# Patient Record
Sex: Female | Born: 1945 | Race: White | Hispanic: No | Smoking: Never smoker
Health system: Southern US, Community
[De-identification: ages and names within clinical notes are randomized; demographics above are authoritative.]

## PROBLEM LIST (undated history)

## (undated) ENCOUNTER — Emergency Department (HOSPITAL_COMMUNITY): Disposition: A | Discharge status: Home / Self Care | Payer: Medicare Other

## (undated) DIAGNOSIS — I739 Peripheral vascular disease, unspecified: Secondary | ICD-10-CM

## (undated) DIAGNOSIS — R569 Unspecified convulsions: Secondary | ICD-10-CM

## (undated) DIAGNOSIS — M1990 Unspecified osteoarthritis, unspecified site: Secondary | ICD-10-CM

## (undated) DIAGNOSIS — R0600 Dyspnea, unspecified: Secondary | ICD-10-CM

## (undated) DIAGNOSIS — D509 Iron deficiency anemia, unspecified: Secondary | ICD-10-CM

## (undated) DIAGNOSIS — E039 Hypothyroidism, unspecified: Secondary | ICD-10-CM

## (undated) DIAGNOSIS — L0390 Cellulitis, unspecified: Secondary | ICD-10-CM

## (undated) DIAGNOSIS — K219 Gastro-esophageal reflux disease without esophagitis: Secondary | ICD-10-CM

## (undated) DIAGNOSIS — D649 Anemia, unspecified: Secondary | ICD-10-CM

## (undated) DIAGNOSIS — R0609 Other forms of dyspnea: Secondary | ICD-10-CM

## (undated) DIAGNOSIS — D519 Vitamin B12 deficiency anemia, unspecified: Secondary | ICD-10-CM

## (undated) DIAGNOSIS — E785 Hyperlipidemia, unspecified: Secondary | ICD-10-CM

## (undated) DIAGNOSIS — G5603 Carpal tunnel syndrome, bilateral upper limbs: Secondary | ICD-10-CM

## (undated) DIAGNOSIS — I350 Nonrheumatic aortic (valve) stenosis: Secondary | ICD-10-CM

## (undated) DIAGNOSIS — I209 Angina pectoris, unspecified: Secondary | ICD-10-CM

## (undated) DIAGNOSIS — R011 Cardiac murmur, unspecified: Secondary | ICD-10-CM

## (undated) DIAGNOSIS — M19072 Primary osteoarthritis, left ankle and foot: Secondary | ICD-10-CM

## (undated) DIAGNOSIS — W5501XA Bitten by cat, initial encounter: Secondary | ICD-10-CM

## (undated) DIAGNOSIS — Z87440 Personal history of urinary (tract) infections: Secondary | ICD-10-CM

## (undated) DIAGNOSIS — I219 Acute myocardial infarction, unspecified: Secondary | ICD-10-CM

## (undated) DIAGNOSIS — M869 Osteomyelitis, unspecified: Secondary | ICD-10-CM

## (undated) DIAGNOSIS — Z8709 Personal history of other diseases of the respiratory system: Secondary | ICD-10-CM

## (undated) DIAGNOSIS — M349 Systemic sclerosis, unspecified: Secondary | ICD-10-CM

## (undated) DIAGNOSIS — I509 Heart failure, unspecified: Secondary | ICD-10-CM

## (undated) DIAGNOSIS — I2510 Atherosclerotic heart disease of native coronary artery without angina pectoris: Secondary | ICD-10-CM

## (undated) DIAGNOSIS — M19071 Primary osteoarthritis, right ankle and foot: Secondary | ICD-10-CM

## (undated) DIAGNOSIS — Z5189 Encounter for other specified aftercare: Secondary | ICD-10-CM

## (undated) DIAGNOSIS — E109 Type 1 diabetes mellitus without complications: Secondary | ICD-10-CM

## (undated) DIAGNOSIS — K259 Gastric ulcer, unspecified as acute or chronic, without hemorrhage or perforation: Secondary | ICD-10-CM

## (undated) DIAGNOSIS — C50919 Malignant neoplasm of unspecified site of unspecified female breast: Secondary | ICD-10-CM

## (undated) DIAGNOSIS — IMO0001 Reserved for inherently not codable concepts without codable children: Secondary | ICD-10-CM

## (undated) DIAGNOSIS — I10 Essential (primary) hypertension: Secondary | ICD-10-CM

## (undated) HISTORY — DX: Systemic sclerosis, unspecified: M34.9

## (undated) HISTORY — PX: CARPAL TUNNEL RELEASE: SHX101

## (undated) HISTORY — PX: CARDIAC CATHETERIZATION: SHX172

## (undated) HISTORY — DX: Heart failure, unspecified: I50.9

## (undated) HISTORY — DX: Unspecified osteoarthritis, unspecified site: M19.90

## (undated) HISTORY — DX: Carpal tunnel syndrome, bilateral upper limbs: G56.03

## (undated) HISTORY — DX: Personal history of urinary (tract) infections: Z87.440

## (undated) HISTORY — DX: Bitten by cat, initial encounter: W55.01XA

## (undated) HISTORY — DX: Hyperlipidemia, unspecified: E78.5

## (undated) HISTORY — DX: Unspecified convulsions: R56.9

## (undated) HISTORY — PX: TRANSMETATARSAL AMPUTATION: SHX6197

## (undated) HISTORY — DX: Cardiac murmur, unspecified: R01.1

## (undated) HISTORY — PX: INCISION AND DRAINAGE OF WOUND: SHX1803

## (undated) HISTORY — DX: Peripheral vascular disease, unspecified: I73.9

## (undated) HISTORY — DX: Essential (primary) hypertension: I10

## (undated) HISTORY — PX: VITRECTOMY: SHX106

## (undated) HISTORY — DX: Anemia, unspecified: D64.9

## (undated) HISTORY — DX: Nonrheumatic aortic (valve) stenosis: I35.0

## (undated) HISTORY — PX: FEMORAL BYPASS: SHX50

## (undated) HISTORY — DX: Atherosclerotic heart disease of native coronary artery without angina pectoris: I25.10

## (undated) HISTORY — DX: Acute myocardial infarction, unspecified: I21.9

## (undated) HISTORY — DX: Vitamin B12 deficiency anemia, unspecified: D51.9

## (undated) HISTORY — DX: Hypothyroidism, unspecified: E03.9

## (undated) HISTORY — PX: BREAST BIOPSY: SHX20

---
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
## 1979-06-16 HISTORY — PX: TRIGGER FINGER RELEASE: SHX641

## 1982-10-15 HISTORY — PX: BREAST BIOPSY WITH SENTINEL LYMPH NODE BIOPSY AND NEEDLE LOCALIZATION: SHX5761

## 1982-10-15 HISTORY — PX: TUBAL LIGATION: SHX77

## 1982-11-15 HISTORY — PX: MASTECTOMY: SHX3

## 1984-10-15 DIAGNOSIS — I219 Acute myocardial infarction, unspecified: Secondary | ICD-10-CM

## 1984-10-15 HISTORY — DX: Acute myocardial infarction, unspecified: I21.9

## 1989-06-15 HISTORY — PX: CATARACT EXTRACTION W/ INTRAOCULAR LENS  IMPLANT, BILATERAL: SHX1307

## 1989-10-15 HISTORY — PX: APPENDECTOMY: SHX54

## 1990-10-15 HISTORY — PX: CORONARY ARTERY BYPASS GRAFT: SHX141

## 2000-01-05 ENCOUNTER — Encounter: Admission: RE | Admit: 2000-01-05 | Discharge: 2000-04-04 | Payer: Self-pay | Admitting: Internal Medicine

## 2002-06-30 ENCOUNTER — Encounter: Admission: RE | Admit: 2002-06-30 | Discharge: 2002-06-30 | Payer: Self-pay | Admitting: *Deleted

## 2002-06-30 ENCOUNTER — Encounter: Payer: Self-pay | Admitting: *Deleted

## 2002-07-16 ENCOUNTER — Other Ambulatory Visit: Admission: RE | Admit: 2002-07-16 | Discharge: 2002-07-16 | Payer: Self-pay | Admitting: *Deleted

## 2003-06-06 ENCOUNTER — Emergency Department (HOSPITAL_COMMUNITY): Admission: EM | Admit: 2003-06-06 | Discharge: 2003-06-06 | Payer: Self-pay | Admitting: Emergency Medicine

## 2003-06-30 ENCOUNTER — Other Ambulatory Visit: Admission: RE | Admit: 2003-06-30 | Discharge: 2003-06-30 | Payer: Self-pay | Admitting: *Deleted

## 2003-12-09 ENCOUNTER — Ambulatory Visit (HOSPITAL_COMMUNITY): Admission: RE | Admit: 2003-12-09 | Discharge: 2003-12-09 | Payer: Self-pay | Admitting: Endocrinology

## 2004-03-06 ENCOUNTER — Ambulatory Visit (HOSPITAL_COMMUNITY): Admission: RE | Admit: 2004-03-06 | Discharge: 2004-03-06 | Payer: Self-pay | Admitting: Endocrinology

## 2004-10-10 ENCOUNTER — Encounter: Admission: RE | Admit: 2004-10-10 | Discharge: 2004-10-10 | Payer: Self-pay | Admitting: Orthopedic Surgery

## 2006-03-03 ENCOUNTER — Emergency Department (HOSPITAL_COMMUNITY): Admission: EM | Admit: 2006-03-03 | Discharge: 2006-03-03 | Payer: Self-pay | Admitting: Emergency Medicine

## 2006-03-05 ENCOUNTER — Ambulatory Visit: Payer: Self-pay | Admitting: Hospitalist

## 2006-03-06 ENCOUNTER — Inpatient Hospital Stay (HOSPITAL_COMMUNITY): Admission: EM | Admit: 2006-03-06 | Discharge: 2006-03-11 | Payer: Self-pay | Admitting: Emergency Medicine

## 2006-03-07 ENCOUNTER — Ambulatory Visit: Payer: Self-pay | Admitting: Internal Medicine

## 2006-03-14 ENCOUNTER — Ambulatory Visit: Payer: Self-pay | Admitting: Endocrinology

## 2006-03-25 ENCOUNTER — Encounter (HOSPITAL_BASED_OUTPATIENT_CLINIC_OR_DEPARTMENT_OTHER): Admission: RE | Admit: 2006-03-25 | Discharge: 2006-05-22 | Payer: Self-pay | Admitting: Surgery

## 2006-04-01 ENCOUNTER — Ambulatory Visit: Payer: Self-pay | Admitting: Endocrinology

## 2006-04-01 ENCOUNTER — Ambulatory Visit: Payer: Self-pay | Admitting: Cardiology

## 2006-04-04 ENCOUNTER — Ambulatory Visit: Payer: Self-pay | Admitting: Endocrinology

## 2006-04-05 ENCOUNTER — Ambulatory Visit: Payer: Self-pay

## 2006-04-05 ENCOUNTER — Encounter: Payer: Self-pay | Admitting: Cardiology

## 2006-04-10 ENCOUNTER — Ambulatory Visit: Payer: Self-pay | Admitting: *Deleted

## 2006-05-02 ENCOUNTER — Ambulatory Visit: Payer: Self-pay | Admitting: *Deleted

## 2006-05-15 ENCOUNTER — Ambulatory Visit: Payer: Self-pay | Admitting: Endocrinology

## 2006-05-31 ENCOUNTER — Ambulatory Visit: Payer: Self-pay | Admitting: Endocrinology

## 2006-06-12 ENCOUNTER — Ambulatory Visit: Payer: Self-pay

## 2006-06-12 ENCOUNTER — Ambulatory Visit: Payer: Self-pay | Admitting: *Deleted

## 2006-06-12 ENCOUNTER — Encounter: Payer: Self-pay | Admitting: Cardiology

## 2006-06-20 ENCOUNTER — Ambulatory Visit: Payer: Self-pay | Admitting: Endocrinology

## 2006-08-12 ENCOUNTER — Ambulatory Visit: Payer: Self-pay | Admitting: Endocrinology

## 2006-08-12 LAB — CONVERTED CEMR LAB
Hgb A1c MFr Bld: 11 % — ABNORMAL HIGH (ref 4.6–6.0)
Iron: 63 ug/dL (ref 42–145)
MCHC: 34.3 g/dL (ref 30.0–36.0)
MCV: 91.9 fL (ref 78.0–100.0)
Platelets: 268 10*3/uL (ref 150–400)
RBC: 3.96 M/uL (ref 3.87–5.11)
RDW: 13 % (ref 11.5–14.6)
Saturation Ratios: 19.7 % — ABNORMAL LOW (ref 20.0–50.0)
WBC: 6.1 10*3/uL (ref 4.5–10.5)

## 2007-03-26 ENCOUNTER — Ambulatory Visit: Payer: Self-pay | Admitting: *Deleted

## 2007-04-22 ENCOUNTER — Ambulatory Visit: Payer: Self-pay | Admitting: *Deleted

## 2007-04-22 LAB — CONVERTED CEMR LAB
ALT: 12 units/L (ref 0–35)
AST: 14 units/L (ref 0–37)
Alkaline Phosphatase: 75 units/L (ref 39–117)
BUN: 10 mg/dL (ref 6–23)
Bilirubin, Direct: 0.1 mg/dL (ref 0.0–0.3)
CO2: 31 meq/L (ref 19–32)
Calcium: 9.1 mg/dL (ref 8.4–10.5)
Chloride: 109 meq/L (ref 96–112)
Cholesterol: 222 mg/dL (ref 0–200)
Creatinine, Ser: 1 mg/dL (ref 0.4–1.2)
GFR calc Af Amer: 72 mL/min
GFR calc non Af Amer: 60 mL/min
Glucose, Bld: 147 mg/dL — ABNORMAL HIGH (ref 70–99)
HDL: 33 mg/dL — ABNORMAL LOW (ref >39.0)
Potassium: 3.6 meq/L (ref 3.5–5.1)
Sodium: 143 meq/L (ref 135–145)
Total Bilirubin: 0.8 mg/dL (ref 0.3–1.2)
Total CHOL/HDL Ratio: 6.7
Total Protein: 6.3 g/dL (ref 6.0–8.3)
Triglycerides: 300 mg/dL (ref 0–149)

## 2007-07-02 ENCOUNTER — Ambulatory Visit: Payer: Self-pay

## 2007-07-02 LAB — CONVERTED CEMR LAB
ALT: 20 units/L (ref 0–35)
AST: 24 units/L (ref 0–37)
Albumin: 3.4 g/dL — ABNORMAL LOW (ref 3.5–5.2)
Alkaline Phosphatase: 78 units/L (ref 39–117)
BUN: 14 mg/dL (ref 6–23)
Bilirubin, Direct: 0.2 mg/dL (ref 0.0–0.3)
CO2: 31 meq/L (ref 19–32)
Calcium: 9.3 mg/dL (ref 8.4–10.5)
Chloride: 106 meq/L (ref 96–112)
Cholesterol: 131 mg/dL (ref 0–200)
Creatinine, Ser: 1.1 mg/dL (ref 0.4–1.2)
GFR calc Af Amer: 65 mL/min
GFR calc non Af Amer: 53 mL/min
Glucose, Bld: 235 mg/dL — ABNORMAL HIGH (ref 70–99)
HDL: 36.5 mg/dL — ABNORMAL LOW (ref >39.0)
LDL Cholesterol: 71 mg/dL (ref 0–99)
Potassium: 4.3 meq/L (ref 3.5–5.1)
Sodium: 142 meq/L (ref 135–145)
Total Bilirubin: 1 mg/dL (ref 0.3–1.2)
Total CHOL/HDL Ratio: 3.6
Total Protein: 6.6 g/dL (ref 6.0–8.3)
Triglycerides: 117 mg/dL (ref 0–149)
VLDL: 23 mg/dL (ref 0–40)

## 2007-07-07 ENCOUNTER — Ambulatory Visit: Payer: Self-pay | Admitting: Internal Medicine

## 2007-10-16 DIAGNOSIS — W5501XA Bitten by cat, initial encounter: Secondary | ICD-10-CM

## 2007-10-16 HISTORY — DX: Bitten by cat, initial encounter: W55.01XA

## 2007-10-16 HISTORY — PX: INCISION AND DRAINAGE OF WOUND: SHX1803

## 2007-12-26 ENCOUNTER — Ambulatory Visit: Payer: Self-pay | Admitting: Internal Medicine

## 2007-12-26 LAB — CONVERTED CEMR LAB
ALT: 17 units/L (ref 0–35)
AST: 21 units/L (ref 0–37)
Basophils Relative: 0.6 % (ref 0.0–1.0)
CO2: 28 meq/L (ref 19–32)
Calcium: 9.4 mg/dL (ref 8.4–10.5)
Chloride: 106 meq/L (ref 96–112)
Creatinine, Ser: 1 mg/dL (ref 0.4–1.2)
Eosinophils Absolute: 0.3 10*3/uL (ref 0.0–0.6)
Eosinophils Relative: 4.2 % (ref 0.0–5.0)
GFR calc non Af Amer: 60 mL/min
Glucose, Bld: 83 mg/dL (ref 70–99)
HCT: 36.6 % (ref 36.0–46.0)
Hemoglobin: 12.5 g/dL (ref 12.0–15.0)
MCV: 89.5 fL (ref 78.0–100.0)
Monocytes Absolute: 0.7 10*3/uL (ref 0.2–0.7)
Monocytes Relative: 10.2 % (ref 3.0–11.0)
Neutro Abs: 4.6 10*3/uL (ref 1.4–7.7)
Neutrophils Relative %: 66.7 % (ref 43.0–77.0)
Platelets: 266 10*3/uL (ref 150–400)
Potassium: 3.6 meq/L (ref 3.5–5.1)
RBC: 4.09 M/uL (ref 3.87–5.11)
RDW: 12.6 % (ref 11.5–14.6)
WBC: 6.9 10*3/uL (ref 4.5–10.5)

## 2008-04-26 ENCOUNTER — Ambulatory Visit: Payer: Self-pay | Admitting: Cardiovascular Disease

## 2008-05-06 ENCOUNTER — Ambulatory Visit: Payer: Self-pay

## 2008-05-07 ENCOUNTER — Ambulatory Visit: Payer: Self-pay

## 2008-05-07 ENCOUNTER — Ambulatory Visit: Payer: Self-pay | Admitting: Cardiovascular Disease

## 2008-05-07 LAB — CONVERTED CEMR LAB
BUN: 22 mg/dL (ref 6–23)
CO2: 31 meq/L (ref 19–32)
Calcium: 9.1 mg/dL (ref 8.4–10.5)
Chloride: 98 meq/L (ref 96–112)
Creatinine, Ser: 1.2 mg/dL (ref 0.4–1.2)
GFR calc Af Amer: 59 mL/min
GFR calc non Af Amer: 48 mL/min
Glucose, Bld: 219 mg/dL — ABNORMAL HIGH (ref 70–99)
Potassium: 4.3 meq/L (ref 3.5–5.1)
Sodium: 136 meq/L (ref 135–145)

## 2008-05-12 ENCOUNTER — Ambulatory Visit: Payer: Self-pay | Admitting: Cardiovascular Disease

## 2008-05-30 ENCOUNTER — Emergency Department (HOSPITAL_COMMUNITY): Admission: EM | Admit: 2008-05-30 | Discharge: 2008-05-30 | Payer: Self-pay | Admitting: Family Medicine

## 2008-06-12 ENCOUNTER — Inpatient Hospital Stay (HOSPITAL_COMMUNITY): Admission: RE | Admit: 2008-06-12 | Discharge: 2008-06-14 | Payer: Self-pay | Admitting: Orthopedic Surgery

## 2008-06-23 ENCOUNTER — Inpatient Hospital Stay (HOSPITAL_COMMUNITY): Admission: RE | Admit: 2008-06-23 | Discharge: 2008-06-29 | Payer: Self-pay | Admitting: Orthopedic Surgery

## 2008-10-15 HISTORY — PX: CARDIAC CATHETERIZATION: SHX172

## 2008-12-20 ENCOUNTER — Ambulatory Visit: Payer: Self-pay | Admitting: Internal Medicine

## 2008-12-21 ENCOUNTER — Ambulatory Visit: Payer: Self-pay

## 2008-12-21 ENCOUNTER — Encounter: Payer: Self-pay | Admitting: Internal Medicine

## 2008-12-30 ENCOUNTER — Ambulatory Visit: Payer: Self-pay

## 2009-01-07 ENCOUNTER — Ambulatory Visit: Payer: Self-pay | Admitting: Cardiology

## 2009-01-07 LAB — CONVERTED CEMR LAB
ALT: 27 units/L (ref 0–35)
AST: 23 units/L (ref 0–37)
Albumin: 3.2 g/dL — ABNORMAL LOW (ref 3.5–5.2)
Alkaline Phosphatase: 84 units/L (ref 39–117)
BUN: 25 mg/dL — ABNORMAL HIGH (ref 6–23)
Basophils Absolute: 0 10*3/uL (ref 0.0–0.1)
Basophils Relative: 0 % (ref 0.0–3.0)
Bilirubin, Direct: 0.1 mg/dL (ref 0.0–0.3)
CO2: 32 meq/L (ref 19–32)
Calcium: 9.1 mg/dL (ref 8.4–10.5)
Chloride: 105 meq/L (ref 96–112)
Creatinine, Ser: 1.3 mg/dL — ABNORMAL HIGH (ref 0.4–1.2)
Eosinophils Absolute: 2.3 10*3/uL — ABNORMAL HIGH (ref 0.0–0.7)
Eosinophils Relative: 21.5 % — ABNORMAL HIGH (ref 0.0–5.0)
GFR calc non Af Amer: 43.9 mL/min (ref >60)
Glucose, Bld: 205 mg/dL — ABNORMAL HIGH (ref 70–99)
HCT: 36.9 % (ref 36.0–46.0)
Hemoglobin: 12.7 g/dL (ref 12.0–15.0)
INR: 1 (ref 0.8–1.0)
Lymphocytes Relative: 14.2 % (ref 12.0–46.0)
Lymphs Abs: 1.5 10*3/uL (ref 0.7–4.0)
MCHC: 34.3 g/dL (ref 30.0–36.0)
MCV: 90.1 fL (ref 78.0–100.0)
Monocytes Absolute: 1 10*3/uL (ref 0.1–1.0)
Monocytes Relative: 9.4 % (ref 3.0–12.0)
Neutro Abs: 5.8 10*3/uL (ref 1.4–7.7)
Neutrophils Relative %: 54.9 % (ref 43.0–77.0)
Platelets: 199 10*3/uL (ref 150.0–400.0)
Potassium: 4.2 meq/L (ref 3.5–5.1)
Prothrombin Time: 10.7 s — ABNORMAL LOW (ref 10.9–13.3)
RBC: 4.1 M/uL (ref 3.87–5.11)
RDW: 13.6 % (ref 11.5–14.6)
Sodium: 141 meq/L (ref 135–145)
TSH: 0.56 microintl units/mL (ref 0.35–5.50)
Total Bilirubin: 1.2 mg/dL (ref 0.3–1.2)
Total Protein: 6.5 g/dL (ref 6.0–8.3)
WBC: 10.6 10*3/uL — ABNORMAL HIGH (ref 4.5–10.5)
aPTT: 30.3 s — ABNORMAL HIGH (ref 21.7–28.8)

## 2009-01-13 ENCOUNTER — Inpatient Hospital Stay (HOSPITAL_BASED_OUTPATIENT_CLINIC_OR_DEPARTMENT_OTHER): Admission: RE | Admit: 2009-01-13 | Discharge: 2009-01-13 | Payer: Self-pay | Admitting: Cardiology

## 2009-01-13 ENCOUNTER — Ambulatory Visit: Payer: Self-pay | Admitting: Cardiology

## 2009-02-01 DIAGNOSIS — E785 Hyperlipidemia, unspecified: Secondary | ICD-10-CM

## 2009-02-01 DIAGNOSIS — I10 Essential (primary) hypertension: Secondary | ICD-10-CM

## 2009-02-01 DIAGNOSIS — E039 Hypothyroidism, unspecified: Secondary | ICD-10-CM | POA: Insufficient documentation

## 2009-02-03 ENCOUNTER — Ambulatory Visit: Payer: Self-pay | Admitting: Internal Medicine

## 2009-02-03 ENCOUNTER — Encounter: Payer: Self-pay | Admitting: Internal Medicine

## 2009-02-03 DIAGNOSIS — I2510 Atherosclerotic heart disease of native coronary artery without angina pectoris: Secondary | ICD-10-CM

## 2009-02-03 DIAGNOSIS — I6529 Occlusion and stenosis of unspecified carotid artery: Secondary | ICD-10-CM

## 2009-02-03 DIAGNOSIS — E1165 Type 2 diabetes mellitus with hyperglycemia: Secondary | ICD-10-CM

## 2009-02-03 DIAGNOSIS — E1151 Type 2 diabetes mellitus with diabetic peripheral angiopathy without gangrene: Secondary | ICD-10-CM

## 2009-02-16 ENCOUNTER — Ambulatory Visit: Payer: Self-pay

## 2009-02-16 ENCOUNTER — Encounter: Payer: Self-pay | Admitting: Internal Medicine

## 2009-04-04 ENCOUNTER — Encounter (HOSPITAL_BASED_OUTPATIENT_CLINIC_OR_DEPARTMENT_OTHER): Admission: RE | Admit: 2009-04-04 | Discharge: 2009-04-26 | Payer: Self-pay | Admitting: General Surgery

## 2009-04-05 ENCOUNTER — Telehealth: Payer: Self-pay | Admitting: Internal Medicine

## 2009-04-06 ENCOUNTER — Ambulatory Visit: Payer: Self-pay | Admitting: Vascular Surgery

## 2009-04-11 ENCOUNTER — Ambulatory Visit: Payer: Self-pay | Admitting: Vascular Surgery

## 2009-04-11 ENCOUNTER — Ambulatory Visit (HOSPITAL_COMMUNITY): Admission: RE | Admit: 2009-04-11 | Discharge: 2009-04-11 | Payer: Self-pay | Admitting: Vascular Surgery

## 2009-04-11 ENCOUNTER — Telehealth: Payer: Self-pay | Admitting: Internal Medicine

## 2009-04-13 ENCOUNTER — Ambulatory Visit: Payer: Self-pay | Admitting: Vascular Surgery

## 2009-04-19 ENCOUNTER — Inpatient Hospital Stay (HOSPITAL_COMMUNITY): Admission: RE | Admit: 2009-04-19 | Discharge: 2009-04-26 | Payer: Self-pay | Admitting: Vascular Surgery

## 2009-04-19 ENCOUNTER — Ambulatory Visit: Payer: Self-pay | Admitting: Cardiology

## 2009-04-19 ENCOUNTER — Encounter: Payer: Self-pay | Admitting: Vascular Surgery

## 2009-04-20 ENCOUNTER — Encounter: Payer: Self-pay | Admitting: Cardiology

## 2009-04-21 ENCOUNTER — Ambulatory Visit: Payer: Self-pay | Admitting: Vascular Surgery

## 2009-04-21 ENCOUNTER — Encounter: Payer: Self-pay | Admitting: Vascular Surgery

## 2009-05-06 ENCOUNTER — Telehealth: Payer: Self-pay | Admitting: Cardiovascular Disease

## 2009-05-11 ENCOUNTER — Emergency Department (HOSPITAL_COMMUNITY): Admission: EM | Admit: 2009-05-11 | Discharge: 2009-05-11 | Payer: Self-pay | Admitting: Emergency Medicine

## 2009-05-11 ENCOUNTER — Ambulatory Visit: Payer: Self-pay | Admitting: Vascular Surgery

## 2009-05-18 ENCOUNTER — Ambulatory Visit: Payer: Self-pay | Admitting: Vascular Surgery

## 2009-05-27 ENCOUNTER — Ambulatory Visit: Payer: Self-pay | Admitting: Vascular Surgery

## 2009-06-03 ENCOUNTER — Ambulatory Visit: Payer: Self-pay | Admitting: Vascular Surgery

## 2009-06-06 ENCOUNTER — Ambulatory Visit: Payer: Self-pay | Admitting: Internal Medicine

## 2009-06-06 DIAGNOSIS — D599 Acquired hemolytic anemia, unspecified: Secondary | ICD-10-CM

## 2009-06-07 LAB — CONVERTED CEMR LAB
BUN: 30 mg/dL — ABNORMAL HIGH (ref 6–23)
Basophils Absolute: 0 10*3/uL (ref 0.0–0.1)
Basophils Relative: 0.1 % (ref 0.0–3.0)
CO2: 28 meq/L (ref 19–32)
Calcium: 9 mg/dL (ref 8.4–10.5)
Chloride: 100 meq/L (ref 96–112)
Creatinine, Ser: 1.3 mg/dL — ABNORMAL HIGH (ref 0.4–1.2)
Eosinophils Absolute: 0.4 10*3/uL (ref 0.0–0.7)
Eosinophils Relative: 4.2 % (ref 0.0–5.0)
Ferritin: 7.4 ng/mL — ABNORMAL LOW (ref 10.0–291.0)
GFR calc non Af Amer: 43.84 mL/min (ref >60)
Glucose, Bld: 325 mg/dL — ABNORMAL HIGH (ref 70–99)
HCT: 24.3 % — ABNORMAL LOW (ref 36.0–46.0)
Hemoglobin: 8 g/dL — ABNORMAL LOW (ref 12.0–15.0)
INR: 1 (ref 0.8–1.0)
Iron: 145 ug/dL (ref 42–145)
Lymphocytes Relative: 13.1 % (ref 12.0–46.0)
Lymphs Abs: 1.1 10*3/uL (ref 0.7–4.0)
MCHC: 32.9 g/dL (ref 30.0–36.0)
MCV: 82.4 fL (ref 78.0–100.0)
Monocytes Absolute: 0.9 10*3/uL (ref 0.1–1.0)
Monocytes Relative: 10.8 % (ref 3.0–12.0)
Neutro Abs: 6.3 10*3/uL (ref 1.4–7.7)
Neutrophils Relative %: 71.8 % (ref 43.0–77.0)
Platelets: 299 10*3/uL (ref 150.0–400.0)
Potassium: 5 meq/L (ref 3.5–5.1)
Pro B Natriuretic peptide (BNP): 406 pg/mL — ABNORMAL HIGH (ref 0.0–100.0)
Prothrombin Time: 10.7 s (ref 9.1–11.7)
RBC: 2.93 M/uL — ABNORMAL LOW (ref 3.87–5.11)
RDW: 15.4 % — ABNORMAL HIGH (ref 11.5–14.6)
Saturation Ratios: 39 % (ref 20.0–50.0)
Sodium: 134 meq/L — ABNORMAL LOW (ref 135–145)
Transferrin: 265.8 mg/dL (ref 212.0–360.0)
WBC: 8.7 10*3/uL (ref 4.5–10.5)
aPTT: 28.9 s — ABNORMAL HIGH (ref 21.7–28.8)

## 2009-06-08 ENCOUNTER — Telehealth (INDEPENDENT_AMBULATORY_CARE_PROVIDER_SITE_OTHER): Payer: Self-pay | Admitting: *Deleted

## 2009-06-09 ENCOUNTER — Ambulatory Visit: Payer: Self-pay | Admitting: Vascular Surgery

## 2009-06-09 ENCOUNTER — Ambulatory Visit (HOSPITAL_COMMUNITY): Admission: RE | Admit: 2009-06-09 | Discharge: 2009-06-09 | Payer: Self-pay | Admitting: Vascular Surgery

## 2009-06-17 ENCOUNTER — Ambulatory Visit: Payer: Self-pay | Admitting: Vascular Surgery

## 2009-06-22 ENCOUNTER — Ambulatory Visit: Payer: Self-pay | Admitting: Internal Medicine

## 2009-06-24 ENCOUNTER — Ambulatory Visit: Payer: Self-pay | Admitting: Vascular Surgery

## 2009-06-27 ENCOUNTER — Telehealth: Payer: Self-pay | Admitting: Internal Medicine

## 2009-06-27 ENCOUNTER — Ambulatory Visit: Payer: Self-pay | Admitting: Surgery

## 2009-06-27 ENCOUNTER — Inpatient Hospital Stay (HOSPITAL_COMMUNITY): Admission: AD | Admit: 2009-06-27 | Discharge: 2009-07-08 | Payer: Self-pay | Admitting: Surgery

## 2009-06-27 LAB — CONVERTED CEMR LAB
Basophils Absolute: 0.1 10*3/uL (ref 0.0–0.1)
Basophils Relative: 0.6 % (ref 0.0–3.0)
Eosinophils Absolute: 0.3 10*3/uL (ref 0.0–0.7)
Eosinophils Relative: 2.9 % (ref 0.0–5.0)
HCT: 26.7 % — ABNORMAL LOW (ref 36.0–46.0)
Hemoglobin: 8.6 g/dL — ABNORMAL LOW (ref 12.0–15.0)
Lymphocytes Relative: 10.8 % — ABNORMAL LOW (ref 12.0–46.0)
Lymphs Abs: 1.1 10*3/uL (ref 0.7–4.0)
MCHC: 32.3 g/dL (ref 30.0–36.0)
MCV: 84.5 fL (ref 78.0–100.0)
Monocytes Absolute: 0.2 10*3/uL (ref 0.1–1.0)
Monocytes Relative: 2.4 % — ABNORMAL LOW (ref 3.0–12.0)
Neutro Abs: 8.7 10*3/uL — ABNORMAL HIGH (ref 1.4–7.7)
Neutrophils Relative %: 83.3 % — ABNORMAL HIGH (ref 43.0–77.0)
Platelets: 376 10*3/uL (ref 150.0–400.0)
RBC: 3.16 M/uL — ABNORMAL LOW (ref 3.87–5.11)
RDW: 18.2 % — ABNORMAL HIGH (ref 11.5–14.6)
TSH: 0.14 microintl units/mL — ABNORMAL LOW (ref 0.35–5.50)
WBC: 10.4 10*3/uL (ref 4.5–10.5)

## 2009-06-28 ENCOUNTER — Telehealth: Payer: Self-pay | Admitting: Internal Medicine

## 2009-06-30 ENCOUNTER — Encounter: Payer: Self-pay | Admitting: Surgery

## 2009-07-06 ENCOUNTER — Ambulatory Visit: Payer: Self-pay | Admitting: Infectious Diseases

## 2009-07-22 ENCOUNTER — Encounter: Payer: Self-pay | Admitting: Cardiovascular Disease

## 2009-07-22 ENCOUNTER — Ambulatory Visit: Payer: Self-pay | Admitting: Vascular Surgery

## 2009-08-12 ENCOUNTER — Ambulatory Visit: Payer: Self-pay | Admitting: Vascular Surgery

## 2009-09-05 ENCOUNTER — Ambulatory Visit: Payer: Self-pay | Admitting: Vascular Surgery

## 2009-09-16 ENCOUNTER — Ambulatory Visit: Payer: Self-pay | Admitting: Vascular Surgery

## 2009-09-23 ENCOUNTER — Ambulatory Visit: Payer: Self-pay | Admitting: Vascular Surgery

## 2009-09-26 ENCOUNTER — Ambulatory Visit: Payer: Self-pay | Admitting: Internal Medicine

## 2009-10-04 ENCOUNTER — Ambulatory Visit: Payer: Self-pay | Admitting: Vascular Surgery

## 2009-10-10 ENCOUNTER — Ambulatory Visit: Payer: Self-pay | Admitting: Vascular Surgery

## 2009-10-21 ENCOUNTER — Ambulatory Visit: Payer: Self-pay | Admitting: Vascular Surgery

## 2009-10-25 ENCOUNTER — Encounter: Payer: Self-pay | Admitting: Vascular Surgery

## 2009-10-25 ENCOUNTER — Inpatient Hospital Stay (HOSPITAL_COMMUNITY): Admission: RE | Admit: 2009-10-25 | Discharge: 2009-10-26 | Payer: Self-pay | Admitting: Vascular Surgery

## 2009-10-25 ENCOUNTER — Ambulatory Visit: Payer: Self-pay | Admitting: Vascular Surgery

## 2009-11-01 ENCOUNTER — Ambulatory Visit (HOSPITAL_COMMUNITY): Admission: RE | Admit: 2009-11-01 | Discharge: 2009-11-01 | Payer: Self-pay | Admitting: Vascular Surgery

## 2009-11-04 ENCOUNTER — Ambulatory Visit: Payer: Self-pay | Admitting: Vascular Surgery

## 2009-11-04 ENCOUNTER — Encounter: Payer: Self-pay | Admitting: Internal Medicine

## 2009-11-10 ENCOUNTER — Encounter (INDEPENDENT_AMBULATORY_CARE_PROVIDER_SITE_OTHER): Payer: Self-pay | Admitting: *Deleted

## 2009-11-17 ENCOUNTER — Ambulatory Visit: Payer: Self-pay | Admitting: Internal Medicine

## 2009-11-18 ENCOUNTER — Ambulatory Visit: Payer: Self-pay | Admitting: Vascular Surgery

## 2009-11-22 LAB — CONVERTED CEMR LAB
AST: 20 units/L (ref 0–37)
BUN: 11 mg/dL (ref 6–23)
Basophils Absolute: 0 10*3/uL (ref 0.0–0.1)
Basophils Relative: 0 % (ref 0.0–3.0)
CO2: 31 meq/L (ref 19–32)
Calcium: 9 mg/dL (ref 8.4–10.5)
Chloride: 102 meq/L (ref 96–112)
Cholesterol: 114 mg/dL (ref 0–200)
Creatinine, Ser: 0.9 mg/dL (ref 0.4–1.2)
Eosinophils Absolute: 1.3 10*3/uL — ABNORMAL HIGH (ref 0.0–0.7)
Eosinophils Relative: 18.5 % — ABNORMAL HIGH (ref 0.0–5.0)
GFR calc non Af Amer: 66.92 mL/min (ref >60)
Glucose, Bld: 128 mg/dL — ABNORMAL HIGH (ref 70–99)
HCT: 32.6 % — ABNORMAL LOW (ref 36.0–46.0)
HDL: 46.1 mg/dL (ref >39.00)
Hemoglobin: 10.7 g/dL — ABNORMAL LOW (ref 12.0–15.0)
Iron: 42 ug/dL (ref 42–145)
LDL Cholesterol: 52 mg/dL (ref 0–99)
Lymphocytes Relative: 15.3 % (ref 12.0–46.0)
Lymphs Abs: 1.1 10*3/uL (ref 0.7–4.0)
MCHC: 32.8 g/dL (ref 30.0–36.0)
MCV: 91.3 fL (ref 78.0–100.0)
Monocytes Absolute: 0.7 10*3/uL (ref 0.1–1.0)
Monocytes Relative: 10.2 % (ref 3.0–12.0)
Neutro Abs: 3.9 10*3/uL (ref 1.4–7.7)
Neutrophils Relative %: 56 % (ref 43.0–77.0)
Platelets: 219 10*3/uL (ref 150.0–400.0)
Potassium: 3.5 meq/L (ref 3.5–5.1)
RBC: 3.57 M/uL — ABNORMAL LOW (ref 3.87–5.11)
RDW: 14.8 % — ABNORMAL HIGH (ref 11.5–14.6)
Sodium: 140 meq/L (ref 135–145)
Total CHOL/HDL Ratio: 2
Triglycerides: 81 mg/dL (ref 0.0–149.0)
VLDL: 16.2 mg/dL (ref 0.0–40.0)
WBC: 7 10*3/uL (ref 4.5–10.5)

## 2009-12-02 ENCOUNTER — Encounter: Payer: Self-pay | Admitting: Internal Medicine

## 2009-12-02 ENCOUNTER — Ambulatory Visit: Payer: Self-pay | Admitting: Vascular Surgery

## 2009-12-30 ENCOUNTER — Ambulatory Visit: Payer: Self-pay | Admitting: Vascular Surgery

## 2010-01-20 ENCOUNTER — Ambulatory Visit: Payer: Self-pay | Admitting: Vascular Surgery

## 2010-02-02 ENCOUNTER — Encounter: Payer: Self-pay | Admitting: Internal Medicine

## 2010-02-17 ENCOUNTER — Telehealth: Payer: Self-pay | Admitting: Internal Medicine

## 2010-02-24 ENCOUNTER — Ambulatory Visit: Payer: Self-pay | Admitting: Vascular Surgery

## 2010-04-24 ENCOUNTER — Ambulatory Visit: Payer: Self-pay | Admitting: Internal Medicine

## 2010-04-25 LAB — CONVERTED CEMR LAB
BUN: 17 mg/dL (ref 6–23)
Basophils Absolute: 0.1 10*3/uL (ref 0.0–0.1)
Basophils Relative: 0.9 % (ref 0.0–3.0)
CO2: 28 meq/L (ref 19–32)
Calcium: 8.7 mg/dL (ref 8.4–10.5)
Chloride: 108 meq/L (ref 96–112)
Creatinine, Ser: 1.1 mg/dL (ref 0.4–1.2)
Eosinophils Absolute: 0.3 10*3/uL (ref 0.0–0.7)
Eosinophils Relative: 5 % (ref 0.0–5.0)
GFR calc non Af Amer: 55.94 mL/min (ref >60)
Glucose, Bld: 50 mg/dL — ABNORMAL LOW (ref 70–99)
HCT: 33.8 % — ABNORMAL LOW (ref 36.0–46.0)
Hemoglobin: 11.4 g/dL — ABNORMAL LOW (ref 12.0–15.0)
Lymphocytes Relative: 19.6 % (ref 12.0–46.0)
Lymphs Abs: 1.2 10*3/uL (ref 0.7–4.0)
MCHC: 33.6 g/dL (ref 30.0–36.0)
MCV: 86.6 fL (ref 78.0–100.0)
Monocytes Absolute: 0.7 10*3/uL (ref 0.1–1.0)
Monocytes Relative: 11.7 % (ref 3.0–12.0)
Neutro Abs: 3.7 10*3/uL (ref 1.4–7.7)
Neutrophils Relative %: 62.8 % (ref 43.0–77.0)
Platelets: 235 10*3/uL (ref 150.0–400.0)
Potassium: 3.9 meq/L (ref 3.5–5.1)
Pro B Natriuretic peptide (BNP): 421.6 pg/mL — ABNORMAL HIGH (ref 0.0–100.0)
RBC: 3.91 M/uL (ref 3.87–5.11)
RDW: 16.7 % — ABNORMAL HIGH (ref 11.5–14.6)
Sodium: 142 meq/L (ref 135–145)
TSH: 32.21 microintl units/mL — ABNORMAL HIGH (ref 0.35–5.50)
WBC: 5.9 10*3/uL (ref 4.5–10.5)

## 2010-04-26 ENCOUNTER — Telehealth: Payer: Self-pay | Admitting: Internal Medicine

## 2010-04-28 ENCOUNTER — Encounter: Payer: Self-pay | Admitting: Internal Medicine

## 2010-05-05 ENCOUNTER — Encounter: Payer: Self-pay | Admitting: Internal Medicine

## 2010-05-05 ENCOUNTER — Ambulatory Visit: Payer: Self-pay | Admitting: Vascular Surgery

## 2010-11-04 ENCOUNTER — Encounter: Payer: Self-pay | Admitting: Endocrinology

## 2010-11-14 NOTE — Letter (Signed)
Summary: [facility]   [facility]   Imported By: Roderic Ovens 12/22/2009 14:33:17  _____________________________________________________________________  External Attachment:    Type:   Image     Comment:   External Document

## 2010-11-14 NOTE — Progress Notes (Signed)
Summary: medication not working  Phone Note Call from Patient Call back at [phone]   Caller: Patient Reason for Call: Talk to Nurse, Talk to Doctor Summary of Call: pt has a medication change and she wants to discuss the lasix because it is not working Initial call taken by: Omer Jack,  April 26, 2010 11:50 AM  Follow-up for Phone Call        Spoke with pt who reports when she saw [name] on 7/11 Lasix was increased from 40 mg daily to 60 mg daily.  She took increased dose yesterday AM and was "miserable all day". States she had difficulty getting to bathroom due to urgency and it was very difficult for her to work.  Pt reports no change in SOB, wt or swelling since seeing [name].  Asking if she can change to Lasix 40 mg by mouth two times a day as she has done in past when needed.  I instructed pt to take Lasix 40 mg in AM and half 40 mg tab in afternoon to see if this would help with urgency. I asked her to let us know if this did not work. Pt agreeable with plan.  She is aware of scheduled follow up BMP. Follow-up by: Dossie Arbour, RN, BSN,  April 26, 2010 12:18 PM

## 2010-11-14 NOTE — Letter (Signed)
Summary: [facility]   [facility]   Imported By: Roderic Ovens 12/07/2009 09:45:38  _____________________________________________________________________  External Attachment:    Type:   Image     Comment:   External Document

## 2010-11-14 NOTE — Progress Notes (Signed)
  Phone Note Call from Patient Call back at [facility] Phone [phone]   Caller: Patient Summary of Call: pt needs scripts sent to [facility] [phone]. please [facility]aramacy [facility] [place] IN THE COMPUTER in EMR. pt very upset. Initial call taken by: Edman Circle,  Feb 17, 2010 12:19 PM  Follow-up for Phone Call        Sent meds to correct pharmacy. Follow-up by: J REISS RN    Prescriptions: ZETIA 10 MG TABS (EZETIMIBE) Take 1 tablet by mouth once a day  #30 x 6   Entered by:   Layne Benton, RN, BSN   Authorized by:   Sherrill Raring, MD, [facility]   Signed by:   Layne Benton, RN, BSN on 02/17/2010   Method used:   Electronically to        [facility] [phone]* (retail)       [address] [place], [place]  [address]       Ph: [phone] or [phone]       Fax: [phone]   RxID:   [id number] LIPITOR 40 MG TABS (ATORVASTATIN CALCIUM) Take 1 tablet by mouth at bedtime  #30 x 6   Entered by:   Layne Benton, RN, BSN   Authorized by:   Sherrill Raring, MD, [facility]   Signed by:   Layne Benton, RN, BSN on 02/17/2010   Method used:   Electronically to        [facility] [phone]* (retail)       [address] [place], [place]  [address]       Ph: [phone] or [phone]       Fax: [phone]   RxID:   [id number]

## 2010-11-14 NOTE — Letter (Signed)
Summary: [facility] Progress Note  [facility] Progress Note   Imported By: Roderic Ovens 03/09/2010 15:20:27  _____________________________________________________________________  External Attachment:    Type:   Image     Comment:   External Document

## 2010-11-14 NOTE — Assessment & Plan Note (Signed)
Summary: eph/per dr wall/jss   Primary Provider:  dr. Talmage Nap    History of Present Illness: Patient is a 65 year old with a history of CAD.  (last catheterizaiont:  LIMA to LAD patient; SVG to OM 100%, SVG to LCx patent patient but Cx with 70 to 80% narroing).  I last saw her in clinic 09/23/2009 Since seen, she has undergone surgery on her foot.  She was started on IV ABx through a PICC line a few wks ago (Primaxin q 6 hrs).  It was after starting this that she noted occasional chest pressure, not associated with activity.  SHe is able to walk stairs without a problem.  She does say thart if she really hurries with walking she will have it.  No episodes wake her from sleep.  She says the spells are different from her previous angina.  Current Medications (verified): 1)  Accupril 10 Mg Tabs (Quinapril Hcl) .... Take 1 Tablet By Mouth Once A Day 2)  Lantus 100 Unit/ml Soln (Insulin Glargine) .... Use As Directed 3)  Lipitor 40 Mg Tabs (Atorvastatin Calcium) .... Take 1 Tablet By Mouth At Bedtime 4)  Metoprolol Succinate 25 Mg Xr24h-Tab (Metoprolol Succinate) .... Take 1 Tablet By Mouth Once A Day 5)  Zetia 10 Mg Tabs (Ezetimibe) .... Take 1 Tablet By Mouth Once A Day 6)  Aspirin Ec 325 Mg Tbec (Aspirin) .... Take One Tablet By Mouth Daily 7)  Humalog Pen 100 Unit/ml Soln (Insulin Lispro (Human)) .... Sliding Scale 8)  Furosemide 40 Mg Tabs (Furosemide) .[name].. 1 Tab Once Daily 9)  Levothroid 88 Mcg Tabs (Levothyroxine Sodium) .[name].. 1 Tab Once Daily 10)  Neurontin 100 Mg Caps (Gabapentin) .[name].. 1 Cap As Needed 11)  Colace .... Two Times A Day 12)  Primaxin Iv 250-250 Mg Solr (Imipenem-Cilastatin) .... Qid  Allergies (verified): 1)  ! Keflex 2)  ! Cipro 3)  ! Adhesive Tape  Past History:  Past Medical History: Last updated: 09/26/2009 Current Problems:  HYPERTENSION, UNSPECIFIED (ICD-401.9) DYSLIPIDEMIA (ICD-272.4) UNSPECIFIED HYPOTHYROIDISM (ICD-244.9)   1.  Diabetes type 2.  2.   Recurrent UTIs.  3.  Hypothyroidism.  4.  Coronary artery disease status post CABG x3 in 1993.  5.  Scleroderma.  6.  Appendectomy in 1991.  7.  Bilateral carpal tunnel syndrome in the 1970s.  8.  Dyslipidemia.  9.  PVOD  Social History: Last updated: 11/10/2009 She does not smoke or use alcohol.  She works as an   Environmental health practitioner.   Vital Signs:  Patient profile:   65 year old female Height:      63 inches Weight:      171 pounds BMI:     30.40 Pulse rate:   79 / minute Resp:     16 per minute BP sitting:   171 / 78  (left arm)  Vitals Entered By: Marrion Coy, CNA (November 17, 2009 9:16 AM)  Physical Exam  General:  Well developed, well nourished, in no acute distress. Head:  normocephalic and atraumatic Neck:  JVP is normal. Lungs:  CTA.  No rales or wheezes. Heart:  RRR.  S1, S2.  No S3.  No murmurs. Abdomen:  Supple.  No hepatomegaly. Extremities:  L foot in boot.  No edema on R.   EKG  Procedure date:  11/17/2009  Findings:      NSR.  79 bpm.  Impression & Recommendations:  Problem # 1:  CAD (ICD-414.00) I am not convinced the patient's  symptoms are cardiac.  Began with ABx.  Question side effect.  She was quite active last week at [place] skating [place] and denied any shortness of breath, no reproducible discomfort with activity. I have told ther to keep on the same medicines.  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. May be finishing Abx soon.  Problem # 3:  HYPERTENSION, UNSPECIFIED (ICD-401.9) BP is up here.  She said it is usally lower.  She has been hustling .  Will follow.  Problem # 4:  DYSLIPIDEMIA (ICD-272.4) Continue lipitor and Zetia.  Check lipid and AST.  Other Orders: TLB-AST (SGOT) (84450-SGOT) TLB-BMP (Basic Metabolic Panel-BMET) (80048-METABOL) TLB-CBC Platelet - w/Differential (85025-CBCD) TLB-Lipid Panel (80061-LIPID) TLB-Iron, (Fe) Total (83540-FE)  Patient  Instructions: 1)  Your physician recommends that you return for lab work in:lab work today...we will call you with results 2)  Your physician wants you to follow-up in: 5 months  You will receive a reminder letter in the mail two months in advance. If you don't receive a letter, please call our office to schedule the follow-up appointment.

## 2010-11-14 NOTE — Letter (Signed)
Summary: [facility] Office Note   [facility] Office Note   Imported By: Roderic Ovens 06/02/2010 15:04:00  _____________________________________________________________________  External Attachment:    Type:   Image     Comment:   External Document

## 2010-11-14 NOTE — Miscellaneous (Signed)
Summary: added stress test from 08.29.2007  Clinical Lists Changes  Observations: Added new observation of NUCLEAR NOS: Exercise Capacity:Adenosine study with no exercise  Blood Pressure Response:Normal blood pressure response.  Clinical symptoms: chest pressure  ECG Impression:No significicant ST segement change suggestive of ischemia  Overall Impression:No change from 6/05. There is old small scar in the inferior apex.There is old moderate ischemia in the inferior wall.  (04/22/2007 13:53)      Nuclear Study  Procedure date:  04/22/2007  Findings:      Exercise Capacity:Adenosine study with no exercise  Blood Pressure Response:Normal blood pressure response.  Clinical symptoms: chest pressure  ECG Impression:No significicant ST segement change suggestive of ischemia  Overall Impression:No change from 6/05. There is old small scar in the inferior apex.There is old moderate ischemia in the inferior wall.

## 2010-11-14 NOTE — Miscellaneous (Signed)
  Clinical Lists Changes  Medications: Changed medication from FUROSEMIDE 40 MG TABS (FUROSEMIDE) 1 tab once daily to FUROSEMIDE 40 MG TABS (FUROSEMIDE) 1 tab in the AM and one half a tab in the PM

## 2010-11-14 NOTE — Assessment & Plan Note (Signed)
Summary: per check out/sf   Visit Type:  Follow-up Primary Provider:  dr. Talmage Nap   CC:  sob.  History of Present Illness: Patient is a 65 year old with a history of CAD.  (last catheterizaiont:  LIMA to LAD patient; SVG to OM 100%, SVG to LCx patent patient but Cx with 70 to 80% narroing).  I last saw her in clinic  back in February Since then she does not some SOB with exertion.  She attributes it to her weight being up some, though she says it is only up 4 pounds.  She denies PND.  No chest pain.  Current Medications (verified): 1)  Accupril 10 Mg Tabs (Quinapril Hcl) .... Take 1 Tablet By Mouth Once A Day 2)  Lantus 100 Unit/ml Soln (Insulin Glargine) .... Use As Directed 3)  Lipitor 40 Mg Tabs (Atorvastatin Calcium) .... Take 1 Tablet By Mouth At Bedtime 4)  Metoprolol Succinate 25 Mg Xr24h-Tab (Metoprolol Succinate) .... Take 1 Tablet By Mouth Once A Day 5)  Zetia 10 Mg Tabs (Ezetimibe) .... Take 1 Tablet By Mouth Once A Day 6)  Aspirin Ec 325 Mg Tbec (Aspirin) .... Take One Tablet By Mouth Daily 7)  Humalog Pen 100 Unit/ml Soln (Insulin Lispro (Human)) .... Sliding Scale 8)  Furosemide 40 Mg Tabs (Furosemide) .[name].. 1 Tab Once Daily 9)  Levothyroxine Sodium 100 Mcg Tabs (Levothyroxine Sodium) .[name].. 1 Tab Once Daily 10)  Neurontin 100 Mg Caps (Gabapentin) .[name].. 1 Cap As Needed 11)  Colace .... Two Times A Day  Allergies (verified): 1)  ! Keflex 2)  ! Cipro 3)  ! Adhesive Tape  Past History:  Past medical, surgical, family and social histories (including risk factors) reviewed, and no changes noted (except as noted below).  Past Medical History: Reviewed history from 09/26/2009 and no changes required. Current Problems:  HYPERTENSION, UNSPECIFIED (ICD-401.9) DYSLIPIDEMIA (ICD-272.4) UNSPECIFIED HYPOTHYROIDISM (ICD-244.9)   1.  Diabetes type 2.  2.  Recurrent UTIs.  3.  Hypothyroidism.  4.  Coronary artery disease status post CABG x3 in 1993.  5.  Scleroderma.  6.   Appendectomy in 1991.  7.  Bilateral carpal tunnel syndrome in the 1970s.  8.  Dyslipidemia.  9.  PVOD  Family History: Reviewed history from 11/10/2009 and no changes required.  Mother had diabetes mellitus.      Social History: Reviewed history from 11/10/2009 and no changes required. She does not smoke or use alcohol.  She works as an   Environmental health practitioner.   Vital Signs:  Patient profile:   65 year old female Height:      63 inches Weight:      179 pounds BMI:     31.82 Pulse rate:   79 / minute BP sitting:   130 / 60  (left arm) Cuff size:   regular  Vitals Entered By: Burnett Kanaris, CNA (April 24, 2010 11:37 AM)  Physical Exam  Additional Exam:  patient is in NAD HEENT:  Normocephalic, atraumatic. EOMI, PERRLA.  Neck: JVP is increasedl.  Lungs: clear to auscultation. No rales no wheezes.  Heart: Regular rate and rhythm. Normal S1, S2. No S3.   No significant murmurs. PMI not displaced.  Abdomen:  Supple, nontender. Normal bowel sounds. No masses. No hepatomegaly.  Extremities:   Good distal pulses throughout. Triv RLE edema.  L foot in boot.  Musculoskeletal :moving all extremities.  Neuro:   alert and oriented x3.    Impression & Recommendations:  Problem # 1:  CAD (ICD-414.00) patient does complain of dyspnea with exertion.  Volume is not significantly up on exam, though JVP is incrased. She denies chest pain.  I am not convinced that this represents an anginal equivallent. I would recommend checking labs today.  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  Arbie Cookey.  Has appt soon.  Problem # 3:  HYPERTENSION, UNSPECIFIED (ICD-401.9) Changes as noted.  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  Problem # 5:  DYSLIPIDEMIA (ICD-272.4) COntinue  meds.  Other Orders: TLB-CBC Platelet - w/Differential (85025-CBCD) TLB-BMP (Basic Metabolic Panel-BMET) (80048-METABOL) TLB-BNP (B-Natriuretic Peptide) (83880-BNPR) TLB-TSH (Thyroid Stimulating Hormone) (25366-YQI)  Patient Instructions: 1)  Your physician recommends that you return for lab work in: lab work today...we will call you with results 2)  Your physician wants you to follow-up in: 4 months  You will receive a reminder letter in the mail two months in advance. If you don't receive a letter, please call our office to schedule the follow-up appointment.

## 2010-12-31 LAB — GLUCOSE, CAPILLARY
Glucose-Capillary: 129 mg/dL — ABNORMAL HIGH (ref 70–99)
Glucose-Capillary: 193 mg/dL — ABNORMAL HIGH (ref 70–99)
Glucose-Capillary: 317 mg/dL — ABNORMAL HIGH (ref 70–99)
Glucose-Capillary: 326 mg/dL — ABNORMAL HIGH (ref 70–99)

## 2010-12-31 LAB — ANAEROBIC CULTURE

## 2010-12-31 LAB — BASIC METABOLIC PANEL
BUN: 26 mg/dL — ABNORMAL HIGH (ref 6–23)
Calcium: 8.5 mg/dL (ref 8.4–10.5)
Creatinine, Ser: 1.45 mg/dL — ABNORMAL HIGH (ref 0.4–1.2)
GFR calc non Af Amer: 36 mL/min — ABNORMAL LOW (ref >60)

## 2010-12-31 LAB — WOUND CULTURE

## 2010-12-31 LAB — CBC
Platelets: 246 10*3/uL (ref 150–400)
WBC: 6.4 10*3/uL (ref 4.0–10.5)

## 2010-12-31 LAB — POCT I-STAT 4, (NA,K, GLUC, HGB,HCT)
Hemoglobin: 11.6 g/dL — ABNORMAL LOW (ref 12.0–15.0)
Potassium: 4 mEq/L (ref 3.5–5.1)

## 2011-01-19 LAB — CBC
Hemoglobin: 6.2 g/dL — CL (ref 12.0–15.0)
Hemoglobin: 7.3 g/dL — CL (ref 12.0–15.0)
Hemoglobin: 9.2 g/dL — ABNORMAL LOW (ref 12.0–15.0)
MCHC: 32.4 g/dL (ref 30.0–36.0)
MCHC: 32.4 g/dL (ref 30.0–36.0)
MCV: 83.2 fL (ref 78.0–100.0)
MCV: 83.7 fL (ref 78.0–100.0)
Platelets: 320 10*3/uL (ref 150–400)
RBC: 2.29 MIL/uL — ABNORMAL LOW (ref 3.87–5.11)
RBC: 2.9 MIL/uL — ABNORMAL LOW (ref 3.87–5.11)
RBC: 3.26 MIL/uL — ABNORMAL LOW (ref 3.87–5.11)
RDW: 16.7 % — ABNORMAL HIGH (ref 11.5–15.5)
RDW: 18.8 % — ABNORMAL HIGH (ref 11.5–15.5)
WBC: 8.9 10*3/uL (ref 4.0–10.5)
WBC: 10.4 10*3/uL (ref 4.0–10.5)

## 2011-01-19 LAB — GLUCOSE, CAPILLARY
Glucose-Capillary: 100 mg/dL — ABNORMAL HIGH (ref 70–99)
Glucose-Capillary: 108 mg/dL — ABNORMAL HIGH (ref 70–99)
Glucose-Capillary: 117 mg/dL — ABNORMAL HIGH (ref 70–99)
Glucose-Capillary: 134 mg/dL — ABNORMAL HIGH (ref 70–99)
Glucose-Capillary: 139 mg/dL — ABNORMAL HIGH (ref 70–99)
Glucose-Capillary: 142 mg/dL — ABNORMAL HIGH (ref 70–99)
Glucose-Capillary: 149 mg/dL — ABNORMAL HIGH (ref 70–99)
Glucose-Capillary: 155 mg/dL — ABNORMAL HIGH (ref 70–99)
Glucose-Capillary: 159 mg/dL — ABNORMAL HIGH (ref 70–99)
Glucose-Capillary: 168 mg/dL — ABNORMAL HIGH (ref 70–99)
Glucose-Capillary: 184 mg/dL — ABNORMAL HIGH (ref 70–99)
Glucose-Capillary: 185 mg/dL — ABNORMAL HIGH (ref 70–99)
Glucose-Capillary: 191 mg/dL — ABNORMAL HIGH (ref 70–99)
Glucose-Capillary: 196 mg/dL — ABNORMAL HIGH (ref 70–99)
Glucose-Capillary: 199 mg/dL — ABNORMAL HIGH (ref 70–99)
Glucose-Capillary: 205 mg/dL — ABNORMAL HIGH (ref 70–99)
Glucose-Capillary: 222 mg/dL — ABNORMAL HIGH (ref 70–99)
Glucose-Capillary: 222 mg/dL — ABNORMAL HIGH (ref 70–99)
Glucose-Capillary: 224 mg/dL — ABNORMAL HIGH (ref 70–99)
Glucose-Capillary: 251 mg/dL — ABNORMAL HIGH (ref 70–99)
Glucose-Capillary: 59 mg/dL — ABNORMAL LOW (ref 70–99)
Glucose-Capillary: 62 mg/dL — ABNORMAL LOW (ref 70–99)
Glucose-Capillary: 87 mg/dL (ref 70–99)
Glucose-Capillary: 99 mg/dL (ref 70–99)
Glucose-Capillary: 200 mg/dL — ABNORMAL HIGH (ref 70–99)
Glucose-Capillary: 214 mg/dL — ABNORMAL HIGH (ref 70–99)
Glucose-Capillary: 239 mg/dL — ABNORMAL HIGH (ref 70–99)
Glucose-Capillary: 251 mg/dL — ABNORMAL HIGH (ref 70–99)
Glucose-Capillary: 261 mg/dL — ABNORMAL HIGH (ref 70–99)
Glucose-Capillary: 283 mg/dL — ABNORMAL HIGH (ref 70–99)
Glucose-Capillary: 96 mg/dL (ref 70–99)

## 2011-01-19 LAB — ANAEROBIC CULTURE

## 2011-01-19 LAB — BASIC METABOLIC PANEL
BUN: 36 mg/dL — ABNORMAL HIGH (ref 6–23)
CO2: 22 mEq/L (ref 19–32)
CO2: 26 mEq/L (ref 19–32)
CO2: 26 mEq/L (ref 19–32)
Calcium: 8.5 mg/dL (ref 8.4–10.5)
Calcium: 8.7 mg/dL (ref 8.4–10.5)
Calcium: 8.8 mg/dL (ref 8.4–10.5)
Chloride: 98 mEq/L (ref 96–112)
Creatinine, Ser: 1.58 mg/dL — ABNORMAL HIGH (ref 0.4–1.2)
Creatinine, Ser: 1.76 mg/dL — ABNORMAL HIGH (ref 0.4–1.2)
Creatinine, Ser: 1.76 mg/dL — ABNORMAL HIGH (ref 0.4–1.2)
GFR calc Af Amer: 34 mL/min — ABNORMAL LOW (ref >60)
GFR calc Af Amer: 35 mL/min — ABNORMAL LOW (ref >60)
GFR calc non Af Amer: 28 mL/min — ABNORMAL LOW (ref >60)
GFR calc non Af Amer: 29 mL/min — ABNORMAL LOW (ref >60)
Glucose, Bld: 193 mg/dL — ABNORMAL HIGH (ref 70–99)
Sodium: 133 mEq/L — ABNORMAL LOW (ref 135–145)
Sodium: 135 mEq/L (ref 135–145)
Sodium: 137 mEq/L (ref 135–145)

## 2011-01-19 LAB — CROSSMATCH: ABO/RH(D): O NEG

## 2011-01-19 LAB — COMPREHENSIVE METABOLIC PANEL
AST: 17 U/L (ref 0–37)
Albumin: 2.8 g/dL — ABNORMAL LOW (ref 3.5–5.2)
Chloride: 101 mEq/L (ref 96–112)
Creatinine, Ser: 1.54 mg/dL — ABNORMAL HIGH (ref 0.4–1.2)
GFR calc Af Amer: 41 mL/min — ABNORMAL LOW (ref >60)
Potassium: 4.6 mEq/L (ref 3.5–5.1)
Total Bilirubin: 0.1 mg/dL — ABNORMAL LOW (ref 0.3–1.2)

## 2011-01-19 LAB — WOUND CULTURE

## 2011-01-19 LAB — VANCOMYCIN, TROUGH: Vancomycin Tr: 25.8 ug/mL (ref 10.0–20.0)

## 2011-01-20 LAB — GLUCOSE, CAPILLARY
Glucose-Capillary: 143 mg/dL — ABNORMAL HIGH (ref 70–99)
Glucose-Capillary: 164 mg/dL — ABNORMAL HIGH (ref 70–99)

## 2011-01-22 LAB — COMPREHENSIVE METABOLIC PANEL
Albumin: 2.8 g/dL — ABNORMAL LOW (ref 3.5–5.2)
BUN: 25 mg/dL — ABNORMAL HIGH (ref 6–23)
Creatinine, Ser: 1.55 mg/dL — ABNORMAL HIGH (ref 0.4–1.2)
Glucose, Bld: 88 mg/dL (ref 70–99)
Total Bilirubin: 0.4 mg/dL (ref 0.3–1.2)
Total Protein: 7.7 g/dL (ref 6.0–8.3)

## 2011-01-22 LAB — CBC
HCT: 27.4 % — ABNORMAL LOW (ref 36.0–46.0)
HCT: 28.9 % — ABNORMAL LOW (ref 36.0–46.0)
Hemoglobin: 10 g/dL — ABNORMAL LOW (ref 12.0–15.0)
Hemoglobin: 7.7 g/dL — CL (ref 12.0–15.0)
Hemoglobin: 9.4 g/dL — ABNORMAL LOW (ref 12.0–15.0)
MCHC: 34.2 g/dL (ref 30.0–36.0)
MCV: 90.4 fL (ref 78.0–100.0)
Platelets: 293 10*3/uL (ref 150–400)
Platelets: 333 10*3/uL (ref 150–400)
RBC: 2.5 MIL/uL — ABNORMAL LOW (ref 3.87–5.11)
RDW: 13.1 % (ref 11.5–15.5)
RDW: 13.4 % (ref 11.5–15.5)

## 2011-01-22 LAB — URINE MICROSCOPIC-ADD ON

## 2011-01-22 LAB — URINALYSIS, ROUTINE W REFLEX MICROSCOPIC
Glucose, UA: NEGATIVE mg/dL
Ketones, ur: NEGATIVE mg/dL
Leukocytes, UA: NEGATIVE
Nitrite: NEGATIVE
Specific Gravity, Urine: 1.018 (ref 1.005–1.030)
pH: 6 (ref 5.0–8.0)

## 2011-01-22 LAB — PROTIME-INR
INR: 1.1 (ref 0.00–1.49)
Prothrombin Time: 14 seconds (ref 11.6–15.2)

## 2011-01-22 LAB — GLUCOSE, CAPILLARY
Glucose-Capillary: 102 mg/dL — ABNORMAL HIGH (ref 70–99)
Glucose-Capillary: 104 mg/dL — ABNORMAL HIGH (ref 70–99)
Glucose-Capillary: 114 mg/dL — ABNORMAL HIGH (ref 70–99)
Glucose-Capillary: 119 mg/dL — ABNORMAL HIGH (ref 70–99)
Glucose-Capillary: 123 mg/dL — ABNORMAL HIGH (ref 70–99)
Glucose-Capillary: 132 mg/dL — ABNORMAL HIGH (ref 70–99)
Glucose-Capillary: 169 mg/dL — ABNORMAL HIGH (ref 70–99)
Glucose-Capillary: 172 mg/dL — ABNORMAL HIGH (ref 70–99)
Glucose-Capillary: 231 mg/dL — ABNORMAL HIGH (ref 70–99)
Glucose-Capillary: 236 mg/dL — ABNORMAL HIGH (ref 70–99)
Glucose-Capillary: 281 mg/dL — ABNORMAL HIGH (ref 70–99)
Glucose-Capillary: 56 mg/dL — ABNORMAL LOW (ref 70–99)
Glucose-Capillary: 91 mg/dL (ref 70–99)
Glucose-Capillary: 92 mg/dL (ref 70–99)

## 2011-01-22 LAB — BASIC METABOLIC PANEL
BUN: 24 mg/dL — ABNORMAL HIGH (ref 6–23)
Calcium: 8.3 mg/dL — ABNORMAL LOW (ref 8.4–10.5)
Calcium: 8.8 mg/dL (ref 8.4–10.5)
GFR calc Af Amer: 35 mL/min — ABNORMAL LOW (ref >60)
GFR calc non Af Amer: 29 mL/min — ABNORMAL LOW (ref >60)
GFR calc non Af Amer: 38 mL/min — ABNORMAL LOW (ref >60)
Glucose, Bld: 162 mg/dL — ABNORMAL HIGH (ref 70–99)
Potassium: 5.6 mEq/L — ABNORMAL HIGH (ref 3.5–5.1)
Sodium: 128 mEq/L — ABNORMAL LOW (ref 135–145)
Sodium: 135 mEq/L (ref 135–145)

## 2011-01-22 LAB — CARDIAC PANEL(CRET KIN+CKTOT+MB+TROPI)
CK, MB: 5.6 ng/mL — ABNORMAL HIGH (ref 0.3–4.0)
Relative Index: 4 — ABNORMAL HIGH (ref 0.0–2.5)
Troponin I: 0.04 ng/mL (ref 0.00–0.06)

## 2011-01-22 LAB — ANAEROBIC CULTURE

## 2011-01-22 LAB — POCT I-STAT, CHEM 8
BUN: 21 mg/dL (ref 6–23)
Calcium, Ion: 1.11 mmol/L — ABNORMAL LOW (ref 1.12–1.32)
Glucose, Bld: 107 mg/dL — ABNORMAL HIGH (ref 70–99)
TCO2: 25 mmol/L (ref 0–100)

## 2011-01-22 LAB — VANCOMYCIN, TROUGH: Vancomycin Tr: 18.1 ug/mL (ref 10.0–20.0)

## 2011-01-22 LAB — CULTURE, ROUTINE-ABSCESS

## 2011-01-22 LAB — MRSA PCR SCREENING: MRSA by PCR: NEGATIVE

## 2011-01-24 LAB — POCT I-STAT GLUCOSE: Glucose, Bld: 106 mg/dL — ABNORMAL HIGH (ref 70–99)

## 2011-02-13 ENCOUNTER — Encounter: Payer: Self-pay | Admitting: Internal Medicine

## 2011-02-16 ENCOUNTER — Ambulatory Visit (INDEPENDENT_AMBULATORY_CARE_PROVIDER_SITE_OTHER): Payer: Medicare Other | Admitting: Internal Medicine

## 2011-02-16 ENCOUNTER — Encounter: Payer: Self-pay | Admitting: Internal Medicine

## 2011-02-16 DIAGNOSIS — Z8679 Personal history of other diseases of the circulatory system: Secondary | ICD-10-CM

## 2011-02-16 DIAGNOSIS — R0989 Other specified symptoms and signs involving the circulatory and respiratory systems: Secondary | ICD-10-CM

## 2011-02-16 DIAGNOSIS — I2510 Atherosclerotic heart disease of native coronary artery without angina pectoris: Secondary | ICD-10-CM

## 2011-02-16 DIAGNOSIS — E785 Hyperlipidemia, unspecified: Secondary | ICD-10-CM

## 2011-02-16 DIAGNOSIS — R0609 Other forms of dyspnea: Secondary | ICD-10-CM

## 2011-02-16 DIAGNOSIS — E039 Hypothyroidism, unspecified: Secondary | ICD-10-CM

## 2011-02-16 LAB — BASIC METABOLIC PANEL
BUN: 22 mg/dL (ref 6–23)
Chloride: 107 mEq/L (ref 96–112)
Creatinine, Ser: 1.2 mg/dL (ref 0.4–1.2)
Glucose, Bld: 103 mg/dL — ABNORMAL HIGH (ref 70–99)
Potassium: 4.4 mEq/L (ref 3.5–5.1)

## 2011-02-16 LAB — BRAIN NATRIURETIC PEPTIDE: Pro B Natriuretic peptide (BNP): 330 pg/mL — ABNORMAL HIGH (ref 0.0–100.0)

## 2011-02-16 NOTE — Assessment & Plan Note (Signed)
Ms. Forgey has always been active.  She now complains of increased DOE.  She has cut back on activity because of this.  Question if it is simple volume increase because she is not taking Lasix regularly.  Question if it is due to change in LV function I would check labs today.  I have asked her to take 1 1/2 lasix and see respons.  I would also schedule for and echo to reevaluate LV function.

## 2011-02-16 NOTE — Assessment & Plan Note (Signed)
Keep on lipitor and Zetia for now.

## 2011-02-16 NOTE — Progress Notes (Signed)
HPI Patient is a 65 year old with a history of CAD.  (last catheterizaiont:  LIMA to LAD patient; SVG to OM 100%, SVG to LCx patent patient but Cx with 70 to 80% narroing).  I last saw her in clinic  back in July of last year. Since I saw her she sys over the past three months she has noticed increased SOB with exertion.  Continues with a low level of edema in legs; today is worse. No CP.  No PND.  DOes not take lasix regularly, interferes with activity.  With 40 mg she does not urinate like she used tol. TSH yesterday was normal.  Hgb A1c improved.  Allergies  Allergen Reactions  . Cephalexin   . Ciprofloxacin     Current Outpatient Prescriptions  Medication Sig Dispense Refill  . aspirin EC 325 MG EC tablet Take 325 mg by mouth daily.        [name] atorvastatin (LIPITOR) 40 MG tablet Take 40 mg by mouth at bedtime.        Tery Sanfilippo Sodium (COLACE PO) Take by mouth 2 (two) times daily as needed.       . ezetimibe (ZETIA) 10 MG tablet Take 10 mg by mouth daily.        [name] gabapentin (NEURONTIN) 100 MG capsule 1 cap as needed       . insulin lispro (HUMALOG PEN) 100 UNIT/ML injection Use as directed       . latanoprost (XALATAN) 0.005 % ophthalmic solution Place 1 drop into both eyes at bedtime.        [name] levothyroxine (SYNTHROID, LEVOTHROID) 125 MCG tablet Take 125 mcg by mouth daily.        . metoprolol succinate (TOPROL-XL) 25 MG 24 hr tablet Take 25 mg by mouth daily.        . quinapril (ACCUPRIL) 10 MG tablet Take 10 mg by mouth at bedtime.        . furosemide (LASIX) 40 MG tablet 1 tab in the AM and 1/2 tab in the PM       . DISCONTD: levothyroxine (SYNTHROID, LEVOTHROID) 100 MCG tablet Take 100 mcg by mouth daily.          Past Medical History  Diagnosis Date  . HTN (hypertension)   . Dyslipidemia   . Hypothyroidism     unspecified  . Diabetes mellitus, type 2   . History of recurrent UTIs   . CAD (coronary artery disease)     s/p CABG x3 in 1993  . Scleroderma   . S/P  appendectomy 1991  . Bilateral carpal tunnel syndrome 1970s  . PVOD (pulmonary veno-occlusive disease)     No past surgical history on file.  Family History  Problem Relation Age of Onset  . Diabetes Mother     History   Social History  . Marital Status: Single    Spouse Name: N/A    Number of Children: N/A  . Years of Education: N/A   Occupational History  . Environmental health practitioner    Social History Main Topics  . Smoking status: Never Smoker   . Smokeless tobacco: Not on file  . Alcohol Use: No  . Drug Use: Not on file  . Sexually Active: Not on file   Other Topics Concern  . Not on file   Social History Narrative  . No narrative on file    Review of Systems:  All systems reviewed.  They are negative to the above problem  except as previously stated.  Vital Signs: BP 136/57  Pulse 76  Ht 5\' 3"  (1.6 m)  Wt 185 lb (83.915 kg)  BMI 32.77 kg/m2  Physical Exam  HEENT:  Normocephalic, atraumatic. EOMI, PERRLA.  Neck: JVP is increasedl. No thyromegaly. No bruits.  Lungs: clear to auscultation. No rales no wheezes.  Heart: Regular rate and rhythm. Normal S1, S2. No S3.   Gr II systolic mumur at base, Gr i/VI at apex.[name] PMI not displaced.  Abdomen:  Supple, nontender. Normal bowel sounds. No masses. No hepatomegaly.  Extremities:   t. 1 +  lower extremity edema.  Musculoskeletal :moving all extremities.  Neuro:   alert and oriented x3.  CN II-XII grossly intact.  NSR>  66.  ANterior MI.  IWMI.  Twave inversion I. AVL   Assessment and Plan:

## 2011-02-16 NOTE — Patient Instructions (Signed)
Lab work today. We will call you with results  Your physician has requested that you have an echocardiogram. Echocardiography is a painless test that uses sound waves to create images of your heart. It provides your doctor with information about the size and shape of your heart and how well your heart's chambers and valves are working. This procedure takes approximately one hour. There are no restrictions for this procedure.   Your physician wants you to follow-up in:  November 2012 with Dr.Ross You will receive a reminder letter in the mail two months in advance. If you don't receive a letter, please call our office to schedule the follow-up appointment.

## 2011-02-16 NOTE — Assessment & Plan Note (Signed)
Followed by Dr. Early 

## 2011-02-16 NOTE — Assessment & Plan Note (Signed)
TSH yesterday was normal.

## 2011-02-19 ENCOUNTER — Telehealth: Payer: Self-pay | Admitting: Internal Medicine

## 2011-02-19 NOTE — Telephone Encounter (Signed)
Pt was told by [name] to call to get lab results

## 2011-02-19 NOTE — Telephone Encounter (Signed)
See note attached to lab from today.

## 2011-02-27 ENCOUNTER — Ambulatory Visit (HOSPITAL_COMMUNITY): Payer: Medicare Other | Attending: Internal Medicine | Admitting: Radiology

## 2011-02-27 DIAGNOSIS — I079 Rheumatic tricuspid valve disease, unspecified: Secondary | ICD-10-CM | POA: Insufficient documentation

## 2011-02-27 DIAGNOSIS — R0989 Other specified symptoms and signs involving the circulatory and respiratory systems: Secondary | ICD-10-CM | POA: Insufficient documentation

## 2011-02-27 DIAGNOSIS — R0609 Other forms of dyspnea: Secondary | ICD-10-CM | POA: Insufficient documentation

## 2011-02-27 DIAGNOSIS — I080 Rheumatic disorders of both mitral and aortic valves: Secondary | ICD-10-CM | POA: Insufficient documentation

## 2011-02-27 DIAGNOSIS — E785 Hyperlipidemia, unspecified: Secondary | ICD-10-CM | POA: Insufficient documentation

## 2011-02-27 DIAGNOSIS — I359 Nonrheumatic aortic valve disorder, unspecified: Secondary | ICD-10-CM

## 2011-02-27 DIAGNOSIS — I10 Essential (primary) hypertension: Secondary | ICD-10-CM | POA: Insufficient documentation

## 2011-02-27 DIAGNOSIS — I379 Nonrheumatic pulmonary valve disorder, unspecified: Secondary | ICD-10-CM | POA: Insufficient documentation

## 2011-02-27 DIAGNOSIS — E669 Obesity, unspecified: Secondary | ICD-10-CM | POA: Insufficient documentation

## 2011-02-27 DIAGNOSIS — E119 Type 2 diabetes mellitus without complications: Secondary | ICD-10-CM | POA: Insufficient documentation

## 2011-02-27 NOTE — Discharge Summary (Signed)
[name], [name]           ACCOUNT NO.:  [id number]   MEDICAL RECORD NO.:  [id number]          PATIENT TYPE:  INP   LOCATION:  5036                         FACILITY:  [facility]   PHYSICIAN:  Madelynn Done, MD  DATE OF BIRTH:  May 12, 1946   DATE OF ADMISSION:  06/12/2008  DATE OF DISCHARGE:  06/14/2008                               DISCHARGE SUMMARY   ADMISSION DIAGNOSIS:  Right hand abscess.   DISCHARGE DIAGNOSES:  1. Right hand abscess.  2. Hypertension.  3. Diabetes.  4. Hypothyroidism.  5. Hypercholesterolemia.   PROCEDURES AND DATES:  Incision and drainage of right hand abscess on  June 12, 2008.   DISCHARGE MEDICATIONS:  1. Resume home medications and doses.  2. Darvocet-N 100 one to two tablets every 4-6 hours as needed for      pain.  3. Doxycycline 100 mg p.o. b.i.d.   REASON FOR ADMISSION:  [name] is a 65 year old female who  sustained a cat bite approximately 2 weeks ago and had a worsening  infection to her right hand.  She was seen and evaluated in the office  and admitted to undergo the above procedure and was continued as an  inpatient for the IV management and wound care.   HOSPITAL COURSE:  The patient was admitted to the orthopedic floor  following her above procedure.  She was continued on IV antibiotics  following her procedures.  Her intraoperative cultures did show Staph  aureus.  The sensitivities are still pending.  The patient did well  postoperatively.  She was seen and examined on postoperative day #1 and  #2 was afebrile.  Vital signs were stable and normal,  tolerating a  regular diet.  She was seen on postoperative day #2 and her drain was  removed.  Her hand looked better.  There was no purulence coming from  the wound.  The patient was felt ready to be discharged to home.   CONDITION AT DISCHARGE:  Good.   RECOMMENDATIONS AT DISPOSITION:  The patient will be seen in the office  today for local wound care.  She will begin an  outpatient wound care  plan.  She will continue on oral medications for the infection.  We will  follow her cultures and await final sensitivities.  Prior to discharge,  the patient's discharge instructions were explained to her, questions  were answered and the patient voiced understanding of the discharge  instructions.      Madelynn Done, MD  Electronically Signed     FWO/MEDQ  D:  06/14/2008  T:  06/14/2008  Job:  [id number]

## 2011-02-27 NOTE — Assessment & Plan Note (Signed)
OFFICE VISIT   [name], [name] A  DOB:  05-01-1946                                       09/23/2009  [id number]#:[id number]   Patient presents today for concern regarding her toe amputation.  She  had been switched from saline to Hydrogel last week and had this  sensation after 3 days that there was some increased erythema.  She was  quite concerned with this, and we are seeing her for further follow-up.  She has switched back to saline wet-to-dry.   Her wound looks extremely good today with excellent granulation base and  no evidence of surrounding erythema and continued contraction.  I  explained that I doubt that this would have been related to the  Hydrogel, but nonetheless since she is having ongoing success with  saline, will continue with saline wet-to-dry dressing.   Plan to see her again in 1 month for continued followup.   Larina Earthly, M.D.  Electronically Signed   TFE/MEDQ  D:  09/23/2009  T:  09/27/2009  Job:  [id number]

## 2011-02-27 NOTE — Assessment & Plan Note (Signed)
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  She looks quite good  today.  She has [name] palpable femoral to anterior tibial graft pulse and is  pleased with her 1-year anniversary of her surgery.   She has [name] very small, less than 1-cm punctate area of continued open  incision in her transmetatarsal amputation, otherwise completely healed.  She did have [name] callous area that was debrided off of the skin incision  today.   Her ankle-arm index is stable at 0.95 on the left and also is stable at  0.83 on her nonoperative leg on the right.   She will continue her usual activity.  I plan to see her again at her  next protocol visit.     Larina Earthly, M.D.  Electronically Signed   TFE/MEDQ  D:  05/05/2010  T:  05/08/2010  Job:  [id number]   cc:   Pricilla Riffle, MD, [facility]

## 2011-02-27 NOTE — Op Note (Signed)
NAMEGAYLYNN, [name]           ACCOUNT NO.:  [id number]   MEDICAL RECORD NO.:  [id number]          PATIENT TYPE:  OIB   LOCATION:  5009                         FACILITY:  [facility]   PHYSICIAN:  Madelynn Done, MD  DATE OF BIRTH:  [birth year]   DATE OF PROCEDURE:  06/23/2008  DATE OF DISCHARGE:                               OPERATIVE REPORT   PREOPERATIVE DIAGNOSIS:  Right hand infection, cat bite, methicillin-  resistant Staphylococcus aureus infection.   POSTOPERATIVE DIAGNOSIS:  Right hand infection, cat bite, methicillin-  resistant Staphylococcus aureus infection.   ATTENDING SURGEON:  Madelynn Done, MD, who was scrubbed and present  for the entire procedure.   ASSISTANT SURGEON:  None.   SURGICAL PROCEDURES:  1. Right hand drainage of multiple palmar bursa, incision and drainage      of abscess, right hand.  2. Right hand ulnar nerve decompression, Guyon canal.   SURGICAL ANESTHESIA:  General via laryngeal mask airway.   TOURNIQUET TIME:  Less than 10 minutes with 250 mmHg.   SURGICAL INDICATIONS:  [name] is a 65 year old female who was taken  to the OR on June 12, 2008, and underwent incision and drainage of an  abscess.  This was consistent with an MRSA abscess.  She is on oral  doxycycline; however, she continued to not show improvements with regard  to her healing.  It was felt given the persistent infection the patient  be taken back to the OR to undergo repeat incision and drainage.  Risks,  benefits, and alternatives were discussed in detail with the patient and  signed informed consent was obtained.   DESCRIPTION OF PROCEDURE:  The patient was brought and identified in the  preoperative holding area and a marker made on the right hand to  indicate the correct operative site.  The patient then brought back to  the operating room, placed supine on the anesthesia room table.  General  anesthesia was administered via LMA.  The patient received IV  vancomycin  prior to any skin incisions.  A well-padded tourniquet was then placed  on the right brachium and sealed with a 1000 drape.  The right upper  extremity was then prepped with Betadine and sterilely draped.  Time-out  was called, the correct side was identified, and the procedure was then  begun.  The limb was then elevated and the tourniquet insufflated 250  mmHg.  Using the previous incision over the hypothenar area, it was then  carried out proximally and distally and obliquely across the wrist  crease to expose the entire hypothenar region of the hand.  After  adequate dissection of the skin and subcutaneous tissues, gross  purulence was encountered more so over the hypothenar and over Guyon  canal.  The palmar bursa was then resected and the exudative as well as  fibrinous tissue was then removed.  Careful dissection was then carried  out in the ulnar nerve and artery through Guyon canal and the nerve was  then released both proximally and distally, ulnar nerve decompression at  the level of the wrist.  Following this, the wound  was then thoroughly  irrigated with lactated Ringer solution.  Over 3 L were placed into the  hand.  After thorough irrigation and debridement, the hand was then  closed over small drains, 2 blue vessel loops were then applied.  The  skin was then loosely reapproximated with 4-0 nylon suture.  Adaptic  dressing and a sterile compressive dressing was then applied.  The  patient was then placed in well-padded volar splint.  She was then  extubated and taken to recovery room in good condition.   POSTOPERATIVE PLAN:  The patient will be admitted for IV antibiotics and  wound care.  She will be rescheduled to return back to the OR in  approximately 48 hours for repeat I&D given the severity of her  infection.      Madelynn Done, MD  Electronically Signed     FWO/MEDQ  D:  06/23/2008  T:  06/24/2008  Job:  [id number]

## 2011-02-27 NOTE — Procedures (Signed)
BYPASS GRAFT EVALUATION   INDICATION:  Follow up left lower extremity bypass graft.   HISTORY:  Diabetes:  Yes.  Cardiac:  No.  Hypertension:  Yes.  Smoking:  No.  Previous Surgery:  Left femoral to anterior tibial artery bypass graft  on 04/19/2009.   SINGLE LEVEL ARTERIAL EXAM                               RIGHT              LEFT  Brachial:                    174                173  Anterior tibial:             144                161  Posterior tibial:            119                129  Peroneal:  Ankle/brachial index:        0.83               0.93   PREVIOUS ABI:  Date: 12/30/2009  RIGHT:  0.83  LEFT:  0.75   LOWER EXTREMITY BYPASS GRAFT DUPLEX EXAM:   DUPLEX:  Biphasic Doppler waveforms noted throughout the right lower  extremity bypass graft.  There is an increased velocity of 252 cm/s  noted at the distal anastomosis level, which is most likely due to a  change in vessel diameter since no focal narrowing is noted.   IMPRESSION:  1. Patent left femoral to anterior tibial artery bypass graft with an      increased velocity noted, as described above.  2. The left ankle brachial index appears increased when compared to      the previous examination with the right ankle brachial index      remaining stable.   ___________________________________________  Larina Earthly, M.D.   CH/MEDQ  D:  05/05/2010  T:  05/05/2010  Job:  [id number]

## 2011-02-27 NOTE — Assessment & Plan Note (Signed)
[facility]                            CARDIOLOGY OFFICE NOTE   NAME:[name], [name]                  MRN:          [id number]  DATE:05/12/2008                            DOB:          10/18/1945    [name] returns for followup at the [facility], May 12, 2008.  She was seen on April 26, 2008, for  lower extremity edema and pain.  At the time of her evaluation, she had  marked pitting edema up to the mid thighs bilaterally.  She also had  decreased pedal pulses.  Her symptoms seemed related more to edema than  claudication.  She really did not have typical symptoms with exercise.  However, she complained mainly of heaviness and tightness throughout  both legs.  I elected to treat her with diuretics for presumed diastolic  heart failure with a component of venous insufficiency.  She also  underwent duplex scanning of both the arterial and venous system of the  legs.  Her venous study was essentially normal.  There was no evidence  of deep or superficial venous thrombus or incompetence.  Her arterial  study was abnormal with an ABI of 0.79 on the right and 0.44 on the  left.  There was diffuse calcification of her lower extremity arterial  system with stenosis in the right distal SFA and left popliteal artery.   From a symptomatic standpoint, [name] is feeling much better.  Her  leg edema has nearly resolved.  Her symptoms have greatly improved and  she is able to walk without limitation at present.  On further  questioning, she does have calf tightness with exercise, but this is  only an intermittent symptom.  It is fairly mild and has been present  for many years.  Her other symptoms have improved significantly since I  have seen her last.  Labs were drawn today and showed a stable  creatinine of 1.2 and a potassium of 4.3.   CURRENT MEDICATIONS:  Include,  1. Lipitor 40 mg daily.  2. Zetia 10 mg daily.  3. Aspirin 325 mg daily.  4. Accupril 10 mg daily.  5. Levothyroxine 88 mcg daily.  6. Metoprolol 25 mg one-half twice a day.  7. Insulin as directed.  8. Lantus as directed.  9. Lasix 40 mg twice daily.  10.Potassium 20 mEq twice daily.   ALLERGIES:  Includes KEFLEX and CIPRO.   On exam, the patient is alert and oriented.  She is in no acute  distress.  Weight is 173 pounds, this has decreased from 187 pounds on  April 26, 2008.  Blood pressure is 120/60, heart rate is 94.  Lower  extremities have only trace pretibial edema on the right and no edema on  the left.   ASSESSMENT:  1. Bilateral lower extremity edema.  Edema greatly improved after 2      weeks of Lasix.  I have asked her to reduce her dose to 40 mg once      daily and she can discontinue potassium supplementation and follow      high potassium diet.  The etiology of her leg edema may be      multifactorial and related to both diastolic heart failure combined      with venous insufficiency.  2. Lower extremity peripheral arterial disease.  [name] has an      ankle-brachial index in the severe range on the left, but her      symptoms really are quite stable over several years.  I think      continued medical therapy is appropriate.  If she develops      progressive symptoms, we will need to consider angiography, but      this can be followed clinically.  I would like to see her back in 6      months for routine followup.  She should continue medical therapy      under the care of [name].  I will see her back regarding her      lower extremity peripheral arterial disease in 6 months.     [name]. Excell Seltzer, MD  Electronically Signed    MDC/MedQ  DD: 05/12/2008  DT: 05/13/2008  Job #: [id number]   cc:   Pricilla Riffle, MD, [facility]  Dorisann Frames, M.D.

## 2011-02-27 NOTE — Assessment & Plan Note (Signed)
[facility]                            CARDIOLOGY OFFICE NOTE   [name], [name]                  MRN:          [id number]  DATE:04/26/2008                            DOB:          August 27, 1946    PRIMARY CARDIOLOGIST:  Pricilla Riffle, MD, [facility]   REASON FOR CONSULTATION:  Leg pain and edema.   HISTORY OF PRESENT ILLNESS:  [name] is a 65 year old woman with  longstanding diabetes and coronary artery disease.  She underwent  coronary bypass surgery back in 1992.  I was asked to evaluate Ms.  Blacksher for increasing pain in the legs as well as lower extremity edema  over the past few weeks.  She called Dr. Corinda Gubler who arranged this  evaluation.   Ms. Raftery describes approximately 48-month of increasing pain in the  thighs along the medial aspects.  She also describes increasing leg  edema over 3-4 months.  Her legs feel tight.  She does not have any  new exertional leg pain.  She has chronic mild calf pain with  ambulation.  She denies any history of ulceration or nonhealing wounds.  She has no history of stroke or TIA.  She is a longstanding diabetic.  She has also noted some streaking redness along the medial aspect of her  thighs recently.  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   PAST MEDICAL HISTORY:  1. Coronary artery disease status post CABG.  Her most recent ischemic      evaluation was last year where she was found to have stable      findings from the previous study in 2005.  Her LVEF was calculated      at 49% and there was moderate ischemia in the inferior wall, which      is unchanged.  2. Longstanding diabetes with very poor control.  Her recent      hemoglobin A1c was 8.5 and this is about as good as her controls      ever been.  She generally has hemoglobin A1c over 10.  3. Hypothyroidism.  4. Hypertension.  5. Dyslipidemia  6. Diabetic  retinopathy.  7. History of breast cancer.  8. Scleroderma.  9. Osteoarthritis.  10.Aortic stenosis.   CURRENT MEDICATIONS:  1. Lipitor 40 mg daily.  2. Zetia 10 mg daily.  3. Aspirin 325 mg daily.  4. Accupril 10 mg daily.  5. Levothyroxine 88 mcg daily.  6. Metoprolol 25 mg one-half twice daily.  7. Insulin as directed.  8. Lantus as directed.   ALLERGIES:  KEFLEX and CIPRO.   SOCIAL HISTORY:  The patient lives locally in [place].  She is busy  preparing for [employer].  She does not smoke cigarettes or  drink alcohol.   FAMILY HISTORY:  Positive for type 2 diabetes in her mother and coronary  artery disease in the family.   REVIEW OF SYSTEMS:  A 12-point review of systems was performed.  Pertinent positives included recurrent urinary tract infections and  pyelonephritis.  Other systems were reviewed and are negative except as  detailed.   PHYSICAL EXAMINATION:  GENERAL:  The patient is alert and oriented, very  pleasant woman, in no acute distress.  VITAL SIGNS:  Weight 187 pounds, blood pressure 140/60, heart rate 68,  respiratory rate 16.  HEENT:  Normal.  NECK:  Jugular venous pressure is elevated.  Carotid upstrokes are  normal with a soft bilateral bruits.  No thyromegaly or thyroid nodules.  LUNGS:  Clear bilaterally.  HEART:  Regular rate and rhythm with a 2/6 systolic murmur along the  left sternal border and right upper sternal border.  No diastolic  murmurs or gallops.  ABDOMEN:  Soft, nontender, no organomegaly.  Positive bowel sounds.  No  bruits.  BACK:  No CVA tenderness.  EXTREMITIES:  There is pitting edema up to the mid thighs bilaterally.  The edema is 2+.  There is mild erythema of the lower legs and mild  streaking erythema of the medial thighs with tenderness.  Femoral pulses  are 2+ without bruits.  Popliteal pulses are not palpable.  Dorsalis  pedis pulses are 1+.  Posterior tibial pulses are not palpable.  SKIN:  There is no  area of skin breakdown or ulceration of the feet.  The skin on the feet is very dry.   Blood work reviewed from March showed a creatinine of 1.0, potassium  3.6.  Platelet count 266, hemoglobin 12.5, white blood cell count 6.9.   ABIs performed several years ago were 0.7 bilaterally.   Carotid ultrasound from 2007 was in the 0-39% range   ASSESSMENT:  1. Bilateral leg edema.  Differential diagnosis includes heart failure      versus venous insufficiency versus deep venous thrombosis.  Jugular      venous pressure is elevated on exam and left ventricular function      is known to be preserved.  We will treat with Lasix 40 mg twice      daily and supplement potassium 20 mEq twice daily.  Check arterial      and venous duplex of the legs to rule out venous thromboses.  I      would like to see her back in close followup to make sure that her      leg symptoms are improving.  We will check arterial duplex in the      setting of known peripheral arterial disease.  I do not think her      current symptoms are related to occlusive arterial disease.  She      also has clinical signs of superficial thrombophlebitis, which      should be evident on duplex as well.  2. Bilateral carotid bruits.  Carotid disease has been mild and she is      due for serial followup this year.  3. Coronary artery disease status post coronary artery bypass graft.      Continue to follow up per [name].   For followup, we will check a metabolic panel in 1-week and see her back  in clinic in 2 weeks.  We will assess clinical response to furosemide at  that point.  Also, we will review arterial and venous lower extremity  vascular studies.     [name]. Excell Seltzer, MD  Electronically Signed    MDC/MedQ  DD: 04/26/2008  DT: 04/27/2008  Job #: [id number]   cc:   Pricilla Riffle, MD, [facility]  Dorisann Frames, M.D.

## 2011-02-27 NOTE — Assessment & Plan Note (Signed)
OFFICE VISIT   SHARITA, BIENAIME A  DOB:  1946-06-29                                       09/05/2009  ZOXWR#:[id number]   The patient returns today for examination of the sole of her left foot,  concerned about a black discoloration that the home health nurse noted  in changing her dressing.  She most recently had amputation of her left  third toe and portion of the metatarsal heads of her second and fifth  toes by Dr. Myra Gianotti on June 30, 2009, having originally had surgery  by [name] including femoral tibial bypass grafting and amputation of  the left fourth and fifth toes in July of this year.  She states that  the wound is slowly improving but is concerned about this discoloration  on the sole of the foot.   PHYSICAL EXAMINATION:  Blood pressure 125/55, heart rate 73.  The left  foot is nice and pink and warm and well-perfused.  There was an open  lateral foot wound, as described, which is slowly granulating.  It does  not appear infected.  There is some black discoloration on the skin  which is not skin necrosis but appears to be something that has rubbed  off on the skin and is not infected.  I cleaned some of this with  alcohol, but it was uncomfortable to do this so I just left the rest in  place.  I reassured her that this is not causing any problem.  She has a  vascular lab study today with ABI of 0.61 in the left dorsalis pedis  pulse.  She will return to see [name] on September 16, 2009 as  scheduled.   Quita Skye Hart [name], M.D.  Electronically Signed   JDL/MEDQ  D:  09/05/2009  T:  09/06/2009  Job:  [id number]

## 2011-02-27 NOTE — Assessment & Plan Note (Signed)
OFFICE VISIT   KYLEN, ISMAEL A  DOB:  1946-08-06                                       07/22/2009  ZOXWR#:[id number]   The patient presents today for followup of her left foot.  She had  undergone an amputation of her 3rd toe metatarsal head and 2nd  metatarsal head by Dr. Myra Gianotti on 9/16, and she had had progressive  infection.  She has continued on Zosyn through a PICC line.  Her foot  actually looks quite good today with an excellent granulating base in  the wound.  She continues to have a palpable fem-anterior graft pulse.  She will continue her local wound care and will see me again in 1 month.   Larina Earthly, M.D.  Electronically Signed   TFE/MEDQ  D:  07/22/2009  T:  07/26/2009  Job:  [id number]   cc:   [name]. Excell Seltzer, MD  Pricilla Riffle, MD, [facility]  Leonides Grills, M.D.

## 2011-02-27 NOTE — Discharge Summary (Signed)
NAMECARALEE, MOREA           ACCOUNT NO.:  [id number]   MEDICAL RECORD NO.:  [id number]          PATIENT TYPE:  INP   LOCATION:  2039                         FACILITY:  [facility]   PHYSICIAN:  Larina Earthly, M.D.    DATE OF BIRTH:  1946-05-22   DATE OF ADMISSION:  04/19/2009  DATE OF DISCHARGE:  04/26/2009                               DISCHARGE SUMMARY   ADMISSION DIAGNOSIS:  Peripheral vascular disease with severe ischemic,  left foot with gangrene involving the left fifth toe.   FINAL DISCHARGE DIAGNOSIS:  1. Peripheral vascular disease with severe ischemia, left foot with      gangrene of left fifth and fourth toes status post left femoral to      anterior tibial artery bypass with open ray amputation of the left      fourth and fifth toes (cultures showed abundant group B strep and      the patient started on Augmentin).  2. Insulin-dependent diabetes mellitus since childhood.  3. Coronary artery disease.  4. History of coronary artery bypass grafting by Dr. Sheliah Plane      in 1992 with vein harvesting from her right leg.  She had a recent      cardiac catheterization revealing occlusion of two of her four      bypasses with good collateral formation.  5. History of breast cancer with treatment in 1984.  6. Hypertension.  7. Hypercholesterolemia.  8. History of prior myocardial infarction.  9. Postoperative chest pain, resolved with normal troponin.  10.Postoperative anemia.  11.Renal renal insufficiency with admission creatinine of 1.55 and      discharge creatinine of 1.77.   ALLERGIES:  1. KEFLEX.  2. CIPRO.  3. ADHESIVE TAPE.   CONSULTATIONS:  1. Cardiology.  2. Cardiac Rehab.  3. Physical Therapy.   PROCEDURES:  April 19, 2009 left femoral to anterior tibial artery bypass  using translocated nonreverse saphenous vein graft and open ray  amputation left fourth and fifth toes by Dr. Tawanna Cooler Early.   BRIEF HISTORY:  Ms. Spoon is a 65 year old Caucasian  female with  juvenile onset insulin-dependent diabetes mellitus.  She presented to  the [facility] office with progressive dry gangrene changes of her left fifth  toe.  She underwent arteriography which revealed severe tibial disease  with also subtotal occlusion of her superficial femoral artery.  It was  recommended that she undergo a left femoral to tibial artery bypass as  well as toe amputation for limb salvage.   HOSPITAL COURSE:  Ms. Spidle was electively admitted to [facility].  She underwent the previously mentioned procedure.  She was  extubated neurologically intact and after short-stay in Recovery Unit,  she was transferred to the Surgical Intensive Care Unit as no Step-Down  beds were available.  That night she did complain of some chest  pressure.  She was maintaining sinus rhythm.  Pain was relieved with  Dilaudid.  She was feeling anxious, however, we did order some cardiac  enzymes and ultimately consulted [facility].  She was seen by  Dr. Olga Millers who felt that if enzymes  were negative, just to  continue medical therapy.  She was kept on IV vancomycin until  intraoperative cultures were known.  Ultimately, this grew no anaerobes  but abundant group B strep.  She was ultimately changed to Augmentin.  We did perform b.i.d. normal saline wet-to-dry dressings to her toe  amputation site and it was felt that overall she was having good  granulation tissue formation.  She did have postoperative ABIs on April 21, 2009, however, with a preliminary report showing on the right ABI  0.83, a TBI 0.52 and on the left a TBI of 0.24.  During this  hospitalization, she was seen by Cardiac Rehab as well as Physical  Therapy.  They did feel that the patient should be seen by Home Health  physical therapy.  This was arranged as well as Home Health nursing to  assist with dressing changes.  The next several days were spent  primarily working on her mobility.  Pain was also  somewhat of an issue  and we did start some Neurontin which seemed to help.  She did require  some insulin adjustment due to both hypo and hyperglycemia this  admission but was ultimately discharged on her home regimen.  Ultimately, Ms. Bayon was felt appropriate for discharge home on April 26, 2009 in stable and improving condition.   RECENT LABORATORY DATA:  Cultures as mentioned above.  Her white count  was 7.9, hemoglobin of 7.7, and hematocrit of 22.6 on April 25, 2009.  [name] opted to monitor this as the patient was asymptomatic.  Platelet count 317.  Sodium of 135, potassium of 5.1, glucose of 60, BUN  of 27, creatinine of 1.77, and calcium of 8.8.   DISPOSITION:  Ms. Ignatowski was discharged home on April 26, 2009 in stable  and improving condition and showing good signs of healing from her toe  amputation site.  She also had a palpable graft pulse.  Toe incisions  were healing well without signs of infection.   DISCHARGE MEDICATIONS:  1. Lantus insulin 50 units subcutaneously q.a.m. and 17 units      subcutaneously q.p.m.  2. Humalog insulin sliding scale with meals per home regimen.  3. Levothyroxine 100 mcg p.o. q.a.m.  4. Metoprolol 25 mg p.o. q.a.m.  5. Aspirin 325 mg p.o. q.a.m.  6. Lasix 40 mg p.o. q.a.m.  7. Lipitor 40 mg p.o. at bedtime.  8. Zetia 10 mg p.o. at bedtime.  9. Accupril 10 mg p.o. at bedtime.  10.Darvocet-N 100 one tablet p.o. p.r.n. for pain per home regimen.  11.Oxycodone 5 mg 1-2 tablets p.o. q.4 h. p.r.n. for pain.  She was      instructed not to take this within 4 hours of her Darvocet.  12.Neurontin 100 mg p.o. b.i.d.  13.Augmentin 500 mg p.o. b.i.d. x10 more days.   DISCHARGE INSTRUCTIONS:  Continue a diabetic appropriate diet.  May  shower and clean incisions gently with soap and water.  Home Health  nurses to assist with left foot dressing changes, normal saline wet-to-  dry b.i.d.  Home Health nurses also to remove her leg staples on  May 03, 2009.  She will see [name] in 2-3 weeks with postoperative ABIs.  She will call sooner if she has fever greater than 101, redness or  drainage from her incision  sites or increased pain.  She is to ambulate with a Darco shoe to her  left foot with pressure on her heel only.  She will increase her  activities as tolerated.  Avoid driving until cleared by [name].  She  is to see Home Health RN and physical therapist..      Jerold Coombe, P.A.      Larina Earthly, M.D.  Electronically Signed    AWZ/MEDQ  D:  04/27/2009  T:  04/28/2009  Job:  [id number]   cc:   Madolyn Frieze. Jens Som, MD, [facility]  Sheliah Plane, MD  [name]. Excell Seltzer, MD  Pricilla Riffle, MD, [facility]  Dorisann Frames, M.D.

## 2011-02-27 NOTE — Assessment & Plan Note (Signed)
OFFICE VISIT   MOLINA, HOLLENBACK A  DOB:  May 13, 1946                                       12/30/2009  EAVWU#:[id number]   Patient presents today for continued followup of her left fem-anterior-  tibial bypass and left partial foot amputation.   She looks quite good today.  She is having continued contraction of her  partially open lateral foot.  The medial portion has healed quite  nicely.  She does have some serous drainage from this and does have some  moderate swelling if she is up on it a long period of time.  She does  have a 2+ palpable graft pulse.   She will continue her local wound care with daily saline dressings, and  I plan to see her again in 6 weeks for continued followup.     Larina Earthly, M.D.  Electronically Signed   TFE/MEDQ  D:  12/30/2009  T:  01/02/2010  Job:  [id number]

## 2011-02-27 NOTE — Assessment & Plan Note (Signed)
OFFICE VISIT   [name], [name] A  DOB:  03-26-1946                                       06/27/2009  ZOXWR#:[id number]   The patient comes back in today with concerns over her left foot wound.  I spoke with her over the weekend.  She stated she that she was having  low-grade fevers, the highest was 100.8.  She also did not feel very  well and she was concerned about her foot.  We tried calling in a  prescription for Augmentin; however, 24 hours later she called me again  and said she was still concerned.  I told her come see me in the office  the next morning.  She comes in today.  Based on Dr. Bosie Helper note, I do  not feel that there has been significant change; however, there is still  some erythema around her forefoot as well as some drainage from her open  wound.  I think it will be best to manage her with IV antibiotics at  least to try to get ahead of this.  She is a brittle diabetic and her  sugars have been way out of control.  I am also going to get an MRI to  make sure there is not residual osteomyelitis.  She is going to be  admitted to the hospital today.   Jorge Ny, MD  Electronically Signed   VWB/MEDQ  D:  06/27/2009  T:  06/28/2009  Job:  [id number]

## 2011-02-27 NOTE — Consult Note (Signed)
NEW PATIENT CONSULTATION   [name], [name] A  DOB:  August 25, 1946                                       04/06/2009  ZOXWR#:[id number]   The patient presents today for evaluation of lower extremity arterial  insufficiency.  She is a very pleasant 65 year old white female with a  long history of insulin dependent diabetes.  She also has a long history  of lower extremity arterial insufficiency with claudication and has  undergone prior noninvasive studies at the [facility] office showing  moderate severe left and moderate right lower extremity arterial  insufficiency.  She had been appropriately counseled to continue her  walking program since she was not having any claudication.  She presents  now with progressive changes of tissue loss on her left fifth toe.  She  reports this initially was a callus and when sloughed she has had  progressive changes of dry gangrene over her left fifth toe.  She does  have chronic fungus infection of both feet with extensive cracking and  dryness and this has not changed.  She does have moderate pain  associated with this as well.  She does not have a prior history of  tissue loss.   PAST MEDICAL HISTORY:  Her past history is significant for coronary  artery disease undergoing a coronary artery bypass grafting by Dr.  Sheliah Plane in 1992.  She had vein harvested from her right leg.  She reports that she has undergone recent cardiac catheterization  revealing occlusion of two of her four bypasses with good collateral  formation.  She also has history of breast cancer treatment in 1984.  She has had previous heart attack.  Does have childhood onset of  diabetes, hypertension and elevated cholesterol and previous heart  attack.   SOCIAL HISTORY:  She is single.  She works as an Recruitment consultant.  She does not smoke or drink alcohol.   REVIEW OF SYSTEMS:  Her weight is reported at 175 pounds.  She is 5 feet  3 inches  tall.  She does have shortness of breath with exertion, heart  palpitations, esophageal reflux, history of seizure in February,  arthritis, skin rash.   ALLERGIES:  Keflex, Cipro, adhesive tape and eye antibiotic.   MEDICATIONS:  Lantus insulin daily, Humalog insulin sliding scale,  Lipitor, Zetia, Accupril, levothyroxine, metoprolol, Lasix, daily  aspirin therapy.   PHYSICAL EXAMINATION:  General:  A well-developed, well-nourished white  female appearing stated age of 65.  Vital signs:  Her blood pressure is  141/58, pulse 70, respirations 18.  Her radial pulses are 2+.  She does  have 2+ femoral pulses.  She has absent popliteal and distal pulses.  Heart:  Regular rate and rhythm.  Abdomen:  Her abdominal exam reveals  moderate obesity and no tenderness.  Extremities:  She has dry gangrene  of her left fifth toe extending up to her toe crease.  She does not have  any involvement of her metatarsal head currently.   I do have a copy of noninvasive studies from the [place] office showing  lower extremity arterial insufficiency bilaterally.  I had an extremely  long discussion with the patient.  I explained that this is clearly a  limb threatening problem with inadequate flow to her left foot for  healing.  I explained that toe amputation simply would not  heal due to  the inadequate flow and she has developed this due to inadequate flow  and would certainly not have enough flow to heal an amputation.  I have  recommended arteriography for further evaluation and explained that she  will probably require left femoral to popliteal or left femoral to  tibial bypass depending on her outflow.  I explained that it is possible  that she would have a lesion amenable to stenting but this would be  quite unlikely with her critical ischemia.  I have recommended that we  proceed as soon as possible with arteriography and revascularization.  She reports that it takes her some time to process medical  information  and she is not willing to proceed before Monday for her arteriogram.  I  offered immediate reconstruction to follow this on Tuesday.  She is not  willing to proceed with this.  I explained that in all likelihood she  will remain relatively stable until she has progressive tissue loss but  that it is impossible to determine if this would happen early or late.  She has consented to arteriography and we will arrange this with Dr.  Edilia Bo on Monday 06/28 and I will see her in the office on 06/30 for  further discussion and planning.   Larina Earthly, M.D.  Electronically Signed   TFE/MEDQ  D:  04/06/2009  T:  04/07/2009  Job:  [id number]   cc:   Joanne Gavel, M.D.  [name]. Excell Seltzer, MD  Pricilla Riffle, MD, [place]  Dorisann Frames, M.D.

## 2011-02-27 NOTE — Assessment & Plan Note (Signed)
OFFICE VISIT   ANJELI, CASAD A  DOB:  14-Oct-1946                                       05/27/2009  ZOXWR#:[id number]   The patient presents today for continued followup of her left femoral to  anterior tibial bypass and open ray amputation of fourth and fifth toe.  Surgery was on 04/19/2009 and she is having excellent graft pulse in a  well-perfused foot.  Her open toe amputation over the dorsal 2/3 has an  excellent granulating base.  She continues to have undermining on the  pad of her foot and also on the most extreme lateral portion she does  have some tenderness and some undermining there as well.  I debrided  this sharply in the office.  She will continue b.i.d. saline dressing  changes.  I will see her again in 1 week for continued followup.  I did  discuss the potential need for a return to the operating room for more  aggressive debridement.  We also discussed a VAC and I explained that  she certainly is no where near this due to the undermining on the  underside of the foot.  We will see her again in 1 week for continued  followup.   Larina Earthly, M.D.  Electronically Signed   TFE/MEDQ  D:  05/27/2009  T:  05/28/2009  Job:  [id number]

## 2011-02-27 NOTE — Assessment & Plan Note (Signed)
OFFICE VISIT   [name], [name]  DOB:  May 09, 1946                                       10/21/2009  EAVWU#:[id number]   The patient presents today for followup of her partial foot amputation.  Since my last visit with her on 12/27, she has had progressive tissue  loss over the medial aspect of her great toe metatarsal head.  There is  surrounding erythema, and this extended down to the web space in the  plantar aspect.  She does have some surrounding erythema and mild to  moderate tenderness.  She does have palpable dorsalis pedis pulse.  I  had [name] long discussion with the patient.  I have recommended that she  proceed with [name] formal transmetatarsal amputation of her great toe and  second toe, essentially giving her [name] formal transmet on her foot.  I  explained the option would be continued observation but since he has had  marked progression, I feel this would be unsafe.  I did explain the  option of [name] below-knee amputation and felt this would be not necessary  at this point. She understands that she may have nonhealing and progress  to this but for now I would recommend amputation of her right great and  second toe. This will be done at [facility] on 10/25/2009.     Larina Earthly, M.D.  Electronically Signed   TFE/MEDQ  D:  10/21/2009  T:  10/24/2009  Job:  [id number]

## 2011-02-27 NOTE — Procedures (Signed)
BYPASS GRAFT EVALUATION   INDICATION:  Follow up left SFA to anterior tibial artery bypass.   HISTORY:  Diabetes:  Yes.  Cardiac:  No.  Hypertension:  Yes.  Smoking:  No.  Previous Surgery:  Left SFA to ATA bypass and toe amputations.   SINGLE LEVEL ARTERIAL EXAM                               RIGHT              LEFT  Brachial:                    171                175  Anterior tibial:             123                121  Posterior tibial:            146                131  Peroneal:  Ankle/brachial index:        0.83               0.75   PREVIOUS ABI:  Date: 09/05/09  RIGHT:  0.64  LEFT:  0.61   LOWER EXTREMITY BYPASS GRAFT DUPLEX EXAM:   DUPLEX:  Patent left SFA to anterior tibial bypass graft with increased  velocity noted at a distal anastomosis.   IMPRESSION:  1. Patent superficial femoral artery to anterior tibial artery bypass      graft with increased velocity noted at distal anastomosis.  2. Mildly abnormal ankle brachial index with biphasic Doppler      waveforms noted in bilateral legs.  3. Status post left superficial femoral artery to anterior tibial      artery bypass graft.   ___________________________________________  Larina Earthly, M.D.   MG/MEDQ  D:  12/30/2009  T:  12/30/2009  Job:  [id number]

## 2011-02-27 NOTE — Assessment & Plan Note (Signed)
OFFICE VISIT   KADEISHA, BETSCH A  DOB:  Oct 17, 1945                                       01/20/2010  ZOXWR#:[id number]   HISTORY OF PRESENT ILLNESS:  The patient is a 65 year old female patient  of [name] who underwent a left femoral to anterior tibial bypass with  saphenous vein and open ray of her fourth and fifth toe for severe  ischemia who in January of this year had transmetatarsal amputation.  She has had an open wound which is slowly granulating.  She comes in  today with a loose callus over the wound and wishes to have the callus  removed.   PHYSICAL EXAM:  This is a well-developed, well-nourished woman in no  acute distress.  Her respiratory rate was 12.  Her sats were 96% and her  heart rate was 80.  Left lower extremity transmet amputation was healing  well.  She had an approximately 1.5 x 1 cm open wound on the lateral  aspect which had a dry callus which was hanging by very thin thread from  the wound.  This was removed without difficulty.  The wound beneath was  clean and dry and healing well.  New dressing was applied.  She has an  appointment to follow up with [name] in a couple of weeks.   Della Goo, PA-C   Larina Earthly, M.D.  Electronically Signed   RR/MEDQ  D:  01/20/2010  T:  01/21/2010  Job:  [id number]

## 2011-02-27 NOTE — Op Note (Signed)
NAMESUMAN, TRIVEDI           ACCOUNT NO.:  [id number]   MEDICAL RECORD NO.:  [id number]          PATIENT TYPE:  INP   LOCATION:  2307                         FACILITY:  [facility]   PHYSICIAN:  Larina Earthly, M.D.    DATE OF BIRTH:  05/16/1946   DATE OF PROCEDURE:  04/19/2009  DATE OF DISCHARGE:                               OPERATIVE REPORT   PREOPERATIVE DIAGNOSIS:  Severe ischemia, left foot with gangrene of  left fifth toe.   POSTOPERATIVE DIAGNOSIS:  Severe ischemia, left foot with gangrene of  left fifth toe with infection and abscess at the base of the left fourth  toe as well.   PROCEDURE:  1. Left femoral-to-anterior tibial bypass with translocated      nonreversed saphenous vein.  2. Open ray amputation of left fourth and fifth toe.   SURGEON:  Larina Earthly, MD   ASSISTANT:  Jerold Coombe, [facility]   ANESTHESIA:  General endotracheal.   COMPLICATIONS:  None.   DISPOSITION:  To recovery room, stable.   INDICATIONS FOR PROCEDURE:  The patient is a 65 year old white female  with a juvenile-onset insulin-dependent diabetes.  She presented to the  office with progressive dry gangrenous changes of her left fifth toe.  She underwent arteriography and this revealed severe tibial disease with  also subtotal occlusion or superior femoral artery.  It was recommended  that she undergo femoral to anterior tibial bypass and toe amputation  for limb salvage.  The procedure was explained to the patient including  the risk of graft occlusion and also the risk of limb loss even with  graft patency.  She was taken to the operating at this time.   PROCEDURE IN DETAIL:  The patient was taken to the operating room and  placed in supine position.  The area of the left groin, left leg, and  left foot were prepped and draped in the usual sterile fashion.  Incision was made over the left femoral pulse, carried down to isolate  the common superficial femoral and profunda femoris  arteries.  The  patient has relatively small vessels.  There was extensive posterior  plaque, but vessels were patent.  The saphenous vein was exposed at the  saphenofemoral junction.  Tributary branches were ligated with 3-0 and 4-  0 silk ties and divided.  The vein was harvested from the level of the  groin to the distal calf using several skin bridges.  The vein was of  adequate caliber.  The saphenofemoral junction was occluded with the  Cooley clamp, and the saphenous vein was excised from the saphenofemoral  junction was then oversewn with a 5-0 Prolene suture.  The vein gently  dilated and found to be acceptable caliber.  Next, the anterior tibial  artery was exposed with a separate incision over the lateral mid calf.  The artery extensively calcified did have a soft anterior surface.  A  tunnel was created from the level of the anterior tibial to the femoral  artery.  The patient was given 8000 units of intravenous heparin.  After  adequate circulation time, the common, superficial femoral,  and profunda  femoris arteries were occluded.  The common femoral arteries were opened  with an 11 blade and extended longitudinally with Pott scissors.  The  saphenous vein was brought on to the field and was spatulated and sewn  end-to-side to the junction of the common superficial femoral artery  with a running 6-0 Prolene suture.  The anastomosis was tested and found  to be adequate.  The vein was then brought through the prior created  tunnel.  Taking care not to twist the vein.  Next, the Webril and  pneumatic tourniquet was placed in the above-knee position.  The  patient's leg was elevated and exsanguinated with an Esmarch tourniquet  and then pneumatic tourniquet was inflated to 250 mmHg.  Anterior tibial  artery was opened on the anterior surface and extended with Pott  scissors.  Two dilators were passed through the artery.  The vein was  brought to proximation and was cut to  appropriate length, was spatulated  and sewn end-to-side to the artery with a running 6-0 Prolene suture.  Again 2 dilator passed proximally and distally prior to completion of  the anastomosis.  The tourniquet was deflated, the usual flushing  maneuvers were undertaken.  The anastomosis was completed.  Graft-  dependent Doppler flow was noted at the anterior tibial at the ankle.  The patient was given 50 mg of protamine to reverse the heparin.  It  should be mentioned that all vein valves were lysed with Damita Lack.  The wounds were irrigated with saline.  Hemostasis with  electrocautery.  Wounds were closed with 3-0 Vicryl in the subcutaneous  tissue.  Skin was closed with skin clips.  The groin was closed with  several layers of 2-0 Vicryl and the subcutaneous tissue, and the groin  was closed with the skin clips.  The patient had extensive edema fluid  present.  Next, the attention was turned to the left foot.  The fifth  toe was amputated in a ray fashion taking the metatarsal head.  On  further debridement of necrotic tissue at the base of the fifth  metatarsal head, I entered the space at the fourth metatarsal bone with  the frank pus present.  For this reason, I did a ray amputation of the  fourth toe as well resecting the metatarsal head and the portion of the  metatarsal bone, and no further pus was encountered.  The wound was  irrigated with saline and was packed open with 4 x 4, soaked in saline.  Sterile dressing was applied.  The patient was taken to the recovery  room in stable condition.      Larina Earthly, M.D.  Electronically Signed     TFE/MEDQ  D:  04/19/2009  T:  04/20/2009  Job:  [id number]

## 2011-02-27 NOTE — Op Note (Signed)
[name], [name]           ACCOUNT NO.:  [id number]   MEDICAL RECORD NO.:  [id number]          PATIENT TYPE:  OIB   LOCATION:  5009                         FACILITY:  [facility]   PHYSICIAN:  Madelynn Done, MD  DATE OF BIRTH:  01/13/1946   DATE OF PROCEDURE:  06/25/2008  DATE OF DISCHARGE:                               OPERATIVE REPORT   PREOPERATIVE DIAGNOSIS:  Right hand infection.   POSTOPERATIVE DIAGNOSIS:  Right hand infection.   ATTENDING SURGEON:  Madelynn Done, MD, who was scrubbed and present  for the entire procedure.   ASSISTANT SURGEON:  None.   SURGICAL PROCEDURE:  Irrigation and debridement of skin and subcutaneous  tissue and drainage of right hand infection.   ANESTHESIA:  General via laryngeal mask airway.   DRAINS:  Two blue vessel loops as drains.   TOURNIQUET TIME:  0 minutes.   INTRAOPERATIVE FINDINGS:  The patient's wound did appear better.  She  still had a lot of erythema over the palm of her hand, but she did not  have any purulence and no fluid collections noted.   SURGICAL INDICATIONS:  [name] is a 65 year old diabetic female who  has had 2 previous operations for infection of her right hand.  Base on  the severity and MRSA infection, it was recommended that she undergo  repeat incision and drainage of her right hand for the treatment of her  ongoing infection.  Risks, benefits, and alternatives were discussed in  detail with the patient and signed informed consent was obtained on the  day of surgery.   DESCRIPTION OF PROCEDURE:  The patient was properly identified in the  preoperative holding area, and a mark was made on the right hand to  indicate the correct operative site.  The patient was then brought back  to the operating room, placed supine on the anesthesia room table.  General anesthesia was administered via LMA.  A well-padded tourniquet  was then placed on the right brachium and sealed with a 1000 drape.  The  right  upper extremity was then prepped and draped in normal sterile  fashion.  A time out was called.  The correct site was identified and  the procedure was then begun.  Attention was turned to the right hand.  Previous sutures were then removed.  The wound was then opened up and  careful attempt was made to the ulnar neurovascular bundle.  A 6 L of  saline solution was then irrigated thoroughly throughout the hand.  Again, there was not any purulence.  Debridement of the skin and  subcutaneous tissue were then carried out at the fibrinous exudate and  necrotic tissue.  After thorough debridement and irrigation, two vessel  loop drains were than placed in the hands.  The wound was then closed  over the drains with 4-0 nylon suture.  Adaptic dressing and sterile  compressive dressing were then applied.  The patient was then placed in  a well-padded volar splint.  She was then extubated and taken to  recovery room in good condition.  The patient tolerated the procedure  well without any intraoperative complications.   POSTOPERATIVE PLAN:  The patient is being admitted for continued IV  antibiotics and evaluation.  She will be reexamined in 48 hours.  If she  improves clinically, we will discharge her to home.  If she has a  worsening pain or symptoms, she may require further I&D.      Madelynn Done, MD  Electronically Signed     FWO/MEDQ  D:  06/25/2008  T:  06/26/2008  Job:  [id number]

## 2011-02-27 NOTE — Op Note (Signed)
[name], [name]           ACCOUNT NO.:  [id number]   MEDICAL RECORD NO.:  [id number]          PATIENT TYPE:  AMB   LOCATION:  SDS                          FACILITY:  [facility]   PHYSICIAN:  Larina Earthly, M.D.    DATE OF BIRTH:  [birth year]   DATE OF PROCEDURE:  DATE OF DISCHARGE:  06/09/2009                               OPERATIVE REPORT   PREOPERATIVE DIAGNOSIS:  Open wound left foot with continued necrotic  debris.   POSTOPERATIVE DIAGNOSIS:  Open wound left foot with continued necrotic  debris.   PROCEDURE:  Extensive debridement of left foot and VAC placement.   SURGEON:  Larina Earthly, M.D.   ASSISTANT:  Nurse.   ANESTHESIA:  LMA.   COMPLICATIONS:  None.   DISPOSITION:  To recovery room stable.   PROCEDURE IN DETAIL:  The patient was taken to the operating      room,  placed supine position where the area of the foot was prepped in the  usual sterile fashion. The patient had an open fourth and fifth toe  amputation and there was necrotic debris at the extreme lateral portion  of the ray amputation.  This area was unroofed by resecting the skin  over this and also resecting the skin on the plantar aspect of the open  area.  The fifth metatarsal bone was resected further proximally.  All  nonviable tissue was debrided with several tendons at the level of the  third, fourth, and fifth toe debrided back further proximally.  There  was good bleeding with no evidence of any contained pocket.  After the  full debridement, the wound was irrigated with saline and hemostasis  obtained with electrocautery.  A decision was made to place the [facility] over  the wound and this was accomplished in the operating room.  The patient  tolerated the procedure without any complication and was transferred to  recovery in stable condition.      Larina Earthly, M.D.  Electronically Signed     TFE/MEDQ  D:  06/09/2009  T:  06/10/2009  Job:  [id number]

## 2011-02-27 NOTE — Assessment & Plan Note (Signed)
OFFICE VISIT   [name], [name]  DOB:  [birth year]                                       11/18/2009  AOZHY#:[id number]   This patient presents today for postop follow-up from her left  transmetatarsal amputation on 10/25/2009.  She looks extremely good  today.  She continues to have [name] palpable fem-anterior tibial graft pulse  in the lateral aspect of the foot.  The open area of her prior fourth  and fifth toe open transmetatarsal amputation continues to healing and  contract with [name] very small area that continues to granulate quite  nicely.  The area where she had [name] formal amputation of her great second  toe is healing quite nicely and she had [name] portion of her sutures removed  today.  There are several large mattress sutures were left intact for an  additional 2 weeks.  Plan to see again in 2 weeks for continued follow-  up.  She will continue her Primaxin as is currently dosed via her PICC  line for an additional 2 weeks and then we will discontinue it at that  time.     Larina Earthly, M.D.  Electronically Signed   TFE/MEDQ  D:  11/18/2009  T:  11/21/2009  Job:  [id number]

## 2011-02-27 NOTE — Consult Note (Signed)
[name], [name]           ACCOUNT NO.:  [id number]   MEDICAL RECORD NO.:  [id number]          PATIENT TYPE:  INP   LOCATION:  2039                         FACILITY:  [facility]   PHYSICIAN:  Madolyn Frieze. Jens Som, MD, FACCDATE OF BIRTH:  May 16, 1946   DATE OF CONSULTATION:  04/20/2009  DATE OF DISCHARGE:                                 CONSULTATION   HISTORY OF PRESENT ILLNESS:  [name] is a pleasant 65 year old  female with a past medical history of coronary artery disease status  post coronary bypassing graft, diabetes, hypertension, hyperlipidemia,  hypothyroidism, history of breast cancer status post left mastectomy,  peripheral vascular disease, scleroderma who I am asked to evaluate for  chest pain.  Note, the patient's last cardiac catheterization was  performed in April 2010.  At that time, she was found to have patency of  the LAD of the LIMA to the LAD.  There was also a patency of saphenous  vein graft to a large marginal.  The saphenous vein graft to the distal  circumflex was occluded.  There was a moderately high-grade stenosis of  the AV circumflex more with distal stenoses involving a severely  diseased marginal branch, not amenable to PCI.  It was therefore felt  that medical therapy was indicated.  Her last echocardiogram was  performed in March 2010.  She had normal LV function and mild aortic  stenosis.  She also has significant peripheral vascular disease.  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  Last evening, she  complained of mild heaviness in her chest.  It did not radiate.  It was  not associated with nausea, vomiting, shortness of breath, or  diaphoresis.  It was not pleuritic or positional nor was it related to  food.  It was unlike her previous cardiac pain.  She is at present pain  free.  Because of the above, we were asked to further evaluate.   PAST MEDICAL HISTORY:   Significant for diabetes, hypertension, and  hyperlipidemia.  She also has hypothyroidism.  She has a history of  coronary disease and has had prior coronary bypassing graft as described  in the HPI.  She has had previous breast cancer and has had prior left  mastectomy.  She also has a history of mild renal insufficiency as well  as scleroderma.  She has had carpal tunnel surgery as well as  appendectomy and tonsillectomy.  She has adverse reaction to  ciprofloxacin and Keflex.   SOCIAL HISTORY:  She does not smoke nor does she consume alcohol.  She  is single.   FAMILY HISTORY:  Significant for strokes but no coronary artery disease.   REVIEW OF SYSTEMS:  She denies any headaches, but she has had some  fevers at home.  There are no chills.  There is no productive cough or  hemoptysis.  There is no dysphagia, odynophagia, melena, or  hematochezia.  There is no dysuria or hematuria.  No rash or seizure  activity.  There is no orthopnea or  PND and there is no pedal edema.  Remaining systems are negative.   PHYSICAL EXAMINATION:  VITAL SIGNS:  Today shows a blood pressure of  130/56 and her pulse is 92.  GENERAL:  She is well-developed, well-nourished, and in no acute  distress.  SKIN:  Warm and dry.  She does not appear to be depressed.  There is no  peripheral clubbing.  BACK:  Normal.  HEENT:  Normal with normal eyelids.  NECK:  Supple with a normal upstroke bilaterally.  There are bilateral  carotid bruits.  There is no jugular distention and I cannot appreciate  thyromegaly.  CHEST:  Clear to auscultation.  Normal expansion.  CARDIOVASCULAR:  Regular rate and rhythm with normal S1-S2.  There is a  2-3/6 systolic murmur at the left sternal border.  S2 is preserved.  She  is status post left mastectomy.  ABDOMEN:  Nontender and nondistended.  Positive bowel sounds.  No  hepatosplenomegaly.  No mass appreciated.  There is no abdominal bruit.  She has 1+ femoral pulses  bilaterally.  She is status post recent  procedure on the left.  EXTREMITIES:  Trace edema bilaterally.  She has had a recent surgery.  Her distal pulses are not palpable.  NEUROLOGIC:  Grossly intact.   LABORATORY DATA:  CK of 139 with an MB of 5.6 and a troponin I of 0.04.  Previous cardiac markers showed a troponin I of 0.02.  Her sodium is 128  with potassium 5.6.  BUN and creatinine are 24 and 1.4.  Her white blood  cell count is 15.8 with a hemoglobin of 9.4 and hematocrit of 27.4.  Platelet count is 293.  Chest x-ray shows no acute disease.  Electrocardiogram shows a sinus rhythm with anterolateral T-wave  inversion.   DIAGNOSES:  1. Chest pain - the patient's symptoms are atypical and unlike her      previous cardiac pain.  I would continue to cycle enzymes.  If they      are negative, then I would not pursue further cardiac workup as she      had a recent cardiac catheterization in April as described above.      Medical therapy was recommended.  We would recommend continuing      aspirin, Lopressor, ACE inhibitor, and statin.  2. Diabetes mellitus.  3. Hypertension - blood pressure is controlled on her present      medications.  4. History of hypothyroidism.  Plan:  Continue present medications.  5. History of coronary artery disease status post coronary bypassing      graft.  6. Hyperlipidemia - continue statin.  7. Peripheral vascular disease - management per CVTS.  8. History of breast cancer.      Madolyn Frieze Jens Som, MD, [facility]  Electronically Signed     BSC/MEDQ  D:  04/20/2009  T:  04/21/2009  Job:  [id number]

## 2011-02-27 NOTE — Assessment & Plan Note (Signed)
OFFICE VISIT   [name], [name] A  DOB:  10-18-1945                                       06/24/2009  JXBJY#:[id number]   The patient presents today for continued follow-up of her open left  foot.  She has a palpable fem anterior tibial pulse.  She had been using  the VAC.  She does have a fair amount of necrotic debris in the base of  this, this was debrided.  I discussed this with the patient and I feel  that she needs to go back to wet-to-dry dressing since I do not feel  that the [facility] is accomplishing our goal of getting this to close down  with the amount of debris present.  She understands and we will get her  back to b.i.d. normal saline wet-to-dry dressings and plan to see her  again in 2 weeks for continued follow-up.   Larina Earthly, M.D.  Electronically Signed   TFE/MEDQ  D:  06/24/2009  T:  06/24/2009  Job:  [id number]

## 2011-02-27 NOTE — Op Note (Signed)
[name], [name]           ACCOUNT NO.:  [id number]   MEDICAL RECORD NO.:  [id number]          PATIENT TYPE:  AMB   LOCATION:  SDS                          FACILITY:  [facility]   PHYSICIAN:  Di Kindle. Edilia Bo, M.D.DATE OF BIRTH:  [birth year]   DATE OF PROCEDURE:  04/11/2009  DATE OF DISCHARGE:  04/11/2009                               OPERATIVE REPORT   PREOPERATIVE DIAGNOSIS:  Nonhealing wound of the left foot.   POSTOPERATIVE DIAGNOSIS:  Nonhealing wound of the left foot.   PROCEDURES:  1. Ultrasound-guided access to the right common femoral artery.  2. Aortogram with bilateral iliac arteriogram.  3. Selective catheterization of the left external iliac artery with      left lower extremity runoff.  4. Right lower extremity runoff.   SURGEON:  Di Kindle. Edilia Bo, MD   ANESTHESIA:  Local with sedation.   INDICATIONS:  This is a 65 year old woman, who had been evaluated by Dr.  Arbie Cookey in the office with a nonhealing wound of the left foot.  She had  evidence of significant infrainguinal arterial occlusive disease on exam  and was brought in for diagnostic arteriography to see if she was a  candidate for revascularization.  The procedure and potential  complications including, but not limited to bleeding, arterial injury,  and renal insufficiency were discussed with the patient.  All her  questions were answered and she was agreeable to proceed.   TECHNIQUE:  The patient was taken to the operating room where she was  sedated with 1 mg of Versed and 50 mcg of fentanyl.  Both groins were  prepped and draped in usual sterile fashion.  After the skin was  anesthetized with 1% lidocaine and under ultrasound guidance, the right  common femoral artery was cannulated and the guidewire introduced into  the infrarenal aorta under fluoroscopic control.  Of note, there was  significant scar tissue in the right groin from previous  catheterizations.  A 5-French sheath was  introduced over the wire.  A  pigtail catheter was positioned at the L1 vertebral body and flush  aortogram obtained.  The catheter was then repositioned above the aortic  bifurcation and oblique iliac projections were obtained.  The pigtail  catheter was exchanged for a crossover catheter, which was positioned  into the left common iliac artery.  An angled Glidewire was advanced  down into the common femoral artery on the left and then the crossover  catheter exchanged for an end-hole catheter.  Selective left external  iliac arteriogram was obtained with left lower extremity runoff.  Additional spot films were obtained in the left leg.  The end-hole  catheter was then removed, and the right lower extremity films obtained  through the right femoral sheath.   FINDINGS:  There is a single renal artery on the right and accessory  renal artery on the left.  The infrarenal aorta is patent as is the  common iliac and external iliac arteries.  The hypogastric arteries are  patent bilaterally.  There is no significant aortoiliac occlusive  disease, although the infrarenal aorta is somewhat small.   On the left  side the common femoral and deep femoral artery are patent.  The proximal superficial femoral artery is patent.  There is a moderate  50% stenosis in the mid segment of the superficial femoral artery and  then some mild diffuse disease below this in an area of more significant  stenosis at the adductor canal producing up to a 90% stenosis.  The  above-knee popliteal artery is patent and then the popliteal artery is  occluded at the level of the knee.  Of note, there is a high aberrant  takeoff of the anterior tibial artery on the left with some proximal  disease, but the artery is patent beyond this, although it has moderate  diffuse disease throughout and especially more diffuse disease distally.  The dorsalis pedis is patent, although it is small and diffusely  diseased.  Tibioperoneal  trunk, peroneal and posterior tibial arteries  are occluded on the left.  There is reconstitution of a small peroneal  artery distally.   On the right side, the common femoral, superficial femoral, and deep  femoral arteries are patent.  There is moderate diffuse disease  throughout the distal superficial femoral artery on the right.  The  popliteal artery is patent and there is two-vessel runoff on the right  via the anterior tibial and peroneal arteries.  There is an occlusion of  the posterior tibial artery on the right and there is diffuse disease of  the tibioperoneal trunk on the right.   CONCLUSIONS:  1. Bilateral superficial femoral artery occlusive disease as described      above.  2. 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      Di Kindle. Edilia Bo, M.D.  Electronically Signed     CSD/MEDQ  D:  04/11/2009  T:  04/11/2009  Job:  [id number]

## 2011-02-27 NOTE — Assessment & Plan Note (Signed)
[facility]                            CARDIOLOGY OFFICE NOTE   NAME:[name], [name]                  MRN:          [id number]  DATE:07/07/2007                            DOB:          Jan 12, 1946    IDENTIFICATION:  [name] is a 65 year old woman, followed by E.  Graceann Congress, MD, [facility].   She has a history of:  1. Diabetes.  2. CAD.  3. Hypertension.  4. Dyslipidemia.  5. Status post left mastectomy.   She was last seen in June.  After [name] saw her, he scheduled an adenosine  Myoview and a lab evaluation.  The patient had the Myoview scan done on  July 8th.  This showed old scar at the inferior apex.  There is also  some moderate ischemia in the inferior wall that again was old (no  change from scan of 2005).   The patient also had a recent cholesterol panel done that showed a total  cholesterol of 131, HDL of 37, LDL of 71, triglycerides of 117.  Note,  the patient is back on her Zetia and Lipitor.  She denies chest pain,  has some shortness of breath that she thinks is due to weight, currently  planning to re-enter exercise/weight loss program.   CURRENT MEDICATIONS:  1. Lipitor 40.  2. Zetia 10.  3. Accupril 20.  4. Synthroid 75.  5. Macrobid 100.  6. Aspirin 325.  7. Toprol XL 25.   PHYSICAL EXAMINATION:  GENERAL:  The patient is in no distress.  VITAL SIGNS:  Blood pressure is 122/49, pulse is 68, weight is 179.  LUNGS:  Clear.  NECK:  JVP is normal.  CARDIAC:  Regular rate and rhythm.  S1 S2.  No murmurs.  ABDOMEN:  Benign.  EXTREMITIES:  No edema.   IMPRESSION:  1. Coronary artery disease.  I have reviewed the scan from this summer      and it is a low risk scan.  Again, her catheterization was back it      looks like in 1992.  This showed right coronary artery was      nondominant with an 80-90% lesion.  Circumflex had an 80%.  Obtuse      marginal-1 had an 80% mid.  Second marginal 90%.  Left anterior  descending artery was diffusely diseased with 70% proximal, 70-80%      proximal, 90% distal.  The patient had a coronary artery bypass      graft x4 at that time.  We will plan followup with periodic Myoview      scans as the patient is greater than 16 years out.  2. Aortic stenosis.  Echocardiogram done in August 2007, shows very      mild aortic stenosis, trivial aortic insufficiency.  Will need      periodic followup.  3. Dyslipidemia.  Better control now that she is taking the medicines.     [name] Riffle, MD, [facility]  Electronically Signed    PVR/MedQ  DD: 07/07/2007  DT: 07/07/2007  Job #: [id number]

## 2011-02-27 NOTE — Assessment & Plan Note (Signed)
[facility]                            CARDIOLOGY OFFICE NOTE   NAME:Hitchcock, MEKAELA AZIZI                  MRN:          [id number]  DATE:03/26/2007                            DOB:          04/17/1946    Ms. Freelove is a very pleasant 65 year old white female with a long  history of diabetes, coronary artery disease, hypertension,  hyperlipidemia, and left mastectomy.  She is 15 years post coronary  artery bypass grafting surgery by Dr. Sheliah Plane, at which time the  left internal mammary artery was anastomosed to the LAD, a saphenous  vein graft to the second obtuse marginal, a saphenous vein graft to the  posterior descending.  She has a history of questionable mass in the  pancreatic head.  However, CT scan revealed no mass or lesion.  That was  in 2007.  She has mild aortic stenosis with a 7 mm gradient by 2D echo  done in June of 2007.  The patient has severe poorly controlled  diabetes, and has been followed by Dr. Everardo All.  However, she does not  plan to return and is waiting for Dr. Cheryll Cockayne to give her referral to  another diabetologist.   Most recent stress test in 2005 was non-diagnostic for ischemia.  EF was  49%.  There was a persistent apical defect.   MEDICATIONS:  1. Humalog.  2. Toprol XL, though she states she does not think she has been on it      for a while.  3. Lantus insulin.  4. Accupril 10.  5. Lipitor 40.  6. Zetia 10.  7. Macrobid.  8. Aspirin 325.  9. Levothyroxine 75.  10.Aspirin 81.   PHYSICAL EXAM:  Blood pressure 121/60, pulse 79, normal sinus rhythm.  GENERAL APPEARANCE:  Normal.  JVPs not elevated.  Carotid pulses palpable and equal without bruits.  LUNGS:  Clear.  CARDIAC:  Reveals a 1/6 short systolic murmur in the aortic area.  No  diastolic murmur.  ABDOMEN:  Unremarkable.  EXTREMITIES:  Normal.   DIAGNOSIS:  1. As above.  The patient has coronary artery disease and has done      quite well 15  years post coronary artery bypass grafting.  2. Severe diabetes, which has been poorly controlled.  3. Left mastectomy.   I have suggested she start on Toprol XL 25.  We plan to get an adenosine  Myoview and lipids, and BMP, and I will have her see [name] in 2 to 3  months.   ADDENDUM:  The EKG reveals normal sinus rhythm, old anterior MI,  nonspecific ST/T changes, slightly more prominent since the previous  study of a year ago.     Cecil Cranker, MD, [facility]  Electronically Signed    EJL/MedQ  DD: 03/26/2007  DT: 03/26/2007  Job #: [id number]

## 2011-02-27 NOTE — Assessment & Plan Note (Signed)
OFFICE VISIT   [name], [name]  DOB:  [birth year]                                       08/12/2009  ZOXWR#:[id number]   The patient presents for continued follow up of her left below knee  anterior tib bypass and partial foot amputation.  She had a palpable  graft pulse in the lateral aspect of her calf and a well-perfused foot.  Her open third, fourth and fifth amputation site continues to contract.  I debrided this slightly in the office today.  She will continue b.i.d.  normal saline wet-to-dry dressing changes and has completed 6 weeks of  Zosyn.  I will see her again in one month for continued follow up.   Larina Earthly, M.D.  Electronically Signed   TFE/MEDQ  D:  08/12/2009  T:  08/16/2009  Job:  [id number]

## 2011-02-27 NOTE — Assessment & Plan Note (Signed)
OFFICE VISIT   [name], [name]  DOB:  1946/09/23                                       06/17/2009  ZOXWR#:[id number]   The patient presents today for followup of her left fem anterior tibial  bypass and recent debridement of her open fourth / fifth toe amputation.  She continues to be treated with the [facility] and has less pain than before  the recent debridement.  She still has [name] moderate amount of debris in  the base of her wound and we have been debrided this.  She will continue  with her VAC treatment and I will see her again in 1 week for continued  followup.   Larina Earthly, M.D.  Electronically Signed   TFE/MEDQ  D:  06/17/2009  T:  06/18/2009  Job:  [id number]

## 2011-02-27 NOTE — Assessment & Plan Note (Signed)
[facility]                            CARDIOLOGY OFFICE NOTE   NAME:[name], [name] SCIARA                  MRN:          [id number]  DATE:12/20/2008                            DOB:          December 26, 1945    IDENTIFICATION:  [name] is a 65 year old woman.  She had been  followed by Dr. Glennon Hamilton in the past.  I saw her back in March of  last year.  She has a history of coronary artery disease with remote  bypass in 1992.  This is last time, she had a cardiac catheterization.  Last Myoview scan was in July 2008, that showed small scar in the  inferior apex, moderate inferior ischemia unchanged from previous scans.   The patient was doing okay from a cardiac standpoint.  She did have MRSA  infection of her hand after cat bite.   A few weeks ago, she was driving, she needed to go to a funeral in  [place].  She drove to [facility] to take a flight out [place].  She was  very rushed.  Describes having to park, had trouble getting her bags,  and running late, agitated about this.  She had a hard time finding the  elevator.  She got to the gate and the plane had just arrived, so she  was on time.  The attendant said she could sit down and relax   When she sat down to rest, she started to feel funny with in 10 seconds,  almost like the lights were turning out.  Next thing, she woke up.  She  denied any palpitations with this.  No chest pain.  She just knew  something was not right.   A person sitting near by said her left arm went up into the air, she  starts shaking, her head went back.  When she woke up, she was alert  with in 10 seconds.  They did not report a period of confusion.  There  is no bowel or bladder incontinence.  She was able to talk them and they  letting her go on the plane, she arrived in [place].  She checked her  blood pressure on arrival there, was on the high side.  Note, she had  run out of her beta-blocker couple days before.   Since then, she denies any other spells like this.  She denies chest  pressure.  She did have 1 day where she felt a little weak, lightheaded,  but this was different from the airport.  She has been very anxious.   CURRENT MEDICINES:  1. Lipitor 40.  2. Zetia 10.  3. Aspirin 325.  4. Synthroid 100 mcg.  5. Metoprolol 25 daily.  6. Potassium 20 mEq b.i.d.  7. Lasix 40 b.i.d.  8. Lantus insulin and Humalog as directed.   PHYSICAL EXAMINATION:  GENERAL:  On exam, the patient is in no distress  at rest.  VITAL SIGNS:  Blood pressure is 140/54, pulse is 62 and regular, and  weight 180.4.  NECK:  No audible bruits.  JVP is normal.  LUNGS:  Moving air well.  No rales or wheezes.  CARDIAC:  Regular rate and rhythm, grade 2/6 systolic murmur heard best  at the base.  CHEST:  Status post mastectomy.  ABDOMEN:  Supple and obese.  No hepatomegaly.  EXTREMITIES:  Trace edema.   IMPRESSION:  1. Question seizure/spell.  It does not sound like a seizure on its      own because there was no significant postictal confusion.      Interesting is the timing related to all the rushing around and the      sudden stopping of activity.  I have discussed this with the      patient, I would recommend for now a dobutamine Myoview to see if      there are any changes compared to previous.  Indeed, she may need a      repeat cardiac catheterization, and also, scheduled carotid      Doppler.  She has known mild disease in the past.  In addition, I      will get an echocardiogram to evaluate her aortic valve, though      this stenosis did not sound severe.  I told her to continue on her      medicines as is.  If she has anything concerning, she should call      beforehand whether it is different or not.  2. Hypertension.  We will follow blood pressures have been a little      bit high, but with all of going on, I would not make any switches.      She brings in values of 120-150.  3. Diabetes, poor control  brittle.  4. Hypothyroidism, on replacement.  5. Dyslipidemia.  Continue on Zetia and Lipitor.  We will get a lipid      panel when she comes in for her testing.  6. Peripheral vascular disease.  Again, carotid Dopplers.   She has been seen by Dr. Calton Dach.  Ankle-brachial indexes in the  severe range on the left.  She is being followed for fungal infections  on her feet, but denies any other source of claudication, and is not  debilitating.   I will set tentatively to see the patient back in a few months, but will  be in touch with her regarding the test results.      Pricilla Riffle, MD, [facility]  Electronically Signed    PVR/MedQ  DD: 12/20/2008  DT: 12/20/2008  Job #: [id number]

## 2011-02-27 NOTE — Assessment & Plan Note (Signed)
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  She  continues to have good healing of this chronic wound on her lateral  aspect of her transmetatarsal amputation.  She has an eschar that is  sloughing and she has continued healing of this area with contraction.  Her fem-anterior pulse is patent with [name] good graft pulse.   PHYSICAL EXAMINATION:  Her blood pressure today 109/60, pulse 80 and  respirations 16.  HEENT:  Unremarkable.  Neurologic:  She has no focal  weakness or paresthesias.  Skin:  Without ulcers or rashes other than  the open ulcer on her foot.  I am quite pleased with her result.   I did suggest that she [employer] to discuss [name] possible shoe placement.  She  has not worn her Darco shoe and I feel that it is okay for her to do  this intermittently since she has near-total healing.  I plan to see her  again in 2 months for continued followup and she will also follow up in  our noninvasive vascular lab.     Larina Earthly, M.D.  Electronically Signed   TFE/MEDQ  D:  02/24/2010  T:  02/24/2010  Job:  [id number]

## 2011-02-27 NOTE — Assessment & Plan Note (Signed)
OFFICE VISIT   TAILA, BASINSKI A  DOB:  04/09/1946                                       05/18/2009  WUJWJ#:[id number]   The patient presents today with concern regarding her left foot.  She  has a great deal of sloughing thickened skin. Her main concern is the  region of her arch where there is some skin that is quite thickened.  This was debrided in the office.  I do not see any evidence of a deep  foot infection.  She does not have any tenderness in her arch.  I  debrided the open  fourth and fifth toe amputations.  She has a good  granulation portion in the dorsal half of the wound but continued  necrotic debris in the plantar aspect of this.  This was debrided.  We  continue to instruct her on local care of this.  She is wearing her  Darco shoe.  She has had a couple episodes of oozing from this and  whether this is stopped with simply dressing changes.  We will see her  again in 1 week   Larina Earthly, M.D.  Electronically Signed   TFE/MEDQ  D:  05/18/2009  T:  05/18/2009  Job:  [id number]

## 2011-02-27 NOTE — Assessment & Plan Note (Signed)
OFFICE VISIT   [name], [name]  DOB:  1946-07-10                                       05/11/2009  HYQMV#:[id number]   The patient presents today for followup of her left fem anterior tibial  bypass and open fourth and fifth toe amputation.  She presented to the  emergency room last night with bleeding from the open toe amputation  site.  This was treated with packing in the emergency department.   PHYSICAL EXAM:  Looks quite good.  Her surgical incisions are all  healing quite nicely.  She does have a palpable fem anterior tibial  graft pulse and a palpable anterior tibial pulse at the ankle.  I  removed her dressing.  There is no bleeding.  She does have good  granulating base to her amputation site.  She does have a shrinking area  of necrotic debris at the very base of this and this was debrided in the  office as well.   She was instructed on continued local wound care.  I feel this is  perfectly safe to be taken care of by her family.  She will continue her  walking with Darco shoe and I did instruct her if she had oozing from  this which would not be unlikely for pressure with an Ace wrap.  I will  see her in 3 weeks for continued followup.   Larina Earthly, M.D.  Electronically Signed   TFE/MEDQ  D:  05/11/2009  T:  05/12/2009  Job:  [id number]

## 2011-02-27 NOTE — Assessment & Plan Note (Signed)
OFFICE VISIT   [name], [name]  DOB:  29-Jul-1946                                       04/13/2009  [id number]#:[id number]   The patient presents today for continued discussion regarding her severe  lower extremity arterial sufficiency in her left foot and gangrenous  changes of her left fifth toe.  I had seen her 1 week ago regarding this  and she had undergone outpatient arteriography 2 days ago and is here  for further discussion.  She had undergone aortogram with runoff by Dr.  Cari Caraway.  She has chronic urinary tract infections and has had  fevers and feels that this is [name] recurrent urinary tract infection and  has been started on antibiotics by her urologist.  She does have no real  change in her foot.  She does have some mild erythema but no evidence of  fluctuance.  I reviewed her arteriogram with her and explained that this  is what I have expected and discussed on her initial evaluation  regarding her physical exam.  She does have no evidence of aortoiliac  occlusive disease.  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  I explained that her only option for  improved flow would be bypass and had recommended [name] left femoral to  anterior tibial bypass with vein and also fifth toe amputation and  possible fourth toe amputation depending involvement.  I had recommended  that we proceed as soon as possible and actually had offered surgery  tomorrow on 07/01.  She reports she is not willing to proceed this  quickly and needs time to get her affairs in order.  I explained her  next option would that the 5th is an OR holiday and that her next time  available for surgery would be July 6th and she is willing to proceed  with surgery at that time.  She will be admitted for same day surgery  for [name] left femoral vein to anterior tibial bypass and toe amputations.  I also explained that if she has inadequate vein  another option would be  bypass into her popliteal, which would improve flow to her foot but  would not be in-line flow, would not be as desirable.  We will proceed  with surgery on April 19, 2009.  She knows to notify us should she develop  any increasing pain, drainage, or erythema in her foot, at which time  she may require urgent bypass.   Larina Earthly, M.D.  Electronically Signed   TFE/MEDQ  D:  04/13/2009  T:  04/14/2009  Job:  [id number]

## 2011-02-27 NOTE — Cardiovascular Report (Signed)
[name], [name]           ACCOUNT NO.:  [id number]   MEDICAL RECORD NO.:  [id number]          PATIENT TYPE:  OIB   LOCATION:  1962                         FACILITY:  [facility]   PHYSICIAN:  Arturo Morton. Riley Kill, MD, FACCDATE OF BIRTH:  [birth year]   DATE OF PROCEDURE:  01/13/2009  DATE OF DISCHARGE:  01/13/2009                            CARDIAC CATHETERIZATION   INDICATIONS:  [name] is a 65 year old, who underwent  revascularization surgery in 1992.  She has insulin-dependent diabetes  mellitus.  She recently had an episode of what was described as a  seizure-like event.  The etiology of this was unclear.  She has mild  aortic stenosis by on Dr. Charlott Rakes evaluation.  She has a radionuclide  imaging study with dobutamine on the date of December 21, 2008.  There is  moderate peri-infarct ischemia in the inferior wall.  It was not thought  to be changed from 2008, but there was some suggestion of progression.  She was therefore set up for cardiac catheterization.  Last chest x-ray  showed no acute changes.  The current study was done to assess coronary  anatomy.  The patient has mild chronic kidney disease.  She was brought  into the laboratory early and given hydration for several hours.  Importantly, she stopped her diuretics 2 or 3 days ago, and also was  drinking fluids.  Current study was done after good hydration.  Creatinine was 1.3.   PROCEDURES:  1. Left heart catheterization.  2. Selective coronary arteriography.  3. Saphenous vein graft angiography.  4. Selective left internal mammary angiography.   DESCRIPTION OF PROCEDURE:  The patient was seen on arrival in the  catheterization laboratory earlier.  Fluids were continued.  We  discussed the risks, benefits, and alternatives with her carefully and  she was subsequently brought to the laboratory for further evaluation.  Through an anterior puncture using a SmartNeedle, the femoral artery was  entered.  It was somewhat  stiff.  We were able to use a 4-French sheath.  Following this, views of the left and right coronary arteries were  obtained.  An 0.035 was used to cannulate the left coronary.  The right  coronary was engaged with a 3DRC.  Vein graft on the left were engaged  with left bypass.  We used a right bypass for the anterior wall graft.  The 3DRC was used to engage the internal mammary, which did a good job.  The aortic valve was crossed using a J-wire, and the right bypass  catheter without difficulty.  Transvalvular pressures were measured, but  no right heart catheterization was done as per order at the time of  arrival.  She tolerated the procedure well.   HEMODYNAMIC DATA:  1. The central aortic pressure was 148/52, mean 85.  2. LV pressure 168/23.  3. Peak-to-peak gradient was 20 mm.   ANGIOGRAPHIC DATA:  1. On plain fluoroscopy, there is moderate calcification of the      coronaries.  2. The left anterior descending artery is a severely diseased vessel,      it has typical diabetic appearance with multiple pruning, and  it is      very small in caliber.  It basically was not filled distally.  3. The internal mammary to the distal LAD is patent.  However, the      distal LAD is occluded after the graft insertion and retrograde is      occluded as well.  There are 3-4 septal perforators, which come off      this vessel and resolved in a patent vessel.  4. The circumflex is a dominant vessel.  There is a tiny first      marginal that bifurcates, it prunes distally.  There is then about      70-80% focal calcified stenosis in the dominant circumflex.  Just      after this, there is likely an occluded vessel that is known to be      bypassed distally, there is a small tiny marginal branch and then a      severely diseased marginal branch that is tiny in caliber much      smaller than the 4-French catheter.  Distally, there are 2 branches      that come off and then there is an occlusion of  the distal inferior      circumflex, which fills distally by collaterals and itself is      pruned as well.  5. The saphenous vein graft to the large marginal branch is intact      with about 40%  insertion narrowing.  The native vessel has a      typical diabetic appearance, but is not critically diseased.   The saphenous vein graft presumably to the distal circumflex is  occluded.  The operative report cannot be found in medical records and  is unavailable.  There are, however, 2 proximal rings, and an internal  mammary with evidence of staples.   CONCLUSIONS:  1. Continued patency of the left anterior descending to the mid left      anterior descending with 4 septal perforators arising.  2. Continued patency of a saphenous vein graft to a large marginal      system.  3. Occluded saphenous vein graft presumably to the distal dominant      circumflex system.  4. Moderately high-grade stenosis of the atrioventricular circumflex,      but with distal stenoses involving a severely diseased marginal      branch that is not amenable to percutaneous intervention and an      inferior branch, which also is not amendable.   DISPOSITION:  I will review the findings with [name], more than likely  she will need medical therapy.  The proximal circumflex could be  stented, but the distal marginal, and the probable PDA arising from the  circumflex will not necessarily be substantially improved with this.      Arturo Morton. Riley Kill, MD, [facility]  Electronically Signed     TDS/MEDQ  D:  01/13/2009  T:  01/14/2009  Job:  [id number]   cc:   Pricilla Riffle, MD, [facility]  CV Laboratory  Sheliah Plane, MD

## 2011-02-27 NOTE — H&P (Signed)
[name], [name]           ACCOUNT NO.:  [id number]   MEDICAL RECORD NO.:  [id number]          PATIENT TYPE:  REC   LOCATION:  FOOT                         FACILITY:  [facility]   PHYSICIAN:  Joanne Gavel, M.D.        DATE OF BIRTH:  [birth year]   DATE OF ADMISSION:  04/05/2009  DATE OF DISCHARGE:                              HISTORY & PHYSICAL   NEW VISIT:   CHIEF COMPLAINT:  Wound, left small toe.   HISTORY OF PRESENT ILLNESS:  This is a juvenile diabetic patient who  after taking a shower and clearing a callus from the left small toe  noticed some skin loss and discoloration.  Over the past 2 weeks, this  has proceeded now to a completely black toe.  There has been some odor.  She denies any systemic symptoms, no fevers, chills, sweating spells.  No malaise, nausea or difficulty with the usual activities.  She has  been dressing this by herself.   PAST MEDICAL HISTORY:  1. Peripheral vascular disease.  2. She describes possible intermittent claudication and was put on a      walking program.  3. History of coronary artery disease and is status post CABG.  4. She is a juvenile diabetic for 59 years and has a history of      neuropathy.  5. She has had breast cancer treated by mastectomy in 1984.   OTHER SIGNIFICANT MEDICAL CONDITIONS:  1. There has been a diagnosis of hypothyroidism.  2. Scleroderma.  3. Cat bite leading to hand surgery.  4. Carpal tunnel syndrome.  5. Question of seizure in February.   MEDICATIONS:  1. Lantus.  2. Humalog.  3. Levothyroxine.  4. Lipitor.  5. Zetia.  6. Accupril.  7. Lasix.  8. Metoprolol.  9. Aspirin.   ADVERSE REACTIONS:  1. CIPRO.  2. KEFLEX.  3. ADHESIVE TAPE.   PHYSICAL EXAMINATION:  VITAL SIGNS:  Temperature 98.5, pulse 66,  respirations 18, blood pressure 135/70.  GENERAL:  The patient is awake and alert.  Is ambulatory.  HEENT:  Normal to cursory examination.  CHEST:  Clear without thrills, murmurs or gallops.  HEART:  Regular rhythm.  There is a CABG wound.  ABDOMEN:  Somewhat obese without palpable mass or organomegaly.  PELVIRECTAL:  The patient was not undressed for pelvirectal.  EXTREMITIES:  I feel no foot or popliteal pulses, and ABI on the left  side was 89.  The general skin nutrition is good.  The left fifth toes  is black.  It is tender.  There is a slight odor and some signs of wet  gangrene proximal to the area of blackening.   IMPRESSION:  Dry gangrene with impending wet gangrene.   PLAN:  I spoke to Dr. Cari Caraway of the vascular department.  I  believe that she will certainly require toe amputation and possibly ray  amputation, and may require vascular studies.  I have not ordered  vascular studies or laboratory work, as she will be seen within the next  day or two by the vascular surgeons.      Channing Mutters  Wiliam Ke, M.D.  Electronically Signed     RA/MEDQ  D:  04/05/2009  T:  04/05/2009  Job:  [id number]

## 2011-02-27 NOTE — Assessment & Plan Note (Signed)
OFFICE VISIT   [name], [name]  DOB:  07/01/1946                                       12/02/2009  YNWGN#:[id number]   The patient presents today for continued followup of her partial foot  amputation and left fem anterior tibial bypass.  She looks quite good  today and the remaining portions of her mattress sutures were removed  and her foot is healing quite nicely.  She does have [name] slight continued  open area on the lateral aspect of her foot where she had an open  transmetatarsal amputation.  This is closing in very quickly.  She will  discontinue her PICC line and Primaxin.  She did have Pseudomonas in the  wound at the time of her surgery.  I am quite pleased as is the patient  and plan to see her again in 1 month for continued followup and for  noninvasive vascular laboratory protocol followup.     Larina Earthly, M.D.  Electronically Signed   TFE/MEDQ  D:  12/02/2009  T:  12/02/2009  Job:  [id number]   cc:   Pricilla Riffle, MD, [facility]

## 2011-02-27 NOTE — Op Note (Signed)
[name], [name]           ACCOUNT NO.:  [id number]   MEDICAL RECORD NO.:  [id number]          PATIENT TYPE:  INP   LOCATION:  5036                         FACILITY:  [facility]   PHYSICIAN:  Madelynn Done, MD  DATE OF BIRTH:  26-Feb-1946   DATE OF PROCEDURE:  06/12/2008  DATE OF DISCHARGE:                               OPERATIVE REPORT   PREOPERATIVE DIAGNOSIS:  Right hand infection, deep abscess hypothenar  region right hand.   POSTOPERATIVE DIAGNOSIS:  Right hand infection, deep abscess hypothenar  region right hand.[name]   PROCEDURE:  Right hand incision and drainage of deep hand abscess  hypothenar region.   ATTENDING SURGEON:  Dr. Bradly Bienenstock who scrubbed and present for the  entire procedure.   ASSISTANT SURGEON:  None.   ANESTHESIA:  General.   INTRAOPERATIVE FINDINGS:  The patient did have gross purulence in the  hypothenar region of the right hand.  Intraoperative cultures were  taken.   SURGICAL INDICATIONS:  [name] is a 65 year old right-hand-dominant  female who sustained a cat bite approximately 2 weeks ago.  The patient  did not respond to oral antibiotics and continued to have a painful  swollen right hand.  The patient presented to the office yesterday and  was noted to have a fluctuant area is abscess collection over the right  hand.  He was recommended that she undergo the above procedure.  Risks,  benefits, and alternatives were discussed in detail with the patient and  signed informed consent was obtained.   DESCRIPTION OF PROCEDURE:  The patient was properly identified in the  preoperative holding area, mark with permanent marker was made on the  right hand to indicate correct operative site. The patient was then  brought back to the operating room and placed supine on the anesthesia  room table where general anesthetic was administered.  The patient  tolerated this well.  A well-padded tourniquet was then placed on the  right brachium and  sealed with a 1000 drape.  The right upper extremity  was then prepped and draped in normal sterile fashion.  The time out was  called.  The correct site was identified and the procedure was then  begun.  The limb was then elevated and tourniquet insufflated to 150  mmHg.  A longitudinal incision was then made directly over the  hypothenar musculature.  Gross purulence was encountered.  Blunt  dissection was then carried out throughout the remainder of the case to  expose the abscess cavity.  Extended into the hypothenar musculature  where the fascia was incised and the deep abscess was then drained.  Following incision and drainage, the wound was then thoroughly irrigated  with 3 liters of lactated Ringer's solution.  Following the irrigation,  debridement of the skin and subcutaneous tissues were then carried  around the abscess region.  A small Penrose drain was then placed into  the area.  The tourniquet was deflated less than 6 minutes.  Three 4-0  nylon sutures were then placed and tacked back to skin and closed it  over the Penrose drain.  The patient's intraoperative  cultures were then  taken both in aerobic, anaerobic cultures.  Adaptic dressing was then  applied, a sterile compressive dressing was then applied, and the  patient was placed in a well-padded volar resting splint.  She was  extubated and taken to recovery room in good condition.   POSTOPERATIVE PLAN:  The patient will be admitted for IV antibiotics and  wound care.  We will continue to follow her wound cultures and then once  we can get her p.o. regimen, we will likely discharge.  If her symptoms  worsen, the patient may require further incision and drainage.      Madelynn Done, MD  Electronically Signed     FWO/MEDQ  D:  06/12/2008  T:  06/13/2008  Job:  [id number]

## 2011-02-27 NOTE — Assessment & Plan Note (Signed)
OFFICE VISIT   [name], [name]  DOB:  04/16/1946                                       09/16/2009  MWUXL#:[id number]   Patient presents today for continued followup of her open foot wound.  She looks quite good today and is in excellent spirits.   She does have [name] palpable left  anterior tibial graft pulse.  She has  excellent contraction of her wound from her open third, fourth, and  fifth toes with an excellent granulating base.   I am quite pleased with her continued progress, as is the patient.  She  will drop back to daily hydrogel dressing changes, [name] d I will see her  again in 1 month.   Larina Earthly, M.D.  Electronically Signed   TFE/MEDQ  D:  09/16/2009  T:  09/17/2009  Job:  [id number]

## 2011-02-27 NOTE — Assessment & Plan Note (Signed)
[facility]                            CARDIOLOGY OFFICE NOTE   NAME:[name], [name] ROLFSON                  MRN:          [id number]  DATE:12/26/2007                            DOB:          06-06-1946    IDENTIFICATION:  The patient is a 65 year old woman.  She was previously  followed by [name].  I saw her back in September of last year.  She  has a history of diabetes (brittle to her report, coronary artery  disease).  The last cardiac catheterization in 1992 (LAD was 70% prox,  80% prox, 90% distal; circumflex was 80%, OM1 was 80%, OM2 90%,  RCA  (non-dominant with 80% to 90% lesion).  (The patient is status post CABG  x4 at that time).  Last Myoview scan was in July of this past year that  showed small scar at the inferior apex and moderate inferior ischemia  that was unchanged from previous scans.   Since seeing the patient, says she just has not been feeling right.  She  is being seen by urology later this afternoon.  She thinks she may have  a urinary infection.  She denies any significant chest pain, just  overall does not feel well, has a lot going on, she says.  Is concerned  about her diabetes, does not have anyone following, and thus she was  referred by Dr. Talmage [name] (by Dr. Severe).  She has low grade fevers, 99.  Breathing is okay.  Has problems with rash on the soles of her feet,  left greater than right.   CURRENT MEDICATIONS:  Include Lipitor 40, Zetia 10, Synthroid 75,  Macrobid 100, aspirin 325, Accupril 10.   ALLERGIES:  INCLUDE KEFLEX AND CIPROFLOXACIN.   PAST MEDICAL HISTORY:  1. CAD.  2. Diabetes.  3. History of breast cancer status post left mastectomy.  4. Dyslipidemia.  5. Retinopathy.  6. Peripheral neuropathy.  7. Hypothyroidism.  8. Aortic stenosis (mild by echo).   PHYSICAL EXAM:  GENERAL:  On exam, the patient is in no distress at  present at rest.  VITAL SIGNS:  Blood pressure is 163/74, on my check 150/80,  pulse is 68,  weight 179.  LUNGS:  Her lungs are clear.  NECK:  JVP is normal without audible bruits.  CARDIAC:  Exam regular rate and rhythm, S1-S2.  Grade 2/6 systolic  murmur heard best at the base.  Grade 1-2/6 systolic murmur heard best  at the base.  ABDOMEN:  Benign.  EXTREMITIES:  No edema, erythema with some cracking of the lower soles  of her feet, question fungal.   IMPRESSION:  1. Coronary artery disease.  I am not convinced there is any signs of      unstable symptoms.  Her Myoview really has had no change.  She does      have some inferior ischemia, but again been maintained for years on      medical therapy.  I do not think there is anything new.  I would      continue for now.  Note, she is off of Toprol, and I will  re-add      this, as she is just been out at a couple months.  Her blood      pressure will hopefully improve.  I will need to follow this up.  2. Dyslipidemia.  Fasting lipids in September were okay, could be      better.  Her LDL was 71, HDL of 37, triglycerides 117.  Note a      glucose at the time was 235.  I would keep on same regimen for now.  3. Health care maintenance.  The patient is scheduled for a urology      visit today.  I would check CBC, platelets, BMET, TSH and AST.      Given her symptoms of not feeling well, make sure there is no      elevation.   I have spoken with Dr. Talmage [name].  I think it is important that she gets  followup for her diabetes.  She has not been seen for over a year.   I will set follow-up for the summer, sooner if problems develop.     [name] Riffle, MD, [facility]  Electronically Signed    PVR/MedQ  DD: 12/26/2007  DT: 12/28/2007  Job #: [id number]   cc:   [name], M.D.

## 2011-02-27 NOTE — Assessment & Plan Note (Signed)
[facility]                            CARDIOLOGY OFFICE NOTE   NAME:[name], [name] KLINGER                  MRN:          [id number]  DATE:03/26/2007                            DOB:          [birth year]    ADDENDUM:  The EKG reveals normal sinus rhythm, old anterior MI,  nonspecific ST/T changes, slightly more prominent since the previous  study of a year ago.     Cecil Cranker, MD, [facility]  Electronically Signed    EJL/MedQ  DD: 03/26/2007  DT: 03/26/2007  Job #: [id number]

## 2011-02-27 NOTE — Assessment & Plan Note (Signed)
OFFICE VISIT   KAROLINE, FLEER A  DOB:  1946/02/11                                       10/10/2009  YNWGN#:[id number]   Patient presents for continued follow-up of the tip of her left foot.  She continues to have palpable lateral fem-anterior tibial graft pulse.  She has excellent healing of the open transmetatarsal amputation of her  third, fourth, and fifth toe.  Unfortunately now, she has developed a  blister on the medial aspect of her foot at the medial aspect of her  great toe metatarsal head.  She had seen Dr. Hart [name] last week and had  been started on the Augmentin.  She apparently had a great deal of  erythema at that time.  The erythema has mostly resolved.  The blister  may be a contact blister, although I cannot rule out a deeper infection.   I plan to continue to see her as an outpatient.  I had a long discussion  with the patient and explained this could require further amputation if  she indeed has an infection in her great toe metatarsal head.  I will  see her again as scheduled on 10/21/09.     Larina Earthly, M.D.  Electronically Signed   TFE/MEDQ  D:  10/10/2009  T:  10/11/2009  Job:  [id number]

## 2011-02-27 NOTE — Assessment & Plan Note (Signed)
OFFICE VISIT   [name], [name]  DOB:  03-17-1946                                       06/03/2009  ZOXWR#:[id number]   Patient presents today for continued followup of her left foot and  bypass.  She has an excellent anterior tibial graft pulse.  She has  continued healing of her fourth and fifth open toe amputation site.  She  does have an area over the extreme lateral aspect that does have some  necrotic debris in the base, and she is seen to be tender over this  area.  She also has [name] rather large flap of skin in the plantar aspect of  her wound.  The upper 2/3 to 3/4 of the wound on the dorsal portion of  the open wound is healing quite nicely.  She does have some exudate at  the third metatarsal head.  I tried to debride this in the office, but  it was quite tender to the patient.  I have recommended that she undergo  an outpatient debridement in the operating room.  I explained that if  she has obvious involvement of the third metatarsal head, that she would  require [name] third toe amputation as well.  She understands and will  proceed with this on 08/26.   Larina Earthly, M.D.  Electronically Signed   TFE/MEDQ  D:  06/03/2009  T:  06/03/2009  Job:  [id number]

## 2011-02-27 NOTE — Assessment & Plan Note (Signed)
OFFICE VISIT   [name], [name]  DOB:  02-21-1946                                       10/04/2009  EAVWU#:[id number]   The patient returns today for further examination of her left foot wound  where the fourth and fifth toes were amputated laterally.  This last  procedure was done on 09/16 by Dr. Myra Gianotti.  She was seen [name] few weeks  ago by [name] and the wounds seemed to be slowly improving.  She has  had some fever over the last 3 or 4 days although none yesterday.  She  has also had some pulmonary congestion and has not been on antibiotics.  She thinks that the left foot is slightly more red than it is but states  that it is chronically red and that she states that she is having some  swelling due to being on her feet [name] for the holidays.  She has had  no chest pain, dyspnea on exertion, PND, orthopnea or pulmonary issues.   PHYSICAL EXAM:  Blood pressure 106/58, heart rate 75, temperature 98.5.  Generally she is [name] well-developed, well-nourished female who is in no  apparent distress.  Lower extremity exam reveals 3+ femoral and lateral  graft pulse in the left leg with some erythema in the distal half of the  left foot but no purulent drainage.  The lateral wound was examined and  there is granulation tissue with some exudate distally but was unable to  express any purulence.  The right foot is unremarkable.   I do not know if the foot is the etiology of the low-grade fever she has  had the past few days but we will start her on amoxicillin 500 mg t.i.d.  for 2 weeks.  She will return next Monday to see [name] for further  examination.  If she starts having high fever or increasing redness in  the foot she will be in touch with [place] for further evaluation.     Quita Skye Hart [name], M.D.  Electronically Signed   JDL/MEDQ  D:  10/04/2009  T:  10/05/2009  Job:  [id number]

## 2011-02-27 NOTE — Discharge Summary (Signed)
[name], [name]           ACCOUNT NO.:  [id number]   MEDICAL RECORD NO.:  [id number]          PATIENT TYPE:  INP   LOCATION:  5009                         FACILITY:  [facility]   PHYSICIAN:  Madelynn Done, MD  DATE OF BIRTH:  Dec 31, 1945   DATE OF ADMISSION:  06/23/2008  DATE OF DISCHARGE:  06/29/2008                               DISCHARGE SUMMARY   ADMITTING AND DISCHARGING PHYSICIAN:  Madelynn Done, MD   CONDITION AT DISCHARGE:  Good.   ADMISSION DIAGNOSES:  1. Right hand methicillin-resistant staphylococcus aureus infection.  2. Cat bite, right hand.  3. Diabetes.   DISCHARGE DIAGNOSES:  1. Right hand methicillin-resistant staphylococcus aureus infection.  2. Cat bite, right hand.  3. Diabetes.   DISCHARGE MEDICATIONS:  1. Zyvox 600 mg p.o. twice a day for 2 weeks.  2. Resume home medications and doses.   PROCEDURES AND DATES:  1. Incision and drainage of right hand on June 23, 2008.  2. Incision and drainage of right hand on June 25, 2008.   REASON FOR ADMISSION:  [name] is a 65 year old female who  sustained a cat bite and underwent incision and drainage of her right  hand on June 12, 2008.  She did not improve clinically and it was  recommended that she undergo a repeat I&D for her hand infection.  She  was admitted to undergo the above procedure.   HOSPITAL COURSE:  The patient tolerated both procedures under general  anesthesia.  She was continued on IV vancomycin.  She was followed  clinically and continued to show signs and symptoms of improvement.  She  was seen and examined on postoperative day #6 and 4 and her hand looked  continue to make improvements.  She was felt ready to be discharged to  home on oral antibiotics.  On the day of discharge, the patient was  afebrile.  Her vital signs were stable and normal.  She was tolerating  her diet.  Her blood sugars were controlled and felt ready to be  discharged to home.   RECOMMENDATIONS AND DISPOSITION:  The patient will be discharged to  home.  Continue on the above discharge medications.  She is going to be  seen in my office today for local wound care and whirlpool and follow on  a daily basis for the next week to see if she shows continued signs of  improvement.  Prior to discharge, the patient's discharge instructions  were explained to her.  Questions were answered.  The patient voiced  understanding of plan.      Madelynn Done, MD  Electronically Signed     FWO/MEDQ  D:  06/29/2008  T:  06/30/2008  Job:  [id number]

## 2011-02-27 NOTE — Assessment & Plan Note (Signed)
OFFICE VISIT   [name], [name]  DOB:  12-23-1945                                       11/04/2009  ZOXWR#:[id number]   The patient is [name] patient of Dr. Tawanna Cooler Early.  She is being seen today for  followup following her amputation of her left first and second toes on  10/25/2009.  She had cultures sent intraoperatively which showed no  anaerobes but few Pseudomonas aeruginosa.  She had grown this from her  previous culture in September of 2010 as well as Bacteroides caccae  which was betalactamase positive.  These were taken during [name]  hospitalization for infected left third toe which was amputated.  She  also had resection of the second and fifth metatarsal head at that time.  She has been seen in the past by infectious disease, most recently by  Dr. Ninetta Lights and in her September admission was actually discharged with  IV Zosyn.  Due to the recurrent Pseudomonas [name] did speak with  infectious disease specialist last week and changed her Augmentin to IV  antibiotics.  She reports that the initial antibiotic called in was in  the same family as one of her antibiotic allergies so therefore she has  been changed to Primaxin through [name] right arm PICC line for the next 2  weeks.  From [name] surgical standpoint she feels she has been doing  relatively well.  She has not been having to take any pain medication.  She does have to take Neurontin on an as-needed basis for intermittent  sharp tingling pains.  She continues to clean her foot with saline and  wrap with [name] dry gauze.  She has [name] home Geneticist, molecular.  There has been  some mild erythema below the suture line but she says this has been  present since her discharge.  She also had fever the night she was  discharged on January 12 but has had none since.   Today she reported that she began experiencing intermittent chest  pressure since Wednesday of this week, which was 2 days ago.  She said  she had her PICC line  placed on Tuesday.  She denies chest pain but says  she gets [name] pressure just above her sternal notch.  It does not radiate.  She has no tingling or jaw pain.  She says to the best of her knowledge  she feels that it is associated with exertion and she has been feeling  more short of breath since her discharge but says this in itself is not  too abnormal for her.  She says she has been getting these pains five to  six times per day with the last episode approximately 3 hours ago at 12  noon.  This began when she was hurrying to get ready for her appointment  and said it stopped almost immediately upon resting.  She has not had to  take anything for this and no longer has nitroglycerin available.  She  has had antigen in the past and says this does not feel like the angina  she had before which was involving her entire chest and was [name] squeezing  nature.  She does have [name] history of coronary artery disease and coronary  artery bypass grafting and was to see her cardiologist, Dr. Dietrich Pates,  the week of her surgery  but was unable to make the appointment due to  her surgery.  She says the pain is not worse with lying down or after  meals.  She has not had any calf pain or out of the usual leg or arm  swelling.   PHYSICAL EXAMINATION:  Vitals today show [name] blood pressure of 158/76,  heart rate of 87, respirations 20.  Her lung sounds are clear.  Her  heart has [name] regular rate and rhythm.  She does have [name] 2/6 systolic  ejection murmur auscultated on the right sternal border.  Her arm shows  evidence of [name] right PICC line.  There is no swelling.  Her hands are  well-perfused.  Her abdomen is soft.  Her extremities show mild lower  extremity edema which is symmetrical.  There is no calf pain.  Actually  both feet have posterior tibial, anterior tibial and peroneal Doppler  signals.  Her left foot suture line is intact over the first metatarsal  region.  There is dry callus like formation.  There is  mild brownish  yellow drainage over the mid incision.  There is no odor.  The wound on  her lateral foot has good granulation tissue.  The plantar surface of  her foot is still quite tender, however, this is not new.  There is mild  erythema just below the suture line.   From [name] surgical standpoint I feel her transmetatarsal amputation site is  stable.  She is concerned about the culture of Pseudomonas.  I assured  her we will continue the Primaxin as ordered and we will have her seen  by [name] before her PICC line is removed.  She can continue her  local wound care.  She has an appointment see [name] in 2 weeks.  In  regards to her chest pressure I felt this should be further evaluated  due to her coronary history.  Although the location is not typical for  cardiac chest pain I explained to her that sometimes symptoms are  atypical particularly in women and in diabetics.  She is not having  chest pain currently and she is not having any symptoms at rest.  I  discussed these findings with on call vascular surgeon, Dr. Waverly Ferrari, and I spoke with Dr. Valera Castle at [facility].  Dr.  Daleen Squibb felt that since she was not having these symptoms at rest that it  would be sufficient to send her home with [name] prescription for  nitroglycerin.  She may take one every 5 minutes up to 3 tablets for  chest pain or pressure.  However, if she develops symptoms at rest she  should also called 911.  She should also call 911 if these symptoms were  not relieved with three nitroglycerin.  Dr. Daleen Squibb said they would contact  her regarding an outpatient followup visit with [name] next week.  The  patient was given [name] prescription for nitroglycerin with the directions  as discussed above with #25 tablets.   Jerold Coombe, PA   Larina Earthly, M.D.  Electronically Signed   AWZ/MEDQ  D:  11/04/2009  T:  11/05/2009  Job:  [id number]   cc:   Pricilla Riffle, MD, [facility]

## 2011-03-02 ENCOUNTER — Telehealth: Payer: Self-pay | Admitting: Internal Medicine

## 2011-03-02 DIAGNOSIS — I350 Nonrheumatic aortic (valve) stenosis: Secondary | ICD-10-CM

## 2011-03-02 MED ORDER — FUROSEMIDE 40 MG PO TABS: ORAL_TABLET | ORAL | Status: DC

## 2011-03-02 NOTE — Assessment & Plan Note (Signed)
[facility]   NAME:  [name], [name]           ACCOUNT NO.:  [id number]   MEDICAL RECORD NO.:  [id number]      DATE OF BIRTH:  [birth year]   PHYSICIAN:  Jonelle Sports. Sevier, M.D.  VISIT DATE:  04/02/2006                                     OFFICE VISIT   This 64 year old white female is followed for a traumatic wound of the  anterior left calf at the tibial plateau level.  This was sustained when she  fell against a painted metal stool of some soft.  It failed to heal and has  converted into a chronic wound and when last seen had considerable eschar  formation.  It was crosshatched at that time and she was placed on a single  application of Panafil and the extremity placed in a compressive wrap.   She reports no pain, no breakthrough drainage, no odor, no fever or systemic  symptoms.  She has had no change in medications.   EXAMINATION:  Blood pressure 134/50, heart rate 80, respirations 18,  temperature 97.9.   The wound itself is now covered by soft eschar, which is somewhat harder in  the more proximal area of the wound.  The surrounding erythema is  considerably less than on her previous visit.  The swelling in the extremity  itself is minimal.   IMPRESSION:  1.  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  2.  Diabetes mellitus type 1, chronic poor control.  3.  Coronary artery disease and peripheral artery disease.   DISPOSITION:  This wound is full-thickness debrided under protection of 5%  topical lidocaine.  Several tiny small bodies, perhaps paint fragments, are  found and removed.  At the 1 to 2 o'clock position of the wound is a  penetrating area that appears to measure approximately 5-10 cm in depth.  There is no odor.  There remains despite debridement some fibrinous tissue  that needs to come out.   Accordingly, the wound is dressed again with Panafil gel, covered with an  absorptive pad and held in place  with a soft Kerlix wrap and stockinette  overlay.   The patient is instructed to change the dressing at home every 2 days,  washing the wound well with warm soapy water, rinsing it well, patting it  dry, and then reapplying Panafil and a dry dressing.   Follow-up visit will be here in 8 days.           ______________________________  Jonelle Sports Cheryll Cockayne, M.D.     RES/MEDQ  D:  04/02/2006  T:  04/02/2006  Job:  [id number]

## 2011-03-02 NOTE — Telephone Encounter (Signed)
Called patient with echo results. Will send in script for Lasix 40mg .

## 2011-03-02 NOTE — Assessment & Plan Note (Signed)
[facility]   NAME:  [name], [name]           ACCOUNT NO.:  [id number]   MEDICAL RECORD NO.:  [id number]      DATE OF BIRTH:  Dec 13, 1945   PHYSICIAN:  Jonelle Sports. Sevier, M.D.  VISIT DATE:  04/23/2006                                     OFFICE VISIT   VITAL SIGNS:   PURPOSE OF TODAY'S VISIT:  This 65 year old white female with longstanding  type 1 diabetes and multiple complications thereof has been followed for a  traumatic wound in the high right pretibial area.  This originally had a  deep component, but that has filled in nicely, and most recently the patient  has been treated simply with an application of Panafil and dressing change  every 2 days.   She reports no odor, no pain, no increased drainage and no fever or other  systemic symptoms.  She has had no change in her medications.   WOUND EXAM:  Blood pressure 160/110, about which the patient is cautioned,  pulse 60, respirations 16, temperature 97.7, blood glucose this morning was  said to be 569 and again she was cautioned about this and will take steps,  she says, to correct it.   The wound itself now measures 1.8 x 0.9 x 0.4 cm and shows about 25% primary  epithelialization advancing from the distal margin of the wound in a U-  shaped fashion.  Just proximal to this there are some areas of heaped  granulation but now there is minimal slough remaining in the wound.  There  is some crusting around the wound.   IMPRESSION:  Satisfactory progress deep traumatic wound right lower  extremity.   DISPOSITION:  1.  The wound is debrided of the crust from the margin, some of the small      amount of slough from the base and also the area of hypergranulation      sharply debrided and then treated with an application of silver nitrate.  2.  The wound is treated with an application of Neosporin and covered with      dry gauze dressing.   The patient is instructed to change the dressing daily, cleansing  the wound  with warm soapy water, rinsing it well, reapplying neosporin and a dry  bandage.   She is asked to return here in 3 weeks for what should be a final visit.   WOUND SINCE LAST VISIT:   CHANGE IN INTERVAL MEDICAL HISTORY:   DIAGNOSIS:   TREATMENT:   ANESTHETIC USED:   TISSUE DEBRIDED:   LEVEL:   CHANGE IN MEDS:   COMPRESSION BANDAGE:   OTHER:   MANAGEMENT PLAN & GOAL:           ______________________________  Jonelle Sports. Cheryll Cockayne, M.D.     RES/MEDQ  D:  04/23/2006  T:  04/23/2006  Job:  [id number]

## 2011-03-02 NOTE — Assessment & Plan Note (Signed)
[facility]   NAME:  [name], [name]           ACCOUNT NO.:  [id number]   MEDICAL RECORD NO.:  [id number]      DATE OF BIRTH:  Mar 28, 1946   PHYSICIAN:  Theresia Majors. Tanda Rockers, M.D. VISIT DATE:  05/15/2006                                     OFFICE VISIT   WOUND CARE CLINIC NOTE   DATE OF VISIT:  May 15, 2006   SUBJECTIVE:  [name] returns for followup of a right lower extremity  anterior open wound associated with stasis.  During the interim she has  denied drainage.  She has denied fever or pain.   OBJECTIVE:  VITAL SIGNS:  Her blood pressure is 128/57, respirations 18,  pulse rate 78 and she is afebrile.  EXTREMITIES:  Inspection of the lower extremities shows there is bilateral  2+ edema.  There are brawny changes in both lower extremities but to a  greater extent on the right.  The ulceration is completely healed.  Her  pedal pulses remain indeterminate.   ASSESSMENT:  Persistent stasis but healed ulceration.   PLAN:  We discharged the patient.  We have encouraged her to continue  wearing the previously-prescribed over-the-counter extra support hose to  help control her peripheral edema.  She is to keep her appointments with her  primary care physician and we will see her on a p.r.n. basis.           ______________________________  Theresia Majors Tanda Rockers, M.D.     [name]  D:  05/15/2006  T:  05/15/2006  Job:  [id number]

## 2011-03-02 NOTE — Telephone Encounter (Signed)
Pt needs rx of furosimide at [facility] [address], was to be called in during office visit but not there

## 2011-03-02 NOTE — Assessment & Plan Note (Signed)
[facility]                              CARDIOLOGY OFFICE NOTE   NAME:STRANEYNakayla, Rorabaugh                    MRN:          [id number]  DATE:06/12/2006                            DOB:          02/02/1946    HISTORY OF PRESENT ILLNESS:  Ms. Soldo is a very pleasant 65 year old  white female with a long history of diabetes, coronary artery disease,  hypertension, hyperlipidemia.  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 Tyrone Sage.  She has mild aortic stenosis.  Recent admission with urinary tract infection  in June.  She had transesophageal __________ transthoracic echo revealed LV  systolic function in the lower limits of normal and aortic gradient of 7.4.  Most recent echo June 12, 2006 reveals normal LV function, aortic valve  gradient of 10, some decreased excursion of the aortic valve.  Carotid  Dopplers revealed 0-39% stenosis bilaterally.   MEDICATIONS:  1. Insulin.  2. Toprol XL 25 daily.  3. Levothyroxine 25.  4. Accupril 10.  5. Lipitor 40.  6. Zetia 10.  7. Macrobid.  8. Aspirin 81.  Recent LDL was 53.   PHYSICAL EXAMINATION:  VITAL SIGNS:  Blood pressure 146/60, pulse 68, normal  sinus rhythm.  GENERAL:  Normal.  NECK:  JVP is not elevated.  Carotid pulses without bruits.  LUNGS:  Clear.  CARDIAC:  1-2/6 short systolic ejection murmur at the aortic area.  EXTREMITIES:  Unremarkable.   DIAGNOSES:  As above.  I have encouraged the patient that keeping her lipids  well-controlled is very important.  Also I have encouraged her to have a  follow-up colonoscopy.  The previous one was done by Dr. Jarold Motto in 1990.  She is to contact Dr. Jarold Motto.  We will see her in 4-5 months or p.r.n.                              E. Graceann Congress, MD, [facility]    EJL/MedQ  DD:  06/12/2006  DT:  06/12/2006  Job #:  [id number]

## 2011-03-02 NOTE — Consult Note (Signed)
[name], [name]           ACCOUNT NO.:  [id number]   MEDICAL RECORD NO.:  [id number]           PATIENT TYPE:   LOCATION:                                 FACILITY:   PHYSICIAN:  Jonelle Sports. Sevier, M.D.      DATE OF BIRTH:   DATE OF CONSULTATION:  03/26/2006  DATE OF DISCHARGE:                                   CONSULTATION   HISTORY:  This 65 year old white female is seen for evaluation and  management of a chronic traumatic wound in the right proximal pretibial  area, this in the face of poorly controlled diabetes and known vascular  disease.   Patient has been known to me for a number of years having previously been my  patient prior to my semi-retirement and is now patient of Dr. Romero Belling.  She has never cooperated enough to make it possible to manage her diabetes  with much satisfaction and indeed a recent hemoglobin A1c was 10.5.   In late May of this year she required hospitalization with a febrile illness  which proved to be acute pyelonephritis and on the day of that  hospitalization she fell at home and struck a stool sustaining a traumatic  laceration to the proximal pretibial area on the right.  She treated this  herself with an application of Neosporin and gauze dressing and says that  during the hospitalization she requested evaluation and treatment of the  wound, but none was given.   Once she left the hospitalization she continued her topical treatments but  when she saw Dr. Everardo All again he was concerned about some periwound  inflammation, gave her a brief course of Augmentin and referred her here.   She is here now for our evaluation and advice.   Past medical history is centered primarily around her diabetes which has  been complicated by retinal problems, renal infections, but not apparent  renal insufficiency, and also coronary heart disease.  She has  hypothyroidism which is compensated.  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   ALLERGIES:  KEFLEX, CIPRO, and ADHESIVE TAPE.   MEDICATIONS:  1.  Levothyroxine 25 mcg daily.  2.  Toprol XL 50 mg one-half tablet daily.  3.  Flomax 0.4 mg daily.  4.  Furosemide 20 mg daily.  5.  Lantus insulin 8 units nightly.  6.  Humalog insulin before meals by sliding scale.  7.  The recent course of Augmentin 875 b.i.d.   EXAMINATION:  Examination today is limited to the distal lower extremities.  The left lower extremity is free of edema, has no open lesions, has  diminished pulses, but will not be further described.   The right lower extremity is 2+ edematous to the knee.  Pulses are faint and  on Doppler determination are found to be  biphasic at the dorsalis pedis area  and triphasic at the posterior tibial area, but with continuous signal  consistent with probable arterial calcification and possibly with some  upstream obstruction.   Monofilament testing shows that she lacks protective sensation throughout  that foot and lower extremity.   The scar of her previous venous harvesting is noted over the medial calf and  is well healed.   The high pretibial area several centimeters distal to the tibial plateau is  a traumatic lesion covered now by dense black eschar with some surrounding  inflammation and a bit of puckering suggesting some wound contraction.  There is no active drainage at the time and nothing suspicious for deep  infection.   IMPRESSION:  1.  Traumatic wound proximal pretibial area right lower extremity.  2.  Diabetes mellitus type 1 poorly controlled.  3.  Hypertensive arteriosclerotic cardiovascular disease.   DISPOSITION:  1.  The wound is  sharply debrided of much of its eschar leaving a fairly      ratty base with still considerable exudate.  This is done without      anesthesia in that the patient is largely insensate and largely able to      tolerate what we did.  2.  The wound is then treated with an application of Accuzyme ointment      covered with an absorptive dressing then an absorptive pad and that      extremity is placed in Profore wrap from the toes to the high tibial      area in order to address the issue of her edema which is probably a      factor in delayed wound healing.   The patient is advised to notify us immediately if the dressing slips, if  she has any fever, chills, awareness of increased drainage, odor, or other  worrisome symptoms.  She is to notify us and we will get her back  immediately.   Otherwise, she is to leave the dressing unchanged, keep it clean and dry,  and to return here for follow-up visit in one week.           ______________________________  Jonelle Sports. Cheryll Cockayne, M.D.     RES/MEDQ  D:  03/26/2006  T:  03/26/2006  Job:  [id number]   cc:   Gregary Signs A. Everardo All, M.D. [facility]  [address]  [place]  [place] [address]

## 2011-03-02 NOTE — Discharge Summary (Signed)
[name], [name]           ACCOUNT NO.:  [id number]   MEDICAL RECORD NO.:  [id number]          PATIENT TYPE:  INP   LOCATION:  6733                         FACILITY:  [facility]   PHYSICIAN:  Sean A. Everardo All, M.D. [facility] OF BIRTH:  09-02-1946   DATE OF ADMISSION:  03/05/2006  DATE OF DISCHARGE:                                 DISCHARGE SUMMARY   DISCHARGE DIAGNOSES:  1.  Acute pyelonephritis.  2.  Diabetes type 2, uncontrolled.  3.  Incidental finding of pancreatic head mass.  4.  Acute renal insufficiency.  5.  Dyslipidemia.  6.  Iron deficiency anemia.  7.  Hypertension with tachycardia, questionable autonomic neuropathy.  8.  Probable pneumonia.   HISTORY OF PRESENT ILLNESS:  [name] is a 65 year old white female  initially admitted to teaching service with recurrent UTI who presented with  4-day history of fever, chills and previously-diagnosed UTI.  The patient  was admitted for pyelonephritis.   PAST MEDICAL HISTORY:  1.  Diabetes type 2.  2.  Recurrent UTIs.  3.  Hypothyroidism.  4.  Coronary artery disease status post CABG x3 in 1993.  5.  Scleroderma.  6.  Appendectomy in 1991.  7.  Bilateral carpal tunnel syndrome in the 1970s.  8.  Dyslipidemia.   COURSE OF HOSPITALIZATION:  #1 - ACUTE PYELONEPHRITIS.  The patient was  admitted and was initially placed on IV Zosyn.  Initial urine culture grew  60,000 colonies of E. coli.  Blood cultures drawn on admission were negative  x2.  Followup blood cultures drawn on May 24 remain no growth to date at the  time of this dictation.  The patient was changed to p.o. Augmentin and  remains afebrile.  Will continue for an additional 7 days as an outpatient.   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  This was an  incidental finding.  CT of the abdomen was deferred secondary to acute renal  insufficiency.  Plan to pursue outpatient followup per Dr. Everardo All for a CT  of the abdomen and possible  endoscopic ultrasound if necessary for biopsy.   #3 - PROBABLE PNEUMONIA.  The patient underwent a chest x-ray during this  admission and there was a questionable diffuse infiltrate which was thought  most likely be edema.  However, the patient was being covered for acute  pyelonephritis.   #4 - DIABETES TYPE 2, UNCONTROLLED.  The patient was noted during this  hospitalization to have an elevated hemoglobin A1c with a value of 10.5,  indicating poor control at home.  The patient was placed on Lantus during  this admission and was noted to have very labile blood sugars ranging from  low 100s up to 300s.  Per Dr. Everardo All at time of discharge, plan to  discharge the patient home on Humalog 8 units subcu prior to meals.  This  will need continued outpatient followup which was stressed with the patient  prior to discharge.   #5 - IRON DEFICIENCY ANEMIA.  The patient was noted to be anemic with a  hemoglobin of 10.4.  She was noted to have an iron  level of less than 10 and  will be started on p.o. iron supplementation at time of discharge.  Consider  outpatient workup if indicated.  This patient's last colonoscopy was greater  than 10 years ago per admission H&P.   #6 - DYSLIPIDEMIA.  Per patient, she was reportedly off of Lipitor for  greater than 8 months.  Followup lipid panel is stable during this  admission.  Will defer to the patient's primary care on resuming statin.   #7 - MILD HYPERTENSION WITH TACHYCARDIA.  The patient was noted to have mild  tachycardia during this admission which may be due to questionable  underlying autonomic neuropathy.  She will be started on Toprol-XL 25 mg  p.o. daily for rate control and blood pressure control.   #8 - ACUTE RENAL INSUFFICIENCY.  The patient's creatinine on admission was  1.9.  At time of discharge her creatinine is 1.3.  She will need followup  BMET at her appointment with Dr. Everardo All.   #9 - HYPOTHYROID.  The patient had reportedly  been off of Synthroid for 8  months as well and was noted to have an elevated TSH with a value of 8.952.  As a result, she was started on Synthroid 25 mcg p.o. daily which will be  continued at time of discharge.  She will need followup thyroid function  testing in approximately 1 month as an outpatient.   PERTINENT LABORATORY AT DISCHARGE:  BUN 19, creatinine 1.3.  Hemoglobin  10.4, hematocrit 30.6.   MEDICATIONS AT DISCHARGE:  1.  Aspirin 81 mg p.o. daily.  2.  Humalog 8 units subcu prior to meals.  3.  Multivitamin once p.o. daily.  4.  Iron 325 mg p.o. b.i.d.  5.  Toprol-XL 50 mg tablets one-half tablet p.o. daily.  6.  Levothyroxine 25 mcg p.o. daily.  7.  Flomax 0.4 mg p.o. daily.  8.  Augmentin 875 mg p.o. b.i.d. for 7 additional days.   DISPOSITION:  Plan to transfer the patient to home.   FOLLOWUP:  The patient is instructed to follow up with Dr. Everardo All early  this week and call for an appointment in the a.m.   SPECIAL INSTRUCTIONS AT TIME OF DISCHARGE:  The patient is instructed to  call Dr. Everardo All should she develop fever over 101 or should her blood  sugars start running greater than 300 or less than 80, or should she develop  increasing cough.      Melissa S. Peggyann Juba, NP    ______________________________  Cleophas Dunker Everardo All, M.D. [facility]    MSO/MEDQ  D:  03/11/2006  T:  03/11/2006  Job:  [id number]

## 2011-03-02 NOTE — Assessment & Plan Note (Signed)
[facility]   NAME:  [name], [name]           ACCOUNT NO.:  [id number]   MEDICAL RECORD NO.:  [id number]           DATE OF BIRTH:   PHYSICIAN:  Jonelle Sports. Sevier, M.D.  VISIT DATE:  04/10/2006                                     OFFICE VISIT   HISTORY:  This 65 year old white female with longstanding type 1 diabetes is  being followed for a traumatic ulcer on the high right pretibial area.   At her visit last week, a foreign body was identified in the proximal  portion of the wound, and when removed, left a fairly deep area there.   Since that time, the patient has been treating the wound with application of  Accuzyme every other day at home, covered simply by soft absorptive  dressing.   She reports no increased pain; no unusual bleeding or drainage; no spreading  erythema; no fever or systemic symptoms and no odor.   EXAMINATION:  VITAL SIGNS:  Blood pressure 100/50, pulse 64, respirations  18, temperature 98.3.  EXTREMITIES:  The lesion now is much cleaner, having previously had  considerable exudate in its base and now measures 1.9 x 0.8 x 0.2 cm, again  showing contraction in all dimensions.  The deep area previously noted after  removal of a small foreign body has seemed to fill-in.  There does remain a  bit of slough in a wound, particularly in its proximal portion and there is  a tiny eschar right against the proximal margin of the wound.   IMPRESSION:  Satisfactory progress, traumatic wound, in diabetic extremity.   DISPOSITION:  1.  The wound is sharply debrided of the eschar and as much as possible of      the slough.  This is done without anesthesia and is reasonably well-      tolerated.   The wound is then treated with reapplication of Accuzyme and covered by an  absorptive pad.  The area is then covered with a local dressing, held in  place by web stockinette.   The patient has completed a course of cephalexin and need not take more.  1.  Followup visit will be here in 2 weeks.           ______________________________  Jonelle Sports Cheryll Cockayne, M.D.     RES/MEDQ  D:  04/10/2006  T:  04/10/2006  Job:  [id number]

## 2011-03-07 ENCOUNTER — Other Ambulatory Visit: Payer: Medicare Other

## 2011-05-16 DIAGNOSIS — I509 Heart failure, unspecified: Secondary | ICD-10-CM

## 2011-05-16 HISTORY — DX: Heart failure, unspecified: I50.9

## 2011-05-24 ENCOUNTER — Other Ambulatory Visit: Payer: Self-pay | Admitting: *Deleted

## 2011-05-24 MED ORDER — METOPROLOL SUCCINATE ER 25 MG PO TB24: 25.0000 mg | ORAL_TABLET | Freq: Every day | ORAL | Status: DC

## 2011-05-24 MED ORDER — EZETIMIBE 10 MG PO TABS: 10.0000 mg | ORAL_TABLET | Freq: Every day | ORAL | Status: DC

## 2011-05-24 MED ORDER — QUINAPRIL HCL 10 MG PO TABS: 10.0000 mg | ORAL_TABLET | Freq: Every day | ORAL | Status: DC

## 2011-06-11 ENCOUNTER — Encounter: Payer: Self-pay | Admitting: Vascular Surgery

## 2011-06-12 ENCOUNTER — Other Ambulatory Visit (INDEPENDENT_AMBULATORY_CARE_PROVIDER_SITE_OTHER): Payer: Medicare Other

## 2011-06-12 ENCOUNTER — Encounter: Payer: Self-pay | Admitting: *Deleted

## 2011-06-12 ENCOUNTER — Encounter: Payer: Self-pay | Admitting: Vascular Surgery

## 2011-06-12 ENCOUNTER — Ambulatory Visit (INDEPENDENT_AMBULATORY_CARE_PROVIDER_SITE_OTHER): Payer: Medicare Other

## 2011-06-12 ENCOUNTER — Ambulatory Visit (INDEPENDENT_AMBULATORY_CARE_PROVIDER_SITE_OTHER): Payer: Medicare Other | Admitting: Vascular Surgery

## 2011-06-12 VITALS — BP 151/82 | HR 81 | Temp 98.2°F | Wt 175.0 lb

## 2011-06-12 DIAGNOSIS — Z48812 Encounter for surgical aftercare following surgery on the circulatory system: Secondary | ICD-10-CM

## 2011-06-12 DIAGNOSIS — L98499 Non-pressure chronic ulcer of skin of other sites with unspecified severity: Secondary | ICD-10-CM

## 2011-06-12 DIAGNOSIS — I70269 Atherosclerosis of native arteries of extremities with gangrene, unspecified extremity: Secondary | ICD-10-CM

## 2011-06-12 DIAGNOSIS — I739 Peripheral vascular disease, unspecified: Secondary | ICD-10-CM

## 2011-06-12 NOTE — Progress Notes (Signed)
Subjective:     Patient ID: [name], female   DOB: February 10, 1946, 65 y.o.   MRN: [id number]  HPI The patient presents today for followup of her left femoral anterior tibial bypass for critical limb ischemia. This was in July of 2010 she subsequently underwent coaptation debridement and then a transmetatarsal amputation. She has healed her transmetatarsal amputation completely.  Review of Systems No change    Objective:   Physical Exam    well-developed well-nourished white female in no acute distress. HEENT normal. Well-healed left transmetatarsal amputation. Palpable left femoral anterior tibial graft pulse.  Lower extremity bypass evaluation: Left ABI at 0.75, right 0.80 Assessment:     Stable feft fem anterior tibial bypass    Plan:     Continue usual activity. vascular lab followup in one year

## 2011-06-19 NOTE — Procedures (Unsigned)
BYPASS GRAFT EVALUATION  INDICATION:  Left lower extremity bypass graft.  HISTORY: Diabetes:  Yes. Cardiac:  No. Hypertension:  Yes. Smoking:  No. Previous Surgery:  Left femoral to anterior tibial artery bypass graft on 04/19/2009.  SINGLE LEVEL ARTERIAL EXAM                              RIGHT              LEFT Brachial: Anterior tibial: Posterior tibial: Peroneal: Ankle/brachial index:  PREVIOUS ABI:  Date:  RIGHT:  LEFT:  LOWER EXTREMITY BYPASS GRAFT DUPLEX EXAM:  DUPLEX:  Biphasic Doppler waveforms noted throughout the left lower extremity bypass graft with a velocity of 230 cm/s noted at the distal anastomosis.  This increased velocity appears to be due to change in vessel size and the angle of vessel take-off.  IMPRESSION:  Patent left femoral to anterior tibial artery bypass graft with velocities as described above.  The bilateral ankle brachial indices are noted on separate report.  ___________________________________________ Larina Earthly, M.D.  CH/MEDQ  D:  06/13/2011  T:  06/13/2011  Job:  [id number]

## 2011-07-16 LAB — GLUCOSE, CAPILLARY: Glucose-Capillary: 111 — ABNORMAL HIGH

## 2011-07-18 LAB — GLUCOSE, CAPILLARY
Glucose-Capillary: 107 — ABNORMAL HIGH
Glucose-Capillary: 116 — ABNORMAL HIGH
Glucose-Capillary: 125 — ABNORMAL HIGH
Glucose-Capillary: 144 — ABNORMAL HIGH
Glucose-Capillary: 164 — ABNORMAL HIGH
Glucose-Capillary: 167 — ABNORMAL HIGH
Glucose-Capillary: 186 — ABNORMAL HIGH
Glucose-Capillary: 194 — ABNORMAL HIGH
Glucose-Capillary: 227 — ABNORMAL HIGH
Glucose-Capillary: 230 — ABNORMAL HIGH
Glucose-Capillary: 239 — ABNORMAL HIGH
Glucose-Capillary: 58 — ABNORMAL LOW
Glucose-Capillary: 65 — ABNORMAL LOW
Glucose-Capillary: 78
Glucose-Capillary: 93
Glucose-Capillary: 98

## 2011-07-18 LAB — BASIC METABOLIC PANEL
Chloride: 105
GFR calc non Af Amer: >60
Glucose, Bld: 298 — ABNORMAL HIGH
Potassium: 3.5
Sodium: 139

## 2011-07-18 LAB — GRAM STAIN

## 2011-07-18 LAB — CBC
HCT: 33.4 — ABNORMAL LOW
Hemoglobin: 11.1 — ABNORMAL LOW
MCV: 91.4
RDW: 14.3

## 2011-07-18 LAB — ANAEROBIC CULTURE

## 2011-07-18 LAB — AFB CULTURE WITH SMEAR (NOT AT ARMC): Acid Fast Smear: NONE SEEN

## 2011-07-18 LAB — WOUND CULTURE

## 2011-08-17 ENCOUNTER — Telehealth: Payer: Self-pay | Admitting: Internal Medicine

## 2011-08-17 NOTE — Telephone Encounter (Signed)
New message Pt wants to talk you about her next appt Please call

## 2011-08-20 NOTE — Telephone Encounter (Signed)
Patient called complaining of SOB with exertion times one month. Offered appointment with PA for 11/6 but she declined and will come in on 11/7 to see [name]. NP.

## 2011-08-22 ENCOUNTER — Ambulatory Visit
Admission: RE | Admit: 2011-08-22 | Discharge: 2011-08-22 | Disposition: A | Discharge status: Home / Self Care | Payer: Medicare Other | Source: Ambulatory Visit | Attending: Nurse Practitioner | Admitting: Nurse Practitioner

## 2011-08-22 ENCOUNTER — Ambulatory Visit (INDEPENDENT_AMBULATORY_CARE_PROVIDER_SITE_OTHER): Payer: Medicare Other | Admitting: Nurse Practitioner

## 2011-08-22 ENCOUNTER — Encounter: Payer: Self-pay | Admitting: Nurse Practitioner

## 2011-08-22 ENCOUNTER — Telehealth: Payer: Self-pay | Admitting: *Deleted

## 2011-08-22 VITALS — BP 158/70 | HR 67 | Ht 63.5 in | Wt 182.1 lb

## 2011-08-22 DIAGNOSIS — R0600 Dyspnea, unspecified: Secondary | ICD-10-CM

## 2011-08-22 DIAGNOSIS — R0609 Other forms of dyspnea: Secondary | ICD-10-CM

## 2011-08-22 DIAGNOSIS — I10 Essential (primary) hypertension: Secondary | ICD-10-CM

## 2011-08-22 DIAGNOSIS — I2510 Atherosclerotic heart disease of native coronary artery without angina pectoris: Secondary | ICD-10-CM

## 2011-08-22 DIAGNOSIS — I350 Nonrheumatic aortic (valve) stenosis: Secondary | ICD-10-CM

## 2011-08-22 DIAGNOSIS — I359 Nonrheumatic aortic valve disorder, unspecified: Secondary | ICD-10-CM

## 2011-08-22 LAB — D-DIMER, QUANTITATIVE: D-Dimer, Quant: 0.73 ug/mL-FEU — ABNORMAL HIGH (ref 0.00–0.48)

## 2011-08-22 MED ORDER — ISOSORBIDE MONONITRATE ER 30 MG PO TB24: 30.0000 mg | ORAL_TABLET | Freq: Every day | ORAL | Status: DC

## 2011-08-22 NOTE — Telephone Encounter (Signed)
Called pt and advised that lab was slightly elevated and needed to do CT angio tomorrow. Also advised her to hold lasix tomorrow (Thurs) and to increase fluid intake. Routed to [name]. [name] knows to expect call in AM to schedule CT

## 2011-08-22 NOTE — Progress Notes (Signed)
[name] Date of Birth: 07/18/1946 Medical Record #[id number]  History of Present Illness: Ms. Pottle is seen today for a work in visit. She is seen for [name]. She has multiple medical problems which include IDDM, CKD, moderate AS, known CAD with remote CABG, PVD with prior amputation of toes with bypass grafting.  She comes in today for worsening shortness of breath. She was here back in May with the same complaint. She was to increase her Lasix. She says she got "busy with life" and really didn't follow thru. Her breathing has gotten much worse here lately. Because of her amputation of toes, her gait is not good and she has cramps. She says she can't do anything because her legs won't support her. She can therefore not exercise. She is short of breath with just minimal activities. She can wake up "gaspy". She denies chest pain but does report a heavy feeling in her upper chest when she is short of breath. She continues to be "puffy all over". She does not cough. No orthopnea. She is ok at rest. She has had some recent travel. No hemoptysis. She did get a recall letter and was to see [name] this month.   Current Outpatient Prescriptions on File Prior to Visit  Medication Sig Dispense Refill  . aspirin EC 325 MG EC tablet Take 325 mg by mouth daily.        [name] atorvastatin (LIPITOR) 40 MG tablet Take 40 mg by mouth at bedtime.        Tery Sanfilippo Sodium (COLACE PO) Take by mouth 2 (two) times daily as needed.       . ezetimibe (ZETIA) 10 MG tablet Take 1 tablet (10 mg total) by mouth daily.  30 tablet  6  . furosemide (LASIX) 40 MG tablet Take as directed  30 tablet  6  . insulin glargine (LANTUS) 100 UNIT/ML injection Inject 20 Units into the skin 2 (two) times daily. 20 in am, 10 units in pm      . insulin lispro (HUMALOG PEN) 100 UNIT/ML injection Use as directed       . latanoprost (XALATAN) 0.005 % ophthalmic solution Place 1 drop into both eyes at bedtime.        [name]  levothyroxine (SYNTHROID, LEVOTHROID) 125 MCG tablet Take 125 mcg by mouth daily.        . metoprolol succinate (TOPROL-XL) 25 MG 24 hr tablet Take 1 tablet (25 mg total) by mouth daily.  30 tablet  6  . quinapril (ACCUPRIL) 10 MG tablet Take 1 tablet (10 mg total) by mouth at bedtime.  30 tablet  6  . isosorbide mononitrate (IMDUR) 30 MG 24 hr tablet Take 1 tablet (30 mg total) by mouth daily.  30 tablet  11    Allergies  Allergen Reactions  . Adhesive (Tape)   . Cephalexin   . Ciprofloxacin     Past Medical History  Diagnosis Date  . HTN (hypertension)   . Hypothyroidism   . Diabetes mellitus, type 2     insulin dependent  . History of recurrent UTIs   . CAD (coronary artery disease)     s/p CABG x3 in 1993; last cath in 2010  . Scleroderma   . Bilateral carpal tunnel syndrome 1970s  . PVD (peripheral vascular disease)     Toes amputated from left foot and has had prior bypass on left leg  . Arthritis   . Anemia   .  Leg pain     with walking  . Aortic stenosis   . Hyperlipidemia   . Myocardial infarction 1986  . Seizure     ? seizure like event in 2010    Past Surgical History  Procedure Date  . Carpal tunnel release   . 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 Vitrectomy   . Tubal ligation   . Tonsillectomy   . Coronary artery bypass graft 08/2001  . Appendectomy   . Amputation     left first and second toes    History  Smoking status  . Never Smoker   Smokeless tobacco  . Not on file    History  Alcohol Use No    Family History  Problem Relation Age of Onset  . Diabetes Mother     Review of Systems: The review of systems is per the HPI. She is unsteady on her feet. No recent falls. Blood pressure is up and down. Blood sugars are up and down. She is a brittle diabetic. She has regular follow up at [facility] with [name].  All other systems were reviewed and are negative.  Physical Exam: BP 158/70  Pulse 67  Ht 5' 3.5" (1.613 m)  Wt 182 lb 1.9 oz (82.609 kg)  BMI  31.76 kg/m2  SpO2 97% Patient is very pleasant and in no acute distress. Skin is warm and dry. Color is normal.  HEENT is unremarkable. Normocephalic/atraumatic. PERRL. Sclera are nonicteric. Neck is supple. No masses. No JVD. Lungs are clear. Cardiac exam shows a regular rate and rhythm. She has a harsh murmur of AS.  Abdomen is soft. Extremities are with trace edema. Op scars on both legs. Gait and ROM are intact. No gross neurologic deficits noted.   LABORATORY DATA: PENDING   Assessment / Plan:

## 2011-08-22 NOTE — Assessment & Plan Note (Signed)
She has had remote CABG back in the early 1990's. Her last cath was in 2010 (she does not remember this procedure). She was cathed after having an abnormal stress myoview. She had a patent LIMA to the LAD. However the distal LAD is occluded after the graft insertion and retrograde is occluded as well. The SVG to a large marginal system was patent. The SVG to the presumed distal dominant circumflex was occluded. There was a moderately high grade stenosis of the LS but a distal stenoses involving the marginal branch that was not felt to be amenable to PCI. Medical therapy was continued. I have added Imdur 30 mg daily. She may need repeat cath. Will review with [name].

## 2011-08-22 NOTE — Patient Instructions (Addendum)
We are going to check some labs today.  I am going to send you for a CXR today. Go to [facility] - first floor is [employer]. You may walk right in.   I am going to start you on some long acting Nitroglycerin. It may cause a headache. You can try taking this medicine each night. Imdur 30 mg daily.  Stay on your other current medicines.   I will have you see [name] here in the next 1 to 2 weeks.

## 2011-08-22 NOTE — Assessment & Plan Note (Signed)
She reports blood pressure is up and down at home.

## 2011-08-22 NOTE — Assessment & Plan Note (Signed)
She has several possible etiologies for her dyspnea. She has aortic stenosis. Her last echo was just 6 months ago. She was felt to have moderate AS. She has known CAD with remote CABG. Last cath in 2010 and medical management was felt to be the best option. I do not think she has PE but has had recent travel. We will be checking BNP, BMET, CBC, D dimer and sending her for a CXR. I have left her on her current medicines for now but did add Imdur 30 mg daily.  I will have [name] review.

## 2011-08-22 NOTE — Assessment & Plan Note (Signed)
She has moderate AS. Last echo was just 6 months ago. She has some chest discomfort. No syncope. May in fact need both left and right heart cath. I will have [name] review. I have asked Ms. Erven to follow up with [name] in the next 1 to 2 weeks. Patient is agreeable to this plan and will call if any problems develop in the interim.

## 2011-08-23 ENCOUNTER — Ambulatory Visit (INDEPENDENT_AMBULATORY_CARE_PROVIDER_SITE_OTHER)
Admission: RE | Admit: 2011-08-23 | Discharge: 2011-08-23 | Disposition: A | Discharge status: Home / Self Care | Payer: Medicare Other | Source: Ambulatory Visit | Attending: Nurse Practitioner | Admitting: Nurse Practitioner

## 2011-08-23 DIAGNOSIS — R079 Chest pain, unspecified: Secondary | ICD-10-CM

## 2011-08-23 LAB — BASIC METABOLIC PANEL
BUN: 27 mg/dL — ABNORMAL HIGH (ref 6–23)
CO2: 28 mEq/L (ref 19–32)
Calcium: 8.8 mg/dL (ref 8.4–10.5)
Chloride: 103 mEq/L (ref 96–112)
Creatinine, Ser: 1.4 mg/dL — ABNORMAL HIGH (ref 0.4–1.2)
GFR: 40.98 mL/min — ABNORMAL LOW (ref >60.00)
Glucose, Bld: 278 mg/dL — ABNORMAL HIGH (ref 70–99)
Potassium: 5 mEq/L (ref 3.5–5.1)
Sodium: 137 mEq/L (ref 135–145)

## 2011-08-23 LAB — CBC WITH DIFFERENTIAL/PLATELET
Basophils Absolute: 0 10*3/uL (ref 0.0–0.1)
Basophils Relative: 0.1 % (ref 0.0–3.0)
Eosinophils Absolute: 0.4 10*3/uL (ref 0.0–0.7)
Eosinophils Relative: 4.9 % (ref 0.0–5.0)
HCT: 34.8 % — ABNORMAL LOW (ref 36.0–46.0)
Hemoglobin: 11.6 g/dL — ABNORMAL LOW (ref 12.0–15.0)
Lymphocytes Relative: 13.6 % (ref 12.0–46.0)
Lymphs Abs: 1 10*3/uL (ref 0.7–4.0)
MCHC: 33.3 g/dL (ref 30.0–36.0)
MCV: 92.8 fl (ref 78.0–100.0)
Monocytes Absolute: 0.6 10*3/uL (ref 0.1–1.0)
Monocytes Relative: 8.3 % (ref 3.0–12.0)
Neutro Abs: 5.5 10*3/uL (ref 1.4–7.7)
Neutrophils Relative %: 73.1 % (ref 43.0–77.0)
Platelets: 195 10*3/uL (ref 150.0–400.0)
RBC: 3.75 Mil/uL — ABNORMAL LOW (ref 3.87–5.11)
RDW: 14.5 % (ref 11.5–14.6)
WBC: 7.6 10*3/uL (ref 4.5–10.5)

## 2011-08-23 LAB — BRAIN NATRIURETIC PEPTIDE: Pro B Natriuretic peptide (BNP): 404 pg/mL — ABNORMAL HIGH (ref 0.0–100.0)

## 2011-08-23 MED ORDER — IOHEXOL 300 MG/ML  SOLN
80.0000 mL | Freq: Once | INTRAMUSCULAR | Status: AC | PRN
  Administered 2011-08-23: 80 mL via INTRAVENOUS

## 2011-08-27 ENCOUNTER — Other Ambulatory Visit: Payer: Self-pay | Admitting: *Deleted

## 2011-08-27 DIAGNOSIS — I509 Heart failure, unspecified: Secondary | ICD-10-CM

## 2011-08-27 MED ORDER — FUROSEMIDE 20 MG PO TABS: ORAL_TABLET | ORAL | Status: DC

## 2011-08-28 ENCOUNTER — Telehealth: Payer: Self-pay | Admitting: Internal Medicine

## 2011-08-28 NOTE — Telephone Encounter (Signed)
Spoke to patient  Just started lasix last night   I asked her to call Fri

## 2011-08-28 NOTE — Telephone Encounter (Signed)
Message copied by Pricilla Riffle on Tue Aug 28, 2011  5:01 PM ------      Message from: Rosalio Macadamia      Created: Wed Aug 22, 2011  4:52 PM       [name],       Please review my note regarding the above patient. I sent her for a CXR and got some labs. Started her on low dose Imdur. May need right and left heart cath. I am out of the office Thursday and Friday. She is to come back in the next 1 to 2 weeks. You did not have any spots on your schedule so she has been put on mine on a day that you are here in the office.             Thanks      Lawson Fiscal

## 2011-08-31 ENCOUNTER — Telehealth: Payer: Self-pay | Admitting: Internal Medicine

## 2011-08-31 NOTE — Telephone Encounter (Signed)
New Problem:  She has been having serious shortness of breath and she is really concerned. She says that her increase in Lasix and nitro pills are helping alittle but not much.

## 2011-08-31 NOTE — Telephone Encounter (Signed)
LMTCB ./CY 

## 2011-09-03 NOTE — Telephone Encounter (Signed)
Patient was called states she is sob with the least amount of exertion,legs are weak.Has increased lasix to 60mg s daily which has helped some,but wants to know what to do about sob,has appointment with [facility] 11/26.

## 2011-09-03 NOTE — Telephone Encounter (Signed)
I talked with [name] about this patient. Will probably need to proceed on with left and right heart catheterization to further evaluate her symptoms. Has aortic stenosis and has had remote CABG.

## 2011-09-07 ENCOUNTER — Ambulatory Visit (INDEPENDENT_AMBULATORY_CARE_PROVIDER_SITE_OTHER): Payer: Medicare Other | Admitting: Nurse Practitioner

## 2011-09-07 ENCOUNTER — Encounter (HOSPITAL_COMMUNITY): Payer: Self-pay | Admitting: Pharmacy Technician

## 2011-09-07 ENCOUNTER — Encounter: Payer: Self-pay | Admitting: Nurse Practitioner

## 2011-09-07 ENCOUNTER — Encounter: Payer: Medicare Other | Admitting: Nurse Practitioner

## 2011-09-07 VITALS — BP 138/60 | HR 62 | Ht 63.5 in | Wt 177.8 lb

## 2011-09-07 DIAGNOSIS — Z01818 Encounter for other preprocedural examination: Secondary | ICD-10-CM

## 2011-09-07 DIAGNOSIS — I359 Nonrheumatic aortic valve disorder, unspecified: Secondary | ICD-10-CM

## 2011-09-07 DIAGNOSIS — R0989 Other specified symptoms and signs involving the circulatory and respiratory systems: Secondary | ICD-10-CM

## 2011-09-07 DIAGNOSIS — I350 Nonrheumatic aortic (valve) stenosis: Secondary | ICD-10-CM

## 2011-09-07 DIAGNOSIS — Z8679 Personal history of other diseases of the circulatory system: Secondary | ICD-10-CM

## 2011-09-07 DIAGNOSIS — R0600 Dyspnea, unspecified: Secondary | ICD-10-CM

## 2011-09-07 DIAGNOSIS — I2510 Atherosclerotic heart disease of native coronary artery without angina pectoris: Secondary | ICD-10-CM

## 2011-09-07 DIAGNOSIS — R079 Chest pain, unspecified: Secondary | ICD-10-CM

## 2011-09-07 DIAGNOSIS — E785 Hyperlipidemia, unspecified: Secondary | ICD-10-CM

## 2011-09-07 DIAGNOSIS — R0609 Other forms of dyspnea: Secondary | ICD-10-CM

## 2011-09-07 LAB — CBC WITH DIFFERENTIAL/PLATELET
Basophils Absolute: 0 10*3/uL (ref 0.0–0.1)
Basophils Relative: 0.4 % (ref 0.0–3.0)
Eosinophils Absolute: 0.4 10*3/uL (ref 0.0–0.7)
Eosinophils Relative: 5.1 % — ABNORMAL HIGH (ref 0.0–5.0)
HCT: 33.3 % — ABNORMAL LOW (ref 36.0–46.0)
Hemoglobin: 11.3 g/dL — ABNORMAL LOW (ref 12.0–15.0)
Lymphocytes Relative: 16.2 % (ref 12.0–46.0)
Lymphs Abs: 1.2 10*3/uL (ref 0.7–4.0)
MCHC: 33.8 g/dL (ref 30.0–36.0)
MCV: 92.2 fl (ref 78.0–100.0)
Monocytes Absolute: 0.9 10*3/uL (ref 0.1–1.0)
Monocytes Relative: 11.4 % (ref 3.0–12.0)
Neutro Abs: 5 10*3/uL (ref 1.4–7.7)
Neutrophils Relative %: 66.9 % (ref 43.0–77.0)
Platelets: 214 10*3/uL (ref 150.0–400.0)
RBC: 3.62 Mil/uL — ABNORMAL LOW (ref 3.87–5.11)
RDW: 14.2 % (ref 11.5–14.6)
WBC: 7.5 10*3/uL (ref 4.5–10.5)

## 2011-09-07 LAB — BASIC METABOLIC PANEL
BUN: 33 mg/dL — ABNORMAL HIGH (ref 6–23)
CO2: 29 mEq/L (ref 19–32)
Calcium: 8.9 mg/dL (ref 8.4–10.5)
Chloride: 100 mEq/L (ref 96–112)
Creatinine, Ser: 1.5 mg/dL — ABNORMAL HIGH (ref 0.4–1.2)
GFR: 37.48 mL/min — ABNORMAL LOW (ref >60.00)
Glucose, Bld: 135 mg/dL — ABNORMAL HIGH (ref 70–99)
Potassium: 3.9 mEq/L (ref 3.5–5.1)
Sodium: 138 mEq/L (ref 135–145)

## 2011-09-07 LAB — PROTIME-INR
INR: 1 ratio (ref 0.8–1.0)
Prothrombin Time: 11.1 s (ref 10.2–12.4)

## 2011-09-07 LAB — APTT: aPTT: 29.5 s — ABNORMAL HIGH (ref 21.7–28.8)

## 2011-09-07 MED ORDER — ATORVASTATIN CALCIUM 40 MG PO TABS: 40.0000 mg | ORAL_TABLET | Freq: Every day | ORAL | Status: DC

## 2011-09-07 NOTE — Progress Notes (Signed)
 [name] Date of Birth: 06/07/1946 Medical Record #[id number]  History of Present Illness: [name] is seen back today for a follow up visit. She is subsequently seen with Dr. Ross. She has multiple medical issues which included known CAD with remote CABG in 1992/93, PVD with remote partial foot amputation & prior left femoral to anterior tibial bypass grafting in 2010, brittle diabetes and aortic stenosis. She has had progressive shortness of breath with some chest heaviness in the neck and substernal area. We started her on low dose nitrate therapy and increased her diuretics. D dimer was slightly elevated and she underwent CT scan which was negative for PE. CXR showed fluid. While her edema has improved, her shortness of breath has not. She continues to have DOE with just minimal activities. Continues to have the chest heaviness as well. She reports that she is "just miserable". She is now referred on for repeat left and right heart catheterization.   Current Outpatient Prescriptions on File Prior to Visit  Medication Sig Dispense Refill  . aspirin EC 325 MG EC tablet Take 325 mg by mouth daily.        . Docusate Sodium (COLACE PO) Take by mouth 2 (two) times daily as needed.       . ezetimibe (ZETIA) 10 MG tablet Take 1 tablet (10 mg total) by mouth daily.  30 tablet  6  . furosemide (LASIX) 20 MG tablet Take 2 tabs in the am and take 1 tab in the pm  90 tablet  6  . insulin glargine (LANTUS) 100 UNIT/ML injection Inject 20 Units into the skin 2 (two) times daily. 20 in am, 10 units in pm      . insulin lispro (HUMALOG PEN) 100 UNIT/ML injection Use as directed       . isosorbide mononitrate (IMDUR) 30 MG 24 hr tablet Take 1 tablet (30 mg total) by mouth daily.  30 tablet  11  . latanoprost (XALATAN) 0.005 % ophthalmic solution Place 1 drop into both eyes at bedtime.        . levothyroxine (SYNTHROID, LEVOTHROID) 125 MCG tablet Take 125 mcg by mouth daily.        . metoprolol  succinate (TOPROL-XL) 25 MG 24 hr tablet Take 1 tablet (25 mg total) by mouth daily.  30 tablet  6  . quinapril (ACCUPRIL) 10 MG tablet Take 1 tablet (10 mg total) by mouth at bedtime.  30 tablet  6  . DISCONTD: atorvastatin (LIPITOR) 40 MG tablet Take 40 mg by mouth at bedtime.          Allergies  Allergen Reactions  . Adhesive (Tape)   . Cephalexin   . Ciprofloxacin     Past Medical History  Diagnosis Date  . HTN (hypertension)   . Hypothyroidism   . Diabetes mellitus, type 2     insulin dependent  . History of recurrent UTIs   . CAD (coronary artery disease)     s/p CABG x3 in 1993; last cath in 2010  . Scleroderma   . Bilateral carpal tunnel syndrome 1970s  . 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 Followed by Dr. Early  . Arthritis   . Anemia   . Claudication   . Aortic stenosis     Moderate per echo in May 2012  . Hyperlipidemia     on Lipitor  . Myocardial infarction 1986  .   Seizure     ? seizure like event in 2010; workup included stress testing which led to repeat cath.   . Cat bite 2009    with MRSA; required I & D    Past Surgical History  Procedure Date  . Carpal tunnel release   . 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 Vitrectomy   . Tubal ligation   . Tonsillectomy   . Coronary artery bypass graft 1993    LIMA to LAD, SVG to distal LCX & SVG to Marginal  . Appendectomy   . Amputation     left first and second toes  . Cardiac catheterization 2010    LIMA to LAD patent yet with occluded distal LAD after graft insertion & retrograde is occluded. 3-4 septal perforators arise &are patent. SVG to a large marginal patent; SVG presumably to the distal dominant LCX is occluded, moderately high grade stenosis of the AV circumflex, but with distal stenoses involving a severely diseased marginal branch not amenable  to PCI  nor is inferior branch     History  Smoking status  . Never Smoker   Smokeless tobacco    . Not on file    History  Alcohol Use No    Family History  Problem Relation Age of Onset  . Diabetes Mother     Review of Systems: The review of systems is positive for DOE and chest heaviness. Her gait remains unsteady from prior amputation. No falls. No fever or chills. No syncope reported. Not really lightheaded or dizzy.  All other systems were reviewed and are negative.  Physical Exam: BP 138/60  Pulse 62  Ht 5' 3.5" (1.613 m)  Wt 177 lb 12.8 oz (80.65 kg)  BMI 31.00 kg/m2 Patient is pleasant and in no acute distress. Does not appear as "puffy" today. Skin is warm and dry. Color is normal.  HEENT is unremarkable. Normocephalic/atraumatic. PERRL. Sclera are nonicteric. Neck is supple. No masses. No JVD. Lungs are clear. Cardiac exam shows a regular rate and rhythm. She has a high pitched outflow murmur. Abdomen is soft. Extremities are without edema. Pulses are diminished. Legs are very dry and scaly. Gait and ROM are intact. No gross neurologic deficits noted.  LABORATORY DATA: PENDING      Assessment / Plan:  

## 2011-09-07 NOTE — Assessment & Plan Note (Signed)
Has known PVD with prior bypass and prior partial foot amputation. Last ABI's in August of 2012 showed 0.8 on the right and 0.75 on the left. She is followed annually by [name].

## 2011-09-07 NOTE — Assessment & Plan Note (Signed)
Her last cath was back in 2010. She has been managed medically since that time. Now presenting with progressive DOE and chest heaviness. Will proceed on with repeat study.

## 2011-09-07 NOTE — Patient Instructions (Signed)
We are going to arrange for a heart catheterization for next Friday.  You are scheduled for a cardiac catheterization on Friday, November 30th with Dr. Riley Kill or associate.  Go to [facility] 2nd Floor Short Stay on Friday at 7am. No food or drink after midnight on Thursday. You may take your medications with a sip of water on the day of your procedure.    Hold your insulin Friday morning.  Take half dose of your Lantus on Thursday night.  We will be checking all of your labs today.

## 2011-09-07 NOTE — Assessment & Plan Note (Signed)
Her AS was felt to be moderate per last echo in May. She is short of breath and having chest heaviness. No syncope reported. She will undergo right heart cath as well.

## 2011-09-07 NOTE — Assessment & Plan Note (Signed)
Her shortness of breath continues. Patient is subsequently seen with [name]. It is felt that proceeding on with left and right heart catheterization is warranted. This pocedure has been reviewed with her and she is willing to proceed with Dr. Riley Kill next Friday, November 30th. Patient is agreeable to this plan and will call if any problems develop in the interim.

## 2011-09-10 ENCOUNTER — Ambulatory Visit: Payer: Medicare Other | Admitting: Nurse Practitioner

## 2011-09-10 ENCOUNTER — Other Ambulatory Visit: Payer: Self-pay | Admitting: *Deleted

## 2011-09-10 DIAGNOSIS — N289 Disorder of kidney and ureter, unspecified: Secondary | ICD-10-CM

## 2011-09-10 NOTE — Progress Notes (Signed)
This encounter was created in error - please disregard.

## 2011-09-11 ENCOUNTER — Other Ambulatory Visit: Payer: Medicare Other | Admitting: *Deleted

## 2011-09-13 ENCOUNTER — Ambulatory Visit (INDEPENDENT_AMBULATORY_CARE_PROVIDER_SITE_OTHER): Payer: Medicare Other | Admitting: *Deleted

## 2011-09-13 ENCOUNTER — Telehealth: Payer: Self-pay | Admitting: Internal Medicine

## 2011-09-13 ENCOUNTER — Other Ambulatory Visit: Payer: Self-pay | Admitting: Internal Medicine

## 2011-09-13 DIAGNOSIS — N289 Disorder of kidney and ureter, unspecified: Secondary | ICD-10-CM

## 2011-09-13 LAB — BASIC METABOLIC PANEL
BUN: 35 mg/dL — ABNORMAL HIGH (ref 8–27)
CO2: 25 mmol/L (ref 20–32)
Calcium: 9.1 mg/dL (ref 8.6–10.2)
Creatinine, Ser: 1.54 mg/dL — ABNORMAL HIGH (ref 0.57–1.00)
Glucose: 321 mg/dL — ABNORMAL HIGH (ref 65–99)
Sodium: 138 mmol/L (ref 134–144)

## 2011-09-13 NOTE — Telephone Encounter (Addendum)
Patient unable to be admitted into hospital this evening. Can go in at 6 am tomorrow for IV hydration to the Short Stay Center at [facility]. Cath moved to 12 noon 11/30 by Felicia in the cath lab. Dr.Stuckey aware of above. Dr.Ross will complete hydration orders and order stat creatine for tomorrow mid day.

## 2011-09-13 NOTE — Telephone Encounter (Signed)
Pt stopped lasix for labs drawn today and she is having a cath in the am and needs to know if she needs to stay off or can she start taking it again

## 2011-09-13 NOTE — Telephone Encounter (Signed)
F/u: pt was left a message from [name] regarding her cath tomorrow and lab results.  Please call her back and discuss.  [name] was to talk to Dr. Riley Kill.  Unable to reach by phone from 5:30-9:30pm.

## 2011-09-13 NOTE — Telephone Encounter (Signed)
Called and LMOM to continue to hold lasix until we get back her Bmet from today.

## 2011-09-14 ENCOUNTER — Observation Stay (HOSPITAL_COMMUNITY)
Admission: AD | Admit: 2011-09-14 | Discharge: 2011-09-15 | Disposition: A | Discharge status: Home / Self Care | Payer: Medicare Other | Source: Ambulatory Visit | Attending: Cardiology | Admitting: Cardiology

## 2011-09-14 ENCOUNTER — Other Ambulatory Visit: Payer: Self-pay

## 2011-09-14 ENCOUNTER — Encounter (HOSPITAL_COMMUNITY): Payer: Self-pay

## 2011-09-14 ENCOUNTER — Encounter (HOSPITAL_COMMUNITY): Payer: Self-pay | Admitting: General Practice

## 2011-09-14 ENCOUNTER — Ambulatory Visit (HOSPITAL_COMMUNITY): Admit: 2011-09-14 | Payer: Self-pay | Admitting: Cardiology

## 2011-09-14 ENCOUNTER — Encounter (HOSPITAL_COMMUNITY)
Admission: AD | Disposition: A | Discharge status: Home / Self Care | Payer: Self-pay | Source: Ambulatory Visit | Attending: Cardiology

## 2011-09-14 DIAGNOSIS — I359 Nonrheumatic aortic valve disorder, unspecified: Secondary | ICD-10-CM | POA: Insufficient documentation

## 2011-09-14 DIAGNOSIS — E1151 Type 2 diabetes mellitus with diabetic peripheral angiopathy without gangrene: Secondary | ICD-10-CM | POA: Insufficient documentation

## 2011-09-14 DIAGNOSIS — I2510 Atherosclerotic heart disease of native coronary artery without angina pectoris: Secondary | ICD-10-CM | POA: Insufficient documentation

## 2011-09-14 DIAGNOSIS — I10 Essential (primary) hypertension: Secondary | ICD-10-CM | POA: Insufficient documentation

## 2011-09-14 DIAGNOSIS — I200 Unstable angina: Principal | ICD-10-CM | POA: Insufficient documentation

## 2011-09-14 DIAGNOSIS — E785 Hyperlipidemia, unspecified: Secondary | ICD-10-CM | POA: Insufficient documentation

## 2011-09-14 DIAGNOSIS — IMO0002 Reserved for concepts with insufficient information to code with codable children: Secondary | ICD-10-CM | POA: Insufficient documentation

## 2011-09-14 DIAGNOSIS — E039 Hypothyroidism, unspecified: Secondary | ICD-10-CM | POA: Insufficient documentation

## 2011-09-14 DIAGNOSIS — Z5309 Procedure and treatment not carried out because of other contraindication: Secondary | ICD-10-CM | POA: Insufficient documentation

## 2011-09-14 DIAGNOSIS — R0602 Shortness of breath: Secondary | ICD-10-CM | POA: Insufficient documentation

## 2011-09-14 DIAGNOSIS — R944 Abnormal results of kidney function studies: Secondary | ICD-10-CM | POA: Insufficient documentation

## 2011-09-14 DIAGNOSIS — I350 Nonrheumatic aortic (valve) stenosis: Secondary | ICD-10-CM

## 2011-09-14 HISTORY — DX: Gastro-esophageal reflux disease without esophagitis: K21.9

## 2011-09-14 HISTORY — DX: Reserved for inherently not codable concepts without codable children: IMO0001

## 2011-09-14 HISTORY — DX: Encounter for other specified aftercare: Z51.89

## 2011-09-14 LAB — GLUCOSE, CAPILLARY
Glucose-Capillary: 171 mg/dL — ABNORMAL HIGH (ref 70–99)
Glucose-Capillary: 144 mg/dL — ABNORMAL HIGH (ref 70–99)
Glucose-Capillary: 200 mg/dL — ABNORMAL HIGH (ref 70–99)

## 2011-09-14 LAB — BASIC METABOLIC PANEL
CO2: 27 mEq/L (ref 19–32)
Chloride: 105 mEq/L (ref 96–112)
Chloride: 106 mEq/L (ref 96–112)
GFR calc Af Amer: 45 mL/min — ABNORMAL LOW (ref >90)
GFR calc Af Amer: 46 mL/min — ABNORMAL LOW (ref >90)
GFR calc non Af Amer: 39 mL/min — ABNORMAL LOW (ref >90)
Glucose, Bld: 292 mg/dL — ABNORMAL HIGH (ref 70–99)
Potassium: 4.4 mEq/L (ref 3.5–5.1)
Potassium: 4.4 mEq/L (ref 3.5–5.1)
Sodium: 138 mEq/L (ref 135–145)

## 2011-09-14 LAB — MRSA PCR SCREENING: MRSA by PCR: POSITIVE — AB

## 2011-09-14 SURGERY — LEFT AND RIGHT HEART CATHETERIZATION WITH CORONARY ANGIOGRAM
Anesthesia: Moderate Sedation | Laterality: Right

## 2011-09-14 SURGERY — LEFT AND RIGHT HEART CATHETERIZATION WITH CORONARY/GRAFT ANGIOGRAM
Anesthesia: LOCAL | Laterality: Right

## 2011-09-14 SURGERY — LEFT AND RIGHT HEART CATHETERIZATION WITH CORONARY/GRAFT ANGIOGRAM
Anesthesia: Moderate Sedation

## 2011-09-14 MED ORDER — SODIUM CHLORIDE 0.9 % IJ SOLN: 3.0000 mL | Freq: Two times a day (BID) | INTRAMUSCULAR | Status: DC

## 2011-09-14 MED ORDER — METOPROLOL SUCCINATE ER 25 MG PO TB24
25.0000 mg | ORAL_TABLET | Freq: Every day | ORAL | Status: DC
  Administered 2011-09-15: 25 mg via ORAL
  Filled 2011-09-14 (×2): qty 1

## 2011-09-14 MED ORDER — ROSUVASTATIN CALCIUM 20 MG PO TABS
20.0000 mg | ORAL_TABLET | Freq: Every day | ORAL | Status: DC
  Administered 2011-09-14: 20 mg via ORAL
  Filled 2011-09-14 (×2): qty 1

## 2011-09-14 MED ORDER — LISINOPRIL 10 MG PO TABS
10.0000 mg | ORAL_TABLET | Freq: Every day | ORAL | Status: DC
  Administered 2011-09-14: 10 mg via ORAL
  Filled 2011-09-14 (×2): qty 1

## 2011-09-14 MED ORDER — INSULIN ASPART 100 UNIT/ML ~~LOC~~ SOLN
0.0000 [IU] | Freq: Three times a day (TID) | SUBCUTANEOUS | Status: DC
  Administered 2011-09-14: 2 [IU] via SUBCUTANEOUS
  Administered 2011-09-15: 8 [IU] via SUBCUTANEOUS
  Administered 2011-09-15: 5 [IU] via SUBCUTANEOUS
  Filled 2011-09-14: qty 3

## 2011-09-14 MED ORDER — SODIUM CHLORIDE 0.9 % IV SOLN: 250.0000 mL | INTRAVENOUS | Status: DC | PRN

## 2011-09-14 MED ORDER — SODIUM CHLORIDE 0.9 % IJ SOLN
3.0000 mL | Freq: Two times a day (BID) | INTRAMUSCULAR | Status: DC
  Administered 2011-09-14: 3 mL via INTRAVENOUS

## 2011-09-14 MED ORDER — SODIUM CHLORIDE 0.9 % IV SOLN
INTRAVENOUS | Status: DC
Start: 2011-09-15 — End: 2011-09-14

## 2011-09-14 MED ORDER — ASPIRIN 81 MG PO CHEW: 324.0000 mg | CHEWABLE_TABLET | Freq: Once | ORAL | Status: DC

## 2011-09-14 MED ORDER — ONDANSETRON HCL 4 MG/2ML IJ SOLN: 4.0000 mg | Freq: Four times a day (QID) | INTRAMUSCULAR | Status: DC | PRN

## 2011-09-14 MED ORDER — LATANOPROST 0.005 % OP SOLN
1.0000 [drp] | Freq: Every day | OPHTHALMIC | Status: DC
  Administered 2011-09-14: 1 [drp] via OPHTHALMIC
  Filled 2011-09-14: qty 2.5

## 2011-09-14 MED ORDER — SODIUM CHLORIDE 0.9 % IV SOLN
INTRAVENOUS | Status: AC
  Administered 2011-09-14: 09:00:00 via INTRAVENOUS

## 2011-09-14 MED ORDER — ASPIRIN EC 325 MG PO TBEC
325.0000 mg | DELAYED_RELEASE_TABLET | Freq: Every day | ORAL | Status: DC
  Administered 2011-09-15: 325 mg via ORAL
  Filled 2011-09-14: qty 1

## 2011-09-14 MED ORDER — SODIUM CHLORIDE 0.9 % IV BOLUS (SEPSIS)
250.0000 mL | Freq: Once | INTRAVENOUS | Status: AC
  Administered 2011-09-14: 250 mL via INTRAVENOUS

## 2011-09-14 MED ORDER — DOCUSATE SODIUM 100 MG PO CAPS
100.0000 mg | ORAL_CAPSULE | Freq: Every day | ORAL | Status: DC
  Filled 2011-09-14 (×2): qty 1

## 2011-09-14 MED ORDER — BRIMONIDINE TARTRATE 0.2 % OP SOLN
1.0000 [drp] | Freq: Two times a day (BID) | OPHTHALMIC | Status: DC
  Administered 2011-09-14 – 2011-09-15 (×2): 1 [drp] via OPHTHALMIC
  Filled 2011-09-14: qty 5

## 2011-09-14 MED ORDER — ASPIRIN 81 MG PO CHEW: 324.0000 mg | CHEWABLE_TABLET | ORAL | Status: DC

## 2011-09-14 MED ORDER — NITROGLYCERIN 0.4 MG SL SUBL: 0.4000 mg | SUBLINGUAL_TABLET | SUBLINGUAL | Status: DC | PRN

## 2011-09-14 MED ORDER — SODIUM CHLORIDE 0.9 % IJ SOLN: 3.0000 mL | INTRAMUSCULAR | Status: DC | PRN

## 2011-09-14 MED ORDER — SODIUM CHLORIDE 0.9 % IV SOLN: 250.0000 mL | INTRAVENOUS | Status: DC

## 2011-09-14 MED ORDER — EZETIMIBE 10 MG PO TABS
10.0000 mg | ORAL_TABLET | Freq: Every day | ORAL | Status: DC
  Administered 2011-09-14: 10 mg via ORAL
  Filled 2011-09-14 (×2): qty 1

## 2011-09-14 MED ORDER — LEVOTHYROXINE SODIUM 125 MCG PO TABS
125.0000 ug | ORAL_TABLET | Freq: Every day | ORAL | Status: DC
  Administered 2011-09-15: 125 ug via ORAL
  Filled 2011-09-14 (×2): qty 1

## 2011-09-14 MED ORDER — DIAZEPAM 5 MG PO TABS: 10.0000 mg | ORAL_TABLET | ORAL | Status: AC

## 2011-09-14 MED ORDER — INSULIN GLARGINE 100 UNIT/ML ~~LOC~~ SOLN
22.0000 [IU] | Freq: Two times a day (BID) | SUBCUTANEOUS | Status: DC
  Administered 2011-09-14: 10 [IU] via SUBCUTANEOUS
  Administered 2011-09-15: 22 [IU] via SUBCUTANEOUS
  Filled 2011-09-14: qty 3

## 2011-09-14 MED ORDER — ACETAMINOPHEN 325 MG PO TABS: 650.0000 mg | ORAL_TABLET | ORAL | Status: DC | PRN

## 2011-09-14 MED ORDER — ISOSORBIDE MONONITRATE ER 30 MG PO TB24
30.0000 mg | ORAL_TABLET | Freq: Every day | ORAL | Status: DC
  Administered 2011-09-14: 30 mg via ORAL
  Filled 2011-09-14 (×2): qty 1

## 2011-09-14 NOTE — Progress Notes (Signed)
Subjective:  She is doing well.  We hydrated her today, but her BUN and Cr remain elevated today.  We reviewed her numbers and her BUN/Cr normal one year ago.  Objective:  Vital Signs in the last 24 hours: Temp:  [97.8 F (36.6 C)-98 F (36.7 C)] 98 F (36.7 C) (11/30 1400) Pulse Rate:  [52-64] 52  (11/30 1400) Resp:  [18] 18  (11/30 1400) BP: (110-145)/(57-73) 110/57 mmHg (11/30 1400) SpO2:  [99 %] 99 % (11/30 1400)  Intake/Output from previous day:     Physical Exam: General: Well developed, well nourished, in no acute distress. Head:  Normocephalic and atraumatic. Lungs: Clear to auscultation and percussion. Heart: Normal S1 and prom SEM.  No DM. Pulses: Pulses normal in all 4 extremities. Extremities: No clubbing or cyanosis. No edema. Neurologic: Alert and oriented x 3.    Lab Results: No results found for this basename: WBC:2,HGB:2,PLT:2 in the last 72 hours  Basename 09/14/11 1348 09/14/11 0930  NA 138 138  K 4.4 4.4  CL 106 105  CO2 27 24  GLUCOSE 178* 292*  BUN 35* 35*  CREATININE 1.36* 1.38*   No results found for this basename: TROPONINI:2,CK,MB:2 in the last 72 hours Hepatic Function Panel No results found for this basename: PROT,ALBUMIN,AST,ALT,ALKPHOS,BILITOT,BILIDIR,IBILI in the last 72 hours No results found for this basename: CHOL in the last 72 hours No results found for this basename: PROTIME in the last 72 hours  Imaging: No results found.    Assessment/Plan:    Aortic stenosis (08/22/2011)   Assessment: prom by exam   Plan: Defer cath awaiting renal function CKD   BUN and Cr normal one year ago.   Mild elevation remains.  She is high risk with IDDM.  Plan continued gentle hydration.  If her BUN/Cr improved by am, she could be dcd as LOA to return Mon.  Her diuretics would need to be held.   [name] 3:36 PM 09/14/2011       [name], MD, [facility], FSCAI 09/14/2011, 3:29 PM

## 2011-09-14 NOTE — Progress Notes (Signed)
I came to see the patient.  She is not listed on the census.  Her Cr remains up at 1.38.  Will recheck at 2pm and reassess risk for cath.  Will give liquids until that time.   [name] 11:19 AM 09/14/2011

## 2011-09-15 DIAGNOSIS — I2510 Atherosclerotic heart disease of native coronary artery without angina pectoris: Secondary | ICD-10-CM

## 2011-09-15 LAB — BASIC METABOLIC PANEL
Chloride: 106 mEq/L (ref 96–112)
Creatinine, Ser: 1.37 mg/dL — ABNORMAL HIGH (ref 0.50–1.10)
GFR calc Af Amer: 45 mL/min — ABNORMAL LOW (ref >90)
Potassium: 4.6 mEq/L (ref 3.5–5.1)
Sodium: 138 mEq/L (ref 135–145)

## 2011-09-15 LAB — CBC
Platelets: 150 10*3/uL (ref 150–400)
RDW: 13.8 % (ref 11.5–15.5)
WBC: 6 10*3/uL (ref 4.0–10.5)

## 2011-09-15 LAB — GLUCOSE, CAPILLARY
Glucose-Capillary: 282 mg/dL — ABNORMAL HIGH (ref 70–99)
Glucose-Capillary: 356 mg/dL — ABNORMAL HIGH (ref 70–99)

## 2011-09-15 MED ORDER — FUROSEMIDE 20 MG PO TABS: ORAL_TABLET | ORAL | Status: DC

## 2011-09-15 MED ORDER — NITROGLYCERIN 0.4 MG SL SUBL: 0.4000 mg | SUBLINGUAL_TABLET | SUBLINGUAL | Status: DC | PRN

## 2011-09-15 NOTE — Discharge Planning (Cosign Needed)
Discharge Summary   Patient ID: [name],  MRN: [id number], DOB/AGE: 1946-08-17 65 y.o.  Admit date: 09/14/2011 Discharge date: 09/15/2011  Discharge Diagnoses Principal Problem:  *CAD Active Problems:  Unspecified hypothyroidism  DIAB W/UNS COMP TYPE I [JUV] NOT STATED UNCNTRL  DYSLIPIDEMIA  HYPERTENSION, UNSPECIFIED  PERIPHERAL CIRCULATORY DISORDER  Aortic stenosis   Allergies Allergies  Allergen Reactions  . Adhesive (Tape) Other (See Comments)    "takes my skin off"  . Cephalexin Swelling and Rash  . Ciprofloxacin Swelling and Rash    Procedures None   History of Present Illness  65 yo female with PMHx significant for multiple issues including CAD, moderate AS (per ECHO 05/12), HL, hx of MI (1986) c/o progressive SOB, chest heaviness in neck and substernal area and DOE who was scheduled for elective repeat left and right cardiac catheterization on 11/30.   Hospital Course   Pt was unable to be admitted to hospital on 10/29. She was advised to come to Short Stay Center at [facility] for IV hydration and her catheterization was subsequently moved back. While at Short Stay, her Cr was found to be elevated at 1.38 and catheterization was deferred. She continued to hydrate well, however, BUN and Cr remained elevated. It was found that her BUN/Cr were WNL approx. 1 year ago and deemed high risk for IDDM. Pt will be discharged today, advised to follow PO hydration and heart healthy diet. Lasix will be held. Will plan for catheterization on 12/03.   Discharge Vitals:  Blood pressure 116/36, pulse 63, temperature 98.6 F (37 C), temperature source Oral, resp. rate 16, height 5' 3.5" (1.613 m), weight 80.65 kg (177 lb 12.8 oz), SpO2 96.00%.   Weight change:  I/O: +1139.7  Labs:   Lab Results  Component Value Date   WBC 6.0 09/15/2011   HGB 10.2* 09/15/2011   HCT 30.8* 09/15/2011   MCV 91.7 09/15/2011   PLT 150 09/15/2011    Lab 09/15/11 0500  NA 138  K 4.6  CL 106   CO2 24  BUN 33*  CREATININE 1.37*  CALCIUM 8.4  PROT --  BILITOT --  ALKPHOS --  ALT --  AST --  GLUCOSE 264*   Results for [name] (MRN [id number]) as of 09/15/2011 10:56  Ref. Range 09/07/2011 12:36 09/13/2011 11:08 09/14/2011 09:30 09/14/2011 13:48 09/15/2011 05:00  BUN Latest Range: 8-27 mg/dL 33 (H) 35 (H) 35 (H) 35 (H) 33 (H)  Creat Latest Range: 0.50-1.10 mg/dL 1.5 (H) 4.13 (H) 2.44 (H) 1.36 (H) 1.37 (H)  GFR calc non Af Amer Latest Range: >90 mL/min  35 (L) 39 (L) 40 (L) 39 (L)  GFR calc Af Amer Latest Range: >90 mL/min  40 (L) 45 (L) 46 (L) 45 (L)  Glucose Latest Range: 70-99 mg/dL 010 (H) 272 (H) 536 (H) 178 (H) 264 (H)  BUN/Creatinine Ratio Latest Range: 11-26   23     GFR Latest Range: >60.00 mL/min 37.48 (L)         Disposition:  Discharge Orders    Future Appointments: Provider: Department: Dept Phone: Center:   06/17/2012 2:00 PM [facility] [phone] [facility]   06/17/2012 2:30 PM [facility] [phone] [facility]   06/17/2012 3:15 PM Larina Earthly, MD [facility] [phone] [facility]     Follow-up Information    Follow up with [facility] on 09/17/2011. (At 6:30 AM for heart catheterization. )    Contact information:   [address]  [address] [place] [address]          Discharge Medications:   Current Discharge Medication List    START taking these medications   Details  nitroGLYCERIN (NITROSTAT) 0.4 MG SL tablet Place 1 tablet (0.4 mg total) under the tongue every 5 (five) minutes as needed for chest pain. Qty: 25 tablet, Refills: 3      CONTINUE these medications which have CHANGED   Details  furosemide (LASIX) 20 MG tablet Take 2 tabs in the am and take 1 tab in the pm Qty: 90 tablet, Refills: 6   Comments: Hold Lasix until after procedure Monday.      CONTINUE these medications which have NOT CHANGED   Details  aspirin EC 325 MG EC tablet Take 325 mg by mouth daily.        atorvastatin (LIPITOR) 40 MG tablet Take 1 tablet (40 mg total) by mouth at bedtime. Qty: 30 tablet, Refills: 11   Associated Diagnoses: Hyperlipidemia    brimonidine (ALPHAGAN) 0.2 % ophthalmic solution Place 1 drop into both eyes 2 (two) times daily.      Docusate Sodium (COLACE PO) Take 1 capsule by mouth daily as needed. For constipation    ezetimibe (ZETIA) 10 MG tablet Take 1 tablet (10 mg total) by mouth daily. Qty: 30 tablet, Refills: 6    insulin glargine (LANTUS) 100 UNIT/ML injection Inject 22 Units into the skin 2 (two) times daily. 22 in am, 10 units in pm    insulin lispro (HUMALOG PEN) 100 UNIT/ML injection Inject 0-9 Units into the skin 4 (four) times daily. Per sliding scale per patient    isosorbide mononitrate (IMDUR) 30 MG 24 hr tablet Take 1 tablet (30 mg total) by mouth daily. Qty: 30 tablet, Refills: 11    latanoprost (XALATAN) 0.005 % ophthalmic solution Place 1 drop into both eyes at bedtime.      levothyroxine (SYNTHROID, LEVOTHROID) 125 MCG tablet Take 125 mcg by mouth daily.      metoprolol succinate (TOPROL-XL) 25 MG 24 hr tablet Take 1 tablet (25 mg total) by mouth daily. Qty: 30 tablet, Refills: 6    quinapril (ACCUPRIL) 10 MG tablet Take 1 tablet (10 mg total) by mouth at bedtime. Qty: 30 tablet, Refills: 6        Outstanding Labs/Studies: None  Duration of Discharge Encounter: 30 minutes including physician time.  Signed, Hurman Horn, PA-C 09/15/2011, 10:14 AM

## 2011-09-15 NOTE — Progress Notes (Signed)
Patient ID: [name], female   DOB: Sep 15, 1946, 65 y.o.   MRN: [id number] Pt. Discharged 09/15/2011  12:37 PM Discharge instructions reviewed with patient/family. Patient/family verbalized understanding. All Rx's given. Questions answered as needed. Pt. Discharged to home with family/self. Taken off unit via W/C.Geri Seminole, RN

## 2011-09-15 NOTE — Progress Notes (Signed)
Subjective: Patient is not SOB at rest.  Feels puffy Objective: Filed Vitals:   09/14/11 1500 09/14/11 2300 09/15/11 0600 09/15/11 0700  BP: 147/80 140/65 116/36   Pulse: 61 60 63   Temp:  98 F (36.7 C) 98.6 F (37 C)   TempSrc:  Oral Oral   Resp:  18 16   Height:    5' 3.5" (1.613 m)  Weight:    177 lb 12.8 oz (80.65 kg)  SpO2:  98% 96%    Weight change:   Intake/Output Summary (Last 24 hours) at 09/15/11 0930 Last data filed at 09/15/11 0700  Gross per 24 hour  Intake 1136.67 ml  Output      0 ml  Net 1136.67 ml    General: Alert, awake, oriented x3, in no acute distress.   Heart: Regular rate and rhythm, Gr III/VI sys murmru at base Lungs: CTA Ext:  ++ edema   Lab Results: Results for orders placed during the hospital encounter of 09/14/11 (from the past 24 hour(s))  GLUCOSE, CAPILLARY     Status: Abnormal   Collection Time   09/14/11 11:15 AM      Component Value Range   Glucose-Capillary 210 (*) 70 - 99 (mg/dL)  BASIC METABOLIC PANEL     Status: Abnormal   Collection Time   09/14/11  1:48 PM      Component Value Range   Sodium 138  135 - 145 (mEq/L)   Potassium 4.4  3.5 - 5.1 (mEq/L)   Chloride 106  96 - 112 (mEq/L)   CO2 27  19 - 32 (mEq/L)   Glucose, Bld 178 (*) 70 - 99 (mg/dL)   BUN 35 (*) 6 - 23 (mg/dL)   Creatinine, Ser 1.61 (*) 0.50 - 1.10 (mg/dL)   Calcium 8.7  8.4 - 09.6 (mg/dL)   GFR calc non Af Amer 40 (*) >90 (mL/min)   GFR calc Af Amer 46 (*) >90 (mL/min)  GLUCOSE, CAPILLARY     Status: Abnormal   Collection Time   09/14/11  2:07 PM      Component Value Range   Glucose-Capillary 171 (*) 70 - 99 (mg/dL)  GLUCOSE, CAPILLARY     Status: Abnormal   Collection Time   09/14/11  4:42 PM      Component Value Range   Glucose-Capillary 144 (*) 70 - 99 (mg/dL)   Comment 1 Notify RN    GLUCOSE, CAPILLARY     Status: Abnormal   Collection Time   09/14/11  9:37 PM      Component Value Range   Glucose-Capillary 200 (*) 70 - 99 (mg/dL)  CBC      Status: Abnormal   Collection Time   09/15/11  5:00 AM      Component Value Range   WBC 6.0  4.0 - 10.5 (K/uL)   RBC 3.36 (*) 3.87 - 5.11 (MIL/uL)   Hemoglobin 10.2 (*) 12.0 - 15.0 (g/dL)   HCT 04.5 (*) 40.9 - 46.0 (%)   MCV 91.7  78.0 - 100.0 (fL)   MCH 30.4  26.0 - 34.0 (pg)   MCHC 33.1  30.0 - 36.0 (g/dL)   RDW 81.1  91.4 - 78.2 (%)   Platelets 150  150 - 400 (K/uL)  BASIC METABOLIC PANEL     Status: Abnormal   Collection Time   09/15/11  5:00 AM      Component Value Range   Sodium 138  135 - 145 (mEq/L)   Potassium 4.6  3.5 - 5.1 (mEq/L)   Chloride 106  96 - 112 (mEq/L)   CO2 24  19 - 32 (mEq/L)   Glucose, Bld 264 (*) 70 - 99 (mg/dL)   BUN 33 (*) 6 - 23 (mg/dL)   Creatinine, Ser 4.09 (*) 0.50 - 1.10 (mg/dL)   Calcium 8.4  8.4 - 81.1 (mg/dL)   GFR calc non Af Amer 39 (*) >90 (mL/min)   GFR calc Af Amer 45 (*) >90 (mL/min)  GLUCOSE, CAPILLARY     Status: Abnormal   Collection Time   09/15/11  7:40 AM      Component Value Range   Glucose-Capillary 282 (*) 70 - 99 (mg/dL)   Comment 1 Notify RN      Studies/Results: No results found.  Medications: I have reviewed the patient's current medications.   Patient Active Hospital Problem List: CAD (02/03/2009)   Assessment: Plan for cath on Monday.   Continue to hold lasix. DIAB W/UNS COMP TYPE I [JUV] NOT STATED UNCNTRL (02/03/2009)   Assessment: Continue current meds.   Plan: DYSLIPIDEMIA (02/01/2009)   Assessment: Continue meds.   Plan: HYPERTENSION, UNSPECIFIED (02/01/2009)   Assessment: Fair control  Hold lasix for now.   Plan: Aortic stenpsos/ (08/22/2011)   Assessment: Moderate on last echo.   Plan: Renal insufficiency:  Cr and GFR rel stable.  Hold lasix  Encourage PO fluids.   LOS: 1 day   Dietrich Pates 09/15/2011, 9:30 AM

## 2011-09-17 ENCOUNTER — Ambulatory Visit (HOSPITAL_COMMUNITY)
Admission: RE | Admit: 2011-09-17 | Discharge: 2011-09-18 | Disposition: A | Discharge status: Home / Self Care | Payer: Medicare Other | Source: Ambulatory Visit | Attending: Cardiology | Admitting: Cardiology

## 2011-09-17 ENCOUNTER — Encounter (HOSPITAL_COMMUNITY): Payer: Self-pay | Admitting: Cardiology

## 2011-09-17 ENCOUNTER — Encounter (HOSPITAL_COMMUNITY): Payer: Self-pay

## 2011-09-17 ENCOUNTER — Ambulatory Visit (HOSPITAL_COMMUNITY): Admit: 2011-09-17 | Payer: Self-pay | Admitting: Cardiology

## 2011-09-17 ENCOUNTER — Encounter (HOSPITAL_COMMUNITY)
Admission: RE | Disposition: A | Discharge status: Home / Self Care | Payer: Self-pay | Source: Ambulatory Visit | Attending: Cardiology

## 2011-09-17 DIAGNOSIS — I25810 Atherosclerosis of coronary artery bypass graft(s) without angina pectoris: Secondary | ICD-10-CM | POA: Insufficient documentation

## 2011-09-17 DIAGNOSIS — I359 Nonrheumatic aortic valve disorder, unspecified: Secondary | ICD-10-CM | POA: Insufficient documentation

## 2011-09-17 DIAGNOSIS — I2510 Atherosclerotic heart disease of native coronary artery without angina pectoris: Secondary | ICD-10-CM

## 2011-09-17 DIAGNOSIS — R0602 Shortness of breath: Secondary | ICD-10-CM | POA: Insufficient documentation

## 2011-09-17 DIAGNOSIS — E039 Hypothyroidism, unspecified: Secondary | ICD-10-CM | POA: Insufficient documentation

## 2011-09-17 DIAGNOSIS — I10 Essential (primary) hypertension: Secondary | ICD-10-CM | POA: Insufficient documentation

## 2011-09-17 DIAGNOSIS — E109 Type 1 diabetes mellitus without complications: Secondary | ICD-10-CM | POA: Insufficient documentation

## 2011-09-17 DIAGNOSIS — E785 Hyperlipidemia, unspecified: Secondary | ICD-10-CM | POA: Insufficient documentation

## 2011-09-17 DIAGNOSIS — I350 Nonrheumatic aortic (valve) stenosis: Secondary | ICD-10-CM | POA: Insufficient documentation

## 2011-09-17 DIAGNOSIS — I519 Heart disease, unspecified: Secondary | ICD-10-CM | POA: Insufficient documentation

## 2011-09-17 HISTORY — PX: LEFT AND RIGHT HEART CATHETERIZATION WITH CORONARY ANGIOGRAM: SHX5449

## 2011-09-17 LAB — POCT I-STAT 3, ART BLOOD GAS (G3+)
Acid-base deficit: 1 mmol/L (ref 0.0–2.0)
O2 Saturation: 99 %
pO2, Arterial: 134 mmHg — ABNORMAL HIGH (ref 80.0–100.0)

## 2011-09-17 LAB — POCT I-STAT 3, VENOUS BLOOD GAS (G3P V)
Acid-base deficit: 1 mmol/L (ref 0.0–2.0)
Bicarbonate: 24.1 mEq/L — ABNORMAL HIGH (ref 20.0–24.0)
Bicarbonate: 25.1 mEq/L — ABNORMAL HIGH (ref 20.0–24.0)
Bicarbonate: 25.1 mEq/L — ABNORMAL HIGH (ref 20.0–24.0)
O2 Saturation: 56 %
O2 Saturation: 64 %
TCO2: 27 mmol/L (ref 0–100)
TCO2: 27 mmol/L (ref 0–100)
pCO2, Ven: 47 mmHg (ref 45.0–50.0)
pH, Ven: 7.308 — ABNORMAL HIGH (ref 7.250–7.300)
pO2, Ven: 31 mmHg (ref 30.0–45.0)
pO2, Ven: 35 mmHg (ref 30.0–45.0)

## 2011-09-17 LAB — GLUCOSE, CAPILLARY
Glucose-Capillary: 134 mg/dL — ABNORMAL HIGH (ref 70–99)
Glucose-Capillary: 104 mg/dL — ABNORMAL HIGH (ref 70–99)
Glucose-Capillary: 147 mg/dL — ABNORMAL HIGH (ref 70–99)
Glucose-Capillary: 150 mg/dL — ABNORMAL HIGH (ref 70–99)

## 2011-09-17 LAB — BASIC METABOLIC PANEL
BUN: 28 mg/dL — ABNORMAL HIGH (ref 6–23)
Chloride: 107 mEq/L (ref 96–112)
Creatinine, Ser: 1.2 mg/dL — ABNORMAL HIGH (ref 0.50–1.10)
GFR calc Af Amer: 53 mL/min — ABNORMAL LOW (ref >90)

## 2011-09-17 SURGERY — LEFT AND RIGHT HEART CATHETERIZATION WITH CORONARY/GRAFT ANGIOGRAM
Anesthesia: LOCAL

## 2011-09-17 SURGERY — LEFT AND RIGHT HEART CATHETERIZATION WITH CORONARY ANGIOGRAM
Anesthesia: LOCAL

## 2011-09-17 MED ORDER — ROSUVASTATIN CALCIUM 20 MG PO TABS
20.0000 mg | ORAL_TABLET | Freq: Every day | ORAL | Status: DC
  Administered 2011-09-17: 20 mg via ORAL
  Filled 2011-09-17 (×2): qty 1

## 2011-09-17 MED ORDER — INSULIN GLARGINE 100 UNIT/ML ~~LOC~~ SOLN
10.0000 [IU] | Freq: Every day | SUBCUTANEOUS | Status: DC
  Administered 2011-09-17: 10 [IU] via SUBCUTANEOUS

## 2011-09-17 MED ORDER — ASPIRIN 81 MG PO CHEW
81.0000 mg | CHEWABLE_TABLET | Freq: Every day | ORAL | Status: DC
  Administered 2011-09-18: 81 mg via ORAL
  Filled 2011-09-17: qty 1

## 2011-09-17 MED ORDER — ASPIRIN 81 MG PO CHEW
324.0000 mg | CHEWABLE_TABLET | ORAL | Status: AC
  Administered 2011-09-17: 324 mg via ORAL
  Filled 2011-09-17: qty 4

## 2011-09-17 MED ORDER — HEPARIN (PORCINE) IN NACL 2-0.9 UNIT/ML-% IJ SOLN
INTRAMUSCULAR | Status: AC
  Filled 2011-09-17: qty 2000

## 2011-09-17 MED ORDER — INSULIN GLARGINE 100 UNIT/ML ~~LOC~~ SOLN
0.0000 [IU] | Freq: Two times a day (BID) | SUBCUTANEOUS | Status: DC
  Filled 2011-09-17: qty 3

## 2011-09-17 MED ORDER — LISINOPRIL 10 MG PO TABS
10.0000 mg | ORAL_TABLET | Freq: Every day | ORAL | Status: DC
  Administered 2011-09-17: 10 mg via ORAL
  Filled 2011-09-17 (×2): qty 1

## 2011-09-17 MED ORDER — MORPHINE SULFATE 2 MG/ML IJ SOLN: 2.0000 mg | INTRAMUSCULAR | Status: DC | PRN

## 2011-09-17 MED ORDER — CHLORHEXIDINE GLUCONATE CLOTH 2 % EX PADS: 6.0000 | MEDICATED_PAD | Freq: Every day | CUTANEOUS | Status: DC

## 2011-09-17 MED ORDER — FUROSEMIDE 40 MG PO TABS
40.0000 mg | ORAL_TABLET | Freq: Every day | ORAL | Status: DC
  Administered 2011-09-18: 40 mg via ORAL
  Filled 2011-09-17: qty 1

## 2011-09-17 MED ORDER — NITROGLYCERIN 0.4 MG SL SUBL: 0.4000 mg | SUBLINGUAL_TABLET | SUBLINGUAL | Status: DC | PRN

## 2011-09-17 MED ORDER — BRIMONIDINE TARTRATE 0.2 % OP SOLN
1.0000 [drp] | Freq: Two times a day (BID) | OPHTHALMIC | Status: DC
  Administered 2011-09-17 – 2011-09-18 (×2): 1 [drp] via OPHTHALMIC
  Filled 2011-09-17: qty 5

## 2011-09-17 MED ORDER — NITROGLYCERIN 0.2 MG/ML ON CALL CATH LAB
INTRAVENOUS | Status: AC
  Filled 2011-09-17: qty 1

## 2011-09-17 MED ORDER — SODIUM CHLORIDE 0.9 % IV SOLN
INTRAVENOUS | Status: DC
  Administered 2011-09-17: 07:00:00 via INTRAVENOUS

## 2011-09-17 MED ORDER — ISOSORBIDE MONONITRATE ER 30 MG PO TB24
30.0000 mg | ORAL_TABLET | Freq: Every day | ORAL | Status: DC
  Administered 2011-09-17: 30 mg via ORAL
  Filled 2011-09-17 (×2): qty 1

## 2011-09-17 MED ORDER — LIDOCAINE HCL (PF) 1 % IJ SOLN
INTRAMUSCULAR | Status: AC
  Filled 2011-09-17: qty 30

## 2011-09-17 MED ORDER — SODIUM CHLORIDE 0.9 % IV SOLN
INTRAVENOUS | Status: AC
  Administered 2011-09-17: 14:00:00 via INTRAVENOUS

## 2011-09-17 MED ORDER — LATANOPROST 0.005 % OP SOLN
1.0000 [drp] | Freq: Every day | OPHTHALMIC | Status: DC
  Administered 2011-09-17: 1 [drp] via OPHTHALMIC
  Filled 2011-09-17: qty 2.5

## 2011-09-17 MED ORDER — SODIUM CHLORIDE 0.9 % IJ SOLN: 3.0000 mL | INTRAMUSCULAR | Status: DC | PRN

## 2011-09-17 MED ORDER — LEVOTHYROXINE SODIUM 125 MCG PO TABS
125.0000 ug | ORAL_TABLET | Freq: Every day | ORAL | Status: DC
  Administered 2011-09-18: 125 ug via ORAL
  Filled 2011-09-17 (×2): qty 1

## 2011-09-17 MED ORDER — EZETIMIBE 10 MG PO TABS
10.0000 mg | ORAL_TABLET | Freq: Every day | ORAL | Status: DC
  Administered 2011-09-17: 10 mg via ORAL
  Filled 2011-09-17 (×2): qty 1

## 2011-09-17 MED ORDER — SODIUM CHLORIDE 0.9 % IJ SOLN: 3.0000 mL | Freq: Two times a day (BID) | INTRAMUSCULAR | Status: DC

## 2011-09-17 MED ORDER — METOPROLOL SUCCINATE ER 25 MG PO TB24
25.0000 mg | ORAL_TABLET | Freq: Every day | ORAL | Status: DC
  Administered 2011-09-18: 25 mg via ORAL
  Filled 2011-09-17 (×2): qty 1

## 2011-09-17 MED ORDER — INSULIN GLARGINE 100 UNIT/ML ~~LOC~~ SOLN
22.0000 [IU] | Freq: Every day | SUBCUTANEOUS | Status: DC
  Administered 2011-09-18: 22 [IU] via SUBCUTANEOUS

## 2011-09-17 MED ORDER — MIDAZOLAM HCL 2 MG/2ML IJ SOLN
INTRAMUSCULAR | Status: AC
  Filled 2011-09-17: qty 2

## 2011-09-17 MED ORDER — ONDANSETRON HCL 4 MG/2ML IJ SOLN
4.0000 mg | Freq: Four times a day (QID) | INTRAMUSCULAR | Status: DC | PRN
  Administered 2011-09-17 (×2): 4 mg via INTRAVENOUS
  Filled 2011-09-17 (×2): qty 2

## 2011-09-17 MED ORDER — MORPHINE SULFATE 10 MG/ML IJ SOLN
INTRAMUSCULAR | Status: AC
  Filled 2011-09-17: qty 1

## 2011-09-17 MED ORDER — MORPHINE SULFATE 4 MG/ML IJ SOLN
INTRAMUSCULAR | Status: AC
  Filled 2011-09-17: qty 1

## 2011-09-17 MED ORDER — MUPIROCIN 2 % EX OINT
1.0000 "application " | TOPICAL_OINTMENT | Freq: Two times a day (BID) | CUTANEOUS | Status: DC
  Administered 2011-09-17: 1 via NASAL
  Filled 2011-09-17: qty 22

## 2011-09-17 NOTE — Progress Notes (Signed)
Patient ID: [name], female   DOB: 06-11-1946, 65 y.o.   MRN: [id number] Case discussed with patient in detail.  She has AS, diastolic dysfunction, and decrease activity.  She also has some progression in the prox CFX.  It is unclear if a PCI would change her symptoms, but it would be of increased risk due to AS, renal insufficiency, and co-morbidities.  I will plan to send home in am, renew diuresis, and check BMET.  I will review with [name] the findings.    Groin stable.  Glucose about 100.  Insulin discussed with patient.    [name]. 09/17/2011. 6:13 PM

## 2011-09-17 NOTE — H&P (View-Only) (Signed)
[name] Date of Birth: 1946/06/22 Medical Record #[id number]  History of Present Illness: [name] is seen back today for a follow up visit. She is subsequently seen with [name]. She has multiple medical issues which included known CAD with remote CABG in 1992/93, PVD with remote partial foot amputation & prior left femoral to anterior tibial bypass grafting in 2010, brittle diabetes and aortic stenosis. She has had progressive shortness of breath with some chest heaviness in the neck and substernal area. We started her on low dose nitrate therapy and increased her diuretics. D dimer was slightly elevated and she underwent CT scan which was negative for PE. CXR showed fluid. While her edema has improved, her shortness of breath has not. She continues to have DOE with just minimal activities. Continues to have the chest heaviness as well. She reports that she is "just miserable". She is now referred on for repeat left and right heart catheterization.   Current Outpatient Prescriptions on File Prior to Visit  Medication Sig Dispense Refill  . aspirin EC 325 MG EC tablet Take 325 mg by mouth daily.        Tery Sanfilippo Sodium (COLACE PO) Take by mouth 2 (two) times daily as needed.       . ezetimibe (ZETIA) 10 MG tablet Take 1 tablet (10 mg total) by mouth daily.  30 tablet  6  . furosemide (LASIX) 20 MG tablet Take 2 tabs in the am and take 1 tab in the pm  90 tablet  6  . insulin glargine (LANTUS) 100 UNIT/ML injection Inject 20 Units into the skin 2 (two) times daily. 20 in am, 10 units in pm      . insulin lispro (HUMALOG PEN) 100 UNIT/ML injection Use as directed       . isosorbide mononitrate (IMDUR) 30 MG 24 hr tablet Take 1 tablet (30 mg total) by mouth daily.  30 tablet  11  . latanoprost (XALATAN) 0.005 % ophthalmic solution Place 1 drop into both eyes at bedtime.        [name] levothyroxine (SYNTHROID, LEVOTHROID) 125 MCG tablet Take 125 mcg by mouth daily.        . metoprolol  succinate (TOPROL-XL) 25 MG 24 hr tablet Take 1 tablet (25 mg total) by mouth daily.  30 tablet  6  . quinapril (ACCUPRIL) 10 MG tablet Take 1 tablet (10 mg total) by mouth at bedtime.  30 tablet  6  . DISCONTD: atorvastatin (LIPITOR) 40 MG tablet Take 40 mg by mouth at bedtime.          Allergies  Allergen Reactions  . Adhesive (Tape)   . Cephalexin   . Ciprofloxacin     Past Medical History  Diagnosis Date  . HTN (hypertension)   . Hypothyroidism   . Diabetes mellitus, type 2     insulin dependent  . History of recurrent UTIs   . CAD (coronary artery disease)     s/p CABG x3 in 1993; last cath in 2010  . Scleroderma   . Bilateral carpal tunnel syndrome 1970s  . 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 Followed by [name]  . Arthritis   . Anemia   . Claudication   . Aortic stenosis     Moderate per echo in May 2012  . Hyperlipidemia     on Lipitor  . Myocardial infarction 1986  .  Seizure     ? seizure like event in 2010; workup included stress testing which led to repeat cath.   . Cat bite 2009    with MRSA; required I & D    Past Surgical History  Procedure Date  . Carpal tunnel release   . 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 Vitrectomy   . Tubal ligation   . Tonsillectomy   . Coronary artery bypass graft 1993    LIMA to LAD, SVG to distal LCX & SVG to Marginal  . Appendectomy   . Amputation     left first and second toes  . Cardiac catheterization 2010    LIMA to LAD patent yet with occluded distal LAD after graft insertion & retrograde is occluded. 3-4 septal perforators arise &are patent. SVG to a large marginal patent; SVG presumably to the distal dominant LCX is occluded, moderately high grade stenosis of the AV circumflex, but with distal stenoses involving a severely diseased marginal branch not amenable  to PCI  nor is inferior branch     History  Smoking status  . Never Smoker   Smokeless tobacco    . Not on file    History  Alcohol Use No    Family History  Problem Relation Age of Onset  . Diabetes Mother     Review of Systems: The review of systems is positive for DOE and chest heaviness. Her gait remains unsteady from prior amputation. No falls. No fever or chills. No syncope reported. Not really lightheaded or dizzy.  All other systems were reviewed and are negative.  Physical Exam: BP 138/60  Pulse 62  Ht 5' 3.5" (1.613 m)  Wt 177 lb 12.8 oz (80.65 kg)  BMI 31.00 kg/m2 Patient is pleasant and in no acute distress. Does not appear as "puffy" today. Skin is warm and dry. Color is normal.  HEENT is unremarkable. Normocephalic/atraumatic. PERRL. Sclera are nonicteric. Neck is supple. No masses. No JVD. Lungs are clear. Cardiac exam shows a regular rate and rhythm. She has a high pitched outflow murmur. Abdomen is soft. Extremities are without edema. Pulses are diminished. Legs are very dry and scaly. Gait and ROM are intact. No gross neurologic deficits noted.  LABORATORY DATA: PENDING      Assessment / Plan:

## 2011-09-17 NOTE — Op Note (Signed)
Cardiac Catheterization Procedure Note  Name: [name] MRN: [id number] DOB: October 14, 1946  Procedure: Right Heart Cath, Left Heart Cath, Selective Coronary Angiography, LV angiography  Indication:    Procedural Details: The right groin was prepped, draped, and anesthetized with 1% lidocaine. Using the modified Seldinger technique a 5 French sheath was placed in the right femoral artery and a 7 French sheath was placed in the right femoral vein. A Swan-Ganz catheter was used for the right heart catheterization. Standard protocol was followed for recording of right heart pressures and sampling of oxygen saturations. Fick cardiac output was calculated. Standard Judkins catheters were used for selective coronary angiography and left ventriculography. There were no immediate procedural complications. The patient was transferred to the post catheterization recovery area for further monitoring.  We used a smart needle to gain access to the femorals.  There were no complications.  She was taken to the holding area in no distress.  Procedural Findings: Hemodynamics RA 19/17 (15) RV 61/16 PA 59/26 (40) PCWP 31 LV 190/29 AO 170/65 (102) Peak to peak varied from 6-31mmHg  Oxygen saturations: SVC 53% PA 56% AO 99%  Cardiac Output (Fick) 3.88 L/min  Cardiac Index (Fick) 2.1 L/min/m2 Cardiac Output  (Thermo)  2.96 L/Min Cardiac Index 1.59 L/min/M2   Coronary angiography: Coronary dominance: left  Left mainstem: The left main coronary artery demonstrates no significant obstruction.  Left anterior descending (LAD): The left anterior descending artery is subtotally occluded beyond the origin of a small diagonal branch more proximally there is about 50% segmental plaquing proximally. The distal vessel was filled by an internal mammary graft.  The internal mammary graft is widely patent it inserts into the midportion of the left anterior descending artery. The left anterior descending artery  fills retrograde which supplies at least 3 septal perforators and a large diagonal system the distal left anterior descending artery after the origin of another small diagonal branch is basically totally occluded and the and the apical vessel appears severely diseased.  Left circumflex (LCx):  Circumflex vessel was a dominant vessel. There is a proximal lesion of approximately 75-80% which is progress from the previous catheterization of 2010 the midportion of the dominant circumflex is widely patent. There is an obvious missing circumflex branch compatible with a known graft that is patent. There is a marginal branch distally that is a sickly subtotally occluded this is followed by a moderate size posterolateral branch that has no significant obstruction this is followed by a posterior descending branch is subtotally occluded the distal remnants of the previously inserted vein graft into the distal circumflex is evident.  The saphenous vein graft to the distal circumflex is essentially occluded.  There is a widely patent saphenous vein graft that inserts into an obtuse marginal branch and fills appears to be some other branches by retrograde collaterals. It is a relatively large territory of the vessel.  Right coronary artery (RCA): The right coronary artery is a nondominant vessel it supplies 2 right ventricular branches. There is a 90-95% stenosis noted in the midportion of the of the artery is relatively small caliber vessel.  Left ventriculography: Ventriculography was not performed because of the need to conserve contrast.  Final Conclusions:   1. Elevated right heart pressures consistent with diastolic dysfunction. 2. Continued patency of the internal mammary artery to the distal left anterior descending artery with occlusion of the distal LAD as noted on previous studies. 3. High grade stenosis of a nondominant right coronary artery. 4. Progression of  disease in the proximal dominant  circumflex compared to the previous study of 2010. 5. Occlusion of the saphenous vein graft to the posterior descending branch which is derived from the circumflex vessel with high-grade stenosis of the circumflex portion of the PDA-this is chronic. 6. Continued patency of the saphenous vein graft to a obtuse marginal branch, similar to 2010.   Recommendations:  1. We'll admit the patient overnight for gentle hydration and reexamination of her renal function. 2. I reviewed the films with [name] before making a final determination. She is not a good candidate for redo surgery, particularly revascularization as little of the accomplished at this point. 3. Aortic stenosis is moderate, at least by cath does not appear to be critical.   [name], MD, [facility], [facility] 09/17/2011, 11:48 AM

## 2011-09-17 NOTE — Procedures (Signed)
2mg  morphine iv for shoulder pain per order Dr Ermalene Postin at 1200

## 2011-09-17 NOTE — Interval H&P Note (Signed)
History and Physical Interval Note:  09/17/2011 9:57 AM  [name]  has presented today for surgery, with the diagnosis of chest pain  The various methods of treatment have been discussed with the patient and family. After consideration of risks, benefits and other options for treatment, the patient has consented to  Procedure(s): LEFT AND RIGHT HEART CATHETERIZATION WITH CORONARY ANGIOGRAM as a surgical intervention .  The patients' history has been reviewed, patient examined, no change in status, stable for surgery.  I have reviewed the patients' chart and labs.  Questions were answered to the patient's satisfaction.  The patient was admitted by [name], and underwent IV fluid administration.  Her Cr remained elevated.  She was given IV fluid until Saturday, sent home by [name], and brought back today for cath procedure.  R and L heart cath have been recommended.  I have reviewed the old films in detail.  Her Cr is now down to 1.2.  Her old report is reviewed.  Her echo is also reviewed.  Limited views will be obtained.   Risks and benefits have been explained, and she consents to proceed.      [name] 09/17/2011 9:59 AM

## 2011-09-18 DIAGNOSIS — I2510 Atherosclerotic heart disease of native coronary artery without angina pectoris: Secondary | ICD-10-CM

## 2011-09-18 LAB — BASIC METABOLIC PANEL
CO2: 25 mEq/L (ref 19–32)
Calcium: 8.6 mg/dL (ref 8.4–10.5)
GFR calc non Af Amer: 41 mL/min — ABNORMAL LOW (ref >90)
Potassium: 5.2 mEq/L — ABNORMAL HIGH (ref 3.5–5.1)
Sodium: 137 mEq/L (ref 135–145)

## 2011-09-18 LAB — CBC
Hemoglobin: 10.5 g/dL — ABNORMAL LOW (ref 12.0–15.0)
Platelets: 175 10*3/uL (ref 150–400)
RBC: 3.46 MIL/uL — ABNORMAL LOW (ref 3.87–5.11)
WBC: 6.1 10*3/uL (ref 4.0–10.5)

## 2011-09-18 MED ORDER — FUROSEMIDE 20 MG PO TABS: 40.0000 mg | ORAL_TABLET | Freq: Every day | ORAL | Status: DC

## 2011-09-18 MED ORDER — INSULIN ASPART 100 UNIT/ML ~~LOC~~ SOLN
0.0000 [IU] | Freq: Three times a day (TID) | SUBCUTANEOUS | Status: DC
  Filled 2011-09-18: qty 3

## 2011-09-18 NOTE — Progress Notes (Signed)
Called by RN to speak to pt who has concerns about her DM care in the hospital. Spoke with pt about her concerns.  Answered all her questions.  Allowed pt to express all of her concerns and complaints.  Pt told me the RN gave her the Lantus and Novolog pens that we used with her during her hospitalization to take home.  Pt does not currently have a Rx for either of these insulin pens.  Spoke with pt's RN, Shirlee Limerick.  Asked RN to please call the MD to get Rxs for these two pens so that pt can take them home.  Instructed pt on how to safely use these insulin pens.  Showed pt how to identify the difference between the Lantus and Novolog pens.  Pt will need to call her Endo, Dr. Talmage Nap, and get a Rx for insulin pen needles so she can use her insulin pens at home.

## 2011-09-18 NOTE — Progress Notes (Signed)
Subjective:  Stable at present.  No chest pain.  Cr stable.  Home this am, and will assess her situation with my partners.  Wants to go home.  Objective:  Vital Signs in the last 24 hours: Temp:  [96 F (35.6 C)-97.6 F (36.4 C)] 97.6 F (36.4 C) (12/04 0441) Pulse Rate:  [62-68] 68  (12/04 0441) Resp:  [13-18] 18  (12/04 0018) BP: (118-149)/(43-62) 129/50 mmHg (12/04 0441) SpO2:  [98 %] 98 % (12/03 1330) Weight:  [81.6 kg (179 lb 14.3 oz)] 179 lb 14.3 oz (81.6 kg) (12/04 0441)  Intake/Output from previous day: 12/03 0701 - 12/04 0700 In: 450 [I.V.:450] Out: 1250 [Urine:700; Emesis/NG output:550]   Physical Exam: General: Well developed, well nourished, in no acute distress. Head:  Normocephalic and atraumatic. Lungs: Clear to auscultation and percussion. Heart: SEM.  No DM.   Pulses: Pulses normal in all 4 extremities. Extremities: No clubbing or cyanosis. No edema. Neurologic: Alert and oriented x 3.    Lab Results:  [facility] 09/18/11 0415  WBC 6.1  HGB 10.5*  PLT 175    Basename 09/18/11 0415 09/17/11 0705  NA 137 141  K 5.2* 4.4  CL 106 107  CO2 25 24  GLUCOSE 328* 180*  BUN 27* 28*  CREATININE 1.32* 1.20*   No results found for this basename: TROPONINI:2,CK,MB:2 in the last 72 hours Hepatic Function Panel No results found for this basename: PROT,ALBUMIN,AST,ALT,ALKPHOS,BILITOT,BILIDIR,IBILI in the last 72 hours No results found for this basename: CHOL in the last 72 hours No results found for this basename: PROTIME in the last 72 hours  Imaging: No results found.    Assessment/Plan:  Reviewed in detail.  Tolerated cath.  Glucoses are elevated and she needs coverage.  Lantus restart last night, but was hypoglycemic last pm.   Will dc with early follow up with [name].   BMET later this week in the office. Resume furosemide at only 40mg  daily for now, and assess response.  Consider PCI of CFX, but doubt that is clearly responsible for her symptoms.   With AS, diastolic dysfunction, and lack of activity, there are clearly other causes.         [name], MD, [facility], [facility] 09/18/2011, 8:52 AM

## 2011-09-18 NOTE — Progress Notes (Signed)
Pt refuses bactroban and chg wipes for positive mrsa swab. Adria Dill RN

## 2011-09-18 NOTE — Discharge Summary (Signed)
Patient ID: [name],  MRN: [id number], DOB/AGE: 1946-04-24 65 y.o.  Admit date: 09/17/2011 Discharge date: 09/18/2011   Primary Cardiologist:   Dietrich Pates  Discharge Diagnoses Principal Problem:  *CAD Active Problems:  Unspecified hypothyroidism  DIAB W/UNS COMP TYPE I [JUV] NOT STATED UNCNTRL  DYSLIPIDEMIA  HYPERTENSION, UNSPECIFIED  Aortic stenosis   Allergies Allergies  Allergen Reactions  . Adhesive (Tape) Other (See Comments)    "takes my skin off"  . Cephalexin Swelling and Rash  . Ciprofloxacin Swelling and Rash    Procedures  12.3.2012 - Cardiac Cath Procedural Findings:  Hemodynamics  RA 19/17 (15)  RV 61/16  PA 59/26 (40)  PCWP 31  LV 190/29  AO 170/65 (102)  Peak to peak varied from 6-32mmHg  Oxygen saturations:  SVC 53%  PA 56%  AO 99%  Cardiac Output (Fick) 3.88 L/min  Cardiac Index (Fick) 2.1 L/min/m2  Cardiac Output (Thermo) 2.96 L/Min  Cardiac Index 1.59 L/min/M2  Coronary angiography:  Coronary dominance: left  Left mainstem: The left main coronary artery demonstrates no significant obstruction.  Left anterior descending (LAD): The left anterior descending artery is subtotally occluded beyond the origin of a small diagonal branch more proximally there is about 50% segmental plaquing proximally. The distal vessel was filled by an internal mammary graft.  The internal mammary graft is widely patent it inserts into the midportion of the left anterior descending artery. The left anterior descending artery fills retrograde which supplies at least 3 septal perforators and a large diagonal system the distal left anterior descending artery after the origin of another small diagonal branch is basically totally occluded and the and the apical vessel appears severely diseased.  Left circumflex (LCx): Circumflex vessel was a dominant vessel. There is a proximal lesion of approximately 75-80% which is progress from the previous catheterization of 2010  the midportion of the dominant circumflex is widely patent. There is an obvious missing circumflex branch compatible with a known graft that is patent. There is a marginal branch distally that is a sickly subtotally occluded this is followed by a moderate size posterolateral branch that has no significant obstruction this is followed by a posterior descending branch is subtotally occluded the distal remnants of the previously inserted vein graft into the distal circumflex is evident.  The saphenous vein graft to the distal circumflex is essentially occluded.  There is a widely patent saphenous vein graft that inserts into an obtuse marginal branch and fills appears to be some other branches by retrograde collaterals. It is a relatively large territory of the vessel.  Right coronary artery (RCA): The right coronary artery is a nondominant vessel it supplies 2 right ventricular branches. There is a 90-95% stenosis noted in the midportion of the of the artery is relatively small caliber vessel.  Left ventriculography: Ventriculography was not performed because of the need to conserve contrast. **Medical Therapy Recommended  History of Present Illness  65 y/o female with the above problem list.  She was recently seen in the office with complaints of chest discomfort and decision was made to pursue right and left heart cardiac catheterization.  Pt initially presented for this procedure on 11/30, however, her creatinine was elevated and her acei and diuretic were held, she was hydrated, and subsequently d/c on 12/1 with plan for her to return on 12/3 for cath.  Hospital Course  Pt presented to the [facility] cath lab.  Her creatinine was stable.  Diagnostic catheterization was performed revealing significant multivessel dzs  as outlined above with elevated right heart pressures.  It was felt that the left circumflex could be a potential target for PCI however it is not clear that this is the culprit for her symptoms.   Medical therapy has been recommended.  Post-cath, pt has had no c/p or doe.  She will be d/c today in good condition.  Discharge Vitals:  Blood pressure 129/50, pulse 68, temperature 97.6 F (36.4 C), temperature source Oral, resp. rate 18, height 5' 3.5" (1.613 m), weight 81.6 kg (179 lb 14.3 oz), SpO2 98.00%.    Labs: CBC:  Basename 09/18/11 0415  WBC 6.1  NEUTROABS --  HGB 10.5*  HCT 31.7*  MCV 91.6  PLT 175   Basic Metabolic Panel:  Basename 09/18/11 0415 09/17/11 0705  NA 137 141  K 5.2* 4.4  CL 106 107  CO2 25 24  GLUCOSE 328* 180*  BUN 27* 28*  CREATININE 1.32* 1.20*  CALCIUM 8.6 9.3  MG -- --  PHOS -- --   Disposition:  Follow-up Information    Follow up with Tereso Newcomer, PA on 10/04/2011. (9:30)    Contact information:   [address] [place] [address] [phone]       Follow up with [facility] on 09/25/2011. (blood chemistry - anytime after 8:30)    Contact information:   [address] [place] [address] [phone]         Discharge Medications: Current Discharge Medication List    CONTINUE these medications which have CHANGED   Details  furosemide (LASIX) 20 MG tablet Take 2 tablets (40 mg total) by mouth daily.      CONTINUE these medications which have NOT CHANGED   Details  aspirin EC 325 MG EC tablet Take 325 mg by mouth daily.      atorvastatin (LIPITOR) 40 MG tablet Take 1 tablet (40 mg total) by mouth at bedtime. Qty: 30 tablet, Refills: 11   Associated Diagnoses: Hyperlipidemia    brimonidine (ALPHAGAN) 0.2 % ophthalmic solution Place 1 drop into both eyes 2 (two) times daily.      Docusate Sodium (COLACE PO) Take 1 capsule by mouth daily as needed. For constipation    ezetimibe (ZETIA) 10 MG tablet Take 1 tablet (10 mg total) by mouth daily. Qty: 30 tablet, Refills: 6    insulin glargine (LANTUS) 100 UNIT/ML injection Inject 0-22 Units into the skin 2  (two) times daily. 22 in am, 10 units in pm    insulin lispro (HUMALOG PEN) 100 UNIT/ML injection Inject 0-9 Units into the skin 4 (four) times daily. DO NOT GIVE WITHOUT CONSULTING PATIENT ON HER SLIDING SCALE    isosorbide mononitrate (IMDUR) 30 MG 24 hr tablet Take 1 tablet (30 mg total) by mouth daily. Qty: 30 tablet, Refills: 11    latanoprost (XALATAN) 0.005 % ophthalmic solution Place 1 drop into both eyes at bedtime.      levothyroxine (SYNTHROID, LEVOTHROID) 125 MCG tablet Take 125 mcg by mouth daily.      metoprolol succinate (TOPROL-XL) 25 MG 24 hr tablet Take 1 tablet (25 mg total) by mouth daily. Qty: 30 tablet, Refills: 6    quinapril (ACCUPRIL) 10 MG tablet Take 1 tablet (10 mg total) by mouth at bedtime. Qty: 30 tablet, Refills: 6    nitroGLYCERIN (NITROSTAT) 0.4 MG SL tablet Place 1 tablet (0.4 mg total) under the tongue every 5 (five) minutes as needed for chest pain. Qty: 25 tablet, Refills:  3        Outstanding Labs/Studies  BMET next week.  Duration of Discharge Encounter: Greater than 30 minutes including physician time.  Signed, Nicolasa Ducking NP 09/18/2011, 11:01 AM

## 2011-09-21 ENCOUNTER — Encounter: Payer: Self-pay | Admitting: Internal Medicine

## 2011-09-21 ENCOUNTER — Ambulatory Visit (INDEPENDENT_AMBULATORY_CARE_PROVIDER_SITE_OTHER): Payer: Medicare Other | Admitting: Internal Medicine

## 2011-09-21 ENCOUNTER — Other Ambulatory Visit: Payer: Self-pay | Admitting: *Deleted

## 2011-09-21 DIAGNOSIS — R0602 Shortness of breath: Secondary | ICD-10-CM

## 2011-09-21 DIAGNOSIS — I509 Heart failure, unspecified: Secondary | ICD-10-CM

## 2011-09-21 DIAGNOSIS — N289 Disorder of kidney and ureter, unspecified: Secondary | ICD-10-CM

## 2011-09-21 DIAGNOSIS — E785 Hyperlipidemia, unspecified: Secondary | ICD-10-CM

## 2011-09-21 DIAGNOSIS — I2510 Atherosclerotic heart disease of native coronary artery without angina pectoris: Secondary | ICD-10-CM

## 2011-09-21 DIAGNOSIS — R0609 Other forms of dyspnea: Secondary | ICD-10-CM

## 2011-09-21 DIAGNOSIS — I350 Nonrheumatic aortic (valve) stenosis: Secondary | ICD-10-CM

## 2011-09-21 DIAGNOSIS — R0600 Dyspnea, unspecified: Secondary | ICD-10-CM

## 2011-09-21 DIAGNOSIS — I359 Nonrheumatic aortic valve disorder, unspecified: Secondary | ICD-10-CM

## 2011-09-21 LAB — BASIC METABOLIC PANEL
CO2: 25 mEq/L (ref 19–32)
Calcium: 8.9 mg/dL (ref 8.4–10.5)
Chloride: 105 mEq/L (ref 96–112)
Sodium: 140 mEq/L (ref 135–145)

## 2011-09-21 NOTE — Progress Notes (Signed)
HPI Patient is a 65 year old with a hsitory of CAD (s/p CABG), DM, dyslipidemia, PVOD, renal insufficiency.  [name] has been complaining of more fatigue, more SOB.  She was seen by L Gerhardt and set up for a R and L heart cath. She was recently discharged from [facility] after a cardiac catheterization.   This was done by [name].  This showed:1. Elevated right heart pressures consistent with diastolic dysfunction.  2. Continued patency of the internal mammary artery to the distal left anterior descending artery with occlusion of the distal LAD as noted on previous studies.  3. High grade stenosis of a nondominant right coronary artery.  4. Progression of disease in the proximal dominant circumflex compared to the previous study of 2010.  5. Occlusion of the saphenous vein graft to the posterior descending branch which is derived from the circumflex vessel with high-grade stenosis of the circumflex portion of the PDA-this is chronic.  6. Continued patency of the saphenous vein graft to a obtuse marginal branch, similar to 2010.   On discussion with T. Stuckey p;an was to PTCA/stent L Cx. Procedure was to be staged to allow recovery of renal funtion. Patient was told this may or may not help her symptoms.  Since seen the patient remains on Lasix 40 per day.  She says she is urinating a lot.  Still has edema.  Still SOB>     Allergies  Allergen Reactions  . Adhesive (Tape) Other (See Comments)    "takes my skin off"  . Cephalexin Swelling and Rash  . Ciprofloxacin Swelling and Rash    Current Outpatient Prescriptions  Medication Sig Dispense Refill  . aspirin EC 325 MG EC tablet Take 325 mg by mouth daily.        [name] atorvastatin (LIPITOR) 40 MG tablet Take 1 tablet (40 mg total) by mouth at bedtime.  30 tablet  11  . brimonidine (ALPHAGAN) 0.2 % ophthalmic solution Place 1 drop into both eyes 2 (two) times daily.        Tery Sanfilippo Sodium (COLACE PO) Take 1 capsule by mouth  daily as needed. For constipation      . ezetimibe (ZETIA) 10 MG tablet Take 1 tablet (10 mg total) by mouth daily.  30 tablet  6  . furosemide (LASIX) 20 MG tablet Take 2 tablets (40 mg total) by mouth daily.      . insulin glargine (LANTUS) 100 UNIT/ML injection Inject 0-22 Units into the skin 2 (two) times daily. 22 in am, 10 units in pm      . insulin lispro (HUMALOG PEN) 100 UNIT/ML injection Inject 0-9 Units into the skin 4 (four) times daily. DO NOT GIVE WITHOUT CONSULTING PATIENT ON HER SLIDING SCALE      . isosorbide mononitrate (IMDUR) 30 MG 24 hr tablet Take 1 tablet (30 mg total) by mouth daily.  30 tablet  11  . latanoprost (XALATAN) 0.005 % ophthalmic solution Place 1 drop into both eyes at bedtime.        [name] levothyroxine (SYNTHROID, LEVOTHROID) 125 MCG tablet Take 125 mcg by mouth daily.        . metoprolol succinate (TOPROL-XL) 25 MG 24 hr tablet Take 1 tablet (25 mg total) by mouth daily.  30 tablet  6  . nitroGLYCERIN (NITROSTAT) 0.4 MG SL tablet Place 1 tablet (0.4 mg total) under the tongue every 5 (five) minutes as needed for chest pain.  25 tablet  3  .  quinapril (ACCUPRIL) 10 MG tablet Take 1 tablet (10 mg total) by mouth at bedtime.  30 tablet  6    Past Medical History  Diagnosis Date  . HTN (hypertension)   . Hypothyroidism   . Diabetes mellitus, type 2     insulin dependent  . History of recurrent UTIs   . CAD (coronary artery disease)     s/p CABG x3 in 1993; last cath in 2010  . Scleroderma   . Bilateral carpal tunnel syndrome 1970s  . 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 Followed by [name]  . Arthritis   . Anemia   . Claudication   . Aortic stenosis     Moderate per echo in May 2012  . Hyperlipidemia     on Lipitor  . Myocardial infarction 1986  . Seizure     ? seizure like event in 2010; workup included stress testing which led to repeat cath.   . Cat bite 2009    with MRSA;  required I & D  . Heart murmur   . Shortness of breath   . Cancer     breast cancer  . Blood transfusion     no reaction from transfusion  . GERD (gastroesophageal reflux disease)     Past Surgical History  Procedure Date  . Carpal tunnel release   . 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 Vitrectomy   . Tubal ligation   . Tonsillectomy   . Coronary artery bypass graft 1993    LIMA to LAD, SVG to distal LCX & SVG to Marginal  . Appendectomy   . Amputation     left first and second toes  . Cardiac catheterization 2010    LIMA to LAD patent yet with occluded distal LAD after graft insertion & retrograde is occluded. 3-4 septal perforators arise &are patent. SVG to a large marginal patent; SVG presumably to the distal dominant LCX is occluded, moderately high grade stenosis of the AV circumflex, but with distal stenoses involving a severely diseased marginal branch not amenable  to PCI  nor is inferior branch     Family History  Problem Relation Age of Onset  . Diabetes Mother     History   Social History  . Marital Status: Single    Spouse Name: N/A    Number of Children: N/A  . Years of Education: N/A   Occupational History  . Environmental health practitioner    Social History Main Topics  . Smoking status: Never Smoker   . Smokeless tobacco: Never Used  . Alcohol Use: No  . Drug Use: No  . Sexually Active: Not Currently    Birth Control/ Protection: Post-menopausal   Other Topics Concern  . Not on file   Social History Narrative  . No narrative on file    Review of Systems:  All systems reviewed.  They are negative to the above problem except as previously stated.  Vital Signs: BP 138/56  Pulse 64  Ht 5' 3.5" (1.613 m)  Wt 180 lb (81.647 kg)  BMI 31.39 kg/m2  Physical Exam  Patient is in NAD HEENT:  Normocephalic, atraumatic. EOMI, PERRLA.  Neck: JVP is mildly elevated. No thyromegaly. No bruits.  Lungs: clear to auscultation. No rales no wheezes.  Heart: Regular rate and  rhythm. Normal S1, S2. No S3.   No significant murmurs. PMI not displaced.  Abdomen:  Supple, nontender. Normal bowel  sounds. No masses. No hepatomegaly.  Extremities: . 1+ lower extremity edema.  Musculoskeletal :moving all extremities.  Neuro:   alert and oriented x3.  CN II-XII grossly intact.   Assessment and Plan:

## 2011-09-21 NOTE — Patient Instructions (Signed)
Lab work today BMP BNP. Will call you with results.

## 2011-09-23 DIAGNOSIS — N289 Disorder of kidney and ureter, unspecified: Secondary | ICD-10-CM | POA: Insufficient documentation

## 2011-09-23 NOTE — Assessment & Plan Note (Signed)
Plan is for staged PCI as noted above.  Continue meds for now.  Will check labs.

## 2011-09-23 NOTE — Assessment & Plan Note (Signed)
Will set up fro BMET and BNP.

## 2011-09-23 NOTE — Assessment & Plan Note (Signed)
Keep on statin. 

## 2011-09-23 NOTE — Assessment & Plan Note (Signed)
Volume is up on exam  Though not bad.  Will check labs.  No changes for now.

## 2011-09-23 NOTE — Assessment & Plan Note (Signed)
Not critical on cath.

## 2011-09-24 ENCOUNTER — Other Ambulatory Visit (INDEPENDENT_AMBULATORY_CARE_PROVIDER_SITE_OTHER): Payer: Medicare Other | Admitting: *Deleted

## 2011-09-24 DIAGNOSIS — I509 Heart failure, unspecified: Secondary | ICD-10-CM

## 2011-09-24 DIAGNOSIS — R0602 Shortness of breath: Secondary | ICD-10-CM

## 2011-09-25 LAB — BASIC METABOLIC PANEL
CO2: 28 mEq/L (ref 19–32)
Calcium: 8.7 mg/dL (ref 8.4–10.5)
GFR: 33.52 mL/min — ABNORMAL LOW (ref >60.00)
Sodium: 136 mEq/L (ref 135–145)

## 2011-09-25 LAB — BRAIN NATRIURETIC PEPTIDE: Pro B Natriuretic peptide (BNP): 607 pg/mL — ABNORMAL HIGH (ref 0.0–100.0)

## 2011-09-28 ENCOUNTER — Telehealth: Payer: Self-pay | Admitting: *Deleted

## 2011-09-28 ENCOUNTER — Encounter (HOSPITAL_COMMUNITY): Payer: Self-pay | Admitting: Pharmacy Technician

## 2011-09-28 ENCOUNTER — Ambulatory Visit (INDEPENDENT_AMBULATORY_CARE_PROVIDER_SITE_OTHER): Payer: Medicare Other | Admitting: *Deleted

## 2011-09-28 ENCOUNTER — Other Ambulatory Visit: Payer: Self-pay | Admitting: *Deleted

## 2011-09-28 DIAGNOSIS — I2510 Atherosclerotic heart disease of native coronary artery without angina pectoris: Secondary | ICD-10-CM

## 2011-09-28 NOTE — Telephone Encounter (Signed)
LMOM to see if she would like to set up heart cath with [facility] for 12/17 with Dr.Stuckey.

## 2011-09-28 NOTE — Telephone Encounter (Signed)
Called patient and advised that Dr.Ross would call her after her labs were done today and let her know if she should hold her Lasix.  Patient verbalized understanding.

## 2011-09-28 NOTE — Telephone Encounter (Signed)
Called patient per Dr.Ross and advised her that creat level was 1.64 and she is to hold Lasix on Saturday,Sunday, and Monday. Patient verbalized understanding.

## 2011-09-28 NOTE — Telephone Encounter (Signed)
Pt calling re to leave off the lasix before procedure?

## 2011-10-01 ENCOUNTER — Ambulatory Visit (HOSPITAL_COMMUNITY)
Admission: RE | Admit: 2011-10-01 | Discharge: 2011-10-01 | Disposition: A | Discharge status: Home / Self Care | Payer: Medicare Other | Source: Ambulatory Visit | Attending: Cardiology | Admitting: Cardiology

## 2011-10-01 ENCOUNTER — Telehealth: Payer: Self-pay | Admitting: Cardiology

## 2011-10-01 ENCOUNTER — Encounter (HOSPITAL_COMMUNITY)
Admission: RE | Disposition: A | Discharge status: Home / Self Care | Payer: Self-pay | Source: Ambulatory Visit | Attending: Cardiology

## 2011-10-01 DIAGNOSIS — Z5320 Procedure and treatment not carried out because of patient's decision for unspecified reasons: Secondary | ICD-10-CM | POA: Insufficient documentation

## 2011-10-01 DIAGNOSIS — I2510 Atherosclerotic heart disease of native coronary artery without angina pectoris: Secondary | ICD-10-CM | POA: Insufficient documentation

## 2011-10-01 DIAGNOSIS — R0602 Shortness of breath: Secondary | ICD-10-CM | POA: Insufficient documentation

## 2011-10-01 DIAGNOSIS — H359 Unspecified retinal disorder: Secondary | ICD-10-CM | POA: Insufficient documentation

## 2011-10-01 LAB — APTT: aPTT: 33 seconds (ref 24–37)

## 2011-10-01 LAB — BASIC METABOLIC PANEL
Calcium: 9.5 mg/dL (ref 8.4–10.5)
GFR calc Af Amer: 49 mL/min — ABNORMAL LOW (ref >90)
GFR calc non Af Amer: 42 mL/min — ABNORMAL LOW (ref >90)
Potassium: 4.1 mEq/L (ref 3.5–5.1)
Sodium: 135 mEq/L (ref 135–145)

## 2011-10-01 LAB — CBC
MCHC: 32.8 g/dL (ref 30.0–36.0)
Platelets: 217 10*3/uL (ref 150–400)
RDW: 13.5 % (ref 11.5–15.5)

## 2011-10-01 LAB — GLUCOSE, CAPILLARY: Glucose-Capillary: 121 mg/dL — ABNORMAL HIGH (ref 70–99)

## 2011-10-01 LAB — PROTIME-INR: INR: 1.09 (ref 0.00–1.49)

## 2011-10-01 SURGERY — PERCUTANEOUS CORONARY STENT INTERVENTION (PCI-S)
Anesthesia: LOCAL

## 2011-10-01 MED ORDER — ASPIRIN 81 MG PO CHEW: 324.0000 mg | CHEWABLE_TABLET | ORAL | Status: DC

## 2011-10-01 MED ORDER — CLOPIDOGREL BISULFATE 300 MG PO TABS
ORAL_TABLET | ORAL | Status: AC
  Filled 2011-10-01: qty 2

## 2011-10-01 MED ORDER — SODIUM CHLORIDE 0.9 % IJ SOLN: 3.0000 mL | INTRAMUSCULAR | Status: DC | PRN

## 2011-10-01 MED ORDER — SODIUM CHLORIDE 0.9 % IJ SOLN: 3.0000 mL | Freq: Two times a day (BID) | INTRAMUSCULAR | Status: DC

## 2011-10-01 MED ORDER — CLOPIDOGREL BISULFATE 300 MG PO TABS: 600.0000 mg | ORAL_TABLET | Freq: Once | ORAL | Status: DC

## 2011-10-01 MED ORDER — ONDANSETRON HCL 4 MG/2ML IJ SOLN: 4.0000 mg | Freq: Four times a day (QID) | INTRAMUSCULAR | Status: DC | PRN

## 2011-10-01 MED ORDER — SODIUM CHLORIDE 0.9 % IV SOLN: 250.0000 mL | INTRAVENOUS | Status: DC | PRN

## 2011-10-01 MED ORDER — ASPIRIN 81 MG PO CHEW
CHEWABLE_TABLET | ORAL | Status: AC
  Filled 2011-10-01: qty 4

## 2011-10-01 MED ORDER — SODIUM CHLORIDE 0.9 % IV SOLN: INTRAVENOUS | Status: DC

## 2011-10-01 MED ORDER — INSULIN ASPART 100 UNIT/ML ~~LOC~~ SOLN: 0.0000 [IU] | SUBCUTANEOUS | Status: DC

## 2011-10-01 MED ORDER — ACETAMINOPHEN 325 MG PO TABS: 650.0000 mg | ORAL_TABLET | ORAL | Status: DC | PRN

## 2011-10-01 NOTE — Discharge Summary (Signed)
Ms. Folson came in for PCI of the CFX.  I went through with her in great detail the discussions I had reviewing her data with [name].  We discussed the treatment options, the potential causes of her shortness of breath, the anticipated benefits of a procedure.  She and I have discussed extensively that her dyspnea has been likely multifactorial, that there is some progression of her proximal circumflex, and this might or might not lead to improvement.  She recognizes there is weight gain, declining activity, her valve issue, and likely a component of diastolic heart failure.  She is concerned about the antiplatelet agents, and potential that it could enhance bleeding with her retinal disease.  She would like to defer until after the holidays, until we have had dialogue with Dr. Everlena Cooper in the Opthalmology department at [facility], and I will try to make that contact within the next couple of days.  She is conflicted about the procedure with all of these factors, and would like to defer.  Her lesion does appear to be stable, and given all of her concerns, agree that deferral is best.    [name] 10:39 AM 10/01/2011

## 2011-10-01 NOTE — Telephone Encounter (Signed)
Pt gave wrong phone number for  dr Everlena Cooper [phone] that dr Riley Kill was to call Tuesday 12-18, pt aware that she missed two appts due to [place]

## 2011-10-04 ENCOUNTER — Encounter: Payer: Medicare Other | Admitting: Physician Assistant

## 2011-10-04 LAB — BASIC METABOLIC PANEL
BUN/Creatinine Ratio: 16 (ref 11–26)
BUN: 26 mg/dL (ref 8–27)
CO2: 29 mmol/L (ref 20–32)
Calcium: 8.9 mg/dL (ref 8.6–10.2)
Creatinine, Ser: 1.64 mg/dL — ABNORMAL HIGH (ref 0.57–1.00)
Glucose: 208 mg/dL — ABNORMAL HIGH (ref 65–99)

## 2011-10-04 LAB — PROTIME-INR
INR: 1 (ref 0.8–1.2)
Prothrombin Time: 11.2 s (ref 9.1–12.0)

## 2011-10-04 LAB — CBC WITH DIFFERENTIAL/PLATELET
Basos: 1 % (ref 0–3)
Eosinophils Absolute: 0.4 10*3/uL (ref 0.0–0.4)
HCT: 33.9 % — ABNORMAL LOW (ref 34.0–46.6)
Hemoglobin: 11.3 g/dL (ref 11.1–15.9)
Lymphs: 20 % (ref 14–46)
MCH: 30.5 pg (ref 26.6–33.0)
MCV: 91 fL (ref 79–97)
Neutrophils Absolute: 4.3 10*3/uL (ref 1.8–7.8)
RBC: 3.71 x10E6/uL — ABNORMAL LOW (ref 3.77–5.28)

## 2011-10-16 HISTORY — PX: CARDIAC CATHETERIZATION: SHX172

## 2011-10-23 ENCOUNTER — Telehealth: Payer: Self-pay | Admitting: Cardiology

## 2011-10-23 NOTE — Telephone Encounter (Signed)
Pt calling re having a stent placement, dr Riley Kill was to discuss with eye dr first, dr Sharee Pimple [phone], pt calling to  fu if this has been done, not heard anything

## 2011-10-24 NOTE — Telephone Encounter (Signed)
I spoke with the pt and made her aware that Dr Riley Kill had placed a call to Dr Everlena Cooper and is awaiting a return phone call.

## 2011-10-30 ENCOUNTER — Telehealth: Payer: Self-pay | Admitting: *Deleted

## 2011-10-30 NOTE — Telephone Encounter (Signed)
Appointment scheduled for 2/1 at 845 am with Dr.Ross. This is the first day that PR and TS are both in the office at the same time. Patient states that they both need to be in on the discussion concerning medical therapy VS stent.

## 2011-10-30 NOTE — Telephone Encounter (Signed)
I left a message for Burna Mortimer regarding this in Dr. Moises Blood office and have followed back up, this time with Irving Burton, regarding the issue.  Also given the issues have also discussed with Dr. Ross--re:uncertainty of benefit of procedure, which MS. Pinch is also aware of, and focus on potentially approaching other aspects of her overall issues for shortness of breath.  She is to follow with [name], and will continue to discuss as I get information back from [facility].  Ms. Boss was concerned about DAPT needs, and retinopathy, and similarly, use on non DES with her DM could result in clearly a worse one year outcome.   TS

## 2011-10-30 NOTE — Telephone Encounter (Signed)
Dr.Stuckey called to ask that we schedule a follow up for this patient to discuss medical therapy. He is still trying to contact her eye doc at [facility]. LMOM for her to call me back to schedule a follow up appointment.

## 2011-11-08 NOTE — Discharge Summary (Signed)
 Discharge Summary   Patient ID: [name],  MRN: [id number], DOB/AGE: 11/16/1945 66 y.o.  Admit date: 09/14/2011 Discharge date: 09/15/2011  Discharge Diagnoses Principal Problem:  *CAD Active Problems:  Unspecified hypothyroidism  DIAB W/UNS COMP TYPE I [JUV] NOT STATED UNCNTRL  DYSLIPIDEMIA  HYPERTENSION, UNSPECIFIED  PERIPHERAL CIRCULATORY DISORDER  Aortic stenosis   Allergies Allergies  Allergen Reactions  . Adhesive (Tape) Other (See Comments)    "takes my skin off"  . Cephalexin Swelling and Rash  . Ciprofloxacin Swelling and Rash    Procedures None   History of Present Illness  66 yo female with PMHx significant for multiple issues including CAD, moderate AS (per ECHO 05/12), HL, hx of MI (1986) c/o progressive SOB, chest heaviness in neck and substernal area and DOE who was scheduled for elective repeat left and right cardiac catheterization on 11/30.   Hospital Course   Pt was unable to be admitted to hospital on 10/29. She was advised to come to Short Stay Center at [facility] for IV hydration and her catheterization was subsequently moved back. While at Short Stay, her Cr was found to be elevated at 1.38 and catheterization was deferred. She continued to hydrate well, however, BUN and Cr remained elevated. It was found that her BUN/Cr were WNL approx. 1 year ago and deemed high risk for IDDM. Pt will be discharged today, advised to follow PO hydration and heart healthy diet. Lasix will be held. Will plan for catheterization on 12/03.   Discharge Vitals:  Blood pressure 116/36, pulse 63, temperature 98.6 F (37 C), temperature source Oral, resp. rate 16, height 5' 3.5" (1.613 m), weight 80.65 kg (177 lb 12.8 oz), SpO2 96.00%.   Weight change:  I/O: +1139.7  Labs:   Lab Results  Component Value Date   WBC 6.0 09/15/2011   HGB 10.2* 09/15/2011   HCT 30.8* 09/15/2011   MCV 91.7 09/15/2011   PLT 150 09/15/2011    Lab 09/15/11 0500  NA 138  K 4.6  CL 106   CO2 24  BUN 33*  CREATININE 1.37*  CALCIUM 8.4  PROT --  BILITOT --  ALKPHOS --  ALT --  AST --  GLUCOSE 264*   Results for Ketchum, Baileigh A (MRN [id number]) as of 09/15/2011 10:56  Ref. Range 09/07/2011 12:36 09/13/2011 11:08 09/14/2011 09:30 09/14/2011 13:48 09/15/2011 05:00  BUN Latest Range: 8-27 mg/dL 33 (H) 35 (H) 35 (H) 35 (H) 33 (H)  Creat Latest Range: 0.50-1.10 mg/dL 1.5 (H) 1.54 (H) 1.38 (H) 1.36 (H) 1.37 (H)  GFR calc non Af Amer Latest Range: >90 mL/min  35 (L) 39 (L) 40 (L) 39 (L)  GFR calc Af Amer Latest Range: >90 mL/min  40 (L) 45 (L) 46 (L) 45 (L)  Glucose Latest Range: 70-99 mg/dL 135 (H) 321 (H) 292 (H) 178 (H) 264 (H)  BUN/Creatinine Ratio Latest Range: 11-26   23     GFR Latest Range: >60.00 mL/min 37.48 (L)         Disposition:  Discharge Orders    Future Appointments: Provider: Department: Dept Phone: Center:   06/17/2012 2:00 PM [facility] [phone] [facility]   06/17/2012 2:30 PM [facility] [phone] [facility]   06/17/2012 3:15 PM Todd F Early, MD [facility] [phone] [facility]     Follow-up Information    Follow up with [facility] on 09/17/2011. (At 6:30 AM for heart catheterization. )    Contact information:   [address] [place] [address]          Discharge Medications:   Current Discharge Medication List    START taking these medications   Details  nitroGLYCERIN (NITROSTAT) 0.4 MG SL tablet Place 1 tablet (0.4 mg total) under the tongue every 5 (five) minutes as needed for chest pain. Qty: 25 tablet, Refills: 3      CONTINUE these medications which have CHANGED   Details  furosemide (LASIX) 20 MG tablet Take 2 tabs in the am and take 1 tab in the pm Qty: 90 tablet, Refills: 6   Comments: Hold Lasix until after procedure Monday.      CONTINUE these medications which have NOT CHANGED   Details  aspirin EC 325 MG EC tablet Take 325 mg by mouth daily.        atorvastatin (LIPITOR) 40 MG tablet Take 1 tablet (40 mg total) by mouth at bedtime. Qty: 30 tablet, Refills: 11   Associated Diagnoses: Hyperlipidemia    brimonidine (ALPHAGAN) 0.2 % ophthalmic solution Place 1 drop into both eyes 2 (two) times daily.      Docusate Sodium (COLACE PO) Take 1 capsule by mouth daily as needed. For constipation    ezetimibe (ZETIA) 10 MG tablet Take 1 tablet (10 mg total) by mouth daily. Qty: 30 tablet, Refills: 6    insulin glargine (LANTUS) 100 UNIT/ML injection Inject 22 Units into the skin 2 (two) times daily. 22 in am, 10 units in pm    insulin lispro (HUMALOG PEN) 100 UNIT/ML injection Inject 0-9 Units into the skin 4 (four) times daily. Per sliding scale per patient    isosorbide mononitrate (IMDUR) 30 MG 24 hr tablet Take 1 tablet (30 mg total) by mouth daily. Qty: 30 tablet, Refills: 11    latanoprost (XALATAN) 0.005 % ophthalmic solution Place 1 drop into both eyes at bedtime.      levothyroxine (SYNTHROID, LEVOTHROID) 125 MCG tablet Take 125 mcg by mouth daily.      metoprolol succinate (TOPROL-XL) 25 MG 24 hr tablet Take 1 tablet (25 mg total) by mouth daily. Qty: 30 tablet, Refills: 6    quinapril (ACCUPRIL) 10 MG tablet Take 1 tablet (10 mg total) by mouth at bedtime. Qty: 30 tablet, Refills: 6        Outstanding Labs/Studies: None  Duration of Discharge Encounter: 30 minutes including physician time.  Signed, Tony Renda Pohlman, PA-C 09/15/2011, 10:14 AM     

## 2011-11-12 ENCOUNTER — Inpatient Hospital Stay (HOSPITAL_COMMUNITY)
Admission: EM | Admit: 2011-11-12 | Discharge: 2011-11-17 | DRG: 292 | Disposition: A | Discharge status: Home / Self Care | Payer: Medicare Other | Source: Ambulatory Visit | Attending: Cardiovascular Disease | Admitting: Cardiovascular Disease

## 2011-11-12 ENCOUNTER — Other Ambulatory Visit: Payer: Self-pay

## 2011-11-12 ENCOUNTER — Encounter (HOSPITAL_COMMUNITY): Payer: Self-pay | Admitting: *Deleted

## 2011-11-12 DIAGNOSIS — I509 Heart failure, unspecified: Secondary | ICD-10-CM | POA: Diagnosis present

## 2011-11-12 DIAGNOSIS — E119 Type 2 diabetes mellitus without complications: Secondary | ICD-10-CM | POA: Diagnosis present

## 2011-11-12 DIAGNOSIS — I739 Peripheral vascular disease, unspecified: Secondary | ICD-10-CM | POA: Diagnosis present

## 2011-11-12 DIAGNOSIS — Z794 Long term (current) use of insulin: Secondary | ICD-10-CM

## 2011-11-12 DIAGNOSIS — I359 Nonrheumatic aortic valve disorder, unspecified: Secondary | ICD-10-CM | POA: Diagnosis present

## 2011-11-12 DIAGNOSIS — R0989 Other specified symptoms and signs involving the circulatory and respiratory systems: Secondary | ICD-10-CM | POA: Diagnosis not present

## 2011-11-12 DIAGNOSIS — Z7982 Long term (current) use of aspirin: Secondary | ICD-10-CM

## 2011-11-12 DIAGNOSIS — M349 Systemic sclerosis, unspecified: Secondary | ICD-10-CM | POA: Diagnosis present

## 2011-11-12 DIAGNOSIS — Z79899 Other long term (current) drug therapy: Secondary | ICD-10-CM

## 2011-11-12 DIAGNOSIS — Z8614 Personal history of Methicillin resistant Staphylococcus aureus infection: Secondary | ICD-10-CM

## 2011-11-12 DIAGNOSIS — Z87440 Personal history of urinary (tract) infections: Secondary | ICD-10-CM | POA: Diagnosis not present

## 2011-11-12 DIAGNOSIS — M129 Arthropathy, unspecified: Secondary | ICD-10-CM | POA: Diagnosis present

## 2011-11-12 DIAGNOSIS — E871 Hypo-osmolality and hyponatremia: Secondary | ICD-10-CM | POA: Diagnosis present

## 2011-11-12 DIAGNOSIS — I252 Old myocardial infarction: Secondary | ICD-10-CM

## 2011-11-12 DIAGNOSIS — I2510 Atherosclerotic heart disease of native coronary artery without angina pectoris: Secondary | ICD-10-CM | POA: Diagnosis not present

## 2011-11-12 DIAGNOSIS — IMO0002 Reserved for concepts with insufficient information to code with codable children: Secondary | ICD-10-CM

## 2011-11-12 DIAGNOSIS — R0602 Shortness of breath: Secondary | ICD-10-CM | POA: Diagnosis not present

## 2011-11-12 DIAGNOSIS — Z853 Personal history of malignant neoplasm of breast: Secondary | ICD-10-CM

## 2011-11-12 DIAGNOSIS — Z951 Presence of aortocoronary bypass graft: Secondary | ICD-10-CM

## 2011-11-12 DIAGNOSIS — D649 Anemia, unspecified: Secondary | ICD-10-CM | POA: Diagnosis present

## 2011-11-12 DIAGNOSIS — R0609 Other forms of dyspnea: Secondary | ICD-10-CM | POA: Diagnosis not present

## 2011-11-12 DIAGNOSIS — I200 Unstable angina: Secondary | ICD-10-CM | POA: Diagnosis not present

## 2011-11-12 DIAGNOSIS — I498 Other specified cardiac arrhythmias: Secondary | ICD-10-CM | POA: Diagnosis not present

## 2011-11-12 DIAGNOSIS — I129 Hypertensive chronic kidney disease with stage 1 through stage 4 chronic kidney disease, or unspecified chronic kidney disease: Secondary | ICD-10-CM | POA: Diagnosis present

## 2011-11-12 DIAGNOSIS — E785 Hyperlipidemia, unspecified: Secondary | ICD-10-CM | POA: Insufficient documentation

## 2011-11-12 DIAGNOSIS — I350 Nonrheumatic aortic (valve) stenosis: Secondary | ICD-10-CM

## 2011-11-12 DIAGNOSIS — R0600 Dyspnea, unspecified: Secondary | ICD-10-CM

## 2011-11-12 DIAGNOSIS — E1029 Type 1 diabetes mellitus with other diabetic kidney complication: Secondary | ICD-10-CM

## 2011-11-12 DIAGNOSIS — I10 Essential (primary) hypertension: Secondary | ICD-10-CM | POA: Insufficient documentation

## 2011-11-12 DIAGNOSIS — K219 Gastro-esophageal reflux disease without esophagitis: Secondary | ICD-10-CM | POA: Diagnosis present

## 2011-11-12 DIAGNOSIS — I2789 Other specified pulmonary heart diseases: Secondary | ICD-10-CM | POA: Diagnosis present

## 2011-11-12 DIAGNOSIS — E875 Hyperkalemia: Secondary | ICD-10-CM | POA: Diagnosis present

## 2011-11-12 DIAGNOSIS — J9819 Other pulmonary collapse: Secondary | ICD-10-CM | POA: Diagnosis not present

## 2011-11-12 DIAGNOSIS — N182 Chronic kidney disease, stage 2 (mild): Secondary | ICD-10-CM | POA: Diagnosis present

## 2011-11-12 DIAGNOSIS — E039 Hypothyroidism, unspecified: Secondary | ICD-10-CM | POA: Insufficient documentation

## 2011-11-12 DIAGNOSIS — Z881 Allergy status to other antibiotic agents status: Secondary | ICD-10-CM

## 2011-11-12 DIAGNOSIS — S98139A Complete traumatic amputation of one unspecified lesser toe, initial encounter: Secondary | ICD-10-CM

## 2011-11-12 DIAGNOSIS — I219 Acute myocardial infarction, unspecified: Secondary | ICD-10-CM | POA: Diagnosis not present

## 2011-11-12 DIAGNOSIS — N289 Disorder of kidney and ureter, unspecified: Secondary | ICD-10-CM | POA: Insufficient documentation

## 2011-11-12 DIAGNOSIS — I5033 Acute on chronic diastolic (congestive) heart failure: Principal | ICD-10-CM | POA: Diagnosis present

## 2011-11-12 LAB — BASIC METABOLIC PANEL
BUN: 36 mg/dL — ABNORMAL HIGH (ref 6–23)
Calcium: 9.6 mg/dL (ref 8.4–10.5)
Creatinine, Ser: 1.41 mg/dL — ABNORMAL HIGH (ref 0.50–1.10)
GFR calc Af Amer: 44 mL/min — ABNORMAL LOW (ref >90)
GFR calc non Af Amer: 38 mL/min — ABNORMAL LOW (ref >90)

## 2011-11-12 LAB — GLUCOSE, CAPILLARY: Glucose-Capillary: 502 mg/dL — ABNORMAL HIGH (ref 70–99)

## 2011-11-12 LAB — CBC
HCT: 32.6 % — ABNORMAL LOW (ref 36.0–46.0)
MCV: 88.3 fL (ref 78.0–100.0)
RDW: 13.6 % (ref 11.5–15.5)
WBC: 7.2 10*3/uL (ref 4.0–10.5)

## 2011-11-12 LAB — COMPREHENSIVE METABOLIC PANEL
BUN: 34 mg/dL — ABNORMAL HIGH (ref 6–23)
CO2: 23 mEq/L (ref 19–32)
Calcium: 9.4 mg/dL (ref 8.4–10.5)
Creatinine, Ser: 1.36 mg/dL — ABNORMAL HIGH (ref 0.50–1.10)
GFR calc Af Amer: 46 mL/min — ABNORMAL LOW (ref >90)
GFR calc non Af Amer: 40 mL/min — ABNORMAL LOW (ref >90)
Glucose, Bld: 563 mg/dL (ref 70–99)

## 2011-11-12 LAB — CK TOTAL AND CKMB (NOT AT ARMC)
CK, MB: 3.5 ng/mL (ref 0.3–4.0)
Relative Index: INVALID (ref 0.0–2.5)

## 2011-11-12 LAB — DIFFERENTIAL
Basophils Absolute: 0 10*3/uL (ref 0.0–0.1)
Eosinophils Relative: 3 % (ref 0–5)
Lymphocytes Relative: 15 % (ref 12–46)
Lymphs Abs: 1 10*3/uL (ref 0.7–4.0)
Monocytes Absolute: 0.5 10*3/uL (ref 0.1–1.0)

## 2011-11-12 MED ORDER — INSULIN GLARGINE 100 UNIT/ML ~~LOC~~ SOLN: 10.0000 [IU] | Freq: Two times a day (BID) | SUBCUTANEOUS | Status: DC

## 2011-11-12 MED ORDER — ASPIRIN EC 325 MG PO TBEC
325.0000 mg | DELAYED_RELEASE_TABLET | Freq: Every day | ORAL | Status: DC
  Administered 2011-11-13 – 2011-11-14 (×2): 325 mg via ORAL
  Filled 2011-11-12 (×3): qty 1

## 2011-11-12 MED ORDER — SODIUM CHLORIDE 0.9 % IV SOLN: 250.0000 mL | INTRAVENOUS | Status: DC | PRN

## 2011-11-12 MED ORDER — NITROGLYCERIN 0.4 MG SL SUBL: 0.4000 mg | SUBLINGUAL_TABLET | SUBLINGUAL | Status: DC | PRN

## 2011-11-12 MED ORDER — METOPROLOL SUCCINATE ER 25 MG PO TB24
25.0000 mg | ORAL_TABLET | Freq: Every day | ORAL | Status: DC
  Administered 2011-11-13 – 2011-11-17 (×4): 25 mg via ORAL
  Filled 2011-11-12 (×5): qty 1

## 2011-11-12 MED ORDER — ENOXAPARIN SODIUM 40 MG/0.4ML ~~LOC~~ SOLN
40.0000 mg | SUBCUTANEOUS | Status: DC
  Filled 2011-11-12 (×2): qty 0.4

## 2011-11-12 MED ORDER — ONDANSETRON HCL 4 MG/2ML IJ SOLN: 4.0000 mg | Freq: Four times a day (QID) | INTRAMUSCULAR | Status: DC | PRN

## 2011-11-12 MED ORDER — EZETIMIBE 10 MG PO TABS
10.0000 mg | ORAL_TABLET | Freq: Every day | ORAL | Status: DC
  Administered 2011-11-13 (×2): 10 mg via ORAL
  Filled 2011-11-12 (×3): qty 1

## 2011-11-12 MED ORDER — ROSUVASTATIN CALCIUM 20 MG PO TABS
20.0000 mg | ORAL_TABLET | Freq: Every day | ORAL | Status: DC
  Administered 2011-11-13 (×2): 20 mg via ORAL
  Filled 2011-11-12 (×3): qty 1

## 2011-11-12 MED ORDER — ISOSORBIDE MONONITRATE ER 30 MG PO TB24
30.0000 mg | ORAL_TABLET | Freq: Every day | ORAL | Status: DC
  Administered 2011-11-13 (×2): 30 mg via ORAL
  Filled 2011-11-12 (×3): qty 1

## 2011-11-12 MED ORDER — BRIMONIDINE TARTRATE 0.2 % OP SOLN
1.0000 [drp] | Freq: Two times a day (BID) | OPHTHALMIC | Status: DC
  Administered 2011-11-13 – 2011-11-17 (×9): 1 [drp] via OPHTHALMIC
  Filled 2011-11-12: qty 5

## 2011-11-12 MED ORDER — SODIUM CHLORIDE 0.9 % IV SOLN
INTRAVENOUS | Status: AC
  Administered 2011-11-13: 01:00:00 via INTRAVENOUS

## 2011-11-12 MED ORDER — ZOLPIDEM TARTRATE 5 MG PO TABS: 5.0000 mg | ORAL_TABLET | Freq: Every evening | ORAL | Status: DC | PRN

## 2011-11-12 MED ORDER — FUROSEMIDE 40 MG PO TABS: 40.0000 mg | ORAL_TABLET | Freq: Every day | ORAL | Status: DC

## 2011-11-12 MED ORDER — LISINOPRIL 10 MG PO TABS
10.0000 mg | ORAL_TABLET | Freq: Every day | ORAL | Status: DC
  Filled 2011-11-12: qty 1

## 2011-11-12 MED ORDER — SODIUM CHLORIDE 0.9 % IV SOLN: Freq: Once | INTRAVENOUS | Status: DC

## 2011-11-12 MED ORDER — ASPIRIN EC 81 MG PO TBEC: 81.0000 mg | DELAYED_RELEASE_TABLET | Freq: Every day | ORAL | Status: DC

## 2011-11-12 MED ORDER — ALPRAZOLAM 0.25 MG PO TABS: 0.2500 mg | ORAL_TABLET | Freq: Two times a day (BID) | ORAL | Status: DC | PRN

## 2011-11-12 MED ORDER — SODIUM CHLORIDE 0.9 % IJ SOLN: 3.0000 mL | INTRAMUSCULAR | Status: DC | PRN

## 2011-11-12 MED ORDER — LEVOTHYROXINE SODIUM 125 MCG PO TABS
125.0000 ug | ORAL_TABLET | Freq: Every day | ORAL | Status: DC
  Administered 2011-11-13 – 2011-11-17 (×4): 125 ug via ORAL
  Filled 2011-11-12 (×5): qty 1

## 2011-11-12 MED ORDER — LATANOPROST 0.005 % OP SOLN
1.0000 [drp] | Freq: Every day | OPHTHALMIC | Status: DC
  Administered 2011-11-13 – 2011-11-16 (×5): 1 [drp] via OPHTHALMIC
  Filled 2011-11-12: qty 2.5

## 2011-11-12 MED ORDER — SODIUM CHLORIDE 0.9 % IJ SOLN
3.0000 mL | Freq: Two times a day (BID) | INTRAMUSCULAR | Status: DC
  Administered 2011-11-13 (×2): 3 mL via INTRAVENOUS
  Administered 2011-11-15: 22:00:00 via INTRAVENOUS
  Administered 2011-11-16 – 2011-11-17 (×3): 3 mL via INTRAVENOUS

## 2011-11-12 MED ORDER — ACETAMINOPHEN 325 MG PO TABS: 650.0000 mg | ORAL_TABLET | ORAL | Status: DC | PRN

## 2011-11-12 NOTE — ED Notes (Signed)
2014-01 Ready 

## 2011-11-12 NOTE — Telephone Encounter (Signed)
FU Call: pt calling wanting to speak to nurse/md about pt current health condition. Pt stated she is not in good shape. Pt c/o sob (so bad that she can hardly walk), pt has had to take nitro 4xs in the last 5 days, pt also c/o dizziness. Pt said she doesn't know if she can wait until Friday to see MD. Pt doesn't have chest pain. Pt wants to know if it is ok for her to get a stent-if so pt wants to go to the hospital and schedule the stent.   Pt is c/o some sob now after walking.   Please return pt call to discuss further.  Pt wanted to leave msg for Dr. Carmelia Roller however pt said she is under the care of both [name] and Dr. Riley Kill. Pt has appt with [name] this Friday 11/16/11 and was told that Dr. Riley Kill would step in and see pt during appt with [name].

## 2011-11-12 NOTE — ED Notes (Signed)
Dr. Riley Kill MD at bedside.

## 2011-11-12 NOTE — ED Notes (Signed)
Dr. Sallyanne Kuster paged to address current sugar. Verbal order for 10 units novolog IV. Pt refused this order for 10 units IV to address current blood glucose. PT reports 10 units is too much and she would like to give herself 8 units. RN called Dr. Sallyanne Kuster. Dr. Sallyanne Kuster oked this. PT to give 8 units novolog 8 units SQ in RL abdomen at this time.

## 2011-11-12 NOTE — ED Notes (Signed)
Dr. Estell Harpin notified of elevated Troponin by B. Bing Plume, EMT

## 2011-11-12 NOTE — Telephone Encounter (Signed)
Dr.Ross spoke with patient and advised her to report to the [facility] ER via 911. She was advised not to drive herself. Patient reports that she had something to do right now but would go to the ER in about 1 hour. Dr.Ross notified the Biomedical scientist and ER staff.

## 2011-11-12 NOTE — ED Notes (Signed)
cbg [id number]

## 2011-11-12 NOTE — H&P (Signed)
History and Physical  Patient ID: [name] Patient ID: [name] MRN: [id number], DOB/AGE: Mar 03, 1946 66 y.o. Date of Encounter: 11/12/2011  Primary Physician: No primary provider on file. Primary Cardiologist: PR/TS   Chief Complaint: SOB  HPI: Mr. [name] is a 66 year old female with a history of coronary artery disease. She was cathed in December of 2012. Results of the catheter listed below.  Initially, percutaneous intervention to the circumflex was planned. However, the note below details the discussion that [name]. Riley [name] had with [name] regarding this procedure.  Per [name] note: [name] came in for PCI of the CFX. I went through with her in great detail the discussions I had reviewing her data with [name]. Tenny [name]. We discussed the treatment options, the potential causes of her shortness of breath, the anticipated benefits of a procedure. She and I have discussed extensively that her dyspnea has been likely multifactorial, that there is some progression of her proximal circumflex, and this might or might not lead to improvement. She recognizes there is weight gain, declining activity, her valve issue, and likely a component of diastolic heart failure. She is concerned about the antiplatelet agents, and potential that it could enhance bleeding with her retinal disease. She would like to defer until after the holidays, until we have had dialogue with [name]. Everlena [name] in the Opthalmology department at [facility], and I will try to make that contact within the next couple of days. She is conflicted about the procedure with all of these factors, and would like to defer. Her lesion does appear to be stable, and given all of her concerns, agree that deferral is best.   Since then, medical management has been tried. The patient has continued to complain of significant dyspnea on exertion. She has not had shortness of breath at rest. She gets abdominal pain in the setting of the  shortness of breath and also feels a pressure or tightness in her throat when she is very short of breath. She has taken nitroglycerin a couple of times. She will also sit down and rest. The symptoms will resolve with the nitroglycerin or with a rest. She has had angina in the past that was chest pain and she has not had those symptoms recently but the shortness of breath with exertion has been ongoing for months and there is concern that part of her dyspnea is related to her coronary artery disease.  Her shortness of breath became significantly worse over the last 48 hours. She denies PND orthopnea but gets significantly short of breath walking room to room. Because of this she called the office and came to the emergency room as instructed. Currently she is not short of breath but she is sitting still. She is not having chest pain.    Past Medical History  Diagnosis Date  . HTN (hypertension)   . Hypothyroidism   . Diabetes mellitus, type 2     insulin dependent  . History of recurrent UTIs   . CAD (coronary artery disease)     s/p CABG x3 in 1993; last cath in 2010  . Scleroderma   . Bilateral carpal tunnel syndrome 1970s  . 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 Followed by [name]. Arbie [name]  . Arthritis   . Anemia   . Claudication   . Aortic stenosis     Moderate per echo in May 2012  . Hyperlipidemia  on Lipitor  . Myocardial infarction 1986  . Seizure     ? seizure like event in 2010; workup included stress testing which led to repeat cath.   . Cat bite 2009    with MRSA; required I & D  . Heart murmur   . Shortness of breath   . Cancer     breast cancer  . Blood transfusion     no reaction from transfusion  . GERD (gastroesophageal reflux disease)      Surgical History:  Past Surgical History  Procedure Date  . Carpal tunnel release   . 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 Vitrectomy   . Tubal ligation   .  Tonsillectomy   . Coronary artery bypass graft 1993    LIMA to LAD, SVG to distal LCX & SVG to Marginal  . Appendectomy   . Amputation     left first and second toes  . Cardiac catheterization  December 2012     Final Conclusions:  1. Elevated right heart pressures consistent with diastolic dysfunction.  2. Continued patency of the internal mammary artery to the distal left anterior descending artery with occlusion of the distal LAD as noted on previous studies. 3-4 septal perforators arise &are patent.  3. High grade stenosis of a nondominant right coronary artery.  4. Progression of disease in the proximal dominant circumflex compared to the previous study of 2010.  5. Occlusion of the saphenous vein graft to the posterior descending branch which is derived from the circumflex vessel with high-grade stenosis of the circumflex portion of the PDA-this is chronic.  6. Continued patency of the saphenous vein graft to a obtuse marginal branch, similar to 2010.  Moderately high grade stenosis of the AV circumflex, but with distal stenoses involving a severely diseased marginal branch not amenable  to PCI  nor is inferior branch      I have reviewed the patient's current medications.  Medication Sig  . aspirin EC 325 MG EC tablet Take 325 mg by mouth daily.    [name] atorvastatin (LIPITOR) 40 MG tablet Take 1 tablet (40 mg total) by mouth at bedtime.  . brimonidine (ALPHAGAN) 0.2 % ophthalmic solution Place 1 drop into both eyes 2 (two) times daily.    Tery Sanfilippo Sodium (COLACE PO) Take 1 capsule by mouth daily as needed. For constipation  . ezetimibe (ZETIA) 10 MG tablet Take 1 tablet (10 mg total) by mouth daily.  . furosemide (LASIX) 20 MG tablet Take 40 mg by mouth daily.    . insulin glargine (LANTUS) 100 UNIT/ML injection Inject 10-22 Units  2 (two) times daily. 22 am, 10 units pm  . insulin lispro (HUMALOG PEN) 100 UNIT/ML injection Inject 0-9 Units into the skin 4 (four) times daily.  DO NOT  GIVE WITHOUT CONSULTING PATIENT ON HER SLIDING SCALE  . isosorbide mononitrate (IMDUR) 30 MG 24 hr tablet Take 1 tablet (30 mg total) by mouth daily.  [name] latanoprost (XALATAN) 0.005 % ophthalmic solution Place 1 drop into both eyes at bedtime.    [name] levothyroxine (SYNTHROID, LEVOTHROID)  Take 125 mcg by mouth daily.    . metoprolol succinate (TOPROL-XL) 25 MG 24 hr tablet Take 1 tablet (25 mg total) by mouth daily.  . nitroGLYCERIN (NITROSTAT) 0.4 MG SL tablet Place 0.4 mg under the tongue every 5 (five) minutes as needed.    . quinapril (ACCUPRIL) 10 MG tablet Take 1 tablet (10 mg total) by mouth at bedtime.    Allergies:  Allergies  Allergen Reactions  . Adhesive (Tape) Other (See Comments)    "takes my skin off"  . Cephalexin Swelling and Rash  . Ciprofloxacin Swelling and Rash    History   Social History  . Marital Status: Single    Spouse Name: N/A    Number of Children: N/A  . Years of Education: N/A   Occupational History  . Environmental health practitioner    Social History Main Topics  . Smoking status: Never Smoker   . Smokeless tobacco: Never Used  . Alcohol Use: No  . Drug Use: No  . Sexually Active: Not Currently    Birth Control/ Protection: Post-menopausal   Social History Narrative  . No narrative on file     Family History  Problem Relation Age of Onset  . Diabetes Mother      Physical Exam: Blood pressure 163/56, pulse 74, temperature 98.3 F (36.8 C), temperature source Oral, resp. rate 18, SpO2 100.00%. General: Well developed, well nourished, white female in no acute distress. Head: Normocephalic, atraumatic, sclera non-icteric, no xanthomas, nares are without discharge.  Neck: No carotid bruits. JVD slightly elevated. No thyromegally Lungs: Clear bilaterally to auscultation in the upper lobes without wheezes or rhonchi. She has some dry rales in the bases but no crackles are noted. Heart: RRR with S1 S2. 2/6 murmur at the upper sternal border,  no rubs, or gallops appreciated. Abdomen: Soft, mild diffuse tenderness, non-distended with normoactive bowel sounds. No hepatomegaly. No rebound/guarding. No obvious abdominal masses. Msk:  Strength and tone appear normal for age. No new joint deformities or effusions, no spine or costo-vertebral angle tenderness. She is status post amputation of part of her left foot but this is well healed Extremities: No clubbing or cyanosis. No edema.  Distal pedal pulses are weak but palpable. Radial pulses are 2+ bilaterally Neuro: Alert and oriented X 3. Moves all extremities spontaneously. No focal deficits noted. Psych:  Responds to questions appropriately with a normal affect. Skin: No rashes or lesions noted  Review of Systems: She has sacral back pain and tenderness. She has other her chronic musculoskeletal aches and pains. She has had 4 episodes of diarrhea within the last 24 hours but this is new for her. She denies melena, hemoptysis or hematemesis. She has not had a productive cough. She denies PND or orthopnea. She has not had lower extremity edema. All other systems reviewed and are otherwise negative except as noted above.  Labs:   Lab Results  Component Value Date   WBC 7.7 10/01/2011   HGB 11.3* 10/01/2011   HCT 34.5* 10/01/2011   MCV 89.8 10/01/2011   PLT 217 10/01/2011   No results found for this basename: INR in the last 72 hours No results found for this basename: NA,K,CL,CO2,BUN,CREATININE,CALCIUM,LABALBU,PROT,BILITOT,ALKPHOS,ALT,AST,GLUCOSE in the last 168 hours No results found for this basename: CKTOTAL:4,CKMB:4,TROPONINI:4 in the last 72 hours Lab Results  Component Value Date   CHOL 114 11/17/2009   HDL 46.10 11/17/2009   LDLCALC 52 11/17/2009   TRIG 81.0 11/17/2009   Pro B Natriuretic peptide (BNP)  Date/Time Value Range Status  09/24/2011  4:47 PM 607.0* 0.0-100.0 (pg/mL) Final  09/21/2011 10:34 AM 514.0* 0.0-100.0 (pg/mL) Final   Lab Results  Component Value Date    DDIMER 0.73* 08/22/2011    Radiology/Studies:  Pending   Echo: May 2012 Study Conclusions - Left ventricle: The cavity size was normal. Wall thickness was normal. Systolic function was normal. The estimated ejection fraction was in the range  of 55% to 60%. There is hypokinesis of the basal inferoseptal myocardium. Features are consistent with a pseudonormal left ventricular filling pattern, with concomitant abnormal relaxation and increased filling pressure (grade 2 diastolic dysfunction). Doppler parameters are consistent with high ventricular filling pressure. - Aortic valve: Valve mobility was restricted. There was moderate stenosis. Mild regurgitation. Mean gradient: 22mm Hg (S). Peak gradient: 36mm Hg (S). - Mitral valve: Mild regurgitation. - Left atrium: The atrium was mildly dilated.  EKG: Pending  ASSESSMENT AND PLAN:  1. Shortness of breath: The shortness of breath is not new but the patient feels that it has worsened recently. Currently, her oxygen saturation is within normal limits on room air. We will check her oxygen saturation with ambulation. We will check a two-view chest x-ray. A right heart catheterization might be beneficial to assess her right-sided pressures. We will continue to cycle cardiac enzymes. [name]. Riley [name] is to see Ms.Enslin in a.m. and advise further.  Signed, Bjorn Loser Barrett PA-C 11/12/2011, 8:00 PM  Attending Note:   The patient was seen and examined.  Agree with assessment and plan as noted above.  Ms. [name] is a very pleasant female with multiple problems including many diabetic complications.  She has known CAD and is now admitted for cath.  She has had progressive dyspnea - thought to be due to at least partially by her severe LCx disease.   She was sent from the office for progressive dyspnea  Lungs : clear Cor : RR Ext: s/p left partial foot amputation.  Imp:    [name]. Gillian Shields to see in the AM to decided on timing of PCI  Alvia Grove., MD, [facility] 11/12/2011, 8:34 PM

## 2011-11-12 NOTE — ED Notes (Signed)
The pt is c/o sob for months getiing worse the past few days.  She needs a coronary artery stent but there have been complications and that has been delayed

## 2011-11-12 NOTE — ED Provider Notes (Signed)
History     CSN: [id number]  Arrival date & time 11/12/11  1818   First MD Initiated Contact with Patient 11/12/11 1927      Chief Complaint  Patient presents with  . Shortness of Breath    (Consider location/radiation/quality/duration/timing/severity/associated sxs/prior treatment) Patient is a 66 y.o. female presenting with shortness of breath. The history is provided by the patient (Patient states that she's becoming increasingly short of breath with exertion. She states she was supposed to get a stent placed in December but ventilated. She spoke with her cardiologist today he stated she can come to the hospital and admitted to have h). No language interpreter was used.  Shortness of Breath  The current episode started more than 2 weeks ago. The onset was gradual. The problem occurs frequently. The problem has been unchanged. The problem is severe. The symptoms are relieved by nothing. The symptoms are aggravated by activity. Associated symptoms include shortness of breath. Pertinent negatives include no chest pain and no cough. There was no intake of a foreign body. The Heimlich maneuver was not attempted. She has not inhaled smoke recently. She has had no prior hospitalizations. She has had no prior ICU admissions. She has had no prior intubations. Her past medical history does not include eczema. Urine output has been normal.    Past Medical History  Diagnosis Date  . HTN (hypertension)   . Hypothyroidism   . Diabetes mellitus, type 2     insulin dependent  . History of recurrent UTIs   . CAD (coronary artery disease)     s/p CABG x3 in 1993; last cath in 2010  . Scleroderma   . Bilateral carpal tunnel syndrome 1970s  . 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 Followed by [name]  . Arthritis   . Anemia   . Claudication   . Aortic stenosis     Moderate per echo in May 2012  . Hyperlipidemia     on Lipitor    . Myocardial infarction 1986  . Seizure     ? seizure like event in 2010; workup included stress testing which led to repeat cath.   . Cat bite 2009    with MRSA; required I & D  . Heart murmur   . Shortness of breath   . Cancer     breast cancer  . Blood transfusion     no reaction from transfusion  . GERD (gastroesophageal reflux disease)     Past Surgical History  Procedure Date  . Carpal tunnel release   . 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 Vitrectomy   . Tubal ligation   . Tonsillectomy   . Coronary artery bypass graft 1993    LIMA to LAD, SVG to distal LCX & SVG to Marginal  . Appendectomy   . Amputation     left first and second toes  . Cardiac catheterization 2010    LIMA to LAD patent yet with occluded distal LAD after graft insertion & retrograde is occluded. 3-4 septal perforators arise &are patent. SVG to a large marginal patent; SVG presumably to the distal dominant LCX is occluded, moderately high grade stenosis of the AV circumflex, but with distal stenoses involving a severely diseased marginal branch not amenable  to PCI  nor is inferior branch     Family History  Problem Relation Age of Onset  . Diabetes Mother     History  Substance Use Topics  . Smoking status: Never Smoker   . Smokeless tobacco: Never Used  . Alcohol Use: No    OB History    Grav Para Term Preterm Abortions TAB SAB Ect Mult Living                  Review of Systems  Constitutional: Negative for fatigue.  HENT: Negative for congestion, sinus pressure and ear discharge.   Eyes: Negative for discharge.  Respiratory: Positive for shortness of breath. Negative for cough.   Cardiovascular: Negative for chest pain.  Gastrointestinal: Negative for abdominal pain and diarrhea.  Genitourinary: Negative for frequency and hematuria.  Musculoskeletal: Negative for back pain.  Skin: Negative for rash.  Neurological: Negative for seizures and headaches.  Hematological: Negative.    Psychiatric/Behavioral: Negative for hallucinations.    Allergies  Adhesive; Cephalexin; and Ciprofloxacin  Home Medications   Current Outpatient Rx  Name Route Sig Dispense Refill  . ASPIRIN EC 325 MG PO TBEC Oral Take 325 mg by mouth daily.      . ATORVASTATIN CALCIUM 40 MG PO TABS Oral Take 1 tablet (40 mg total) by mouth at bedtime. 30 tablet 11    Dispense as written.  [name] BRIMONIDINE TARTRATE 0.2 % OP SOLN Both Eyes Place 1 drop into both eyes 2 (two) times daily.      [name] COLACE PO Oral Take 1 capsule by mouth daily as needed. For constipation    . EZETIMIBE 10 MG PO TABS Oral Take 1 tablet (10 mg total) by mouth daily. 30 tablet 6  . FUROSEMIDE 20 MG PO TABS Oral Take 40 mg by mouth daily.      . INSULIN GLARGINE 100 UNIT/ML [place] SOLN Subcutaneous Inject 10-22 Units into the skin 2 (two) times daily. 22 in am, 10 units in pm    . INSULIN LISPRO (HUMAN) 100 UNIT/ML [place] SOLN Subcutaneous Inject 0-9 Units into the skin 4 (four) times daily. Per sliding scale DO NOT GIVE WITHOUT CONSULTING PATIENT ON HER SLIDING SCALE    . ISOSORBIDE MONONITRATE ER 30 MG PO TB24 Oral Take 1 tablet (30 mg total) by mouth daily. 30 tablet 11  . LATANOPROST 0.005 % OP SOLN Both Eyes Place 1 drop into both eyes at bedtime.      [name] LEVOTHYROXINE SODIUM 125 MCG PO TABS Oral Take 125 mcg by mouth daily.      [name] METOPROLOL SUCCINATE ER 25 MG PO TB24 Oral Take 1 tablet (25 mg total) by mouth daily. 30 tablet 6  . NITROGLYCERIN 0.4 MG SL SUBL Sublingual Place 0.4 mg under the tongue every 5 (five) minutes as needed.      . QUINAPRIL HCL 10 MG PO TABS Oral Take 1 tablet (10 mg total) by mouth at bedtime. 30 tablet 6    BP 163/56  Pulse 74  Temp(Src) 98.3 F (36.8 C) (Oral)  Resp 18  SpO2 100%  Physical Exam  Constitutional: She is oriented to person, place, and time. She appears well-developed.  HENT:  Head: Normocephalic and atraumatic.  Eyes: Conjunctivae and EOM are normal. No scleral icterus.  Neck: Neck  supple. No thyromegaly present.  Cardiovascular: Normal rate and regular rhythm.  Exam reveals no gallop and no friction rub.   No murmur heard. Pulmonary/Chest: No stridor. She has no wheezes. She has no rales. She exhibits no tenderness.  Abdominal: She exhibits no distension. There is no tenderness. There is no rebound.  Musculoskeletal: Normal range of motion.  She exhibits no edema.  Lymphadenopathy:    She has no cervical adenopathy.  Neurological: She is oriented to person, place, and time. Coordination normal.  Skin: No rash noted. No erythema.  Psychiatric: She has a normal mood and affect. Her behavior is normal.    ED Course  Procedures (including critical care time)  Labs Reviewed  CBC - Abnormal; Notable for the following:    RBC 3.69 (*)    Hemoglobin 10.5 (*)    HCT 32.6 (*)    All other components within normal limits  POCT I-STAT TROPONIN I - Abnormal; Notable for the following:    Troponin i, poc 0.19 (*)    All other components within normal limits  DIFFERENTIAL  COMPREHENSIVE METABOLIC PANEL  CK TOTAL AND CKMB   No results found.   1. Dyspnea      Date: 11/12/2011  Rate: 69  Rhythm: normal sinus rhythm  QRS Axis: normal  Intervals: normal  ST/T Wave abnormalities: nonspecific ST changes  Conduction Disutrbances:none  Narrative Interpretation:   Old EKG Reviewed: none available    MDM          Benny Lennert, MD 11/12/11 2016

## 2011-11-12 NOTE — ED Notes (Signed)
Pt reports she took nitro at home over past few days and felt better when she also sat down and rested. PT reported she did not need to take another pill.

## 2011-11-12 NOTE — Progress Notes (Signed)
Labs reviewed.  Patient adamant about own insulin dosing.  NaCl started and will give overnight, and hold LIsinopril and furosemide given high sugar.  Sugar started up yesterday with her abdominal cramping and diahreaa.  I had called and left a message on her machine regarding my conversation with Dr. Everlena Cooper.  He had not seen her in a while, but thought that it would likely be safe for her to get clopidogrel.  He said he would be happy to see her and assess again.    Repeat K is 5.6, and Na is better.  Sugar down just a bit.  Will reassess in am.  Suspect gentle hydration combined with insulin will resolve issue.  Plan PCI for likely Thursday when renal function is optimized.  I will be gone on Wednesday and she wants me to perform.  She is aware of issues regarding risk/benefit with stenting.    [name] 11:24 PM 11/12/2011

## 2011-11-12 NOTE — ED Notes (Signed)
[place] PA at bedside. Pt in no signs of distress.

## 2011-11-12 NOTE — ED Notes (Signed)
Verbal order obtained for repeat BMP and fluids. Repeat BMP drawn currently. PT transported to floor at this time; pt in no distress; A&Ox3; floor RN updated with POC to recheck CBG and call results to [place].

## 2011-11-12 NOTE — ED Notes (Signed)
MD at bedside. 

## 2011-11-12 NOTE — ED Notes (Signed)
PT reports heaviness epigastric area and in throat like she cannot get air.

## 2011-11-13 ENCOUNTER — Inpatient Hospital Stay (HOSPITAL_COMMUNITY): Payer: Medicare Other

## 2011-11-13 ENCOUNTER — Other Ambulatory Visit: Payer: Self-pay

## 2011-11-13 LAB — CBC
HCT: 31.3 % — ABNORMAL LOW (ref 36.0–46.0)
HCT: 31.4 % — ABNORMAL LOW (ref 36.0–46.0)
Hemoglobin: 11.3 g/dL — ABNORMAL LOW (ref 12.0–15.0)
MCH: 28.9 pg (ref 26.0–34.0)
MCHC: 32.7 g/dL (ref 30.0–36.0)
MCHC: 32.9 g/dL (ref 30.0–36.0)
MCV: 87.7 fL (ref 78.0–100.0)
MCV: 86.7 fL (ref 78.0–100.0)
RBC: 3.62 MIL/uL — ABNORMAL LOW (ref 3.87–5.11)
RDW: 13.5 % (ref 11.5–15.5)
RDW: 13.6 % (ref 11.5–15.5)
RDW: 13.6 % (ref 11.5–15.5)
WBC: 6.6 10*3/uL (ref 4.0–10.5)

## 2011-11-13 LAB — GLUCOSE, CAPILLARY
Glucose-Capillary: 373 mg/dL — ABNORMAL HIGH (ref 70–99)
Glucose-Capillary: 412 mg/dL — ABNORMAL HIGH (ref 70–99)

## 2011-11-13 LAB — CARDIAC PANEL(CRET KIN+CKTOT+MB+TROPI)
CK, MB: 3.5 ng/mL (ref 0.3–4.0)
Relative Index: INVALID (ref 0.0–2.5)
Relative Index: INVALID (ref 0.0–2.5)
Relative Index: INVALID (ref 0.0–2.5)
Total CK: 58 U/L (ref 7–177)
Total CK: 60 U/L (ref 7–177)
Total CK: 66 U/L (ref 7–177)
Total CK: 70 U/L (ref 7–177)
Troponin I: 0.71 ng/mL (ref <0.30)
Troponin I: 0.87 ng/mL (ref <0.30)

## 2011-11-13 LAB — CREATININE, SERUM
Creatinine, Ser: 1.39 mg/dL — ABNORMAL HIGH (ref 0.50–1.10)
GFR calc non Af Amer: 39 mL/min — ABNORMAL LOW (ref >90)

## 2011-11-13 LAB — BASIC METABOLIC PANEL
BUN: 36 mg/dL — ABNORMAL HIGH (ref 6–23)
CO2: 24 mEq/L (ref 19–32)
Chloride: 101 mEq/L (ref 96–112)
GFR calc Af Amer: 44 mL/min — ABNORMAL LOW (ref >90)
Potassium: 4.8 mEq/L (ref 3.5–5.1)

## 2011-11-13 LAB — LIPID PANEL
HDL: 33 mg/dL — ABNORMAL LOW (ref >39)
LDL Cholesterol: 42 mg/dL (ref 0–99)
Triglycerides: 49 mg/dL (ref <150)
VLDL: 10 mg/dL (ref 0–40)

## 2011-11-13 LAB — PROTIME-INR: Prothrombin Time: 14.2 seconds (ref 11.6–15.2)

## 2011-11-13 LAB — TSH: TSH: 0.914 u[IU]/mL (ref 0.350–4.500)

## 2011-11-13 MED ORDER — INSULIN GLARGINE 100 UNIT/ML ~~LOC~~ SOLN
10.0000 [IU] | Freq: Every day | SUBCUTANEOUS | Status: DC
  Administered 2011-11-13: 10 [IU] via SUBCUTANEOUS
  Administered 2011-11-14: 5 [IU] via SUBCUTANEOUS
  Administered 2011-11-15 – 2011-11-16 (×2): 10 [IU] via SUBCUTANEOUS

## 2011-11-13 MED ORDER — LISINOPRIL 10 MG PO TABS
10.0000 mg | ORAL_TABLET | Freq: Every day | ORAL | Status: DC
  Administered 2011-11-13 – 2011-11-16 (×5): 10 mg via ORAL
  Filled 2011-11-13 (×7): qty 1

## 2011-11-13 MED ORDER — HEPARIN SOD (PORCINE) IN D5W 100 UNIT/ML IV SOLN
1400.0000 [IU]/h | INTRAVENOUS | Status: DC
  Administered 2011-11-13: 1150 [IU]/h via INTRAVENOUS
  Administered 2011-11-13: 1000 [IU]/h via INTRAVENOUS
  Administered 2011-11-14: 1400 [IU]/h via INTRAVENOUS
  Filled 2011-11-13 (×5): qty 250

## 2011-11-13 MED ORDER — INSULIN ASPART 100 UNIT/ML ~~LOC~~ SOLN
3.0000 [IU] | Freq: Once | SUBCUTANEOUS | Status: AC
  Administered 2011-11-13: 3 [IU] via SUBCUTANEOUS

## 2011-11-13 MED ORDER — INSULIN ASPART 100 UNIT/ML ~~LOC~~ SOLN
0.0000 [IU] | Freq: Every day | SUBCUTANEOUS | Status: DC
  Administered 2011-11-13: 5 [IU] via SUBCUTANEOUS
  Filled 2011-11-13: qty 3

## 2011-11-13 MED ORDER — INSULIN GLARGINE 100 UNIT/ML ~~LOC~~ SOLN: 10.0000 [IU] | Freq: Two times a day (BID) | SUBCUTANEOUS | Status: DC

## 2011-11-13 MED ORDER — ASPIRIN 325 MG PO TABS
325.0000 mg | ORAL_TABLET | Freq: Once | ORAL | Status: AC
  Administered 2011-11-13: 325 mg via ORAL
  Filled 2011-11-13: qty 1

## 2011-11-13 MED ORDER — INSULIN GLARGINE 100 UNIT/ML ~~LOC~~ SOLN
22.0000 [IU] | Freq: Every day | SUBCUTANEOUS | Status: DC
  Administered 2011-11-13 – 2011-11-17 (×4): 22 [IU] via SUBCUTANEOUS
  Filled 2011-11-13: qty 3

## 2011-11-13 MED ORDER — INSULIN ASPART 100 UNIT/ML ~~LOC~~ SOLN
0.0000 [IU] | Freq: Three times a day (TID) | SUBCUTANEOUS | Status: DC
  Administered 2011-11-13: 5 [IU] via SUBCUTANEOUS
  Administered 2011-11-13: 7 [IU] via SUBCUTANEOUS
  Administered 2011-11-13: 3 [IU] via SUBCUTANEOUS
  Administered 2011-11-14: 1 [IU] via SUBCUTANEOUS
  Administered 2011-11-14: 4 [IU] via SUBCUTANEOUS
  Administered 2011-11-14: 2 [IU] via SUBCUTANEOUS
  Administered 2011-11-16 (×2): 5 [IU] via SUBCUTANEOUS
  Administered 2011-11-16: 4 [IU] via SUBCUTANEOUS
  Administered 2011-11-17: 2 [IU] via SUBCUTANEOUS
  Administered 2011-11-17: 5 [IU] via SUBCUTANEOUS
  Filled 2011-11-13: qty 3

## 2011-11-13 MED ORDER — HEPARIN BOLUS VIA INFUSION
4000.0000 [IU] | Freq: Once | INTRAVENOUS | Status: AC
  Administered 2011-11-13: 4000 [IU] via INTRAVENOUS
  Filled 2011-11-13: qty 4000

## 2011-11-13 NOTE — Progress Notes (Signed)
Subjective:  Ms. Friley is stable.  She is feeling ok.  No chest pain.  Overall she feels good.  I reviewed with her in detail for over 45 minutes all of her prior studies, and brought angiographic pictures to her to explain the current situation.  I have also spoken with her in detail regarding the fact that she has extensive distal disease that is likely as much a part of her symptoms as the proximal CFX lesion.  In addition, I spoke two weeks ago with Dr. Everlena Cooper when he returned regarding her retinal issues.  He has not seen her in a while, but thought that likely she would be safe to treat.  I put all of this into a comprehensive picture for her.  She has embraced the concept that improving her exercise routine would reduce the demand side of ischemia, and that might be more productive given the nature of her severe distal LAD and severe distal CFX disease.  However, she has pushed to move ahead with the CFX intervention with all of that [place] understanding.  She is also aware of the metabolic challenges noted, with her cr and Hgb.    Objective:  Vital Signs in the last 24 hours: Temp:  [98.3 F (36.8 C)] 98.3 F (36.8 C) (01/29 0043) Pulse Rate:  [58-74] 60  (01/29 0043) Resp:  [15-22] 20  (01/29 0043) BP: (143-164)/(44-74) 164/74 mmHg (01/29 0043) SpO2:  [100 %] 100 % (01/29 0043) Weight:  [80.604 kg (177 lb 11.2 oz)] 80.604 kg (177 lb 11.2 oz) (01/29 0043)  Intake/Output from previous day: 01/28 0701 - 01/29 0700 In: 850 [P.O.:200; I.V.:650] Out: -    Physical Exam: General: Well developed, well nourished, in no acute distress. Head:  Normocephalic and atraumatic. Lungs: Clear to auscultation and percussion. Heart: Normal S1 and S2.  SEM consistent with AS Pulses: Pulses normal in all 4 extremities. Extremities: No clubbing or cyanosis. No edema. Neurologic: Alert and oriented x 3.    Lab Results:  Basename 11/13/11 0824 11/12/11 2353  WBC 7.5 7.7  HGB 10.3* 11.3*  PLT 279 302      Basename 11/12/11 2353 11/12/11 2238 11/12/11 1936  NA -- 132* 129*  K -- 5.6* 6.0*  CL -- 100 98  CO2 -- 24 23  GLUCOSE -- 466* 563*  BUN -- 36* 34*  CREATININE 1.39* 1.41* --    Basename 11/13/11 1048 11/13/11 0530  TROPONINI 0.81* 0.79*   Hepatic Function Panel  Basename 11/12/11 1936  PROT 6.9  ALBUMIN 3.0*  AST 15  ALT 12  ALKPHOS 108  BILITOT 0.5  BILIDIR --  IBILI --    Basename 11/13/11 0530  CHOL 85   No results found for this basename: PROTIME in the last 72 hours  Imaging: X-ray Chest Pa And Lateral  11/13/2011  *RADIOLOGY REPORT*  Clinical Data: Shortness of breath, cough, history hypertension, diabetes, coronary disease post CABG and MI  CHEST - 2 VIEW  Comparison: 08/22/2011  Findings: Upper normal heart size post CABG. Mediastinal contours and pulmonary vascularity normal. Bibasilar effusions and minimal atelectasis. Chronic peribronchial thickening. No acute pulmonary edema or segmental consolidation. No pneumothorax. Bones diffusely demineralized.  IMPRESSION: Post CABG. Chronic bronchitic changes with bibasilar small pleural effusions atelectasis.  Original Report Authenticated By: Lollie Marrow, M.D.    EKG:  Anterolateral ST and T wave changes suggesting ischemia  Cardiac Studies:    Assessment/Plan:  Patient Active Hospital Problem List: Dyspnea (08/22/2011)   Assessment:  some improved. Less anxious   Plan: Plan to repeat echo at this point.   Unspecified hypothyroidism (02/01/2009)   recheck   HYPERTENSION, UNSPECIFIED (02/01/2009)   Assessment: stable    CAD (02/03/2009)   Assessment: See above discussion   Plan: PCI on Thurs Renal insufficiency (09/23/2011)   Assessment: recheck K and BUN/Cr  Type 2 diabetes mellitus (11/13/2011)   Assessment: sugars are down   Plan: recheck.       [name], MD, [facility], FSCAI 11/13/2011, 1:45 PM

## 2011-11-13 NOTE — Progress Notes (Signed)
Pt on sliding scale for blood sugar coverage. After obtaining lunch and dinner blood sugar results I explained to the patient how much insulin was needed based on sliding scale ordered by MD. Patient did not want to take the amount of insulin ordered. Documented on MAR how much insulin was given and that the patient refused the ordered amount of insulin based on sliding scale. Dion Saucier

## 2011-11-13 NOTE — Progress Notes (Signed)
ANTICOAGULATION CONSULT NOTE - Follow Up Consult  Pharmacy Consult for Heparin Indication: chest pain/ACS  Allergies  Allergen Reactions  . Adhesive (Tape) Other (See Comments)    "takes my skin off"  . Cephalexin Swelling and Rash  . Ciprofloxacin Swelling and Rash    Patient Measurements: Height: 5' 3.5" (161.3 cm) Weight: 177 lb 11.2 oz (80.604 kg) IBW/kg (Calculated) : 53.55  Heparin Dosing Weight: 70Kg  Vital Signs:    Labs:  Basename 11/13/11 1800 11/13/11 1526 11/13/11 1525 11/13/11 1048 11/13/11 0824 11/13/11 0530 11/12/11 2353 11/12/11 2238  HGB -- 10.3* -- -- 10.3* -- -- --  HCT -- 31.4* -- -- 31.3* -- 34.6* --  PLT -- 243 -- -- 279 -- 302 --  APTT -- -- -- -- -- -- 34 --  LABPROT -- -- -- -- -- -- 14.2 --  INR -- -- -- -- -- -- 1.08 --  HEPARINUNFRC 0.35 -- -- -- 0.32 -- -- --  CREATININE -- 1.40* -- -- -- -- 1.39* 1.41*  CKTOTAL -- -- 58 66 -- 60 -- --  CKMB -- -- 3.7 3.9 -- 3.3 -- --  TROPONINI -- -- 0.71* 0.81* -- 0.79* -- --   Estimated Creatinine Clearance: 40.2 ml/min (by C-G formula based on Cr of 1.4).   Assessment: Heparin continues for ACS. Heparin level is therapeutic for goal. Noted plans for PCI Thursday.  Goal of Therapy:  Heparin level 0.3-0.7 units/ml   Plan:  1. Continue Heparin drip at 1150 units/hr, will f/up AM heparin level and CBC.  Haylynn Pha K. Allena Katz, PharmD, BCPS.  Clinical Pharmacist Pager [phone]. 11/13/2011 6:51 PM

## 2011-11-13 NOTE — Clinical Documentation Improvement (Signed)
GENERIC DOCUMENTATION CLARIFICATION QUERY  THIS DOCUMENT IS NOT A PERMANENT PART OF THE MEDICAL RECORD  TO RESPOND TO THE THIS QUERY, FOLLOW THE INSTRUCTIONS BELOW:  1. If needed, update documentation for the patient's encounter via the notes activity.  2. Access this query again and click edit on the In [employer].  3. After updating, or not, click F2 to complete all highlighted (required) fields concerning your review. Select "additional documentation in the medical record" OR "no additional documentation provided".  4. Click Sign note button.  5. The deficiency will fall out of your In Basket *Please let us know if you are not able to complete this workflow by phone or e-mail (listed below).  Please update your documentation within the medical record to reflect your response to this query.                                                                                        11/13/11   Dear Dr.Stuckey / Associates,  In a better effort to capture your patient's severity of illness, reflect appropriate length of stay and utilization of resources, a review of the patient medical record has revealed the following indicators.    Based on your clinical judgment, please clarify and document in a progress note and/or discharge summary the clinical condition associated with the following supporting information:   Possible Clinical Conditions?  _______Hyponatremia  _______Other Condition  _______Cannot Clinically Determine   Supporting Information:  Diagnostics: Sodium on 1/28:   129  Treatment: 1/28:  O.9% NaCl @ 52ml/hr x 8hours  You may use possible, probable, or suspect with inpatient documentation. possible, probable, suspected diagnoses MUST be documented at the time of discharge  Reviewed: No additional documentation noted.  Thank You,  Marciano Sequin,  Clinical Documentation Specialist:  Pager: [phone]  Health Information Management [facility]

## 2011-11-13 NOTE — Progress Notes (Signed)
ANTICOAGULATION CONSULT NOTE - Initial Consult  Pharmacy Consult for Heparin Indication: chest pain/ACS  Allergies  Allergen Reactions  . Adhesive (Tape) Other (See Comments)    "takes my skin off"  . Cephalexin Swelling and Rash  . Ciprofloxacin Swelling and Rash    Patient Measurements: Height: 5' 3.5" (161.3 cm) Weight: 177 lb 11.2 oz (80.604 kg) IBW/kg (Calculated) : 53.55  Heparin Dosing Weight: 70  Vital Signs: Temp: 98.3 F (36.8 C) (01/29 0043) Temp src: Oral (01/29 0043) BP: 164/74 mmHg (01/29 0043) Pulse Rate: 60  (01/29 0043)  Labs:  Alvira Philips 11/13/11 0824 11/13/11 0530 11/12/11 2353 11/12/11 2352 11/12/11 2238 11/12/11 1936  HGB 10.3* -- 11.3* -- -- --  HCT 31.3* -- 34.6* -- -- 32.6*  PLT 279 -- 302 -- -- 266  APTT -- -- 34 -- -- --  LABPROT -- -- 14.2 -- -- --  INR -- -- 1.08 -- -- --  HEPARINUNFRC 0.32 -- -- -- -- --  CREATININE -- -- 1.39* -- 1.41* 1.36*  CKTOTAL -- 60 -- 70 -- 73  CKMB -- 3.3 -- 3.5 -- 3.5  TROPONINI -- 0.79* -- 0.87* -- --   Estimated Creatinine Clearance: 40.5 ml/min (by C-G formula based on Cr of 1.39).  Medical History: Past Medical History  Diagnosis Date  . HTN (hypertension)   . Hypothyroidism   . Diabetes mellitus, type 2     insulin dependent  . History of recurrent UTIs   . CAD (coronary artery disease)     s/p CABG x3 in 1993; last cath in 2010  . Scleroderma   . Bilateral carpal tunnel syndrome 1970s  . 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 Followed by [name]  . Arthritis   . Anemia   . Claudication   . Aortic stenosis     Moderate per echo in May 2012  . Hyperlipidemia     on Lipitor  . Myocardial infarction 1986  . Seizure     ? seizure like event in 2010; workup included stress testing which led to repeat cath.   . Cat bite 2009    with MRSA; required I & D  . Heart murmur   . Shortness of breath   . Cancer     breast cancer  .  Blood transfusion     no reaction from transfusion  . GERD (gastroesophageal reflux disease)     Medications:  Prescriptions prior to admission  Medication Sig Dispense Refill  . aspirin EC 325 MG EC tablet Take 325 mg by mouth daily.        [name] atorvastatin (LIPITOR) 40 MG tablet Take 1 tablet (40 mg total) by mouth at bedtime.  30 tablet  11  . brimonidine (ALPHAGAN) 0.2 % ophthalmic solution Place 1 drop into both eyes 2 (two) times daily.        Tery Sanfilippo Sodium (COLACE PO) Take 1 capsule by mouth daily as needed. For constipation      . ezetimibe (ZETIA) 10 MG tablet Take 1 tablet (10 mg total) by mouth daily.  30 tablet  6  . furosemide (LASIX) 20 MG tablet Take 40 mg by mouth daily.        . insulin glargine (LANTUS) 100 UNIT/ML injection Inject 10-22 Units into the skin 2 (two) times daily. 22 in am, 10 units in pm      . insulin lispro (  HUMALOG PEN) 100 UNIT/ML injection Inject 0-9 Units into the skin 4 (four) times daily. Per sliding scale DO NOT GIVE WITHOUT CONSULTING PATIENT ON HER SLIDING SCALE      . isosorbide mononitrate (IMDUR) 30 MG 24 hr tablet Take 1 tablet (30 mg total) by mouth daily.  30 tablet  11  . latanoprost (XALATAN) 0.005 % ophthalmic solution Place 1 drop into both eyes at bedtime.        [name] levothyroxine (SYNTHROID, LEVOTHROID) 125 MCG tablet Take 125 mcg by mouth daily.        . metoprolol succinate (TOPROL-XL) 25 MG 24 hr tablet Take 1 tablet (25 mg total) by mouth daily.  30 tablet  6  . nitroGLYCERIN (NITROSTAT) 0.4 MG SL tablet Place 0.4 mg under the tongue every 5 (five) minutes as needed.        . quinapril (ACCUPRIL) 10 MG tablet Take 1 tablet (10 mg total) by mouth at bedtime.  30 tablet  6    Assessment: 66 yo female with SOB, elevated troponin and started on IV Heparin at 1000 units/hr.  She is therapeutic with Xa level at 0.32 Iu/ml but most likely reflects bolus as well.  There is no noted bleeding complications with therapy.  She has elevated  troponins and plans for PCI as noted.  Goal of Therapy:  Heparin level 0.3-0.7 units/ml   Plan:  1.)  Will increase IV Heparin to 1150 units/hr 2).  F/U heparin level in 8 hours to confirm therapeutic response.  Nadara Mustard, PharmD., MS Clinical Pharmacist Pager:  [phone] 11/13/2011,9:40 AM

## 2011-11-13 NOTE — Progress Notes (Signed)
Inpatient Diabetes Program Recommendations  AACE/ADA: New Consensus Statement on Inpatient Glycemic Control (2009)  Target Ranges:  Prepandial:   less than 140 mg/dL      Peak postprandial:   less than 180 mg/dL (1-2 hours)      Critically ill patients:  140 - 180 mg/dL   Reason for Visit:Results for INES, REBEL (MRN [id number]) as of 11/13/2011 15:29  Ref. Range 11/12/2011 20:41 11/12/2011 23:16 11/13/2011 02:16 11/13/2011 07:49 11/13/2011 11:54  Glucose-Capillary Latest Range: 70-99 mg/dL 562 (H) 130 (H) 865 (H) 186 (H) 292 (H)    Inpatient Diabetes Program Recommendations Insulin - Meal Coverage: Please add Novolog 6 units with meals to cover carbohydrate intake. HgbA1C: Note A1C was 10.6% indicating poor glycemic control prior to admit.  Note: Will follow.

## 2011-11-13 NOTE — Progress Notes (Signed)
ANTICOAGULATION CONSULT NOTE - Initial Consult  Pharmacy Consult for Heparin Indication: chest pain/ACS  Allergies  Allergen Reactions  . Adhesive (Tape) Other (See Comments)    "takes my skin off"  . Cephalexin Swelling and Rash  . Ciprofloxacin Swelling and Rash    Patient Measurements: Weight: 177 lb 11.2 oz (80.604 kg) Heparin Dosing Weight: 70  Vital Signs: Temp: 98.3 F (36.8 C) (01/29 0043) Temp src: Oral (01/29 0043) BP: 164/74 mmHg (01/29 0043) Pulse Rate: 60  (01/29 0043)  Labs:  Alvira Philips 11/12/11 2353 11/12/11 2352 11/12/11 2238 11/12/11 1936  HGB 11.3* -- -- 10.5*  HCT 34.6* -- -- 32.6*  PLT 302 -- -- 266  APTT 34 -- -- --  LABPROT 14.2 -- -- --  INR 1.08 -- -- --  HEPARINUNFRC -- -- -- --  CREATININE 1.39* -- 1.41* 1.36*  CKTOTAL -- 70 -- 73  CKMB -- 3.5 -- 3.5  TROPONINI -- 0.87* -- --   The CrCl is unknown because both a height and weight (above a minimum accepted value) are required for this calculation.  Medical History: Past Medical History  Diagnosis Date  . HTN (hypertension)   . Hypothyroidism   . Diabetes mellitus, type 2     insulin dependent  . History of recurrent UTIs   . CAD (coronary artery disease)     s/p CABG x3 in 1993; last cath in 2010  . Scleroderma   . Bilateral carpal tunnel syndrome 1970s  . 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 Followed by [name]  . Arthritis   . Anemia   . Claudication   . Aortic stenosis     Moderate per echo in May 2012  . Hyperlipidemia     on Lipitor  . Myocardial infarction 1986  . Seizure     ? seizure like event in 2010; workup included stress testing which led to repeat cath.   . Cat bite 2009    with MRSA; required I & D  . Heart murmur   . Shortness of breath   . Cancer     breast cancer  . Blood transfusion     no reaction from transfusion  . GERD (gastroesophageal reflux disease)     Medications:    Prescriptions prior to admission  Medication Sig Dispense Refill  . aspirin EC 325 MG EC tablet Take 325 mg by mouth daily.        [name] atorvastatin (LIPITOR) 40 MG tablet Take 1 tablet (40 mg total) by mouth at bedtime.  30 tablet  11  . brimonidine (ALPHAGAN) 0.2 % ophthalmic solution Place 1 drop into both eyes 2 (two) times daily.        Tery Sanfilippo Sodium (COLACE PO) Take 1 capsule by mouth daily as needed. For constipation      . ezetimibe (ZETIA) 10 MG tablet Take 1 tablet (10 mg total) by mouth daily.  30 tablet  6  . furosemide (LASIX) 20 MG tablet Take 40 mg by mouth daily.        . insulin glargine (LANTUS) 100 UNIT/ML injection Inject 10-22 Units into the skin 2 (two) times daily. 22 in am, 10 units in pm      . insulin lispro (HUMALOG PEN) 100 UNIT/ML injection Inject 0-9 Units into the skin 4 (four) times daily. Per sliding scale DO NOT GIVE WITHOUT CONSULTING PATIENT ON HER SLIDING SCALE      .  isosorbide mononitrate (IMDUR) 30 MG 24 hr tablet Take 1 tablet (30 mg total) by mouth daily.  30 tablet  11  . latanoprost (XALATAN) 0.005 % ophthalmic solution Place 1 drop into both eyes at bedtime.        [name] levothyroxine (SYNTHROID, LEVOTHROID) 125 MCG tablet Take 125 mcg by mouth daily.        . metoprolol succinate (TOPROL-XL) 25 MG 24 hr tablet Take 1 tablet (25 mg total) by mouth daily.  30 tablet  6  . nitroGLYCERIN (NITROSTAT) 0.4 MG SL tablet Place 0.4 mg under the tongue every 5 (five) minutes as needed.        . quinapril (ACCUPRIL) 10 MG tablet Take 1 tablet (10 mg total) by mouth at bedtime.  30 tablet  6    Assessment: 66 yo female with SOB, elevated troponin for Heparin  Goal of Therapy:  Heparin level 0.3-0.7 units/ml   Plan:  Heparin 4000 units IV bolus, then 1000 units/hr Check heparin level in 6 hours.  Tayler Lassen, Gary Fleet 11/13/2011,1:26 AM

## 2011-11-14 DIAGNOSIS — I359 Nonrheumatic aortic valve disorder, unspecified: Secondary | ICD-10-CM

## 2011-11-14 LAB — GLUCOSE, CAPILLARY
Glucose-Capillary: 222 mg/dL — ABNORMAL HIGH (ref 70–99)
Glucose-Capillary: 156 mg/dL — ABNORMAL HIGH (ref 70–99)
Glucose-Capillary: 153 mg/dL — ABNORMAL HIGH (ref 70–99)
Glucose-Capillary: 171 mg/dL — ABNORMAL HIGH (ref 70–99)

## 2011-11-14 LAB — CBC
HCT: 31.4 % — ABNORMAL LOW (ref 36.0–46.0)
Hemoglobin: 10.3 g/dL — ABNORMAL LOW (ref 12.0–15.0)
MCH: 28.5 pg (ref 26.0–34.0)
MCHC: 32.8 g/dL (ref 30.0–36.0)
Platelets: 241 10*3/uL (ref 150–400)
RDW: 13.7 % (ref 11.5–15.5)

## 2011-11-14 LAB — BASIC METABOLIC PANEL
CO2: 22 mEq/L (ref 19–32)
Chloride: 103 mEq/L (ref 96–112)
GFR calc Af Amer: 46 mL/min — ABNORMAL LOW (ref >90)
Potassium: 4.7 mEq/L (ref 3.5–5.1)
Sodium: 134 mEq/L — ABNORMAL LOW (ref 135–145)

## 2011-11-14 MED ORDER — ASPIRIN EC 325 MG PO TBEC
325.0000 mg | DELAYED_RELEASE_TABLET | Freq: Every day | ORAL | Status: DC
  Administered 2011-11-16 – 2011-11-17 (×2): 325 mg via ORAL
  Filled 2011-11-14 (×3): qty 1

## 2011-11-14 MED ORDER — SODIUM CHLORIDE 0.9 % IJ SOLN: 3.0000 mL | Freq: Two times a day (BID) | INTRAMUSCULAR | Status: DC

## 2011-11-14 MED ORDER — SODIUM CHLORIDE 0.9 % IV SOLN
INTRAVENOUS | Status: DC
  Administered 2011-11-15: 04:00:00 via INTRAVENOUS

## 2011-11-14 MED ORDER — ISOSORBIDE MONONITRATE ER 30 MG PO TB24
30.0000 mg | ORAL_TABLET | Freq: Every day | ORAL | Status: DC
  Administered 2011-11-14 – 2011-11-16 (×3): 30 mg via ORAL
  Filled 2011-11-14 (×5): qty 1

## 2011-11-14 MED ORDER — CHLORHEXIDINE GLUCONATE CLOTH 2 % EX PADS
6.0000 | MEDICATED_PAD | Freq: Every day | CUTANEOUS | Status: DC
  Administered 2011-11-14: 6 via TOPICAL

## 2011-11-14 MED ORDER — MUPIROCIN 2 % EX OINT
1.0000 "application " | TOPICAL_OINTMENT | Freq: Two times a day (BID) | CUTANEOUS | Status: DC
  Administered 2011-11-14 – 2011-11-16 (×5): 1 via NASAL
  Filled 2011-11-14: qty 22

## 2011-11-14 MED ORDER — ASPIRIN 81 MG PO CHEW: 324.0000 mg | CHEWABLE_TABLET | ORAL | Status: DC

## 2011-11-14 MED ORDER — EZETIMIBE 10 MG PO TABS
10.0000 mg | ORAL_TABLET | Freq: Every day | ORAL | Status: DC
  Administered 2011-11-14 – 2011-11-16 (×3): 10 mg via ORAL
  Filled 2011-11-14 (×5): qty 1

## 2011-11-14 MED ORDER — ROSUVASTATIN CALCIUM 20 MG PO TABS: 20.0000 mg | ORAL_TABLET | Freq: Every day | ORAL | Status: DC

## 2011-11-14 MED ORDER — SODIUM CHLORIDE 0.9 % IJ SOLN: 3.0000 mL | INTRAMUSCULAR | Status: DC | PRN

## 2011-11-14 MED ORDER — EZETIMIBE 10 MG PO TABS: 10.0000 mg | ORAL_TABLET | Freq: Every day | ORAL | Status: DC

## 2011-11-14 MED ORDER — ROSUVASTATIN CALCIUM 20 MG PO TABS
20.0000 mg | ORAL_TABLET | Freq: Every day | ORAL | Status: DC
  Administered 2011-11-14 – 2011-11-16 (×3): 20 mg via ORAL
  Filled 2011-11-14 (×5): qty 1

## 2011-11-14 MED ORDER — SODIUM CHLORIDE 0.9 % IV SOLN: 250.0000 mL | INTRAVENOUS | Status: DC | PRN

## 2011-11-14 MED ORDER — ISOSORBIDE MONONITRATE ER 30 MG PO TB24: 30.0000 mg | ORAL_TABLET | Freq: Every day | ORAL | Status: DC

## 2011-11-14 MED FILL — Perflutren Lipid Microsphere IV Susp 6.52 MG/ML: INTRAVENOUS | Qty: 2 | Status: AC

## 2011-11-14 NOTE — Progress Notes (Signed)
Patient refusing to take insulin as ordered. If sliding scale suggest 5 units, patient will only agree to take a certain amount that is always less than suggested amount.

## 2011-11-14 NOTE — Progress Notes (Signed)
*  PRELIMINARY RESULTS* Echocardiogram 2D Echocardiogram has been performed.  [name] [facility] 11/14/2011, 11:24 AM

## 2011-11-14 NOTE — Progress Notes (Signed)
ANTICOAGULATION CONSULT NOTE - Follow Up Consult  Pharmacy Consult for Heparin Indication: chest pain/ACS  Allergies  Allergen Reactions  . Adhesive (Tape) Other (See Comments)    "takes my skin off"  . Cephalexin Swelling and Rash  . Ciprofloxacin Swelling and Rash    Patient Measurements: Height: 5' 3.5" (161.3 cm) Weight: 177 lb 11.2 oz (80.604 kg) IBW/kg (Calculated) : 53.55  Heparin Dosing Weight: 70Kg  Vital Signs: Temp: 96.5 F (35.8 C) (01/30 0614) Temp src: Oral (01/30 0614) BP: 100/63 mmHg (01/30 0614) Pulse Rate: 60  (01/30 0614)  Labs:  Basename 11/14/11 0530 11/13/11 1800 11/13/11 1526 11/13/11 1525 11/13/11 1048 11/13/11 0824 11/13/11 0530 11/12/11 2353 11/12/11 2238  HGB 10.3* -- 10.3* -- -- -- -- -- --  HCT 31.4* -- 31.4* -- -- 31.3* -- -- --  PLT 241 -- 243 -- -- 279 -- -- --  APTT -- -- -- -- -- -- -- 34 --  LABPROT -- -- -- -- -- -- -- 14.2 --  INR -- -- -- -- -- -- -- 1.08 --  HEPARINUNFRC 0.23* 0.35 -- -- -- 0.32 -- -- --  CREATININE -- -- 1.40* -- -- -- -- 1.39* 1.41*  CKTOTAL -- -- -- 58 66 -- 60 -- --  CKMB -- -- -- 3.7 3.9 -- 3.3 -- --  TROPONINI -- -- -- 0.71* 0.81* -- 0.79* -- --   Estimated Creatinine Clearance: 40.2 ml/min (by C-G formula based on Cr of 1.4).   Assessment: Heparin continues for ACS. Heparin level is subtherapeutic. No issues with infusion per RN. Noted plans for PCI Thursday.  Goal of Therapy:  Heparin level 0.3-0.7 units/ml   Plan:  1. Increase heparin to 1300 units/hr.  2. F/u 6 hr heparin level  Christoper Fabian, PharmD, BCPS.  Clinical Pharmacist Pager [phone] 11/14/2011 6:57 AM

## 2011-11-14 NOTE — Progress Notes (Signed)
Inpatient Diabetes Program Recommendations  AACE/ADA: New Consensus Statement on Inpatient Glycemic Control (2009)  Target Ranges:  Prepandial:   less than 140 mg/dL      Peak postprandial:   less than 180 mg/dL (1-2 hours)      Critically ill patients:  140 - 180 mg/dL   Reason for Visit: Elevated A1C and CBG's elevated.   Inpatient Diabetes Program Recommendations Insulin - Meal Coverage: Please add Novolog 6 units with meals to cover carbohydrate intake. HgbA1C: Note A1C was 10.6% indicating poor glycemic control prior to admit.Average CBG=264 mg/dL  Note: Attempted to talk to patient regarding diabetes and elevated A1C.  She stated she was busy helping her friend with the computer at this time and not available to talk at this time. Will need follow up with PCP re. Diabetes and elevated CBG's.  RN notes that patient asked for CBG to be checked after lunch due to feeling low.  CBG was 156 mg/dL, but patient still wanted regular coke to prevent low CBG.  Patient would benefit from outpatient diabetes education.  Will attempt to speak with patient on 11/15/11.

## 2011-11-14 NOTE — Progress Notes (Signed)
Spoke with [name] in pharmacy, Crestor, Zetia, Imdur to be moved to night admininistration per patient request. [name] 11/14/2011

## 2011-11-14 NOTE — Progress Notes (Signed)
Patient refused Zetia, Crestor, and Imdur this am. States she takes these meds at night only. Patient did have these meds last night, but dose time was rescheduled to have theses meds in the am hours. Will follow up with pharmacy. Landis Cassaro, Chrystine Oiler

## 2011-11-14 NOTE — Progress Notes (Signed)
ANTICOAGULATION CONSULT NOTE - Follow Up Consult  Pharmacy Consult for  Heparin Indication: chest pain/ACS  Allergies  Allergen Reactions  . Adhesive (Tape) Other (See Comments)    "takes my skin off"  . Cephalexin Swelling and Rash  . Ciprofloxacin Swelling and Rash    Patient Measurements: Height: 5' 3.5" (161.3 cm) Weight: 177 lb 11.2 oz (80.604 kg) IBW/kg (Calculated) : 53.55  Heparin Dosing Weight: 70 kg  Vital Signs: Temp: 98 F (36.7 C) (01/30 1330) Temp src: Oral (01/30 1330) BP: 110/61 mmHg (01/30 1330) Pulse Rate: 60  (01/30 1330)  Labs:  Basename 11/14/11 1248 11/14/11 0530 11/13/11 1800 11/13/11 1526 11/13/11 1525 11/13/11 1048 11/13/11 0824 11/13/11 0530 11/12/11 2353  HGB -- 10.3* -- 10.3* -- -- -- -- --  HCT -- 31.4* -- 31.4* -- -- 31.3* -- --  PLT -- 241 -- 243 -- -- 279 -- --  APTT -- -- -- -- -- -- -- -- 34  LABPROT -- -- -- -- -- -- -- -- 14.2  INR -- -- -- -- -- -- -- -- 1.08  HEPARINUNFRC 0.31 0.23* 0.35 -- -- -- -- -- --  CREATININE -- 1.36* -- 1.40* -- -- -- -- 1.39*  CKTOTAL -- -- -- -- 58 66 -- 60 --  CKMB -- -- -- -- 3.7 3.9 -- 3.3 --  TROPONINI -- -- -- -- 0.71* 0.81* -- 0.79* --   Estimated Creatinine Clearance: 41.4 ml/min (by C-G formula based on Cr of 1.36).  Assessment:   Heparin level is low therapeutic on 1300 units/hr.   Planning PCI tomorrow.  Goal of Therapy:  Heparin level 0.3-0.7 units/ml   Plan:    Will increase Heparin to 1400 units/hr, to try to keep level at goal.  Next level and CBC in AM.  Dennie Fetters Pager: [phone] 11/14/2011,2:44 PM

## 2011-11-14 NOTE — Progress Notes (Signed)
Subjective:   [name] is a 66 yo with DM and severe CAD ( distal disease and a tight LCx).  Admitted with progressive dyspnea on 11/12/11.  She has done well.  Still short of breath.   Plan is for PCI on Thursday.      [name] aspirin EC  325 mg Oral Daily  . brimonidine  1 drop Both Eyes BID  . ezetimibe  10 mg Oral Daily  . insulin aspart  0-15 Units Subcutaneous TID WC  . insulin aspart  0-5 Units Subcutaneous QHS  . insulin glargine  10 Units Subcutaneous QHS  . insulin glargine  22 Units Subcutaneous Daily  . isosorbide mononitrate  30 mg Oral Daily  . latanoprost  1 drop Both Eyes QHS  . levothyroxine  125 mcg Oral Daily  . lisinopril  10 mg Oral QHS  . metoprolol succinate  25 mg Oral Daily  . rosuvastatin  20 mg Oral Daily  . sodium chloride  3 mL Intravenous Q12H  . DISCONTD: enoxaparin  40 mg Subcutaneous Q24H  . DISCONTD: insulin glargine  10-22 Units Subcutaneous BID      . heparin 1,150 Units/hr (11/13/11 1658)    Objective:  Vital Signs in the last 24 hours: Blood pressure 100/63, pulse 60, temperature 96.5 F (35.8 C), temperature source Oral, resp. rate 16, height 5' 3.5" (1.613 m), weight 177 lb 11.2 oz (80.604 kg), SpO2 96.00%. Temp:  [96.5 F (35.8 C)-97.4 F (36.3 C)] 96.5 F (35.8 C) (01/30 1610) Pulse Rate:  [60-61] 60  (01/30 0614) Resp:  [16-18] 16  (01/30 0614) BP: (100-115)/(59-63) 100/63 mmHg (01/30 0614) SpO2:  [96 %-97 %] 96 % (01/30 0614)  Intake/Output from previous day: 01/29 0701 - 01/30 0700 In: 1109.7 [P.O.:850; I.V.:259.7] Out: -  Intake/Output from this shift:    Physical Exam: The patient is alert and oriented x 3.  The mood and affect are normal.   Skin: warm and dry.  Color is normal.    HEENT:   the sclera are nonicteric.  The mucous membranes are moist.  The carotids are 2+ without bruits.  There is no thyromegaly.  There is no JVD.    Lungs: clear.  The chest wall is non tender.    Heart: regular rate with a normal  S1 and S2.  There are no murmurs, gallops, or rubs. The PMI is not displaced.     Abdomen: good bowel sounds.  There is no guarding or rebound.  There is no hepatosplenomegaly or tenderness.  There are no masses.   Extremities:  no clubbing, cyanosis, or edema.  The legs are without rashes.  The distal pulses are intact.   Neuro:  Cranial nerves II - XII are intact.  Motor and sensory functions are intact.     Lab Results:  Basename 11/14/11 0530 11/13/11 1526  WBC 6.3 6.6  HGB 10.3* 10.3*  PLT 241 243    Basename 11/14/11 0530 11/13/11 1526  NA 134* 133*  K 4.7 4.8  CL 103 101  CO2 22 24  GLUCOSE 284* 362*  BUN 38* 36*  CREATININE 1.36* 1.40*    Basename 11/13/11 1525 11/13/11 1048  TROPONINI 0.71* 0.81*   No results found for this basename: BNP in the last 72 hours Hepatic Function Panel  Basename 11/12/11 1936  PROT 6.9  ALBUMIN 3.0*  AST 15  ALT 12  ALKPHOS 108  BILITOT 0.5  BILIDIR --  IBILI --   Lab Results  Component Value Date   CHOL 85 11/13/2011   HDL 33* 11/13/2011   LDLCALC 42 11/13/2011   LDLDIRECT 132.7 04/22/2007   TRIG 49 11/13/2011   CHOLHDL 2.6 11/13/2011    Basename 11/12/11 2353  INR 1.08   Tele:  NSR.   Assessment/Plan:   1. CAD: for PCI tomorrow.  Orders written . At her request, we will give Lantus 8 units tonight instead of her usual 10 units.     Disposition: cath tomorrow  Vesta Mixer, Montez Hageman., MD, [facility] 11/14/2011, 7:35 AM LOS: Day 2

## 2011-11-15 ENCOUNTER — Encounter (HOSPITAL_COMMUNITY)
Admission: EM | Disposition: A | Discharge status: Home / Self Care | Payer: Self-pay | Source: Ambulatory Visit | Attending: Cardiovascular Disease

## 2011-11-15 ENCOUNTER — Other Ambulatory Visit: Payer: Self-pay

## 2011-11-15 ENCOUNTER — Encounter (HOSPITAL_COMMUNITY): Payer: Self-pay | Admitting: Cardiology

## 2011-11-15 DIAGNOSIS — I498 Other specified cardiac arrhythmias: Secondary | ICD-10-CM

## 2011-11-15 DIAGNOSIS — I2510 Atherosclerotic heart disease of native coronary artery without angina pectoris: Secondary | ICD-10-CM

## 2011-11-15 HISTORY — PX: PERCUTANEOUS CORONARY STENT INTERVENTION (PCI-S): SHX5485

## 2011-11-15 LAB — BASIC METABOLIC PANEL
Chloride: 104 mEq/L (ref 96–112)
GFR calc Af Amer: 53 mL/min — ABNORMAL LOW (ref >90)
Potassium: 4.8 mEq/L (ref 3.5–5.1)

## 2011-11-15 LAB — CBC
HCT: 31.5 % — ABNORMAL LOW (ref 36.0–46.0)
HCT: 27.4 % — ABNORMAL LOW (ref 36.0–46.0)
Hemoglobin: 9.1 g/dL — ABNORMAL LOW (ref 12.0–15.0)
MCH: 29 pg (ref 26.0–34.0)
Platelets: 241 10*3/uL (ref 150–400)
Platelets: 231 10*3/uL (ref 150–400)
RBC: 3.14 MIL/uL — ABNORMAL LOW (ref 3.87–5.11)
RDW: 13.6 % (ref 11.5–15.5)
RDW: 13.7 % (ref 11.5–15.5)
WBC: 6.9 10*3/uL (ref 4.0–10.5)
WBC: 6.2 10*3/uL (ref 4.0–10.5)

## 2011-11-15 LAB — CREATININE, SERUM
Creatinine, Ser: 1.27 mg/dL — ABNORMAL HIGH (ref 0.50–1.10)
GFR calc non Af Amer: 43 mL/min — ABNORMAL LOW (ref >90)

## 2011-11-15 LAB — POCT ACTIVATED CLOTTING TIME
Activated Clotting Time: 237 seconds
Activated Clotting Time: 276 seconds
Activated Clotting Time: 188 seconds
Activated Clotting Time: 177 seconds

## 2011-11-15 LAB — GLUCOSE, CAPILLARY: Glucose-Capillary: 176 mg/dL — ABNORMAL HIGH (ref 70–99)

## 2011-11-15 LAB — PROTIME-INR
INR: 1.02 (ref 0.00–1.49)
Prothrombin Time: 13.6 seconds (ref 11.6–15.2)

## 2011-11-15 LAB — HEPARIN LEVEL (UNFRACTIONATED): Heparin Unfractionated: 0.63 IU/mL (ref 0.30–0.70)

## 2011-11-15 LAB — TYPE AND SCREEN
ABO/RH(D): O NEG
Antibody Screen: NEGATIVE

## 2011-11-15 SURGERY — PERCUTANEOUS CORONARY STENT INTERVENTION (PCI-S)
Anesthesia: LOCAL

## 2011-11-15 MED ORDER — DEXTROSE 5 % IV SOLN
250.0000 mg | INTRAVENOUS | Status: DC
  Filled 2011-11-15 (×2): qty 10

## 2011-11-15 MED ORDER — EPTIFIBATIDE 75 MG/100ML IV SOLN
INTRAVENOUS | Status: AC
  Filled 2011-11-15: qty 100

## 2011-11-15 MED ORDER — ENOXAPARIN SODIUM 40 MG/0.4ML ~~LOC~~ SOLN
40.0000 mg | SUBCUTANEOUS | Status: DC
  Filled 2011-11-15 (×3): qty 0.4

## 2011-11-15 MED ORDER — MIDAZOLAM HCL 2 MG/2ML IJ SOLN
INTRAMUSCULAR | Status: AC
  Filled 2011-11-15: qty 2

## 2011-11-15 MED ORDER — FENTANYL CITRATE 0.05 MG/ML IJ SOLN
INTRAMUSCULAR | Status: AC
  Filled 2011-11-15: qty 2

## 2011-11-15 MED ORDER — EPTIFIBATIDE 75 MG/100ML IV SOLN
INTRAVENOUS | Status: AC
  Administered 2011-11-15: 14:00:00
  Filled 2011-11-15: qty 100

## 2011-11-15 MED ORDER — LIDOCAINE HCL (PF) 1 % IJ SOLN
INTRAMUSCULAR | Status: AC
  Filled 2011-11-15: qty 30

## 2011-11-15 MED ORDER — DOPAMINE-DEXTROSE 3.2-5 MG/ML-% IV SOLN
INTRAVENOUS | Status: AC
  Filled 2011-11-15: qty 250

## 2011-11-15 MED ORDER — NITROGLYCERIN 0.2 MG/ML ON CALL CATH LAB
INTRAVENOUS | Status: AC
  Filled 2011-11-15: qty 1

## 2011-11-15 MED ORDER — SODIUM CHLORIDE 0.9 % IV SOLN
INTRAVENOUS | Status: DC
  Administered 2011-11-15: 14:00:00 via INTRAVENOUS

## 2011-11-15 MED ORDER — HEPARIN (PORCINE) IN NACL 2-0.9 UNIT/ML-% IJ SOLN
INTRAMUSCULAR | Status: AC
  Filled 2011-11-15: qty 2000

## 2011-11-15 NOTE — H&P (View-Only) (Signed)
Subjective:   No chest pain.  Feels ok, perhaps a bit swollen.  I reviewed her echo and cath data with Dr. Bensihmon. I have discussed this with the patient in great detail.  She is pushing to move forward with the hope that this will help.  She did have some extreme shortness of breath on admission, relieved by NTG.  Please see below for analysis.   Objective:  Vital Signs in the last 24 hours: Temp:  [97.7 F (36.5 C)-98 F (36.7 C)] 97.9 F (36.6 C) (01/31 0553) Pulse Rate:  [57-61] 59  (01/31 0553) Resp:  [17-18] 18  (01/30 2158) BP: (101-118)/(60-62) 111/61 mmHg (01/31 0553) SpO2:  [95 %-99 %] 99 % (01/31 0553)  Intake/Output from previous day: 01/30 0701 - 01/31 0700 In: 1103.8 [P.O.:840; I.V.:263.8] Out: -    Physical Exam: General: Well developed, well nourished, in no acute distress. Head:  Normocephalic and atraumatic. Lungs: Clear to auscultation and percussion. Heart: Normal S1 and S2.  No murmur, rubs or gallops.  Pulses: Pulses normal in all 4 extremities. Extremities: No clubbing or cyanosis. No edema. Neurologic: Alert and oriented x 3.    Lab Results:  Basename 11/15/11 0548 11/14/11 0530  WBC 6.9 6.3  HGB 10.3* 10.3*  PLT 241 241    Basename 11/15/11 0548 11/14/11 0530  NA 134* 134*  K 4.8 4.7  CL 104 103  CO2 19 22  GLUCOSE 210* 284*  BUN 37* 38*  CREATININE 1.21* 1.36*    Basename 11/13/11 1525 11/13/11 1048  TROPONINI 0.71* 0.81*   Hepatic Function Panel  Basename 11/12/11 1936  PROT 6.9  ALBUMIN 3.0*  AST 15  ALT 12  ALKPHOS 108  BILITOT 0.5  BILIDIR --  IBILI --    Basename 11/13/11 0530  CHOL 85   No results found for this basename: PROTIME in the last 72 hours  Imaging: No results found.    Cardiac Studies:  Echo reviewed in detail with Dr. Bensihmon.  The aortic valve does open widely with one leaflet, while one is frozen.  EF is down some from the last study, particularly in the IVS distally.  EF is about 30%.  See  below  Assessment/Plan:  Patient Active Hospital Problem List: Dyspnea (08/22/2011)/CAD   Assessment: I think the dyspnea is likely related to her diastolic dysfunction in combination with moderate AS, and reduced septal flow, combined with pulm HTN.  Weather there is a component of circ ischemia is unclear, but the lesion is high grade by angio.  Importantly, the IMA is widely patent, but the LAD is severely disease with multiple apical twigs and collaterals, largely unchanged from the 2010 films.  This cannot be rebypassed, and it seems unlikely that replacing the valve would fix her issue.  Weather or not PCI of the CFX will change things is unclear, as its component.  Nonetheless, she can barely afford to lose this, and it is hard to tell with her symptom complex what is involved.  Our intv strategy will be eptifibatide prior to plavix given the increased risk, and plavix with a successful procedure.  Because of her retinal hemmorhage, would not given a new generation thienopyridine.  She understands the risks are increased, but she is adamant about proceeding.  We have had a lengthy discussion of the risks involved including her renal issues, anemia, need for DAPT, retinal disease, reduced EF, AS.  All play a role.      Plan: PCI today     Renal insufficiency (09/23/2011)   Assessment: Cr is better, but with hydration   Plan: PCI today with careful monitoring of contrast.  Type 2 diabetes mellitus (11/13/2011)   Assessment: modest control which she manages.   Plan: see orders.       Areli Jowett, MD, FACC, FSCAI 11/15/2011, 8:09 AM     

## 2011-11-15 NOTE — Op Note (Signed)
CARDIAC CATH NOTE  Name: [name] MRN: [id number] DOB: [birth year]  Procedure:  1.  Attempted PCI of native av circumflex artery with inability to cross with IVUS, rotational atherectomy, or balloon 2.  Attempted IVUS of CFX pre intervention for interventional planning--no images obtained 3.  Placement of temporary transvenous pacer.  Indication: unstable angina.  Please see my extensive notes.   Procedural Details: The right groin was prepped, draped, and anesthetized with 1% lidocaine. Using the modified Seldinger technique, a 6 Fr sheath was introduced into the right femoral artery.  Weight-based heparin with adjunctive eptifibatide  was given for anticoagulation.  We elected this as opposed to pre procedure clopidogrel in order to avoid clopidogrel if stenting attempts were not successfyul.  Once a therapeutic ACT was achieved, a 6 [place] JL 3.5  guide catheter was inserted.  Images for planning were obtained.  A prowater  coronary guidewire was used to cross the lesion.   Following this, a BSX IVUS catheter was passed to the lesion, but could not cross because of excessive calcium.  We then used a microcross catheter to cross, and a rotofloppy wire was placed for planned possible rotational atherectomy.  A 1.2mm burr was used because of lesion length, and given her AS, to minmize distal embolization.  Aminopyhilline was given as a load to help prevent bradycardia.  Despite this, with the first pass she developed moderate bradycardia.  Because of this, a smart needle was used, and a femoral vein sheath was placed.  A 79F pacing wire was then placed.  After this, we attempted multiple times with rotational atherectomy to cross the lesion, and despite multiple passes, we were unable to get the lesion to yield or even budge.  She had mild hypotension at this point that was dependent on a pacing catheter, and we gave her some time to stabilize.  Given the inability of the lesion to yield,  her LV dysfunction, AS, renal insufficiency, and anemia, Dr. Excell Seltzer and I felt that further attempts at rotational atherectomy were potentially more dangerous than helpful.  A micro catheter also would not go across the rotowire either, and we placed a second wire.  A small over the wire balloon would also not pass.  All catheters were removed.  I elected to stop at this point, and asked Dr. Gildardo Griffes to see her in consult since he did her prior surgery.  She was alert and taken to the holding area in stable condition.    Angios:  Prior to intervention, the CFX was heavily calcified and 80% segmentally diseased.  See old cath report for further data.  The terminal branches are small in caliber and diffusely diseased.  There was not a change in appearance at the completion of the procedure.    Lesion Data: Vessel: CFX Percent stenosis (pre): 80% with heavy calcification TIMI-flow (pre):  3 Stent:  None.  Unable to cross with IVUS, balloon, or rotational atherectomy Percent stenosis (post): 80% TIMI-flow (post): 3  Conclusions:  1.  Unsuccessful attempt at PCI due to inability to cross  Recommendations:  1.  Surgical consult.  This is complex.  She has prior CABG, severe diabetic arteriopathy, and despite a patent LIMA, severe distal disease.  She has LV dysfunction, mild anemia, and CKD II.  SHe also has diastolic heart failure, and moderately severe AS.  The visual images of the echo do not suggest critical AS, hence given the bad distal disease, the attempt at PCI.  We  will await Dr. Timoteo Expose thoughts as there is not an easy solution here.  I have made the patient aware of the results.    [name], MD, [facility], FSCAI 11/15/2011, 12:52 PM

## 2011-11-15 NOTE — Progress Notes (Signed)
Subjective:   No chest pain.  Feels ok, perhaps a bit swollen.  I reviewed her echo and cath data with Dr. Jones Broom. I have discussed this with the patient in great detail.  She is pushing to move forward with the hope that this will help.  She did have some extreme shortness of breath on admission, relieved by NTG.  Please see below for analysis.   Objective:  Vital Signs in the last 24 hours: Temp:  [97.7 F (36.5 C)-98 F (36.7 C)] 97.9 F (36.6 C) (01/31 0553) Pulse Rate:  [57-61] 59  (01/31 0553) Resp:  [17-18] 18  (01/30 2158) BP: (101-118)/(60-62) 111/61 mmHg (01/31 0553) SpO2:  [95 %-99 %] 99 % (01/31 0553)  Intake/Output from previous day: 01/30 0701 - 01/31 0700 In: 1103.8 [P.O.:840; I.V.:263.8] Out: -    Physical Exam: General: Well developed, well nourished, in no acute distress. Head:  Normocephalic and atraumatic. Lungs: Clear to auscultation and percussion. Heart: Normal S1 and S2.  No murmur, rubs or gallops.  Pulses: Pulses normal in all 4 extremities. Extremities: No clubbing or cyanosis. No edema. Neurologic: Alert and oriented x 3.    Lab Results:  Basename 11/15/11 0548 11/14/11 0530  WBC 6.9 6.3  HGB 10.3* 10.3*  PLT 241 241    Basename 11/15/11 0548 11/14/11 0530  NA 134* 134*  K 4.8 4.7  CL 104 103  CO2 19 22  GLUCOSE 210* 284*  BUN 37* 38*  CREATININE 1.21* 1.36*    Basename 11/13/11 1525 11/13/11 1048  TROPONINI 0.71* 0.81*   Hepatic Function Panel  Basename 11/12/11 1936  PROT 6.9  ALBUMIN 3.0*  AST 15  ALT 12  ALKPHOS 108  BILITOT 0.5  BILIDIR --  IBILI --    Basename 11/13/11 0530  CHOL 85   No results found for this basename: PROTIME in the last 72 hours  Imaging: No results found.    Cardiac Studies:  Echo reviewed in detail with Dr. Jones Broom.  The aortic valve does open widely with one leaflet, while one is frozen.  EF is down some from the last study, particularly in the IVS distally.  EF is about 30%.  See  below  Assessment/Plan:  Patient Active Hospital Problem List: Dyspnea (08/22/2011)/CAD   Assessment: I think the dyspnea is likely related to her diastolic dysfunction in combination with moderate AS, and reduced septal flow, combined with pulm HTN.  Weather there is a component of circ ischemia is unclear, but the lesion is high grade by angio.  Importantly, the IMA is widely patent, but the LAD is severely disease with multiple apical twigs and collaterals, largely unchanged from the 2010 films.  This cannot be rebypassed, and it seems unlikely that replacing the valve would fix her issue.  Weather or not PCI of the CFX will change things is unclear, as its component.  Nonetheless, she can barely afford to lose this, and it is hard to tell with her symptom complex what is involved.  Our intv strategy will be eptifibatide prior to plavix given the increased risk, and plavix with a successful procedure.  Because of her retinal hemmorhage, would not given a new generation thienopyridine.  She understands the risks are increased, but she is adamant about proceeding.  We have had a lengthy discussion of the risks involved including her renal issues, anemia, need for DAPT, retinal disease, reduced EF, AS.  All play a role.      Plan: PCI today  Renal insufficiency (09/23/2011)   Assessment: Cr is better, but with hydration   Plan: PCI today with careful monitoring of contrast.  Type 2 diabetes mellitus (11/13/2011)   Assessment: modest control which she manages.   Plan: see orders.       [name], MD, [facility], FSCAI 11/15/2011, 8:09 AM

## 2011-11-15 NOTE — Progress Notes (Signed)
Type II WB resolved.  Now in NSR.  BP back at 140 systolic.  Will check CBC.  Arterial sheath removed by me for about 15 minutes with good hemostasis.  Pacer removed.  Mr. Dareen Piano now holding sheath, and will remove venous sheath.  TCTS consult obtained to consider other options, especially in light of AV disease.  The ability to persist with PTRA is limited by her AS, anemia, CKD, etc.    [name] 5:19 PM 11/15/2011

## 2011-11-15 NOTE — Interval H&P Note (Signed)
History and Physical Interval Note:  11/15/2011 9:20 AM  [name]  has presented today for surgery, with the diagnosis of Chest pain  The various methods of treatment have been discussed with the patient and family. After consideration of risks, benefits and other options for treatment, the patient has consented to  Procedure(s): PERCUTANEOUS CORONARY STENT INTERVENTION (PCI-S) as a surgical intervention .  The patients' history has been reviewed, patient examined, no change in status, stable for surgery.  I have reviewed the patients' chart and labs.  Questions were answered to the patient's satisfaction.    See my notes.  This has been extensively reviewed in detail and discussed with the patient.  She understands that risk is increased, that at present there are not ideal surgical alternatives, and the relationship of her symptoms to our findings.  She wishes to proceed.     [name]

## 2011-11-16 ENCOUNTER — Other Ambulatory Visit: Payer: Self-pay

## 2011-11-16 ENCOUNTER — Ambulatory Visit: Payer: Medicare Other | Admitting: Internal Medicine

## 2011-11-16 LAB — BASIC METABOLIC PANEL
CO2: 20 mEq/L (ref 19–32)
Calcium: 8.6 mg/dL (ref 8.4–10.5)
Creatinine, Ser: 1.34 mg/dL — ABNORMAL HIGH (ref 0.50–1.10)
GFR calc Af Amer: 47 mL/min — ABNORMAL LOW (ref >90)
GFR calc non Af Amer: 40 mL/min — ABNORMAL LOW (ref >90)
Glucose, Bld: 258 mg/dL — ABNORMAL HIGH (ref 70–99)
Potassium: 5.1 mEq/L (ref 3.5–5.1)
Sodium: 135 mEq/L (ref 135–145)

## 2011-11-16 LAB — CBC
MCH: 27.8 pg (ref 26.0–34.0)
MCV: 86.7 fL (ref 78.0–100.0)
Platelets: 244 10*3/uL (ref 150–400)
RDW: 13.8 % (ref 11.5–15.5)
WBC: 6 10*3/uL (ref 4.0–10.5)

## 2011-11-16 LAB — GLUCOSE, CAPILLARY
Glucose-Capillary: 264 mg/dL — ABNORMAL HIGH (ref 70–99)
Glucose-Capillary: 202 mg/dL — ABNORMAL HIGH (ref 70–99)

## 2011-11-16 MED ORDER — LIVING WELL WITH DIABETES BOOK
Freq: Once | Status: AC
  Administered 2011-11-16: 16:00:00
  Filled 2011-11-16: qty 1

## 2011-11-16 MED ORDER — DOCUSATE SODIUM 100 MG PO CAPS
100.0000 mg | ORAL_CAPSULE | Freq: Every day | ORAL | Status: DC
  Filled 2011-11-16: qty 1

## 2011-11-16 MED ORDER — DOCUSATE SODIUM 50 MG/5ML PO LIQD: 50.0000 mg | Freq: Every day | ORAL | Status: DC

## 2011-11-16 MED ORDER — DOCUSATE SODIUM 50 MG/5ML PO LIQD
50.0000 mg | Freq: Every day | ORAL | Status: DC
  Administered 2011-11-16: 50 mg via ORAL
  Filled 2011-11-16 (×2): qty 10

## 2011-11-16 NOTE — Progress Notes (Signed)
Patient ID: [name], female   DOB: 1946-07-10, 66 y.o.   MRN: [id number]                   [address]            [name] [address]          [phone]     Patient has been discussed with Dr. Riley Kill. I've her view the films and patient's history. Medical records is currently looking for the op report from 84. On review of the film,s with the degree of severe distal disease involving all the patient's targets she would be a very poor operative candidate for redo coronary artery bypass graft. In addition she has limited conduit with vein harvested completely from the right leg for the previous bypass and from the left leg for a femoral distal bypass. When the old op report is located only of a formal consult tomorrow.  [name] MD  Beeper [phone] Office [phone]  11/15/11

## 2011-11-16 NOTE — Progress Notes (Signed)
Subjective:  Up and about.  Long discussion regarding procedure, outcomes, possiblitlies.  Surgery is seeing at present.   Objective:  Vital Signs in the last 24 hours: Temp:  [97.3 F (36.3 C)-99 F (37.2 C)] 99 F (37.2 C) (02/01 0700) Pulse Rate:  [40-74] 66  (02/01 0245) Resp:  [12-22] 18  (02/01 0700) BP: (108-147)/(35-74) 116/44 mmHg (02/01 0700) SpO2:  [93 %-100 %] 100 % (02/01 0245) FiO2 (%):  [2 %] 2 % (01/31 1900) Weight:  [78.8 kg (173 lb 11.6 oz)] 78.8 kg (173 lb 11.6 oz) (02/01 0443)  Intake/Output from previous day: 01/31 0701 - 02/01 0700 In: 1099.5 [P.O.:240; I.V.:859.5] Out: -    Physical Exam: General: Well developed, well nourished, in no acute distress. Head:  Normocephalic and atraumatic. Lungs: Clear to auscultation and percussion. Heart: Normal S1 and S2.  SEM Extremities: No clubbing or cyanosis. No edema.  SLight erythema R knee.  No effusion.  Groin stable.   Neurologic: Alert and oriented x 3.    Lab Results:  Basename 11/16/11 0500 11/15/11 1700  WBC 6.0 6.2  HGB 8.6* 9.1*  PLT 244 231    Basename 11/16/11 0500 11/15/11 1400 11/15/11 0548  NA 131* -- 134*  K 5.7* -- 4.8  CL 105 -- 104  CO2 20 -- 19  GLUCOSE 310* -- 210*  BUN 37* -- 37*  CREATININE 1.34* 1.27* --    Basename 11/13/11 1525 11/13/11 1048  TROPONINI 0.71* 0.81*   Hepatic Function Panel No results found for this basename: PROT,ALBUMIN,AST,ALT,ALKPHOS,BILITOT,BILIDIR,IBILI in the last 72 hours No results found for this basename: CHOL in the last 72 hours No results found for this basename: PROTIME in the last 72 hours  Imaging: No results found.  EKG:  Awaiting ECG  Cardiac Studies:    K elevated   ?hemolysis  Assessment/Plan:  Patient Active Hospital Problem List: Dyspnea (08/22/2011)   Assessment: issues discussed   Plan: await surgical opinion  CAD (02/03/2009)   Assessment: discussed with patient    Plan: attempt at rotational atherectomy abandoned see  report Renal insufficiency (09/23/2011)   Assessment: stable   Plan: reckeck K stat. Type 2 diabetes mellitus (11/13/2011)   Assessment: moderate to poor control   Plan: patient has strict protocol.  Appreciate surgical opinion.      [name], MD, [facility], FSCAI 11/16/2011, 8:20 AM    s

## 2011-11-17 DIAGNOSIS — I5033 Acute on chronic diastolic (congestive) heart failure: Principal | ICD-10-CM

## 2011-11-17 LAB — BASIC METABOLIC PANEL
BUN: 40 mg/dL — ABNORMAL HIGH (ref 6–23)
CO2: 22 mEq/L (ref 19–32)
Chloride: 104 mEq/L (ref 96–112)
Creatinine, Ser: 1.45 mg/dL — ABNORMAL HIGH (ref 0.50–1.10)
Potassium: 5.3 mEq/L — ABNORMAL HIGH (ref 3.5–5.1)

## 2011-11-17 LAB — GLUCOSE, CAPILLARY
Glucose-Capillary: 174 mg/dL — ABNORMAL HIGH (ref 70–99)
Glucose-Capillary: 271 mg/dL — ABNORMAL HIGH (ref 70–99)

## 2011-11-17 LAB — CBC
HCT: 26.5 % — ABNORMAL LOW (ref 36.0–46.0)
MCV: 86 fL (ref 78.0–100.0)
Platelets: 213 10*3/uL (ref 150–400)
RBC: 3.08 MIL/uL — ABNORMAL LOW (ref 3.87–5.11)
WBC: 6.4 10*3/uL (ref 4.0–10.5)

## 2011-11-17 MED ORDER — FUROSEMIDE 40 MG PO TABS: 40.0000 mg | ORAL_TABLET | Freq: Every day | ORAL | Status: DC

## 2011-11-17 MED ORDER — FUROSEMIDE 10 MG/ML IJ SOLN
80.0000 mg | Freq: Once | INTRAMUSCULAR | Status: AC
  Administered 2011-11-17: 80 mg via INTRAVENOUS
  Filled 2011-11-17: qty 8

## 2011-11-17 NOTE — Discharge Summary (Signed)
CARDIOLOGY DISCHARGE SUMMARY   Patient ID: [name] MRN: [id number] DOB/AGE: 67-Dec-1947 67 y.o.  Admit date: 11/12/2011 Discharge date: 11/17/2011  Primary Discharge Diagnosis:   . Acute on chronic diastolic heart failure   Secondary Discharge Diagnosis:  Patient Active Problem List  Diagnoses  . Unspecified hypothyroidism  . DYSLIPIDEMIA  . ACQUIRED HEMOLYTIC ANEMIA UNSPECIFIED  . HYPERTENSION, UNSPECIFIED  . CAD  . CAROTID ARTERY STENOSIS, WITHOUT INFARCTION  . PERIPHERAL CIRCULATORY DISORDER  . Dyspnea  . Aortic stenosis  . Renal insufficiency    anemia   . Type 2 diabetes mellitus    hyperkalemia     hyponatremia       Procedures: 2-D echocardiogram, two-view chest x-ray Cath Lab Procedure:  1. Attempted PCI of native av circumflex artery with inability to cross with IVUS, rotational atherectomy, or balloon  2. Attempted IVUS of CFX pre intervention for interventional planning--no images obtained  3. Placement of temporary transvenous pacer.   Hospital Course: [name] is a 66 year old female with a history of coronary artery disease and shortness of breath. She had worsening shortness of breath and came to the hospital where she was admitted for further evaluation and treatment.  She had some elevation in her troponins but her CK-MBs were within normal limits. She was anemic but this is not a new finding. She has known disease of the circumflex and dominant 11/16/2011, Dr. Riley Kill took her back to the lab for attempted percutaneous intervention. He was unable to cross the lesion. Please see the op note for further details. Recommendations are listed below.  Dr Rosalyn Charters Recommendations:  1. Surgical consult. This is complex. She has prior CABG, severe diabetic arteriopathy, and despite a patent LIMA, severe distal disease. She has LV dysfunction, mild anemia, and CKD II. SHe also has diastolic heart failure, and moderately severe AS. The visual images of  the echo do not suggest critical AS, hence given the bad distal disease, the attempt at PCI. We will await Dr. Timoteo Expose thoughts as there is not an easy solution here. I have made the patient aware of the results.   She was seen by Dr. Tyrone Sage: On review of the films, with the degree of severe distal disease involving all the patient's targets she would be a very poor operative candidate for redo coronary artery bypass graft. In addition she has limited conduit with vein harvested completely from the right leg for the previous bypass and from the left leg for a femoral distal bypass.  Because of her history of renal insufficiency and hyperkalemia she was held overnight. She was also felt to have some volume overload and was diuresed with IV Lasix.  On 11/17/2011, she was seen by Dr. Gala Romney. If she responds well to the IV Lasix, she'll be considered stable for discharge later today, to followup as an outpatient.   Labs:   Lab Results  Component Value Date   WBC 6.4 11/17/2011   HGB 8.7* 11/17/2011   HCT 26.5* 11/17/2011   MCV 86.0 11/17/2011   PLT 213 11/17/2011    Lab 11/17/11 0600 11/12/11 1936  NA 134* --  K 5.3* --  CL 104 --  CO2 22 --  BUN 40* --  CREATININE 1.45* --  CALCIUM 9.0 --  PROT -- 6.9  BILITOT -- 0.5  ALKPHOS -- 108  ALT -- 12  AST -- 15  GLUCOSE 203* --   Lab Results  Component Value Date   CKTOTAL 58 11/13/2011  CKMB 3.7 11/13/2011   TROPONINI 0.71* 11/13/2011   Lipid Panel     Component Value Date/Time   CHOL 85 11/13/2011 0530   TRIG 49 11/13/2011 0530   HDL 33* 11/13/2011 0530   CHOLHDL 2.6 11/13/2011 0530   VLDL 10 11/13/2011 0530   LDLCALC 42 11/13/2011 0530   Lab Results  Component Value Date   HGBA1C 10.6* 11/12/2011    Basename 11/15/11 0548  INR 1.02    Radiology: 11/13/2011  IMPRESSION: Post CABG. Chronic bronchitic changes with bibasilar small pleural effusions atelectasis.  EKG: 11/13/2011 Normal sinus rhythm, rate 79 Rightward  axis Anteroseptal infarct , age undetermined T wave inversion in lateral leads, consider ischemia Abnormal ECG  Echo: 11/14/2011 Study Conclusions - Left ventricle: The cavity size was normal. Wall thickness was increased in a pattern of mild LVH. Systolic function was moderately to severely reduced. The estimated ejection fraction was 30%. The apex appears akinetic. The septal wall appears moderate to severely hypokinetic. Features are consistent with a pseudonormal left ventricular filling pattern, with concomitant abnormal relaxation and increased filling pressure (grade 2 diastolic dysfunction). E/medial e' > 15 suggests LV end diastolic pressure at least 20 mmHg.  - Aortic valve: Mean gradient suggests mild AS but calculated AVA is in the severe range. Possible to moderate to severe low gradient AS. Visually valve appears moderately stenosed. Moderately calcified leaflets. Possibly bicuspid valve. Mean gradient: 16mm Hg (S). Peak gradient: 26mm Hg (S). Valve area: 0.8cm^2(VTI). - Mitral valve: Trivial regurgitation.  - Left atrium: The atrium was mildly dilated. - Right ventricle: Poorly visualized. The cavity size was normal. Systolic function was mildly reduced. - Right atrium: The atrium was mildly dilated. - Pulmonary arteries: No complete TR doppler jet so unable to estimate PA systolic pressure. - Systemic veins: The IVC was not visualized. Impressions: - Technically difficult study with poor acoustic windows. Normal LV size with mild LV hypertrophy. EF 30% with apical akinesis and septal moderate to severe hypokinesis. Moderate diastolic dysfunction with evidence for elevated LV filling pressure. The aortic valve appears functionally bicuspid with fused raphi of the left and right cusps. Mild AS by mean gradient, severe by calculated valve area. I suspect AS is probably in the moderate range visually. RV is poorly visualized, appears normal in size with mild systolic  dysfunction.  FOLLOW UP PLANS AND APPOINTMENTS Discharge Orders    Future Appointments: Provider: Department: Dept Phone: Center:   06/17/2012 2:00 PM [facility] [phone] [facility]   06/17/2012 2:30 PM [facility] [phone] [facility]   06/17/2012 3:15 PM Gretta Began, MD [facility] [phone] [facility]  Followup with [name] and Dr. Riley Kill, the office will call    Medication List  As of 11/17/2011  9:14 AM   TAKE these medications         aspirin EC 325 MG tablet   Take 325 mg by mouth daily.      atorvastatin 40 MG tablet   Commonly known as: LIPITOR   Take 1 tablet (40 mg total) by mouth at bedtime.      brimonidine 0.2 % ophthalmic solution   Commonly known as: ALPHAGAN   Place 1 drop into both eyes 2 (two) times daily.      COLACE PO   Take 1 capsule by mouth daily as needed. For constipation      ezetimibe 10 MG tablet   Commonly known as: ZETIA   Take 1 tablet (10 mg total) by mouth daily.  CHANGED furosemide 40 MG tablet   Commonly known as: LASIX   Take 1 tablet (40 mg total) by mouth daily.      HUMALOG PEN 100 UNIT/ML injection   Generic drug: insulin lispro   Inject 0-9 Units into the skin 4 (four) times daily. Per sliding scale DO NOT GIVE WITHOUT CONSULTING PATIENT ON HER SLIDING SCALE      insulin glargine 100 UNIT/ML injection   Commonly known as: LANTUS   Inject 10-22 Units into the skin 2 (two) times daily. 22 in am, 10 units in pm      isosorbide mononitrate 30 MG 24 hr tablet   Commonly known as: IMDUR   Take 1 tablet (30 mg total) by mouth daily.      latanoprost 0.005 % ophthalmic solution   Commonly known as: XALATAN   Place 1 drop into both eyes at bedtime.      levothyroxine 125 MCG tablet   Commonly known as: SYNTHROID, LEVOTHROID   Take 125 mcg by mouth daily.      metoprolol succinate 25 MG 24 hr tablet   Commonly known as: TOPROL-XL   Take 1 tablet (25 mg total) by mouth daily.      nitroGLYCERIN 0.4 MG SL  tablet   Commonly known as: NITROSTAT   Place 0.4 mg under the tongue every 5 (five) minutes as needed.      quinapril 10 MG tablet   Commonly known as: ACCUPRIL   Take 1 tablet (10 mg total) by mouth at bedtime.            BRING ALL MEDICATIONS WITH YOU TO FOLLOW UP APPOINTMENTS  Time spent with patient to include physician time: 48 min Signed: Theodore Demark 11/17/2011, 9:14 AM Co-Sign MD

## 2011-11-17 NOTE — Discharge Summary (Signed)
Patient seen and examined with Rhonda Barrett PA-C. We discussed all aspects of the encounter. I agree with the assessment and plan as stated above. See my note from today for full details.  

## 2011-11-17 NOTE — Progress Notes (Signed)
Heart failure teaching and pamphlet given.  Instructed in tv channels for pt ed.  Pt states she has been taking lasix for some time and doesn't feel she needs further teaching.

## 2011-11-17 NOTE — Progress Notes (Addendum)
CARDIAC REHAB PHASE I   PRE:  Rate/Rhythm: 78 nsr  BP:  Supine:  Sitting: 103/43  Standing:   SaO2: 1005  MODE:  Ambulation: declined 1050-1120 Pt declined ambulation c/o fatigue, weakness and fear to get far from restroom after taking diuretics.  Offered assistance to restroom. Pt ambulated to restroom independently and accepted offer to ambulate in hallway however then began to feel lightheaded and weak.  VSS.   Pt encouraged to purse lip breath.  Pt then offered to ambulate in hallway, however declined when advised to need to wash arms and hands, change gowns per MRSA protocol.   Pt educated on importance of activity and risks of immobility.  Pt given exercise recommendations, encouraged to ambulate in her home with increasing frequency as tolerated.  Pt questioned her ability to participate in outpt cardiac rehab program.  Brochure given. Pt advised of need to increase activity at home prior to beginning program.  Pt instructed in deep breathing and purse lip breathing exercises.  Pt requests referral to [facility].  Understanding verbalized of pt teaching-jr,rn  Dan Europe

## 2011-11-17 NOTE — Progress Notes (Signed)
Subjective:   Underwent attempt at PCI LCX yesterday by Dr. Riley Kill but this was unsuccessful due to severe Ca2+ and inability to cross the lesion. Seen by Dr. Tyrone Sage who felt patient likely not a candidate for AVR and re-do CABG (AS mild). Awaiting previous op note for full consult  She denies any increased in SOB in the past 24 hours.  She states that if surgery does not come to evaluate, she would like to go home because she is having company from out of town visiting. No problem with groin site.  Objective: Vital signs in last 24 hours: Filed Vitals:   11/16/11 2239 11/17/11 0000 11/17/11 0421 11/17/11 0455  BP: 111/42  115/53   Pulse:      Temp:  98.3 F (36.8 C) 98.6 F (37 C)   TempSrc:  Oral Oral   Resp:      Height:      Weight:    174 lb 2.6 oz (79 kg)  SpO2:       Weight change: 7.1 oz (0.2 kg)  Intake/Output Summary (Last 24 hours) at 11/17/11 0708 Last data filed at 11/16/11 2100  Gross per 24 hour  Intake   1560 ml  Output      0 ml  Net   1560 ml   Physical Exam: General: Well developed, well nourished, in no acute distress.  Head: Normocephalic and atraumatic.  Lungs: Clear to auscultation and percussion.  Heart: Normal S1 and S2. SEM  Extremities: No clubbing or cyanosis. +2 pitting edema up to knee bilaterally. Groin site ok Neurologic: Alert and oriented x 3.  Lab Results: Basic Metabolic Panel:  Lab 11/16/11 7846 11/16/11 0500  NA 135 131*  K 5.1 5.7*  CL 106 105  CO2 20 20  GLUCOSE 258* 310*  BUN 37* 37*  CREATININE 1.35* 1.34*  CALCIUM 9.0 8.6  MG -- --  PHOS -- --   Liver Function Tests:  Lab 11/12/11 1936  AST 15  ALT 12  ALKPHOS 108  BILITOT 0.5  PROT 6.9  ALBUMIN 3.0*  CBC:  Lab 11/17/11 0600 11/16/11 0500 11/12/11 1936  WBC 6.4 6.0 --  NEUTROABS -- -- 5.4  HGB 8.7* 8.6* --  HCT 26.5* 26.8* --  MCV 86.0 86.7 --  PLT 213 244 --   Cardiac Enzymes:  Lab 11/13/11 1525 11/13/11 1048 11/13/11 0530  CKTOTAL 58 66 60    CKMB 3.7 3.9 3.3  CKMBINDEX -- -- --  TROPONINI 0.71* 0.81* 0.79*   CBG:  Lab 11/16/11 2235 11/16/11 1741 11/16/11 1236 11/16/11 0843 11/15/11 2015 11/15/11 1631  GLUCAP 197* 202* 264* 261* 176* 165*   Hemoglobin A1C:  Lab 11/12/11 2353  HGBA1C 10.6*   Fasting Lipid Panel:  Lab 11/13/11 0530  CHOL 85  HDL 33*  LDLCALC 42  TRIG 49  CHOLHDL 2.6  LDLDIRECT --   Thyroid Function Tests:  Lab 11/12/11 2353  TSH 0.914  T4TOTAL --  FREET4 --  T3FREE --  THYROIDAB --   Coagulation:  Lab 11/15/11 0548 11/12/11 2353  LABPROT 13.6 14.2  INR 1.02 1.08     Micro Results: Recent Results (from the past 240 hour(s))  MRSA PCR SCREENING     Status: Abnormal   Collection Time   11/13/11 12:57 AM      Component Value Range Status Comment   MRSA by PCR POSITIVE (*) NEGATIVE  Final    Medications: stable Scheduled Meds:   . aspirin EC  325  mg Oral Daily  . brimonidine  1 drop Both Eyes BID  . Chlorhexidine Gluconate Cloth  6 each Topical Q0600  . docusate  50 mg Oral Daily  . enoxaparin  40 mg Subcutaneous Q24H  . ezetimibe  10 mg Oral QHS  . insulin aspart  0-15 Units Subcutaneous TID WC  . insulin aspart  0-5 Units Subcutaneous QHS  . insulin glargine  10 Units Subcutaneous QHS  . insulin glargine  22 Units Subcutaneous Daily  . isosorbide mononitrate  30 mg Oral QHS  . latanoprost  1 drop Both Eyes QHS  . levothyroxine  125 mcg Oral Daily  . lisinopril  10 mg Oral QHS  . living well with diabetes book   Does not apply Once  . metoprolol succinate  25 mg Oral Daily  . mupirocin ointment  1 application Nasal BID  . rosuvastatin  20 mg Oral QHS  . sodium chloride  3 mL Intravenous Q12H  . DISCONTD: docusate  50 mg Oral Daily  . DISCONTD: docusate sodium  100 mg Oral Daily   Continuous Infusions:  PRN Meds:.acetaminophen, ALPRAZolam, nitroGLYCERIN, ondansetron (ZOFRAN) IV, sodium chloride, zolpidem Assessment/Plan:  1. Dyspnea-likely 2/2 aortic stenosis,  diastolic CHF. Start Lasix  2. CAD- await for Dr. Ronney Asters consult because PCI  On 1/31 not successfully crossed.   3.Renal insufficiency: stable  4. Hyperkalemia: resolved  5. Type 2 diabetes mellitus: CBGs 200's, not well-controlled. HbA1c 10.6. Consider increase Lantus or start  Meal coverage or increase sliding scale to resistant  6. Anemia- stable Hb       LOS: 5 days   HO,MICHELE 11/17/2011, 7:08 AM   Patient seen and examined with Dr. Anselm Jungling. We discussed all aspects of the encounter. I agree with the assessment and plan as stated above. Ms. Tegeler is anxious to go home today to see a good friend who is having major surgery. Will give one dose of IV lasix to facilitate diuresis. She can increase her hom dose of lasix to 40 bid for a day or two as needed. F/u with [name] and Riley Kill.  Daniel Bensimhon,MD 8:04 AM

## 2011-11-17 NOTE — Progress Notes (Addendum)
Pt indicates she has voided once a moderate amount since receiving iv lasix @ 1012 am.  Theodore Demark, PA  notified.  Pt indicates she is ready to be discharged.  Noralyn Pick, PA states she agrees and discharge papers being done.  Pt sitting in chair working on computer at present.  Pt wishing to take a shower but PA declined permission.

## 2011-11-21 NOTE — Telephone Encounter (Signed)
I spoke with Dr. Everlena Cooper, and spoke with patient at time of her hospitalization.  He noted he would be happy to see her if need, but had not seen her in several years.  He thought overall fairly low risk for a problem.  Apparently vitrectomy in one eye, and R lens?.  L Lens and no vitreous retinopathy.  We discussed our conversation with her prior to her procedure.  TS

## 2011-11-22 ENCOUNTER — Telehealth: Payer: Self-pay | Admitting: Internal Medicine

## 2011-11-22 DIAGNOSIS — I2510 Atherosclerotic heart disease of native coronary artery without angina pectoris: Secondary | ICD-10-CM

## 2011-11-22 DIAGNOSIS — R0602 Shortness of breath: Secondary | ICD-10-CM

## 2011-11-22 MED FILL — Atropine Sulfate Inj 1 MG/ML: INTRAMUSCULAR | Qty: 1 | Status: AC

## 2011-11-22 MED FILL — Verapamil HCl IV Soln 2.5 MG/ML: INTRAVENOUS | Qty: 4 | Status: AC

## 2011-11-22 MED FILL — Heparin Sodium (Porcine) Inj 1000 Unit/ML: INTRAMUSCULAR | Qty: 20 | Status: AC

## 2011-11-22 NOTE — Telephone Encounter (Signed)
New problem Pt wanted to talk to you about when she should be seen by [name] following her procedure Please call

## 2011-11-23 NOTE — Telephone Encounter (Signed)
Called patient. She states that she is frustrated because no one has called her about follow up.  She states that Dr.Stuckey was unable to open up her coronary artery. She had a surgical consult with Dr.Gerhardt concerning redo bypass vs medication yet she does not have a plan. Advised will discuss with Dr.Ross and Dr.Stuckey and call her back next week.

## 2011-11-26 NOTE — Telephone Encounter (Signed)
LMOM to come to [address] for lab work( non fasting) on 2/13 after 830am and to see Dr.Ross on 2/15 at 945 am.  Advised to call back if appointment time does not work for her.

## 2011-11-26 NOTE — Telephone Encounter (Signed)
Spoke with patient  Rec labs on Wed (CBC, BMET, BNP). F/U in clinic Friday PM. Needs call back for time

## 2011-11-27 ENCOUNTER — Telehealth: Payer: Self-pay | Admitting: Internal Medicine

## 2011-11-27 NOTE — Telephone Encounter (Signed)
FU call: pt returning call to [facility]. Please return pt call.

## 2011-11-27 NOTE — Telephone Encounter (Signed)
Patient agreed to come to lab appointment tomorrow and to see Dr.Ross on Friday.

## 2011-11-28 ENCOUNTER — Telehealth: Payer: Self-pay | Admitting: Internal Medicine

## 2011-11-28 ENCOUNTER — Other Ambulatory Visit: Payer: Medicare Other | Admitting: *Deleted

## 2011-11-28 NOTE — Telephone Encounter (Signed)
New msg Pt said she thinks she had sinus infection and wants something for nausea because of nasal drip. She doesn't have pcp to call. [facility] [place] [phone]

## 2011-11-28 NOTE — Telephone Encounter (Signed)
SPOKE WITH  PT  C/O SINUS INFECTION FOR APPROX WEEK  INSTRUCTED  PT TO GO TO URGENT CARE  TO BE TREATED FOR ABOVE  OR TRY OVER THE COUNTER  MEDS TO  DRY UP  DRAINAGE  AND THAT WOULD ELIMINATE  NAUSEA POSSIBLY  PER PT STATES FEELS TO BAD TO GO ANYWHERE WILL  JUST TRY AND TOUGH  IT OUT.PT AWARE  WILL FORWARD TO DR ROSS FOR REVIEW./CY

## 2011-11-29 ENCOUNTER — Ambulatory Visit (INDEPENDENT_AMBULATORY_CARE_PROVIDER_SITE_OTHER): Payer: Medicare Other | Admitting: *Deleted

## 2011-11-29 DIAGNOSIS — I2510 Atherosclerotic heart disease of native coronary artery without angina pectoris: Secondary | ICD-10-CM | POA: Diagnosis not present

## 2011-11-29 DIAGNOSIS — R0602 Shortness of breath: Secondary | ICD-10-CM

## 2011-11-29 MED ORDER — AZITHROMYCIN 250 MG PO TABS: ORAL_TABLET | ORAL | Status: AC

## 2011-11-29 NOTE — Telephone Encounter (Signed)
[facility] in [name]:  [phone]  Call in Z pac.  5 day. No refills.

## 2011-11-29 NOTE — Telephone Encounter (Signed)
[facility] to [facility] in [place], [place] per pt request.  Pt was notified.

## 2011-11-30 ENCOUNTER — Ambulatory Visit (INDEPENDENT_AMBULATORY_CARE_PROVIDER_SITE_OTHER)
Admission: RE | Admit: 2011-11-30 | Discharge: 2011-11-30 | Disposition: A | Discharge status: Home / Self Care | Payer: Medicare Other | Source: Ambulatory Visit | Attending: Internal Medicine | Admitting: Internal Medicine

## 2011-11-30 ENCOUNTER — Ambulatory Visit (INDEPENDENT_AMBULATORY_CARE_PROVIDER_SITE_OTHER): Payer: Medicare Other | Admitting: Internal Medicine

## 2011-11-30 DIAGNOSIS — I2510 Atherosclerotic heart disease of native coronary artery without angina pectoris: Secondary | ICD-10-CM | POA: Diagnosis not present

## 2011-11-30 DIAGNOSIS — R05 Cough: Secondary | ICD-10-CM

## 2011-11-30 DIAGNOSIS — R0609 Other forms of dyspnea: Secondary | ICD-10-CM | POA: Diagnosis not present

## 2011-11-30 DIAGNOSIS — R0600 Dyspnea, unspecified: Secondary | ICD-10-CM

## 2011-11-30 DIAGNOSIS — R0989 Other specified symptoms and signs involving the circulatory and respiratory systems: Secondary | ICD-10-CM | POA: Diagnosis not present

## 2011-11-30 DIAGNOSIS — I350 Nonrheumatic aortic (valve) stenosis: Secondary | ICD-10-CM

## 2011-11-30 DIAGNOSIS — R0602 Shortness of breath: Secondary | ICD-10-CM | POA: Diagnosis not present

## 2011-11-30 DIAGNOSIS — N289 Disorder of kidney and ureter, unspecified: Secondary | ICD-10-CM | POA: Diagnosis not present

## 2011-11-30 DIAGNOSIS — I359 Nonrheumatic aortic valve disorder, unspecified: Secondary | ICD-10-CM

## 2011-11-30 DIAGNOSIS — R059 Cough, unspecified: Secondary | ICD-10-CM

## 2011-11-30 LAB — CBC WITH DIFFERENTIAL/PLATELET
Basophils Absolute: 0 10*3/uL (ref 0.0–0.2)
Eosinophils Absolute: 0.3 10*3/uL (ref 0.0–0.4)
Hemoglobin: 10.2 g/dL — ABNORMAL LOW (ref 11.1–15.9)
Immature Grans (Abs): 0 10*3/uL (ref 0.0–0.1)
Immature Granulocytes: 0 % (ref 0–2)
Lymphs: 27 % (ref 14–46)
MCHC: 32.1 g/dL (ref 31.5–35.7)
Monocytes: 14 % — ABNORMAL HIGH (ref 4–13)
Neutrophils Absolute: 2.8 10*3/uL (ref 1.8–7.8)
Neutrophils Relative %: 53 % (ref 40–74)

## 2011-11-30 LAB — BASIC METABOLIC PANEL
BUN: 39 mg/dL — ABNORMAL HIGH (ref 8–27)
CO2: 21 mmol/L (ref 20–32)
Calcium: 8.7 mg/dL (ref 8.6–10.2)
Chloride: 96 mmol/L — ABNORMAL LOW (ref 97–108)
GFR calc Af Amer: 36 mL/min/{1.73_m2} — ABNORMAL LOW (ref >59)
Glucose: 313 mg/dL — ABNORMAL HIGH (ref 65–99)
Potassium: 4.3 mmol/L (ref 3.5–5.2)

## 2011-11-30 LAB — PRO B NATRIURETIC PEPTIDE: NT-Pro BNP: 6191 pg/mL — ABNORMAL HIGH (ref 0–124)

## 2011-11-30 NOTE — Progress Notes (Addendum)
HPI Patient is a 66 year old with a history of CAD (sp CABG- cath in December showed patent LIMA to LAD with occlusion of distal LAD; High grade stenosis of nondominant RCA; Signif disease of prox LCx; SVG to L PDA occludedl SVG to OM patent), DM, dyslipidemia, POVD, CRI. The patient underwent attempt of PTCA prox LCx by [name].  This was unsussessful and the procedure was aborted. The patient was evaluated by [name].  He thought she would be a very poor candidate for surgical revascularization.   Since d/c the patient's biggest complaint is that she developed a URI.  Wheezing.  PND.  Anorexia. She called in yesterday and ABX prescribed.  She is feeling some better today.    Allergies  Allergen Reactions  . Adhesive (Tape) Other (See Comments)    "takes my skin off"  . Cephalexin Swelling and Rash  . Ciprofloxacin Swelling and Rash    Current Outpatient Prescriptions  Medication Sig Dispense Refill  . aspirin EC 325 MG EC tablet Take 325 mg by mouth daily.        [name] atorvastatin (LIPITOR) 40 MG tablet Take 1 tablet (40 mg total) by mouth at bedtime.  30 tablet  11  . azithromycin (ZITHROMAX Z-PAK) 250 MG tablet As directed  6 each  0  . brimonidine (ALPHAGAN) 0.2 % ophthalmic solution Place 1 drop into both eyes 2 (two) times daily.        Tery Sanfilippo Sodium (COLACE PO) Take 1 capsule by mouth daily as needed. For constipation      . ezetimibe (ZETIA) 10 MG tablet Take 1 tablet (10 mg total) by mouth daily.  30 tablet  6  . furosemide (LASIX) 40 MG tablet Take 1 tablet (40 mg total) by mouth daily.  44 tablet  11  . insulin glargine (LANTUS) 100 UNIT/ML injection Inject 10-22 Units into the skin 2 (two) times daily. 22 in am, 10 units in pm      . isosorbide mononitrate (IMDUR) 30 MG 24 hr tablet Take 1 tablet (30 mg total) by mouth daily.  30 tablet  11  . latanoprost (XALATAN) 0.005 % ophthalmic solution Place 1 drop into both eyes at bedtime.        [name] levothyroxine (SYNTHROID,  LEVOTHROID) 125 MCG tablet Take 125 mcg by mouth daily.        . metoprolol succinate (TOPROL-XL) 25 MG 24 hr tablet Take 1 tablet (25 mg total) by mouth daily.  30 tablet  6  . nitroGLYCERIN (NITROSTAT) 0.4 MG SL tablet Place 0.4 mg under the tongue every 5 (five) minutes as needed.        . quinapril (ACCUPRIL) 10 MG tablet Take 1 tablet (10 mg total) by mouth at bedtime.  30 tablet  6  . insulin lispro (HUMALOG PEN) 100 UNIT/ML injection Inject 0-9 Units into the skin 4 (four) times daily. Per sliding scale DO NOT GIVE WITHOUT CONSULTING PATIENT ON HER SLIDING SCALE        Past Medical History  Diagnosis Date  . HTN (hypertension)   . Hypothyroidism   . Diabetes mellitus, type 2     insulin dependent  . History of recurrent UTIs   . CAD (coronary artery disease)     s/p CABG x3 in 1993; last cath in 2010  . Scleroderma   . Bilateral carpal tunnel syndrome 1970s  . PVD (peripheral vascular disease)     Toes amputated from left foot and has  had prior bypass on left leg. Followed by [name]  . Arthritis   . Anemia   . Claudication   . Aortic stenosis     Moderate per echo in May 2012  . Hyperlipidemia     on Lipitor  . Myocardial infarction 1986  . Seizure     ? seizure like event in 2010; workup included stress testing which led to repeat cath.   . Cat bite 2009    with MRSA; required I & D  . Heart murmur   . Shortness of breath   . Cancer     breast cancer  . Blood transfusion     no reaction from transfusion  . GERD (gastroesophageal reflux disease)     Past Surgical History  Procedure Date  . Carpal tunnel release   . 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 Vitrectomy   . Tubal ligation   . Tonsillectomy   . Coronary artery bypass graft 1993    LIMA to LAD, SVG to distal LCX & SVG to Marginal  . Appendectomy   . Amputation     left first and second toes  . Cardiac catheterization 2010    LIMA to LAD patent yet with occluded distal LAD after graft insertion & retrograde is  occluded. 3-4 septal perforators arise &are patent. SVG to a large marginal patent; SVG presumably to the distal dominant LCX is occluded, moderately high grade stenosis of the AV circumflex, but with distal stenoses involving a severely diseased marginal branch not amenable  to PCI  nor is inferior branch     Family History  Problem Relation Age of Onset  . Diabetes Mother     History   Social History  . Marital Status: Single    Spouse Name: N/A    Number of Children: N/A  . Years of Education: N/A   Occupational History  . Environmental health practitioner    Social History Main Topics  . Smoking status: Never Smoker   . Smokeless tobacco: Never Used  . Alcohol Use: No  . Drug Use: No  . Sexually Active: Not Currently    Birth Control/ Protection: Post-menopausal   Other Topics Concern  . Not on file   Social History Narrative  . No narrative on file    Review of Systems:  All systems reviewed.  They are negative to the above problem except as previously stated.  Vital Signs: BP 113/54  Pulse 77  Ht 5\' 3"  (1.6 m)  Wt 170 lb (77.111 kg)  BMI 30.11 kg/m2  Physical Exam Patient is in NAD HEENT:  Normocephalic, atraumatic. EOMI, PERRLA.  Neck: JVP is normal. No thyromegaly. No bruits.  Lungs: Wheezes, pops throughout.  Moving air.  No rales. Heart: Regular rate and rhythm. Normal S1, S2. No S3.   Gr II/VI systolic murmur. At base. PMI not displaced.  Abdomen:  Supple, nontender. Normal bowel sounds. No masses. No hepatomegaly.  Extremities:   Good distal pulses throughout. No lower extremity edema.  Musculoskeletal :moving all extremities.  Neuro:   alert and oriented x3.  CN II-XII grossly intact.  EKG:  SR.  77.     Assessment and Plan:

## 2011-11-30 NOTE — Patient Instructions (Signed)
Chest xray at [address] BASEMENT

## 2011-12-01 NOTE — Assessment & Plan Note (Signed)
Current dyspnea appears mostly related to URI. Will order CXR.  Continue ABX.

## 2011-12-01 NOTE — Assessment & Plan Note (Signed)
Patient with recent attempt at intervention on LCx was not successful. Plan for medical Rx.  Patient evaluated by surgery and not felt to be a good candidate because of poor distal vessels. I spent a long time reviewing these findings with the patient.  She was upset that more time had not beeen spent disussing/clarifying long term plans.  She would like to begin cardiac rehab.  She is cutting back on some activity because it makes her too tired. I would keep her on the same regimen for now  I would like to check labs.

## 2011-12-01 NOTE — Assessment & Plan Note (Signed)
I am not convinced it is severe.  Echo suggest mild by gradient.  WIll need to be followed.

## 2011-12-01 NOTE — Assessment & Plan Note (Signed)
-   BMET today 

## 2011-12-06 ENCOUNTER — Telehealth: Payer: Self-pay | Admitting: Internal Medicine

## 2011-12-06 NOTE — Telephone Encounter (Signed)
Cardiac rehab order was faxed to [facility] 1or 2 weeks ago.

## 2011-12-06 NOTE — Telephone Encounter (Signed)
Spoke to T. Stuckey.  He agrees that [name] could benefit from cardiac rehab.  Need to about setting her up for this, if she is willing to do

## 2011-12-28 ENCOUNTER — Other Ambulatory Visit: Payer: Self-pay | Admitting: Surgery

## 2011-12-28 DIAGNOSIS — L259 Unspecified contact dermatitis, unspecified cause: Secondary | ICD-10-CM | POA: Diagnosis not present

## 2011-12-28 DIAGNOSIS — D485 Neoplasm of uncertain behavior of skin: Secondary | ICD-10-CM | POA: Diagnosis not present

## 2012-01-17 ENCOUNTER — Telehealth: Payer: Self-pay | Admitting: Internal Medicine

## 2012-01-17 NOTE — Telephone Encounter (Signed)
Spoke with pt, she is wanting to get another rolling walker. She has severe breathing problems and is going to pay for this walker out of pocket and a script will help save her the sales tax. She has not been back in touch about cardiac rehab because she has been fighting bronchitis for the last 2 months but plans on getting into rehab soon. The order can be faxed to [phone]. Will forward for dr Tenny Craw okay. Please let pt know when done.

## 2012-01-17 NOTE — Telephone Encounter (Signed)
New Problem:     Patient called in wondering if [name] would write her a prescription for a walker.  Please call back.

## 2012-01-20 NOTE — Telephone Encounter (Signed)
Please send in order for roller walker.

## 2012-01-20 NOTE — Telephone Encounter (Signed)
See note

## 2012-01-21 NOTE — Telephone Encounter (Signed)
Left message for pt, order faxed to the number provided.

## 2012-02-18 DIAGNOSIS — E109 Type 1 diabetes mellitus without complications: Secondary | ICD-10-CM | POA: Diagnosis not present

## 2012-02-18 DIAGNOSIS — I10 Essential (primary) hypertension: Secondary | ICD-10-CM | POA: Diagnosis not present

## 2012-02-18 DIAGNOSIS — E039 Hypothyroidism, unspecified: Secondary | ICD-10-CM | POA: Diagnosis not present

## 2012-02-18 DIAGNOSIS — R809 Proteinuria, unspecified: Secondary | ICD-10-CM | POA: Diagnosis not present

## 2012-02-21 ENCOUNTER — Telehealth: Payer: Self-pay | Admitting: Internal Medicine

## 2012-02-21 NOTE — Telephone Encounter (Signed)
New Problem:     Patient would like to be seen by [name] soon and is uncomfortable with scheduling for her earliest appointment availability and does not wish to see any of our PAs of NPs.  Please call back.

## 2012-02-21 NOTE — Telephone Encounter (Signed)
Schedule request, wants to speak with  Dr Tenny Craw nurse.

## 2012-02-22 NOTE — Telephone Encounter (Signed)
I could overbook on Monday at 11:45.

## 2012-02-22 NOTE — Telephone Encounter (Signed)
Spoke with pt, aware to come in Monday at 11:45am

## 2012-02-25 ENCOUNTER — Telehealth: Payer: Self-pay | Admitting: Internal Medicine

## 2012-02-25 ENCOUNTER — Ambulatory Visit: Payer: Medicare Other | Admitting: Internal Medicine

## 2012-02-25 NOTE — Telephone Encounter (Signed)
Called patient and rescheduled her to see Dr.Ross on 5/24 at 945 am.

## 2012-02-25 NOTE — Telephone Encounter (Signed)
New Problem:     Patient's blood sugar was so low that while trying to wait until it came back up she fell asleep and missed her appointment and would like to reschedule.  Please call back.

## 2012-03-07 ENCOUNTER — Ambulatory Visit: Payer: Medicare Other | Admitting: Internal Medicine

## 2012-03-07 ENCOUNTER — Ambulatory Visit (INDEPENDENT_AMBULATORY_CARE_PROVIDER_SITE_OTHER): Payer: Medicare Other | Admitting: Internal Medicine

## 2012-03-07 ENCOUNTER — Encounter: Payer: Self-pay | Admitting: Internal Medicine

## 2012-03-07 VITALS — BP 159/70 | HR 73 | Ht 63.0 in | Wt 172.0 lb

## 2012-03-07 DIAGNOSIS — I2510 Atherosclerotic heart disease of native coronary artery without angina pectoris: Secondary | ICD-10-CM

## 2012-03-07 DIAGNOSIS — R0602 Shortness of breath: Secondary | ICD-10-CM

## 2012-03-07 LAB — BASIC METABOLIC PANEL
GFR: 44.65 mL/min — ABNORMAL LOW (ref >60.00)
Potassium: 4.2 mEq/L (ref 3.5–5.1)
Sodium: 135 mEq/L (ref 135–145)

## 2012-03-07 LAB — CBC WITH DIFFERENTIAL/PLATELET
Basophils Absolute: 0.1 10*3/uL (ref 0.0–0.1)
Basophils Relative: 1 % (ref 0.0–3.0)
Eosinophils Relative: 6.4 % — ABNORMAL HIGH (ref 0.0–5.0)
Hemoglobin: 10 g/dL — ABNORMAL LOW (ref 12.0–15.0)
Lymphocytes Relative: 14.5 % (ref 12.0–46.0)
Monocytes Relative: 10.5 % (ref 3.0–12.0)
Neutro Abs: 4.7 10*3/uL (ref 1.4–7.7)
RBC: 4.47 Mil/uL (ref 3.87–5.11)
WBC: 6.9 10*3/uL (ref 4.5–10.5)

## 2012-03-07 LAB — BRAIN NATRIURETIC PEPTIDE: Pro B Natriuretic peptide (BNP): 814 pg/mL — ABNORMAL HIGH (ref 0.0–100.0)

## 2012-03-07 MED ORDER — OMEPRAZOLE 40 MG PO CPDR: 40.0000 mg | DELAYED_RELEASE_CAPSULE | Freq: Every day | ORAL | Status: DC

## 2012-03-07 MED ORDER — BENZONATATE 100 MG PO CAPS: 100.0000 mg | ORAL_CAPSULE | Freq: Three times a day (TID) | ORAL | Status: AC | PRN

## 2012-03-07 MED ORDER — FUROSEMIDE 20 MG PO TABS: 20.0000 mg | ORAL_TABLET | Freq: Every day | ORAL | Status: DC

## 2012-03-07 NOTE — Progress Notes (Addendum)
HPI HPI Patient is a 66 year old with a history of CAD (sp CABG- cath in December showed patent LIMA to LAD with occlusion of distal LAD; High grade stenosis of nondominant RCA; Signif disease of prox LCx; SVG to L PDA occludedl SVG to OM patent), DM, dyslipidemia, POVD, CRI.  The patient underwent attempt of PTCA prox LCx by [name]. This was unsussessful and the procedure was aborted.  The patient was evaluated by [name]. He thought she would be a very poor candidate for surgical revascularization.   I saw her in clinic in March SInce seen she has been busy with move.  Now working on family tree for a reunion  Once that is done she wants to start cardiac rehab after. Her biggest complaint is chronic dry cough  Associated with SOB.  Denies reflux.  No f/ or purulent sputum No CP. Tr edema  Admits to drinking more. Allergies  Allergen Reactions  . Adhesive (Tape) Other (See Comments)    "takes my skin off"  . Cephalexin Swelling and Rash  . Ciprofloxacin Swelling and Rash    Current Outpatient Prescriptions  Medication Sig Dispense Refill  . aspirin EC 325 MG EC tablet Take 325 mg by mouth daily.        [name] atorvastatin (LIPITOR) 40 MG tablet Take 1 tablet (40 mg total) by mouth at bedtime.  30 tablet  11  . brimonidine (ALPHAGAN) 0.2 % ophthalmic solution Place 1 drop into both eyes 2 (two) times daily.        Tery Sanfilippo Sodium (COLACE PO) Take 1 capsule by mouth daily as needed. For constipation      . ezetimibe (ZETIA) 10 MG tablet Take 1 tablet (10 mg total) by mouth daily.  30 tablet  6  . furosemide (LASIX) 40 MG tablet Take 1 tablet (40 mg total) by mouth daily.  44 tablet  11  . hydrOXYzine (ATARAX/VISTARIL) 10 MG tablet Take 10 mg by mouth 3 (three) times daily as needed.      . insulin glargine (LANTUS) 100 UNIT/ML injection Inject 10-22 Units into the skin 2 (two) times daily. 22 in am, 8  units in pm      . insulin lispro (HUMALOG PEN) 100 UNIT/ML injection Inject 0-9  Units into the skin 4 (four) times daily. Per sliding scale DO NOT GIVE WITHOUT CONSULTING PATIENT ON HER SLIDING SCALE      . isosorbide mononitrate (IMDUR) 30 MG 24 hr tablet Take 1 tablet (30 mg total) by mouth daily.  30 tablet  11  . latanoprost (XALATAN) 0.005 % ophthalmic solution Place 1 drop into both eyes at bedtime.        [name] levothyroxine (SYNTHROID, LEVOTHROID) 125 MCG tablet Take 125 mcg by mouth daily.        . metoprolol succinate (TOPROL-XL) 25 MG 24 hr tablet Take 1 tablet (25 mg total) by mouth daily.  30 tablet  6  . nitroGLYCERIN (NITROSTAT) 0.4 MG SL tablet Place 0.4 mg under the tongue every 5 (five) minutes as needed.        . quinapril (ACCUPRIL) 10 MG tablet Take 1 tablet (10 mg total) by mouth at bedtime.  30 tablet  6  . DISCONTD: nitroGLYCERIN (NITROSTAT) 0.4 MG SL tablet Place 1 tablet (0.4 mg total) under the tongue every 5 (five) minutes as needed for chest pain.  25 tablet  3    Past Medical History  Diagnosis Date  . HTN (hypertension)   .  Hypothyroidism   . Diabetes mellitus, type 2     insulin dependent  . History of recurrent UTIs   . CAD (coronary artery disease)     s/p CABG x3 in 1993; last cath in 2010  . Scleroderma   . Bilateral carpal tunnel syndrome 1970s  . 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 Followed by [name]  . Arthritis   . Anemia   . Claudication   . Aortic stenosis     Moderate per echo in May 2012  . Hyperlipidemia     on Lipitor  . Myocardial infarction 1986  . Seizure     ? seizure like event in 2010; workup included stress testing which led to repeat cath.   . Cat bite 2009    with MRSA; required I & D  . Heart murmur   . Shortness of breath   . Cancer     breast cancer  . Blood transfusion     no reaction from transfusion  . GERD (gastroesophageal reflux disease)     Past Surgical History  Procedure Date  . Carpal tunnel release   . Mastectomy  11/1982  . Vitrectomy   . Tubal ligation   . Tonsillectomy   . Coronary artery bypass graft 1993    LIMA to LAD, SVG to distal LCX & SVG to Marginal  . Appendectomy   . Amputation     left first and second toes  . Cardiac catheterization 2010    LIMA to LAD patent yet with occluded distal LAD after graft insertion & retrograde is occluded. 3-4 septal perforators arise &are patent. SVG to a large marginal patent; SVG presumably to the distal dominant LCX is occluded, moderately high grade stenosis of the AV circumflex, but with distal stenoses involving a severely diseased marginal branch not amenable  to PCI  nor is inferior branch     Family History  Problem Relation Age of Onset  . Diabetes Mother     History   Social History  . Marital Status: Single    Spouse Name: N/A    Number of Children: N/A  . Years of Education: N/A   Occupational History  . Environmental health practitioner    Social History Main Topics  . Smoking status: Never Smoker   . Smokeless tobacco: Never Used  . Alcohol Use: No  . Drug Use: No  . Sexually Active: Not Currently    Birth Control/ Protection: Post-menopausal   Other Topics Concern  . Not on file   Social History Narrative  . No narrative on file    Review of Systems:  All systems reviewed.  They are negative to the above problem except as previously stated.  Vital Signs: BP 159/70  Pulse 73  Ht 5\' 3"  (1.6 m)  Wt 172 lb (78.019 kg)  BMI 30.47 kg/m2  Physical Exam Patient is in NAD HEENT:  Normocephalic, atraumatic. EOMI, PERRLA.  Neck: JVP is below jaw.  No thyromegaly. No bruits.  Lungs: clear to auscultation. No rales no wheezes.  Heart: Regular rate and rhythm. Normal S1, S2. No S3.   No significant murmurs. PMI not displaced.  Abdomen:  Supple, nontender. Normal bowel sounds. No masses. No hepatomegaly.  Extremities:   Good distal pulses throughout. Tr  lower extremity edema.  Musculoskeletal :moving all extremities. S/p  metatarsal amputation Neuro:   alert and oriented x3.  CN II-XII  grossly intact.   Assessment and Plan:  1.  CAD.  Continue on medical therapy.  Unfortunately has diffuse disease that is not amenable to intervnetion.  Have reviewed extensively with [name].  Keep on same regimen.  Will get labs to reassess fluids Will check on rehab.   2.  Dyslipidemia.  Keep on lipitor 3.  CRI  Check labs. 3.  PVOD  FOllowed by [facility] 4.  COugh.  Patient with chronic dry cough.  I have asked her to hold quinipril (though she has been on for years).  I have also given Rx for tessalon peral.    Will Rx with PPI.  Check labs though I am not convinced this is fluid.

## 2012-03-07 NOTE — Patient Instructions (Signed)
Call on 5/30 Thursday to let us know how you are feeling.  Start cardiac rehab  STOP Accupril  Lab work today. We will call you with results.  Return to see Dr.Ross in 3 months

## 2012-03-11 ENCOUNTER — Other Ambulatory Visit: Payer: Self-pay | Admitting: *Deleted

## 2012-03-11 DIAGNOSIS — I509 Heart failure, unspecified: Secondary | ICD-10-CM

## 2012-03-17 ENCOUNTER — Other Ambulatory Visit: Payer: Medicare Other

## 2012-03-18 ENCOUNTER — Other Ambulatory Visit (INDEPENDENT_AMBULATORY_CARE_PROVIDER_SITE_OTHER): Payer: Medicare Other

## 2012-03-18 ENCOUNTER — Other Ambulatory Visit: Payer: Medicare Other

## 2012-03-18 DIAGNOSIS — I509 Heart failure, unspecified: Secondary | ICD-10-CM | POA: Diagnosis not present

## 2012-03-18 LAB — BASIC METABOLIC PANEL
BUN: 25 mg/dL — ABNORMAL HIGH (ref 6–23)
CO2: 26 mEq/L (ref 19–32)
Chloride: 103 mEq/L (ref 96–112)
Creatinine, Ser: 1.4 mg/dL — ABNORMAL HIGH (ref 0.4–1.2)

## 2012-03-28 ENCOUNTER — Other Ambulatory Visit (HOSPITAL_COMMUNITY): Payer: Self-pay

## 2012-03-28 DIAGNOSIS — E785 Hyperlipidemia, unspecified: Secondary | ICD-10-CM

## 2012-03-28 MED ORDER — ATORVASTATIN CALCIUM 40 MG PO TABS: 40.0000 mg | ORAL_TABLET | Freq: Every day | ORAL | Status: DC

## 2012-03-28 NOTE — Telephone Encounter (Signed)
..   Requested Prescriptions   Signed Prescriptions Disp Refills  . atorvastatin (LIPITOR) 40 MG tablet 30 tablet 11    Sig: Take 1 tablet (40 mg total) by mouth at bedtime.    Authorizing Provider: Pricilla Riffle    Ordering User: Christella Hartigan, Keanu Lesniak Judie Petit

## 2012-04-01 ENCOUNTER — Telehealth: Payer: Self-pay | Admitting: Internal Medicine

## 2012-04-01 NOTE — Telephone Encounter (Signed)
New msg Pt said she has a place on her ankle. She feels like it is circulation related. She doesn't think it is an insect bite. She wants to discuss.

## 2012-04-03 ENCOUNTER — Encounter: Payer: Self-pay | Admitting: Physician Assistant

## 2012-04-03 ENCOUNTER — Ambulatory Visit (INDEPENDENT_AMBULATORY_CARE_PROVIDER_SITE_OTHER): Payer: Medicare Other | Admitting: Physician Assistant

## 2012-04-03 ENCOUNTER — Inpatient Hospital Stay (HOSPITAL_COMMUNITY)
Admission: EM | Admit: 2012-04-03 | Discharge: 2012-04-10 | DRG: 603 | Disposition: A | Discharge status: Home / Self Care | Payer: Medicare Other | Source: Ambulatory Visit | Attending: Internal Medicine | Admitting: Internal Medicine

## 2012-04-03 ENCOUNTER — Encounter (HOSPITAL_COMMUNITY): Payer: Self-pay | Admitting: *Deleted

## 2012-04-03 VITALS — BP 97/54 | HR 76 | Ht 63.5 in | Wt 165.0 lb

## 2012-04-03 DIAGNOSIS — D649 Anemia, unspecified: Secondary | ICD-10-CM | POA: Diagnosis present

## 2012-04-03 DIAGNOSIS — N289 Disorder of kidney and ureter, unspecified: Secondary | ICD-10-CM | POA: Diagnosis present

## 2012-04-03 DIAGNOSIS — I2510 Atherosclerotic heart disease of native coronary artery without angina pectoris: Secondary | ICD-10-CM | POA: Diagnosis present

## 2012-04-03 DIAGNOSIS — I509 Heart failure, unspecified: Secondary | ICD-10-CM | POA: Diagnosis present

## 2012-04-03 DIAGNOSIS — I5022 Chronic systolic (congestive) heart failure: Secondary | ICD-10-CM

## 2012-04-03 DIAGNOSIS — E785 Hyperlipidemia, unspecified: Secondary | ICD-10-CM

## 2012-04-03 DIAGNOSIS — L0291 Cutaneous abscess, unspecified: Secondary | ICD-10-CM

## 2012-04-03 DIAGNOSIS — E1029 Type 1 diabetes mellitus with other diabetic kidney complication: Secondary | ICD-10-CM | POA: Diagnosis present

## 2012-04-03 DIAGNOSIS — E039 Hypothyroidism, unspecified: Secondary | ICD-10-CM | POA: Diagnosis present

## 2012-04-03 DIAGNOSIS — L03115 Cellulitis of right lower limb: Secondary | ICD-10-CM

## 2012-04-03 DIAGNOSIS — I359 Nonrheumatic aortic valve disorder, unspecified: Secondary | ICD-10-CM | POA: Diagnosis not present

## 2012-04-03 DIAGNOSIS — M7989 Other specified soft tissue disorders: Secondary | ICD-10-CM | POA: Diagnosis not present

## 2012-04-03 DIAGNOSIS — E1159 Type 2 diabetes mellitus with other circulatory complications: Secondary | ICD-10-CM | POA: Diagnosis not present

## 2012-04-03 DIAGNOSIS — I5042 Chronic combined systolic (congestive) and diastolic (congestive) heart failure: Secondary | ICD-10-CM | POA: Diagnosis present

## 2012-04-03 DIAGNOSIS — I739 Peripheral vascular disease, unspecified: Secondary | ICD-10-CM | POA: Diagnosis present

## 2012-04-03 DIAGNOSIS — E109 Type 1 diabetes mellitus without complications: Secondary | ICD-10-CM | POA: Diagnosis not present

## 2012-04-03 DIAGNOSIS — I5033 Acute on chronic diastolic (congestive) heart failure: Secondary | ICD-10-CM | POA: Diagnosis not present

## 2012-04-03 DIAGNOSIS — R509 Fever, unspecified: Secondary | ICD-10-CM | POA: Diagnosis not present

## 2012-04-03 DIAGNOSIS — N184 Chronic kidney disease, stage 4 (severe): Secondary | ICD-10-CM | POA: Diagnosis present

## 2012-04-03 DIAGNOSIS — Z794 Long term (current) use of insulin: Secondary | ICD-10-CM | POA: Diagnosis not present

## 2012-04-03 DIAGNOSIS — I350 Nonrheumatic aortic (valve) stenosis: Secondary | ICD-10-CM

## 2012-04-03 DIAGNOSIS — L02419 Cutaneous abscess of limb, unspecified: Secondary | ICD-10-CM | POA: Diagnosis not present

## 2012-04-03 DIAGNOSIS — R0989 Other specified symptoms and signs involving the circulatory and respiratory systems: Secondary | ICD-10-CM | POA: Diagnosis not present

## 2012-04-03 DIAGNOSIS — IMO0001 Reserved for inherently not codable concepts without codable children: Secondary | ICD-10-CM | POA: Diagnosis present

## 2012-04-03 DIAGNOSIS — L0390 Cellulitis, unspecified: Secondary | ICD-10-CM | POA: Diagnosis not present

## 2012-04-03 DIAGNOSIS — I10 Essential (primary) hypertension: Secondary | ICD-10-CM | POA: Diagnosis present

## 2012-04-03 DIAGNOSIS — M79609 Pain in unspecified limb: Secondary | ICD-10-CM | POA: Diagnosis not present

## 2012-04-03 DIAGNOSIS — Z8679 Personal history of other diseases of the circulatory system: Secondary | ICD-10-CM

## 2012-04-03 LAB — COMPREHENSIVE METABOLIC PANEL
ALT: 14 U/L (ref 0–35)
CO2: 26 mEq/L (ref 19–32)
Calcium: 8.9 mg/dL (ref 8.4–10.5)
Chloride: 99 mEq/L (ref 96–112)
GFR calc Af Amer: 42 mL/min — ABNORMAL LOW (ref >90)
GFR calc non Af Amer: 36 mL/min — ABNORMAL LOW (ref >90)
Glucose, Bld: 216 mg/dL — ABNORMAL HIGH (ref 70–99)
Sodium: 136 mEq/L (ref 135–145)
Total Bilirubin: 0.6 mg/dL (ref 0.3–1.2)

## 2012-04-03 LAB — CBC
HCT: 32 % — ABNORMAL LOW (ref 36.0–46.0)
MCV: 71.4 fL — ABNORMAL LOW (ref 78.0–100.0)
Platelets: 304 10*3/uL (ref 150–400)
RBC: 4.48 MIL/uL (ref 3.87–5.11)
WBC: 7.6 10*3/uL (ref 4.0–10.5)

## 2012-04-03 LAB — DIFFERENTIAL
Eosinophils Relative: 4 % (ref 0–5)
Lymphocytes Relative: 14 % (ref 12–46)
Lymphs Abs: 1.1 10*3/uL (ref 0.7–4.0)
Monocytes Absolute: 1 10*3/uL (ref 0.1–1.0)
Neutro Abs: 5.2 10*3/uL (ref 1.7–7.7)

## 2012-04-03 LAB — GLUCOSE, CAPILLARY

## 2012-04-03 MED ORDER — VANCOMYCIN HCL IN DEXTROSE 1-5 GM/200ML-% IV SOLN
1000.0000 mg | Freq: Once | INTRAVENOUS | Status: AC
  Administered 2012-04-03: 1000 mg via INTRAVENOUS
  Filled 2012-04-03: qty 200

## 2012-04-03 NOTE — H&P (Signed)
Hospital Admission Note Date: 04/03/2012  Patient name: [name] Medical record number: [id number] Date of birth: 29-Jun-1946 Age: 66 y.o. Gender: female PCP: No primary provider on file.  Medical Service: Internal Medicine Teaching Service - Maurice March  Attending physician:  Dr. Meredith Pel    1st Contact: Dr. Clyde Lundborg    Pager: [phone] 2nd Contact: Dr. Loistine Chance   Pager: [phone] After 5 pm or weekends: 1st Contact:      Pager: [phone] 2nd Contact:      Pager: [phone]  Chief Complaint: RLE redness/pain  History of Present Illness: Patient is a 66 yo woman with h/o uncontrolled type 1 DM, PVD & osteomyelitis who presents with acute onset, progressively worsening RLE erythema since 1 week PTA.  She reports that the right lower leg erythema initiated over her right lateral ankle and has progressed upwards.  Described pain as burning, but has remained of similar intensity for the past week. Pain is accompanied by swelling, which is improved with leg elevation.  She tried some type of cream which she had from an episode of dermatitis a few months ago, which provides relief for 1 hour.  Pain is exacerbated by walking, but she is able to bear weight. Pain rated as 2-3/10 with walking, but 10/10 to touch.  She reports a temperature of 99.4 at home.  She reports pain is similar to when she experienced left foot cellulitis/osteomyelitis with wound culture growing pseudomonas s/p transmetatarsal amputation.  At that time, her foot was gangrenous.    Meds: Current Outpatient Rx  Name Route Sig Dispense Refill  . ASPIRIN EC 325 MG PO TBEC Oral Take 325 mg by mouth daily.      . ATORVASTATIN CALCIUM 40 MG PO TABS Oral Take 1 tablet (40 mg total) by mouth at bedtime. 30 tablet 11    Dispense as written.  [name] BENZONATATE 100 MG PO CAPS Oral Take 100 mg by mouth 3 (three) times daily as needed. For cough    . BRIMONIDINE TARTRATE 0.2 % OP SOLN Both Eyes Place 1 drop into both eyes 2 (two) times daily.      [name]  COLACE PO Oral Take 1 capsule by mouth daily as needed. For constipation    . EZETIMIBE 10 MG PO TABS Oral Take 1 tablet (10 mg total) by mouth daily. 30 tablet 6  . FUROSEMIDE 20 MG PO TABS Oral Take 40-60 mg by mouth daily. Takes 40 mg every other day    . HYDROXYZINE HCL 10 MG PO TABS Oral Take 10 mg by mouth 3 (three) times daily as needed. For itching    . INSULIN GLARGINE 100 UNIT/ML [place] SOLN Subcutaneous Inject 8-22 Units into the skin 2 (two) times daily. 22 in am, 8  units in pm    . INSULIN LISPRO (HUMAN) 100 UNIT/ML [place] SOLN Subcutaneous Inject 0-9 Units into the skin 4 (four) times daily. Per sliding scale DO NOT GIVE WITHOUT CONSULTING PATIENT ON HER SLIDING SCALE    . ISOSORBIDE MONONITRATE ER 30 MG PO TB24 Oral Take 1 tablet (30 mg total) by mouth daily. 30 tablet 11  . LATANOPROST 0.005 % OP SOLN Both Eyes Place 1 drop into both eyes at bedtime.      [name] LEVOTHYROXINE SODIUM 125 MCG PO TABS Oral Take 125 mcg by mouth daily.      [name] METOPROLOL SUCCINATE ER 25 MG PO TB24 Oral Take 1 tablet (25 mg total) by mouth daily. 30 tablet 6  . NITROGLYCERIN 0.4  MG SL SUBL Sublingual Place 0.4 mg under the tongue every 5 (five) minutes as needed. For chest pain    . OMEPRAZOLE 40 MG PO CPDR Oral Take 1 capsule (40 mg total) by mouth daily. 30 capsule 6    Allergies: Allergies as of 04/03/2012 - Review Complete 04/03/2012  Allergen Reaction Noted  . Adhesive (tape) Other (See Comments) 06/11/2011  . Cephalexin Swelling and Rash 02/03/2009  . Ciprofloxacin Swelling and Rash 02/03/2009   Past Medical History  Diagnosis Date  . HTN (hypertension)   . Hypothyroidism   . Diabetes mellitus, type 2     insulin dependent  . History of recurrent UTIs   . CAD (coronary artery disease)     s/p CABG x3 in 1993; last cath in 2010  . Scleroderma   . Bilateral carpal tunnel syndrome 1970s  . 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  Followed by [name]  . Arthritis   . Anemia   . Claudication   . Aortic stenosis     0.8cm2 on echo in 10/2011  . Hyperlipidemia     on Lipitor  . Myocardial infarction 1986  . Seizure     ? seizure like event in 2010; workup included stress testing which led to repeat cath.   . Cat bite 2009    with MRSA; required I & D  . Heart murmur   . Shortness of breath   . Cancer     breast cancer  . Blood transfusion     no reaction from transfusion  . GERD (gastroesophageal reflux disease)    Past Surgical History  Procedure Date  . Carpal tunnel release   . 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 Vitrectomy   . Tubal ligation   . Tonsillectomy   . Coronary artery bypass graft 1993    LIMA to LAD, SVG to distal LCX & SVG to Marginal  . Appendectomy   . Amputation     left first and second toes  . Cardiac catheterization 2010    LIMA to LAD patent yet with occluded distal LAD after graft insertion & retrograde is occluded. 3-4 septal perforators arise &are patent. SVG to a large marginal patent; SVG presumably to the distal dominant LCX is occluded, moderately high grade stenosis of the AV circumflex, but with distal stenoses involving a severely diseased marginal branch not amenable  to PCI  nor is inferior branch    Family History  Problem Relation Age of Onset  . Diabetes Mother    History   Social History  . Marital Status: Single    Spouse Name: N/A    Number of Children: N/A  . Years of Education: N/A   Occupational History  . Environmental health practitioner    Social History Main Topics  . Smoking status: Never Smoker   . Smokeless tobacco: Never Used  . Alcohol Use: No  . Drug Use: No  . Sexually Active: Not Currently    Birth Control/ Protection: Post-menopausal   Other Topics Concern  . Not on file   Social History Narrative  . No narrative on file   Review of Systems: General: no fevers, chills, changes in weight, changes in appetite Skin: no rash HEENT: no blurry  vision, hearing changes, sore throat Pulm: no dyspnea, coughing, wheezing CV: no chest pain, palpitations, +chronic shortness of breath without change Abd: no abdominal pain, nausea/vomiting, diarrhea/constipation GU: no dysuria,  hematuria, polyuria Ext: no arthralgias, myalgias Neuro: no weakness, numbness, or tingling   Physical Exam: Blood pressure 139/68, pulse 66, temperature 98.8 F (37.1 C), temperature source Oral, resp. rate 20, height 5' 3.5" (1.613 m), weight 165 lb (74.844 kg), SpO2 100.00%. 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, erythema of right lateral heel extending up through ankle and lateral lower leg (demarcated with bodypen), full ROM of right ankle, but painful on mvmt, no apparent bulla or exudates or open wound, nor is there any crepitus on exam, skin of b/l feet is diffusely calloused without ulcer Neuro: alert and oriented X3, cranial nerves II-XII grossly intact  Lab results: Basic Metabolic Panel:  Basename 04/03/12 1616  NA 136  K 4.6  CL 99  CO2 26  GLUCOSE 216*  BUN 28*  CREATININE 1.46*  CALCIUM 8.9  MG --  PHOS --   Liver Function Tests:  Basename 04/03/12 1616  AST 21  ALT 14  ALKPHOS 125*  BILITOT 0.6  PROT 7.1  ALBUMIN 3.1*   CBC:  Basename 04/03/12 1616  WBC 7.6  NEUTROABS 5.2  HGB 9.8*  HCT 32.0*  MCV 71.4*  PLT 304   CBG:  Basename 04/03/12 2034  GLUCAP 125*    Assessment & Plan by Problem: 66 yo woman with history of HTN, PVD, DM, AS, CAD, hypothyroidism p/w  #Right LE erythema: Most likely  d/t cellulitis given h/o PVD & uncontrolled DM.  Skin is calloused with high risk of skin breaks. 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 At this time, erythematous region does not appear to be necrotic, nor is there crepitus on exam, thus I suspect that pseudomonas or clostridium is less likely.  No risk factors of pseudomonas at present, which include burn, trauma, recent operation, neutropenia, chronic ulcer, pyoderma or skin graft.    Infection appears superficial, but osteomyelitis cannot be ruled out.  I do not suspect joint involvement based on physical exam. DVT less likely as erythema is anterolateral with no posterior tenderness. -IV Vancomycin, if no improvement or if patient clinically deteriorates, consider imaging with MRI, and may consider adding pseudomonas/anaerobe coverage with zosyn -Will monitor vigilantly given h/o uncontrolled DM and h/o left metatarsal amputation   #Chronic Renal Failure: Patient at baseline Cr, GFR = 36.  Likely d/t h/o uncontrolled DM.   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  Last A1c=10.6 in 10/2011.   -Repeat A1c -Continue home dose insulin and monitor CBGs  #Chronic Microcytic Anemia: Patient's Hb is at baseline, but it does not appear to have been worked up in the past.  May be ACD related to DM, heart disease and renal failure. -Anemia panel with AML -FOBT x3, or until positive -Patient will need to be established with a PCP and have outpatient colonoscopy  #h/o CAD: Stable without chest pain.  Being managed  medically, as intervention on LCx was not successful.  She is not felt to be a good candidate for surgery d/t poor distal vessels.  Most recent LDL = 42 on  11/13/11. -Continue ASA, atorvastatin, metoprolol  #h/o Hypothyrodism: Last TSH on 11/12/11 was wnl (0.914).   -Continue home dose levothyroxine  #Chronic Systolic & Diastolic CHF: Not in acute exacerbation.  Patient's last echo was on 11/14/11 revealing EF of 30-35% with Grade 2 diastolic dysfunction as well as AS.  Patient is followed by [facility] cardiology (Dr. Riley Kill, [name]) -Strict I/Os, Daily weights -Continue lasix at home dose (40mg  every other day, 60mg  every other day)  #VTE: Lovenox   Signed: KAPADIA, Akire Rennert 04/03/2012, 11:03 PM

## 2012-04-03 NOTE — ED Provider Notes (Signed)
History     CSN: [id number]  Arrival date & time 04/03/12  1526   First MD Initiated Contact with Patient 04/03/12 1814      Chief Complaint  Patient presents with  . Leg Swelling  . Leg Pain  . Fever    (Consider location/radiation/quality/duration/timing/severity/associated sxs/prior treatment) Patient is a 66 y.o. female presenting with leg pain and fever. The history is provided by the patient. No language interpreter was used.  Leg Pain  The incident occurred more than 1 week ago. The pain is present in the right leg. The quality of the pain is described as burning. The pain is at a severity of 6/10. The pain is moderate. The pain has been constant since onset. Associated symptoms include inability to bear weight. Pertinent negatives include no loss of motion, no loss of sensation and no tingling. The symptoms are aggravated by palpation and activity.  Fever Primary symptoms of the febrile illness include fever. Primary symptoms do not include nausea or vomiting.   66 year old female coming in with right lower extremity tenderness erythema and low-grade temperatures. States that for the past week she has had increased swelling and tenderness to the right anterior shin. Patient is coming from her cardiologist's office who has been watching the area for the past week. She has no PCP and they would like her to be admitted for IV antibiotics. We talked about the CDU unit and she did not feel like this was adequate and thought that she needed to be admitted for 2 days for IV antibiotics.   Past Medical History  Diagnosis Date  . HTN (hypertension)   . Hypothyroidism   . Diabetes mellitus, type 2     insulin dependent  . History of recurrent UTIs   . CAD (coronary artery disease)     s/p CABG x3 in 1993; last cath in 2010  . Scleroderma   . Bilateral carpal tunnel syndrome 1970s  . PVD (peripheral vascular disease)     Toes amputated from left foot and has had prior bypass on left  leg. Followed by [name]  . Arthritis   . Anemia   . Claudication   . Aortic stenosis     Moderate per echo in May 2012  . Hyperlipidemia     on Lipitor  . Myocardial infarction 1986  . Seizure     ? seizure like event in 2010; workup included stress testing which led to repeat cath.   . Cat bite 2009    with MRSA; required I & D  . Heart murmur   . Shortness of breath   . Cancer     breast cancer  . Blood transfusion     no reaction from transfusion  . GERD (gastroesophageal reflux disease)     Past Surgical History  Procedure Date  . Carpal tunnel release   . 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 Vitrectomy   . Tubal ligation   . Tonsillectomy   . Coronary artery bypass graft 1993    LIMA to LAD, SVG to distal LCX & SVG to Marginal  . Appendectomy   . Amputation     left first and second toes  . Cardiac catheterization 2010    LIMA to LAD patent yet with occluded distal LAD after graft insertion & retrograde is occluded. 3-4 septal perforators arise &are patent. SVG to a large marginal patent; SVG presumably to the distal dominant LCX is occluded, moderately high grade stenosis of the AV circumflex, but  with distal stenoses involving a severely diseased marginal branch not amenable  to PCI  nor is inferior branch     Family History  Problem Relation Age of Onset  . Diabetes Mother     History  Substance Use Topics  . Smoking status: Never Smoker   . Smokeless tobacco: Never Used  . Alcohol Use: No    OB History    Grav Para Term Preterm Abortions TAB SAB Ect Mult Living                  Review of Systems  Constitutional: Positive for fever.  HENT: Negative.   Eyes: Negative.   Respiratory: Negative.   Cardiovascular: Negative.   Gastrointestinal: Negative.  Negative for nausea and vomiting.  Musculoskeletal: Positive for gait problem.  Skin:       RLE tenderness  Neurological: Negative.  Negative for dizziness and tingling.  Psychiatric/Behavioral: Negative.     All other systems reviewed and are negative.    Allergies  Adhesive; Cephalexin; and Ciprofloxacin  Home Medications   Current Outpatient Rx  Name Route Sig Dispense Refill  . ASPIRIN EC 325 MG PO TBEC Oral Take 325 mg by mouth daily.      . ATORVASTATIN CALCIUM 40 MG PO TABS Oral Take 1 tablet (40 mg total) by mouth at bedtime. 30 tablet 11    Dispense as written.  [name] BRIMONIDINE TARTRATE 0.2 % OP SOLN Both Eyes Place 1 drop into both eyes 2 (two) times daily.      [name] COLACE PO Oral Take 1 capsule by mouth daily as needed. For constipation    . EZETIMIBE 10 MG PO TABS Oral Take 1 tablet (10 mg total) by mouth daily. 30 tablet 6  . FUROSEMIDE 20 MG PO TABS Oral Take 1 tablet (20 mg total) by mouth daily. 30 tablet 6  . FUROSEMIDE 40 MG PO TABS Oral Take 1 tablet (40 mg total) by mouth daily. 44 tablet 11    OK to take BID for 1-2 days PRN for edema (wt gain ...  . HYDROXYZINE HCL 10 MG PO TABS Oral Take 10 mg by mouth 3 (three) times daily as needed.    . INSULIN GLARGINE 100 UNIT/ML [place] SOLN Subcutaneous Inject 10-22 Units into the skin 2 (two) times daily. 22 in am, 8  units in pm    . INSULIN LISPRO (HUMAN) 100 UNIT/ML [place] SOLN Subcutaneous Inject 0-9 Units into the skin 4 (four) times daily. Per sliding scale DO NOT GIVE WITHOUT CONSULTING PATIENT ON HER SLIDING SCALE    . ISOSORBIDE MONONITRATE ER 30 MG PO TB24 Oral Take 1 tablet (30 mg total) by mouth daily. 30 tablet 11  . LATANOPROST 0.005 % OP SOLN Both Eyes Place 1 drop into both eyes at bedtime.      [name] LEVOTHYROXINE SODIUM 125 MCG PO TABS Oral Take 125 mcg by mouth daily.      [name] METOPROLOL SUCCINATE ER 25 MG PO TB24 Oral Take 1 tablet (25 mg total) by mouth daily. 30 tablet 6  . NITROGLYCERIN 0.4 MG SL SUBL Sublingual Place 0.4 mg under the tongue every 5 (five) minutes as needed.      [name] OMEPRAZOLE 40 MG PO CPDR Oral Take 1 capsule (40 mg total) by mouth daily. 30 capsule 6    BP 130/46  Pulse 74  Temp 98.8 F (37.1 C)  (Oral)  Resp 20  Ht 5' 3.5" (1.613 m)  Wt 165 lb (74.844  kg)  BMI 28.77 kg/m2  SpO2 99%  Physical Exam  Nursing note and vitals reviewed. Constitutional: She is oriented to person, place, and time. She appears well-developed and well-nourished.  HENT:  Head: Normocephalic and atraumatic.  Eyes: Conjunctivae and EOM are normal. Pupils are equal, round, and reactive to light.  Neck: Normal range of motion. Neck supple.  Cardiovascular: Normal rate, regular rhythm and intact distal pulses.   Pulmonary/Chest: Effort normal and breath sounds normal.  Abdominal: Soft. Bowel sounds are normal.  Musculoskeletal: Normal range of motion. She exhibits tenderness. She exhibits no edema.       Cellulitis to RLE with erythema and tenderness  Neurological: She is alert and oriented to person, place, and time. She has normal reflexes.  Skin: Skin is warm and dry.  Psychiatric: She has a normal mood and affect.    ED Course  Procedures (including critical care time)  Labs Reviewed  CBC - Abnormal; Notable for the following:    Hemoglobin 9.8 (*)     HCT 32.0 (*)     MCV 71.4 (*)     MCH 21.9 (*)     RDW 19.7 (*)     All other components within normal limits  DIFFERENTIAL - Abnormal; Notable for the following:    Monocytes Relative 13 (*)     All other components within normal limits  COMPREHENSIVE METABOLIC PANEL - Abnormal; Notable for the following:    Glucose, Bld 216 (*)     BUN 28 (*)     Creatinine, Ser 1.46 (*)     Albumin 3.1 (*)     Alkaline Phosphatase 125 (*)     GFR calc non Af Amer 36 (*)     GFR calc Af Amer 42 (*)     All other components within normal limits   No results found.   No diagnosis found.    MDM  Patient will be admitted for cellulitis to the rightlower extremity per teaching service.  Vancomycin 1gm given in the ER.  WBC 7.6.   Labs Reviewed  CBC - Abnormal; Notable for the following:    Hemoglobin 9.8 (*)     HCT 32.0 (*)     MCV 71.4 (*)      MCH 21.9 (*)     RDW 19.7 (*)     All other components within normal limits  DIFFERENTIAL - Abnormal; Notable for the following:    Monocytes Relative 13 (*)     All other components within normal limits  COMPREHENSIVE METABOLIC PANEL - Abnormal; Notable for the following:    Glucose, Bld 216 (*)     BUN 28 (*)     Creatinine, Ser 1.46 (*)     Albumin 3.1 (*)     Alkaline Phosphatase 125 (*)     GFR calc non Af Amer 36 (*)     GFR calc Af Amer 42 (*)     All other components within normal limits  GLUCOSE, CAPILLARY - Abnormal; Notable for the following:    Glucose-Capillary 125 (*)     All other components within normal limits          Remi Haggard, NP 04/03/12 2125

## 2012-04-03 NOTE — ED Notes (Signed)
Patient reports she noted redness to her right lower leg on Thursday night.  Patient states the area has increased.  She has bright red coloring noted to her lower leg.  She cannot stand the area to be touched due to pain.  She also has hx of diabetes.  Patient has had low grade fever at night

## 2012-04-03 NOTE — Progress Notes (Signed)
[address] [place], [place]  [address] Phone: [phone] Fax:  [phone]  Date:  04/03/2012   Name:  [name]   DOB:  [birth year]   MRN:  [id number]  PCP:  No primary provider on file.  Primary Cardiologist:  Dr. Dietrich Pates  Primary Electrophysiologist:  None    History of Present Illness: [name] is a 66 y.o. female who returns for evaluation of right leg pain.  She has a history of CAD (s/p CABG- cath in 09/2011 showed patent LIMA to LAD with occlusion of distal LAD; High grade stenosis of nondominant RCA; Signif disease of prox LCx; SVG to L PDA occluded, SVG to OM patent), Aortic stenosis, DM, dyslipidemia, POVD, s/p revascularization - followed by [name], CKD.  The patient underwent attempt of PTCA prox LCx by [name].  This was unsussessful and the procedure was aborted.  The patient was evaluated by [name].  He thought she would be a very poor candidate for surgical revascularization.  Med Rx continued.  Echo 10/2011: Mild LVH, EF 30%, apex akinetic, septal HK, grade 2 diastolic dysfunction, or functionally bicuspid aortic valve, mild aortic stenosis by gradient, severe by calculated AVA-suspect A. Aortic stenosis probably moderate, trivial MR, mild LAE, mild RAE.  Last seen by [name] 5/29.  ACE inhibitor was held secondary to cough and she was placed on PPI.  She presents today with several days of right lower extremity and ankle pain and redness.  This is a burning and stinging type pain.  It is quite painful just to light touch.  No history of gout.  She has had mildly elevated temperatures.  She has had chills.    She has chronic class III dyspnea.  There have been no changes.  She has exertional chest tightness associated with this.  There have been no changes.  She sleeps on one to 2 pillows which is stable.  She denies PND.  She has chronic lower extremity edema which is also stable.  She denies syncope.  She continues  to complain of a nonproductive cough.  Recent measures undertaken at her last office visit have not been helpful.  She has a poor appetite.  Wt Readings from Last 3 Encounters:  04/03/12 165 lb (74.844 kg)  03/07/12 172 lb (78.019 kg)  11/30/11 170 lb (77.111 kg)     Potassium  Date/Time Value Range Status  03/18/2012  3:55 PM 4.5  3.5 - 5.1 mEq/L Final     Creatinine, Ser  Date/Time Value Range Status  03/18/2012  3:55 PM 1.4* 0.4 - 1.2 mg/dL Final     ALT  Date/Time Value Range Status  11/12/2011  7:36 PM 12  0 - 35 U/L Final     TSH  Date/Time Value Range Status  11/12/2011 11:53 PM 0.914  0.350 - 4.500 uIU/mL Final     Hemoglobin  Date/Time Value Range Status  03/07/2012 11:19 AM 10.0* 12.0 - 15.0 g/dL Final    Past Medical History  Diagnosis Date  . HTN (hypertension)   . Hypothyroidism   . Diabetes mellitus, type 2     insulin dependent  . History of recurrent UTIs   . CAD (coronary artery disease)     s/p CABG x3 in 1993; last cath in 2010  . Scleroderma   . Bilateral carpal tunnel syndrome 1970s  . PVD (peripheral vascular disease)     Toes amputated from left foot and has had  prior bypass on left leg. Followed by [name]  . Arthritis   . Anemia   . Claudication   . Aortic stenosis     Moderate per echo in May 2012  . Hyperlipidemia     on Lipitor  . Myocardial infarction 1986  . Seizure     ? seizure like event in 2010; workup included stress testing which led to repeat cath.   . Cat bite 2009    with MRSA; required I & D  . Heart murmur   . Shortness of breath   . Cancer     breast cancer  . Blood transfusion     no reaction from transfusion  . GERD (gastroesophageal reflux disease)     Current Outpatient Prescriptions  Medication Sig Dispense Refill  . aspirin EC 325 MG EC tablet Take 325 mg by mouth daily.        [name] atorvastatin (LIPITOR) 40 MG tablet Take 1 tablet (40 mg total) by mouth at bedtime.  30 tablet  11  . brimonidine (ALPHAGAN)  0.2 % ophthalmic solution Place 1 drop into both eyes 2 (two) times daily.        Tery Sanfilippo Sodium (COLACE PO) Take 1 capsule by mouth daily as needed. For constipation      . ezetimibe (ZETIA) 10 MG tablet Take 1 tablet (10 mg total) by mouth daily.  30 tablet  6  . furosemide (LASIX) 20 MG tablet Take 1 tablet (20 mg total) by mouth daily.  30 tablet  6  . furosemide (LASIX) 40 MG tablet Take 1 tablet (40 mg total) by mouth daily.  44 tablet  11  . hydrOXYzine (ATARAX/VISTARIL) 10 MG tablet Take 10 mg by mouth 3 (three) times daily as needed.      . insulin glargine (LANTUS) 100 UNIT/ML injection Inject 10-22 Units into the skin 2 (two) times daily. 22 in am, 8  units in pm      . insulin lispro (HUMALOG PEN) 100 UNIT/ML injection Inject 0-9 Units into the skin 4 (four) times daily. Per sliding scale DO NOT GIVE WITHOUT CONSULTING PATIENT ON HER SLIDING SCALE      . isosorbide mononitrate (IMDUR) 30 MG 24 hr tablet Take 1 tablet (30 mg total) by mouth daily.  30 tablet  11  . latanoprost (XALATAN) 0.005 % ophthalmic solution Place 1 drop into both eyes at bedtime.        . metoprolol succinate (TOPROL-XL) 25 MG 24 hr tablet Take 1 tablet (25 mg total) by mouth daily.  30 tablet  6  . nitroGLYCERIN (NITROSTAT) 0.4 MG SL tablet Place 0.4 mg under the tongue every 5 (five) minutes as needed.        [name] omeprazole (PRILOSEC) 40 MG capsule Take 1 capsule (40 mg total) by mouth daily.  30 capsule  6  . levothyroxine (SYNTHROID, LEVOTHROID) 125 MCG tablet Take 125 mcg by mouth daily.        [name] DISCONTD: nitroGLYCERIN (NITROSTAT) 0.4 MG SL tablet Place 1 tablet (0.4 mg total) under the tongue every 5 (five) minutes as needed for chest pain.  25 tablet  3    Allergies: Allergies  Allergen Reactions  . Adhesive (Tape) Other (See Comments)    "takes my skin off"  . Cephalexin Swelling and Rash  . Ciprofloxacin Swelling and Rash    History  Substance Use Topics  . Smoking status: Never Smoker   .  Smokeless tobacco: Never Used  .  Alcohol Use: No    Family History  Problem Relation Age of Onset  . Diabetes Mother      ROS:  Please see the history of present illness.     All other systems reviewed and negative.   PHYSICAL EXAM: VS:  BP 97/54  Pulse 76  Ht 5' 3.5" (1.613 m)  Wt 165 lb (74.844 kg)  BMI 28.77 kg/m2 Well nourished, well developed, in no acute distress HEENT: normal Neck: no JVD Cardiac:  normal S1, S2; RRR; 2/6 systolic murmur RUSB Lungs:  clear to auscultation bilaterally, no wheezing, rhonchi or rales Abd: soft, nontender, no hepatomegaly Ext: Tight trace to 1+ bilateral LE edema Skin: Large area of erythema noted over the right lower extremity beginning at the level of the ankle that is tender to just light touch with associated warmth; no open wounds Neuro:  CNs 2-12 intact, no focal abnormalities noted  EKG:  Sinus rhythm, heart rate 71, normal axis, T wave inversions 1, aVL, no significant change when compared to prior tracings   ASSESSMENT AND PLAN:  1.  Cellulitis I am concerned that she has significant cellulitis.  She's had fevers as well as a lower than normal blood pressure.  She is a brittle diabetic.  She has peripheral arterial disease with prior lower extremity revascularization surgery.  I believe that she requires Admission to the hospital for IV antibiotics.  She should be admitted on the internal medicine service.  I spoke to the emergency room triage nurse who suggested that she be sent to the emergency room to be seen by the EDP with plans for admission to the internal medicine service for proper treatment.  I d/w Dr. Olga Millers (DOD) who agreed.  2.  Coronary Artery Disease Stable.  Continue current regimen.  3.  Chronic Systolic Congestive Heart Failure Volume stable.  Continue current regimen.  4.  Aortic stenosis Continue current management.  5.  Peripheral Arterial Disease Followed by [name].  Last ABIs 05/2011 with  moderately decreased perfusion in the bilateral lower extremities and no significant change compared to prior exams (right ABI 0.0; left 0.75).  6.  Cough As noted above, she needs admission to the hospital for treatment of her cellulitis and will be referred to the emergency room for evaluation by the ER physician.  After recovery from her cellulitis, she would benefit from referral to pulmonology for further evaluation and treatment of her cough.   Signed, Tereso Newcomer, PA-C  9:19 AM 04/03/2012

## 2012-04-04 ENCOUNTER — Inpatient Hospital Stay (HOSPITAL_COMMUNITY): Payer: Medicare Other

## 2012-04-04 ENCOUNTER — Encounter (HOSPITAL_COMMUNITY): Payer: Self-pay

## 2012-04-04 DIAGNOSIS — M79609 Pain in unspecified limb: Secondary | ICD-10-CM | POA: Diagnosis not present

## 2012-04-04 DIAGNOSIS — L03119 Cellulitis of unspecified part of limb: Secondary | ICD-10-CM | POA: Diagnosis not present

## 2012-04-04 DIAGNOSIS — E109 Type 1 diabetes mellitus without complications: Secondary | ICD-10-CM

## 2012-04-04 DIAGNOSIS — M7989 Other specified soft tissue disorders: Secondary | ICD-10-CM | POA: Diagnosis not present

## 2012-04-04 DIAGNOSIS — L0390 Cellulitis, unspecified: Secondary | ICD-10-CM | POA: Diagnosis not present

## 2012-04-04 DIAGNOSIS — N289 Disorder of kidney and ureter, unspecified: Secondary | ICD-10-CM

## 2012-04-04 DIAGNOSIS — L03115 Cellulitis of right lower limb: Secondary | ICD-10-CM | POA: Diagnosis present

## 2012-04-04 LAB — BASIC METABOLIC PANEL
BUN: 31 mg/dL — ABNORMAL HIGH (ref 6–23)
CO2: 25 mEq/L (ref 19–32)
Calcium: 9 mg/dL (ref 8.4–10.5)
Creatinine, Ser: 1.44 mg/dL — ABNORMAL HIGH (ref 0.50–1.10)
GFR calc non Af Amer: 37 mL/min — ABNORMAL LOW (ref >90)
Glucose, Bld: 172 mg/dL — ABNORMAL HIGH (ref 70–99)

## 2012-04-04 LAB — RETICULOCYTES
RBC.: 4.37 MIL/uL (ref 3.87–5.11)
Retic Ct Pct: 1.4 % (ref 0.4–3.1)

## 2012-04-04 LAB — GLUCOSE, CAPILLARY
Glucose-Capillary: 130 mg/dL — ABNORMAL HIGH (ref 70–99)
Glucose-Capillary: 139 mg/dL — ABNORMAL HIGH (ref 70–99)
Glucose-Capillary: 162 mg/dL — ABNORMAL HIGH (ref 70–99)
Glucose-Capillary: 342 mg/dL — ABNORMAL HIGH (ref 70–99)
Glucose-Capillary: 96 mg/dL (ref 70–99)

## 2012-04-04 LAB — IRON AND TIBC
Saturation Ratios: 4 % — ABNORMAL LOW (ref 20–55)
TIBC: 342 ug/dL (ref 250–470)
UIBC: 327 ug/dL (ref 125–400)

## 2012-04-04 LAB — CBC
MCH: 21.7 pg — ABNORMAL LOW (ref 26.0–34.0)
MCHC: 30.2 g/dL (ref 30.0–36.0)
MCV: 72.1 fL — ABNORMAL LOW (ref 78.0–100.0)
Platelets: 295 10*3/uL (ref 150–400)

## 2012-04-04 LAB — VITAMIN B12: Vitamin B-12: 180 pg/mL — ABNORMAL LOW (ref 211–911)

## 2012-04-04 MED ORDER — METOPROLOL SUCCINATE ER 25 MG PO TB24
25.0000 mg | ORAL_TABLET | Freq: Every day | ORAL | Status: DC
  Administered 2012-04-04 – 2012-04-10 (×7): 25 mg via ORAL
  Filled 2012-04-04 (×7): qty 1

## 2012-04-04 MED ORDER — PIPERACILLIN-TAZOBACTAM 3.375 G IVPB
3.3750 g | Freq: Three times a day (TID) | INTRAVENOUS | Status: DC
  Administered 2012-04-04 – 2012-04-09 (×14): 3.375 g via INTRAVENOUS
  Filled 2012-04-04 (×17): qty 50

## 2012-04-04 MED ORDER — INSULIN GLARGINE 100 UNIT/ML ~~LOC~~ SOLN
8.0000 [IU] | Freq: Every day | SUBCUTANEOUS | Status: DC
  Administered 2012-04-04 – 2012-04-08 (×6): 8 [IU] via SUBCUTANEOUS

## 2012-04-04 MED ORDER — HYDROCODONE-ACETAMINOPHEN 5-325 MG PO TABS
1.0000 | ORAL_TABLET | ORAL | Status: DC | PRN
  Filled 2012-04-04: qty 2

## 2012-04-04 MED ORDER — MUPIROCIN 2 % EX OINT
1.0000 "application " | TOPICAL_OINTMENT | Freq: Two times a day (BID) | CUTANEOUS | Status: AC
  Administered 2012-04-04 – 2012-04-08 (×10): 1 via NASAL
  Filled 2012-04-04 (×3): qty 22

## 2012-04-04 MED ORDER — ACETAMINOPHEN 325 MG PO TABS
650.0000 mg | ORAL_TABLET | Freq: Four times a day (QID) | ORAL | Status: DC | PRN
  Administered 2012-04-05 – 2012-04-09 (×7): 650 mg via ORAL
  Filled 2012-04-04 (×7): qty 2

## 2012-04-04 MED ORDER — BRIMONIDINE TARTRATE 0.2 % OP SOLN
1.0000 [drp] | Freq: Two times a day (BID) | OPHTHALMIC | Status: DC
  Administered 2012-04-04 – 2012-04-10 (×13): 1 [drp] via OPHTHALMIC
  Filled 2012-04-04: qty 5

## 2012-04-04 MED ORDER — NITROGLYCERIN 0.4 MG SL SUBL: 0.4000 mg | SUBLINGUAL_TABLET | SUBLINGUAL | Status: DC | PRN

## 2012-04-04 MED ORDER — INSULIN ASPART 100 UNIT/ML ~~LOC~~ SOLN: 0.0000 [IU] | Freq: Every day | SUBCUTANEOUS | Status: DC

## 2012-04-04 MED ORDER — EZETIMIBE 10 MG PO TABS
10.0000 mg | ORAL_TABLET | Freq: Every day | ORAL | Status: DC
  Administered 2012-04-04 – 2012-04-10 (×7): 10 mg via ORAL
  Filled 2012-04-04 (×7): qty 1

## 2012-04-04 MED ORDER — VANCOMYCIN HCL 1000 MG IV SOLR
750.0000 mg | INTRAVENOUS | Status: DC
  Administered 2012-04-04 – 2012-04-08 (×5): 750 mg via INTRAVENOUS
  Filled 2012-04-04 (×6): qty 750

## 2012-04-04 MED ORDER — ATORVASTATIN CALCIUM 40 MG PO TABS
40.0000 mg | ORAL_TABLET | Freq: Every day | ORAL | Status: DC
  Administered 2012-04-04 – 2012-04-09 (×6): 40 mg via ORAL
  Filled 2012-04-04 (×7): qty 1

## 2012-04-04 MED ORDER — LATANOPROST 0.005 % OP SOLN
1.0000 [drp] | Freq: Every day | OPHTHALMIC | Status: DC
  Administered 2012-04-04 – 2012-04-09 (×6): 1 [drp] via OPHTHALMIC
  Filled 2012-04-04: qty 2.5

## 2012-04-04 MED ORDER — PANTOPRAZOLE SODIUM 40 MG PO TBEC
80.0000 mg | DELAYED_RELEASE_TABLET | Freq: Every day | ORAL | Status: DC
  Administered 2012-04-04 – 2012-04-10 (×7): 80 mg via ORAL
  Filled 2012-04-04: qty 1
  Filled 2012-04-04: qty 2
  Filled 2012-04-04 (×2): qty 1
  Filled 2012-04-04 (×3): qty 2
  Filled 2012-04-04 (×3): qty 1

## 2012-04-04 MED ORDER — INSULIN ASPART 100 UNIT/ML ~~LOC~~ SOLN
0.0000 [IU] | Freq: Three times a day (TID) | SUBCUTANEOUS | Status: DC
  Administered 2012-04-04: 1 [IU] via SUBCUTANEOUS
  Administered 2012-04-04: 2 [IU] via SUBCUTANEOUS
  Administered 2012-04-04: 1 [IU] via SUBCUTANEOUS
  Administered 2012-04-05 – 2012-04-06 (×2): 2 [IU] via SUBCUTANEOUS
  Administered 2012-04-06: 5 [IU] via SUBCUTANEOUS
  Administered 2012-04-07 – 2012-04-08 (×2): 3 [IU] via SUBCUTANEOUS
  Administered 2012-04-08: 2 [IU] via SUBCUTANEOUS
  Administered 2012-04-09: 3 [IU] via SUBCUTANEOUS
  Administered 2012-04-10: 1 [IU] via SUBCUTANEOUS

## 2012-04-04 MED ORDER — ACETAMINOPHEN 650 MG RE SUPP: 650.0000 mg | Freq: Four times a day (QID) | RECTAL | Status: DC | PRN

## 2012-04-04 MED ORDER — INSULIN GLARGINE 100 UNIT/ML ~~LOC~~ SOLN: 8.0000 [IU] | Freq: Two times a day (BID) | SUBCUTANEOUS | Status: DC

## 2012-04-04 MED ORDER — FUROSEMIDE 40 MG PO TABS
60.0000 mg | ORAL_TABLET | ORAL | Status: DC
  Administered 2012-04-04: 60 mg via ORAL
  Filled 2012-04-04: qty 1

## 2012-04-04 MED ORDER — LEVOTHYROXINE SODIUM 125 MCG PO TABS
125.0000 ug | ORAL_TABLET | Freq: Every day | ORAL | Status: DC
  Administered 2012-04-04 – 2012-04-10 (×7): 125 ug via ORAL
  Filled 2012-04-04 (×8): qty 1

## 2012-04-04 MED ORDER — INSULIN ASPART 100 UNIT/ML ~~LOC~~ SOLN
0.0000 [IU] | Freq: Every day | SUBCUTANEOUS | Status: DC
  Administered 2012-04-04: 3 [IU] via SUBCUTANEOUS
  Administered 2012-04-07: 2 [IU] via SUBCUTANEOUS

## 2012-04-04 MED ORDER — FUROSEMIDE 40 MG PO TABS
40.0000 mg | ORAL_TABLET | ORAL | Status: DC
  Administered 2012-04-05: 40 mg via ORAL
  Filled 2012-04-04: qty 1

## 2012-04-04 MED ORDER — ISOSORBIDE MONONITRATE ER 30 MG PO TB24
30.0000 mg | ORAL_TABLET | Freq: Every day | ORAL | Status: DC
  Administered 2012-04-04 – 2012-04-10 (×7): 30 mg via ORAL
  Filled 2012-04-04 (×7): qty 1

## 2012-04-04 MED ORDER — ASPIRIN EC 325 MG PO TBEC
325.0000 mg | DELAYED_RELEASE_TABLET | Freq: Every day | ORAL | Status: DC
  Administered 2012-04-04 – 2012-04-10 (×7): 325 mg via ORAL
  Filled 2012-04-04 (×8): qty 1

## 2012-04-04 MED ORDER — DOCUSATE SODIUM 100 MG PO CAPS
100.0000 mg | ORAL_CAPSULE | Freq: Every day | ORAL | Status: DC | PRN
  Administered 2012-04-04: 100 mg via ORAL
  Filled 2012-04-04: qty 1

## 2012-04-04 MED ORDER — INSULIN GLARGINE 100 UNIT/ML ~~LOC~~ SOLN
22.0000 [IU] | Freq: Every day | SUBCUTANEOUS | Status: DC
  Administered 2012-04-04 – 2012-04-05 (×2): 22 [IU] via SUBCUTANEOUS

## 2012-04-04 MED ORDER — ENOXAPARIN SODIUM 40 MG/0.4ML ~~LOC~~ SOLN
40.0000 mg | SUBCUTANEOUS | Status: DC
  Filled 2012-04-04 (×4): qty 0.4

## 2012-04-04 MED ORDER — FERROUS SULFATE 325 (65 FE) MG PO TABS
325.0000 mg | ORAL_TABLET | Freq: Three times a day (TID) | ORAL | Status: DC
  Filled 2012-04-04 (×5): qty 1

## 2012-04-04 MED ORDER — BENZONATATE 100 MG PO CAPS
100.0000 mg | ORAL_CAPSULE | Freq: Three times a day (TID) | ORAL | Status: DC | PRN
  Filled 2012-04-04: qty 1

## 2012-04-04 MED ORDER — HYDROXYZINE HCL 10 MG PO TABS: 10.0000 mg | ORAL_TABLET | Freq: Three times a day (TID) | ORAL | Status: DC | PRN

## 2012-04-04 NOTE — Progress Notes (Signed)
Subjective:  Patient still has similar level of pain in he right leg. Per patient, the redness and swelling has not improved. She is afebrile. Has mild cough which is a chronic and has not changed.  Objective: Vital signs in last 24 hours: Filed Vitals:   04/04/12 0018 04/04/12 0511 04/04/12 1014 04/04/12 1317  BP: 145/51 117/57 138/69 107/59  Pulse: 64 60 68 58  Temp: 97.7 F (36.5 C) 98.7 F (37.1 C) 97.8 F (36.6 C) 97.8 F (36.6 C)  TempSrc: Oral Oral Oral Oral  Resp: 20 20 18 21   Height:      Weight: 164 lb 15.9 oz (74.84 kg)     SpO2: 100% 100% 100% 99%   Weight change:   Intake/Output Summary (Last 24 hours) at 04/04/12 1350 Last data filed at 04/04/12 1300  Gross per 24 hour  Intake    360 ml  Output      0 ml  Net    360 ml   General: resting in bed, not in acute distress HEENT: PERRL, EOMI, no scleral icterus Cardiac: S1/S2, 2/6 systolic murmur left sternal border; no S3, no S4, RRR, No murmurs, gallops or rubs Pulm: Good air movement bilaterally, Clear to auscultation bilaterally, No rales, wheezing, rhonchi or rubs. Abd: Soft,  nondistended, nontender, no rebound pain, no organomegaly, BS present Ext: There is erythema and tenderness of the right leg and heel, with marked tenderness over the lower half of the right lateral leg.  There are no obvious skin breaks, although there is thickening and scaling of the skin on the patient's sole.  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 Skin: no rashes.  Neuro: alert and oriented X3, cranial nerves II-XII grossly intact, muscle strength 5/5 in all extremeties,  sensation to light touch intact.  Psych. patient is not psychotic, no suicidal or hemocidal ideation.  Lab Results: Basic Metabolic Panel:  Lab 04/04/12 1610 04/03/12 1616  NA 136 136  K 4.0 4.6  CL 99 99  CO2 25 26  GLUCOSE 172* 216*  BUN 31* 28*  CREATININE  1.44* 1.46*  CALCIUM 9.0 8.9  MG -- --  PHOS -- --   Liver Function Tests:  Lab 04/03/12 1616  AST 21  ALT 14  ALKPHOS 125*  BILITOT 0.6  PROT 7.1  ALBUMIN 3.1*   No results found for this basename: LIPASE:2,AMYLASE:2 in the last 168 hours No results found for this basename: AMMONIA:2 in the last 168 hours CBC:  Lab 04/04/12 0610 04/03/12 1616  WBC 6.2 7.6  NEUTROABS -- 5.2  HGB 9.5* 9.8*  HCT 31.5* 32.0*  MCV 72.1* 71.4*  PLT 295 304   Cardiac Enzymes: No results found for this basename: CKTOTAL:3,CKMB:3,CKMBINDEX:3,TROPONINI:3 in the last 168 hours BNP: No results found for this basename: PROBNP:3 in the last 168 hours D-Dimer: No results found for this basename: DDIMER:2 in the last 168 hours CBG:  Lab 04/04/12 0017 04/03/12 2034  GLUCAP 342* 125*   Hemoglobin A1C: No results found for this basename: HGBA1C in the last 168 hours Fasting Lipid Panel: No results found for this basename: CHOL,HDL,LDLCALC,TRIG,CHOLHDL,LDLDIRECT in the last 960 hours Thyroid Function Tests: No results found for this basename: TSH,T4TOTAL,FREET4,T3FREE,THYROIDAB in the last 168 hours Coagulation: No results found for this basename: LABPROT:4,INR:4 in the last 168 hours Anemia Panel:  Lab 04/04/12 0610  VITAMINB12 180*  FOLATE >20.0  FERRITIN  16  TIBC 342  IRON 15*  RETICCTPCT 1.4   Urine Drug Screen: Drugs of Abuse  No results found for this basename: labopia, cocainscrnur, labbenz, amphetmu, thcu, labbarb    Alcohol Level: No results found for this basename: ETH:2 in the last 168 hours Urinalysis: No results found for this basename: COLORURINE:2,APPERANCEUR:2,LABSPEC:2,PHURINE:2,GLUCOSEU:2,HGBUR:2,BILIRUBINUR:2,KETONESUR:2,PROTEINUR:2,UROBILINOGEN:2,NITRITE:2,LEUKOCYTESUR:2 in the last 168 hours   Micro Results: Recent Results (from the past 240 hour(s))  MRSA PCR SCREENING     Status: Abnormal   Collection Time   04/04/12  1:03 AM      Component Value Range Status  Comment   MRSA by PCR POSITIVE (*) NEGATIVE Final    Studies/Results: No results found. Medications:  Scheduled Meds:   . aspirin EC  325 mg Oral Daily  . atorvastatin  40 mg Oral QHS  . brimonidine  1 drop Both Eyes BID  . enoxaparin  40 mg Subcutaneous Q24H  . ezetimibe  10 mg Oral Daily  . furosemide  40 mg Oral QODAY  . furosemide  60 mg Oral QODAY  . insulin aspart  0-5 Units Subcutaneous QHS  . insulin aspart  0-9 Units Subcutaneous TID WC  . insulin glargine  22 Units Subcutaneous Daily  . insulin glargine  8 Units Subcutaneous QHS  . isosorbide mononitrate  30 mg Oral Daily  . latanoprost  1 drop Both Eyes QHS  . levothyroxine  125 mcg Oral Q0600  . metoprolol succinate  25 mg Oral Daily  . mupirocin ointment  1 application Nasal BID  . pantoprazole  80 mg Oral Q1200  . vancomycin  750 mg Intravenous Q24H  . vancomycin  1,000 mg Intravenous Once  . DISCONTD: insulin aspart  0-5 Units Subcutaneous QHS  . DISCONTD: insulin glargine  8-22 Units Subcutaneous BID   Continuous Infusions:  PRN Meds:.acetaminophen, acetaminophen, benzonatate, docusate sodium, HYDROcodone-acetaminophen, hydrOXYzine, nitroGLYCERIN Assessment/Plan:  # Right LE cellulitis:  Patient's leg swelling and pain are most likely d/t cellulitis given h/o PVD & uncontrolled DM.  Skin is calloused with high risk of skin breaks. 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 DVT less likely as erythema is anterolateral with no posterior tenderness. Patient still has significant pain which does not allow even light touch. Swelling has also not improved per patient. It is concerning that patient may have deep infection. Currently patient is afebrile and no leukocytosis. Her PVD may also contribute to this severe pain.  - Will consult her vascular Surgeon, [name] and follow recommendations. -  Will continue IV Vancomycin.  - Will add IV Zosyn to cover pseudomonas/anaerobe. Patient is allergic to cephalexin and ciprofloxacin. But she had been treated with Zosyn before without problem.  - will get MRI right leg and foot - will consider arterial doppler for evaluation of her PVD  #Chronic Renal Failure: Patient at baseline Cr, GFR = 36. Her cre is 1.44 today. Likely d/t h/o uncontrolled DM.    -Monitor BMP  #Type 1 Uncontrolled DM on Insulin: Patient reports compliance with home insulin but does not have good control, nor is she willing to make big lifestyle changes at present.  Last A1c=10.6 in 10/2011.    -Repeat A1c -Continue home dose insulin and monitor CBGs  #Chronic Microcytic Anemia: Patient's Hb is at baseline, but it does not appear to have been worked up in the past. her anemia panel: iron 15,  TIBC 327, Ferritin 16, folate>20.  These results indicate iron deficiency. At her age, iron deficiency is concerning for colon cancer. Vitamin B12 is low at 180.   -will check methylmalonic acid -will start iron supplement: ferrous sulfate tablet 325 mg, tid po -will consider colonoscope as outpatient. -pending FOBT -FOBT x3, or until positive -Patient will need to be established with a PCP and have outpatient colonoscopy  #h/o CAD: Stable without chest pain.  Being managed medically, as intervention on LCx was not successful.  She is not felt to be a good candidate for surgery d/t poor distal vessels.  Most recent LDL = 42 on 11/13/11. -Continue ASA, atorvastatin, metoprolol  #h/o Hypothyrodism: Last TSH on 11/12/11 was wnl (0.914).   -Continue home dose levothyroxine  #Chronic Systolic & Diastolic CHF: Not in acute exacerbation.  Patient's last echo was on 11/14/11 revealing EF of 30-35% with Grade 2 diastolic dysfunction as well as AS.  Patient is followed by [facility]ology (Dr. Riley Kill, [name]) -Strict I/Os, Daily weights  -Continue lasix at home dose (40mg  every other day,  60mg  every other day) - Check BMP  #VTE: Lovenox   LOS: 1 day   [name] 04/04/2012, 1:50 PM

## 2012-04-04 NOTE — ED Provider Notes (Signed)
Medical screening examination/treatment/procedure(s) were conducted as a shared visit with non-physician practitioner(s) and myself.  I personally evaluated the patient during the encounter 66 yo diabetic lady who has suffered prior to amputations on the left foot has cellulitis of her right lower leg.  Advised admission for IV antibiotics.  Carleene Cooper III, MD 04/04/12 1340

## 2012-04-04 NOTE — Progress Notes (Signed)
Inpatient Diabetes Program Recommendations  AACE/ADA: New Consensus Statement on Inpatient Glycemic Control (2009)  Target Ranges:  Prepandial:   less than 140 mg/dL      Peak postprandial:   less than 180 mg/dL (1-2 hours)      Critically ill patients:  140 - 180 mg/dL     Inpatient Diabetes Program Recommendations HgbA1C: Please check current A1c- Last A1c on record was 10.6% (11/12/11)  Note: Will follow. Ambrose Finland RN, MSN, CDE Diabetes Coordinator Inpatient Diabetes Program [phone]

## 2012-04-04 NOTE — Progress Notes (Signed)
ANTIBIOTIC CONSULT NOTE - FOLLOW UP  Pharmacy Consult for add zosyn to vancomycin Indication: R LE cellulitis  Allergies  Allergen Reactions  . Adhesive (Tape) Other (See Comments)    "takes my skin off"  . Cephalexin Swelling and Rash  . Ciprofloxacin Swelling and Rash    Patient Measurements: Height: 5' 3.5" (161.3 cm) Weight: 164 lb 15.9 oz (74.84 kg) IBW/kg (Calculated) : 53.55    Vital Signs: Temp: 97.8 F (36.6 C) (06/21 1317) Temp src: Oral (06/21 1317) BP: 107/59 mmHg (06/21 1317) Pulse Rate: 58  (06/21 1317) Intake/Output from previous day:   Intake/Output from this shift: Total I/O In: 360 [P.O.:360] Out: -   Labs:  Basename 04/04/12 0610 04/03/12 1616  WBC 6.2 7.6  HGB 9.5* 9.8*  PLT 295 304  LABCREA -- --  CREATININE 1.44* 1.46*   Estimated Creatinine Clearance: 37.7 ml/min (by C-G formula based on Cr of 1.44). No results found for this basename: VANCOTROUGH:2,VANCOPEAK:2,VANCORANDOM:2,GENTTROUGH:2,GENTPEAK:2,GENTRANDOM:2,TOBRATROUGH:2,TOBRAPEAK:2,TOBRARND:2,AMIKACINPEAK:2,AMIKACINTROU:2,AMIKACIN:2, in the last 72 hours   Microbiology: Recent Results (from the past 720 hour(s))  MRSA PCR SCREENING     Status: Abnormal   Collection Time   04/04/12  1:03 AM      Component Value Range Status Comment   MRSA by PCR POSITIVE (*) NEGATIVE Final      Assessment: 66 yo F with R LE cellulitis on vanc x 16 hours to add zosyn to cover pseudomonas/anaerobes.  She is allergic to keflex but she has had zosyn before.  No clinical s/sx of osteomyelitis but to get MRI to evaluate.  Chronic renal failure. Creatinine clearance ~ 38 ml/min.  Plan:  1. Zosyn 3.375 gm IV q8h, infuse each dose over 4 hours 2. Continue vancomcyin 750 mg IV q24 3. F/u renal function and culture data Herby Abraham, Pharm.D. [phone] 04/04/2012 2:26 PM

## 2012-04-04 NOTE — Consult Note (Signed)
[facility] CONSULT NOTE 04/04/2012 DOB: [id number] MRN : [id number]  NG:EXBMW lower extremity pain and erythema since Thursday 02-25-2012. Referring Physician: Dr. Clyde Lundborg  History of Present Illness: This is a 66 year old patient with history of type 1 diabetes mellitus, peripheral vascular disease coronary artery disease, hypertension, renal insufficiency, CHF.  She has a one week history of right anterior shin and lateral ankle pain with erythema.  She has had a history of cellulitis in the left LE in the past and was followed by [name].  The previous episode healed with close observation.  She does have a left transmetatarsal amputation 2010 secondary to open wound injury and infection secondary to long standing DM1 since age 89.  The amputation was done in stages as the foot declared itself by both [name] 2010 and Dr. Myra Gianotti 2011.  She has received one dose of Vancomycin since her admission last night.  The admitting physician has ordered an MRI of the right lower extremity to check for deep infection.   Past Medical History  Diagnosis Date  . HTN (hypertension)   . Hypothyroidism   . Diabetes mellitus, type 2     insulin dependent  . History of recurrent UTIs   . CAD (coronary artery disease)     s/p CABG x3 in 1993; last cath in 2010  . Scleroderma   . Bilateral carpal tunnel syndrome 1970s  . 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 Followed by [name]  . Arthritis   . Anemia   . Claudication   . Aortic stenosis     0.8cm2 on echo in 10/2011  . Hyperlipidemia     on Lipitor  . Myocardial infarction 1986  . Seizure     ? seizure like event in 2010; workup included stress testing which led to repeat cath.   . Cat bite 2009    with MRSA; required I & D  . Heart murmur   . Shortness of breath   . Cancer     breast cancer  . Blood transfusion     no reaction from transfusion  .  GERD (gastroesophageal reflux disease)     Past Surgical History  Procedure Date  . Carpal tunnel release   . 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 Vitrectomy   . Tubal ligation   . Tonsillectomy   . Coronary artery bypass graft 1993    LIMA to LAD, SVG to distal LCX & SVG to Marginal  . Appendectomy   . Amputation     left first and second toes  . Cardiac catheterization 2010    LIMA to LAD patent yet with occluded distal LAD after graft insertion & retrograde is occluded. 3-4 septal perforators arise &are patent. SVG to a large marginal patent; SVG presumably to the distal dominant LCX is occluded, moderately high grade stenosis of the AV circumflex, but with distal stenoses involving a severely diseased marginal branch not amenable  to PCI  nor is inferior branch      ROS: [x]  Positive  [ ]  Denies    General: [ ]  Weight loss, [ ]  Fever, [ ]  chills Neurologic: [ ]  Dizziness, [ ]  Blackouts, [ ]  Seizure [ ]  Stroke, [ ]  "Mini stroke", [ ]  Slurred speech, [ ]  Temporary blindness; [ ]  weakness in arms or legs, [ ]  Hoarseness Cardiac: [ ]  Chest pain/pressure, [x ] Shortness of  breath at rest [x ] Shortness of breath with exertion, [ ]  Atrial fibrillation or irregular heartbeat Vascular: [ ]  Pain in legs with walking, [ ]  Pain in legs at rest, [ ]  Pain in legs at night,  [ ]  Non-healing ulcer, [ ]  Blood clot in vein/DVT,   Pulmonary: [ ]  Home oxygen, [x ] non-Productive cough, [ ]  Coughing up blood, [ ]  Asthma,  [ ]  Wheezing Musculoskeletal:  [ ]  Arthritis, [ ]  Low back pain, [ ]  Joint pain Hematologic: [ ]  Easy Bruising, [ ]  Anemia; [ ]  Hepatitis Gastrointestinal: [ ]  Blood in stool, [ ]  Gastroesophageal Reflux/heartburn, [ ]  Trouble swallowing Urinary: [x ] chronic Kidney disease, [ ]  on HD - [ ]  MWF or [ ]  TTHS, [ ]  Burning with urination, [ ]  Difficulty urinating Skin: [ ]  Rashes, [ ]  Wounds [x]  right LE eyrthema Psychological: [ ]  Anxiety, [ ]  Depression  Social History History  Substance Use  Topics  . Smoking status: Never Smoker   . Smokeless tobacco: Never Used  . Alcohol Use: No    Family History Family History  Problem Relation Age of Onset  . Diabetes Mother     Allergies  Allergen Reactions  . Adhesive (Tape) Other (See Comments)    "takes my skin off"  . Cephalexin Swelling and Rash  . Ciprofloxacin Swelling and Rash    Current Facility-Administered Medications  Medication Dose Route Frequency Provider Last Rate Last Dose  . acetaminophen (TYLENOL) tablet 650 mg  650 mg Oral Q6H PRN Sunday Spillers, MD       Or  . acetaminophen (TYLENOL) suppository 650 mg  650 mg Rectal Q6H PRN Sunday Spillers, MD      . aspirin EC tablet 325 mg  325 mg Oral Daily Sunday Spillers, MD   325 mg at 04/04/12 0829  . atorvastatin (LIPITOR) tablet 40 mg  40 mg Oral QHS Sunday Spillers, MD      . benzonatate (TESSALON) capsule 100 mg  100 mg Oral TID PRN Sunday Spillers, MD      . brimonidine (ALPHAGAN) 0.2 % ophthalmic solution 1 drop  1 drop Both Eyes BID Sunday Spillers, MD   1 drop at 04/04/12 1053  . docusate sodium (COLACE) capsule 100 mg  100 mg Oral Daily PRN Sunday Spillers, MD   100 mg at 04/04/12 1300  . enoxaparin (LOVENOX) injection 40 mg  40 mg Subcutaneous Q24H Sunday Spillers, MD      . ezetimibe (ZETIA) tablet 10 mg  10 mg Oral Daily Sunday Spillers, MD   10 mg at 04/04/12 1053  . ferrous sulfate tablet 325 mg  325 mg Oral TID WC [name], MD      . furosemide (LASIX) tablet 40 mg  40 mg Oral QODAY Sunday Spillers, MD      . furosemide (LASIX) tablet 60 mg  60 mg Oral QODAY Sunday Spillers, MD   60 mg at 04/04/12 1045  . HYDROcodone-acetaminophen (NORCO) 5-325 MG per tablet 1-2 tablet  1-2 tablet Oral Q4H PRN Sunday Spillers, MD      . hydrOXYzine (ATARAX/VISTARIL) tablet 10 mg  10 mg Oral TID PRN Sunday Spillers, MD      . insulin aspart (novoLOG) injection 0-5 Units  0-5 Units Subcutaneous QHS Farley Ly, MD   3 Units at 04/04/12 0051  . insulin aspart (novoLOG) injection 0-9 Units  0-9 Units  Subcutaneous  TID WC Sunday Spillers, MD   1 Units at 04/04/12 1259  . insulin glargine (LANTUS) injection 22 Units  22 Units Subcutaneous Daily Farley Ly, MD   22 Units at 04/04/12 1044  . insulin glargine (LANTUS) injection 8 Units  8 Units Subcutaneous QHS Farley Ly, MD   8 Units at 04/04/12 0050  . isosorbide mononitrate (IMDUR) 24 hr tablet 30 mg  30 mg Oral Daily Sunday Spillers, MD   30 mg at 04/04/12 1045  . latanoprost (XALATAN) 0.005 % ophthalmic solution 1 drop  1 drop Both Eyes QHS Sunday Spillers, MD      . levothyroxine (SYNTHROID, LEVOTHROID) tablet 125 mcg  125 mcg Oral Q0600 Sunday Spillers, MD   125 mcg at 04/04/12 0830  . metoprolol succinate (TOPROL-XL) 24 hr tablet 25 mg  25 mg Oral Daily Sunday Spillers, MD   25 mg at 04/04/12 1045  . mupirocin ointment (BACTROBAN) 2 % 1 application  1 application Nasal BID Farley Ly, MD   1 application at 04/04/12 1045  . nitroGLYCERIN (NITROSTAT) SL tablet 0.4 mg  0.4 mg Sublingual Q5 min PRN Sunday Spillers, MD      . pantoprazole (PROTONIX) EC tablet 80 mg  80 mg Oral Q1200 Sunday Spillers, MD   80 mg at 04/04/12 1300  . piperacillin-tazobactam (ZOSYN) IVPB 3.375 g  3.375 g Intravenous Q8H Herby Abraham, PHARMD      . vancomycin (VANCOCIN) 750 mg in sodium chloride 0.9 % 150 mL IVPB  750 mg Intravenous Q24H Colleen Can, PHARMD      . vancomycin (VANCOCIN) IVPB 1000 mg/200 mL premix  1,000 mg Intravenous Once Remi Haggard, NP   1,000 mg at 04/03/12 2131  . DISCONTD: insulin aspart (novoLOG) injection 0-5 Units  0-5 Units Subcutaneous QHS Sunday Spillers, MD      . DISCONTD: insulin glargine (LANTUS) injection 8-22 Units  8-22 Units Subcutaneous BID Sunday Spillers, MD         Imaging: No results found.  Significant Diagnostic Studies: CBC Lab Results  Component Value Date   WBC 6.2 04/04/2012   HGB 9.5* 04/04/2012   HCT 31.5* 04/04/2012   MCV 72.1* 04/04/2012   PLT 295 04/04/2012    BMET    Component Value Date/Time   NA  136 04/04/2012 0610   NA 133* 11/29/2011 1629   K 4.0 04/04/2012 0610   CL 99 04/04/2012 0610   CO2 25 04/04/2012 0610   GLUCOSE 172* 04/04/2012 0610   GLUCOSE 313* 11/29/2011 1629   BUN 31* 04/04/2012 0610   BUN 39* 11/29/2011 1629   CREATININE 1.44* 04/04/2012 0610   CALCIUM 9.0 04/04/2012 0610   GFRNONAA 37* 04/04/2012 0610   GFRAA 43* 04/04/2012 0610    COAG Lab Results  Component Value Date   INR 1.02 11/15/2011   INR 1.08 11/12/2011   INR 1.09 10/01/2011   No results found for this basename: PTT     Physical Examination BP Readings from Last 3 Encounters:  04/04/12 107/59  04/03/12 97/54  03/07/12 159/70   Temp Readings from Last 3 Encounters:  04/04/12 97.8 F (36.6 C) Oral  11/17/11 98.6 F (37 C) Oral  11/17/11 98.6 F (37 C) Oral   SpO2 Readings from Last 3 Encounters:  04/04/12 99%  11/16/11 93%  11/16/11 93%   Pulse Readings from Last 3 Encounters:  04/04/12 58  04/03/12 76  03/07/12 73  General:  WDWN in NAD HENT: WNL Eyes: Pupils equal Cardiac: RRR, systolic murmur No carotid bruits Abdomen: soft, NT, no masses Skin: no rashes, ulcers noted Vascular Exam/Pulses: doppler DP biphasic, PT not palpable nor doppler.  Extremities without ischemic changes, no Gangrene , positive cellulitis on the anterior shin and lateral malleolus dorsum of the foot. Tenderness to palpation maximally at the lateral malleolus; no open wounds;  Musculoskeletal: no muscle wasting or atrophy  Neurologic: A&O X 3; Appropriate Affect ;  SENSATION: normal; MOTOR FUNCTION: Pt has fair  and equal strength in all extremities - 4/5,  +, decreased ROM , adequate strength Speech is fluent/normal  Non-Invasive Vascular Imaging: Patient will not tolerate ABI studies secondary to pain.  ASSESSMENT/PLAN:Right lower extremity cellulitis.  We will discuss treatment plan with Dr. Myra Gianotti.

## 2012-04-04 NOTE — H&P (Signed)
Internal Medicine Attending Admission Note Date: 04/04/2012  Patient name: [name] Medical record number: [id number] Date of birth: 06/18/1946 Age: 66 y.o. Gender: female  I saw and evaluated the patient. I reviewed the resident's note and I agree with the resident's findings and plan as documented in the resident's note, with the following additional comments.  Chief Complaint(s): Erythema and pain of the right lower leg  History - key components related to admission: Patient is a 66 year old woman with a history of type 1 diabetes mellitus, peripheral vascular disease coronary artery disease, hypertension, renal insufficiency, CHF, and other problems as outlined in the medical history, admitted with a one-week history of progressive erythema and pain of the right leg.  She also reports low-grade fever and chills.  She has chronic baseline dyspnea, and also reports a six-month history of recurrent substernal chest heaviness that occurs on a daily basis and is stable; she reports that his symptom is followed by her cardiologist [name].   Physical Exam - key components related to admission:  Filed Vitals:   04/03/12 2332 04/04/12 0018 04/04/12 0511 04/04/12 1014  BP: 124/47 145/51 117/57 138/69  Pulse: 63 64 60 68  Temp: 98.1 F (36.7 C) 97.7 F (36.5 C) 98.7 F (37.1 C) 97.8 F (36.6 C)  TempSrc: Oral Oral Oral Oral  Resp: 20 20 20 18   Height:      Weight:  164 lb 15.9 oz (74.84 kg)    SpO2: 100% 100% 100% 100%   General: Alert, no distress Lungs: Clear Heart: Regular; 2/6 systolic murmur left sternal border; no S3, no S4 Abdomen: Bowel sounds present, soft, nontender Extremities: There is erythema and tenderness of the right leg and heel, with marked tenderness over the lower half of the right lateral leg.  There are no obvious skin breaks, although there is thickening and scaling of the skin on the patient's sole.  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  Lab results:   Basic Metabolic Panel:  Basename 04/04/12 0610 04/03/12 1616  NA 136 136  K 4.0 4.6  CL 99 99  CO2 25 26  GLUCOSE 172* 216*  BUN 31* 28*  CREATININE 1.44* 1.46*  CALCIUM 9.0 8.9  MG -- --  PHOS -- --    Liver Function Tests:  [facility] 04/03/12 1616  AST 21  ALT 14  ALKPHOS 125*  BILITOT 0.6  PROT 7.1  ALBUMIN 3.1*    CBC:  Basename 04/04/12 0610 04/03/12 1616  WBC 6.2 7.6  NEUTROABS -- 5.2  HGB 9.5* 9.8*  HCT 31.5* 32.0*  MCV 72.1* 71.4*  PLT 295 304    CBG:  Basename 04/04/12 0017 04/03/12 2034  GLUCAP 342* 125*    Hemoglobin A1C  Date Value Range Status  11/12/2011 10.6* <5.7 % Final     Anemia Panel:  Basename 04/04/12 0610  VITAMINB12 --  FOLATE --  FERRITIN --  TIBC 342  IRON 15*  RETICCTPCT 1.4    Other results: EKG: Normal sinus rhythm; septal infarct, age undetermined  Assessment & Plan by Problem:  1.  Right lower extremity cellulitis.  Plans include empiric IV antibiotics; would broaden coverage to include vancomycin and Zosyn (patient reports that she has is received Zosyn the past without problems, and the E-chart record confirms this).  I am concerned about the marked tenderness over the right lateral aspect of  her leg, and would discuss with radiology the best means, given her renal insufficiency, of imaging this to rule out a deeper infection or abscess.  2.  Peripheral vascular disease.  Patient's right ABI was 0.80 in August of last year.  She is followed by [name].  Would obtain lower extremity arterial Dopplers with ABI measurements, and would also asked [name] to see her.  3.  Type 1 diabetes mellitus.  This is not well controlled based upon her last hemoglobin A1c.  Would continue her home insulin regimen, monitor CBGs, and adjust as indicated.  Would check a hemoglobin A1c.  4.  Chronic renal insufficiency.   Creatinine is near baseline; will follow.  5.  Microcytic anemia.  Plan is to check anemia panel, check stool for occult blood, follow hemoglobin.  6.  Coronary artery disease.  Patient appears to have chronic stable episodes of chest heaviness that may be anginal.  She is followed by [name]; would notify [name] of her admission.  She has no signs or symptoms of decompensated heart failure.

## 2012-04-04 NOTE — Progress Notes (Signed)
ANTIBIOTIC CONSULT NOTE - INITIAL  Pharmacy Consult for heparin Indication: cellulitis  Allergies  Allergen Reactions  . Adhesive (Tape) Other (See Comments)    "takes my skin off"  . Cephalexin Swelling and Rash  . Ciprofloxacin Swelling and Rash    Patient Measurements: Height: 5' 3.5" (161.3 cm) Weight: 164 lb 15.9 oz (74.84 kg) IBW/kg (Calculated) : 53.55   Vital Signs: Temp: 97.7 F (36.5 C) (06/21 0018) Temp src: Oral (06/21 0018) BP: 145/51 mmHg (06/21 0018) Pulse Rate: 64  (06/21 0018)  Labs:  Basename 04/03/12 1616  WBC 7.6  HGB 9.8*  PLT 304  LABCREA --  CREATININE 1.46*   Estimated Creatinine Clearance: 37.2 ml/min (by C-G formula based on Cr of 1.46).  Microbiology: No results found for this or any previous visit (from the past 720 hour(s)).  Medical History: Past Medical History  Diagnosis Date  . HTN (hypertension)   . Hypothyroidism   . Diabetes mellitus, type 2     insulin dependent  . History of recurrent UTIs   . CAD (coronary artery disease)     s/p CABG x3 in 1993; last cath in 2010  . Scleroderma   . Bilateral carpal tunnel syndrome 1970s  . 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 Followed by [name]  . Arthritis   . Anemia   . Claudication   . Aortic stenosis     Moderate per echo in May 2012  . Hyperlipidemia     on Lipitor  . Myocardial infarction 1986  . Seizure     ? seizure like event in 2010; workup included stress testing which led to repeat cath.   . Cat bite 2009    with MRSA; required I & D  . Heart murmur   . Shortness of breath   . Cancer     breast cancer  . Blood transfusion     no reaction from transfusion  . GERD (gastroesophageal reflux disease)     Medications:  Prescriptions prior to admission  Medication Sig Dispense Refill  . aspirin EC 325 MG EC tablet Take 325 mg by mouth daily.        [name] atorvastatin (LIPITOR) 40 MG tablet Take  1 tablet (40 mg total) by mouth at bedtime.  30 tablet  11  . benzonatate (TESSALON) 100 MG capsule Take 100 mg by mouth 3 (three) times daily as needed. For cough      . brimonidine (ALPHAGAN) 0.2 % ophthalmic solution Place 1 drop into both eyes 2 (two) times daily.        Tery Sanfilippo Sodium (COLACE PO) Take 1 capsule by mouth daily as needed. For constipation      . ezetimibe (ZETIA) 10 MG tablet Take 1 tablet (10 mg total) by mouth daily.  30 tablet  6  . furosemide (LASIX) 20 MG tablet Take 40-60 mg by mouth daily. Takes 40 mg every other day      . hydrOXYzine (ATARAX/VISTARIL) 10 MG tablet Take 10 mg by mouth 3 (three) times daily as needed. For itching      . insulin glargine (LANTUS) 100 UNIT/ML injection Inject 8-22 Units into the skin 2 (two) times daily. 22 in am, 8  units in pm      . insulin lispro (HUMALOG PEN) 100 UNIT/ML injection Inject 0-9 Units into the skin 4 (four) times daily. Per sliding scale DO NOT  GIVE WITHOUT CONSULTING PATIENT ON HER SLIDING SCALE      . isosorbide mononitrate (IMDUR) 30 MG 24 hr tablet Take 1 tablet (30 mg total) by mouth daily.  30 tablet  11  . latanoprost (XALATAN) 0.005 % ophthalmic solution Place 1 drop into both eyes at bedtime.        [name] levothyroxine (SYNTHROID, LEVOTHROID) 125 MCG tablet Take 125 mcg by mouth daily.        . metoprolol succinate (TOPROL-XL) 25 MG 24 hr tablet Take 1 tablet (25 mg total) by mouth daily.  30 tablet  6  . nitroGLYCERIN (NITROSTAT) 0.4 MG SL tablet Place 0.4 mg under the tongue every 5 (five) minutes as needed. For chest pain      . omeprazole (PRILOSEC) 40 MG capsule Take 1 capsule (40 mg total) by mouth daily.  30 capsule  6   Scheduled:    . aspirin EC  325 mg Oral Daily  . atorvastatin  40 mg Oral QHS  . brimonidine  1 drop Both Eyes BID  . enoxaparin  40 mg Subcutaneous Q24H  . ezetimibe  10 mg Oral Daily  . furosemide  40 mg Oral QODAY  . furosemide  60 mg Oral QODAY  . insulin aspart  0-5 Units  Subcutaneous QHS  . insulin aspart  0-9 Units Subcutaneous TID WC  . insulin glargine  22 Units Subcutaneous Daily  . insulin glargine  8 Units Subcutaneous QHS  . isosorbide mononitrate  30 mg Oral Daily  . latanoprost  1 drop Both Eyes QHS  . levothyroxine  125 mcg Oral Q0600  . metoprolol succinate  25 mg Oral Daily  . pantoprazole  80 mg Oral Q1200  . vancomycin  750 mg Intravenous Q24H  . vancomycin  1,000 mg Intravenous Once  . DISCONTD: insulin glargine  8-22 Units Subcutaneous BID    Assessment: 66yo female c/o RLE "burning" pain severe enough to prevent bearing weight, found to be febrile with swelling and tenderness to right anterior shin, to begin IV ABX for cellulitis.  Goal of Therapy:  Vancomycin trough level 10-15 mcg/ml  Plan:  Rec'd vancomycin 1000mg  IV in ED; will continue with vancomycin 750mg  IV Q24H and monitor CBC, Cx, levels prn.  Colleen Can PharmD BCPS 04/04/2012,12:26 AM

## 2012-04-04 NOTE — Progress Notes (Signed)
   CARE MANAGEMENT NOTE 04/04/2012  Patient:  [name], [name]   Account Number:  [id number]  Date Initiated:  04/04/2012  Documentation initiated by:  Darlyne Russian  Subjective/Objective Assessment:   Patient admitted with cellulitis of right lower extremity.     Action/Plan:   Progression of care and discharge planning   Anticipated DC Date:  04/07/2012   Anticipated DC Plan:  HOME W HOME HEALTH SERVICES      DC Planning Services  CM consult      Choice offered to / List presented to:             Status of service:   Medicare Important Message given?   (If response is "NO", the following Medicare IM given date fields will be blank) Date Medicare IM given:   Date Additional Medicare IM given:    Discharge Disposition:    Per UR Regulation:  Reviewed for med. necessity/level of care/duration of stay  If discussed at Long Length of Stay Meetings, dates discussed:    Comments:  04/04/2012  1500 Darlyne Russian RN, [place] [phone] Met with patient to discuss discharge planning. She lives in a one story home with steps to enter, sometimes has shortness of breath going up the stairs. Recently difficult due to her ankle/leg pain. She had [facility] in the past for home health services a few years ago. She uses a RW at home. CM to continue to follow for discharge needs.

## 2012-04-05 DIAGNOSIS — R0989 Other specified symptoms and signs involving the circulatory and respiratory systems: Secondary | ICD-10-CM

## 2012-04-05 DIAGNOSIS — M79609 Pain in unspecified limb: Secondary | ICD-10-CM | POA: Diagnosis not present

## 2012-04-05 DIAGNOSIS — E1159 Type 2 diabetes mellitus with other circulatory complications: Secondary | ICD-10-CM | POA: Diagnosis not present

## 2012-04-05 DIAGNOSIS — I739 Peripheral vascular disease, unspecified: Secondary | ICD-10-CM

## 2012-04-05 LAB — BASIC METABOLIC PANEL
GFR calc Af Amer: 40 mL/min — ABNORMAL LOW (ref >90)
GFR calc non Af Amer: 34 mL/min — ABNORMAL LOW (ref >90)
Potassium: 4 mEq/L (ref 3.5–5.1)
Sodium: 137 mEq/L (ref 135–145)

## 2012-04-05 LAB — HEMOGLOBIN A1C: Mean Plasma Glucose: 246 mg/dL — ABNORMAL HIGH (ref <117)

## 2012-04-05 LAB — GLUCOSE, CAPILLARY: Glucose-Capillary: 162 mg/dL — ABNORMAL HIGH (ref 70–99)

## 2012-04-05 LAB — TSH: TSH: 1.052 u[IU]/mL (ref 0.350–4.500)

## 2012-04-05 MED ORDER — INSULIN GLARGINE 100 UNIT/ML ~~LOC~~ SOLN
22.0000 [IU] | Freq: Every day | SUBCUTANEOUS | Status: DC
  Administered 2012-04-06: 22 [IU] via SUBCUTANEOUS

## 2012-04-05 MED ORDER — FUROSEMIDE 40 MG PO TABS
40.0000 mg | ORAL_TABLET | Freq: Every day | ORAL | Status: DC
  Administered 2012-04-06: 40 mg via ORAL
  Filled 2012-04-05: qty 1

## 2012-04-05 MED ORDER — DOCUSATE SODIUM 100 MG PO CAPS
100.0000 mg | ORAL_CAPSULE | Freq: Two times a day (BID) | ORAL | Status: DC
  Administered 2012-04-05 – 2012-04-10 (×11): 100 mg via ORAL
  Filled 2012-04-05 (×11): qty 1

## 2012-04-05 NOTE — Progress Notes (Signed)
VASCULAR LAB PRELIMINARY  ARTERIAL  ABI completed:    RIGHT    LEFT    PRESSURE WAVEFORM  PRESSURE WAVEFORM  BRACHIAL 132 Triphasic BRACHIAL 140 Triphasic                              GREAT TOE 52 NA GREAT TOE      RIGHT LEFT  ABI N/A N/A   Pre physician orders patient unable to tolerate pressure to the leg for ABI ordered only right toe pressure with duplex scan.  Toe pressure indicates inadequate perfusion flor vascular healing. Duplex scan revealed multiple areas of moderate and severe plaque formation. There were two areas identified in th superficial femoral artery with a greater than 50% Stenosis. One being in the proximal region and the second in the distal region approximately 3 inches above the knee. The popliteal and posterior tibial arteries had limited image quality due to size and pain.   [name], 04/05/2012, 8:26 PM

## 2012-04-05 NOTE — Progress Notes (Addendum)
Subjective: Patient continues to have pain in her legs. She further noted that she normally does not take any iron tablets because it upset her stomach and states she does have other tablets which she uses on a regular basis.  Patient noted to me that she has been weak and has lost 7 pounds in the last one month unintentionally. Objective: Vital signs in last 24 hours: Filed Vitals:   04/04/12 1014 04/04/12 1317 04/04/12 2006 04/05/12 0435  BP: 138/69 107/59 108/56 139/64  Pulse: 68 58 65 63  Temp: 97.8 F (36.6 C) 97.8 F (36.6 C) 98.5 F (36.9 C) 98.4 F (36.9 C)  TempSrc: Oral Oral Oral Oral  Resp: 18 21 18 18   Height:   5\' 3"  (1.6 m)   Weight:   164 lb 15.9 oz (74.84 kg)   SpO2: 100% 99% 99% 97%   Weight change: -0.1 oz (-0.003 kg)  Intake/Output Summary (Last 24 hours) at 04/05/12 0838 Last data filed at 04/04/12 1300  Gross per 24 hour  Intake    360 ml  Output      0 ml  Net    360 ml   General: resting in bed, not in acute distress  HEENT: PERRL, EOMI, no scleral icterus  Cardiac: S1/S2, 2/6 systolic murmur left sternal border; no S3, no S4, RRR, No murmurs, gallops or rubs  Pulm: Good air movement bilaterally, Clear to auscultation bilaterally, No rales, wheezing, rhonchi or rubs.  Abd: Soft, nondistended, nontender, no rebound pain, no organomegaly, BS present  Ext: There is erythema and tenderness of the right leg and heel, with marked tenderness over the lower half of the right lateral leg. There are no obvious skin breaks, although there is thickening and scaling of the skin on the patient's sole. 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  Neuro: alert and oriented X3,     Lab Results: Basic Metabolic Panel:  Lab 04/05/12 1191 04/04/12 0610  NA 137 136  K 4.0 4.0  CL 100 99  CO2 26 25  GLUCOSE 99 172*  BUN 32* 31*  CREATININE 1.53* 1.44*  CALCIUM 9.0 9.0  MG  -- --  PHOS -- --   Liver Function Tests:  Lab 04/03/12 1616  AST 21  ALT 14  ALKPHOS 125*  BILITOT 0.6  PROT 7.1  ALBUMIN 3.1*    CBC:  Lab 04/04/12 0610 04/03/12 1616  WBC 6.2 7.6  NEUTROABS -- 5.2  HGB 9.5* 9.8*  HCT 31.5* 32.0*  MCV 72.1* 71.4*  PLT 295 304    CBG:  Lab 04/05/12 0750 04/04/12 2158 04/04/12 1627 04/04/12 1513 04/04/12 1138 04/04/12 0737  GLUCAP 86 197* 139* 96 130* 162*   Anemia Panel:  Lab 04/04/12 0610  VITAMINB12 180*  FOLATE >20.0  FERRITIN 16  TIBC 342  IRON 15*  RETICCTPCT 1.4    Micro Results: Recent Results (from the past 240 hour(s))  MRSA PCR SCREENING     Status: Abnormal   Collection Time   04/04/12  1:03 AM      Component Value Range Status Comment   MRSA by PCR POSITIVE (*) NEGATIVE Final    Studies/Results: Mr Tibia Fibula Right Wo Contrast  04/04/2012  *RADIOLOGY REPORT*  Clinical Data: Right leg pain and redness.  MRI OF LOWER RIGHT EXTREMITY WITHOUT CONTRAST  Technique:  Multiplanar, multisequence MR imaging of the lower right  extremity was performed. No intravenous contrast was administered.  Comparison: None.  Findings: There is diffuse subcutaneous soft tissue swelling/edema and fluid involving the entire right lower extremity.  Similar but less significant findings in the left leg.  No discrete soft tissue abscess.  No findings for osteomyelitis or septic arthritis.  No findings for myositis or myofasciitis.  IMPRESSION: Diffuse cellulitis but no findings for focal soft tissue abscess, osteomyelitis, septic arthritis or myofasciitis.  Original Report Authenticated By: P. Loralie Champagne, M.D.   Medications: I have reviewed the patient's current medications. Scheduled Meds:   . aspirin EC  325 mg Oral Daily  . atorvastatin  40 mg Oral QHS  . brimonidine  1 drop Both Eyes BID  . enoxaparin  40 mg Subcutaneous Q24H  . ezetimibe  10 mg Oral Daily  . ferrous sulfate  325 mg Oral TID WC  . furosemide  40 mg Oral QODAY  .  furosemide  60 mg Oral QODAY  . insulin aspart  0-5 Units Subcutaneous QHS  . insulin aspart  0-9 Units Subcutaneous TID WC  . insulin glargine  22 Units Subcutaneous Daily  . insulin glargine  8 Units Subcutaneous QHS  . isosorbide mononitrate  30 mg Oral Daily  . latanoprost  1 drop Both Eyes QHS  . levothyroxine  125 mcg Oral Q0600  . metoprolol succinate  25 mg Oral Daily  . mupirocin ointment  1 application Nasal BID  . pantoprazole  80 mg Oral Q1200  . piperacillin-tazobactam (ZOSYN)  IV  3.375 g Intravenous Q8H  . vancomycin  750 mg Intravenous Q24H   Continuous Infusions:  PRN Meds:.acetaminophen, acetaminophen, benzonatate, docusate sodium, HYDROcodone-acetaminophen, hydrOXYzine, nitroGLYCERIN Assessment/Plan:  # Right LE cellulitis: High risk given h/o PVD & uncontrolled DM. Skin is calloused with high risk of skin breaks. 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 DVT less likely as erythema is anterolateral with no posterior tenderness. Patient still has significant pain which does not allow even light touch. Swelling has also not improved per patient. It is concerning that patient may have deep infection.[name] Her PVD may also contribute to this severe pain.  MRI 6/21 showed Diffuse cellulitis but no findings for focal soft tissue abscess, osteomyelitis, septic arthritis or myofasciitis. Vascular surgeon was consulted . ABI could not be obtained secondary to pain  - Appreciate vascular assistance with management - Will continue IV Vancomycin and Zosyn to cover Pseudomonas/anaerobes   #Chronic Renal Failure stage 4  likely in the setting of uncontrolled DM : Patient at baseline Cr 1.4-1.5 , GFR = 36. -Monitor BMP   #Type 1 Uncontrolled DM on Insulin: Patient reports compliance with home insulin but does not have good control, nor is she willing to make big  lifestyle changes at present. Last A1c=10.6 in 10/2011.  -Repeat A1c  -Continue home dose insulin and monitor CBGs   #Chronic Microcytic Anemia: Patient's Hb is at baseline, but it does not appear to have been worked up in the past. her anemia panel: iron 15, TIBC 327, Ferritin 16, folate>20. These results indicate iron deficiency. At her age, iron deficiency is concerning for colon cancer. Vitamin B12 is low at 180.  - Methylmalonic acid pending -Holding iron supplement due to patient's intolerance. Patient does not recall the name of her home medication. Will call pharmacy to obtain further information. -Consider colonoscope as outpatient.  -pending  FOBT  -Patient will need to be established with a PCP and have outpatient colonoscopy   #h/o CAD: Stable without chest pain. Being managed medically, as intervention on LCx was not successful. She is not felt to be a good candidate for surgery d/t poor distal vessels. Most recent LDL = 42 on 11/13/11.  -Continue ASA, atorvastatin, metoprolol   #h/o Hypothyrodism: Last TSH on 11/12/11 was wnl (0.914). Patient noted fatigue and weight loss since one month. - Will obtain TSH -Continue home dose levothyroxine   #Chronic Systolic & Diastolic CHF: Not in acute exacerbation. Patient's last echo was on 11/14/11 revealing EF of 30-35% with Grade 2 diastolic dysfunction as well as AS. Patient is followed by [facility] cardiology (Dr. Riley Kill, [name])  -Strict I/Os, Daily weights  -Continue lasix at home dose 40 mg daily, Imdur,  Toprol  #VTE: Lovenox  # Dispo : Patient noted she would like to establish primary care at [facility].    LOS: 2 days   Roswell Ndiaye 04/05/2012, 8:38 AM

## 2012-04-05 NOTE — Consult Note (Signed)
I agree with the above assessment and plan. The patient was seen and examined today. She has a history of left leg tibial bypass and amputation which has healed. She has had new cellulitis in her right leg which is chronically edematous. Her edema is multifactorial in origin. Her saphenous vein was harvested from the right leg for CABG. She likely has a component of congestive failure as well as lower extremity venous insufficiency, as well as lymphedema which all continue to her cellulitis. On examination she does have biphasic Doppler signal in her posterior tibial and anterior tibial arteries. She is unwilling to have ankle-brachial indices performed because the blood pressure cuff is very painful for her. I have recommended that we get a arterial duplex to evaluate the waveforms in her right leg tibial vessels as well as toe pressures to assess her overall circulation. Further recommendations will be made based on these results. The patient also had an MRI of her leg which did not show any evidence of osteomyelitis or deep space infection. I would recommend leg elevation and compression to the point that she can tolerate as well as IV antibiotics to help resolve versus colitis. I will continue to follow her while she is in the hospital.  [name]

## 2012-04-06 DIAGNOSIS — M79609 Pain in unspecified limb: Secondary | ICD-10-CM | POA: Diagnosis not present

## 2012-04-06 DIAGNOSIS — E1159 Type 2 diabetes mellitus with other circulatory complications: Secondary | ICD-10-CM | POA: Diagnosis not present

## 2012-04-06 DIAGNOSIS — I739 Peripheral vascular disease, unspecified: Secondary | ICD-10-CM | POA: Diagnosis not present

## 2012-04-06 LAB — BASIC METABOLIC PANEL
BUN: 34 mg/dL — ABNORMAL HIGH (ref 6–23)
Chloride: 99 mEq/L (ref 96–112)
GFR calc Af Amer: 34 mL/min — ABNORMAL LOW (ref >90)
Glucose, Bld: 238 mg/dL — ABNORMAL HIGH (ref 70–99)
Potassium: 4.1 mEq/L (ref 3.5–5.1)

## 2012-04-06 LAB — CBC
HCT: 30.3 % — ABNORMAL LOW (ref 36.0–46.0)
Hemoglobin: 9.2 g/dL — ABNORMAL LOW (ref 12.0–15.0)
MCHC: 30.4 g/dL (ref 30.0–36.0)
RBC: 4.16 MIL/uL (ref 3.87–5.11)

## 2012-04-06 LAB — GLUCOSE, CAPILLARY
Glucose-Capillary: 193 mg/dL — ABNORMAL HIGH (ref 70–99)
Glucose-Capillary: 122 mg/dL — ABNORMAL HIGH (ref 70–99)
Glucose-Capillary: 134 mg/dL — ABNORMAL HIGH (ref 70–99)

## 2012-04-06 MED ORDER — CHLORHEXIDINE GLUCONATE CLOTH 2 % EX PADS
6.0000 | MEDICATED_PAD | Freq: Every day | CUTANEOUS | Status: DC
  Administered 2012-04-07 – 2012-04-09 (×3): 6 via TOPICAL

## 2012-04-06 MED ORDER — INSULIN GLARGINE 100 UNIT/ML ~~LOC~~ SOLN
25.0000 [IU] | Freq: Every day | SUBCUTANEOUS | Status: DC
  Administered 2012-04-07: 22 [IU] via SUBCUTANEOUS

## 2012-04-06 NOTE — Progress Notes (Signed)
Subjective: Patient reports improved pain in her leg and overall feels a lot better. Denies any chest pain, abdominal pain, SOB   Objective: Vital signs in last 24 hours: Filed Vitals:   04/05/12 1700 04/05/12 2113 04/06/12 0500 04/06/12 0900  BP: 118/37 108/54 112/62 99/62  Pulse: 74 69 72 66  Temp: 99.7 F (37.6 C) 98.7 F (37.1 C) 98.3 F (36.8 C) 97.8 F (36.6 C)  TempSrc: Oral Oral Oral Oral  Resp: 18 18 18 18   Height:      Weight:  163 lb 6.4 oz (74.118 kg)    SpO2: 100% 96% 98% 96%   Weight change: -1 lb 9.5 oz (-0.722 kg)  Intake/Output Summary (Last 24 hours) at 04/06/12 1205 Last data filed at 04/06/12 0900  Gross per 24 hour  Intake    880 ml  Output      0 ml  Net    880 ml   Physical exam  General: resting in bed, not in acute distress  HEENT: PERRL, EOMI, no scleral icterus  Cardiac: S1/S2, 2/6 systolic murmur left sternal border; no S3, no S4, RRR, No murmurs, gallops or rubs  Pulm: Good air movement bilaterally, Clear to auscultation bilaterally, No rales, wheezing, rhonchi or rubs.  Abd: Soft, nondistended, nontender, no rebound pain, no organomegaly, BS present  Ext: There is erythema and tenderness of the right leg and heel, with improved tenderness over the lower half of the right lateral leg. There are no obvious skin breaks, although there is thickening and scaling of the skin on the patient's shin . 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  Neuro: alert and oriented X3,     Lab Results: Basic Metabolic Panel:  Lab 04/06/12 4782 04/05/12 0555  NA 135 137  K 4.1 4.0  CL 99 100  CO2 26 26  GLUCOSE 238* 99  BUN 34* 32*  CREATININE 1.74* 1.53*  CALCIUM 8.9 9.0  MG -- --  PHOS -- --   Liver Function Tests:  Lab 04/03/12 1616  AST 21  ALT 14  ALKPHOS 125*  BILITOT 0.6  PROT 7.1  ALBUMIN 3.1*    CBC:  Lab 04/06/12 0835 04/04/12 0610  04/03/12 1616  WBC 4.3 6.2 --  NEUTROABS -- -- 5.2  HGB 9.2* 9.5* --  HCT 30.3* 31.5* --  MCV 72.8* 72.1* --  PLT 252 295 --   CBG:  Lab 04/06/12 0730 04/05/12 2128 04/05/12 1651 04/05/12 1201 04/05/12 0750 04/04/12 2158  GLUCAP 283* 162* 179* 101* 86 197*   Hemoglobin A1C:  Lab 04/05/12 0555  HGBA1C 10.2*    Thyroid Function Tests:  Lab 04/05/12 1212  TSH 1.052  T4TOTAL --  FREET4 --  T3FREE --  THYROIDAB --    Anemia Panel:  Lab 04/04/12 0610  VITAMINB12 180*  FOLATE >20.0  FERRITIN 16  TIBC 342  IRON 15*  RETICCTPCT 1.4    Micro Results: Recent Results (from the past 240 hour(s))  MRSA PCR SCREENING     Status: Abnormal   Collection Time   04/04/12  1:03 AM      Component Value Range Status Comment   MRSA by PCR POSITIVE (*) NEGATIVE Final    Studies/Results: Mr Tibia Fibula Right Wo Contrast  04/04/2012  *RADIOLOGY REPORT*  Clinical Data: Right leg pain and redness.  MRI OF LOWER RIGHT EXTREMITY WITHOUT CONTRAST  Technique:  Multiplanar, multisequence MR imaging of the lower right extremity was performed. No intravenous contrast was administered.  Comparison: None.  Findings: There is diffuse subcutaneous soft tissue swelling/edema and fluid involving the entire right lower extremity.  Similar but less significant findings in the left leg.  No discrete soft tissue abscess.  No findings for osteomyelitis or septic arthritis.  No findings for myositis or myofasciitis.  IMPRESSION: Diffuse cellulitis but no findings for focal soft tissue abscess, osteomyelitis, septic arthritis or myofasciitis.  Original Report Authenticated By: P. Loralie Champagne, M.D.   Mr Foot Right Wo Contrast  04/05/2012  *RADIOLOGY REPORT*  Clinical Data: Pain, swelling and redness.  MRI OF THE RIGHT FOREFOOT WITHOUT CONTRAST  Technique:  Multiplanar, multisequence MR imaging was performed. No intravenous contrast was administered.  Comparison: Right lower extremity MRI 04/04/2012.  Findings:  There is diffuse subcutaneous soft tissue swelling/edema and fluid suggesting cellulitis.  No focal fluid collection to suggest an abscess.  No findings for osteomyelitis or septic arthritis.  Edema like signal abnormality in the foot musculature suggesting myositis.  The medial and lateral ankle ligaments and tendons are intact.  The Achilles tendon and plantar fascia are intact.  Mild midfoot degenerative changes.  IMPRESSION: Findings suggest cellulitis and myositis but no focal soft tissue abscess, septic arthritis or osteomyelitis.  Original Report Authenticated By: P. Loralie Champagne, M.D.   Medications: I have reviewed the patient's current medications. Scheduled Meds:   . aspirin EC  325 mg Oral Daily  . atorvastatin  40 mg Oral QHS  . brimonidine  1 drop Both Eyes BID  . Chlorhexidine Gluconate Cloth  6 each Topical Q0600  . docusate sodium  100 mg Oral BID  . enoxaparin  40 mg Subcutaneous Q24H  . ezetimibe  10 mg Oral Daily  . furosemide  40 mg Oral Daily  . insulin aspart  0-5 Units Subcutaneous QHS  . insulin aspart  0-9 Units Subcutaneous TID WC  . insulin glargine  22 Units Subcutaneous Q breakfast  . insulin glargine  8 Units Subcutaneous QHS  . isosorbide mononitrate  30 mg Oral Daily  . latanoprost  1 drop Both Eyes QHS  . levothyroxine  125 mcg Oral Q0600  . metoprolol succinate  25 mg Oral Daily  . mupirocin ointment  1 application Nasal BID  . pantoprazole  80 mg Oral Q1200  . piperacillin-tazobactam (ZOSYN)  IV  3.375 g Intravenous Q8H  . vancomycin  750 mg Intravenous Q24H   Continuous Infusions:  PRN Meds:.acetaminophen, acetaminophen, benzonatate, HYDROcodone-acetaminophen, hydrOXYzine, nitroGLYCERIN Assessment/Plan:  # Right LE cellulitis - mildly improved (erythema present but decreased pain) : High risk given h/o PVD & uncontrolled DM. Skin is calloused with high risk of skin breaks. Patient does not appear to have a diabetic foot at this time, and when she  had an episode of osteomyelitis resulting in amputation, foot was gangrenous at presentation. DVT less likely as erythema is anterolateral with no posterior tenderness.MRI 6/21 showed Diffuse cellulitis but no findings for focal soft tissue abscess, osteomyelitis, septic arthritis or myofasciitis.  Duplex performed but not red yet. Per Vascular - no intervention as of now. Continue antibiotic therapy and if tolerating compression stocking. Patient does not want picc line or any surgical intervention if not needed   - Appreciate vascular assistance with management  - Will continue IV Vancomycin and Zosyn to cover Pseudomonas/anaerobes   #Chronic Renal Failure stage 4 likely in the setting of uncontrolled DM : Patient at baseline Cr  1.4-1.5 , GFR = 36. Creatinine up to 1.7 today - hold lasix for today since patient normally only takes it every other day  -Monitor BMP   #Type 1 Uncontrolled DM on Insulin: Patient reports compliance with home insulin but does not have good control, nor is she willing to make big lifestyle changes at present. Hgb A1c 10.2 - Will increase the morning Lantus from 22 units to 25 units and Continue with Lantus 8 units in the pm  -Continue SSI  #Chronic Microcytic Anemia: Patient's Hb is at baseline, but it does not appear to have been worked up in the past. her anemia panel: iron 15, TIBC 327, Ferritin 16, folate>20. These results indicate iron deficiency. At her age, iron deficiency is concerning for colon cancer. Vitamin B12 is low at 180.  - Methylmalonic acid pending  -Holding iron supplement due to patient's intolerance. Patient does not recall the name of her home medication. Will call pharmacy to obtain further information.  -Consider colonoscope as outpatient.  -pending FOBT  -Patient will need to be established with a PCP and have outpatient colonoscopy   #h/o CAD: Stable without chest pain. Being managed medically, as intervention on LCx was not successful. She  is not felt to be a good candidate for surgery d/t poor distal vessels. Most recent LDL = 42 on 11/13/11.  -Continue ASA, atorvastatin, metoprolol   #h/o Hypothyrodism:  TSH  Os 1.05 -Continue home dose levothyroxine   #Chronic Systolic & Diastolic CHF: Not in acute exacerbation. Patient's last echo was on 11/14/11 revealing EF of 30-35% with Grade 2 diastolic dysfunction as well as AS. Patient is followed by [facility] cardiology (Dr. Riley Kill, [name])  -Strict I/Os, Daily weights  -Continue  Imdur, Toprol  And holding lasix for today only.  - Consider to restart tomorrow with home regimen with every other day  #VTE: Lovenox   # Dispo : Patient noted she would like to establish primary care at [facility].    LOS: 3 days   [name] 04/06/2012, 12:05 PM

## 2012-04-06 NOTE — Progress Notes (Addendum)
[facility] [place]  Subjective  - New cellulitis in her right leg which is chronically edematous.  She wants to avoid a PICC line if at all possible.     Objective 112/62 72 98.3 F (36.8 C) (Oral) 18 98%  Intake/Output Summary (Last 24 hours) at 04/06/12 0956 Last data filed at 04/06/12 0640  Gross per 24 hour  Intake    760 ml  Output      0 ml  Net    760 ml   Right LE Doppler pulses PT/DP weak.   Decreased pain to palpation and less erythema. No open wounds.  Assessment/Planning: Pending results of ABI'S  Continue IV antibiotics. Right lower extremity cellulitis.  [name] [facility] 04/06/2012 9:56 AM --  Laboratory Lab Results:  Basename 04/06/12 0835 04/04/12 0610  WBC 4.3 6.2  HGB 9.2* 9.5*  HCT 30.3* 31.5*  PLT 252 295   BMET  Basename 04/06/12 0835 04/05/12 0555  NA 135 137  K 4.1 4.0  CL 99 100  CO2 26 26  GLUCOSE 238* 99  BUN 34* 32*  CREATININE 1.74* 1.53*  CALCIUM 8.9 9.0    COAG Lab Results  Component Value Date   INR 1.02 11/15/2011   INR 1.08 11/12/2011   INR 1.09 10/01/2011   No results found for this basename: PTT    Antibiotics Anti-infectives     Start     Dose/Rate Route Frequency Ordered Stop   04/04/12 2200   vancomycin (VANCOCIN) 750 mg in sodium chloride 0.9 % 150 mL IVPB        750 mg 150 mL/hr over 60 Minutes Intravenous Every 24 hours 04/04/12 0025     04/04/12 1500  piperacillin-tazobactam (ZOSYN) IVPB 3.375 g       3.375 g 12.5 mL/hr over 240 Minutes Intravenous 3 times per day 04/04/12 1418     04/03/12 2115   vancomycin (VANCOCIN) IVPB 1000 mg/200 mL premix        1,000 mg 200 mL/hr over 60 Minutes Intravenous  Once 04/03/12 2105 04/03/12 2231         I agree with the above. The patient was examined today on rounds. The pain in her right leg has decreased with IV antibiotics. The erythema on her right leg has not expanded. Her duplex was performed yesterday however I cannot  locate the results at this time. The patient does not wish to proceed with an arteriogram at this time if at all possible. Since she has improved with antibiotics and leg elevation I think it is reasonable to continue with this plan and if she fails , we can further evaluate her blood flow.  [name]

## 2012-04-07 DIAGNOSIS — L02419 Cutaneous abscess of limb, unspecified: Secondary | ICD-10-CM | POA: Diagnosis not present

## 2012-04-07 LAB — BASIC METABOLIC PANEL
GFR calc Af Amer: 31 mL/min — ABNORMAL LOW (ref >90)
GFR calc non Af Amer: 27 mL/min — ABNORMAL LOW (ref >90)
Glucose, Bld: 108 mg/dL — ABNORMAL HIGH (ref 70–99)
Potassium: 3.9 mEq/L (ref 3.5–5.1)
Sodium: 134 mEq/L — ABNORMAL LOW (ref 135–145)

## 2012-04-07 LAB — GLUCOSE, CAPILLARY: Glucose-Capillary: 204 mg/dL — ABNORMAL HIGH (ref 70–99)

## 2012-04-07 MED ORDER — ENOXAPARIN SODIUM 30 MG/0.3ML ~~LOC~~ SOLN
30.0000 mg | SUBCUTANEOUS | Status: DC
  Filled 2012-04-07 (×4): qty 0.3

## 2012-04-07 MED ORDER — INSULIN GLARGINE 100 UNIT/ML ~~LOC~~ SOLN
22.0000 [IU] | Freq: Every day | SUBCUTANEOUS | Status: DC
  Administered 2012-04-08 – 2012-04-09 (×2): 22 [IU] via SUBCUTANEOUS
  Administered 2012-04-10: 20 [IU] via SUBCUTANEOUS

## 2012-04-07 NOTE — Progress Notes (Signed)
Subjective:   Patient reports improved pain in her leg and overall feels better. Denies any chest pain, abdominal pain, SOB. She is afebrile. She still has mild dry cough.   Objective: Vital signs in last 24 hours: Filed Vitals:   04/06/12 0900 04/06/12 1405 04/06/12 2049 04/07/12 0508  BP: 99/62 108/62 121/49 121/41  Pulse: 66 69 74 74  Temp: 97.8 F (36.6 C) 98.4 F (36.9 C) 99 F (37.2 C) 98.6 F (37 C)  TempSrc: Oral Oral Oral Oral  Resp: 18 19 18 18   Height:      Weight:   164 lb 14.5 oz (74.8 kg)   SpO2: 96% 98% 100% 99%   Weight change: 1 lb 8.1 oz (0.682 kg)  Intake/Output Summary (Last 24 hours) at 04/07/12 0824 Last data filed at 04/06/12 1300  Gross per 24 hour  Intake    360 ml  Output      0 ml  Net    360 ml   Physical exam  General: resting in bed, not in acute distress  HEENT: PERRL, EOMI, no scleral icterus  Cardiac: S1/S2, 2/6 systolic murmur left sternal border; no S3, no S4, RRR, No murmurs, gallops or rubs  Pulm: Good air movement bilaterally, Clear to auscultation bilaterally, No rales, wheezing, rhonchi or rubs.  Abd: Soft, nondistended, nontender, no rebound pain, no organomegaly, BS present  Ext: There is erythema and tenderness of the right leg and heel, with improved tenderness over the lower half of the right lateral leg. There are no obvious skin breaks, although there is thickening and scaling of the skin on the patient's shin . 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  Neuro: alert and oriented X3,   Lab Results: Basic Metabolic Panel:  Lab 04/07/12 1610 04/06/12 0835  NA 134* 135  K 3.9 4.1  CL 98 99  CO2 26 26  GLUCOSE 108* 238*  BUN 37* 34*  CREATININE 1.88* 1.74*  CALCIUM 8.9 8.9  MG -- --  PHOS -- --   Liver Function Tests:  Lab 04/03/12 1616  AST 21  ALT 14  ALKPHOS 125*  BILITOT 0.6  PROT 7.1  ALBUMIN 3.1*     CBC:  Lab 04/06/12 0835 04/04/12 0610 04/03/12 1616  WBC 4.3 6.2 --  NEUTROABS -- -- 5.2  HGB 9.2* 9.5* --  HCT 30.3* 31.5* --  MCV 72.8* 72.1* --  PLT 252 295 --   CBG:  Lab 04/07/12 0743 04/06/12 2044 04/06/12 1633 04/06/12 1240 04/06/12 0730 04/05/12 2128  GLUCAP 97 134* 122* 193* 283* 162*   Hemoglobin A1C:  Lab 04/05/12 0555  HGBA1C 10.2*    Thyroid Function Tests:  Lab 04/05/12 1212  TSH 1.052  T4TOTAL --  FREET4 --  T3FREE --  THYROIDAB --    Anemia Panel:  Lab 04/04/12 0610  VITAMINB12 180*  FOLATE >20.0  FERRITIN 16  TIBC 342  IRON 15*  RETICCTPCT 1.4    Micro Results: Recent Results (from the past 240 hour(s))  MRSA PCR SCREENING     Status: Abnormal   Collection Time   04/04/12  1:03 AM      Component Value Range Status Comment   MRSA by PCR POSITIVE (*) NEGATIVE Final    Studies/Results: No results found. Medications: I have reviewed the patient's current medications. Scheduled Meds:    . aspirin EC  325 mg  Oral Daily  . atorvastatin  40 mg Oral QHS  . brimonidine  1 drop Both Eyes BID  . Chlorhexidine Gluconate Cloth  6 each Topical Q0600  . docusate sodium  100 mg Oral BID  . enoxaparin  40 mg Subcutaneous Q24H  . ezetimibe  10 mg Oral Daily  . insulin aspart  0-5 Units Subcutaneous QHS  . insulin aspart  0-9 Units Subcutaneous TID WC  . insulin glargine  25 Units Subcutaneous Q breakfast  . insulin glargine  8 Units Subcutaneous QHS  . isosorbide mononitrate  30 mg Oral Daily  . latanoprost  1 drop Both Eyes QHS  . levothyroxine  125 mcg Oral Q0600  . metoprolol succinate  25 mg Oral Daily  . mupirocin ointment  1 application Nasal BID  . pantoprazole  80 mg Oral Q1200  . piperacillin-tazobactam (ZOSYN)  IV  3.375 g Intravenous Q8H  . vancomycin  750 mg Intravenous Q24H  . DISCONTD: furosemide  40 mg Oral Daily  . DISCONTD: insulin glargine  22 Units Subcutaneous Q breakfast   Continuous Infusions:  PRN  Meds:.acetaminophen, acetaminophen, benzonatate, HYDROcodone-acetaminophen, hydrOXYzine, nitroGLYCERIN Assessment/Plan:  # Right LE cellulitis - Mildly improved (erythema present but decreased pain) : High risk given h/o PVD & uncontrolled DM. Skin is calloused with high risk of skin breaks. 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 DVT less likely as erythema is anterolateral with no posterior tenderness. MRI 6/21 showed Diffuse cellulitis but no findings for focal soft tissue abscess, osteomyelitis, septic arthritis or myofasciitis.  Duplex performed but not red yet. Per Vascular - no intervention as of now. Continue antibiotic therapy and if tolerating compression stocking. Patient does not want picc line or any surgical intervention if possible.    - Appreciate vascular assistance with management  - Will continue IV Vancomycin and Zosyn to cover Pseudomonas/anaerobes   #Chronic Renal Failure stage 4 likely in the setting of uncontrolled DM : Patient at baseline Cr 1.2 to 1.7 in last year , GFR = 36. Creatinine up to 1.88 today  - hold lasix for today since patient normally only takes it every other day  -Monitor BMP   #Type 1 Uncontrolled DM on Insulin: Patient reports compliance with home insulin but does not have good control, nor is she willing to make big lifestyle changes at present. Hgb A1c 10.2 - Increased the morning Lantus from 22 units to 25 units on 6/23 and Continue with Lantus 8 units in the pm  -Continue SSI  #Chronic Microcytic Anemia: Patient's Hb is at baseline, but it does not appear to have been worked up in the past. her anemia panel: iron 15, TIBC 327, Ferritin 16, folate>20. These results indicate iron deficiency. At her age, iron deficiency is concerning for colon cancer. Vitamin B12 is low at 180.  -Methylmalonic acid pending  -Holding iron  supplement due to patient's intolerance. Patient does not recall the name of her home medication. Will call pharmacy to obtain further information.  -Consider colonoscope as outpatient.  -Pending FOBT  -Patient will need to be established with a PCP and have outpatient colonoscopy   #h/o CAD: Stable without chest pain. Being managed medically, as intervention on LCx was not successful. She is not felt to be a good candidate for surgery d/t poor distal vessels. Most recent LDL = 42 on 11/13/11.  -Continue ASA, atorvastatin,  metoprolol   #h/o Hypothyrodism:  TSH 1.05.  -Continue home dose levothyroxine   #Chronic Systolic & Diastolic CHF: Not in acute exacerbation. Patient's last echo was on 11/14/11 revealing EF of 30-35% with Grade 2 diastolic dysfunction as well as AS. Patient is followed by [facility] cardiology (Dr. Riley Kill, [name]).  - Strict I/Os, Daily weights  - Continue  Imdur, Toprol  And holding lasix for today only.  - Consider to restart tomorrow with home regimen with every other day  #VTE: Lovenox   # Dispo : Patient noted she would like to establish primary care at [facility].    LOS: 4 days   [name] 04/07/2012, 8:24 AM

## 2012-04-07 NOTE — Consult Note (Signed)
ANTIBIOTIC CONSULT NOTE - FOLLOW UP  Pharmacy Consult for Vancomycin, Zosyn Indication: Cellulitis  Allergies  Allergen Reactions  . Adhesive (Tape) Other (See Comments)    "takes my skin off"  . Cephalexin Swelling and Rash  . Ciprofloxacin Swelling and Rash    Patient Measurements: Height: 5\' 3"  (160 cm) Weight: 164 lb 14.5 oz (74.8 kg) IBW/kg (Calculated) : 52.4    Vital Signs: Temp Readings from Last 3 Encounters:  04/07/12 98.3 F (36.8 C) Oral  11/17/11 98.6 F (37 C) Oral  11/17/11 98.6 F (37 C) Oral    Intake/Output from previous day: 06/23 0701 - 06/24 0700 In: 360 [P.O.:360] Out: -   Labs:  Basename 04/07/12 0549 04/06/12 0835 04/05/12 0555  WBC -- 4.3 --  HGB -- 9.2* --  PLT -- 252 --  LABCREA -- -- --  CREATININE 1.88* 1.74* 1.53*   Estimated Creatinine Clearance: 28.5 ml/min (by C-G formula based on Cr of 1.88).   Vancomycin trough: 14.6 mcg/ml 04/07/12 @ 21:30pm   Microbiology: Recent Results (from the past 720 hour(s))  MRSA PCR SCREENING     Status: Abnormal   Collection Time   04/04/12  1:03 AM      Component Value Range Status Comment   MRSA by PCR POSITIVE (*) NEGATIVE Final     Anti-infectives Anti-infectives     Start     Dose/Rate Route Frequency Ordered Stop   04/04/12 2200   vancomycin (VANCOCIN) 750 mg in sodium chloride 0.9 % 150 mL IVPB        750 mg 150 mL/hr over 60 Minutes Intravenous Every 24 hours 04/04/12 0025     04/04/12 1500  piperacillin-tazobactam (ZOSYN) IVPB 3.375 g       3.375 g 12.5 mL/hr over 240 Minutes Intravenous 3 times per day 04/04/12 1418     04/03/12 2115   vancomycin (VANCOCIN) IVPB 1000 mg/200 mL premix        1,000 mg 200 mL/hr over 60 Minutes Intravenous  Once 04/03/12 2105 04/03/12 2231          Assessment:  Day # 4 Vancomycin and Zosyn for R-leg cellulitis.  Vancomycin trough level 14.6 mcg/ml which is in therapeutic range.  WBC 4.3, Afebrile.  Goal of Therapy:   Vancomycin  trough level 10-15 mcg/ml  Plan:   Continue Vancomycin 750 mg IV q 24 hours.  Continue Zosyn 3.375 gm IV q 8 hours.  Follow up renal function.  Will adjust antibiotics as required.   Kingslee Mairena, Elisha Headland, Pharm.D.  04/07/2012 10:24 PM

## 2012-04-07 NOTE — Progress Notes (Signed)
[facility] [place]  Subjective  New cellulitis in her right leg which is chronically edematous. She wants to avoid a PICC line if at all possible.  We will do watchful waiting.       Objective 115/59 77 98.7 F (37.1 C) (Oral) 20 98%  Intake/Output Summary (Last 24 hours) at 04/07/12 1431 Last data filed at 04/07/12 0900  Gross per 24 hour  Intake    240 ml  Output      0 ml  Net    240 ml    Doppler pulses PT/DP weak.  Right LE erythema unchanged, but not as painful to palpation.  Assessment/Planning: Continue antibiotics. Start use of compression hoses secondary to holding lasix because creatinine has increased since hospitalization.    [name] [facility] 04/07/2012 2:31 PM --  Laboratory Lab Results:  [facility] 04/06/12 0835  WBC 4.3  HGB 9.2*  HCT 30.3*  PLT 252   BMET  Basename 04/07/12 0549 04/06/12 0835  NA 134* 135  K 3.9 4.1  CL 98 99  CO2 26 26  GLUCOSE 108* 238*  BUN 37* 34*  CREATININE 1.88* 1.74*  CALCIUM 8.9 8.9    COAG Lab Results  Component Value Date   INR 1.02 11/15/2011   INR 1.08 11/12/2011   INR 1.09 10/01/2011   No results found for this basename: PTT    Antibiotics Anti-infectives     Start     Dose/Rate Route Frequency Ordered Stop   04/04/12 2200   vancomycin (VANCOCIN) 750 mg in sodium chloride 0.9 % 150 mL IVPB        750 mg 150 mL/hr over 60 Minutes Intravenous Every 24 hours 04/04/12 0025     04/04/12 1500  piperacillin-tazobactam (ZOSYN) IVPB 3.375 g       3.375 g 12.5 mL/hr over 240 Minutes Intravenous 3 times per day 04/04/12 1418     04/03/12 2115   vancomycin (VANCOCIN) IVPB 1000 mg/200 mL premix        1,000 mg 200 mL/hr over 60 Minutes Intravenous  Once 04/03/12 2105 04/03/12 2231

## 2012-04-07 NOTE — Progress Notes (Signed)
Internal Medicine Attending  Date: 04/07/2012  Patient name: [name] Medical record number: [id number] Date of birth: 12-30-1945 Age: 67 y.o. Gender: female  I saw and evaluated the patient. I reviewed the resident's note by Dr. Clyde Lundborg and I agree with the resident's findings and plans as documented in his note, with the following additional comments.  Patient's right lateral leg and right ankle are less tender, but she still has pronounced tenderness in those areas; the erythema is somewhat better, but still present.  Agree with plans to continue current broad-spectrum IV antibiotic therapy; would get input from ID regarding duration of treatment and transition to oral regimen.  Patient does not want her home Lantus dose changed, and stated that she refused to take the increased dose of Lantus insulin.  It may be helpful to talk with her endocrinologist about the long-term management of her diabetes.

## 2012-04-08 DIAGNOSIS — L02419 Cutaneous abscess of limb, unspecified: Secondary | ICD-10-CM | POA: Diagnosis not present

## 2012-04-08 DIAGNOSIS — L03119 Cellulitis of unspecified part of limb: Secondary | ICD-10-CM

## 2012-04-08 DIAGNOSIS — E109 Type 1 diabetes mellitus without complications: Secondary | ICD-10-CM | POA: Diagnosis not present

## 2012-04-08 LAB — BASIC METABOLIC PANEL
BUN: 37 mg/dL — ABNORMAL HIGH (ref 6–23)
CO2: 26 mEq/L (ref 19–32)
GFR calc non Af Amer: 28 mL/min — ABNORMAL LOW (ref >90)
Glucose, Bld: 67 mg/dL — ABNORMAL LOW (ref 70–99)
Potassium: 3.8 mEq/L (ref 3.5–5.1)
Sodium: 136 mEq/L (ref 135–145)

## 2012-04-08 LAB — GLUCOSE, CAPILLARY
Glucose-Capillary: 65 mg/dL — ABNORMAL LOW (ref 70–99)
Glucose-Capillary: 93 mg/dL (ref 70–99)
Glucose-Capillary: 209 mg/dL — ABNORMAL HIGH (ref 70–99)
Glucose-Capillary: 191 mg/dL — ABNORMAL HIGH (ref 70–99)
Glucose-Capillary: 168 mg/dL — ABNORMAL HIGH (ref 70–99)

## 2012-04-08 MED ORDER — SODIUM CHLORIDE 0.9 % IV SOLN
INTRAVENOUS | Status: AC
  Administered 2012-04-08: 12:00:00 via INTRAVENOUS

## 2012-04-08 NOTE — Progress Notes (Signed)
Internal Medicine Attending  Date: 04/08/2012  Patient name: [name] Medical record number: [id number] Date of birth: [birth year] Age: 66 y.o. Gender: female  I saw and evaluated the patient. I reviewed the resident's note by Dr. Clyde Lundborg and I agree with the resident's findings and plans as documented in his note.  Patient reports some improvement in her right lower extremity tenderness; exam still shows erythema of the leg, and moderate tenderness on the lateral aspect of her lower leg and ankle.  Agree with plan to consult ID regarding best choice of antibiotics.

## 2012-04-08 NOTE — Progress Notes (Signed)
CBG: 65  Treatment: 15 GM carbohydrate snack  Symptoms: Shaky and None  Follow-up CBG: Time:0640 CBG Result:93  Possible Reasons for Event: Medication regimen: Pt was hesitant to recieve HS coverage, but agreed with taking,   Comments/MD notified:per protocol    [name], Sharmon Leyden

## 2012-04-08 NOTE — Progress Notes (Signed)
Due to history and safety protocol we discussed with pt. New IV needed and placement on side opposite of mastectomy. Pt stated that she preferred IV in left arm and was that her cardiologist cleared her to have Bp, and iv done in that arm.

## 2012-04-08 NOTE — Progress Notes (Signed)
Subjective:   Patient reports improvement in her leg pain and overall feels OK. Denies any chest pain, abdominal pain, SOB. She is afebrile. She still has mild dry cough.   Objective: Vital signs in last 24 hours: Filed Vitals:   04/07/12 1800 04/07/12 2106 04/08/12 0549 04/08/12 1000  BP: 109/49 148/76 122/66 126/50  Pulse: 70 91 73 74  Temp: 98.6 F (37 C) 98.3 F (36.8 C) 98.1 F (36.7 C) 99 F (37.2 C)  TempSrc: Oral Oral Oral Oral  Resp: 20 20 20 20   Height:  5\' 3"  (1.6 m)    Weight:  164 lb 14.5 oz (74.8 kg)    SpO2: 100% 100% 100% 100%   Weight change: 0 lb (0 kg)  Intake/Output Summary (Last 24 hours) at 04/08/12 1223 Last data filed at 04/08/12 1100  Gross per 24 hour  Intake    120 ml  Output    400 ml  Net   -280 ml   Physical exam  General: resting in bed, not in acute distress  HEENT: PERRL, EOMI, no scleral icterus  Cardiac: S1/S2, 2/6 systolic murmur left sternal border; no S3, no S4, RRR, No murmurs, gallops or rubs  Pulm: Good air movement bilaterally, Clear to auscultation bilaterally, No rales, wheezing, rhonchi or rubs.  Abd: Soft, nondistended, nontender, no rebound pain, no organomegaly, BS present  Ext: There is erythema and tenderness of the right leg and heel, with improved tenderness over the lower half of the right lateral leg. There are no obvious skin breaks, although there is thickening and scaling of the skin on the patient's shin . 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  Neuro: alert and oriented X3,   Lab Results: Basic Metabolic Panel:  Lab 04/08/12 1610 04/07/12 0549  NA 136 134*  K 3.8 3.9  CL 99 98  CO2 26 26  GLUCOSE 67* 108*  BUN 37* 37*  CREATININE 1.83* 1.88*  CALCIUM 8.9 8.9  MG -- --  PHOS -- --   Liver Function Tests:  Lab 04/03/12 1616  AST 21  ALT 14  ALKPHOS 125*  BILITOT 0.6  PROT 7.1  ALBUMIN 3.1*     CBC:  Lab 04/06/12 0835 04/04/12 0610 04/03/12 1616  WBC 4.3 6.2 --  NEUTROABS -- -- 5.2  HGB 9.2* 9.5* --  HCT 30.3* 31.5* --  MCV 72.8* 72.1* --  PLT 252 295 --   CBG:  Lab 04/08/12 1126 04/08/12 0748 04/08/12 0638 04/08/12 0616 04/07/12 2138 04/07/12 1653  GLUCAP 209* 118* 93 65* 233* 204*   Hemoglobin A1C:  Lab 04/05/12 0555  HGBA1C 10.2*    Thyroid Function Tests:  Lab 04/05/12 1212  TSH 1.052  T4TOTAL --  FREET4 --  T3FREE --  THYROIDAB --    Anemia Panel:  Lab 04/04/12 0610  VITAMINB12 180*  FOLATE >20.0  FERRITIN 16  TIBC 342  IRON 15*  RETICCTPCT 1.4    Micro Results: Recent Results (from the past 240 hour(s))  MRSA PCR SCREENING     Status: Abnormal   Collection Time   04/04/12  1:03 AM      Component Value Range Status Comment   MRSA by PCR POSITIVE (*) NEGATIVE Final    Studies/Results: No results found. Medications: I have reviewed the patient's current medications. Scheduled Meds:    . aspirin EC  325 mg Oral Daily  .  atorvastatin  40 mg Oral QHS  . brimonidine  1 drop Both Eyes BID  . Chlorhexidine Gluconate Cloth  6 each Topical Q0600  . docusate sodium  100 mg Oral BID  . enoxaparin  30 mg Subcutaneous Q24H  . ezetimibe  10 mg Oral Daily  . insulin aspart  0-5 Units Subcutaneous QHS  . insulin aspart  0-9 Units Subcutaneous TID WC  . insulin glargine  22 Units Subcutaneous Q breakfast  . insulin glargine  8 Units Subcutaneous QHS  . isosorbide mononitrate  30 mg Oral Daily  . latanoprost  1 drop Both Eyes QHS  . levothyroxine  125 mcg Oral Q0600  . metoprolol succinate  25 mg Oral Daily  . mupirocin ointment  1 application Nasal BID  . pantoprazole  80 mg Oral Q1200  . piperacillin-tazobactam (ZOSYN)  IV  3.375 g Intravenous Q8H  . vancomycin  750 mg Intravenous Q24H   Continuous Infusions:    . sodium chloride 50 mL/hr at 04/08/12 1149   PRN Meds:.acetaminophen, acetaminophen, benzonatate,  HYDROcodone-acetaminophen, hydrOXYzine, nitroGLYCERIN Assessment/Plan:  # Right LE cellulitis - Mildly improved (erythema present but decreased pain) : High risk given h/o PVD & uncontrolled DM. Skin is calloused with high risk of skin breaks. 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 DVT less likely as erythema is anterolateral with no posterior tenderness. MRI 6/21 showed Diffuse cellulitis but no findings for focal soft tissue abscess, osteomyelitis, septic arthritis or myofasciitis.  Duplex performed but not red yet. Per Vascular - no intervention as of now. Continue antibiotic therapy and if tolerating compression stocking. Patient does not want picc line or any surgical intervention if possible.    - Appreciate vascular assistance with management  - Will continue IV Vancomycin and Zosyn to cover Pseudomonas/anaerobes  - will consult ID and follow up recommendations  #Chronic Renal Failure stage 4 likely in the setting of uncontrolled DM : Patient at baseline Cr 1.2 to 1.7 in last year , GFR = 36. Creatinine up to 1.83  - hold lasix for today since patient normally only takes it every other day - will give gentle IVF NS 50 cc/h  -Monitor BMP   #Type 1 Uncontrolled DM on Insulin: Patient reports compliance with home insulin but does not have good control, nor is she willing to make big lifestyle changes at present. Hgb A1c 10.2 -Continue Lantus from 22 units AM and Continue with Lantus 8 units in the pm  -Continue SSI  #Chronic Microcytic Anemia: Patient's Hb is at baseline, but it does not appear to have been worked up in the past. her anemia panel: iron 15, TIBC 327, Ferritin 16, folate>20. These results indicate iron deficiency. At her age, iron deficiency is concerning for colon cancer. Vitamin B12 is low at 180.  -Methylmalonic acid pending   -Holding iron  supplement due to patient's intolerance. Patient does not recall the name of her home medication. Will call pharmacy to obtain further information.  -Consider colonoscope as outpatient.  -Pending FOBT  -Patient will need to be established with a PCP and have outpatient colonoscopy   #h/o CAD: Stable without chest pain. Being managed medically, as intervention on LCx was not successful. She is not felt to be a good candidate for surgery d/t poor distal vessels. Most recent LDL = 42 on 11/13/11.  -Continue ASA, atorvastatin, metoprolol   #  h/o Hypothyrodism:  TSH 1.05.  -Continue home dose levothyroxine   #Chronic Systolic & Diastolic CHF: Not in acute exacerbation. Patient's last echo was on 11/14/11 revealing EF of 30-35% with Grade 2 diastolic dysfunction as well as AS. Patient is followed by [facility] cardiology (Dr. Riley Kill, [name]).  - Strict I/Os, Daily weights  - Continue  Imdur, Toprol  And holding lasix for today due to Cre 1.83   #VTE: Lovenox   # Dispo : Patient noted she would like to establish primary care at [facility].    LOS: 5 days   [name] 04/08/2012, 12:23 PM

## 2012-04-08 NOTE — Consult Note (Signed)
Infectious Diseases Initial Consultation  Reason for Consultation:  Cellulitis in a diabetic with past history of MRSA infection   HPI: [name] is a 66 y.o. 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 She reports having an episode of chills/fever on evening of presentation to ED. She first noticed that she had a red tender area of her right shin that progressively became tender, warm to touch, painful to touch. The erythema continued to spread over the course of the week. She deferred initially going to the doctor due to going out of town for a long awaited family reunion that she had planned. Since the symptoms progressed while on her vacation, she sought medical care once she returned home. She denies rigors, nightsweats, n/v/diarrhea. No other areas of rash. She said that she is unable to tell if she has worseining swelling of lower extremities since she had LE edema at baseline from her prior SVG harvest for CABG. She denies any recent trauma, animal scratch, scratches from plants as inciting event. She has not had recent antibiotics. 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 She has had osteo/gangrenous toes to her left foot with Group B strep, and pseudomonas in 2010-2011 s/p amputaiton.   Since being admitted and on IV antibiotics ,she states that the erythema has greatly improved as well as the not being as tender. Swelling unchanged. No fever/chills/nightsweats.  Past Medical History  Diagnosis Date  . HTN (hypertension)   . Hypothyroidism   . Diabetes mellitus, type 2     insulin dependent  . History of recurrent UTIs   . CAD (coronary artery disease)     s/p CABG x3 in 1993; last cath in 2010  .  Scleroderma   . Bilateral carpal tunnel syndrome 1970s  . 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 Followed by [name]  . Arthritis   . Anemia   . Claudication   . Aortic stenosis     0.8cm2 on echo in 10/2011  . Hyperlipidemia     on Lipitor  . Myocardial infarction 1986  . Seizure     ? seizure like event in 2010; workup included stress testing which led to repeat cath.   . Cat bite 2009    with MRSA; required I & D  . Heart murmur   . Shortness of breath   . Cancer     breast cancer  . Blood transfusion     no reaction from transfusion  . GERD (gastroesophageal reflux disease)     Allergies:  Allergies  Allergen Reactions  . Adhesive (Tape) Other (See Comments)    "takes my skin off"  . Cephalexin Swelling and Rash  . Ciprofloxacin Swelling and Rash    Current antibiotics: vanco #5 piptazo #5  MEDICATIONS:    . aspirin EC  325 mg Oral Daily  . atorvastatin  40 mg Oral QHS  . brimonidine  1 drop Both Eyes BID  . Chlorhexidine Gluconate Cloth  6 each Topical Q0600  . docusate sodium  100 mg Oral BID  . enoxaparin  30 mg Subcutaneous Q24H  . ezetimibe  10 mg Oral Daily  .  insulin aspart  0-5 Units Subcutaneous QHS  . insulin aspart  0-9 Units Subcutaneous TID WC  . insulin glargine  22 Units Subcutaneous Q breakfast  . insulin glargine  8 Units Subcutaneous QHS  . isosorbide mononitrate  30 mg Oral Daily  . latanoprost  1 drop Both Eyes QHS  . levothyroxine  125 mcg Oral Q0600  . metoprolol succinate  25 mg Oral Daily  . mupirocin ointment  1 application Nasal BID  . pantoprazole  80 mg Oral Q1200  . piperacillin-tazobactam (ZOSYN)  IV  3.375 g Intravenous Q8H  . vancomycin  750 mg Intravenous Q24H    History  Substance Use Topics  . Smoking status: Never Smoker   . Smokeless tobacco: Never Used  . Alcohol Use: No    Family History  Problem Relation Age of Onset  . Diabetes Mother      Review of Systems  Constitutional: Negative for fever, chills, diaphoresis, activity change, appetite change, fatigue and unexpected weight change.  HENT: Negative for congestion, sore throat, rhinorrhea, sneezing, trouble swallowing and sinus pressure.  Eyes: Negative for photophobia and visual disturbance.  Respiratory: Negative for cough, chest tightness, shortness of breath, wheezing and stridor.  Cardiovascular: Negative for chest pain, palpitations and leg swelling.  Gastrointestinal: Negative for nausea, vomiting, abdominal pain, diarrhea, constipation, blood in stool, abdominal distention and anal bleeding.  Genitourinary: Negative for dysuria, hematuria, flank pain and difficulty urinating.  Musculoskeletal: Negative for myalgias, back pain, joint swelling, arthralgias and gait problem.  Skin: per hpi, dry legs at baseline Neurological: Negative for dizziness, tremors, weakness and light-headedness.  Hematological: Negative for adenopathy. Does not bruise/bleed easily.  Psychiatric/Behavioral: Negative for behavioral problems, confusion, sleep disturbance, dysphoric mood, decreased concentration and agitation.    OBJECTIVE: Temp:  [98.1 F (36.7 C)-99 F (37.2 C)] 98.2 F (36.8 C) (06/25 1800) Pulse Rate:  [65-91] 65  (06/25 1800) Resp:  [18-20] 18  (06/25 1800) BP: (118-148)/(50-76) 118/55 mmHg (06/25 1800) SpO2:  [100 %] 100 % (06/25 1800) Weight:  [164 lb 14.5 oz (74.8 kg)] 164 lb 14.5 oz (74.8 kg) (06/24 2106)  General: standing at bedside, no acute distress, cooperative to exam  HEENT: PERRL, EOMI, no scleral icterus, no conjunctival pallor  Cardiac: RRR, 2/6 systolic murmur heard best at right 2nd intercostal space  Pulm: clear to auscultation bilaterally, moving normal volumes of air  Abd: soft, nontender, nondistended, BS normoactive  Ext: warm and well perfused, b/l LE+2 edema, 4cm lipoma on left lateral forearm, left foot s/p transmetatarsal amputation with  clean stump, decreased erythema of right lateral shin extending up through ankle and lateral lower leg (demarcated with bodypen), full ROM of right ankle, but painful on mvmt, no apparent bulla or exudates or open wound, nor is there any crepitus on exam, skin of b/l feet is diffusely calloused without ulcer  Neuro: alert and oriented X3, cranial nerves II-XII grossly intact  LABS: Results for orders placed during the hospital encounter of 04/03/12 (from the past 48 hour(s))  GLUCOSE, CAPILLARY     Status: Abnormal   Collection Time   04/06/12  8:44 PM      Component Value Range Comment   Glucose-Capillary 134 (*) 70 - 99 mg/dL   BASIC METABOLIC PANEL     Status: Abnormal   Collection Time   04/07/12  5:49 AM      Component Value Range Comment   Sodium 134 (*) 135 - 145 mEq/L    Potassium 3.9  3.5 -  5.1 mEq/L    Chloride 98  96 - 112 mEq/L    CO2 26  19 - 32 mEq/L    Glucose, Bld 108 (*) 70 - 99 mg/dL    BUN 37 (*) 6 - 23 mg/dL    Creatinine, Ser 1.61 (*) 0.50 - 1.10 mg/dL    Calcium 8.9  8.4 - 09.6 mg/dL    GFR calc non Af Amer 27 (*) >90 mL/min    GFR calc Af Amer 31 (*) >90 mL/min   GLUCOSE, CAPILLARY     Status: Normal   Collection Time   04/07/12  7:43 AM      Component Value Range Comment   Glucose-Capillary 97  70 - 99 mg/dL    Comment 1 Documented in Chart      Comment 2 Notify RN     GLUCOSE, CAPILLARY     Status: Normal   Collection Time   04/07/12 12:01 PM      Component Value Range Comment   Glucose-Capillary 94  70 - 99 mg/dL    Comment 1 Documented in Chart      Comment 2 Notify RN     GLUCOSE, CAPILLARY     Status: Abnormal   Collection Time   04/07/12  4:53 PM      Component Value Range Comment   Glucose-Capillary 204 (*) 70 - 99 mg/dL    Comment 1 Notify RN      Comment 2 Documented in Chart     VANCOMYCIN, TROUGH     Status: Normal   Collection Time   04/07/12  9:17 PM      Component Value Range Comment   Vancomycin Tr 14.6  10.0 - 20.0 ug/mL   GLUCOSE,  CAPILLARY     Status: Abnormal   Collection Time   04/07/12  9:38 PM      Component Value Range Comment   Glucose-Capillary 233 (*) 70 - 99 mg/dL   BASIC METABOLIC PANEL     Status: Abnormal   Collection Time   04/08/12  6:00 AM      Component Value Range Comment   Sodium 136  135 - 145 mEq/L    Potassium 3.8  3.5 - 5.1 mEq/L    Chloride 99  96 - 112 mEq/L    CO2 26  19 - 32 mEq/L    Glucose, Bld 67 (*) 70 - 99 mg/dL    BUN 37 (*) 6 - 23 mg/dL    Creatinine, Ser 0.45 (*) 0.50 - 1.10 mg/dL    Calcium 8.9  8.4 - 40.9 mg/dL    GFR calc non Af Amer 28 (*) >90 mL/min    GFR calc Af Amer 32 (*) >90 mL/min   GLUCOSE, CAPILLARY     Status: Abnormal   Collection Time   04/08/12  6:16 AM      Component Value Range Comment   Glucose-Capillary 65 (*) 70 - 99 mg/dL   GLUCOSE, CAPILLARY     Status: Normal   Collection Time   04/08/12  6:38 AM      Component Value Range Comment   Glucose-Capillary 93  70 - 99 mg/dL   GLUCOSE, CAPILLARY     Status: Abnormal   Collection Time   04/08/12  7:48 AM      Component Value Range Comment   Glucose-Capillary 118 (*) 70 - 99 mg/dL   GLUCOSE, CAPILLARY     Status: Abnormal   Collection Time   04/08/12  11:26 AM      Component Value Range Comment   Glucose-Capillary 209 (*) 70 - 99 mg/dL   GLUCOSE, CAPILLARY     Status: Abnormal   Collection Time   04/08/12  4:21 PM      Component Value Range Comment   Glucose-Capillary 191 (*) 70 - 99 mg/dL     MICRO: mrsa colonized IMAGING: No results found.   Assessment/Plan:  66yo F with DM, PVD, dry cracked skin of lower extremities presents with 7 day history of worsening cellulitis, currently on vanco and piptazo, having good response. Likely strep or staph as cause of her cellulitis. It appears that patient has cephalosporin allergy thus cephalexin should be avoided. Bactrim alone would provide good MRSA coverage but insufficient for strep.  - recommend to finish out 10 day course of therapy with oral  antibiotics. - would discharge either on clindamycin 300mg  PO TID or linezolid 600mg  PO BID x 5 more days. The later has worked for the patient in the past but also more expensive than clindamycin, but less risk for cdiff.  Duke Salvia Drue Second MD MPH [facility] [phone]

## 2012-04-08 NOTE — Progress Notes (Signed)
Utilization review completed.  

## 2012-04-09 LAB — BASIC METABOLIC PANEL
Calcium: 9 mg/dL (ref 8.4–10.5)
Creatinine, Ser: 1.82 mg/dL — ABNORMAL HIGH (ref 0.50–1.10)
GFR calc Af Amer: 32 mL/min — ABNORMAL LOW (ref >90)
GFR calc non Af Amer: 28 mL/min — ABNORMAL LOW (ref >90)
Sodium: 136 mEq/L (ref 135–145)

## 2012-04-09 LAB — GLUCOSE, CAPILLARY
Glucose-Capillary: 63 mg/dL — ABNORMAL LOW (ref 70–99)
Glucose-Capillary: 219 mg/dL — ABNORMAL HIGH (ref 70–99)

## 2012-04-09 LAB — CBC
Platelets: 238 10*3/uL (ref 150–400)
RBC: 4.21 MIL/uL (ref 3.87–5.11)
RDW: 20.2 % — ABNORMAL HIGH (ref 11.5–15.5)
WBC: 4 10*3/uL (ref 4.0–10.5)

## 2012-04-09 MED ORDER — LINEZOLID 600 MG PO TABS
600.0000 mg | ORAL_TABLET | Freq: Two times a day (BID) | ORAL | Status: DC
  Administered 2012-04-09 – 2012-04-10 (×3): 600 mg via ORAL
  Filled 2012-04-09 (×4): qty 1

## 2012-04-09 MED ORDER — INSULIN GLARGINE 100 UNIT/ML ~~LOC~~ SOLN: 6.0000 [IU] | Freq: Every day | SUBCUTANEOUS | Status: DC

## 2012-04-09 MED ORDER — ALBUTEROL SULFATE (5 MG/ML) 0.5% IN NEBU
2.5000 mg | INHALATION_SOLUTION | Freq: Once | RESPIRATORY_TRACT | Status: AC
  Administered 2012-04-09: 2.5 mg via RESPIRATORY_TRACT
  Filled 2012-04-09: qty 0.5

## 2012-04-09 MED ORDER — FUROSEMIDE 20 MG PO TABS
20.0000 mg | ORAL_TABLET | Freq: Once | ORAL | Status: AC
  Administered 2012-04-09: 20 mg via ORAL
  Filled 2012-04-09: qty 1

## 2012-04-09 MED ORDER — DIPHENHYDRAMINE HCL 25 MG PO CAPS
25.0000 mg | ORAL_CAPSULE | Freq: Four times a day (QID) | ORAL | Status: DC | PRN
  Administered 2012-04-09 – 2012-04-10 (×3): 25 mg via ORAL
  Filled 2012-04-09 (×3): qty 1

## 2012-04-09 NOTE — Progress Notes (Signed)
Internal Medicine Attending  Date: 04/09/2012  Patient name: [name] Medical record number: [id number] Date of birth: 1945/11/07 Age: 66 y.o. Gender: female  I saw and evaluated the patient on a.m. rounds with house staff.  I agree with plans to switch to oral antibiotic regimen as per ID, and decrease Lantus insulin given relatively low blood sugar this morning.

## 2012-04-09 NOTE — Progress Notes (Signed)
Results for LINSI, HUMANN (MRN [id number]) as of 04/09/2012 09:40  Ref. Range 04/08/2012 06:16  Glucose-Capillary Latest Range: 70-99 mg/dL 65 (L)    Results for SANDER, SPECKMAN (MRN [id number]) as of 04/09/2012 09:40  Ref. Range 04/09/2012 06:01  Glucose-Capillary Latest Range: 70-99 mg/dL 63 (L)   Hypoglycemic in the morning the last two days.  Currently getting Lantus 22 units in the AM & Lantus 8 units in the PM.  May want to reduce the total dose by 10% to avoid further morning lows.  Recommend the following: Reduce AM Lantus dose to 20 units Reduce PM Lantus dose to 6 units  Will follow. Ambrose Finland RN, MSN, CDE Diabetes Coordinator [facility]patient Diabetes [facility] [phone]

## 2012-04-09 NOTE — Progress Notes (Signed)
Pt. Verbalized that she is not feeling good,she said she is having pressure on her chest and difficulty breathing.MD. Has been notified,he is on the way to see the pt.keep monitoring pt. Closely.

## 2012-04-10 LAB — GLUCOSE, CAPILLARY
Glucose-Capillary: 137 mg/dL — ABNORMAL HIGH (ref 70–99)
Glucose-Capillary: 96 mg/dL (ref 70–99)
Glucose-Capillary: 148 mg/dL — ABNORMAL HIGH (ref 70–99)

## 2012-04-10 MED ORDER — FUROSEMIDE 20 MG PO TABS: 20.0000 mg | ORAL_TABLET | Freq: Every day | ORAL | Status: DC

## 2012-04-10 MED ORDER — INSULIN GLARGINE 100 UNIT/ML ~~LOC~~ SOLN: 6.0000 [IU] | Freq: Every day | SUBCUTANEOUS | Status: DC

## 2012-04-10 MED ORDER — INSULIN GLARGINE 100 UNIT/ML ~~LOC~~ SOLN: 22.0000 [IU] | Freq: Every day | SUBCUTANEOUS | Status: DC

## 2012-04-10 MED ORDER — INSULIN GLARGINE 100 UNIT/ML ~~LOC~~ SOLN: 20.0000 [IU] | Freq: Every day | SUBCUTANEOUS | Status: DC

## 2012-04-10 MED ORDER — HYDROCODONE-ACETAMINOPHEN 5-325 MG PO TABS: 1.0000 | ORAL_TABLET | ORAL | Status: AC | PRN

## 2012-04-10 MED ORDER — FUROSEMIDE 20 MG PO TABS
20.0000 mg | ORAL_TABLET | Freq: Every day | ORAL | Status: DC
  Administered 2012-04-10: 20 mg via ORAL
  Filled 2012-04-10: qty 1

## 2012-04-10 MED ORDER — LINEZOLID 600 MG PO TABS: 600.0000 mg | ORAL_TABLET | Freq: Two times a day (BID) | ORAL | Status: DC

## 2012-04-10 MED ORDER — DIPHENHYDRAMINE HCL 25 MG PO CAPS: 25.0000 mg | ORAL_CAPSULE | Freq: Four times a day (QID) | ORAL | Status: DC | PRN

## 2012-04-10 NOTE — Progress Notes (Signed)
Subjective:   Patient reports improvement in her leg pain and overall feels OK. Denies any chest pain, abdominal pain, SOB. She is afebrile. She still has mild dry cough.   Objective: Vital signs in last 24 hours: Filed Vitals:   04/09/12 1700 04/09/12 1950 04/09/12 2210 04/10/12 0600  BP: 139/74  145/54 140/56  Pulse: 74  61 64  Temp: 98.2 F (36.8 C)  97.4 F (36.3 C) 98.3 F (36.8 C)  TempSrc: Oral  Oral Oral  Resp: 18  18 18   Height:      Weight:   176 lb (79.833 kg)   SpO2: 95% 97% 100% 100%   Weight change: 11 lb 1.5 oz (5.033 kg)  Intake/Output Summary (Last 24 hours) at 04/10/12 0906 Last data filed at 04/09/12 1300  Gross per 24 hour  Intake    600 ml  Output   1750 ml  Net  -1150 ml   Physical exam  General: resting in bed, not in acute distress  HEENT: PERRL, EOMI, no scleral icterus  Cardiac: S1/S2, 2/6 systolic murmur left sternal border; no S3, no S4, RRR, No murmurs, gallops or rubs  Pulm: Good air movement bilaterally, Clear to auscultation bilaterally, No rales, wheezing, rhonchi or rubs.  Abd: Soft, nondistended, nontender, no rebound pain, no organomegaly, BS present  Ext: There is erythema and tenderness of the right leg and heel, with improved tenderness over the lower half of the right lateral leg. There are no obvious skin breaks, although there is thickening and scaling of the skin on the patient's shin . 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  Neuro: alert and oriented X3,   Lab Results: Basic Metabolic Panel:  Lab 04/09/12 4098 04/08/12 0600  NA 136 136  K 3.9 3.8  CL 99 99  CO2 27 26  GLUCOSE 77 67*  BUN 39* 37*  CREATININE 1.82* 1.83*  CALCIUM 9.0 8.9  MG -- --  PHOS -- --   Liver Function Tests:  Lab 04/03/12 1616  AST 21  ALT 14  ALKPHOS 125*  BILITOT 0.6  PROT 7.1  ALBUMIN 3.1*    CBC:  Lab 04/09/12 0540 04/06/12 0835  04/03/12 1616  WBC 4.0 4.3 --  NEUTROABS -- -- 5.2  HGB 9.3* 9.2* --  HCT 30.6* 30.3* --  MCV 72.7* 72.8* --  PLT 238 252 --   CBG:  Lab 04/10/12 0754 04/10/12 0209 04/09/12 2327 04/09/12 2205 04/09/12 1711 04/09/12 1144  GLUCAP 96 137* 118* 75 153* 219*   Hemoglobin A1C:  Lab 04/05/12 0555  HGBA1C 10.2*    Thyroid Function Tests:  Lab 04/05/12 1212  TSH 1.052  T4TOTAL --  FREET4 --  T3FREE --  THYROIDAB --    Anemia Panel:  Lab 04/04/12 0610  VITAMINB12 180*  FOLATE >20.0  FERRITIN 16  TIBC 342  IRON 15*  RETICCTPCT 1.4    Micro Results: Recent Results (from the past 240 hour(s))  MRSA PCR SCREENING     Status: Abnormal   Collection Time   04/04/12  1:03 AM      Component Value Range Status Comment   MRSA by PCR POSITIVE (*) NEGATIVE Final    Studies/Results: No results found. Medications: I have reviewed the patient's current medications. Scheduled Meds:    . albuterol  2.5 mg Nebulization Once  . aspirin EC  325 mg Oral Daily  .  atorvastatin  40 mg Oral QHS  . brimonidine  1 drop Both Eyes BID  . Chlorhexidine Gluconate Cloth  6 each Topical Q0600  . docusate sodium  100 mg Oral BID  . enoxaparin  30 mg Subcutaneous Q24H  . ezetimibe  10 mg Oral Daily  . furosemide  20 mg Oral Once  . insulin aspart  0-5 Units Subcutaneous QHS  . insulin aspart  0-9 Units Subcutaneous TID WC  . insulin glargine  22 Units Subcutaneous Q breakfast  . insulin glargine  6 Units Subcutaneous QHS  . isosorbide mononitrate  30 mg Oral Daily  . latanoprost  1 drop Both Eyes QHS  . levothyroxine  125 mcg Oral Q0600  . linezolid  600 mg Oral Q12H  . metoprolol succinate  25 mg Oral Daily  . pantoprazole  80 mg Oral Q1200  . DISCONTD: insulin glargine  8 Units Subcutaneous QHS  . DISCONTD: piperacillin-tazobactam (ZOSYN)  IV  3.375 g Intravenous Q8H  . DISCONTD: vancomycin  750 mg Intravenous Q24H   Continuous Infusions:    . sodium chloride 50 mL/hr at 04/08/12  1149   PRN Meds:.acetaminophen, acetaminophen, benzonatate, diphenhydrAMINE, HYDROcodone-acetaminophen, hydrOXYzine, nitroGLYCERIN Assessment/Plan:  # Right LE cellulitis - Mildly improved (erythema present but decreased pain) : High risk given h/o PVD & uncontrolled DM. Skin is calloused with high risk of skin breaks. 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 DVT less likely as erythema is anterolateral with no posterior tenderness. MRI 6/21 showed Diffuse cellulitis but no findings for focal soft tissue abscess, osteomyelitis, septic arthritis or myofasciitis. Per Vascular - no intervention as of now. Continue antibiotic therapy and compression stocking. ID was consulted. Dr. Drue Second saw patient and suggested 10 day course of therapy with oral antitiotics.  - Appreciate vascular assistance with management  - Apprecite ID consultation in management of our patient. - will switch to oral Linezolid 600 mg PO bid    #Chronic Renal Failure stage 4 likely in the setting of uncontrolled DM : Patient at baseline Cr 1.2 to 1.7 in last year , GFR = 36. Creatinine up to 1.8  - hold lasix for today -Monitor BMP   #Type 1 Uncontrolled DM on Insulin: Patient reports compliance with home insulin but does not have good control, nor is she willing to make big lifestyle changes at present. Hgb A1c 10.2 -Continue Lantus from 22 units AM  - will decrease Lantus 6 units in the pm  -Continue SSI  #Chronic Microcytic Anemia: Patient's Hb is at baseline, but it does not appear to have been worked up in the past. her anemia panel: iron 15, TIBC 327, Ferritin 16, folate>20. These results indicate iron deficiency. At her age, iron deficiency is concerning for colon cancer. Vitamin B12 is low at 180.  -Methylmalonic acid pending   -Holding iron supplement due to patient's intolerance. Patient  does not recall the name of her home iron  medication. Patient wants to restart her own iron supplement pills after discharge -Consider colonoscope as outpatient.  -Pending FOBT   #h/o CAD: Stable without chest pain. Being managed medically, as intervention on LCx was not successful. She is not felt to be a good candidate for surgery d/t poor distal vessels. Most recent LDL = 42 on 11/13/11.  -Continue ASA, atorvastatin, metoprolol   #h/o Hypothyrodism:  TSH 1.05.  -Continue home dose levothyroxine   #  Chronic Systolic & Diastolic CHF: Not in acute exacerbation. Patient's last echo was on 11/14/11 revealing EF of 30-35% with Grade 2 diastolic dysfunction as well as AS. Patient is followed by [facility] cardiology (Dr. Riley Kill, [name]).  - Strict I/Os, Daily weights  - Continue  Imdur, Toprol  And holding lasix for today due to Cre 1.8   #VTE: Lovenox   # Dispo : Patient noted she would like to establish primary care at [facility].    LOS: 7 days   [name] 04/10/2012, 9:06 AM

## 2012-04-10 NOTE — Progress Notes (Signed)
Subjective:  Patient reports improvement in her leg pain and overall feels OK. She had SOB yesterday.   Denies any chest pain, palpitation or abdominal pain.  She is afebrile.   Objective: Vital signs in last 24 hours: Filed Vitals:   04/09/12 1700 04/09/12 1950 04/09/12 2210 04/10/12 0600  BP: 139/74  145/54 140/56  Pulse: 74  61 64  Temp: 98.2 F (36.8 C)  97.4 F (36.3 C) 98.3 F (36.8 C)  TempSrc: Oral  Oral Oral  Resp: 18  18 18   Height:      Weight:   176 lb (79.833 kg)   SpO2: 95% 97% 100% 100%   Weight change: 11 lb 1.5 oz (5.033 kg)  Intake/Output Summary (Last 24 hours) at 04/10/12 0915 Last data filed at 04/09/12 1300  Gross per 24 hour  Intake    240 ml  Output    900 ml  Net   -660 ml   Physical exam  General: resting in bed, not in acute distress  HEENT: PERRL, EOMI, no scleral icterus  Cardiac: S1/S2, 2/6 systolic murmur left sternal border; no S3, no S4, RRR, No murmurs, gallops or rubs  Pulm: Good air movement bilaterally, Clear to auscultation bilaterally, No rales, wheezing, rhonchi or rubs.  Abd: Soft, nondistended, nontender, no rebound pain, no organomegaly, BS present  Ext: There is erythema and tenderness of the right leg and heel, with improved tenderness over the lower half of the right lateral leg. Right leg swelling improved significantly. There are no obvious skin breaks, although there is thickening and scaling of the skin on the patient's shin . 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  Neuro: alert and oriented X3,   Lab Results: Basic Metabolic Panel:  Lab 04/09/12 1610 04/08/12 0600  NA 136 136  K 3.9 3.8  CL 99 99  CO2 27 26  GLUCOSE 77 67*  BUN 39* 37*  CREATININE 1.82* 1.83*  CALCIUM 9.0 8.9  MG -- --  PHOS -- --   Liver Function Tests:  Lab 04/03/12 1616  AST 21  ALT 14  ALKPHOS 125*  BILITOT 0.6  PROT 7.1  ALBUMIN 3.1*     CBC:  Lab 04/09/12 0540 04/06/12 0835 04/03/12 1616  WBC 4.0 4.3 --  NEUTROABS -- -- 5.2  HGB 9.3* 9.2* --  HCT 30.6* 30.3* --  MCV 72.7* 72.8* --  PLT 238 252 --   CBG:  Lab 04/10/12 0754 04/10/12 0209 04/09/12 2327 04/09/12 2205 04/09/12 1711 04/09/12 1144  GLUCAP 96 137* 118* 75 153* 219*   Hemoglobin A1C:  Lab 04/05/12 0555  HGBA1C 10.2*    Thyroid Function Tests:  Lab 04/05/12 1212  TSH 1.052  T4TOTAL --  FREET4 --  T3FREE --  THYROIDAB --    Anemia Panel:  Lab 04/04/12 0610  VITAMINB12 180*  FOLATE >20.0  FERRITIN 16  TIBC 342  IRON 15*  RETICCTPCT 1.4    Micro Results: Recent Results (from the past 240 hour(s))  MRSA PCR SCREENING     Status: Abnormal   Collection Time   04/04/12  1:03 AM      Component Value Range Status Comment   MRSA by PCR POSITIVE (*) NEGATIVE Final    Studies/Results: No results found. Medications: I have reviewed the patient's current medications. Scheduled Meds:    . albuterol  2.5 mg Nebulization Once  .  aspirin EC  325 mg Oral Daily  . atorvastatin  40 mg Oral QHS  . brimonidine  1 drop Both Eyes BID  . Chlorhexidine Gluconate Cloth  6 each Topical Q0600  . docusate sodium  100 mg Oral BID  . enoxaparin  30 mg Subcutaneous Q24H  . ezetimibe  10 mg Oral Daily  . furosemide  20 mg Oral Once  . insulin aspart  0-5 Units Subcutaneous QHS  . insulin aspart  0-9 Units Subcutaneous TID WC  . insulin glargine  22 Units Subcutaneous Q breakfast  . insulin glargine  6 Units Subcutaneous QHS  . isosorbide mononitrate  30 mg Oral Daily  . latanoprost  1 drop Both Eyes QHS  . levothyroxine  125 mcg Oral Q0600  . linezolid  600 mg Oral Q12H  . metoprolol succinate  25 mg Oral Daily  . pantoprazole  80 mg Oral Q1200  . DISCONTD: insulin glargine  8 Units Subcutaneous QHS  . DISCONTD: piperacillin-tazobactam (ZOSYN)  IV  3.375 g Intravenous Q8H  . DISCONTD: vancomycin  750 mg Intravenous Q24H   Continuous  Infusions:   PRN Meds:.acetaminophen, acetaminophen, benzonatate, diphenhydrAMINE, HYDROcodone-acetaminophen, hydrOXYzine, nitroGLYCERIN Assessment/Plan:  # Right LE cellulitis - Mildly improved (erythema present but decreased pain) : High risk given h/o PVD & uncontrolled DM. Skin is calloused with high risk of skin breaks. 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 DVT less likely as erythema is anterolateral with no posterior tenderness. MRI 6/21 showed Diffuse cellulitis but no findings for focal soft tissue abscess, osteomyelitis, septic arthritis or myofasciitis. Per Vascular - no intervention as of now. Continue antibiotic therapy and compression stocking. ID was consulted. Dr. Drue Second saw patient and suggested 10 day course of therapy with oral antitiotics.  - Appreciate vascular assistance with management  - Apprecite ID consultation in management of our patient. - Will continue oral Linezolid 600 mg PO bid    #Chronic Renal Failure stage 4 likely in the setting of uncontrolled DM : Patient at baseline Cr 1.2 to 1.7 in last year , GFR = 36. Creatinine up to 1.8  -will restart low dose of lasix due to SOB which is most likely secondary to fluid retention in the setting of CHF. -Monitor BMP   #Type 1 Uncontrolled DM on Insulin: Patient reports compliance with home insulin but does not have good control, nor is she willing to make big lifestyle changes at present. Hgb A1c 10.2 -Continue Lantus from 22 units AM  - Continue Lantus 6 units in the pm  -Continue SSI  #Chronic Microcytic Anemia: Patient's Hb is at baseline, but it does not appear to have been worked up in the past. her anemia panel: iron 15, TIBC 327, Ferritin 16, folate>20. These results indicate iron deficiency. At her age, iron deficiency is concerning for colon cancer. Vitamin B12 is low at 180.   -Methylmalonic acid pending   -Holding iron supplement due to patient's intolerance. Patient does not recall the name of her home iron  medication. Patient wants to restart her own iron supplement pills after discharge -Consider colonoscope as outpatient.  -Pending FOBT   #h/o CAD: Stable without chest pain. Being managed medically, as intervention on LCx was not successful. She is not felt to be a good candidate for surgery d/t poor distal vessels. Most recent LDL = 42 on 11/13/11.  -Continue ASA, atorvastatin,  metoprolol   #h/o Hypothyrodism:  TSH 1.05.  -Continue home dose levothyroxine   #Chronic Systolic & Diastolic CHF: Not in acute exacerbation. Patient's last echo was on 11/14/11 revealing EF of 30-35% with Grade 2 diastolic dysfunction as well as AS. Patient is followed by [facility] cardiology (Dr. Riley Kill, [name]).  - Strict I/Os, Daily weights  - Continue  Imdur, Toprol  - will continue lasix 20 mg daily.   #VTE: Lovenox   # Dispo : d/c home and follow up in clinic.   LOS: 7 days   [name] 04/10/2012, 9:15 AM

## 2012-04-10 NOTE — Plan of Care (Signed)
Problem: Food- and Nutrition-Related Knowledge Deficit (NB-1.1) Goal: Nutrition education Formal process to instruct or train a patient/client in a skill or to impart knowledge to help patients/clients voluntarily manage or modify food choices and eating behavior to maintain or improve health.  Outcome: Completed/Met Date Met:  04/10/12 Nutrition Education Note  RD drawn to chart secondary to current prescription of zyvox, a medication with potential interactions with high tyramine-containing foods.  RD provided "Tyramine-Restricted Nutrition Therapy" handout from the [employer] and discussed importance of this restriction while taking current medication. Reviewed list of foods to avoid.  Expect good compliance.  Body mass index is 31.18 kg/(m^2). Pt meets criteria for Obesity Class I based on current BMI.  Current diet order is CHO Modified Medium, patient is consuming approximately 100% of meals at this time. Additional labs and medications reviewed.   RD will change current diet to Low Tyramine Diet to prevent potential food-medication interaction.   No further nutrition interventions warranted at this time. If additional nutrition issues arise, please re-consult RD.  [name] Motto MS, RD, LDN Pager#: [phone]

## 2012-04-10 NOTE — Discharge Instructions (Signed)
1. You have hospital follow up appointments as follows:   [name] On 04/15/2012 2:15 PM on 04/15/12 [address] [place] [address] [phone]   2. Please take all medications as prescribed.  3. If you have worsening of your symptoms or new symptoms arise, please call the clinic ([phone]), or go to the ER immediately if symptoms are severe.   You have done great job in taking all your medications. I appreciate it very much. Please continue doing that.

## 2012-04-10 NOTE — Discharge Summary (Signed)
Patient Name:  [name]  MRN: [id number]  PCP: No primary provider on file.  DOB:  [birth year]       Date of Admission:  04/03/2012  Date of Discharge:  04/10/2012      Attending Physician: Dr. Bonnetta Barry att. providers found         DISCHARGE DIAGNOSES:  1. Cellulitis of right leg 2. Type 1 diabetes mellitus on insulin therapy 3. Hypertension 4. Renal insufficiency 5.  Microcytic anemia 6. CHF 7. Hypothyroidism 8. CAD   DISPOSITION AND FOLLOW-UP: [name] is to follow-up with the listed providers as detailed below, at patient's visiting, please address following issues:  1. Please check her BMP to assure her creatinine is trending down. At discharge, her creatinine was 1.82. Her baseline creatinine is 1.2 to 1.7.   2. Please check her CBG and make adjustment of her Lantus dosage. She was discharged on 20 units of Lantus in morning and 6 units of Lantus in evening which were lower than her home dose.  2. Please confirm that the patient is taking her iron supplement pills.  3. Please give GI referral for colonoscopy. She has iron deficiency anemia and did not have a colonoscopy for more than 10 years.  4. Please followup the result of methylmalonic acid test. Patient had low vitamin B12 in hospital. Methylmalonic acid test was ordered, but result was pending at the discharge.   Follow-up Information    Follow up with [name] Harp, MD on 04/15/2012. (2:15 PM on 04/15/12)    Contact information:   [address] [place] [address] [phone]         Discharge Orders    Future Appointments: Provider: Department: Dept Phone: Center:   04/15/2012 2:15 PM [name] Harp, MD [facility] [phone] [facility]   06/12/2012 10:30 AM [name] Riffle, MD [facility] [phone] [facility]   06/17/2012 2:00 PM [facility] [phone] [facility]   06/17/2012 2:30 PM [facility] [place] [phone] [facility]   06/17/2012 3:15 PM [name] Earthly, MD [facility] [phone] [facility]     Future Orders Please Complete By Expires   Diet - low sodium heart healthy      Diet Carb Modified      Increase activity slowly      Call MD for:  temperature >100.4      Call MD for:  difficulty breathing, headache or visual disturbances      Call MD for:  persistant dizziness or light-headedness      Call MD for:  extreme fatigue      Call MD for:  severe uncontrolled pain      Call MD for:  redness, tenderness, or signs of infection (pain, swelling, redness, odor or green/yellow discharge around incision site)          DISCHARGE MEDICATIONS: Medication List  As of 04/10/2012  3:34 PM   STOP taking these medications         hydrOXYzine 10 MG tablet         TAKE these medications         aspirin EC 325 MG tablet   Take 325 mg by mouth daily.      atorvastatin 40 MG tablet   Commonly known as: LIPITOR   Take 1 tablet (40 mg total) by mouth at bedtime.      benzonatate 100 MG capsule   Commonly known as: TESSALON  Take 100 mg by mouth 3 (three) times daily as needed. For cough      brimonidine 0.2 % ophthalmic solution   Commonly known as: ALPHAGAN   Place 1 drop into both eyes 2 (two) times daily.      COLACE PO   Take 1 capsule by mouth daily as needed. For constipation      diphenhydrAMINE 25 mg capsule   Commonly known as: BENADRYL   Take 1 capsule (25 mg total) by mouth every 6 (six) hours as needed for itching.      ezetimibe 10 MG tablet   Commonly known as: ZETIA   Take 1 tablet (10 mg total) by mouth daily.      furosemide 20 MG tablet   Commonly known as: LASIX   Take 1 tablet (20 mg total) by mouth daily.      HUMALOG PEN 100 UNIT/ML injection   Generic drug: insulin lispro   Inject 0-9 Units into the skin 4 (four) times daily. Per sliding scale DO NOT GIVE WITHOUT CONSULTING PATIENT ON HER SLIDING SCALE      HYDROcodone-acetaminophen 5-325 MG per tablet   Commonly known as: NORCO   Take 1-2 tablets  by mouth every 4 (four) hours as needed.      insulin glargine 100 UNIT/ML injection   Commonly known as: LANTUS   Inject 6 Units into the skin at bedtime.      insulin glargine 100 UNIT/ML injection   Commonly known as: LANTUS   Inject 20 Units into the skin daily with breakfast.      isosorbide mononitrate 30 MG 24 hr tablet   Commonly known as: IMDUR   Take 1 tablet (30 mg total) by mouth daily.      latanoprost 0.005 % ophthalmic solution   Commonly known as: XALATAN   Place 1 drop into both eyes at bedtime.      levothyroxine 125 MCG tablet   Commonly known as: SYNTHROID, LEVOTHROID   Take 125 mcg by mouth daily.      linezolid 600 MG tablet   Commonly known as: ZYVOX   Take 1 tablet (600 mg total) by mouth every 12 (twelve) hours.      metoprolol succinate 25 MG 24 hr tablet   Commonly known as: TOPROL-XL   Take 1 tablet (25 mg total) by mouth daily.      nitroGLYCERIN 0.4 MG SL tablet   Commonly known as: NITROSTAT   Place 0.4 mg under the tongue every 5 (five) minutes as needed. For chest pain      omeprazole 40 MG capsule   Commonly known as: PRILOSEC   Take 1 capsule (40 mg total) by mouth daily.             CONSULTS:  Treatment Team:  [name] Libman, MD    PROCEDURES PERFORMED:  Mr Tibia Fibula Right Wo Contrast  04/04/2012  *RADIOLOGY REPORT*  Clinical Data: Right leg pain and redness.  MRI OF LOWER RIGHT EXTREMITY WITHOUT CONTRAST  Technique:  Multiplanar, multisequence MR imaging of the lower right extremity was performed. No intravenous contrast was administered.  Comparison: None.  Findings: There is diffuse subcutaneous soft tissue swelling/edema and fluid involving the entire right lower extremity.  Similar but less significant findings in the left leg.  No discrete soft tissue abscess.  No findings for osteomyelitis or septic arthritis.  No findings for myositis or myofasciitis.  IMPRESSION: Diffuse cellulitis but no findings for focal soft  tissue abscess, osteomyelitis, septic arthritis or myofasciitis.  Original Report Authenticated By: P. Loralie Champagne, M.D.   Mr Foot Right Wo Contrast  04/05/2012  *RADIOLOGY REPORT*  Clinical Data: Pain, swelling and redness.  MRI OF THE RIGHT FOREFOOT WITHOUT CONTRAST  Technique:  Multiplanar, multisequence MR imaging was performed. No intravenous contrast was administered.  Comparison: Right lower extremity MRI 04/04/2012.  Findings: There is diffuse subcutaneous soft tissue swelling/edema and fluid suggesting cellulitis.  No focal fluid collection to suggest an abscess.  No findings for osteomyelitis or septic arthritis.  Edema like signal abnormality in the foot musculature suggesting myositis.  The medial and lateral ankle ligaments and tendons are intact.  The Achilles tendon and plantar fascia are intact.  Mild midfoot degenerative changes.  IMPRESSION: Findings suggest cellulitis and myositis but no focal soft tissue abscess, septic arthritis or osteomyelitis.  Original Report Authenticated By: P. Loralie Champagne, M.D.       ADMISSION DATA:  History of Present Illness: Patient is a 66 yo woman with h/o uncontrolled type 1 DM, PVD & osteomyelitis who presents with acute onset, progressively worsening RLE erythema since 1 week PTA.  She reports that the right lower leg erythema initiated over her right lateral ankle and has progressed upwards.  Described pain as burning, but has remained of similar intensity for the past week. Pain is accompanied by swelling, which is improved with leg elevation.  She tried some type of cream which she had from an episode of dermatitis a few months ago, which provides relief for 1 hour.  Pain is exacerbated by walking, but she is able to bear weight. Pain rated as 2-3/10 with walking, but 10/10 to touch.  She reports a temperature of 99.4 at home.  She reports pain is similar to when she experienced left foot cellulitis/osteomyelitis with wound culture growing  pseudomonas s/p transmetatarsal amputation.  At that time, her foot was gangrenous.    Physical Exam: Blood pressure 139/68, pulse 66, temperature 98.8 F (37.1 C), temperature source Oral, resp. rate 20, height 5' 3.5" (1.613 m), weight 165 lb (74.844 kg), SpO2 100.00%. 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, erythema of right lateral heel extending up through ankle and lateral lower leg (demarcated with bodypen), full ROM of right ankle, but painful on mvmt, no apparent bulla or exudates or open wound, nor is there any crepitus on exam, skin of b/l feet is diffusely calloused without ulcer Neuro: alert and oriented X3, cranial nerves II-XII grossly intact  Lab results: Basic Metabolic Panel:  Basename  04/03/12 1616   NA  136   K  4.6   CL  99   CO2  26   GLUCOSE  216*   BUN  28*   CREATININE  1.46*   CALCIUM  8.9   MG  --   PHOS  --    Liver Function Tests:  Basename  04/03/12 1616   AST  21   ALT  14   ALKPHOS  125*   BILITOT  0.6   PROT  7.1   ALBUMIN  3.1*    CBC:  Basename  04/03/12  1616   WBC  7.6   NEUTROABS  5.2   HGB  9.8*   HCT  32.0*   MCV  71.4*   PLT  304    CBG:  Basename  04/03/12 2034   GLUCAP  125*     HOSPITAL COURSE:  # Right LE cellulitis - Patient was admitted due to acute onset, progressively worsening RLE erythema . MRI 6/21 showed diffuse cellulitis but no findings for focal soft tissue abscess, osteomyelitis, septic arthritis or myofasciitis. Patient's  history of PVD and uncontrolled diabetes put her at high risk  for cellulitis. Vascular surgeon was consulted and suggested no intervention. Patient was initially treated with IV vancomycin and Zosyn. She responded to the initial treatment with improvement of leg pain and swelling. ID was consulted for transition to oral antibiotics. Dr. Drue Second saw patient and suggested 10 day course of therapy with antibiotics. Patient was switched to oral Linezolid 600 mg by mouth twice a day on 6/26. Her condition continued to improve. At the discharge, patient's leg pain and swelling improved significantly. Patient was discharged home on Linezolid for total of 5 days. She is to followup in clinic at 7/2.  #Chronic Renal Failure stage 4: It is likely due to uncontrolled DM.  Patient's baseline creatitine was 1.2 to 1.7 in last year and GFR was 36. Creatinine was up to 1.8 in this admission. It is likely due to overdiuresis. Her lasix was held initially and patient was also treated with gentle IVF (50 cc/h of normal saline). At discharge, her creatinine was 1.82. In her followup visit, she needs to have another BMP to assure her creatinine is trending down.  #Type 1 Uncontrolled DM on Insulin: Patient reports compliance with home insulin but did not have good control. Hgb A1c was 10.2. We initially continued her home dose of Lantus (22 units in the morning and 8 units in evening) and put her on sliding scale insulin. Patient's CBG was running low in the morning which was likely 2 to decreased oral intake. Then her Lantus was decreased to 6 units in evening. Her CBG was still running low.  At discharge, her Lantus dosage was decreased to 20 units in the morning and 6 units in evening. In her clinic followup visit, please check her CBG and then make adjustment of her Lantus dosage accordingly.  # Chronic Microcytic Anemia: Patient's Hb was at baseline, but it did not appear to have been worked up in the past. Her anemia panel: iron 15, TIBC 327, Ferritin 16, folate>20. These results  indicated iron deficiency. At her age, iron deficiency is concerning for colon cancer. Patient may need to have a colonoscopy as outpatient. Patient's vitamin B12 was also low at 180.  Methylmalonic acid test was pending before discharge. Patient was initially put on ferrous sulfate, but she did not tolerate this medication. It was discontinued on the second day. Patient said she had her own iron supplement pills. She will take her own iron supplement pills after going home.   # h/o CAD: it was stable without chest pain. It has been managed medically, as intervention on LCx was not successful. She was not felt to be a good candidate for surgery d/t poor distal vessels. Most recent LDL = 42 on 11/13/11.  We cntinued her home ASA, atorvastatin and metoprolol.   #h/o Hypothyrodism:  TSH 1.05.  Continued home dose levothyroxine   #Chronic Systolic & Diastolic CHF: She was not in acute CHF exacerbation. Patient's last  echo was on 11/14/11 revealing EF of 30-35% with Grade 2 diastolic dysfunction as well as AS. Patient is followed by [facility] cardiology (Dr. Riley Kill, [name]). Due to her increased creatinine, we initially held her Lasix for several days. She developed mild shortness of breath on 6/26. Her body weight increased by nearly 10 pounds after admission. Then we started low dose of Lasix, 20 mg daily. She responded to the low dose of Lasix with good urine output. Her shortness of breath also improved before discharge. She was discharged on Lasix 20 mg daily. She is to followup in clinic at 7/2. She is also to call her cardiologist for a followup appointment. Patient understood that her Lasix dose may need to be increased if her shortness of breath reoccurs, she agreed to call if she has any problems.  DISCHARGE DATA: Vital Signs: BP 129/75  Pulse 74  Temp 98.3 F (36.8 C) (Oral)  Resp 18  Ht 5\' 3"  (1.6 m)  Wt 176 lb (79.833 kg)  BMI 31.18 kg/m2  SpO2 100%  Labs: Results for orders placed during the  hospital encounter of 04/03/12 (from the past 24 hour(s))  GLUCOSE, CAPILLARY     Status: Abnormal   Collection Time   04/09/12  5:11 PM      Component Value Range   Glucose-Capillary 153 (*) 70 - 99 mg/dL  GLUCOSE, CAPILLARY     Status: Normal   Collection Time   04/09/12 10:05 PM      Component Value Range   Glucose-Capillary 75  70 - 99 mg/dL  GLUCOSE, CAPILLARY     Status: Abnormal   Collection Time   04/09/12 11:27 PM      Component Value Range   Glucose-Capillary 118 (*) 70 - 99 mg/dL  GLUCOSE, CAPILLARY     Status: Abnormal   Collection Time   04/10/12  2:09 AM      Component Value Range   Glucose-Capillary 137 (*) 70 - 99 mg/dL  GLUCOSE, CAPILLARY     Status: Normal   Collection Time   04/10/12  7:54 AM      Component Value Range   Glucose-Capillary 96  70 - 99 mg/dL   Comment 1 Notify RN     Comment 2 Documented in Chart    GLUCOSE, CAPILLARY     Status: Abnormal   Collection Time   04/10/12 12:16 PM      Component Value Range   Glucose-Capillary 148 (*) 70 - 99 mg/dL   Comment 1 Notify RN     Comment 2 Documented in Chart       Time Spent on Discharge: 35 min   Signed: [name], MD PGY I, Internal Medicine Resident 04/10/2012, 3:34 PM

## 2012-04-10 NOTE — Progress Notes (Signed)
Internal Medicine Attending  Date: 04/10/2012  Patient name: [name] Medical record number: [id number] Date of birth: 29-Jul-1946 Age: 66 y.o. Gender: female  I saw and evaluated the patient on a.m. rounds and discussed her care with resident Dr. Clyde Lundborg. I reviewed the resident's note by Dr. Clyde Lundborg and I agree with the resident's findings and plans as documented in his note.  Patient has no complaints this a.m.[name]  She reports that her right leg tenderness is further improved and she would like to go home.  She had an episode of shortness of breath yesterday evening, and was given an oral dose of Lasix with a 1.7 L response in urine output.  Today she has no shortness of breath and her lung exam is clear.  Her right leg erythema and tenderness is improving, although still present.  We discussed with her the option of staying in hospital one more day or going home today; she would very much like to go home today.  Given her brisk response to Lasix 20 mg, agree with plan to discharge her on a Lasix 20 mg daily dose but she understands that this may need to be titrated upward toward her previous home dose if she has any CHF symptoms.  She will weigh daily and will let us know if her weight is increasing or if she has symptoms of shortness of breath or edema.  Her blood sugars have run a little lower over the past 24 hours which may be partially the effect of linezolid, which can lower blood sugar; we discussed this with her, and she is in agreement with reducing her a.m. Lantus to 20 units with a p.m. Lantus dose of 6 units as recommended by diabetes coordinator.  She is experienced in adjusting her regimen at home and feels comfortable that she can respond appropriately based upon her blood sugar measurements.  We discussed the cost of linezolid, and she indicated that she could afford it . I agree with plan to discharge home today for completion of her course of oral linezolid with close outpatient followup  next week in clinic.

## 2012-04-11 LAB — METHYLMALONIC ACID, SERUM: Methylmalonic Acid, Quantitative: 7.7 umol/L — ABNORMAL HIGH (ref <0.40)

## 2012-04-15 ENCOUNTER — Ambulatory Visit (INDEPENDENT_AMBULATORY_CARE_PROVIDER_SITE_OTHER): Payer: Medicare Other | Admitting: Internal Medicine

## 2012-04-15 ENCOUNTER — Encounter: Payer: Self-pay | Admitting: Internal Medicine

## 2012-04-15 VITALS — BP 107/58 | HR 79 | Temp 97.2°F | Ht 63.0 in | Wt 173.0 lb

## 2012-04-15 DIAGNOSIS — L0291 Cutaneous abscess, unspecified: Secondary | ICD-10-CM | POA: Diagnosis not present

## 2012-04-15 DIAGNOSIS — D518 Other vitamin B12 deficiency anemias: Secondary | ICD-10-CM | POA: Insufficient documentation

## 2012-04-15 DIAGNOSIS — D509 Iron deficiency anemia, unspecified: Secondary | ICD-10-CM | POA: Diagnosis not present

## 2012-04-15 DIAGNOSIS — I509 Heart failure, unspecified: Secondary | ICD-10-CM | POA: Diagnosis not present

## 2012-04-15 DIAGNOSIS — L0390 Cellulitis, unspecified: Secondary | ICD-10-CM

## 2012-04-15 LAB — BASIC METABOLIC PANEL WITH GFR
BUN: 26 mg/dL — ABNORMAL HIGH (ref 6–23)
Calcium: 8.7 mg/dL (ref 8.4–10.5)
GFR, Est African American: 38 mL/min — ABNORMAL LOW
GFR, Est Non African American: 33 mL/min — ABNORMAL LOW
Glucose, Bld: 175 mg/dL — ABNORMAL HIGH (ref 70–99)

## 2012-04-15 MED ORDER — LINEZOLID 600 MG PO TABS: 600.0000 mg | ORAL_TABLET | Freq: Two times a day (BID) | ORAL | Status: AC

## 2012-04-15 NOTE — Progress Notes (Signed)
Patient ID: [name], female   DOB: 1946/06/21, 66 y.o.   MRN: [id number]  Subjective:   Patient ID: [name] female   DOB: 01-18-1946 66 y.o.   MRN: [id number]  HPI: [name] is a 66 y.o. as outlined below, who presents with a hospital followup visit today.  Patient was recently hospitalized from 6/22 to 6/27 because of right leg cellulitis. Patient had a MRI which showed diffuse cellulitis, but no findings for focal soft tissue abscess, osteomyelitis, septic arthritis or myofasciitis. Patient was initially treated with IV vancomycin and Zosyn in  hospital which were switched to oral linezolid for 5 days at discharge (total of 10 days of antibiotics). Today, patient's leg pain and swelling improved significantly. But she still has redness over the right leg. She feels like her condition has not completely been back to her baseline. She does not have a fever or chills.  Regarding her diabetes, she reports that she still has poor appetite after going home. She is currently taking 20 units of Lantus in the morning. She measures her CBG 4 times a day. Depending on the CBG results, she adjusts her evening Lantus dose accordingly. Sometimes she skip her evening Lantus dose if her CBG is running low.   Regarding her congestive heart failure, she is taking 20 mg of Lasix daily. She has mild shortness of breath which is at her baseline. She does not have significant fluid retention according to her experience.  Regarding her anemia, she was found to have iron deficiency anemia. At the discharge patient was instructed to start her home iron supplement pills. But she forgot to start taking it. She promised to start taking iron pills from tomorrow. Her vitamin B12 level was also low in hospital. Methylmalonic acid level was high, indicating patient may have vitamin B12 deficiency.   Past Medical History  Diagnosis Date  . HTN (hypertension)   . Hypothyroidism   . Diabetes  mellitus, type 2     insulin dependent  . History of recurrent UTIs   . CAD (coronary artery disease)     s/p CABG x3 in 1993; last cath in 2010  . Scleroderma   . Bilateral carpal tunnel syndrome 1970s  . 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 Followed by [name]  . Arthritis   . Anemia   . Claudication   . Aortic stenosis     0.8cm2 on echo in 10/2011  . Hyperlipidemia     on Lipitor  . Myocardial infarction 1986  . Seizure     ? seizure like event in 2010; workup included stress testing which led to repeat cath.   . Cat bite 2009    with MRSA; required I & D  . Heart murmur   . Shortness of breath   . Cancer     breast cancer  . Blood transfusion     no reaction from transfusion  . GERD (gastroesophageal reflux disease)    Current Outpatient Prescriptions  Medication Sig Dispense Refill  . aspirin EC 325 MG EC tablet Take 325 mg by mouth daily.        [name] atorvastatin (LIPITOR) 40 MG tablet Take 1 tablet (40 mg total) by mouth at bedtime.  30 tablet  11  . benzonatate (TESSALON) 100 MG capsule Take 100 mg by mouth 3 (three) times daily as needed. For cough      .  brimonidine (ALPHAGAN) 0.2 % ophthalmic solution Place 1 drop into both eyes 2 (two) times daily.        . diphenhydrAMINE (BENADRYL) 25 mg capsule Take 1 capsule (25 mg total) by mouth every 6 (six) hours as needed for itching.  60 capsule  2  . Docusate Sodium (COLACE PO) Take 1 capsule by mouth daily as needed. For constipation      . ezetimibe (ZETIA) 10 MG tablet Take 1 tablet (10 mg total) by mouth daily.  30 tablet  6  . furosemide (LASIX) 20 MG tablet Take 1 tablet (20 mg total) by mouth daily.  30 tablet  3  . HYDROcodone-acetaminophen (NORCO) 5-325 MG per tablet Take 1-2 tablets by mouth every 4 (four) hours as needed.  20 tablet  0  . insulin glargine (LANTUS) 100 UNIT/ML injection Inject 6 Units into the skin at bedtime.  10 mL  3  .  insulin glargine (LANTUS) 100 UNIT/ML injection Inject 20 Units into the skin daily with breakfast.  10 mL  5  . insulin lispro (HUMALOG PEN) 100 UNIT/ML injection Inject 0-9 Units into the skin 4 (four) times daily. Per sliding scale DO NOT GIVE WITHOUT CONSULTING PATIENT ON HER SLIDING SCALE      . isosorbide mononitrate (IMDUR) 30 MG 24 hr tablet Take 1 tablet (30 mg total) by mouth daily.  30 tablet  11  . latanoprost (XALATAN) 0.005 % ophthalmic solution Place 1 drop into both eyes at bedtime.        [name] levothyroxine (SYNTHROID, LEVOTHROID) 125 MCG tablet Take 125 mcg by mouth daily.        [name] linezolid (ZYVOX) 600 MG tablet Take 1 tablet (600 mg total) by mouth every 12 (twelve) hours.  10 tablet  0  . metoprolol succinate (TOPROL-XL) 25 MG 24 hr tablet Take 1 tablet (25 mg total) by mouth daily.  30 tablet  6  . nitroGLYCERIN (NITROSTAT) 0.4 MG SL tablet Place 0.4 mg under the tongue every 5 (five) minutes as needed. For chest pain      . omeprazole (PRILOSEC) 40 MG capsule Take 1 capsule (40 mg total) by mouth daily.  30 capsule  6  . DISCONTD: nitroGLYCERIN (NITROSTAT) 0.4 MG SL tablet Place 1 tablet (0.4 mg total) under the tongue every 5 (five) minutes as needed for chest pain.  25 tablet  3   Family History  Problem Relation Age of Onset  . Diabetes Mother    History   Social History  . Marital Status: Single    Spouse Name: N/A    Number of Children: N/A  . Years of Education: N/A   Occupational History  . Environmental health practitioner    Social History Main Topics  . Smoking status: Never Smoker   . Smokeless tobacco: Never Used  . Alcohol Use: No  . Drug Use: No  . Sexually Active: Not Currently    Birth Control/ Protection: Post-menopausal   Other Topics Concern  . Not on file   Social History Narrative  . No narrative on file   Review of Systems: Constitutional: Denies fever, chills, diaphoresis, appetite change and fatigue.  HEENT: Denies photophobia, eye pain,  redness, hearing loss, ear pain, congestion, sore throat, rhinorrhea, sneezing, mouth sores, trouble swallowing, neck pain, neck stiffness and tinnitus.   Respiratory: Denies SOB, DOE, cough, chest tightness,  and wheezing.   Cardiovascular: Denies chest pain, palpitations and leg swelling.  Gastrointestinal: Denies nausea, vomiting, abdominal pain, diarrhea, constipation,  blood in stool and abdominal distention.  Genitourinary: Denies dysuria, urgency, frequency, hematuria, flank pain and difficulty urinating.  Musculoskeletal: Denies myalgias, back pain, joint swelling, arthralgias and gait problem.  Skin: Denies pallor, rash and wound.  Neurological: Denies dizziness, seizures, syncope, weakness, light-headedness, numbness and headaches.  Hematological: Denies adenopathy. Easy bruising, personal or family bleeding history  Psychiatric/Behavioral: Denies suicidal ideation, mood changes, confusion, nervousness, sleep disturbance and agitation  Objective:  Physical Exam: Filed Vitals:   04/15/12 1528  BP: 107/58  Pulse: 79  Temp: 97.2 F (36.2 C)  TempSrc: Oral  Height: 5\' 3"  (1.6 m)  Weight: 173 lb (78.472 kg)  SpO2: 100%   Physical exam   General: resting in bed, not in acute distress  HEENT: PERRL, EOMI, no scleral icterus  Cardiac: S1/S2, 2/6 systolic murmur left sternal border; no S3, no S4, RRR, No murmurs, gallops or rubs  Pulm: Good air movement bilaterally, Clear to auscultation bilaterally, No rales, wheezing, rhonchi or rubs.  Abd: Soft, nondistended, nontender, no rebound pain, no organomegaly, BS present  Ext: There is mild swelling and tenderness over right leg, which improved significantly. She still has redness over her right leg which improved mildly. There is thickening and scaling of the skin on the patient's right shin . 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  Neuro:  alert and oriented X3,    Assessment & Plan:   #: Cellulitis: Patient's condition improved significantly compared with that in hospital. Her tenderness and swelling in right leg improved significantly, but she still has redness over the right lower leg, which has not been back to her baseline per patient's experience. Patient has completed 10 days of antibiotic course except for one last Linezolid pill for tonight. Given that the patient still has some rednessand some tenderness and swelling in her right leg , will continue the Linezolid for another 5 days.  #: CHF: Patient seems not to have acute exacerbation of CHF. Her lung auscultation is clear. Her shortness of breath is near her baseline. She is currently taking Lasix 20 mg daily. Will check  BMP to assure potassium and kidney function are okay.  #. Anemia: Patient has combination of iron deficiency anemia and vitamin B12 deficiency anemia. Patient agreed to start taking iron supplement. She would like to explore more and wants to get a second opinion from her other physicians before starting vitamin B12 injection.  #: Health maintenance: Because of her iron deficiency anemia, colonoscopy is important test to rule out any possibility of colon polyps or colon cancers. I discussed with her about this important issue. She would like to talk to her gastroenterologist for colonoscopy.  [name], MD PGY1, Internal Medicine Teaching Service Pager: [phone]

## 2012-04-15 NOTE — Patient Instructions (Signed)
1. You have done great job in taking all your medications. I appreciate it very much. Please continue doing that. 2. Please take all medications as prescribed.  3. If you have worsening of your symptoms or new symptoms arise, please call the clinic ([phone]), or go to the ER immediately if symptoms are severe.     

## 2012-04-24 ENCOUNTER — Encounter: Payer: Self-pay | Admitting: Internal Medicine

## 2012-04-24 ENCOUNTER — Ambulatory Visit (INDEPENDENT_AMBULATORY_CARE_PROVIDER_SITE_OTHER): Payer: Medicare Other | Admitting: Internal Medicine

## 2012-04-24 VITALS — BP 128/56 | HR 98 | Temp 97.8°F | Ht 63.0 in | Wt 167.1 lb

## 2012-04-24 DIAGNOSIS — D518 Other vitamin B12 deficiency anemias: Secondary | ICD-10-CM

## 2012-04-24 DIAGNOSIS — I5033 Acute on chronic diastolic (congestive) heart failure: Secondary | ICD-10-CM | POA: Diagnosis not present

## 2012-04-24 DIAGNOSIS — I509 Heart failure, unspecified: Secondary | ICD-10-CM

## 2012-04-24 DIAGNOSIS — I5042 Chronic combined systolic (congestive) and diastolic (congestive) heart failure: Secondary | ICD-10-CM | POA: Insufficient documentation

## 2012-04-24 DIAGNOSIS — D509 Iron deficiency anemia, unspecified: Secondary | ICD-10-CM | POA: Diagnosis not present

## 2012-04-24 DIAGNOSIS — L03115 Cellulitis of right lower limb: Secondary | ICD-10-CM

## 2012-04-24 DIAGNOSIS — L02419 Cutaneous abscess of limb, unspecified: Secondary | ICD-10-CM

## 2012-04-24 DIAGNOSIS — Z0000 Encounter for general adult medical examination without abnormal findings: Secondary | ICD-10-CM | POA: Insufficient documentation

## 2012-04-24 NOTE — Assessment & Plan Note (Signed)
Patient does have acute exacerbation of CHF. Her lung auscultation is clear. Her shortness of breath is near her baseline. No leg edema.  She is currently taking Lasix 20 mg daily. Will continue the current regimen.  

## 2012-04-24 NOTE — Assessment & Plan Note (Signed)
Patient does have acute exacerbation of CHF. Her lung auscultation is clear. Her shortness of breath is near her baseline. No leg edema.  She is currently taking Lasix 20 mg daily. Will continue the current regimen.

## 2012-04-24 NOTE — Assessment & Plan Note (Signed)
Anemia: Patient has combination of iron deficiency anemia and vitamin B12 deficiency anemia. Patient agreed to start taking iron supplement. She would like to explore more and wants to get a second opinion from her other physicians before starting vitamin B12 injection.

## 2012-04-24 NOTE — Progress Notes (Signed)
Patient ID: [name], female   DOB: [birth year], 66 y.o.   MRN: [id number]  Subjective:   Patient ID: [name] female   DOB: 1945-12-18 66 y.o.   MRN: [id number]  HPI:  [name] is a 66 y.o. as outlined below, who presents with a hospital followup visit today.  Patient was recently hospitalized from 6/22 to 6/27 because of right leg cellulitis. Patient had a MRI which showed diffuse cellulitis, but no findings for focal soft tissue abscess, osteomyelitis, septic arthritis or myofasciitis. Patient was initially treated with IV vancomycin and Zosyn in  hospital which were switched to oral linezolid for 5 days at discharge (total of 10 days of antibiotics). In her previous visit on 04/15/12, she still had redness and pain over the right leg and she did not feel like her condition had completely been back to her baseline. So we extended her Linezolid treatment by another five days. Today, she reports that she completed her antibiotic course and feels that she recoverd completely. She has minimal pain over her right leg. There is no swelling any more.  Regarding her diabetes, she is currently taking 20 units of Lantus in the morning. She measures her CBG 4 times a day. Depending on the CBG results, she adjusts her evening Lantus dose accordingly.   Regarding her congestive heart failure, she is taking 20 mg of Lasix daily. She has mild shortness of breath which is at her baseline. She does not have leg edema today.   Past Medical History  Diagnosis Date  . HTN (hypertension)   . Hypothyroidism   . Diabetes mellitus, type 2     insulin dependent  . History of recurrent UTIs   . CAD (coronary artery disease)     s/p CABG x3 in 1993; last cath in 2010  . Scleroderma   . Bilateral carpal tunnel syndrome 1970s  . 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 Followed by [name]  . Arthritis   . Anemia   .  Claudication   . Aortic stenosis     0.8cm2 on echo in 10/2011  . Hyperlipidemia     on Lipitor  . Myocardial infarction 1986  . Seizure     ? seizure like event in 2010; workup included stress testing which led to repeat cath.   . Cat bite 2009    with MRSA; required I & D  . Heart murmur   . Shortness of breath   . Cancer     breast cancer  . Blood transfusion     no reaction from transfusion  . GERD (gastroesophageal reflux disease)    Current Outpatient Prescriptions  Medication Sig Dispense Refill  . aspirin EC 325 MG EC tablet Take 325 mg by mouth daily.        [name] atorvastatin (LIPITOR) 40 MG tablet Take 1 tablet (40 mg total) by mouth at bedtime.  30 tablet  11  . benzonatate (TESSALON) 100 MG capsule Take 100 mg by mouth 3 (three) times daily as needed. For cough      . brimonidine (ALPHAGAN) 0.2 % ophthalmic solution Place 1 drop into both eyes 2 (two) times daily.        . diphenhydrAMINE (BENADRYL) 25 mg capsule Take 1 capsule (25 mg total) by mouth every 6 (six) hours as needed for itching.  60 capsule  2  . Docusate Sodium (  COLACE PO) Take 1 capsule by mouth daily as needed. For constipation      . ezetimibe (ZETIA) 10 MG tablet Take 1 tablet (10 mg total) by mouth daily.  30 tablet  6  . furosemide (LASIX) 20 MG tablet Take 1 tablet (20 mg total) by mouth daily.  30 tablet  3  . insulin glargine (LANTUS) 100 UNIT/ML injection Inject 6 Units into the skin at bedtime.  10 mL  3  . insulin glargine (LANTUS) 100 UNIT/ML injection Inject 20 Units into the skin daily with breakfast.  10 mL  5  . insulin lispro (HUMALOG PEN) 100 UNIT/ML injection Inject 0-9 Units into the skin 4 (four) times daily. Per sliding scale DO NOT GIVE WITHOUT CONSULTING PATIENT ON HER SLIDING SCALE      . isosorbide mononitrate (IMDUR) 30 MG 24 hr tablet Take 1 tablet (30 mg total) by mouth daily.  30 tablet  11  . latanoprost (XALATAN) 0.005 % ophthalmic solution Place 1 drop into both eyes at bedtime.         [name] levothyroxine (SYNTHROID, LEVOTHROID) 125 MCG tablet Take 125 mcg by mouth daily.        [name] linezolid (ZYVOX) 600 MG tablet Take 1 tablet (600 mg total) by mouth every 12 (twelve) hours.  10 tablet  0  . metoprolol succinate (TOPROL-XL) 25 MG 24 hr tablet Take 1 tablet (25 mg total) by mouth daily.  30 tablet  6  . nitroGLYCERIN (NITROSTAT) 0.4 MG SL tablet Place 0.4 mg under the tongue every 5 (five) minutes as needed. For chest pain      . omeprazole (PRILOSEC) 40 MG capsule Take 1 capsule (40 mg total) by mouth daily.  30 capsule  6  . DISCONTD: nitroGLYCERIN (NITROSTAT) 0.4 MG SL tablet Place 1 tablet (0.4 mg total) under the tongue every 5 (five) minutes as needed for chest pain.  25 tablet  3   Family History  Problem Relation Age of Onset  . Diabetes Mother    History   Social History  . Marital Status: Single    Spouse Name: N/A    Number of Children: N/A  . Years of Education: N/A   Occupational History  . Environmental health practitioner    Social History Main Topics  . Smoking status: Never Smoker   . Smokeless tobacco: Never Used  . Alcohol Use: No  . Drug Use: No  . Sexually Active: Not Currently    Birth Control/ Protection: Post-menopausal   Other Topics Concern  . None   Social History Narrative  . None   Review of Systems:  General: no fevers, chills, no changes in body weight, no changes in appetite Skin: no rash HEENT: no blurry vision, hearing changes or sore throat Pulm: no dyspnea, coughing, wheezing CV: no chest pain, palpitations, shortness of breath Abd: no nausea/vomiting, abdominal pain, diarrhea/constipation GU: no dysuria, hematuria, polyuria Ext: mild pain in right leg Neuro: no weakness, numbness, or tingling    Objective:  Physical Exam: Filed Vitals:   04/24/12 1043  BP: 128/56  Pulse: 98  Temp: 97.8 F (36.6 C)  TempSrc: Oral  Height: 5\' 3"  (1.6 m)  Weight: 167 lb 1.6 oz (75.796 kg)  SpO2: 100%   General: resting in  bed, not in acute distress   HEENT: PERRL, EOMI, no scleral icterus   Cardiac: S1/S2, 2/6 systolic murmur left sternal border; no S3, no S4, RRR, No murmurs, gallops or rubs   Pulm: Good  air movement bilaterally, Clear to auscultation bilaterally, No rales, wheezing, rhonchi or rubs.   Abd: Soft, nondistended, nontender, no rebound pain, no organomegaly, BS present   Ext: There is mild tenderness, but no swelling over right leg. Left leg has no swelling. Neuro: alert and oriented X3,    Assessment & Plan:

## 2012-04-24 NOTE — Assessment & Plan Note (Addendum)
  Because of her iron deficiency anemia, colonoscopy is important test to rule out any possibility of colon polyps or colon cancers. I discussed with her about this important issue. She would like to talk to her gastroenterologist for colonoscopy.

## 2012-04-24 NOTE — Patient Instructions (Signed)
1. You have done great job in taking all your medications. I appreciate it very much. Please continue doing that. 2. Please take all medications as prescribed.  3. If you have worsening of your symptoms or new symptoms arise, please call the clinic ([phone]), or go to the ER immediately if symptoms are severe.     

## 2012-04-24 NOTE — Assessment & Plan Note (Signed)
Cellulitis: It has resolved. There is minimal tenderness over right leg at lateral site. No signs of active infection. No further treatment needed.

## 2012-04-24 NOTE — Assessment & Plan Note (Signed)
Patient has combination of iron deficiency anemia and vitamin B12 deficiency anemia. Patient agreed to start taking iron supplement. She would like to explore more and wants to get a second opinion from her other physicians before starting vitamin B12 injection.

## 2012-05-09 ENCOUNTER — Telehealth: Payer: Self-pay | Admitting: Internal Medicine

## 2012-05-09 NOTE — Telephone Encounter (Signed)
Please return call to patient at [phone]  Patient said cardiac rehab instructed her to speak with nurse to convey the urgency of this appnt.  Please return call to patient

## 2012-05-09 NOTE — Telephone Encounter (Signed)
Pt was in hospital for leg cellulitis and is still feeling over all not normal, when in hospital she was given to much fluid and had CHF symptoms, ek 30-35 % c/o abd bloat, no appetite, wt fluctuating due to loss of appetite, chronic leg edema no increase. Pt cant return to cardiac rehab because she feels to bad, c/o slight sob not heard during conversation. Spoke with DOD DR [place], increase lasix from 20 mg to 40 mg daily till seen by Norma Fredrickson NP 05/13/12, last labs k+ 4.7, bun 26, creat 1.6  on 04/16/12.

## 2012-05-13 ENCOUNTER — Telehealth: Payer: Self-pay | Admitting: *Deleted

## 2012-05-13 ENCOUNTER — Encounter: Payer: Self-pay | Admitting: Nurse Practitioner

## 2012-05-13 ENCOUNTER — Ambulatory Visit (INDEPENDENT_AMBULATORY_CARE_PROVIDER_SITE_OTHER): Payer: Medicare Other | Admitting: Nurse Practitioner

## 2012-05-13 VITALS — BP 140/56 | HR 70 | Ht 63.5 in | Wt 171.0 lb

## 2012-05-13 DIAGNOSIS — L02419 Cutaneous abscess of limb, unspecified: Secondary | ICD-10-CM | POA: Diagnosis not present

## 2012-05-13 DIAGNOSIS — L03119 Cellulitis of unspecified part of limb: Secondary | ICD-10-CM

## 2012-05-13 DIAGNOSIS — D519 Vitamin B12 deficiency anemia, unspecified: Secondary | ICD-10-CM

## 2012-05-13 DIAGNOSIS — L03115 Cellulitis of right lower limb: Secondary | ICD-10-CM

## 2012-05-13 DIAGNOSIS — D509 Iron deficiency anemia, unspecified: Secondary | ICD-10-CM

## 2012-05-13 DIAGNOSIS — I2510 Atherosclerotic heart disease of native coronary artery without angina pectoris: Secondary | ICD-10-CM

## 2012-05-13 DIAGNOSIS — I5020 Unspecified systolic (congestive) heart failure: Secondary | ICD-10-CM

## 2012-05-13 DIAGNOSIS — N289 Disorder of kidney and ureter, unspecified: Secondary | ICD-10-CM

## 2012-05-13 DIAGNOSIS — D518 Other vitamin B12 deficiency anemias: Secondary | ICD-10-CM | POA: Diagnosis not present

## 2012-05-13 LAB — CBC
HCT: 30.7 % — ABNORMAL LOW (ref 36.0–46.0)
Hemoglobin: 9.4 g/dL — ABNORMAL LOW (ref 12.0–15.0)
MCHC: 30.8 g/dL (ref 30.0–36.0)
MCV: 73.3 fl — ABNORMAL LOW (ref 78.0–100.0)
Platelets: 403 10*3/uL — ABNORMAL HIGH (ref 150.0–400.0)
RBC: 4.19 Mil/uL (ref 3.87–5.11)
RDW: 24 % — ABNORMAL HIGH (ref 11.5–14.6)
WBC: 5.6 10*3/uL (ref 4.5–10.5)

## 2012-05-13 LAB — BASIC METABOLIC PANEL
BUN: 27 mg/dL — ABNORMAL HIGH (ref 6–23)
CO2: 26 mEq/L (ref 19–32)
Calcium: 8.8 mg/dL (ref 8.4–10.5)
Chloride: 105 mEq/L (ref 96–112)
Creatinine, Ser: 1.5 mg/dL — ABNORMAL HIGH (ref 0.4–1.2)
GFR: 37.4 mL/min — ABNORMAL LOW (ref >60.00)
Glucose, Bld: 260 mg/dL — ABNORMAL HIGH (ref 70–99)
Potassium: 4.1 mEq/L (ref 3.5–5.1)
Sodium: 139 mEq/L (ref 135–145)

## 2012-05-13 NOTE — Assessment & Plan Note (Signed)
This seems to be resolved. Off of antibiotics.

## 2012-05-13 NOTE — Assessment & Plan Note (Signed)
Her situation is quite tenuous. She is not a candidate for redo CABG. She has decreased LV function as well. Her overall prognosis looks poor to me. I do not think cardiac rehab is a feasible option at this time. I explained that we need to focus on the parts of her that we can have a positive impact (i.e. Anemia). We are rechecking labs today. I approached the subject of CHF referral. She is very hesitant to meet new doctors. May try to restart ARB. Her labs will be the limiting factor. We will see her back in one month. Patient is agreeable to this plan and will call if any problems develop in the interim.

## 2012-05-13 NOTE — Telephone Encounter (Signed)
Called patient regarding labs.  LMOM to call back.  Vista Mink, CMA

## 2012-05-13 NOTE — Assessment & Plan Note (Signed)
She is going to restart her oral iron therapy that she has been on in the past. She is agreeable to getting an appointment with hematology. She could be a candidate for Aranesp and B12 replacement. I think we could get her feeling a little better with some correction of the anemia.

## 2012-05-13 NOTE — Patient Instructions (Addendum)
Restart your slow iron  Stay on your current medicines including the higher dose of Lasix for now  See [name] next month  We are checking labs today  We will refer you to Hematology for your anemia  Continue to watch your salt and weigh daily  Call the [facility] office at [phone] if you have any questions, problems or concerns.

## 2012-05-13 NOTE — Assessment & Plan Note (Signed)
Says she is not ready at this time to start replacement. I think this would help her situation. We are referring to hematology.

## 2012-05-13 NOTE — Telephone Encounter (Signed)
Message copied by Awilda Bill on Tue May 13, 2012  2:56 PM ------      Message from: Rosalio Macadamia      Created: Tue May 13, 2012 12:59 PM       Ok to report. She is restarting her oral iron therapy. Will proceed on with the referral to hematology. Renal function continues to improve. Would consider ARB therapy on return. She is to see [name] next month.

## 2012-05-13 NOTE — Assessment & Plan Note (Signed)
No longer on ACE. Rechecking labs today.

## 2012-05-13 NOTE — Progress Notes (Signed)
[name] Date of Birth: 01/09/1946 Medical Record #[id number]  History of Present Illness: [name] is seen today for a work in visit. She is seen for [name]. She has multiple medical issues which include a history of CAD (s/p CABG- cath in 09/2011 showed patent LIMA to LAD with occlusion of distal LAD; High grade stenosis of nondominant RCA; Signif disease of prox LCx; SVG to L PDA occluded, SVG to OM patent), Aortic stenosis, DM, dyslipidemia, POVD, s/p revascularization - followed by [name], CKD. The patient underwent attempt of PTCA prox LCx by [name]. This was unsussessful and the procedure was aborted. The patient was evaluated by [name]. He thought she would be a very poor candidate for surgical revascularization. Med Rx continued. Echo 10/2011: Mild LVH, EF 30%, apex akinetic, septal HK, grade 2 diastolic dysfunction, or functionally bicuspid aortic valve, mild aortic stenosis by gradient, severe by calculated AVA-suspect A. Aortic stenosis probably moderate, trivial MR, mild LAE, mild RAE. Last seen by [name] 5/29. ACE inhibitor was held secondary to cough and she was placed on PPI. Was admitted last month with cellulitis.  She comes in today. She is here alone. It is quite a struggle for her to get her unaccompanied. She called at the end of last week with abdominal bloating, no appetite, weight fluctuaing and chronic leg edema. Could not attempt going to cardiac rehab. Lasix was increased over the weekend until seen today. She remains short of breath.   Overall, she is not doing well. She is very upset over the status of her health. She has been lightheaded and dizzy. No syncope. Feels weak especially with exertion. Some tightness in her abdomen and throat that she does not feel like is her angina. No response with NTG sl. Some coughing. Swelling has improved with the increase in the lasix. Remains anemic with low iron levels and a low B 12. Never seen hematology. She  is agreeable to restarting her iron. She has had trouble tolerating iron in the past. No PCP. She has been released from the clinic at [facility] from the cellulitis standpoint. Her weight now seems to be back within her baseline but is up still by our scales. Still with DOE. She has claudication which limits her activities as well. She has known PVD and sees [name] next month. In summary, she is just not dealing with the fact that her body is failing her at her age.   Current Outpatient Prescriptions on File Prior to Visit  Medication Sig Dispense Refill  . aspirin EC 325 MG EC tablet Take 325 mg by mouth daily.        [name] atorvastatin (LIPITOR) 40 MG tablet Take 1 tablet (40 mg total) by mouth at bedtime.  30 tablet  11  . brimonidine (ALPHAGAN) 0.2 % ophthalmic solution Place 1 drop into both eyes 2 (two) times daily.        Tery Sanfilippo Sodium (COLACE PO) Take 1 capsule by mouth daily as needed. For constipation      . ezetimibe (ZETIA) 10 MG tablet Take 1 tablet (10 mg total) by mouth daily.  30 tablet  6  . insulin glargine (LANTUS) 100 UNIT/ML injection Inject 20 Units into the skin daily with breakfast.  10 mL  5  . insulin lispro (HUMALOG PEN) 100 UNIT/ML injection Inject 0-9 Units into the skin 4 (four) times daily. Per sliding scale DO NOT GIVE WITHOUT CONSULTING PATIENT ON HER SLIDING SCALE      .  isosorbide mononitrate (IMDUR) 30 MG 24 hr tablet Take 1 tablet (30 mg total) by mouth daily.  30 tablet  11  . latanoprost (XALATAN) 0.005 % ophthalmic solution Place 1 drop into both eyes at bedtime.        [name] levothyroxine (SYNTHROID, LEVOTHROID) 125 MCG tablet Take 125 mcg by mouth daily.        . metoprolol succinate (TOPROL-XL) 25 MG 24 hr tablet Take 1 tablet (25 mg total) by mouth daily.  30 tablet  6  . nitroGLYCERIN (NITROSTAT) 0.4 MG SL tablet Place 0.4 mg under the tongue every 5 (five) minutes as needed. For chest pain      . DISCONTD: furosemide (LASIX) 20 MG tablet Take 1 tablet (20 mg  total) by mouth daily.  30 tablet  3  . DISCONTD: insulin glargine (LANTUS) 100 UNIT/ML injection Inject 6 Units into the skin at bedtime.  10 mL  3  . benzonatate (TESSALON) 100 MG capsule Take 100 mg by mouth 3 (three) times daily as needed. For cough      . diphenhydrAMINE (BENADRYL) 25 mg capsule Take 1 capsule (25 mg total) by mouth every 6 (six) hours as needed for itching.  60 capsule  2  . omeprazole (PRILOSEC) 40 MG capsule Take 1 capsule (40 mg total) by mouth daily.  30 capsule  6  . DISCONTD: nitroGLYCERIN (NITROSTAT) 0.4 MG SL tablet Place 1 tablet (0.4 mg total) under the tongue every 5 (five) minutes as needed for chest pain.  25 tablet  3    Allergies  Allergen Reactions  . Adhesive (Tape) Other (See Comments)    "takes my skin off"  . Cephalexin Swelling and Rash  . Ciprofloxacin Swelling and Rash    Past Medical History  Diagnosis Date  . HTN (hypertension)   . Hypothyroidism   . Diabetes mellitus, type 2     insulin dependent  . History of recurrent UTIs   . CAD (coronary artery disease)     s/p CABG x3 in 1993; last cath in 2010  . Scleroderma   . Bilateral carpal tunnel syndrome 1970s  . 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 Followed by [name]  . Arthritis   . Anemia   . Claudication   . Aortic stenosis     0.8cm2 on echo in 10/2011  . Hyperlipidemia     on Lipitor  . Myocardial infarction 1986  . Seizure     ? seizure like event in 2010; workup included stress testing which led to repeat cath.   . Cat bite 2009    with MRSA; required I & D  . Heart murmur   . Shortness of breath   . Cancer     breast cancer  . Blood transfusion     no reaction from transfusion  . GERD (gastroesophageal reflux disease)     Past Surgical History  Procedure Date  . Carpal tunnel release   . 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 Vitrectomy   . Tubal ligation   . Tonsillectomy   . Coronary artery bypass  graft 1993    LIMA to LAD, SVG to distal LCX & SVG to Marginal  . Appendectomy   . Amputation     left first and second toes  . Cardiac catheterization 2010    LIMA to LAD patent yet with occluded distal LAD after graft insertion & retrograde is  occluded. 3-4 septal perforators arise &are patent. SVG to a large marginal patent; SVG presumably to the distal dominant LCX is occluded, moderately high grade stenosis of the AV circumflex, but with distal stenoses involving a severely diseased marginal branch not amenable  to PCI  nor is inferior branch     History  Smoking status  . Never Smoker   Smokeless tobacco  . Never Used    History  Alcohol Use No    Family History  Problem Relation Age of Onset  . Diabetes Mother     Review of Systems: The review of systems is per the HPI.  All other systems were reviewed and are negative.  Physical Exam: BP 140/56  Pulse 70  Ht 5' 3.5" (1.613 m)  Wt 171 lb (77.565 kg)  BMI 29.82 kg/m2 Patient is very pleasant and in no acute distress. She does look chronically ill. Her color is quite sallow. Skin is warm and dry. Color is normal.  HEENT is unremarkable. Normocephalic/atraumatic. PERRL. Sclera are nonicteric. Neck is supple. No masses. No JVD. Lungs are clear. Cardiac exam shows a regular rate and rhythm. She has a 2/6 systolic murmur. Abdomen is soft. Extremities are with trace edema. Gait and ROM are intact. She is using a walker. No gross neurologic deficits noted.   LABORATORY DATA: PENDING FOR TODAY   Lab Results  Component Value Date   WBC 4.0 04/09/2012   HGB 9.3* 04/09/2012   HCT 30.6* 04/09/2012   PLT 238 04/09/2012   GLUCOSE 175* 04/15/2012   CHOL 85 11/13/2011   TRIG 49 11/13/2011   HDL 33* 11/13/2011   LDLDIRECT 132.7 04/22/2007   LDLCALC 42 11/13/2011   ALT 14 04/03/2012   AST 21 04/03/2012   NA 135 04/15/2012   K 4.7 04/15/2012   CL 102 04/15/2012   CREATININE 1.60* 04/15/2012   BUN 26* 04/15/2012   CO2 26 04/15/2012   TSH 1.052  04/05/2012   INR 1.02 11/15/2011   HGBA1C 10.2* 04/05/2012     Assessment / Plan:

## 2012-05-14 ENCOUNTER — Telehealth: Payer: Self-pay | Admitting: Internal Medicine

## 2012-05-14 NOTE — Telephone Encounter (Signed)
Pt rtn your call

## 2012-05-15 NOTE — Telephone Encounter (Signed)
LMOM for call back for lab results.

## 2012-05-15 NOTE — Telephone Encounter (Signed)
Pt rtn call to get lab results °

## 2012-05-15 NOTE — Telephone Encounter (Signed)
Called patient with lab results.  

## 2012-05-16 ENCOUNTER — Telehealth: Payer: Self-pay | Admitting: *Deleted

## 2012-05-16 NOTE — Telephone Encounter (Signed)
patient confirmed over the phone the new date and time patient requested late august to see the md

## 2012-05-26 ENCOUNTER — Telehealth: Payer: Self-pay | Admitting: Oncology

## 2012-05-26 NOTE — Telephone Encounter (Signed)
Delivered chart °

## 2012-06-02 ENCOUNTER — Other Ambulatory Visit: Payer: Self-pay | Admitting: *Deleted

## 2012-06-02 DIAGNOSIS — D599 Acquired hemolytic anemia, unspecified: Secondary | ICD-10-CM

## 2012-06-02 DIAGNOSIS — D649 Anemia, unspecified: Secondary | ICD-10-CM

## 2012-06-03 ENCOUNTER — Ambulatory Visit (HOSPITAL_BASED_OUTPATIENT_CLINIC_OR_DEPARTMENT_OTHER): Payer: Medicare Other | Admitting: Oncology

## 2012-06-03 ENCOUNTER — Telehealth: Payer: Self-pay | Admitting: *Deleted

## 2012-06-03 ENCOUNTER — Ambulatory Visit: Payer: Medicare Other

## 2012-06-03 ENCOUNTER — Other Ambulatory Visit (HOSPITAL_BASED_OUTPATIENT_CLINIC_OR_DEPARTMENT_OTHER): Payer: Medicare Other | Admitting: Lab

## 2012-06-03 ENCOUNTER — Encounter: Payer: Self-pay | Admitting: Oncology

## 2012-06-03 VITALS — BP 112/69 | HR 85 | Temp 98.9°F | Resp 20 | Ht 63.5 in | Wt 169.3 lb

## 2012-06-03 DIAGNOSIS — D509 Iron deficiency anemia, unspecified: Secondary | ICD-10-CM | POA: Diagnosis not present

## 2012-06-03 DIAGNOSIS — D599 Acquired hemolytic anemia, unspecified: Secondary | ICD-10-CM

## 2012-06-03 DIAGNOSIS — E538 Deficiency of other specified B group vitamins: Secondary | ICD-10-CM

## 2012-06-03 DIAGNOSIS — D649 Anemia, unspecified: Secondary | ICD-10-CM | POA: Diagnosis not present

## 2012-06-03 DIAGNOSIS — D518 Other vitamin B12 deficiency anemias: Secondary | ICD-10-CM

## 2012-06-03 DIAGNOSIS — N189 Chronic kidney disease, unspecified: Secondary | ICD-10-CM | POA: Diagnosis not present

## 2012-06-03 DIAGNOSIS — D638 Anemia in other chronic diseases classified elsewhere: Secondary | ICD-10-CM | POA: Diagnosis not present

## 2012-06-03 LAB — CBC WITH DIFFERENTIAL/PLATELET
BASO%: 0.7 % (ref 0.0–2.0)
Basophils Absolute: 0 10*3/uL (ref 0.0–0.1)
EOS%: 5.8 % (ref 0.0–7.0)
HCT: 33.9 % — ABNORMAL LOW (ref 34.8–46.6)
HGB: 10.4 g/dL — ABNORMAL LOW (ref 11.6–15.9)
LYMPH%: 11.3 % — ABNORMAL LOW (ref 14.0–49.7)
MCH: 23.7 pg — ABNORMAL LOW (ref 25.1–34.0)
MCHC: 30.7 g/dL — ABNORMAL LOW (ref 31.5–36.0)
MONO#: 0.7 10*3/uL (ref 0.1–0.9)
NEUT%: 70.5 % (ref 38.4–76.8)
Platelets: 299 10*3/uL (ref 145–400)

## 2012-06-03 LAB — IRON AND TIBC
%SAT: 21 % (ref 20–55)
TIBC: 346 ug/dL (ref 250–470)
UIBC: 273 ug/dL (ref 125–400)

## 2012-06-03 LAB — COMPREHENSIVE METABOLIC PANEL
ALT: 16 U/L (ref 0–35)
AST: 27 U/L (ref 0–37)
BUN: 24 mg/dL — ABNORMAL HIGH (ref 6–23)
Calcium: 9.3 mg/dL (ref 8.4–10.5)
Chloride: 100 mEq/L (ref 96–112)
Creatinine, Ser: 1.14 mg/dL — ABNORMAL HIGH (ref 0.50–1.10)
Total Bilirubin: 0.6 mg/dL (ref 0.3–1.2)

## 2012-06-03 LAB — FERRITIN: Ferritin: 22 ng/mL (ref 10–291)

## 2012-06-03 MED ORDER — CYANOCOBALAMIN 1000 MCG/ML IJ SOLN
1000.0000 ug | Freq: Once | INTRAMUSCULAR | Status: AC
  Administered 2012-06-03: 1000 ug via INTRAMUSCULAR

## 2012-06-03 NOTE — Telephone Encounter (Signed)
feraheme infusion in 1 week sent [name] email to set up iv iron  see kk on 08/07/12   B12 injections every 4 weeks x 10 injections

## 2012-06-03 NOTE — Patient Instructions (Addendum)
B12 injections every 4 weeks  IV iron on 06/10/12  I will see you back on 08/07/12

## 2012-06-03 NOTE — Progress Notes (Signed)
[facility]  Telephone:(336) [phone] Fax:([phone]    INITIAL HEMATOLOGY CONSULTATION   Referral MD:   Dr. Dietrich Pates  Reason for Referral: 66 year old female with iron deficiency and B12 deficiency anemia/most likely multifactorial anemia. Patient is seen for discussion of treatment options and evaluation.  HPI: Very pleasant 66 year old female with multiple medical problems including coronary artery disease patient is status post coronary artery bypass graft. She also has aortic stenosis diabetes dyslipidemia early chronic kidney disease. Patient was recently seen by her cardiologist practice by Norma Fredrickson. She complained of significant weakness fatigue not doing well she had lightheadedness dizziness she also felt weak especially with exertion she was short of breath. Because of this patient had a CBC performed she was noted to have a hemoglobin of 9.4 hematocrit 30.7 MCV was 73.3. Patient did have a BUN that was elevated at 27 with a creatinine of 1.5. Patient also had B12 studies performed as well as iron studies and they were low including B12. It was recommended that patient undergo replacement for B12. However patient was not ready for this. She however was begun on oral iron which she has been taking on a daily basis. She was also referred to hematology for further evaluation and management of her multifactorial anemia.    Past Medical History  Diagnosis Date  . HTN (hypertension)   . Hypothyroidism   . Diabetes mellitus, type 2     insulin dependent  . History of recurrent UTIs   . CAD (coronary artery disease)     s/p CABG x3 in 1993; last cath in 2010  . Scleroderma   . Bilateral carpal tunnel syndrome 1970s  . 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 Followed by [name]  . Arthritis   . Anemia   . Claudication   . Aortic stenosis     0.8cm2 on echo in 10/2011  . Hyperlipidemia      on Lipitor  . Myocardial infarction 1986  . Seizure     ? seizure like event in 2010; workup included stress testing which led to repeat cath.   . Cat bite 2009    with MRSA; required I & D  . Heart murmur   . Shortness of breath   . Cancer     breast cancer  . Blood transfusion     no reaction from transfusion  . GERD (gastroesophageal reflux disease)   :    Past Surgical History  Procedure Date  . Carpal tunnel release   . 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 Vitrectomy   . Tubal ligation   . Tonsillectomy   . Coronary artery bypass graft 1993    LIMA to LAD, SVG to distal LCX & SVG to Marginal  . Appendectomy   . Amputation     left first and second toes  . Cardiac catheterization 2010    LIMA to LAD patent yet with occluded distal LAD after graft insertion & retrograde is occluded. 3-4 septal perforators arise &are patent. SVG to a large marginal patent; SVG presumably to the distal dominant LCX is occluded, moderately high grade stenosis of the AV circumflex, but with distal stenoses involving a severely diseased marginal branch not amenable  to PCI  nor is inferior branch   :   CURRENT MEDS: Current Outpatient Prescriptions  Medication Sig Dispense Refill  . aspirin EC 325 MG EC tablet Take 325  mg by mouth daily.        [name] atorvastatin (LIPITOR) 40 MG tablet Take 1 tablet (40 mg total) by mouth at bedtime.  30 tablet  11  . brimonidine (ALPHAGAN) 0.2 % ophthalmic solution Place 1 drop into both eyes 2 (two) times daily.        [name] ezetimibe (ZETIA) 10 MG tablet Take 1 tablet (10 mg total) by mouth daily.  30 tablet  6  . furosemide (LASIX) 20 MG tablet Take 40 mg by mouth daily.      . insulin glargine (LANTUS) 100 UNIT/ML injection Inject 20 Units into the skin daily with breakfast.  10 mL  5  . insulin glargine (LANTUS) 100 UNIT/ML injection Inject 8 Units into the skin at bedtime.      . insulin lispro (HUMALOG PEN) 100 UNIT/ML injection Inject 0-9 Units into the skin 4 (four)  times daily. Per sliding scale DO NOT GIVE WITHOUT CONSULTING PATIENT ON HER SLIDING SCALE      . isosorbide mononitrate (IMDUR) 30 MG 24 hr tablet Take 1 tablet (30 mg total) by mouth daily.  30 tablet  11  . latanoprost (XALATAN) 0.005 % ophthalmic solution Place 1 drop into both eyes at bedtime.        [name] levothyroxine (SYNTHROID, LEVOTHROID) 125 MCG tablet Take 125 mcg by mouth daily.        . metoprolol succinate (TOPROL-XL) 25 MG 24 hr tablet Take 1 tablet (25 mg total) by mouth daily.  30 tablet  6  . nitroGLYCERIN (NITROSTAT) 0.4 MG SL tablet Place 0.4 mg under the tongue every 5 (five) minutes as needed. For chest pain      . benzonatate (TESSALON) 100 MG capsule Take 100 mg by mouth 3 (three) times daily as needed. For cough      . diphenhydrAMINE (BENADRYL) 25 mg capsule Take 1 capsule (25 mg total) by mouth every 6 (six) hours as needed for itching.  60 capsule  2  . Docusate Sodium (COLACE PO) Take 1 capsule by mouth daily as needed. For constipation      . hydrOXYzine (ATARAX/VISTARIL) 25 MG tablet Take 25 mg by mouth 3 (three) times daily as needed.      [name] omeprazole (PRILOSEC) 40 MG capsule Take 1 capsule (40 mg total) by mouth daily.  30 capsule  6  . DISCONTD: nitroGLYCERIN (NITROSTAT) 0.4 MG SL tablet Place 1 tablet (0.4 mg total) under the tongue every 5 (five) minutes as needed for chest pain.  25 tablet  3      Allergies  Allergen Reactions  . Adhesive (Tape) Other (See Comments)    "takes my skin off"  . Cephalexin Swelling and Rash  . Ciprofloxacin Swelling and Rash  :  Family History  Problem Relation Age of Onset  . Diabetes Mother   :  History   Social History  . Marital Status: Single    Spouse Name: N/A    Number of Children: N/A  . Years of Education: N/A   Occupational History  . Environmental health practitioner    Social History Main Topics  . Smoking status: Never Smoker   . Smokeless tobacco: Never Used  . Alcohol Use: No  . Drug Use: No  .  Sexually Active: Not Currently    Birth Control/ Protection: Post-menopausal   Other Topics Concern  . Not on file   Social History Narrative  . No narrative on file  :  REVIEW OF SYSTEM:  Patient is weak tired fatigued she does have difficulty in ambulation but she denies any bleeding problems she has no nausea vomiting no fevers chills or night sweats. Her blood pressure is well controlled. She no longer has dizziness. She denies having any myalgias or arthralgias. She denies any chest pains or palpitations. She denies having any hematuria hematochezia melena hemoptysis hematemesis no easy bruising. Remainder of the 10 point review of systems is negative. Exam: Filed Vitals:   06/03/12 1544  BP: 112/69  Pulse: 85  Temp: 98.9 F (37.2 C)  TempSrc: Oral  Resp: 20  Height: 5' 3.5" (1.613 m)  Weight: 169 lb 4.8 oz (76.794 kg)   HEENT: EOMI PERRLA sclerae anicteric conjunctival pallor is noted neck is supple no palpable cervical supraclavicular or axillary adenopathy lungs scattered rales cardiovascular regular rate rhythm abdomen soft nontender nondistended bowel sounds are present no hepatosplenomegaly extremities edema is noted neuro patient's alert oriented otherwise nonfocal.Skin no rashes   LABS:  Lab Results  Component Value Date   WBC 5.7 06/03/2012   HGB 10.4* 06/03/2012   HCT 33.9* 06/03/2012   PLT 299 06/03/2012   GLUCOSE 260* 05/13/2012   CHOL 85 11/13/2011   TRIG 49 11/13/2011   HDL 33* 11/13/2011   LDLDIRECT 132.7 04/22/2007   LDLCALC 42 11/13/2011   ALT 14 04/03/2012   AST 21 04/03/2012   NA 139 05/13/2012   K 4.1 05/13/2012   CL 105 05/13/2012   CREATININE 1.5* 05/13/2012   BUN 27* 05/13/2012   CO2 26 05/13/2012   INR 1.02 11/15/2011   HGBA1C 10.2* 04/05/2012    No results found.  ASSESSMENT AND PLAN: 66 year old female with  #1 multifactorial anemia due to iron deficiency as well as B12 deficiency renal insufficiency and chronic disease. Since patient started taking  oral iron her hemoglobin has improved it is now 10.4. Patient had lapsed for B12 and a did show that she was deficient in B12. She and I discussed this today. I do think she is an excellent candidate for replacement therapy. We discussed extensively the rationale for B12 replacement. We discussed the risks and benefits. After prolonged discussion she has agreed to proceed with B12 injections on a monthly basis. We will do at least 4-6 months of treatment and then repeat her B12 studies. We also discussed iron replacement I do think she is doing well with oral iron however she may gain significant benefit by giving her parenteral iron he discussed the risks and benefits of this as well. She was in agreement with that and therefore I have set her up to have this done in about one week's time. We extensively discussed the causes for anemia and therapies that are available.  #2 I will plan on seeing the patient back in a few months time in followup. At that time we will check her iron studies as well as B12 studies.  #3 in the meantime she will continue to be seen by her cardiologist as well as her primary care physician. I do think that her renal function needs to be monitored closely. We discussed this today as well.  The length of time of the face-to-face encounter was 60    minutes. More than 50% of time was spent counseling and coordination of care.  Drue Second, MD Medical/Oncology [facility] [phone] (beeper) [phone] (Office)  06/11/2012, 8:22 AM

## 2012-06-04 ENCOUNTER — Telehealth: Payer: Self-pay

## 2012-06-04 NOTE — Telephone Encounter (Signed)
Per staff message and POF I have scheduled appt. TMB 

## 2012-06-06 ENCOUNTER — Telehealth: Payer: Self-pay | Admitting: *Deleted

## 2012-06-06 NOTE — Telephone Encounter (Signed)
Message copied by Cooper Render on Fri Jun 06, 2012  3:55 PM ------      Message from: Victorino December      Created: Thu Jun 05, 2012  9:16 AM       Call patient: iron studies slowly improving, still needs iv iron and B12 injections

## 2012-06-06 NOTE — Telephone Encounter (Signed)
Per MD, notified pt Iron studies slowly improving, still needs iv Iron and B12. Pt received B12 at last office visist 8/20 due 9/17. \ Pt scheduled for Infusion 8/27 9am. Requested pt arrive approx. 0845 to register. Pt confirmed understanding.

## 2012-06-10 ENCOUNTER — Telehealth: Payer: Self-pay | Admitting: *Deleted

## 2012-06-10 ENCOUNTER — Other Ambulatory Visit: Payer: Self-pay | Admitting: *Deleted

## 2012-06-10 ENCOUNTER — Ambulatory Visit: Payer: Medicare Other

## 2012-06-10 DIAGNOSIS — Z48812 Encounter for surgical aftercare following surgery on the circulatory system: Secondary | ICD-10-CM

## 2012-06-10 DIAGNOSIS — I739 Peripheral vascular disease, unspecified: Secondary | ICD-10-CM

## 2012-06-10 NOTE — Telephone Encounter (Signed)
Per staff message I have scheduled appt. JMW 

## 2012-06-12 ENCOUNTER — Other Ambulatory Visit: Payer: Self-pay | Admitting: Oncology

## 2012-06-12 ENCOUNTER — Ambulatory Visit (INDEPENDENT_AMBULATORY_CARE_PROVIDER_SITE_OTHER): Payer: Medicare Other | Admitting: Internal Medicine

## 2012-06-12 ENCOUNTER — Encounter: Payer: Self-pay | Admitting: Internal Medicine

## 2012-06-12 VITALS — BP 140/72 | HR 80 | Ht 63.0 in | Wt 166.0 lb

## 2012-06-12 DIAGNOSIS — R0602 Shortness of breath: Secondary | ICD-10-CM

## 2012-06-12 DIAGNOSIS — I509 Heart failure, unspecified: Secondary | ICD-10-CM

## 2012-06-12 LAB — BASIC METABOLIC PANEL WITH GFR
BUN: 23 mg/dL (ref 6–23)
CO2: 28 meq/L (ref 19–32)
Calcium: 9 mg/dL (ref 8.4–10.5)
Chloride: 101 meq/L (ref 96–112)
Creatinine, Ser: 1.2 mg/dL (ref 0.4–1.2)
GFR: 46.29 mL/min — ABNORMAL LOW
Glucose, Bld: 200 mg/dL — ABNORMAL HIGH (ref 70–99)
Potassium: 4.1 meq/L (ref 3.5–5.1)
Sodium: 137 meq/L (ref 135–145)

## 2012-06-12 LAB — BRAIN NATRIURETIC PEPTIDE: Pro B Natriuretic peptide (BNP): 884 pg/mL — ABNORMAL HIGH (ref 0.0–100.0)

## 2012-06-12 NOTE — Progress Notes (Signed)
HPI [name] is seen today for a work in visit. She is seen for [name]. She has multiple medical issues which include a history of CAD (s/p CABG- cath in 09/2011 showed patent LIMA to LAD with occlusion of distal LAD; High grade stenosis of nondominant RCA; Signif disease of prox LCx; SVG to L PDA occluded, SVG to OM patent), Aortic stenosis, DM, dyslipidemia, POVD, s/p revascularization - followed by [name], CKD. The patient underwent attempt of PTCA prox LCx by [name]. This was unsussessful and the procedure was aborted. The patient was evaluated by [name]. He thought she would be a very poor candidate for surgical revascularization. Med Rx continued. Echo 10/2011: Mild LVH, EF 30%, apex akinetic, septal HK, grade 2 diastolic dysfunction, or functionally bicuspid aortic valve, mild aortic stenosis by gradient, severe by calculated AVA-suspect A. Aortic stenosis probably moderate, trivial MR, mild LAE, mild RAE.   Patient was last seen by [name] in July.  Since then she has also been seen by [name] in [place].  She is now on B12 and Fe supplements.  She says that since these have started she is feeling much better.  She admits that some of effect may be psychologic.  But she does have more energy.  Finds she is able to do more, wants to do more. She does note painful swelling in thighs. Allergies  Allergen Reactions  . Adhesive (Tape) Other (See Comments)    "takes my skin off"  . Cephalexin Swelling and Rash  . Ciprofloxacin Swelling and Rash    Current Outpatient Prescriptions  Medication Sig Dispense Refill  . aspirin EC 325 MG EC tablet Take 325 mg by mouth daily.        [name] atorvastatin (LIPITOR) 40 MG tablet Take 1 tablet (40 mg total) by mouth at bedtime.  30 tablet  11  . brimonidine (ALPHAGAN) 0.2 % ophthalmic solution Place 1 drop into both eyes 2 (two) times daily.        . Cyanocobalamin (VITAMIN B-12 IJ) Inject as directed. monthly      . Docusate Sodium (COLACE PO)  Take 1 capsule by mouth daily as needed. For constipation      . ezetimibe (ZETIA) 10 MG tablet Take 1 tablet (10 mg total) by mouth daily.  30 tablet  6  . Ferrous Sulfate (SLOW RELEASE IRON) 50 MG TBCR Take by mouth. 1 tab daily      . furosemide (LASIX) 40 MG tablet Take 40 mg by mouth daily.      . hydrOXYzine (ATARAX/VISTARIL) 25 MG tablet Take 25 mg by mouth 3 (three) times daily as needed.      . insulin glargine (LANTUS) 100 UNIT/ML injection Inject 20 Units into the skin daily with breakfast.  10 mL  5  . insulin glargine (LANTUS) 100 UNIT/ML injection Inject 10 Units into the skin at bedtime.       . insulin lispro (HUMALOG PEN) 100 UNIT/ML injection Inject 0-9 Units into the skin 4 (four) times daily. Per sliding scale DO NOT GIVE WITHOUT CONSULTING PATIENT ON HER SLIDING SCALE      . isosorbide mononitrate (IMDUR) 30 MG 24 hr tablet Take 1 tablet (30 mg total) by mouth daily.  30 tablet  11  . latanoprost (XALATAN) 0.005 % ophthalmic solution Place 1 drop into both eyes at bedtime.        [name] levothyroxine (SYNTHROID, LEVOTHROID) 125 MCG tablet Take 125 mcg by mouth daily.        [name]  metoprolol succinate (TOPROL-XL) 25 MG 24 hr tablet Take 1 tablet (25 mg total) by mouth daily.  30 tablet  6  . nitroGLYCERIN (NITROSTAT) 0.4 MG SL tablet Place 0.4 mg under the tongue every 5 (five) minutes as needed. For chest pain      . diphenhydrAMINE (BENADRYL) 25 mg capsule Take 1 capsule (25 mg total) by mouth every 6 (six) hours as needed for itching.  60 capsule  2  . DISCONTD: nitroGLYCERIN (NITROSTAT) 0.4 MG SL tablet Place 1 tablet (0.4 mg total) under the tongue every 5 (five) minutes as needed for chest pain.  25 tablet  3    Past Medical History  Diagnosis Date  . HTN (hypertension)   . Hypothyroidism   . Diabetes mellitus, type 2     insulin dependent  . History of recurrent UTIs   . CAD (coronary artery disease)     s/p CABG x3 in 1993; last cath in 2010  . Scleroderma   . Bilateral  carpal tunnel syndrome 1970s  . 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 Followed by [name]  . Arthritis   . Anemia   . Claudication   . Aortic stenosis     0.8cm2 on echo in 10/2011  . Hyperlipidemia     on Lipitor  . Myocardial infarction 1986  . Seizure     ? seizure like event in 2010; workup included stress testing which led to repeat cath.   . Cat bite 2009    with MRSA; required I & D  . Heart murmur   . Shortness of breath   . Cancer     breast cancer  . Blood transfusion     no reaction from transfusion  . GERD (gastroesophageal reflux disease)     Past Surgical History  Procedure Date  . Carpal tunnel release   . 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 Vitrectomy   . Tubal ligation   . Tonsillectomy   . Coronary artery bypass graft 1993    LIMA to LAD, SVG to distal LCX & SVG to Marginal  . Appendectomy   . Amputation     left first and second toes  . Cardiac catheterization 2010    LIMA to LAD patent yet with occluded distal LAD after graft insertion & retrograde is occluded. 3-4 septal perforators arise &are patent. SVG to a large marginal patent; SVG presumably to the distal dominant LCX is occluded, moderately high grade stenosis of the AV circumflex, but with distal stenoses involving a severely diseased marginal branch not amenable  to PCI  nor is inferior branch     Family History  Problem Relation Age of Onset  . Diabetes Mother     History   Social History  . Marital Status: Single    Spouse Name: N/A    Number of Children: N/A  . Years of Education: N/A   Occupational History  . Environmental health practitioner    Social History Main Topics  . Smoking status: Never Smoker   . Smokeless tobacco: Never Used  . Alcohol Use: No  . Drug Use: No  . Sexually Active: Not Currently    Birth Control/ Protection: Post-menopausal   Other Topics Concern  . Not on file   Social History Narrative  . No  narrative on file    Review of Systems:  All systems reviewed.  They are negative to the above  problem except as previously stated.  Vital Signs: BP 140/72  Pulse 80  Ht 5\' 3"  (1.6 m)  Wt 166 lb (75.297 kg)  BMI 29.41 kg/m2  Physical Exam Patient is in NAD HEENT:  Normocephalic, atraumatic. EOMI, PERRLA.  Neck: JVP is increased.  No bruits.  Lungs: clear to auscultation. No rales no wheezes.  Heart: Regular rate and rhythm. Normal S1, S2. No S3.   No significant murmurs. PMI not displaced.  Abdomen:  Supple, nontender. Normal bowel sounds. No masses. No hepatomegaly.  Extremities:   . 1+ edema to thigh Musculoskeletal :moving all extremities.  Neuro:   alert and oriented x3.  CN II-XII grossly intact.   Assessment and Plan:  1.  CAD.  Patient with severe CAD that is not amenable to intervention.  She is doing better than when I saw her earlier this year.I would keep on same regimen.  COntinue activiites as tolerated Patient does have 1+ edema to thigh.  Will check labs including TSH  WIll call with results and what to do with her lasix.  2.  PVOD  Has f/u with Dr Early this fall.  3.  Anemia.  F/U with [name].  4.  Thyroid.  Check TSH  5.HL  On Zetia and lipitor  Good control last winter

## 2012-06-12 NOTE — Patient Instructions (Signed)
Lab work today We will call you with results and let you know when to follow up with Dr.Ross.

## 2012-06-13 ENCOUNTER — Encounter: Payer: Self-pay | Admitting: Neurosurgery

## 2012-06-13 ENCOUNTER — Ambulatory Visit (HOSPITAL_BASED_OUTPATIENT_CLINIC_OR_DEPARTMENT_OTHER): Payer: Medicare Other

## 2012-06-13 ENCOUNTER — Other Ambulatory Visit: Payer: Self-pay | Admitting: *Deleted

## 2012-06-13 VITALS — BP 147/69 | HR 72 | Temp 98.5°F

## 2012-06-13 DIAGNOSIS — D509 Iron deficiency anemia, unspecified: Secondary | ICD-10-CM

## 2012-06-13 HISTORY — DX: Iron deficiency anemia, unspecified: D50.9

## 2012-06-13 MED ORDER — SODIUM CHLORIDE 0.9 % IV SOLN
1020.0000 mg | Freq: Once | INTRAVENOUS | Status: AC
  Administered 2012-06-13: 1020 mg via INTRAVENOUS
  Filled 2012-06-13: qty 34

## 2012-06-13 MED ORDER — METOPROLOL SUCCINATE ER 25 MG PO TB24: 25.0000 mg | ORAL_TABLET | Freq: Every day | ORAL | Status: DC

## 2012-06-13 NOTE — Patient Instructions (Signed)

## 2012-06-13 NOTE — Progress Notes (Signed)
1st time feraheme. Patient vitals are stable and denies complaints 30 minutes post infusion.

## 2012-06-17 ENCOUNTER — Encounter: Payer: Self-pay | Admitting: Neurosurgery

## 2012-06-17 ENCOUNTER — Ambulatory Visit (INDEPENDENT_AMBULATORY_CARE_PROVIDER_SITE_OTHER): Payer: Medicare Other | Admitting: Neurosurgery

## 2012-06-17 ENCOUNTER — Encounter (INDEPENDENT_AMBULATORY_CARE_PROVIDER_SITE_OTHER): Payer: Medicare Other | Admitting: *Deleted

## 2012-06-17 ENCOUNTER — Ambulatory Visit (INDEPENDENT_AMBULATORY_CARE_PROVIDER_SITE_OTHER): Payer: Medicare Other | Admitting: *Deleted

## 2012-06-17 VITALS — BP 148/75 | HR 94 | Resp 16 | Ht 63.5 in | Wt 159.3 lb

## 2012-06-17 DIAGNOSIS — Z48812 Encounter for surgical aftercare following surgery on the circulatory system: Secondary | ICD-10-CM

## 2012-06-17 DIAGNOSIS — I739 Peripheral vascular disease, unspecified: Secondary | ICD-10-CM | POA: Diagnosis not present

## 2012-06-17 NOTE — Progress Notes (Signed)
[facility]/P[facility] Office Note  CC: PVD surveillance Referring Physician: Early  History of Present Illness: 66 year old female patient of [name] who status post left femoral anterior tibial bypass graft in July 2012. The patient states she is having problems with congestive heart failure and was recently hospitalized for cellulitis lower extremities. 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  Past Medical History  Diagnosis Date  . HTN (hypertension)   . Hypothyroidism   . Diabetes mellitus, type 2     insulin dependent  . History of recurrent UTIs   . CAD (coronary artery disease)     s/p CABG x3 in 1993; last cath in 2010  . Scleroderma   . Bilateral carpal tunnel syndrome 1970s  . 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 Followed by [name]  . Arthritis   . Anemia   . Claudication   . Aortic stenosis     0.8cm2 on echo in 10/2011  . Hyperlipidemia     on Lipitor  . Myocardial infarction 1986  . Seizure     ? seizure like event in 2010; workup included stress testing which led to repeat cath.   . Cat bite 2009    with MRSA; required I & D  . Heart murmur   . Shortness of breath   . Cancer     breast cancer  . Blood transfusion     no reaction from transfusion  . GERD (gastroesophageal reflux disease)     ROS: [x]  Positive   [ ]  Denies    General: [ ]  Weight loss, [ ]  Fever, [ ]  chills Neurologic: [ ]  Dizziness, [ ]  Blackouts, [ ]  Seizure [ ]  Stroke, [ ]  "Mini stroke", [ ]  Slurred speech, [ ]  Temporary blindness; [ ]  weakness in arms or legs, [ ]  Hoarseness Cardiac: [ ]  Chest pain/pressure, [ ]  Shortness of breath at rest [ ]  Shortness of breath with exertion, [ ]  Atrial fibrillation or irregular  heartbeat Vascular: [ ]  Pain in legs with walking, [ ]  Pain in legs at rest, [ ]  Pain in legs at night,  [ ]  Non-healing ulcer, [ ]  Blood clot in vein/DVT,   Pulmonary: [ ]  Home oxygen, [ ]  Productive cough, [ ]  Coughing up blood, [ ]  Asthma,  [ ]  Wheezing Musculoskeletal:  [ ]  Arthritis, [ ]  Low back pain, [ ]  Joint pain Hematologic: [ ]  Easy Bruising, [ ]  Anemia; [ ]  Hepatitis Gastrointestinal: [ ]  Blood in stool, [ ]  Gastroesophageal Reflux/heartburn, [ ]  Trouble swallowing Urinary: [ ]  chronic Kidney disease, [ ]  on HD - [ ]  MWF or [ ]  TTHS, [ ]  Burning with urination, [ ]  Difficulty urinating Skin: [ ]  Rashes, [ ]  Wounds Psychological: [ ]  Anxiety, [ ]  Depression   Social History History  Substance Use Topics  . Smoking status: Never Smoker   . Smokeless tobacco: Never Used  . Alcohol Use: No    Family History Family History  Problem Relation Age of Onset  . Diabetes Mother     Allergies  Allergen Reactions  . Adhesive (Tape) Other (See Comments)    "takes my skin off"  . Cephalexin Swelling and Rash  . Ciprofloxacin Swelling and Rash    Current  Outpatient Prescriptions  Medication Sig Dispense Refill  . aspirin EC 325 MG EC tablet Take 325 mg by mouth daily.        [name] atorvastatin (LIPITOR) 40 MG tablet Take 1 tablet (40 mg total) by mouth at bedtime.  30 tablet  11  . brimonidine (ALPHAGAN) 0.2 % ophthalmic solution Place 1 drop into both eyes 2 (two) times daily.        Tery Sanfilippo Sodium (COLACE PO) Take 1 capsule by mouth daily as needed. For constipation      . ezetimibe (ZETIA) 10 MG tablet Take 1 tablet (10 mg total) by mouth daily.  30 tablet  6  . Ferrous Sulfate (SLOW RELEASE IRON) 50 MG TBCR Take by mouth. 1 tab daily      . furosemide (LASIX) 20 MG tablet Take 20 mg by mouth 2 (two) times a week.      . furosemide (LASIX) 40 MG tablet 40mg  every day except 60mg  2 times per week.  30 tablet    . hydrOXYzine (ATARAX/VISTARIL) 25 MG tablet Take 25 mg by mouth  3 (three) times daily as needed.      . insulin glargine (LANTUS) 100 UNIT/ML injection Inject 20 Units into the skin daily with breakfast.  10 mL  5  . insulin glargine (LANTUS) 100 UNIT/ML injection Inject 10 Units into the skin at bedtime.       . insulin lispro (HUMALOG PEN) 100 UNIT/ML injection Inject 0-9 Units into the skin 4 (four) times daily. Per sliding scale DO NOT GIVE WITHOUT CONSULTING PATIENT ON HER SLIDING SCALE      . isosorbide mononitrate (IMDUR) 30 MG 24 hr tablet Take 1 tablet (30 mg total) by mouth daily.  30 tablet  11  . latanoprost (XALATAN) 0.005 % ophthalmic solution Place 1 drop into both eyes at bedtime.        [name] levothyroxine (SYNTHROID, LEVOTHROID) 125 MCG tablet Take 125 mcg by mouth daily.        . metoprolol succinate (TOPROL-XL) 25 MG 24 hr tablet Take 1 tablet (25 mg total) by mouth daily.  30 tablet  11  . nitroGLYCERIN (NITROSTAT) 0.4 MG SL tablet Place 0.4 mg under the tongue every 5 (five) minutes as needed. For chest pain      . Cyanocobalamin (VITAMIN B-12 IJ) Inject as directed. monthly      . diphenhydrAMINE (BENADRYL) 25 mg capsule Take 1 capsule (25 mg total) by mouth every 6 (six) hours as needed for itching.  60 capsule  2  . DISCONTD: nitroGLYCERIN (NITROSTAT) 0.4 MG SL tablet Place 1 tablet (0.4 mg total) under the tongue every 5 (five) minutes as needed for chest pain.  25 tablet  3    Physical Examination  Filed Vitals:   06/17/12 1603  BP: 148/75  Pulse: 94  Resp: 16    Body mass index is 27.78 kg/(m^2).  General:  WDWN in NAD Gait: Normal HEENT: WNL Eyes: Pupils equal Pulmonary: normal non-labored breathing , without Rales, rhonchi,  wheezing Cardiac: RRR, without  Murmurs, rubs or gallops; No carotid bruits Abdomen: soft, NT, no masses Skin: no rashes, ulcers noted Vascular Exam/Pulses: Lower extremity  pulses are not palpated but the cellulitis has healed, and she has good perfusion with no open ulcerations.  Extremities  without ischemic changes, no Gangrene , no cellulitis; no open wounds;  Musculoskeletal: no muscle wasting or atrophy  Neurologic: A&O X 3; Appropriate Affect ; SENSATION: normal; MOTOR FUNCTION:  moving all extremities equally. Speech is fluent/normal  Non-Invasive Vascular Imaging: ABIs today are 0.24 on the right, 0.64 on the left with a patent femoral to anterior tibial bypass graft. There is stenosis greater than 50% noted. This was reviewed with [name]  ASSESSMENT/PLAN: [name] spoke with patient at length regarding her cellulitis and possible aids to help with swelling such as Ace wraps versus compression hose. The patient will try both and see which one she can tolerate the best. [name] also explain to the patient that her bypass is functioning but the swelling can be an issue with the cracking of the skin and the possibility of infection the patient states understanding. The patient will followup in 6 months for repeat bypass graft evaluation ABIs. The patient's questions were encouraged and answered, she is in agreement with this plan.  Lauree Chandler ANP  Clinic M.D.: Early

## 2012-06-18 NOTE — Addendum Note (Signed)
Addended by: Sharee Pimple on: 06/18/2012 10:01 AM   Modules accepted: Orders

## 2012-06-20 DIAGNOSIS — I10 Essential (primary) hypertension: Secondary | ICD-10-CM | POA: Diagnosis not present

## 2012-06-20 DIAGNOSIS — E109 Type 1 diabetes mellitus without complications: Secondary | ICD-10-CM | POA: Diagnosis not present

## 2012-06-20 DIAGNOSIS — R809 Proteinuria, unspecified: Secondary | ICD-10-CM | POA: Diagnosis not present

## 2012-06-20 DIAGNOSIS — E039 Hypothyroidism, unspecified: Secondary | ICD-10-CM | POA: Diagnosis not present

## 2012-06-25 ENCOUNTER — Telehealth: Payer: Self-pay | Admitting: Internal Medicine

## 2012-06-25 NOTE — Telephone Encounter (Signed)
New Problem:    Called wanting you to give her a call back.  Please call back, patient is aware that you were not in the office when this message was taken.

## 2012-06-26 NOTE — Telephone Encounter (Signed)
Called patient back. She wanted to let us know that she is feeling much better. Extra lasix each week is helping. No complaints of SOB. Weight stable at 159 pounds. Dr.Early advised that pressures in her legs are the same. She saw Dr.Balan recently and was told that her HGBA1C was 8.0 which is the best it has ever been for her.  Will let Dr.Ross know that she called.

## 2012-07-01 ENCOUNTER — Ambulatory Visit (HOSPITAL_BASED_OUTPATIENT_CLINIC_OR_DEPARTMENT_OTHER): Payer: Medicare Other

## 2012-07-01 VITALS — BP 118/52 | HR 68 | Temp 98.3°F

## 2012-07-01 DIAGNOSIS — E538 Deficiency of other specified B group vitamins: Secondary | ICD-10-CM

## 2012-07-01 DIAGNOSIS — D518 Other vitamin B12 deficiency anemias: Secondary | ICD-10-CM

## 2012-07-01 MED ORDER — CYANOCOBALAMIN 1000 MCG/ML IJ SOLN
1000.0000 ug | Freq: Once | INTRAMUSCULAR | Status: AC
  Administered 2012-07-01: 1000 ug via INTRAMUSCULAR

## 2012-07-25 ENCOUNTER — Other Ambulatory Visit: Payer: Self-pay

## 2012-07-25 MED ORDER — EZETIMIBE 10 MG PO TABS: 10.0000 mg | ORAL_TABLET | Freq: Every day | ORAL | Status: DC

## 2012-07-29 ENCOUNTER — Telehealth: Payer: Self-pay | Admitting: *Deleted

## 2012-07-29 NOTE — Telephone Encounter (Signed)
patient called in and rescheduled injection to 07-31-2012 at 8:00am

## 2012-07-30 ENCOUNTER — Ambulatory Visit: Payer: Medicare Other

## 2012-07-31 ENCOUNTER — Ambulatory Visit (HOSPITAL_BASED_OUTPATIENT_CLINIC_OR_DEPARTMENT_OTHER): Payer: Medicare Other

## 2012-07-31 VITALS — BP 144/50 | HR 72 | Temp 97.5°F

## 2012-07-31 DIAGNOSIS — D518 Other vitamin B12 deficiency anemias: Secondary | ICD-10-CM

## 2012-07-31 DIAGNOSIS — E538 Deficiency of other specified B group vitamins: Secondary | ICD-10-CM

## 2012-07-31 MED ORDER — CYANOCOBALAMIN 1000 MCG/ML IJ SOLN
1000.0000 ug | Freq: Once | INTRAMUSCULAR | Status: AC
  Administered 2012-07-31: 1000 ug via INTRAMUSCULAR

## 2012-08-07 ENCOUNTER — Ambulatory Visit: Payer: Medicare Other | Admitting: Lab

## 2012-08-07 ENCOUNTER — Ambulatory Visit (HOSPITAL_BASED_OUTPATIENT_CLINIC_OR_DEPARTMENT_OTHER): Payer: Medicare Other | Admitting: Oncology

## 2012-08-07 ENCOUNTER — Other Ambulatory Visit (HOSPITAL_BASED_OUTPATIENT_CLINIC_OR_DEPARTMENT_OTHER): Payer: Medicare Other | Admitting: Lab

## 2012-08-07 ENCOUNTER — Telehealth: Payer: Self-pay | Admitting: *Deleted

## 2012-08-07 ENCOUNTER — Encounter: Payer: Self-pay | Admitting: Oncology

## 2012-08-07 VITALS — BP 136/69 | HR 72 | Temp 98.4°F | Resp 20 | Ht 63.5 in | Wt 155.5 lb

## 2012-08-07 DIAGNOSIS — D518 Other vitamin B12 deficiency anemias: Secondary | ICD-10-CM | POA: Diagnosis not present

## 2012-08-07 DIAGNOSIS — E538 Deficiency of other specified B group vitamins: Secondary | ICD-10-CM

## 2012-08-07 DIAGNOSIS — D649 Anemia, unspecified: Secondary | ICD-10-CM

## 2012-08-07 DIAGNOSIS — L03115 Cellulitis of right lower limb: Secondary | ICD-10-CM

## 2012-08-07 DIAGNOSIS — D509 Iron deficiency anemia, unspecified: Secondary | ICD-10-CM | POA: Diagnosis not present

## 2012-08-07 LAB — CBC WITH DIFFERENTIAL/PLATELET
Basophils Absolute: 0 10*3/uL (ref 0.0–0.1)
EOS%: 7.1 % — ABNORMAL HIGH (ref 0.0–7.0)
Eosinophils Absolute: 0.4 10*3/uL (ref 0.0–0.5)
HCT: 39.8 % (ref 34.8–46.6)
HGB: 12.9 g/dL (ref 11.6–15.9)
MCH: 29 pg (ref 25.1–34.0)
MCV: 89 fL (ref 79.5–101.0)
MONO%: 15.3 % — ABNORMAL HIGH (ref 0.0–14.0)
NEUT#: 3.5 10*3/uL (ref 1.5–6.5)
NEUT%: 59.3 % (ref 38.4–76.8)
RDW: 19.3 % — ABNORMAL HIGH (ref 11.2–14.5)

## 2012-08-07 LAB — BASIC METABOLIC PANEL (CC13)
Calcium: 9.8 mg/dL (ref 8.4–10.4)
Sodium: 137 mEq/L (ref 136–145)

## 2012-08-07 LAB — VITAMIN B12: Vitamin B-12: 676 pg/mL (ref 211–911)

## 2012-08-07 LAB — IRON AND TIBC
TIBC: 230 ug/dL — ABNORMAL LOW (ref 250–470)
UIBC: 164 ug/dL (ref 125–400)

## 2012-08-07 NOTE — Patient Instructions (Addendum)
Continue with B12 injections and oral iron  I will see you back in office in 3 months

## 2012-08-07 NOTE — Progress Notes (Signed)
OFFICE PROGRESS NOTE  CC  [name], MD [address] [place] [address]  DIAGNOSIS: 66 year old female with iron deficiency and B12 deficiency anemia  PRIOR THERAPY:  #1 patient is status post parenteral iron and currently receiving B12 injections every month.  CURRENT THERAPY:B12 injections q. monthly  INTERVAL HISTORY: [name] 66 y.o. female returns forFollowup visit. [name] visit with me was back in August. At that time she was found to have severe B12 deficiency.. She was also noted to be iron deficient as well. We recommended placement for B12 as well as parenteral iron. She agreed. Today she returns in followup. She tells me that it is amazing how well she feels. This is certainly indicated by [name] blood count today which is now normal.  And it is the best it has been in quite some long time. She denies any fevers chills night sweats headaches shortness of breath chest pains palpitations. She does have can to need lower extremity swelling. She denies any bleeding problems no chest pains no recent hospitalizations. Remainder of the review of systems is negative.  MEDICAL HISTORY: Past Medical History  Diagnosis Date  . HTN (hypertension)   . Hypothyroidism   . Diabetes mellitus, type 2     insulin dependent  . History of recurrent UTIs   . CAD (coronary artery disease)     s/p CABG x3 in 1993; last cath in 2010  . Scleroderma   . Bilateral carpal tunnel syndrome 1970s  . 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 Followed by [name]  . Arthritis   . Anemia   . Claudication   . Aortic stenosis     0.8cm2 on echo in 10/2011  . Hyperlipidemia     on Lipitor  . Myocardial infarction 1986  . Seizure     ? seizure like event in 2010; workup included stress testing which led to repeat cath.   . Cat bite 2009    with MRSA; required I & D  . Heart murmur   . Shortness of breath     . Cancer     breast cancer  . Blood transfusion     no reaction from transfusion  . GERD (gastroesophageal reflux disease)   . CHF (congestive heart failure) Aug. 2012  . B12 deficiency anemia Aug. 2013    B 12 injection    ALLERGIES:  is allergic to adhesive; cephalexin; and ciprofloxacin.  MEDICATIONS:  Current Outpatient Prescriptions  Medication Sig Dispense Refill  . aspirin EC 325 MG EC tablet Take 325 mg by mouth daily.        [name] atorvastatin (LIPITOR) 40 MG tablet Take 1 tablet (40 mg total) by mouth at bedtime.  30 tablet  11  . brimonidine (ALPHAGAN) 0.2 % ophthalmic solution Place 1 drop into both eyes 2 (two) times daily.        . Cyanocobalamin (VITAMIN B-12 IJ) Inject as directed. monthly      . Docusate Sodium (COLACE PO) Take 1 capsule by mouth daily as needed. For constipation      . ezetimibe (ZETIA) 10 MG tablet Take 1 tablet (10 mg total) by mouth daily.  30 tablet  6  . Ferrous Sulfate (SLOW RELEASE IRON) 50 MG TBCR Take by mouth. 1 tab daily      . furosemide (LASIX) 20 MG tablet Take 20 mg by mouth  2 (two) times a week.      . furosemide (LASIX) 40 MG tablet 40mg  every day except 60mg  2 times per week.  30 tablet    . hydrOXYzine (ATARAX/VISTARIL) 25 MG tablet Take 25 mg by mouth 3 (three) times daily as needed.      . insulin glargine (LANTUS) 100 UNIT/ML injection Inject 20 Units into the skin daily with breakfast.  10 mL  5  . insulin glargine (LANTUS) 100 UNIT/ML injection Inject 10 Units into the skin at bedtime.       . insulin lispro (HUMALOG PEN) 100 UNIT/ML injection Inject 0-9 Units into the skin 4 (four) times daily. Per sliding scale DO NOT GIVE WITHOUT CONSULTING PATIENT ON [name] SLIDING SCALE      . isosorbide mononitrate (IMDUR) 30 MG 24 hr tablet Take 1 tablet (30 mg total) by mouth daily.  30 tablet  11  . latanoprost (XALATAN) 0.005 % ophthalmic solution Place 1 drop into both eyes at bedtime.        [name] levothyroxine (SYNTHROID, LEVOTHROID) 125 MCG  tablet Take 125 mcg by mouth daily.        . metoprolol succinate (TOPROL-XL) 25 MG 24 hr tablet Take 1 tablet (25 mg total) by mouth daily.  30 tablet  11  . nitroGLYCERIN (NITROSTAT) 0.4 MG SL tablet Place 0.4 mg under the tongue every 5 (five) minutes as needed. For chest pain      . diphenhydrAMINE (BENADRYL) 25 mg capsule Take 1 capsule (25 mg total) by mouth every 6 (six) hours as needed for itching.  60 capsule  2  . DISCONTD: nitroGLYCERIN (NITROSTAT) 0.4 MG SL tablet Place 1 tablet (0.4 mg total) under the tongue every 5 (five) minutes as needed for chest pain.  25 tablet  3    SURGICAL HISTORY:  Past Surgical History  Procedure Date  . Carpal tunnel release   . Mastectomy 11/1982    Left Breast  . Vitrectomy   . Tubal ligation   . Tonsillectomy   . Coronary artery bypass graft 1993    LIMA to LAD, SVG to distal LCX & SVG to Marginal  . Appendectomy   . Amputation     left first and second toes  . Cardiac catheterization 2010    LIMA to LAD patent yet with occluded distal LAD after graft insertion & retrograde is occluded. 3-4 septal perforators arise &are patent. SVG to a large marginal patent; SVG presumably to the distal dominant LCX is occluded, moderately high grade stenosis of the AV circumflex, but with distal stenoses involving a severely diseased marginal branch not amenable  to PCI  nor is inferior branch   . Iron infusion Aug. 30, 2013    REVIEW OF SYSTEMS:  Pertinent items are noted in HPI.   HEALTH MAINTENANCE:  Mammogram Colonoscopy Bone  Scan Pap Smear Eye Exam Vitamin D Lipid Panel  PHYSICAL EXAMINATION: Blood pressure 136/69, pulse 72, temperature 98.4 F (36.9 C), temperature source Oral, resp. rate 20, height 5' 3.5" (1.613 m), weight 155 lb 8 oz (70.534 kg). Body mass index is 27.11 kg/(m^2). ECOG PERFORMANCE STATUS: 1 - Symptomatic but completely ambulatory   General appearance: alert, cooperative and appears stated age Lymph nodes: Cervical,  supraclavicular, and axillary nodes normal. Resp: clear to auscultation bilaterally Back: symmetric, no curvature. ROM normal. No CVA tenderness. Cardio: regular rate and rhythm and systolic murmur: systolic ejection 2/6, buzzing at apex GI: soft, non-tender; bowel sounds normal; no masses,  no organomegaly Extremities: extremities normal, atraumatic, no cyanosis or edema Neurologic: Grossly normal   LABORATORY DATA: Lab Results  Component Value Date   WBC 5.9 08/07/2012   HGB 12.9 08/07/2012   HCT 39.8 08/07/2012   MCV 89.0 08/07/2012   PLT 207 08/07/2012      Chemistry      Component Value Date/Time   NA 137 06/12/2012 1201   NA 133* 11/29/2011 1629   K 4.1 06/12/2012 1201   CL 101 06/12/2012 1201   CO2 28 06/12/2012 1201   BUN 23 06/12/2012 1201   BUN 39* 11/29/2011 1629   CREATININE 1.2 06/12/2012 1201   CREATININE 1.60* 04/15/2012 1655      Component Value Date/Time   CALCIUM 9.0 06/12/2012 1201   ALKPHOS 115 06/03/2012 1529   AST 27 06/03/2012 1529   ALT 16 06/03/2012 1529   BILITOT 0.6 06/03/2012 1529       RADIOGRAPHIC STUDIES:  No results found.  ASSESSMENT: 66 year old female with B12 and iron deficiency anemia now significantly improved after having parenteral iron and B12 injections. Clinically she looks terrific. She is very pleased with [name] response to the therapy. We did check B12 level today.   PLAN: Patient will continue to have B12 injections every month. We will call [name] with the results of the iron studies. She will be seen back in 6 months time or sooner if need arises.   All questions were answered. The patient knows to call the clinic with any problems, questions or concerns. We can certainly see the patient much sooner if necessary.  I spent 20 minutes counseling the patient face to face. The total time spent in the appointment was 30 minutes.    Drue Second, MD Medical/Oncology [facility] [phone] (beeper) [phone]  (Office)  08/07/2012, 12:18 PM

## 2012-08-07 NOTE — Telephone Encounter (Signed)
Gave patient appointment for 11-26-2012 lab and md and injection  Sent patient back to the lab also

## 2012-08-24 ENCOUNTER — Encounter (HOSPITAL_COMMUNITY): Payer: Self-pay

## 2012-08-24 ENCOUNTER — Observation Stay (HOSPITAL_COMMUNITY)
Admission: EM | Admit: 2012-08-24 | Discharge: 2012-08-25 | Disposition: A | Discharge status: Home / Self Care | Payer: Medicare Other | Source: Home / Self Care | Attending: Emergency Medicine | Admitting: Emergency Medicine

## 2012-08-24 DIAGNOSIS — E119 Type 2 diabetes mellitus without complications: Secondary | ICD-10-CM | POA: Insufficient documentation

## 2012-08-24 DIAGNOSIS — L0390 Cellulitis, unspecified: Secondary | ICD-10-CM

## 2012-08-24 DIAGNOSIS — Z794 Long term (current) use of insulin: Secondary | ICD-10-CM | POA: Insufficient documentation

## 2012-08-24 DIAGNOSIS — E039 Hypothyroidism, unspecified: Secondary | ICD-10-CM | POA: Insufficient documentation

## 2012-08-24 DIAGNOSIS — M79609 Pain in unspecified limb: Secondary | ICD-10-CM | POA: Insufficient documentation

## 2012-08-24 DIAGNOSIS — I10 Essential (primary) hypertension: Secondary | ICD-10-CM | POA: Insufficient documentation

## 2012-08-24 DIAGNOSIS — I252 Old myocardial infarction: Secondary | ICD-10-CM | POA: Insufficient documentation

## 2012-08-24 DIAGNOSIS — M7989 Other specified soft tissue disorders: Secondary | ICD-10-CM | POA: Diagnosis not present

## 2012-08-24 DIAGNOSIS — A4902 Methicillin resistant Staphylococcus aureus infection, unspecified site: Secondary | ICD-10-CM | POA: Insufficient documentation

## 2012-08-24 DIAGNOSIS — I70219 Atherosclerosis of native arteries of extremities with intermittent claudication, unspecified extremity: Secondary | ICD-10-CM | POA: Insufficient documentation

## 2012-08-24 DIAGNOSIS — I2510 Atherosclerotic heart disease of native coronary artery without angina pectoris: Secondary | ICD-10-CM | POA: Insufficient documentation

## 2012-08-24 DIAGNOSIS — L03119 Cellulitis of unspecified part of limb: Secondary | ICD-10-CM | POA: Diagnosis not present

## 2012-08-24 DIAGNOSIS — L02419 Cutaneous abscess of limb, unspecified: Secondary | ICD-10-CM | POA: Insufficient documentation

## 2012-08-24 HISTORY — DX: Cellulitis, unspecified: L03.90

## 2012-08-24 LAB — CBC WITH DIFFERENTIAL/PLATELET
Basophils Absolute: 0 10*3/uL (ref 0.0–0.1)
Basophils Relative: 0 % (ref 0–1)
Eosinophils Absolute: 0.3 10*3/uL (ref 0.0–0.7)
Eosinophils Relative: 3 % (ref 0–5)
Lymphocytes Relative: 14 % (ref 12–46)
MCHC: 33.6 g/dL (ref 30.0–36.0)
MCV: 88.1 fL (ref 78.0–100.0)
Monocytes Absolute: 0.8 10*3/uL (ref 0.1–1.0)
Platelets: 187 10*3/uL (ref 150–400)
RDW: 16.7 % — ABNORMAL HIGH (ref 11.5–15.5)
WBC: 8.4 10*3/uL (ref 4.0–10.5)

## 2012-08-24 LAB — GLUCOSE, CAPILLARY: Glucose-Capillary: 128 mg/dL — ABNORMAL HIGH (ref 70–99)

## 2012-08-24 LAB — BASIC METABOLIC PANEL
CO2: 25 mEq/L (ref 19–32)
Calcium: 9.2 mg/dL (ref 8.4–10.5)
Creatinine, Ser: 1.33 mg/dL — ABNORMAL HIGH (ref 0.50–1.10)
GFR calc Af Amer: 47 mL/min — ABNORMAL LOW (ref >90)
GFR calc non Af Amer: 40 mL/min — ABNORMAL LOW (ref >90)
Sodium: 132 mEq/L — ABNORMAL LOW (ref 135–145)

## 2012-08-24 MED ORDER — ISOSORBIDE MONONITRATE ER 30 MG PO TB24
30.0000 mg | ORAL_TABLET | Freq: Every day | ORAL | Status: DC
  Filled 2012-08-24 (×2): qty 1

## 2012-08-24 MED ORDER — VANCOMYCIN HCL IN DEXTROSE 1-5 GM/200ML-% IV SOLN: 1000.0000 mg | Freq: Once | INTRAVENOUS | Status: DC

## 2012-08-24 MED ORDER — LEVOTHYROXINE SODIUM 125 MCG PO TABS
125.0000 ug | ORAL_TABLET | Freq: Every day | ORAL | Status: DC
  Administered 2012-08-25: 125 ug via ORAL
  Filled 2012-08-24 (×3): qty 1

## 2012-08-24 MED ORDER — SODIUM CHLORIDE 0.9 % IV SOLN
1000.0000 mL | INTRAVENOUS | Status: DC
  Administered 2012-08-25: 1000 mL via INTRAVENOUS

## 2012-08-24 MED ORDER — INSULIN GLARGINE 100 UNIT/ML ~~LOC~~ SOLN
10.0000 [IU] | Freq: Two times a day (BID) | SUBCUTANEOUS | Status: DC
  Administered 2012-08-24: 10 [IU] via SUBCUTANEOUS

## 2012-08-24 MED ORDER — SODIUM CHLORIDE 0.9 % IV SOLN
1000.0000 mL | Freq: Once | INTRAVENOUS | Status: AC
  Administered 2012-08-24: 1000 mL via INTRAVENOUS

## 2012-08-24 MED ORDER — METOPROLOL SUCCINATE ER 25 MG PO TB24
25.0000 mg | ORAL_TABLET | Freq: Every day | ORAL | Status: DC
  Filled 2012-08-24 (×2): qty 1

## 2012-08-24 MED ORDER — VANCOMYCIN HCL IN DEXTROSE 1-5 GM/200ML-% IV SOLN
1000.0000 mg | Freq: Two times a day (BID) | INTRAVENOUS | Status: DC
  Administered 2012-08-24 – 2012-08-25 (×2): 1000 mg via INTRAVENOUS
  Filled 2012-08-24 (×2): qty 200

## 2012-08-24 MED ORDER — FUROSEMIDE 20 MG PO TABS: 40.0000 mg | ORAL_TABLET | Freq: Every day | ORAL | Status: DC

## 2012-08-24 NOTE — ED Notes (Signed)
This past Thursday, pt had a virus with fever n/v/d lasted approximately 24 hours.

## 2012-08-24 NOTE — ED Notes (Signed)
Pt. CBG was 128

## 2012-08-24 NOTE — ED Provider Notes (Signed)
History     CSN: [id number]  Arrival date & time 08/24/12  1640   First MD Initiated Contact with Patient 08/24/12 1739      Chief Complaint  Patient presents with  . Leg Pain    (Consider location/radiation/quality/duration/timing/severity/associated sxs/prior treatment) HPI Comments: 66 year old female with a history of type 2 diabetes, hypertension, CAD, PVD and MRSA presents emergency department with chief complaint of left lower leg erythema, swelling, and pain. Severity of pain is rated at 3/10 and described more as a stinging sensation and she denies radiation. Onset of symptoms began yesterday and patient states that this is a similar presentation to the last time she had cellulitis.  Patient denies having HIV or steroid use, trauma to the extremity, claudication, fever, night sweats, or chills.   Patient is a 66 y.o. female presenting with leg pain. The history is provided by the patient and medical records.  Leg Pain  Pertinent negatives include no numbness.    Past Medical History  Diagnosis Date  . HTN (hypertension)   . Hypothyroidism   . Diabetes mellitus, type 2     insulin dependent  . History of recurrent UTIs   . CAD (coronary artery disease)     s/p CABG x3 in 1993; last cath in 2010  . Scleroderma   . Bilateral carpal tunnel syndrome 1970s  . 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 Followed by [name]  . Arthritis   . Anemia   . Claudication   . Aortic stenosis     0.8cm2 on echo in 10/2011  . Hyperlipidemia     on Lipitor  . Myocardial infarction 1986  . Seizure     ? seizure like event in 2010; workup included stress testing which led to repeat cath.   . Cat bite 2009    with MRSA; required I & D  . Heart murmur   . Shortness of breath   . Cancer     breast cancer  . Blood transfusion     no reaction from transfusion  . GERD (gastroesophageal reflux disease)   . CHF  (congestive heart failure) Aug. 2012  . B12 deficiency anemia Aug. 2013    B 12 injection  . Cellulitis     Past Surgical History  Procedure Date  . Carpal tunnel release   . Mastectomy 11/1982    Left Breast  . Vitrectomy   . Tubal ligation   . Tonsillectomy   . Coronary artery bypass graft 1993    LIMA to LAD, SVG to distal LCX & SVG to Marginal  . Appendectomy   . Amputation     left first and second toes  . Cardiac catheterization 2010    LIMA to LAD patent yet with occluded distal LAD after graft insertion & retrograde is occluded. 3-4 septal perforators arise &are patent. SVG to a large marginal patent; SVG presumably to the distal dominant LCX is occluded, moderately high grade stenosis of the AV circumflex, but with distal stenoses involving a severely diseased marginal branch not amenable  to PCI  nor is inferior branch   . Iron infusion Aug. 30, 2013    Family History  Problem Relation Age of Onset  . Diabetes Mother     History  Substance Use Topics  . Smoking status: Never Smoker   . Smokeless tobacco: Never Used  . Alcohol Use: No  OB History    Grav Para Term Preterm Abortions TAB SAB Ect Mult Living                  Review of Systems  Constitutional: Negative for fever, chills and appetite change.  HENT: Negative for congestion.   Eyes: Negative for visual disturbance.  Respiratory: Negative for shortness of breath.   Cardiovascular: Negative for chest pain and leg swelling.  Gastrointestinal: Negative for abdominal pain.  Genitourinary: Negative for dysuria, urgency and frequency.  Skin: Positive for color change. Negative for wound.  Neurological: Negative for dizziness, syncope, weakness, light-headedness, numbness and headaches.  Psychiatric/Behavioral: Negative for confusion.  All other systems reviewed and are negative.    Allergies  Adhesive; Cephalexin; and Ciprofloxacin  Home Medications   Current Outpatient Rx  Name  Route  Sig   Dispense  Refill  . ASPIRIN EC 325 MG PO TBEC   Oral   Take 325 mg by mouth daily.           . ATORVASTATIN CALCIUM 40 MG PO TABS   Oral   Take 1 tablet (40 mg total) by mouth at bedtime.   30 tablet   11     Dispense as written.   [name] BRIMONIDINE TARTRATE 0.2 % OP SOLN   Both Eyes   Place 1 drop into both eyes 2 (two) times daily.           [name] DIPHENHYDRAMINE HCL 25 MG PO CAPS   Oral   Take 25 mg by mouth every 6 (six) hours as needed. For cold symptoms         . DOCUSATE SODIUM 50 MG PO CAPS   Oral   Take by mouth 2 (two) times daily.         [name] EZETIMIBE 10 MG PO TABS   Oral   Take 1 tablet (10 mg total) by mouth daily.   30 tablet   6   . FERROUS SULFATE ER 50 MG PO TBCR   Oral   Take 50 mg by mouth daily.          . FUROSEMIDE 20 MG PO TABS   Oral   Take 40-60 mg by mouth daily. 60 mg on Tues and Sat, 40 mg on all other days         . INSULIN GLARGINE 100 UNIT/ML [place] SOLN   Subcutaneous   Inject 10-20 Units into the skin 2 (two) times daily. 20 units in the morning and 10 units in the evening         . INSULIN LISPRO (HUMAN) 100 UNIT/ML [place] SOLN   Subcutaneous   Inject 0-9 Units into the skin 4 (four) times daily. Per sliding scale DO NOT GIVE WITHOUT CONSULTING PATIENT ON HER SLIDING SCALE         . ISOSORBIDE MONONITRATE ER 30 MG PO TB24   Oral   Take 30 mg by mouth daily.         [name] LATANOPROST 0.005 % OP SOLN   Both Eyes   Place 1 drop into both eyes at bedtime.           [name] LEVOTHYROXINE SODIUM 125 MCG PO TABS   Oral   Take 125 mcg by mouth daily.          [name] METOPROLOL SUCCINATE ER 25 MG PO TB24   Oral   Take 1 tablet (25 mg total) by mouth daily.   30 tablet  11   . NITROGLYCERIN 0.4 MG SL SUBL   Sublingual   Place 0.4 mg under the tongue every 5 (five) minutes as needed. For chest pain         . VITAMIN B-12 IJ   Injection   Inject as directed. monthly           BP 127/38  Pulse 59  Temp 98.8 F (37.1 C)  (Oral)  Resp 16  SpO2 98%  Physical Exam  Nursing note and vitals reviewed. Constitutional: She is oriented to person, place, and time. She appears well-developed and well-nourished. No distress.  HENT:  Head: Normocephalic and atraumatic.  Mouth/Throat: Oropharynx is clear and moist. No oropharyngeal exudate.  Eyes: Conjunctivae normal and EOM are normal. Pupils are equal, round, and reactive to light. No scleral icterus.  Neck: Normal range of motion. Neck supple. No tracheal deviation present. No thyromegaly present.  Cardiovascular: Normal rate, regular rhythm and normal heart sounds.        Intact distal pulses  Pulmonary/Chest: Effort normal and breath sounds normal. No stridor. No respiratory distress. She has no wheezes.  Abdominal: Soft.  Musculoskeletal: Normal range of motion.       LLE ttp, below ankle amputation   Neurological: She is alert and oriented to person, place, and time. Coordination normal.  Skin: Skin is warm and dry. No rash noted. She is not diaphoretic. There is erythema. No pallor.       LLE w warmth, erythema and edema.   Psychiatric: She has a normal mood and affect. Her behavior is normal.    ED Course  Procedures (including critical care time)   Labs Reviewed  CBC WITH DIFFERENTIAL  BASIC METABOLIC PANEL   No results found.   No diagnosis found.  BP 175/81  Pulse 65  Temp 98.9 F (37.2 C) (Oral)  Resp 20  SpO2 99%   MDM  And 66 year old female with a history of MRSA, diabetes, PVD and CHF presents emergency department with left lower extremity cellulitis. Duplex study was negative for DVT.  Patient has been placed on a cellulitic protocol and has been given one dose of vancomycin at 6 o'clock.  Recommend a second dose at 6 a.m.  Order placed in computer.  Reevaluation in morning.  Anticipate discharged with antibiotics.  Patient is hemodynamically stable and in no acute distress throughout her time in the CDU.Patient care to be resumed by  Dr. Bernette Mayers. Patient history has been discussed with physician resuming care. Patient is in the CDU for observation and has 1 am dose of abx pending.         Jaci Carrel, PA-C 08/24/12 2244  Jaci Carrel, PA-C 08/24/12 2248

## 2012-08-24 NOTE — ED Provider Notes (Signed)
History     CSN: [id number]  Arrival date & time 08/24/12  1640   First MD Initiated Contact with Patient 08/24/12 1739      Chief Complaint  Patient presents with  . Leg Pain    (Consider location/radiation/quality/duration/timing/severity/associated sxs/prior treatment) Patient is a 66 y.o. female presenting with leg pain.  Leg Pain     Past Medical History  Diagnosis Date  . HTN (hypertension)   . Hypothyroidism   . Diabetes mellitus, type 2     insulin dependent  . History of recurrent UTIs   . CAD (coronary artery disease)     s/p CABG x3 in 1993; last cath in 2010  . Scleroderma   . Bilateral carpal tunnel syndrome 1970s  . 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 Followed by [name]  . Arthritis   . Anemia   . Claudication   . Aortic stenosis     0.8cm2 on echo in 10/2011  . Hyperlipidemia     on Lipitor  . Myocardial infarction 1986  . Seizure     ? seizure like event in 2010; workup included stress testing which led to repeat cath.   . Cat bite 2009    with MRSA; required I & D  . Heart murmur   . Shortness of breath   . Cancer     breast cancer  . Blood transfusion     no reaction from transfusion  . GERD (gastroesophageal reflux disease)   . CHF (congestive heart failure) Aug. 2012  . B12 deficiency anemia Aug. 2013    B 12 injection  . Cellulitis     Past Surgical History  Procedure Date  . Carpal tunnel release   . Mastectomy 11/1982    Left Breast  . Vitrectomy   . Tubal ligation   . Tonsillectomy   . Coronary artery bypass graft 1993    LIMA to LAD, SVG to distal LCX & SVG to Marginal  . Appendectomy   . Amputation     left first and second toes  . Cardiac catheterization 2010    LIMA to LAD patent yet with occluded distal LAD after graft insertion & retrograde is occluded. 3-4 septal perforators arise &are patent. SVG to a large marginal patent; SVG presumably to  the distal dominant LCX is occluded, moderately high grade stenosis of the AV circumflex, but with distal stenoses involving a severely diseased marginal branch not amenable  to PCI  nor is inferior branch   . Iron infusion Aug. 30, 2013    Family History  Problem Relation Age of Onset  . Diabetes Mother     History  Substance Use Topics  . Smoking status: Never Smoker   . Smokeless tobacco: Never Used  . Alcohol Use: No    OB History    Grav Para Term Preterm Abortions TAB SAB Ect Mult Living                  Review of Systems  Allergies  Adhesive; Cephalexin; and Ciprofloxacin  Home Medications   Current Outpatient Rx  Name  Route  Sig  Dispense  Refill  . ASPIRIN EC 325 MG PO TBEC   Oral   Take 325 mg by mouth daily.           . ATORVASTATIN CALCIUM 40 MG PO TABS   Oral   Take 1 tablet (  40 mg total) by mouth at bedtime.   30 tablet   11     Dispense as written.   [name] BRIMONIDINE TARTRATE 0.2 % OP SOLN   Both Eyes   Place 1 drop into both eyes 2 (two) times daily.           [name] DIPHENHYDRAMINE HCL 25 MG PO CAPS   Oral   Take 25 mg by mouth every 6 (six) hours as needed. For cold symptoms         . DOCUSATE SODIUM 50 MG PO CAPS   Oral   Take by mouth 2 (two) times daily.         [name] EZETIMIBE 10 MG PO TABS   Oral   Take 1 tablet (10 mg total) by mouth daily.   30 tablet   6   . FERROUS SULFATE ER 50 MG PO TBCR   Oral   Take 50 mg by mouth daily.          . FUROSEMIDE 20 MG PO TABS   Oral   Take 40-60 mg by mouth daily. 60 mg on Tues and Sat, 40 mg on all other days         . INSULIN GLARGINE 100 UNIT/ML [place] SOLN   Subcutaneous   Inject 10-20 Units into the skin 2 (two) times daily. 20 units in the morning and 10 units in the evening         . INSULIN LISPRO (HUMAN) 100 UNIT/ML [place] SOLN   Subcutaneous   Inject 0-9 Units into the skin 4 (four) times daily. Per sliding scale DO NOT GIVE WITHOUT CONSULTING PATIENT ON HER SLIDING SCALE           . ISOSORBIDE MONONITRATE ER 30 MG PO TB24   Oral   Take 30 mg by mouth daily.         [name] LATANOPROST 0.005 % OP SOLN   Both Eyes   Place 1 drop into both eyes at bedtime.           [name] LEVOTHYROXINE SODIUM 125 MCG PO TABS   Oral   Take 125 mcg by mouth daily.          [name] METOPROLOL SUCCINATE ER 25 MG PO TB24   Oral   Take 1 tablet (25 mg total) by mouth daily.   30 tablet   11   . NITROGLYCERIN 0.4 MG SL SUBL   Sublingual   Place 0.4 mg under the tongue every 5 (five) minutes as needed. For chest pain         . VITAMIN B-12 IJ   Injection   Inject as directed. monthly           BP 127/38  Pulse 59  Temp 98.8 F (37.1 C) (Oral)  Resp 16  SpO2 98%  Physical Exam  Constitutional: She appears well-developed and well-nourished. No distress.  HENT:  Head: Normocephalic and atraumatic.  Skin: Skin is warm and dry. There is erythema.       ED Course  Procedures (including critical care time)  Labs Reviewed  CBC WITH DIFFERENTIAL - Abnormal; Notable for the following:    RBC 3.85 (*)     Hemoglobin 11.4 (*)     HCT 33.9 (*)     RDW 16.7 (*)     All other components within normal limits  BASIC METABOLIC PANEL - Abnormal; Notable for the following:    Sodium 132 (*)  Glucose, Bld 207 (*)     BUN 40 (*)     Creatinine, Ser 1.33 (*)     GFR calc non Af Amer 40 (*)     GFR calc Af Amer 47 (*)     All other components within normal limits   No results found.   No diagnosis found.    MDM   Patient is here due to redness and swelling of the left lower leg that started yesterday. She denies any fever and has normal vital signs.  Duplex study was negative for DVT. Patient will be placed on the cellulitis protocol and will receive vancomycin.        Gwyneth Sprout, MD 08/24/12 2009

## 2012-08-24 NOTE — ED Notes (Signed)
Lt. Lower leg cellulits, redness, warm and pain,

## 2012-08-24 NOTE — ED Notes (Signed)
 CC: Follow up diabetes  HPI:  Mr.[name] is a 66 y.o. female living with a history stated below and presents today for follow up of his diabetes. Please see problem based assessment and plan for additional details.  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 5/9 - 02/28/2022 H/H 6.9/20.9.  Imaging revealed large retroperitoneal hematoma.  S/P 2 units packed cells H/H stabilized with final values of 11.3/36.4.   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 (Patient not taking: Reported on 05/21/2022) 90 tablet 2   aspirin EC 81 MG tablet Take 1 tablet (81 mg total) by mouth daily. Swallow whole. (Patient not taking: Reported on 04/12/2022) 30 tablet 0   blood glucose meter kit and supplies KIT Dispense based on patient and insurance preference. Use up to four times daily as directed. (FOR ICD-9 250.00, 250.01). 1 each 0   Blood Glucose Monitoring Suppl (BLOOD GLUCOSE MONITOR SYSTEM) w/Device KIT Use to check blood sugar 3 (three) times daily. 1 kit 0   carvedilol (COREG) 6.25 MG tablet Take 1 tablet (6.25 mg total) by mouth 2 (two)  times daily with meals for hypertension 60 tablet 0   Continuous Blood Gluc Sensor (FREESTYLE LIBRE 2 SENSOR) MISC Place 1 sensor on the skin every 14 days. Use to check glucose continuously 2 each 3   Continuous Blood Gluc Sensor (FREESTYLE LIBRE 2 SENSOR) MISC Use as directed every 14 days 2 each 0   Darbepoetin Alfa (ARANESP) 100 MCG/0.5ML SOSY injection Inject 0.5 mLs (100 mcg total) into the vein every Wednesday with hemodialysis. 4.2 mL    DULoxetine (CYMBALTA) 30 MG capsule Take 2 capsules (60 mg total) by mouth daily. 180 capsule 1   ezetimibe (ZETIA) 10 MG tablet Take 1 tablet (10 mg total) by mouth daily. 30 tablet 0   fluticasone (FLONASE) 50 MCG/ACT nasal spray Place 1 spray into both nostrils daily. (Patient not taking: Reported on 05/21/2022) 16 g 0   glucose blood (ACCU-CHEK AVIVA PLUS) test strip Check blood sugar 3 times a day 100 each 12   Insulin Pen Needle 32G X 4 MM MISC Use to inject insulin 4 (four) times daily. 360 each 3   isosorbide mononitrate (IMDUR) 30 MG 24 hr tablet Take 0.5 tablets (15 mg total) by mouth daily for ischemic cardiomyopathy 15 tablet 0   melatonin (KP MELATONIN) 3 MG TABS tablet Take 1 tablet (3 mg total) by mouth at bedtime for insomnia. 30 tablet 0   oxyCODONE (OXY IR/ROXICODONE) 5 MG   immediate release tablet Take 1 tablet (5 mg total) by mouth every 6 (six) hours as needed for pain 120 tablet 0   pantoprazole (PROTONIX) 40 MG tablet Take 1 tablet by mouth once a day 30 tablet 11   polyethylene glycol powder (GLYCOLAX/MIRALAX) 17 GM/SCOOP powder Take 17 g by mouth daily. 510 g 0   rosuvastatin (CRESTOR) 10 MG tablet Take 1 tablet (10 mg total) by mouth daily. 90 tablet 2   senna-docusate (SENEXON-S) 8.6-50 MG tablet Take 1 tablet by mouth at bedtime for constipation 30 tablet 0   No current facility-administered medications on file prior to visit.    Family History  Problem Relation Age of Onset   Diabetes Mother    Hypertension Brother     Hypertension Sister    Anesthesia problems Neg Hx    Colon cancer Neg Hx    Rectal cancer Neg Hx    Stomach cancer Neg Hx     Social History   Socioeconomic History   Marital status: Single    Spouse name: Not on file   Number of children: 3   Years of education: 11th   Highest education level: Not on file  Occupational History    Employer: [employer]  Tobacco Use   Smoking status: Every Day    Years: 24.00    Types: Cigarettes    Passive exposure: Never   Smokeless tobacco: Never   Tobacco comments:    STARTED BACK SMOKING 2017. 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, makes chair parts)   Graduated from [employer]; No further school because he had a baby girl   He has 4 children  (17, 22 , 27, 30 as of 2013)   Social Determinants of Health   Financial Resource Strain: Not on file  Food Insecurity: No Food Insecurity (07/02/2022)   Hunger Vital Sign    Worried About Running Out of Food in the Last Year: Never true    Ran Out of Food in the Last Year: Never true  Transportation Needs: No Transportation Needs (07/02/2022)   PRAPARE - Transportation    Lack of Transportation (Medical): No    Lack of Transportation (Non-Medical): No  Physical Activity: Not on file  Stress: Not on file  Social Connections: Not on file  Intimate Partner Violence: Not on file    Review of Systems: ROS negative except for what is noted on the assessment and plan.  Vitals:   12/12/22 1458  BP: (!) 164/89  Pulse: 88  Temp: 98.1 F (36.7 C)  TempSrc: Oral  Weight: 157 lb 9.6 oz (71.5 kg)  Height: 5' 8" (1.727 m)     Physical Exam: Constitutional: no acute distress HENT: normocephalic atraumatic Eyes: conjunctiva non-erythematous Neck: supple Cardiovascular: regular rate and rhythm, no m/r/g Pulmonary/Chest: normal work of breathing on room air, lungs clear to auscultation bilaterally MSK: normal bulk and tone. Right AKA and left BKA no wounds or sores on residual limbs Neurological: alert & oriented x 3 Skin: warm and dry. Right IJ central line intact.   No erythema or soreness. Area is not covered with dressing Psych: flat affect  Assessment & Plan:   Hypertension, essential Assessment: Current regimen of amlodipine 10 mg and carvedilol 6.125 mg BID. He denies taking his blood pressure medications today. Nephrology team to continue managing his BP. Encouraged him to follow up with them.   Plan: - continue amlodipine 10 mg and carvedilol 6.125 mg BID - follow up further management per nephrology  Type 2 diabetes mellitus with diabetic peripheral angiopathy and gangrene, with long-term current use of insulin (HCC) Assessment: Current regimen of lantus 15 U QHS and novolog 5 U TID with meals. He infrequently checks his glucose levels and has missed a few of his insulin doses. A1c today 8.7% from 8.2%. We discussed importance of adhering to insulin regimen and checking glucose levels regularly. He endorses difficulties remember to take his insulin. If continues to have increase in A1c and poor adherence to monitoring his insulin could decrease insulin and add weekly GLP-1i.   Plan: - continue lantus 15 U QHS and novolog 5 U TID - encourage adherence to regimen and checking glucose levels regularly - needs eye exam, ophthalmology referral placed  PVD (peripheral vascular disease) Assessment: S/p left AKA and right BKA. He continues to have numbness/tingling in his fingers secondary to PAD. Best thing he can do is stop smoking which we discussed extensively today. He gets his left prosthetic  Friday which I believe will help with his ADL/iADL  Plan: - continue aspirin and rosuvastatin daily  ESRD on dialysis (HCC) Assessment: Hx of diabetic nephropathy. Misses some dialysis sessions due to difficulty with transportation. S/p failed fistula. His tunneled right IJ dialysis catheter was completely exposed today and not sutured in. He cannot remember the last time it was covered. Discussed bringing this up with renal navigator and asking if they are able to clean and cover the area at dialysis tomorrow.   Plan: - continue to work on dialysis adherence - follow up to make certain dialysis catheter is always covered and cleaned regularly. Hopeful he qualifies for medicaid and PCS  HLD (hyperlipidemia) Assessment: LDL at goal for secondary prevention  Plan: - continue rosuvastatin 10 mg daily  Tobacco use disorder Assessment: Extensive tobacco use disorder history. Discussed stopping today, he is interested in chantix as well as nicotine patches and gum. He has not had lung cancer CT screening. Last CT chest was in 2019 and showed pulmonary nodules. Does not appear these were ever followed up on.   Plan: - chantix (renally dosed .5 mg daily), nicotine patch and gum PRN - low con chest CT to follow up nodules  Pulmonary nodules Assessment: 2019 CT scan found to have mediastinal adenopathy. Reviewed by tumor board and PET scan performed. He was felt to be low risk for lung cancer. This does not see to have been followed since then. Will order low dose chest CT for lung cancer screening  Plan: - low dose CT chest  Patient discussed with Dr. Guilloud  Vasili Katsadouros, D.O. [facility], PGY-3 Phone: [phone] Date 12/13/2022 Time 10:29 AM  

## 2012-08-24 NOTE — ED Notes (Signed)
Paged IV team 

## 2012-08-24 NOTE — Progress Notes (Signed)
VASCULAR LAB PRELIMINARY  PRELIMINARY  PRELIMINARY  PRELIMINARY  Left lower extremity venous Doppler completed.    Preliminary report:  There is no DVT or SVT noted in the left lower extremity.  There are enlarged lymph nodes noted in the bilateral groins.  [name], 08/24/2012, 6:54 PM

## 2012-08-25 MED ORDER — INSULIN ASPART 100 UNIT/ML ~~LOC~~ SOLN: 2.0000 [IU] | Freq: Once | SUBCUTANEOUS | Status: DC

## 2012-08-25 MED ORDER — OXYCODONE-ACETAMINOPHEN 5-325 MG PO TABS: ORAL_TABLET | ORAL | Status: DC

## 2012-08-25 MED ORDER — INSULIN ASPART 100 UNIT/ML ~~LOC~~ SOLN: 2.0000 [IU] | Freq: Every day | SUBCUTANEOUS | Status: DC

## 2012-08-25 MED ORDER — ACETAMINOPHEN 325 MG PO TABS
650.0000 mg | ORAL_TABLET | Freq: Once | ORAL | Status: AC
  Administered 2012-08-25: 650 mg via ORAL
  Filled 2012-08-25: qty 2

## 2012-08-25 MED ORDER — CLINDAMYCIN HCL 150 MG PO CAPS: 450.0000 mg | ORAL_CAPSULE | Freq: Three times a day (TID) | ORAL | Status: DC

## 2012-08-25 MED ORDER — VANCOMYCIN HCL IN DEXTROSE 1-5 GM/200ML-% IV SOLN: 1000.0000 mg | Freq: Once | INTRAVENOUS | Status: DC

## 2012-08-25 NOTE — ED Provider Notes (Signed)
[name] is a 66 y.o. female transferred to CDU from POD A. Signout from Dr. Norlene Campbell as follows: Patient is diabetic with history of MI and claudication, MRSA with induration to right lower extremity. Patient has received 80 venous Doppler study showing no abnormalities. Patient received her first dose of vancomycin at 1845 last night. She is pending her second dose of vancomycin at 9 AM.  Plan is to discharge the patient on oral antibiotics with close followup after she received her second dose of vancomycin.  8:01 AM Pt seen and examined at the bedside states her fever broke several hours ago. Patient says pain is minimal at this time and she refuses analgesia. Patient's heart is regular rate and rhythm with no murmurs rubs or gallops, lung sounds are clear to auscultation bilaterally, abdominal exam is benign with no tenderness to palpation or rebound. There is significant erythema induration and swelling, warmth to left lower extremity extending up to the knee area.  Spoke to [employer] who states that patient has received second dose of vancomycin: it was being administered at shift change this a.m. She states that the dose has completed and although there was an issue with documentation of initiation of the second dose of vancomycin it has completed as ordered.  Outline of cellulitis will be made with surgical pen the patient will be discharged on clindamycin. Patient will have a recheck in the next 24-48 hours.  Results for orders placed during the hospital encounter of 08/24/12  CBC WITH DIFFERENTIAL      Component Value Range   WBC 8.4  4.0 - 10.5 K/uL   RBC 3.85 (*) 3.87 - 5.11 MIL/uL   Hemoglobin 11.4 (*) 12.0 - 15.0 g/dL   HCT 81.1 (*) 91.4 - 78.2 %   MCV 88.1  78.0 - 100.0 fL   MCH 29.6  26.0 - 34.0 pg   MCHC 33.6  30.0 - 36.0 g/dL   RDW 95.6 (*) 21.3 - 08.6 %   Platelets 187  150 - 400 K/uL   Neutrophils Relative 74  43 - 77 %   Neutro Abs 6.2  1.7 - 7.7 K/uL   Lymphocytes Relative 14  12 - 46 %   Lymphs Abs 1.2  0.7 - 4.0 K/uL   Monocytes Relative 9  3 - 12 %   Monocytes Absolute 0.8  0.1 - 1.0 K/uL   Eosinophils Relative 3  0 - 5 %   Eosinophils Absolute 0.3  0.0 - 0.7 K/uL   Basophils Relative 0  0 - 1 %   Basophils Absolute 0.0  0.0 - 0.1 K/uL  BASIC METABOLIC PANEL      Component Value Range   Sodium 132 (*) 135 - 145 mEq/L   Potassium 4.7  3.5 - 5.1 mEq/L   Chloride 98  96 - 112 mEq/L   CO2 25  19 - 32 mEq/L   Glucose, Bld 207 (*) 70 - 99 mg/dL   BUN 40 (*) 6 - 23 mg/dL   Creatinine, Ser 5.78 (*) 0.50 - 1.10 mg/dL   Calcium 9.2  8.4 - 46.9 mg/dL   GFR calc non Af Amer 40 (*) >90 mL/min   GFR calc Af Amer 47 (*) >90 mL/min  GLUCOSE, CAPILLARY      Component Value Range   Glucose-Capillary 128 (*) 70 - 99 mg/dL   Comment 1 Documented in Chart     Comment 2 Notify RN    GLUCOSE, CAPILLARY  Component Value Range   Glucose-Capillary 195 (*) 70 - 99 mg/dL   Comment 1 Documented in Chart     Comment 2 Notify RN     No results found.    Pt verbalized understanding and agrees with care plan. Outpatient follow-up and return precautions given.    New Prescriptions   CLINDAMYCIN (CLEOCIN) 150 MG CAPSULE    Take 3 capsules (450 mg total) by mouth 3 (three) times daily.   OXYCODONE-ACETAMINOPHEN (PERCOCET/ROXICET) 5-325 MG PER TABLET    1 to 2 tabs PO q6hrs  PRN for pain    Wynetta Emery, PA-C 08/25/12 [phone]

## 2012-08-26 NOTE — ED Provider Notes (Signed)
Medical screening examination/treatment/procedure(s) were performed by non-physician practitioner and as supervising physician I was immediately available for consultation/collaboration.   Shelda Jakes, MD 08/26/12 [phone]

## 2012-08-26 NOTE — ED Provider Notes (Signed)
Medical screening examination/treatment/procedure(s) were conducted as a shared visit with non-physician practitioner(s) and myself.  I personally evaluated the patient during the encounter   Gwyneth Sprout, MD 08/26/12 1910

## 2012-08-27 ENCOUNTER — Telehealth: Payer: Self-pay | Admitting: *Deleted

## 2012-08-27 ENCOUNTER — Ambulatory Visit: Payer: Medicare Other

## 2012-08-27 NOTE — Telephone Encounter (Signed)
Patient called to cancel appointment.  States that she had been in the hospital with cellulitis and was needed to go back.  She said that she would call back next week, when she knews her schedule, to reschedule.

## 2012-08-27 NOTE — Telephone Encounter (Signed)
ok 

## 2012-08-28 ENCOUNTER — Encounter (HOSPITAL_COMMUNITY): Payer: Self-pay

## 2012-08-28 ENCOUNTER — Inpatient Hospital Stay (HOSPITAL_COMMUNITY)
Admission: EM | Admit: 2012-08-28 | Discharge: 2012-08-30 | DRG: 603 | Disposition: A | Discharge status: Home / Self Care | Payer: Medicare Other | Attending: Internal Medicine | Admitting: Internal Medicine

## 2012-08-28 DIAGNOSIS — I252 Old myocardial infarction: Secondary | ICD-10-CM

## 2012-08-28 DIAGNOSIS — I5042 Chronic combined systolic (congestive) and diastolic (congestive) heart failure: Secondary | ICD-10-CM | POA: Diagnosis not present

## 2012-08-28 DIAGNOSIS — K219 Gastro-esophageal reflux disease without esophagitis: Secondary | ICD-10-CM | POA: Diagnosis not present

## 2012-08-28 DIAGNOSIS — L02419 Cutaneous abscess of limb, unspecified: Principal | ICD-10-CM | POA: Diagnosis present

## 2012-08-28 DIAGNOSIS — I350 Nonrheumatic aortic (valve) stenosis: Secondary | ICD-10-CM

## 2012-08-28 DIAGNOSIS — S98119A Complete traumatic amputation of unspecified great toe, initial encounter: Secondary | ICD-10-CM

## 2012-08-28 DIAGNOSIS — E785 Hyperlipidemia, unspecified: Secondary | ICD-10-CM

## 2012-08-28 DIAGNOSIS — Z79899 Other long term (current) drug therapy: Secondary | ICD-10-CM

## 2012-08-28 DIAGNOSIS — I2510 Atherosclerotic heart disease of native coronary artery without angina pectoris: Secondary | ICD-10-CM | POA: Diagnosis present

## 2012-08-28 DIAGNOSIS — L0390 Cellulitis, unspecified: Secondary | ICD-10-CM | POA: Diagnosis not present

## 2012-08-28 DIAGNOSIS — E119 Type 2 diabetes mellitus without complications: Secondary | ICD-10-CM

## 2012-08-28 DIAGNOSIS — Z853 Personal history of malignant neoplasm of breast: Secondary | ICD-10-CM | POA: Diagnosis not present

## 2012-08-28 DIAGNOSIS — Z951 Presence of aortocoronary bypass graft: Secondary | ICD-10-CM | POA: Diagnosis not present

## 2012-08-28 DIAGNOSIS — I509 Heart failure, unspecified: Secondary | ICD-10-CM | POA: Diagnosis not present

## 2012-08-28 DIAGNOSIS — S98919A Complete traumatic amputation of unspecified foot, level unspecified, initial encounter: Secondary | ICD-10-CM | POA: Diagnosis not present

## 2012-08-28 DIAGNOSIS — R739 Hyperglycemia, unspecified: Secondary | ICD-10-CM

## 2012-08-28 DIAGNOSIS — E109 Type 1 diabetes mellitus without complications: Secondary | ICD-10-CM | POA: Diagnosis present

## 2012-08-28 DIAGNOSIS — I739 Peripheral vascular disease, unspecified: Secondary | ICD-10-CM | POA: Diagnosis present

## 2012-08-28 DIAGNOSIS — Z794 Long term (current) use of insulin: Secondary | ICD-10-CM

## 2012-08-28 DIAGNOSIS — Z87440 Personal history of urinary (tract) infections: Secondary | ICD-10-CM | POA: Diagnosis not present

## 2012-08-28 DIAGNOSIS — Z9089 Acquired absence of other organs: Secondary | ICD-10-CM

## 2012-08-28 DIAGNOSIS — I10 Essential (primary) hypertension: Secondary | ICD-10-CM | POA: Diagnosis not present

## 2012-08-28 DIAGNOSIS — Z9010 Acquired absence of unspecified breast and nipple: Secondary | ICD-10-CM

## 2012-08-28 DIAGNOSIS — E039 Hypothyroidism, unspecified: Secondary | ICD-10-CM | POA: Diagnosis present

## 2012-08-28 DIAGNOSIS — Z792 Long term (current) use of antibiotics: Secondary | ICD-10-CM | POA: Diagnosis not present

## 2012-08-28 DIAGNOSIS — S98139A Complete traumatic amputation of one unspecified lesser toe, initial encounter: Secondary | ICD-10-CM | POA: Diagnosis not present

## 2012-08-28 DIAGNOSIS — L0291 Cutaneous abscess, unspecified: Secondary | ICD-10-CM

## 2012-08-28 DIAGNOSIS — Z23 Encounter for immunization: Secondary | ICD-10-CM | POA: Diagnosis not present

## 2012-08-28 DIAGNOSIS — I359 Nonrheumatic aortic valve disorder, unspecified: Secondary | ICD-10-CM | POA: Diagnosis present

## 2012-08-28 DIAGNOSIS — L03119 Cellulitis of unspecified part of limb: Secondary | ICD-10-CM | POA: Diagnosis present

## 2012-08-28 DIAGNOSIS — L03116 Cellulitis of left lower limb: Secondary | ICD-10-CM

## 2012-08-28 DIAGNOSIS — M349 Systemic sclerosis, unspecified: Secondary | ICD-10-CM | POA: Diagnosis present

## 2012-08-28 DIAGNOSIS — M129 Arthropathy, unspecified: Secondary | ICD-10-CM | POA: Diagnosis present

## 2012-08-28 DIAGNOSIS — Z8614 Personal history of Methicillin resistant Staphylococcus aureus infection: Secondary | ICD-10-CM

## 2012-08-28 DIAGNOSIS — M79609 Pain in unspecified limb: Secondary | ICD-10-CM | POA: Diagnosis not present

## 2012-08-28 HISTORY — DX: Dyspnea, unspecified: R06.00

## 2012-08-28 HISTORY — DX: Primary osteoarthritis, left ankle and foot: M19.072

## 2012-08-28 HISTORY — DX: Personal history of other diseases of the respiratory system: Z87.09

## 2012-08-28 HISTORY — DX: Primary osteoarthritis, right ankle and foot: M19.071

## 2012-08-28 HISTORY — DX: Angina pectoris, unspecified: I20.9

## 2012-08-28 HISTORY — DX: Malignant neoplasm of unspecified site of unspecified female breast: C50.919

## 2012-08-28 HISTORY — DX: Other forms of dyspnea: R06.09

## 2012-08-28 HISTORY — DX: Iron deficiency anemia, unspecified: D50.9

## 2012-08-28 HISTORY — DX: Gastric ulcer, unspecified as acute or chronic, without hemorrhage or perforation: K25.9

## 2012-08-28 HISTORY — DX: Type 1 diabetes mellitus without complications: E10.9

## 2012-08-28 LAB — CBC
Hemoglobin: 11.1 g/dL — ABNORMAL LOW (ref 12.0–15.0)
MCH: 29.4 pg (ref 26.0–34.0)
MCV: 87.5 fL (ref 78.0–100.0)
Platelets: 220 10*3/uL (ref 150–400)
RBC: 3.77 MIL/uL — ABNORMAL LOW (ref 3.87–5.11)
WBC: 8.1 10*3/uL (ref 4.0–10.5)

## 2012-08-28 LAB — BASIC METABOLIC PANEL
CO2: 25 mEq/L (ref 19–32)
Calcium: 9.4 mg/dL (ref 8.4–10.5)
Chloride: 101 mEq/L (ref 96–112)
Creatinine, Ser: 1.09 mg/dL (ref 0.50–1.10)
Glucose, Bld: 254 mg/dL — ABNORMAL HIGH (ref 70–99)

## 2012-08-28 LAB — MRSA PCR SCREENING: MRSA by PCR: POSITIVE — AB

## 2012-08-28 MED ORDER — VANCOMYCIN HCL 1000 MG IV SOLR
1250.0000 mg | INTRAVENOUS | Status: DC
  Administered 2012-08-29: 1250 mg via INTRAVENOUS
  Filled 2012-08-28 (×2): qty 1250

## 2012-08-28 MED ORDER — OXYCODONE-ACETAMINOPHEN 5-325 MG PO TABS: 1.0000 | ORAL_TABLET | ORAL | Status: DC | PRN

## 2012-08-28 MED ORDER — CLINDAMYCIN HCL 300 MG PO CAPS
450.0000 mg | ORAL_CAPSULE | Freq: Three times a day (TID) | ORAL | Status: DC
  Filled 2012-08-28: qty 1

## 2012-08-28 MED ORDER — METOPROLOL SUCCINATE ER 25 MG PO TB24
25.0000 mg | ORAL_TABLET | Freq: Every day | ORAL | Status: DC
  Administered 2012-08-29 – 2012-08-30 (×2): 25 mg via ORAL
  Filled 2012-08-28 (×2): qty 1

## 2012-08-28 MED ORDER — LEVOTHYROXINE SODIUM 125 MCG PO TABS
125.0000 ug | ORAL_TABLET | Freq: Every day | ORAL | Status: DC
  Administered 2012-08-29 – 2012-08-30 (×2): 125 ug via ORAL
  Filled 2012-08-28 (×4): qty 1

## 2012-08-28 MED ORDER — SODIUM CHLORIDE 0.9 % IV SOLN
INTRAVENOUS | Status: DC
  Administered 2012-08-28: 18:00:00 via INTRAVENOUS

## 2012-08-28 MED ORDER — FUROSEMIDE 40 MG PO TABS
40.0000 mg | ORAL_TABLET | Freq: Every day | ORAL | Status: DC
  Administered 2012-08-29: 40 mg via ORAL
  Administered 2012-08-30: 60 mg via ORAL
  Filled 2012-08-28 (×3): qty 1.5

## 2012-08-28 MED ORDER — MUPIROCIN 2 % EX OINT
1.0000 "application " | TOPICAL_OINTMENT | Freq: Two times a day (BID) | CUTANEOUS | Status: DC
  Administered 2012-08-29 – 2012-08-30 (×4): 1 via NASAL
  Filled 2012-08-28 (×2): qty 22

## 2012-08-28 MED ORDER — INSULIN ASPART 100 UNIT/ML ~~LOC~~ SOLN: 0.0000 [IU] | Freq: Every day | SUBCUTANEOUS | Status: DC

## 2012-08-28 MED ORDER — FERROUS SULFATE ER 50 MG PO TBCR: 50.0000 mg | EXTENDED_RELEASE_TABLET | Freq: Every day | ORAL | Status: DC

## 2012-08-28 MED ORDER — CYANOCOBALAMIN 1000 MCG/ML IJ SOLN
1000.0000 ug | INTRAMUSCULAR | Status: DC
  Administered 2012-08-29: 1000 ug via INTRAMUSCULAR
  Filled 2012-08-28 (×2): qty 1

## 2012-08-28 MED ORDER — INSULIN GLARGINE 100 UNIT/ML ~~LOC~~ SOLN: 20.0000 [IU] | Freq: Every day | SUBCUTANEOUS | Status: DC

## 2012-08-28 MED ORDER — INSULIN GLARGINE 100 UNIT/ML ~~LOC~~ SOLN
20.0000 [IU] | Freq: Every morning | SUBCUTANEOUS | Status: DC
  Administered 2012-08-29 – 2012-08-30 (×2): 20 [IU] via SUBCUTANEOUS

## 2012-08-28 MED ORDER — INSULIN ASPART 100 UNIT/ML ~~LOC~~ SOLN
0.0000 [IU] | Freq: Three times a day (TID) | SUBCUTANEOUS | Status: DC
  Administered 2012-08-28: 3 [IU] via SUBCUTANEOUS
  Administered 2012-08-29: 2 [IU] via SUBCUTANEOUS
  Administered 2012-08-29: 5 [IU] via SUBCUTANEOUS

## 2012-08-28 MED ORDER — INSULIN GLARGINE 100 UNIT/ML ~~LOC~~ SOLN: 10.0000 [IU] | Freq: Two times a day (BID) | SUBCUTANEOUS | Status: DC

## 2012-08-28 MED ORDER — BRIMONIDINE TARTRATE 0.2 % OP SOLN
1.0000 [drp] | Freq: Two times a day (BID) | OPHTHALMIC | Status: DC
  Administered 2012-08-28 – 2012-08-30 (×4): 1 [drp] via OPHTHALMIC
  Filled 2012-08-28 (×2): qty 5

## 2012-08-28 MED ORDER — INFLUENZA VIRUS VACC SPLIT PF IM SUSP
0.5000 mL | Freq: Once | INTRAMUSCULAR | Status: DC
  Filled 2012-08-28 (×2): qty 0.5

## 2012-08-28 MED ORDER — INSULIN GLARGINE 100 UNIT/ML ~~LOC~~ SOLN
10.0000 [IU] | Freq: Every day | SUBCUTANEOUS | Status: DC
  Administered 2012-08-28 – 2012-08-29 (×2): 10 [IU] via SUBCUTANEOUS

## 2012-08-28 MED ORDER — ATORVASTATIN CALCIUM 40 MG PO TABS
40.0000 mg | ORAL_TABLET | Freq: Every day | ORAL | Status: DC
  Administered 2012-08-28 – 2012-08-29 (×2): 40 mg via ORAL
  Filled 2012-08-28 (×4): qty 1

## 2012-08-28 MED ORDER — FERROUS SULFATE 325 (65 FE) MG PO TABS
325.0000 mg | ORAL_TABLET | Freq: Every day | ORAL | Status: DC
  Filled 2012-08-28 (×4): qty 1

## 2012-08-28 MED ORDER — SODIUM CHLORIDE 0.9 % IV SOLN
INTRAVENOUS | Status: DC
  Administered 2012-08-28 (×2): via INTRAVENOUS

## 2012-08-28 MED ORDER — VANCOMYCIN HCL IN DEXTROSE 1-5 GM/200ML-% IV SOLN
1000.0000 mg | Freq: Once | INTRAVENOUS | Status: AC
  Administered 2012-08-28: 1000 mg via INTRAVENOUS
  Filled 2012-08-28: qty 200

## 2012-08-28 MED ORDER — EZETIMIBE 10 MG PO TABS
10.0000 mg | ORAL_TABLET | Freq: Every day | ORAL | Status: DC
  Administered 2012-08-28 – 2012-08-29 (×2): 10 mg via ORAL
  Filled 2012-08-28 (×4): qty 1

## 2012-08-28 MED ORDER — INSULIN ASPART 100 UNIT/ML ~~LOC~~ SOLN
5.0000 [IU] | Freq: Once | SUBCUTANEOUS | Status: AC
  Administered 2012-08-30: 5 [IU] via SUBCUTANEOUS

## 2012-08-28 MED ORDER — CHLORHEXIDINE GLUCONATE CLOTH 2 % EX PADS
6.0000 | MEDICATED_PAD | Freq: Every day | CUTANEOUS | Status: DC
  Administered 2012-08-29 – 2012-08-30 (×2): 6 via TOPICAL

## 2012-08-28 MED ORDER — ASPIRIN EC 325 MG PO TBEC
325.0000 mg | DELAYED_RELEASE_TABLET | Freq: Every day | ORAL | Status: DC
  Administered 2012-08-29 – 2012-08-30 (×2): 325 mg via ORAL
  Filled 2012-08-28 (×4): qty 1

## 2012-08-28 MED ORDER — LATANOPROST 0.005 % OP SOLN
1.0000 [drp] | Freq: Every day | OPHTHALMIC | Status: DC
  Administered 2012-08-28 – 2012-08-29 (×2): 1 [drp] via OPHTHALMIC
  Filled 2012-08-28 (×2): qty 2.5

## 2012-08-28 MED ORDER — ISOSORBIDE MONONITRATE ER 30 MG PO TB24
30.0000 mg | ORAL_TABLET | Freq: Every day | ORAL | Status: DC
  Administered 2012-08-28 – 2012-08-29 (×2): 30 mg via ORAL
  Filled 2012-08-28 (×3): qty 1

## 2012-08-28 MED ORDER — ISOSORBIDE MONONITRATE ER 30 MG PO TB24
30.0000 mg | ORAL_TABLET | Freq: Every day | ORAL | Status: DC
  Filled 2012-08-28: qty 1

## 2012-08-28 MED ORDER — HEPARIN SODIUM (PORCINE) 5000 UNIT/ML IJ SOLN
5000.0000 [IU] | Freq: Three times a day (TID) | INTRAMUSCULAR | Status: DC
  Filled 2012-08-28 (×9): qty 1

## 2012-08-28 MED ORDER — ACETAMINOPHEN 650 MG RE SUPP: 650.0000 mg | Freq: Four times a day (QID) | RECTAL | Status: DC | PRN

## 2012-08-28 MED ORDER — EZETIMIBE 10 MG PO TABS
10.0000 mg | ORAL_TABLET | Freq: Every day | ORAL | Status: DC
  Filled 2012-08-28: qty 1

## 2012-08-28 MED ORDER — ACETAMINOPHEN 325 MG PO TABS: 650.0000 mg | ORAL_TABLET | Freq: Four times a day (QID) | ORAL | Status: DC | PRN

## 2012-08-28 NOTE — Progress Notes (Signed)
ANTIBIOTIC CONSULT NOTE - INITIAL  Pharmacy Consult for vanc Indication: cellulitis  Allergies  Allergen Reactions  . Adhesive (Tape) Other (See Comments)    "takes my skin off"  . Cephalexin Swelling and Rash  . Ciprofloxacin Swelling and Rash    Vital Signs: Temp: 98.6 F (37 C) (11/14 1419) Temp src: Oral (11/14 1419) BP: 130/53 mmHg (11/14 1419) Pulse Rate: 62  (11/14 1419) Intake/Output from previous day:   Intake/Output from this shift:    Labs:  Basename 08/28/12 1430  WBC 8.1  HGB 11.1*  PLT 220  LABCREA --  CREATININE 1.09   The CrCl is unknown because both a height and weight (above a minimum accepted value) are required for this calculation. No results found for this basename: VANCOTROUGH:2,VANCOPEAK:2,VANCORANDOM:2,GENTTROUGH:2,GENTPEAK:2,GENTRANDOM:2,TOBRATROUGH:2,TOBRAPEAK:2,TOBRARND:2,AMIKACINPEAK:2,AMIKACINTROU:2,AMIKACIN:2, in the last 72 hours   Microbiology: No results found for this or any previous visit (from the past 720 hour(s)).  Medical History: Past Medical History  Diagnosis Date  . HTN (hypertension)   . Hypothyroidism   . Diabetes mellitus, type 2     insulin dependent  . History of recurrent UTIs   . CAD (coronary artery disease)     s/p CABG x3 in 1993; last cath in 2010  . Scleroderma   . Bilateral carpal tunnel syndrome 1970s  . 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 Followed by [name]  . Arthritis   . Anemia   . Claudication   . Aortic stenosis     0.8cm2 on echo in 10/2011  . Hyperlipidemia     on Lipitor  . Myocardial infarction 1986  . Seizure     ? seizure like event in 2010; workup included stress testing which led to repeat cath.   . Cat bite 2009    with MRSA; required I & D  . Heart murmur   . Shortness of breath   . Cancer     breast cancer  . Blood transfusion     no reaction from transfusion  . GERD (gastroesophageal reflux disease)     . CHF (congestive heart failure) Aug. 2012  . B12 deficiency anemia Aug. 2013    B 12 injection  . Cellulitis     Medications:  Prescriptions prior to admission  Medication Sig Dispense Refill  . acetaminophen (TYLENOL) 500 MG tablet Take 1,000 mg by mouth every 6 (six) hours as needed. For pain/fever      . aspirin EC 325 MG EC tablet Take 325 mg by mouth daily.        [name] atorvastatin (LIPITOR) 40 MG tablet Take 1 tablet (40 mg total) by mouth at bedtime.  30 tablet  11  . brimonidine (ALPHAGAN) 0.2 % ophthalmic solution Place 1 drop into both eyes 2 (two) times daily.        . clindamycin (CLEOCIN) 150 MG capsule Take 450 mg by mouth 3 (three) times daily.      . cyanocobalamin (,VITAMIN B-12,) 1000 MCG/ML injection Inject 1,000 mcg into the muscle every 30 (thirty) days.      . diphenhydrAMINE (BENADRYL) 25 mg capsule Take 25 mg by mouth every 6 (six) hours as needed. For cold symptoms      . docusate sodium (COLACE) 50 MG capsule Take by mouth 2 (two) times daily.      [name] ezetimibe (ZETIA) 10 MG tablet Take 1 tablet (10 mg total) by mouth daily.  30 tablet  6  . Ferrous Sulfate (SLOW RELEASE IRON) 50 MG TBCR Take 50 mg by mouth daily.       . furosemide (LASIX) 20 MG tablet Take 40-60 mg by mouth daily. 60 mg on Tues and Sat, 40 mg on all other days      . ibuprofen (ADVIL,MOTRIN) 200 MG tablet Take 400 mg by mouth every 6 (six) hours as needed. For pain      . insulin glargine (LANTUS) 100 UNIT/ML injection Inject 10-20 Units into the skin 2 (two) times daily. 20 units in the morning and 10 units in the evening      . insulin lispro (HUMALOG PEN) 100 UNIT/ML injection Inject 0-9 Units into the skin 4 (four) times daily. Per sliding scale DO NOT GIVE WITHOUT CONSULTING PATIENT ON HER SLIDING SCALE      . isosorbide mononitrate (IMDUR) 30 MG 24 hr tablet Take 30 mg by mouth daily.      [name] latanoprost (XALATAN) 0.005 % ophthalmic solution Place 1 drop into both eyes at bedtime.        [name]  levothyroxine (SYNTHROID, LEVOTHROID) 125 MCG tablet Take 125 mcg by mouth daily.       . metoprolol succinate (TOPROL-XL) 25 MG 24 hr tablet Take 1 tablet (25 mg total) by mouth daily.  30 tablet  11  . nitroGLYCERIN (NITROSTAT) 0.4 MG SL tablet Place 0.4 mg under the tongue every 5 (five) minutes as needed. For chest pain       Assessment: 66 yo who is admitted for leg cellulitis. She was here a few days ago for the same issue and received 2 doses of vanc before dced home on PO clinda. She is now back because the cellulitis is not better.   Goal of Therapy:  Vancomycin trough level 10-15 mcg/ml  Plan:  Vanc 1.25g IV q24 start 11/15  Ulyses Southward Bondurant 08/28/2012,6:04 PM

## 2012-08-28 NOTE — H&P (Signed)
Hospital Admission Note Date: 08/28/2012  Patient name: [name] Medical record number: [id number] Date of birth: Sep 04, 1946 Age: 66 y.o. Gender: female PCP: [name], MD  Medical Service: [facility]  Attending physician: Dr. Meredith Pel  1st Contact: Dr. Heloise Beecham   Pager:[phone] 2nd Contact: Dr. Bosie Clos   [phone] After 5 pm or weekends: 1st Contact:  Intern on Call  Pager: [phone] 2nd Contact:  Resident on call  Pager: [phone]  Chief Complaint: L lower extremity swelling/pain/redness  History of Present Illness: [name] is a 66 year old female with multiple medical problems including CHF, brittle type 1 diabetes, peripheral vascular disease s/p LE bypass surgery, coronary artery disease status post CABG x4, and history of right lower extremity cellulitis presenting with persistent complaints of left lower extremity redness, swelling, erythema. Patient presented to the ED a few days ago with complaints of left lower extremity redness, swelling, and pain. She was treated with IV vancomycin for cellulitis and discharged a course of clindamycin. She reports that she took the clindamycin as prescribed at home, to return to the ED because the area was still very painful. She does acknowledge that erythema has receded from its previous level. She says that erythema pain and swelling has never involved her foot stump on the left side. She does report previous similar episode in June of 2013 for she was hospitalized and treated for lower extremity cellulitis of the right leg. Prior to this episode, she has not had cellulitis of the left leg. She denies fever, chills, nausea, vomiting, diarrhea, dizziness, lightheadedness, headache, chest pain, shortness of breath, palpitations, swelling of right lower extremity, numbness, tingling, joint swelling, dysuria, change in bowel movements, hematochezia, melena.  Meds:   Medication List     As of 08/28/2012  6:56 PM    ASK your  doctor about these medications         acetaminophen 500 MG tablet   Commonly known as: TYLENOL   Take 1,000 mg by mouth every 6 (six) hours as needed. For pain/fever      aspirin EC 325 MG tablet   Take 325 mg by mouth daily.      atorvastatin 40 MG tablet   Commonly known as: LIPITOR   Take 1 tablet (40 mg total) by mouth at bedtime.      brimonidine 0.2 % ophthalmic solution   Commonly known as: ALPHAGAN   Place 1 drop into both eyes 2 (two) times daily.      clindamycin 150 MG capsule   Commonly known as: CLEOCIN   Take 450 mg by mouth 3 (three) times daily.      cyanocobalamin 1000 MCG/ML injection   Commonly known as: (VITAMIN B-12)   Inject 1,000 mcg into the muscle every 30 (thirty) days.      diphenhydrAMINE 25 mg capsule   Commonly known as: BENADRYL   Take 25 mg by mouth every 6 (six) hours as needed. For cold symptoms      docusate sodium 50 MG capsule   Commonly known as: COLACE   Take by mouth 2 (two) times daily.      ezetimibe 10 MG tablet   Commonly known as: ZETIA   Take 1 tablet (10 mg total) by mouth daily.      furosemide 20 MG tablet   Commonly known as: LASIX   Take 40-60 mg by mouth daily. 60 mg on Tues and Sat, 40 mg on all other days      HUMALOG PEN 100 UNIT/ML  injection   Generic drug: insulin lispro   Inject 0-9 Units into the skin 4 (four) times daily. Per sliding scale DO NOT GIVE WITHOUT CONSULTING PATIENT ON HER SLIDING SCALE      ibuprofen 200 MG tablet   Commonly known as: ADVIL,MOTRIN   Take 400 mg by mouth every 6 (six) hours as needed. For pain      insulin glargine 100 UNIT/ML injection   Commonly known as: LANTUS   Inject 10-20 Units into the skin 2 (two) times daily. 20 units in the morning and 10 units in the evening      isosorbide mononitrate 30 MG 24 hr tablet   Commonly known as: IMDUR   Take 30 mg by mouth daily.      latanoprost 0.005 % ophthalmic solution   Commonly known as: XALATAN   Place 1 drop into both  eyes at bedtime.      levothyroxine 125 MCG tablet   Commonly known as: SYNTHROID, LEVOTHROID   Take 125 mcg by mouth daily.      metoprolol succinate 25 MG 24 hr tablet   Commonly known as: TOPROL-XL   Take 1 tablet (25 mg total) by mouth daily.      nitroGLYCERIN 0.4 MG SL tablet   Commonly known as: NITROSTAT   Place 0.4 mg under the tongue every 5 (five) minutes as needed. For chest pain      SLOW RELEASE IRON 50 MG Tbcr   Generic drug: Ferrous Sulfate   Take 50 mg by mouth daily.        Allergies: Allergies as of 08/28/2012 - Review Complete 08/28/2012  Allergen Reaction Noted  . Adhesive (tape) Other (See Comments) 06/11/2011  . Cephalexin Swelling and Rash 02/03/2009  . Ciprofloxacin Swelling and Rash 02/03/2009   Past Medical History  Diagnosis Date  . HTN (hypertension)   . Hypothyroidism   . Diabetes mellitus, type 2     insulin dependent  . History of recurrent UTIs   . CAD (coronary artery disease)     s/p CABG x3 in 1993; last cath in 2010  . Scleroderma   . Bilateral carpal tunnel syndrome 1970s  . 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 Followed by [name]  . Arthritis   . Anemia   . Claudication   . Aortic stenosis     0.8cm2 on echo in 10/2011  . Hyperlipidemia     on Lipitor  . Myocardial infarction 1986  . Seizure     ? seizure like event in 2010; workup included stress testing which led to repeat cath.   . Cat bite 2009    with MRSA; required I & D  . Heart murmur   . Shortness of breath   . Cancer     breast cancer  . Blood transfusion     no reaction from transfusion  . GERD (gastroesophageal reflux disease)   . CHF (congestive heart failure) Aug. 2012    Echo 10/2011: Mild LVH, EF 30%, apex akinetic, septal HK, grade 2 diastolic dysfunction, or functionally bicuspid aortic valve, mild aortic stenosis by gradient, severe by calculated AVA-suspect A. Aortic stenosis  probably moderate, trivial MR, mild LAE, mild RAE.   [name] B12 deficiency anemia Aug. 2013    B 12 injection  . Cellulitis   . Type 1 diabetes    Past Surgical History  Procedure Date  .  Carpal tunnel release   . Mastectomy 11/1982    Left Breast  . Vitrectomy   . Tubal ligation   . Tonsillectomy   . Coronary artery bypass graft 1993    LIMA to LAD, SVG to distal LCX & SVG to Marginal  . Appendectomy   . Amputation     left first and second toes  . Cardiac catheterization 2010    LIMA to LAD patent yet with occluded distal LAD after graft insertion & retrograde is occluded. 3-4 septal perforators arise &are patent. SVG to a large marginal patent; SVG presumably to the distal dominant LCX is occluded, moderately high grade stenosis of the AV circumflex, but with distal stenoses involving a severely diseased marginal branch not amenable  to PCI  nor is inferior branch   . Iron infusion Aug. 30, 2013   Family History  Problem Relation Age of Onset  . Diabetes Mother    History   Social History  . Marital Status: Single    Spouse Name: N/A    Number of Children: N/A  . Years of Education: N/A   Occupational History  . Environmental health practitioner    Social History Main Topics  . Smoking status: Never Smoker   . Smokeless tobacco: Never Used  . Alcohol Use: No  . Drug Use: No  . Sexually Active: Not Currently    Birth Control/ Protection: Post-menopausal   Other Topics Concern  . Not on file   Social History Narrative  . No narrative on file     Review of Systems: 10 pt ROS performed, pertinent positives and negatives noted in HPI   Physical Exam: Blood pressure 131/74, pulse 73, temperature 97.6 F (36.4 C), temperature source Oral, resp. rate 18, height 5\' 4"  (1.626 m), weight 155 lb (70.308 kg), SpO2 97.00%. Vitals reviewed. General: resting in bed, NAD HEENT: PERRL, EOMI, no scleral icterus Cardiac: Harsh, 3/6 systolic murmur heard best in RUSB. RRR, no rubs or  gallops Pulm: clear to auscultation bilaterally, no wheezes, rales, or rhonchi Abd: soft, nontender, nondistended, BS present Ext: Foot stump of L LE is without erythema and is well perfused. 2+ edema and circumferential edema of L lower extremity from ankle to mid shin. Exquisitely TTP. No abscess formation or necrosis. Receded from previous demarcation line on 08/25/12.  RLE w chronic stasis changes but no edema or erythema. Multiple surgical scars bilaterally from L peripheral bypass and R sided vein harvesting. Neuro: alert and oriented X3, cranial nerves II-XII grossly intact, strength and sensation to light touch equal in bilateral upper and lower extremities    Lab results: Basic Metabolic Panel:  Basename 08/28/12 1430  NA 135  K 4.8  CL 101  CO2 25  GLUCOSE 254*  BUN 28*  CREATININE 1.09  CALCIUM 9.4  MG --  PHOS --   CBC:  Basename 08/28/12 1430  WBC 8.1  NEUTROABS --  HGB 11.1*  HCT 33.0*  MCV 87.5  PLT 220               Assessment & Plan by Problem: 1. Cellulitis Patient has circumferential erythema and exquisite pain of left lower extremity from ankle to two thirds way up the shin. No evidence of abscess or necrosis. Good perfusion of left foot stump. No evidence of systemic infection, patient is without fever or leukocytosis and is normotensive. The erythema appears to be receding from prior demarcation line drawn on 11/10. She appears to be improving clinically, with the exception  of continued pain. Dopplers done at prior visit 11/10 did not demonstrate DVT. -Will continue IV vancomycin and watch her overnight - Follow a.m. CBC  2. Type 1 diabetes Patient is brittle diabetic. Will continue home lantus dose and provide sliding scale coverage.  - CBGs w meals and qhs  3. CHF Recent EF 30% in January. Stable, no evidence of volume overload on examination. Pt w/o chest pain or SOB. Followed by [facility] ([name]) - Continue home lasix,  metoprolol  4. HTN Normotensive.  - Continue home antihypertensives  Dispo: Disposition is deferred at this time, awaiting improvement of current medical problems. Anticipated discharge in approximately 2 day(s).   The patient does have current PCP ([facility], Brien Few, MD), therefore will require [facility] follow-up after discharge.   The patient does have transportation limitations that hinder transportation to clinic appointments.  Signed: Bronson Curb 08/28/2012, 6:56 PM

## 2012-08-28 NOTE — H&P (Signed)
Internal Medicine Attending Admission Note Date: 08/28/2012  Patient name: [name] Medical record number: [id number] Date of birth: Mar 24, 1946 Age: 66 y.o. Gender: female  I saw and evaluated the patient. I reviewed the resident's note and I agree with the resident's findings and plan as documented in the resident's note, with the following additional comments.  Chief Complaint(s): Left leg swelling/pain/erythema  History - key components related to admission: Patient is a 66 year old woman with history of type 1 diabetes, peripheral vascular disease S/P revascularization, right lower extremity cellulitis, coronary artery disease status post CABG, and other problems as outlined in the medical history admitted with complaint of left leg swelling, pain, and erythema which did not improve on oral clindamycin prescribed and a recent ED visit.   Physical Exam - key components related to admission:  Filed Vitals:   08/28/12 1322 08/28/12 1419 08/28/12 1743  BP: 139/55 130/53 131/74  Pulse: 83 62 73  Temp: 98.3 F (36.8 C) 98.6 F (37 C) 97.6 F (36.4 C)  TempSrc: Oral Oral   Resp: 18 16 18   Height:  5\' 4"  (1.626 m)   Weight:  155 lb (70.308 kg)   SpO2: 100% 96% 97%   General: Alert, no acute distress Lungs: Clear Heart: Regular; no extra sounds or murmurs Abdomen: Bowel sounds present, soft, nontender Extremities: The left leg is warm, swollen, with erythema extending up the lower half of the leg; it is tender to palpation, especially in the lower posterior leg above the ankle.  There is no apparent fluctuance and no drainage.  Her left foot is warm and appears well perfused.  Lab results:   Basic Metabolic Panel:  Basename 08/28/12 1430  NA 135  K 4.8  CL 101  CO2 25  GLUCOSE 254*  BUN 28*  CREATININE 1.09  CALCIUM 9.4  MG --  PHOS --     CBC:  Basename 08/28/12 1430  WBC 8.1  NEUTROABS --  HGB 11.1*  HCT 33.0*  MCV 87.5  PLT 220     CBG:  Basename 08/28/12 1836  GLUCAP 204*    Imaging results:  No results found.  Other results: Lower extremity venous duplex study 08/24/2012 was negative for DVT  Assessment & Plan by Problem:  1.  Cellulitis of left leg.  Patient did not respond to oral clindamycin as outpatient.  The plan is empiric treatment with IV vancomycin; if her leg has not improved by tomorrow would obtain MRI of the leg and foot.  2.  Type 1 diabetes mellitus.  Patient reports a history of brittle type 1 diabetes mellitus.  Plan is continue her home insulin regimen, with correction insulin appropriate to her home dosing.  3.  Other problems and plans as per resident physician note.

## 2012-08-28 NOTE — ED Notes (Signed)
Pt presents with worsening redness and pain to LLE.  Pt reports she was in CDU and received abx, reports redness is spreading.

## 2012-08-28 NOTE — ED Notes (Signed)
Pt refused insulin until dinner

## 2012-08-28 NOTE — ED Provider Notes (Signed)
History     CSN: [id number]  Arrival date & time 08/28/12  1318   First MD Initiated Contact with Patient 08/28/12 1418      Chief Complaint  Patient presents with  . Leg Pain    (Consider location/radiation/quality/duration/timing/severity/associated sxs/prior treatment) Patient is a 66 y.o. female presenting with leg pain. The history is provided by the patient. No language interpreter was used.  Leg Pain  The incident occurred more than 1 week ago. The incident occurred at home. There was no injury mechanism. The quality of the pain is described as aching. The pain is at a severity of 21/11.   66 year old female with type 2 diabetes, hypertension, CAD, PVD, and MRSA returning to the ER after IV antibiotics in CDU on 11/10 and 11/11 for her left lower extremity cellulitis. The erythema started 5 days ago and she spent the night in CDU to received 2 doses of IV vancomycin. She went home on by mouth clindamycin. Since she went home  the pain has become worse and the swelling has become worse even though the erythema has become somewhat less as it was outlined with a surgical pen. Past medical history of cellulitis to the same extremity. 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  Positive CMS to extremity . Negative for DVT on  08/24/12.   Past Medical History  Diagnosis Date  . HTN (hypertension)   . Hypothyroidism   . Diabetes mellitus, type 2     insulin dependent  . History of recurrent UTIs   . CAD (coronary artery disease)     s/p CABG x3 in 1993; last cath in 2010  . Scleroderma   . Bilateral carpal tunnel syndrome 1970s  . 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 Followed by [name]  . Arthritis   . Anemia   . Claudication   . Aortic stenosis     0.8cm2 on echo in 10/2011  . Hyperlipidemia     on Lipitor  . Myocardial infarction 1986  . Seizure     ? seizure like event in  2010; workup included stress testing which led to repeat cath.   . Cat bite 2009    with MRSA; required I & D  . Heart murmur   . Shortness of breath   . Cancer     breast cancer  . Blood transfusion     no reaction from transfusion  . GERD (gastroesophageal reflux disease)   . CHF (congestive heart failure) Aug. 2012  . B12 deficiency anemia Aug. 2013    B 12 injection  . Cellulitis     Past Surgical History  Procedure Date  . Carpal tunnel release   . Mastectomy 11/1982    Left Breast  . Vitrectomy   . Tubal ligation   . Tonsillectomy   . Coronary artery bypass graft 1993    LIMA to LAD, SVG to distal LCX & SVG to Marginal  . Appendectomy   . Amputation     left first and second toes  . Cardiac catheterization 2010    LIMA to LAD patent yet with occluded distal LAD after graft insertion & retrograde is occluded. 3-4 septal perforators arise &are patent. SVG to a large marginal patent; SVG presumably to the distal dominant LCX is occluded, moderately high grade stenosis of the AV circumflex, but with distal stenoses involving  a severely diseased marginal branch not amenable  to PCI  nor is inferior branch   . Iron infusion Aug. 30, 2013    Family History  Problem Relation Age of Onset  . Diabetes Mother     History  Substance Use Topics  . Smoking status: Never Smoker   . Smokeless tobacco: Never Used  . Alcohol Use: No    OB History    Grav Para Term Preterm Abortions TAB SAB Ect Mult Living                  Review of Systems  Constitutional: Negative.   HENT: Negative.   Eyes: Negative.   Respiratory: Negative.   Cardiovascular: Negative.   Gastrointestinal: Negative.   Musculoskeletal:       LLE cellulitis painful and swollen 1/2 foot amputation  Neurological: Negative.   Psychiatric/Behavioral: Negative.   All other systems reviewed and are negative.    Allergies  Adhesive; Cephalexin; and Ciprofloxacin  Home Medications   Current Outpatient  Rx  Name  Route  Sig  Dispense  Refill  . ACETAMINOPHEN 500 MG PO TABS   Oral   Take 1,000 mg by mouth every 6 (six) hours as needed. For pain/fever         . ASPIRIN EC 325 MG PO TBEC   Oral   Take 325 mg by mouth daily.           . ATORVASTATIN CALCIUM 40 MG PO TABS   Oral   Take 1 tablet (40 mg total) by mouth at bedtime.   30 tablet   11     Dispense as written.   [name] BRIMONIDINE TARTRATE 0.2 % OP SOLN   Both Eyes   Place 1 drop into both eyes 2 (two) times daily.           [name] CLINDAMYCIN HCL 150 MG PO CAPS   Oral   Take 450 mg by mouth 3 (three) times daily.         . CYANOCOBALAMIN 1000 MCG/ML IJ SOLN   Intramuscular   Inject 1,000 mcg into the muscle every 30 (thirty) days.         [name] DIPHENHYDRAMINE HCL 25 MG PO CAPS   Oral   Take 25 mg by mouth every 6 (six) hours as needed. For cold symptoms         . DOCUSATE SODIUM 50 MG PO CAPS   Oral   Take by mouth 2 (two) times daily.         [name] EZETIMIBE 10 MG PO TABS   Oral   Take 1 tablet (10 mg total) by mouth daily.   30 tablet   6   . FERROUS SULFATE ER 50 MG PO TBCR   Oral   Take 50 mg by mouth daily.          . FUROSEMIDE 20 MG PO TABS   Oral   Take 40-60 mg by mouth daily. 60 mg on Tues and Sat, 40 mg on all other days         . IBUPROFEN 200 MG PO TABS   Oral   Take 400 mg by mouth every 6 (six) hours as needed. For pain         . INSULIN GLARGINE 100 UNIT/ML [place] SOLN   Subcutaneous   Inject 10-20 Units into the skin 2 (two) times daily. 20 units in the morning and 10 units in the evening         .  INSULIN LISPRO (HUMAN) 100 UNIT/ML [place] SOLN   Subcutaneous   Inject 0-9 Units into the skin 4 (four) times daily. Per sliding scale DO NOT GIVE WITHOUT CONSULTING PATIENT ON HER SLIDING SCALE         . ISOSORBIDE MONONITRATE ER 30 MG PO TB24   Oral   Take 30 mg by mouth daily.         [name] LATANOPROST 0.005 % OP SOLN   Both Eyes   Place 1 drop into both eyes at bedtime.            [name] LEVOTHYROXINE SODIUM 125 MCG PO TABS   Oral   Take 125 mcg by mouth daily.          [name] METOPROLOL SUCCINATE ER 25 MG PO TB24   Oral   Take 1 tablet (25 mg total) by mouth daily.   30 tablet   11   . NITROGLYCERIN 0.4 MG SL SUBL   Sublingual   Place 0.4 mg under the tongue every 5 (five) minutes as needed. For chest pain           BP 130/53  Pulse 62  Temp 98.6 F (37 C) (Oral)  Resp 16  SpO2 96%  Physical Exam  Nursing note and vitals reviewed. Constitutional: She is oriented to person, place, and time. She appears well-developed and well-nourished.  HENT:  Head: Normocephalic and atraumatic.  Eyes: Conjunctivae normal and EOM are normal. Pupils are equal, round, and reactive to light.  Neck: Normal range of motion. Neck supple.  Cardiovascular: Normal rate.   Pulmonary/Chest: Effort normal.  Abdominal: Soft.  Musculoskeletal: Normal range of motion. She exhibits edema and tenderness.       LLe erythema and tenderness. +CMS to extremity  Neurological: She is alert and oriented to person, place, and time. She has normal reflexes. No cranial nerve deficit. Coordination normal.  Skin: Skin is warm and dry.  Psychiatric: She has a normal mood and affect.    ED Course  Procedures (including critical care time)   Spoke with Dr. Bosie Clos with Internal medicine and she will be down to see patient and admit.  Holding orders pending.   Labs Reviewed  CBC - Abnormal; Notable for the following:    RBC 3.77 (*)     Hemoglobin 11.1 (*)     HCT 33.0 (*)     RDW 16.3 (*)     All other components within normal limits  BASIC METABOLIC PANEL - Abnormal; Notable for the following:    Glucose, Bld 254 (*)     BUN 28 (*)     GFR calc non Af Amer 51 (*)     GFR calc Af Amer 59 (*)     All other components within normal limits   No results found.   No diagnosis found.    MDM  Patient will be admitted for cellulitis to LLE with outpatient antibiotic failure.  She  was in CDU for 2 doses of vancomycin as well three days ago.    Wbc  8.1. VSS.  Brittle diabetic. Internal med will admit.  Holding orders pending.   Labs Reviewed  CBC - Abnormal; Notable for the following:    RBC 3.77 (*)     Hemoglobin 11.1 (*)     HCT 33.0 (*)     RDW 16.3 (*)     All other components within normal limits  BASIC METABOLIC PANEL - Abnormal; Notable for the following:  Glucose, Bld 254 (*)     BUN 28 (*)     GFR calc non Af Amer 51 (*)     GFR calc Af Amer 59 (*)     All other components within normal limits          Remi Haggard, NP 08/28/12 1558

## 2012-08-28 NOTE — ED Notes (Addendum)
Pt refused to allow RN to start IV. Pt informed that ED policy is ED RN attempt prior to paging IV team.   Pt states she always gets the IV team.  Pt demanding to see MD from internal medicine that discharged her prior to her being ready to be discharged.  Pt states the LLE is not improving and has even gotten progressively worse since she was discharged early.  Pt states she was seen on Sunday and discharged Monday after IV antibiotics.  Pt also demanding she use her humolog meds from home and be given her food and medications at the correct time.  As she states she was not allowed to eat on a normal schedule last visit to this ED.   Novolog is not effective per patient.  Charge nurse notified of pt greivences.

## 2012-08-28 NOTE — ED Notes (Signed)
Admitting physician at bedside

## 2012-08-29 DIAGNOSIS — E109 Type 1 diabetes mellitus without complications: Secondary | ICD-10-CM

## 2012-08-29 LAB — BASIC METABOLIC PANEL
BUN: 27 mg/dL — ABNORMAL HIGH (ref 6–23)
Chloride: 101 mEq/L (ref 96–112)
Glucose, Bld: 126 mg/dL — ABNORMAL HIGH (ref 70–99)
Potassium: 4.4 mEq/L (ref 3.5–5.1)
Sodium: 134 mEq/L — ABNORMAL LOW (ref 135–145)

## 2012-08-29 LAB — CBC
HCT: 32.4 % — ABNORMAL LOW (ref 36.0–46.0)
Hemoglobin: 10.9 g/dL — ABNORMAL LOW (ref 12.0–15.0)
MCH: 29.8 pg (ref 26.0–34.0)
MCHC: 33.6 g/dL (ref 30.0–36.0)
RBC: 3.66 MIL/uL — ABNORMAL LOW (ref 3.87–5.11)

## 2012-08-29 LAB — GLUCOSE, CAPILLARY
Glucose-Capillary: 166 mg/dL — ABNORMAL HIGH (ref 70–99)
Glucose-Capillary: 223 mg/dL — ABNORMAL HIGH (ref 70–99)
Glucose-Capillary: 146 mg/dL — ABNORMAL HIGH (ref 70–99)

## 2012-08-29 MED ORDER — DOCUSATE SODIUM 50 MG PO CAPS: 50.0000 mg | ORAL_CAPSULE | Freq: Two times a day (BID) | ORAL | Status: DC

## 2012-08-29 MED ORDER — DOCUSATE SODIUM 100 MG PO CAPS
100.0000 mg | ORAL_CAPSULE | Freq: Every day | ORAL | Status: DC
  Administered 2012-08-29 – 2012-08-30 (×2): 100 mg via ORAL
  Filled 2012-08-29: qty 1

## 2012-08-29 MED ORDER — DOCUSATE SODIUM 100 MG PO CAPS
100.0000 mg | ORAL_CAPSULE | Freq: Two times a day (BID) | ORAL | Status: DC
Start: 2012-08-29 — End: 2012-08-29
  Filled 2012-08-29: qty 1

## 2012-08-29 NOTE — Discharge Summary (Signed)
Internal Medicine Teaching [facility] Discharge Note  Name: [name] MRN: [id number] DOB: June 23, 1946 66 y.o.  Date of Admission: 08/28/2012  2:02 PM Date of Discharge: 08/30/2012 Attending Physician: Aletta Edouard, MD  Discharge Diagnosis: Principal Problem:  *Cellulitis of left leg without foot Type 1 DM CHF without exacerbation HTN  Discharge Medications:   Medication List     As of 08/30/2012 11:40 AM    STOP taking these medications         clindamycin 150 MG capsule   Commonly known as: CLEOCIN      TAKE these medications         acetaminophen 500 MG tablet   Commonly known as: TYLENOL   Take 1,000 mg by mouth every 6 (six) hours as needed. For pain/fever      aspirin EC 325 MG tablet   Take 325 mg by mouth daily.      atorvastatin 40 MG tablet   Commonly known as: LIPITOR   Take 1 tablet (40 mg total) by mouth at bedtime.      brimonidine 0.2 % ophthalmic solution   Commonly known as: ALPHAGAN   Place 1 drop into both eyes 2 (two) times daily.      cyanocobalamin 1000 MCG/ML injection   Commonly known as: (VITAMIN B-12)   Inject 1,000 mcg into the muscle every 30 (thirty) days.      diphenhydrAMINE 25 mg capsule   Commonly known as: BENADRYL   Take 25 mg by mouth every 6 (six) hours as needed. For cold symptoms      docusate sodium 50 MG capsule   Commonly known as: COLACE   Take by mouth 2 (two) times daily.      ezetimibe 10 MG tablet   Commonly known as: ZETIA   Take 1 tablet (10 mg total) by mouth daily.      furosemide 20 MG tablet   Commonly known as: LASIX   Take 40-60 mg by mouth daily. 60 mg on Tues and Sat, 40 mg on all other days      HUMALOG PEN 100 UNIT/ML injection   Generic drug: insulin lispro   Inject 0-9 Units into the skin 4 (four) times daily. Per sliding scale DO NOT GIVE WITHOUT CONSULTING PATIENT ON HER SLIDING SCALE      ibuprofen 200 MG tablet   Commonly known as: ADVIL,MOTRIN   Take 400 mg by  mouth every 6 (six) hours as needed. For pain      insulin glargine 100 UNIT/ML injection   Commonly known as: LANTUS   Inject 10-20 Units into the skin 2 (two) times daily. 20 units in the morning and 10 units in the evening      isosorbide mononitrate 30 MG 24 hr tablet   Commonly known as: IMDUR   Take 30 mg by mouth daily.      latanoprost 0.005 % ophthalmic solution   Commonly known as: XALATAN   Place 1 drop into both eyes at bedtime.      levothyroxine 125 MCG tablet   Commonly known as: SYNTHROID, LEVOTHROID   Take 125 mcg by mouth daily.      metoprolol succinate 25 MG 24 hr tablet   Commonly known as: TOPROL-XL   Take 1 tablet (25 mg total) by mouth daily.      nitroGLYCERIN 0.4 MG SL tablet   Commonly known as: NITROSTAT   Place 0.4 mg under the tongue every 5 (five) minutes as needed.  For chest pain      SLOW RELEASE IRON 50 MG Tbcr   Generic drug: Ferrous Sulfate   Take 50 mg by mouth daily.      sulfamethoxazole-trimethoprim 800-160 MG per tablet   Commonly known as: BACTRIM DS   Take 2 tablets by mouth every 12 (twelve) hours.         Disposition and follow-up:   [name] was discharged from [facility] in good condition.  At the hospital follow up visit please address  1) Resolution of L LE cellulitis Patient discharged on 14 day course of DS bactrim for L lower extremity cellulitis. If no resolution/worsening, consider linezolid which has worked in the past, although expensive for the patient. Patient has close Vascular follow up for her peripheral vascular disease.   Follow-up Appointments:  Discharge Orders    Future Appointments: Provider: Department: Dept Phone: Center:   09/02/2012 1:15 PM [name], MD [facility] [phone] [facility]   09/24/2012 4:00 PM [facility] [place] [facility] [phone] None   10/28/2012 4:00 PM [facility] Nurse [place]  [facility] [phone] None   11/26/2012 11:30 AM [facility] MEDICAL ONCOL[facility] [phone] None   11/26/2012 12:00 PM Victorino December, MD [facility] MEDICAL ONCOLOGY [phone] None   12/16/2012 1:30 PM [facility] [facility] [phone] [facility]   12/16/2012 2:00 PM [facility] [phone] [facility]   12/16/2012 2:40 PM Evern Bio, NP [facility] [phone] [facility]   12/23/2012 4:00 PM [facility] Nurse [place] [facility] [phone] None   01/20/2013 4:00 PM [facility] Nurse [place] [facility] [phone] None   02/17/2013 4:00 PM [facility] In[facility] Nurse [place] [facility] [phone] None   03/17/2013 4:00 PM [facility] In[facility] Nurse [place] [facility] [phone] None     Future Orders Please Complete By Expires   Increase activity slowly      Discharge instructions      Comments:   Take the Bactrim DS as prescribed and follow-up in the Internal Medicine Clinic as scheduled.      Admission HPI:  [name] is a 66 year old female with multiple medical problems including CHF, brittle type 1 diabetes, peripheral vascular disease s/p LE bypass surgery and transmetatarsal amputation of L foot, coronary artery disease status post CABG x4, and history of right lower extremity cellulitis presenting with persistent complaints of left lower extremity redness, swelling, erythema.  Patient presented to the ED a few days ago with complaints of left lower extremity redness, swelling, and pain. She was treated with IV vancomycin for cellulitis and discharged a course of clindamycin. She reports that she took the clindamycin as prescribed at home, to return to the ED because the area was still very painful. She does acknowledge that erythema has  receded from its previous level. She says that erythema pain and swelling has never involved her foot stump on the left side. She does report previous similar episode in June of 2013 for she was hospitalized and treated for lower extremity cellulitis of the right leg. Prior to this episode, she has not had cellulitis of the left leg.  She denies fever, chills, nausea, vomiting, diarrhea, dizziness, lightheadedness, headache, chest pain, shortness of breath, palpitations, swelling of right lower extremity, numbness,  tingling, joint swelling, dysuria, change in bowel movements, hematochezia, melena.   Hospital Course by problem list:  1. Cellulitis of L leg without foot She had prior transmetatarsal amputation of L foot 2/2 peripheral vascular disease and infected diabetic ulcer disease. She was admitted to the hospital with circumferential erythema and exquisite pain of left lower extremity from ankle to two thirds way up the shin. Her erythema had receded from the marker line drawn during her ED visit on 08/24/12.  Dopplers done at prior visit 11/10 did not demonstrate DVT. She had no evidence of abscess or necrosis and had good perfusion of left foot stump. No evidence of systemic infection, patient was without fever or leukocytosis and was normotensive throughout admission. She received vancomycin in the ED which was continued upon admission to the floor. On hospital day 2, erythema had receded further and edema was much improved. She received a total of 4 days IV vancomycin therapy and was discharged on 14 day course of Bactrim DS 2 tablets BID.  2. Type 1 diabetes  Patient is brittle diabetic. We continued her home lantus dose and provided sliding scale coverage. No hypoglycemic events.  3. CHF  Recent EF 30% in January. Stable, no evidence of volume overload on admission. Pt w/o complaints of chest pain or SOB. Followed by [facility] ([name]) We continued home lasix, metoprolol.   4. HTN    Pt was normotensive throughout admission. We continued her home antihypertensives   Discharge Vitals:  BP 159/52  Pulse 79  Temp 97.9 F (36.6 C) (Oral)  Resp 18  Ht 5\' 4"  (1.626 m)  Wt 155 lb (70.308 kg)  BMI 26.61 kg/m2  SpO2 100%  Discharge Day PE General: sitting at bedside window, NAD  Pulm: normal work of breathing  Ext:1-2+ edema of L lower extremity, Erythema of L LE has receded from demarcation line drawn 08/29/12. No abscess or necrosis. RLE w chronic stasis changes but no edema or erythema. Multiple surgical scars bilaterally from L peripheral bypass and R sided vein harvesting.  Neuro: alert and oriented X3, cranial nerves II-XII grossly intact, strength and sensation to light touch equal in bilateral upper and lower extremities  Discharge Labs:  Results for orders placed during the hospital encounter of 08/28/12 (from the past 24 hour(s))  GLUCOSE, CAPILLARY     Status: Abnormal   Collection Time   08/29/12 12:03 PM      Component Value Range   Glucose-Capillary 166 (*) 70 - 99 mg/dL   Comment 1 Notify RN    GLUCOSE, CAPILLARY     Status: Abnormal   Collection Time   08/29/12  5:00 PM      Component Value Range   Glucose-Capillary 223 (*) 70 - 99 mg/dL   Comment 1 Notify RN    GLUCOSE, CAPILLARY     Status: Abnormal   Collection Time   08/29/12 10:36 PM      Component Value Range   Glucose-Capillary 146 (*) 70 - 99 mg/dL  CBC     Status: Abnormal   Collection Time   08/30/12  6:50 AM      Component Value Range   WBC 6.6  4.0 - 10.5 K/uL   RBC 3.62 (*) 3.87 - 5.11 MIL/uL   Hemoglobin 10.9 (*) 12.0 - 15.0 g/dL   HCT 16.1 (*) 09.6 - 04.5 %   MCV 89.0  78.0 - 100.0 fL   MCH 30.1  26.0 - 34.0 pg   MCHC  33.9  30.0 - 36.0 g/dL   RDW 16.1 (*) 09.6 - 04.5 %   Platelets 208  150 - 400 K/uL  BASIC METABOLIC PANEL     Status: Abnormal   Collection Time   08/30/12  6:50 AM      Component Value Range   Sodium 135  135 - 145 mEq/L   Potassium 4.7  3.5 - 5.1  mEq/L   Chloride 102  96 - 112 mEq/L   CO2 24  19 - 32 mEq/L   Glucose, Bld 247 (*) 70 - 99 mg/dL   BUN 33 (*) 6 - 23 mg/dL   Creatinine, Ser 4.09 (*) 0.50 - 1.10 mg/dL   Calcium 9.2  8.4 - 81.1 mg/dL   GFR calc non Af Amer 48 (*) >90 mL/min   GFR calc Af Amer 56 (*) >90 mL/min    Signed: Bronson Curb 09/01/12 0823  Time Spent on Discharge: 35 min Services Ordered on Discharge: none Equipment Ordered on Discharge none    ------------------------------------------------ 08/30/12 10:15am (time of exam).  I saw this patient myself this morning. I discussed the plan of care with Dr. Bosie Clos. I agree with her plan. I think the patient is ok to be discharged at this point.   Thanks [employer]

## 2012-08-29 NOTE — ED Provider Notes (Signed)
Medical screening examination/treatment/procedure(s) were conducted as a shared visit with non-physician practitioner(Anne Crawford) and myself.  I personally evaluated the patient during the encounter.  The patient presents here with redness and pain in the left lower extremity.  She was recently diagnosed with cellulitis and was here under the cdu cellulitis protocol.  She was discharged and returns with increased pain and redness despite taking her oral antibiotics.  She denies fevers or chills.    On exam, the vitals are stable and the patient is afebrile.  The heart and lung exam is unremarkable.  The left lower extremity is noted to have redness, warmth, and swelling in a sock-like distribution.  The distal pulses are intact.    She appears to be having a recurring cellulitis and is failing outpatient po antibiotics.  Thurston Hole will speak with internal medicine to make arrangements for admission.    Geoffery Lyons, MD 08/29/12 1006

## 2012-08-29 NOTE — Progress Notes (Signed)
Subjective: Pt reports improvement in L lower extremity edema and erythema from yesterday. She is still very tender, but stays that she feels better. She has no other complaints this morning, wants to make sure she does not get IV fluids due to her CHF. Wants to go to her college reunion tomorrow.  Denies CP, SOB, N/V, dizziness, abdominal pain, diarrhea.  Objective: Vital signs in last 24 hours: Filed Vitals:   08/28/12 1419 08/28/12 1743 08/28/12 2148 08/29/12 0635  BP: 130/53 131/74 153/54 147/50  Pulse: 62 73 68 67  Temp: 98.6 F (37 C) 97.6 F (36.4 C) 98.2 F (36.8 C) 98.3 F (36.8 C)  TempSrc: Oral     Resp: 16 18 20 17   Height: 5\' 4"  (1.626 m)     Weight: 155 lb (70.308 kg)     SpO2: 96% 97% 99% 100%   Weight change:   Intake/Output Summary (Last 24 hours) at 08/29/12 0852 Last data filed at 08/29/12 0547  Gross per 24 hour  Intake  469.5 ml  Output      0 ml  Net  469.5 ml  General: sitting up eating breakfast, NAD  HEENT: PERRL, EOMI, no scleral icterus  Cardiac: Harsh, 3/6 systolic murmur heard best in RUSB. RRR, no rubs or gallops  Pulm: clear to auscultation bilaterally, no wheezes, rales, or rhonchi  Abd: soft, nontender, nondistended, BS present  Ext: Foot stump of L LE is without erythema and is well perfused. 2+ edema and circumferential edema of L lower extremity from ankle to mid shin, much improved from yesterday. Erythema of L LE has receded from demarcation line drawn 08/29/12. Still TTP, but patient reports more comfort today than yesterday. No abscess or necrosis. RLE w chronic stasis changes but no edema or erythema. Multiple surgical scars bilaterally from L peripheral bypass and R sided vein harvesting.  Neuro: alert and oriented X3, cranial nerves II-XII grossly intact, strength and sensation to light touch equal in bilateral upper and lower extremities  Lab Results: Basic Metabolic Panel:  Lab 08/29/12 7829 08/28/12 1430  NA 134* 135  K 4.4 4.8    CL 101 101  CO2 27 25  GLUCOSE 126* 254*  BUN 27* 28*  CREATININE 1.23* 1.09  CALCIUM 9.4 9.4  MG -- --  PHOS -- --   CBC:  Lab 08/29/12 0524 08/28/12 1430 08/24/12 1903  WBC 7.2 8.1 --  NEUTROABS -- -- 6.2  HGB 10.9* 11.1* --  HCT 32.4* 33.0* --  MCV 88.5 87.5 --  PLT 218 220 --   CBG:  Lab 08/29/12 0743 08/28/12 2230 08/28/12 1836 08/25/12 0822 08/25/12 0352 08/24/12 2232  GLUCAP 119* 112* 204* 103* 195* 128*    Micro Results: Recent Results (from the past 240 hour(s))  MRSA PCR SCREENING     Status: Abnormal   Collection Time   08/28/12  8:31 PM      Component Value Range Status Comment   MRSA by PCR POSITIVE (*) NEGATIVE Final    Studies/Results: No results found. Medications: I have reviewed the patient's current medications. Scheduled Meds:   . aspirin EC  325 mg Oral Daily  . atorvastatin  40 mg Oral QHS  . brimonidine  1 drop Both Eyes BID  . Chlorhexidine Gluconate Cloth  6 each Topical Q0600  . cyanocobalamin  1,000 mcg Intramuscular Q30 days  . ezetimibe  10 mg Oral QHS  . ferrous sulfate  325 mg Oral Q breakfast  . furosemide  40-60  mg Oral Daily  . heparin  5,000 Units Subcutaneous Q8H  . influenza  inactive virus vaccine  0.5 mL Intramuscular Once  . insulin aspart  0-5 Units Subcutaneous QHS  . insulin aspart  0-9 Units Subcutaneous TID WC  . insulin aspart  5 Units Subcutaneous Once  . insulin glargine  10 Units Subcutaneous QHS  . insulin glargine  20 Units Subcutaneous q morning - 10a  . isosorbide mononitrate  30 mg Oral QHS  . latanoprost  1 drop Both Eyes QHS  . levothyroxine  125 mcg Oral QAC breakfast  . metoprolol succinate  25 mg Oral Daily  . mupirocin ointment  1 application Nasal BID  . vancomycin  1,250 mg Intravenous Q24H  . [COMPLETED] vancomycin  1,000 mg Intravenous Once  . [DISCONTINUED] clindamycin  450 mg Oral TID  . [DISCONTINUED] ezetimibe  10 mg Oral Daily  . [DISCONTINUED] Ferrous Sulfate  50 mg Oral Daily  .  [DISCONTINUED] insulin glargine  10-20 Units Subcutaneous BID  . [DISCONTINUED] insulin glargine  20 Units Subcutaneous Daily  . [DISCONTINUED] isosorbide mononitrate  30 mg Oral Daily   Continuous Infusions:   . sodium chloride 20 mL/hr at 08/28/12 1825  . [DISCONTINUED] sodium chloride 125 mL/hr at 08/28/12 1825   PRN Meds:.acetaminophen, acetaminophen, oxyCODONE-acetaminophen Assessment/Plan: [name] is a 66 year old female with multiple medical problems including CHF, brittle type 1 diabetes, peripheral vascular disease s/p LE bypass surgery, coronary artery disease status post CABG x4, and history of right lower extremity cellulitis presenting with persistent complaints of left lower extremity redness, swelling, erythema.  1. Cellulitis  Patient has circumferential erythema and exquisite pain of left lower extremity from ankle to two thirds way up the shin. On admission, the erythema appeared to be receding from prior demarcation line drawn on 11/10, but she had increased edema of leg. Dopplers done at prior visit 11/10 did not demonstrate DVT.  This morning, edema is much improved and patient reports that she feels better. She continues to be without fever, leukocytosis or signs of systemic infection. Erythema appears to be receding from demarcation line drawn last night. No evidence of abscess, necrosis, or compartment syndrome.  -Will continue IV vancomycin as pt appears to be responding well.  2. Type 1 diabetes  Patient is brittle diabetic. Will continue home lantus dose and provide sliding scale coverage.  - CBGs w meals and qhs   3. CHF  Recent EF 30% in January. Stable, no evidence of volume overload on examination. Pt w/o chest pain or SOB. Followed by [facility] ([name])  - Continue home lasix, metoprolol  - IVF KVO only  4. HTN  Normotensive.  - Continue home antihypertensives  Dispo: Disposition is deferred at this time, awaiting improvement of current medical  problems.  Anticipated discharge in approximately 1 day(s).   The patient does have current PCP ([facility], Brien Few, MD), therefore will be require OPC follow-up after discharge.   The patient does have transportation limitations that hinder transportation to clinic appointments.  .Services Needed at time of discharge: Y = Yes, Blank = No PT:   OT:   RN:   Equipment:   Other:     LOS: 1 day   Bronson Curb 08/29/2012, 8:52 AM

## 2012-08-29 NOTE — Progress Notes (Signed)
Brief Nutrition Note:   RD pulled to pt for unintentional weight loss of 25 lbs in a few months. Pt indicates that some weight loss was r/t fluids from when she was previously in the hospital. Pt also indicates dietary changes, limiting snacking and decreasing portion sizes.   Weight loss is appropriate for this pt.  Body mass index is 26.61 kg/(m^2). BMI meets criteria for overweight.  Currently not eating 100% of meals, but states that we are " giving her too much food" Pt follows a carb mod low diet at home, here she is on carb mod medium.    No nutrition needs at this time. Please consult as needed.   Clarene Duke RD, LDN Pager [phone] After Hours pager [phone]

## 2012-08-29 NOTE — Progress Notes (Signed)
Internal Medicine Attending  Date: 08/29/2012  Patient name: [name] Medical record number: [id number] Date of birth: 1946/06/15 Age: 66 y.o. Gender: female  I saw and evaluated the patient, and discussed her care with resident Dr. Heloise Beecham. I reviewed the resident's note by Dr. Heloise Beecham and I agree with the findings and plans as documented in her note.  Patient's left leg swelling and pain are improving, although still present; she still is quite tender on the posterior aspect of her lower leg.  Agree with plans to continue IV vancomycin and follow for further improvement.  Dr. Dalphine Handing will take over as attending physician tomorrow 08/30/2012.

## 2012-08-30 DIAGNOSIS — L02419 Cutaneous abscess of limb, unspecified: Principal | ICD-10-CM

## 2012-08-30 LAB — CBC
Hemoglobin: 10.9 g/dL — ABNORMAL LOW (ref 12.0–15.0)
MCH: 30.1 pg (ref 26.0–34.0)
Platelets: 208 10*3/uL (ref 150–400)
RBC: 3.62 MIL/uL — ABNORMAL LOW (ref 3.87–5.11)
WBC: 6.6 10*3/uL (ref 4.0–10.5)

## 2012-08-30 LAB — BASIC METABOLIC PANEL
CO2: 24 mEq/L (ref 19–32)
Calcium: 9.2 mg/dL (ref 8.4–10.5)
Chloride: 102 mEq/L (ref 96–112)
Glucose, Bld: 247 mg/dL — ABNORMAL HIGH (ref 70–99)
Potassium: 4.7 mEq/L (ref 3.5–5.1)
Sodium: 135 mEq/L (ref 135–145)

## 2012-08-30 MED ORDER — SULFAMETHOXAZOLE-TMP DS 800-160 MG PO TABS: 2.0000 | ORAL_TABLET | Freq: Two times a day (BID) | ORAL | Status: DC

## 2012-08-30 MED ORDER — SULFAMETHOXAZOLE-TMP DS 800-160 MG PO TABS
1.0000 | ORAL_TABLET | Freq: Two times a day (BID) | ORAL | Status: DC
  Filled 2012-08-30 (×2): qty 1

## 2012-08-30 NOTE — Progress Notes (Signed)
Subjective: No acute events overnight, states that cellulitis is much improved, ready to go home for college reunion with friends  Objective: Vital signs in last 24 hours: Filed Vitals:   08/28/12 2148 08/29/12 0635 08/29/12 1929 08/30/12 0649  BP: 153/54 147/50 140/52 159/52  Pulse: 68 67 66 79  Temp:  98.3 F (36.8 C) 98.5 F (36.9 C) 97.9 F (36.6 C)  TempSrc:   Oral   Resp: 20 17 15 18   Height:      Weight:      SpO2: 99% 100% 99% 100%   Weight change:   Intake/Output Summary (Last 24 hours) at 08/30/12 1006 Last data filed at 08/30/12 0650  Gross per 24 hour  Intake 1459.33 ml  Output   1650 ml  Net -190.67 ml   Physical Exam: General: sitting at bedside window, NAD  Pulm: normal work of breathing Ext:1-2+ edema of L lower extremity, Erythema of L LE has receded from demarcation line drawn 08/29/12. No abscess or necrosis. RLE w chronic stasis changes but no edema or erythema. Multiple surgical scars bilaterally from L peripheral bypass and R sided vein harvesting.  Neuro: alert and oriented X3, cranial nerves II-XII grossly intact, strength and sensation to light touch equal in bilateral upper and lower extremities  Lab Results: Basic Metabolic Panel:  Lab 08/30/12 4098 08/29/12 0524  NA 135 134*  K 4.7 4.4  CL 102 101  CO2 24 27  GLUCOSE 247* 126*  BUN 33* 27*  CREATININE 1.15* 1.23*  CALCIUM 9.2 9.4  MG -- --  PHOS -- --   CBC:  Lab 08/30/12 0650 08/29/12 0524 08/24/12 1903  WBC 6.6 7.2 --  NEUTROABS -- -- 6.2  HGB 10.9* 10.9* --  HCT 32.2* 32.4* --  MCV 89.0 88.5 --  PLT 208 218 --   CBG:  Lab 08/29/12 2236 08/29/12 1700 08/29/12 1203 08/29/12 0743 08/28/12 2230 08/28/12 1836  GLUCAP 146* 223* 166* 119* 112* 204*    Micro Results: Recent Results (from the past 240 hour(s))  MRSA PCR SCREENING     Status: Abnormal   Collection Time   08/28/12  8:31 PM      Component Value Range Status Comment   MRSA by PCR POSITIVE (*) NEGATIVE Final     Studies/Results: No results found. Medications: I have reviewed the patient's current medications. Scheduled Meds:    . aspirin EC  325 mg Oral Daily  . atorvastatin  40 mg Oral QHS  . brimonidine  1 drop Both Eyes BID  . Chlorhexidine Gluconate Cloth  6 each Topical Q0600  . cyanocobalamin  1,000 mcg Intramuscular Q30 days  . docusate sodium  100 mg Oral Daily  . ezetimibe  10 mg Oral QHS  . ferrous sulfate  325 mg Oral Q breakfast  . furosemide  40-60 mg Oral Daily  . heparin  5,000 Units Subcutaneous Q8H  . influenza  inactive virus vaccine  0.5 mL Intramuscular Once  . insulin aspart  0-5 Units Subcutaneous QHS  . insulin aspart  0-9 Units Subcutaneous TID WC  . [COMPLETED] insulin aspart  5 Units Subcutaneous Once  . insulin glargine  10 Units Subcutaneous QHS  . insulin glargine  20 Units Subcutaneous q morning - 10a  . isosorbide mononitrate  30 mg Oral QHS  . latanoprost  1 drop Both Eyes QHS  . levothyroxine  125 mcg Oral QAC breakfast  . metoprolol succinate  25 mg Oral Daily  . mupirocin ointment  1 application Nasal BID  . vancomycin  1,250 mg Intravenous Q24H  . [DISCONTINUED] docusate sodium  100 mg Oral BID  . [DISCONTINUED] docusate sodium  50 mg Oral BID   Continuous Infusions:    . sodium chloride 20 mL/hr at 08/28/12 1825   PRN Meds:.acetaminophen, acetaminophen, oxyCODONE-acetaminophen Assessment/Plan: Ms. Bardales is a 66 year old female with multiple medical problems including CHF, brittle type 1 diabetes, peripheral vascular disease s/p LE bypass surgery, coronary artery disease status post CABG x4, and history of right lower extremity cellulitis presenting with persistent complaints of left lower extremity redness, swelling, erythema.  1. Cellulitis: continues to improve with resolution of erythema, no leukocytosis, s/p Vancomycin x 2 days on this admission with total 4 day course IV Vancomycin including that received in ED prior to  admission. -discharge home on 14 day course Bactrim DS   Lab 08/30/12 0650 08/29/12 0524 08/28/12 1430 08/24/12 1903  WBC 6.6 7.2 8.1 8.4    2. Type 1 Diabetes Mellitus: managed with home regimen of lantus dose and sliding scale coverage.  - CBGs w meals and qhs   CBG (last 3)   Basename 08/29/12 2236 08/29/12 1700 08/29/12 1203  GLUCAP 146* 223* 166*     3. Congestive Heart Failure, combined systolic and diastolic: Echo 10/2011: Mild LVH, EF 30%, apex akinetic, septal HK, grade 2 diastolic dysfunction, or functionally bicuspid aortic valve, mild aortic stenosis by gradient, severe by calculated AVA-suspect A. Aortic stenosis probably moderate, trivial MR, mild LAE, mild RAE. Stable on home lasix regimen and metoprolol, no evidence of volume overload during admission; followed by [name] of [facility] - Continue home lasix, metoprolol  - cont gentle IV Fluids  4. Hypertension: stable on home regimen  Dispo: stable for discharge today with follow-up with PCP 09/02/2012.  The patient does have current PCP ([facility], Brien Few, MD), therefore will be require OPC follow-up after discharge.   The patient does have transportation limitations that hinder transportation to clinic appointments.  .Services Needed at time of discharge: Y = Yes, Blank = No PT:   OT:   RN:   Equipment:   Other:     LOS: 2 days   Jancy Sprankle 08/30/2012, 10:06 AM

## 2012-09-01 ENCOUNTER — Encounter: Payer: Self-pay | Admitting: Internal Medicine

## 2012-09-01 ENCOUNTER — Telehealth: Payer: Self-pay | Admitting: Internal Medicine

## 2012-09-01 LAB — GLUCOSE, CAPILLARY
Glucose-Capillary: 219 mg/dL — ABNORMAL HIGH (ref 70–99)
Glucose-Capillary: 157 mg/dL — ABNORMAL HIGH (ref 70–99)

## 2012-09-01 NOTE — Discharge Summary (Signed)
As noted above, agree with plan of care. Thanks

## 2012-09-01 NOTE — Telephone Encounter (Signed)
  INTERNAL MEDICINE RESIDENCY PROGRAM After-Hours Telephone Call    Reason for call:   I received a call from [name] at approximately 5:30-6:00PM indicating that since starting her Bactrim DS for her cellulitis (2 days ago), she has experienced worsening shortness of breath both at rest and with exertion. She HAS NOT experienced associated throat closing, swelling, orthopnea, PND, chest pain, palpitations. She checks her weights daily and has noted a 1 lb weight loss since discharge on 08/30/2012.     Pertinent Data:   11/14-11/16/2013 - admitted to [facility] for cellulitis of left leg with 4 day course of Vancomycin and with subsequent Bactrim DS at time of discharge.    Assessment / Plan / Recommendations:   Unclear cause of her shortness of breath, possibly related to her CHF, although no clear indication of exacerbation given weight loss (this is on different scales, therefore may not be accurate)  Does not seem to be adverse reaction to bactrim as this is a very rare reaction, and no other indication of adverse reaction.   However, patient is informed that she should present to the ER for further evaluation given presenting symptoms and allow for evaluation (given I am unable to examine or obtain labs).  She states she will try to wait until her clinic appointment tomorrow, and will present to ED if sx worsen.    Priscella Mann, DO   09/01/2012, 8:40 PM

## 2012-09-02 ENCOUNTER — Encounter: Payer: Self-pay | Admitting: Internal Medicine

## 2012-09-02 ENCOUNTER — Ambulatory Visit (HOSPITAL_COMMUNITY)
Admission: RE | Admit: 2012-09-02 | Discharge: 2012-09-02 | Disposition: A | Discharge status: Home / Self Care | Payer: Medicare Other | Source: Ambulatory Visit | Attending: Internal Medicine | Admitting: Internal Medicine

## 2012-09-02 ENCOUNTER — Ambulatory Visit (INDEPENDENT_AMBULATORY_CARE_PROVIDER_SITE_OTHER): Payer: Medicare Other | Admitting: Internal Medicine

## 2012-09-02 VITALS — BP 100/53 | HR 67 | Temp 97.7°F | Ht 64.0 in | Wt 163.2 lb

## 2012-09-02 DIAGNOSIS — L02419 Cutaneous abscess of limb, unspecified: Secondary | ICD-10-CM

## 2012-09-02 DIAGNOSIS — I509 Heart failure, unspecified: Secondary | ICD-10-CM | POA: Insufficient documentation

## 2012-09-02 DIAGNOSIS — E109 Type 1 diabetes mellitus without complications: Secondary | ICD-10-CM | POA: Diagnosis not present

## 2012-09-02 DIAGNOSIS — Z1331 Encounter for screening for depression: Secondary | ICD-10-CM | POA: Diagnosis not present

## 2012-09-02 DIAGNOSIS — R0602 Shortness of breath: Secondary | ICD-10-CM | POA: Diagnosis not present

## 2012-09-02 DIAGNOSIS — L03119 Cellulitis of unspecified part of limb: Secondary | ICD-10-CM

## 2012-09-02 DIAGNOSIS — R9431 Abnormal electrocardiogram [ECG] [EKG]: Secondary | ICD-10-CM | POA: Diagnosis not present

## 2012-09-02 DIAGNOSIS — L03116 Cellulitis of left lower limb: Secondary | ICD-10-CM

## 2012-09-02 DIAGNOSIS — Z0000 Encounter for general adult medical examination without abnormal findings: Secondary | ICD-10-CM | POA: Diagnosis not present

## 2012-09-02 LAB — BASIC METABOLIC PANEL WITH GFR
BUN: 40 mg/dL — ABNORMAL HIGH (ref 6–23)
Calcium: 9.2 mg/dL (ref 8.4–10.5)
Chloride: 100 mEq/L (ref 96–112)
Creat: 1.83 mg/dL — ABNORMAL HIGH (ref 0.50–1.10)
GFR, Est African American: 32 mL/min — ABNORMAL LOW
GFR, Est Non African American: 28 mL/min — ABNORMAL LOW

## 2012-09-02 LAB — PRO B NATRIURETIC PEPTIDE: Pro B Natriuretic peptide (BNP): 6188 pg/mL — ABNORMAL HIGH (ref <126)

## 2012-09-02 MED ORDER — FUROSEMIDE 20 MG PO TABS: 60.0000 mg | ORAL_TABLET | Freq: Every day | ORAL | Status: DC

## 2012-09-02 NOTE — Progress Notes (Addendum)
Patient ID: [name], female   DOB: Mar 01, 1946, 66 y.o.   MRN: [id number]  Subjective:   Patient ID: [name] female   DOB: Dec 28, 1945 66 y.o.   MRN: [id number]  HPI: [name] is a 66 y.o. with a past medical history as outlined below, who presents for a hospital followup visit.  Patient was recently hospitalized from a 11/14-11/62 2 left lower leg cellulitis. Patient was diagnosed with left lower extremity cellulitis with a negative Doppler for DVT. Patient has history of severe peripheral vascular disease,  s/p LE bypass surgery and transmetatarsal amputation of L foot, and history of right lower extremity cellulitis. Patient was treated with IV vancomycin in hospital and was discharged on oral for 14 days. Patient condition was significantly improved at the discharge. She is currently taking Bactrim. She reports that her leg pain and leg swelling improved significantly. Currently she only has mild tenderness and mild swelling in left lower extremity. She doesn't have fever or chills.  Patient also reports that she started having shortness of breath since 11//16. She does not have cough, chest pain or palpitation. She reports that her body weight increased by nearly 8 pounds recently. She has mild PND, but no orthopnea. She is currently taking Lasix 40 mg daily for 5 days and 60 mg daily for 2 days per week. Her recent echo (10/28/11) showed EF 30% with grade 2 diastolic dysfunction.   Regarding her diabetes, patient's is taking Lantus 20 units in the morning and 10 units in the evening plus sliding scale insulin. She got her A1c checked at Dr. Willeen Cass office at 06/20/12, which was 8.1 (last A1c was 10.2 at 11/06/11. Patient does not have symptoms for hypoglycemia or hyperglycemic.    Past Medical History  Diagnosis Date  . HTN (hypertension)   . Hypothyroidism   . History of recurrent UTIs   . CAD (coronary artery disease)     s/p CABG x3 in 1993; last cath in  2010  . Scleroderma   . Bilateral carpal tunnel syndrome 1970s  . 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 Followed by [name]  . Claudication   . Aortic stenosis     0.8cm2 on echo in 10/2011  . Hyperlipidemia     on Lipitor  . Cat bite 2009    with MRSA; required I & D  . Heart murmur   . Blood transfusion     no reaction from transfusion; "when I had heart surgery" (08/28/2012)  . GERD (gastroesophageal reflux disease)   . CHF (congestive heart failure) Aug. 2012    Echo 10/2011: Mild LVH, EF 30%, apex akinetic, septal HK, grade 2 diastolic dysfunction, or functionally bicuspid aortic valve, mild aortic stenosis by gradient, severe by calculated AVA-suspect A. Aortic stenosis probably moderate, trivial MR, mild LAE, mild RAE.   [name] Cellulitis   . Anemia   . B12 deficiency anemia     B 12 injection "monthly since 05/2012" (08/28/2012)  . Iron deficiency anemia 06/13/2012    "iron infusion" (08/28/2012  . Anginal pain     "history; not currently" (08/28/2012)  . Myocardial infarction 1986    "silent"  . History of bronchitis     "I've had it twice in my life" (08/28/2012)  . Exertional dyspnea     "because of the heart failure" (08/28/2012)  . Type 1 diabetes 10/1949  . Stomach ulcer  1981?    "on Tagament for ~ 1 yr" (08/28/2012)  . Seizure     ? seizure like event in 2010; workup included stress testing which led to repeat cath.   . Arthritis     "hands, hips, all over" (08/28/2012  . Osteoarthritis of both feet   . Breast cancer     left   Current Outpatient Prescriptions  Medication Sig Dispense Refill  . acetaminophen (TYLENOL) 500 MG tablet Take 1,000 mg by mouth every 6 (six) hours as needed. For pain/fever      . aspirin EC 325 MG EC tablet Take 325 mg by mouth daily.        [name] atorvastatin (LIPITOR) 40 MG tablet Take 1 tablet (40 mg total) by mouth at bedtime.  30 tablet  11  . brimonidine (ALPHAGAN)  0.2 % ophthalmic solution Place 1 drop into both eyes 2 (two) times daily.        . cyanocobalamin (,VITAMIN B-12,) 1000 MCG/ML injection Inject 1,000 mcg into the muscle every 30 (thirty) days.      . diphenhydrAMINE (BENADRYL) 25 mg capsule Take 25 mg by mouth every 6 (six) hours as needed. For cold symptoms      . docusate sodium (COLACE) 50 MG capsule Take by mouth 2 (two) times daily.      [name] ezetimibe (ZETIA) 10 MG tablet Take 1 tablet (10 mg total) by mouth daily.  30 tablet  6  . Ferrous Sulfate (SLOW RELEASE IRON) 50 MG TBCR Take 50 mg by mouth daily.       . furosemide (LASIX) 20 MG tablet Take 3 tablets (60 mg total) by mouth daily.  90 tablet  0  . ibuprofen (ADVIL,MOTRIN) 200 MG tablet Take 400 mg by mouth every 6 (six) hours as needed. For pain      . insulin glargine (LANTUS) 100 UNIT/ML injection Take 20 units subcutaneously each morning and 10 units each evening.      . insulin lispro (HUMALOG PEN) 100 UNIT/ML injection Inject 0-9 Units into the skin 4 (four) times daily. Per sliding scale DO NOT GIVE WITHOUT CONSULTING PATIENT ON HER SLIDING SCALE      . isosorbide mononitrate (IMDUR) 30 MG 24 hr tablet Take 30 mg by mouth daily.      [name] latanoprost (XALATAN) 0.005 % ophthalmic solution Place 1 drop into both eyes at bedtime.        [name] levothyroxine (SYNTHROID, LEVOTHROID) 125 MCG tablet Take 125 mcg by mouth daily.       . metoprolol succinate (TOPROL-XL) 25 MG 24 hr tablet Take 1 tablet (25 mg total) by mouth daily.  30 tablet  11  . nitroGLYCERIN (NITROSTAT) 0.4 MG SL tablet Place 0.4 mg under the tongue every 5 (five) minutes as needed. For chest pain      . sulfamethoxazole-trimethoprim (BACTRIM DS) 800-160 MG per tablet Take 2 tablets by mouth every 12 (twelve) hours.  56 tablet  0  . [DISCONTINUED] nitroGLYCERIN (NITROSTAT) 0.4 MG SL tablet Place 1 tablet (0.4 mg total) under the tongue every 5 (five) minutes as needed for chest pain.  25 tablet  3   Family History  Problem  Relation Age of Onset  . Diabetes Mother    History   Social History  . Marital Status: Single    Spouse Name: N/A    Number of Children: N/A  . Years of Education: N/A   Occupational History  . Environmental health practitioner  Social History Main Topics  . Smoking status: Never Smoker   . Smokeless tobacco: Never Used  . Alcohol Use: No  . Drug Use: No  . Sexually Active: Not Currently    Birth Control/ Protection: Post-menopausal   Other Topics Concern  . None   Social History Narrative  . None   Review of Systems:  General: no fevers, chills, no changes in body weight, no changes in appetite Skin: no rash HEENT: no blurry vision, hearing changes or sore throat Pulm: has mild SOB. No coughing or wheezing CV: no chest pain, palpitations, shortness of breath Abd: no nausea/vomiting, abdominal pain, diarrhea/constipation GU: no dysuria, hematuria, polyuria Ext: mild pain in left lower leg Neuro: no weakness, numbness, or tingling   Objective:  Physical Exam: Filed Vitals:   09/02/12 1356  BP: 100/53  Pulse: 67  Temp: 97.7 F (36.5 C)  TempSrc: Oral  Height: 5\' 4"  (1.626 m)  Weight: 163 lb 3.2 oz (74.027 kg)  SpO2: 97%   General: resting in bed, not in acute distress   HEENT: PERRL, EOMI, no scleral icterus   Cardiac: S1/S2, 2/6 systolic murmur left sternal border; no S3, no S4, RRR, No murmurs, gallops or rubs   Pulm: Good air movement bilaterally, Clear to auscultation bilaterally, No rales, wheezing, rhonchi or rubs.   Abd: Soft, nondistended, nontender, no rebound pain, no organomegaly, BS present   Ext: There is mild tenderness, but no swelling over left lower leg. Right lower leg has 1+ pitting edema. Neuro: alert and oriented X3,    Assessment & Plan:   Addendum: patient's elevated Cre is most likely due to the inhibition of creatinine secretion by TMP, but since Lasix was added to her regimen, I will check her BMP again today (09/04/12) to make sure  her renal function is OK. If her Cre is worse, may consider to switch bactrim to doxycycline. I will ask front desk to give patient an appointment today.   Addendum:   Patient's Cre is elevated slightly above the baseline. Her Pro-BNP is elevated at 6188 which is consistent with CHF exacerbation. We already increased the patient's Lasix dose to 60 mg daily. Patient will come back at 09/05/12 for reevaluation. We need to check her BMP again.

## 2012-09-02 NOTE — Assessment & Plan Note (Addendum)
Patient's diabetes is better controlled. Her A1c decreased from 10.2 on 04/05/12 to 8.1 on 06/20/12 (checked at Dr. Willeen Cass office on 06/20/12 per pt). Currently patient is taking Lantus 20 units in each morning and 10 units in each evening) and a sliding scale insulin. We'll continue current regimen and follow up.

## 2012-09-02 NOTE — Assessment & Plan Note (Addendum)
Patient reports having shortness of breath which started on 11/16. She also reports having increased body weight by about 8 pounds recently. She denies cough, chest pain, palpitation. She has PND, but no orthopnea. It is most likely caused by mild congestive heart failure. Other differential diagnoses include, but less likeley,  ACS ( unlikely, we did stat EKG at clinic. Patient has T wave inversion in lateral leads which has not changed compared to previous EKG at 11/16/11); PE (unlikely, patient does not have chest pain, no tachycardia, oxygen saturation at 96% on room air). Currently patient is taking Lasix (40 mg daily for 5 days and 60 mg daily for 2 days per week).  -will increase lasix dose to 60 mg daily.  -will ask patient come back on 09/05/12 for evaluation -Will check proBNP and BMP today.

## 2012-09-02 NOTE — Assessment & Plan Note (Signed)
Patient's left leg cellulitis is recovering well. Currently she has minimal left leg swelling and mild tenderness. There is no warmth on physical examination in left lower extremity. Patient does not have fever or chills. Will let patient continue to complete 2 week course of Bactrim.

## 2012-09-02 NOTE — Assessment & Plan Note (Signed)
-   Did foot examination. No ulcers. Has week DP/PT pulse bilaterally.  -Patient refused pneumococcal vaccination, ophthalmology examination, urine microalbumin, tetanus shot, mammogram, colonoscopy, zoster back, Flu shot, will postpone. She would like to wait until her shortness of breath completely resolved.  -Patient had A1c checked at Dr. Willeen Cass office on 06/20/12, will not repeat it today.

## 2012-09-02 NOTE — Patient Instructions (Signed)
1. Your shortness of breath is most likely caused by mild exacerbation of congestive heart failure. Please start taking Lasix 60 mg daily, and come back on Friday to be reevaluated. Please come back to the hospital if your symptoms get worse. 2. Please take all medications as prescribed.  3. If you have worsening of your symptoms or new symptoms arise, please call the clinic ([phone]), or go to the ER immediately if symptoms are severe.  You have done great job in taking all your medications. I appreciate it very much. Please continue doing that.

## 2012-09-04 ENCOUNTER — Ambulatory Visit (HOSPITAL_COMMUNITY)
Admission: RE | Admit: 2012-09-04 | Discharge: 2012-09-04 | Disposition: A | Discharge status: Home / Self Care | Payer: Medicare Other | Source: Ambulatory Visit | Attending: Internal Medicine | Admitting: Internal Medicine

## 2012-09-04 ENCOUNTER — Ambulatory Visit: Payer: Medicare Other | Admitting: Internal Medicine

## 2012-09-04 ENCOUNTER — Ambulatory Visit (INDEPENDENT_AMBULATORY_CARE_PROVIDER_SITE_OTHER): Payer: Medicare Other | Admitting: Internal Medicine

## 2012-09-04 ENCOUNTER — Other Ambulatory Visit: Payer: Self-pay | Admitting: Internal Medicine

## 2012-09-04 ENCOUNTER — Encounter: Payer: Self-pay | Admitting: Internal Medicine

## 2012-09-04 ENCOUNTER — Telehealth: Payer: Self-pay | Admitting: Internal Medicine

## 2012-09-04 VITALS — BP 147/58 | HR 61 | Temp 97.0°F | Wt 160.8 lb

## 2012-09-04 DIAGNOSIS — N179 Acute kidney failure, unspecified: Secondary | ICD-10-CM | POA: Diagnosis not present

## 2012-09-04 DIAGNOSIS — E875 Hyperkalemia: Secondary | ICD-10-CM | POA: Insufficient documentation

## 2012-09-04 DIAGNOSIS — R0602 Shortness of breath: Secondary | ICD-10-CM | POA: Insufficient documentation

## 2012-09-04 DIAGNOSIS — I498 Other specified cardiac arrhythmias: Secondary | ICD-10-CM | POA: Insufficient documentation

## 2012-09-04 DIAGNOSIS — L03119 Cellulitis of unspecified part of limb: Secondary | ICD-10-CM | POA: Diagnosis not present

## 2012-09-04 DIAGNOSIS — L02419 Cutaneous abscess of limb, unspecified: Secondary | ICD-10-CM | POA: Diagnosis not present

## 2012-09-04 DIAGNOSIS — J90 Pleural effusion, not elsewhere classified: Secondary | ICD-10-CM | POA: Diagnosis not present

## 2012-09-04 DIAGNOSIS — L03116 Cellulitis of left lower limb: Secondary | ICD-10-CM

## 2012-09-04 DIAGNOSIS — I509 Heart failure, unspecified: Secondary | ICD-10-CM

## 2012-09-04 LAB — BASIC METABOLIC PANEL WITH GFR
BUN: 47 mg/dL — ABNORMAL HIGH (ref 6–23)
Calcium: 9.5 mg/dL (ref 8.4–10.5)
Creat: 2.16 mg/dL — ABNORMAL HIGH (ref 0.50–1.10)
GFR, Est African American: 27 mL/min — ABNORMAL LOW
GFR, Est Non African American: 23 mL/min — ABNORMAL LOW

## 2012-09-04 LAB — PRO B NATRIURETIC PEPTIDE: Pro B Natriuretic peptide (BNP): 8366 pg/mL — ABNORMAL HIGH (ref <126)

## 2012-09-04 MED ORDER — SODIUM POLYSTYRENE SULFONATE 15 GM/60ML PO SUSP: 30.0000 g | Freq: Once | ORAL | Status: DC

## 2012-09-04 NOTE — Assessment & Plan Note (Signed)
In the setting of CKD, likely stage 3  - Held lasix - Stop bactrim, re-start clindamycin per recent outpatient prescription - Labs tomorrow - Advised patient to eat and drink as normal, focusing on water for PO intake.

## 2012-09-04 NOTE — Progress Notes (Signed)
Subjective:    Patient ID: [name], female    DOB: 1946/03/06, 66 y.o.   MRN: [id number]  CC: Asked to come in to recheck Creatinine  HPI [name] is a 66yo woman who presents for the above, however, she also notes that she has been having increasing shortness of breath since being discharged from the hospital.  She notes that her symptoms started on Saturday and that since being discharged from the hospital that she has been having progressive DOE, difficulty climbing the stairs and mild PND.  This has been limiting to her lifestyle in that she has had to cancel events.  She also reports imbalance and feeling like her equilibrium is messed up. She denies cough, fever, chills, recent illness, sick contacts, night sweats, rhinorrhea, sinus congestion, chest pressure/tightness, chest pain, wheezing.  She is a non smoker and denies any history of lung disease.   She notes that she believes her symptoms are related to starting Bactrim.  She wishes to change this medication, and she notes that she had clindamycin at home from a previous Rx.  Patient normally takes Lasix 40 mg daily, but this has been increased recently to 60mg  daily by her cardiologist.    Her main issue for coming in today was increased Cr after starting bactrim.  She is willing to get a BMP today.  She further has a LLE cellulitis which has been treated with the Bactrim.  Her leg swelling and erythema are greatly improved.  She has intermittent LE swelling due to surgeries to the leg and heart failure, however, it was greatly increased due to the cellulitis.  This is improved.   Further symptoms include abdominal pain, cramps, pruritis and the what she describes as a yeast infection due to the antibiotics.   Social history: Non smoker, lives alone, using walker.   PMH: Chronic systolic heart failure, EF of 40%, HLD, Aortic stenosis, moderate, CKD stage 3, HTN, PVD  Review of Systems  Constitutional: Positive for  activity change. Negative for fever, chills and fatigue.  HENT: Negative for hearing loss, ear pain, congestion, sore throat, rhinorrhea, sneezing, trouble swallowing, sinus pressure and ear discharge.   Eyes: Negative for pain and visual disturbance.  Respiratory: Positive for cough and shortness of breath. Negative for choking, chest tightness and wheezing.   Cardiovascular: Positive for leg swelling. Negative for chest pain and palpitations.  Gastrointestinal: Negative for nausea, vomiting, diarrhea, constipation and abdominal distention.  Genitourinary: Negative for dysuria and difficulty urinating.  Musculoskeletal: Negative for back pain, arthralgias and gait problem.  Skin: Positive for color change. Negative for rash.  Neurological: Positive for dizziness. Negative for seizures, syncope and light-headedness.  Psychiatric/Behavioral: Negative for behavioral problems and decreased concentration.       Objective:   Physical Exam  Constitutional: She is oriented to person, place, and time. She appears well-developed and well-nourished. No distress.  HENT:  Head: Normocephalic and atraumatic.  Eyes: EOM are normal. Right eye exhibits no discharge. Left eye exhibits no discharge. No scleral icterus.  Cardiovascular: Normal rate and regular rhythm.  Exam reveals no friction rub.   Murmur heard. Pulmonary/Chest: Effort normal and breath sounds normal. No respiratory distress. She has no wheezes. She has no rales. She exhibits no tenderness.  Abdominal: Soft. Bowel sounds are normal. She exhibits no distension.  Musculoskeletal: She exhibits edema and tenderness.  Neurological: She is alert and oriented to person, place, and time.  Skin: Skin is warm and dry. No rash noted.  She is not diaphoretic. There is erythema.  Psychiatric: She has a normal mood and affect. Her behavior is normal.   Labs:   BUN/Cr  47/2.16  K 6.0  BNP 8366     Assessment & Plan:  Please see problem based  charting.

## 2012-09-04 NOTE — Assessment & Plan Note (Addendum)
Patient with history of systolic heart failure, recently with increased lasix dose and worsening creatinine. Patient attributes SOB to bactrim use, started recently for cellulitis.  Concerns for her SOB would be worsening heart failure, intrinsic lung disease, ? Medication effect, DVT/PE, worsening aortic stenosis or deconditioning from hospital stay.   For DVT/PE - Her wells score is 0, which is low probability.  Her BNP is elevated and CXR as noted below.  She is not interested in making changes to her Cardiac meds, as she is seen by her Cardiologist for this.  Per the most recent note, her AS is likely moderate and is being followed by that physician.   She sees her cardiologist on 09/15/2012.   Plan CXR today - revealed mild pleural effusions PFTs - future Consider PT if other etiologies ruled out for deconditioning Consider repeat TTE to evaluate AS worsening, patient wished to defer decision until discussion with Cardiology Stop Bactrim Currently asked her to hold increased lasix for today, despite pleural effusions, because of renal function  She will be seen back tomorrow for repeat labs and evaluatione

## 2012-09-04 NOTE — Assessment & Plan Note (Addendum)
Due to acute renal failure in the setting of CKD stage 3  - This is likely due to medication effect (increased lasix and Bactrim) - Bactrim will be discontinued, lasix held - Kayexalate 30g X 1 tonight, re-evaluate tomorrow at 3:30 lab appointment - EKG ordered, no peaked twaves noted  Patient declined overnight observation and treatment in the hospital, very close follow up for tomorrow in clinic.

## 2012-09-04 NOTE — Telephone Encounter (Signed)
New Problem:    Patient called in wanting to speak with you about some information she received from her other physicians.  Please call back before 1:00pm.

## 2012-09-04 NOTE — Progress Notes (Addendum)
Subjective:     Patient ID: [name], female   DOB: 11-Sep-1946, 66 y.o.   MRN: [id number]  HPI 66 yo female with an extensive PMH including but not limited to CAD, Chronic Kidney Disease, CHF who was recently discharged from the hospital with left LE cellulitis on Bactrim presents to the clinic for follow up for renal function. The left leg cellulitis has improved, pain is still present on palpation, and erythema is present, but is improving. Patient is complaining of increased shortness of breath since discharge, on even minimal exertion. Before recent hospital admission, patient was able to walk a short flight of stairs and walk around the house without any problems. Patient now uses a walker for even short distance activity because of the SOB. She also complains of imbalance and "disequilibrium". Patient denies fever, is urinating at her baseline, but says she is drinking more water than normal because she needs the extra water to swallow her Bactrim pills. Patient is well aware of symptoms of dehydration because she has experienced it before, but says she does not feel dehydrated at this time. Patient also has complaints of abdominal pain/cramps, diffuse pruritis, and "the beginning of a yeast infection" likely 2/2 the bactrim according to the patient.   Patient normally takes Lasix 40mg  daily and twice a week takes 60mg , as per recommendation from her cardiologist. After seeing Dr. Clyde Lundborg on Tuesday (09/02/12), it was recommended to increase the twice a week dose from 60mg  to 80mg . Patient took 80mg  of Lasix on 09/03/12, but has not taken any Lasix today (09/04/12). A BMP is pending to assess renal function.   Past Medical History  Diagnosis Date  . HTN (hypertension)   . Hypothyroidism   . History of recurrent UTIs   . CAD (coronary artery disease)     s/p CABG x3 in 1993; last cath in 2010  . Scleroderma   . Bilateral carpal tunnel syndrome 1970s  . PVD (peripheral vascular disease)       Toes amputated from left foot and has had prior bypass on left leg. Followed by [name]  . Claudication   . Aortic stenosis     0.8cm2 on echo in 10/2011  . Hyperlipidemia     on Lipitor  . Cat bite 2009    with MRSA; required I & D  . Heart murmur   . Blood transfusion     no reaction from transfusion; "when I had heart surgery" (08/28/2012)  . GERD (gastroesophageal reflux disease)   . CHF (congestive heart failure) Aug. 2012    Echo 10/2011: Mild LVH, EF 30%, apex akinetic, septal HK, grade 2 diastolic dysfunction, or functionally bicuspid aortic valve, mild aortic stenosis by gradient, severe by calculated AVA-suspect A. Aortic stenosis probably moderate, trivial MR, mild LAE, mild RAE.   [name] Cellulitis   . Anemia   . B12 deficiency anemia     B 12 injection "monthly since 05/2012" (08/28/2012)  . Iron deficiency anemia 06/13/2012    "iron infusion" (08/28/2012  . Anginal pain     "history; not currently" (08/28/2012)  . Myocardial infarction 1986    "silent"  . History of bronchitis     "I've had it twice in my life" (08/28/2012)  . Exertional dyspnea     "because of the heart failure" (08/28/2012)  . Type 1 diabetes 10/1949  . Stomach ulcer 1981?    "on Tagament for ~ 1 yr" (08/28/2012)  . Seizure     ?  seizure like event in 2010; workup included stress testing which led to repeat cath.   . Arthritis     "hands, hips, all over" (08/28/2012  . Osteoarthritis of both feet   . Breast cancer     left   Past Surgical History  Procedure Date  . Carpal tunnel release ~ 1970's    bilaterally  . Mastectomy 11/1982    Left Breast  . Vitrectomy 1990's-2004    "both eyes; I've had total of 4"  . Tubal ligation 1984  . Tonsillectomy 1952    "?adenoids"   . Appendectomy 1991  . Femoral bypass     left leg "related to transmetatarsal amputation" (08/28/2012)  . Breast biopsy with sentinel lymph node biopsy and needle localization 1984  . Coronary artery bypass graft 1992     LIMA to LAD, SVG to distal LCX & SVG to Marginal  . Cardiac catheterization 2010    LIMA to LAD patent yet with occluded distal LAD after graft insertion & retrograde is occluded. 3-4 septal perforators arise &are patent. SVG to a large marginal patent; SVG presumably to the distal dominant LCX is occluded, moderately high grade stenosis of the AV circumflex, but with distal stenoses involving a severely diseased marginal branch not amenable  to PCI  nor is inferior branch   . Cardiac catheterization 10/2011    "unsuccessful attempt of stenting" (08/28/2012)  . Cardiac catheterization 1992; 09/2011  . Breast biopsy   . Trigger finger release 1980's    right thumb  . Incision and drainage of wound ~ 1967    "infection in left foot" (08/28/2012)  . Incision and drainage of wound 2009    "3 ORs on my right hand after cat bite" (08/28/2012)  . 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 Cataract extraction w/ intraocular lens  implant, bilateral 1990's   Family History  Problem Relation Age of Onset  . Diabetes Mother    History   Social History  . Marital Status: Single    Spouse Name: N/A    Number of Children: N/A  . Years of Education: N/A   Occupational History  . Environmental health practitioner    Social History Main Topics  . Smoking status: Never Smoker   . Smokeless tobacco: Never Used  . Alcohol Use: No  . Drug Use: No  . Sexually Active: Not Currently    Birth Control/ Protection: Post-menopausal   Other Topics Concern  . Not on file   Social History Narrative  . No narrative on file    Review of Systems  Constitutional: Positive for activity change (2/2 SOB) and appetite change (slight decrease). Negative for fever, chills, fatigue and unexpected weight change.  HENT: Positive for postnasal drip. Negative for sore throat, facial swelling, rhinorrhea, sneezing, neck stiffness and sinus pressure.   Eyes: Negative for redness and itching.   Respiratory: Positive for cough (productive cough, clear phlegm) and shortness of breath. Negative for chest tightness.   Cardiovascular: Positive for leg swelling (chronic 2/2 CHF). Negative for chest pain.  Gastrointestinal: Positive for abdominal pain (cramping (started Tuesday 09/02/2012)). Negative for nausea, vomiting, diarrhea, constipation and blood in stool.  Genitourinary: Negative for frequency and difficulty urinating.  Musculoskeletal: Negative for back pain.  Skin: Positive for color change (some areas of healing wounds 2/2 itching).  Neurological: Positive for dizziness. Negative for headaches.  Psychiatric/Behavioral: Negative.        Objective:   Physical Exam  Constitutional: She is oriented to person, place, and time. She appears well-developed and well-nourished. No distress.  HENT:  Head: Normocephalic and atraumatic.  Eyes: Conjunctivae normal and EOM are normal. Pupils are equal, round, and reactive to light.  Neck: No thyromegaly present.  Cardiovascular: Normal rate and regular rhythm.  Exam reveals no gallop and no friction rub.   Murmur (2/6 systolic) heard. Pulmonary/Chest: Effort normal and breath sounds normal. No respiratory distress. She has no wheezes. She has no rales. She exhibits no tenderness.  Abdominal: Soft. Bowel sounds are normal. There is no tenderness. There is no rebound and no guarding.  Musculoskeletal: Normal range of motion. She exhibits edema (b/l LE) and tenderness (cellulitis of left LE).  Neurological: She is alert and oriented to person, place, and time.  Skin: Rash (cellulitis of LE) noted. She is not diaphoretic. There is erythema (b/l LE).   Filed Vitals:   09/04/12 1358  BP: 147/58  Pulse: 61  Temp:        Assessment/Plan:     66 year old female presents to the clinic after an increase of Cr from baseline for a follow up on renal function. A BMP was ordered and found a continued increase of her BUN and Cr, and a K+ of  6.0. She also complained of severe SOB on minimal exertion since discharge from her hospital admission for cellulitis. 1. Shortness of breath. Likely complicated by her CHF and chronic kidney disease. - CXR ordered. Evidence of pulmonary edema present. - ProBNP ordered and showed an increase since 09/02/12. This can be evidence for worsening CHF vs. Worsening aortic stenosis. Recommended appointment with her cardiologist.  - Will not adjust Lasix regimen as patient is reluctant to do so without recommendation from her cardiologist.  2. Acute on Chronic Kidney Disease BMP ordered. Baseline Cr approximately 1.4, currently at 2.16, with K+ of 6.0 - Kayexalate - EKG ordered. No peaked T waves appreciated.  - Patient declined admission for elevated potassium. Will return tomorrow for a lab only visit.  3. Cellulitis Bactrim side effects for treatment of cellulitis. Complained of abdominal cramps and pruritis - Will change to Clindamycin as patient has a history of tolerating the medication.  Clindamycin 450mg  PO TID x 14 days

## 2012-09-04 NOTE — Assessment & Plan Note (Signed)
Cellulitis improving well.  Have discontinued bactrim due to renal injury as noted, have asked patient to restart clindamycin for which she had previously received a prescription.  If she should have a flare up or worsening of her cellulitis, will change to doxycycline.  Appointment tomorrow as noted in other problems.

## 2012-09-04 NOTE — Telephone Encounter (Signed)
LMOM to call back

## 2012-09-04 NOTE — Patient Instructions (Addendum)
Ms. Torrisi -   You presented for an appointment to check your kidney function.  Your kidney function is  Worse compared to when we saw you two days ago and now your potassium is a little too high.  Since you would prefer not to be admitted to the hospital for observation, you will be sent home with a medication called Kayexalate 30g for one dose tonight.  You will need to come back tomorrow for an appointment at 3:30pm to get your renal function and potassium checked again.  Please also hold your lasix tonight as this can make your kidney function worse.  Please drink fluids including water as you normally do.   You also have shortness of breath which has been getting worse since leaving the hospital.  You have a CXR which shows mild pleural effusions, however, you should not take your lasix right now.  Please speak with your cardiologist about repeated an echo of your heart to evaluate your aortic stenosis (heart murmur) as worsening of this problem could be explaining your shortness of breath.    For your cellulitis, please stop your bactrim and re-start the clindamycin you had been previously prescribed.  Bactrim can also be making your kidney function worse.   Thank you.

## 2012-09-05 ENCOUNTER — Ambulatory Visit: Payer: Medicare Other | Admitting: Internal Medicine

## 2012-09-05 ENCOUNTER — Ambulatory Visit (INDEPENDENT_AMBULATORY_CARE_PROVIDER_SITE_OTHER): Payer: Medicare Other | Admitting: Internal Medicine

## 2012-09-05 ENCOUNTER — Other Ambulatory Visit: Payer: Self-pay | Admitting: Internal Medicine

## 2012-09-05 VITALS — BP 136/57 | HR 65 | Temp 96.8°F | Ht 64.0 in | Wt 160.0 lb

## 2012-09-05 DIAGNOSIS — N179 Acute kidney failure, unspecified: Secondary | ICD-10-CM | POA: Diagnosis not present

## 2012-09-05 DIAGNOSIS — L0390 Cellulitis, unspecified: Secondary | ICD-10-CM

## 2012-09-05 DIAGNOSIS — L0291 Cutaneous abscess, unspecified: Secondary | ICD-10-CM

## 2012-09-05 LAB — BASIC METABOLIC PANEL WITH GFR
BUN: 43 mg/dL — ABNORMAL HIGH (ref 6–23)
Calcium: 9.2 mg/dL (ref 8.4–10.5)
Creat: 1.87 mg/dL — ABNORMAL HIGH (ref 0.50–1.10)
GFR, Est Non African American: 27 mL/min — ABNORMAL LOW

## 2012-09-05 LAB — GLUCOSE, CAPILLARY: Glucose-Capillary: 82 mg/dL (ref 70–99)

## 2012-09-05 MED ORDER — CLINDAMYCIN HCL 150 MG PO CAPS: ORAL_CAPSULE | ORAL | Status: DC

## 2012-09-05 NOTE — Progress Notes (Signed)
Patient ID: [name], female   DOB: 12/21/1945, 66 y.o.   MRN: [id number] Follow up visit for hyperkalemia and increased creatinine secondary to bactrim. Stopping the drug and dose of kayxalate has brought the potassium back to normal. She will increase her creatinine to 80 mg twice a week and 40 mg the rest of the days of the week to decrease her SOB. She is very well aware of her medical co morbidities.

## 2012-09-05 NOTE — Patient Instructions (Addendum)
Please take Clindamycin for a full 14 day course. As you have pills for 7 days, the new prescription will be written for an additional 7 days for a total of a 14 day course.  Please call us if your shortness of breath or cellulitis worsens.   If you feel your heart beating very quickly and you feel dizziness, please call 911.  Take Lasix 40mg  5 days a week and 80mg  twice a week (on Tuesday and Saturday) for the next 2 weeks.  Begin taking your Lasix as prescribed by your cardiologist after 2 weeks.   Thank you for seeing [place] today at the outpatient clinic.  For your information: Lab from today (09/05/12) K+: 4.4 Cr: 1.87 BUN: 43  Please see your PCP for a routine follow up in 3 months.

## 2012-09-05 NOTE — Progress Notes (Signed)
Subjective:     Patient ID: [name], female   DOB: April 24, 1946, 66 y.o.   MRN: [id number]  HPI  66 yo female with an extensive PMH including but not limited to CAD, Chronic Kidney Disease, CHF who was seen at the clinic yesterday (09/04/12) and was told to come back today (09/05/12) because a K+ of 6 was found on her CMP. She was given Kayexalate and advised to get admitted to the hospital, but she refused.  She was recently discharge from the hospital for cellulitis and given Bactrim. She experienced stomach cramps and pruritis 2/2 the Bactrim and has started a course of Clindamycin. She is tolerating the clindamycin thus far.   Patient normally takes Lasix 40mg  daily and twice a week takes 60mg , as per recommendation from her cardiologist. After seeing Dr. Clyde Lundborg on Tuesday (09/02/12), it was recommended to increase the twice a week dose from 60mg  to 80mg . Patient took 80mg  of Lasix on 09/03/12, but has not taken any Lasix for the past 2 days (11/21 and 11/22). The patient's SOB has improved since yesterday and she is now able to walk around her house without the use of her walker. A BMP is pending to recheck her K+.    Past Medical History  Diagnosis Date  . HTN (hypertension)   . Hypothyroidism   . History of recurrent UTIs   . CAD (coronary artery disease)     s/p CABG x3 in 1993; last cath in 2010  . Scleroderma   . Bilateral carpal tunnel syndrome 1970s  . 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 Followed by [name]  . Claudication   . Aortic stenosis     0.8cm2 on echo in 10/2011  . Hyperlipidemia     on Lipitor  . Cat bite 2009    with MRSA; required I & D  . Heart murmur   . Blood transfusion     no reaction from transfusion; "when I had heart surgery" (08/28/2012)  . GERD (gastroesophageal reflux disease)   . CHF (congestive heart failure) Aug. 2012    Echo 10/2011: Mild LVH, EF 30%, apex akinetic,  septal HK, grade 2 diastolic dysfunction, or functionally bicuspid aortic valve, mild aortic stenosis by gradient, severe by calculated AVA-suspect A. Aortic stenosis probably moderate, trivial MR, mild LAE, mild RAE.   [name] Cellulitis   . Anemia   . B12 deficiency anemia     B 12 injection "monthly since 05/2012" (08/28/2012)  . Iron deficiency anemia 06/13/2012    "iron infusion" (08/28/2012  . Anginal pain     "history; not currently" (08/28/2012)  . Myocardial infarction 1986    "silent"  . History of bronchitis     "I've had it twice in my life" (08/28/2012)  . Exertional dyspnea     "because of the heart failure" (08/28/2012)  . Type 1 diabetes 10/1949  . Stomach ulcer 1981?    "on Tagament for ~ 1 yr" (08/28/2012)  . Seizure     ? seizure like event in 2010; workup included stress testing which led to repeat cath.   . Arthritis     "hands, hips, all over" (08/28/2012  . Osteoarthritis of both feet   . Breast cancer     left   Past Surgical History  Procedure Date  . Carpal tunnel release ~ 1970's    bilaterally  . Mastectomy 11/1982  Left Breast  . Vitrectomy [id number]    "both eyes; I've had total of 4"  . Tubal ligation 1984  . Tonsillectomy 1952    "?adenoids"   . Appendectomy 1991  . Femoral bypass     left leg "related to transmetatarsal amputation" (08/28/2012)  . Breast biopsy with sentinel lymph node biopsy and needle localization 1984  . Coronary artery bypass graft 1992    LIMA to LAD, SVG to distal LCX & SVG to Marginal  . Cardiac catheterization 2010    LIMA to LAD patent yet with occluded distal LAD after graft insertion & retrograde is occluded. 3-4 septal perforators arise &are patent. SVG to a large marginal patent; SVG presumably to the distal dominant LCX is occluded, moderately high grade stenosis of the AV circumflex, but with distal stenoses involving a severely diseased marginal branch not amenable  to PCI  nor is inferior branch   . Cardiac  catheterization 10/2011    "unsuccessful attempt of stenting" (08/28/2012)  . Cardiac catheterization 1992; 09/2011  . Breast biopsy   . Trigger finger release 1980's    right thumb  . Incision and drainage of wound ~ 1967    "infection in left foot" (08/28/2012)  . Incision and drainage of wound 2009    "3 ORs on my right hand after cat bite" (08/28/2012)  . 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 Cataract extraction w/ intraocular lens  implant, bilateral 1990's   Family History  Problem Relation Age of Onset  . Diabetes Mother    History   Social History  . Marital Status: Single    Spouse Name: N/A    Number of Children: N/A  . Years of Education: N/A   Occupational History  . Environmental health practitioner    Social History Main Topics  . Smoking status: Never Smoker   . Smokeless tobacco: Never Used  . Alcohol Use: No  . Drug Use: No  . Sexually Active: Not Currently    Birth Control/ Protection: Post-menopausal   Other Topics Concern  . Not on file   Social History Narrative  . No narrative on file    Review of Systems  Constitutional: Positive for activity change (2/2 SOB) and appetite change (slight decrease). Negative for fever, chills, fatigue and unexpected weight change.  HENT: Positive for postnasal drip. Negative for sore throat, facial swelling, rhinorrhea, sneezing, neck stiffness and sinus pressure.   Eyes: Negative for redness and itching.  Respiratory: Positive for cough (productive cough, clear phlegm) and shortness of breath. Negative for chest tightness.   Cardiovascular: Positive for leg swelling (chronic 2/2 CHF). Negative for chest pain.  Gastrointestinal: Positive for abdominal pain (cramping (started Tuesday 09/02/2012)). Negative for nausea, vomiting, diarrhea, constipation and blood in stool.  Genitourinary: Negative for frequency and difficulty urinating.  Musculoskeletal: Negative for back pain.  Skin: Positive  for color change (some areas of healing wounds 2/2 itching).  Neurological: Positive for dizziness. Negative for headaches.  Psychiatric/Behavioral: Negative.        Objective:   Physical Exam  Constitutional: She is oriented to person, place, and time. She appears well-developed and well-nourished. No distress.  HENT:  Head: Normocephalic and atraumatic.  Eyes: Conjunctivae normal and EOM are normal. Pupils are equal, round, and reactive to light.  Neck: No thyromegaly present.  Cardiovascular: Normal rate and regular rhythm.  Exam reveals no gallop and no friction rub.   Murmur (2/6 systolic) heard. Pulmonary/Chest: Effort  normal and breath sounds normal. No respiratory distress. She has no wheezes. She has no rales. She exhibits no tenderness.  Abdominal: Soft. Bowel sounds are normal. There is no tenderness. There is no rebound and no guarding.  Musculoskeletal: Normal range of motion. She exhibits edema (b/l LE) and tenderness (cellulitis of left LE).  Neurological: She is alert and oriented to person, place, and time.  Skin: Rash (cellulitis of LE) noted. She is not diaphoretic. There is erythema (b/l LE).   Filed Vitals:   09/05/12 1612  BP: 136/57  Pulse: 65  Temp: 96.8 F (36 C)       Assessment/Plan:     66 year old female presents to the clinic after an increase of Cr from baseline and a K+ of 6.0 found on 09/04/12 for a follow up on renal function.   1. Shortness of breath. Likely complicated by her CHF and chronic kidney disease. - CXR: Evidence of pulmonary edema present. - Will adjust Lasix regimen for 2 weeks: Lasix 40mg  PO daily for 5 days a week, and 80mg  for 2 days a week (Tuesday and Saturday). After 2 weeks, she will resume regimen provided by her cardiologist: 40mg  5 days a week, 60mg  on Tuesday and Saturday.   2. Acute on Chronic Kidney Disease - K+ was 6.0 on 09/04/12. EKG: No peaked T waves appreciated. Kayexalate given.Patient declined admission for  elevated potassium  3. Cellulitis Bactrim side effects for treatment of cellulitis. Complained of abdominal cramps and pruritis - Changed to Clindamycin as patient has a history of tolerating the medication.  Clindamycin 450mg  PO TID x 14 days. Patient had a bottle of Clindamycin filled on 08/25/12, and has about 7 days worth of pills. We will order Clindamycin for a new order to cover the remaining 7 days of the course.

## 2012-09-08 NOTE — Telephone Encounter (Signed)
Set up for December

## 2012-09-08 NOTE — Telephone Encounter (Signed)
Called patient back. No openings available in December so scheduled for 10/27/2012 to see Dr.Ross.

## 2012-09-08 NOTE — Telephone Encounter (Signed)
Called patient and she advised that she was in the hospital last week for cellulitis and was treated with IV Vancomycin and then had an allergic reaction to Bactrim. She states that she feels fine now and the leg infection is resolved.  Her last appointment with Dr.Ross was at the end of August and she would like to know when to be seen again. Advised will ask Dr.Ross and call her back next week.

## 2012-09-15 ENCOUNTER — Ambulatory Visit: Payer: Medicare Other | Admitting: Cardiovascular Disease

## 2012-09-18 ENCOUNTER — Encounter (HOSPITAL_COMMUNITY): Payer: Medicare Other

## 2012-09-20 ENCOUNTER — Other Ambulatory Visit: Payer: Self-pay | Admitting: Internal Medicine

## 2012-09-24 ENCOUNTER — Ambulatory Visit (HOSPITAL_BASED_OUTPATIENT_CLINIC_OR_DEPARTMENT_OTHER): Payer: Medicare Other

## 2012-09-24 VITALS — BP 105/48 | HR 61 | Temp 97.8°F

## 2012-09-24 DIAGNOSIS — E538 Deficiency of other specified B group vitamins: Secondary | ICD-10-CM

## 2012-09-24 DIAGNOSIS — D518 Other vitamin B12 deficiency anemias: Secondary | ICD-10-CM

## 2012-09-24 MED ORDER — CYANOCOBALAMIN 1000 MCG/ML IJ SOLN
1000.0000 ug | Freq: Once | INTRAMUSCULAR | Status: AC
  Administered 2012-09-24: 1000 ug via INTRAMUSCULAR

## 2012-10-22 NOTE — Addendum Note (Signed)
Addended by: Remus Blake on: 10/22/2012 02:40 PM   Modules accepted: Orders

## 2012-10-23 ENCOUNTER — Encounter: Payer: Self-pay | Admitting: Internal Medicine

## 2012-10-27 ENCOUNTER — Ambulatory Visit (INDEPENDENT_AMBULATORY_CARE_PROVIDER_SITE_OTHER): Payer: Medicare Other | Admitting: Internal Medicine

## 2012-10-27 ENCOUNTER — Encounter: Payer: Self-pay | Admitting: Internal Medicine

## 2012-10-27 VITALS — BP 164/58 | HR 71 | Ht 63.5 in | Wt 156.0 lb

## 2012-10-27 DIAGNOSIS — I25810 Atherosclerosis of coronary artery bypass graft(s) without angina pectoris: Secondary | ICD-10-CM

## 2012-10-27 DIAGNOSIS — E785 Hyperlipidemia, unspecified: Secondary | ICD-10-CM

## 2012-10-27 LAB — BASIC METABOLIC PANEL
BUN: 36 mg/dL — ABNORMAL HIGH (ref 6–23)
Calcium: 9.5 mg/dL (ref 8.4–10.5)
Chloride: 101 mEq/L (ref 96–112)
Creatinine, Ser: 1.2 mg/dL (ref 0.4–1.2)

## 2012-10-27 LAB — LIPID PANEL
Cholesterol: 142 mg/dL (ref 0–200)
LDL Cholesterol: 83 mg/dL (ref 0–99)
Total CHOL/HDL Ratio: 4
Triglycerides: 119 mg/dL (ref 0.0–149.0)

## 2012-10-27 NOTE — Patient Instructions (Signed)
Lab work today Will call you with results  Your physician wants you to follow-up in: June 2014 You will receive a reminder letter in the mail two months in advance. If you don't receive a letter, please call our office to schedule the follow-up appointment.

## 2012-10-27 NOTE — Progress Notes (Signed)
HPI Patient is a 67 yo with  a history of CAD (s/p CABG- cath in 09/2011 showed patent LIMA to LAD with occlusion of distal LAD; High grade stenosis of nondominant RCA; Signif disease of prox LCx; SVG to L PDA occluded, SVG to OM patent), Aortic stenosis, DM, dyslipidemia, POVD, s/p revascularization - followed by [name], CKD. The patient unde ssful and the procedure was aborted. The patient was evaluat  ed by [name]. He thought she would be a very poor candidate for surgical revascularization. Med Rx continued. Echo 10/2011: Mild LVH, EF 30%, apex akinetic, septal HK, grade 2 diastolic dysfunction, or functionally bicuspid aortic valve, mild aortic stenosis by gradient, severe by calculated AVA-suspect A. Aortic stenosis probably moderate, trivial MR, mild LAE, mild RAE.   The patient was last in cardiology clinic in August Since the she was admitted for cellulitis.  Had allergic rxn to Bactrim  Got very SOB with CHF symptoms  Also, she was treated for K of 6 with outpatient Kayexelate.  She is now breathing better  Weight is going down  Is 149# at home.  Denies CP   Hurt toe last week wearing shoes.  Small blister  Now covering with dry gauze Hit metal table on weekend with L shin  Skinned  OK now.   Allergies  Allergen Reactions  . Adhesive (Tape) Other (See Comments)    "old [employer] adhesive tape; takes my skin off; can use paper tape"  . Bactrim (Sulfamethoxazole W-Trimethoprim)     Hyperkalemia and Increased creatinine  . Cephalexin Swelling and Rash  . Ciprofloxacin Swelling and Rash    Current Outpatient Prescriptions  Medication Sig Dispense Refill  . acetaminophen (TYLENOL) 500 MG tablet Take 1,000 mg by mouth every 6 (six) hours as needed. For pain/fever      . aspirin EC 325 MG EC tablet Take 325 mg by mouth daily.        [name] atorvastatin (LIPITOR) 40 MG tablet Take 1 tablet (40 mg total) by mouth at bedtime.  30 tablet  11  . brimonidine (ALPHAGAN) 0.2 %  ophthalmic solution Place 1 drop into both eyes 2 (two) times daily.        . cyanocobalamin (,VITAMIN B-12,) 1000 MCG/ML injection Inject 1,000 mcg into the muscle every 30 (thirty) days.      . diphenhydrAMINE (BENADRYL) 25 mg capsule Take 25 mg by mouth every 6 (six) hours as needed. For cold symptoms      . docusate sodium (COLACE) 50 MG capsule Take by mouth 2 (two) times daily.      [name] ezetimibe (ZETIA) 10 MG tablet Take 1 tablet (10 mg total) by mouth daily.  30 tablet  6  . Ferrous Sulfate (SLOW RELEASE IRON) 50 MG TBCR Take 50 mg by mouth daily.       . furosemide (LASIX) 20 MG tablet Take 3 tablets (60 mg total) by mouth daily.  90 tablet  0  . ibuprofen (ADVIL,MOTRIN) 200 MG tablet Take 400 mg by mouth every 6 (six) hours as needed. For pain      . insulin glargine (LANTUS) 100 UNIT/ML injection Take 20 units subcutaneously each morning and 10 units each evening.      . insulin lispro (HUMALOG PEN) 100 UNIT/ML injection Inject 0-9 Units into the skin 4 (four) times daily. Per sliding scale DO NOT GIVE WITHOUT CONSULTING PATIENT ON HER SLIDING SCALE      . isosorbide mononitrate (IMDUR) 30 MG 24 hr  tablet TAKE 1 TABLET (30 MG TOTAL) BY MOUTH DAILY.  30 tablet  1  . latanoprost (XALATAN) 0.005 % ophthalmic solution Place 1 drop into both eyes at bedtime.        [name] levothyroxine (SYNTHROID, LEVOTHROID) 125 MCG tablet Take 125 mcg by mouth daily.       . metoprolol succinate (TOPROL-XL) 25 MG 24 hr tablet Take 1 tablet (25 mg total) by mouth daily.  30 tablet  11  . nitroGLYCERIN (NITROSTAT) 0.4 MG SL tablet Place 0.4 mg under the tongue every 5 (five) minutes as needed. For chest pain        Past Medical History  Diagnosis Date  . HTN (hypertension)   . Hypothyroidism   . History of recurrent UTIs   . CAD (coronary artery disease)     s/p CABG x3 in 1993; last cath in 2010  . Scleroderma   . Bilateral carpal tunnel syndrome 1970s  . PVD (peripheral vascular disease)     Toes  amputated from left foot and has had prior bypass on left leg. Followed by [name]  . Claudication   . Aortic stenosis     0.8cm2 on echo in 10/2011  . Hyperlipidemia     on Lipitor  . Cat bite 2009    with MRSA; required I & D  . Heart murmur   . Blood transfusion     no reaction from transfusion; "when I had heart surgery" (08/28/2012)  . GERD (gastroesophageal reflux disease)   . CHF (congestive heart failure) Aug. 2012    Echo 10/2011: Mild LVH, EF 30%, apex akinetic, septal HK, grade 2 diastolic dysfunction, or functionally bicuspid aortic valve, mild aortic stenosis by gradient, severe by calculated AVA-suspect A. Aortic stenosis probably moderate, trivial MR, mild LAE, mild RAE.   [name] Cellulitis   . Anemia   . B12 deficiency anemia     B 12 injection "monthly since 05/2012" (08/28/2012)  . Iron deficiency anemia 06/13/2012    "iron infusion" (08/28/2012  . Anginal pain     "history; not currently" (08/28/2012)  . Myocardial infarction 1986    "silent"  . History of bronchitis     "I've had it twice in my life" (08/28/2012)  . Exertional dyspnea     "because of the heart failure" (08/28/2012)  . Type 1 diabetes 10/1949  . Stomach ulcer 1981?    "on Tagament for ~ 1 yr" (08/28/2012)  . Seizure     ? seizure like event in 2010; workup included stress testing which led to repeat cath.   . Arthritis     "hands, hips, all over" (08/28/2012  . Osteoarthritis of both feet   . Breast cancer     left    Past Surgical History  Procedure Date  . Carpal tunnel release ~ 1970's    bilaterally  . Mastectomy 11/1982    Left Breast  . Vitrectomy 1990's-2004    "both eyes; I've had total of 4"  . Tubal ligation 1984  . Tonsillectomy 1952    "?adenoids"   . Appendectomy 1991  . Femoral bypass     left leg "related to transmetatarsal amputation" (08/28/2012)  . Breast biopsy with sentinel lymph node biopsy and needle localization 1984  . Coronary artery bypass graft 1992    LIMA  to LAD, SVG to distal LCX & SVG to Marginal  . Cardiac catheterization 2010    LIMA to LAD patent yet with occluded distal LAD after  graft insertion & retrograde is occluded. 3-4 septal perforators arise &are patent. SVG to a large marginal patent; SVG presumably to the distal dominant LCX is occluded, moderately high grade stenosis of the AV circumflex, but with distal stenoses involving a severely diseased marginal branch not amenable  to PCI  nor is inferior branch   . Cardiac catheterization 10/2011    "unsuccessful attempt of stenting" (08/28/2012)  . Cardiac catheterization 1992; 09/2011  . Breast biopsy   . Trigger finger release 1980's    right thumb  . Incision and drainage of wound ~ 1967    "infection in left foot" (08/28/2012)  . Incision and drainage of wound 2009    "3 ORs on my right hand after cat bite" (08/28/2012)  . 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 Cataract extraction w/ intraocular lens  implant, bilateral 1990's    Family History  Problem Relation Age of Onset  . Diabetes Mother     History   Social History  . Marital Status: Single    Spouse Name: N/A    Number of Children: N/A  . Years of Education: N/A   Occupational History  . Environmental health practitioner    Social History Main Topics  . Smoking status: Never Smoker   . Smokeless tobacco: Never Used  . Alcohol Use: No  . Drug Use: No  . Sexually Active: Not Currently    Birth Control/ Protection: Post-menopausal   Other Topics Concern  . Not on file   Social History Narrative  . No narrative on file    Review of Systems:  All systems reviewed.  They are negative to the above problem except as previously stated.  Vital Signs: BP 164/58  Pulse 71  Ht 5' 3.5" (1.613 m)  Wt 156 lb (70.761 kg)  BMI 27.20 kg/m2  Physical Exam Patient is in NAD HEENT:  Normocephalic, atraumatic. EOMI, PERRLA.  Neck: JVP is normal.  No bruits.  Lungs: clear to auscultation.  No rales no wheezes.  Heart: Regular rate and rhythm. Normal S1, S2. No S3.   No significant murmurs. PMI not displaced.  Abdomen:  Supple, nontender. Normal bowel sounds. No masses. No hepatomegaly.  Extremities:   Feet sl puffy  R 5th toe  Small excoriation on lateral surface. Musculoskeletal :moving all extremities.  Neuro:   alert and oriented x3.  CN II-XII grossly intact.   Assessment and Plan:  1.  CAD  Seems to be doing OK  Would continue on current regimen  2.  HTN  BP usually runs 110s to 120s  High today  Will follow  Patient needs to take home cuff to doctor's visit for calibration  3.  HL  Will check today even though has eaten small amt  4.  PVOD  Foot with small excoriation on lateral side of R small toe.  Wrap with bandage when wearing slipper  Otherwise keep open  WIll need to be followed closely.

## 2012-10-28 ENCOUNTER — Ambulatory Visit (HOSPITAL_BASED_OUTPATIENT_CLINIC_OR_DEPARTMENT_OTHER): Payer: Medicare Other

## 2012-10-28 VITALS — BP 124/54 | HR 69 | Temp 97.6°F

## 2012-10-28 DIAGNOSIS — D518 Other vitamin B12 deficiency anemias: Secondary | ICD-10-CM

## 2012-10-28 MED ORDER — CYANOCOBALAMIN 1000 MCG/ML IJ SOLN
1000.0000 ug | Freq: Once | INTRAMUSCULAR | Status: AC
  Administered 2012-10-28: 1000 ug via INTRAMUSCULAR

## 2012-10-28 NOTE — Addendum Note (Signed)
Addended by: Pricilla Riffle on: 10/28/2012 11:10 PM   Modules accepted: Level of Service

## 2012-11-13 DIAGNOSIS — Z23 Encounter for immunization: Secondary | ICD-10-CM | POA: Diagnosis not present

## 2012-11-15 ENCOUNTER — Ambulatory Visit (INDEPENDENT_AMBULATORY_CARE_PROVIDER_SITE_OTHER): Payer: Medicare Other | Admitting: Family Medicine

## 2012-11-15 ENCOUNTER — Ambulatory Visit: Payer: Medicare Other

## 2012-11-15 VITALS — BP 100/51 | HR 70 | Temp 98.0°F | Resp 17 | Ht 64.0 in | Wt 157.0 lb

## 2012-11-15 DIAGNOSIS — S2000XA Contusion of breast, unspecified breast, initial encounter: Secondary | ICD-10-CM | POA: Diagnosis not present

## 2012-11-15 DIAGNOSIS — S2341XA Sprain of ribs, initial encounter: Secondary | ICD-10-CM

## 2012-11-15 DIAGNOSIS — S299XXA Unspecified injury of thorax, initial encounter: Secondary | ICD-10-CM

## 2012-11-15 DIAGNOSIS — S20219A Contusion of unspecified front wall of thorax, initial encounter: Secondary | ICD-10-CM | POA: Diagnosis not present

## 2012-11-15 DIAGNOSIS — R0781 Pleurodynia: Secondary | ICD-10-CM

## 2012-11-15 DIAGNOSIS — R079 Chest pain, unspecified: Secondary | ICD-10-CM | POA: Diagnosis not present

## 2012-11-15 DIAGNOSIS — S2001XA Contusion of right breast, initial encounter: Secondary | ICD-10-CM

## 2012-11-15 NOTE — Patient Instructions (Addendum)
1. Rib injury  DG Ribs Unilateral W/Chest Right  2. Rib pain on right side    3. Contusion of rib    4. Contusion of breast, right     Rib Contusion A rib contusion (bruise) can occur by a blow to the chest or by a fall against a hard object. Usually these will be much better in a couple weeks. If X-rays were taken today and there are no broken bones (fractures), the diagnosis of bruising is made. However, broken ribs may not show up for several days, or may be discovered later on a routine X-ray when signs of healing show up. If this happens to you, it does not mean that something was missed on the X-ray, but simply that it did not show up on the first X-rays. Earlier diagnosis will not usually change the treatment. HOME CARE INSTRUCTIONS   Avoid strenuous activity. Be careful during activities and avoid bumping the injured ribs. Activities that pull on the injured ribs and cause pain should be avoided, if possible.  For the first day or two, an ice pack used every 20 minutes while awake may be helpful. Put ice in a plastic bag and put a towel between the bag and the skin.  Eat a normal, well-balanced diet. Drink plenty of fluids to avoid constipation.  Take deep breaths several times a day to keep lungs free of infection. Try to cough several times a day. Splint the injured area with a pillow while coughing to ease pain. Coughing can help prevent pneumonia.  Wear a rib belt or binder only if told to do so by your caregiver. If you are wearing a rib belt or binder, you must do the breathing exercises as directed by your caregiver. If not used properly, rib belts or binders restrict breathing which can lead to pneumonia.  Only take over-the-counter or prescription medicines for pain, discomfort, or fever as directed by your caregiver. SEEK MEDICAL CARE IF:   You or your child has an oral temperature above 102 F (38.9 C).  Your baby is older than 3 months with a rectal temperature of 100.5  F (38.1 C) or higher for more than 1 day.  You develop a cough, with thick or bloody sputum. SEEK IMMEDIATE MEDICAL CARE IF:   You have difficulty breathing.  You feel sick to your stomach (nausea), have vomiting or belly (abdominal) pain.  You have worsening pain, not controlled with medications, or there is a change in the location of the pain.  You develop sweating or radiation of the pain into the arms, jaw or shoulders, or become light headed or faint.  You or your child has an oral temperature above 102 F (38.9 C), not controlled by medicine.  Your or your baby is older than 3 months with a rectal temperature of 102 F (38.9 C) or higher.  Your baby is 19 months old or younger with a rectal temperature of 100.4 F (38 C) or higher. MAKE SURE YOU:   Understand these instructions.  Will watch your condition.  Will get help right away if you are not doing well or get worse. Document Released: 06/26/2001 Document Revised: 12/24/2011 Document Reviewed: 05/19/2008 [facility] Patient Information 2013 [place], [place].

## 2012-11-15 NOTE — Progress Notes (Signed)
[address]   [place], [place]  [address]   [id number]  Subjective:    Patient ID: [name], female    DOB: Feb 22, 1946, 67 y.o.   MRN: [id number]  HPIThis 67 y.o. female presents for evaluation of R rib pain.  [place]arey Seat three days ago on the ice. Slipped on ice and landed on R breast.  Really sore on lateral breast edge.  No fever but +chills; no sweats.  S/p flu vaccine two days ago.  +myalgias since flu vaccine.  No difficulties with deep breathing.  Pain with deep breathing.  +SOB worse from baseline.  No bruising along area of fall.  No cough.  History of rib fractures; pain not as severe as with rib fractures.  History of CHF so did not want to take any chances with suffering with recurrent rib fractures.  No recent worsening swelling, orthopnea, chest pain, DOE.   Review of Systems  Constitutional: Positive for chills. Negative for fever, diaphoresis and fatigue.  Respiratory: Negative for apnea, cough, chest tightness, shortness of breath, wheezing and stridor.   Cardiovascular: Positive for chest pain. Negative for palpitations and leg swelling.  Musculoskeletal: Positive for myalgias and arthralgias. Negative for back pain and joint swelling.  Neurological: Negative for weakness.  Hematological: Negative for adenopathy. Does not bruise/bleed easily.        Past Medical History  Diagnosis Date  . HTN (hypertension)   . Hypothyroidism   . History of recurrent UTIs   . CAD (coronary artery disease)     s/p CABG x3 in 1993; last cath in 2010  . Scleroderma   . Bilateral carpal tunnel syndrome 1970s  . 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 Followed by [name]  . Claudication   . Aortic stenosis     0.8cm2 on echo in 10/2011  . Hyperlipidemia     on Lipitor  . Cat bite 2009    with MRSA; required I & D  . Heart murmur   . Blood transfusion     no reaction from transfusion; "when I had heart  surgery" (08/28/2012)  . GERD (gastroesophageal reflux disease)   . CHF (congestive heart failure) Aug. 2012    Echo 10/2011: Mild LVH, EF 30%, apex akinetic, septal HK, grade 2 diastolic dysfunction, or functionally bicuspid aortic valve, mild aortic stenosis by gradient, severe by calculated AVA-suspect A. Aortic stenosis probably moderate, trivial MR, mild LAE, mild RAE.   [name] Cellulitis   . Anemia   . B12 deficiency anemia     B 12 injection "monthly since 05/2012" (08/28/2012)  . Iron deficiency anemia 06/13/2012    "iron infusion" (08/28/2012  . Anginal pain     "history; not currently" (08/28/2012)  . Myocardial infarction 1986    "silent"  . History of bronchitis     "I've had it twice in my life" (08/28/2012)  . Exertional dyspnea     "because of the heart failure" (08/28/2012)  . Type 1 diabetes 10/1949  . Stomach ulcer 1981?    "on Tagament for ~ 1 yr" (08/28/2012)  . Seizure     ? seizure like event in 2010; workup included stress testing which led to repeat cath.   . Arthritis     "hands, hips, all over" (08/28/2012  . Osteoarthritis of both feet   . Breast cancer     left    Past Surgical  History  Procedure Date  . Carpal tunnel release ~ 1970's    bilaterally  . Mastectomy 11/1982    Left Breast  . Vitrectomy 1990's-2004    "both eyes; I've had total of 4"  . Tubal ligation 1984  . Tonsillectomy 1952    "?adenoids"   . Appendectomy 1991  . Femoral bypass     left leg "related to transmetatarsal amputation" (08/28/2012)  . Breast biopsy with sentinel lymph node biopsy and needle localization 1984  . Coronary artery bypass graft 1992    LIMA to LAD, SVG to distal LCX & SVG to Marginal  . Cardiac catheterization 2010    LIMA to LAD patent yet with occluded distal LAD after graft insertion & retrograde is occluded. 3-4 septal perforators arise &are patent. SVG to a large marginal patent; SVG presumably to the distal dominant LCX is occluded, moderately high grade  stenosis of the AV circumflex, but with distal stenoses involving a severely diseased marginal branch not amenable  to PCI  nor is inferior branch   . Cardiac catheterization 10/2011    "unsuccessful attempt of stenting" (08/28/2012)  . Cardiac catheterization 1992; 09/2011  . Breast biopsy   . Trigger finger release 1980's    right thumb  . Incision and drainage of wound ~ 1967    "infection in left foot" (08/28/2012)  . Incision and drainage of wound 2009    "3 ORs on my right hand after cat bite" (08/28/2012)  . 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 Cataract extraction w/ intraocular lens  implant, bilateral 1990's    Prior to Admission medications   Medication Sig Start Date End Date Taking? Authorizing Provider  acetaminophen (TYLENOL) 500 MG tablet Take 1,000 mg by mouth every 6 (six) hours as needed. For pain/fever   Yes Historical Provider, MD  aspirin EC 325 MG EC tablet Take 325 mg by mouth daily.     Yes Historical Provider, MD  atorvastatin (LIPITOR) 40 MG tablet Take 1 tablet (40 mg total) by mouth at bedtime. 03/28/12  Yes Pricilla Riffle, MD  brimonidine (ALPHAGAN) 0.2 % ophthalmic solution Place 1 drop into both eyes 2 (two) times daily.     Yes Historical Provider, MD  cyanocobalamin (,VITAMIN B-12,) 1000 MCG/ML injection Inject 1,000 mcg into the muscle every 30 (thirty) days.   Yes Historical Provider, MD  docusate sodium (COLACE) 50 MG capsule Take by mouth 2 (two) times daily.   Yes Historical Provider, MD  ezetimibe (ZETIA) 10 MG tablet Take 1 tablet (10 mg total) by mouth daily. 07/25/12  Yes Pricilla Riffle, MD  Ferrous Sulfate (SLOW RELEASE IRON) 50 MG TBCR Take 50 mg by mouth daily.    Yes Historical Provider, MD  furosemide (LASIX) 20 MG tablet Take 3 tablets (60 mg total) by mouth daily. 09/02/12  Yes [name], MD  ibuprofen (ADVIL,MOTRIN) 200 MG tablet Take 400 mg by mouth every 6 (six) hours as needed. For pain   Yes Historical Provider,  MD  insulin glargine (LANTUS) 100 UNIT/ML injection Take 20 units subcutaneously each morning and 10 units each evening.   Yes Historical Provider, MD  insulin lispro (HUMALOG PEN) 100 UNIT/ML injection Inject 0-9 Units into the skin 4 (four) times daily. Per sliding scale DO NOT GIVE WITHOUT CONSULTING PATIENT ON HER SLIDING SCALE   Yes Historical Provider, MD  isosorbide mononitrate (IMDUR) 30 MG 24 hr tablet TAKE 1 TABLET (30 MG TOTAL)  BY MOUTH DAILY. 09/20/12  Yes Pricilla Riffle, MD  latanoprost (XALATAN) 0.005 % ophthalmic solution Place 1 drop into both eyes at bedtime.     Yes Historical Provider, MD  levothyroxine (SYNTHROID, LEVOTHROID) 125 MCG tablet Take 125 mcg by mouth daily.    Yes Historical Provider, MD  metoprolol succinate (TOPROL-XL) 25 MG 24 hr tablet Take 1 tablet (25 mg total) by mouth daily. 06/13/12  Yes Pricilla Riffle, MD  diphenhydrAMINE (BENADRYL) 25 mg capsule Take 25 mg by mouth every 6 (six) hours as needed. For cold symptoms    Historical Provider, MD  nitroGLYCERIN (NITROSTAT) 0.4 MG SL tablet Place 0.4 mg under the tongue every 5 (five) minutes as needed. For chest pain 09/15/11 09/14/12  Roger A Arguello, PA-C    Allergies  Allergen Reactions  . Adhesive (Tape) Other (See Comments)    "old Johnson/Johnson adhesive tape; takes my skin off; can use paper tape"  . Bactrim (Sulfamethoxazole W-Trimethoprim)     Hyperkalemia and Increased creatinine  . Cephalexin Swelling and Rash  . Ciprofloxacin Swelling and Rash    History   Social History  . Marital Status: Single    Spouse Name: N/A    Number of Children: N/A  . Years of Education: N/A   Occupational History  . Environmental health practitioner    Social History Main Topics  . Smoking status: Never Smoker   . Smokeless tobacco: Never Used  . Alcohol Use: No  . Drug Use: No  . Sexually Active: Not Currently    Birth Control/ Protection: Post-menopausal   Other Topics Concern  . Not on file   Social History  Narrative  . No narrative on file    Family History  Problem Relation Age of Onset  . Diabetes Mother     Objective:   Physical Exam  Nursing note and vitals reviewed. Constitutional: She is oriented to person, place, and time. She appears well-developed and well-nourished. No distress.  Cardiovascular: Normal rate, regular rhythm and normal heart sounds.   Pulmonary/Chest: Effort normal and breath sounds normal. No respiratory distress. She has no wheezes. She has no rales.  Abdominal: Soft. Bowel sounds are normal. She exhibits no distension. There is no tenderness. There is no rebound.  Genitourinary: There is breast tenderness. No breast swelling, discharge or bleeding.       R breast: no induration, erythema, ecchymoses.  +thickening surrounding areola.  Mildly TTP R lateral breast.  Musculoskeletal:       Right shoulder: Normal.       Left shoulder: Normal.       Cervical back: Normal.       Thoracic back: Normal.       Lumbar back: Normal.       R RIBS:  +TTP R lateral breast border along ribs; no lateral rib tenderness; no sternal TTP.  Neurological: She is alert and oriented to person, place, and time. No cranial nerve deficit. She exhibits normal muscle tone. Coordination normal.  Skin: Skin is warm and dry. No rash noted. She is not diaphoretic. No erythema. No pallor.  Psychiatric: She has a normal mood and affect. Her behavior is normal.      [facility] reading (PRIMARY) by  Dr. Katrinka Blazing.  RIB FILMS: NAD.   Assessment & Plan:   1. Rib injury  DG Ribs Unilateral W/Chest Right  2. Rib pain on right side    3. Contusion of rib    4. Contusion of breast, right  1 Rib Pain R:  New.   Following trauma.  Supportive care with rest, Tylenol. 2.  Rib contusion R: New.  Supportive care with rest, Tylenol. Avoid heavy lifting for next several weeks. 3.  R Breast Contusion:  New.  Recommend supportive care with rest, ice or heat.

## 2012-11-25 ENCOUNTER — Ambulatory Visit: Payer: Medicare Other

## 2012-11-26 ENCOUNTER — Telehealth: Payer: Self-pay | Admitting: Oncology

## 2012-11-26 ENCOUNTER — Other Ambulatory Visit (HOSPITAL_BASED_OUTPATIENT_CLINIC_OR_DEPARTMENT_OTHER): Payer: Medicare Other | Admitting: Lab

## 2012-11-26 ENCOUNTER — Ambulatory Visit (HOSPITAL_BASED_OUTPATIENT_CLINIC_OR_DEPARTMENT_OTHER): Payer: Medicare Other

## 2012-11-26 ENCOUNTER — Ambulatory Visit (HOSPITAL_BASED_OUTPATIENT_CLINIC_OR_DEPARTMENT_OTHER): Payer: Medicare Other | Admitting: Oncology

## 2012-11-26 ENCOUNTER — Encounter: Payer: Self-pay | Admitting: Oncology

## 2012-11-26 VITALS — BP 105/64 | HR 79 | Temp 98.0°F | Resp 20 | Ht 64.0 in | Wt 156.7 lb

## 2012-11-26 DIAGNOSIS — D518 Other vitamin B12 deficiency anemias: Secondary | ICD-10-CM | POA: Diagnosis not present

## 2012-11-26 DIAGNOSIS — E538 Deficiency of other specified B group vitamins: Secondary | ICD-10-CM

## 2012-11-26 DIAGNOSIS — D509 Iron deficiency anemia, unspecified: Secondary | ICD-10-CM

## 2012-11-26 DIAGNOSIS — D649 Anemia, unspecified: Secondary | ICD-10-CM | POA: Diagnosis not present

## 2012-11-26 LAB — VITAMIN B12: Vitamin B-12: 612 pg/mL (ref 211–911)

## 2012-11-26 LAB — IRON AND TIBC
Iron: 44 ug/dL (ref 42–145)
UIBC: 193 ug/dL (ref 125–400)

## 2012-11-26 LAB — FERRITIN: Ferritin: 240 ng/mL (ref 10–291)

## 2012-11-26 MED ORDER — CYANOCOBALAMIN 1000 MCG/ML IJ SOLN
1000.0000 ug | Freq: Once | INTRAMUSCULAR | Status: AC
  Administered 2012-11-26: 1000 ug via INTRAMUSCULAR

## 2012-11-26 NOTE — Progress Notes (Signed)
OFFICE PROGRESS NOTE  CC  [name], MD [address] [place] [address]  DIAGNOSIS: 67 year old female with iron deficiency and B12 deficiency anemia  PRIOR THERAPY:  #1 patient is status post parenteral iron and currently receiving B12 injections every month.  CURRENT THERAPY: B12 injections q. monthly  INTERVAL HISTORY: [name] 67 y.o. female returns forFollowup visit. She tells me that it is amazing how well she feels. This is certainly indicated by her blood count today which is now normal.  And it is the best it has been in quite some long time. She denies any fevers chills night sweats headaches shortness of breath chest pains palpitations. She does have can to need lower extremity swelling. She denies any bleeding problems no chest pains no recent hospitalizations. Remainder of the review of systems is negative.  MEDICAL HISTORY: Past Medical History  Diagnosis Date  . HTN (hypertension)   . Hypothyroidism   . History of recurrent UTIs   . CAD (coronary artery disease)     s/p CABG x3 in 1993; last cath in 2010  . Scleroderma   . Bilateral carpal tunnel syndrome 1970s  . 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 Followed by [name]  . Claudication   . Aortic stenosis     0.8cm2 on echo in 10/2011  . Hyperlipidemia     on Lipitor  . Cat bite 2009    with MRSA; required I & D  . Heart murmur   . Blood transfusion     no reaction from transfusion; "when I had heart surgery" (08/28/2012)  . GERD (gastroesophageal reflux disease)   . CHF (congestive heart failure) Aug. 2012    Echo 10/2011: Mild LVH, EF 30%, apex akinetic, septal HK, grade 2 diastolic dysfunction, or functionally bicuspid aortic valve, mild aortic stenosis by gradient, severe by calculated AVA-suspect A. Aortic stenosis probably moderate, trivial MR, mild LAE, mild RAE.   [name] Cellulitis   . Anemia   . B12  deficiency anemia     B 12 injection "monthly since 05/2012" (08/28/2012)  . Iron deficiency anemia 06/13/2012    "iron infusion" (08/28/2012  . Anginal pain     "history; not currently" (08/28/2012)  . Myocardial infarction 1986    "silent"  . History of bronchitis     "I've had it twice in my life" (08/28/2012)  . Exertional dyspnea     "because of the heart failure" (08/28/2012)  . Type 1 diabetes 10/1949  . Stomach ulcer 1981?    "on Tagament for ~ 1 yr" (08/28/2012)  . Seizure     ? seizure like event in 2010; workup included stress testing which led to repeat cath.   . Arthritis     "hands, hips, all over" (08/28/2012  . Osteoarthritis of both feet   . Breast cancer     left    ALLERGIES:  is allergic to adhesive; bactrim; cephalexin; and ciprofloxacin.  MEDICATIONS:  Current Outpatient Prescriptions  Medication Sig Dispense Refill  . aspirin EC 325 MG EC tablet Take 325 mg by mouth daily.        [name] atorvastatin (LIPITOR) 40 MG tablet Take 1 tablet (40 mg total) by mouth at bedtime.  30 tablet  11  . brimonidine (ALPHAGAN) 0.2 % ophthalmic solution Place 1 drop into both eyes 2 (two) times daily.        [name]  cyanocobalamin (,VITAMIN B-12,) 1000 MCG/ML injection Inject 1,000 mcg into the muscle every 30 (thirty) days.      . diphenhydrAMINE (BENADRYL) 25 mg capsule Take 25 mg by mouth every 6 (six) hours as needed. For cold symptoms      . docusate sodium (COLACE) 50 MG capsule Take by mouth 2 (two) times daily.      [name] ezetimibe (ZETIA) 10 MG tablet Take 1 tablet (10 mg total) by mouth daily.  30 tablet  6  . Ferrous Sulfate (SLOW RELEASE IRON) 50 MG TBCR Take 50 mg by mouth daily.       . furosemide (LASIX) 20 MG tablet Take 3 tablets (60 mg total) by mouth daily.  90 tablet  0  . ibuprofen (ADVIL,MOTRIN) 200 MG tablet Take 400 mg by mouth every 6 (six) hours as needed. For pain      . insulin glargine (LANTUS) 100 UNIT/ML injection Take 20 units subcutaneously each morning and  10 units each evening.      . insulin lispro (HUMALOG PEN) 100 UNIT/ML injection Inject 0-9 Units into the skin 4 (four) times daily. Per sliding scale DO NOT GIVE WITHOUT CONSULTING PATIENT ON HER SLIDING SCALE      . isosorbide mononitrate (IMDUR) 30 MG 24 hr tablet TAKE 1 TABLET (30 MG TOTAL) BY MOUTH DAILY.  30 tablet  1  . latanoprost (XALATAN) 0.005 % ophthalmic solution Place 1 drop into both eyes at bedtime.        [name] levothyroxine (SYNTHROID, LEVOTHROID) 125 MCG tablet Take 125 mcg by mouth daily.       [name] acetaminophen (TYLENOL) 500 MG tablet Take 1,000 mg by mouth every 6 (six) hours as needed. For pain/fever      . metoprolol succinate (TOPROL-XL) 25 MG 24 hr tablet Take 1 tablet (25 mg total) by mouth daily.  30 tablet  11  . nitroGLYCERIN (NITROSTAT) 0.4 MG SL tablet Place 0.4 mg under the tongue every 5 (five) minutes as needed. For chest pain       No current facility-administered medications for this visit.    SURGICAL HISTORY:  Past Surgical History  Procedure Laterality Date  . Carpal tunnel release  ~ 1970's    bilaterally  . Mastectomy  11/1982    Left Breast  . Vitrectomy  1990's-2004    "both eyes; I've had total of 4"  . Tubal ligation  1984  . Tonsillectomy  1952    "?adenoids"   . Appendectomy  1991  . Femoral bypass      left leg "related to transmetatarsal amputation" (08/28/2012)  . Breast biopsy with sentinel lymph node biopsy and needle localization  1984  . Coronary artery bypass graft  1992    LIMA to LAD, SVG to distal LCX & SVG to Marginal  . Cardiac catheterization  2010    LIMA to LAD patent yet with occluded distal LAD after graft insertion & retrograde is occluded. 3-4 septal perforators arise &are patent. SVG to a large marginal patent; SVG presumably to the distal dominant LCX is occluded, moderately high grade stenosis of the AV circumflex, but with distal stenoses involving a severely diseased marginal branch not amenable  to PCI  nor is inferior  branch   . Cardiac catheterization  10/2011    "unsuccessful attempt of stenting" (08/28/2012)  . Cardiac catheterization  1992; 09/2011  . Breast biopsy    . Trigger finger release  1980's    right thumb  .  Incision and drainage of wound  ~ 1967    "infection in left foot" (08/28/2012)  . Incision and drainage of wound  2009    "3 ORs on my right hand after cat bite" (08/28/2012)  . 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 Cataract extraction w/ intraocular lens  implant, bilateral  1990's    REVIEW OF SYSTEMS:  Pertinent items are noted in HPI.   HEALTH MAINTENANCE:   PHYSICAL EXAMINATION: Blood pressure 105/64, pulse 79, temperature 98 F (36.7 C), temperature source Oral, resp. rate 20, height 5\' 4"  (1.626 m), weight 156 lb 11.2 oz (71.079 kg). Body mass index is 26.88 kg/(m^2). ECOG PERFORMANCE STATUS: 1 - Symptomatic but completely ambulatory   General appearance: alert, cooperative and appears stated age Lymph nodes: Cervical, supraclavicular, and axillary nodes normal. Resp: clear to auscultation bilaterally Back: symmetric, no curvature. ROM normal. No CVA tenderness. Cardio: regular rate and rhythm and systolic murmur: systolic ejection 2/6, buzzing at apex GI: soft, non-tender; bowel sounds normal; no masses,  no organomegaly Extremities: extremities normal, atraumatic, no cyanosis or edema Neurologic: Grossly normal   LABORATORY DATA: Lab Results  Component Value Date   WBC 6.6 08/30/2012   HGB 10.9* 08/30/2012   HCT 32.2* 08/30/2012   MCV 89.0 08/30/2012   PLT 208 08/30/2012      Chemistry      Component Value Date/Time   NA 136 10/27/2012 1035   NA 137 08/07/2012 1101   NA 133* 11/29/2011 1629   K 4.8 10/27/2012 1035   K 4.1 08/07/2012 1101   CL 101 10/27/2012 1035   CL 102 08/07/2012 1101   CO2 26 10/27/2012 1035   CO2 25 08/07/2012 1101   BUN 36* 10/27/2012 1035   BUN 25.0 08/07/2012 1101   BUN 39* 11/29/2011 1629    CREATININE 1.2 10/27/2012 1035   CREATININE 1.87* 09/05/2012 1605   CREATININE 1.0 08/07/2012 1101      Component Value Date/Time   CALCIUM 9.5 10/27/2012 1035   CALCIUM 9.8 08/07/2012 1101   ALKPHOS 115 06/03/2012 1529   AST 27 10/27/2012 1035   ALT 16 06/03/2012 1529   BILITOT 0.6 06/03/2012 1529       RADIOGRAPHIC STUDIES:  No results found.  ASSESSMENT: 67 year old female with B12 and iron deficiency anemia now significantly improved after having parenteral iron and B12 injections. Clinically she looks terrific. She is very pleased with her response to the therapy. We did check B12 level today.   PLAN: Patient will continue to have B12 injections every month. We will call her with the results of the iron studies. She will be seen back in 6 months time or sooner if need arises.   All questions were answered. The patient knows to call the clinic with any problems, questions or concerns. We can certainly see the patient much sooner if necessary.  I spent 20 minutes counseling the patient face to face. The total time spent in the appointment was 30 minutes.    Drue Second, MD Medical/Oncology [facility] [phone] (beeper) [phone] (Office)  11/26/2012, 12:21 PM

## 2012-11-26 NOTE — Patient Instructions (Signed)
Cal MD for problems or concerns

## 2012-11-26 NOTE — Patient Instructions (Addendum)
Continue oral iron and B12 injections   We will see you back in 6 months

## 2012-11-28 ENCOUNTER — Inpatient Hospital Stay (HOSPITAL_COMMUNITY): Payer: Medicare Other

## 2012-11-28 ENCOUNTER — Inpatient Hospital Stay (HOSPITAL_COMMUNITY)
Admission: EM | Admit: 2012-11-28 | Discharge: 2012-11-30 | DRG: 603 | Disposition: A | Discharge status: Home / Self Care | Payer: Medicare Other | Attending: Internal Medicine | Admitting: Internal Medicine

## 2012-11-28 ENCOUNTER — Encounter (HOSPITAL_COMMUNITY): Payer: Self-pay

## 2012-11-28 ENCOUNTER — Other Ambulatory Visit: Payer: Self-pay

## 2012-11-28 DIAGNOSIS — M7989 Other specified soft tissue disorders: Secondary | ICD-10-CM | POA: Diagnosis not present

## 2012-11-28 DIAGNOSIS — I509 Heart failure, unspecified: Secondary | ICD-10-CM | POA: Diagnosis present

## 2012-11-28 DIAGNOSIS — I129 Hypertensive chronic kidney disease with stage 1 through stage 4 chronic kidney disease, or unspecified chronic kidney disease: Secondary | ICD-10-CM | POA: Diagnosis present

## 2012-11-28 DIAGNOSIS — E785 Hyperlipidemia, unspecified: Secondary | ICD-10-CM | POA: Diagnosis present

## 2012-11-28 DIAGNOSIS — I2510 Atherosclerotic heart disease of native coronary artery without angina pectoris: Secondary | ICD-10-CM | POA: Diagnosis present

## 2012-11-28 DIAGNOSIS — E109 Type 1 diabetes mellitus without complications: Secondary | ICD-10-CM | POA: Diagnosis present

## 2012-11-28 DIAGNOSIS — N183 Chronic kidney disease, stage 3 unspecified: Secondary | ICD-10-CM | POA: Diagnosis present

## 2012-11-28 DIAGNOSIS — L02419 Cutaneous abscess of limb, unspecified: Secondary | ICD-10-CM | POA: Diagnosis not present

## 2012-11-28 DIAGNOSIS — Z951 Presence of aortocoronary bypass graft: Secondary | ICD-10-CM | POA: Diagnosis not present

## 2012-11-28 DIAGNOSIS — K219 Gastro-esophageal reflux disease without esophagitis: Secondary | ICD-10-CM | POA: Diagnosis present

## 2012-11-28 DIAGNOSIS — I252 Old myocardial infarction: Secondary | ICD-10-CM

## 2012-11-28 DIAGNOSIS — L03116 Cellulitis of left lower limb: Secondary | ICD-10-CM

## 2012-11-28 DIAGNOSIS — I739 Peripheral vascular disease, unspecified: Secondary | ICD-10-CM | POA: Diagnosis present

## 2012-11-28 DIAGNOSIS — S98139A Complete traumatic amputation of one unspecified lesser toe, initial encounter: Secondary | ICD-10-CM | POA: Diagnosis not present

## 2012-11-28 DIAGNOSIS — E039 Hypothyroidism, unspecified: Secondary | ICD-10-CM | POA: Diagnosis present

## 2012-11-28 DIAGNOSIS — M79609 Pain in unspecified limb: Secondary | ICD-10-CM | POA: Diagnosis not present

## 2012-11-28 DIAGNOSIS — E119 Type 2 diabetes mellitus without complications: Secondary | ICD-10-CM

## 2012-11-28 DIAGNOSIS — L03119 Cellulitis of unspecified part of limb: Secondary | ICD-10-CM | POA: Diagnosis present

## 2012-11-28 DIAGNOSIS — I10 Essential (primary) hypertension: Secondary | ICD-10-CM | POA: Diagnosis not present

## 2012-11-28 LAB — CBC WITH DIFFERENTIAL/PLATELET
Basophils Absolute: 0 10*3/uL (ref 0.0–0.1)
Basophils Relative: 1 % (ref 0–1)
Basophils Relative: 1 % (ref 0–1)
Eosinophils Absolute: 0.7 10*3/uL (ref 0.0–0.7)
Eosinophils Relative: 11 % — ABNORMAL HIGH (ref 0–5)
Eosinophils Relative: 12 % — ABNORMAL HIGH (ref 0–5)
HCT: 36.9 % (ref 36.0–46.0)
Hemoglobin: 12.8 g/dL (ref 12.0–15.0)
Lymphocytes Relative: 17 % (ref 12–46)
MCHC: 34.4 g/dL (ref 30.0–36.0)
MCHC: 34.7 g/dL (ref 30.0–36.0)
MCV: 93.4 fL (ref 78.0–100.0)
MCV: 93.7 fL (ref 78.0–100.0)
Monocytes Absolute: 0.6 10*3/uL (ref 0.1–1.0)
Monocytes Relative: 9 % (ref 3–12)
Neutro Abs: 4.3 10*3/uL (ref 1.7–7.7)
Platelets: 200 10*3/uL (ref 150–400)
RDW: 13.6 % (ref 11.5–15.5)
WBC: 6.5 10*3/uL (ref 4.0–10.5)

## 2012-11-28 LAB — COMPREHENSIVE METABOLIC PANEL
Albumin: 3.5 g/dL (ref 3.5–5.2)
BUN: 37 mg/dL — ABNORMAL HIGH (ref 6–23)
CO2: 26 mEq/L (ref 19–32)
Calcium: 9.9 mg/dL (ref 8.4–10.5)
Chloride: 100 mEq/L (ref 96–112)
Creatinine, Ser: 1.19 mg/dL — ABNORMAL HIGH (ref 0.50–1.10)
GFR calc non Af Amer: 46 mL/min — ABNORMAL LOW (ref >90)
Total Bilirubin: 0.7 mg/dL (ref 0.3–1.2)

## 2012-11-28 LAB — BASIC METABOLIC PANEL
CO2: 28 mEq/L (ref 19–32)
Calcium: 9.9 mg/dL (ref 8.4–10.5)
GFR calc Af Amer: 50 mL/min — ABNORMAL LOW (ref >90)
GFR calc non Af Amer: 43 mL/min — ABNORMAL LOW (ref >90)
Sodium: 134 mEq/L — ABNORMAL LOW (ref 135–145)

## 2012-11-28 LAB — CG4 I-STAT (LACTIC ACID): Lactic Acid, Venous: 2.27 mmol/L — ABNORMAL HIGH (ref 0.5–2.2)

## 2012-11-28 LAB — GLUCOSE, CAPILLARY: Glucose-Capillary: 65 mg/dL — ABNORMAL LOW (ref 70–99)

## 2012-11-28 LAB — URINALYSIS, ROUTINE W REFLEX MICROSCOPIC
Leukocytes, UA: NEGATIVE
Protein, ur: NEGATIVE mg/dL
Urobilinogen, UA: 0.2 mg/dL (ref 0.0–1.0)

## 2012-11-28 MED ORDER — INSULIN GLARGINE 100 UNIT/ML ~~LOC~~ SOLN
20.0000 [IU] | Freq: Every day | SUBCUTANEOUS | Status: DC
  Administered 2012-11-29 – 2012-11-30 (×2): 20 [IU] via SUBCUTANEOUS

## 2012-11-28 MED ORDER — VANCOMYCIN HCL IN DEXTROSE 1-5 GM/200ML-% IV SOLN
1000.0000 mg | Freq: Once | INTRAVENOUS | Status: AC
  Administered 2012-11-28: 1000 mg via INTRAVENOUS
  Filled 2012-11-28: qty 200

## 2012-11-28 MED ORDER — LATANOPROST 0.005 % OP SOLN
1.0000 [drp] | Freq: Every day | OPHTHALMIC | Status: DC
  Administered 2012-11-28: 1 [drp] via OPHTHALMIC
  Filled 2012-11-28: qty 2.5

## 2012-11-28 MED ORDER — ISOSORBIDE MONONITRATE ER 30 MG PO TB24
30.0000 mg | ORAL_TABLET | Freq: Every day | ORAL | Status: DC
  Administered 2012-11-29 – 2012-11-30 (×2): 30 mg via ORAL
  Filled 2012-11-28 (×2): qty 1

## 2012-11-28 MED ORDER — CHLORHEXIDINE GLUCONATE CLOTH 2 % EX PADS: 6.0000 | MEDICATED_PAD | Freq: Every day | CUTANEOUS | Status: DC

## 2012-11-28 MED ORDER — METOPROLOL SUCCINATE ER 25 MG PO TB24
25.0000 mg | ORAL_TABLET | Freq: Every day | ORAL | Status: DC
  Administered 2012-11-29 – 2012-11-30 (×2): 25 mg via ORAL
  Filled 2012-11-28 (×2): qty 1

## 2012-11-28 MED ORDER — INSULIN LISPRO 100 UNIT/ML ~~LOC~~ SOLN
0.0000 [IU] | Freq: Three times a day (TID) | SUBCUTANEOUS | Status: DC
  Filled 2012-11-28: qty 3

## 2012-11-28 MED ORDER — ENOXAPARIN SODIUM 40 MG/0.4ML ~~LOC~~ SOLN
40.0000 mg | SUBCUTANEOUS | Status: DC
  Filled 2012-11-28 (×3): qty 0.4

## 2012-11-28 MED ORDER — FUROSEMIDE 20 MG PO TABS: 20.0000 mg | ORAL_TABLET | ORAL | Status: DC

## 2012-11-28 MED ORDER — DOCUSATE SODIUM 50 MG PO CAPS
50.0000 mg | ORAL_CAPSULE | Freq: Two times a day (BID) | ORAL | Status: DC
  Administered 2012-11-28 – 2012-11-30 (×4): 50 mg via ORAL
  Filled 2012-11-28 (×5): qty 1

## 2012-11-28 MED ORDER — ASPIRIN EC 325 MG PO TBEC
325.0000 mg | DELAYED_RELEASE_TABLET | Freq: Every day | ORAL | Status: DC
  Administered 2012-11-29 – 2012-11-30 (×2): 325 mg via ORAL
  Filled 2012-11-28 (×3): qty 1

## 2012-11-28 MED ORDER — SODIUM CHLORIDE 0.9 % IV SOLN: INTRAVENOUS | Status: AC

## 2012-11-28 MED ORDER — INSULIN ASPART 100 UNIT/ML ~~LOC~~ SOLN
0.0000 [IU] | Freq: Three times a day (TID) | SUBCUTANEOUS | Status: DC
  Administered 2012-11-29 (×2): 3 [IU] via SUBCUTANEOUS
  Administered 2012-11-30: 2 [IU] via SUBCUTANEOUS

## 2012-11-28 MED ORDER — BRIMONIDINE TARTRATE 0.2 % OP SOLN
1.0000 [drp] | Freq: Two times a day (BID) | OPHTHALMIC | Status: DC
  Administered 2012-11-28 – 2012-11-29 (×2): 1 [drp] via OPHTHALMIC
  Filled 2012-11-28: qty 5

## 2012-11-28 MED ORDER — CYANOCOBALAMIN 1000 MCG/ML IJ SOLN: 1000.0000 ug | INTRAMUSCULAR | Status: DC

## 2012-11-28 MED ORDER — ATORVASTATIN CALCIUM 40 MG PO TABS
40.0000 mg | ORAL_TABLET | Freq: Every day | ORAL | Status: DC
  Administered 2012-11-28 – 2012-11-29 (×2): 40 mg via ORAL
  Filled 2012-11-28 (×3): qty 1

## 2012-11-28 MED ORDER — SODIUM CHLORIDE 0.9 % IV SOLN
INTRAVENOUS | Status: DC
  Administered 2012-11-28 – 2012-11-30 (×5): via INTRAVENOUS

## 2012-11-28 MED ORDER — EZETIMIBE 10 MG PO TABS
10.0000 mg | ORAL_TABLET | Freq: Every day | ORAL | Status: DC
  Administered 2012-11-28 – 2012-11-29 (×2): 10 mg via ORAL
  Filled 2012-11-28 (×3): qty 1

## 2012-11-28 MED ORDER — DIPHENHYDRAMINE HCL 25 MG PO CAPS
50.0000 mg | ORAL_CAPSULE | Freq: Once | ORAL | Status: AC
  Administered 2012-11-29: 50 mg via ORAL
  Filled 2012-11-28: qty 2

## 2012-11-28 MED ORDER — INSULIN GLARGINE 100 UNIT/ML ~~LOC~~ SOLN
10.0000 [IU] | Freq: Every day | SUBCUTANEOUS | Status: DC
  Administered 2012-11-28 – 2012-11-29 (×2): 10 [IU] via SUBCUTANEOUS

## 2012-11-28 MED ORDER — MUPIROCIN 2 % EX OINT
1.0000 "application " | TOPICAL_OINTMENT | Freq: Two times a day (BID) | CUTANEOUS | Status: DC
  Filled 2012-11-28: qty 22

## 2012-11-28 MED ORDER — FUROSEMIDE 40 MG PO TABS
40.0000 mg | ORAL_TABLET | Freq: Every day | ORAL | Status: DC
  Filled 2012-11-28 (×2): qty 1

## 2012-11-28 MED ORDER — VANCOMYCIN HCL 1000 MG IV SOLR
750.0000 mg | Freq: Two times a day (BID) | INTRAVENOUS | Status: DC
  Administered 2012-11-29: 750 mg via INTRAVENOUS
  Filled 2012-11-28 (×2): qty 750

## 2012-11-28 MED ORDER — FERROUS SULFATE ER 50 MG PO TBCR: 50.0000 mg | EXTENDED_RELEASE_TABLET | Freq: Every day | ORAL | Status: DC

## 2012-11-28 MED ORDER — PIPERACILLIN-TAZOBACTAM 3.375 G IVPB
3.3750 g | Freq: Three times a day (TID) | INTRAVENOUS | Status: DC
  Filled 2012-11-28: qty 50

## 2012-11-28 MED ORDER — LEVOTHYROXINE SODIUM 125 MCG PO TABS
125.0000 ug | ORAL_TABLET | Freq: Every day | ORAL | Status: DC
  Administered 2012-11-29 – 2012-11-30 (×2): 125 ug via ORAL
  Filled 2012-11-28 (×4): qty 1

## 2012-11-28 MED ORDER — POLYSACCHARIDE IRON COMPLEX 150 MG PO CAPS
150.0000 mg | ORAL_CAPSULE | Freq: Every day | ORAL | Status: DC
  Administered 2012-11-30: 150 mg via ORAL
  Filled 2012-11-28 (×3): qty 1
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
## 2012-11-28 NOTE — ED Notes (Signed)
cbg is 65 pt. Feels a little shaky, gave pt. A  Box lunch and drink.

## 2012-11-28 NOTE — ED Provider Notes (Signed)
History     CSN: [id number]  Arrival date & time 11/28/12  1533   First MD Initiated Contact with Patient 11/28/12 1723      Chief Complaint  Patient presents with  . Cellulitis    (Consider location/radiation/quality/duration/timing/severity/associated sxs/prior treatment) Patient is a 67 y.o. female presenting with leg pain. The history is provided by the patient and medical records. No language interpreter was used.  Leg Pain Location:  Leg Time since incident:  4 days (Patient noticed redness and swelling of the left leg on Monday, 4 days ago. Her temperature was 100.6.) Injury: no   Leg location:  L lower leg Pain details:    Quality:  Aching   Radiates to:  Does not radiate   Severity:  Mild   Onset quality:  Gradual   Duration:  4 days   Timing:  Constant   Subjective pain progression: She was seen by her hematologist, Dr. Drue Second, who advised her to seek inpatient treatment for cellulits on Tuesday, 3 days ago.  She went home to pack to come to the hospital, but was unable to return because of a severe snowstorm.   Chronicity:  Recurrent Dislocation: no   Foreign body present:  No foreign bodies Prior injury to area:  No Relieved by:  Nothing Worsened by:  Nothing tried Ineffective treatments:  None tried (She has had recurrent bouts of cellulitis requiring IV Vancomycin and Zosyn and hospitalization.) Associated symptoms: fever   Risk factors comment:  She is diabetic, has had prior transmetatarsal amputaion on this foot.   Past Medical History  Diagnosis Date  . HTN (hypertension)   . Hypothyroidism   . History of recurrent UTIs   . CAD (coronary artery disease)     s/p CABG x3 in 1993; last cath in 2010  . Scleroderma   . Bilateral carpal tunnel syndrome 1970s  . 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 Followed by [name]  . Claudication   . Aortic stenosis     0.8cm2 on echo in  10/2011  . Hyperlipidemia     on Lipitor  . Cat bite 2009    with MRSA; required I & D  . Heart murmur   . Blood transfusion     no reaction from transfusion; "when I had heart surgery" (08/28/2012)  . GERD (gastroesophageal reflux disease)   . CHF (congestive heart failure) Aug. 2012    Echo 10/2011: Mild LVH, EF 30%, apex akinetic, septal HK, grade 2 diastolic dysfunction, or functionally bicuspid aortic valve, mild aortic stenosis by gradient, severe by calculated AVA-suspect A. Aortic stenosis probably moderate, trivial MR, mild LAE, mild RAE.   [name] Cellulitis   . Anemia   . B12 deficiency anemia     B 12 injection "monthly since 05/2012" (08/28/2012)  . Iron deficiency anemia 06/13/2012    "iron infusion" (08/28/2012  . Anginal pain     "history; not currently" (08/28/2012)  . Myocardial infarction 1986    "silent"  . History of bronchitis     "I've had it twice in my life" (08/28/2012)  . Exertional dyspnea     "because of the heart failure" (08/28/2012)  . Type 1 diabetes 10/1949  . Stomach ulcer 1981?    "on Tagament for ~ 1 yr" (08/28/2012)  . Seizure     ? seizure like event in 2010; workup included stress testing which led to  repeat cath.   . Arthritis     "hands, hips, all over" (08/28/2012  . Osteoarthritis of both feet   . Breast cancer     left    Past Surgical History  Procedure Laterality Date  . Carpal tunnel release  ~ 1970's    bilaterally  . Mastectomy  11/1982    Left Breast  . Vitrectomy  1990's-2004    "both eyes; I've had total of 4"  . Tubal ligation  1984  . Tonsillectomy  1952    "?adenoids"   . Appendectomy  1991  . Femoral bypass      left leg "related to transmetatarsal amputation" (08/28/2012)  . Breast biopsy with sentinel lymph node biopsy and needle localization  1984  . Coronary artery bypass graft  1992    LIMA to LAD, SVG to distal LCX & SVG to Marginal  . Cardiac catheterization  2010    LIMA to LAD patent yet with occluded distal  LAD after graft insertion & retrograde is occluded. 3-4 septal perforators arise &are patent. SVG to a large marginal patent; SVG presumably to the distal dominant LCX is occluded, moderately high grade stenosis of the AV circumflex, but with distal stenoses involving a severely diseased marginal branch not amenable  to PCI  nor is inferior branch   . Cardiac catheterization  10/2011    "unsuccessful attempt of stenting" (08/28/2012)  . Cardiac catheterization  1992; 09/2011  . Breast biopsy    . Trigger finger release  1980's    right thumb  . Incision and drainage of wound  ~ 1967    "infection in left foot" (08/28/2012)  . Incision and drainage of wound  2009    "3 ORs on my right hand after cat bite" (08/28/2012)  . 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 Cataract extraction w/ intraocular lens  implant, bilateral  1990's    Family History  Problem Relation Age of Onset  . Diabetes Mother     History  Substance Use Topics  . Smoking status: Never Smoker   . Smokeless tobacco: Never Used  . Alcohol Use: No    OB History   Grav Para Term Preterm Abortions TAB SAB Ect Mult Living                  Review of Systems  Constitutional: Positive for fever.  HENT: Negative.   Eyes: Negative.   Respiratory: Negative.   Cardiovascular: Negative.        Prior CABG, prior CHF, no current symptoms.  Gastrointestinal: Negative.   Endocrine:       Insuliin-dependant diabetic for many years.  Genitourinary: Negative.   Musculoskeletal:       Left lower extremity swelling and redness.  Skin: Negative.   Neurological: Negative.   Psychiatric/Behavioral: Negative.     Allergies  Adhesive; Bactrim; Cephalexin; and Ciprofloxacin  Home Medications   Current Outpatient Rx  Name  Route  Sig  Dispense  Refill  . aspirin EC 325 MG EC tablet   Oral   Take 325 mg by mouth daily.           [name] atorvastatin (LIPITOR) 40 MG tablet   Oral   Take 40 mg by  mouth at bedtime.         . brimonidine (ALPHAGAN) 0.2 % ophthalmic solution   Both Eyes   Place 1 drop into both eyes 2 (two) times daily.           [name]  cyanocobalamin (,VITAMIN B-12,) 1000 MCG/ML injection   Intramuscular   Inject 1,000 mcg into the muscle every 30 (thirty) days.         [name] docusate sodium (COLACE) 50 MG capsule   Oral   Take by mouth 2 (two) times daily.         [name] ezetimibe (ZETIA) 10 MG tablet   Oral   Take 10 mg by mouth at bedtime.         . Ferrous Sulfate (SLOW RELEASE IRON) 50 MG TBCR   Oral   Take 50 mg by mouth daily.          . furosemide (LASIX) 20 MG tablet   Oral   Take 20 mg by mouth 2 (two) times a week. Takes 1 tablet on tuesdays and saturdays in addition with the 40mg          . furosemide (LASIX) 40 MG tablet   Oral   Take 40 mg by mouth daily.         . insulin glargine (LANTUS) 100 UNIT/ML injection      Take 20 units subcutaneously each morning and 10 units each evening.         . insulin lispro (HUMALOG PEN) 100 UNIT/ML injection   Subcutaneous   Inject 0-10 Units into the skin 4 (four) times daily. Per sliding scale DO NOT GIVE WITHOUT CONSULTING PATIENT ON HER SLIDING SCALE         . isosorbide mononitrate (IMDUR) 30 MG 24 hr tablet   Oral   Take 30 mg by mouth daily.         [name] latanoprost (XALATAN) 0.005 % ophthalmic solution   Both Eyes   Place 1 drop into both eyes at bedtime.           [name] levothyroxine (SYNTHROID, LEVOTHROID) 125 MCG tablet   Oral   Take 125 mcg by mouth daily.          . metoprolol succinate (TOPROL-XL) 25 MG 24 hr tablet   Oral   Take 25 mg by mouth daily.           BP 125/51  Pulse 77  Temp(Src) 98 F (36.7 C) (Oral)  Resp 18  SpO2 98%  Physical Exam  Nursing note and vitals reviewed. Constitutional: She is oriented to person, place, and time. She appears well-developed and well-nourished. No distress.  HENT:  Head: Normocephalic and atraumatic.  Right Ear:  External ear normal.  Left Ear: External ear normal.  Mouth/Throat: Oropharynx is clear and moist.  Eyes: Conjunctivae and EOM are normal. Pupils are equal, round, and reactive to light.  Cardiovascular: Normal rate, regular rhythm and normal heart sounds.   Pulmonary/Chest: Effort normal and breath sounds normal.  Well healed median sternotomy scar.  Abdominal: Soft. Bowel sounds are normal.  Musculoskeletal:  She has a left foot transmetatarsal amputation that is well healed.  She has cellulitis of the left foot and calf, with a lympangitic streak extending 3 cm above the left knee.   Neurological: She is alert and oriented to person, place, and time.  No sensory or motor deficit.  Skin: Skin is warm and dry.  Psychiatric: She has a normal mood and affect. Her behavior is normal.    ED Course  Procedures (including critical care time)  Labs Reviewed  CBC WITH DIFFERENTIAL - Abnormal; Notable for the following:    RBC 3.77 (*)    HCT 35.2 (*)    Eosinophils Relative  11 (*)    All other components within normal limits  BASIC METABOLIC PANEL - Abnormal; Notable for the following:    Sodium 134 (*)    Glucose, Bld 123 (*)    BUN 39 (*)    Creatinine, Ser 1.26 (*)    GFR calc non Af Amer 43 (*)    GFR calc Af Amer 50 (*)    All other components within normal limits  GLUCOSE, CAPILLARY - Abnormal; Notable for the following:    Glucose-Capillary 65 (*)    All other components within normal limits  CULTURE, BLOOD (ROUTINE X 2)  CULTURE, BLOOD (ROUTINE X 2)  CBC WITH DIFFERENTIAL  COMPREHENSIVE METABOLIC PANEL  URINALYSIS, ROUTINE W REFLEX MICROSCOPIC   6:28 PM Pt was seen and physical exam performed.  Old charts were reviewed.  Lab workup was ordered.  IV Vancomycin and Zosyn were ordered.  Call to Internal Medicine Teaching Service B to see and admit pt.  8:24 PM  Date: 11/28/2012  Rate:76  Rhythm: normal sinus rhythm  QRS Axis: right  Intervals: normal QRS:  Poor R wave  progression in precordial leads suggests old anterior myocardial infarction.  ST/T Wave abnormalities: nonspecific T wave changes  Conduction Disutrbances:none  Narrative Interpretation: Abnormal EKG  Old EKG Reviewed: unchanged    1. Cellulitis of left lower leg   2. Diabetes mellitus   3. Cellulitis of left leg without foot            Carleene Cooper III, MD 12/01/12 1430

## 2012-11-28 NOTE — ED Notes (Signed)
Outline of redness on leg marked on pt.

## 2012-11-28 NOTE — Progress Notes (Signed)
Pt arrived to unit. Pt is A&O, VS Stable and skin in tact other than LLE cellulitis.  Pt is now resting comfortably in bed.

## 2012-11-28 NOTE — ED Notes (Addendum)
Pt states she is having a recurrence of cellulitis in her left leg. Started running a fever since Monday night. Hasn't run a fever since earlier in the week.

## 2012-11-28 NOTE — ED Notes (Signed)
Report given to 5500 rn.  Pt transported upstairs on stretcher and monitor.

## 2012-11-28 NOTE — ED Notes (Signed)
Iv nurse notified, pt. Has requested the IV nurse.

## 2012-11-28 NOTE — ED Notes (Addendum)
Pt. Eating dinner, denies any pain or discomfort.

## 2012-11-28 NOTE — H&P (Signed)
Hospital Admission Note Date: 11/28/2012  Patient name: [name] Medical record number: [id number] Date of birth: September 03, 1946 Age: 67 y.o. Gender: female PCP: [name], MD  Medical Service: [facility]  Attending physician: Dr. Dalphine Handing   1st Contact: Dr. Burtis Junes   Pager:[phone] 2nd Contact: Dr. Dierdre Searles    Pager:[phone] After 5 pm or weekends: 1st Contact:  Intern on call   Pager: [phone] 2nd Contact:  Resident on call  Pager: [phone]  Chief Complaint: LLE erythema and swelling x 5 days  History of Present Illness: [name] is a 67 year old female with multiple medical problems including CHF, brittle type 1 diabetes, peripheral vascular disease s/p LE bypass surgery, coronary artery disease status post CABG x4, and history of recurrent right lower extremity cellulitis presenting with persistent complaints of left lower extremity redness, swelling, erythema.  She says that she noticed pain, redness, and swelling of her left lower leg from above the ankle to below the knee starting on Monday. She says that the redness and pain were at their worst Wednesday or Thursday, but seem to be improving today. Five days ago at first presentation, she measured temperature at home which was 100.6. She has not had any other measured fevers at home since then. She does not have any joint pain or swelling of the left ankle or knee. She has had admissions for lower extremity cellulitis in the past, in July (right leg) and November (left leg) of this year.  She denies fever, chills, nausea, vomiting, diarrhea, dizziness, lightheadedness, headache, chest pain, shortness of breath, palpitations, swelling of right lower extremity, numbness, tingling, joint swelling, dysuria, change in bowel movements, hematochezia, melena.   Meds: Prior to Admission medications   Medication Sig Start Date End Date Taking? Authorizing Provider  aspirin EC 325 MG EC tablet Take 325 mg by mouth daily.     Yes  Historical Provider, MD  atorvastatin (LIPITOR) 40 MG tablet Take 40 mg by mouth at bedtime.   Yes Historical Provider, MD  brimonidine (ALPHAGAN) 0.2 % ophthalmic solution Place 1 drop into both eyes 2 (two) times daily.     Yes Historical Provider, MD  cyanocobalamin (,VITAMIN B-12,) 1000 MCG/ML injection Inject 1,000 mcg into the muscle every 30 (thirty) days.   Yes Historical Provider, MD  docusate sodium (COLACE) 50 MG capsule Take by mouth 2 (two) times daily.   Yes Historical Provider, MD  ezetimibe (ZETIA) 10 MG tablet Take 10 mg by mouth at bedtime.   Yes Historical Provider, MD  Ferrous Sulfate (SLOW RELEASE IRON) 50 MG TBCR Take 50 mg by mouth daily.    Yes Historical Provider, MD  furosemide (LASIX) 20 MG tablet Take 20 mg by mouth 2 (two) times a week. Takes 1 tablet on tuesdays and saturdays in addition with the 40mg    Yes Historical Provider, MD  furosemide (LASIX) 40 MG tablet Take 40 mg by mouth daily.   Yes Historical Provider, MD  insulin glargine (LANTUS) 100 UNIT/ML injection Take 20 units subcutaneously each morning and 10 units each evening.   Yes Historical Provider, MD  insulin lispro (HUMALOG PEN) 100 UNIT/ML injection Inject 0-10 Units into the skin 4 (four) times daily. Per sliding scale DO NOT GIVE WITHOUT CONSULTING PATIENT ON HER SLIDING SCALE   Yes Historical Provider, MD  isosorbide mononitrate (IMDUR) 30 MG 24 hr tablet Take 30 mg by mouth daily.   Yes Historical Provider, MD  latanoprost (XALATAN) 0.005 % ophthalmic solution Place 1 drop into both eyes at  bedtime.     Yes Historical Provider, MD  levothyroxine (SYNTHROID, LEVOTHROID) 125 MCG tablet Take 125 mcg by mouth daily.    Yes Historical Provider, MD  metoprolol succinate (TOPROL-XL) 25 MG 24 hr tablet Take 25 mg by mouth daily.   Yes Historical Provider, MD    Allergies: Allergies as of 11/28/2012 - Review Complete 11/28/2012  Allergen Reaction Noted  . Adhesive (tape) Other (See Comments) 06/11/2011  .  Bactrim (sulfamethoxazole w-trimethoprim)  09/05/2012  . Cephalexin Swelling and Rash 02/03/2009  . Ciprofloxacin Swelling and Rash 02/03/2009   Past Medical History  Diagnosis Date  . HTN (hypertension)   . Hypothyroidism   . History of recurrent UTIs   . CAD (coronary artery disease)     s/p CABG x3 in 1993; last cath in 2010  . Scleroderma   . Bilateral carpal tunnel syndrome 1970s  . 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 Followed by [name]  . Claudication   . Aortic stenosis     0.8cm2 on echo in 10/2011  . Hyperlipidemia     on Lipitor  . Cat bite 2009    with MRSA; required I & D  . Heart murmur   . Blood transfusion     no reaction from transfusion; "when I had heart surgery" (08/28/2012)  . GERD (gastroesophageal reflux disease)   . CHF (congestive heart failure) Aug. 2012    Echo 10/2011: Mild LVH, EF 30%, apex akinetic, septal HK, grade 2 diastolic dysfunction, or functionally bicuspid aortic valve, mild aortic stenosis by gradient, severe by calculated AVA-suspect A. Aortic stenosis probably moderate, trivial MR, mild LAE, mild RAE.   [name] Cellulitis   . Anemia   . B12 deficiency anemia     B 12 injection "monthly since 05/2012" (08/28/2012)  . Iron deficiency anemia 06/13/2012    "iron infusion" (08/28/2012  . Anginal pain     "history; not currently" (08/28/2012)  . Myocardial infarction 1986    "silent"  . History of bronchitis     "I've had it twice in my life" (08/28/2012)  . Exertional dyspnea     "because of the heart failure" (08/28/2012)  . Type 1 diabetes 10/1949  . Stomach ulcer 1981?    "on Tagament for ~ 1 yr" (08/28/2012)  . Seizure     ? seizure like event in 2010; workup included stress testing which led to repeat cath.   . Arthritis     "hands, hips, all over" (08/28/2012  . Osteoarthritis of both feet   . Breast cancer     left   Past Surgical History  Procedure Laterality  Date  . Carpal tunnel release  ~ 1970's    bilaterally  . Mastectomy  11/1982    Left Breast  . Vitrectomy  1990's-2004    "both eyes; I've had total of 4"  . Tubal ligation  1984  . Tonsillectomy  1952    "?adenoids"   . Appendectomy  1991  . Femoral bypass      left leg "related to transmetatarsal amputation" (08/28/2012)  . Breast biopsy with sentinel lymph node biopsy and needle localization  1984  . Coronary artery bypass graft  1992    LIMA to LAD, SVG to distal LCX & SVG to Marginal  . Cardiac catheterization  2010    LIMA to LAD patent yet with occluded distal LAD after graft insertion &  retrograde is occluded. 3-4 septal perforators arise &are patent. SVG to a large marginal patent; SVG presumably to the distal dominant LCX is occluded, moderately high grade stenosis of the AV circumflex, but with distal stenoses involving a severely diseased marginal branch not amenable  to PCI  nor is inferior branch   . Cardiac catheterization  10/2011    "unsuccessful attempt of stenting" (08/28/2012)  . Cardiac catheterization  1992; 09/2011  . Breast biopsy    . Trigger finger release  1980's    right thumb  . Incision and drainage of wound  ~ 1967    "infection in left foot" (08/28/2012)  . Incision and drainage of wound  2009    "3 ORs on my right hand after cat bite" (08/28/2012)  . 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 Cataract extraction w/ intraocular lens  implant, bilateral  1990's   Family History  Problem Relation Age of Onset  . Diabetes Mother    History   Social History  . Marital Status: Single    Spouse Name: N/A    Number of Children: N/A  . Years of Education: N/A   Occupational History  . Environmental health practitioner    Social History Main Topics  . Smoking status: Never Smoker   . Smokeless tobacco: Never Used  . Alcohol Use: No  . Drug Use: No  . Sexually Active: Not Currently    Birth Control/ Protection: Post-menopausal    Other Topics Concern  . Not on file   Social History Narrative  . No narrative on file    Review of Systems: 10 pt ROS performed, pertinent positives and negatives noted in HPI  Physical Exam: Blood pressure 123/70, pulse 64, temperature 98 F (36.7 C), temperature source Oral, resp. rate 16, height 5\' 4"  (1.626 m), weight 150 lb 9.6 oz (68.312 kg), SpO2 98.00%. Vitals reviewed.  General: resting in bed, NAD  HEENT: PERRL, EOMI, no scleral icterus  Cardiac: Harsh, 3/6 systolic murmur heard best in RUSB. RRR, no rubs or gallops  Pulm: clear to auscultation bilaterally, no wheezes, rales, or rhonchi  Abd: soft, nontender, nondistended, BS present  Ext: Foot stump of L LE is without erythema and is well perfused. 2+ edema and circumferential edema of L lower extremity from above ankle to below knee.  Mildly TTP. No abscess formation or necrosis. Multiple surgical scars bilaterally from L peripheral bypass and R sided vein harvesting.  Neuro: alert and oriented X3, cranial nerves II-XII grossly intact, strength and sensation to light touch equal in bilateral upper and lower extremities  Lab results: Basic Metabolic Panel:  Recent Labs  16/10/96 1624 11/28/12 1900  NA 134* 137  K 4.4 4.4  CL 99 100  CO2 28 26  GLUCOSE 123* 104*  BUN 39* 37*  CREATININE 1.26* 1.19*  CALCIUM 9.9 9.9   Liver Function Tests:  Recent Labs  11/28/12 1900  AST 42*  ALT 38*  ALKPHOS 167*  BILITOT 0.7  PROT 8.1  ALBUMIN 3.5   CBC:  Recent Labs  11/28/12 1624 11/28/12 1900  WBC 6.5 6.9  NEUTROABS 4.0 4.3  HGB 12.1 12.8  HCT 35.2* 36.9  MCV 93.4 93.7  PLT 200 219   CBG:  Recent Labs  11/28/12 1739 11/28/12 2122  GLUCAP 65* 231*   Anemia Panel:  Recent Labs  11/26/12 1132  VITAMINB12 612  FERRITIN 240  TIBC 237*  IRON 44   Urinalysis:  Recent Labs  11/28/12 1947  COLORURINE YELLOW  LABSPEC 1.010  PHURINE 5.5  GLUCOSEU NEGATIVE  HGBUR NEGATIVE  BILIRUBINUR  NEGATIVE  KETONESUR NEGATIVE  PROTEINUR NEGATIVE  UROBILINOGEN 0.2  NITRITE NEGATIVE  LEUKOCYTESUR NEGATIVE   Imaging results:  Dg Chest 2 View  11/28/2012  *RADIOLOGY REPORT*  Clinical Data: Fever.  Leg cellulitis.  CHEST - 2 VIEW  Comparison: 11/15/2012  Findings: Prior CABG.  Heart is borderline in size.  Lungs are clear.  No effusions or acute bony abnormality.  IMPRESSION: No acute cardiopulmonary disease.   Original Report Authenticated By: Charlett Nose, M.D.    Other results: EKG: Sinus rhythm with rate 76, normal intervals, RAD, partial LBBB, nonspecific ST segment changes. Compared to prior 09/04/13, no significant changes.   Assessment & Plan by Problem: Ms. Blasco is a 68 year old female with vasculopathy presenting with recurrent left lower extremity cellulitis.  1. Cellulitis of left leg without foot Patient has circumferential erythema, edema and slight pain of left lower extremity from ankle to below the knee. No evidence of abscess or necrosis. Good perfusion of left foot stump. No evidence of systemic infection, patient is without fever or leukocytosis and is normotensive. No evidence of septic arthritis or osteomyelitis. Per patient, symptoms are improving.  Was given 1 dose IV vanc and zosyn in ED.  In the past, has failed outpatient therapy with doxycycline and had worsening of CHF with bactrim. Has responded to po clindamycin after recent admission, as well as po linezolid after admission in July.   -Will continue IV vancomycin and watch her overnight  - Follow a.m. CBC   2. Type 1 diabetes  Patient is brittle diabetic. Last Hb A1c 10.2 in 06/13.  - Will continue home lantus dose and TID humalog with meals.  - CBGs w meals and qhs   3. CHF  Recent EF 30% in January 2013. Stable, no evidence of volume overload on examination. Pt w/o chest pain or SOB. Followed by [facility] ([name]), last appointment was earlier in January.  - Continue home lasix, metoprolol    4. HTN  Normotensive at 123/70.  - Continue home antihypertensives  Dispo: Disposition is deferred at this time, awaiting improvement of current medical problems. Anticipated discharge in approximately 1-2 day(s).   The patient does have a current PCP ([facility], Brien Few, MD), therefore will be requiring [facility] follow-up after discharge.   The patient does not have transportation limitations that hinder transportation to clinic appointments.  Signed: Bronson Curb 11/28/2012, 9:43 PM

## 2012-11-28 NOTE — ED Notes (Signed)
ekg done earlier placed on chart.

## 2012-11-29 DIAGNOSIS — M79609 Pain in unspecified limb: Secondary | ICD-10-CM

## 2012-11-29 DIAGNOSIS — L02419 Cutaneous abscess of limb, unspecified: Secondary | ICD-10-CM | POA: Diagnosis not present

## 2012-11-29 DIAGNOSIS — L03119 Cellulitis of unspecified part of limb: Principal | ICD-10-CM

## 2012-11-29 DIAGNOSIS — M7989 Other specified soft tissue disorders: Secondary | ICD-10-CM

## 2012-11-29 LAB — CBC
HCT: 32 % — ABNORMAL LOW (ref 36.0–46.0)
Hemoglobin: 10.8 g/dL — ABNORMAL LOW (ref 12.0–15.0)
MCH: 31.7 pg (ref 26.0–34.0)
MCHC: 33.8 g/dL (ref 30.0–36.0)
MCV: 93.8 fL (ref 78.0–100.0)
Platelets: 187 K/uL (ref 150–400)
RBC: 3.41 MIL/uL — ABNORMAL LOW (ref 3.87–5.11)
RDW: 13.7 % (ref 11.5–15.5)
WBC: 6.3 K/uL (ref 4.0–10.5)

## 2012-11-29 LAB — BASIC METABOLIC PANEL WITH GFR
BUN: 38 mg/dL — ABNORMAL HIGH (ref 6–23)
CO2: 24 meq/L (ref 19–32)
Calcium: 9 mg/dL (ref 8.4–10.5)
Chloride: 101 meq/L (ref 96–112)
Creatinine, Ser: 1.4 mg/dL — ABNORMAL HIGH (ref 0.50–1.10)
GFR calc Af Amer: 44 mL/min — ABNORMAL LOW
GFR calc non Af Amer: 38 mL/min — ABNORMAL LOW
Glucose, Bld: 112 mg/dL — ABNORMAL HIGH (ref 70–99)
Potassium: 5 meq/L (ref 3.5–5.1)
Sodium: 136 meq/L (ref 135–145)

## 2012-11-29 LAB — HIV ANTIBODY (ROUTINE TESTING W REFLEX): HIV: NONREACTIVE

## 2012-11-29 LAB — GLUCOSE, CAPILLARY
Glucose-Capillary: 246 mg/dL — ABNORMAL HIGH (ref 70–99)
Glucose-Capillary: 120 mg/dL — ABNORMAL HIGH (ref 70–99)

## 2012-11-29 MED ORDER — SODIUM CHLORIDE 0.9 % IV SOLN
750.0000 mg | INTRAVENOUS | Status: DC
  Administered 2012-11-30: 750 mg via INTRAVENOUS
  Filled 2012-11-29 (×2): qty 750

## 2012-11-29 MED ORDER — INSULIN ASPART 100 UNIT/ML ~~LOC~~ SOLN
1.0000 [IU] | Freq: Once | SUBCUTANEOUS | Status: AC
  Administered 2012-11-29: 1 [IU] via SUBCUTANEOUS

## 2012-11-29 MED ORDER — FUROSEMIDE 40 MG PO TABS
40.0000 mg | ORAL_TABLET | Freq: Every day | ORAL | Status: DC
  Administered 2012-11-29: 40 mg via ORAL
  Filled 2012-11-29 (×2): qty 1

## 2012-11-29 MED ORDER — FUROSEMIDE 20 MG PO TABS
20.0000 mg | ORAL_TABLET | ORAL | Status: DC
  Administered 2012-11-29: 20 mg via ORAL
  Filled 2012-11-29: qty 1

## 2012-11-29 NOTE — Progress Notes (Signed)
VASCULAR LAB PRELIMINARY  PRELIMINARY  PRELIMINARY  PRELIMINARY  Bilateral lower extremity venous duplex  completed.    Preliminary report:  Bilateral:  No obvious evidence of DVT, superficial thrombosis, or Baker's Cyst.  Unable to perform compressions due to pain.  Calf vessels not adequately visualized.   Suggest repeat exam if DVT is still questioned when pain and swelling from cellulitis have decreased  Jveon Pound, RVT 11/29/2012, 3:37 PM

## 2012-11-29 NOTE — Progress Notes (Signed)
Patient fall risk is at a 7 and patient refuses to have bed alarm on.
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
## 2012-11-29 NOTE — Progress Notes (Signed)
UR completed 

## 2012-11-29 NOTE — Progress Notes (Signed)
Medical Student Daily Progress Note  Subjective: Patient had no new complaint.  She did not report increase in leg pain, and can bear weight on leg. She denies fevers, chills, and SOB.  Objective: Vital signs in last 24 hours: Filed Vitals:   11/28/12 1952 11/28/12 2117 11/28/12 2235 11/29/12 0504  BP: 155/108 123/70  101/60  Pulse: 83 64  60  Temp:  98 F (36.7 C)  98.5 F (36.9 C)  TempSrc:  Oral  Oral  Resp: 20 16  20   Height:  5\' 4"  (1.626 m)    Weight:  68.312 kg (150 lb 9.6 oz) 69.627 kg (153 lb 8 oz)   SpO2: 93% 98%  96%   Weight change:   Intake/Output Summary (Last 24 hours) at 11/29/12 0847 Last data filed at 11/28/12 2200  Gross per 24 hour  Intake 160.42 ml  Output      0 ml  Net 160.42 ml   Physical Exam: BP 101/60  Pulse 60  Temp(Src) 98.5 F (36.9 C) (Oral)  Resp 20  Ht 5\' 4"  (1.626 m)  Wt 69.627 kg (153 lb 8 oz)  BMI 26.34 kg/m2  SpO2 96%  General Appearance:    Alert, cooperative, no distress, appears stated age  Head:    Normocephalic, without obvious abnormality, atraumatic  Eyes:    PERRL, conjunctiva/corneas clear, EOM's intact, fundi    benign, both eyes  Ears:    Normal TM's and external ear canals, both ears  Nose:   Nares normal, septum midline, mucosa normal, no drainage    or sinus tenderness  Throat:   Lips, mucosa, and tongue normal; teeth and gums normal  Neck:   Supple, symmetrical, trachea midline, no adenopathy;    thyroid:  no enlargement/tenderness/nodules; no carotid   bruit or JVD  Back:     Symmetric, no curvature, ROM normal, no CVA tenderness  Lungs:     Clear to auscultation bilaterally, respirations unlabored  Chest Wall:    No tenderness or deformity   Heart:    Regular rate and rhythm, S1 and S2 normal, no murmur, rub   or gallop  Abdomen:     Soft, non-tender, bowel sounds active all four quadrants,    no masses, no organomegaly  Extremities:   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 Cellulitis retracted about 1-2cm from previously drawn line, pain elicited on squeezing the calf and feet. Left leg: erythema, warmth, skin tightening, mild edema. Slight pain when squeezed, but not as erythematous as right leg.   Pulses:   2+ radial pulses, but 1+ posterior tibial pulses  Skin:   Red, peeling skin in lower extremities   Lab Results:  BMET Milany Geck: 136 K: 5.0 Cl: 101 CO2: 24 BUN: 38 Cr: 1.4 Gluc: 112 Ca: 9.0  CBC WBC: 6.3 Hgb: 3.41 Hct: 32 Plt: 187  Micro Results: Recent Results (from the past 240 hour(s))  MRSA PCR SCREENING     Status: Abnormal   Collection Time    11/28/12  9:32 PM      Result Value Range Status   MRSA by PCR POSITIVE (*) NEGATIVE Final   Comment:            The GeneXpert MRSA Assay (FDA     approved for NASAL specimens     only), is one component of a     comprehensive MRSA colonization  surveillance program. It is not     intended to diagnose MRSA     infection nor to guide or     monitor treatment for     MRSA infections.     RESULT CALLED TO, READ BACK BY AND VERIFIED WITHMathews Robinsons [id number] AT 2334 BY [facility]   Studies/Results: Dg Chest 2 View  11/28/2012  *RADIOLOGY REPORT*  Clinical Data: Fever.  Leg cellulitis.  CHEST - 2 VIEW  Comparison: 11/15/2012  Findings: Prior CABG.  Heart is borderline in size.  Lungs are clear.  No effusions or acute bony abnormality.  IMPRESSION: No acute cardiopulmonary disease.   Original Report Authenticated By: Charlett Nose, M.D.    Medications: I have reviewed the patient's current medications. Scheduled Meds: . sodium chloride   Intravenous STAT  . aspirin EC  325 mg Oral Daily  . atorvastatin  40 mg Oral q1800  . brimonidine  1 drop Both Eyes BID  . Chlorhexidine Gluconate Cloth  6 each Topical Q0600  . [START ON 12/19/2012] cyanocobalamin  1,000 mcg Intramuscular Q30 days  . docusate sodium  50 mg Oral BID   . enoxaparin (LOVENOX) injection  40 mg Subcutaneous Q24H  . ezetimibe  10 mg Oral QHS  . [START ON 12/01/2012] furosemide  20 mg Oral 2 times weekly  . furosemide  40 mg Oral Q breakfast  . insulin aspart  0-9 Units Subcutaneous TID WC  . insulin glargine  10 Units Subcutaneous QHS  . insulin glargine  20 Units Subcutaneous Daily  . iron polysaccharides  150 mg Oral Daily  . isosorbide mononitrate  30 mg Oral Daily  . latanoprost  1 drop Both Eyes QHS  . levothyroxine  125 mcg Oral QAC breakfast  . metoprolol succinate  25 mg Oral Daily  . mupirocin ointment  1 application Nasal BID  . vancomycin  750 mg Intravenous Q12H   Continuous Infusions: . sodium chloride 125 mL/hr at 11/29/12 0638   PRN Meds:. Assessment/Plan: Principal Problem:   Cellulitis of left leg without foot 1. Cellulitis of left leg without toes and metatarsals Patient's cellulitis appears to be improving because the erythema is regressing. Her ability to plantarflex and dorsiflex foot without pain and able to bear weight makes [place] less concerned about any myositis or osteomyelitis. She does not have any ulcers or open wounds, which makes osteomyelitis less likely. Patient still has pain in calf when squeezed, which makes [place] concerned for DVT.  -LE venous duplex -Continue IV vancomycin 750mg , will change to clindamycin PO for discharge. -Monitor spread of cellulitis  2. Type 1 diabetes, hypothyroidism, CAD, anemia, HTN, and other chronic conditions Patient is stable and will continue on outpatient regimen for these conditions.    LOS: 1 day   This is a Psychologist, occupational Note.  The care of the patient was discussed with Dr. Dierdre Searles and the assessment and plan formulated with their assistance.  Please see their attached note for official documentation of the daily encounter.  Rhea Pink 11/29/2012, 8:47 AM  Resident Co-sign Daily Note: I have seen the patient and reviewed the daily progress note by MS 3 Rhea Pink  and  discussed the care of the patient with them.  See below for documentation of my findings, assessment, and plans.  Subjective: Doing better. Less pain and redness on left leg cellulitis. No ulcers noted on LE. Callous noted on B/L feet.   Objective: Vital signs in last 24 hours: Filed  Vitals:   11/28/12 1952 11/28/12 2117 11/28/12 2235 11/29/12 0504  BP: 155/108 123/70  101/60  Pulse: 83 64  60  Temp:  98 F (36.7 C)  98.5 F (36.9 C)  TempSrc:  Oral  Oral  Resp: 20 16  20   Height:  5\' 4"  (1.626 m)    Weight:  150 lb 9.6 oz (68.312 kg) 153 lb 8 oz (69.627 kg)   SpO2: 93% 98%  96%   Physical Exam: General: alert, well-developed, and cooperative to examination.   Neck: supple, full ROM, no thyromegaly, no JVD, and no carotid bruits.  Lungs: normal respiratory effort, no accessory muscle use, normal breath sounds, no crackles, and no wheezes. Heart: normal rate, regular rhythm, no murmur, no gallop, and no rub.  Abdomen: soft, non-tender, normal bowel sounds, no distention, no guarding, no rebound tenderness, no hepatomegaly, and no splenomegaly.  Pulses: 2+ DP/PT pulses bilaterally Extremities: Bilateral LE chronic edema ( from her CABG surgery). LE swelling and erythema up to left knee with  warmth to touch and tenderness to touch. Her erythema is marked and better today. B/L feet callous skin noted. Remote left toe amputation noted. No ulcers noted.  Neurologic: alert & oriented X3, cranial nerves II-XII intact, strength normal in all extremities, sensation intact to light touch, and gait normal.  Skin: turgor normal and no rashes.  Psych: Oriented X3, memory intact for recent and remote, normally interactive, good eye contact, not anxious appearing, and not depressed appearing.   Lab Results: Reviewed and documented in Electronic Record Micro Results: Reviewed and documented in Electronic Record Studies/Results: Reviewed and documented in Electronic Record Medications: I have  reviewed the patient's current medications. Scheduled Meds: . sodium chloride   Intravenous STAT  . aspirin EC  325 mg Oral Daily  . atorvastatin  40 mg Oral q1800  . brimonidine  1 drop Both Eyes BID  . Chlorhexidine Gluconate Cloth  6 each Topical Q0600  . [START ON 12/19/2012] cyanocobalamin  1,000 mcg Intramuscular Q30 days  . docusate sodium  50 mg Oral BID  . enoxaparin (LOVENOX) injection  40 mg Subcutaneous Q24H  . ezetimibe  10 mg Oral QHS  . furosemide  20 mg Oral 2 times weekly  . furosemide  40 mg Oral Q breakfast  . insulin aspart  0-9 Units Subcutaneous TID WC  . insulin glargine  10 Units Subcutaneous QHS  . insulin glargine  20 Units Subcutaneous Daily  . iron polysaccharides  150 mg Oral Daily  . isosorbide mononitrate  30 mg Oral Daily  . latanoprost  1 drop Both Eyes QHS  . levothyroxine  125 mcg Oral QAC breakfast  . metoprolol succinate  25 mg Oral Daily  . mupirocin ointment  1 application Nasal BID  . [START ON 11/30/2012] vancomycin  750 mg Intravenous Q24H   Continuous Infusions: . sodium chloride 125 mL/hr at 11/29/12 0638   PRN Meds:. Assessment/Plan: #. Left LE cellulitis, improving    recurrent cellulitis in the setting of poorly controlled DM. No evidence of systemic infection. Afebrile and no leukocytosis.  In the past, has failed outpatient therapy with doxycycline and had worsening of CHF with bactrim. Has responded to po clindamycin after recent admission, as well as po linezolid after admission in July.   - will obtain LE Doppler to rule out DVT - Vancomycin per pharmacy - likely D/C in with Clindamycin - f/u as an outpatient - HIV pending  #. DM type 1, Last Hb A1c  10.2 in 06/13 - Will continue home lantus dose and TID humalog with meals. - add a 3 am blood sugar check - Hg A1C pending  3. CHF  Recent EF 30% in January 2013. Stable, no evidence of volume overload on examination. Pt w/o chest pain or SOB. Followed by [facility] (Dr.  Tenny Craw), last appointment was earlier in January.  - Continue metoprolol   4. HTN  Normotensive at 123/70.  - Continue home antihypertensives   5. CKD stage III   Slightly elevated Cr and GFR overnight, likely prerenal in the setting of decreased oral intake and home Lasix Caution on Vancomycin which can contribute to AKI.   - will hold lasix today - trend BMP in am  6. Lactic acid elevation, 2.27 on admission - mild elevation of lactic acid on admission likely associated with her acute cellulitis. No signs of sepsis.   -blood culture x 2 pending  - will trend her lactic acid in am.    7. EKG changes Lateral leads TWI noted on admission, which is chronic.  No S/s ACS.    Dispo: Disposition is deferred at this time, awaiting improvement of current medical problems. Anticipated discharge in approximately 1-2 day(s).  The patient does have a current PCP ([facility], Brien Few, MD), therefore will be requiring [facility]PC follow-up after discharge.  The patient does not have transportation limitations that hinder transportation to clinic appointments.       LOS: 1 day   Cozy Veale 11/29/2012, 2:30 PM

## 2012-11-29 NOTE — H&P (Signed)
Internal Medicine Teaching Service Attending Note Date: 11/29/2012  Patient name: [name]  Medical record number: [id number]  Date of birth: 1946-05-08    CHIEF COMPLAINT Swelling and redness in left leg for 5 days    HISTORY OF PRESENT ILLNESS The patient, [name], is a 67 y.o. year old female, with past medical history of diabetes type 1, CHF, PVD with bypass surgeries, and CABG who comes in with the chief  complaint of redness and swelling in her left calf for the past 5 days, increasing in size and painful. I have read the history documented by Dr.Zeimer and I concur with the chronology of events.   When I met with the patient today, she complained of lesser pain and reported the swelling going down a little bit. She denied any trauma to the leg. She had her first bout of cellulitis in her right leg in July 2013, and then she had a second episode in her left calf. Both of those resolved completely. She had no residual swelling, drainage, leg pain or discharge going on give days ago. This new episode is acute in nature and seems to be getting better already according to her. 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   She denied having any other symptoms of chest pain, palpitations, dizziness. She complained of shortness of breath which is chronic and nothing out of ordinary for her. She is very worried about how we are managing her insulin and despite reassurances she wants to manage it on her own.    PAST MEDICAL HISTORY  has a past medical history of HTN (hypertension); Hypothyroidism; History of recurrent UTIs; CAD (coronary artery disease); Scleroderma; Bilateral carpal tunnel syndrome (1970s); PVD (peripheral vascular disease); Claudication; Aortic stenosis; Hyperlipidemia; Cat bite (2009); Heart murmur; Blood transfusion; GERD (gastroesophageal reflux disease); CHF (congestive heart failure) (Aug. 2012); Cellulitis; Anemia; B12 deficiency  anemia; Iron deficiency anemia (06/13/2012); Anginal pain; Myocardial infarction (1986); History of bronchitis; Exertional dyspnea; Type 1 diabetes (10/1949); Stomach ulcer (1981?); Seizure; Arthritis; Osteoarthritis of both feet; and Breast cancer.    FAMILY AND SOCIAL HISTORY  reports that she has never smoked. She has never used smokeless tobacco. She reports that she does not drink alcohol or use illicit drugs. family history includes Diabetes in her mother.    REVIEW OF SYSTEMS  Constitutional:  Admits fatigue. Denies fever, chills, diaphoresis, appetite change.  Respiratory: Admits SOB, DOE (chronic). Denies cough, chest tightness, and wheezing.  Cardiovascular: Denies chest pain, palpitations.  Gastrointestinal: Denies nausea, vomiting, abdominal pain, diarrhea, constipation, blood in stool.  Genitourinary: Denies dysuria, urgency, frequency, hematuria, flank pain and difficulty urinating.  Skin: Wound minor right little toe. Swelling and redness of the LLE.  Neurological: Denies dizziness, seizures, syncope, weakness, light-headedness, numbness and headaches.   Hematological: Denies adenopathy, easy bruising, personal or family bleeding history.      SERIAL VITALS Filed Vitals:   11/28/12 1952 11/28/12 2117 11/28/12 2235 11/29/12 0504  BP: 155/108 123/70  101/60  Pulse: 83 64  60  Temp:  98 F (36.7 C)  98.5 F (36.9 C)  TempSrc:  Oral  Oral  Resp: 20 16  20   Height:  5\' 4"  (1.626 m)    Weight:  150 lb 9.6 oz (68.312 kg) 153 lb 8 oz (69.627 kg)   SpO2: 93% 98%  96%      PHYSICAL EXAM  I met with patient around 8:20 am today  General: Resting in  bed. HEENT: PERRL, EOMI, no scleral icterus. Heart: RRR, no rubs, gallops. Systolic murmur. Midline scar. Lungs: Clear to auscultation bilaterally, no wheezes, rales, or rhonchi. Abdomen: Soft, nontender, nondistended, BS present. Extremities:  LEFT: Left calf edema 2+, redness, decreased from the marked area on admission, not  tender to superficial touch, warmth, no open wounds, no drainage, scars from bypass surgeries. Pulse present. RIGHT: Scar from bypass harvesting. Skin break and circular blistery wound on right little toe which does not look infected. Pulse present. Neuro: Alert and oriented X3, cranial nerves II-XII grossly intact,  strength and sensation to light touch equal in bilateral upper and lower extremities    LAB RESULTS  CMP   Recent Labs Lab 11/28/12 1624 11/28/12 1900 11/29/12 0620  NA 134* 137 136  K 4.4 4.4 5.0  CL 99 100 101  CO2 28 26 24   GLUCOSE 123* 104* 112*  BUN 39* 37* 38*  CREATININE 1.26* 1.19* 1.40*  CALCIUM 9.9 9.9 9.0    CBC  Recent Labs Lab 11/28/12 1624 11/28/12 1900 11/29/12 0620  HGB 12.1 12.8 10.8*  HCT 35.2* 36.9 32.0*  WBC 6.5 6.9 6.3  PLT 200 219 187   LIVER FUNCTION  Recent Labs Lab 11/28/12 1900  AST 42*  ALT 38*  ALKPHOS 167*  BILITOT 0.7  PROT 8.1  ALBUMIN 3.5   GLUCOSE TREND  Recent Labs Lab 11/28/12 1739 11/28/12 2122 11/29/12 0036 11/29/12 0801  GLUCAP 65* 231* 230* 93   Urinalysis    Component Value Date/Time   COLORURINE YELLOW 11/28/2012 1947   APPEARANCEUR CLEAR 11/28/2012 1947   LABSPEC 1.010 11/28/2012 1947   PHURINE 5.5 11/28/2012 1947   GLUCOSEU NEGATIVE 11/28/2012 1947   HGBUR NEGATIVE 11/28/2012 1947   BILIRUBINUR NEGATIVE 11/28/2012 1947   KETONESUR NEGATIVE 11/28/2012 1947   PROTEINUR NEGATIVE 11/28/2012 1947   UROBILINOGEN 0.2 11/28/2012 1947   NITRITE NEGATIVE 11/28/2012 1947   LEUKOCYTESUR NEGATIVE 11/28/2012 1947     IMAGING RESULTS Dg Chest 2 View  11/28/2012  *RADIOLOGY REPORT*  Clinical Data: Fever.  Leg cellulitis.  CHEST - 2 VIEW  Comparison: 11/15/2012  Findings: Prior CABG.  Heart is borderline in size.  Lungs are clear.  No effusions or acute bony abnormality.  IMPRESSION: No acute cardiopulmonary disease.   Original Report Authenticated By: Charlett Nose, M.D.     EKG (11/29/11) @ 19:43 Normal Sinus,  Rate 76, Right axis, non-specific T wave changes, possible old septal infarct as per Q waves in V1-V3. It does not show any changes significant for Acute MI at this time.       ASSESSMENT AND PLAN This is a 67 y.o. year old female with uncontrolled diabetes and PVD, with repeated bouts of cellulitis in the past comes in with another episode of left leg swelling and edema, which most likely is acute cellulitis. She is has a history of being MRSA positive.  1. Cellulitis of the left leg, nasal MRSA positive - Agree with Vanco IV given the past history with antibiotics. Agree with discharge on PO clindamycin. I do not think at this point we need to do an MRI of left leg to rule out osteomyelitis, because the issue is fairly acute, and seems to be resolving. We will rule out left leg DVT. We will check her renal function regularly while she is on vancomycin.   2. Diabetes type 1.  We will aim for blood sugars between 140-180 for this patient. Given the brittle diabetes she has, I  would add a 3 am blood sugar check.   The patient wanted to manage her own blood sugars and wanted to decide her insulin dosage. She said she was okay with hypoglycemia, but would not want high blood sugars. She was not happy with the range of 140-180 that I informed her was our goal, however, given her brittle numbers we would be realistically aiming for 160-200. I denied her permission to give herself insulin. She was very angry and upset about it.    Recent Labs Lab 11/28/12 1739 11/28/12 2122 11/29/12 0036 11/29/12 0801  GLUCAP 65* 231* 230* 93   I have discussed this with the resident Na Li, and she would monitor the patient's blood sugars closely.   Other chronic issues per resident note.       Thanks, Aletta Edouard, MD 2/15/201410:10 AM

## 2012-11-30 DIAGNOSIS — M7989 Other specified soft tissue disorders: Secondary | ICD-10-CM

## 2012-11-30 DIAGNOSIS — M79609 Pain in unspecified limb: Secondary | ICD-10-CM | POA: Diagnosis not present

## 2012-11-30 DIAGNOSIS — L03119 Cellulitis of unspecified part of limb: Secondary | ICD-10-CM | POA: Diagnosis not present

## 2012-11-30 LAB — BASIC METABOLIC PANEL
CO2: 23 mEq/L (ref 19–32)
Calcium: 8.6 mg/dL (ref 8.4–10.5)
GFR calc non Af Amer: 36 mL/min — ABNORMAL LOW (ref >90)
Sodium: 136 mEq/L (ref 135–145)

## 2012-11-30 LAB — HEMOGLOBIN A1C: Mean Plasma Glucose: 220 mg/dL — ABNORMAL HIGH (ref <117)

## 2012-11-30 LAB — CBC
Platelets: 174 10*3/uL (ref 150–400)
RBC: 3.45 MIL/uL — ABNORMAL LOW (ref 3.87–5.11)
WBC: 5.5 10*3/uL (ref 4.0–10.5)

## 2012-11-30 LAB — LACTIC ACID, PLASMA: Lactic Acid, Venous: 0.8 mmol/L (ref 0.5–2.2)

## 2012-11-30 MED ORDER — CLINDAMYCIN HCL 300 MG PO CAPS: 300.0000 mg | ORAL_CAPSULE | Freq: Three times a day (TID) | ORAL | Status: DC

## 2012-11-30 MED ORDER — FUROSEMIDE 20 MG PO TABS
20.0000 mg | ORAL_TABLET | ORAL | Status: DC
  Filled 2012-11-30: qty 1

## 2012-11-30 MED ORDER — CLINDAMYCIN HCL 150 MG PO CAPS: 150.0000 mg | ORAL_CAPSULE | Freq: Three times a day (TID) | ORAL | Status: DC

## 2012-11-30 MED ORDER — CLINDAMYCIN HCL 300 MG PO CAPS: 300.0000 mg | ORAL_CAPSULE | Freq: Four times a day (QID) | ORAL | Status: DC

## 2012-11-30 MED ORDER — FUROSEMIDE 40 MG PO TABS
40.0000 mg | ORAL_TABLET | Freq: Every day | ORAL | Status: DC
  Administered 2012-11-30: 40 mg via ORAL
  Filled 2012-11-30: qty 1

## 2012-11-30 MED ORDER — CLINDAMYCIN HCL 150 MG PO CAPS: 150.0000 mg | ORAL_CAPSULE | Freq: Four times a day (QID) | ORAL | Status: DC

## 2012-11-30 NOTE — Progress Notes (Signed)
11/30/12 Patient to be discharged home today. IV site removed. Discharge instructions reviewed with patient.

## 2012-11-30 NOTE — Progress Notes (Signed)
VASCULAR LAB PRELIMINARY  PRELIMINARY  PRELIMINARY  PRELIMINARY  Bilateral lower extremity venous Dopplers completed.    Preliminary report:  No obvious evidence of DVT or SVT noted in the bilateral lower extremities.  Jessice Madill, RVT 11/30/2012, 12:01 PM

## 2012-11-30 NOTE — Progress Notes (Signed)
Subjective: Doing better. Less left LE pain, redness and swelling. No warmth to touch. No acute events overnight Objective: Vital signs in last 24 hours: Filed Vitals:   11/29/12 2142 11/30/12 0453 11/30/12 0700 11/30/12 0903  BP: 133/64 113/55 123/67 123/68  Pulse: 72 67 98 98  Temp: 99.1 F (37.3 C) 98.6 F (37 C) 99 F (37.2 C) 99 F (37.2 C)  TempSrc: Oral Oral Oral Oral  Resp: 20 18 18 18   Height:      Weight:  162 lb 7.7 oz (73.7 kg)    SpO2: 97% 97% 99% 99%   Weight change: 11 lb 14.1 oz (5.388 kg)  Intake/Output Summary (Last 24 hours) at 11/30/12 0933 Last data filed at 11/30/12 0900  Gross per 24 hour  Intake    780 ml  Output      0 ml  Net    780 ml   General: alert, well-developed, and cooperative to examination.  Neck: supple, full ROM, no thyromegaly, no JVD, and no carotid bruits.  Lungs: normal respiratory effort, no accessory muscle use, normal breath sounds, no crackles, and no wheezes. Heart: normal rate, regular rhythm, no murmur, no gallop, and no rub.  Abdomen: soft, non-tender, normal bowel sounds, no distention, no guarding, no rebound tenderness, no hepatomegaly, and no splenomegaly.  Pulses: 2+ DP/PT pulses bilaterally Extremities: Bilateral LE chronic edema ( from her CABG surgery). LE less swelling and erythema,  mild  tenderness to touch. But no with warmth to touch. Her erythema is marked and much better today.  B/L feet callous skin noted. Remote left toe amputation noted. No ulcers noted.  Neurologic: alert & oriented X3, cranial nerves II-XII intact, strength normal in all extremities, sensation intact to light touch, and gait normal.  Skin: turgor normal and no rashes.  Psych: Oriented X3, memory intact for recent and remote, normally interactive, good eye contact, not anxious appearing, and not depressed appearing.  Lab Results: Basic Metabolic Panel:  Recent Labs Lab 11/29/12 0620 11/30/12 0555  Arshad Oberholzer 136 136  K 5.0 4.4  CL 101 105   CO2 24 23  GLUCOSE 112* 137*  BUN 38* 37*  CREATININE 1.40* 1.45*  CALCIUM 9.0 8.6   Liver Function Tests:  Recent Labs Lab 11/28/12 1900  AST 42*  ALT 38*  ALKPHOS 167*  BILITOT 0.7  PROT 8.1  ALBUMIN 3.5   CBC:  Recent Labs Lab 11/28/12 1624 11/28/12 1900 11/29/12 0620 11/30/12 0555  WBC 6.5 6.9 6.3 5.5  NEUTROABS 4.0 4.3  --   --   HGB 12.1 12.8 10.8* 11.0*  HCT 35.2* 36.9 32.0* 32.9*  MCV 93.4 93.7 93.8 95.4  PLT 200 219 187 174   CBG:  Recent Labs Lab 11/29/12 0801 11/29/12 1225 11/29/12 1739 11/29/12 2233 11/30/12 0305 11/30/12 0751  GLUCAP 93 246* 205* 120* 111* 108*   Anemia Panel:  Recent Labs Lab 11/26/12 1132  VITAMINB12 612  FERRITIN 240  TIBC 237*  IRON 44   Urinalysis:  Recent Labs Lab 11/28/12 1947  COLORURINE YELLOW  LABSPEC 1.010  PHURINE 5.5  GLUCOSEU NEGATIVE  HGBUR NEGATIVE  BILIRUBINUR NEGATIVE  KETONESUR NEGATIVE  PROTEINUR NEGATIVE  UROBILINOGEN 0.2  NITRITE NEGATIVE  LEUKOCYTESUR NEGATIVE    Micro Results: Recent Results (from the past 240 hour(s))  MRSA PCR SCREENING     Status: Abnormal   Collection Time    11/28/12  9:32 PM      Result Value Range Status   MRSA by  PCR POSITIVE (*) NEGATIVE Final   Comment:            The GeneXpert MRSA Assay (FDA     approved for NASAL specimens     only), is one component of a     comprehensive MRSA colonization     surveillance program. It is not     intended to diagnose MRSA     infection nor to guide or     monitor treatment for     MRSA infections.     RESULT CALLED TO, READ BACK BY AND VERIFIED WITHMathews Robinsons [id number] AT 2334 BY [facility]   Studies/Results: Dg Chest 2 View  11/28/2012  *RADIOLOGY REPORT*  Clinical Data: Fever.  Leg cellulitis.  CHEST - 2 VIEW  Comparison: 11/15/2012  Findings: Prior CABG.  Heart is borderline in size.  Lungs are clear.  No effusions or acute bony abnormality.  IMPRESSION: No acute cardiopulmonary disease.   Original  Report Authenticated By: Charlett Nose, M.D.    Medications: I have reviewed the patient's current medications. Scheduled Meds: . aspirin EC  325 mg Oral Daily  . atorvastatin  40 mg Oral q1800  . brimonidine  1 drop Both Eyes BID  . Chlorhexidine Gluconate Cloth  6 each Topical Q0600  . [START ON 12/19/2012] cyanocobalamin  1,000 mcg Intramuscular Q30 days  . docusate sodium  50 mg Oral BID  . enoxaparin (LOVENOX) injection  40 mg Subcutaneous Q24H  . ezetimibe  10 mg Oral QHS  . insulin aspart  0-9 Units Subcutaneous TID WC  . insulin glargine  10 Units Subcutaneous QHS  . insulin glargine  20 Units Subcutaneous Daily  . iron polysaccharides  150 mg Oral Daily  . isosorbide mononitrate  30 mg Oral Daily  . latanoprost  1 drop Both Eyes QHS  . levothyroxine  125 mcg Oral QAC breakfast  . metoprolol succinate  25 mg Oral Daily  . mupirocin ointment  1 application Nasal BID  . vancomycin  750 mg Intravenous Q24H   Continuous Infusions: . sodium chloride 125 mL/hr at 11/30/12 0713   PRN Meds:. Assessment/Plan:  #. Left LE cellulitis, much improved  recurrent cellulitis in the setting of poorly controlled DM. No evidence of systemic infection. Afebrile and no leukocytosis.  In the past, has failed outpatient therapy with doxycycline and had worsening of CHF with bactrim. Has responded to po clindamycin after recent admission, as well as po linezolid after admission in July.   - incompleted LE Doppler yesterday due to pain. Patient states that she is willing to have a repeat Doppler study today since her Left LE pain and swelling is better.  She declined to take any narcotic pain meds. - Vancomycin per pharmacy  - likely D/C today with Clindamycin if her doppler study is negative for DVT - f/u as an outpatient  - HIV negative  #. DM type 1, Last Hb A1c 10.2 in 06/13  - Will continue home lantus dose and TID humalog with meals.  - add a 3 am blood sugar check --CBG 111 - Hg A1C  pending   3. CHF  Recent EF 30% in January 2013. Stable, no evidence of volume overload on examination. Pt w/o chest pain or SOB. Followed by [facility] ([name]), last appointment was earlier in January.  - Continue metoprolol   4. HTN  Normotensive at 123/70.  - Continue home antihypertensives   5. CKD stage III  Slightly  elevated Cr and GFR overnight, likely prerenal in the setting of decreased oral intake and home Lasix  Caution on Vancomycin which can contribute to AKI.  - continue to hold lasix today  - will resume lasix tomorrow  6. Lactic acid elevation, 2.27 on admission, resolved - mild elevation of lactic acid on admission likely associated with her acute cellulitis. No signs of sepsis.  -blood culture x 2 pending   7. EKG changes  Lateral leads TWI noted on admission, which is chronic.  No S/s ACS.   Dispo: Disposition is deferred at this time, awaiting improvement of current medical problems. Anticipated discharge in approximately 1-2 day(s).  The patient does have a current PCP ([facility], Brien Few, MD), therefore will be requiring OPC follow-up after discharge.  The patient does not have transportation limitations that hinder transportation to clinic appointments.       .Services Needed at time of discharge: Y = Yes, Blank = No PT:   OT:   RN:   Equipment:   Other:     LOS: 2 days   Roe Wilner 11/30/2012, 9:33 AM

## 2012-11-30 NOTE — Discharge Summary (Signed)
Internal Medicine Teaching [facility] Discharge Note  Name: [name] MRN: [id number] DOB: 1945/10/18 67 y.o.  Date of Admission: 11/28/2012  5:18 PM Date of Discharge: 11/30/2012 Attending Physician: Aletta Edouard, MD  Discharge Diagnosis: 1. Left leg cellulitis 2. DM type 1 3. lactic acid elevation, resolved 4.  CKD stage III 5. History of CHF 6. Hypertension    Discharge Medications:   Medication List    TAKE these medications       aspirin EC 325 MG tablet  Take 325 mg by mouth daily.     atorvastatin 40 MG tablet  Commonly known as:  LIPITOR  Take 40 mg by mouth at bedtime.     brimonidine 0.2 % ophthalmic solution  Commonly known as:  ALPHAGAN  Place 1 drop into both eyes 2 (two) times daily.     clindamycin 150 MG capsule  Commonly known as:  CLEOCIN  Take 1 capsule (150 mg total) by mouth 4 (four) times daily.     clindamycin 300 MG capsule  Commonly known as:  CLEOCIN  Take 1 capsule (300 mg total) by mouth 4 (four) times daily.     cyanocobalamin 1000 MCG/ML injection  Commonly known as:  (VITAMIN B-12)  Inject 1,000 mcg into the muscle every 30 (thirty) days.     docusate sodium 50 MG capsule  Commonly known as:  COLACE  Take by mouth 2 (two) times daily.     ezetimibe 10 MG tablet  Commonly known as:  ZETIA  Take 10 mg by mouth at bedtime.     furosemide 40 MG tablet  Commonly known as:  LASIX  Take 40 mg by mouth daily.     furosemide 20 MG tablet  Commonly known as:  LASIX  Take 20 mg by mouth 2 (two) times a week. Takes 1 tablet on tuesdays and saturdays in addition with the 40mg      HUMALOG PEN 100 UNIT/ML injection  Generic drug:  insulin lispro  Inject 0-10 Units into the skin 4 (four) times daily. Per sliding scale DO NOT GIVE WITHOUT CONSULTING PATIENT ON HER SLIDING SCALE     insulin glargine 100 UNIT/ML injection  Commonly known as:  LANTUS  Take 20 units subcutaneously each morning and 10 units each evening.      isosorbide mononitrate 30 MG 24 hr tablet  Commonly known as:  IMDUR  Take 30 mg by mouth daily.     latanoprost 0.005 % ophthalmic solution  Commonly known as:  XALATAN  Place 1 drop into both eyes at bedtime.     levothyroxine 125 MCG tablet  Commonly known as:  SYNTHROID, LEVOTHROID  Take 125 mcg by mouth daily.     metoprolol succinate 25 MG 24 hr tablet  Commonly known as:  TOPROL-XL  Take 25 mg by mouth daily.     SLOW RELEASE IRON 50 MG Tbcr  Generic drug:  Ferrous Sulfate  Take 50 mg by mouth daily.        Disposition and follow-up:   [name] was discharged from [facility] in Stable condition.  At the hospital follow up visit please address  1. please exam her left leg to ensure the complete resolution of her cellulitis.  She is discharged with a 7-day supply of clindamycin 450 mg by mouth every 6 hours to complete a total of 10 day course of antibiotic treatment.  May consider to extend the duration of antibiotic therapy based on severity  of her cellulitis per exam at the clinical visit. 2. Please check her BMP and CBC   Follow-up Appointments: Follow-up Information   Follow up with [name], MD In 7 days. (please call the clinic for appt on Friday 2/21 or Monday 2/24)    Contact information:   [address] [place] [address] [phone]       Future Appointments Provider Department Dept Phone   12/16/2012 1:30 PM [facility] [phone]   12/16/2012 2:00 PM [facility] [phone]   12/16/2012 2:40 PM Evern Bio, NP [facility] [phone]   12/23/2012 4:00 PM [facility] [place] [facility] [facility] [phone]   01/20/2013 4:00 PM [facility] Nurse [place] [facility] [phone]   02/17/2013 4:00 PM [facility] Nurse [place] [facility] [phone]   03/17/2013 4:00 PM [facility] [place] [facility] [phone]   04/14/2013 10:30 AM [facility] Nurse [place] [facility] [phone]   05/12/2013 10:30 AM [facility] Nurse [place] [facility] [phone]   06/05/2013 10:30 AM [facility] Only [place] [facility] [phone]   06/08/2013 11:00 AM Victorino December, MD [facility] [phone]   06/08/2013 11:45 AM [facility] In[facility] Nurse [place] [facility] [phone]   07/06/2013 10:30 AM [facility] In[facility] Nurse [place] [facility] [phone]   08/03/2013 10:30 AM [facility] In[facility] Nurse [place] [facility] [phone]   08/31/2013 10:30 AM [facility] In[facility] Nurse [place] [facility] [phone]   09/28/2013 10:30 AM [facility] In[facility] Nurse  [facility] [phone]      Consultations:  none  Procedures Performed:  Dg Chest 2 View  11/28/2012  *RADIOLOGY REPORT*  Clinical Data: Fever.  Leg cellulitis.  CHEST - 2 VIEW  Comparison: 11/15/2012  Findings: Prior CABG.  Heart is borderline in size.  Lungs are clear.  No effusions or acute bony abnormality.  IMPRESSION: No acute cardiopulmonary disease.   Original Report Authenticated By: Charlett Nose, M.D.    Dg Ribs Unilateral W/chest Right  11/15/2012  *RADIOLOGY REPORT*  Clinical Data: Rib pain.  Status post fall.  RIGHT RIBS AND CHEST - 3+ VIEW  Comparison: Two-view chest 09/04/2012.  Findings: The right pleural effusion has cleared.  The heart to is within normal limits for size.  Dedicated imaging of the ribs demonstrates no acute or healing fracture.  IMPRESSION:  1.  No acute abnormality. 2.  Interval resolution of the right pleural effusion and airspace disease.   Original Report Authenticated By:  Marin Roberts, M.D.    Admission HPI:  [name] is a 67 year old female with multiple medical problems including CHF, brittle type 1 diabetes, peripheral vascular disease s/p LE bypass surgery, coronary artery disease status post CABG x4, and history of recurrent right lower extremity cellulitis presenting with persistent complaints of left lower extremity redness, swelling, erythema.  She says that she noticed pain, redness, and swelling of her left lower leg from above the ankle to below the knee starting on Monday. She says that the redness and pain were at their worst Wednesday or Thursday, but seem to be improving today. Five days ago at first presentation, she measured temperature at home which was 100.6. She has not had  any other measured fevers at home since then. She does not have any joint pain or swelling of the left ankle or knee.  She has had admissions for lower extremity cellulitis in the past, in July (right leg) and November (left leg) of this year.  She denies fever, chills, nausea, vomiting, diarrhea, dizziness, lightheadedness, headache, chest pain, shortness of breath, palpitations, swelling of right lower extremity, numbness, tingling, joint swelling, dysuria, change in bowel movements, hematochezia, melena.   Hospital Course by problem list: 1. Left leg cellulitis    Patient presents with left leg cellulitis in the setting of poorly controlled diabetes.  No skin breaking down or ulcers noted on her lower extremities.  She was treated with IV vancomycin and will be discharged home with a 7 day supply of clindamycin 450 mg po every 6 hours to complete a 10-day course of antibiotic treatment.  Patient is stable upon discharge and will followup with her PCP at the outpatient clinic in one week.  I have sent an epic note to the clinic front desk. Of note, B/L LE doppler ruled out DVT.  2. DM type 1 Hemoglobin A1c 9.3 on this admission, which is actually better than her A1c from  last year.  She should continue to followup with her PCP for further management of her diabetes as an outpatient.   3. lactic acid elevation, resolved Lactic acid was 2.27 on admission which could relate to her acute cellulitis.  No signs of systemic infection or sepsis.  Lactic acid was 0.8 upon discharge.  4. CKD stage III   Her creatinine was 1.26 on admission and elevated to 1.40-1.45 during this admission.  The likely etiology of her elevated Cr was prerenal associated with dehydration and lasix.  She was given gentle fluid replacement and her home dose of Lasix was held for one day.  Her Lasix was resumed on the day of her discharge.  She should continue followup with her PCP as an outpatient. She should have BMP rechecked during HFU visit.   5. History of CHF, stable. Denies shortness of breath, chest pain or chest pressure.  No JVD.  B/L CTA on exam.  No acute cardiopulmonary disease on chest x-ray.  She should continue home lasix regimen.  6. Hypertension, stable    Discharge Vitals:  BP 123/68  Pulse 98  Temp(Src) 99 F (37.2 C) (Oral)  Resp 18  Ht 5\' 4"  (1.626 m)  Wt 162 lb 7.7 oz (73.7 kg)  BMI 27.88 kg/m2  SpO2 99%  Discharge Labs:  Results for orders placed during the hospital encounter of 11/28/12 (from the past 24 hour(s))  HEMOGLOBIN A1C     Status: Abnormal   Collection Time    11/29/12  2:47 PM      Result Value Range   Hemoglobin A1C 9.3 (*) <5.7 %   Mean Plasma Glucose 220 (*) <117 mg/dL  GLUCOSE, CAPILLARY     Status: Abnormal   Collection Time    11/29/12  5:39 PM      Result Value Range   Glucose-Capillary 205 (*) 70 - 99 mg/dL   Comment 1 Notify RN    GLUCOSE, CAPILLARY     Status: Abnormal   Collection Time    11/29/12 10:33 PM      Result Value Range   Glucose-Capillary 120 (*) 70 - 99 mg/dL  GLUCOSE, CAPILLARY     Status: Abnormal   Collection Time    11/30/12  3:05 AM  Result Value Range   Glucose-Capillary 111 (*) 70 - 99 mg/dL  BASIC  METABOLIC PANEL     Status: Abnormal   Collection Time    11/30/12  5:55 AM      Result Value Range   Sodium 136  135 - 145 mEq/L   Potassium 4.4  3.5 - 5.1 mEq/L   Chloride 105  96 - 112 mEq/L   CO2 23  19 - 32 mEq/L   Glucose, Bld 137 (*) 70 - 99 mg/dL   BUN 37 (*) 6 - 23 mg/dL   Creatinine, Ser 4.09 (*) 0.50 - 1.10 mg/dL   Calcium 8.6  8.4 - 81.1 mg/dL   GFR calc non Af Amer 36 (*) >90 mL/min   GFR calc Af Amer 42 (*) >90 mL/min  CBC     Status: Abnormal   Collection Time    11/30/12  5:55 AM      Result Value Range   WBC 5.5  4.0 - 10.5 K/uL   RBC 3.45 (*) 3.87 - 5.11 MIL/uL   Hemoglobin 11.0 (*) 12.0 - 15.0 g/dL   HCT 91.4 (*) 78.2 - 95.6 %   MCV 95.4  78.0 - 100.0 fL   MCH 31.9  26.0 - 34.0 pg   MCHC 33.4  30.0 - 36.0 g/dL   RDW 21.3  08.6 - 57.8 %   Platelets 174  150 - 400 K/uL  LACTIC ACID, PLASMA     Status: None   Collection Time    11/30/12  5:55 AM      Result Value Range   Lactic Acid, Venous 0.8  0.5 - 2.2 mmol/L  GLUCOSE, CAPILLARY     Status: Abnormal   Collection Time    11/30/12  7:51 AM      Result Value Range   Glucose-Capillary 108 (*) 70 - 99 mg/dL  GLUCOSE, CAPILLARY     Status: Abnormal   Collection Time    11/30/12 12:10 PM      Result Value Range   Glucose-Capillary 185 (*) 70 - 99 mg/dL    Signed: Rhiley Tarver 4/69/6295, 2:09 PM   Time Spent on Discharge: > 35 minutes Services Ordered on Discharge: none Equipment Ordered on Discharge: none

## 2012-12-02 NOTE — Discharge Summary (Signed)
Internal Medicine Teaching Service Attending Note Date: 12/02/2012  Patient name: [name]  Medical record number: [id number]  Date of birth: 1946-03-20    I evaluated the patient on the day of discharge and discussed the discharge plan with my resident team. I agree with the discharge documentation and disposition. Please see Dr. Stevenson Clinch note for complete detail of the discharge summary and the recommendations we arrived at after discussion within the team. I thank the team for working with me on this patient's care.   Thanks Aletta Edouard 12/02/2012, 8:16 AM

## 2012-12-05 ENCOUNTER — Ambulatory Visit (INDEPENDENT_AMBULATORY_CARE_PROVIDER_SITE_OTHER): Payer: Medicare Other | Admitting: Internal Medicine

## 2012-12-05 ENCOUNTER — Encounter: Payer: Self-pay | Admitting: Internal Medicine

## 2012-12-05 VITALS — BP 123/57 | HR 57 | Temp 97.0°F | Wt 164.1 lb

## 2012-12-05 DIAGNOSIS — I10 Essential (primary) hypertension: Secondary | ICD-10-CM

## 2012-12-05 DIAGNOSIS — L03116 Cellulitis of left lower limb: Secondary | ICD-10-CM

## 2012-12-05 DIAGNOSIS — E109 Type 1 diabetes mellitus without complications: Secondary | ICD-10-CM

## 2012-12-05 DIAGNOSIS — Z0000 Encounter for general adult medical examination without abnormal findings: Secondary | ICD-10-CM | POA: Diagnosis not present

## 2012-12-05 DIAGNOSIS — R0602 Shortness of breath: Secondary | ICD-10-CM

## 2012-12-05 DIAGNOSIS — N179 Acute kidney failure, unspecified: Secondary | ICD-10-CM | POA: Diagnosis not present

## 2012-12-05 DIAGNOSIS — L02419 Cutaneous abscess of limb, unspecified: Secondary | ICD-10-CM | POA: Diagnosis not present

## 2012-12-05 LAB — CULTURE, BLOOD (ROUTINE X 2): Culture: NO GROWTH

## 2012-12-05 LAB — CBC WITH DIFFERENTIAL/PLATELET
Basophils Absolute: 0 10*3/uL (ref 0.0–0.1)
Eosinophils Absolute: 0.4 10*3/uL (ref 0.0–0.7)
Eosinophils Relative: 8 % — ABNORMAL HIGH (ref 0–5)
HCT: 32.1 % — ABNORMAL LOW (ref 36.0–46.0)
Lymphocytes Relative: 17 % (ref 12–46)
Lymphs Abs: 0.9 10*3/uL (ref 0.7–4.0)
MCH: 31.7 pg (ref 26.0–34.0)
MCV: 92.5 fL (ref 78.0–100.0)
Monocytes Absolute: 0.5 10*3/uL (ref 0.1–1.0)
Monocytes Relative: 9 % (ref 3–12)
Platelets: 216 10*3/uL (ref 150–400)
RBC: 3.47 MIL/uL — ABNORMAL LOW (ref 3.87–5.11)
WBC: 5.2 10*3/uL (ref 4.0–10.5)

## 2012-12-05 NOTE — Patient Instructions (Signed)
General Instructions: -Continue taking Lasix to get rid of the extra fluid; this will help with your shortness of breath -Call us on Monday if your leg swelling has not improved. We may need to resend a prescription for more antibiotic (clindamycin) -You need to follow up with [place] for your Pneumonia vaccine and that urine test for your kidney function.  -It was great seeing you today!   Treatment Goals:  Goals (1 Years of Data) as of 12/05/12         As of Today 11/30/12 11/30/12 11/30/12 11/29/12     Blood Pressure    . Blood Pressure < 140/90  123/57 123/68 123/67 113/55 133/64     Result Component    . HEMOGLOBIN A1C < 7.0      9.3    . LDL CALC < 100            Progress Toward Treatment Goals:  Treatment Goal 12/05/2012  Hemoglobin A1C improved  Blood pressure at goal    Self Care Goals & Plans:  Self Care Goal 12/05/2012  Eat healthy foods eat more vegetables; eat fruit for snacks and desserts; eat baked foods instead of fried foods    Home Blood Glucose Monitoring 12/05/2012  Check my blood sugar 2 times a day

## 2012-12-06 LAB — BASIC METABOLIC PANEL WITH GFR
CO2: 26 mEq/L (ref 19–32)
Calcium: 9 mg/dL (ref 8.4–10.5)
Chloride: 102 mEq/L (ref 96–112)
Creat: 1.31 mg/dL — ABNORMAL HIGH (ref 0.50–1.10)
GFR, Est Non African American: 42 mL/min — ABNORMAL LOW
Glucose, Bld: 219 mg/dL — ABNORMAL HIGH (ref 70–99)
Sodium: 135 mEq/L (ref 135–145)

## 2012-12-06 MED ORDER — FUROSEMIDE 20 MG PO TABS: 20.0000 mg | ORAL_TABLET | ORAL | Status: DC

## 2012-12-06 NOTE — Assessment & Plan Note (Signed)
She reports having the flu vaccine 3 weeks ago at [facility].  She is followed at [facility] and does not want to have her records sent here due to the high complexity of her ophthalmological treatments.

## 2012-12-06 NOTE — Assessment & Plan Note (Addendum)
Improving. She will finish her course of clindamycin on Monday, 2/24. She will call us if the swelling and redness in her leg is not improving. If it is not improving, we will extend her antibiotic tx for a 10-14 days course. She was advised to call us or go to the ED if she develops fever/chills, worse swelling and redness of her leg, N/V, or diarrhea.  She agreed and understood.  CBC with no leukocytosis

## 2012-12-06 NOTE — Progress Notes (Signed)
  Subjective:    Patient ID: [name], female    DOB: Jan 04, 1946, 67 y.o.   MRN: [id number]  HPI [name] is a 67 year old woman with PMH of CHF with EF of 30% on 11/14/11, HTN, DM1, and CKD stage 3 who comes in for HFU visit for cellulitis of her left leg. While in the hospital she had Doppler US that was negative for DVT and was treated with Vancomycin and Zosyn. She was discharged on 2/16 with prescription for clindamycin for 7 days. She reports that she will finish her course of antibiotics on Monday, 2/24. She reports that her leg swelling, pain, and redness are much improved, close to her baseline. She denies fever, chills, decreased appetite, N/V, or diarrhea.  She complains of shortness of breath and weight gain which she attributes to IV fluids received while in the hospital. She states that since her discharge from the hospital she has unable to fit in her regular clothes except her pajamas due to a weight gain of 9 pounds.  Immediately after her discharge she was unable to prepare meals, or take care of the house 2/2 to severe shortness of breath and had to rely on friends and family to help her. Her shortness of breath is improving.    Review of Systems  Constitutional: Negative for fever, chills, diaphoresis, activity change, appetite change, fatigue and unexpected weight change.  Respiratory: Positive for shortness of breath. Negative for cough, chest tightness, wheezing and stridor.   Cardiovascular: Positive for leg swelling. Negative for chest pain and palpitations.  Gastrointestinal: Positive for abdominal distention. Negative for nausea, vomiting, diarrhea and constipation.  Genitourinary: Negative for urgency, frequency, decreased urine volume and difficulty urinating.  Skin: Positive for color change and rash. Negative for pallor and wound.       Chronic redness of her LE b/l, Left leg with redness that is improving. No redness anywhere else in her body   Neurological: Negative for dizziness, light-headedness and headaches.  Hematological: Negative for adenopathy.  Psychiatric/Behavioral: Negative for behavioral problems and agitation.       Objective:   Physical Exam  Nursing note and vitals reviewed. Constitutional: She is oriented to person, place, and time. She appears well-developed and well-nourished. No distress.  Eyes: Conjunctivae are normal. Right eye exhibits no discharge. Left eye exhibits no discharge. No scleral icterus.  Neck: No JVD present.  Cardiovascular: Normal rate and regular rhythm.   Pulmonary/Chest: Effort normal and breath sounds normal. No respiratory distress. She has no wheezes. She has no rales. She exhibits no tenderness.  No basilar crackles noted  Abdominal: Soft. She exhibits no distension. There is no tenderness.  Musculoskeletal: She exhibits edema and tenderness.  1+pitting edema bilaterally. Left leg TTP that is reported as much improved by the patient.   Neurological: She is alert and oriented to person, place, and time.  Skin: Skin is warm and dry. Rash noted. She is not diaphoretic. There is erythema. No pallor.  Bilateral LE edema, with Left leg more erythematous and calf slightly wider than Right calf--reported to be baseline by the patient. No calf tenderness. Well healed vascular surgery scars bilaterally.  All left toes amputated, left foot with mild erythema but not as TTP.  Psychiatric: She has a normal mood and affect. Her behavior is normal.          Assessment & Plan:

## 2012-12-06 NOTE — Assessment & Plan Note (Addendum)
Resolving. Baseline Cr ~1.2, creatinine during this visit at 1.31.

## 2012-12-06 NOTE — Assessment & Plan Note (Signed)
She is due for urine microalbumin but cannot provide a sample today, she will defer this to her next visit.

## 2012-12-06 NOTE — Assessment & Plan Note (Addendum)
Likely 2/2 recent IVF treatment for AKI. Pt with LE edema today but no indication of pulmonary edema based on physical exam. O2 saturation of 98% on RA, regular RR. Pt reports that her shortness of breath was severe immediately after her discharge but has improved.  -BMET today for AKI eval, creatinine improved. -Continue current Lasix dose for diuresis -She will follow up with her Cardiologist if her SOB does not improve

## 2012-12-06 NOTE — Assessment & Plan Note (Signed)
BP well controlled today, no medication changes.  

## 2012-12-15 ENCOUNTER — Encounter: Payer: Self-pay | Admitting: Neurosurgery

## 2012-12-16 ENCOUNTER — Encounter: Payer: Self-pay | Admitting: Neurosurgery

## 2012-12-16 ENCOUNTER — Encounter (INDEPENDENT_AMBULATORY_CARE_PROVIDER_SITE_OTHER): Payer: Medicare Other | Admitting: *Deleted

## 2012-12-16 ENCOUNTER — Ambulatory Visit (INDEPENDENT_AMBULATORY_CARE_PROVIDER_SITE_OTHER): Payer: Medicare Other | Admitting: Neurosurgery

## 2012-12-16 DIAGNOSIS — Z48812 Encounter for surgical aftercare following surgery on the circulatory system: Secondary | ICD-10-CM

## 2012-12-16 DIAGNOSIS — I739 Peripheral vascular disease, unspecified: Secondary | ICD-10-CM

## 2012-12-16 NOTE — Progress Notes (Signed)
[facility] PAD/PVD Office Note  CC: PAD surveillance Referring Physician: Early  History of Present Illness: 67 year old female patient of [name] status post left femoral to anterior tibial artery bypass in July 2010 and a left forefoot amputation in 2011. The patient denies claudication or rest pain. She was recently hospitalized with cellulitis of the left lower extremity which is chronic for her. The patient reports no other acute medical problems.  Past Medical History  Diagnosis Date  . HTN (hypertension)   . Hypothyroidism   . History of recurrent UTIs   . CAD (coronary artery disease)     s/p CABG x3 in 1993; last cath in 2010  . Scleroderma   . Bilateral carpal tunnel syndrome 1970s  . 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 Followed by [name]  . Claudication   . Aortic stenosis     0.8cm2 on echo in 10/2011  . Hyperlipidemia     on Lipitor  . Cat bite 2009    with MRSA; required I & D  . Heart murmur   . Blood transfusion     no reaction from transfusion; "when I had heart surgery" (08/28/2012)  . GERD (gastroesophageal reflux disease)   . CHF (congestive heart failure) Aug. 2012    Echo 10/2011: Mild LVH, EF 30%, apex akinetic, septal HK, grade 2 diastolic dysfunction, or functionally bicuspid aortic valve, mild aortic stenosis by gradient, severe by calculated AVA-suspect A. Aortic stenosis probably moderate, trivial MR, mild LAE, mild RAE.   [name] Cellulitis   . Anemia   . B12 deficiency anemia     B 12 injection "monthly since 05/2012" (08/28/2012)  . Iron deficiency anemia 06/13/2012    "iron infusion" (08/28/2012  . Anginal pain     "history; not currently" (08/28/2012)  . Myocardial infarction 1986    "silent"  . History of bronchitis     "I've had it twice in my life" (08/28/2012)  . Exertional dyspnea     "because of the heart failure" (08/28/2012)  . Type 1  diabetes 10/1949  . Stomach ulcer 1981?    "on Tagament for ~ 1 yr" (08/28/2012)  . Seizure     ? seizure like event in 2010; workup included stress testing which led to repeat cath.   . Arthritis     "hands, hips, all over" (08/28/2012  . Osteoarthritis of both feet   . Breast cancer     left  . Cellulitis June 2013, Nov. 2013 and Feb. 14, 2014    Left leg    ROS: [x]  Positive   [ ]  Denies    General: [ ]  Weight loss, [ ]  Fever, [ ]  chills Neurologic: [ ]  Dizziness, [ ]  Blackouts, [ ]  Seizure [ ]  Stroke, [ ]  "Mini stroke", [ ]  Slurred speech, [ ]  Temporary blindness; [ ]  weakness in arms or legs, [ ]  Hoarseness Cardiac: [ ]  Chest pain/pressure, [ ]  Shortness of breath at rest [ ]  Shortness of breath with exertion, [ ]  Atrial fibrillation or irregular heartbeat Vascular: [ ]  Pain in legs with walking, [ ]  Pain in legs at rest, [ ]  Pain in legs at night,  [ ]  Non-healing ulcer, [ ]  Blood clot in vein/DVT,   Pulmonary: [ ]  Home oxygen, [ ]  Productive cough, [ ]  Coughing up blood, [ ]  Asthma,  [ ]  Wheezing Musculoskeletal:  [ ]   Arthritis, [ ]  Low back pain, [ ]  Joint pain Hematologic: [ ]  Easy Bruising, [ ]  Anemia; [ ]  Hepatitis Gastrointestinal: [ ]  Blood in stool, [ ]  Gastroesophageal Reflux/heartburn, [ ]  Trouble swallowing Urinary: [ ]  chronic Kidney disease, [ ]  on HD - [ ]  MWF or [ ]  TTHS, [ ]  Burning with urination, [ ]  Difficulty urinating Skin: [ ]  Rashes, [ ]  Wounds Psychological: [ ]  Anxiety, [ ]  Depression   Social History History  Substance Use Topics  . Smoking status: Never Smoker   . Smokeless tobacco: Never Used  . Alcohol Use: No    Family History Family History  Problem Relation Age of Onset  . Diabetes Mother     Allergies  Allergen Reactions  . Adhesive (Tape) Other (See Comments)    "old Johnson/Johnson adhesive tape; takes my skin off; can use paper tape"  . Bactrim (Sulfamethoxazole W-Trimethoprim)     Hyperkalemia and Increased creatinine  .  Cephalexin Swelling and Rash  . Ciprofloxacin Swelling and Rash    Current Outpatient Prescriptions  Medication Sig Dispense Refill  . acetaminophen (TYLENOL) 100 MG/ML solution Take 10 mg/kg by mouth every 4 (four) hours as needed for fever.      [name] aspirin EC 325 MG EC tablet Take 325 mg by mouth daily.        [name] atorvastatin (LIPITOR) 40 MG tablet Take 40 mg by mouth at bedtime.      . brimonidine (ALPHAGAN) 0.2 % ophthalmic solution Place 1 drop into both eyes 2 (two) times daily.        . cyanocobalamin (,VITAMIN B-12,) 1000 MCG/ML injection Inject 1,000 mcg into the muscle every 30 (thirty) days.      [name] docusate sodium (COLACE) 50 MG capsule Take by mouth 2 (two) times daily.      [name] ezetimibe (ZETIA) 10 MG tablet Take 10 mg by mouth at bedtime.      . Ferrous Sulfate (SLOW RELEASE IRON) 50 MG TBCR Take 50 mg by mouth daily.       . furosemide (LASIX) 20 MG tablet Take 1 tablet (20 mg total) by mouth 2 (two) times a week. Takes 1 tablet on tuesdays and saturdays in addition with the 40mg   30 tablet    . furosemide (LASIX) 40 MG tablet Take 40 mg by mouth daily.      [name] ibuprofen (ADVIL,MOTRIN) 100 MG chewable tablet Chew 100 mg by mouth every 8 (eight) hours as needed for fever.      . insulin glargine (LANTUS) 100 UNIT/ML injection Take 20 units subcutaneously each morning and 10 units each evening.      . insulin lispro (HUMALOG PEN) 100 UNIT/ML injection Inject 0-10 Units into the skin 4 (four) times daily. Per sliding scale DO NOT GIVE WITHOUT CONSULTING PATIENT ON HER SLIDING SCALE      . isosorbide mononitrate (IMDUR) 30 MG 24 hr tablet Take 30 mg by mouth daily.      [name] latanoprost (XALATAN) 0.005 % ophthalmic solution Place 1 drop into both eyes at bedtime.        [name] levothyroxine (SYNTHROID, LEVOTHROID) 125 MCG tablet Take 125 mcg by mouth daily.       . metoprolol succinate (TOPROL-XL) 25 MG 24 hr tablet Take 25 mg by mouth daily.      . clindamycin (CLEOCIN) 150 MG capsule Take 1  capsule (150 mg total) by mouth 4 (four) times daily.  28 capsule  0  . clindamycin (  CLEOCIN) 300 MG capsule Take 1 capsule (300 mg total) by mouth 4 (four) times daily.  28 capsule  0   No current facility-administered medications for this visit.    Physical Examination  There were no vitals filed for this visit.  There is no weight on file to calculate BMI.  General:  WDWN in NAD Gait: Normal HEENT: WNL Eyes: Pupils equal Pulmonary: normal non-labored breathing , without Rales, rhonchi,  wheezing Cardiac: RRR, without  Murmurs, rubs or gallops; No carotid bruits Abdomen: soft, NT, no masses Skin: no rashes, ulcers noted Vascular Exam/Pulses: Palpable femoral pulses bilaterally, lower extremity pulses are not palpated due to tenderness  Extremities without ischemic changes, no Gangrene , no cellulitis; no open wounds;  Musculoskeletal: no muscle wasting or atrophy  Neurologic: A&O X 3; Appropriate Affect ; SENSATION: normal; MOTOR FUNCTION:  moving all extremities equally. Speech is fluent/normal  Non-Invasive Vascular Imaging: ABIs today are improved from previous exam. She is 0.82 on the right, 1.26 biphasic on the left with a patent left femoral to ATA bypass  ASSESSMENT/PLAN: Asymptomatic patient will followup in one year with repeat ABIs and bypass graft duplex. The patient's questions were encouraged and answered, she is in agreement with this plan.  Lauree Chandler ANP  Clinic M.D.: Early

## 2012-12-17 NOTE — Addendum Note (Signed)
Addended by: Sharee Pimple on: 12/17/2012 11:23 AM   Modules accepted: Orders

## 2012-12-21 ENCOUNTER — Other Ambulatory Visit: Payer: Self-pay | Admitting: Physician Assistant

## 2012-12-23 ENCOUNTER — Ambulatory Visit (HOSPITAL_BASED_OUTPATIENT_CLINIC_OR_DEPARTMENT_OTHER): Payer: Medicare Other

## 2012-12-23 VITALS — BP 105/40 | HR 59 | Temp 98.5°F

## 2012-12-23 DIAGNOSIS — E538 Deficiency of other specified B group vitamins: Secondary | ICD-10-CM

## 2012-12-23 DIAGNOSIS — D518 Other vitamin B12 deficiency anemias: Secondary | ICD-10-CM

## 2012-12-23 MED ORDER — CYANOCOBALAMIN 1000 MCG/ML IJ SOLN
1000.0000 ug | Freq: Once | INTRAMUSCULAR | Status: AC
  Administered 2012-12-23: 1000 ug via INTRAMUSCULAR

## 2012-12-24 ENCOUNTER — Telehealth: Payer: Self-pay

## 2012-12-24 ENCOUNTER — Other Ambulatory Visit: Payer: Self-pay | Admitting: *Deleted

## 2012-12-24 NOTE — Telephone Encounter (Signed)
Pt called concerned about her Furosemide refill that was sent into [facility]. Refill sent in matches what she was told in her last office visit. She would like a call back from [name] nurse. Routed message to nurse.

## 2012-12-24 NOTE — Telephone Encounter (Signed)
Reviewed Lasix instructions with pt.

## 2013-01-09 IMAGING — CR DG CHEST 2V
2 series · 2 of 2 positions shown · non-contrast
Comparison: 08/22/2011

CLINICAL DATA: Shortness of breath, cough, history hypertension,
diabetes, coronary disease post CABG and MI

CHEST - 2 VIEW

[w chest pa]
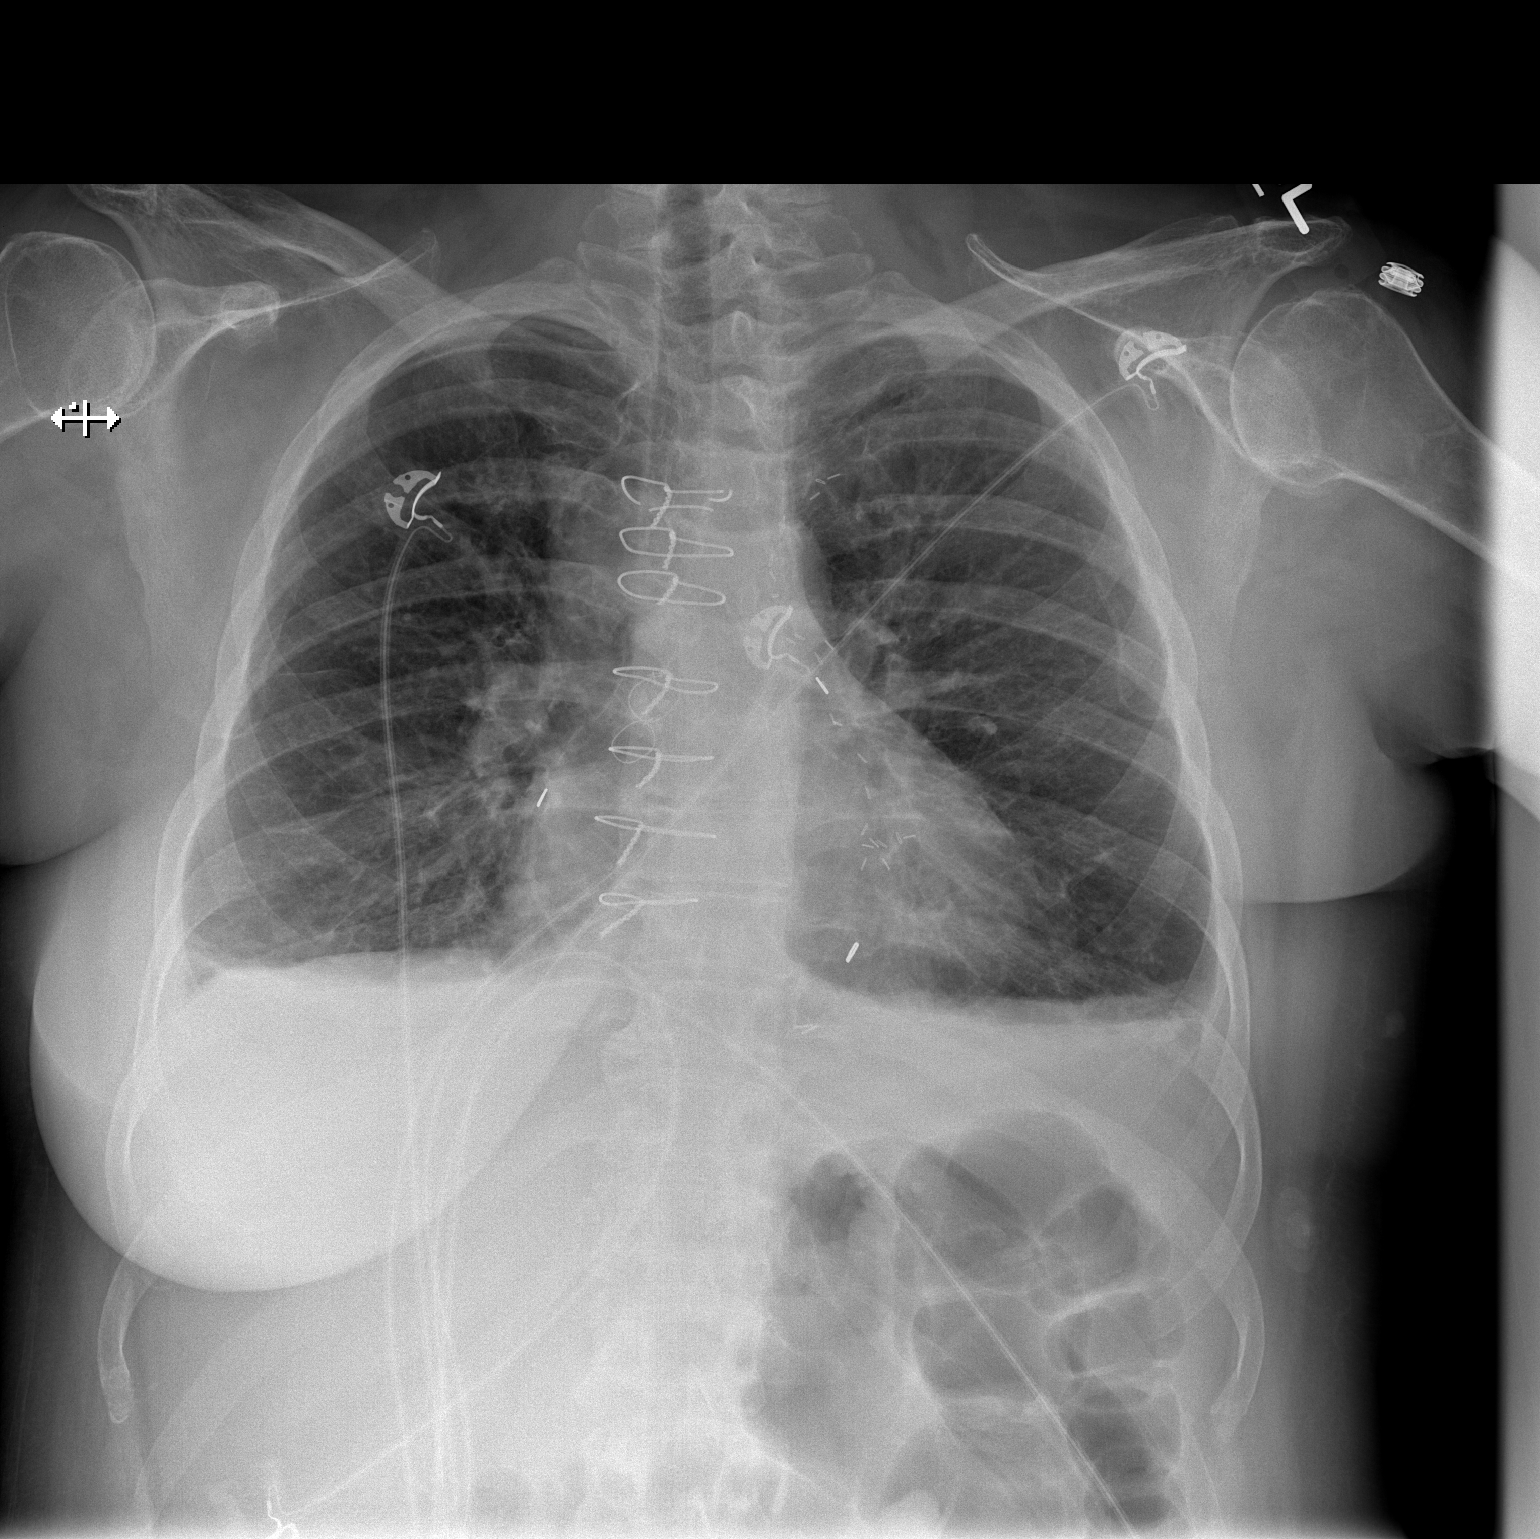

[w chest lat]
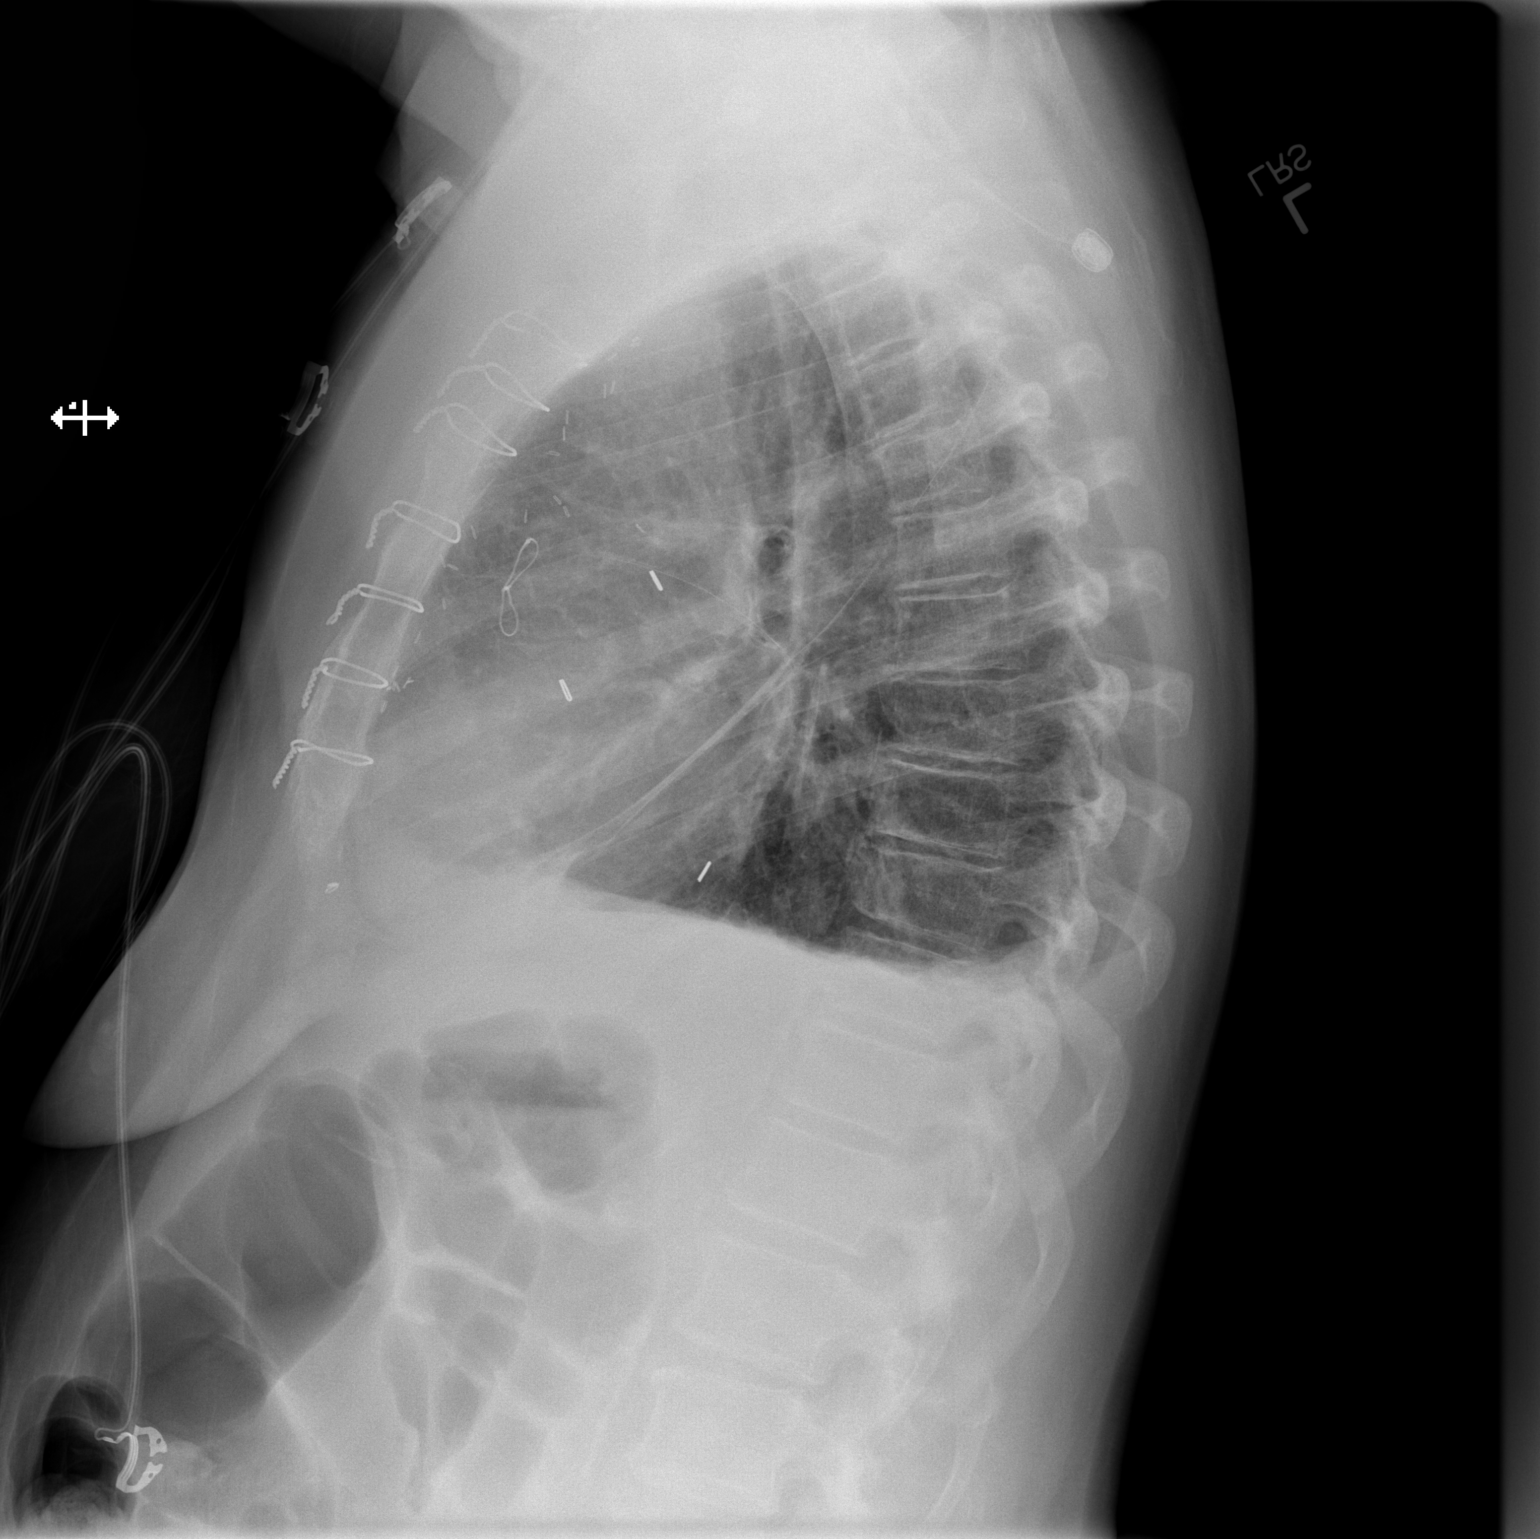

[2 of 2 positions shown; findings below may reference images not displayed]

FINDINGS: Upper normal heart size post CABG.
Mediastinal contours and pulmonary vascularity normal.
Bibasilar effusions and minimal atelectasis.
Chronic peribronchial thickening.
No acute pulmonary edema or segmental consolidation.
No pneumothorax.
Bones diffusely demineralized.
IMPRESSION: Post CABG.
Chronic bronchitic changes with bibasilar small pleural effusions
atelectasis.

## 2013-01-20 ENCOUNTER — Ambulatory Visit (HOSPITAL_BASED_OUTPATIENT_CLINIC_OR_DEPARTMENT_OTHER): Payer: Medicare Other

## 2013-01-20 VITALS — BP 114/42 | HR 65 | Temp 98.1°F

## 2013-01-20 DIAGNOSIS — E538 Deficiency of other specified B group vitamins: Secondary | ICD-10-CM

## 2013-01-20 DIAGNOSIS — D518 Other vitamin B12 deficiency anemias: Secondary | ICD-10-CM

## 2013-01-20 MED ORDER — CYANOCOBALAMIN 1000 MCG/ML IJ SOLN
1000.0000 ug | Freq: Once | INTRAMUSCULAR | Status: AC
  Administered 2013-01-20: 1000 ug via INTRAMUSCULAR

## 2013-01-20 NOTE — Patient Instructions (Addendum)

## 2013-01-21 ENCOUNTER — Encounter: Payer: Self-pay | Admitting: Internal Medicine

## 2013-01-21 DIAGNOSIS — R269 Unspecified abnormalities of gait and mobility: Secondary | ICD-10-CM | POA: Insufficient documentation

## 2013-02-17 ENCOUNTER — Ambulatory Visit: Payer: Medicare Other

## 2013-02-23 ENCOUNTER — Telehealth: Payer: Self-pay | Admitting: Oncology

## 2013-02-24 ENCOUNTER — Ambulatory Visit (HOSPITAL_BASED_OUTPATIENT_CLINIC_OR_DEPARTMENT_OTHER): Payer: Medicare Other

## 2013-02-24 VITALS — BP 123/40 | HR 59 | Temp 98.1°F | Resp 20

## 2013-02-24 DIAGNOSIS — D518 Other vitamin B12 deficiency anemias: Secondary | ICD-10-CM

## 2013-02-24 DIAGNOSIS — E538 Deficiency of other specified B group vitamins: Secondary | ICD-10-CM

## 2013-02-24 MED ORDER — CYANOCOBALAMIN 1000 MCG/ML IJ SOLN
1000.0000 ug | Freq: Once | INTRAMUSCULAR | Status: AC
  Administered 2013-02-24: 1000 ug via INTRAMUSCULAR

## 2013-02-26 ENCOUNTER — Other Ambulatory Visit: Payer: Self-pay | Admitting: Internal Medicine

## 2013-03-17 ENCOUNTER — Ambulatory Visit (HOSPITAL_BASED_OUTPATIENT_CLINIC_OR_DEPARTMENT_OTHER): Payer: Medicare Other

## 2013-03-17 VITALS — BP 124/43 | HR 72 | Temp 98.4°F

## 2013-03-17 DIAGNOSIS — E538 Deficiency of other specified B group vitamins: Secondary | ICD-10-CM | POA: Diagnosis not present

## 2013-03-17 DIAGNOSIS — D518 Other vitamin B12 deficiency anemias: Secondary | ICD-10-CM

## 2013-03-17 MED ORDER — CYANOCOBALAMIN 1000 MCG/ML IJ SOLN
1000.0000 ug | Freq: Once | INTRAMUSCULAR | Status: AC
  Administered 2013-03-17: 1000 ug via INTRAMUSCULAR

## 2013-04-14 ENCOUNTER — Ambulatory Visit (HOSPITAL_BASED_OUTPATIENT_CLINIC_OR_DEPARTMENT_OTHER): Payer: Medicare Other

## 2013-04-14 VITALS — BP 125/84 | HR 65 | Temp 98.6°F

## 2013-04-14 DIAGNOSIS — E538 Deficiency of other specified B group vitamins: Secondary | ICD-10-CM | POA: Diagnosis not present

## 2013-04-14 DIAGNOSIS — D518 Other vitamin B12 deficiency anemias: Secondary | ICD-10-CM

## 2013-04-14 MED ORDER — CYANOCOBALAMIN 1000 MCG/ML IJ SOLN
1000.0000 ug | Freq: Once | INTRAMUSCULAR | Status: AC
  Administered 2013-04-14: 1000 ug via INTRAMUSCULAR

## 2013-04-21 DIAGNOSIS — M79609 Pain in unspecified limb: Secondary | ICD-10-CM | POA: Diagnosis not present

## 2013-04-21 DIAGNOSIS — M19049 Primary osteoarthritis, unspecified hand: Secondary | ICD-10-CM | POA: Diagnosis not present

## 2013-04-23 ENCOUNTER — Other Ambulatory Visit: Payer: Self-pay

## 2013-05-12 ENCOUNTER — Telehealth: Payer: Self-pay | Admitting: *Deleted

## 2013-05-12 ENCOUNTER — Ambulatory Visit: Payer: Medicare Other

## 2013-05-12 NOTE — Telephone Encounter (Signed)
Patient called and left message that she is sick. She stated that she will call back tomorrow to reschedule. JMW

## 2013-05-13 ENCOUNTER — Other Ambulatory Visit: Payer: Self-pay | Admitting: Internal Medicine

## 2013-05-13 ENCOUNTER — Telehealth: Payer: Self-pay | Admitting: Internal Medicine

## 2013-05-13 DIAGNOSIS — I2510 Atherosclerotic heart disease of native coronary artery without angina pectoris: Secondary | ICD-10-CM

## 2013-05-13 DIAGNOSIS — E785 Hyperlipidemia, unspecified: Secondary | ICD-10-CM

## 2013-05-13 DIAGNOSIS — R899 Unspecified abnormal finding in specimens from other organs, systems and tissues: Secondary | ICD-10-CM

## 2013-05-13 DIAGNOSIS — D509 Iron deficiency anemia, unspecified: Secondary | ICD-10-CM

## 2013-05-13 DIAGNOSIS — R0602 Shortness of breath: Secondary | ICD-10-CM

## 2013-05-13 DIAGNOSIS — I25810 Atherosclerosis of coronary artery bypass graft(s) without angina pectoris: Secondary | ICD-10-CM

## 2013-05-13 DIAGNOSIS — R7989 Other specified abnormal findings of blood chemistry: Secondary | ICD-10-CM

## 2013-05-13 NOTE — Telephone Encounter (Signed)
New problem   Pt wants to know if she needs blood work before appt in september

## 2013-05-13 NOTE — Telephone Encounter (Signed)
Appointment 9/26 will forward to Dr Tenny Craw to see if she would like patient to have labs prior to ov, if so what she would like.

## 2013-05-13 NOTE — Telephone Encounter (Signed)
Patinet should have CBC, BMET, AST, Lipid panel, BNP

## 2013-05-14 NOTE — Telephone Encounter (Signed)
Advised patient, orders in EPIC, and scheduled labs

## 2013-05-19 ENCOUNTER — Telehealth: Payer: Self-pay | Admitting: Oncology

## 2013-05-21 ENCOUNTER — Telehealth: Payer: Self-pay

## 2013-05-21 DIAGNOSIS — I739 Peripheral vascular disease, unspecified: Secondary | ICD-10-CM

## 2013-05-21 NOTE — Telephone Encounter (Signed)
Phone call from pt.  Reports "sm. pea-sized ulcer on ball of right foot, just beneath the small toe."  States has had a red area that was caused by poorly fitting shoe, that would come and go.  Now reports the ulcer on ball of foot oozing fluid that has odor.  Also, states the side of the foot is tender.  Reports low grade fever of 99.1, recently.   Advised to soak foot in warm dial soap bath, cover with clean, dry dressing.  Recommended she contact her medical doctor tomorrow to get evaluated for possible need for antibiotic.  Will schedule appt. with [name] next week, 8/12, for ABI's and evaluation.  Pt. Agrees with plan.

## 2013-05-22 ENCOUNTER — Ambulatory Visit (INDEPENDENT_AMBULATORY_CARE_PROVIDER_SITE_OTHER): Payer: Medicare Other | Admitting: Internal Medicine

## 2013-05-22 ENCOUNTER — Telehealth: Payer: Self-pay | Admitting: Oncology

## 2013-05-22 ENCOUNTER — Telehealth: Payer: Self-pay | Admitting: Vascular Surgery

## 2013-05-22 ENCOUNTER — Inpatient Hospital Stay (HOSPITAL_COMMUNITY)
Admission: AD | Admit: 2013-05-22 | Discharge: 2013-06-02 | DRG: 252 | Disposition: A | Discharge status: Home Health | Payer: Medicare Other | Source: Ambulatory Visit | Attending: Internal Medicine | Admitting: Internal Medicine

## 2013-05-22 ENCOUNTER — Ambulatory Visit: Payer: Medicare Other | Admitting: Internal Medicine

## 2013-05-22 ENCOUNTER — Ambulatory Visit: Payer: Medicare Other

## 2013-05-22 VITALS — BP 114/67 | HR 76 | Temp 97.1°F | Wt 165.5 lb

## 2013-05-22 DIAGNOSIS — E872 Acidosis, unspecified: Secondary | ICD-10-CM | POA: Diagnosis present

## 2013-05-22 DIAGNOSIS — E10621 Type 1 diabetes mellitus with foot ulcer: Secondary | ICD-10-CM

## 2013-05-22 DIAGNOSIS — N179 Acute kidney failure, unspecified: Secondary | ICD-10-CM | POA: Diagnosis not present

## 2013-05-22 DIAGNOSIS — Z794 Long term (current) use of insulin: Secondary | ICD-10-CM

## 2013-05-22 DIAGNOSIS — E039 Hypothyroidism, unspecified: Secondary | ICD-10-CM | POA: Diagnosis present

## 2013-05-22 DIAGNOSIS — E119 Type 2 diabetes mellitus without complications: Secondary | ICD-10-CM | POA: Diagnosis not present

## 2013-05-22 DIAGNOSIS — Z853 Personal history of malignant neoplasm of breast: Secondary | ICD-10-CM | POA: Diagnosis not present

## 2013-05-22 DIAGNOSIS — R269 Unspecified abnormalities of gait and mobility: Secondary | ICD-10-CM | POA: Diagnosis present

## 2013-05-22 DIAGNOSIS — I10 Essential (primary) hypertension: Secondary | ICD-10-CM

## 2013-05-22 DIAGNOSIS — M19079 Primary osteoarthritis, unspecified ankle and foot: Secondary | ICD-10-CM | POA: Diagnosis present

## 2013-05-22 DIAGNOSIS — L98499 Non-pressure chronic ulcer of skin of other sites with unspecified severity: Secondary | ICD-10-CM | POA: Diagnosis not present

## 2013-05-22 DIAGNOSIS — N170 Acute kidney failure with tubular necrosis: Secondary | ICD-10-CM | POA: Diagnosis not present

## 2013-05-22 DIAGNOSIS — I252 Old myocardial infarction: Secondary | ICD-10-CM

## 2013-05-22 DIAGNOSIS — E1059 Type 1 diabetes mellitus with other circulatory complications: Secondary | ICD-10-CM | POA: Diagnosis not present

## 2013-05-22 DIAGNOSIS — E538 Deficiency of other specified B group vitamins: Secondary | ICD-10-CM | POA: Diagnosis not present

## 2013-05-22 DIAGNOSIS — I129 Hypertensive chronic kidney disease with stage 1 through stage 4 chronic kidney disease, or unspecified chronic kidney disease: Secondary | ICD-10-CM | POA: Diagnosis present

## 2013-05-22 DIAGNOSIS — E1151 Type 2 diabetes mellitus with diabetic peripheral angiopathy without gangrene: Secondary | ICD-10-CM

## 2013-05-22 DIAGNOSIS — L97509 Non-pressure chronic ulcer of other part of unspecified foot with unspecified severity: Secondary | ICD-10-CM | POA: Diagnosis not present

## 2013-05-22 DIAGNOSIS — Z113 Encounter for screening for infections with a predominantly sexual mode of transmission: Secondary | ICD-10-CM

## 2013-05-22 DIAGNOSIS — L97409 Non-pressure chronic ulcer of unspecified heel and midfoot with unspecified severity: Secondary | ICD-10-CM | POA: Diagnosis present

## 2013-05-22 DIAGNOSIS — E1069 Type 1 diabetes mellitus with other specified complication: Secondary | ICD-10-CM | POA: Diagnosis not present

## 2013-05-22 DIAGNOSIS — D518 Other vitamin B12 deficiency anemias: Secondary | ICD-10-CM

## 2013-05-22 DIAGNOSIS — S98139A Complete traumatic amputation of one unspecified lesser toe, initial encounter: Secondary | ICD-10-CM | POA: Diagnosis not present

## 2013-05-22 DIAGNOSIS — N19 Unspecified kidney failure: Secondary | ICD-10-CM | POA: Diagnosis not present

## 2013-05-22 DIAGNOSIS — I2510 Atherosclerotic heart disease of native coronary artery without angina pectoris: Secondary | ICD-10-CM

## 2013-05-22 DIAGNOSIS — E871 Hypo-osmolality and hyponatremia: Secondary | ICD-10-CM | POA: Diagnosis present

## 2013-05-22 DIAGNOSIS — Z9861 Coronary angioplasty status: Secondary | ICD-10-CM

## 2013-05-22 DIAGNOSIS — I5022 Chronic systolic (congestive) heart failure: Secondary | ICD-10-CM

## 2013-05-22 DIAGNOSIS — IMO0002 Reserved for concepts with insufficient information to code with codable children: Secondary | ICD-10-CM

## 2013-05-22 DIAGNOSIS — S98919A Complete traumatic amputation of unspecified foot, level unspecified, initial encounter: Secondary | ICD-10-CM

## 2013-05-22 DIAGNOSIS — E1169 Type 2 diabetes mellitus with other specified complication: Secondary | ICD-10-CM | POA: Diagnosis present

## 2013-05-22 DIAGNOSIS — I739 Peripheral vascular disease, unspecified: Secondary | ICD-10-CM

## 2013-05-22 DIAGNOSIS — A4902 Methicillin resistant Staphylococcus aureus infection, unspecified site: Secondary | ICD-10-CM | POA: Diagnosis not present

## 2013-05-22 DIAGNOSIS — I509 Heart failure, unspecified: Secondary | ICD-10-CM | POA: Diagnosis present

## 2013-05-22 DIAGNOSIS — E1065 Type 1 diabetes mellitus with hyperglycemia: Secondary | ICD-10-CM | POA: Diagnosis present

## 2013-05-22 DIAGNOSIS — I5040 Unspecified combined systolic (congestive) and diastolic (congestive) heart failure: Secondary | ICD-10-CM

## 2013-05-22 DIAGNOSIS — N183 Chronic kidney disease, stage 3 unspecified: Secondary | ICD-10-CM | POA: Diagnosis not present

## 2013-05-22 DIAGNOSIS — E7800 Pure hypercholesterolemia, unspecified: Secondary | ICD-10-CM | POA: Diagnosis present

## 2013-05-22 DIAGNOSIS — I359 Nonrheumatic aortic valve disorder, unspecified: Secondary | ICD-10-CM | POA: Diagnosis not present

## 2013-05-22 DIAGNOSIS — M869 Osteomyelitis, unspecified: Secondary | ICD-10-CM | POA: Diagnosis not present

## 2013-05-22 DIAGNOSIS — L03119 Cellulitis of unspecified part of limb: Secondary | ICD-10-CM | POA: Diagnosis present

## 2013-05-22 DIAGNOSIS — E1159 Type 2 diabetes mellitus with other circulatory complications: Secondary | ICD-10-CM | POA: Diagnosis not present

## 2013-05-22 DIAGNOSIS — I798 Other disorders of arteries, arterioles and capillaries in diseases classified elsewhere: Secondary | ICD-10-CM | POA: Diagnosis present

## 2013-05-22 DIAGNOSIS — I5042 Chronic combined systolic (congestive) and diastolic (congestive) heart failure: Secondary | ICD-10-CM | POA: Diagnosis present

## 2013-05-22 DIAGNOSIS — E1129 Type 2 diabetes mellitus with other diabetic kidney complication: Secondary | ICD-10-CM | POA: Diagnosis not present

## 2013-05-22 DIAGNOSIS — I70269 Atherosclerosis of native arteries of extremities with gangrene, unspecified extremity: Secondary | ICD-10-CM | POA: Diagnosis not present

## 2013-05-22 DIAGNOSIS — E1029 Type 1 diabetes mellitus with other diabetic kidney complication: Secondary | ICD-10-CM

## 2013-05-22 DIAGNOSIS — Z452 Encounter for adjustment and management of vascular access device: Secondary | ICD-10-CM | POA: Diagnosis not present

## 2013-05-22 DIAGNOSIS — L02419 Cutaneous abscess of limb, unspecified: Secondary | ICD-10-CM

## 2013-05-22 DIAGNOSIS — R05 Cough: Secondary | ICD-10-CM | POA: Diagnosis not present

## 2013-05-22 DIAGNOSIS — I369 Nonrheumatic tricuspid valve disorder, unspecified: Secondary | ICD-10-CM | POA: Diagnosis not present

## 2013-05-22 DIAGNOSIS — E109 Type 1 diabetes mellitus without complications: Secondary | ICD-10-CM | POA: Diagnosis not present

## 2013-05-22 DIAGNOSIS — Z951 Presence of aortocoronary bypass graft: Secondary | ICD-10-CM

## 2013-05-22 DIAGNOSIS — I96 Gangrene, not elsewhere classified: Secondary | ICD-10-CM | POA: Diagnosis not present

## 2013-05-22 DIAGNOSIS — N189 Chronic kidney disease, unspecified: Secondary | ICD-10-CM | POA: Diagnosis not present

## 2013-05-22 DIAGNOSIS — E785 Hyperlipidemia, unspecified: Secondary | ICD-10-CM | POA: Diagnosis present

## 2013-05-22 DIAGNOSIS — I350 Nonrheumatic aortic (valve) stenosis: Secondary | ICD-10-CM

## 2013-05-22 DIAGNOSIS — T50995A Adverse effect of other drugs, medicaments and biological substances, initial encounter: Secondary | ICD-10-CM | POA: Diagnosis present

## 2013-05-22 DIAGNOSIS — K219 Gastro-esophageal reflux disease without esophagitis: Secondary | ICD-10-CM | POA: Diagnosis present

## 2013-05-22 DIAGNOSIS — N039 Chronic nephritic syndrome with unspecified morphologic changes: Secondary | ICD-10-CM | POA: Diagnosis not present

## 2013-05-22 DIAGNOSIS — Z833 Family history of diabetes mellitus: Secondary | ICD-10-CM

## 2013-05-22 MED ORDER — FUROSEMIDE 20 MG PO TABS: 20.0000 mg | ORAL_TABLET | Freq: Every day | ORAL | Status: DC

## 2013-05-22 MED ORDER — ACETAMINOPHEN 325 MG PO TABS
650.0000 mg | ORAL_TABLET | Freq: Four times a day (QID) | ORAL | Status: DC | PRN
  Administered 2013-05-23: 650 mg via ORAL
  Filled 2013-05-22: qty 2

## 2013-05-22 MED ORDER — SODIUM CHLORIDE 0.9 % IV SOLN
INTRAVENOUS | Status: DC
  Administered 2013-05-23: 75 mL/h via INTRAVENOUS

## 2013-05-22 MED ORDER — ISOSORBIDE MONONITRATE ER 30 MG PO TB24
30.0000 mg | ORAL_TABLET | Freq: Every day | ORAL | Status: DC
  Administered 2013-05-23 – 2013-06-01 (×11): 30 mg via ORAL
  Filled 2013-05-22 (×15): qty 1

## 2013-05-22 MED ORDER — ASPIRIN EC 325 MG PO TBEC
325.0000 mg | DELAYED_RELEASE_TABLET | Freq: Every day | ORAL | Status: DC
  Administered 2013-05-24 – 2013-05-26 (×3): 325 mg via ORAL
  Filled 2013-05-22 (×7): qty 1

## 2013-05-22 MED ORDER — ATORVASTATIN CALCIUM 40 MG PO TABS
40.0000 mg | ORAL_TABLET | Freq: Every day | ORAL | Status: DC
  Administered 2013-05-23 – 2013-05-28 (×7): 40 mg via ORAL
  Filled 2013-05-22 (×13): qty 1

## 2013-05-22 MED ORDER — ONDANSETRON HCL 4 MG PO TABS: 4.0000 mg | ORAL_TABLET | Freq: Four times a day (QID) | ORAL | Status: DC | PRN

## 2013-05-22 MED ORDER — FUROSEMIDE 40 MG PO TABS
60.0000 mg | ORAL_TABLET | ORAL | Status: DC
  Administered 2013-05-23: 60 mg via ORAL
  Filled 2013-05-22 (×2): qty 1

## 2013-05-22 MED ORDER — CYANOCOBALAMIN 1000 MCG/ML IJ SOLN
1000.0000 ug | Freq: Once | INTRAMUSCULAR | Status: AC
  Administered 2013-05-22: 1000 ug via INTRAMUSCULAR

## 2013-05-22 MED ORDER — VANCOMYCIN HCL 10 G IV SOLR
1250.0000 mg | Freq: Once | INTRAVENOUS | Status: AC
  Administered 2013-05-23: 1250 mg via INTRAVENOUS
  Filled 2013-05-22: qty 1250

## 2013-05-22 MED ORDER — SODIUM CHLORIDE 0.9 % IJ SOLN
3.0000 mL | Freq: Two times a day (BID) | INTRAMUSCULAR | Status: DC
  Administered 2013-05-24 – 2013-05-25 (×2): 3 mL via INTRAVENOUS

## 2013-05-22 MED ORDER — ACETAMINOPHEN 650 MG RE SUPP: 650.0000 mg | Freq: Four times a day (QID) | RECTAL | Status: DC | PRN

## 2013-05-22 MED ORDER — LATANOPROST 0.005 % OP SOLN
1.0000 [drp] | Freq: Every day | OPHTHALMIC | Status: DC
  Filled 2013-05-22: qty 2.5

## 2013-05-22 MED ORDER — CEFEPIME HCL 1 G IJ SOLR
1.0000 g | Freq: Once | INTRAMUSCULAR | Status: DC
  Filled 2013-05-22: qty 1

## 2013-05-22 MED ORDER — EZETIMIBE 10 MG PO TABS
10.0000 mg | ORAL_TABLET | Freq: Every day | ORAL | Status: DC
  Administered 2013-05-23 – 2013-05-28 (×7): 10 mg via ORAL
  Filled 2013-05-22 (×13): qty 1

## 2013-05-22 MED ORDER — ONDANSETRON HCL 4 MG/2ML IJ SOLN
4.0000 mg | Freq: Four times a day (QID) | INTRAMUSCULAR | Status: DC | PRN
  Administered 2013-05-28 – 2013-06-02 (×5): 4 mg via INTRAVENOUS
  Filled 2013-05-22 (×4): qty 2

## 2013-05-22 MED ORDER — BRIMONIDINE TARTRATE 0.2 % OP SOLN
1.0000 [drp] | Freq: Two times a day (BID) | OPHTHALMIC | Status: DC
  Filled 2013-05-22: qty 5

## 2013-05-22 MED ORDER — FUROSEMIDE 40 MG PO TABS
40.0000 mg | ORAL_TABLET | ORAL | Status: DC
  Administered 2013-05-24 – 2013-05-25 (×2): 40 mg via ORAL
  Filled 2013-05-22 (×2): qty 1

## 2013-05-22 MED ORDER — METOPROLOL SUCCINATE ER 25 MG PO TB24
25.0000 mg | ORAL_TABLET | Freq: Every day | ORAL | Status: DC
  Administered 2013-05-23 – 2013-06-02 (×11): 25 mg via ORAL
  Filled 2013-05-22 (×12): qty 1

## 2013-05-22 MED ORDER — LEVOTHYROXINE SODIUM 125 MCG PO TABS
125.0000 ug | ORAL_TABLET | Freq: Every day | ORAL | Status: DC
  Administered 2013-05-23 – 2013-06-02 (×10): 125 ug via ORAL
  Filled 2013-05-22 (×15): qty 1

## 2013-05-22 MED ORDER — MORPHINE SULFATE 2 MG/ML IJ SOLN: 1.0000 mg | INTRAMUSCULAR | Status: DC | PRN

## 2013-05-22 MED ORDER — INSULIN ASPART 100 UNIT/ML ~~LOC~~ SOLN: 0.0000 [IU] | SUBCUTANEOUS | Status: DC

## 2013-05-22 MED ORDER — HEPARIN SODIUM (PORCINE) 5000 UNIT/ML IJ SOLN
5000.0000 [IU] | Freq: Three times a day (TID) | INTRAMUSCULAR | Status: DC
  Filled 2013-05-22 (×11): qty 1

## 2013-05-22 NOTE — Assessment & Plan Note (Signed)
Her A1C was 9.8 today which she was pleased with her result. Poorly controlled DM is contributing to her propensity to poorly healing wounds and she will cont to need intensive outpt DM mgmt.

## 2013-05-22 NOTE — Progress Notes (Addendum)
ANTIBIOTIC CONSULT NOTE - INITIAL  Pharmacy Consult for vancomycin, cefepime Indication: r/o osteomyelitis  Allergies  Allergen Reactions  . Adhesive (Tape) Other (See Comments)    "old Johnson/Johnson adhesive tape; takes my skin off; can use paper tape"  . Bactrim (Sulfamethoxazole W-Trimethoprim)     Hyperkalemia and Increased creatinine  . Cephalexin Swelling and Rash  . Ciprofloxacin Swelling and Rash    Patient Measurements: Height: 5\' 3"  (160 cm) IBW/kg (Calculated) : 52.4 Total Body Weight: 75.1 kg  Vital Signs: Temp: 99 F (37.2 C) (08/08 2007) Temp src: Oral (08/08 2007) BP: 158/55 mmHg (08/08 2007) Pulse Rate: 73 (08/08 2007) Intake/Output from previous day:   Intake/Output from this shift:    Labs: No results found for this basename: WBC, HGB, PLT, LABCREA, CREATININE,  in the last 72 hours The CrCl is unknown because both a height and weight (above a minimum accepted value) are required for this calculation. No results found for this basename: VANCOTROUGH, VANCOPEAK, VANCORANDOM, GENTTROUGH, GENTPEAK, GENTRANDOM, TOBRATROUGH, TOBRAPEAK, TOBRARND, AMIKACINPEAK, AMIKACINTROU, AMIKACIN,  in the last 72 hours   Microbiology: No results found for this or any previous visit (from the past 720 hour(s)).  Medical History: Past Medical History  Diagnosis Date  . HTN (hypertension)   . Hypothyroidism   . History of recurrent UTIs   . CAD (coronary artery disease)     s/p CABG x3 in 1993; last cath in 2010  . Scleroderma   . Bilateral carpal tunnel syndrome 1970s  . 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 Followed by [name]  . Claudication   . Aortic stenosis     0.8cm2 on echo in 10/2011  . Hyperlipidemia     on Lipitor  . Cat bite 2009    with MRSA; required I & D  . Heart murmur   . Blood transfusion     no reaction from transfusion; "when I had heart surgery" (08/28/2012)  . GERD  (gastroesophageal reflux disease)   . CHF (congestive heart failure) Aug. 2012    Echo 10/2011: Mild LVH, EF 30%, apex akinetic, septal HK, grade 2 diastolic dysfunction, or functionally bicuspid aortic valve, mild aortic stenosis by gradient, severe by calculated AVA-suspect A. Aortic stenosis probably moderate, trivial MR, mild LAE, mild RAE.   [name] Cellulitis   . Anemia   . B12 deficiency anemia     B 12 injection "monthly since 05/2012" (08/28/2012)  . Iron deficiency anemia 06/13/2012    "iron infusion" (08/28/2012  . Anginal pain     "history; not currently" (08/28/2012)  . Myocardial infarction 1986    "silent"  . History of bronchitis     "I've had it twice in my life" (08/28/2012)  . Exertional dyspnea     "because of the heart failure" (08/28/2012)  . Type 1 diabetes 10/1949  . Stomach ulcer 1981?    "on Tagament for ~ 1 yr" (08/28/2012)  . Seizure     ? seizure like event in 2010; workup included stress testing which led to repeat cath.   . Arthritis     "hands, hips, all over" (08/28/2012  . Osteoarthritis of both feet   . Breast cancer     left  . Cellulitis June 2013, Nov. 2013 and Feb. 14, 2014    Left leg    Medications:  Prescriptions prior to admission  Medication Sig Dispense Refill  . acetaminophen (TYLENOL)  325 MG tablet Take 650 mg by mouth every 6 (six) hours as needed for fever.      [name] aspirin EC 325 MG EC tablet Take 325 mg by mouth daily.       [name] atorvastatin (LIPITOR) 40 MG tablet Take 40 mg by mouth at bedtime.      . brimonidine (ALPHAGAN) 0.2 % ophthalmic solution Place 1 drop into both eyes 2 (two) times daily.        . cyanocobalamin (,VITAMIN B-12,) 1000 MCG/ML injection Inject 1,000 mcg into the muscle every 30 (thirty) days.      [name] docusate sodium (COLACE) 50 MG capsule Take by mouth 2 (two) times daily.      [name] ezetimibe (ZETIA) 10 MG tablet Take 10 mg by mouth at bedtime.      . Ferrous Sulfate (SLOW RELEASE IRON) 50 MG TBCR Take 50 mg by mouth  daily.       . furosemide (LASIX) 20 MG tablet Take 20-60 mg by mouth daily.      [name] ibuprofen (ADVIL,MOTRIN) 200 MG tablet Take 200 mg by mouth every 6 (six) hours as needed for pain or fever.      . insulin glargine (LANTUS) 100 UNIT/ML injection Inject 10-22 Units into the skin 2 (two) times daily. Take up to 22 units subcutaneously each morning and 10 units each evening.      . insulin lispro (HUMALOG PEN) 100 UNIT/ML injection Inject 0-10 Units into the skin 4 (four) times daily. Per sliding scale DO NOT GIVE WITHOUT CONSULTING PATIENT ON HER SLIDING SCALE      . isosorbide mononitrate (IMDUR) 30 MG 24 hr tablet Take 30 mg by mouth at bedtime.       [name] latanoprost (XALATAN) 0.005 % ophthalmic solution Place 1 drop into both eyes at bedtime.        [name] levothyroxine (SYNTHROID, LEVOTHROID) 125 MCG tablet Take 125 mcg by mouth daily.       . metoprolol succinate (TOPROL-XL) 25 MG 24 hr tablet Take 25 mg by mouth daily.       Assessment: 67 yo lady to start broad spectrum antibiotics for r/o osteomyelitis.  Her labs are pending and she has a h/o CKD.  Goal of Therapy:  Vancomycin trough level 15-20 mcg/ml  Plan:  Vancomycin 1250 mg IV X 1 now. Cefepime 1 gm IV now F/u labs for further doses   Freada Twersky Poteet 05/22/2013,10:19 PM  Addum:  D/C cefepime.  Start zosyn SrCr 1.36, est CrCl ~ 40 ml/min Cont vanc 1 gm IV q24 hours. Zosyn 3.375 gm IV q8 hours. F/u cultures, clinical course and renal function.

## 2013-05-22 NOTE — Progress Notes (Signed)
  Subjective:    Patient ID: [name], female    DOB: 04-May-1946, 67 y.o.   MRN: [id number]  Diabetes    [name] is a 67 yo with poorly controlled Type I DM, CAD s/p CABG, PVD s/p L fem to anterior tibial artery bypass, L forefoot amputation in 2011, and CKD. She comes today for an acute visit for a wound on her R foot. She had a pinpoint lesion that occasionally drained clear liquid for 3-4 months. She had it looked at and reportedly told it was OK. One week ago, the pinpoint hole increased in size, the drainage bc purulent, an odor developed, and the area bc red and painful. She has subjective fever and chills. This is on her lateral R foot and she thinks started 2/2 ill fitting shoes. She has been taking IBU despite her CKD. And using Epson salt baths and bandaging it with gauze.   Of note, she is scheduled to see Dr Arbie Cookey and get repeat ABI's on Tues the 12th.   Review of Systems  Constitutional: Positive for fever and chills.  Musculoskeletal: Positive for gait problem.  Skin: Positive for color change and wound.  otherwise, per HPI     Objective:   Physical Exam  Constitutional: She is oriented to person, place, and time. She appears well-developed and well-nourished. No distress.  HENT:  Head: Normocephalic and atraumatic.  Right Ear: External ear normal.  Left Ear: External ear normal.  Nose: Nose normal.  Eyes: Conjunctivae and EOM are normal.  Cardiovascular: Normal rate and regular rhythm.   Murmur heard. Slight arrythmia, not c/w A Fib, likely sinus arrythmia. DP and PT pulses not palp 2/2 edema. Monophasic by doppler B.   Pulmonary/Chest: Effort normal.  Musculoskeletal: Normal range of motion. She exhibits edema and tenderness.  Neurological: She is alert and oriented to person, place, and time.  Skin: Skin is warm and dry. She is not diaphoretic.  There is edema +1 (R>L) to knees B. There is erythemia of the RLE, c/w CVI likely. Her L forefoot has been  amputated. On the sole of the L foot, there is hypertrophic skin and a tiny linear tear about 1 cm in length. No surrounding arrythmia. Her R foot has an opening on the mid lateral foot about a pencil eraser head (0.5 cm) in size. There is white, purulent discharge and surrounding erythema. The wound can be probed to a depth of about 0.5 cm. The wound is painful. There is a strong yeast odor but no tinea on her feet.   Psychiatric: She has a normal mood and affect. Her behavior is normal. Judgment and thought content normal.          Assessment & Plan:

## 2013-05-22 NOTE — Telephone Encounter (Signed)
lvm for pt re appt info, asked pt to cb to confirm due to short amt of time - kf

## 2013-05-22 NOTE — Assessment & Plan Note (Signed)
My concern today is that this is osteomyelitis rather than a superficial ulcer / cellulitis. She is at high risk 2/2 PVD, uncontrolled DM, and the duration of an open wound. Therefore, will admit to the hospital, start IV ABX, obtain an MRI, and consult Dr Early (vascular). She has been informed of the need for this treatment plan but wanted to return home first to arrange things. Lela instructed her of the process for direct admit from home.

## 2013-05-22 NOTE — Assessment & Plan Note (Signed)
The IBU will not be cont in the hospital as she has baseline Stage III CKD.

## 2013-05-22 NOTE — Assessment & Plan Note (Signed)
She needed her B12 injection today and she was given it.

## 2013-05-22 NOTE — Patient Instructions (Addendum)
It was a pleasure to see you today in the internal medicine clinic. - You will be admitted to the hospital today for evaluation and treatment of your foot infection.  - You have received a Vitamin B12 shot today. You may experience some tenderness in the area over the next few days.

## 2013-05-22 NOTE — Progress Notes (Signed)
This is a Psychologist, occupational Note.  The care of the patient was discussed with Dr. Rogelia Boga and the assessment and plan was formulated with their assistance.  Please see their note for official documentation of the patient encounter.   Subjective:   Patient ID: [name] female   DOB: 1946-05-07 67 y.o.   MRN: [id number]  CC: Non-healing painful ulcer on right foot.   HPI: [name] is a 67 y.o. female with past medical history significant for CAD, AS, CHF (EF-30%), Peripheral vascular disease, hypothyroidism, IDDM, CKD (stage III) and a history of multiple BLE cellulitic infections.  Ms. [name] reports that she started to develop foot pain on the lateral aspect of her right foot 4 months ago after wearing ill-fitting shoes. She described the sore at that point to be red in color and tender similar to the sensation of a bruise. She continued to wear the same shoes and eventually developed a single pin-point drainage from the area 2-3 months ago. The drainage was initially clear but occasionally became white. The area intermittently healed and returned over the next few months. She reports that on Saturday (05/16/13) the area started to become more painful, enlarge, drain a white substance more noticeable that was very pungent and smelled similar to her previous infections. At that time she reports a fever of 100.8 and chills that have come and gone since that time. She describes the pain in her right foot as an 8/10, throbbing/pressure pain. She reports that the pain is worse on the sole of her foot and mostly localized to the periphery of her ulcer. She has been taking ibuprofen to treat the pain and reports that it reduces her pain to 2/10 and reduces her fever. She feels worse when she stands up initially, but indicates that after walking for a few minutes the pain is relieved slightly. She has been treating the lesion with Epsom salt and warm baths, but has not applied any other  topical agents. In the past she has taken Augmentin and Clindamycin in the past for other cellulitic infections, but reports that Augmentin has worked better in the past. She has a history of left forefoot amputation and left femoral to anterior tibial artery bypass graft.    Past Medical History  Diagnosis Date   HTN (hypertension)    Hypothyroidism    History of recurrent UTIs    CAD (coronary artery disease)     s/p CABG x3 in 1993; last cath in 2010   Scleroderma    Bilateral carpal tunnel syndrome 1970s   PVD (peripheral vascular disease)     Toes amputated from left foot and has had prior bypass on left leg. Followed by [name]   Claudication    Aortic stenosis     0.8cm2 on echo in 10/2011   Hyperlipidemia     on Lipitor   Cat bite 2009    with MRSA; required I & D   Heart murmur    Blood transfusion     no reaction from transfusion; "when I had heart surgery" (08/28/2012)   GERD (gastroesophageal reflux disease)    CHF (congestive heart failure) Aug. 2012    Echo 10/2011: Mild LVH, EF 30%, apex akinetic, septal HK, grade 2 diastolic dysfunction, or functionally bicuspid aortic valve, mild aortic stenosis by gradient, severe by calculated AVA-suspect A. Aortic stenosis probably moderate, trivial MR, mild LAE, mild RAE.    Cellulitis    Anemia    B12  deficiency anemia     B 12 injection "monthly since 05/2012" (08/28/2012)   Iron deficiency anemia 06/13/2012    "iron infusion" (08/28/2012   Anginal pain     "history; not currently" (08/28/2012)   Myocardial infarction 1986    "silent"   History of bronchitis     "I've had it twice in my life" (08/28/2012)   Exertional dyspnea     "because of the heart failure" (08/28/2012)   Type 1 diabetes 10/1949   Stomach ulcer 1981?    "on Tagament for ~ 1 yr" (08/28/2012)   Seizure     ? seizure like event in 2010; workup included stress testing which led to repeat cath.    Arthritis     "hands, hips,  all over" (08/28/2012   Osteoarthritis of both feet    Breast cancer     left   Cellulitis June 2013, Nov. 2013 and Feb. 14, 2014    Left leg   Current Outpatient Prescriptions  Medication Sig Dispense Refill   acetaminophen (TYLENOL) 100 MG/ML solution Take 10 mg/kg by mouth every 4 (four) hours as needed for fever.       aspirin EC 325 MG EC tablet Take 325 mg by mouth daily.         atorvastatin (LIPITOR) 40 MG tablet Take 40 mg by mouth at bedtime.       atorvastatin (LIPITOR) 40 MG tablet TAKE 1 TABLET (40 MG TOTAL) BY MOUTH AT BEDTIME.  30 tablet  2   brimonidine (ALPHAGAN) 0.2 % ophthalmic solution Place 1 drop into both eyes 2 (two) times daily.         cyanocobalamin (,VITAMIN B-12,) 1000 MCG/ML injection Inject 1,000 mcg into the muscle every 30 (thirty) days.       docusate sodium (COLACE) 50 MG capsule Take by mouth 2 (two) times daily.       ezetimibe (ZETIA) 10 MG tablet Take 10 mg by mouth at bedtime.       Ferrous Sulfate (SLOW RELEASE IRON) 50 MG TBCR Take 50 mg by mouth daily.        furosemide (LASIX) 20 MG tablet Take 40 mg by mouth. Take 60 mg (3 tabs) on Tuesday and Saturday. Take 40 mg all other days       ibuprofen (ADVIL,MOTRIN) 100 MG chewable tablet Chew 100 mg by mouth every 8 (eight) hours as needed for fever.       insulin glargine (LANTUS) 100 UNIT/ML injection Take 20 units subcutaneously each morning and 10 units each evening.       insulin lispro (HUMALOG PEN) 100 UNIT/ML injection Inject 0-10 Units into the skin 4 (four) times daily. Per sliding scale DO NOT GIVE WITHOUT CONSULTING PATIENT ON HER SLIDING SCALE       isosorbide mononitrate (IMDUR) 30 MG 24 hr tablet Take 30 mg by mouth daily.       isosorbide mononitrate (IMDUR) 30 MG 24 hr tablet TAKE 1 TABLET (30 MG TOTAL) BY MOUTH DAILY.  30 tablet  2   latanoprost (XALATAN) 0.005 % ophthalmic solution Place 1 drop into both eyes at bedtime.         levothyroxine (SYNTHROID,  LEVOTHROID) 125 MCG tablet Take 125 mcg by mouth daily.        metoprolol succinate (TOPROL-XL) 25 MG 24 hr tablet Take 25 mg by mouth daily.       clindamycin (CLEOCIN) 150 MG capsule Take 1 capsule (150 mg total) by mouth  4 (four) times daily.  28 capsule  0   clindamycin (CLEOCIN) 300 MG capsule Take 1 capsule (300 mg total) by mouth 4 (four) times daily.  28 capsule  0   furosemide (LASIX) 20 MG tablet Take 1 tablet (20 mg total) by mouth 2 (two) times a week. Takes 1 tablet on tuesdays and saturdays in addition with the 40mg   30 tablet     furosemide (LASIX) 20 MG tablet TAKE 1 TABLET (20 MG TOTAL) BY MOUTH DAILY.  30 tablet  2   furosemide (LASIX) 40 MG tablet Take 0.5 tablets (20 mg total) by mouth 3 (three) times daily.  45 tablet  3   Current Facility-Administered Medications  Medication Dose Route Frequency Provider Last Rate Last Dose   cyanocobalamin ((VITAMIN B-12)) injection 1,000 mcg  1,000 mcg Intramuscular Once Burns Spain, MD       Family History  Problem Relation Age of Onset   Diabetes Mother    History   Social History   Marital Status: Single    Spouse Name: N/A    Number of Children: N/A   Years of Education: N/A   Occupational History   Environmental health practitioner    Social History Main Topics   Smoking status: Never Smoker    Smokeless tobacco: Never Used   Alcohol Use: No   Drug Use: No   Sexually Active: Not Currently    Birth Control/ Protection: Post-menopausal   Other Topics Concern   None   Social History Narrative   None   Review of Systems: Constitutional: positive for chills, fevers and weight gain (8-10 lbs since April 2014) Eyes: negative for visual disturbance Ears, nose, mouth, throat, and face: negative Respiratory: positive for dyspnea on exertion Cardiovascular: positive for irregular heart beat and lower extremity edema Gastrointestinal: negative for change in bowel habits, nausea and  vomiting Musculoskeletal:positive for Non-healing ulceration on right foot, Dry feet on left foot along healed incision line s/p forefoot amputation.  Neurological: positive for paresthesia Endocrine: positive for diabetic symptoms including poor wound healing and skin dryness  Objective:  Physical Exam: Filed Vitals:   05/22/13 1423  BP: 114/67  Pulse: 76  Temp: 97.1 F (36.2 C)  TempSrc: Oral  Weight: 165 lb 8 oz (75.07 kg)  SpO2: 97%   General appearance: alert, cooperative and no distress Head: Normocephalic, without obvious abnormality, atraumatic Eyes: conjunctivae/corneas clear. PERRL, EOM's intact. Fundi benign. Ears: Normal hearing bilaterally  Nose: Nares normal. Septum midline. Mucosa normal. No drainage or sinus tenderness. Throat: lips, mucosa, and tongue normal; teeth and gums normal Lungs: clear to auscultation bilaterally Heart: regularly irregular rhythm and systolic murmur: holosystolic 2/6, crescendo and decrescendo at 2nd right intercostal space Abdomen: soft, non-tender; bowel sounds normal; no masses,  no organomegaly Extremities: ulceration on the left foot that appears to be the size of a quarter on the lateral border of the right foot with peripheral erythema extending 3 inches peripherally. The depth of the ulcer can't be appreciated clinically 2/2 to inflammation, but appear to be quite deep. There is a very pungent odor originating from the right foot ulcer. Sensation intact on sole of feet to light touch bilaterally. Left sole of foot has small superficial laceration that presents with mild dried heme.   Pulses: Doppler reveals soft thready BLE pulses at dorsal pedal and posterior tibial. 3+ pitting edema present on BLE.  Skin: dry skin on left foot along previously healed incision.  Neurologic: Grossly normal CN II-XII grossly  in tact.   Assessment & Plan:  1. Findings consistent with non-healing diabetic ulcer with diffuse cellulitis vs osteomyelitis.  Considering the patients history of peripheral vascular disease, uncontrolled diabetes and frank ulceration with worsening symptoms, we intend to admit patient with plans of obtaining MRI to rule out osteomyelitis and begin IV antibiotics.   2. Patient also missed her appointment for Vitamin B12 supplement injection today. We will provide this injection in the outpatient clinic.

## 2013-05-23 ENCOUNTER — Inpatient Hospital Stay (HOSPITAL_COMMUNITY): Payer: Medicare Other

## 2013-05-23 DIAGNOSIS — I129 Hypertensive chronic kidney disease with stage 1 through stage 4 chronic kidney disease, or unspecified chronic kidney disease: Secondary | ICD-10-CM

## 2013-05-23 DIAGNOSIS — I70269 Atherosclerosis of native arteries of extremities with gangrene, unspecified extremity: Secondary | ICD-10-CM

## 2013-05-23 LAB — CBC
MCH: 31.6 pg (ref 26.0–34.0)
MCV: 91.3 fL (ref 78.0–100.0)
Platelets: 249 10*3/uL (ref 150–400)
RDW: 14.4 % (ref 11.5–15.5)
WBC: 9.4 10*3/uL (ref 4.0–10.5)

## 2013-05-23 LAB — BASIC METABOLIC PANEL
BUN: 23 mg/dL (ref 6–23)
CO2: 26 mEq/L (ref 19–32)
CO2: 25 mEq/L (ref 19–32)
Calcium: 9.5 mg/dL (ref 8.4–10.5)
Calcium: 8.8 mg/dL (ref 8.4–10.5)
Creatinine, Ser: 1.36 mg/dL — ABNORMAL HIGH (ref 0.50–1.10)
Creatinine, Ser: 1.38 mg/dL — ABNORMAL HIGH (ref 0.50–1.10)
GFR calc non Af Amer: 39 mL/min — ABNORMAL LOW (ref >90)
GFR calc non Af Amer: 39 mL/min — ABNORMAL LOW (ref >90)
Glucose, Bld: 111 mg/dL — ABNORMAL HIGH (ref 70–99)
Glucose, Bld: 105 mg/dL — ABNORMAL HIGH (ref 70–99)

## 2013-05-23 LAB — CBC WITH DIFFERENTIAL/PLATELET
Basophils Absolute: 0 10*3/uL (ref 0.0–0.1)
Eosinophils Absolute: 0.4 10*3/uL (ref 0.0–0.7)
Eosinophils Relative: 3 % (ref 0–5)
HCT: 36.8 % (ref 36.0–46.0)
Lymphocytes Relative: 11 % — ABNORMAL LOW (ref 12–46)
MCH: 31.5 pg (ref 26.0–34.0)
MCV: 92 fL (ref 78.0–100.0)
Monocytes Absolute: 0.8 10*3/uL (ref 0.1–1.0)
RDW: 14.2 % (ref 11.5–15.5)
WBC: 11.4 10*3/uL — ABNORMAL HIGH (ref 4.0–10.5)

## 2013-05-23 LAB — GLUCOSE, CAPILLARY
Glucose-Capillary: 82 mg/dL (ref 70–99)
Glucose-Capillary: 165 mg/dL — ABNORMAL HIGH (ref 70–99)

## 2013-05-23 MED ORDER — PIPERACILLIN-TAZOBACTAM 3.375 G IVPB
3.3750 g | Freq: Three times a day (TID) | INTRAVENOUS | Status: DC
  Administered 2013-05-23 (×2): 3.375 g via INTRAVENOUS
  Filled 2013-05-23 (×5): qty 50

## 2013-05-23 MED ORDER — VANCOMYCIN HCL IN DEXTROSE 1-5 GM/200ML-% IV SOLN
1000.0000 mg | INTRAVENOUS | Status: DC
  Filled 2013-05-23: qty 200

## 2013-05-23 MED ORDER — GADOBENATE DIMEGLUMINE 529 MG/ML IV SOLN
15.0000 mL | Freq: Once | INTRAVENOUS | Status: AC | PRN
  Administered 2013-05-23: 15 mL via INTRAVENOUS

## 2013-05-23 MED ORDER — INSULIN GLARGINE 100 UNIT/ML ~~LOC~~ SOLN
5.0000 [IU] | Freq: Two times a day (BID) | SUBCUTANEOUS | Status: DC
  Administered 2013-05-23 (×2): 5 [IU] via SUBCUTANEOUS
  Filled 2013-05-23 (×4): qty 0.05

## 2013-05-23 MED ORDER — VANCOMYCIN HCL IN DEXTROSE 1-5 GM/200ML-% IV SOLN
1000.0000 mg | INTRAVENOUS | Status: DC
  Administered 2013-05-23 – 2013-05-25 (×3): 1000 mg via INTRAVENOUS
  Filled 2013-05-23 (×4): qty 200

## 2013-05-23 MED ORDER — MORPHINE SULFATE 2 MG/ML IJ SOLN
0.5000 mg | INTRAMUSCULAR | Status: DC | PRN
  Filled 2013-05-23: qty 1

## 2013-05-23 NOTE — Progress Notes (Signed)
Nutrition Brief Note  Patient identified on the Malnutrition Screening Tool (MST) Report for unsure of weight loss. Per review of usual weights below, weight has been stable. Patient reports good appetite and good PO intake PTA.  Wt Readings from Last 15 Encounters:  05/23/13 168 lb (76.204 kg)  05/22/13 165 lb 8 oz (75.07 kg)  12/05/12 164 lb 1.6 oz (74.435 kg)  11/30/12 162 lb 7.7 oz (73.7 kg)  11/26/12 156 lb 11.2 oz (71.079 kg)  11/15/12 157 lb (71.215 kg)  10/27/12 156 lb (70.761 kg)  09/05/12 160 lb (72.576 kg)  09/04/12 160 lb 12.8 oz (72.938 kg)  09/02/12 163 lb 3.2 oz (74.027 kg)  08/28/12 155 lb (70.308 kg)  08/07/12 155 lb 8 oz (70.534 kg)  06/17/12 159 lb 4.8 oz (72.258 kg)  06/12/12 166 lb (75.297 kg)  06/03/12 169 lb 4.8 oz (76.794 kg)    Body mass index is 29.77 kg/(m^2). Patient meets criteria for overweight based on current BMI.   Current diet order is NPO for possible surgery soon. Labs and medications reviewed.   No nutrition interventions warranted at this time. If nutrition issues arise, please consult RD.   Joaquin Courts, RD, LDN, CNSC Pager [phone] After Hours Pager [phone]

## 2013-05-23 NOTE — H&P (Signed)
Date: 05/23/2013               Patient Name:  [name] MRN: [id number]  DOB: December 04, 1945 Age / Sex: 67 y.o., female   PCP: [name], MD         Medical Service: Internal Medicine Teaching Service         Attending Physician: Dr. [name], DO    First Contact: MS4 Eustace Quail Pager: [phone]  Second Contact: Dr. Dede Query Pager: [phone]       After Hours (After 5p/  First Contact Pager: [phone]  weekends / holidays): Second Contact Pager: [phone]   Chief Complaint: Right foot wound  History of Present Illness:  [name] is a 67 yo with poorly controlled Type I DM, CAD s/p CABG, PVD s/p L fem to anterior tibial artery bypass, L forefoot amputation in 2011, and CKD who presents from the [facility] with right foot wound.   She was seen in clinic today (by Dr. Rogelia Boga) for an acute visit for purulent wound on her lateral R foot. She states it started 3 months ago as a pinpoint lesion that occasionally drained clear liquid. She thinks it may have started secondary to ill fitting shoes. She had it looked at by her cardiologist about 1 month ago, who reportedly told her it was OK. One week ago, however, the pinpoint hole increased in size, the drainage became purulent, a foul odor developed, and the area became red and painful. She has had subjective fever and chills ever since. She also has sensitive, reddened skin over her right shin, which according to her is chronic 2/2 venous insufficiency, however she thinks it may be a little more red than normal today.  She has been managing the wound with Epson salt baths and gauze bandages soaked in neosporin. She has been taking ibuprofen for pain even though she know she should not because of her CKD.    Meds: Current Facility-Administered Medications  Medication Dose Route Frequency Provider Last Rate Last Dose  . 0.9 %  sodium chloride infusion   Intravenous Continuous Genelle Gather, MD 75 mL/hr at 05/23/13 0023 75 mL/hr at 05/23/13  0023  . acetaminophen (TYLENOL) tablet 650 mg  650 mg Oral Q6H PRN Genelle Gather, MD   650 mg at 05/23/13 0023   Or  . acetaminophen (TYLENOL) suppository 650 mg  650 mg Rectal Q6H PRN Genelle Gather, MD      . aspirin EC tablet 325 mg  325 mg Oral Daily Genelle Gather, MD      . atorvastatin (LIPITOR) tablet 40 mg  40 mg Oral QHS Genelle Gather, MD   40 mg at 05/23/13 0201  . brimonidine (ALPHAGAN) 0.2 % ophthalmic solution 1 drop  1 drop Both Eyes BID Genelle Gather, MD      . ezetimibe (ZETIA) tablet 10 mg  10 mg Oral QHS Genelle Gather, MD   10 mg at 05/23/13 0201  . [START ON 05/24/2013] furosemide (LASIX) tablet 40 mg  40 mg Oral Custom [name], DO      . furosemide (LASIX) tablet 60 mg  60 mg Oral Custom [name], DO      . heparin injection 5,000 Units  5,000 Units Subcutaneous Q8H Genelle Gather, MD      . insulin aspart (novoLOG) injection 0-15 Units  0-15 Units Subcutaneous Q4H Genelle Gather, MD      . isosorbide mononitrate (IMDUR)  24 hr tablet 30 mg  30 mg Oral QHS Genelle Gather, MD   30 mg at 05/23/13 0056  . latanoprost (XALATAN) 0.005 % ophthalmic solution 1 drop  1 drop Both Eyes QHS Genelle Gather, MD      . levothyroxine (SYNTHROID, LEVOTHROID) tablet 125 mcg  125 mcg Oral QAC breakfast Genelle Gather, MD      . metoprolol succinate (TOPROL-XL) 24 hr tablet 25 mg  25 mg Oral Daily Genelle Gather, MD      . morphine 2 MG/ML injection 0.5-1 mg  0.5-1 mg Intravenous Q3H PRN Vivi Barrack, MD      . ondansetron [facility]) tablet 4 mg  4 mg Oral Q6H PRN Genelle Gather, MD       Or  . ondansetron [facility]) injection 4 mg  4 mg Intravenous Q6H PRN Genelle Gather, MD      . piperacillin-tazobactam (ZOSYN) IVPB 3.375 g  3.375 g Intravenous Q8H Alejandro Paya, DO   3.375 g at 05/23/13 0214  . sodium chloride 0.9 % injection 3 mL  3 mL Intravenous Q12H Genelle Gather, MD      . vancomycin (VANCOCIN) IVPB 1000 mg/200 mL premix  1,000 mg Intravenous Q24H [name], DO        Allergies: Allergies as of 05/22/2013 - Review Complete 05/22/2013  Allergen Reaction Noted  . Adhesive (tape) Other (See Comments) 06/11/2011  . Bactrim (sulfamethoxazole w-trimethoprim)  09/05/2012  . Cephalexin Swelling and Rash 02/03/2009  . Ciprofloxacin Swelling and Rash 02/03/2009   Past Medical History  Diagnosis Date  . HTN (hypertension)   . Hypothyroidism   . History of recurrent UTIs   . CAD (coronary artery disease)     s/p CABG x3 in 1993; last cath in 2010  . Scleroderma   . Bilateral carpal tunnel syndrome 1970s  . 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 Followed by [name]  . Claudication   . Aortic stenosis     0.8cm2 on echo in 10/2011  . Hyperlipidemia     on Lipitor  . Cat bite 2009    with MRSA; required I & D  . Heart murmur   . Blood transfusion     no reaction from transfusion; "when I had heart surgery" (08/28/2012)  . GERD (gastroesophageal reflux disease)   . CHF (congestive heart failure) Aug. 2012    Echo 10/2011: Mild LVH, EF 30%, apex akinetic, septal HK, grade 2 diastolic dysfunction, or functionally bicuspid aortic valve, mild aortic stenosis by gradient, severe by calculated AVA-suspect A. Aortic stenosis probably moderate, trivial MR, mild LAE, mild RAE.   [name] Cellulitis   . Anemia   . B12 deficiency anemia     B 12 injection "monthly since 05/2012" (08/28/2012)  . Iron deficiency anemia 06/13/2012    "iron infusion" (08/28/2012  . Anginal pain     "history; not currently" (08/28/2012)  . Myocardial infarction 1986    "silent"  . History of bronchitis     "I've had it twice in my life" (08/28/2012)  . Exertional dyspnea     "because of the heart failure" (08/28/2012)  . Type 1 diabetes 10/1949  . Stomach ulcer 1981?    "on Tagament for ~ 1 yr" (08/28/2012)  . Seizure     ? seizure like event in 2010; workup included stress testing which led to repeat  cath.   . Arthritis     "hands, hips, all over" (08/28/2012  . Osteoarthritis of both feet   . Breast cancer     left  . Cellulitis June 2013, Nov. 2013 and Feb. 14, 2014    Left leg   Past Surgical History  Procedure Laterality Date  . Carpal tunnel release  ~ 1970's    bilaterally  . Mastectomy  11/1982    Left Breast  . Vitrectomy  1990's-2004    "both eyes; I've had total of 4"  . Tubal ligation  1984  . Tonsillectomy  1952    "?adenoids"   . Appendectomy  1991  . Femoral bypass      left leg "related to transmetatarsal amputation" (08/28/2012)  . Breast biopsy with sentinel lymph node biopsy and needle localization  1984  . Coronary artery bypass graft  1992    LIMA to LAD, SVG to distal LCX & SVG to Marginal  . Cardiac catheterization  2010    LIMA to LAD patent yet with occluded distal LAD after graft insertion & retrograde is occluded. 3-4 septal perforators arise &are patent. SVG to a large marginal patent; SVG presumably to the distal dominant LCX is occluded, moderately high grade stenosis of the AV circumflex, but with distal stenoses involving a severely diseased marginal branch not amenable  to PCI  nor is inferior branch   . Cardiac catheterization  10/2011    "unsuccessful attempt of stenting" (08/28/2012)  . Cardiac catheterization  1992; 09/2011  . Breast biopsy    . Trigger finger release  1980's    right thumb  . Incision and drainage of wound  ~ 1967    "infection in left foot" (08/28/2012)  . Incision and drainage of wound  2009    "3 ORs on my right hand after cat bite" (08/28/2012)  . 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 Cataract extraction w/ intraocular lens  implant, bilateral  1990's   Family History  Problem Relation Age of Onset  . Diabetes Mother    History   Social History  . Marital Status: Single    Spouse Name: N/A    Number of Children: N/A  . Years of Education: N/A   Occupational History  .  Environmental health practitioner    Social History Main Topics  . Smoking status: Never Smoker   . Smokeless tobacco: Never Used  . Alcohol Use: No  . Drug Use: No  . Sexually Active: Not Currently    Birth Control/ Protection: Post-menopausal   Other Topics Concern  . Not on file   Social History Narrative  . No narrative on file    Review of Systems: Pertinent items are noted in HPI. 10 point review of systems performed  Physical Exam: Blood pressure 158/55, pulse 73, temperature 99 F (37.2 C), temperature source Oral, resp. rate 18, height 5\' 3"  (1.6 m), SpO2 100.00%. Physical Exam  Constitutional: She is oriented to person, place, and time and well-developed, well-nourished, and in no distress. No distress.  HENT:  Head: Normocephalic and atraumatic.  Eyes: Conjunctivae and EOM are normal. Pupils are equal, round, and reactive to light.  Neck: Normal range of motion. Neck supple.  Cardiovascular: Normal rate, regular rhythm and intact distal pulses.   Murmur (systolic) heard. Pulmonary/Chest: Effort normal and breath sounds normal. No respiratory distress. She has no wheezes. She has no rales. She exhibits no tenderness.  Musculoskeletal: Normal  range of motion.  Neurological: She is alert and oriented to person, place, and time. No cranial nerve deficit.  Skin: Skin is warm and dry. She is not diaphoretic.  1+ edema to knees bilaterally. Erythematous skin changes of RLE, according to patient she has this chronically 2/2 venous insufficiency but it's perhaps a little worse than normal. On the middle, lateral aspect of her R foot there is a wound about the size of a pencil eraser (0.5cm) with fibrinous exudate and surrounding erythema. It was probed to about 0.5cm. Unable to expel discharge from wound. The wound and surrounding skin (especially on the upper dorsum of the foot) are painful. There is a foul odor.  Psychiatric: Affect and judgment normal.     Lab results: Basic  Metabolic Panel:  Recent Labs  40/98/11 0015 05/23/13 0401  NA 135 136  K 4.4 4.3  CL 100 104  CO2 26 25  GLUCOSE 111* 105*  BUN 23 24*  CREATININE 1.36* 1.38*  CALCIUM 9.5 8.8  MG 2.1  --    CBC:  Recent Labs  05/23/13 0015 05/23/13 0401  WBC 11.4* 9.4  NEUTROABS 8.9*  --   HGB 12.6 11.3*  HCT 36.8 32.7*  MCV 92.0 91.3  PLT 301 249   CBG:  Recent Labs  05/22/13 1456 05/22/13 2301 05/23/13 0350  GLUCAP 196* 130* 99   Hemoglobin A1C:  Recent Labs  05/22/13 1510  HGBA1C 9.8   Misc. Labs: Magnesium  Date Value Range Status  05/23/2013 2.1  1.5 - 2.5 mg/dL Final    Imaging results: MRI pending   Assessment & Plan by Problem: [name] is a 67 yo with poorly controlled Type I DM, CAD s/p CABG, PVD s/p L fem to anterior tibial artery bypass, L forefoot amputation in 2011, and CKD who presents from the [facility] with right foot wound.  #Diabetic right midfoot ulcer - Signs of infection on exam (edema, pain, erythema, foul odor), patient with subjective fever and chills but no vital sign abnormalities currently. Concern for osteomyelitis vs. Cellulitis. She is at high risk for OM due to peripheral venous insufficiency, history of uncontrolled diabetes, and the fact that she waited 1 week to seek care for this worsening wound. - Admit to IMTS - MRI right foot with contrast - Consult vascular surgery in the am after MRI results obtained - IV zosyn and vancomycin, broad spectrum coverage for osteomyelitis - NS infusion @75ml /hr - Follow up blood cultures - Tylenol, morphine prn pain - Zofran prn nausea - Repeat BMP, CBC in the am  #Diabetes type 1 - HgA1C 9.8. CBGs 130-196. - NPO in case surgery is indicated tomorrow - Sliding scale novolog  #Stage III CKD - Cr 1.36 (baseline) - Avoid nephrotoxic agents, including ibuprofen  #CHF - Stable. - Continue home lasix, imdur, metoprolol  #PVD - Stable. Scheduled to see [name] (vascular) on Tuesday 8/12 for  repeat ABIs.  #HLD - continue home Lipitor, ezetimibe  #hypothyroidism - continue home synthroid  #DVT PPX - subq heparin, SCDs   Dispo: Disposition is deferred at this time, awaiting improvement of current medical problems. Anticipated discharge in approximately 1-3 day(s).   The patient does have a current PCP [name], MD) and does need an [facility] hospital follow-up appointment after discharge.  The patient does not have transportation limitations that hinder transportation to clinic appointments.  Signed: Vivi Barrack, MD 05/23/2013, 6:23 AM

## 2013-05-23 NOTE — Progress Notes (Signed)
I have seen the patient and reviewed the daily progress note by [name] 4 Hosie Spangle and discussed the care of the patient with them.  See below for documentation of my findings, assessment, and plans.  Subjective: Patient states that she feels ok except for right foot ulcer pain. No other c/o.  Objective: Vital signs in last 24 hours: Filed Vitals:   05/22/13 2007 05/23/13 0630 05/23/13 0820 05/23/13 0955  BP: 158/55 145/56  142/60  Pulse: 73 72  70  Temp: 99 F (37.2 C) 99 F (37.2 C)    TempSrc: Oral Oral    Resp: 18 16    Height: 5\' 3"  (1.6 m)     Weight:   168 lb (76.204 kg)   SpO2: 100% 98%     Weight change:   Intake/Output Summary (Last 24 hours) at 05/23/13 2221 Last data filed at 05/23/13 0848  Gross per 24 hour  Intake      0 ml  Output      0 ml  Net      0 ml   General:NAD Lungs : CTA Heart: RRR, No M/R/G Abd: soft, BS x 4 LE; B/L LE erythema, swelling, tenderness, and slight warmth to touch. Right foot 5th toe small ulcer with yellowish drainage. Lab Results: Reviewed and documented in Electronic Record Micro Results: Reviewed and documented in Electronic Record Studies/Results: Reviewed and documented in Electronic Record Medications: I have reviewed the patient's current medications. Scheduled Meds: . aspirin EC  325 mg Oral Daily  . atorvastatin  40 mg Oral QHS  . brimonidine  1 drop Both Eyes BID  . ezetimibe  10 mg Oral QHS  . [START ON 05/24/2013] furosemide  40 mg Oral Custom  . furosemide  60 mg Oral Custom  . heparin  5,000 Units Subcutaneous Q8H  . insulin glargine  5 Units Subcutaneous BID  . isosorbide mononitrate  30 mg Oral QHS  . latanoprost  1 drop Both Eyes QHS  . levothyroxine  125 mcg Oral QAC breakfast  . metoprolol succinate  25 mg Oral Daily  . sodium chloride  3 mL Intravenous Q12H  . vancomycin  1,000 mg Intravenous Q24H   Continuous Infusions:  PRN Meds:.acetaminophen, acetaminophen, morphine injection, ondansetron  (ZOFRAN) IV, ondansetron Assessment/Plan:  [name] is a 67 yo with poorly controlled Type I DM, CAD s/p CABG, PVD s/p L fem to anterior tibial artery bypass, L forefoot amputation in 2011, and CKD who presents from clinic with right foot ulcer evaluation. MRI indicated OM with the impression as follows.  Osteomyelitis involving the head of the fifth metatarsal and  adjacent to the proximal phalanx of the fifth toe. Probable septic  fifth MTP joint, with a large adjacent fluid collection probably  draining to the scan.  2. Diffuse subcutaneous edema. Although diffuse cellulitis in the  foot is not excluded, the associated subcutaneous infiltrative  enhancement is most prominent in the vicinity of the osteomyelitis  and draining fluid collection.  Patient has been followed by her vascular surgeon [name] and Dr. Myra Gianotti  as outpatient. [name] performed "left femoral to anterior tibial artery bypass using translocated nonreverse saphenous vein graft and open ray amputation left fourth and fifth toes" on 04/19/2009, left 3rd toe amputation on 07/07/09( Dr. Vergie Living left 1st and 2nd toes amputation on 10/25/2009.   The vascular surgeon on call Dr. Myra Gianotti is contacted for consultation of OM. And ID DR. Zenaida Niece dam is consulted for ABx management. He  agrees with the Vancomycin coverage for cellulitis for now.   Please see [name] 4 Hosie Spangle note for detailed discussion.     Dispo: Disposition is deferred at this time, awaiting improvement of current medical problems.  Anticipated discharge in approximately 3-4 day(s).   The patient does have a current PCP [name] Harp, MD) and does need an [facility] hospital follow-up appointment after discharge.  The patient does not have transportation limitations that hinder transportation to clinic appointments.  .Services Needed at time of discharge: Y = Yes, Blank = No PT:   OT:   RN:   Equipment:   Other:     LOS: 1 day   Dede Query, MD 05/23/2013, 10:21  PM

## 2013-05-23 NOTE — Progress Notes (Signed)
Subjective: Pt had one symptomatic CBG of 86 today. Described clammy feeling and alerted nurse. Resolved with juice. She is hungry now since she is NPO while awaiting MRI today. No other complaints. Pain in foot is unchanged.  Objective: Vital signs in last 24 hours: Filed Vitals:   05/22/13 2007 05/23/13 0630 05/23/13 0820 05/23/13 0955  BP: 158/55 145/56  142/60  Pulse: 73 72  70  Temp: 99 F (37.2 C) 99 F (37.2 C)    TempSrc: Oral Oral    Resp: 18 16    Height: 5\' 3"  (1.6 m)     Weight:   76.204 kg (168 lb)   SpO2: 100% 98%      Lab Results: CBG (last 3)   Recent Labs  05/23/13 0350 05/23/13 0750 05/23/13 1155  GLUCAP 99 128* 149*   CMP     Component Value Date/Time   NA 136 05/23/2013 0401   NA 137 08/07/2012 1101   NA 133* 11/29/2011 1629   K 4.3 05/23/2013 0401   K 4.1 08/07/2012 1101   CL 104 05/23/2013 0401   CL 102 08/07/2012 1101   CO2 25 05/23/2013 0401   CO2 25 08/07/2012 1101   GLUCOSE 105* 05/23/2013 0401   GLUCOSE 320* 08/07/2012 1101   GLUCOSE 313* 11/29/2011 1629   BUN 24* 05/23/2013 0401   BUN 25.0 08/07/2012 1101   BUN 39* 11/29/2011 1629   CREATININE 1.38* 05/23/2013 0401   CREATININE 1.31* 12/05/2012 1625   CREATININE 1.0 08/07/2012 1101   CALCIUM 8.8 05/23/2013 0401   CALCIUM 9.8 08/07/2012 1101   PROT 8.1 11/28/2012 1900   ALBUMIN 3.5 11/28/2012 1900   AST 42* 11/28/2012 1900   ALT 38* 11/28/2012 1900   ALKPHOS 167* 11/28/2012 1900   BILITOT 0.7 11/28/2012 1900   GFRNONAA 39* 05/23/2013 0401   GFRAA 45* 05/23/2013 0401     Micro Results: Recent Results (from the past 240 hour(s))  SURGICAL PCR SCREEN     Status: Abnormal   Collection Time    05/23/13  6:10 AM      Result Value Range Status   MRSA, PCR POSITIVE (*) NEGATIVE Final   Staphylococcus aureus POSITIVE (*) NEGATIVE Final   Comment:            The Xpert SA Assay (FDA     approved for NASAL specimens     in patients over 59 years of age),     is one component of     a comprehensive  surveillance     program.  Test performance has     been validated by [employer] for patients greater     than or equal to 29 year old.     It is not intended     to diagnose infection nor to     guide or monitor treatment.   Studies/Results: Dg Chest Port 1 View  05/23/2013   *RADIOLOGY REPORT*  Clinical Data: Cough, shortness of breath.  PORTABLE CHEST - 1 VIEW  Comparison: 11/28/2012  Findings: Previous CABG.  Heart size upper limits normal.  Lungs clear.  No effusion.  IMPRESSION:  No acute disease post CABG.   Original Report Authenticated By: D. Andria Rhein, MD   Medications: I have reviewed the patient's current medications. Scheduled Meds:  aspirin EC  325 mg Oral Daily   atorvastatin  40 mg Oral QHS   brimonidine  1 drop Both Eyes BID   ezetimibe  10  mg Oral QHS   [START ON 05/24/2013] furosemide  40 mg Oral Custom   furosemide  60 mg Oral Custom   heparin  5,000 Units Subcutaneous Q8H   insulin aspart  0-15 Units Subcutaneous Q4H   insulin glargine  5 Units Subcutaneous BID   isosorbide mononitrate  30 mg Oral QHS   latanoprost  1 drop Both Eyes QHS   levothyroxine  125 mcg Oral QAC breakfast   metoprolol succinate  25 mg Oral Daily   piperacillin-tazobactam (ZOSYN)  IV  3.375 g Intravenous Q8H   sodium chloride  3 mL Intravenous Q12H   vancomycin  1,000 mg Intravenous Q24H   PRN Meds:.acetaminophen, acetaminophen, morphine injection, ondansetron (ZOFRAN) IV, ondansetron   Assessment & Plan by Problem:  Ms Powless is a 67 yo with poorly controlled Type I DM, CAD s/p CABG, PVD s/p L fem to anterior tibial artery bypass, L forefoot amputation in 2011, and CKD who presents from clinic with right foot wound.   #Diabetic right midfoot ulcer - Signs of infection on exam (edema, pain, erythema, foul odor), patient with subjective fever and chills but no vital sign abnormalities currently. Pt's vitals are stable, afebrile, does not appear toxic. Concern for  osteomyelitis vs. Cellulitis. She is at high risk for OM due to peripheral venous insufficiency, history of uncontrolled diabetes, and the fact that she waited 1 week to seek care for this worsening wound.  - MRI right foot with and without contrast today  - Consult vascular surgery in the am after MRI results obtained  - IV zosyn and vancomycin, broad spectrum coverage for osteomyelitis started 8/8, on hold until MRI results back.  - NS infusion @75ml /hr  - Follow up blood cultures  - Tylenol, morphine prn pain  - Zofran prn nausea  - Repeat BMP, CBC  #Diabetes type 1 - HgA1C 9.8. CBGs 130-196.  - NPO while awaiting MRI   - Sliding scale novolog   #Stage III CKD - Cr 1.36 (baseline)  - Avoid nephrotoxic agents, including ibuprofen  #CHF - Stable.  - Continue home lasix, imdur, metoprolol   #PVD - Stable. Scheduled to see [name] (vascular) on Tuesday 8/12 for repeat ABIs.   #HLD - continue home Lipitor, ezetimibe   #hypothyroidism - continue home synthroid   #DVT PPX - subq heparin, SCDs   Dispo: Disposition is deferred at this time, awaiting improvement of current medical problems. Anticipated discharge in approximately 1-3 day(s).  The patient does have a current PCP [name], MD) and does need an [facility] hospital follow-up appointment after discharge.  The patient does not have transportation limitations that hinder transportation to clinic appointments.   This is a Psychologist, occupational Note.  The care of the patient was discussed with Dr. Kem Kays and the assessment and plan formulated with their assistance.  Please see their attached note for official documentation of the daily encounter.   LOS: 1 day   Hosie Spangle, [employer] Ms[facility], Acting Intern 05/23/2013, 2:43 PM

## 2013-05-23 NOTE — Consult Note (Signed)
[facility] [place]      Consult Note  Patient name: [name] MRN: [id number] DOB: 1946-01-14 Sex: female  Consulting Physician:  Family Medicine  Reason for Consult: No chief complaint on file.   HISTORY OF PRESENT ILLNESS: This is a 67 year old female patient of 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  She also underwent transmetatarsal amputation which healed.  She was admitted to the hospital on 05/23/2013 with a worsening right foot wound that has been present for 3 months.  She thinks that it was the result of poorly fitting shoes.  Approximately one week ago, it became worse and began draining foul smelling fluid.  It became red and painful.  She reports subjective fevers.  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  ABI's in 12/2012 were 1.2 on the left and 0.82 on the right with monophasic waveforms bilaterally.  She is a poorly controlled type 1 diabetic.  She has CAD and is s/p CABG in 1993.  She takes a full strength ASA and a betablocker.  She is medically managed for her hypercholesterolemia with a statin.  She is a non-somoker.  Past Medical History  Diagnosis Date  . HTN (hypertension)   . Hypothyroidism   . History of recurrent UTIs   . CAD (coronary artery disease)     s/p CABG x3 in 1993; last cath in 2010  . Scleroderma   . Bilateral carpal tunnel syndrome 1970s  . 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 Followed by [name]  . Claudication   . Aortic stenosis     0.8cm2 on echo in 10/2011  . Hyperlipidemia     on Lipitor  . Cat bite 2009    with MRSA; required I & D  . Heart murmur   . Blood transfusion     no reaction from transfusion; "when I had heart surgery" (08/28/2012)  . GERD (gastroesophageal reflux disease)   . CHF  (congestive heart failure) Aug. 2012    Echo 10/2011: Mild LVH, EF 30%, apex akinetic, septal HK, grade 2 diastolic dysfunction, or functionally bicuspid aortic valve, mild aortic stenosis by gradient, severe by calculated AVA-suspect A. Aortic stenosis probably moderate, trivial MR, mild LAE, mild RAE.   [name] Cellulitis   . Anemia   . B12 deficiency anemia     B 12 injection "monthly since 05/2012" (08/28/2012)  . Iron deficiency anemia 06/13/2012    "iron infusion" (08/28/2012  . Anginal pain     "history; not currently" (08/28/2012)  . Myocardial infarction 1986    "silent"  . History of bronchitis     "I've had it twice in my life" (08/28/2012)  . Exertional dyspnea     "because of the heart failure" (08/28/2012)  . Type 1 diabetes 10/1949  . Stomach ulcer 1981?    "on Tagament for ~ 1 yr" (08/28/2012)  . Seizure     ? seizure like event in 2010; workup included stress testing which led to repeat cath.   . Arthritis     "hands, hips, all over" (08/28/2012  . Osteoarthritis of both feet   . Breast cancer     left  . Cellulitis June 2013, Nov. 2013 and Feb. 14, 2014    Left leg  Past Surgical History  Procedure Laterality Date  . Carpal tunnel release  ~ 1970's    bilaterally  . Mastectomy  11/1982    Left Breast  . Vitrectomy  1990's-2004    "both eyes; I've had total of 4"  . Tubal ligation  1984  . Tonsillectomy  1952    "?adenoids"   . Appendectomy  1991  . Femoral bypass      left leg "related to transmetatarsal amputation" (08/28/2012)  . Breast biopsy with sentinel lymph node biopsy and needle localization  1984  . Coronary artery bypass graft  1992    LIMA to LAD, SVG to distal LCX & SVG to Marginal  . Cardiac catheterization  2010    LIMA to LAD patent yet with occluded distal LAD after graft insertion & retrograde is occluded. 3-4 septal perforators arise &are patent. SVG to a large marginal patent; SVG presumably to the distal dominant LCX is occluded,  moderately high grade stenosis of the AV circumflex, but with distal stenoses involving a severely diseased marginal branch not amenable  to PCI  nor is inferior branch   . Cardiac catheterization  10/2011    "unsuccessful attempt of stenting" (08/28/2012)  . Cardiac catheterization  1992; 09/2011  . Breast biopsy    . Trigger finger release  1980's    right thumb  . Incision and drainage of wound  ~ 1967    "infection in left foot" (08/28/2012)  . Incision and drainage of wound  2009    "3 ORs on my right hand after cat bite" (08/28/2012)  . 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 Cataract extraction w/ intraocular lens  implant, bilateral  1990's    History   Social History  . Marital Status: Single    Spouse Name: N/A    Number of Children: N/A  . Years of Education: N/A   Occupational History  . Environmental health practitioner    Social History Main Topics  . Smoking status: Never Smoker   . Smokeless tobacco: Never Used  . Alcohol Use: No  . Drug Use: No  . Sexually Active: Not Currently    Birth Control/ Protection: Post-menopausal   Other Topics Concern  . Not on file   Social History Narrative  . No narrative on file    Family History  Problem Relation Age of Onset  . Diabetes Mother     Allergies as of 05/22/2013 - Review Complete 05/22/2013  Allergen Reaction Noted  . Adhesive (tape) Other (See Comments) 06/11/2011  . Bactrim (sulfamethoxazole w-trimethoprim)  09/05/2012  . Cephalexin Swelling and Rash 02/03/2009  . Ciprofloxacin Swelling and Rash 02/03/2009    No current facility-administered medications on file prior to encounter.   Current Outpatient Prescriptions on File Prior to Encounter  Medication Sig Dispense Refill  . aspirin EC 325 MG EC tablet Take 325 mg by mouth daily.       [name] atorvastatin (LIPITOR) 40 MG tablet Take 40 mg by mouth at bedtime.      . brimonidine (ALPHAGAN) 0.2 % ophthalmic solution Place 1 drop  into both eyes 2 (two) times daily.        . cyanocobalamin (,VITAMIN B-12,) 1000 MCG/ML injection Inject 1,000 mcg into the muscle every 30 (thirty) days.      [name] docusate sodium (COLACE) 50 MG capsule Take by mouth 2 (two) times daily.      [name] ezetimibe (ZETIA) 10 MG tablet  Take 10 mg by mouth at bedtime.      . Ferrous Sulfate (SLOW RELEASE IRON) 50 MG TBCR Take 50 mg by mouth daily.       . insulin glargine (LANTUS) 100 UNIT/ML injection Inject 10-22 Units into the skin 2 (two) times daily. Take up to 22 units subcutaneously each morning and 10 units each evening.      . insulin lispro (HUMALOG PEN) 100 UNIT/ML injection Inject 0-10 Units into the skin 4 (four) times daily. Per sliding scale DO NOT GIVE WITHOUT CONSULTING PATIENT ON HER SLIDING SCALE      . isosorbide mononitrate (IMDUR) 30 MG 24 hr tablet Take 30 mg by mouth at bedtime.       [name] latanoprost (XALATAN) 0.005 % ophthalmic solution Place 1 drop into both eyes at bedtime.        [name] levothyroxine (SYNTHROID, LEVOTHROID) 125 MCG tablet Take 125 mcg by mouth daily.       . metoprolol succinate (TOPROL-XL) 25 MG 24 hr tablet Take 25 mg by mouth daily.         REVIEW OF SYSTEMS: See HPI, otherwise all systems are negative  PHYSICAL EXAMINATION: General: The patient appears their stated age.  Vital signs are BP 142/60  Pulse 70  Temp(Src) 99 F (37.2 C) (Oral)  Resp 16  Ht 5\' 3"  (1.6 m)  Wt 168 lb (76.204 kg)  BMI 29.77 kg/m2  SpO2 98% Pulmonary: Respirations are non-labored HEENT:  No gross abnormalities Abdomen: Soft and non-tender  Musculoskeletal: There are no major deformities.   Neurologic: No focal weakness or paresthesias are detected, Skin: Necrotic ulcer at the fifth metatarsal base with purulent drainage. Psychiatric: The patient has normal affect. Cardiovascular: There is a regular rate and rhythm without significant murmur appreciated. Palpable left tibial bypass. Palpable left femoral pulse and right femoral  pulse  Diagnostic Studies: Right Foot MRI:  Osteo of 5th toe with fluid     Assessment:  Right foot osteo in the setting of PVD Plan: This is a limb-threatening situation.  The patient will need formal angiography to better define her arterial anatomy.  She will need revascularization in addition to toe amputation.  I will schedule her angiogram for this Monday.  Continue IV antibiotics and leg elevation.     Jorge Ny, M.D. [facility] [place] Office: [phone] Pager:  [phone]

## 2013-05-23 NOTE — H&P (Signed)
INTERNAL MEDICINE TEACHING SERVICE Attending Admission Note  Date: 05/23/2013  Patient name: [name]  Medical record number: [id number]  Date of birth: 1946-04-23    I have seen and evaluated [name] and discussed their care with the Residency Team.  Records reviewed and patient examined. [name] has had previous admissions for cellulitis of her legs.  She has IDDM type 1, PVD s/p L fem to ant-tibial artery bypass, s/p L forefoot amputation 2011, with an infected diabetic ulcer on her right foot.  She states this started a few months ago as a small lesion that progressed to an ulcer.  She states she informed her cardiologist about it. This then progressed to the point that it increased in size, had purulent drainage, association with fever and chills.  She also admits to increased redness over her right leg. She is noted to have uncontrolled IDDM type 1 back to 2007. Her HgbA1C has ranged from a lowest of 9.2% to a high of 11% since then, with latest value of 9.8% yesterday.  She sees an endocrinologist for management. She states that she has hypoglycemic symptoms if her sugars reach the low 100's and therefore a HgbA1C of ~9% is "good for me".  She states her endocrinologist feels this as the best control she can get. I started to explain to her that she must slowly but confidently start to lower her blood sugar to gain better control.  Continued uncontrolled DM will lead to further complications.  Her medical complications including recurrent cellulitis and PVD are due to uncontrolled DM.  She stated that she "didn't want to hear it" and "you have an accusatory tone" and feels well with her current diabetic control. I pointed out that this degree of uncontrolled DM can lead to further complications including future additional amputations, infected ulcers, infected bone, MI, CVA and she stated that she understands these complications.   At this time, she has a deep ulcer noted  on her right foot, lateral fifth metatarsal with purulent malodorous material. I am concerned for underlying osteomyelitis.  Vanc and Zosyn have been started. MRI foot will be ordered to evaluate for osteomyelitis. She has palpable bilateral pulses. She is noted to have warm erythema throughout her R leg.  [name], [place] 8/9/201412:34 PM

## 2013-05-24 ENCOUNTER — Encounter (HOSPITAL_COMMUNITY): Payer: Self-pay | Admitting: *Deleted

## 2013-05-24 DIAGNOSIS — E119 Type 2 diabetes mellitus without complications: Secondary | ICD-10-CM

## 2013-05-24 DIAGNOSIS — E1069 Type 1 diabetes mellitus with other specified complication: Secondary | ICD-10-CM

## 2013-05-24 DIAGNOSIS — L97409 Non-pressure chronic ulcer of unspecified heel and midfoot with unspecified severity: Secondary | ICD-10-CM

## 2013-05-24 DIAGNOSIS — I509 Heart failure, unspecified: Secondary | ICD-10-CM

## 2013-05-24 DIAGNOSIS — I2510 Atherosclerotic heart disease of native coronary artery without angina pectoris: Secondary | ICD-10-CM

## 2013-05-24 DIAGNOSIS — I5022 Chronic systolic (congestive) heart failure: Secondary | ICD-10-CM

## 2013-05-24 DIAGNOSIS — I359 Nonrheumatic aortic valve disorder, unspecified: Secondary | ICD-10-CM

## 2013-05-24 DIAGNOSIS — N058 Unspecified nephritic syndrome with other morphologic changes: Secondary | ICD-10-CM

## 2013-05-24 DIAGNOSIS — E1029 Type 1 diabetes mellitus with other diabetic kidney complication: Secondary | ICD-10-CM

## 2013-05-24 DIAGNOSIS — M869 Osteomyelitis, unspecified: Secondary | ICD-10-CM

## 2013-05-24 LAB — BASIC METABOLIC PANEL
CO2: 24 mEq/L (ref 19–32)
Calcium: 8.9 mg/dL (ref 8.4–10.5)
Creatinine, Ser: 1.54 mg/dL — ABNORMAL HIGH (ref 0.50–1.10)
Glucose, Bld: 185 mg/dL — ABNORMAL HIGH (ref 70–99)

## 2013-05-24 LAB — CBC
HCT: 34.8 % — ABNORMAL LOW (ref 36.0–46.0)
Hemoglobin: 11.8 g/dL — ABNORMAL LOW (ref 12.0–15.0)
MCH: 31.1 pg (ref 26.0–34.0)
MCV: 91.8 fL (ref 78.0–100.0)
RBC: 3.79 MIL/uL — ABNORMAL LOW (ref 3.87–5.11)

## 2013-05-24 LAB — GLUCOSE, CAPILLARY

## 2013-05-24 MED ORDER — INSULIN ASPART 100 UNIT/ML ~~LOC~~ SOLN: 4.0000 [IU] | Freq: Three times a day (TID) | SUBCUTANEOUS | Status: DC

## 2013-05-24 MED ORDER — INSULIN ASPART 100 UNIT/ML ~~LOC~~ SOLN
2.0000 [IU] | Freq: Once | SUBCUTANEOUS | Status: AC
  Administered 2013-05-24: 2 [IU] via SUBCUTANEOUS

## 2013-05-24 MED ORDER — INSULIN GLARGINE 100 UNIT/ML ~~LOC~~ SOLN
5.0000 [IU] | Freq: Two times a day (BID) | SUBCUTANEOUS | Status: DC
  Administered 2013-05-24: 5 [IU] via SUBCUTANEOUS
  Filled 2013-05-24 (×3): qty 0.05

## 2013-05-24 MED ORDER — INSULIN ASPART 100 UNIT/ML ~~LOC~~ SOLN
6.0000 [IU] | Freq: Once | SUBCUTANEOUS | Status: AC
  Administered 2013-05-24: 6 [IU] via SUBCUTANEOUS

## 2013-05-24 MED ORDER — INSULIN GLARGINE 100 UNIT/ML ~~LOC~~ SOLN
5.0000 [IU] | Freq: Once | SUBCUTANEOUS | Status: AC
  Administered 2013-05-24: 5 [IU] via SUBCUTANEOUS
  Filled 2013-05-24 (×2): qty 0.05

## 2013-05-24 NOTE — Consult Note (Addendum)
Patient ID: [name] MRN: [id number], DOB/AGE: 1946-03-10   Admit date: 05/22/2013 Date of Consult: @TODAY @  Primary Physician: [name], MD Primary Cardiologist: Dietrich Pates MD  Pt. Profile: Patient is a 67 yo who I follow in clinic  We are asked to see for periop cardiac management.   Problem List: Past Medical History  Diagnosis Date  . HTN (hypertension)   . Hypothyroidism   . History of recurrent UTIs   . CAD (coronary artery disease)     s/p CABG x3 in 1993; last cath in 2010  . Scleroderma   . Bilateral carpal tunnel syndrome 1970s  . 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 Followed by [name]  . Claudication   . Aortic stenosis     0.8cm2 on echo in 10/2011  . Hyperlipidemia     on Lipitor  . Cat bite 2009    with MRSA; required I & D  . Heart murmur   . Blood transfusion     no reaction from transfusion; "when I had heart surgery" (08/28/2012)  . GERD (gastroesophageal reflux disease)   . CHF (congestive heart failure) Aug. 2012    Echo 10/2011: Mild LVH, EF 30%, apex akinetic, septal HK, grade 2 diastolic dysfunction, or functionally bicuspid aortic valve, mild aortic stenosis by gradient, severe by calculated AVA-suspect A. Aortic stenosis probably moderate, trivial MR, mild LAE, mild RAE.   [name] Cellulitis   . Anemia   . B12 deficiency anemia     B 12 injection "monthly since 05/2012" (08/28/2012)  . Iron deficiency anemia 06/13/2012    "iron infusion" (08/28/2012  . Anginal pain     "history; not currently" (08/28/2012)  . Myocardial infarction 1986    "silent"  . History of bronchitis     "I've had it twice in my life" (08/28/2012)  . Exertional dyspnea     "because of the heart failure" (08/28/2012)  . Type 1 diabetes 10/1949  . Stomach ulcer 1981?    "on Tagament for ~ 1 yr" (08/28/2012)  . Seizure     ? seizure like event in 2010; workup included stress testing which led to  repeat cath.   . Arthritis     "hands, hips, all over" (08/28/2012  . Osteoarthritis of both feet   . Breast cancer     left  . Cellulitis June 2013, Nov. 2013 and Feb. 14, 2014    Left leg    Past Surgical History  Procedure Laterality Date  . Carpal tunnel release  ~ 1970's    bilaterally  . Mastectomy  11/1982    Left Breast  . Vitrectomy  1990's-2004    "both eyes; I've had total of 4"  . Tubal ligation  1984  . Tonsillectomy  1952    "?adenoids"   . Appendectomy  1991  . Femoral bypass      left leg "related to transmetatarsal amputation" (08/28/2012)  . Breast biopsy with sentinel lymph node biopsy and needle localization  1984  . Coronary artery bypass graft  1992    LIMA to LAD, SVG to distal LCX & SVG to Marginal  . Cardiac catheterization  2010    LIMA to LAD patent yet with occluded distal LAD after graft insertion & retrograde is occluded. 3-4 septal perforators arise &are patent. SVG to a large marginal patent; SVG presumably to the distal dominant LCX is occluded, moderately  high grade stenosis of the AV circumflex, but with distal stenoses involving a severely diseased marginal branch not amenable  to PCI  nor is inferior branch   . Cardiac catheterization  10/2011    "unsuccessful attempt of stenting" (08/28/2012)  . Cardiac catheterization  1992; 09/2011  . Breast biopsy    . Trigger finger release  1980's    right thumb  . Incision and drainage of wound  ~ 1967    "infection in left foot" (08/28/2012)  . Incision and drainage of wound  2009    "3 ORs on my right hand after cat bite" (08/28/2012)  . 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 Cataract extraction w/ intraocular lens  implant, bilateral  1990's     Allergies:  Allergies  Allergen Reactions  . Adhesive (Tape) Other (See Comments)    "old [employer] adhesive tape; takes my skin off; can use paper tape"  . Bactrim (Sulfamethoxazole W-Trimethoprim)      Hyperkalemia and Increased creatinine  . Cephalexin Swelling and Rash  . Ciprofloxacin Swelling and Rash    HPI:  Patient is a 67 yo with a history of CAD (s/p CABG- cath in 09/2011 showed patent LIMA to LAD with occlusion of distal LAD; High grade stenosis of nondominant RCA; Signif disease of prox LCx; SVG to L PDA occluded, SVG to OM patent)  SHe is not felt to be a surgical candidate by cardiac surgery.  Plan for medical therapy.  She also has a history of aortic stenosis, DM, dyslipidemia, PVOD and CKD. Echo 10/2011: Mild LVH, EF 30%, apex akinetic, septal HK, grade 2 diastolic dysfunction, or functionally bicuspid aortic valve, mild aortic stenosis by gradient, severe by calculated AVA-suspect A. Aortic stenosis probably moderate, trivial MR, mild LAE, mild RAE.   Patient admitted yesterday with a R foot ulcer that has gotten worse over the past few months.  Now draining a purulent fluid  Patient seen by Dr Myra Gianotti.  MRI with evid of osteomyelitis Scheduled for angiogram and probable amputation.    I saw the patient in cardiology clinic in Jan 2014  Since then she denies CP  Breathing is stable  She does get winded with activity but no change  No SOB with rest.   Inpatient Medications:  . aspirin EC  325 mg Oral Daily  . atorvastatin  40 mg Oral QHS  . brimonidine  1 drop Both Eyes BID  . ezetimibe  10 mg Oral QHS  . furosemide  40 mg Oral Custom  . furosemide  60 mg Oral Custom  . heparin  5,000 Units Subcutaneous Q8H  . insulin glargine  5 Units Subcutaneous BID  . isosorbide mononitrate  30 mg Oral QHS  . latanoprost  1 drop Both Eyes QHS  . levothyroxine  125 mcg Oral QAC breakfast  . metoprolol succinate  25 mg Oral Daily  . sodium chloride  3 mL Intravenous Q12H  . vancomycin  1,000 mg Intravenous Q24H    Family History  Problem Relation Age of Onset  . Diabetes Mother      History   Social History  . Marital Status: Single    Spouse Name: N/A    Number of  Children: N/A  . Years of Education: N/A   Occupational History  . Environmental health practitioner    Social History Main Topics  . Smoking status: Never Smoker   . Smokeless tobacco: Never Used  . Alcohol Use:  No  . Drug Use: No  . Sexually Active: Not Currently    Birth Control/ Protection: Post-menopausal   Other Topics Concern  . Not on file   Social History Narrative  . No narrative on file     Review of Systems: All other systems reviewed and are otherwise negative except as noted above.  Physical Exam: Filed Vitals:   05/24/13 0545  BP: 125/46  Pulse: 77  Temp: 98.8 F (37.1 C)  Resp: 18    Intake/Output Summary (Last 24 hours) at 05/24/13 1041 Last data filed at 05/24/13 0913  Gross per 24 hour  Intake    250 ml  Output   1200 ml  Net   -950 ml    General: Well developed,  in no acute distress. Head: Normocephalic, atraumatic, sclera non-icteric Neck:  JVP not elevated. Lungs: Clear bilaterally to auscultation without wheezes, rales, or rhonchi. Breathing is unlabored. Heart: RRR with S1 S2. Gr II/VI systolic murmur at base. Abdomen: Soft, non-tender, non-distended with normoactive bowel sounds. No hepatomegaly. No rebound/guarding. No obvious abdominal masses. Extremities: s/p L amputation (metatarsal)  R foot wrapped.  No signif edema Neuro: Alert and oriented X 3. Moves all extremities spontaneously. Psych:  Responds to questions appropriately with a normal affect.  Labs: Results for orders placed during the hospital encounter of 05/22/13 (from the past 24 hour(s))  GLUCOSE, CAPILLARY     Status: Abnormal   Collection Time    05/23/13 11:55 AM      Result Value Range   Glucose-Capillary 149 (*) 70 - 99 mg/dL  GLUCOSE, CAPILLARY     Status: Abnormal   Collection Time    05/23/13  4:58 PM      Result Value Range   Glucose-Capillary 165 (*) 70 - 99 mg/dL  GLUCOSE, CAPILLARY     Status: Abnormal   Collection Time    05/23/13 10:21 PM      Result  Value Range   Glucose-Capillary 143 (*) 70 - 99 mg/dL  GLUCOSE, CAPILLARY     Status: Abnormal   Collection Time    05/24/13  3:21 AM      Result Value Range   Glucose-Capillary 191 (*) 70 - 99 mg/dL  CBC     Status: Abnormal   Collection Time    05/24/13  4:50 AM      Result Value Range   WBC 12.0 (*) 4.0 - 10.5 K/uL   RBC 3.79 (*) 3.87 - 5.11 MIL/uL   Hemoglobin 11.8 (*) 12.0 - 15.0 g/dL   HCT 16.1 (*) 09.6 - 04.5 %   MCV 91.8  78.0 - 100.0 fL   MCH 31.1  26.0 - 34.0 pg   MCHC 33.9  30.0 - 36.0 g/dL   RDW 40.9  81.1 - 91.4 %   Platelets 232  150 - 400 K/uL  BASIC METABOLIC PANEL     Status: Abnormal   Collection Time    05/24/13  4:50 AM      Result Value Range   Sodium 135  135 - 145 mEq/L   Potassium 4.0  3.5 - 5.1 mEq/L   Chloride 99  96 - 112 mEq/L   CO2 24  19 - 32 mEq/L   Glucose, Bld 185 (*) 70 - 99 mg/dL   BUN 28 (*) 6 - 23 mg/dL   Creatinine, Ser 7.82 (*) 0.50 - 1.10 mg/dL   Calcium 8.9  8.4 - 95.6 mg/dL   GFR calc non Af Denyse Dago  34 (*) >90 mL/min   GFR calc Af Amer 39 (*) >90 mL/min  GLUCOSE, CAPILLARY     Status: Abnormal   Collection Time    05/24/13  7:33 AM      Result Value Range   Glucose-Capillary 158 (*) 70 - 99 mg/dL    Radiology/Studies: Mr Foot Right W Wo Contrast  05/23/2013   *RADIOLOGY REPORT*  Clinical Data: Right foot wound, query soft tissue infection and / or osteomyelitis.  Foot erythema and pain.  MRI OF THE RIGHT FOREFOOT WITHOUT AND WITH CONTRAST  Technique:  Multiplanar, multisequence MR imaging was performed both before and after administration of intravenous contrast.  Contrast: 15mL MULTIHANCE GADOBENATE DIMEGLUMINE 529 MG/ML IV SOLN  Comparison: 04/04/2012  Findings: Despite efforts by the patient and technologist, motion artifact is present on some series of today's examination and could not be totally eliminated.  This reduces diagnostic sensitivity and specificity.  Abnormal edema and enhancement compatible with osteomyelitis involving  the head and distal metaphysis of the fifth metatarsal and the adjacent proximal phalanx of the fifth toe.  Adjacent 1.8 by 0.9 x 2.1 cm fluid collection may be draining to the skin surface on image 17 of series 12, and extends from the vicinity of the fifth MCP joint.  No additional significant abnormal osseous edema in the forefoot. No overt bony destruction of the articular margins.  There is diffuse subcutaneous edema tracking in the forefoot which could reflect cellulitis.  Low-level edema tracts along the plantar foot musculature.  IMPRESSION:  1.  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 Probable septic fifth MTP joint, with a large adjacent fluid collection probably draining to the scan. 2.  Diffuse subcutaneous edema.  Although diffuse cellulitis in the foot is not excluded, the associated subcutaneous infiltrative enhancement is most prominent in the vicinity of the osteomyelitis and draining fluid collection.   Original Report Authenticated By: Gaylyn Rong, M.D.   Dg Chest Port 1 View  05/23/2013   *RADIOLOGY REPORT*  Clinical Data: Cough, shortness of breath.  PORTABLE CHEST - 1 VIEW  Comparison: 11/28/2012  Findings: Previous CABG.  Heart size upper limits normal.  Lungs clear.  No effusion.  IMPRESSION:  No acute disease post CABG.   Original Report Authenticated By: D. Andria Rhein, MD    EKG:  None  ASSESSMENT AND PLAN:  Patient is a 67 yo with a history of DM and severe vascular disease. Presents with wound in R foot and evid of osteo Plan for angiogram and probable amputation Patient has signif coronary artery disease  She is rel asymptomatic though she is not that acitve. From a cardiac standpoint she is at  mod risk for some cardiac complication  Not prohibitive  I would get EKG and echo to redefine LVEF/AV.   I do not think further studies would lower  risk.  OK to proceed  Will follow in periop period Close  monitoring of BP, I/Os.  2.  CAD  As noted above  Severe native disease  No unstable symptoms  3.  Chronic systolic CHF  Keep on same meds  4.  AS  Mean gradient 26mm Hg in Jan 2013.  WIll have repeat done since 1 1/2 years.  5.  PVOD  6.  HL  Continue statin.  Scherrie Merritts 05/24/2013, 10:41 AM

## 2013-05-24 NOTE — Consult Note (Signed)
[facility]    Date of Admission:  05/22/2013  Date of Consult:  05/24/2013  Reason for Consult:: Osteomyelitis in diabetic  Referring Physician: Dr. Kem Kays   HPI: [name] is an 67 y.o. female with IDDM, pVD< sp bypass surgery prior L Trans metatarsal amputation for infection in left foot, presents with worsening of ulceration in her right foot. This lesion had initially started as a small what sounds like pressure ulcer that worsened with a pin sized area of drainage. This is initially clear drainage but worsened and she developed surrounding erythema and purulent drainage. She was admitted to the teaching service on Friday and started on broad-spectrum antibiotics in the form of vancomycin and Zosyn. Imaging via MRI has not disclosed  Osteomyelitis involving the head of the fifth metatarsal and  adjacent to the proximal phalanx of the fifth toe. Probable septic  fifth MTP joint, with a large adjacent fluid collection draining to skin  She is to have amputation of infected bone by [facility] once her vascular flow has been studied and optimized. I had recommended narrowing her abx to vancomycin ALONE to increase yield on intra-op cultures and she has stabilized on vancomycin monotherapy.   Past Medical History  Diagnosis Date  . HTN (hypertension)   . Hypothyroidism   . History of recurrent UTIs   . CAD (coronary artery disease)     s/p CABG x3 in 1993; last cath in 2010  . Scleroderma   . Bilateral carpal tunnel syndrome 1970s  . 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 Followed by [name]  . Claudication   . Aortic stenosis     0.8cm2 on echo in 10/2011  . Hyperlipidemia     on Lipitor  . Cat bite 2009    with MRSA; required I & D  . Heart murmur   . Blood transfusion     no reaction from transfusion; "when I had heart surgery" (08/28/2012)  . GERD (gastroesophageal reflux  disease)   . CHF (congestive heart failure) Aug. 2012    Echo 10/2011: Mild LVH, EF 30%, apex akinetic, septal HK, grade 2 diastolic dysfunction, or functionally bicuspid aortic valve, mild aortic stenosis by gradient, severe by calculated AVA-suspect A. Aortic stenosis probably moderate, trivial MR, mild LAE, mild RAE.   [name] Cellulitis   . Anemia   . B12 deficiency anemia     B 12 injection "monthly since 05/2012" (08/28/2012)  . Iron deficiency anemia 06/13/2012    "iron infusion" (08/28/2012  . Anginal pain     "history; not currently" (08/28/2012)  . Myocardial infarction 1986    "silent"  . History of bronchitis     "I've had it twice in my life" (08/28/2012)  . Exertional dyspnea     "because of the heart failure" (08/28/2012)  . Type 1 diabetes 10/1949  . Stomach ulcer 1981?    "on Tagament for ~ 1 yr" (08/28/2012)  . Seizure     ? seizure like event in 2010; workup included stress testing which led to repeat cath.   . Arthritis     "hands, hips, all over" (08/28/2012  . Osteoarthritis of both feet   . Breast cancer     left  . Cellulitis June 2013, Nov. 2013 and Feb. 14, 2014    Left leg    Past Surgical History  Procedure Laterality Date  .  Carpal tunnel release  ~ 1970's    bilaterally  . Mastectomy  11/1982    Left Breast  . Vitrectomy  1990's-2004    "both eyes; I've had total of 4"  . Tubal ligation  1984  . Tonsillectomy  1952    "?adenoids"   . Appendectomy  1991  . Femoral bypass      left leg "related to transmetatarsal amputation" (08/28/2012)  . Breast biopsy with sentinel lymph node biopsy and needle localization  1984  . Coronary artery bypass graft  1992    LIMA to LAD, SVG to distal LCX & SVG to Marginal  . Cardiac catheterization  2010    LIMA to LAD patent yet with occluded distal LAD after graft insertion & retrograde is occluded. 3-4 septal perforators arise &are patent. SVG to a large marginal patent; SVG presumably to the distal dominant LCX is  occluded, moderately high grade stenosis of the AV circumflex, but with distal stenoses involving a severely diseased marginal branch not amenable  to PCI  nor is inferior branch   . Cardiac catheterization  10/2011    "unsuccessful attempt of stenting" (08/28/2012)  . Cardiac catheterization  1992; 09/2011  . Breast biopsy    . Trigger finger release  1980's    right thumb  . Incision and drainage of wound  ~ 1967    "infection in left foot" (08/28/2012)  . Incision and drainage of wound  2009    "3 ORs on my right hand after cat bite" (08/28/2012)  . 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 Cataract extraction w/ intraocular lens  implant, bilateral  1990's  ergies:   Allergies  Allergen Reactions  . Adhesive (Tape) Other (See Comments)    "old [employer] adhesive tape; takes my skin off; can use paper tape"  . Bactrim (Sulfamethoxazole W-Trimethoprim)     Hyperkalemia and Increased creatinine  . Cephalexin Swelling and Rash  . Ciprofloxacin Swelling and Rash     Medications: I have reviewed patients current medications as documented in Epic Anti-infectives   Start     Dose/Rate Route Frequency Ordered Stop   05/23/13 2300  vancomycin (VANCOCIN) IVPB 1000 mg/200 mL premix  Status:  Discontinued     1,000 mg 200 mL/hr over 60 Minutes Intravenous Every 24 hours 05/23/13 0121 05/23/13 1642   05/23/13 2300  vancomycin (VANCOCIN) IVPB 1000 mg/200 mL premix     1,000 mg 200 mL/hr over 60 Minutes Intravenous Every 24 hours 05/23/13 1656     05/23/13 0130  piperacillin-tazobactam (ZOSYN) IVPB 3.375 g  Status:  Discontinued     3.375 g 12.5 mL/hr over 240 Minutes Intravenous Every 8 hours 05/23/13 0120 05/23/13 1642   05/22/13 2230  vancomycin (VANCOCIN) 1,250 mg in sodium chloride 0.9 % 250 mL IVPB     1,250 mg 166.7 mL/hr over 90 Minutes Intravenous  Once 05/22/13 2226 05/23/13 0226   05/22/13 2230  ceFEPIme (MAXIPIME) 1 g in dextrose 5 % 50 mL  IVPB  Status:  Discontinued     1 g 100 mL/hr over 30 Minutes Intravenous  Once 05/22/13 2226 05/23/13 0101      Social History:  reports that she has never smoked. She has never used smokeless tobacco. She reports that she does not drink alcohol or use illicit drugs.  Family History  Problem Relation Age of Onset  . Diabetes Mother     As in HPI and primary  teams notes otherwise 12 point review of systems is negative  Blood pressure 102/38, pulse 73, temperature 98 F (36.7 C), temperature source Oral, resp. rate 16, height 5\' 3"  (1.6 m), weight 167 lb 12.3 oz (76.1 kg), SpO2 100.00%. General: Alert and awake, oriented x3, not in any acute distress. HEENT: anicteric sclera, pupils reactive to light and accommodation, EOMI, oropharynx clear and without exudate CVS regular rate, normal r,  no murmur rubs or gallops Chest: clear to auscultation bilaterally, no wheezing, rales or rhonchi Abdomen: soft nontender, nondistended, normal bowel sounds, Extremities: no  clubbing or edema noted bilaterally Skin: Mid foot amputation site is clean, left. Right foot with lateral ulceration and drainage present is also an ulceration the bottom of her foot that is tender and with clear drainage. Neuro: nonfocal,  Results for orders placed during the hospital encounter of 05/22/13 (from the past 48 hour(s))  GLUCOSE, CAPILLARY     Status: Abnormal   Collection Time    05/22/13 11:01 PM      Result Value Range   Glucose-Capillary 130 (*) 70 - 99 mg/dL  CULTURE, BLOOD (ROUTINE X 2)     Status: None   Collection Time    05/22/13 11:59 PM      Result Value Range   Specimen Description BLOOD RIGHT ARM     Special Requests BOTTLES DRAWN AEROBIC ONLY 7CC     Culture  Setup Time       Value: 05/23/2013 11:17     Performed at [facility]   Culture       Value:        BLOOD CULTURE RECEIVED NO GROWTH TO DATE CULTURE WILL BE HELD FOR 5 DAYS BEFORE ISSUING A FINAL NEGATIVE REPORT     Performed  at [facility]   Report Status PENDING    BASIC METABOLIC PANEL     Status: Abnormal   Collection Time    05/23/13 12:15 AM      Result Value Range   Sodium 135  135 - 145 mEq/L   Potassium 4.4  3.5 - 5.1 mEq/L   Chloride 100  96 - 112 mEq/L   CO2 26  19 - 32 mEq/L   Glucose, Bld 111 (*) 70 - 99 mg/dL   BUN 23  6 - 23 mg/dL   Creatinine, Ser 1.61 (*) 0.50 - 1.10 mg/dL   Calcium 9.5  8.4 - 09.6 mg/dL   GFR calc non Af Amer 39 (*) >90 mL/min   GFR calc Af Amer 46 (*) >90 mL/min   Comment:            The eGFR has been calculated     using the CKD EPI equation.     This calculation has not been     validated in all clinical     situations.     eGFR's persistently     <90 mL/min signify     possible Chronic Kidney Disease.  MAGNESIUM     Status: None   Collection Time    05/23/13 12:15 AM      Result Value Range   Magnesium 2.1  1.5 - 2.5 mg/dL  CBC WITH DIFFERENTIAL     Status: Abnormal   Collection Time    05/23/13 12:15 AM      Result Value Range   WBC 11.4 (*) 4.0 - 10.5 K/uL   RBC 4.00  3.87 - 5.11 MIL/uL   Hemoglobin 12.6  12.0 - 15.0  g/dL   HCT 16.1  09.6 - 04.5 %   MCV 92.0  78.0 - 100.0 fL   MCH 31.5  26.0 - 34.0 pg   MCHC 34.2  30.0 - 36.0 g/dL   RDW 40.9  81.1 - 91.4 %   Platelets 301  150 - 400 K/uL   Neutrophils Relative % 78 (*) 43 - 77 %   Neutro Abs 8.9 (*) 1.7 - 7.7 K/uL   Lymphocytes Relative 11 (*) 12 - 46 %   Lymphs Abs 1.2  0.7 - 4.0 K/uL   Monocytes Relative 7  3 - 12 %   Monocytes Absolute 0.8  0.1 - 1.0 K/uL   Eosinophils Relative 3  0 - 5 %   Eosinophils Absolute 0.4  0.0 - 0.7 K/uL   Basophils Relative 0  0 - 1 %   Basophils Absolute 0.0  0.0 - 0.1 K/uL  CULTURE, BLOOD (ROUTINE X 2)     Status: None   Collection Time    05/23/13 12:15 AM      Result Value Range   Specimen Description BLOOD LEFT HAND     Special Requests BOTTLES DRAWN AEROBIC ONLY 2CC     Culture  Setup Time       Value: 05/23/2013 11:16     Performed at  [facility]   Culture       Value:        BLOOD CULTURE RECEIVED NO GROWTH TO DATE CULTURE WILL BE HELD FOR 5 DAYS BEFORE ISSUING A FINAL NEGATIVE REPORT     Performed at [facility]   Report Status PENDING    GLUCOSE, CAPILLARY     Status: None   Collection Time    05/23/13  3:50 AM      Result Value Range   Glucose-Capillary 99  70 - 99 mg/dL  BASIC METABOLIC PANEL     Status: Abnormal   Collection Time    05/23/13  4:01 AM      Result Value Range   Sodium 136  135 - 145 mEq/L   Potassium 4.3  3.5 - 5.1 mEq/L   Chloride 104  96 - 112 mEq/L   CO2 25  19 - 32 mEq/L   Glucose, Bld 105 (*) 70 - 99 mg/dL   BUN 24 (*) 6 - 23 mg/dL   Creatinine, Ser 7.82 (*) 0.50 - 1.10 mg/dL   Calcium 8.8  8.4 - 95.6 mg/dL   GFR calc non Af Amer 39 (*) >90 mL/min   GFR calc Af Amer 45 (*) >90 mL/min   Comment:            The eGFR has been calculated     using the CKD EPI equation.     This calculation has not been     validated in all clinical     situations.     eGFR's persistently     <90 mL/min signify     possible Chronic Kidney Disease.  CBC     Status: Abnormal   Collection Time    05/23/13  4:01 AM      Result Value Range   WBC 9.4  4.0 - 10.5 K/uL   RBC 3.58 (*) 3.87 - 5.11 MIL/uL   Hemoglobin 11.3 (*) 12.0 - 15.0 g/dL   HCT 21.3 (*) 08.6 - 57.8 %   MCV 91.3  78.0 - 100.0 fL   MCH 31.6  26.0 - 34.0 pg   MCHC  34.6  30.0 - 36.0 g/dL   RDW 45.4  09.8 - 11.9 %   Platelets 249  150 - 400 K/uL  SURGICAL PCR SCREEN     Status: Abnormal   Collection Time    05/23/13  6:10 AM      Result Value Range   MRSA, PCR POSITIVE (*) NEGATIVE   Staphylococcus aureus POSITIVE (*) NEGATIVE   Comment:            The Xpert SA Assay (FDA     approved for NASAL specimens     in patients over 65 years of age),     is one component of     a comprehensive surveillance     program.  Test performance has     been validated by [employer] for patients greater     than or equal  to 40 year old.     It is not intended     to diagnose infection nor to     guide or monitor treatment.  GLUCOSE, CAPILLARY     Status: None   Collection Time    05/23/13  6:24 AM      Result Value Range   Glucose-Capillary 82  70 - 99 mg/dL  GLUCOSE, CAPILLARY     Status: Abnormal   Collection Time    05/23/13  7:50 AM      Result Value Range   Glucose-Capillary 128 (*) 70 - 99 mg/dL  GLUCOSE, CAPILLARY     Status: Abnormal   Collection Time    05/23/13 11:55 AM      Result Value Range   Glucose-Capillary 149 (*) 70 - 99 mg/dL  GLUCOSE, CAPILLARY     Status: Abnormal   Collection Time    05/23/13  4:58 PM      Result Value Range   Glucose-Capillary 165 (*) 70 - 99 mg/dL  GLUCOSE, CAPILLARY     Status: Abnormal   Collection Time    05/23/13 10:21 PM      Result Value Range   Glucose-Capillary 143 (*) 70 - 99 mg/dL  GLUCOSE, CAPILLARY     Status: Abnormal   Collection Time    05/24/13  3:21 AM      Result Value Range   Glucose-Capillary 191 (*) 70 - 99 mg/dL  CBC     Status: Abnormal   Collection Time    05/24/13  4:50 AM      Result Value Range   WBC 12.0 (*) 4.0 - 10.5 K/uL   RBC 3.79 (*) 3.87 - 5.11 MIL/uL   Hemoglobin 11.8 (*) 12.0 - 15.0 g/dL   HCT 14.7 (*) 82.9 - 56.2 %   MCV 91.8  78.0 - 100.0 fL   MCH 31.1  26.0 - 34.0 pg   MCHC 33.9  30.0 - 36.0 g/dL   RDW 13.0  86.5 - 78.4 %   Platelets 232  150 - 400 K/uL  BASIC METABOLIC PANEL     Status: Abnormal   Collection Time    05/24/13  4:50 AM      Result Value Range   Sodium 135  135 - 145 mEq/L   Potassium 4.0  3.5 - 5.1 mEq/L   Chloride 99  96 - 112 mEq/L   CO2 24  19 - 32 mEq/L   Glucose, Bld 185 (*) 70 - 99 mg/dL   BUN 28 (*) 6 - 23 mg/dL   Creatinine, Ser 6.96 (*)  0.50 - 1.10 mg/dL   Calcium 8.9  8.4 - 16.1 mg/dL   GFR calc non Af Amer 34 (*) >90 mL/min   GFR calc Af Amer 39 (*) >90 mL/min   Comment:            The eGFR has been calculated     using the CKD EPI equation.     This calculation has  not been     validated in all clinical     situations.     eGFR's persistently     <90 mL/min signify     possible Chronic Kidney Disease.  GLUCOSE, CAPILLARY     Status: Abnormal   Collection Time    05/24/13  7:33 AM      Result Value Range   Glucose-Capillary 158 (*) 70 - 99 mg/dL  GLUCOSE, CAPILLARY     Status: Abnormal   Collection Time    05/24/13 12:12 PM      Result Value Range   Glucose-Capillary 214 (*) 70 - 99 mg/dL      Component Value Date/Time   SDES BLOOD LEFT HAND 05/23/2013 0015   SPECREQUEST BOTTLES DRAWN AEROBIC ONLY 2CC 05/23/2013 0015   CULT  Value:        BLOOD CULTURE RECEIVED NO GROWTH TO DATE CULTURE WILL BE HELD FOR 5 DAYS BEFORE ISSUING A FINAL NEGATIVE REPORT Performed at [facility] 05/23/2013 0015   REPTSTATUS PENDING 05/23/2013 0015   Mr Foot Right W Wo Contrast  05/23/2013   *RADIOLOGY REPORT*  Clinical Data: Right foot wound, query soft tissue infection and / or osteomyelitis.  Foot erythema and pain.  MRI OF THE RIGHT FOREFOOT WITHOUT AND WITH CONTRAST  Technique:  Multiplanar, multisequence MR imaging was performed both before and after administration of intravenous contrast.  Contrast: 15mL MULTIHANCE GADOBENATE DIMEGLUMINE 529 MG/ML IV SOLN  Comparison: 04/04/2012  Findings: Despite efforts by the patient and technologist, motion artifact is present on some series of today's examination and could not be totally eliminated.  This reduces diagnostic sensitivity and specificity.  Abnormal edema and enhancement compatible with osteomyelitis involving the head and distal metaphysis of the fifth metatarsal and the adjacent proximal phalanx of the fifth toe.  Adjacent 1.8 by 0.9 x 2.1 cm fluid collection may be draining to the skin surface on image 17 of series 12, and extends from the vicinity of the fifth MCP joint.  No additional significant abnormal osseous edema in the forefoot. No overt bony destruction of the articular margins.  There is diffuse  subcutaneous edema tracking in the forefoot which could reflect cellulitis.  Low-level edema tracts along the plantar foot musculature.  IMPRESSION:  1.  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 Probable septic fifth MTP joint, with a large adjacent fluid collection probably draining to the scan. 2.  Diffuse subcutaneous edema.  Although diffuse cellulitis in the foot is not excluded, the associated subcutaneous infiltrative enhancement is most prominent in the vicinity of the osteomyelitis and draining fluid collection.   Original Report Authenticated By: Gaylyn Rong, M.D.   Dg Chest Port 1 View  05/23/2013   *RADIOLOGY REPORT*  Clinical Data: Cough, shortness of breath.  PORTABLE CHEST - 1 VIEW  Comparison: 11/28/2012  Findings: Previous CABG.  Heart size upper limits normal.  Lungs clear.  No effusion.  IMPRESSION:  No acute disease post CABG.   Original Report Authenticated By: D. Hassell III,  MD     Recent Results (from the past 720 hour(s))  CULTURE, BLOOD (ROUTINE X 2)     Status: None   Collection Time    05/22/13 11:59 PM      Result Value Range Status   Specimen Description BLOOD RIGHT ARM   Final   Special Requests BOTTLES DRAWN AEROBIC ONLY [facility]   Final   Culture  Setup Time     Final   Value: 05/23/2013 11:17     Performed at [facility]   Culture     Final   Value:        BLOOD CULTURE RECEIVED NO GROWTH TO DATE CULTURE WILL BE HELD FOR 5 DAYS BEFORE ISSUING A FINAL NEGATIVE REPORT     Performed at [facility]   Report Status PENDING   Incomplete  CULTURE, BLOOD (ROUTINE X 2)     Status: None   Collection Time    05/23/13 12:15 AM      Result Value Range Status   Specimen Description BLOOD LEFT HAND   Final   Special Requests BOTTLES DRAWN AEROBIC ONLY 2CC   Final   Culture  Setup Time     Final   Value: 05/23/2013 11:16     Performed at [facility]   Culture     Final    Value:        BLOOD CULTURE RECEIVED NO GROWTH TO DATE CULTURE WILL BE HELD FOR 5 DAYS BEFORE ISSUING A FINAL NEGATIVE REPORT     Performed at [facility]   Report Status PENDING   Incomplete  SURGICAL PCR SCREEN     Status: Abnormal   Collection Time    05/23/13  6:10 AM      Result Value Range Status   MRSA, PCR POSITIVE (*) NEGATIVE Final   Staphylococcus aureus POSITIVE (*) NEGATIVE Final   Comment:            The Xpert SA Assay (FDA     approved for NASAL specimens     in patients over 61 years of age),     is one component of     a comprehensive surveillance     program.  Test performance has     been validated by [employer] for patients greater     than or equal to 57 year old.     It is not intended     to diagnose infection nor to     guide or monitor treatment.     Impression/Recommendation  67 yo with DM, PVD and osteomyelitis  #1 Osteomyelitis: --agree with continued vancomycin --WOULD ASK VVS TO SEND TISSUE FOR BACTERIAL AEROBIC, ANEROBIC CULTURES FROM THE OR --ALSO WOULD BE A GOOD IDEA TO HAVE THE MARGINS OF RESECTED TISSUE LOOKED AT BY PATHOLOGY TO SEE IF THEY ARE CLEAN OR WHETHER THEY ARE "CLEAN" --WOULD ADJUST ABX POST CULTURESIN THE OR --I WOULD TREAT HER FOR MINIMUM OF 2 WEEKS IF NOT 4 WEEKS OR LONGER DEPENDING ON SURGICAL MARGINS  #2 Screening: would check for HIV and Hep C   Thank you so much for this interesting consult  [facility] [phone] (pager) [phone] (office) 05/24/2013, 3:54 PM  Paulette Blanch [place] 05/24/2013, 3:54 PM

## 2013-05-24 NOTE — Progress Notes (Signed)
Subjective: Patient refused to take her am Lantus and is upset that her CBG went up after her lunch.   Objective: Vital signs in last 24 hours: Filed Vitals:   05/24/13 0502 05/24/13 0545 05/24/13 1048 05/24/13 1354  BP:  125/46  102/38  Pulse:  77  73  Temp:  98.8 F (37.1 C)  98 F (36.7 C)  TempSrc:  Oral  Oral  Resp:  18  16  Height:      Weight: 167 lb 14.4 oz (76.159 kg) 167 lb 15.9 oz (76.2 kg) 167 lb 12.3 oz (76.1 kg)   SpO2:  97%  100%   Weight change:   Intake/Output Summary (Last 24 hours) at 05/24/13 1737 Last data filed at 05/24/13 1452  Gross per 24 hour  Intake    620 ml  Output   2000 ml  Net  -1380 ml   General:NAD  Lungs : CTA  Heart: RRR, No M/R/G  Abd: soft, BS x 4  LE; B/L LE erythema, swelling, tenderness, and slight warmth to touch. Right foot 5th toe small ulcer with yellowish drainage.  Lab Results: Basic Metabolic Panel:  Recent Labs Lab 05/23/13 0015 05/23/13 0401 05/24/13 0450  [name] 135 136 135  K 4.4 4.3 4.0  CL 100 104 99  CO2 26 25 24   GLUCOSE 111* 105* 185*  BUN 23 24* 28*  CREATININE 1.36* 1.38* 1.54*  CALCIUM 9.5 8.8 8.9  MG 2.1  --   --    CBC:  Recent Labs Lab 05/23/13 0015 05/23/13 0401 05/24/13 0450  WBC 11.4* 9.4 12.0*  NEUTROABS 8.9*  --   --   HGB 12.6 11.3* 11.8*  HCT 36.8 32.7* 34.8*  MCV 92.0 91.3 91.8  PLT 301 249 232   CBG:  Recent Labs Lab 05/23/13 1658 05/23/13 2221 05/24/13 0321 05/24/13 0733 05/24/13 1212 05/24/13 1600  GLUCAP 165* 143* 191* 158* 214* 467*   Hemoglobin A1C:  Recent Labs Lab 05/22/13 1510  HGBA1C 9.8   Micro Results: Recent Results (from the past 240 hour(s))  CULTURE, BLOOD (ROUTINE X 2)     Status: None   Collection Time    05/22/13 11:59 PM      Result Value Range Status   Specimen Description BLOOD RIGHT ARM   Final   Special Requests BOTTLES DRAWN AEROBIC ONLY [facility]   Final   Culture  Setup Time     Final   Value: 05/23/2013 11:17     Performed at  [facility]   Culture     Final   Value:        BLOOD CULTURE RECEIVED NO GROWTH TO DATE CULTURE WILL BE HELD FOR 5 DAYS BEFORE ISSUING A FINAL NEGATIVE REPORT     Performed at [facility]   Report Status PENDING   Incomplete  CULTURE, BLOOD (ROUTINE X 2)     Status: None   Collection Time    05/23/13 12:15 AM      Result Value Range Status   Specimen Description BLOOD LEFT HAND   Final   Special Requests BOTTLES DRAWN AEROBIC ONLY 2CC   Final   Culture  Setup Time     Final   Value: 05/23/2013 11:16     Performed at [facility]   Culture     Final   Value:        BLOOD CULTURE RECEIVED NO GROWTH TO DATE CULTURE WILL BE HELD FOR 5 DAYS BEFORE  ISSUING A FINAL NEGATIVE REPORT     Performed at [facility]   Report Status PENDING   Incomplete  SURGICAL PCR SCREEN     Status: Abnormal   Collection Time    05/23/13  6:10 AM      Result Value Range Status   MRSA, PCR POSITIVE (*) NEGATIVE Final   Staphylococcus aureus POSITIVE (*) NEGATIVE Final   Comment:            The Xpert SA Assay (FDA     approved for NASAL specimens     in patients over 74 years of age),     is one component of     a comprehensive surveillance     program.  Test performance has     been validated by [employer] for patients greater     than or equal to 34 year old.     It is not intended     to diagnose infection nor to     guide or monitor treatment.   Studies/Results: Mr Foot Right W Wo Contrast  05/23/2013   *RADIOLOGY REPORT*  Clinical Data: Right foot wound, query soft tissue infection and / or osteomyelitis.  Foot erythema and pain.  MRI OF THE RIGHT FOREFOOT WITHOUT AND WITH CONTRAST  Technique:  Multiplanar, multisequence MR imaging was performed both before and after administration of intravenous contrast.  Contrast: 15mL MULTIHANCE GADOBENATE DIMEGLUMINE 529 MG/ML IV SOLN  Comparison: 04/04/2012  Findings: Despite efforts by the patient and technologist, motion  artifact is present on some series of today's examination and could not be totally eliminated.  This reduces diagnostic sensitivity and specificity.  Abnormal edema and enhancement compatible with osteomyelitis involving the head and distal metaphysis of the fifth metatarsal and the adjacent proximal phalanx of the fifth toe.  Adjacent 1.8 by 0.9 x 2.1 cm fluid collection may be draining to the skin surface on image 17 of series 12, and extends from the vicinity of the fifth MCP joint.  No additional significant abnormal osseous edema in the forefoot. No overt bony destruction of the articular margins.  There is diffuse subcutaneous edema tracking in the forefoot which could reflect cellulitis.  Low-level edema tracts along the plantar foot musculature.  IMPRESSION:  1.  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 Probable septic fifth MTP joint, with a large adjacent fluid collection probably draining to the scan. 2.  Diffuse subcutaneous edema.  Although diffuse cellulitis in the foot is not excluded, the associated subcutaneous infiltrative enhancement is most prominent in the vicinity of the osteomyelitis and draining fluid collection.   Original Report Authenticated By: Gaylyn Rong, M.D.   Dg Chest Port 1 View  05/23/2013   *RADIOLOGY REPORT*  Clinical Data: Cough, shortness of breath.  PORTABLE CHEST - 1 VIEW  Comparison: 11/28/2012  Findings: Previous CABG.  Heart size upper limits normal.  Lungs clear.  No effusion.  IMPRESSION:  No acute disease post CABG.   Original Report Authenticated By: D. Andria Rhein, MD   Medications: I have reviewed the patient's current medications. Scheduled Meds: . aspirin EC  325 mg Oral Daily  . atorvastatin  40 mg Oral QHS  . brimonidine  1 drop Both Eyes BID  . ezetimibe  10 mg Oral QHS  . furosemide  40 mg Oral Custom  . furosemide  60 mg Oral Custom  . heparin  5,000  Units Subcutaneous Q8H   . insulin aspart  6 Units Subcutaneous Once  . insulin glargine  5 Units Subcutaneous Once  . isosorbide mononitrate  30 mg Oral QHS  . latanoprost  1 drop Both Eyes QHS  . levothyroxine  125 mcg Oral QAC breakfast  . metoprolol succinate  25 mg Oral Daily  . sodium chloride  3 mL Intravenous Q12H  . vancomycin  1,000 mg Intravenous Q24H   Continuous Infusions:  PRN Meds:.acetaminophen, acetaminophen, morphine injection, ondansetron (ZOFRAN) IV, ondansetron Assessment/Plan: [name] is a 67 yo with poorly controlled Type I DM, CAD s/p CABG, PVD s/p L fem to anterior tibial artery bypass, L forefoot amputation in 2011, and CKD who presents from clinic with right foot ulcer evaluation. MRI indicated OM with the impression as follows.   # OM and cellulitis associated with Diabetic right midfoot ulcer -   Pt's vitals are stable, afebrile, does not appear toxic.   - Appreciate vascular surgeon input!! - NPO after MN for Sx in am - IV vancomycin - Follow up blood cultures  - Tylenol, morphine prn pain  - Zofran prn nausea   # uncontrolled Diabetes type 1 - HgA1C 9.8. CBGs 130-196.   - Lantus 5 BID - Novolog 4 units TID with meals with detailed instructions - SSI  #Stage III CKD - Cr 1.36 (baseline)  - Avoid nephrotoxic agents, including ibuprofen   #CHF - Stable.  - Continue home lasix, imdur, metoprolol   #PVD - Stable. Scheduled to see [name] (vascular) on Tuesday 8/12 for repeat ABIs.   #HLD - continue home Lipitor, ezetimibe   #Hypothyroidism - continue home synthroid   #DVT PPX - subq heparin     Dispo: Disposition is deferred at this time, awaiting improvement of current medical problems.  Anticipated discharge in approximately 1-2 day(s).   The patient does have a current PCP [name], MD) and does need an [facility] hospital follow-up appointment after discharge.  The patient does not have transportation limitations that hinder transportation to clinic  appointments.  .Services Needed at time of discharge: Y = Yes, Blank = No PT:   OT:   RN:   Equipment:   Other:     LOS: 2 days   Dede Query, MD 05/24/2013, 5:37 PM

## 2013-05-25 ENCOUNTER — Encounter (HOSPITAL_COMMUNITY)
Admission: AD | Disposition: A | Discharge status: Home Health | Payer: Self-pay | Source: Ambulatory Visit | Attending: Internal Medicine

## 2013-05-25 DIAGNOSIS — I739 Peripheral vascular disease, unspecified: Secondary | ICD-10-CM

## 2013-05-25 DIAGNOSIS — M869 Osteomyelitis, unspecified: Secondary | ICD-10-CM

## 2013-05-25 HISTORY — PX: LOWER EXTREMITY ANGIOGRAM: SHX5508

## 2013-05-25 LAB — GLUCOSE, CAPILLARY
Glucose-Capillary: 373 mg/dL — ABNORMAL HIGH (ref 70–99)
Glucose-Capillary: 351 mg/dL — ABNORMAL HIGH (ref 70–99)
Glucose-Capillary: 339 mg/dL — ABNORMAL HIGH (ref 70–99)
Glucose-Capillary: 308 mg/dL — ABNORMAL HIGH (ref 70–99)

## 2013-05-25 LAB — CBC
HCT: 32.4 % — ABNORMAL LOW (ref 36.0–46.0)
Hemoglobin: 11.2 g/dL — ABNORMAL LOW (ref 12.0–15.0)
Hemoglobin: 11.2 g/dL — ABNORMAL LOW (ref 12.0–15.0)
MCH: 31.3 pg (ref 26.0–34.0)
MCH: 31.5 pg (ref 26.0–34.0)
MCV: 90.5 fL (ref 78.0–100.0)
RBC: 3.58 MIL/uL — ABNORMAL LOW (ref 3.87–5.11)
RBC: 3.56 MIL/uL — ABNORMAL LOW (ref 3.87–5.11)

## 2013-05-25 LAB — BASIC METABOLIC PANEL
CO2: 23 mEq/L (ref 19–32)
Calcium: 8.7 mg/dL (ref 8.4–10.5)
Creatinine, Ser: 1.8 mg/dL — ABNORMAL HIGH (ref 0.50–1.10)
Glucose, Bld: 405 mg/dL — ABNORMAL HIGH (ref 70–99)

## 2013-05-25 LAB — PROTIME-INR: INR: 1.17 (ref 0.00–1.49)

## 2013-05-25 LAB — CREATININE, SERUM
Creatinine, Ser: 1.72 mg/dL — ABNORMAL HIGH (ref 0.50–1.10)
GFR calc non Af Amer: 30 mL/min — ABNORMAL LOW (ref >90)

## 2013-05-25 SURGERY — ANGIOGRAM, LOWER EXTREMITY
Anesthesia: LOCAL

## 2013-05-25 MED ORDER — INSULIN ASPART 100 UNIT/ML ~~LOC~~ SOLN
4.0000 [IU] | Freq: Three times a day (TID) | SUBCUTANEOUS | Status: DC
  Administered 2013-05-25: 3 [IU] via SUBCUTANEOUS
  Administered 2013-05-26: 4 [IU] via SUBCUTANEOUS
  Administered 2013-05-26 (×2): 2 [IU] via SUBCUTANEOUS
  Administered 2013-05-28 (×3): 4 [IU] via SUBCUTANEOUS
  Administered 2013-05-29 – 2013-05-30 (×3): 2 [IU] via SUBCUTANEOUS
  Administered 2013-05-30: 4 [IU] via SUBCUTANEOUS
  Administered 2013-05-31: 2 [IU] via SUBCUTANEOUS
  Administered 2013-05-31 – 2013-06-02 (×4): 4 [IU] via SUBCUTANEOUS

## 2013-05-25 MED ORDER — MIDAZOLAM HCL 2 MG/2ML IJ SOLN
INTRAMUSCULAR | Status: AC
  Filled 2013-05-25: qty 2

## 2013-05-25 MED ORDER — ONDANSETRON HCL 4 MG/2ML IJ SOLN: 4.0000 mg | Freq: Four times a day (QID) | INTRAMUSCULAR | Status: DC | PRN

## 2013-05-25 MED ORDER — SODIUM CHLORIDE 0.9 % IV SOLN: 1.0000 mL/kg/h | INTRAVENOUS | Status: AC

## 2013-05-25 MED ORDER — LIDOCAINE HCL (PF) 1 % IJ SOLN
INTRAMUSCULAR | Status: AC
  Filled 2013-05-25: qty 30

## 2013-05-25 MED ORDER — INSULIN GLARGINE 100 UNIT/ML ~~LOC~~ SOLN
5.0000 [IU] | Freq: Two times a day (BID) | SUBCUTANEOUS | Status: DC
  Administered 2013-05-25: 5 [IU] via SUBCUTANEOUS
  Filled 2013-05-25 (×3): qty 0.05

## 2013-05-25 MED ORDER — HEPARIN (PORCINE) IN NACL 2-0.9 UNIT/ML-% IJ SOLN
INTRAMUSCULAR | Status: AC
  Filled 2013-05-25: qty 1000

## 2013-05-25 MED ORDER — SODIUM CHLORIDE 0.45 % IV SOLN: INTRAVENOUS | Status: DC

## 2013-05-25 MED ORDER — FENTANYL CITRATE 0.05 MG/ML IJ SOLN
INTRAMUSCULAR | Status: AC
  Filled 2013-05-25: qty 2

## 2013-05-25 MED ORDER — INSULIN GLARGINE 100 UNIT/ML ~~LOC~~ SOLN
8.0000 [IU] | Freq: Two times a day (BID) | SUBCUTANEOUS | Status: DC
  Administered 2013-05-25: 8 [IU] via SUBCUTANEOUS
  Filled 2013-05-25 (×2): qty 0.08

## 2013-05-25 MED ORDER — INSULIN GLARGINE 100 UNIT/ML ~~LOC~~ SOLN
8.0000 [IU] | Freq: Two times a day (BID) | SUBCUTANEOUS | Status: DC
  Filled 2013-05-25: qty 0.08

## 2013-05-25 MED ORDER — INSULIN ASPART 100 UNIT/ML ~~LOC~~ SOLN
1.0000 [IU] | Freq: Three times a day (TID) | SUBCUTANEOUS | Status: DC
  Administered 2013-05-25: 4 [IU] via SUBCUTANEOUS
  Administered 2013-05-26: 1 [IU] via SUBCUTANEOUS

## 2013-05-25 MED ORDER — ENOXAPARIN SODIUM 30 MG/0.3ML ~~LOC~~ SOLN
30.0000 mg | SUBCUTANEOUS | Status: DC
  Filled 2013-05-25 (×11): qty 0.3

## 2013-05-25 MED ORDER — ACETAMINOPHEN 325 MG PO TABS: 650.0000 mg | ORAL_TABLET | ORAL | Status: DC | PRN

## 2013-05-25 NOTE — Progress Notes (Signed)
Notified Dr Edilia Bo of patient's Cr today 1.8, ok to procedure will procedure per Dr Edilia Bo

## 2013-05-25 NOTE — Progress Notes (Signed)
Inpatient Diabetes Program Recommendations  AACE/ADA: New Consensus Statement on Inpatient Glycemic Control (2013)  Target Ranges:  Prepandial:   less than 140 mg/dL      Peak postprandial:   less than 180 mg/dL (1-2 hours)      Critically ill patients:  140 - 180 mg/dL   Reason for Visit: hyperglycemia  Inpatient Diabetes Program Recommendations Correction (SSI): Please reorder previous Novolog correction scale along with meal coverage insulin (as previously ordered by Dr. Dierdre Searles).   Note: Spoke with pt about diabetes and home regimen for diabetes control.  Patient reports that she takes Lantus 22 units QAM, Lantus 10 units QHS, and Humalog 0-10 units QID (depending on blood glucose and amount of carbohydrates).  Patient reports that she see Dr. Holly Bodily for diabetes management and her best A1C was noted to be 8.1% in the past.  Patient reports that she did not feel comfortable with taking Novolog correction this morning at 8:00 am because she felt that her blood glucose would drop too much since she was NPO.  She reports that she uses an insulin sensitivity of 1:30 (1 unit of insulin drops 30 mg/dl) and 5:78 carb coverage (1 unit for every 12 grams of carbs).    There is a concerned that currently patient only has basal insulin ordered and she is a type 1 diabetic and will require correction insulin as well as meal coverage.  Patient was noted to be NPO for procedure today and is now back from procedure.  According to the patient she will have surgery tomorrow and will be NPO again after midnight.  Currently, patient is ordered Lantus 8 units BID for inpatient glycemic control.  According to the patient she had a lengthy discussion with Dr. Dierdre Searles and they came up with a correction scale and carb coverage regimen that she was comfortable with.  However, that order was discontinued this morning prior to procedure.  Please reorder Novolog correction and meal coverage as previously ordered and agreed upon by  patient and Dr. Dierdre Searles.  Will continue to follow.  Thanks, Orlando Penner, RN, MSN, CCRN Diabetes Coordinator Inpatient Diabetes Program [phone]

## 2013-05-25 NOTE — Interval H&P Note (Signed)
History and Physical Interval Note:  05/25/2013 10:19 AM  [name]  has presented today for surgery, with the diagnosis of claudication  The various methods of treatment have been discussed with the patient and family. After consideration of risks, benefits and other options for treatment, the patient has consented to  Procedure(s): LOWER EXTREMITY ANGIOGRAM POSS INTERVENTION (N/A) as a surgical intervention .  The patient's history has been reviewed, patient examined, no change in status, stable for surgery.  I have reviewed the patient's chart and labs.  Questions were answered to the patient's satisfaction.     [name] S

## 2013-05-25 NOTE — Progress Notes (Signed)
Dr on Call notified that pt still preferred for it to be started in the lt arm.Informed dr that an order was needed. / Per Dr her attending stated to put a note in the pt wanted it in the lt arm . It was started in the lt arm as a last resort.

## 2013-05-25 NOTE — Op Note (Signed)
PATIENT: [name]  MRN: [id number] DOB: 1946/02/09    DATE OF PROCEDURE: 05/25/2013  INDICATIONS: [name] is a 67 y.o. female who presented with a nonhealing wound of her right foot. She is brought in for diagnostic arteriography. Her creatinine is 1.8. She has a femoral tibial bypass graft on the left.  PROCEDURE:  1. Ultrasound-guided access to the right common femoral artery 2. Right iliac arteriogram 3. Right lower extremity runoff  SURGEON: Di Kindle. Edilia Bo, MD, FACS  ANESTHESIA: local with sedation   EBL: minimal  TECHNIQUE: The patient was brought to the peripheral vascular lab and sedated with 1 mg of Versed and 50 mcg of fentanyl. Both groins were prepped and draped in the usual sterile fashion. After the skin was anesthetized with 1% lidocaine, and under ultrasound guidance, the right common femoral artery was cannulated with a micropuncture needle and a micropuncture sheath was introduced over a wire. This was exchanged for a 5 [place] sheath over a [employer] wire. A retrograde right iliac arteriogram was obtained and a LAO projection. Next right lower extremity runoff films were obtained. At the completion of the procedure the sheath was removed and pressure held for hemostasis. No immediate complications were noted.  FINDINGS:  1. The right common iliac and external iliac arteries are widely patent. There is moderate disease in the right hypogastric artery. 2. The right common femoral and deep femoral artery are patent. The proximal superficial femoral artery is patent. There is a long segment of the superficial femoral artery in the mid thigh with diffuse disease with stenoses up to 80%. 3. The above-the-knee popliteal artery is patent area the below-knee popliteal artery is patent. The right posterior tibial artery is occluded. There is diffuse disease of the peroneal artery on the right although it is patent. The anterior tibial artery is patent. There is  poor visualization of the dorsalis pedis in the foot.  Waverly Ferrari, MD, FACS [facility]  DATE OF DICTATION:   05/25/2013

## 2013-05-25 NOTE — H&P (View-Only) (Signed)
[facility] [place]      Consult Note  Patient name: [name] MRN: [id number] DOB: 10/25/1945 Sex: female  Consulting Physician:  Family Medicine  Reason for Consult: No chief complaint on file.   HISTORY OF PRESENT ILLNESS: This is a 66-year-old female patient of Dr. Early who is  status post left femoral to anterior tibial artery bypass with translocated non-reversed vein in July 2010.  She also underwent transmetatarsal amputation which healed.  She was admitted to the hospital on 05/23/2013 with a worsening right foot wound that has been present for 3 months.  She thinks that it was the result of poorly fitting shoes.  Approximately one week ago, it became worse and began draining foul smelling fluid.  It became red and painful.  She reports subjective fevers.  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  ABI's in 12/2012 were 1.2 on the left and 0.82 on the right with monophasic waveforms bilaterally.  She is a poorly controlled type 1 diabetic.  She has CAD and is s/p CABG in 1993.  She takes a full strength ASA and a betablocker.  She is medically managed for her hypercholesterolemia with a statin.  She is a non-somoker.  Past Medical History  Diagnosis Date  . HTN (hypertension)   . Hypothyroidism   . History of recurrent UTIs   . CAD (coronary artery disease)     s/p CABG x3 in 1993; last cath in 2010  . Scleroderma   . Bilateral carpal tunnel syndrome 1970s  . 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 Followed by Dr. Early  . Claudication   . Aortic stenosis     0.8cm2 on echo in 10/2011  . Hyperlipidemia     on Lipitor  . Cat bite 2009    with MRSA; required I & D  . Heart murmur   . Blood transfusion     no reaction from transfusion; "when I had heart surgery" (08/28/2012)  . GERD (gastroesophageal reflux disease)   . CHF  (congestive heart failure) Aug. 2012    Echo 10/2011: Mild LVH, EF 30%, apex akinetic, septal HK, grade 2 diastolic dysfunction, or functionally bicuspid aortic valve, mild aortic stenosis by gradient, severe by calculated AVA-suspect A. Aortic stenosis probably moderate, trivial MR, mild LAE, mild RAE.   . Cellulitis   . Anemia   . B12 deficiency anemia     B 12 injection "monthly since 05/2012" (08/28/2012)  . Iron deficiency anemia 06/13/2012    "iron infusion" (08/28/2012  . Anginal pain     "history; not currently" (08/28/2012)  . Myocardial infarction 1986    "silent"  . History of bronchitis     "I've had it twice in my life" (08/28/2012)  . Exertional dyspnea     "because of the heart failure" (08/28/2012)  . Type 1 diabetes 10/1949  . Stomach ulcer 1981?    "on Tagament for ~ 1 yr" (08/28/2012)  . Seizure     ? seizure like event in 2010; workup included stress testing which led to repeat cath.   . Arthritis     "hands, hips, all over" (08/28/2012  . Osteoarthritis of both feet   . Breast cancer     left  . Cellulitis June 2013, Nov. 2013 and Feb. 14, 2014    Left leg      Past Surgical History  Procedure Laterality Date  . Carpal tunnel release  ~ 1970's    bilaterally  . Mastectomy  11/1982    Left Breast  . Vitrectomy  1990's-2004    "both eyes; I've had total of 4"  . Tubal ligation  1984  . Tonsillectomy  1952    "?adenoids"   . Appendectomy  1991  . Femoral bypass      left leg "related to transmetatarsal amputation" (08/28/2012)  . Breast biopsy with sentinel lymph node biopsy and needle localization  1984  . Coronary artery bypass graft  1992    LIMA to LAD, SVG to distal LCX & SVG to Marginal  . Cardiac catheterization  2010    LIMA to LAD patent yet with occluded distal LAD after graft insertion & retrograde is occluded. 3-4 septal perforators arise &are patent. SVG to a large marginal patent; SVG presumably to the distal dominant LCX is occluded,  moderately high grade stenosis of the AV circumflex, but with distal stenoses involving a severely diseased marginal branch not amenable  to PCI  nor is inferior branch   . Cardiac catheterization  10/2011    "unsuccessful attempt of stenting" (08/28/2012)  . Cardiac catheterization  1992; 09/2011  . Breast biopsy    . Trigger finger release  1980's    right thumb  . Incision and drainage of wound  ~ 1967    "infection in left foot" (08/28/2012)  . Incision and drainage of wound  2009    "3 ORs on my right hand after cat bite" (08/28/2012)  . 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 Cataract extraction w/ intraocular lens  implant, bilateral  1990's    History   Social History  . Marital Status: Single    Spouse Name: N/A    Number of Children: N/A  . Years of Education: N/A   Occupational History  . Administrative Assistant    Social History Main Topics  . Smoking status: Never Smoker   . Smokeless tobacco: Never Used  . Alcohol Use: No  . Drug Use: No  . Sexually Active: Not Currently    Birth Control/ Protection: Post-menopausal   Other Topics Concern  . Not on file   Social History Narrative  . No narrative on file    Family History  Problem Relation Age of Onset  . Diabetes Mother     Allergies as of 05/22/2013 - Review Complete 05/22/2013  Allergen Reaction Noted  . Adhesive (tape) Other (See Comments) 06/11/2011  . Bactrim (sulfamethoxazole w-trimethoprim)  09/05/2012  . Cephalexin Swelling and Rash 02/03/2009  . Ciprofloxacin Swelling and Rash 02/03/2009    No current facility-administered medications on file prior to encounter.   Current Outpatient Prescriptions on File Prior to Encounter  Medication Sig Dispense Refill  . aspirin EC 325 MG EC tablet Take 325 mg by mouth daily.       . atorvastatin (LIPITOR) 40 MG tablet Take 40 mg by mouth at bedtime.      . brimonidine (ALPHAGAN) 0.2 % ophthalmic solution Place 1 drop  into both eyes 2 (two) times daily.        . cyanocobalamin (,VITAMIN B-12,) 1000 MCG/ML injection Inject 1,000 mcg into the muscle every 30 (thirty) days.      . docusate sodium (COLACE) 50 MG capsule Take by mouth 2 (two) times daily.      . ezetimibe (ZETIA) 10 MG tablet   Take 10 mg by mouth at bedtime.      . Ferrous Sulfate (SLOW RELEASE IRON) 50 MG TBCR Take 50 mg by mouth daily.       . insulin glargine (LANTUS) 100 UNIT/ML injection Inject 10-22 Units into the skin 2 (two) times daily. Take up to 22 units subcutaneously each morning and 10 units each evening.      . insulin lispro (HUMALOG PEN) 100 UNIT/ML injection Inject 0-10 Units into the skin 4 (four) times daily. Per sliding scale DO NOT GIVE WITHOUT CONSULTING PATIENT ON HER SLIDING SCALE      . isosorbide mononitrate (IMDUR) 30 MG 24 hr tablet Take 30 mg by mouth at bedtime.       . latanoprost (XALATAN) 0.005 % ophthalmic solution Place 1 drop into both eyes at bedtime.        . levothyroxine (SYNTHROID, LEVOTHROID) 125 MCG tablet Take 125 mcg by mouth daily.       . metoprolol succinate (TOPROL-XL) 25 MG 24 hr tablet Take 25 mg by mouth daily.         REVIEW OF SYSTEMS: See HPI, otherwise all systems are negative  PHYSICAL EXAMINATION: General: The patient appears their stated age.  Vital signs are BP 142/60  Pulse 70  Temp(Src) 99 F (37.2 C) (Oral)  Resp 16  Ht 5' 3" (1.6 m)  Wt 168 lb (76.204 kg)  BMI 29.77 kg/m2  SpO2 98% Pulmonary: Respirations are non-labored HEENT:  No gross abnormalities Abdomen: Soft and non-tender  Musculoskeletal: There are no major deformities.   Neurologic: No focal weakness or paresthesias are detected, Skin: Necrotic ulcer at the fifth metatarsal base with purulent drainage. Psychiatric: The patient has normal affect. Cardiovascular: There is a regular rate and rhythm without significant murmur appreciated. Palpable left tibial bypass. Palpable left femoral pulse and right femoral  pulse  Diagnostic Studies: Right Foot MRI:  Osteo of 5th toe with fluid     Assessment:  Right foot osteo in the setting of PVD Plan: This is a limb-threatening situation.  The patient will need formal angiography to better define her arterial anatomy.  She will need revascularization in addition to toe amputation.  I will schedule her angiogram for this Monday.  Continue IV antibiotics and leg elevation.     V. Wells Brabham IV, M.D. [facility] [place] Office: [phone] Pager:  [phone] 

## 2013-05-25 NOTE — Progress Notes (Signed)
ANTIBIOTIC CONSULT NOTE - Follow-up  Pharmacy Consult for vancomycin Indication: r/o osteomyelitis  Allergies  Allergen Reactions  . Adhesive (Tape) Other (See Comments)    "old [employer] adhesive tape; takes my skin off; can use paper tape"  . Bactrim (Sulfamethoxazole W-Trimethoprim)     Hyperkalemia and Increased creatinine  . Cephalexin Swelling and Rash  . Ciprofloxacin Swelling and Rash    Patient Measurements: Height: 5\' 3"  (160 cm) Weight: 167 lb 12.3 oz (76.1 kg) IBW/kg (Calculated) : 52.4 Total Body Weight: 75.1 kg  Vital Signs: Temp: 98.6 F (37 C) (08/11 0455) Temp src: Oral (08/11 0455) BP: 125/60 mmHg (08/11 0455) Pulse Rate: 75 (08/11 0455) Intake/Output from previous day: 08/10 0701 - 08/11 0700 In: 420 [P.O.:420] Out: 800 [Urine:800] Intake/Output from this shift:    Labs:  Recent Labs  05/23/13 0401 05/24/13 0450 05/25/13 0545  WBC 9.4 12.0* 8.6  HGB 11.3* 11.8* 11.2*  PLT 249 232 202  CREATININE 1.38* 1.54* 1.80*   Estimated Creatinine Clearance: 29.6 ml/min (by C-G formula based on Cr of 1.8). No results found for this basename: VANCOTROUGH, VANCOPEAK, VANCORANDOM, GENTTROUGH, GENTPEAK, GENTRANDOM, TOBRATROUGH, TOBRAPEAK, TOBRARND, AMIKACINPEAK, AMIKACINTROU, AMIKACIN,  in the last 72 hours   Microbiology: Recent Results (from the past 720 hour(s))  CULTURE, BLOOD (ROUTINE X 2)     Status: None   Collection Time    05/22/13 11:59 PM      Result Value Range Status   Specimen Description BLOOD RIGHT ARM   Final   Special Requests BOTTLES DRAWN AEROBIC ONLY [facility]   Final   Culture  Setup Time     Final   Value: 05/23/2013 11:17     Performed at [facility]   Culture     Final   Value:        BLOOD CULTURE RECEIVED NO GROWTH TO DATE CULTURE WILL BE HELD FOR 5 DAYS BEFORE ISSUING A FINAL NEGATIVE REPORT     Performed at [facility]   Report Status PENDING   Incomplete  CULTURE, BLOOD (ROUTINE X 2)     Status: None   Collection Time    05/23/13 12:15 AM      Result Value Range Status   Specimen Description BLOOD LEFT HAND   Final   Special Requests BOTTLES DRAWN AEROBIC ONLY [facility]   Final   Culture  Setup Time     Final   Value: 05/23/2013 11:16     Performed at [facility]   Culture     Final   Value:        BLOOD CULTURE RECEIVED NO GROWTH TO DATE CULTURE WILL BE HELD FOR 5 DAYS BEFORE ISSUING A FINAL NEGATIVE REPORT     Performed at [facility]   Report Status PENDING   Incomplete  SURGICAL PCR SCREEN     Status: Abnormal   Collection Time    05/23/13  6:10 AM      Result Value Range Status   MRSA, PCR POSITIVE (*) NEGATIVE Final   Staphylococcus aureus POSITIVE (*) NEGATIVE Final   Comment:            The Xpert SA Assay (FDA     approved for NASAL specimens     in patients over 68 years of age),     is one component of     a comprehensive surveillance     program.  Test performance has     been validated by [employer]  [facility] for patients greater     than or equal to 1 year old.     It is not intended     to diagnose infection nor to     guide or monitor treatment.    Assessment: 67 yof started on vancomycin for osteomyelitis. Pt is afebrile and WBC is WNL. Of note, pts Scr is trending up but dose remains appropriate.   8/8 Vanc>> 8/8 Zosyn>>8/9  8/8 Bldx2>>NGTD  Goal of Therapy:  Vancomycin trough level 15-20 mcg/ml  Plan:  1. Continue vanc 1gm IV Q24H 2. F/u renal fxn, C&S, clinical status and trough at SS 3. If Scr continues to increase, would likely hold vanc and check a trough tomorrow  Lysle Pearl, PharmD, BCPS Pager # [phone] 05/25/2013 10:04 AM

## 2013-05-25 NOTE — Progress Notes (Signed)
UR completed 

## 2013-05-25 NOTE — Progress Notes (Signed)
Patient ID: [name], female   DOB: 05-25-1946, 67 y.o.   MRN: [id number] Reviewed the patient's arteriogram I discussed at length with the patient. She is well known to me from a prior left femoral to anterior tibial bypass for limb salvage. She initially had amputation of the left and then several additional dilatations and finally required a transmetatarsal dictation which healed. This was in 2010.  She now presents with a infection involving her right fifth metatarsal head.  Arteriogram today shows widely patent iliac system. Her proximal superficial femoral was R. is widely patent. She has diffuse severe stenosis to a segment of her distal superficial femoral artery with no complete occlusion. There is collateralization around this. Her above-knee popliteal artery is widely patent and she does have anterior tibial artery runoff in continuity to the ankle.  And long discussion with the patient explaining options. I think that she does have to have a fifth toe ray amputation regardless of the other options. I feel that she has very slim chance of healing this primarily due to her SFA disease. I explained the option of angioplasty of her SFA or bypass around this. She has had vein harvested from this leg for coronary bypass grafting in the past. I would certainly recommend initial attempt at revascularization with angioplasty.  I reviewed the films and discuss this with Dr. Myra Gianotti who will proceed with distal SFA angioplasty and on 813 at 8:30 in the morning and will then undergo toe amputation by myself to follow in the operating room. She does understand this is certainly a limb threatening problem again on her right side

## 2013-05-25 NOTE — Progress Notes (Signed)
Inpatient Diabetes Program Recommendations  AACE/ADA: New Consensus Statement on Inpatient Glycemic Control (2013)  Target Ranges:  Prepandial:   less than 140 mg/dL      Peak postprandial:   less than 180 mg/dL (1-2 hours)      Critically ill patients:  140 - 180 mg/dL   Spoke with [name] Quail, MS regarding restarting her insulin regimen for today (following her procedure this am) according to what the patient and Dr Dierdre Searles Agreed upon. 4 unit mc tidwc Correction scale as ordered Lantus 8 units bid. Entered these orders per verbal order per Jesusita Oka Green//Na Li   Thank you, Lenor Coffin, RN, CNS, Diabetes Coordinator [phone])

## 2013-05-25 NOTE — Progress Notes (Signed)
Patient refusing IV in right hand after right brachial IV infiltrated. She wishes to have the new IV started in her left arm, even though she had a left mastectomy 20 years ago. Risks of using that arm were explained to her by nurse, including lymphedema and poor venous return. Per patient, IVs have been started on the left before with no issue, and no lymph nodes were removed during her mastectomy. She needs to have an IV for antibiotics, so we will start on the left.  Vivi Barrack MD [phone]

## 2013-05-25 NOTE — Progress Notes (Signed)
Patient ID: [name], female   DOB: 07-11-1946, 67 y.o.   MRN: [id number]   Subjective: Patient concerned with higher trending CBGs. Otherwise no complaints. Had one episode of SOB that she thinks was anxiety related. It resolved when she sat up and took some deep breaths. Denies orthopnea, PND, chest pain. No complaints with IV in left arm, wanted to avoid right handed IV because it is inconvenient to type, as she is right handed.   Objective: Vital signs in last 24 hours: Filed Vitals:   05/24/13 1354 05/24/13 2136 05/25/13 0455 05/25/13 1201  BP: 102/38 134/61 125/60 124/50  Pulse: 73 73 75 73  Temp: 98 F (36.7 C) 98.8 F (37.1 C) 98.6 F (37 C) 98.9 F (37.2 C)  TempSrc: Oral Oral Oral Oral  Resp: 16 18 16 20   Height:      Weight:      SpO2: 100% 100% 100% 93%     General:NAD  Lungs : CTA, no crackles, wheezes  Heart: RRR, murmur II/VI unchanged Abd: soft, BS x 4  LE; B/L LE erythema, swelling, tenderness, and slight warmth to touch. Erythema improved today from last exam. Right foot bandaged with clean dry bandage.   Lab Results: Basic Metabolic Panel:  Recent Labs Lab 05/23/13 0015  05/24/13 0450 05/25/13 0545  NA 135  < > 135 129*  K 4.4  < > 4.0 4.2  CL 100  < > 99 95*  CO2 26  < > 24 23  GLUCOSE 111*  < > 185* 405*  BUN 23  < > 28* 42*  CREATININE 1.36*  < > 1.54* 1.80*  CALCIUM 9.5  < > 8.9 8.7  MG 2.1  --   --   --   < > = values in this interval not displayed.  8/11 Na+ corrected for glucose 134  CBC:  Recent Labs Lab 05/23/13 0015  05/24/13 0450 05/25/13 0545  WBC 11.4*  < > 12.0* 8.6  NEUTROABS 8.9*  --   --   --   HGB 12.6  < > 11.8* 11.2*  HCT 36.8  < > 34.8* 32.4*  MCV 92.0  < > 91.8 90.5  PLT 301  < > 232 202  < > = values in this interval not displayed. CBG:  Recent Labs Lab 05/24/13 1600 05/24/13 1932 05/24/13 2342 05/25/13 0428 05/25/13 0802 05/25/13 1227  GLUCAP 467* 460* 431* 373* 351* 339*   Hemoglobin  A1C:  Recent Labs Lab 05/22/13 1510  HGBA1C 9.8   Micro Results: Recent Results (from the past 240 hour(s))  CULTURE, BLOOD (ROUTINE X 2)     Status: None   Collection Time    05/22/13 11:59 PM      Result Value Range Status   Specimen Description BLOOD RIGHT ARM   Final   Special Requests BOTTLES DRAWN AEROBIC ONLY [facility]   Final   Culture  Setup Time     Final   Value: 05/23/2013 11:17     Performed at [facility]   Culture     Final   Value:        BLOOD CULTURE RECEIVED NO GROWTH TO DATE CULTURE WILL BE HELD FOR 5 DAYS BEFORE ISSUING A FINAL NEGATIVE REPORT     Performed at [facility]   Report Status PENDING   Incomplete  CULTURE, BLOOD (ROUTINE X 2)     Status: None   Collection Time    05/23/13 12:15 AM  Result Value Range Status   Specimen Description BLOOD LEFT HAND   Final   Special Requests BOTTLES DRAWN AEROBIC ONLY [facility]   Final   Culture  Setup Time     Final   Value: 05/23/2013 11:16     Performed at [facility]   Culture     Final   Value:        BLOOD CULTURE RECEIVED NO GROWTH TO DATE CULTURE WILL BE HELD FOR 5 DAYS BEFORE ISSUING A FINAL NEGATIVE REPORT     Performed at [facility]   Report Status PENDING   Incomplete  SURGICAL PCR SCREEN     Status: Abnormal   Collection Time    05/23/13  6:10 AM      Result Value Range Status   MRSA, PCR POSITIVE (*) NEGATIVE Final   Staphylococcus aureus POSITIVE (*) NEGATIVE Final   Comment:            The Xpert SA Assay (FDA     approved for NASAL specimens     in patients over 67 years of age),     is one component of     a comprehensive surveillance     program.  Test performance has     been validated by [employer] for patients greater     than or equal to 24 year old.     It is not intended     to diagnose infection nor to     guide or monitor treatment.   Studies/Results: Mr Foot Right W Wo Contrast  05/23/2013   *RADIOLOGY REPORT*  Clinical Data: Right foot  wound, query soft tissue infection and / or osteomyelitis.  Foot erythema and pain.  MRI OF THE RIGHT FOREFOOT WITHOUT AND WITH CONTRAST  Technique:  Multiplanar, multisequence MR imaging was performed both before and after administration of intravenous contrast.  Contrast: 15mL MULTIHANCE GADOBENATE DIMEGLUMINE 529 MG/ML IV SOLN  Comparison: 04/04/2012  Findings: Despite efforts by the patient and technologist, motion artifact is present on some series of today's examination and could not be totally eliminated.  This reduces diagnostic sensitivity and specificity.  Abnormal edema and enhancement compatible with osteomyelitis involving the head and distal metaphysis of the fifth metatarsal and the adjacent proximal phalanx of the fifth toe.  Adjacent 1.8 by 0.9 x 2.1 cm fluid collection may be draining to the skin surface on image 17 of series 12, and extends from the vicinity of the fifth MCP joint.  No additional significant abnormal osseous edema in the forefoot. No overt bony destruction of the articular margins.  There is diffuse subcutaneous edema tracking in the forefoot which could reflect cellulitis.  Low-level edema tracts along the plantar foot musculature.  IMPRESSION:  1.  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 Probable septic fifth MTP joint, with a large adjacent fluid collection probably draining to the scan. 2.  Diffuse subcutaneous edema.  Although diffuse cellulitis in the foot is not excluded, the associated subcutaneous infiltrative enhancement is most prominent in the vicinity of the osteomyelitis and draining fluid collection.   Original Report Authenticated By: Gaylyn Rong, M.D.   Medications: I have reviewed the patient's current medications. Scheduled Meds:  aspirin EC  325 mg Oral Daily   atorvastatin  40 mg Oral QHS   brimonidine  1 drop Both Eyes BID   [START ON 05/26/2013] enoxaparin (LOVENOX)  injection  30 mg Subcutaneous Q24H   ezetimibe  10 mg Oral QHS   furosemide  40 mg Oral Custom   furosemide  60 mg Oral Custom   insulin glargine  8 Units Subcutaneous BID   isosorbide mononitrate  30 mg Oral QHS   latanoprost  1 drop Both Eyes QHS   levothyroxine  125 mcg Oral QAC breakfast   metoprolol succinate  25 mg Oral Daily   vancomycin  1,000 mg Intravenous Q24H   Continuous Infusions:  sodium chloride     sodium chloride     PRN Meds:.acetaminophen, morphine injection, ondansetron (ZOFRAN) IV, ondansetron Assessment/Plan: [name] is a 67 yo with poorly controlled Type I DM, CAD s/p CABG, PVD s/p L fem to anterior tibial artery bypass, L forefoot amputation in 2011, and CKD who presents from clinic with right foot ulcer evaluation. MRI indicated OM with the impression as follows.   # OM and cellulitis associated with Diabetic right midfoot ulcer -   Pt's vitals are stable, afebrile, does not appear toxic.   - Appreciate vascular surgeon input, plan to do angiogram and make decision about surgery today - NPO until procedures complete - IV vancomycin continued  - Follow up blood cultures  - Tylenol, morphine prn pain  - Zofran prn nausea   # uncontrolled Diabetes type 1 - HgA1C 9.8. CBGs 350-480s. Patient NPO.  -Once pt is eating, will continue plan from 8/10 of Lantus 5 BID, Novolog 4 units TID with meals with detailed instructions, SSI -While NPO, will increase Lantus to 8 BID and hold Novolog.  #Stage III CKD - Cr 1.8 up from 1.36 (baseline)  - Avoid nephrotoxic agents, including ibuprofen -Will monitor   #CHF - Stable.  - Continue home lasix, imdur, metoprolol -Seen by Cards over weekend, appreciate their input, listed patient as moderate risk for surgery -f/u ECHO and EKG ordered by cards   #PVD - Stable. Angiogram 8/11, awaiting Dr Bosie Helper recs   #HLD - continue home Lipitor, ezetimibe   #Hypothyroidism - continue home synthroid   #DVT PPX -  subq heparin     Dispo: Disposition is deferred at this time, awaiting improvement of current medical problems.  Anticipated discharge in approximately 1-2 day(s).   The patient does have a current PCP [name], MD) and does need an [facility] hospital follow-up appointment after discharge.  The patient does not have transportation limitations that hinder transportation to clinic appointments.  .Services Needed at time of discharge: Y = Yes, Blank = No PT: Y  OT: Y  RN:   Equipment:   Other:     LOS: 3 days   [No Provider Link Found] 05/25/2013, 2:13 PM

## 2013-05-25 NOTE — Progress Notes (Signed)
  Date: 05/25/2013  Patient name: [name]  Medical record number: [id number]  Date of birth: 1946-03-23   This patient has been seen and the plan of care was discussed with the house staff. Please see their note for complete details. I concur with their findings with the following additions/corrections:  This morning she is concerned about her BG. She states she will only allow her BG to drop to a low of 130. She does not want correction insulin that is short acting. She states she will allow an increase in her Lantus to 8 units twice a day to improve fasting levels as she is NPO (this is a decrease from her normal dose while she is eating).  She already understands the consequences of uncontrolled DM (such as amputations). She is going for LE angiogram today.  She has right foot osteo in setting of PVD.  Continue Vancomycin. ID is following.  Filed Vitals:   05/25/13 1201  BP: 124/50  Pulse: 73  Temp: 98.9 F (37.2 C)  Resp: 20   GEN: AAOx3, NAD CV: S1S2, no m/r/g, RRR PULM: CTA bilat LE: Left midfoot amputation c/d. Right foot with persistent lateral ulceration w/drainage, dressed.   A/P 67 yr old woman with uncontrolled DM, PVD, with acute osteomyelitis. -Plans for LE angiogram today.  She has right foot osteo in setting of PVD.  Continue Vancomycin. ID is following. -She is very peculiar about her diabetic control. She understands the risks of not improving her diabetic control.    [name], DO 05/25/2013, 2:29 PM

## 2013-05-25 NOTE — Progress Notes (Signed)
Pt refuses to have Iv started in the right arm.  She is made aware by the Iv team of starting an IV in the mastectomy arm.  Per pt she was told by Dr Corinda Gubler that it was ok to have Iv's in the left arm and they had been started there before with no problem. Tct Dr on call to be made of the above.

## 2013-05-26 ENCOUNTER — Ambulatory Visit: Payer: Medicare Other | Admitting: Vascular Surgery

## 2013-05-26 DIAGNOSIS — N179 Acute kidney failure, unspecified: Secondary | ICD-10-CM

## 2013-05-26 DIAGNOSIS — E1159 Type 2 diabetes mellitus with other circulatory complications: Secondary | ICD-10-CM

## 2013-05-26 LAB — CBC
Hemoglobin: 11 g/dL — ABNORMAL LOW (ref 12.0–15.0)
MCHC: 34.7 g/dL (ref 30.0–36.0)
Platelets: 206 10*3/uL (ref 150–400)
RDW: 14.2 % (ref 11.5–15.5)

## 2013-05-26 LAB — BASIC METABOLIC PANEL
GFR calc Af Amer: 31 mL/min — ABNORMAL LOW (ref >90)
GFR calc non Af Amer: 27 mL/min — ABNORMAL LOW (ref >90)
Potassium: 4.7 mEq/L (ref 3.5–5.1)
Sodium: 131 mEq/L — ABNORMAL LOW (ref 135–145)

## 2013-05-26 LAB — GLUCOSE, CAPILLARY
Glucose-Capillary: 183 mg/dL — ABNORMAL HIGH (ref 70–99)
Glucose-Capillary: 194 mg/dL — ABNORMAL HIGH (ref 70–99)
Glucose-Capillary: 168 mg/dL — ABNORMAL HIGH (ref 70–99)

## 2013-05-26 MED ORDER — MUPIROCIN 2 % EX OINT
1.0000 "application " | TOPICAL_OINTMENT | Freq: Two times a day (BID) | CUTANEOUS | Status: AC
  Administered 2013-05-26 – 2013-05-31 (×9): 1 via NASAL
  Filled 2013-05-26: qty 22

## 2013-05-26 MED ORDER — INSULIN GLARGINE 100 UNIT/ML ~~LOC~~ SOLN
6.0000 [IU] | Freq: Two times a day (BID) | SUBCUTANEOUS | Status: DC
  Administered 2013-05-26 – 2013-05-29 (×7): 6 [IU] via SUBCUTANEOUS
  Filled 2013-05-26 (×9): qty 0.06

## 2013-05-26 MED ORDER — SODIUM CHLORIDE 0.9 % IV SOLN: INTRAVENOUS | Status: DC

## 2013-05-26 MED ORDER — CHLORHEXIDINE GLUCONATE CLOTH 2 % EX PADS
6.0000 | MEDICATED_PAD | Freq: Every day | CUTANEOUS | Status: AC
  Administered 2013-05-28 – 2013-05-31 (×4): 6 via TOPICAL

## 2013-05-26 MED ORDER — INSULIN ASPART 100 UNIT/ML ~~LOC~~ SOLN
0.0000 [IU] | Freq: Three times a day (TID) | SUBCUTANEOUS | Status: DC
  Administered 2013-05-26: 1 [IU] via SUBCUTANEOUS
  Administered 2013-05-30: 2 [IU] via SUBCUTANEOUS
  Administered 2013-06-02: 1 [IU] via SUBCUTANEOUS

## 2013-05-26 MED ORDER — SODIUM CHLORIDE 0.9 % IV BOLUS (SEPSIS): 500.0000 mL | Freq: Once | INTRAVENOUS | Status: DC

## 2013-05-26 MED ORDER — SODIUM CHLORIDE 0.9 % IV SOLN
INTRAVENOUS | Status: AC
  Administered 2013-05-26: 19:00:00 via INTRAVENOUS

## 2013-05-26 MED ORDER — SODIUM CHLORIDE 0.9 % IV SOLN
INTRAVENOUS | Status: DC
  Administered 2013-05-26: 11:00:00 via INTRAVENOUS

## 2013-05-26 MED ORDER — DOCUSATE SODIUM 50 MG PO CAPS
50.0000 mg | ORAL_CAPSULE | Freq: Two times a day (BID) | ORAL | Status: DC | PRN
  Administered 2013-05-26 – 2013-05-29 (×3): 50 mg via ORAL
  Filled 2013-05-26 (×4): qty 1

## 2013-05-26 NOTE — Progress Notes (Signed)
INITIAL NUTRITION ASSESSMENT  DOCUMENTATION CODES Per approved criteria  -Not Applicable   INTERVENTION:  No nutrition intervention at this time ---> patient declined RD to follow for nutrition care plan  NUTRITION DIAGNOSIS: Increased nutrient needs related to wound healing as evidenced by estimated nutrition needs  Goal: Pt to meet >/= 90% of their estimated nutrition needs   Monitor:  PO intake, weight, labs, I/O's  Reason for Assessment: poor PO intake   67 y.o. female  Admitting Dx: Diabetic midfoot ulcer in type 1 diabetes mellitus  ASSESSMENT: Patient with PMH of Type I DM, CAD s/p CABG, PVD who presented with a nonhealing wound of her R foot.  Patient s/p procedures 8/11: 1. Ultrasound-guided access to R common femoral artery  2. Right iliac arteriogram  3. Right lower extremity runoff  Patient reports her appetite is fair; PO intake variable at 0-75% per flowsheet records; weight has been stable; plan is for toe amputation tomorrow; declined addition of nutrition supplements.  Height: Ht Readings from Last 1 Encounters:  05/22/13 5\' 3"  (1.6 m)    Weight: Wt Readings from Last 1 Encounters:  05/26/13 167 lb 5.3 oz (75.9 kg)    Ideal Body Weight: 115 lb  % Ideal Body Weight: 145%  Wt Readings from Last 10 Encounters:  05/26/13 167 lb 5.3 oz (75.9 kg)  05/26/13 167 lb 5.3 oz (75.9 kg)  05/22/13 165 lb 8 oz (75.07 kg)  12/05/12 164 lb 1.6 oz (74.435 kg)  11/30/12 162 lb 7.7 oz (73.7 kg)  11/26/12 156 lb 11.2 oz (71.079 kg)  11/15/12 157 lb (71.215 kg)  10/27/12 156 lb (70.761 kg)  09/05/12 160 lb (72.576 kg)  09/04/12 160 lb 12.8 oz (72.938 kg)    Usual Body Weight: 164 lb  % Usual Body Weight: 102%  BMI:  Body mass index is 29.65 kg/(m^2).  Estimated Nutritional Needs: Kcal: 1800-2000 Protein: 90-100 gm Fluid: 1.8-2.0 L  Skin: diabetic R midfoot ulcer  Diet Order: Carb Control  EDUCATION NEEDS: -No education needs identified at this  time   Intake/Output Summary (Last 24 hours) at 05/26/13 1356 Last data filed at 05/25/13 2300  Gross per 24 hour  Intake    200 ml  Output    550 ml  Net   -350 ml    Labs:   Recent Labs Lab 05/23/13 0015  05/24/13 0450 05/25/13 0545 05/25/13 1540 05/26/13 0440  NA 135  < > 135 129*  --  131*  K 4.4  < > 4.0 4.2  --  4.7  CL 100  < > 99 95*  --  98  CO2 26  < > 24 23  --  23  BUN 23  < > 28* 42*  --  46*  CREATININE 1.36*  < > 1.54* 1.80* 1.72* 1.85*  CALCIUM 9.5  < > 8.9 8.7  --  8.8  MG 2.1  --   --   --   --   --   GLUCOSE 111*  < > 185* 405*  --  201*  < > = values in this interval not displayed.  CBG (last 3)   Recent Labs  05/26/13 0430 05/26/13 0758 05/26/13 1235  GLUCAP 183* 194* 168*    Scheduled Meds: . atorvastatin  40 mg Oral QHS  . brimonidine  1 drop Both Eyes BID  . enoxaparin (LOVENOX) injection  30 mg Subcutaneous Q24H  . ezetimibe  10 mg Oral QHS  . insulin aspart  0-5 Units Subcutaneous TID WC  . insulin aspart  4 Units Subcutaneous TID WC  . insulin glargine  6 Units Subcutaneous BID  . isosorbide mononitrate  30 mg Oral QHS  . latanoprost  1 drop Both Eyes QHS  . levothyroxine  125 mcg Oral QAC breakfast  . metoprolol succinate  25 mg Oral Daily    Continuous Infusions: . sodium chloride      Past Medical History  Diagnosis Date  . HTN (hypertension)   . Hypothyroidism   . History of recurrent UTIs   . CAD (coronary artery disease)     s/p CABG x3 in 1993; last cath in 2010  . Scleroderma   . Bilateral carpal tunnel syndrome 1970s  . 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 Followed by [name]  . Claudication   . Aortic stenosis     0.8cm2 on echo in 10/2011  . Hyperlipidemia     on Lipitor  . Cat bite 2009    with MRSA; required I & D  . Heart murmur   . Blood transfusion     no reaction from transfusion; "when I had heart surgery" (08/28/2012)  .  GERD (gastroesophageal reflux disease)   . CHF (congestive heart failure) Aug. 2012    Echo 10/2011: Mild LVH, EF 30%, apex akinetic, septal HK, grade 2 diastolic dysfunction, or functionally bicuspid aortic valve, mild aortic stenosis by gradient, severe by calculated AVA-suspect A. Aortic stenosis probably moderate, trivial MR, mild LAE, mild RAE.   [name] Cellulitis   . Anemia   . B12 deficiency anemia     B 12 injection "monthly since 05/2012" (08/28/2012)  . Iron deficiency anemia 06/13/2012    "iron infusion" (08/28/2012  . Anginal pain     "history; not currently" (08/28/2012)  . Myocardial infarction 1986    "silent"  . History of bronchitis     "I've had it twice in my life" (08/28/2012)  . Exertional dyspnea     "because of the heart failure" (08/28/2012)  . Type 1 diabetes 10/1949  . Stomach ulcer 1981?    "on Tagament for ~ 1 yr" (08/28/2012)  . Seizure     ? seizure like event in 2010; workup included stress testing which led to repeat cath.   . Arthritis     "hands, hips, all over" (08/28/2012  . Osteoarthritis of both feet   . Breast cancer     left  . Cellulitis June 2013, Nov. 2013 and Feb. 14, 2014    Left leg    Past Surgical History  Procedure Laterality Date  . Carpal tunnel release  ~ 1970's    bilaterally  . Mastectomy  11/1982    Left Breast  . Vitrectomy  1990's-2004    "both eyes; I've had total of 4"  . Tubal ligation  1984  . Tonsillectomy  1952    "?adenoids"   . Appendectomy  1991  . Femoral bypass      left leg "related to transmetatarsal amputation" (08/28/2012)  . Breast biopsy with sentinel lymph node biopsy and needle localization  1984  . Coronary artery bypass graft  1992    LIMA to LAD, SVG to distal LCX & SVG to Marginal  . Cardiac catheterization  2010    LIMA to LAD patent yet with occluded distal LAD after graft insertion & retrograde is occluded. 3-4 septal perforators arise &are  patent. SVG to a large marginal patent; SVG presumably  to the distal dominant LCX is occluded, moderately high grade stenosis of the AV circumflex, but with distal stenoses involving a severely diseased marginal branch not amenable  to PCI  nor is inferior branch   . Cardiac catheterization  10/2011    "unsuccessful attempt of stenting" (08/28/2012)  . Cardiac catheterization  1992; 09/2011  . Breast biopsy    . Trigger finger release  1980's    right thumb  . Incision and drainage of wound  ~ 1967    "infection in left foot" (08/28/2012)  . Incision and drainage of wound  2009    "3 ORs on my right hand after cat bite" (08/28/2012)  . 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 Cataract extraction w/ intraocular lens  implant, bilateral  1990's    Maureen Chatters, RD, LDN Pager #: [phone] After-Hours Pager #: [phone]

## 2013-05-26 NOTE — Progress Notes (Signed)
[facility]  Day #4 vancomycin  Subjective: I came to visit pt during rounds with IM team and discussion was focused on her rAKI   Antibiotics:  Anti-infectives   Start     Dose/Rate Route Frequency Ordered Stop   05/23/13 2300  vancomycin (VANCOCIN) IVPB 1000 mg/200 mL premix  Status:  Discontinued     1,000 mg 200 mL/hr over 60 Minutes Intravenous Every 24 hours 05/23/13 0121 05/23/13 1642   05/23/13 2300  vancomycin (VANCOCIN) IVPB 1000 mg/200 mL premix  Status:  Discontinued     1,000 mg 200 mL/hr over 60 Minutes Intravenous Every 24 hours 05/23/13 1656 05/26/13 1001   05/23/13 0130  piperacillin-tazobactam (ZOSYN) IVPB 3.375 g  Status:  Discontinued     3.375 g 12.5 mL/hr over 240 Minutes Intravenous Every 8 hours 05/23/13 0120 05/23/13 1642   05/22/13 2230  vancomycin (VANCOCIN) 1,250 mg in sodium chloride 0.9 % 250 mL IVPB     1,250 mg 166.7 mL/hr over 90 Minutes Intravenous  Once 05/22/13 2226 05/23/13 0226   05/22/13 2230  ceFEPIme (MAXIPIME) 1 g in dextrose 5 % 50 mL IVPB  Status:  Discontinued     1 g 100 mL/hr over 30 Minutes Intravenous  Once 05/22/13 2226 05/23/13 0101      Medications: Scheduled Meds: . atorvastatin  40 mg Oral QHS  . brimonidine  1 drop Both Eyes BID  . [START ON 05/27/2013] Chlorhexidine Gluconate Cloth  6 each Topical Q0600  . enoxaparin (LOVENOX) injection  30 mg Subcutaneous Q24H  . ezetimibe  10 mg Oral QHS  . insulin aspart  0-5 Units Subcutaneous TID WC  . insulin aspart  4 Units Subcutaneous TID WC  . insulin glargine  6 Units Subcutaneous BID  . isosorbide mononitrate  30 mg Oral QHS  . latanoprost  1 drop Both Eyes QHS  . levothyroxine  125 mcg Oral QAC breakfast  . metoprolol succinate  25 mg Oral Daily  . mupirocin ointment  1 application Nasal BID   Continuous Infusions:  PRN Meds:.acetaminophen, docusate sodium, morphine injection, ondansetron (ZOFRAN) IV, ondansetron   Objective: Weight  change: -7.1 oz (-0.2 kg)  Intake/Output Summary (Last 24 hours) at 05/26/13 2118 Last data filed at 05/26/13 1406  Gross per 24 hour  Intake    440 ml  Output      0 ml  Net    440 ml   Blood pressure 130/46, pulse 79, temperature 98.2 F (36.8 C), temperature source Oral, resp. rate 20, height 5\' 3"  (1.6 m), weight 167 lb 5.3 oz (75.9 kg), SpO2 98.00%. Temp:  [97.9 F (36.6 C)-98.5 F (36.9 C)] 98.2 F (36.8 C) (08/12 1406) Pulse Rate:  [65-79] 79 (08/12 1406) Resp:  [18-20] 20 (08/12 1406) BP: (130-164)/(46-61) 130/46 mmHg (08/12 1406) SpO2:  [97 %-98 %] 98 % (08/12 1406) Weight:  [167 lb 5.3 oz (75.9 kg)] 167 lb 5.3 oz (75.9 kg) (08/12 0630)  Physical Exam: General: pt very animated re her medical care and agreed to IVF hydration, i informed her we would also avoid vancomycin   Lab Results:  Recent Labs  05/25/13 1540 05/26/13 0440  WBC 9.6 8.1  HGB 11.2* 11.0*  HCT 32.1* 31.7*  PLT 206 206    BMET  Recent Labs  05/25/13 0545 05/25/13 1540 05/26/13 0440  NA 129*  --  131*  K 4.2  --  4.7  CL 95*  --  98  CO2  23  --  23  GLUCOSE 405*  --  201*  BUN 42*  --  46*  CREATININE 1.80* 1.72* 1.85*  CALCIUM 8.7  --  8.8    Micro Results: Recent Results (from the past 240 hour(s))  CULTURE, BLOOD (ROUTINE X 2)     Status: None   Collection Time    05/22/13 11:59 PM      Result Value Range Status   Specimen Description BLOOD RIGHT ARM   Final   Special Requests BOTTLES DRAWN AEROBIC ONLY [facility]   Final   Culture  Setup Time     Final   Value: 05/23/2013 11:17     Performed at [facility]   Culture     Final   Value:        BLOOD CULTURE RECEIVED NO GROWTH TO DATE CULTURE WILL BE HELD FOR 5 DAYS BEFORE ISSUING A FINAL NEGATIVE REPORT     Performed at [facility]   Report Status PENDING   Incomplete  CULTURE, BLOOD (ROUTINE X 2)     Status: None   Collection Time    05/23/13 12:15 AM      Result Value Range Status   Specimen Description  BLOOD LEFT HAND   Final   Special Requests BOTTLES DRAWN AEROBIC ONLY [facility]   Final   Culture  Setup Time     Final   Value: 05/23/2013 11:16     Performed at [facility]   Culture     Final   Value:        BLOOD CULTURE RECEIVED NO GROWTH TO DATE CULTURE WILL BE HELD FOR 5 DAYS BEFORE ISSUING A FINAL NEGATIVE REPORT     Performed at [facility]   Report Status PENDING   Incomplete  SURGICAL PCR SCREEN     Status: Abnormal   Collection Time    05/23/13  6:10 AM      Result Value Range Status   MRSA, PCR POSITIVE (*) NEGATIVE Final   Staphylococcus aureus POSITIVE (*) NEGATIVE Final   Comment:            The Xpert SA Assay (FDA     approved for NASAL specimens     in patients over 81 years of age),     is one component of     a comprehensive surveillance     program.  Test performance has     been validated by [employer] for patients greater     than or equal to 58 year old.     It is not intended     to diagnose infection nor to     guide or monitor treatment.    Studies/Results: No results found.    Assessment/Plan: [name] is a 67 y.o. female with   67 yo with DM, PVD and osteomyelitis now with AKI  #1 Osteomyelitis:  --agree with hold vancomycin and check random level --would change to cubicin when level not therapeutic  --WOULD ASK [facility] TO SEND TISSUE FOR BACTERIAL AEROBIC, ANEROBIC CULTURES FROM THE OR   --ALSO WOULD BE A GOOD IDEA TO HAVE THE MARGINS OF RESECTED TISSUE LOOKED AT BY PATHOLOGY TO SEE IF THEY ARE CLEAN OR WHETHER THEY ARE "CLEAN"   --WOULD ADJUST ABX POST CULTURESIN THE OR  --I WOULD TREAT HER FOR MINIMUM OF 2 WEEKS IF NOT 4 WEEKS OR LONGER DEPENDING ON SURGICAL MARGINS   #2 Screening:  would check for HIV and Hep C       LOS: 4 days   [name] 05/26/2013, 9:18 PM

## 2013-05-26 NOTE — Progress Notes (Addendum)
I have seen the patient and reviewed the daily progress note by [name] 4 Hosie Spangle and discussed the care of the patient with them.  See below for documentation of my findings, assessment, and plans.  Subjective: Patient states that she is ok. No c/o. No acute event. Her CBG is better.  Objective: Vital signs in last 24 hours: Filed Vitals:   05/25/13 1201 05/25/13 1444 05/25/13 2155 05/26/13 0630  BP: 124/50 150/64 138/50 164/61  Pulse: 73 70 65 79  Temp: 98.9 F (37.2 C) 97.5 F (36.4 C) 98.5 F (36.9 C) 97.9 F (36.6 C)  TempSrc: Oral Axillary Oral Oral  Resp: 20 20 20 18   Height:      Weight:    167 lb 5.3 oz (75.9 kg)  SpO2: 93% 98% 97% 98%   Weight change: -7.1 oz (-0.2 kg)  Intake/Output Summary (Last 24 hours) at 05/26/13 1125 Last data filed at 05/25/13 2300  Gross per 24 hour  Intake    200 ml  Output    550 ml  Net   -350 ml   General:NAD  Lungs : CTAB, no crackles, rhonchi or wheezes  Heart: RRR, murmur II/VI unchanged. No G/R. Abd: soft, BS x 4  LE; B/L LE erythema, swelling, tenderness, and slight warmth to touch. Erythema on leg unchanged from previous. Right foot bandaged with clean dry bandage  Lab Results: Reviewed and documented in Electronic Record Micro Results: Reviewed and documented in Electronic Record Studies/Results: Reviewed and documented in Electronic Record Medications: I have reviewed the patient's current medications. Scheduled Meds: . atorvastatin  40 mg Oral QHS  . brimonidine  1 drop Both Eyes BID  . enoxaparin (LOVENOX) injection  30 mg Subcutaneous Q24H  . ezetimibe  10 mg Oral QHS  . insulin aspart  0-5 Units Subcutaneous TID WC  . insulin aspart  4 Units Subcutaneous TID WC  . insulin glargine  6 Units Subcutaneous BID  . isosorbide mononitrate  30 mg Oral QHS  . latanoprost  1 drop Both Eyes QHS  . levothyroxine  125 mcg Oral QAC breakfast  . metoprolol succinate  25 mg Oral Daily   Continuous Infusions: . sodium  chloride     PRN Meds:.acetaminophen, morphine injection, ondansetron (ZOFRAN) IV, ondansetron Assessment/Plan:  [name] is a 67 yo with poorly controlled Type I DM, CAD s/p CABG, PVD s/p L fem to anterior tibial artery bypass, L forefoot amputation in 2011, and CKD who presents from clinic with right foot ulcer evaluation. MRI indicated OM.   # OM and cellulitis associated with Diabetic right midfoot ulcer   MRI showed  #. Osteomyelitis involving the head of the fifth metatarsal and  adjacent to the proximal phalanx of the fifth toe. Probable septic  fifth MTP joint, with a large adjacent fluid collection probably  draining to the scan.  2. Diffuse subcutaneous edema. Although diffuse cellulitis in the  foot is not excluded, the associated subcutaneous infiltrative  enhancement is most prominent in the vicinity of the osteomyelitis  and draining fluid collection.   -- Appreciate vascular input!!--plan to amputate affected toe and stent SFA 8/13 in AM. -  Appreciate ID consult -- Blood culture pending -- Will D/C vancomycin today due to worsening of kidney function. -- Dr. Daiva Eves would like to have Vancomycin level checked before starting Dapto.  # Hyponatremia   [name] 131, corrected to 133  - follow up BMP   # AKI on CKD (Stage III)  likely  multifactorial. Medication induced vs pre renal, and the contrast that she received on 05/25/13 would complicate it as well  - D/C all Nephrotoxic medications - IVF hydration-- she has been receiving IVF NS 75/h since admission. Received some 1/2 NS 100 ml/h postop on 05/25/13. Will give her NS 100/h x 10 hours now. - Monitor the fluid status closely-- Last Echo 11/14/11: EF 30-35%, grade 2 diastolic dysfunction), moderate to severe AS.  # Uncontrolled DM type 1  Patient reports that she has had hypoglycemia symptoms whenever her CBG is < 120. She would like her goal of FSBS to be 130-150. Given her GFR and weight, her calculated basal  requirement is 10-12 daily.  - Lantus increased to 6 BID - Novolog prandial coverage 4 TID - Customized scale is ordered--sensitivity factor is 60 now.  #CHF - Stable.   - Appreciate cardiology consult - Continue home mdur, metoprolol - D/C Lasix due to AKI  - Seen by Cards over weekend, appreciate their input, listed patient as moderate risk for surgery  - F/U ECHO ordered by cards    #PVD - Angiogram 8/11, OR tomorrow   #HLD - continue home Lipitor, ezetimibe   #Hypothyroidism - continue home synthroid   #DVT PPX - subq heparin   Dispo: Disposition is deferred at this time, awaiting improvement of current medical problems.  Anticipated discharge in approximately 2-3 day(s).   The patient does have a current PCP [name], MD) and does need an [facility] hospital follow-up appointment after discharge.  The patient does not have transportation limitations that hinder transportation to clinic appointments.  .Services Needed at time of discharge: Y = Yes, Blank = No PT:   OT:   RN:   Equipment:   Other:     LOS: 4 days   Dede Query, MD 05/26/2013, 11:25 AM

## 2013-05-26 NOTE — Progress Notes (Signed)
Patient ID: [name], female   DOB: 02/26/1946, 66 y.o.   MRN: [id number] Comfortable this morning. Remains afebrile. Yes plan for right superficial femoral artery endovascular treatment of her severe stenosis with Dr. Brabham tomorrow at 0 8:30 and plan for right fifth toe amputation following this in the operating room. 

## 2013-05-26 NOTE — Progress Notes (Signed)
Patient ID: [name], female   DOB: [birth year], 67 y.o.   MRN: [id number]   Subjective: No complaints since procedure. Sugar control better, without further episodes of hypoglycemic symptoms.  No CP/SOB/PND/Orthopnea No N/V s/p procedure yesterday. Requesting stool softener   Objective: Vital signs in last 24 hours: Filed Vitals:   05/25/13 1201 05/25/13 1444 05/25/13 2155 05/26/13 0630  BP: 124/50 150/64 138/50 164/61  Pulse: 73 70 65 79  Temp: 98.9 F (37.2 C) 97.5 F (36.4 C) 98.5 F (36.9 C) 97.9 F (36.6 C)  TempSrc: Oral Axillary Oral Oral  Resp: 20 20 20 18   Height:      Weight:    75.9 kg (167 lb 5.3 oz)  SpO2: 93% 98% 97% 98%     General:NAD  Lungs : CTAB, no crackles, wheezes  Heart: RRR, murmur II/VI unchanged Abd: soft, BS x 4  LE; B/L LE erythema, swelling, tenderness, and slight warmth to touch. Erythema on leg unchanged from previous. Right foot bandaged with clean dry bandage.   Lab Results: Basic Metabolic Panel:  Recent Labs Lab 05/23/13 0015  05/25/13 0545 05/25/13 1540 05/26/13 0440  NA 135  < > 129*  --  131*  K 4.4  < > 4.2  --  4.7  CL 100  < > 95*  --  98  CO2 26  < > 23  --  23  GLUCOSE 111*  < > 405*  --  201*  BUN 23  < > 42*  --  46*  CREATININE 1.36*  < > 1.80* 1.72* 1.85*  CALCIUM 9.5  < > 8.7  --  8.8  MG 2.1  --   --   --   --   < > = values in this interval not displayed.  8/12 Na+ corrected for glucose 133  Renal Function   Ref. Range 05/23/2013 04:01 05/24/2013 04:50 05/25/2013 05:45 05/25/2013 15:40 05/26/2013 04:40  Creatinine Latest Range: 0.50-1.10 mg/dL 4.09 (H) 8.11 (H) 9.14 (H) 1.72 (H) 1.85 (H)   Estimated Creatinine Clearance: 28.8 ml/min (by C-G formula based on Cr of 1.85).   CBC:  Recent Labs Lab 05/23/13 0015  05/25/13 1540 05/26/13 0440  WBC 11.4*  < > 9.6 8.1  NEUTROABS 8.9*  --   --   --   HGB 12.6  < > 11.2* 11.0*  HCT 36.8  < > 32.1* 31.7*  MCV 92.0  < > 90.2 90.1  PLT 301  < > 206 206    < > = values in this interval not displayed. CBG:  Recent Labs Lab 05/25/13 1227 05/25/13 1719 05/25/13 2158 05/26/13 0031 05/26/13 0430 05/26/13 0758  GLUCAP 339* 308* 286* 241* 183* 194*   Hemoglobin A1C:  Recent Labs Lab 05/22/13 1510  HGBA1C 9.8   Micro Results: Recent Results (from the past 240 hour(s))  CULTURE, BLOOD (ROUTINE X 2)     Status: None   Collection Time    05/22/13 11:59 PM      Result Value Range Status   Specimen Description BLOOD RIGHT ARM   Final   Special Requests BOTTLES DRAWN AEROBIC ONLY [facility]   Final   Culture  Setup Time     Final   Value: 05/23/2013 11:17     Performed at [facility]   Culture     Final   Value:        BLOOD CULTURE RECEIVED NO GROWTH TO DATE CULTURE WILL BE HELD FOR 5 DAYS  BEFORE ISSUING A FINAL NEGATIVE REPORT     Performed at [facility]   Report Status PENDING   Incomplete  CULTURE, BLOOD (ROUTINE X 2)     Status: None   Collection Time    05/23/13 12:15 AM      Result Value Range Status   Specimen Description BLOOD LEFT HAND   Final   Special Requests BOTTLES DRAWN AEROBIC ONLY [facility]   Final   Culture  Setup Time     Final   Value: 05/23/2013 11:16     Performed at [facility]   Culture     Final   Value:        BLOOD CULTURE RECEIVED NO GROWTH TO DATE CULTURE WILL BE HELD FOR 5 DAYS BEFORE ISSUING A FINAL NEGATIVE REPORT     Performed at [facility]   Report Status PENDING   Incomplete  SURGICAL PCR SCREEN     Status: Abnormal   Collection Time    05/23/13  6:10 AM      Result Value Range Status   MRSA, PCR POSITIVE (*) NEGATIVE Final   Staphylococcus aureus POSITIVE (*) NEGATIVE Final   Comment:            The Xpert SA Assay (FDA     approved for NASAL specimens     in patients over 37 years of age),     is one component of     a comprehensive surveillance     program.  Test performance has     been validated by [employer] for patients greater     than or equal  to 63 year old.     It is not intended     to diagnose infection nor to     guide or monitor treatment.   Studies/Results: Arteriogram 8/11 FINDINGS:  1. The right common iliac and external iliac arteries are widely patent. There is moderate disease in the right hypogastric artery.  2. The right common femoral and deep femoral artery are patent. The proximal superficial femoral artery is patent. There is a long segment of the superficial femoral artery in the mid thigh with diffuse disease with stenoses up to 80%.  3. The above-the-knee popliteal artery is patent area the below-knee popliteal artery is patent. The right posterior tibial artery is occluded. There is diffuse disease of the peroneal artery on the right although it is patent. The anterior tibial artery is patent. There is poor visualization of the dorsalis pedis in the foot.   Medications: I have reviewed the patient's current medications. Scheduled Meds:  atorvastatin  40 mg Oral QHS   brimonidine  1 drop Both Eyes BID   enoxaparin (LOVENOX) injection  30 mg Subcutaneous Q24H   ezetimibe  10 mg Oral QHS   insulin aspart  0-5 Units Subcutaneous TID WC   insulin aspart  4 Units Subcutaneous TID WC   insulin glargine  6 Units Subcutaneous BID   isosorbide mononitrate  30 mg Oral QHS   latanoprost  1 drop Both Eyes QHS   levothyroxine  125 mcg Oral QAC breakfast   metoprolol succinate  25 mg Oral Daily   Continuous Infusions:  sodium chloride     PRN Meds:.acetaminophen, morphine injection, ondansetron (ZOFRAN) IV, ondansetron Assessment/Plan: [name] is a 67 yo with poorly controlled Type I DM, CAD s/p CABG, PVD s/p L fem to anterior tibial artery bypass, L forefoot amputation in 2011,  and CKD who presents from clinic with right foot ulcer evaluation. MRI indicated OM.   # OM and cellulitis associated with Diabetic right midfoot ulcer -   Pt's vitals are stable, afebrile, does not appear toxic. WBC 8.1 on  8/12, blood cultures pending  - Appreciate vascular surg input, vascular assessment 8/11 indicated diffuse stenosis of the superficial femoral artery, plan to amputate affected toe and stent SFA 8/13 in AM. - NPO after midnight 8/12-8/13, Carb modified diet now - IV vancomycin on hold in light of AKI - appreciate ID recs for coverage - Follow up blood cultures  - Tylenol, morphine prn pain  - Docusate, 50mg  BID for constipation prevention  - Zofran prn nausea   #AKI on CKD (Stage III) Cr trending up from admission, today 1.85 (8/12) up from 1.8, 1.72 (8/11), from 1.54 (8/10), from 1.38 (8/9), which is about baseline.  BUN/Cr ratio 24.8 on 8/11. Likely mixed picture of pre-renal, vancomycin induced, and contrast induced nephropathy s/p arteriogram yesterday. -will d/c lasix and ASA today -Vancomycin on hold, trough today -d/c 1/2 NS -start 100cc NS/hour for 24 hours - Avoid nephrotoxic agents, including ibuprofen -Will monitor, repeat BMP daily   # uncontrolled Diabetes type 1 - HgA1C 9.8. CBGs improved from 8/11, last 183, trended down from 350s since yesterday. Her insulin plan was discussed with the patient at length, in light of worsening kidney function the agreed upon regimen for NPO and with diet status are:  -when eating,will increase to Lantus 6 BID, Novolog 4 units TID with meals with detailed instructions, SSI CBG < 70: Implement Hypoglycemia Protocol.  CBG 70 - 120 (dose in units): 0  CBG 121 - 150 (dose in units): 0  CBG 151 - 200 (dose in units): 1  CBG 201 - 250 (dose in units): 2  CBG 251 - 300 (dose in units): 3  CBG 301 - 350 (dose in units) 4  CBG 351 - 400 (dose in units): 5  CBG > 400 Call MD and obtain STAT lab verification.   -While NPO, will increase Lantus to 8 BID and hold Novolog.   #CHF - Stable.  - Continue home lasix, imdur, metoprolol -Seen by Cards over weekend, appreciate their input, listed patient as moderate risk for surgery -f/u ECHO and EKG  ordered by cards   #PVD - Stable. Angiogram 8/11, OR tomorrow   #HLD - continue home Lipitor, ezetimibe   #Hypothyroidism - continue home synthroid   #DVT PPX - subq heparin    Dispo: Disposition is deferred at this time, awaiting improvement of current medical problems.  Anticipated discharge in approximately 1-2 day(s).   The patient does have a current PCP [name], MD) and does need an [facility] hospital follow-up appointment after discharge.  The patient does not have transportation limitations that hinder transportation to clinic appointments.  .Services Needed at time of discharge: Y = Yes, Blank = No PT: Y  OT: Y  RN:   Equipment:   Other:     LOS: 4 days   Freddi Che, UNS MS4, Acting Intern  05/26/2013, 11:23 AM

## 2013-05-26 NOTE — Clinical Documentation Improvement (Signed)
THIS DOCUMENT IS NOT A PERMANENT PART OF THE MEDICAL RECORD  Please update your documentation with the medical record to reflect your response to this query. If you need help knowing how to do this please call [phone].  05/26/13   Dear Dr. Kem Kays / Associates,  In a better effort to capture your patient's severity of illness, reflect appropriate length of stay and utilization of resources, a review of the patient medical record has revealed the following indicators.    Based on your clinical judgment, please clarify and document in a progress note and/or discharge summary the clinical condition associated with the following supporting information:    Possible Clinical Conditions?  _____Hyponatremia   _____Other Condition  _____Cannot Clinically Determine     Lab results: Sodium: 8/12: 131 8/11: 129  Treatment: 8/12: 0.9% NaCl @ 100 ml/hr.   You may use possible, probable, or suspect with inpatient documentation. possible, probable, suspected diagnoses MUST be documented at the time of discharge This is not clinically significant and is a result of fluid shifts, so I would not include this as an active medical "diagnosis".  Reviewed:  no additional documentation provided  Thank You,  Marciano Sequin,  Clinical Documentation Specialist: [phone] Health Information Management [employer]

## 2013-05-26 NOTE — Progress Notes (Signed)
  Date: 05/26/2013  Patient name: ELENY CORTEZ  Medical record number: [id number]  Date of birth: May 06, 1946   This patient has been seen and the plan of care was discussed with the house staff. Please see their note for complete details. I concur with their findings with the following additions/corrections:  She has evidence of AKI. Given timing, I am concerned about vancomycin induced nephrotoxicity. This insult began around 05/22/13.  Discussed at length options of treatment. She agrees at this time to stop diuretics, start NS at 100 cc/hr x 10 hrs, stop ASA. Stop Vancomycin for now. Discussed with ID.  Unfortunately, she also received a contrast load yesterday, so we will have to monitor this.  We may see worsening renal function to a plateau. I discussed this with her.   Challenging to control her blood sugars, as she is very peculiar as to what she allows [place] to treat. She understands the consequences of uncontrolled DM, including PVD, infections, MI, CVA, progression of renal failure.  I've already discussed the need for better control, but I can only control her BG as much as she allows me to. Case discussed with house staff.  [name], DO 05/26/2013, 12:42 PM

## 2013-05-27 ENCOUNTER — Encounter (HOSPITAL_COMMUNITY)
Admission: AD | Disposition: A | Discharge status: Home Health | Payer: Self-pay | Source: Ambulatory Visit | Attending: Internal Medicine

## 2013-05-27 ENCOUNTER — Encounter (HOSPITAL_COMMUNITY): Payer: Self-pay | Admitting: Anesthesiology

## 2013-05-27 ENCOUNTER — Inpatient Hospital Stay (HOSPITAL_COMMUNITY): Payer: Medicare Other | Admitting: Anesthesiology

## 2013-05-27 ENCOUNTER — Telehealth: Payer: Self-pay | Admitting: Vascular Surgery

## 2013-05-27 HISTORY — PX: AMPUTATION: SHX166

## 2013-05-27 LAB — BASIC METABOLIC PANEL
CO2: 25 mEq/L (ref 19–32)
Calcium: 8.6 mg/dL (ref 8.4–10.5)
Glucose, Bld: 152 mg/dL — ABNORMAL HIGH (ref 70–99)
Sodium: 135 mEq/L (ref 135–145)

## 2013-05-27 LAB — GLUCOSE, CAPILLARY
Glucose-Capillary: 83 mg/dL (ref 70–99)
Glucose-Capillary: 95 mg/dL (ref 70–99)
Glucose-Capillary: 117 mg/dL — ABNORMAL HIGH (ref 70–99)

## 2013-05-27 LAB — CBC
Hemoglobin: 11.1 g/dL — ABNORMAL LOW (ref 12.0–15.0)
MCH: 31.2 pg (ref 26.0–34.0)
MCV: 90.2 fL (ref 78.0–100.0)
RBC: 3.56 MIL/uL — ABNORMAL LOW (ref 3.87–5.11)

## 2013-05-27 LAB — VANCOMYCIN, TROUGH: Vancomycin Tr: 21.9 ug/mL — ABNORMAL HIGH (ref 10.0–20.0)

## 2013-05-27 LAB — POCT ACTIVATED CLOTTING TIME: Activated Clotting Time: 232 seconds

## 2013-05-27 SURGERY — AMPUTATION DIGIT
Anesthesia: General | Site: Toe | Laterality: Right | Wound class: Dirty or Infected

## 2013-05-27 SURGERY — ATHERECTOMY PERIPHERAL ARTERY
Laterality: Right

## 2013-05-27 MED ORDER — ACETAMINOPHEN 650 MG RE SUPP: 325.0000 mg | RECTAL | Status: DC | PRN

## 2013-05-27 MED ORDER — FENTANYL CITRATE 0.05 MG/ML IJ SOLN
25.0000 ug | INTRAMUSCULAR | Status: DC | PRN
  Administered 2013-05-27 (×2): 50 ug via INTRAVENOUS
  Administered 2013-05-27: 25 ug via INTRAVENOUS

## 2013-05-27 MED ORDER — PHENOL 1.4 % MT LIQD: 1.0000 | OROMUCOSAL | Status: DC | PRN

## 2013-05-27 MED ORDER — SODIUM CHLORIDE 0.9 % IV SOLN
INTRAVENOUS | Status: DC
  Administered 2013-05-27: 08:00:00 via INTRAVENOUS

## 2013-05-27 MED ORDER — OXYCODONE HCL 5 MG PO TABS
10.0000 mg | ORAL_TABLET | ORAL | Status: DC | PRN
  Administered 2013-05-27: 10 mg via ORAL
  Filled 2013-05-27: qty 2

## 2013-05-27 MED ORDER — LIDOCAINE HCL (PF) 1 % IJ SOLN
INTRAMUSCULAR | Status: AC
  Filled 2013-05-27: qty 30

## 2013-05-27 MED ORDER — LACTATED RINGERS IV SOLN
INTRAVENOUS | Status: DC | PRN
  Administered 2013-05-27: 11:00:00 via INTRAVENOUS

## 2013-05-27 MED ORDER — FENTANYL CITRATE 0.05 MG/ML IJ SOLN
INTRAMUSCULAR | Status: DC | PRN
  Administered 2013-05-27: 25 ug via INTRAVENOUS

## 2013-05-27 MED ORDER — MIDAZOLAM HCL 2 MG/2ML IJ SOLN
INTRAMUSCULAR | Status: AC
  Filled 2013-05-27: qty 2

## 2013-05-27 MED ORDER — EPHEDRINE SULFATE 50 MG/ML IJ SOLN
INTRAMUSCULAR | Status: DC | PRN
  Administered 2013-05-27: 15 mg via INTRAVENOUS

## 2013-05-27 MED ORDER — FENTANYL CITRATE 0.05 MG/ML IJ SOLN
INTRAMUSCULAR | Status: AC
  Filled 2013-05-27: qty 2

## 2013-05-27 MED ORDER — LIDOCAINE HCL (CARDIAC) 20 MG/ML IV SOLN
INTRAVENOUS | Status: DC | PRN
  Administered 2013-05-27: 50 mg via INTRAVENOUS

## 2013-05-27 MED ORDER — HYDRALAZINE HCL 20 MG/ML IJ SOLN
INTRAMUSCULAR | Status: AC
  Filled 2013-05-27: qty 1

## 2013-05-27 MED ORDER — HEPARIN (PORCINE) IN NACL 2-0.9 UNIT/ML-% IJ SOLN
INTRAMUSCULAR | Status: AC
  Filled 2013-05-27: qty 1000

## 2013-05-27 MED ORDER — NITROGLYCERIN IN D5W 200-5 MCG/ML-% IV SOLN
INTRAVENOUS | Status: AC
  Filled 2013-05-27: qty 250

## 2013-05-27 MED ORDER — OXYCODONE HCL 5 MG PO TABS
5.0000 mg | ORAL_TABLET | Freq: Once | ORAL | Status: DC
  Filled 2013-05-27: qty 1

## 2013-05-27 MED ORDER — LABETALOL HCL 5 MG/ML IV SOLN
10.0000 mg | INTRAVENOUS | Status: DC | PRN
  Filled 2013-05-27: qty 4

## 2013-05-27 MED ORDER — ALUM & MAG HYDROXIDE-SIMETH 200-200-20 MG/5ML PO SUSP: 15.0000 mL | ORAL | Status: DC | PRN

## 2013-05-27 MED ORDER — METOPROLOL TARTRATE 1 MG/ML IV SOLN: 2.0000 mg | INTRAVENOUS | Status: DC | PRN

## 2013-05-27 MED ORDER — OXYCODONE HCL 5 MG/5ML PO SOLN: 5.0000 mg | Freq: Once | ORAL | Status: AC | PRN

## 2013-05-27 MED ORDER — SUCCINYLCHOLINE CHLORIDE 20 MG/ML IJ SOLN
INTRAMUSCULAR | Status: DC | PRN
  Administered 2013-05-27: 100 mg via INTRAVENOUS

## 2013-05-27 MED ORDER — SODIUM CHLORIDE 0.9 % IV SOLN
INTRAVENOUS | Status: DC
  Administered 2013-05-27 – 2013-06-02 (×5): via INTRAVENOUS

## 2013-05-27 MED ORDER — SODIUM CHLORIDE 0.9 % IV SOLN
500.0000 mg | INTRAVENOUS | Status: DC
  Administered 2013-05-27: 500 mg via INTRAVENOUS
  Filled 2013-05-27 (×2): qty 10

## 2013-05-27 MED ORDER — OXYCODONE HCL 5 MG PO TABS
5.0000 mg | ORAL_TABLET | Freq: Once | ORAL | Status: AC | PRN
  Administered 2013-05-27: 5 mg via ORAL

## 2013-05-27 MED ORDER — OXYCODONE HCL 5 MG PO TABS
ORAL_TABLET | ORAL | Status: AC
  Filled 2013-05-27: qty 1

## 2013-05-27 MED ORDER — PROPOFOL 10 MG/ML IV BOLUS
INTRAVENOUS | Status: DC | PRN
  Administered 2013-05-27: 125 mg via INTRAVENOUS

## 2013-05-27 MED ORDER — ONDANSETRON HCL 4 MG/2ML IJ SOLN: 4.0000 mg | Freq: Four times a day (QID) | INTRAMUSCULAR | Status: DC | PRN

## 2013-05-27 MED ORDER — ACETAMINOPHEN 325 MG PO TABS
325.0000 mg | ORAL_TABLET | ORAL | Status: DC | PRN
  Administered 2013-05-27: 650 mg via ORAL
  Filled 2013-05-27: qty 2

## 2013-05-27 MED ORDER — 0.9 % SODIUM CHLORIDE (POUR BTL) OPTIME
TOPICAL | Status: DC | PRN
  Administered 2013-05-27: 1000 mL

## 2013-05-27 MED ORDER — HYDRALAZINE HCL 20 MG/ML IJ SOLN: 10.0000 mg | INTRAMUSCULAR | Status: DC | PRN

## 2013-05-27 MED ORDER — GUAIFENESIN-DM 100-10 MG/5ML PO SYRP: 15.0000 mL | ORAL_SOLUTION | ORAL | Status: DC | PRN

## 2013-05-27 MED ORDER — PHENYLEPHRINE HCL 10 MG/ML IJ SOLN
INTRAMUSCULAR | Status: DC | PRN
  Administered 2013-05-27: 80 ug via INTRAVENOUS
  Administered 2013-05-27: 120 ug via INTRAVENOUS

## 2013-05-27 MED ORDER — OXYCODONE HCL 5 MG PO TABS
5.0000 mg | ORAL_TABLET | Freq: Four times a day (QID) | ORAL | Status: DC | PRN
  Administered 2013-05-27: 5 mg via ORAL
  Filled 2013-05-27: qty 1

## 2013-05-27 SURGICAL SUPPLY — 35 items
BANDAGE CONFORM 2  STR LF (GAUZE/BANDAGES/DRESSINGS) ×1 IMPLANT
BANDAGE ELASTIC 4 VELCRO ST LF (GAUZE/BANDAGES/DRESSINGS) ×1 IMPLANT
BANDAGE GAUZE ELAST BULKY 4 IN (GAUZE/BANDAGES/DRESSINGS) ×2 IMPLANT
CANISTER SUCTION 2500CC (MISCELLANEOUS) ×2 IMPLANT
CLIP LIGATING EXTRA MED SLVR (CLIP) ×1 IMPLANT
CLIP LIGATING EXTRA SM BLUE (MISCELLANEOUS) ×1 IMPLANT
CLOTH BEACON ORANGE TIMEOUT ST (SAFETY) ×2 IMPLANT
COVER SURGICAL LIGHT HANDLE (MISCELLANEOUS) ×2 IMPLANT
DRAPE EXTREMITY T 121X128X90 (DRAPE) ×2 IMPLANT
ELECT REM PT RETURN 9FT ADLT (ELECTROSURGICAL) ×2
ELECTRODE REM PT RTRN 9FT ADLT (ELECTROSURGICAL) ×1 IMPLANT
GAUZE SPONGE 2X2 8PLY STRL LF (GAUZE/BANDAGES/DRESSINGS) ×1 IMPLANT
GLOVE BIOGEL PI IND STRL 7.5 (GLOVE) IMPLANT
GLOVE BIOGEL PI INDICATOR 7.5 (GLOVE) ×2
GLOVE SS BIOGEL STRL SZ 7.5 (GLOVE) ×1 IMPLANT
GLOVE SUPERSENSE BIOGEL SZ 7.5 (GLOVE) ×1
GOWN STRL NON-REIN LRG LVL3 (GOWN DISPOSABLE) ×5 IMPLANT
KIT BASIN OR (CUSTOM PROCEDURE TRAY) ×2 IMPLANT
KIT ROOM TURNOVER OR (KITS) ×2 IMPLANT
NEEDLE 22X1 1/2 (OR ONLY) (NEEDLE) IMPLANT
NS IRRIG 1000ML POUR BTL (IV SOLUTION) ×2 IMPLANT
PACK GENERAL/GYN (CUSTOM PROCEDURE TRAY) ×2 IMPLANT
PAD ARMBOARD 7.5X6 YLW CONV (MISCELLANEOUS) ×4 IMPLANT
SPECIMEN JAR SMALL (MISCELLANEOUS) ×2 IMPLANT
SPONGE GAUZE 2X2 STER 10/PKG (GAUZE/BANDAGES/DRESSINGS)
SPONGE GAUZE 4X4 12PLY (GAUZE/BANDAGES/DRESSINGS) ×2 IMPLANT
SUT ETHILON 3 0 PS 1 (SUTURE) ×3 IMPLANT
SUT VIC AB 3-0 SH 27 (SUTURE)
SUT VIC AB 3-0 SH 27X BRD (SUTURE) ×1 IMPLANT
SYR CONTROL 10ML LL (SYRINGE) IMPLANT
TOWEL OR 17X24 6PK STRL BLUE (TOWEL DISPOSABLE) ×2 IMPLANT
TOWEL OR 17X26 10 PK STRL BLUE (TOWEL DISPOSABLE) ×2 IMPLANT
TUBE ANAEROBIC SPECIMEN COL (MISCELLANEOUS) IMPLANT
UNDERPAD 30X30 INCONTINENT (UNDERPADS AND DIAPERS) ×2 IMPLANT
WATER STERILE IRR 1000ML POUR (IV SOLUTION) ×2 IMPLANT

## 2013-05-27 NOTE — Progress Notes (Signed)
I have seen the patient and reviewed the daily progress note by MS 4 Hosie Spangle and discussed the care of the patient with them.  See below for documentation of my findings, assessment, and plans.  Subjective: Patient states that she is doing ok. No c/o. She is scheduled to go to her surgery. No acute events overnight.   Objective: Vital signs in last 24 hours: Filed Vitals:   05/27/13 1400 05/27/13 1415 05/27/13 1430 05/27/13 1433  BP: 128/51 125/51 134/50   Pulse: 76 75 75 74  Temp:      TempSrc:      Resp: 13 13 15 15   Height:      Weight:      SpO2: 100% 100% 100% 100%   Weight change: 2 lb 1.1 oz (0.939 kg)  Intake/Output Summary (Last 24 hours) at 05/27/13 1453 Last data filed at 05/27/13 1156  Gross per 24 hour  Intake    900 ml  Output   1150 ml  Net   -250 ml   General:NAD  Lungs : CTAB, no crackles, wheezes  Heart: RRR, murmur II/VI unchanged  Abd: soft, BS x 4  LE; B/L LE erythema, swelling, tenderness, and slight warmth to touch. Erythema on leg unchanged from previous. Right foot bandaged with clean dry bandage.   Lab Results: Reviewed and documented in Electronic Record Micro Results: Reviewed and documented in Electronic Record Studies/Results: Reviewed and documented in Electronic Record Medications: I have reviewed the patient's current medications. Scheduled Meds: . [MAR HOLD] atorvastatin  40 mg Oral QHS  . [MAR HOLD] brimonidine  1 drop Both Eyes BID  . [MAR HOLD] Chlorhexidine Gluconate Cloth  6 each Topical Q0600  . DAPTOmycin (CUBICIN)  IV  500 mg Intravenous Q48H  . [MAR HOLD] enoxaparin (LOVENOX) injection  30 mg Subcutaneous Q24H  . [facility] HOLD] ezetimibe  10 mg Oral QHS  . fentaNYL      . fentaNYL      . hydrALAZINE      . [MAR HOLD] insulin aspart  0-5 Units Subcutaneous TID WC  . [MAR HOLD] insulin aspart  4 Units Subcutaneous TID WC  . [MAR HOLD] insulin glargine  6 Units Subcutaneous BID  . [MAR HOLD] isosorbide mononitrate  30  mg Oral QHS  . [MAR HOLD] latanoprost  1 drop Both Eyes QHS  . [facility] HOLD] levothyroxine  125 mcg Oral QAC breakfast  . [MAR HOLD] metoprolol succinate  25 mg Oral Daily  . [facility] HOLD] mupirocin ointment  1 application Nasal BID  . oxyCODONE       Continuous Infusions:  PRN Meds:.acetaminophen, [QMV HOLD] docusate sodium, fentaNYL, hydrALAZINE, labetalol, [MAR HOLD]  morphine injection, [MAR HOLD] ondansetron (ZOFRAN) IV, ondansetron (ZOFRAN) IV, [MAR HOLD] ondansetron Assessment/Plan: Ms Mounsey is a 67 yo with poorly controlled Type I DM, CAD s/p CABG, PVD s/p L fem to anterior tibial artery bypass, L forefoot amputation in 2011, and CKD who presents from clinic with right foot ulcer evaluation. MRI indicated OM.   # OM and cellulitis associated with Diabetic right midfoot ulcer   -- Appreciate vascular input!!--amputation and stent SFA today.  -  Appreciate ID consult  -- Blood culture x 2 pending  -- Vancomycin stopped due to worsening of kidney function--Trough level 21.9.  -- Daptomycin per ID  #PVD - Angiogram 8/11    05/27/13 S/P atherectomy and angioplasty of the right superficial femoral artery    05/27/13 S/P Ray amputation right fifth toe  -  Appreciate VVS input!! - defer to VVS for management   # Hyponatremia , corrected  - follow up BMP   # AKI on CKD (Stage III)--better  Recent Labs Lab 05/24/13 0450 05/25/13 0545 05/25/13 1540 05/26/13 0440 05/27/13 0535  CREATININE 1.54* 1.80* 1.72* 1.85* 1.70*       likely multifactorial. Medication induced vs pre renal, and the contrast that she received on 05/25/13 would complicate it as well  - D/C all Nephrotoxic medications  - Monitor the fluid status closely-- Last Echo 11/14/11: EF 30-35%, grade 2 diastolic dysfunction), moderate to severe AS.  - BMP daily  # Uncontrolled DM type 1  Patient reports that she has had hypoglycemia symptoms whenever her CBG is < 120. She would like her goal of FSBS to be 130-150. Given  her GFR and weight, her calculated basal requirement is 10-12 daily.   - Lantus 6 BID--FSBS 152  - Novolog prandial coverage 4 TID  - Customized scale is ordered--sensitivity factor is 60 now.   # Anemia  Recent Labs Lab 05/25/13 1540 05/26/13 0440 05/27/13 0535  HGB 11.2* 11.0* 11.1*  HCT 32.1* 31.7* 32.1*  WBC 9.6 8.1 7.3  PLT 206 206 198   - patient was admitted with HH 12.6/36.8 - it remains on 11.1/32.1, Likely associated with dilutional effect from her IVF infusion since admission. - follow CBC since she is s/p Ray amputation right fifth toe on 05/27/13.   #CHF - Stable.  - Appreciate cardiology consult  - Continue home mdur, metoprolol  - D/C Lasix due to AKI  - Seen by Cards over weekend, appreciate their input, listed patient as moderate risk for surgery  - F/U ECHO ordered by cards   #HLD - continue home Lipitor, ezetimibe   #Hypothyroidism - continue home synthroid   #DVT PPX - Lovenox per VVS     Dispo: Disposition is deferred at this time, awaiting improvement of current medical problems.  Anticipated discharge in approximately 2-3 day(s).   The patient does have a current PCP [name], MD) and does need an [facility] hospital follow-up appointment after discharge.  The patient does not have transportation limitations that hinder transportation to clinic appointments.  .Services Needed at time of discharge: Y = Yes, Blank = No PT:   OT:   RN:   Equipment:   Other:     LOS: 5 days   Dede Query, MD 05/27/2013, 2:53 PM

## 2013-05-27 NOTE — Transfer of Care (Signed)
Immediate Anesthesia Transfer of Care Note  Patient: [name]  Procedure(s) Performed: Procedure(s): AMPUTATION DIGIT FIFTH TOE (Right)  Patient Location: PACU  Anesthesia Type:General  Level of Consciousness: awake, alert  and oriented  Airway & Oxygen Therapy: Patient Spontanous Breathing and Patient connected to face mask oxygen  Post-op Assessment: Report given to PACU RN, Post -op Vital signs reviewed and stable and Patient moving all extremities X 4  Post vital signs: Reviewed and stable  Complications: No apparent anesthesia complications

## 2013-05-27 NOTE — Op Note (Signed)
OPERATIVE REPORT  DATE OF SURGERY: 05/27/2013  PATIENT: [name], 67 y.o. female MRN: [id number]  DOB: 09/14/1946  PRE-OPERATIVE DIAGNOSIS: Gangrene right fifth toe  POST-OPERATIVE DIAGNOSIS:  Same  PROCEDURE: Ray amputation right fifth toe  SURGEON:  Gretta Began, M.D.  PHYSICIAN ASSISTANT: Nurse  ANESTHESIA:  Gen.  EBL: Minimal ml  Total I/O In: 900 [I.V.:900] Out: 50 [Blood:50]  BLOOD ADMINISTERED: Minimal  DRAINS: None  SPECIMEN: Right fifth toe  COUNTS CORRECT:  YES  PLAN OF CARE: PACU   PATIENT DISPOSITION:  PACU - hemodynamically stable  PROCEDURE DETAILS: Patient taken operating placed supine position where the right foot prepped in sterile fashion. A stat open gangrenous wound into the fifth metatarsal head from the lateral aspect of her foot. A tennis racket type incision was made at the base of the fifth toe and extending over the lateral metatarsal bone. This was carried down to the bone. There was no evidence of August in the more proximal lesion. This was flexure was resected with a rolled ureter and the bone was removed in its entirety. There was passed off the field. Lahey, wounds were irrigated with saline and hemostasis was obtained with electrocautery. The wounds were closed with 3-0 nylon mattress sutures. Sterile dressing was applied the patient was taken to the recovery in stable condition   Gretta Began, M.D. 05/27/2013 12:11 PM

## 2013-05-27 NOTE — Interval H&P Note (Signed)
History and Physical Interval Note:  05/27/2013 11:15 AM  [name]  has presented today for surgery, with the diagnosis of PVD WITH ULCER  The various methods of treatment have been discussed with the patient and family. After consideration of risks, benefits and other options for treatment, the patient has consented to  Procedure(s): AMPUTATION DIGIT FIFTH TOE (Right) as a surgical intervention .  The patient's history has been reviewed, patient examined, no change in status, stable for surgery.  I have reviewed the patient's chart and labs.  Questions were answered to the patient's satisfaction.     Nakiesha Rumsey

## 2013-05-27 NOTE — Progress Notes (Signed)
ANTIBIOTIC CONSULT NOTE - INITIAL  Pharmacy Consult for daptomycin Indication: osteomyelitis  Allergies  Allergen Reactions  . Adhesive [Tape] Other (See Comments)    "old [employer] adhesive tape; takes my skin off; can use paper tape"  . Bactrim [Sulfamethoxazole W-Trimethoprim]     Hyperkalemia and Increased creatinine  . Cephalexin Swelling and Rash  . Ciprofloxacin Swelling and Rash    Patient Measurements: Height: 5\' 3"  (160 cm) Weight: 168 lb (76.204 kg) IBW/kg (Calculated) : 52.4  Vital Signs: Temp: 98 F (36.7 C) (08/13 1503) Temp src: Oral (08/13 1503) BP: 139/60 mmHg (08/13 1503) Pulse Rate: 78 (08/13 1503) Intake/Output from previous day: 08/12 0701 - 08/13 0700 In: 240 [P.O.:240] Out: 1100 [Urine:1100] Intake/Output from this shift: Total I/O In: 900 [I.V.:900] Out: 50 [Blood:50]  Labs:  Recent Labs  05/25/13 1540 05/26/13 0440 05/27/13 0535  WBC 9.6 8.1 7.3  HGB 11.2* 11.0* 11.1*  PLT 206 206 198  CREATININE 1.72* 1.85* 1.70*   Estimated Creatinine Clearance: 31.4 ml/min (by C-G formula based on Cr of 1.7).  Recent Labs  05/26/13 2330  VANCOTROUGH 21.9*     Microbiology: Recent Results (from the past 720 hour(s))  CULTURE, BLOOD (ROUTINE X 2)     Status: None   Collection Time    05/22/13 11:59 PM      Result Value Range Status   Specimen Description BLOOD RIGHT ARM   Final   Special Requests BOTTLES DRAWN AEROBIC ONLY [facility]   Final   Culture  Setup Time     Final   Value: 05/23/2013 11:17     Performed at [facility]   Culture     Final   Value:        BLOOD CULTURE RECEIVED NO GROWTH TO DATE CULTURE WILL BE HELD FOR 5 DAYS BEFORE ISSUING A FINAL NEGATIVE REPORT     Performed at [facility]   Report Status PENDING   Incomplete  CULTURE, BLOOD (ROUTINE X 2)     Status: None   Collection Time    05/23/13 12:15 AM      Result Value Range Status   Specimen Description BLOOD LEFT HAND   Final   Special Requests  BOTTLES DRAWN AEROBIC ONLY 2[facility]   Final   Culture  Setup Time     Final   Value: 05/23/2013 11:16     Performed at [facility]   Culture     Final   Value:        BLOOD CULTURE RECEIVED NO GROWTH TO DATE CULTURE WILL BE HELD FOR 5 DAYS BEFORE ISSUING A FINAL NEGATIVE REPORT     Performed at [facility]   Report Status PENDING   Incomplete  SURGICAL PCR SCREEN     Status: Abnormal   Collection Time    05/23/13  6:10 AM      Result Value Range Status   MRSA, PCR POSITIVE (*) NEGATIVE Final   Staphylococcus aureus POSITIVE (*) NEGATIVE Final   Comment:            The Xpert SA Assay (FDA     approved for NASAL specimens     in patients over 4 years of age),     is one component of     a comprehensive surveillance     program.  Test performance has     been validated by [employer] for patients greater     than or  equal to 88 year old.     It is not intended     to diagnose infection nor to     guide or monitor treatment.    Medical History: Past Medical History  Diagnosis Date  . HTN (hypertension)   . Hypothyroidism   . History of recurrent UTIs   . CAD (coronary artery disease)     s/p CABG x3 in 1993; last cath in 2010  . Scleroderma   . Bilateral carpal tunnel syndrome 1970s  . 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 Followed by [name]  . Claudication   . Aortic stenosis     0.8cm2 on echo in 10/2011  . Hyperlipidemia     on Lipitor  . Cat bite 2009    with MRSA; required I & D  . Heart murmur   . Blood transfusion     no reaction from transfusion; "when I had heart surgery" (08/28/2012)  . GERD (gastroesophageal reflux disease)   . CHF (congestive heart failure) Aug. 2012    Echo 10/2011: Mild LVH, EF 30%, apex akinetic, septal HK, grade 2 diastolic dysfunction, or functionally bicuspid aortic valve, mild aortic stenosis by gradient, severe by calculated AVA-suspect A. Aortic  stenosis probably moderate, trivial MR, mild LAE, mild RAE.   [name] Cellulitis   . Anemia   . B12 deficiency anemia     B 12 injection "monthly since 05/2012" (08/28/2012)  . Iron deficiency anemia 06/13/2012    "iron infusion" (08/28/2012  . Anginal pain     "history; not currently" (08/28/2012)  . Myocardial infarction 1986    "silent"  . History of bronchitis     "I've had it twice in my life" (08/28/2012)  . Exertional dyspnea     "because of the heart failure" (08/28/2012)  . Type 1 diabetes 10/1949  . Stomach ulcer 1981?    "on Tagament for ~ 1 yr" (08/28/2012)  . Seizure     ? seizure like event in 2010; workup included stress testing which led to repeat cath.   . Arthritis     "hands, hips, all over" (08/28/2012  . Osteoarthritis of both feet   . Breast cancer     left  . Cellulitis June 2013, Nov. 2013 and Feb. 14, 2014    Left leg    Medications:  Anti-infectives   Start     Dose/Rate Route Frequency Ordered Stop   05/27/13 2200  DAPTOmycin (CUBICIN) 500 mg in sodium chloride 0.9 % IVPB     500 mg 220 mL/hr over 30 Minutes Intravenous Every 48 hours 05/27/13 1334     05/23/13 2300  vancomycin (VANCOCIN) IVPB 1000 mg/200 mL premix  Status:  Discontinued     1,000 mg 200 mL/hr over 60 Minutes Intravenous Every 24 hours 05/23/13 0121 05/23/13 1642   05/23/13 2300  vancomycin (VANCOCIN) IVPB 1000 mg/200 mL premix  Status:  Discontinued     1,000 mg 200 mL/hr over 60 Minutes Intravenous Every 24 hours 05/23/13 1656 05/26/13 1001   05/23/13 0130  piperacillin-tazobactam (ZOSYN) IVPB 3.375 g  Status:  Discontinued     3.375 g 12.5 mL/hr over 240 Minutes Intravenous Every 8 hours 05/23/13 0120 05/23/13 1642   05/22/13 2230  vancomycin (VANCOCIN) 1,250 mg in sodium chloride 0.9 % 250 mL IVPB     1,250 mg 166.7 mL/hr over 90 Minutes Intravenous  Once 05/22/13 2226  05/23/13 0226   05/22/13 2230  ceFEPIme (MAXIPIME) 1 g in dextrose 5 % 50 mL IVPB  Status:  Discontinued     1  g 100 mL/hr over 30 Minutes Intravenous  Once 05/22/13 2226 05/23/13 0101     Assessment: 67 yof initially started on vancomycin for R foot osteomyelitis. Scr trended up and vancomycin was subsequently stopped. Vanc trough was minimally elevated. Now initiating daptomycin. MD requests to start tonight when vancomycin will be subtherapeutic. Pt is afebrile and WBC is WNL. Scr down to 1.7 today with CrCl ~88ml/min.   8/8 Vanc>>8/12 8/8 Zosyn>>8/9 8/13 Dapto>>  8/8 Bldx2>>NGTD  8/12 VT = 21.9  Goal of Therapy:  Eradication of infection  Plan:  1. Daptomycin 500mg  IV Q48H - will change to Q24H if renal fxn continues to improve 2. F/u renal fxn, C&S, clinical status 3. Check CK in AM and weekly thereafter  Merrily Tegeler, Drake Leach 05/27/2013,3:05 PM

## 2013-05-27 NOTE — H&P (View-Only) (Signed)
Patient ID: [name], female   DOB: 08/17/1946, 67 y.o.   MRN: [id number] Comfortable this morning. Remains afebrile. Yes plan for right superficial femoral artery endovascular treatment of her severe stenosis with Dr. Myra Gianotti tomorrow at 0 8:30 and plan for right fifth toe amputation following this in the operating room.

## 2013-05-27 NOTE — Interval H&P Note (Signed)
History and Physical Interval Note:  05/27/2013 8:54 AM  [name]  has presented today for surgery, with the diagnosis of claudication/ulcer  The various methods of treatment have been discussed with the patient and family. After consideration of risks, benefits and other options for treatment, the patient has consented to  Procedure(s): LOWER EXTREMITY ANGIOGRAM (N/A) as a surgical intervention .  The patient's history has been reviewed, patient examined, no change in status, stable for surgery.  I have reviewed the patient's chart and labs.  Questions were answered to the patient's satisfaction.     Aalyssa Elderkin IV, V. WELLS

## 2013-05-27 NOTE — Progress Notes (Signed)
Orthopedic Tech Progress Note Patient Details:  [name] 1946/01/29 [id number]  Ortho Devices Type of Ortho Device: Darco shoe Ortho Device/Splint Location: RLE Ortho Device/Splint Interventions: Ordered;Application   [name] Moccasin 05/27/2013, 3:58 PM

## 2013-05-27 NOTE — Telephone Encounter (Addendum)
Message copied by Rosalyn Charters on Wed May 27, 2013  1:44 PM ------      Message from: [place], [place] K      Created: Wed May 27, 2013  1:10 PM      Regarding: schedule                   ----- Message -----         From: Dara Lords, PA-C         Sent: 05/27/2013  12:11 PM           To: Sharee Pimple, CMA            S/p amp right 5th toe 05/27/13.  F/u with [name] in 3 weeks.            Thanks,      Lelon Mast ------  notified patient of fu appt. with tfe on 06-09-13 at 1:00

## 2013-05-27 NOTE — Progress Notes (Signed)
Patient ID: [name], female   DOB: June 26, 1946, 67 y.o.   MRN: [id number]   Subjective: NAOE. Handling fluids without CP/SOB/PND/Orthopnea  Objective: Vital signs in last 24 hours: Filed Vitals:   05/26/13 0630 05/26/13 1406 05/26/13 2158 05/27/13 0608  BP: 164/61 130/46 158/61 133/62  Pulse: 79 79 79 77  Temp: 97.9 F (36.6 C) 98.2 F (36.8 C) 98.3 F (36.8 C) 97.7 F (36.5 C)  TempSrc: Oral Oral Oral Oral  Resp: 18 20 18 18   Height:      Weight: 75.9 kg (167 lb 5.3 oz)   76.839 kg (169 lb 6.4 oz)  SpO2: 98% 98% 96% 96%     General:NAD  Lungs : CTAB, no crackles, wheezes  Heart: RRR, murmur II/VI unchanged Abd: soft, BS x 4  LE; B/L LE erythema, swelling, tenderness, and slight warmth to touch. Erythema on leg unchanged from previous. Right foot bandaged with clean dry bandage.   Lab Results: Basic Metabolic Panel:  Recent Labs Lab 05/23/13 0015  05/26/13 0440 05/27/13 0535  NA 135  < > 131* 135  K 4.4  < > 4.7 4.1  CL 100  < > 98 103  CO2 26  < > 23 25  GLUCOSE 111*  < > 201* 152*  BUN 23  < > 46* 39*  CREATININE 1.36*  < > 1.85* 1.70*  CALCIUM 9.5  < > 8.8 8.6  MG 2.1  --   --   --   < > = values in this interval not displayed.  8/12 Na+ corrected for glucose 133  Renal Function   Ref. Range 05/23/2013 04:01 05/24/2013 04:50 05/25/2013 05:45 05/25/2013 15:40 05/26/2013 04:40 05/27/2013 05:35  Creatinine Latest Range: 0.50-1.10 mg/dL 4.09 (H) 8.11 (H) 9.14 (H) 1.72 (H) 1.85 (H) 1.70 (H)   Estimated Creatinine Clearance: 31.5 ml/min (by C-G formula based on Cr of 1.7).   CBC:  Recent Labs Lab 05/23/13 0015  05/26/13 0440 05/27/13 0535  WBC 11.4*  < > 8.1 7.3  NEUTROABS 8.9*  --   --   --   HGB 12.6  < > 11.0* 11.1*  HCT 36.8  < > 31.7* 32.1*  MCV 92.0  < > 90.1 90.2  PLT 301  < > 206 198  < > = values in this interval not displayed. CBG:  Recent Labs Lab 05/26/13 0031 05/26/13 0430 05/26/13 0758 05/26/13 1235 05/26/13 1729  05/26/13 2158  GLUCAP 241* 183* 194* 168* 181* 228*   Hemoglobin A1C:  Recent Labs Lab 05/22/13 1510  HGBA1C 9.8   Micro Results: Recent Results (from the past 240 hour(s))  CULTURE, BLOOD (ROUTINE X 2)     Status: None   Collection Time    05/22/13 11:59 PM      Result Value Range Status   Specimen Description BLOOD RIGHT ARM   Final   Special Requests BOTTLES DRAWN AEROBIC ONLY [facility]   Final   Culture  Setup Time     Final   Value: 05/23/2013 11:17     Performed at [facility]   Culture     Final   Value:        BLOOD CULTURE RECEIVED NO GROWTH TO DATE CULTURE WILL BE HELD FOR 5 DAYS BEFORE ISSUING A FINAL NEGATIVE REPORT     Performed at [facility]   Report Status PENDING   Incomplete  CULTURE, BLOOD (ROUTINE X 2)     Status: None   Collection Time  05/23/13 12:15 AM      Result Value Range Status   Specimen Description BLOOD LEFT HAND   Final   Special Requests BOTTLES DRAWN AEROBIC ONLY 2CC   Final   Culture  Setup Time     Final   Value: 05/23/2013 11:16     Performed at [facility]   Culture     Final   Value:        BLOOD CULTURE RECEIVED NO GROWTH TO DATE CULTURE WILL BE HELD FOR 5 DAYS BEFORE ISSUING A FINAL NEGATIVE REPORT     Performed at [facility]   Report Status PENDING   Incomplete  SURGICAL PCR SCREEN     Status: Abnormal   Collection Time    05/23/13  6:10 AM      Result Value Range Status   MRSA, PCR POSITIVE (*) NEGATIVE Final   Staphylococcus aureus POSITIVE (*) NEGATIVE Final   Comment:            The Xpert SA Assay (FDA     approved for NASAL specimens     in patients over 25 years of age),     is one component of     a comprehensive surveillance     program.  Test performance has     been validated by [employer] for patients greater     than or equal to 14 year old.     It is not intended     to diagnose infection nor to     guide or monitor treatment.   Studies/Results: Arteriogram 8/11  FINDINGS:  1. The right common iliac and external iliac arteries are widely patent. There is moderate disease in the right hypogastric artery.  2. The right common femoral and deep femoral artery are patent. The proximal superficial femoral artery is patent. There is a long segment of the superficial femoral artery in the mid thigh with diffuse disease with stenoses up to 80%.  3. The above-the-knee popliteal artery is patent area the below-knee popliteal artery is patent. The right posterior tibial artery is occluded. There is diffuse disease of the peroneal artery on the right although it is patent. The anterior tibial artery is patent. There is poor visualization of the dorsalis pedis in the foot.   Medications: I have reviewed the patient's current medications. Scheduled Meds:  atorvastatin  40 mg Oral QHS   brimonidine  1 drop Both Eyes BID   Chlorhexidine Gluconate Cloth  6 each Topical Q0600   enoxaparin (LOVENOX) injection  30 mg Subcutaneous Q24H   ezetimibe  10 mg Oral QHS   insulin aspart  0-5 Units Subcutaneous TID WC   insulin aspart  4 Units Subcutaneous TID WC   insulin glargine  6 Units Subcutaneous BID   isosorbide mononitrate  30 mg Oral QHS   latanoprost  1 drop Both Eyes QHS   levothyroxine  125 mcg Oral QAC breakfast   metoprolol succinate  25 mg Oral Daily   mupirocin ointment  1 application Nasal BID   Continuous Infusions:   PRN Meds:.acetaminophen, docusate sodium, morphine injection, ondansetron (ZOFRAN) IV, ondansetron Assessment/Plan: [name] is a 67 yo with poorly controlled Type I DM, CAD s/p CABG, PVD s/p L fem to anterior tibial artery bypass, L forefoot amputation in 2011, and CKD who presents from clinic with right foot ulcer evaluation. MRI indicated OM.   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 WBC 7.3 on 8/13, blood cultures negative to date  -  Appreciate vascular surg input, vascular assessment 8/11 indicated diffuse stenosis of the superficial femoral artery, plan to amputate affected toe and stent SFA 8/13 in AM. - NPO until post-op - IV vancomycin on hold in light of AKI, trough last night 21.9, goal of therapy is 15-20.  - appreciate ID recs for coverage, will switch to Cubicin, per pharmacy dosing, first dose 8/13 @10pm   - Follow up blood cultures  - Tylenol, morphine prn pain  - Docusate, 50mg  BID for constipation prevention  - Zofran prn nausea   #AKI on CKD (Stage III) Cr trending up from admission, better today, 1.7 from 1.85 (8/12) up from 1.8, 1.72 (8/11), from 1.54 (8/10), from 1.38 (8/9), which is about baseline.  BUN/Cr ratio 24.8 on 8/11. Likely mixed picture of pre-renal, vancomycin induced, and contrast induced nephropathy s/p arteriogram yesterday. -hold lasix and ASA  -Vancomycin on hold, switching coverage to Cubicin -completed 100cc NS/hour for 10 hours last night - Avoid nephrotoxic agents, including ibuprofen -Will monitor, repeat BMP daily  # uncontrolled Diabetes type 1 - HgA1C 9.8. CBGs improved from 8/11, 8/13 morning 152, trended down from 350s since 8/11. The increase in her lantus from 5 to 6 8/12 has improved her fasting sugar from 180s to 150s, (sensitivity factor 1:30). Her insulin plan was discussed with the patient at length, in light of worsening kidney function the agreed upon regimen for NPO and with diet status are:  -when eating,will increase to Lantus 6 BID, Novolog 4 units TID with meals with detailed instructions, SSI CBG < 70: Implement Hypoglycemia Protocol.  CBG 70 - 120 (dose in units): 0  CBG 121 - 150 (dose in units): 0  CBG 151 - 200 (dose in units): 1  CBG 201 - 250 (dose in units): 2  CBG 251 - 300 (dose in units): 3  CBG 301 - 350 (dose in units) 4  CBG 351 - 400 (dose in units): 5  CBG > 400 Call MD and obtain STAT lab verification.   -While NPO, will increase Lantus to 8  BID and hold Novolog.  #CHF - Stable.  - holding home lasix while AKI active, continuing the imdur, metoprolol -Seen by Cards over weekend, appreciate their input, listed patient as moderate risk for surgery -f/u ECHO and EKG ordered by cards   #PVD - Stable. Arteriogram 8/11, OR today  #HLD - continue home Lipitor, ezetimibe   #Hypothyroidism - continue home synthroid   #DVT PPX - subq heparin    Dispo: Disposition is deferred at this time, awaiting improvement of current medical problems.  Anticipated discharge in approximately 1-2 day(s).   The patient does have a current PCP [name], MD) and does need an [facility] hospital follow-up appointment after discharge.  The patient does not have transportation limitations that hinder transportation to clinic appointments.  .Services Needed at time of discharge: Y = Yes, Blank = No PT: Y  OT: Y  RN:   Equipment:   Other:     LOS: 5 days   Freddi Che, UNS MS4, Acting Intern  05/27/2013, 6:45 AM

## 2013-05-27 NOTE — Op Note (Signed)
[facility]  Patient name: [name] MRN: [id number] DOB: 1946-05-28 Sex: female  05/22/2013 - 05/27/2013 Pre-operative Diagnosis: right foot ulcer Post-operative diagnosis:  Same Surgeon:  Jorge Ny Procedure Performed:  1.  ultrasound access, left femoral artery  2.  atherectomy, right superficial femoral artery  3.  balloon angioplasty, right superficial femoral artery    Indications:  The patient has a right foot ulcer which will require amputation later today. Diagnostic angiography revealed a diffusely diseased right superficial femoral artery. She comes in today for intervention  Procedure:  The patient was identified in the holding area and taken to room 8.  The patient was then placed supine on the table and prepped and draped in the usual sterile fashion.  A time out was called.  Ultrasound was used to evaluate the left common femoral artery.  It was patent .  A digital ultrasound image was acquired.  A micropuncture needle was used to access the left common femoral artery under ultrasound guidance.  An 018 wire was advanced without resistance and a micropuncture sheath was placed.  The 018 wire was removed and a benson wire was placed.  The micropuncture sheath was exchanged for a 7 [place] Ansel 1 sheath.  I had difficulty crossing the aortic bifurcation because of the angulation. Ultimately, a Teena Dunk wire was placed into the right external iliac artery. A 4 French sheath was then advanced. I used a Glidewire to get access into the profunda. A 035 Rosen was then advanced and the sheath was finally able to be placed into the right external iliac artery. The patient was fully heparinized. Next using a Glidewire and a 4 French endhole catheter, the lesions within the superficial femoral artery were successfully crossed. A Viper was inserted. I used a CSI orbital atherectomy 1.5 classic device to perform atherectomy at low medium and high speed  throughout the 15 cm treated segment. I then used a 4 x 1 50 balloon to perform balloon angioplasty of the treated area. The balloon was taken to 7 atmospheres and held for 3 minutes. Completion angiography revealed significantly improved blood flow throughout the superficial femoral artery. The runoff remained unchanged which is predominantly the anterior tibial artery. After our results were obtained, the sheath was withdrawn to the left distal common iliac artery. The wire was removed. The patient taken the holding area.    Impression:  #1  successful atherectomy and angioplasty of the right superficial femoral artery using a CSI 1.5 classic device and a 4 mm x 1 50 balloon   V. Durene Cal, M.D. [facility] Office: [phone] Pager:  [phone]

## 2013-05-27 NOTE — Anesthesia Procedure Notes (Addendum)
Procedure Name: LMA Insertion Date/Time: 05/27/2013 11:13 AM Performed by: Carmela Rima Pre-anesthesia Checklist: Patient identified, Emergency Drugs available, Suction available, Patient being monitored and Timeout performed Patient Re-evaluated:Patient Re-evaluated prior to inductionOxygen Delivery Method: Circle system utilized Preoxygenation: Pre-oxygenation with 100% oxygen Intubation Type: IV induction Ventilation: Mask ventilation without difficulty LMA: LMA inserted LMA Size: 4.0 Number of attempts: 2 Placement Confirmation: positive ETCO2 and breath sounds checked- equal and bilateral (poor quality wave form of ETCO2) Tube secured with: Tape Dental Injury: Teeth and Oropharynx as per pre-operative assessment    Procedure Name: Intubation Date/Time: 05/27/2013 11:23 AM Performed by: Carmela Rima Pre-anesthesia Checklist: Patient identified, Emergency Drugs available, Suction available, Patient being monitored and Timeout performed Patient Re-evaluated:Patient Re-evaluated prior to inductionOxygen Delivery Method: Circle system utilized Preoxygenation: Pre-oxygenation with 100% oxygen Intubation Type: IV induction Ventilation: Mask ventilation without difficulty Laryngoscope Size: Mac and 3 Grade View: Grade III Tube type: Oral Number of attempts: 3 Placement Confirmation: positive ETCO2,  CO2 detector and ETT inserted through vocal cords under direct vision Secured at: 22 cm Tube secured with: Tape Dental Injury: Teeth and Oropharynx as per pre-operative assessment

## 2013-05-27 NOTE — Anesthesia Preprocedure Evaluation (Addendum)
Anesthesia Evaluation  Patient identified by MRN, date of birth, ID band Patient awake    Reviewed: Allergy & Precautions, H&P , NPO status , Patient's Chart, lab work & pertinent test results  Airway Mallampati: II  Neck ROM: full    Dental  (+) Teeth Intact and Dental Advidsory Given   Pulmonary shortness of breath,          Cardiovascular hypertension, + angina + CAD, + Past MI, + CABG, + Peripheral Vascular Disease and +CHF + Valvular Problems/Murmurs AS     Neuro/Psych Seizures -,   Neuromuscular disease    GI/Hepatic PUD, GERD-  ,  Endo/Other  diabetes, Insulin DependentHypothyroidism   Renal/GU Renal InsufficiencyRenal disease     Musculoskeletal  (+) Arthritis -,   Abdominal   Peds  Hematology   Anesthesia Other Findings   Reproductive/Obstetrics                          Anesthesia Physical Anesthesia Plan  ASA: IV  Anesthesia Plan: MAC and Regional   Post-op Pain Management:    Induction: Intravenous  Airway Management Planned: Simple Face Mask  Additional Equipment:   Intra-op Plan:   Post-operative Plan:   Informed Consent: I have reviewed the patients History and Physical, chart, labs and discussed the procedure including the risks, benefits and alternatives for the proposed anesthesia with the patient or authorized representative who has indicated his/her understanding and acceptance.   Dental Advisory Given  Plan Discussed with: CRNA, Anesthesiologist and Surgeon  Anesthesia Plan Comments:        Anesthesia Quick Evaluation

## 2013-05-27 NOTE — Progress Notes (Signed)
  Date: 05/27/2013  Patient name: [name]  Medical record number: [id number]  Date of birth: Jan 05, 1946   This patient has been seen and the plan of care was discussed with the house staff. Please see their note for complete details. I concur with their findings with the following additions/corrections:  Seen post op. She is groggy, but feels ok. Her renal function is improved.  Currently on daptomycin.  No hypoglycemia.  Watch fluid status.  [name], DO 05/27/2013, 4:11 PM

## 2013-05-27 NOTE — Anesthesia Postprocedure Evaluation (Signed)
Anesthesia Post Note  Patient: [name]  Procedure(s) Performed: Procedure(s) (LRB): AMPUTATION DIGIT FIFTH TOE (Right)  Anesthesia type: General  Patient location: PACU  Post pain: Pain level controlled and Adequate analgesia  Post assessment: Post-op Vital signs reviewed, Patient's Cardiovascular Status Stable, Respiratory Function Stable, Patent Airway and Pain level controlled  Last Vitals:  Filed Vitals:   05/27/13 1245  BP: 153/63  Pulse: 71  Temp:   Resp: 10    Post vital signs: Reviewed and stable  Level of consciousness: awake, alert  and oriented  Complications: No apparent anesthesia complications

## 2013-05-27 NOTE — Progress Notes (Signed)
ANTIBIOTIC CONSULT NOTE - Follow-up  Pharmacy Consult for vancomycin Indication: r/o osteomyelitis  Allergies  Allergen Reactions  . Adhesive [Tape] Other (See Comments)    "old [employer] adhesive tape; takes my skin off; can use paper tape"  . Bactrim [Sulfamethoxazole W-Trimethoprim]     Hyperkalemia and Increased creatinine  . Cephalexin Swelling and Rash  . Ciprofloxacin Swelling and Rash    Assessment: 67 yo female with osteomyelitis receiving vancomycin.  ID recommends changing to Cubicin once vancomycin level subtherapeutic.  Vancomycin trough tonight 21.9  Goal of Therapy:  Vancomycin trough level 15-20 mcg/ml  Plan:  Suggest Cubicin 500 mg IV q48h starting ~10 am today If her renal function were to improve and SCr were to decrease to < 1.6, would need to adjust Cubicin to q24h  [name], PharmD, BCPS  05/27/2013 1:59 AM

## 2013-05-28 ENCOUNTER — Encounter (HOSPITAL_COMMUNITY): Payer: Self-pay | Admitting: Vascular Surgery

## 2013-05-28 LAB — BASIC METABOLIC PANEL
CO2: 20 mEq/L (ref 19–32)
Glucose, Bld: 100 mg/dL — ABNORMAL HIGH (ref 70–99)
Potassium: 4.2 mEq/L (ref 3.5–5.1)
Sodium: 135 mEq/L (ref 135–145)

## 2013-05-28 LAB — CBC
Hemoglobin: 10.6 g/dL — ABNORMAL LOW (ref 12.0–15.0)
MCH: 31.7 pg (ref 26.0–34.0)
RBC: 3.34 MIL/uL — ABNORMAL LOW (ref 3.87–5.11)

## 2013-05-28 LAB — GLUCOSE, CAPILLARY: Glucose-Capillary: 125 mg/dL — ABNORMAL HIGH (ref 70–99)

## 2013-05-28 MED ORDER — SODIUM CHLORIDE 0.9 % IV SOLN
500.0000 mg | INTRAVENOUS | Status: DC
  Administered 2013-05-28: 500 mg via INTRAVENOUS
  Filled 2013-05-28 (×2): qty 10

## 2013-05-28 MED ORDER — SODIUM CHLORIDE 0.9 % IV SOLN
1.0000 g | Freq: Two times a day (BID) | INTRAVENOUS | Status: DC
  Administered 2013-05-28 – 2013-05-29 (×4): 1 g via INTRAVENOUS
  Filled 2013-05-28 (×8): qty 1

## 2013-05-28 MED ORDER — MORPHINE SULFATE 2 MG/ML IJ SOLN: 1.0000 mg | INTRAMUSCULAR | Status: DC | PRN

## 2013-05-28 MED ORDER — OXYCODONE HCL 5 MG PO TABS
10.0000 mg | ORAL_TABLET | Freq: Four times a day (QID) | ORAL | Status: DC | PRN
  Administered 2013-05-28 – 2013-05-30 (×3): 5 mg via ORAL
  Filled 2013-05-28 (×2): qty 2
  Filled 2013-05-28: qty 1
  Filled 2013-05-28: qty 2

## 2013-05-28 NOTE — Progress Notes (Addendum)
  Date: 05/28/2013  Patient name: [name]  Medical record number: [id number]  Date of birth: Oct 27, 1945   This patient has been seen and the plan of care was discussed with the house staff. Please see their note for complete details. I concur with their findings with the following additions/corrections:  POD #1 right fifth toe amputation, R Superficial fem artery angioplasty/atherectomy.  Being followed by vascular surgery.   She is happy with her BG control, we will continue current course. Pain is otherwise well controlled, she just does not want to move her leg much at this time due to pain on movement. Renal function is improving, would dose abx to a CrCl over 30 safely at this time.  [name], DO 05/28/2013, 12:45 PM

## 2013-05-28 NOTE — Progress Notes (Signed)
Subjective: Pain was very severe last night, patient was uncomfortable and feeling anxious. Today she is doing well and pain is more tolerable. Tolerating Diet. Had been nauseated post op, no vomiting.  Sugars better     Objective: Vital signs in last 24 hours: Filed Vitals:   05/27/13 1503 05/27/13 2123 05/28/13 0214 05/28/13 0620  BP: 139/60 143/53 132/55 135/50  Pulse: 78 76 67 63  Temp: 98 F (36.7 C) 97.9 F (36.6 C) 98.4 F (36.9 C) 98.1 F (36.7 C)  TempSrc: Oral Oral Oral Oral  Resp: 16 18 18 18   Height:      Weight:    76.2 kg (167 lb 15.9 oz)  SpO2: 98% 94% 93% 94%   Weight change: -0.635 kg (-1 lb 6.4 oz)  Intake/Output Summary (Last 24 hours) at 05/28/13 0741 Last data filed at 05/28/13 0649  Gross per 24 hour  Intake 2058.33 ml  Output    550 ml  Net 1508.33 ml   General:NAD  Lungs : CTAB, no crackles, wheezes  Heart: RRR, murmur II/VI unchanged  Abd: soft, BS x 4  LE; B/L LE erythema, swelling, tenderness, and slight warmth to touch. Erythema on leg unchanged from previous. Right foot bandaged with clean dry bandage.   Lab Results: CBC    Component Value Date/Time   WBC 9.5 05/28/2013 0515   WBC 5.9 08/07/2012 1101   WBC 5.3 11/29/2011 1629   RBC 3.34* 05/28/2013 0515   RBC 4.47 08/07/2012 1101   RBC 4.37 04/04/2012 0610   RBC 3.71* 11/29/2011 1629   HGB 10.6* 05/28/2013 0515   HGB 12.9 08/07/2012 1101   HCT 30.6* 05/28/2013 0515   HCT 39.8 08/07/2012 1101   PLT 214 05/28/2013 0515   PLT 207 08/07/2012 1101   MCV 91.6 05/28/2013 0515   MCV 89.0 08/07/2012 1101   MCH 31.7 05/28/2013 0515   MCH 29.0 08/07/2012 1101   MCH 27.5 11/29/2011 1629   MCHC 34.6 05/28/2013 0515   MCHC 32.5 08/07/2012 1101   MCHC 32.1 11/29/2011 1629   RDW 14.8 05/28/2013 0515   RDW 19.3* 08/07/2012 1101   RDW 14.2 11/29/2011 1629   LYMPHSABS 1.2 05/23/2013 0015   LYMPHSABS 1.0 08/07/2012 1101   LYMPHSABS 1.4 11/29/2011 1629   MONOABS 0.8 05/23/2013 0015   MONOABS 0.9  08/07/2012 1101   EOSABS 0.4 05/23/2013 0015   EOSABS 0.4 08/07/2012 1101   BASOSABS 0.0 05/23/2013 0015   BASOSABS 0.0 08/07/2012 1101   BASOSABS 0.0 11/29/2011 1629    CMP     Component Value Date/Time   NA 135 05/28/2013 0515   NA 137 08/07/2012 1101   NA 133* 11/29/2011 1629   K 4.2 05/28/2013 0515   K 4.1 08/07/2012 1101   CL 106 05/28/2013 0515   CL 102 08/07/2012 1101   CO2 20 05/28/2013 0515   CO2 25 08/07/2012 1101   GLUCOSE 100* 05/28/2013 0515   GLUCOSE 320* 08/07/2012 1101   GLUCOSE 313* 11/29/2011 1629   BUN 34* 05/28/2013 0515   BUN 25.0 08/07/2012 1101   BUN 39* 11/29/2011 1629   CREATININE 1.51* 05/28/2013 0515   CREATININE 1.31* 12/05/2012 1625   CREATININE 1.0 08/07/2012 1101   CALCIUM 8.5 05/28/2013 0515   CALCIUM 9.8 08/07/2012 1101   PROT 8.1 11/28/2012 1900   ALBUMIN 3.5 11/28/2012 1900   AST 42* 11/28/2012 1900   ALT 38* 11/28/2012 1900   ALKPHOS 167* 11/28/2012 1900   BILITOT 0.7 11/28/2012 1900  GFRNONAA 35* 05/28/2013 0515   GFRAA 40* 05/28/2013 0515    CBG (last 3)   Recent Labs  05/27/13 1543 05/27/13 1659 05/27/13 2122  GLUCAP 95 102* 117*    CREATININE: 1.51 mg/dL ABNORMAL (82/95/62 1308) Estimated creatinine clearance - Cockcroft-Gault CrCl: 35.3 mL/min   Micro Results: Results for orders placed during the hospital encounter of 05/22/13  CULTURE, BLOOD (ROUTINE X 2)     Status: None   Collection Time    05/22/13 11:59 PM      Result Value Range Status   Specimen Description BLOOD RIGHT ARM   Final   Special Requests BOTTLES DRAWN AEROBIC ONLY [facility]   Final   Culture  Setup Time     Final   Value: 05/23/2013 11:17     Performed at [facility]   Culture     Final   Value:        BLOOD CULTURE RECEIVED NO GROWTH TO DATE CULTURE WILL BE HELD FOR 5 DAYS BEFORE ISSUING A FINAL NEGATIVE REPORT     Performed at [facility]   Report Status PENDING   Incomplete  CULTURE, BLOOD (ROUTINE X 2)     Status: None   Collection Time     05/23/13 12:15 AM      Result Value Range Status   Specimen Description BLOOD LEFT HAND   Final   Special Requests BOTTLES DRAWN AEROBIC ONLY [facility]   Final   Culture  Setup Time     Final   Value: 05/23/2013 11:16     Performed at [facility]   Culture     Final   Value:        BLOOD CULTURE RECEIVED NO GROWTH TO DATE CULTURE WILL BE HELD FOR 5 DAYS BEFORE ISSUING A FINAL NEGATIVE REPORT     Performed at [facility]   Report Status PENDING   Incomplete  SURGICAL PCR SCREEN     Status: Abnormal   Collection Time    05/23/13  6:10 AM      Result Value Range Status   MRSA, PCR POSITIVE (*) NEGATIVE Final   Staphylococcus aureus POSITIVE (*) NEGATIVE Final   Comment:            The Xpert SA Assay (FDA     approved for NASAL specimens     in patients over 22 years of age),     is one component of     a comprehensive surveillance     program.  Test performance has     been validated by [employer] for patients greater     than or equal to 83 year old.     It is not intended     to diagnose infection nor to     guide or monitor treatment.    Studies/Results: Reviewed and documented in Electronic Record Medications: I have reviewed the patient's current medications. Scheduled Meds:  atorvastatin  40 mg Oral QHS   brimonidine  1 drop Both Eyes BID   Chlorhexidine Gluconate Cloth  6 each Topical Q0600   DAPTOmycin (CUBICIN)  IV  500 mg Intravenous Q48H   enoxaparin (LOVENOX) injection  30 mg Subcutaneous Q24H   ezetimibe  10 mg Oral QHS   insulin aspart  0-5 Units Subcutaneous TID WC   insulin aspart  4 Units Subcutaneous TID WC   insulin glargine  6 Units Subcutaneous BID   isosorbide mononitrate  30  mg Oral QHS   latanoprost  1 drop Both Eyes QHS   levothyroxine  125 mcg Oral QAC breakfast   metoprolol succinate  25 mg Oral Daily   mupirocin ointment  1 application Nasal BID   Continuous Infusions:  sodium chloride 50 mL/hr at 05/27/13  1503   PRN Meds:.acetaminophen, acetaminophen, alum & mag hydroxide-simeth, docusate sodium, guaiFENesin-dextromethorphan, hydrALAZINE, labetalol, metoprolol, morphine injection, ondansetron (ZOFRAN) IV, ondansetron, oxyCODONE, phenol  Assessment/Plan: Ms Henegar is a 67 yo with poorly controlled Type I DM, CAD s/p CABG, PVD s/p L fem to anterior tibial artery bypass, L forefoot amputation in 2011, and CKD who presents from clinic with right foot ulcer evaluation. MRI indicated OM.   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  --zofran and oxycodone prn post op pain and nausea  -- Appreciate vascular input -  Appreciate ID consult  -- Blood culture x 2 pending, bone Cx and path pending   -- Vancomycin stopped due to worsening of kidney function--Trough level 21.9, 8/13  -- Daptomycin per ID  #PVD - Angiogram 8/11    05/27/13 S/P atherectomy and angioplasty of the right superficial femoral artery    05/27/13 S/P Ray amputation right fifth toe - Appreciate VVS input!! - defer to VVS for management   # Hyponatremia , corrected  - follow up BMP   # AKI on CKD (Stage III)--better  Recent Labs Lab 05/25/13 0545 05/25/13 1540 05/26/13 0440 05/27/13 0535 05/28/13 0515  CREATININE 1.80* 1.72* 1.85* 1.70* 1.51*       likely multifactorial. Medication induced vs pre renal, and the contrast that she received on 05/25/13 would complicate it as well  - D/C all Nephrotoxic medications  - Monitor the fluid status closely-- Last Echo 11/14/11: EF 30-35%, grade 2 diastolic dysfunction), moderate to severe AS.  - BMP daily  # Uncontrolled DM type 1  Patient reports that she has had hypoglycemia symptoms whenever her CBG is < 120. She would like her goal of FSBS to be 130-150. Given her GFR and weight, her calculated basal requirement is 10-12 daily.   - Lantus 6 BID--FSBS 152  - Novolog prandial coverage 4 TID  -  Customized scale is ordered--sensitivity factor is 60 now.   # Anemia  Recent Labs Lab 05/26/13 0440 05/27/13 0535 05/28/13 0515  HGB 11.0* 11.1* 10.6*  HCT 31.7* 32.1* 30.6*  WBC 8.1 7.3 9.5  PLT 206 198 214   - patient was admitted with HH 12.6/36.8 - it remains on 11.1/32.1, Likely associated with dilutional effect from her IVF infusion since admission. - follow CBC since she is s/p Ray amputation right fifth toe on 05/27/13.   #CHF - Stable.  - Appreciate cardiology consult  - Continue home mdur, metoprolol  - D/C Lasix due to AKI  - Seen by Cards over weekend, appreciate their input, listed patient as moderate risk for surgery  - F/U ECHO ordered by cards   #HLD - continue home Lipitor, ezetimibe   #Hypothyroidism - continue home synthroid   #DVT PPX - Lovenox per VVS     Dispo: Disposition is deferred at this time, awaiting improvement of current medical problems.  Anticipated discharge in approximately 2-3 day(s).   The patient does have a current PCP [name], MD) and does need an [facility] hospital follow-up appointment after discharge.  The patient does not have transportation limitations that hinder transportation to clinic appointments.  [name]  Services Needed at time of discharge: Y = Yes, Blank = No PT:   OT:   RN:   Equipment:   Other:     LOS: 6 days   Freddi Che, [facility] MS4, Acting Intern  05/28/2013, 7:41 AM

## 2013-05-28 NOTE — Progress Notes (Signed)
I have seen the patient and reviewed the daily progress note by [name] 4 Hosie Spangle and discussed the care of the patient with them.  See below for documentation of my findings, assessment, and plans.  Subjective: POD# 1, patient states that her pain is better controlled today. Had some nausea last night, which was resolved by Zofran.  She is able to eat some of her breakfast this am.   Objective: Vital signs in last 24 hours: Filed Vitals:   05/27/13 1503 05/27/13 2123 05/28/13 0214 05/28/13 0620  BP: 139/60 143/53 132/55 135/50  Pulse: 78 76 67 63  Temp: 98 F (36.7 C) 97.9 F (36.6 C) 98.4 F (36.9 C) 98.1 F (36.7 C)  TempSrc: Oral Oral Oral Oral  Resp: 16 18 18 18   Height:      Weight:    167 lb 15.9 oz (76.2 kg)  SpO2: 98% 94% 93% 94%   Weight change: -1 lb 6.4 oz (-0.635 kg)  Intake/Output Summary (Last 24 hours) at 05/28/13 1112 Last data filed at 05/28/13 0925  Gross per 24 hour  Intake 2298.33 ml  Output    550 ml  Net 1748.33 ml   General:NAD  Lungs : CTAB, no crackles, wheezes  Heart: RRR, murmur II/VI unchanged  Abd: soft, BS x 4  LE; B/L LE erythema better. Right foot bandaged with clean dry bandage.   Lab Results: Reviewed and documented in Electronic Record Micro Results: Reviewed and documented in Electronic Record Studies/Results: Reviewed and documented in Electronic Record Medications: I have reviewed the patient's current medications. Scheduled Meds: . atorvastatin  40 mg Oral QHS  . brimonidine  1 drop Both Eyes BID  . Chlorhexidine Gluconate Cloth  6 each Topical Q0600  . DAPTOmycin (CUBICIN)  IV  500 mg Intravenous Q24H  . enoxaparin (LOVENOX) injection  30 mg Subcutaneous Q24H  . ezetimibe  10 mg Oral QHS  . insulin aspart  0-5 Units Subcutaneous TID WC  . insulin aspart  4 Units Subcutaneous TID WC  . insulin glargine  6 Units Subcutaneous BID  . isosorbide mononitrate  30 mg Oral QHS  . latanoprost  1 drop Both Eyes QHS  .  levothyroxine  125 mcg Oral QAC breakfast  . metoprolol succinate  25 mg Oral Daily  . mupirocin ointment  1 application Nasal BID   Continuous Infusions: . sodium chloride 50 mL/hr at 05/28/13 1030   PRN Meds:.acetaminophen, acetaminophen, alum & mag hydroxide-simeth, docusate sodium, guaiFENesin-dextromethorphan, hydrALAZINE, labetalol, metoprolol, morphine injection, ondansetron (ZOFRAN) IV, ondansetron, oxyCODONE, phenol Assessment/Plan:  [name] is a 67 yo with poorly controlled Type I DM, CAD s/p CABG, PVD s/p L fem to anterior tibial artery bypass, L forefoot amputation in 2011, and CKD who presents from clinic with right foot ulcer evaluation. MRI indicated OM.   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  -  Appreciate ID consult -  -- Blood culture x 2 pending  -- ABX    Zosyn<<8/9 stopped    Vancomycin<<8/9-8/11    Daptomycin<<8/13--   #PVD - Angiogram 8/11  05/27/13 S/P atherectomy and angioplasty of the right superficial femoral artery  05/27/13 S/P Ray amputation right fifth toe   - Appreciate VVS input!!  - defer to VVS for management   # Hyponatremia , corrected  - follow up BMP   # AKI on CKD (Stage III)--better  likely multifactorial. Medication induced vs pre renal, and the contrast that she received on 05/25/13 would complicate it as well.   Recent Labs Lab 05/25/13 0545 05/25/13 1540 05/26/13 0440 05/27/13 0535 05/28/13 0515  CREATININE 1.80* 1.72* 1.85* 1.70* 1.51*   - D/C all Nephrotoxic medications  - Monitor the fluid status closely-- Last Echo 11/14/11: EF 30-35%, grade 2 diastolic dysfunction), moderate to severe AS. - Gentle fluid replacement NS 50/h  - BMP daily   # Uncontrolled DM type 1  Patient reports that she has had hypoglycemia symptoms whenever her CBG is < 120. She would like her goal of FSBS to be 130-150. Given her  GFR and weight, her calculated basal requirement is 10-12 daily.   - Lantus 6 BID--FSBS 97  - Novolog prandial coverage 4 TID, 2 units with 1/2 tray - Customized scale is ordered--sensitivity factor is 60 now.   # Anemia   Recent Labs Lab 05/26/13 0440 05/27/13 0535 05/28/13 0515  HGB 11.0* 11.1* 10.6*  HCT 31.7* 32.1* 30.6*  WBC 8.1 7.3 9.5  PLT 206 198 214    - patient was admitted with HH 12.6/36.8  - it remains on 11.1/32.1, Likely associated with dilutional effect from her IVF infusion since admission.  - follow CBC since she is s/p Ray amputation right fifth toe on 05/27/13.   #CHF - Stable.  - Appreciate cardiology consult  - Continue home mdur, metoprolol  - D/C Lasix due to AKI  - Seen by Cards over weekend, appreciate their input, listed patient as moderate risk for surgery  - F/U ECHO ordered by cards   #HLD - continue home Lipitor, ezetimibe   #Hypothyroidism - continue home synthroid   #DVT PPX - Lovenox per VVS     Dispo: Disposition is deferred at this time, awaiting improvement of current medical problems.  Anticipated discharge in approximately  day(s).   The patient does have a current PCP [name], MD) and does need an [facility] hospital follow-up appointment after discharge.  The patient does not have transportation limitations that hinder transportation to clinic appointments.  .Services Needed at time of discharge: Y = Yes, Blank = No PT:   OT:   RN:   Equipment:   Other:     LOS: 6 days   [name] Query, MD 05/28/2013, 11:12 AM

## 2013-05-28 NOTE — Progress Notes (Signed)
Patient ID: [name], female   DOB: May 06, 1946, 67 y.o.   MRN: [id number] Comfortable. Reports moderate to severe pain at times regarding her fifth toe amputation. Controlled with pain meds.   Dressing intact in her right foot. Foot warm and well perfused.  Postop day 1 right fifth toe amputation and right superficial femoral artery atherectomy and balloon angioplasty. Will remove dressing Tamara. Patient can mobilize with Darco shoe.

## 2013-05-28 NOTE — Evaluation (Signed)
Physical Therapy Evaluation Patient Details Name: [name] MRN: [id number] DOB: October 27, 1945 Today's Date: 05/28/2013 Time: 1478-2956 PT Time Calculation (min): 23 min  PT Assessment / Plan / Recommendation History of Present Illness   67 year old female patient of 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  She also underwent transmetatarsal amputation which healed.  She was admitted to the hospital on 05/23/2013 with a worsening right foot wound that has been present for 3 months.  She thinks that it was the result of poorly fitting shoes.  Approximately one week ago, it became worse and began draining foul smelling fluid.  It became red and painful.  She reports subjective fevers.  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  ABI's in 12/2012 were 1.2 on the left and 0.82 on the right with monophasic waveforms bilaterally.  Pt is s/p R 5th toe amputation.  Clinical Impression  Pt admitted with above. Pt currently with functional limitations due to the deficits listed below (see PT Problem List). Pt will benefit from skilled PT to increase their independence and safety with mobility to allow discharge to the venue listed below. Pt would like PT in hospital to continue gait training and stairs prior to d/c home however declines HHPT at this time.     PT Assessment  Patient needs continued PT services    Follow Up Recommendations  Home health PT (however pt declines f/u at this time)    Does the patient have the potential to tolerate intense rehabilitation      Barriers to Discharge        Equipment Recommendations  None recommended by PT    Recommendations for Other Services     Frequency Min 4X/week    Precautions / Restrictions Precautions Required Braces or Orthoses: Other Brace/Splint Other Brace/Splint: Darco shoe Restrictions Other  Position/Activity Restrictions: WBAT with Darco shoe   Pertinent Vitals/Pain Pt reports pain better than this morning.  Positioned to comfort in recliner prior to leaving room.      Mobility  Bed Mobility Bed Mobility: Supine to Sit Supine to Sit: 6: Modified independent (Device/Increase time);HOB elevated Transfers Transfers: Sit to Stand;Stand to Sit Sit to Stand: With upper extremity assist;4: Min guard;From bed Stand to Sit: With upper extremity assist;4: Min guard;To chair/3-in-1 Details for Transfer Assistance: verbal cues for safe technique Ambulation/Gait Ambulation/Gait Assistance: 4: Min guard Ambulation Distance (Feet): 12 Feet Assistive device: Rolling walker Ambulation/Gait Assistance Details: pt with limited distance due to nausea, verbal cues for sequence, educated pt on heel WBing if pain too much with WBing (wearing Darco shoe) Gait Pattern: Step-to pattern;Decreased stance time - right Gait velocity: decreased    Exercises     PT Diagnosis: Difficulty walking;Acute pain  PT Problem List: Decreased mobility;Pain;Decreased knowledge of use of DME;Decreased activity tolerance PT Treatment Interventions:       PT Goals(Current goals can be found in the care plan section) Acute Rehab PT Goals Patient Stated Goal: to walk better and be able to perform steps to go home PT Goal Formulation: With patient Time For Goal Achievement: 06/04/13 Potential to Achieve Goals: Good  Visit Information  Last PT Received On: 05/28/13 Assistance Needed: +1 History of Present Illness:  67 year old female patient of 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  She also underwent transmetatarsal amputation which healed.  She was admitted to the hospital on 05/23/2013 with a worsening right foot wound that has been present for 3 months.  She thinks that it was the result of poorly fitting shoes.   Approximately one week ago, it became worse and began draining foul smelling fluid.  It became red and painful.  She reports subjective fevers.  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  ABI's in 12/2012 were 1.2 on the left and 0.82 on the right with monophasic waveforms bilaterally.  Pt is s/p R 5th toe amputation.       Prior Functioning  Home Living Family/patient expects to be discharged to:: Private residence Living Arrangements: Alone Available Help at Discharge: Neighbor Type of Home: House Home Access: Stairs to enter [employer] of Steps: 6 Entrance Stairs-Rails: Left;Right Home Layout: Able to live on main level with bedroom/bathroom;Two level Home Equipment: Walker - 4 wheels;Grab bars - toilet;Grab bars - tub/shower Additional Comments: Pt reports she usually goes up steps sideways with one rail Prior Function Level of Independence: Independent with assistive device(s) Communication Communication: No difficulties    Cognition  Cognition Arousal/Alertness: Awake/alert Behavior During Therapy: WFL for tasks assessed/performed Overall Cognitive Status: Within Functional Limits for tasks assessed    Extremity/Trunk Assessment Lower Extremity Assessment Lower Extremity Assessment: RLE deficits/detail;LLE deficits/detail;Overall WFL for tasks assessed RLE Deficits / Details: s/p 5th toe amp, ankle wrapped so NT  LLE Deficits / Details: old transmetatarsal amputation   Balance    End of Session PT - End of Session Activity Tolerance: Other (comment) (limited by nausea) Patient left: in chair;with call bell/phone within reach  GP     [facility] E 05/28/2013, 4:27 PM Zenovia Jarred, PT, DPT 05/28/2013 Pager: [phone]

## 2013-05-28 NOTE — Progress Notes (Signed)
[facility]  Day #2 daptomycin   Subjective: No specific complaints   Antibiotics:  Anti-infectives   Start     Dose/Rate Route Frequency Ordered Stop   05/28/13 2300  DAPTOmycin (CUBICIN) 500 mg in sodium chloride 0.9 % IVPB     500 mg 220 mL/hr over 30 Minutes Intravenous Every 24 hours 05/28/13 1048     05/28/13 1400  meropenem (MERREM) 1 g in sodium chloride 0.9 % 100 mL IVPB     1 g 200 mL/hr over 30 Minutes Intravenous Every 12 hours 05/28/13 1311     05/27/13 2200  DAPTOmycin (CUBICIN) 500 mg in sodium chloride 0.9 % IVPB  Status:  Discontinued     500 mg 220 mL/hr over 30 Minutes Intravenous Every 48 hours 05/27/13 1334 05/28/13 1048   05/23/13 2300  vancomycin (VANCOCIN) IVPB 1000 mg/200 mL premix  Status:  Discontinued     1,000 mg 200 mL/hr over 60 Minutes Intravenous Every 24 hours 05/23/13 0121 05/23/13 1642   05/23/13 2300  vancomycin (VANCOCIN) IVPB 1000 mg/200 mL premix  Status:  Discontinued     1,000 mg 200 mL/hr over 60 Minutes Intravenous Every 24 hours 05/23/13 1656 05/26/13 1001   05/23/13 0130  piperacillin-tazobactam (ZOSYN) IVPB 3.375 g  Status:  Discontinued     3.375 g 12.5 mL/hr over 240 Minutes Intravenous Every 8 hours 05/23/13 0120 05/23/13 1642   05/22/13 2230  vancomycin (VANCOCIN) 1,250 mg in sodium chloride 0.9 % 250 mL IVPB     1,250 mg 166.7 mL/hr over 90 Minutes Intravenous  Once 05/22/13 2226 05/23/13 0226   05/22/13 2230  ceFEPIme (MAXIPIME) 1 g in dextrose 5 % 50 mL IVPB  Status:  Discontinued     1 g 100 mL/hr over 30 Minutes Intravenous  Once 05/22/13 2226 05/23/13 0101      Medications: Scheduled Meds: . atorvastatin  40 mg Oral QHS  . brimonidine  1 drop Both Eyes BID  . Chlorhexidine Gluconate Cloth  6 each Topical Q0600  . DAPTOmycin (CUBICIN)  IV  500 mg Intravenous Q24H  . enoxaparin (LOVENOX) injection  30 mg Subcutaneous Q24H  . ezetimibe  10 mg Oral QHS  . insulin aspart  0-5 Units  Subcutaneous TID WC  . insulin aspart  4 Units Subcutaneous TID WC  . insulin glargine  6 Units Subcutaneous BID  . isosorbide mononitrate  30 mg Oral QHS  . latanoprost  1 drop Both Eyes QHS  . levothyroxine  125 mcg Oral QAC breakfast  . meropenem (MERREM) IV  1 g Intravenous Q12H  . metoprolol succinate  25 mg Oral Daily  . mupirocin ointment  1 application Nasal BID   Continuous Infusions: . sodium chloride 50 mL/hr at 05/28/13 1030   PRN Meds:.acetaminophen, acetaminophen, alum & mag hydroxide-simeth, docusate sodium, guaiFENesin-dextromethorphan, hydrALAZINE, labetalol, metoprolol, morphine injection, ondansetron (ZOFRAN) IV, ondansetron, oxyCODONE, phenol   Objective: Weight change: -1 lb 6.4 oz (-0.635 kg)  Intake/Output Summary (Last 24 hours) at 05/28/13 1434 Last data filed at 05/28/13 0925  Gross per 24 hour  Intake 1398.33 ml  Output    500 ml  Net 898.33 ml   Blood pressure 142/66, pulse 75, temperature 98.5 F (36.9 C), temperature source Oral, resp. rate 18, height 5\' 3"  (1.6 m), weight 167 lb 15.9 oz (76.2 kg), SpO2 96.00%. Temp:  [97.9 F (36.6 C)-98.5 F (36.9 C)] 98.5 F (36.9 C) (08/14 1423) Pulse Rate:  [63-78] 75 (08/14  1423) Resp:  [16-18] 18 (08/14 1423) BP: (132-143)/(50-66) 142/66 mmHg (08/14 1423) SpO2:  [93 %-98 %] 96 % (08/14 1423) Weight:  [167 lb 15.9 oz (76.2 kg)] 167 lb 15.9 oz (76.2 kg) (08/14 1610)  Physical Exam: General: aox3 Heent:dried, crackes lips (she though from abx) CV: rrr no mgr GI: nondistended Ext: wrapped in dressing   Lab Results:  Recent Labs  05/27/13 0535 05/28/13 0515  WBC 7.3 9.5  HGB 11.1* 10.6*  HCT 32.1* 30.6*  PLT 198 214    BMET  Recent Labs  05/27/13 0535 05/28/13 0515  NA 135 135  K 4.1 4.2  CL 103 106  CO2 25 20  GLUCOSE 152* 100*  BUN 39* 34*  CREATININE 1.70* 1.51*  CALCIUM 8.6 8.5    Micro Results: Recent Results (from the past 240 hour(s))  CULTURE, BLOOD (ROUTINE X 2)      Status: None   Collection Time    05/22/13 11:59 PM      Result Value Range Status   Specimen Description BLOOD RIGHT ARM   Final   Special Requests BOTTLES DRAWN AEROBIC ONLY [facility]   Final   Culture  Setup Time     Final   Value: 05/23/2013 11:17     Performed at [facility]   Culture     Final   Value:        BLOOD CULTURE RECEIVED NO GROWTH TO DATE CULTURE WILL BE HELD FOR 5 DAYS BEFORE ISSUING A FINAL NEGATIVE REPORT     Performed at [facility]   Report Status PENDING   Incomplete  CULTURE, BLOOD (ROUTINE X 2)     Status: None   Collection Time    05/23/13 12:15 AM      Result Value Range Status   Specimen Description BLOOD LEFT HAND   Final   Special Requests BOTTLES DRAWN AEROBIC ONLY [facility]   Final   Culture  Setup Time     Final   Value: 05/23/2013 11:16     Performed at [facility]   Culture     Final   Value:        BLOOD CULTURE RECEIVED NO GROWTH TO DATE CULTURE WILL BE HELD FOR 5 DAYS BEFORE ISSUING A FINAL NEGATIVE REPORT     Performed at [facility]   Report Status PENDING   Incomplete  SURGICAL PCR SCREEN     Status: Abnormal   Collection Time    05/23/13  6:10 AM      Result Value Range Status   MRSA, PCR POSITIVE (*) NEGATIVE Final   Staphylococcus aureus POSITIVE (*) NEGATIVE Final   Comment:            The Xpert SA Assay (FDA     approved for NASAL specimens     in patients over 77 years of age),     is one component of     a comprehensive surveillance     program.  Test performance has     been validated by [employer] for patients greater     than or equal to 37 year old.     It is not intended     to diagnose infection nor to     guide or monitor treatment.    Studies/Results: No results found.    Assessment/Plan: [name] is a 66 y.o. female with   67 yo with DM, PVD and osteomyelitis  now with AKI  #1 Osteomyelitis:  No cultures appear to have been done in OR so we are stuck with empiric  therapy  --agree with daptomycin --would add meropenem --plan on continuing BOTH drugs for 4 weeks with fu in our clinic pror to stopping abx  #2 Screening: HIV neg, and check  Hep C    I arrange followup in our clinic. Otherwise we'll sign off for now please call with further questions   LOS: 6 days   [name] 05/28/2013, 2:34 PM

## 2013-05-28 NOTE — Progress Notes (Signed)
OT Cancellation Note  Patient Details Name: [name] MRN: [id number] DOB: 1945/12/06   Cancelled Treatment:    Reason Eval/Treat Not Completed: Pain limiting ability to participate - will reattempt this pm as caseload allows, or tomorrow  Boykin Reaper [phone] 05/28/2013, 11:47 AM

## 2013-05-28 NOTE — Progress Notes (Addendum)
ANTIBIOTIC CONSULT NOTE - FOLLOW UP  Pharmacy Consult:  [facility] Indication:  Osteomyelitis s/p toe amputation  Allergies  Allergen Reactions  . Adhesive [Tape] Other (See Comments)    "old [employer] adhesive tape; takes my skin off; can use paper tape"  . Bactrim [Sulfamethoxazole W-Trimethoprim]     Hyperkalemia and Increased creatinine  . Cephalexin Swelling and Rash  . Ciprofloxacin Swelling and Rash    Patient Measurements: Height: 5\' 3"  (160 cm) Weight: 167 lb 15.9 oz (76.2 kg) IBW/kg (Calculated) : 52.4  Vital Signs: Temp: 98.1 F (36.7 C) (08/14 0620) Temp src: Oral (08/14 0620) BP: 135/50 mmHg (08/14 0620) Pulse Rate: 63 (08/14 0620) Intake/Output from previous day: 08/13 0701 - 08/14 0700 In: 2058.3 [P.O.:120; I.V.:1938.3] Out: 550 [Urine:500; Blood:50] Intake/Output from this shift: Total I/O In: 240 [P.O.:240] Out: -   Labs:  Recent Labs  05/26/13 0440 05/27/13 0535 05/28/13 0515  WBC 8.1 7.3 9.5  HGB 11.0* 11.1* 10.6*  PLT 206 198 214  CREATININE 1.85* 1.70* 1.51*   Estimated Creatinine Clearance: 35.3 ml/min (by C-G formula based on Cr of 1.51).  Recent Labs  05/26/13 2330  VANCOTROUGH 21.9*     Microbiology: Recent Results (from the past 720 hour(s))  CULTURE, BLOOD (ROUTINE X 2)     Status: None   Collection Time    05/22/13 11:59 PM      Result Value Range Status   Specimen Description BLOOD RIGHT ARM   Final   Special Requests BOTTLES DRAWN AEROBIC ONLY [facility]   Final   Culture  Setup Time     Final   Value: 05/23/2013 11:17     Performed at [facility]   Culture     Final   Value:        BLOOD CULTURE RECEIVED NO GROWTH TO DATE CULTURE WILL BE HELD FOR 5 DAYS BEFORE ISSUING A FINAL NEGATIVE REPORT     Performed at [facility]   Report Status PENDING   Incomplete  CULTURE, BLOOD (ROUTINE X 2)     Status: None   Collection Time    05/23/13 12:15 AM      Result Value Range Status   Specimen Description BLOOD  LEFT HAND   Final   Special Requests BOTTLES DRAWN AEROBIC ONLY [facility]   Final   Culture  Setup Time     Final   Value: 05/23/2013 11:16     Performed at [facility]   Culture     Final   Value:        BLOOD CULTURE RECEIVED NO GROWTH TO DATE CULTURE WILL BE HELD FOR 5 DAYS BEFORE ISSUING A FINAL NEGATIVE REPORT     Performed at [facility]   Report Status PENDING   Incomplete  SURGICAL PCR SCREEN     Status: Abnormal   Collection Time    05/23/13  6:10 AM      Result Value Range Status   MRSA, PCR POSITIVE (*) NEGATIVE Final   Staphylococcus aureus POSITIVE (*) NEGATIVE Final   Comment:            The Xpert SA Assay (FDA     approved for NASAL specimens     in patients over 32 years of age),     is one component of     a comprehensive surveillance     program.  Test performance has     been validated by [employer]  for patients greater     than or equal to 1 year old.     It is not intended     to diagnose infection nor to     guide or monitor treatment.      Assessment: 67 YOF initially started on vancomycin for right foot osteomyelitis and antibiotic was changed to daptomycin due to worsening renal function.  Her renal function is improving.  She is s/p toe amputation, POD#1.  Vanc 8/8 >> 8/12 Zosyn 8/8 >> 8/9 Cubicin 8/13 >>  8/8 Bldx2 - NGTD 8/9 MRSA PCR - positive  8/12 VT = 21.9 mcg/mL (1g q24, SCr 1.7)   Goal of Therapy:  Clearance of infection   Plan:  - Change Cubicin to 500mg  IV Q24H - F/u renal fxn, C&S, clinical status - CK tomorrow AM and weekly thereafter (also on statin) - Consider increasing Lovenox to 40mg  SQ Q24H as patient's renal function is improving    Mayukha Symmonds D. Laney Potash, PharmD, BCPS Pager:  [phone] 05/28/2013, 10:47 AM    =================================  Addendum: Add meropenem to broaden coverage as biopsy not obtained.   Plan: - Merrem 1gm IV Q12H - Monitor renal fxn, clinical course     Lonya Johannesen D.  Laney Potash, PharmD, BCPS Pager:  [phone] 05/28/2013, 1:09 PM

## 2013-05-29 ENCOUNTER — Inpatient Hospital Stay (HOSPITAL_COMMUNITY): Payer: Medicare Other

## 2013-05-29 DIAGNOSIS — I369 Nonrheumatic tricuspid valve disorder, unspecified: Secondary | ICD-10-CM

## 2013-05-29 LAB — BASIC METABOLIC PANEL
BUN: 37 mg/dL — ABNORMAL HIGH (ref 6–23)
CO2: 20 mEq/L (ref 19–32)
Chloride: 104 mEq/L (ref 96–112)
Creatinine, Ser: 1.74 mg/dL — ABNORMAL HIGH (ref 0.50–1.10)
GFR calc Af Amer: 34 mL/min — ABNORMAL LOW (ref >90)
Potassium: 4.5 mEq/L (ref 3.5–5.1)

## 2013-05-29 LAB — CULTURE, BLOOD (ROUTINE X 2): Culture: NO GROWTH

## 2013-05-29 LAB — GLUCOSE, CAPILLARY
Glucose-Capillary: 115 mg/dL — ABNORMAL HIGH (ref 70–99)
Glucose-Capillary: 140 mg/dL — ABNORMAL HIGH (ref 70–99)
Glucose-Capillary: 73 mg/dL (ref 70–99)

## 2013-05-29 LAB — CBC
HCT: 31.5 % — ABNORMAL LOW (ref 36.0–46.0)
Hemoglobin: 10.5 g/dL — ABNORMAL LOW (ref 12.0–15.0)
MCV: 92.1 fL (ref 78.0–100.0)
RBC: 3.42 MIL/uL — ABNORMAL LOW (ref 3.87–5.11)
WBC: 9.7 10*3/uL (ref 4.0–10.5)

## 2013-05-29 MED ORDER — LINEZOLID 600 MG PO TABS
600.0000 mg | ORAL_TABLET | Freq: Two times a day (BID) | ORAL | Status: DC
  Administered 2013-05-30 – 2013-06-02 (×8): 600 mg via ORAL
  Filled 2013-05-29 (×13): qty 1

## 2013-05-29 MED ORDER — METOCLOPRAMIDE HCL 5 MG PO TABS
5.0000 mg | ORAL_TABLET | Freq: Three times a day (TID) | ORAL | Status: DC
  Filled 2013-05-29 (×4): qty 1

## 2013-05-29 MED ORDER — DOCUSATE SODIUM 50 MG PO CAPS
50.0000 mg | ORAL_CAPSULE | Freq: Two times a day (BID) | ORAL | Status: DC
  Administered 2013-05-29 – 2013-06-02 (×8): 50 mg via ORAL
  Filled 2013-05-29 (×10): qty 1

## 2013-05-29 NOTE — Progress Notes (Signed)
  Date: 05/29/2013  Patient name: [name]  Medical record number: [id number]  Date of birth: 10-12-1946   This patient has been seen and the plan of care was discussed with the house staff. Please see their note for complete details. I concur with their findings with the following additions/corrections:  Appreciate ID input. At this time, due to current tx with daptomycin, elevated CPK and current statin therapy, I would stop her statin and zetia as well. 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  Monitor her UOP and Cr daily. I would dose her medications for a CrCl less than 30 for now. She is interested in [name] discussing her options with her, as she is most comfortable with him, we will have him come by. I explained to her the likely need for continued IV abx therapy, which will require a PICC vs hickmann catheter. I would be hesitant to put a PICC in her due to her CKD 3, so I would prefer a more proximal line. Consult nephro for recs. She states she has had poor experiences with PICC lines. She is interested in oral therapy instead of IV abx, but I will let her discuss this with both Dr. Daiva Eves and [name]. BG is better controlled, she is happy with current treatment. All of her questions were answered. We discussed all aspects of her care and all medications changes and all alternative with treatments with [name]. She has been thoroughly updated on the current medical/surgical plans.  [name], DO 05/29/2013, 2:43 PM

## 2013-05-29 NOTE — Evaluation (Signed)
Occupational Therapy Evaluation Patient Details Name: [name] MRN: [id number] DOB: Jun 18, 1946 Today's Date: 05/29/2013 Time: 1308-6578 OT Time Calculation (min): 26 min  OT Assessment / Plan / Recommendation History of present illness  67 year old female patient of 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  She also underwent transmetatarsal amputation which healed.  She was admitted to the hospital on 05/23/2013 with a worsening right foot wound that has been present for 3 months.  She thinks that it was the result of poorly fitting shoes.  Approximately one week ago, it became worse and began draining foul smelling fluid.  It became red and painful.  She reports subjective fevers.  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  ABI's in 12/2012 were 1.2 on the left and 0.82 on the right with monophasic waveforms bilaterally.  Pt is s/p R 5th toe amputation.   Clinical Impression   Pt presents to OT with generalized weakness, and fatigues rather quickly with activity.   Pt encouraged to continue to ambulate with nursing, to ambulate to BR (with staff) rather than using BSC, and to perform as much of her ADL as possible in the hospital to increase her endurance/activity tolerance.  She will benefit from continued OT to maximize safety and independence with BADLs to allow her to return home at modified independent level with intermittent assist/supervision from friends    OT Assessment  Patient needs continued OT Services    Follow Up Recommendations  Home health OT;Supervision - Intermittent    Barriers to Discharge Decreased caregiver support    Equipment Recommendations  None recommended by OT    Recommendations for Other Services    Frequency  Min 2X/week    Precautions / Restrictions Precautions Precautions: Fall Required Braces or Orthoses:  Other Brace/Splint Other Brace/Splint: Darco shoe Restrictions Other Position/Activity Restrictions: WBAT with Darco shoe   Pertinent Vitals/Pain     ADL  Eating/Feeding: Independent Where Assessed - Eating/Feeding: Chair Grooming: Wash/dry hands;Wash/dry face;Teeth care;Brushing hair;Min guard Where Assessed - Grooming: Supported standing Upper Body Bathing: Set up Where Assessed - Upper Body Bathing: Unsupported sitting Lower Body Bathing: Moderate assistance Where Assessed - Lower Body Bathing: Supported sit to stand Upper Body Dressing: Set up Where Assessed - Upper Body Dressing: Unsupported sitting Lower Body Dressing: Maximal assistance Where Assessed - Lower Body Dressing: Supported sit to stand Toilet Transfer: Hydrographic surveyor Method: Sit to stand;Stand pivot Acupuncturist: Comfort height toilet Toileting - Architect and Hygiene: Min guard Where Assessed - Engineer, mining and Hygiene: Standing Equipment Used: Rolling walker;Other (comment) (Darco shoe) Transfers/Ambulation Related to ADLs: Pt required min guard assist with RW ADL Comments: Pt unable to access feet for LB ADLs.  She reports she has reacher at home, but has never used it for ADLs.  Pt fatigues rapidly with activity.  Encouraged another walk this pm, her to perform BADLs this weekend as much as she can do, and to ambulate to BR for toileting - no BSC    OT Diagnosis: Generalized weakness  OT Problem List: Decreased strength;Decreased activity tolerance;Impaired balance (sitting and/or standing);Decreased knowledge of use of DME or AE;Cardiopulmonary status limiting activity OT Treatment Interventions: Self-care/ADL training;Energy conservation;DME and/or AE instruction;Therapeutic activities;Patient/family education;Balance training   OT Goals(Current goals can be found in the care plan section) Acute Rehab OT Goals Patient Stated Goal: to  walk better and be  able to perform steps to go home OT Goal Formulation: With patient Time For Goal Achievement: 06/05/13 Potential to Achieve Goals: Good ADL Goals Pt Will Perform Grooming: with modified independence;standing Pt Will Perform Upper Body Bathing: with modified independence;sitting;standing Pt Will Perform Lower Body Bathing: with modified independence;sit to/from stand;with adaptive equipment Pt Will Perform Upper Body Dressing: with modified independence;sitting Pt Will Perform Lower Body Dressing: with modified independence;sit to/from stand;with adaptive equipment Pt Will Transfer to Toilet: with modified independence;ambulating;regular height toilet;grab bars Pt Will Perform Toileting - Clothing Manipulation and hygiene: with modified independence;sit to/from stand Pt Will Perform Tub/Shower Transfer: Shower transfer;with modified independence;shower seat;ambulating;rolling walker Additional ADL Goal #1: Pt will perform morning BADL routine with no more than one rest break   Visit Information  Last OT Received On: 05/29/13 Assistance Needed: +1 History of Present Illness:  67 year old female patient of 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  She also underwent transmetatarsal amputation which healed.  She was admitted to the hospital on 05/23/2013 with a worsening right foot wound that has been present for 3 months.  She thinks that it was the result of poorly fitting shoes.  Approximately one week ago, it became worse and began draining foul smelling fluid.  It became red and painful.  She reports subjective fevers.  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  ABI's in 12/2012 were 1.2 on the left and 0.82 on the right with monophasic waveforms bilaterally.  Pt is s/p R 5th toe amputation.       Prior Functioning     Home Living Family/patient expects to  be discharged to:: Private residence Living Arrangements: Alone Available Help at Discharge: Neighbor Type of Home: House Home Access: Stairs to enter [employer] of Steps: 6 Entrance Stairs-Rails: Left;Right Home Layout: Able to live on main level with bedroom/bathroom;Two level Home Equipment: Walker - 4 wheels;Shower seat Additional Comments: Pt reports she usually goes up steps sideways with one rail Prior Function Level of Independence: Independent with assistive device(s) Communication Communication: No difficulties         Vision/Perception     Cognition  Cognition Arousal/Alertness: Awake/alert Behavior During Therapy: WFL for tasks assessed/performed Overall Cognitive Status: Within Functional Limits for tasks assessed    Extremity/Trunk Assessment Upper Extremity Assessment Upper Extremity Assessment: Generalized weakness Lower Extremity Assessment Lower Extremity Assessment: Defer to PT evaluation     Mobility Bed Mobility Bed Mobility: Supine to Sit;Sitting - Scoot to Edge of Bed Supine to Sit: 6: Modified independent (Device/Increase time);HOB elevated Sitting - Scoot to Edge of Bed: 6: Modified independent (Device/Increase time) Transfers Transfers: Sit to Stand;Stand to Sit Sit to Stand: 5: Supervision;From bed Stand to Sit: 5: Supervision;To bed     Exercise     Balance     End of Session OT - End of Session Equipment Utilized During Treatment: Rolling walker Activity Tolerance: Patient limited by fatigue Patient left: in bed;with call bell/phone within reach  GO     Mikell Camp M 05/29/2013, 3:41 PM

## 2013-05-29 NOTE — Progress Notes (Signed)
Patient ID: [name], female   DOB: 27-Mar-1946, 67 y.o.   MRN: [id number] Dressing removed from right foot. Right ray amputation looks quite good with the skin edges viable. Some bloody drainage.  Agree with 4 weeks of antibiotic therapy as directed by infectious disease. Will check wound again in 48 hours

## 2013-05-29 NOTE — Progress Notes (Signed)
Physical Therapy Treatment Patient Details Name: [name] MRN: [id number] DOB: Jul 26, 1946 Today's Date: 05/29/2013 Time: 4098-1191 PT Time Calculation (min): 24 min  PT Assessment / Plan / Recommendation  History of Present Illness  67 year old female patient of 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  She also underwent transmetatarsal amputation which healed.  She was admitted to the hospital on 05/23/2013 with a worsening right foot wound that has been present for 3 months.  She thinks that it was the result of poorly fitting shoes.  Approximately one week ago, it became worse and began draining foul smelling fluid.  It became red and painful.  She reports subjective fevers.  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  ABI's in 12/2012 were 1.2 on the left and 0.82 on the right with monophasic waveforms bilaterally.  Pt is s/p R 5th toe amputation.   PT Comments   Patient initially hesitant in working with therapy stating she had been up all day. Did end up walking after some encouragement. Patient limited by fatigue and had to take several resting breaks throughout. Spent time discussing safety and energy conservation at home. Patient will need to practice steps prior to DC home.   Follow Up Recommendations  Home health PT     Does the patient have the potential to tolerate intense rehabilitation     Barriers to Discharge        Equipment Recommendations  None recommended by PT    Recommendations for Other Services    Frequency Min 4X/week   Progress towards PT Goals Progress towards PT goals: Progressing toward goals  Plan Current plan remains appropriate    Precautions / Restrictions Precautions Other Brace/Splint: Darco shoe Restrictions Other Position/Activity Restrictions: WBAT with Darco shoe   Pertinent Vitals/Pain no apparent  distress    Mobility  Bed Mobility Supine to Sit: 6: Modified independent (Device/Increase time);HOB elevated Transfers Sit to Stand: 5: Supervision;From bed Stand to Sit: 5: Supervision;To bed Details for Transfer Assistance: verbal cues for safe technique Ambulation/Gait Ambulation/Gait Assistance: 4: Min guard Ambulation Distance (Feet): 30 Feet Assistive device: Rolling walker Ambulation/Gait Assistance Details: Several standing rest breaks due to fatigue. Patient uses rollater at home and not a fan of the RW.  Gait Pattern: Step-to pattern;Decreased stance time - right Gait velocity: decreased    Exercises     PT Diagnosis:    PT Problem List:   PT Treatment Interventions:     PT Goals (current goals can now be found in the care plan section)    Visit Information  Last PT Received On: 05/29/13 Assistance Needed: +1 History of Present Illness:  67 year old female patient of 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  She also underwent transmetatarsal amputation which healed.  She was admitted to the hospital on 05/23/2013 with a worsening right foot wound that has been present for 3 months.  She thinks that it was the result of poorly fitting shoes.  Approximately one week ago, it became worse and began draining foul smelling fluid.  It became red and painful.  She reports subjective fevers.  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  ABI's in 12/2012 were 1.2 on the left and 0.82 on the right  with monophasic waveforms bilaterally.  Pt is s/p R 5th toe amputation.    Subjective Data      Cognition  Cognition Arousal/Alertness: Awake/alert Behavior During Therapy: WFL for tasks assessed/performed Overall Cognitive Status: Within Functional Limits for tasks assessed    Balance     End of Session PT - End of Session Equipment Utilized  During Treatment: Gait belt Activity Tolerance: Patient limited by fatigue Patient left: in bed;with call bell/phone within reach   GP     Fredrich Birks 05/29/2013, 11:43 AM 05/29/2013 Fredrich Birks PTA [phone] pager [phone] office

## 2013-05-29 NOTE — Consult Note (Signed)
Referring Provider: No ref. provider found Primary Care Physician:  [name], MD Primary Nephrologist:    Reason for Consultation:  Chronic renal disease and anemia. Acute on Chronic changes.  HPI:67 year old female patient of [name] who is status post left femoral to anterior tibial artery bypass with translocated non-reversed vein in July 2010. She also underwent transmetatarsal amputation which healed. She was admitted to the hospital on 05/23/2013 with a worsening right foot wound that has been present for 3 months.Pt is s/p R 5th toe amputation. post op day 2, -amputation and angioplasty without stent SFA 8/14. Vancomycin stopped due to worsening of kidney function--Trough level 21.9, 8/13 .   Creatinine  8/15  1.7                    8/14 1.5         8/13  1.7        vanc trough 21         8/12  1.85                    8/11   1.7         8/10   1.54                    8/9     1.36  Baseline 1.3  Ray amputation  8/13            Past Medical History  Diagnosis Date  . HTN (hypertension)   . Hypothyroidism   . History of recurrent UTIs   . CAD (coronary artery disease)     s/p CABG x3 in 1993; last cath in 2010  . Scleroderma   . Bilateral carpal tunnel syndrome 1970s  . 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 Followed by [name]  . Claudication   . Aortic stenosis     0.8cm2 on echo in 10/2011  . Hyperlipidemia     on Lipitor  . Cat bite 2009    with MRSA; required I & D  . Heart murmur   . Blood transfusion     no reaction from transfusion; "when I had heart surgery" (08/28/2012)  . GERD (gastroesophageal reflux disease)   . CHF (congestive heart failure) Aug. 2012    Echo 10/2011: Mild LVH, EF 30%, apex akinetic, septal HK, grade 2 diastolic dysfunction, or functionally bicuspid aortic valve, mild aortic stenosis by gradient, severe by calculated AVA-suspect A. Aortic stenosis probably  moderate, trivial MR, mild LAE, mild RAE.   [name] Cellulitis   . Anemia   . B12 deficiency anemia     B 12 injection "monthly since 05/2012" (08/28/2012)  . Iron deficiency anemia 06/13/2012    "iron infusion" (08/28/2012  . Anginal pain     "history; not currently" (08/28/2012)  . Myocardial infarction 1986    "silent"  . History of bronchitis     "I've had it twice in my life" (08/28/2012)  . Exertional dyspnea     "because of the heart failure" (08/28/2012)  . Type 1 diabetes 10/1949  . Stomach ulcer 1981?    "on Tagament for ~ 1 yr" (08/28/2012)  . Seizure     ? seizure like event in 2010; workup included stress testing which led to repeat cath.   . Arthritis     "hands, hips, all over" (08/28/2012  . Osteoarthritis  of both feet   . Breast cancer     left  . Cellulitis June 2013, Nov. 2013 and Feb. 14, 2014    Left leg    Past Surgical History  Procedure Laterality Date  . Carpal tunnel release  ~ 1970's    bilaterally  . Mastectomy  11/1982    Left Breast  . Vitrectomy  1990's-2004    "both eyes; I've had total of 4"  . Tubal ligation  1984  . Tonsillectomy  1952    "?adenoids"   . Appendectomy  1991  . Femoral bypass      left leg "related to transmetatarsal amputation" (08/28/2012)  . Breast biopsy with sentinel lymph node biopsy and needle localization  1984  . Coronary artery bypass graft  1992    LIMA to LAD, SVG to distal LCX & SVG to Marginal  . Cardiac catheterization  2010    LIMA to LAD patent yet with occluded distal LAD after graft insertion & retrograde is occluded. 3-4 septal perforators arise &are patent. SVG to a large marginal patent; SVG presumably to the distal dominant LCX is occluded, moderately high grade stenosis of the AV circumflex, but with distal stenoses involving a severely diseased marginal branch not amenable  to PCI  nor is inferior branch   . Cardiac catheterization  10/2011    "unsuccessful attempt of stenting" (08/28/2012)  . Cardiac  catheterization  1992; 09/2011  . Breast biopsy    . Trigger finger release  1980's    right thumb  . Incision and drainage of wound  ~ 1967    "infection in left foot" (08/28/2012)  . Incision and drainage of wound  2009    "3 ORs on my right hand after cat bite" (08/28/2012)  . 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 Cataract extraction w/ intraocular lens  implant, bilateral  1990's  . Amputation Right 05/27/2013    Procedure: AMPUTATION DIGIT FIFTH TOE;  Surgeon: Larina Earthly, MD;  Location: [facility] OR;  Service: Vascular;  Laterality: Right;    Prior to Admission medications   Medication Sig Start Date End Date Taking? Authorizing Provider  acetaminophen (TYLENOL) 325 MG tablet Take 650 mg by mouth every 6 (six) hours as needed for fever.   Yes Historical Provider, MD  aspirin EC 325 MG EC tablet Take 325 mg by mouth daily.    Yes Historical Provider, MD  atorvastatin (LIPITOR) 40 MG tablet Take 40 mg by mouth at bedtime.   Yes Historical Provider, MD  brimonidine (ALPHAGAN) 0.2 % ophthalmic solution Place 1 drop into both eyes 2 (two) times daily.     Yes Historical Provider, MD  cyanocobalamin (,VITAMIN B-12,) 1000 MCG/ML injection Inject 1,000 mcg into the muscle every 30 (thirty) days.   Yes Historical Provider, MD  docusate sodium (COLACE) 50 MG capsule Take by mouth 2 (two) times daily.   Yes Historical Provider, MD  ezetimibe (ZETIA) 10 MG tablet Take 10 mg by mouth at bedtime.   Yes Historical Provider, MD  Ferrous Sulfate (SLOW RELEASE IRON) 50 MG TBCR Take 50 mg by mouth daily.    Yes Historical Provider, MD  furosemide (LASIX) 20 MG tablet Take 20-60 mg by mouth daily.   Yes Historical Provider, MD  ibuprofen (ADVIL,MOTRIN) 200 MG tablet Take 200 mg by mouth every 6 (six) hours as needed for pain or fever.   Yes Historical Provider, MD  insulin glargine (LANTUS)  100 UNIT/ML injection Inject 10-22 Units into the skin 2 (two) times daily. Take up to 22  units subcutaneously each morning and 10 units each evening.   Yes Historical Provider, MD  insulin lispro (HUMALOG PEN) 100 UNIT/ML injection Inject 0-10 Units into the skin 4 (four) times daily. Per sliding scale DO NOT GIVE WITHOUT CONSULTING PATIENT ON HER SLIDING SCALE   Yes Historical Provider, MD  isosorbide mononitrate (IMDUR) 30 MG 24 hr tablet Take 30 mg by mouth at bedtime.    Yes Historical Provider, MD  latanoprost (XALATAN) 0.005 % ophthalmic solution Place 1 drop into both eyes at bedtime.     Yes Historical Provider, MD  levothyroxine (SYNTHROID, LEVOTHROID) 125 MCG tablet Take 125 mcg by mouth daily.    Yes Historical Provider, MD  metoprolol succinate (TOPROL-XL) 25 MG 24 hr tablet Take 25 mg by mouth daily.   Yes Historical Provider, MD    Current Facility-Administered Medications  Medication Dose Route Frequency Provider Last Rate Last Dose  . 0.9 %  sodium chloride infusion   Intravenous Continuous Nada Libman, MD 50 mL/hr at 05/28/13 1030    . acetaminophen (TYLENOL) tablet 325-650 mg  325-650 mg Oral Q4H PRN Nada Libman, MD   650 mg at 05/27/13 2154   Or  . acetaminophen (TYLENOL) suppository 325-650 mg  325-650 mg Rectal Q4H PRN Nada Libman, MD      . alum & mag hydroxide-simeth (MAALOX/MYLANTA) 200-200-20 MG/5ML suspension 15-30 mL  15-30 mL Oral Q2H PRN Nada Libman, MD      . brimonidine (ALPHAGAN) 0.2 % ophthalmic solution 1 drop  1 drop Both Eyes BID Genelle Gather, MD      . Chlorhexidine Gluconate Cloth 2 % PADS 6 each  6 each Topical Q0600 [name], DO   6 each at 05/29/13 [phone]  . DAPTOmycin (CUBICIN) 500 mg in sodium chloride 0.9 % IVPB  500 mg Intravenous Q24H Thuy Dien Dang, RPH   500 mg at 05/28/13 2357  . docusate sodium (COLACE) capsule 50 mg  50 mg Oral BID PRN Na Li, MD   50 mg at 05/29/13 0038  . enoxaparin (LOVENOX) injection 30 mg  30 mg Subcutaneous Q24H Chuck Hint, MD      . guaiFENesin-dextromethorphan [facility] DM)  100-10 MG/5ML syrup 15 mL  15 mL Oral Q4H PRN Nada Libman, MD      . labetalol (NORMODYNE,TRANDATE) injection 10 mg  10 mg Intravenous Q2H PRN Nada Libman, MD       Or  . hydrALAZINE (APRESOLINE) injection 10 mg  10 mg Intravenous Q2H PRN Nada Libman, MD      . insulin aspart (novoLOG) injection 0-5 Units  0-5 Units Subcutaneous TID WC Dede Query, MD   1 Units at 05/26/13 1803  . insulin aspart (novoLOG) injection 4 Units  4 Units Subcutaneous TID WC Na Li, MD   2 Units at 05/29/13 1415  . insulin glargine (LANTUS) injection 6 Units  6 Units Subcutaneous BID Na Li, MD   6 Units at 05/29/13 1041  . isosorbide mononitrate (IMDUR) 24 hr tablet 30 mg  30 mg Oral QHS Genelle Gather, MD   30 mg at 05/28/13 2241  . latanoprost (XALATAN) 0.005 % ophthalmic solution 1 drop  1 drop Both Eyes QHS Genelle Gather, MD      . levothyroxine (SYNTHROID, LEVOTHROID) tablet 125 mcg  125 mcg Oral QAC breakfast Genelle Gather, MD  125 mcg at 05/29/13 1044  . meropenem (MERREM) 1 g in sodium chloride 0.9 % 100 mL IVPB  1 g Intravenous Q12H Thuy Dien Dang, RPH   1 g at 05/29/13 1043  . metoCLOPramide (REGLAN) tablet 5 mg  5 mg Oral TID AC Na Li, MD      . metoprolol (LOPRESSOR) injection 2-5 mg  2-5 mg Intravenous Q2H PRN Nada Libman, MD      . metoprolol succinate (TOPROL-XL) 24 hr tablet 25 mg  25 mg Oral Daily Genelle Gather, MD   25 mg at 05/29/13 1045  . morphine 2 MG/ML injection 1-2 mg  1-2 mg Intravenous Q3H PRN Vivi Barrack, MD      . mupirocin ointment (BACTROBAN) 2 % 1 application  1 application Nasal BID [name], DO   1 application at 05/29/13 1243  . ondansetron (ZOFRAN) tablet 4 mg  4 mg Oral Q6H PRN Genelle Gather, MD       Or  . ondansetron [facility]) injection 4 mg  4 mg Intravenous Q6H PRN Genelle Gather, MD   4 mg at 05/29/13 1041  . oxyCODONE (Oxy IR/ROXICODONE) immediate release tablet 10 mg  10 mg Oral Q6H PRN Vivi Barrack, MD   5 mg at 05/28/13 0816  . phenol (CHLORASEPTIC)  mouth spray 1 spray  1 spray Mouth/Throat PRN Nada Libman, MD        Allergies as of 05/22/2013 - Review Complete 05/22/2013  Allergen Reaction Noted  . Adhesive [tape] Other (See Comments) 06/11/2011  . Bactrim [sulfamethoxazole w-trimethoprim]  09/05/2012  . Cephalexin Swelling and Rash 02/03/2009  . Ciprofloxacin Swelling and Rash 02/03/2009    Family History  Problem Relation Age of Onset  . Diabetes Mother     History   Social History  . Marital Status: Single    Spouse Name: N/A    Number of Children: N/A  . Years of Education: N/A   Occupational History  . Environmental health practitioner    Social History Main Topics  . Smoking status: Never Smoker   . Smokeless tobacco: Never Used  . Alcohol Use: No  . Drug Use: No  . Sexual Activity: Not Currently    Birth Control/ Protection: Post-menopausal   Other Topics Concern  . Not on file   Social History Narrative  . No narrative on file    Review of Systems: Gen: Denies any fever, chills, sweats, anorexia, fatigue, weakness, malaise, weight loss, and sleep disorder HEENT: History of retinopathy CV: Denies chest pain, angina, palpitations, syncope, orthopnea, PND, peripheral edema, and claudication. Resp: Denies dyspnea at rest, dyspnea with exercise, cough, sputum, wheezing, coughing up blood, and pleurisy. GI: Denies vomiting blood, jaundice, and fecal incontinence.   Denies dysphagia or odynophagia. GU : Denies urinary burning, blood in urine, urinary frequency, urinary hesitancy, nocturnal urination, and urinary incontinence.  No renal calculi. MS: amputation of right toe Derm: Denies rash, itching, dry skin, hives, moles, warts, or unhealing ulcers.  Psych: Denies depression, anxiety, memory loss, suicidal ideation, hallucinations, paranoia, and confusion. Heme:breast cancer Neuro: peripheral sensory neuropathy Endocrine Diabetes type 1.  No Thyroid disease.  No Adrenal disease.  Physical Exam: Vital signs  in last 24 hours: Temp:  [98.5 F (36.9 C)-98.7 F (37.1 C)] 98.5 F (36.9 C) (08/15 1332) Pulse Rate:  [76] 76 (08/15 1332) Resp:  [16] 16 (08/15 1332) BP: (140-142)/(48-59) 140/59 mmHg (08/15 1332) SpO2:  [97 %-98 %] 98 % (08/15 1332) Weight:  [83.008  kg (183 lb)] 83.008 kg (183 lb) (08/15 0551) Last BM Date: 05/23/13 General:   Alert, chronically ill cooperative in NAD Head:  Normocephalic and atraumatic. Eyes:  Sclera clear, no icterus.   Conjunctiva pink. Ears:  Normal auditory acuity. Nose:  No deformity, discharge,  or lesions. Mouth:  No deformity or lesions, dentition normal. Neck:  Supple; no masses or thyromegaly. JVP not elevated Lungs:  Clear throughout to auscultation.   No wheezes, crackles, or rhonchi. No acute distress. Heart:  Regular rate and rhythm; no murmurs, clicks, rubs,  or gallops. Abdomen:  Soft, nontender and nondistended. No masses, hepatosplenomegaly or hernias noted. Normal bowel sounds, without guarding, and without rebound.   Msk: amputated right forefoot and amputated right toe Pulses: diminished lower extremity pulses Extremities:  Without clubbing or edema. Neurologic:  Alert and  oriented x4;  grossly normal neurologically. Skin:  Intact without significant lesions or rashes. Cervical Nodes:  No significant cervical adenopathy. Psych:  Alert and cooperative. Normal mood and affect.  Intake/Output from previous day: 08/14 0701 - 08/15 0700 In: 1050 [P.O.:240; I.V.:500; IV Piggyback:310] Out: 750 [Urine:750] Intake/Output this shift: Total I/O In: 120 [P.O.:120] Out: -   Lab Results:  Recent Labs  05/27/13 0535 05/28/13 0515 05/29/13 0500  WBC 7.3 9.5 9.7  HGB 11.1* 10.6* 10.5*  HCT 32.1* 30.6* 31.5*  PLT 198 214 212   BMET  Recent Labs  05/27/13 0535 05/28/13 0515 05/29/13 0500  NA 135 135 134*  K 4.1 4.2 4.5  CL 103 106 104  CO2 25 20 20   GLUCOSE 152* 100* 128*  BUN 39* 34* 37*  CREATININE 1.70* 1.51* 1.74*  CALCIUM  8.6 8.5 8.5   LFT No results found for this basename: PROT, ALBUMIN, AST, ALT, ALKPHOS, BILITOT, BILIDIR, IBILI,  in the last 72 hours PT/INR No results found for this basename: LABPROT, INR,  in the last 72 hours Hepatitis Panel No results found for this basename: HEPBSAG, HCVAB, HEPAIGM, HEPBIGM,  in the last 72 hours  Studies/Results: No results found.  Assessment/Plan:  Acute on Chronic renal failure could be ischemic ATN post operative. Could be contrast nephropathy or Vancomycin induced nephrotoxicity.  HTN controlled  CKD 3 will check renal ultrasound and protein creatinine ratio  Anemia above 10  Bones will check phosphorus and calcium and PTH   LOS: 7 Olusegun Gerstenberger W @TODAY @2 :40 PM

## 2013-05-29 NOTE — Progress Notes (Signed)
Subjective: Pain improved from yesterday. Able to tolerate some PT. Orthotic shoe fitted, patient practicing gait with shoe with PT.  She is having nausea and difficulty tolerating diet. Immediately after surgery she was ok but nausea has worsened over past 24 hours. No vomiting. Zofran helps some. She thinks it will resolve when she is home and can control what she eats.   No other complaints   Objective: Vital signs in last 24 hours: Filed Vitals:   05/28/13 0214 05/28/13 0620 05/28/13 1423 05/29/13 0551  BP: 132/55 135/50 142/66 142/48  Pulse: 67 63 75 76  Temp: 98.4 F (36.9 C) 98.1 F (36.7 C) 98.5 F (36.9 C) 98.7 F (37.1 C)  TempSrc: Oral Oral Oral Oral  Resp: 18 18 18 16   Height:      Weight:  76.2 kg (167 lb 15.9 oz)  83.008 kg (183 lb)  SpO2: 93% 94% 96% 97%   Weight change: 6.804 kg (15 lb)  Intake/Output Summary (Last 24 hours) at 05/29/13 1257 Last data filed at 05/29/13 0932  Gross per 24 hour  Intake    930 ml  Output    750 ml  Net    180 ml   General:NAD  Lungs : CTAB, no crackles, wheezes  Heart: RRR, murmur II/VI unchanged  Abd: soft, BS x 4  LE swelling and erythema improved. Orthotic shoe in place over bandage on the right. Home orthotic shoe in place on the left.   Lab Results:  CBC    Component Value Date/Time   WBC 9.7 05/29/2013 0500   RBC 3.42* 05/29/2013 0500   HGB 10.5* 05/29/2013 0500   HCT 31.5* 05/29/2013 0500   PLT 212 05/29/2013 0500   MCV 92.1 05/29/2013 0500   MCH 30.7 05/29/2013 0500   MCHC 33.3 05/29/2013 0500   RDW 14.9 05/29/2013 0500     CMP     Component Value Date/Time   NA 134* 05/29/2013 0500   K 4.5 05/29/2013 0500   CL 104 05/29/2013 0500   CO2 20 05/29/2013 0500   GLUCOSE 128* 05/29/2013 0500   BUN 37* 05/29/2013 0500   CREATININE 1.74* 05/29/2013 0500   CALCIUM 8.5 05/29/2013 0500    CBG (last 3)   Recent Labs  05/28/13 2155 05/29/13 0809 05/29/13 1205  GLUCAP 92 115* 140*    CREATININE: 1.74 mg/dL  ABNORMAL (16/10/96 0454) Estimated creatinine clearance - Cockcroft-Gault CrCl: 32 mL/min   Micro Results: Results for orders placed during the hospital encounter of 05/22/13  CULTURE, BLOOD (ROUTINE X 2)     Status: None   Collection Time    05/22/13 11:59 PM      Result Value Range Status   Specimen Description BLOOD RIGHT ARM   Final   Special Requests BOTTLES DRAWN AEROBIC ONLY [facility]   Final   Culture  Setup Time     Final   Value: 05/23/2013 11:17     Performed at [facility]   Culture     Final   Value: NO GROWTH 5 DAYS     Performed at [facility]   Report Status 05/29/2013 FINAL   Final  CULTURE, BLOOD (ROUTINE X 2)     Status: None   Collection Time    05/23/13 12:15 AM      Result Value Range Status   Specimen Description BLOOD LEFT HAND   Final   Special Requests BOTTLES DRAWN AEROBIC ONLY 2CC   Final   Culture  Setup Time     Final   Value: 05/23/2013 11:16     Performed at [facility]   Culture     Final   Value: NO GROWTH 5 DAYS     Performed at [facility]   Report Status 05/29/2013 FINAL   Final  SURGICAL PCR SCREEN     Status: Abnormal   Collection Time    05/23/13  6:10 AM      Result Value Range Status   MRSA, PCR POSITIVE (*) NEGATIVE Final   Staphylococcus aureus POSITIVE (*) NEGATIVE Final   Comment:            The Xpert SA Assay (FDA     approved for NASAL specimens     in patients over 48 years of age),     is one component of     a comprehensive surveillance     program.  Test performance has     been validated by [employer] for patients greater     than or equal to 3 year old.     It is not intended     to diagnose infection nor to     guide or monitor treatment.    Studies/Results: Reviewed and documented in Electronic Record Medications: I have reviewed the patient's current medications. Scheduled Meds:  brimonidine  1 drop Both Eyes BID   Chlorhexidine Gluconate Cloth  6 each Topical Q0600    DAPTOmycin (CUBICIN)  IV  500 mg Intravenous Q24H   enoxaparin (LOVENOX) injection  30 mg Subcutaneous Q24H   insulin aspart  0-5 Units Subcutaneous TID WC   insulin aspart  4 Units Subcutaneous TID WC   insulin glargine  6 Units Subcutaneous BID   isosorbide mononitrate  30 mg Oral QHS   latanoprost  1 drop Both Eyes QHS   levothyroxine  125 mcg Oral QAC breakfast   meropenem (MERREM) IV  1 g Intravenous Q12H   metoCLOPramide  5 mg Oral TID AC   metoprolol succinate  25 mg Oral Daily   mupirocin ointment  1 application Nasal BID   Continuous Infusions:  sodium chloride 50 mL/hr at 05/28/13 1030   PRN Meds:.acetaminophen, acetaminophen, alum & mag hydroxide-simeth, docusate sodium, guaiFENesin-dextromethorphan, hydrALAZINE, labetalol, metoprolol, morphine injection, ondansetron (ZOFRAN) IV, ondansetron, oxyCODONE, phenol  Assessment/Plan: Ms Burrowes is a 67 yo with poorly controlled Type I DM, CAD s/p CABG, PVD s/p L fem to anterior tibial artery bypass, L forefoot amputation in 2011, and CKD who presents from clinic with right foot ulcer evaluation. MRI indicated OM.   # OM and cellulitis associated with Diabetic right midfoot ulcer  -- post op day 2, -amputation and angioplasty without stent SFA 8/14.  --zofran and oxycodone prn post op pain and nausea  -- Appreciate vascular input -  Appreciate ID consult  -- Blood culture x 2 pending, negative to date  -- Vancomycin stopped due to worsening of kidney function--Trough level 21.9, 8/13  -- Daptomycin per ID -- Added meropenem 8/14 for empiric gram negative coverage  -- will need PICC or other central line for long term treatment, will discuss with Renal, want to preserve arm vasculature for possible future dialysis    #PVD - Angiogram 8/11    05/27/13 S/P atherectomy and angioplasty of the right superficial femoral artery    05/27/13 S/P Ray amputation right fifth toe - Appreciate VVS input!! - defer to VVS for  management   #  Hyponatremia , corrected  - follow up BMP   # AKI on CKD (Stage III)--1.74 today, up from 1.51 8/14   Recent Labs Lab 05/25/13 1540 05/26/13 0440 05/27/13 0535 05/28/13 0515 05/29/13 0500  CREATININE 1.72* 1.85* 1.70* 1.51* 1.74*    - The bump today, 1.74 from 1.51 is likely from contrast used in OR. Given addition of meropenem yesterday, could also be 2/2 to medication - In general, this AKI is likely multifactorial. Medication induced vs pre renal, and the contrast that she received on 05/25/13 would complicate it as well   - D/C all Nephrotoxic medications - will d/c statin and zetia as well to reduce risk of rhabdomyolysis and related kidney injury seen with these medications when combined with daptomycin  - will consult Renal today, appreciate their recs  - Monitor the fluid status closely-- Last Echo 11/14/11: EF 30-35%, grade 2 diastolic dysfunction), moderate to severe AS.  - BMP daily  # Uncontrolled DM type 1  Patient reports that she has had hypoglycemia symptoms whenever her CBG is < 120. She would like her goal of FSBS to be 130-150. Given her GFR and weight, her calculated basal requirement is 10-12 daily.   - Lantus 6 BID--FSBS 128 today - Novolog prandial coverage 4 TID  - Customized scale is ordered--sensitivity factor is 60 now.  - Sugar control is worsened by post-op nausea and anorexia, discussed with patient the importance of taking zofran as needed and attempting to eat some with each meal  # Anemia  Recent Labs Lab 05/27/13 0535 05/28/13 0515 05/29/13 0500  HGB 11.1* 10.6* 10.5*  HCT 32.1* 30.6* 31.5*  WBC 7.3 9.5 9.7  PLT 198 214 212   - patient was admitted with HH 12.6/36.8 - it remains on 11.1/32.1, Likely associated with dilutional effect from her IVF infusion since admission. - follow CBC since she is s/p Ray amputation right fifth toe on 05/27/13.  #CHF - Stable.  - Appreciate cardiology consult  - Continue home mdur,  metoprolol  - D/C Lasix due to AKI  - Seen by Cards over weekend, appreciate their input, listed patient as moderate risk for surgery  - F/U ECHO ordered by cards   #HLD - continue home Lipitor, ezetimibe   #Hypothyroidism - continue home synthroid   #DVT PPX - Lovenox per VVS     Dispo: Disposition is deferred at this time, awaiting improvement of current medical problems.  Anticipated discharge in approximately 2-3 day(s).   The patient does have a current PCP [name], MD) and does need an [facility] hospital follow-up appointment after discharge.  The patient does not have transportation limitations that hinder transportation to clinic appointments.  .Services Needed at time of discharge: Y = Yes, Blank = No PT:   OT:   RN:   Equipment:   Other:     LOS: 7 days   Freddi Che, [facility] MS4, Acting Intern  05/29/2013, 12:57 PM

## 2013-05-29 NOTE — Progress Notes (Addendum)
ANTIBIOTIC CONSULT NOTE - FOLLOW UP  Pharmacy Consult:  [facility] Indication:  Osteomyelitis s/p toe amputation  Allergies  Allergen Reactions  . Adhesive [Tape] Other (See Comments)    "old Johnson/Johnson adhesive tape; takes my skin off; can use paper tape"  . Bactrim [Sulfamethoxazole W-Trimethoprim]     Hyperkalemia and Increased creatinine  . Cephalexin Swelling and Rash  . Ciprofloxacin Swelling and Rash    Patient Measurements: Height: 5\' 3"  (160 cm) Weight: 183 lb (83.008 kg) IBW/kg (Calculated) : 52.4  Vital Signs: Temp: 98.7 F (37.1 C) (08/15 0551) Temp src: Oral (08/15 0551) BP: 142/48 mmHg (08/15 0551) Pulse Rate: 76 (08/15 0551) Intake/Output from previous day: 08/14 0701 - 08/15 0700 In: 1050 [P.O.:240; I.V.:500; IV Piggyback:310] Out: 750 [Urine:750] Intake/Output from this shift: Total I/O In: 120 [P.O.:120] Out: -   Labs:  Recent Labs  05/27/13 0535 05/28/13 0515 05/29/13 0500  WBC 7.3 9.5 9.7  HGB 11.1* 10.6* 10.5*  PLT 198 214 212  CREATININE 1.70* 1.51* 1.74*   Estimated Creatinine Clearance: 32 ml/min (by C-G formula based on Cr of 1.74).  Recent Labs  05/26/13 2330  VANCOTROUGH 21.9*     Microbiology: Recent Results (from the past 720 hour(s))  CULTURE, BLOOD (ROUTINE X 2)     Status: None   Collection Time    05/22/13 11:59 PM      Result Value Range Status   Specimen Description BLOOD RIGHT ARM   Final   Special Requests BOTTLES DRAWN AEROBIC ONLY [facility]   Final   Culture  Setup Time     Final   Value: 05/23/2013 11:17     Performed at [facility]   Culture     Final   Value: NO GROWTH 5 DAYS     Performed at [facility]   Report Status 05/29/2013 FINAL   Final  CULTURE, BLOOD (ROUTINE X 2)     Status: None   Collection Time    05/23/13 12:15 AM      Result Value Range Status   Specimen Description BLOOD LEFT HAND   Final   Special Requests BOTTLES DRAWN AEROBIC ONLY 2[facility]   Final   Culture  Setup Time      Final   Value: 05/23/2013 11:16     Performed at [facility]   Culture     Final   Value: NO GROWTH 5 DAYS     Performed at [facility]   Report Status 05/29/2013 FINAL   Final  SURGICAL PCR SCREEN     Status: Abnormal   Collection Time    05/23/13  6:10 AM      Result Value Range Status   MRSA, PCR POSITIVE (*) NEGATIVE Final   Staphylococcus aureus POSITIVE (*) NEGATIVE Final   Comment:            The Xpert SA Assay (FDA     approved for NASAL specimens     in patients over 58 years of age),     is one component of     a comprehensive surveillance     program.  Test performance has     been validated by [employer] for patients greater     than or equal to 74 year old.     It is not intended     to diagnose infection nor to     guide or monitor treatment.      Assessment:  66 YOF to continue on daptomycin and meropenem for right foot osteomyelitis, s/p 5th toe amputation POD#2.  Her renal function is fluctuating - declining today.  Patient's CK is elevated at 384 (was 58 in January 2013), but remains less than 1000 units/L or < 5 times ULN.  She is currently on Lipitor, which could also increase CK.  Patient did not report any muscle aches or pain.   Vanc 8/8 >> 8/12 Zosyn 8/8 >> 8/9 Cubicin 8/13 >> Merrem 8/14 >>  8/8 Bldx2 - negative 8/9 MRSA PCR - positive  8/12 VT = 21.9 mcg/mL (1g q24, SCr 1.7)   Goal of Therapy:  Clearance of infection CK < 177   Plan:  - Continue Cubicin 500mg  IV Q24H - Merrem 1gm IV Q12H - BMET in AM and if SCr continues to rise, will recheck CK on 05/31/13 - Follow renal fxn closely and adjust antibiotics as appropriate - Continue weekly CK (every Friday) and PRN - Monitor for new onset or worsening peripheral neuropathy, s/sx of eosinophilic PNA, muscle aches and pain  Thuy D. Laney Potash, PharmD, BCPS Pager:  [phone] 05/29/2013, 11:46 AM  Addendum:  Changing daptomycin to linezolid PO d/t CK elevations.    Plan: 1. Linezolid 600mg  PO BID 2. F/u further ID plans and CBC  Lysle Pearl, PharmD, BCPS Pager # [phone] 05/29/2013 3:46 PM

## 2013-05-29 NOTE — Progress Notes (Signed)
Echocardiogram 2D Echocardiogram has been performed.  [name] 05/29/2013, 10:42 AM

## 2013-05-29 NOTE — Progress Notes (Signed)
I have seen the patient and reviewed the daily progress note by MS Hosie Spangle and discussed the care of the patient with them.  See below for documentation of my findings, assessment, and plans.  Subjective: Patient c/o mild nausea but no vomiting. She states that she is unable to tolerate her diet well due to nausea.  She has a lot of questions with regards to her antibiotic treatment and is unsure whether she would agree with the plan.  She states that she would like to speak with her VVS about the antibiotic plan.  Objective: Vital signs in last 24 hours: Filed Vitals:   05/28/13 0620 05/28/13 1423 05/29/13 0551 05/29/13 1332  BP: 135/50 142/66 142/48 140/59  Pulse: 63 75 76 76  Temp: 98.1 F (36.7 C) 98.5 F (36.9 C) 98.7 F (37.1 C) 98.5 F (36.9 C)  TempSrc: Oral Oral Oral Oral  Resp: 18 18 16 16   Height:      Weight: 167 lb 15.9 oz (76.2 kg)  183 lb (83.008 kg)   SpO2: 94% 96% 97% 98%   Weight change: 15 lb (6.804 kg)  Intake/Output Summary (Last 24 hours) at 05/29/13 1437 Last data filed at 05/29/13 0932  Gross per 24 hour  Intake    930 ml  Output    750 ml  Net    180 ml   General:NAD  Lungs : CTAB, no crackles, wheezes  Heart: RRR, murmur II/VI unchanged  Abd: soft, BS x 4  LE swelling and erythema improved. Orthotic shoe in place over bandage on the right. Home orthotic shoe in place on the left.   Lab Results: Reviewed and documented in Electronic Record Micro Results: Reviewed and documented in Electronic Record Studies/Results: Reviewed and documented in Electronic Record Medications: I have reviewed the patient's current medications. Scheduled Meds: . brimonidine  1 drop Both Eyes BID  . Chlorhexidine Gluconate Cloth  6 each Topical Q0600  . DAPTOmycin (CUBICIN)  IV  500 mg Intravenous Q24H  . enoxaparin (LOVENOX) injection  30 mg Subcutaneous Q24H  . insulin aspart  0-5 Units Subcutaneous TID WC  . insulin aspart  4 Units Subcutaneous TID WC    . insulin glargine  6 Units Subcutaneous BID  . isosorbide mononitrate  30 mg Oral QHS  . latanoprost  1 drop Both Eyes QHS  . levothyroxine  125 mcg Oral QAC breakfast  . meropenem (MERREM) IV  1 g Intravenous Q12H  . metoCLOPramide  5 mg Oral TID AC  . metoprolol succinate  25 mg Oral Daily  . mupirocin ointment  1 application Nasal BID   Continuous Infusions: . sodium chloride 50 mL/hr at 05/28/13 1030   PRN Meds:.acetaminophen, acetaminophen, alum & mag hydroxide-simeth, docusate sodium, guaiFENesin-dextromethorphan, hydrALAZINE, labetalol, metoprolol, morphine injection, ondansetron (ZOFRAN) IV, ondansetron, oxyCODONE, phenol Assessment/Plan:  Ms Haltiwanger is a 67 yo with poorly controlled Type I DM, CAD s/p CABG, PVD s/p L fem to anterior tibial artery bypass, L forefoot amputation in 2011, and CKD who presents from clinic with right foot ulcer evaluation. MRI indicated OM.   # Elevated CK    She denies muscle pain.     CK is 384 this am. Daptomycin started on 05/27/13. She is also on Lipitor and Zetia.  - CK daily - Will stop Daptomycin and add Zyvox per ID Dr. Daiva Eves. - Stop lipitor and Zetia.   # OM and cellulitis associated with Diabetic right midfoot ulcer  -- Appreciate vascular input!!--POD #  2 Right 5th toe ray amputation and stent SFA 05/27/13.  -  Appreciate ID consult -  -- Blood culture x 2 NGTD (Final) -- ABX  Zosyn<<8/9 stopped  Vancomycin<<8/9-8/11  Daptomycin<<8/13--8/15 Meropenem<< 8/14--  #PVD - Angiogram 8/11  05/27/13 S/P atherectomy and angioplasty of the right superficial femoral artery  05/27/13 S/P Ray amputation right fifth toe  - Appreciate VVS input!!  - defer to VVS for management   # Hyponatremia , corrected  - follow up BMP   # AKI on CKD (Stage III)-- likely multifactorial. Medication induced vs pre renal, and the contrast that she received on 05/25/13 and 05/27/13 would complicate it as well.    Recent Labs Lab 05/25/13 1540  05/26/13 0440 05/27/13 0535 05/28/13 0515 05/29/13 0500  CREATININE 1.72* 1.85* 1.70* 1.51* 1.74*    - D/C all Nephrotoxic medications  - Monitor the fluid status closely-- Last Echo 11/14/11: EF 30-35%, grade 2 diastolic dysfunction), moderate to severe AS.  - Gentle fluid replacement NS 50/h  - BMP daily  - Will also consult Nephrology given her worsening of renal function and possible need of PICC vs CVP line for lng term ABX.   # Uncontrolled DM type 1 --good control which will be helpful for wound healing.  Patient reports that she has had hypoglycemia symptoms whenever her CBG is < 120. She would like her goal of FSBS to be 130-150. Given her GFR and weight, her calculated basal requirement is 10-12 daily .  - Lantus 6 BID - Novolog prandial coverage 4 TID, 2 units with 1/2 tray  - Customized scale is ordered--sensitivity factor is 60 now.   # Anemia    Recent Labs Lab 05/27/13 0535 05/28/13 0515 05/29/13 0500  HGB 11.1* 10.6* 10.5*  HCT 32.1* 30.6* 31.5*  WBC 7.3 9.5 9.7  PLT 198 214 212   - patient was admitted with HH 12.6/36.8  - it remains on 11.1/32.1, Likely associated with dilutional effect from her IVF infusion since admission.  - follow CBC since she is s/p Ray amputation right fifth toe on 05/27/13.   #CHF - Stable.  - Appreciate cardiology consult  - Continue home mdur, metoprolol  - D/C Lasix due to AKI  - Seen by Cards over weekend, appreciate their input, listed patient as moderate risk for surgery  - Echo on 05/29/13 indicated EF 30% and diastolic dysfunction.   #HLD - Stop Lipitor and Zetia.   #Hypothyroidism - continue home synthroid   #DVT PPX - Lovenox per VVS   Dispo: Disposition is deferred at this time, awaiting improvement of current medical problems.  Anticipated discharge in approximately 2-3 day(s).   The patient does have a current PCP [name], MD) and does need an [facility] hospital follow-up appointment after discharge.  The patient  does not have transportation limitations that hinder transportation to clinic appointments.  .Services Needed at time of discharge: Y = Yes, Blank = No PT:   OT:   RN:   Equipment:   Other:     LOS: 7 days   Dede Query, MD 05/29/2013, 2:37 PM

## 2013-05-30 LAB — RENAL FUNCTION PANEL
BUN: 34 mg/dL — ABNORMAL HIGH (ref 6–23)
Chloride: 106 mEq/L (ref 96–112)
Creatinine, Ser: 1.82 mg/dL — ABNORMAL HIGH (ref 0.50–1.10)
Glucose, Bld: 75 mg/dL (ref 70–99)
Potassium: 4.4 mEq/L (ref 3.5–5.1)

## 2013-05-30 LAB — CBC
MCV: 90.9 fL (ref 78.0–100.0)
Platelets: 218 10*3/uL (ref 150–400)
RDW: 14.9 % (ref 11.5–15.5)
WBC: 8 10*3/uL (ref 4.0–10.5)

## 2013-05-30 LAB — GLUCOSE, CAPILLARY
Glucose-Capillary: 59 mg/dL — ABNORMAL LOW (ref 70–99)
Glucose-Capillary: 106 mg/dL — ABNORMAL HIGH (ref 70–99)
Glucose-Capillary: 96 mg/dL (ref 70–99)

## 2013-05-30 MED ORDER — INSULIN GLARGINE 100 UNIT/ML ~~LOC~~ SOLN
5.0000 [IU] | Freq: Two times a day (BID) | SUBCUTANEOUS | Status: DC
  Administered 2013-05-30 (×2): 5 [IU] via SUBCUTANEOUS
  Filled 2013-05-30 (×4): qty 0.05

## 2013-05-30 MED ORDER — SODIUM CHLORIDE 0.9 % IV SOLN
500.0000 mg | Freq: Two times a day (BID) | INTRAVENOUS | Status: DC
  Administered 2013-05-30 – 2013-06-02 (×7): 500 mg via INTRAVENOUS
  Filled 2013-05-30 (×9): qty 0.5

## 2013-05-30 MED ORDER — METOCLOPRAMIDE HCL 5 MG PO TABS: 5.0000 mg | ORAL_TABLET | Freq: Three times a day (TID) | ORAL | Status: DC | PRN

## 2013-05-30 NOTE — Progress Notes (Signed)
Subjective: Nausea improved, able to tolerate more PO last night. Pain manageable, only needed a 1/2 dose of her prn oxy last night. No Muscle aches, SOB, CP.  Was not symptomatic last night with low BS (68), feeling a little light headed with sugar this morning (59 fasting).    Objective: Vital signs in last 24 hours: Filed Vitals:   05/28/13 1423 05/29/13 0551 05/29/13 1332 05/30/13 0525  BP: 142/66 142/48 140/59 148/61  Pulse: 75 76 76 77  Temp: 98.5 F (36.9 C) 98.7 F (37.1 C) 98.5 F (36.9 C) 98.1 F (36.7 C)  TempSrc: Oral Oral Oral   Resp: 18 16 16 12   Height:      Weight:  83.008 kg (183 lb)    SpO2: 96% 97% 98% 98%   Weight change:  discuss with nursing the importance of consistent weighing   Intake/Output Summary (Last 24 hours) at 05/30/13 0830 Last data filed at 05/29/13 0932  Gross per 24 hour  Intake    120 ml  Output      0 ml  Net    120 ml   General:NAD  Lungs : CTAB, no crackles, wheezes  Heart: RRR, murmur II/VI unchanged  Abd: soft, BS x 4  LE: 2+ pitting edema to knee, erythema unchanged   Lab Results:  CMP     Component Value Date/Time   NA 135 05/30/2013 0635   NA 137 08/07/2012 1101   NA 133* 11/29/2011 1629   K 4.4 05/30/2013 0635   K 4.1 08/07/2012 1101   CL 106 05/30/2013 0635   CL 102 08/07/2012 1101   CO2 20 05/30/2013 0635   CO2 25 08/07/2012 1101   GLUCOSE 75 05/30/2013 0635   GLUCOSE 320* 08/07/2012 1101   GLUCOSE 313* 11/29/2011 1629   BUN 34* 05/30/2013 0635   BUN 25.0 08/07/2012 1101   BUN 39* 11/29/2011 1629   CREATININE 1.82* 05/30/2013 0635   CREATININE 1.31* 12/05/2012 1625   CREATININE 1.0 08/07/2012 1101   CALCIUM 8.8 05/30/2013 0635   CALCIUM 9.8 08/07/2012 1101   PROT 8.1 11/28/2012 1900   ALBUMIN 2.2* 05/30/2013 0635   AST 42* 11/28/2012 1900   ALT 38* 11/28/2012 1900   ALKPHOS 167* 11/28/2012 1900   BILITOT 0.7 11/28/2012 1900   GFRNONAA 28* 05/30/2013 0635   GFRAA 32* 05/30/2013 0635   8/16 CK - 240  CBC      Component Value Date/Time   WBC 8.0 05/30/2013 0635   WBC 5.9 08/07/2012 1101   WBC 5.3 11/29/2011 1629   RBC 3.50* 05/30/2013 0635   RBC 4.47 08/07/2012 1101   RBC 4.37 04/04/2012 0610   RBC 3.71* 11/29/2011 1629   HGB 11.0* 05/30/2013 0635   HGB 12.9 08/07/2012 1101   HCT 31.8* 05/30/2013 0635   HCT 39.8 08/07/2012 1101   PLT 218 05/30/2013 0635   PLT 207 08/07/2012 1101   MCV 90.9 05/30/2013 0635   MCV 89.0 08/07/2012 1101   MCH 31.4 05/30/2013 0635   MCH 29.0 08/07/2012 1101   MCH 27.5 11/29/2011 1629   MCHC 34.6 05/30/2013 0635   MCHC 32.5 08/07/2012 1101   MCHC 32.1 11/29/2011 1629   RDW 14.9 05/30/2013 0635   RDW 19.3* 08/07/2012 1101   RDW 14.2 11/29/2011 1629   LYMPHSABS 1.2 05/23/2013 0015   LYMPHSABS 1.0 08/07/2012 1101   LYMPHSABS 1.4 11/29/2011 1629   MONOABS 0.8 05/23/2013 0015   MONOABS 0.9 08/07/2012 1101   EOSABS 0.4 05/23/2013 0015  EOSABS 0.4 08/07/2012 1101   BASOSABS 0.0 05/23/2013 0015   BASOSABS 0.0 08/07/2012 1101   BASOSABS 0.0 11/29/2011 1629      CBG (last 3)   Recent Labs  05/29/13 1830 05/29/13 2231 05/30/13 0824  GLUCAP 73 127* 59*    CREATININE: 1.82 mg/dL ABNORMAL (16/10/96 0454) Estimated creatinine clearance - Cockcroft-Gault CrCl: 30.6 mL/min   Micro Results: Results for orders placed during the hospital encounter of 05/22/13  CULTURE, BLOOD (ROUTINE X 2)     Status: None   Collection Time    05/22/13 11:59 PM      Result Value Range Status   Specimen Description BLOOD RIGHT ARM   Final   Special Requests BOTTLES DRAWN AEROBIC ONLY [facility]   Final   Culture  Setup Time     Final   Value: 05/23/2013 11:17     Performed at [facility]   Culture     Final   Value: NO GROWTH 5 DAYS     Performed at [facility]   Report Status 05/29/2013 FINAL   Final  CULTURE, BLOOD (ROUTINE X 2)     Status: None   Collection Time    05/23/13 12:15 AM      Result Value Range Status   Specimen Description BLOOD LEFT HAND   Final   Special  Requests BOTTLES DRAWN AEROBIC ONLY 2[facility]   Final   Culture  Setup Time     Final   Value: 05/23/2013 11:16     Performed at [facility]   Culture     Final   Value: NO GROWTH 5 DAYS     Performed at [facility]   Report Status 05/29/2013 FINAL   Final  SURGICAL PCR SCREEN     Status: Abnormal   Collection Time    05/23/13  6:10 AM      Result Value Range Status   MRSA, PCR POSITIVE (*) NEGATIVE Final   Staphylococcus aureus POSITIVE (*) NEGATIVE Final   Comment:            The Xpert SA Assay (FDA     approved for NASAL specimens     in patients over 55 years of age),     is one component of     a comprehensive surveillance     program.  Test performance has     been validated by [employer] for patients greater     than or equal to 14 year old.     It is not intended     to diagnose infection nor to     guide or monitor treatment.    Studies/Results: Reviewed and documented in Electronic Record Medications: I have reviewed the patient's current medications. Scheduled Meds:  brimonidine  1 drop Both Eyes BID   Chlorhexidine Gluconate Cloth  6 each Topical Q0600   docusate sodium  50 mg Oral BID   enoxaparin (LOVENOX) injection  30 mg Subcutaneous Q24H   insulin aspart  0-5 Units Subcutaneous TID WC   insulin aspart  4 Units Subcutaneous TID WC   insulin glargine  5 Units Subcutaneous BID   isosorbide mononitrate  30 mg Oral QHS   latanoprost  1 drop Both Eyes QHS   levothyroxine  125 mcg Oral QAC breakfast   linezolid  600 mg Oral Q12H   meropenem (MERREM) IV  1 g Intravenous Q12H   metoprolol succinate  25 mg Oral Daily  mupirocin ointment  1 application Nasal BID   Continuous Infusions:  sodium chloride 50 mL/hr at 05/30/13 0117   PRN Meds:.acetaminophen, acetaminophen, alum & mag hydroxide-simeth, guaiFENesin-dextromethorphan, hydrALAZINE, labetalol, metoCLOPramide, metoprolol, morphine injection, ondansetron (ZOFRAN) IV,  ondansetron, oxyCODONE, phenol  Assessment/Plan: [name] is a 67 yo with poorly controlled Type I DM, CAD s/p CABG, PVD s/p L fem to anterior tibial artery bypass, L forefoot amputation in 2011, and CKD who presents from clinic with right foot ulcer evaluation. MRI indicated OM.   # OM and cellulitis associated with Diabetic right midfoot ulcer  -- post op day 2, -amputation and angioplasty without stent SFA 8/14.  --zofran and oxycodone prn post op pain and nausea  -- Appreciate vascular input -  Appreciate ID consult  -- Blood culture x 2 pending, negative to date  -- Vancomycin stopped due to worsening of kidney function--Trough level 21.9, 8/13  -- Daptomycin d/c'd due to elevated CK -- Started Linezolid 8/15, pt refused morning dose until she could discuss with the primary team. She understands the changes and is going to take the medication as prescribed  -- Meropenem 8/14 for empiric gram negative coverage  -- will need PICC or other central line for long term treatment, appreciate Renal consult for best access  #Elevated CK -CK 240 today, from 384 yesterday   -d/c'd Lipitor and Zetia and Daptomycin yesterday     # AKI on CKD (Stage III)--1.82 up from 1.74 yesterday, up from 1.51 8/14   Recent Labs Lab 05/26/13 0440 05/27/13 0535 05/28/13 0515 05/29/13 0500 05/30/13 0635  CREATININE 1.85* 1.70* 1.51* 1.74* 1.82*    -Appreciate Renal consult -will follow up  protein creatine ratio, phos, Calcium, PTH, per renal  - In general, this AKI is likely multifactorial. Medication induced vs pre renal vs ATN vs contrast induced  - D/C all Nephrotoxic medications - Monitor the fluid status closely-- Echo yesterday unchanged from 11/14/11: EF 30-35%, grade 2 diastolic dysfunction), moderate to severe AS.  - BMP daily -Renal US 8/15: Chronic medical renal disease, no hydronephrosis   # Uncontrolled DM type 1  Patient reports that she has had hypoglycemia symptoms whenever her CBG  is < 120. She would like her goal of FSBS to be 130-150. Given her GFR and weight, her calculated basal requirement is 10-12 daily.   -Sugars low last night, will drop Lantus dose today  - Lantus 5 BID--FSBS 128 today - Novolog prandial coverage 4 TID  - Customized scale is ordered--sensitivity factor is 60 now.  - Sugar control is worsened by post-op nausea and anorexia, discussed with patient the importance of taking zofran as needed and attempting to eat some with each meal  #PVD - Angiogram 8/11    05/27/13 S/P atherectomy and angioplasty of the right superficial femoral artery    05/27/13 S/P Ray amputation right fifth toe - Appreciate VVS input!! - defer to VVS for management   # Hyponatremia , corrected  - follow up BMP   # Anemia  Recent Labs Lab 05/28/13 0515 05/29/13 0500 05/30/13 0635  HGB 10.6* 10.5* 11.0*  HCT 30.6* 31.5* 31.8*  WBC 9.5 9.7 8.0  PLT 214 212 218   - patient was admitted with HH 12.6/36.8 - it remains on 11.1/32.1, Likely associated with dilutional effect from her IVF infusion since admission. - follow CBC since she is s/p Ray amputation right fifth toe on 05/27/13.  #CHF - Stable.  - Appreciate cardiology consult  - Continue home mdur,  metoprolol  - D/C Lasix due to AKI  - Seen by Cards over weekend, appreciate their input, listed patient as moderate risk for surgery  - F/U ECHO ordered by cards   #HLD - hold home Lipitor, ezetimibe until CK normalizes    #Hypothyroidism - continue home synthroid   #DVT PPX - Lovenox per VVS     Dispo: Disposition is deferred at this time, awaiting improvement of current medical problems.  Anticipated discharge in approximately 2-3 day(s).   The patient does have a current PCP [name], MD) and does need an [facility] hospital follow-up appointment after discharge.  The patient does not have transportation limitations that hinder transportation to clinic appointments.  .Services Needed at time of discharge: Y  = Yes, Blank = No PT:   OT:   RN:   Equipment:   Other:     LOS: 8 days   [name] MS4, Acting Intern  05/30/2013, 8:30 AMPatient ID: [name], female   DOB: March 25, 1946, 67 y.o.   MRN: [id number]

## 2013-05-30 NOTE — Progress Notes (Signed)
ANTIBIOTIC CONSULT NOTE - FOLLOW UP  Pharmacy Consult:  Merrem + Zyvox Indication:  Osteomyelitis s/p toe amputation  Allergies  Allergen Reactions  . Adhesive [Tape] Other (See Comments)    "old [employer]Johnson adhesive tape; takes my skin off; can use paper tape"  . Bactrim [Sulfamethoxazole W-Trimethoprim]     Hyperkalemia and Increased creatinine  . Cephalexin Swelling and Rash  . Ciprofloxacin Swelling and Rash    Patient Measurements: Height: 5\' 3"  (160 cm) Weight: 183 lb (83.008 kg) IBW/kg (Calculated) : 52.4  Vital Signs: Temp: 98.1 F (36.7 C) (08/16 0525) BP: 148/61 mmHg (08/16 0525) Pulse Rate: 77 (08/16 0525) Intake/Output from previous day: 08/15 0701 - 08/16 0700 In: 120 [P.O.:120] Out: -   Labs:  Recent Labs  05/28/13 0515 05/29/13 0500 05/30/13 0635  WBC 9.5 9.7 8.0  HGB 10.6* 10.5* 11.0*  PLT 214 212 218  CREATININE 1.51* 1.74* 1.82*   Estimated Creatinine Clearance: 30.6 ml/min (by C-G formula based on Cr of 1.82). No results found for this basename: VANCOTROUGH, Leodis Binet, VANCORANDOM, GENTTROUGH, GENTPEAK, GENTRANDOM, TOBRATROUGH, TOBRAPEAK, TOBRARND, AMIKACINPEAK, AMIKACINTROU, AMIKACIN,  in the last 72 hours   Microbiology: Recent Results (from the past 720 hour(s))  CULTURE, BLOOD (ROUTINE X 2)     Status: None   Collection Time    05/22/13 11:59 PM      Result Value Range Status   Specimen Description BLOOD RIGHT ARM   Final   Special Requests BOTTLES DRAWN AEROBIC ONLY [facility]   Final   Culture  Setup Time     Final   Value: 05/23/2013 11:17     Performed at [facility]   Culture     Final   Value: NO GROWTH 5 DAYS     Performed at [facility]   Report Status 05/29/2013 FINAL   Final  CULTURE, BLOOD (ROUTINE X 2)     Status: None   Collection Time    05/23/13 12:15 AM      Result Value Range Status   Specimen Description BLOOD LEFT HAND   Final   Special Requests BOTTLES DRAWN AEROBIC ONLY 2[facility]   Final   Culture   Setup Time     Final   Value: 05/23/2013 11:16     Performed at [facility]   Culture     Final   Value: NO GROWTH 5 DAYS     Performed at [facility]   Report Status 05/29/2013 FINAL   Final  SURGICAL PCR SCREEN     Status: Abnormal   Collection Time    05/23/13  6:10 AM      Result Value Range Status   MRSA, PCR POSITIVE (*) NEGATIVE Final   Staphylococcus aureus POSITIVE (*) NEGATIVE Final   Comment:            The Xpert SA Assay (FDA     approved for NASAL specimens     in patients over 1 years of age),     is one component of     a comprehensive surveillance     program.  Test performance has     been validated by [employer] for patients greater     than or equal to 24 year old.     It is not intended     to diagnose infection nor to     guide or monitor treatment.      Assessment: 31 YOF with right foot  osteomyelitis, s/p 5th toe amputation POD#3.  Currently on Zyvox and Merrem.  Patient's renal function is declining slowly.  Vanc 8/8 >> 8/12 Zosyn 8/8 >> 8/9 Cubicin 8/13 >> 8/15 (d/c'ed d/t elevated CK) Merrem 8/14 >> Zyvox 8/15 >>  8/8 Bldx2 - negative 8/9 MRSA PCR - positive  8/12 VT = 21.9 mcg/mL (1g q24, SCr 1.7)   Goal of Therapy:  Clearance of infection   Plan:  - Change Merrem 500mg  IV Q12H - Zyvox 600mg  PO BID - Follow renal fxn closely and adjust antibiotics as appropriate - Monitor vitals, CBC - F/U resume Lipitor/Zetia once CK normalizes, CBGs and insulin adjustments    Caton Popowski D. Laney Potash, PharmD, BCPS Pager:  [phone] 05/30/2013, 10:29 AM

## 2013-05-30 NOTE — Progress Notes (Signed)
[facility] ROUNDING NOTE   Subjective:   Interval History:no complaints  Objective:  Vital signs in last 24 hours:  Temp:  [98.1 F (36.7 C)-98.5 F (36.9 C)] 98.1 F (36.7 C) (08/16 0525) Pulse Rate:  [76-77] 77 (08/16 0525) Resp:  [12-16] 12 (08/16 0525) BP: (140-148)/(59-61) 148/61 mmHg (08/16 0525) SpO2:  [98 %] 98 % (08/16 0525)  Weight change:  Filed Weights   05/27/13 0815 05/28/13 0620 05/29/13 0551  Weight: 76.204 kg (168 lb) 76.2 kg (167 lb 15.9 oz) 83.008 kg (183 lb)    Intake/Output: I/O last 3 completed shifts: In: 930 [P.O.:120; I.V.:500; IV Piggyback:310] Out: 750 [Urine:750]   Intake/Output this shift:     CVS- RRR RS- CTA ABD- BS present soft non-distended EXT- no edema, bandaged foot   Basic Metabolic Panel:  Recent Labs Lab 05/26/13 0440 05/27/13 0535 05/28/13 0515 05/29/13 0500 05/30/13 0635  NA 131* 135 135 134* 135  K 4.7 4.1 4.2 4.5 4.4  CL 98 103 106 104 106  CO2 23 25 20 20 20   GLUCOSE 201* 152* 100* 128* 75  BUN 46* 39* 34* 37* 34*  CREATININE 1.85* 1.70* 1.51* 1.74* 1.82*  CALCIUM 8.8 8.6 8.5 8.5 8.8  PHOS  --   --   --   --  3.7    Liver Function Tests:  Recent Labs Lab 05/30/13 0635  ALBUMIN 2.2*   No results found for this basename: LIPASE, AMYLASE,  in the last 168 hours No results found for this basename: AMMONIA,  in the last 168 hours  CBC:  Recent Labs Lab 05/26/13 0440 05/27/13 0535 05/28/13 0515 05/29/13 0500 05/30/13 0635  WBC 8.1 7.3 9.5 9.7 8.0  HGB 11.0* 11.1* 10.6* 10.5* 11.0*  HCT 31.7* 32.1* 30.6* 31.5* 31.8*  MCV 90.1 90.2 91.6 92.1 90.9  PLT 206 198 214 212 218    Cardiac Enzymes:  Recent Labs Lab 05/29/13 0500 05/30/13 0635  CKTOTAL 384* 240*    BNP: No components found with this basename: POCBNP,   CBG:  Recent Labs Lab 05/29/13 1828 05/29/13 1830 05/29/13 2231 05/30/13 0824 05/30/13 0949  GLUCAP 68* 73 127* 59* 136*    Microbiology: Results for  orders placed during the hospital encounter of 05/22/13  CULTURE, BLOOD (ROUTINE X 2)     Status: None   Collection Time    05/22/13 11:59 PM      Result Value Range Status   Specimen Description BLOOD RIGHT ARM   Final   Special Requests BOTTLES DRAWN AEROBIC ONLY [facility]   Final   Culture  Setup Time     Final   Value: 05/23/2013 11:17     Performed at [facility]   Culture     Final   Value: NO GROWTH 5 DAYS     Performed at [facility]   Report Status 05/29/2013 FINAL   Final  CULTURE, BLOOD (ROUTINE X 2)     Status: None   Collection Time    05/23/13 12:15 AM      Result Value Range Status   Specimen Description BLOOD LEFT HAND   Final   Special Requests BOTTLES DRAWN AEROBIC ONLY 2[facility]   Final   Culture  Setup Time     Final   Value: 05/23/2013 11:16     Performed at [facility]   Culture     Final   Value: NO GROWTH 5 DAYS     Performed at [facility]   Report Status 05/29/2013 FINAL   Final  SURGICAL PCR SCREEN     Status: Abnormal   Collection Time    05/23/13  6:10 AM      Result Value Range Status   MRSA, PCR POSITIVE (*) NEGATIVE Final   Staphylococcus aureus POSITIVE (*) NEGATIVE Final   Comment:            The Xpert SA Assay (FDA     approved for NASAL specimens     in patients over 62 years of age),     is one component of     a comprehensive surveillance     program.  Test performance has     been validated by [employer] for patients greater     than or equal to 75 year old.     It is not intended     to diagnose infection nor to     guide or monitor treatment.    Coagulation Studies: No results found for this basename: LABPROT, INR,  in the last 72 hours  Urinalysis: No results found for this basename: COLORURINE, APPERANCEUR, LABSPEC, PHURINE, GLUCOSEU, HGBUR, BILIRUBINUR, KETONESUR, PROTEINUR, UROBILINOGEN, NITRITE, LEUKOCYTESUR,  in the last 72 hours    Imaging: US Renal  05/29/2013   *RADIOLOGY REPORT*   Clinical Data:  Renal failure, diabetes, chronic renal disease  RENAL/URINARY TRACT ULTRASOUND COMPLETE  Comparison:  None.  Findings:  Right Kidney:  The right kidney demonstrates mild to moderate increased echogenicity.  It measures 11 cm in length.There is no hydonephrosis.  Left Kidney:  The left kidney is difficult to visualize well.  It is difficult to ascertain its echogenicity but it does appear at least mildly echogenic.  It measures 10 cm in length.There is no hydronephrosis.  Bladder:  The bladder appears normal.  IMPRESSION: Chronic medical renal disease.   Original Report Authenticated By: Esperanza Heir, M.D.     Medications:   . sodium chloride 50 mL/hr at 05/30/13 0117   . brimonidine  1 drop Both Eyes BID  . Chlorhexidine Gluconate Cloth  6 each Topical Q0600  . docusate sodium  50 mg Oral BID  . enoxaparin (LOVENOX) injection  30 mg Subcutaneous Q24H  . insulin aspart  0-5 Units Subcutaneous TID WC  . insulin aspart  4 Units Subcutaneous TID WC  . insulin glargine  5 Units Subcutaneous BID  . isosorbide mononitrate  30 mg Oral QHS  . latanoprost  1 drop Both Eyes QHS  . levothyroxine  125 mcg Oral QAC breakfast  . linezolid  600 mg Oral Q12H  . meropenem (MERREM) IV  500 mg Intravenous Q12H  . metoprolol succinate  25 mg Oral Daily  . mupirocin ointment  1 application Nasal BID   acetaminophen, acetaminophen, alum & mag hydroxide-simeth, guaiFENesin-dextromethorphan, hydrALAZINE, labetalol, metoCLOPramide, metoprolol, morphine injection, ondansetron (ZOFRAN) IV, ondansetron, oxyCODONE, phenol  Assessment/ Plan:  67 year old female patient of [name] who is status post left femoral to anterior tibial artery bypass with translocated non-reversed vein in July 2010. She also underwent transmetatarsal amputation which healed. She was admitted to the hospital on 05/23/2013 with a worsening right foot wound that has been present for 3 months.Pt is s/p R 5th toe amputation. post  op day 2, -amputation and angioplasty without stent SFA 8/14. Vancomycin stopped due to worsening of kidney function--Trough level 21.9, 8/13 . Baseline creatinine 1.3  1. Acute Kidney Injury. Multifactorial although anticipate that renal function should  return to baseline. Renal Ultrasound unremarkable 2. IV antibiotics  I do not favor a PICC line. The risks of Mrs Rigdon needing dialysis and placement of a fistula eventually are high and hence PICC lines are strongly discouraged. If IV antibiotics have to be given the a Hickmann catheter would be preferable. 3.HTN controlled 4. Anemia stable 5. PTH pending     LOS: 8 Rawleigh Rode W @TODAY @12 :14 PM

## 2013-05-30 NOTE — Progress Notes (Signed)
I have seen the patient and reviewed the daily progress note by MS Hosie Spangle and discussed the care of the patient with them.  See below for documentation of my findings, assessment, and plans.  Subjective: She states that she is able to tolerate her diet fair without N/V/D.  Her CBG was 59 this am and she received some juice and ate a lot of food, and her pre prandial CBG was ~ 200 at lunch.  Objective: Vital signs in last 24 hours: Filed Vitals:   05/28/13 0620 05/28/13 1423 05/29/13 0551 05/29/13 1332  BP: 135/50 142/66 142/48 140/59  Pulse: 63 75 76 76  Temp: 98.1 F (36.7 C) 98.5 F (36.9 C) 98.7 F (37.1 C) 98.5 F (36.9 C)  TempSrc: Oral Oral Oral Oral  Resp: 18 18 16 16   Height:      Weight: 167 lb 15.9 oz (76.2 kg)  183 lb (83.008 kg)   SpO2: 94% 96% 97% 98%   Weight change: 15 lb (6.804 kg)  Intake/Output Summary (Last 24 hours) at 05/29/13 1437 Last data filed at 05/29/13 0932  Gross per 24 hour  Intake    930 ml  Output    750 ml  Net    180 ml   General:NAD  Lungs : CTAB, no crackles, wheezes  Heart: RRR, murmur II/VI unchanged  Abd: soft, BS x 4  LE swelling and erythema improved. Orthotic shoe in place over bandage on the right. Home orthotic shoe in place on the left.   Lab Results: Reviewed and documented in Electronic Record Micro Results: Reviewed and documented in Electronic Record Studies/Results: Reviewed and documented in Electronic Record Medications: I have reviewed the patient's current medications. Scheduled Meds: . brimonidine  1 drop Both Eyes BID  . Chlorhexidine Gluconate Cloth  6 each Topical Q0600  . DAPTOmycin (CUBICIN)  IV  500 mg Intravenous Q24H  . enoxaparin (LOVENOX) injection  30 mg Subcutaneous Q24H  . insulin aspart  0-5 Units Subcutaneous TID WC  . insulin aspart  4 Units Subcutaneous TID WC  . insulin glargine  6 Units Subcutaneous BID  . isosorbide mononitrate  30 mg Oral QHS  . latanoprost  1 drop Both Eyes QHS   . levothyroxine  125 mcg Oral QAC breakfast  . meropenem (MERREM) IV  1 g Intravenous Q12H  . metoCLOPramide  5 mg Oral TID AC  . metoprolol succinate  25 mg Oral Daily  . mupirocin ointment  1 application Nasal BID   Continuous Infusions: . sodium chloride 50 mL/hr at 05/28/13 1030   PRN Meds:.acetaminophen, acetaminophen, alum & mag hydroxide-simeth, docusate sodium, guaiFENesin-dextromethorphan, hydrALAZINE, labetalol, metoprolol, morphine injection, ondansetron (ZOFRAN) IV, ondansetron, oxyCODONE, phenol Assessment/Plan:  Ms Rubis is a 67 yo with poorly controlled Type I DM, CAD s/p CABG, PVD s/p L fem to anterior tibial artery bypass, L forefoot amputation in 2011, and CKD who presents from clinic with right foot ulcer evaluation. MRI indicated OM.   # Elevated CK--trending down    She denies muscle pain. Her Daptomycin, Lipitor and Zetia were D/C'd on 05/29/13.  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  -  Appreciate ID consult -  -- Blood culture x 2 NGTD (Final) -- ABX  Zosyn<<8/9 stopped  Vancomycin<<8/9-8/11  Daptomycin<<8/13--8/15 Meropenem<< 8/14-- Linezolid <<05/30/13  #PVD - Angiogram 8/11  05/27/13 S/P atherectomy and  angioplasty of the right superficial femoral artery  05/27/13 S/P Ray amputation right fifth toe  - Appreciate VVS input!!  - defer to VVS for management   # Hyponatremia , corrected  - follow up BMP   # AKI on CKD (Stage III)-- likely multifactorial. Medication induced vs pre renal, and the contrast that she received on 05/25/13 and 05/27/13 would complicate it as well.    Recent Labs Lab 05/26/13 0440 05/27/13 0535 05/28/13 0515 05/29/13 0500 05/30/13 0981  CREATININE 1.85* 1.70* 1.51* 1.74* 1.82*   - Appreciate renal input!! - D/C all Nephrotoxic medications  - Monitor the fluid status closely-- Last Echo  11/14/11: EF 30-35%, grade 2 diastolic dysfunction), moderate to severe AS.  - Gentle fluid replacement NS 50/h  - BMP daily  .   # Uncontrolled DM type 1 -- Her CBG was low this am, which actually correlates with the removal of source of infection ( stress hyperglycemia) .  - Lantus decreased to 5 BID - Novolog prandial coverage 4 TID, 2 units with 1/2 tray  - Customized scale is ordered--sensitivity factor is 60 now.   # Anemia   Recent Labs Lab 05/28/13 0515 05/29/13 0500 05/30/13 0635  HGB 10.6* 10.5* 11.0*  HCT 30.6* 31.5* 31.8*  WBC 9.5 9.7 8.0  PLT 214 212 218    - patient was admitted with HH 12.6/36.8  - it remains on 11.1/32.1, Likely associated with dilutional effect from her IVF infusion since admission.  - follow CBC since she is s/p Ray amputation right fifth toe on 05/27/13.   #CHF - Stable.  - Appreciate cardiology consult  - Continue home mdur, metoprolol  - D/C Lasix due to AKI  - Seen by Cards over weekend, appreciate their input, listed patient as moderate risk for surgery  - Echo on 05/29/13 indicated EF 30% and diastolic dysfunction.   #HLD - Stop Lipitor and Zetia.   #Hypothyroidism - continue home synthroid   #DVT PPX - Lovenox per VVS   Dispo: Disposition is deferred at this time, awaiting improvement of current medical problems.  Anticipated discharge in approximately 2-3 day(s).   The patient does have a current PCP [name], MD) and does need an [facility] hospital follow-up appointment after discharge.  The patient does not have transportation limitations that hinder transportation to clinic appointments.  .Services Needed at time of discharge: Y = Yes, Blank = No PT:   OT:   RN:   Equipment:   Other:     LOS: 7 days   Dede Query, MD 05/29/2013, 2:37 PM

## 2013-05-31 LAB — URINALYSIS, ROUTINE W REFLEX MICROSCOPIC
Glucose, UA: NEGATIVE mg/dL
Ketones, ur: NEGATIVE mg/dL
Leukocytes, UA: NEGATIVE
Nitrite: NEGATIVE
Protein, ur: 100 mg/dL — AB
pH: 5.5 (ref 5.0–8.0)

## 2013-05-31 LAB — BASIC METABOLIC PANEL
Calcium: 9.1 mg/dL (ref 8.4–10.5)
GFR calc Af Amer: 30 mL/min — ABNORMAL LOW (ref >90)
GFR calc non Af Amer: 26 mL/min — ABNORMAL LOW (ref >90)
Potassium: 5.1 mEq/L (ref 3.5–5.1)
Sodium: 135 mEq/L (ref 135–145)

## 2013-05-31 LAB — CBC
HCT: 33.7 % — ABNORMAL LOW (ref 36.0–46.0)
Hemoglobin: 11.3 g/dL — ABNORMAL LOW (ref 12.0–15.0)
MCV: 92.1 fL (ref 78.0–100.0)
RDW: 15 % (ref 11.5–15.5)
WBC: 8.5 10*3/uL (ref 4.0–10.5)

## 2013-05-31 LAB — GLUCOSE, CAPILLARY
Glucose-Capillary: 85 mg/dL (ref 70–99)
Glucose-Capillary: 108 mg/dL — ABNORMAL HIGH (ref 70–99)
Glucose-Capillary: 117 mg/dL — ABNORMAL HIGH (ref 70–99)
Glucose-Capillary: 63 mg/dL — ABNORMAL LOW (ref 70–99)

## 2013-05-31 LAB — URINE MICROSCOPIC-ADD ON

## 2013-05-31 LAB — RENAL FUNCTION PANEL
BUN: 36 mg/dL — ABNORMAL HIGH (ref 6–23)
CO2: 20 mEq/L (ref 19–32)
Chloride: 106 mEq/L (ref 96–112)
Glucose, Bld: 113 mg/dL — ABNORMAL HIGH (ref 70–99)
Phosphorus: 3.8 mg/dL (ref 2.3–4.6)
Potassium: 4.8 mEq/L (ref 3.5–5.1)

## 2013-05-31 LAB — PROTEIN / CREATININE RATIO, URINE
Creatinine, Urine: 104.92 mg/dL
Total Protein, Urine: 62.7 mg/dL

## 2013-05-31 LAB — PHOSPHORUS: Phosphorus: 3.8 mg/dL (ref 2.3–4.6)

## 2013-05-31 MED ORDER — INSULIN GLARGINE 100 UNIT/ML ~~LOC~~ SOLN
4.0000 [IU] | Freq: Two times a day (BID) | SUBCUTANEOUS | Status: DC
  Administered 2013-05-31 – 2013-06-02 (×5): 4 [IU] via SUBCUTANEOUS
  Filled 2013-05-31 (×6): qty 0.04

## 2013-05-31 NOTE — Progress Notes (Signed)
Patient ID: [name], female   DOB: December 20, 1945, 68 y.o.   MRN: [id number] Right 5th toe amp looks very good.  Edges healing well with no ischemia. Walking with Darko shoe

## 2013-05-31 NOTE — Progress Notes (Signed)
2:36 AM  Paged regarding pt having 3 seconds of torsades at 00:18; pt was asymptomatic-no CP, SOB, palpitations.  Pt EKG:  no acute changes.   Chest: CTAB Cardiac: RRR  K: 5.1 Phos: 3.8 Mg: 2.4   A/P: Three seconds of Torsades  -Check: BMET, Phos, Mg  -Continue to monitor

## 2013-05-31 NOTE — Progress Notes (Signed)
Patient ID: [name], female   DOB: [birth year], 67 y.o.   MRN: [id number]   Subjective: Per night team, had tele event concerning for Torsades. Pt was asymptomatic during the event. On review, it was likely artifact. Pt has no cardiac complaints, CP, SOB, palpitations today.  Tolerating diet today, though appetite remains small. Pain under good control.  No N/V/D   Objective: Vital signs in last 24 hours: Filed Vitals:   05/30/13 1450 05/30/13 2144 05/31/13 0553 05/31/13 0701  BP: 137/52 154/62 136/58   Pulse: 66 73 71   Temp: 98 F (36.7 C) 98.3 F (36.8 C) 98.4 F (36.9 C)   TempSrc: Oral Oral Oral   Resp: 20 17 16    Height:      Weight:    73.074 kg (161 lb 1.6 oz)  SpO2: 99% 96% 95%    Weight change:  discuss with nursing the importance of consistent weighing   Intake/Output Summary (Last 24 hours) at 05/31/13 1122 Last data filed at 05/31/13 0555  Gross per 24 hour  Intake   1040 ml  Output    450 ml  Net    590 ml   General:NAD  Lungs : CTAB, no crackles, wheezes  Heart: RRR, murmur II/VI unchanged  Abd: soft, BS x 4  LE: 2+ pitting edema to knee, erythema unchanged   Lab Results:  CMP     Component Value Date/Time   NA 135 05/31/2013 0645   K 4.8 05/31/2013 0645   CL 106 05/31/2013 0645   CO2 20 05/31/2013 0645   GLUCOSE 113* 05/31/2013 0645   BUN 36* 05/31/2013 0645   CREATININE 2.02* 05/31/2013 0645   CALCIUM 9.0 05/31/2013 0645   ALBUMIN 2.2* 05/31/2013 0645   GFRNONAA 24* 05/31/2013 0645   GFRAA 28* 05/31/2013 0645   8/16 CK - 240  CBC    Component Value Date/Time   WBC 8.5 05/31/2013 0645   CBG (last 3)   Recent Labs  05/30/13 2235 05/31/13 0255 05/31/13 0806  GLUCAP 96 85 108*    CREATININE: 2.02 mg/dL ABNORMAL (40/98/11 9147) Estimated creatinine clearance - Cockcroft-Gault CrCl: 25.9 mL/min  Creatinine 1.82 (8/16) 1.92 (8/17 1am)  2.02 (8/17 6am)    Urinalysis    Component Value Date/Time   COLORURINE YELLOW 05/31/2013  0612   APPEARANCEUR CLEAR 05/31/2013 0612   LABSPEC 1.016 05/31/2013 0612   PHURINE 5.5 05/31/2013 0612   GLUCOSEU NEGATIVE 05/31/2013 0612   HGBUR NEGATIVE 05/31/2013 0612   BILIRUBINUR NEGATIVE 05/31/2013 0612   KETONESUR NEGATIVE 05/31/2013 0612   PROTEINUR 100* 05/31/2013 0612   UROBILINOGEN 1.0 05/31/2013 0612   NITRITE NEGATIVE 05/31/2013 0612   LEUKOCYTESUR NEGATIVE 05/31/2013 0612   Protein:Creatine Ratio 8/17 Creatinine, Urine 104.92 mg/dL  Total Protein, Urine 82.9 mg/dL Final NO  PROTEIN CREATININE RATIO 0.60 (H)     Micro Results: Results for orders placed during the hospital encounter of 05/22/13  CULTURE, BLOOD (ROUTINE X 2)     Status: None   Collection Time    05/22/13 11:59 PM      Result Value Range Status   Specimen Description BLOOD RIGHT ARM   Final   Special Requests BOTTLES DRAWN AEROBIC ONLY [facility]   Final   Culture  Setup Time     Final   Value: 05/23/2013 11:17     Performed at [facility]   Culture     Final   Value: NO GROWTH 5 DAYS  Performed at [facility]   Report Status 05/29/2013 FINAL   Final  CULTURE, BLOOD (ROUTINE X 2)     Status: None   Collection Time    05/23/13 12:15 AM      Result Value Range Status   Specimen Description BLOOD LEFT HAND   Final   Special Requests BOTTLES DRAWN AEROBIC ONLY 2[facility]   Final   Culture  Setup Time     Final   Value: 05/23/2013 11:16     Performed at [facility]   Culture     Final   Value: NO GROWTH 5 DAYS     Performed at [facility]   Report Status 05/29/2013 FINAL   Final  SURGICAL PCR SCREEN     Status: Abnormal   Collection Time    05/23/13  6:10 AM      Result Value Range Status   MRSA, PCR POSITIVE (*) NEGATIVE Final   Staphylococcus aureus POSITIVE (*) NEGATIVE Final   Comment:            The Xpert SA Assay (FDA     approved for NASAL specimens     in patients over 38 years of age),     is one component of     a comprehensive surveillance     program.  Test  performance has     been validated by [employer] for patients greater     than or equal to 82 year old.     It is not intended     to diagnose infection nor to     guide or monitor treatment.    Medications: I have reviewed the patient's current medications. Scheduled Meds:  brimonidine  1 drop Both Eyes BID   Chlorhexidine Gluconate Cloth  6 each Topical Q0600   docusate sodium  50 mg Oral BID   enoxaparin (LOVENOX) injection  30 mg Subcutaneous Q24H   insulin aspart  0-5 Units Subcutaneous TID WC   insulin aspart  4 Units Subcutaneous TID WC   insulin glargine  4 Units Subcutaneous BID   isosorbide mononitrate  30 mg Oral QHS   latanoprost  1 drop Both Eyes QHS   levothyroxine  125 mcg Oral QAC breakfast   linezolid  600 mg Oral Q12H   meropenem (MERREM) IV  500 mg Intravenous Q12H   metoprolol succinate  25 mg Oral Daily   mupirocin ointment  1 application Nasal BID   Continuous Infusions:  sodium chloride 50 mL/hr at 05/30/13 0117   PRN Meds:.acetaminophen, acetaminophen, alum & mag hydroxide-simeth, guaiFENesin-dextromethorphan, hydrALAZINE, labetalol, metoCLOPramide, metoprolol, morphine injection, ondansetron (ZOFRAN) IV, ondansetron, oxyCODONE, phenol  Assessment/Plan: Ms Skiver is a 67 yo with poorly controlled Type I DM, CAD s/p CABG, PVD s/p L fem to anterior tibial artery bypass, L forefoot amputation in 2011, and CKD who presents from clinic with right foot ulcer evaluation. MRI indicated OM.   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  -- Appreciate vascular input, per note, wound healing well -- Appreciate ID consult  -- Blood culture x 2 negative  -- Vancomycin stopped due to worsening of kidney function--Trough level 21.9, 8/13  -- Daptomycin d/c'd due to elevated CK -- Started Linezolid 8/15 -- Meropenem 8/14 for empiric gram negative coverage   -- Renal (Dr. Hyman Hopes) agrees her likelihood of future dialysis  is high, and PICC could hinder ability for fistula in the future, suggests a Hickman if long-term access necessary  -- Discussed timing of central line placement with Dr. Merceda Elks, (infectious disease), he agreed given negative blood cultures, it is safe to put in a line now --Discussed use of contrast during Hickman catheter placement with Dr. Eppie Gibson, he said this catheter is typically placed with a small bolus of contrast at the end of the procedure to check placement, but the contrast load is much less than a CT with contrast.    #Elevated CK, resolved   Ref. Range 05/28/2013 05:15 05/29/2013 05:00 05/30/2013 06:35 05/31/2013 01:07 05/31/2013 06:45  CK Total Latest Range: 7-177 U/L  384 (H) 240 (H)  155   -d/c'd Lipitor and Zetia and Daptomycin 8/15   - if remains low, will restart tomorrow per pharmacy recs  # AKI on CKD (Stage III)--1.82 up from 1.74 yesterday, up from 1.51 8/14   Recent Labs Lab 05/28/13 0515 05/29/13 0500 05/30/13 0635 05/31/13 0107 05/31/13 0645  CREATININE 1.51* 1.74* 1.82* 1.92* 2.02*    -Appreciate Renal consult -will follow up  protein creatine ratio, phos, Calcium, PTH, per renal  - In general, this AKI is likely multifactorial. Medication induced vs pre renal vs ATN vs contrast induced  - D/C all Nephrotoxic medications - Monitor the fluid status closely-- Echo 2/15 unchanged from 11/14/11: EF 30-35%, grade 2 diastolic dysfunction), moderate to severe AS.  - BMP daily -Renal US 8/15: Chronic medical renal disease, no hydronephrosis   # Uncontrolled DM type 1  Patient reports that she has had hypoglycemia symptoms whenever her CBG is < 120. She would like her goal of FSBS to be 130-150. Given her GFR and weight, her calculated basal requirement is 8-12 daily.   -3am sugar was 85, given decrease GFR, will drop Lantus 1 unit - Lantus 4 BID - Novolog prandial coverage 4 TID  - Customized scale is  ordered--sensitivity factor is 60 now.  - Sugar control is worsened by post-op nausea and anorexia, discussed with patient the importance of taking zofran as needed and attempting to eat some with each meal  #PVD - Angiogram 8/11    05/27/13 S/P atherectomy and angioplasty of the right superficial femoral artery    05/27/13 S/P Ray amputation right fifth toe - Appreciate VVS input!! - defer to VVS for management   # Hyponatremia , corrected  - follow up BMP   # Anemia  Recent Labs Lab 05/29/13 0500 05/30/13 0635 05/31/13 0645  HGB 10.5* 11.0* 11.3*  HCT 31.5* 31.8* 33.7*  WBC 9.7 8.0 8.5  PLT 212 218 233   - patient was admitted with HH 12.6/36.8 - it remains on 11.3/33.7, Likely associated with dilutional effect from her IVF infusion since admission. - follow CBC since she is s/p Ray amputation right fifth toe on 05/27/13.  #CHF - Stable. Echo report above, mostly unchanged from past echo Jan 2013  - Appreciate cardiology consult  - Continue home mdur, metoprolol  - hold Lasix due to AKI  - Seen by Cards over weekend, appreciate their input, listed patient as moderate risk for surgery   #HLD - we were holding home Lipitor, ezetimibe until CK normalizes. It is now 155, will re-start if it remains normal tomorrow 8/18  #Hypothyroidism - continue home synthroid   #DVT PPX - Lovenox per VVS    Dispo: Disposition is deferred at this time, awaiting improvement of current medical problems.  Anticipated discharge  in approximately 2-3 day(s).   The patient does have a current PCP [name], MD) and does need an [facility] hospital follow-up appointment after discharge.  The patient does not have transportation limitations that hinder transportation to clinic appointments.  .Services Needed at time of discharge: Y = Yes, Blank = No PT:   OT:   RN:   Equipment:   Other:     LOS: 9 days   Freddi Che, [facility] MS4, Acting Intern   05/31/2013, 11:34 am

## 2013-05-31 NOTE — Progress Notes (Signed)
Received a call from Central Telemetry Monitoring regarding telemetry status.  Torsades reported on strip. Pt asymptomatic; however, Rapid Response RN notified to follow up on pt. On call doctor notified as well. STAT EKG and Labs drawn.  Patient WNL, will continue to monitor.

## 2013-05-31 NOTE — Progress Notes (Signed)
[facility] ROUNDING NOTE   Subjective:   Interval History: no complaints, quite concerned about episode of apparent  arrythmia last might  Objective:  Vital signs in last 24 hours:  Temp:  [98 F (36.7 C)-98.4 F (36.9 C)] 98.4 F (36.9 C) (08/17 0553) Pulse Rate:  [66-73] 71 (08/17 0553) Resp:  [16-20] 16 (08/17 0553) BP: (136-154)/(52-62) 136/58 mmHg (08/17 0553) SpO2:  [95 %-99 %] 95 % (08/17 0553) Weight:  [73.074 kg (161 lb 1.6 oz)] 73.074 kg (161 lb 1.6 oz) (08/17 0701)  Weight change:  Filed Weights   05/28/13 0620 05/29/13 0551 05/31/13 0701  Weight: 76.2 kg (167 lb 15.9 oz) 83.008 kg (183 lb) 73.074 kg (161 lb 1.6 oz)    Intake/Output: I/O last 3 completed shifts: In: 1280 [P.O.:480; I.V.:700; IV Piggyback:100] Out: 450 [Urine:450]   Intake/Output this shift:     CVS- RRR RS- CTA ABD- BS present soft non-distended EXT- no edema bandaged foot   Basic Metabolic Panel:  Recent Labs Lab 05/28/13 0515 05/29/13 0500 05/30/13 0635 05/31/13 0107 05/31/13 0645  NA 135 134* 135 135 135  K 4.2 4.5 4.4 5.1 4.8  CL 106 104 106 105 106  CO2 20 20 20 21 20   GLUCOSE 100* 128* 75 99 113*  BUN 34* 37* 34* 37* 36*  CREATININE 1.51* 1.74* 1.82* 1.92* 2.02*  CALCIUM 8.5 8.5 8.8 9.1 9.0  MG  --   --   --  2.4  --   PHOS  --   --  3.7 3.8 3.8    Liver Function Tests:  Recent Labs Lab 05/30/13 0635 05/31/13 0645  ALBUMIN 2.2* 2.2*   No results found for this basename: LIPASE, AMYLASE,  in the last 168 hours No results found for this basename: AMMONIA,  in the last 168 hours  CBC:  Recent Labs Lab 05/27/13 0535 05/28/13 0515 05/29/13 0500 05/30/13 0635 05/31/13 0645  WBC 7.3 9.5 9.7 8.0 8.5  HGB 11.1* 10.6* 10.5* 11.0* 11.3*  HCT 32.1* 30.6* 31.5* 31.8* 33.7*  MCV 90.2 91.6 92.1 90.9 92.1  PLT 198 214 212 218 233    Cardiac Enzymes:  Recent Labs Lab 05/29/13 0500 05/30/13 0635 05/31/13 0645  CKTOTAL 384* 240* 155     BNP: No components found with this basename: POCBNP,   CBG:  Recent Labs Lab 05/30/13 1725 05/30/13 2235 05/31/13 0255 05/31/13 0806 05/31/13 1224  GLUCAP 106* 96 85 108* 117*    Microbiology: Results for orders placed during the hospital encounter of 05/22/13  CULTURE, BLOOD (ROUTINE X 2)     Status: None   Collection Time    05/22/13 11:59 PM      Result Value Range Status   Specimen Description BLOOD RIGHT ARM   Final   Special Requests BOTTLES DRAWN AEROBIC ONLY  [facility]   Final   Culture  Setup Time     Final   Value: 05/23/2013 11:17     Performed at [facility]   Culture     Final   Value: NO GROWTH 5 DAYS     Performed at [facility]   Report Status 05/29/2013 FINAL   Final  CULTURE, BLOOD (ROUTINE X 2)     Status: None   Collection Time    05/23/13 12:15 AM      Result Value Range Status   Specimen Description BLOOD LEFT HAND   Final   Special Requests BOTTLES DRAWN AEROBIC ONLY 2CC   Final  Culture  Setup Time     Final   Value: 05/23/2013 11:16     Performed at [facility]   Culture     Final   Value: NO GROWTH 5 DAYS     Performed at [facility]   Report Status 05/29/2013 FINAL   Final  SURGICAL PCR SCREEN     Status: Abnormal   Collection Time    05/23/13  6:10 AM      Result Value Range Status   MRSA, PCR POSITIVE (*) NEGATIVE Final   Staphylococcus aureus POSITIVE (*) NEGATIVE Final   Comment:            The Xpert SA Assay (FDA     approved for NASAL specimens     in patients over 50 years of age),     is one component of     a comprehensive surveillance     program.  Test performance has     been validated by [employer] for patients greater     than or equal to 16 year old.     It is not intended     to diagnose infection nor to     guide or monitor treatment.    Coagulation Studies: No results found for this basename: LABPROT, INR,  in the last 72 hours  Urinalysis:  Recent Labs   05/31/13 0612  COLORURINE YELLOW  LABSPEC 1.016  PHURINE 5.5  GLUCOSEU NEGATIVE  HGBUR NEGATIVE  BILIRUBINUR NEGATIVE  KETONESUR NEGATIVE  PROTEINUR 100*  UROBILINOGEN 1.0  NITRITE NEGATIVE  LEUKOCYTESUR NEGATIVE      Imaging: US Renal  05/29/2013   *RADIOLOGY REPORT*  Clinical Data:  Renal failure, diabetes, chronic renal disease  RENAL/URINARY TRACT ULTRASOUND COMPLETE  Comparison:  None.  Findings:  Right Kidney:  The right kidney demonstrates mild to moderate increased echogenicity.  It measures 11 cm in length.There is no hydonephrosis.  Left Kidney:  The left kidney is difficult to visualize well.  It is difficult to ascertain its echogenicity but it does appear at least mildly echogenic.  It measures 10 cm in length.There is no hydronephrosis.  Bladder:  The bladder appears normal.  IMPRESSION: Chronic medical renal disease.   Original Report Authenticated By: Esperanza Heir, M.D.     Medications:   . sodium chloride 50 mL/hr at 05/30/13 0117   . brimonidine  1 drop Both Eyes BID  . Chlorhexidine Gluconate Cloth  6 each Topical Q0600  . docusate sodium  50 mg Oral BID  . enoxaparin (LOVENOX) injection  30 mg Subcutaneous Q24H  . insulin aspart  0-5 Units Subcutaneous TID WC  . insulin aspart  4 Units Subcutaneous TID WC  . insulin glargine  4 Units Subcutaneous BID  . isosorbide mononitrate  30 mg Oral QHS  . latanoprost  1 drop Both Eyes QHS  . levothyroxine  125 mcg Oral QAC breakfast  . linezolid  600 mg Oral Q12H  . meropenem (MERREM) IV  500 mg Intravenous Q12H  . metoprolol succinate  25 mg Oral Daily  . mupirocin ointment  1 application Nasal BID   acetaminophen, acetaminophen, alum & mag hydroxide-simeth, guaiFENesin-dextromethorphan, hydrALAZINE, labetalol, metoCLOPramide, metoprolol, morphine injection, ondansetron (ZOFRAN) IV, ondansetron, oxyCODONE, phenol  Assessment/ Plan:  67 year old female patient of [name] who is status post left femoral to  anterior tibial artery bypass with translocated non-reversed vein in July 2010. She also underwent transmetatarsal amputation which healed. She was  admitted to the hospital on 05/23/2013 with a worsening right foot wound that has been present for 3 months.Pt is s/p R 5th toe amputation. post op day 2, -amputation and angioplasty without stent SFA 8/14. Vancomycin stopped due to worsening of kidney function--Trough level 21.9, 8/13 . Baseline creatinine 1.3    1. Acute Kidney Injury. Multifactorial although anticipate that renal function should return to baseline. Renal Ultrasound unremarkable  2. IV antibiotics I do not favor a PICC line. The risks of Mrs Kostka needing dialysis and placement of a fistula eventually are high and hence PICC lines are strongly discouraged. If IV antibiotics have to be given the a Hickmann catheter would be preferable.  3.HTN controlled  4. Anemia stable  5. PTH pending  Nothing to add from renal perspective, I do think that the creatinine will improve with time. I will sign off. Please call us back if creatinine fails to return to baseline.    LOS: 9 Wayden Schwertner W @TODAY @1 :52 PM

## 2013-05-31 NOTE — Significant Event (Signed)
Rapid Response Event Note  Called by primary RN to review tele strip  Overview: Time Called: 0034 Arrival Time: 0045 Event Type: Cardiac  Initial Focused Assessment:  On evaluation pt appears to have had 3-4 short bursts of tourades in a 1-2 min period.  Primary MD contacted & came to see  Interventions: EKG Chemistry Mag level Mag IV   Event Summary: Name of Physician Notified: [name] at 0050    at    Outcome: Stayed in room and stabalized     [name]

## 2013-05-31 NOTE — Progress Notes (Signed)
I have seen the patient and reviewed the daily progress note by MS 4 Hosie Spangle and discussed the care of the patient with them.  See below for documentation of my findings, assessment, and plans.  Subjective: Patient states that she feels okay.  No complaint.  A 3-second questionable V. tach versus torsemide versus artifact observed on heart monitor last night.  Patient was asymptomatic, VSS, no electrolytes imbalance and EKG was unremarkable. No acute events overnight.  Objective: Vital signs in last 24 hours: Filed Vitals:   05/30/13 1450 05/30/13 2144 05/31/13 0553 05/31/13 0701  BP: 137/52 154/62 136/58   Pulse: 66 73 71   Temp: 98 F (36.7 C) 98.3 F (36.8 C) 98.4 F (36.9 C)   TempSrc: Oral Oral Oral   Resp: 20 17 16    Height:      Weight:    161 lb 1.6 oz (73.074 kg)  SpO2: 99% 96% 95%    Weight change:   Intake/Output Summary (Last 24 hours) at 05/31/13 1423 Last data filed at 05/31/13 0555  Gross per 24 hour  Intake    750 ml  Output    450 ml  Net    300 ml   General:NAD  Lungs : CTAB, no crackles, wheezes  Heart: RRR, murmur II/VI unchanged  Abd: soft, BS x 4  LE swelling and erythema improved. Orthotic shoe in place over bandage on the right. Home orthotic shoe in place on the left.   Lab Results: Reviewed and documented in Electronic Record Micro Results: Reviewed and documented in Electronic Record Studies/Results: Reviewed and documented in Electronic Record Medications: I have reviewed the patient's current medications. Scheduled Meds: . brimonidine  1 drop Both Eyes BID  . Chlorhexidine Gluconate Cloth  6 each Topical Q0600  . docusate sodium  50 mg Oral BID  . enoxaparin (LOVENOX) injection  30 mg Subcutaneous Q24H  . insulin aspart  0-5 Units Subcutaneous TID WC  . insulin aspart  4 Units Subcutaneous TID WC  . insulin glargine  4 Units Subcutaneous BID  . isosorbide mononitrate  30 mg Oral QHS  . latanoprost  1 drop Both Eyes QHS  .  levothyroxine  125 mcg Oral QAC breakfast  . linezolid  600 mg Oral Q12H  . meropenem (MERREM) IV  500 mg Intravenous Q12H  . metoprolol succinate  25 mg Oral Daily  . mupirocin ointment  1 application Nasal BID   Continuous Infusions: . sodium chloride 50 mL/hr at 05/30/13 0117   PRN Meds:.acetaminophen, acetaminophen, alum & mag hydroxide-simeth, guaiFENesin-dextromethorphan, hydrALAZINE, labetalol, metoCLOPramide, metoprolol, morphine injection, ondansetron (ZOFRAN) IV, ondansetron, oxyCODONE, phenol Assessment/Plan:  Ms Dannenberg is a 67 yo with poorly controlled Type I DM, CAD s/p CABG, PVD s/p L fem to anterior tibial artery bypass, L forefoot amputation in 2011, and CKD who presents from clinic with right foot ulcer evaluation. MRI indicated OM.   # Ventricular arrhythmia vs artifact--     Patient's heart monitor showed questionable ventricular arrhythmia versus Torsade de pointes versus artifact on 05/31/13. The patient was asymptomatic during the event and follow up EKG (12 lead) immediately after was unremarkable except for T-wave abnormality in lead 1, aVL, V4, V5, V6, which is consistent with her old EKG in the past. Her QTc was ~ 450. Her Jahmiya Guidotti was 135, K was 4.8, and Mg was 2.2.       Patient has had an extensive cardiac history involving CAD, status post CABG with a cardiac cath in  December 2012 showing patent LIMA to LAD with occlusion of the distal LAD, high-grade stenosis of a nondominant RCA, significant disease or proximal left circumflex, SVG to L PDA occluded, SVG to OM patent as well as moderate aortic stenosis.     It was determined that the patient would be a very poor candidate for surgical revascularization and medical treatment was continued.  An echocardiogram in 2013 showed EF of 30% with a grade 2 diastolic dysfunction and moderate aortic stenosis.  Her repeat echocardiogram this admission shows the same.  Her cardiologist is [facility] [name].     We cubsided [facility] cardiology  on-call Dr. Daleen Squibb. The patient's cardiology office notes, echo reports, and cardiac cath reports, current meds and hospital course were reviewed and the case was discussed with Dr. Daleen Squibb, who indicated that patient is certainly at risk for ventricular arrhythmia given her extensive cardiac history and low EF.  However, the rhythm strips are not very suggestive of ventricular arrhythmia or Torsade de pointes.  It could be artifacts.  - Continue heart monitoring - [facility] will be standby for questions. - Will DC Reglan as it could cause many cardiac vascular adverse effects including QT prolongation and Torsade.    (patient never received it).     # Elevated CK--normalized She denies muscle pain. Her Daptomycin, Lipitor and Zetia were D/C'd on 05/29/13.  - CK daily--will stop checking it.   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  - Appreciate ID consult -  -- Blood culture x 2 NGTD (Final)  -- ABX  Zosyn<<8/9 stopped  Vancomycin<<8/9-8/11  Daptomycin<<8/13--8/15  Meropenem<< 8/14--  Linezolid <<05/30/13   #PVD - Angiogram 8/11  05/27/13 S/P atherectomy and angioplasty of the right superficial femoral artery  05/27/13 S/P Ray amputation right fifth toe  - Appreciate VVS input!!  - defer to VVS for management   # Hyponatremia , corrected  - follow up BMP   # AKI on CKD (Stage III)--  likely multifactorial. Medication induced vs pre renal, and the contrast that she received on 05/25/13 and 05/27/13 would complicate it as well.    Recent Labs Lab 05/28/13 0515 05/29/13 0500 05/30/13 0635 05/31/13 0107 05/31/13 0645  CREATININE 1.51* 1.74* 1.82* 1.92* 2.02*   - Appreciate renal input!!  - D/C all Nephrotoxic medications  - Monitor the fluid status closely-- Last Echo 11/14/11: EF 30-35%, grade 2 diastolic dysfunction), moderate to severe AS.   - BMP daily  .   # Uncontrolled DM type 1 --  Her CBG was 85 at 3 am and 108 this am. Denies s/s hypoglycemia. She needs less insulin given low GFR and removal of source of infection. I think that she is in a good BG control. However, she does not want her FSBS to be at 90's. She adamantly insists that her fasting BG should be at 120's-150's. We have discussed the benefit of wound healing with better control of DM. However, she refuses to have her CBG controlled at 90's.   - Lantus decreased to 4 BID  - Novolog prandial coverage 4 TID, 2 units with 1/2 tray  - Customized scale is ordered--sensitivity factor is 60 .   # Anemia   Recent Labs Lab 05/29/13 0500 05/30/13 0635 05/31/13 0645  HGB 10.5* 11.0* 11.3*  HCT 31.5* 31.8* 33.7*  WBC 9.7 8.0 8.5  PLT 212 218 233   -  patient was admitted with [facility] 12.6/36.8  - it remains on 11.1/32.1, Likely associated with dilutional effect from her IVF infusion since admission.  - follow CBC since she is s/p Ray amputation right fifth toe on 05/27/13.   #CHF - Stable.  - Appreciate cardiology consult  - Continue home mdur, metoprolol  - D/C Lasix due to AKI  - Seen by Cards over weekend, appreciate their input, listed patient as moderate risk for surgery  - Echo on 05/29/13 indicated EF 30% and diastolic dysfunction.   #HLD - Stop Lipitor and Zetia.   #Hypothyroidism - continue home synthroid   #DVT PPX - Lovenox per VVS    Dispo: Disposition is deferred at this time, awaiting improvement of current medical problems.  Anticipated discharge in approximately 2-3 day(s).   The patient does have a current PCP [name], MD) and does need an [facility] hospital follow-up appointment after discharge.  The patient does not have transportation limitations that hinder transportation to clinic appointments.  .Services Needed at time of discharge: Y = Yes, Blank = No PT:   OT:   RN:   Equipment:   Other:     LOS: 9 days   Dede Query, MD 05/31/2013, 2:23 PM

## 2013-06-01 ENCOUNTER — Encounter (HOSPITAL_COMMUNITY): Payer: Self-pay | Admitting: Radiology

## 2013-06-01 ENCOUNTER — Inpatient Hospital Stay (HOSPITAL_COMMUNITY): Payer: Medicare Other

## 2013-06-01 LAB — CBC
MCH: 31.3 pg (ref 26.0–34.0)
MCHC: 34.5 g/dL (ref 30.0–36.0)
MCV: 90.7 fL (ref 78.0–100.0)
Platelets: 251 10*3/uL (ref 150–400)

## 2013-06-01 LAB — RENAL FUNCTION PANEL
Albumin: 2.1 g/dL — ABNORMAL LOW (ref 3.5–5.2)
Calcium: 8.7 mg/dL (ref 8.4–10.5)
GFR calc Af Amer: 28 mL/min — ABNORMAL LOW (ref >90)
GFR calc non Af Amer: 24 mL/min — ABNORMAL LOW (ref >90)
Phosphorus: 4.1 mg/dL (ref 2.3–4.6)
Potassium: 4.8 mEq/L (ref 3.5–5.1)
Sodium: 134 mEq/L — ABNORMAL LOW (ref 135–145)

## 2013-06-01 LAB — GLUCOSE, CAPILLARY
Glucose-Capillary: 100 mg/dL — ABNORMAL HIGH (ref 70–99)
Glucose-Capillary: 134 mg/dL — ABNORMAL HIGH (ref 70–99)
Glucose-Capillary: 120 mg/dL — ABNORMAL HIGH (ref 70–99)

## 2013-06-01 LAB — CK: Total CK: 94 U/L (ref 7–177)

## 2013-06-01 MED ORDER — SODIUM CHLORIDE 0.9 % IJ SOLN
10.0000 mL | INTRAMUSCULAR | Status: DC | PRN
  Administered 2013-06-01 – 2013-06-02 (×3): 10 mL

## 2013-06-01 MED ORDER — MIDAZOLAM HCL 2 MG/2ML IJ SOLN
INTRAMUSCULAR | Status: AC | PRN
  Administered 2013-06-01: 0.5 mg via INTRAVENOUS
  Administered 2013-06-01: 2 mg via INTRAVENOUS

## 2013-06-01 MED ORDER — FENTANYL CITRATE 0.05 MG/ML IJ SOLN
INTRAMUSCULAR | Status: AC | PRN
  Administered 2013-06-01 (×2): 50 ug via INTRAVENOUS

## 2013-06-01 MED ORDER — HEPARIN SODIUM (PORCINE) 1000 UNIT/ML IJ SOLN
INTRAMUSCULAR | Status: AC
  Filled 2013-06-01: qty 1

## 2013-06-01 MED ORDER — SODIUM CHLORIDE 0.9 % IJ SOLN
10.0000 mL | Freq: Two times a day (BID) | INTRAMUSCULAR | Status: DC
  Administered 2013-06-02: 10 mL

## 2013-06-01 NOTE — Progress Notes (Signed)
OT Cancellation Note  Patient Details Name: [name] MRN: [id number] DOB: 1946-02-15   Cancelled Treatment:    Reason Eval/Treat Not Completed: Patient at procedure or test/ unavailable, will reattempt tomorrow  Jeani Hawking M [phone] 06/01/2013, 2:13 PM

## 2013-06-01 NOTE — Progress Notes (Signed)
  Date: 06/01/2013  Patient name: [name]  Medical record number: [id number]  Date of birth: [birth year]   This patient has been seen and the plan of care was discussed with the house staff. Please see their note for complete details. I concur with their findings with the following additions/corrections:  I discussed the current plan of long term treatment with IV abx per ID with patient. I also discussed plans to place a catheter for administration of IV antibiotics. I explained to her the risks of benefits of proceeding with catheter placement and long term abx. I explained to her that she has the option not to receive IV abx, though it is our strong recommendation that she have long term IV abx.  Options of less than ideal PO abx given.  She was explained alternatives to current therapy.  She has made an informed decision to proceed with catheter placement and follow Infectious disease recommendations for IV abx therapy. Her renal function has reached a plateau. Nephro has been consulted. 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  I have explained to her the need for continued monitoring of renal function while on IV abx therapy. I have also explained the need for continue CBC monitoring given abx treatment. All risks and benefits explained.  I spent 20 minutes in the room with her today.   [name], DO 06/01/2013, 2:05 PM

## 2013-06-01 NOTE — Care Management Note (Signed)
  Page 2 of 2   06/02/2013     2:23:32 PM   CARE MANAGEMENT NOTE 06/02/2013  Patient:  [name], [name]   Account Number:  [id number]  Date Initiated:  06/02/2013  Documentation initiated by:  Ronny Flurry  Subjective/Objective Assessment:   R superficial femoral artery endovascular treatment of her severe stenosis & R 5th toe amputation 8/13 in OR.     Action/Plan:   Anticipated DC Date:  06/02/2013   Anticipated DC Plan:  HOME W HOME HEALTH SERVICES         Choice offered to / List presented to:  C-1 Patient        HH arranged  HH-1 RN      [facility] agency  [facility]   Status of service:  Completed, signed off Medicare Important Message given?   (If response is "NO", the following Medicare IM given date fields will be blank) Date Medicare IM given:   Date Additional Medicare IM given:    Discharge Disposition:    Per UR Regulation:    If discussed at Long Length of Stay Meetings, dates discussed:    Comments:  06-02-13 Okey Regal from Dr Bosie Helper office called . Okey Regal discussed wound care with Dr Arbie Cookey , patient has closed wound no need for wound care by home health nurse .  Okey Regal discussed this with patient . Carol recommended dry dressing change daily , patinet has assistance for this at home .  Ronny Flurry RN BSN [phone]     06-01-13 Consult for IV antibiotic x 4 weeks . Spoke to patient , patient has had [facility] in the past and would like to continue with [facility] . Patient very concerned regarding cost . Spoke with Hilda Lias at [employer] , [facility] will run patient's policy and call her with any out of pocket expense .  MD will need order for home health RN for IV antibiotics and prescription for IV antibiotics including duration , and wound care . Will aslo need face to face completed.   Thanks Ronny Flurry RN BSN [phone]

## 2013-06-01 NOTE — Progress Notes (Signed)
I have seen the patient and reviewed the daily progress note by [name] Hosie Spangle and discussed the care of the patient with them.  See below for documentation of my findings, assessment, and plans.  Subjective: Feeling ok. Discussed about the insertion of hickman CVP line for long term ABx. Objective: Vital signs in last 24 hours: Filed Vitals:   06/01/13 1426 06/01/13 1433 06/01/13 1437 06/01/13 1524  BP: 169/67  169/62 168/60  Pulse: 62 59 61 59  Temp:    97.5 F (36.4 C)  TempSrc:    Oral  Resp: 16 11 14 18   Height:      Weight:      SpO2: 94% 95% 93% 95%   Weight change:   Intake/Output Summary (Last 24 hours) at 06/01/13 2106 Last data filed at 06/01/13 1400  Gross per 24 hour  Intake    640 ml  Output    650 ml  Net    -10 ml   General:NAD  Lungs : CTAB, no crackles, wheezes  Heart: RRR, murmur II/VI unchanged  Abd: soft, BS x 4  LE swelling and erythema improved. Orthotic shoe in place over bandage on the right. Home orthotic shoe in place on the left.   Lab Results: Reviewed and documented in Electronic Record Micro Results: Reviewed and documented in Electronic Record Studies/Results: Reviewed and documented in Electronic Record Medications: I have reviewed the patient's current medications. Scheduled Meds: . brimonidine  1 drop Both Eyes BID  . docusate sodium  50 mg Oral BID  . enoxaparin (LOVENOX) injection  30 mg Subcutaneous Q24H  . heparin      . insulin aspart  0-5 Units Subcutaneous TID WC  . insulin aspart  4 Units Subcutaneous TID WC  . insulin glargine  4 Units Subcutaneous BID  . isosorbide mononitrate  30 mg Oral QHS  . latanoprost  1 drop Both Eyes QHS  . levothyroxine  125 mcg Oral QAC breakfast  . linezolid  600 mg Oral Q12H  . meropenem (MERREM) IV  500 mg Intravenous Q12H  . metoprolol succinate  25 mg Oral Daily  . sodium chloride  10-40 mL Intracatheter Q12H   Continuous Infusions: . 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 MRI indicated OM.   # Ventricular arrhythmia vs artifact-- stable. No more events  - Continue heart monitoring  - [facility] will be standby for questions.  - will need cardiology f/u appt as an outpatient  # Elevated CK--normalized   Her Daptomycin, Lipitor and Zetia were D/C'd on 05/29/13.   # OM and cellulitis associated with Diabetic right midfoot ulcer  -- Appreciate vascular input!!--POD # 5 Right 5th toe ray amputation and stent SFA 05/27/13.  -  Appreciate ID consult -  -- Blood culture x 2 NGTD (Final)  --  ABX  Zosyn<<8/9 stopped  Vancomycin<<8/9-8/11  Daptomycin<<8/13--8/15  Meropenem<< 8/14--  Linezolid <<05/30/13   #PVD - Angiogram 8/11  05/27/13 S/P atherectomy and angioplasty of the right superficial femoral artery  05/27/13 S/P Ray amputation right fifth toe  - Appreciate VVS input!!  - defer to VVS for management   # Hyponatremia , corrected  - follow up BMP   # AKI on CKD (Stage III)-- Cr plateau, hopefully trending down soon  likely multifactorial. Medication induced vs pre renal, and the contrast that she received on 05/25/13 and 05/27/13 would complicate it as well.    Recent Labs Lab 05/29/13 0500  05/30/13 0635 05/31/13 0107 05/31/13 0645 06/01/13 0640  CREATININE 1.74* 1.82* 1.92* 2.02* 2.02*    - Appreciate renal input!!  - D/C all Nephrotoxic medications  - Monitor the fluid status closely-- Last Echo 11/14/11: EF 30-35%, grade 2 diastolic dysfunction), moderate to severe AS.  - BMP daily  .  # Uncontrolled DM type 1 -- good CBG control  - Lantus decreased to 4 BID  - Novolog prandial coverage 4 TID, 2 units with 1/2 tray  - Customized scale is ordered--sensitivity factor is 60 .   # Anemia -stable  Recent Labs  Lab  05/29/13 0500  05/30/13 0635  05/31/13 0645   HGB  10.5*  11.0*  11.3*   HCT  31.5*  31.8*  33.7*   WBC  9.7  8.0  8.5   PLT  212  218  233    - patient was admitted with HH 12.6/36.8  - it remains on 11.1/32.1, Likely associated with dilutional effect from her IVF infusion since admission.  - follow CBC since she is s/p Ray amputation right fifth toe on 05/27/13.   #CHF - Stable.  - Appreciate cardiology consult  - Continue home mdur, metoprolol  - D/C Lasix due to AKI  - Seen by Cards over weekend, appreciate their input, listed patient as moderate risk for surgery  - Echo on 05/29/13 indicated EF 30% and diastolic dysfunction.   #HLD - Stop Lipitor and Zetia.   #Hypothyroidism - continue home synthroid   #DVT PPX - Lovenox per VVS    Dispo: Disposition is deferred at this time, awaiting improvement of current medical problems.  Anticipated discharge in approximately 1-2 day(s).   The patient does have a current PCP [name], MD) and does not need an [facility] hospital follow-up appointment after discharge.  The patient does not have transportation limitations that hinder transportation to clinic appointments.  .Services Needed at time of discharge: Y = Yes, Blank = No PT:   OT:   RN:   Equipment:   Other:     LOS: 10 days   [name] Query, MD 06/01/2013, 9:06 PM

## 2013-06-01 NOTE — Procedures (Signed)
RIJV Hickman Tip SVC RA No comp

## 2013-06-01 NOTE — Progress Notes (Signed)
Patient ID: [name], female   DOB: June 17, 1946, 67 y.o.   MRN: [id number]   Subjective: Appetite still decreased. No other complaints.   Objective: Vital signs in last 24 hours: Filed Vitals:   06/01/13 1403 06/01/13 1418 06/01/13 1420 06/01/13 1421  BP: 179/74 186/74 183/76 168/80  Pulse: 63 63 71 63  Temp:      TempSrc:      Resp: 20 14 23 15   Height:      Weight:      SpO2: 100% 98% 98% 91%   Weight change:  discuss with nursing the importance of consistent weighing   Intake/Output Summary (Last 24 hours) at 06/01/13 1424 Last data filed at 06/01/13 4098  Gross per 24 hour  Intake    240 ml  Output    650 ml  Net   -410 ml   General:NAD  Lungs : CTAB, no crackles, wheezes  Heart: RRR, murmur II/VI unchanged  Abd: soft, BS x 4  LE: 2+ pitting edema to knee, erythema unchanged   Lab Results:    CMP     Component Value Date/Time   NA 134* 06/01/2013 0640   K 4.8 06/01/2013 0640   CL 106 06/01/2013 0640   CO2 21 06/01/2013 0640   GLUCOSE 153* 06/01/2013 0640   BUN 37* 06/01/2013 0640   CREATININE 2.02* 06/01/2013 0640   CALCIUM 8.7 06/01/2013 0640   ALBUMIN 2.1* 06/01/2013 0640   GFRNONAA 24* 06/01/2013 0640   GFRAA 28* 06/01/2013 0640    CK --8/17 155, down from  240 on 8/16 CBC     Component Value Date/Time   WBC 7.9 06/01/2013 0640    CBG (last 3)   Recent Labs  06/01/13 0822 06/01/13 1206 06/01/13 1325  GLUCAP 123* 106* 100*    CREATININE: 2.02 mg/dL ABNORMAL (11/91/47 8295) Estimated creatinine clearance - Cockcroft-Gault CrCl: 25.9 mL/min  Creatinine 1.82 (8/16) 1.92 (8/17 1am)  2.02 (8/17 6am)    Urinalysis    Component Value Date/Time   COLORURINE YELLOW 05/31/2013 0612   APPEARANCEUR CLEAR 05/31/2013 0612   LABSPEC 1.016 05/31/2013 0612   PHURINE 5.5 05/31/2013 0612   GLUCOSEU NEGATIVE 05/31/2013 0612   HGBUR NEGATIVE 05/31/2013 0612   BILIRUBINUR NEGATIVE 05/31/2013 0612   KETONESUR NEGATIVE 05/31/2013 0612   PROTEINUR 100*  05/31/2013 0612   UROBILINOGEN 1.0 05/31/2013 0612   NITRITE NEGATIVE 05/31/2013 0612   LEUKOCYTESUR NEGATIVE 05/31/2013 0612   Protein:Creatine Ratio 8/17 Creatinine, Urine 104.92 mg/dL  Total Protein, Urine 62.1 mg/dL Final NO  PROTEIN CREATININE RATIO 0.60 (H)     Micro Results: Results for orders placed during the hospital encounter of 05/22/13  CULTURE, BLOOD (ROUTINE X 2)     Status: None   Collection Time    05/22/13 11:59 PM      Result Value Range Status   Specimen Description BLOOD RIGHT ARM   Final   Special Requests BOTTLES DRAWN AEROBIC ONLY [facility]   Final   Culture  Setup Time     Final   Value: 05/23/2013 11:17     Performed at [facility]   Culture     Final   Value: NO GROWTH 5 DAYS     Performed at [facility]   Report Status 05/29/2013 FINAL   Final  CULTURE, BLOOD (ROUTINE X 2)     Status: None   Collection Time    05/23/13 12:15 AM      Result Value  Range Status   Specimen Description BLOOD LEFT HAND   Final   Special Requests BOTTLES DRAWN AEROBIC ONLY [facility]   Final   Culture  Setup Time     Final   Value: 05/23/2013 11:16     Performed at [facility]   Culture     Final   Value: NO GROWTH 5 DAYS     Performed at [facility]   Report Status 05/29/2013 FINAL   Final  SURGICAL PCR SCREEN     Status: Abnormal   Collection Time    05/23/13  6:10 AM      Result Value Range Status   MRSA, PCR POSITIVE (*) NEGATIVE Final   Staphylococcus aureus POSITIVE (*) NEGATIVE Final   Comment:            The Xpert SA Assay (FDA     approved for NASAL specimens     in patients over 72 years of age),     is one component of     a comprehensive surveillance     program.  Test performance has     been validated by [employer] for patients greater     than or equal to 38 year old.     It is not intended     to diagnose infection nor to     guide or monitor treatment.    Medications: I have reviewed the patient's current  medications. Scheduled Meds:  brimonidine  1 drop Both Eyes BID   docusate sodium  50 mg Oral BID   enoxaparin (LOVENOX) injection  30 mg Subcutaneous Q24H   insulin aspart  0-5 Units Subcutaneous TID WC   insulin aspart  4 Units Subcutaneous TID WC   insulin glargine  4 Units Subcutaneous BID   isosorbide mononitrate  30 mg Oral QHS   latanoprost  1 drop Both Eyes QHS   levothyroxine  125 mcg Oral QAC breakfast   linezolid  600 mg Oral Q12H   meropenem (MERREM) IV  500 mg Intravenous Q12H   metoprolol succinate  25 mg Oral Daily   Continuous Infusions:  sodium chloride 50 mL/hr at 06/01/13 1021   PRN Meds:.acetaminophen, acetaminophen, alum & mag hydroxide-simeth, fentaNYL, guaiFENesin-dextromethorphan, hydrALAZINE, labetalol, metoprolol, midazolam, morphine injection, ondansetron (ZOFRAN) IV, ondansetron, oxyCODONE, phenol  Assessment/Plan: [name] is a 67 yo with poorly controlled Type I DM, CAD s/p CABG, PVD s/p L fem to anterior tibial artery bypass, L forefoot amputation in 2011, and CKD who presents from clinic with right foot ulcer evaluation. MRI indicated OM.   # OM and cellulitis associated with Diabetic right midfoot ulcer  -- post op day 5  - amputation and angioplasty without stent SFA 8/14.  -- Appreciate vascular input, per note, wound healing well -- Appreciate ID consult  -- Blood culture x 2 negative  -- Vancomycin stopped due to worsening of kidney function--Trough level 21.9, 8/13  -- Daptomycin d/c'd due to elevated CK -- Started Linezolid 8/15 -- Meropenem 8/14 for empiric gram negative coverage  --Hickman to be placed today  #Elevated CK, resolved   Ref. Range 05/28/2013 05:15 05/29/2013 05:00 05/30/2013 06:35 05/31/2013 01:07 05/31/2013 06:45  CK Total Latest Range: 7-177 U/L  384 (H) 240 (H)  155   -d/c'd Lipitor and Zetia and Daptomycin 8/15   - if remains low, will restart tomorrow per pharmacy recs  # AKI on CKD (Stage III)--2.2 up 1.36  baseline since admission  Recent Labs Lab 05/29/13 0500 05/30/13 0635 05/31/13 0107 05/31/13 0645 06/01/13 0640  CREATININE 1.74* 1.82* 1.92* 2.02* 2.02*    -Appreciate Renal consult, they believe it is plateau ing and will return to baseline  - In general, this AKI is likely multifactorial. Medication induced vs pre renal vs ATN vs contrast induced  - D/C all Nephrotoxic medications - Monitor the fluid status closely-- Echo 2/15 unchanged from 11/14/11: EF 30-35%, grade 2 diastolic dysfunction), moderate to severe AS.  - BMP daily -Renal US 8/15: Chronic medical renal disease, no hydronephrosis   # Uncontrolled DM type 1  Patient reports that she has had hypoglycemia symptoms whenever her CBG is < 120. She would like her goal of FSBS to be 130-150. Given her GFR and weight, her calculated basal requirement is 8-12 daily.   - Lantus 4 BID - Novolog prandial coverage 4 TID  - Customized scale is ordered--sensitivity factor is 60 now.   #PVD - Angiogram 8/11    05/27/13 S/P atherectomy and angioplasty of the right superficial femoral artery    05/27/13 S/P Ray amputation right fifth toe - Appreciate VVS input!! - defer to VVS for management   # Hyponatremia , corrected  - follow up BMP   # Anemia  Recent Labs Lab 05/30/13 0635 05/31/13 0645 06/01/13 0640  HGB 11.0* 11.3* 10.8*  HCT 31.8* 33.7* 31.3*  WBC 8.0 8.5 7.9  PLT 218 233 251   - patient was admitted with HH 12.6/36.8 - Likely associated with dilutional effect from her IVF infusion since admission. - follow CBC since she is s/p Ray amputation right fifth toe on 05/27/13.  #CHF - Stable. Echo report above, mostly unchanged from past echo Jan 2013  - Appreciate cardiology consult  - Continue home mdur, metoprolol  - hold Lasix due to AKI  - Seen by Cards over weekend, appreciate their input, listed patient as moderate risk for surgery   #HLD - we were holding home Lipitor, ezetimibe until CK normalizes. It is  now 155, will re-start if it remains normal tomorrow 8/18  #Hypothyroidism - continue home synthroid   #DVT PPX - Lovenox per VVS    Dispo: Disposition is deferred at this time, awaiting improvement of current medical problems.  Anticipated discharge in approximately 2-3 day(s).   The patient does have a current PCP [name], MD) and does need an [facility] hospital follow-up appointment after discharge.  The patient does not have transportation limitations that hinder transportation to clinic appointments.  .Services Needed at time of discharge: Y = Yes, Blank = No PT:   OT:   RN:   Equipment:   Other:     LOS: 10 days   Freddi Che, [employer] MS4, Acting Intern   [phone] at 2:28pm

## 2013-06-01 NOTE — H&P (Signed)
HPI: [name] is an 67 y.o. female with PMHx of poorly controlled DM1, PVD with peripheral bypass and amputation. Patient admitted with right 5th toe infection s/p amputation. ID on case and AKI with vancomycin, Nephrology on case. Request for long-term central catheter placement for IV antibiotics, Nephrology requesting hickman. Patient with history of left mastectomy. She denies any chest pain or shortness of breath. She denies any bleeding difficulties, blood in her stool or urine.   Past Medical History:  Past Medical History  Diagnosis Date  . HTN (hypertension)   . Hypothyroidism   . History of recurrent UTIs   . CAD (coronary artery disease)     s/p CABG x3 in 1993; last cath in 2010  . Scleroderma   . Bilateral carpal tunnel syndrome 1970s  . 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 Followed by [name]  . Claudication   . Aortic stenosis     0.8cm2 on echo in 10/2011  . Hyperlipidemia     on Lipitor  . Cat bite 2009    with MRSA; required I & D  . Heart murmur   . Blood transfusion     no reaction from transfusion; "when I had heart surgery" (08/28/2012)  . GERD (gastroesophageal reflux disease)   . CHF (congestive heart failure) Aug. 2012    Echo 10/2011: Mild LVH, EF 30%, apex akinetic, septal HK, grade 2 diastolic dysfunction, or functionally bicuspid aortic valve, mild aortic stenosis by gradient, severe by calculated AVA-suspect A. Aortic stenosis probably moderate, trivial MR, mild LAE, mild RAE.   [name] Cellulitis   . Anemia   . B12 deficiency anemia     B 12 injection "monthly since 05/2012" (08/28/2012)  . Iron deficiency anemia 06/13/2012    "iron infusion" (08/28/2012  . Anginal pain     "history; not currently" (08/28/2012)  . Myocardial infarction 1986    "silent"  . History of bronchitis     "I've had it twice in my life" (08/28/2012)  . Exertional dyspnea     "because of the heart  failure" (08/28/2012)  . Type 1 diabetes 10/1949  . Stomach ulcer 1981?    "on Tagament for ~ 1 yr" (08/28/2012)  . Seizure     ? seizure like event in 2010; workup included stress testing which led to repeat cath.   . Arthritis     "hands, hips, all over" (08/28/2012  . Osteoarthritis of both feet   . Breast cancer     left  . Cellulitis June 2013, Nov. 2013 and Feb. 14, 2014    Left leg    Past Surgical History:  Past Surgical History  Procedure Laterality Date  . Carpal tunnel release  ~ 1970's    bilaterally  . Mastectomy  11/1982    Left Breast  . Vitrectomy  1990's-2004    "both eyes; I've had total of 4"  . Tubal ligation  1984  . Tonsillectomy  1952    "?adenoids"   . Appendectomy  1991  . Femoral bypass      left leg "related to transmetatarsal amputation" (08/28/2012)  . Breast biopsy with sentinel lymph node biopsy and needle localization  1984  . Coronary artery bypass graft  1992    LIMA to LAD, SVG to distal LCX & SVG to Marginal  . Cardiac catheterization  2010    LIMA to LAD patent yet with  occluded distal LAD after graft insertion & retrograde is occluded. 3-4 septal perforators arise &are patent. SVG to a large marginal patent; SVG presumably to the distal dominant LCX is occluded, moderately high grade stenosis of the AV circumflex, but with distal stenoses involving a severely diseased marginal branch not amenable  to PCI  nor is inferior branch   . Cardiac catheterization  10/2011    "unsuccessful attempt of stenting" (08/28/2012)  . Cardiac catheterization  1992; 09/2011  . Breast biopsy    . Trigger finger release  1980's    right thumb  . Incision and drainage of wound  ~ 1967    "infection in left foot" (08/28/2012)  . Incision and drainage of wound  2009    "3 ORs on my right hand after cat bite" (08/28/2012)  . 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 Cataract extraction w/ intraocular lens  implant, bilateral  1990's   . Amputation Right 05/27/2013    Procedure: AMPUTATION DIGIT FIFTH TOE;  Surgeon: Larina Earthly, MD;  Location: [facility] OR;  Service: Vascular;  Laterality: Right;    Family History:  Family History  Problem Relation Age of Onset  . Diabetes Mother     Social History:  reports that she has never smoked. She has never used smokeless tobacco. She reports that she does not drink alcohol or use illicit drugs.  Allergies:  Allergies  Allergen Reactions  . Adhesive [Tape] Other (See Comments)    "old [employer] adhesive tape; takes my skin off; can use paper tape"  . Bactrim [Sulfamethoxazole W-Trimethoprim]     Hyperkalemia and Increased creatinine  . Cephalexin Swelling and Rash  . Ciprofloxacin Swelling and Rash    Medications:   Medication List    ASK your doctor about these medications       acetaminophen 325 MG tablet  Commonly known as:  TYLENOL  Take 650 mg by mouth every 6 (six) hours as needed for fever.     aspirin EC 325 MG tablet  Take 325 mg by mouth daily.     atorvastatin 40 MG tablet  Commonly known as:  LIPITOR  Take 40 mg by mouth at bedtime.     brimonidine 0.2 % ophthalmic solution  Commonly known as:  ALPHAGAN  Place 1 drop into both eyes 2 (two) times daily.     cyanocobalamin 1000 MCG/ML injection  Commonly known as:  (VITAMIN B-12)  Inject 1,000 mcg into the muscle every 30 (thirty) days.     docusate sodium 50 MG capsule  Commonly known as:  COLACE  Take by mouth 2 (two) times daily.     ezetimibe 10 MG tablet  Commonly known as:  ZETIA  Take 10 mg by mouth at bedtime.     furosemide 20 MG tablet  Commonly known as:  LASIX  Take 20-60 mg by mouth daily.     HUMALOG PEN 100 UNIT/ML injection  Generic drug:  insulin lispro  Inject 0-10 Units into the skin 4 (four) times daily. Per sliding scale DO NOT GIVE WITHOUT CONSULTING PATIENT ON HER SLIDING SCALE     ibuprofen 200 MG tablet  Commonly known as:  ADVIL,MOTRIN  Take 200 mg by  mouth every 6 (six) hours as needed for pain or fever.     insulin glargine 100 UNIT/ML injection  Commonly known as:  LANTUS  Inject 10-22 Units into the skin 2 (two) times daily. Take up  to 22 units subcutaneously each morning and 10 units each evening.     isosorbide mononitrate 30 MG 24 hr tablet  Commonly known as:  IMDUR  Take 30 mg by mouth at bedtime.     latanoprost 0.005 % ophthalmic solution  Commonly known as:  XALATAN  Place 1 drop into both eyes at bedtime.     levothyroxine 125 MCG tablet  Commonly known as:  SYNTHROID, LEVOTHROID  Take 125 mcg by mouth daily.     metoprolol succinate 25 MG 24 hr tablet  Commonly known as:  TOPROL-XL  Take 25 mg by mouth daily.     SLOW RELEASE IRON 50 MG Tbcr  Generic drug:  Ferrous Sulfate  Take 50 mg by mouth daily.        Please HPI for pertinent positives, otherwise complete 10 system ROS negative.  Physical Exam: BP 131/53  Pulse 53  Temp(Src) 97.4 F (36.3 C) (Oral)  Resp 16  Ht 5\' 3"  (1.6 m)  Wt 161 lb 1.6 oz (73.074 kg)  BMI 28.54 kg/m2  SpO2 98% Body mass index is 28.54 kg/(m^2).   General Appearance:  Alert, cooperative, no distress, appears stated age  Head:  Normocephalic, without obvious abnormality, atraumatic  Lungs:   Clear to auscultation bilaterally, no w/r/r, respirations unlabored without use of accessory muscles.  Chest Wall:  No tenderness or deformity  Heart:  Regular rate and rhythm, S1, S2 normal, no murmur, rub or gallop.  Abdomen:   Soft, non-tender, non distended.  Extremities: RLE surgical dressing intact.   Neurologic: Normal affect, no gross deficits.   Results for orders placed during the hospital encounter of 05/22/13 (from the past 48 hour(s))  GLUCOSE, CAPILLARY     Status: Abnormal   Collection Time    05/30/13  5:25 PM      Result Value Range   Glucose-Capillary 106 (*) 70 - 99 mg/dL   Comment 1 Notify RN    GLUCOSE, CAPILLARY     Status: None   Collection Time     05/30/13 10:35 PM      Result Value Range   Glucose-Capillary 96  70 - 99 mg/dL  BASIC METABOLIC PANEL     Status: Abnormal   Collection Time    05/31/13  1:07 AM      Result Value Range   Sodium 135  135 - 145 mEq/L   Potassium 5.1  3.5 - 5.1 mEq/L   Chloride 105  96 - 112 mEq/L   CO2 21  19 - 32 mEq/L   Glucose, Bld 99  70 - 99 mg/dL   BUN 37 (*) 6 - 23 mg/dL   Creatinine, Ser 1.61 (*) 0.50 - 1.10 mg/dL   Calcium 9.1  8.4 - 09.6 mg/dL   GFR calc non Af Amer 26 (*) >90 mL/min   GFR calc Af Amer 30 (*) >90 mL/min   Comment: (NOTE)     The eGFR has been calculated using the CKD EPI equation.     This calculation has not been validated in all clinical situations.     eGFR's persistently <90 mL/min signify possible Chronic Kidney     Disease.  MAGNESIUM     Status: None   Collection Time    05/31/13  1:07 AM      Result Value Range   Magnesium 2.4  1.5 - 2.5 mg/dL  PHOSPHORUS     Status: None   Collection Time    05/31/13  1:07  AM      Result Value Range   Phosphorus 3.8  2.3 - 4.6 mg/dL  GLUCOSE, CAPILLARY     Status: None   Collection Time    05/31/13  2:55 AM      Result Value Range   Glucose-Capillary 85  70 - 99 mg/dL  URINALYSIS, ROUTINE W REFLEX MICROSCOPIC     Status: Abnormal   Collection Time    05/31/13  6:12 AM      Result Value Range   Color, Urine YELLOW  YELLOW   APPearance CLEAR  CLEAR   Specific Gravity, Urine 1.016  1.005 - 1.030   pH 5.5  5.0 - 8.0   Glucose, UA NEGATIVE  NEGATIVE mg/dL   Hgb urine dipstick NEGATIVE  NEGATIVE   Bilirubin Urine NEGATIVE  NEGATIVE   Ketones, ur NEGATIVE  NEGATIVE mg/dL   Protein, ur 161 (*) NEGATIVE mg/dL   Urobilinogen, UA 1.0  0.0 - 1.0 mg/dL   Nitrite NEGATIVE  NEGATIVE   Leukocytes, UA NEGATIVE  NEGATIVE  PROTEIN / CREATININE RATIO, URINE     Status: Abnormal   Collection Time    05/31/13  6:12 AM      Result Value Range   Creatinine, Urine 104.92     Total Protein, Urine 62.7     Comment: NO NORMAL RANGE  ESTABLISHED FOR THIS TEST   PROTEIN CREATININE RATIO 0.60 (*) 0.00 - 0.15  URINE MICROSCOPIC-ADD ON     Status: None   Collection Time    05/31/13  6:12 AM      Result Value Range   Squamous Epithelial / LPF RARE  RARE   WBC, UA 0-2  <3 WBC/hpf   RBC / HPF 0-2  <3 RBC/hpf   Bacteria, UA RARE  RARE  CBC     Status: Abnormal   Collection Time    05/31/13  6:45 AM      Result Value Range   WBC 8.5  4.0 - 10.5 K/uL   RBC 3.66 (*) 3.87 - 5.11 MIL/uL   Hemoglobin 11.3 (*) 12.0 - 15.0 g/dL   HCT 09.6 (*) 04.5 - 40.9 %   MCV 92.1  78.0 - 100.0 fL   MCH 30.9  26.0 - 34.0 pg   MCHC 33.5  30.0 - 36.0 g/dL   RDW 81.1  91.4 - 78.2 %   Platelets 233  150 - 400 K/uL  CK     Status: None   Collection Time    05/31/13  6:45 AM      Result Value Range   Total CK 155  7 - 177 U/L  RENAL FUNCTION PANEL     Status: Abnormal   Collection Time    05/31/13  6:45 AM      Result Value Range   Sodium 135  135 - 145 mEq/L   Potassium 4.8  3.5 - 5.1 mEq/L   Chloride 106  96 - 112 mEq/L   CO2 20  19 - 32 mEq/L   Glucose, Bld 113 (*) 70 - 99 mg/dL   BUN 36 (*) 6 - 23 mg/dL   Creatinine, Ser 9.56 (*) 0.50 - 1.10 mg/dL   Calcium 9.0  8.4 - 21.3 mg/dL   Phosphorus 3.8  2.3 - 4.6 mg/dL   Albumin 2.2 (*) 3.5 - 5.2 g/dL   GFR calc non Af Amer 24 (*) >90 mL/min   GFR calc Af Amer 28 (*) >90 mL/min   Comment: (NOTE)  The eGFR has been calculated using the CKD EPI equation.     This calculation has not been validated in all clinical situations.     eGFR's persistently <90 mL/min signify possible Chronic Kidney     Disease.  GLUCOSE, CAPILLARY     Status: Abnormal   Collection Time    05/31/13  8:06 AM      Result Value Range   Glucose-Capillary 108 (*) 70 - 99 mg/dL  GLUCOSE, CAPILLARY     Status: Abnormal   Collection Time    05/31/13 12:24 PM      Result Value Range   Glucose-Capillary 117 (*) 70 - 99 mg/dL   Comment 1 Notify RN    GLUCOSE, CAPILLARY     Status: Abnormal   Collection Time     05/31/13  5:05 PM      Result Value Range   Glucose-Capillary 63 (*) 70 - 99 mg/dL  GLUCOSE, CAPILLARY     Status: Abnormal   Collection Time    05/31/13 10:07 PM      Result Value Range   Glucose-Capillary 114 (*) 70 - 99 mg/dL  RENAL FUNCTION PANEL     Status: Abnormal   Collection Time    06/01/13  6:40 AM      Result Value Range   Sodium 134 (*) 135 - 145 mEq/L   Potassium 4.8  3.5 - 5.1 mEq/L   Chloride 106  96 - 112 mEq/L   CO2 21  19 - 32 mEq/L   Glucose, Bld 153 (*) 70 - 99 mg/dL   BUN 37 (*) 6 - 23 mg/dL   Creatinine, Ser 1.61 (*) 0.50 - 1.10 mg/dL   Calcium 8.7  8.4 - 09.6 mg/dL   Phosphorus 4.1  2.3 - 4.6 mg/dL   Albumin 2.1 (*) 3.5 - 5.2 g/dL   GFR calc non Af Amer 24 (*) >90 mL/min   GFR calc Af Amer 28 (*) >90 mL/min   Comment: (NOTE)     The eGFR has been calculated using the CKD EPI equation.     This calculation has not been validated in all clinical situations.     eGFR's persistently <90 mL/min signify possible Chronic Kidney     Disease.  CBC     Status: Abnormal   Collection Time    06/01/13  6:40 AM      Result Value Range   WBC 7.9  4.0 - 10.5 K/uL   RBC 3.45 (*) 3.87 - 5.11 MIL/uL   Hemoglobin 10.8 (*) 12.0 - 15.0 g/dL   HCT 04.5 (*) 40.9 - 81.1 %   MCV 90.7  78.0 - 100.0 fL   MCH 31.3  26.0 - 34.0 pg   MCHC 34.5  30.0 - 36.0 g/dL   RDW 91.4  78.2 - 95.6 %   Platelets 251  150 - 400 K/uL  CK     Status: None   Collection Time    06/01/13  6:40 AM      Result Value Range   Total CK 94  7 - 177 U/L  GLUCOSE, CAPILLARY     Status: Abnormal   Collection Time    06/01/13  8:22 AM      Result Value Range   Glucose-Capillary 123 (*) 70 - 99 mg/dL  GLUCOSE, CAPILLARY     Status: Abnormal   Collection Time    06/01/13 12:06 PM      Result Value Range   Glucose-Capillary 106 (*)  70 - 99 mg/dL   No results found.  Assessment/Plan PVD with ulcer s/p Right 5th toe amputation 8/13 on IV antibiotics, ID on board. AKI, nephrology on board,  requested Hickman catheter placement rather than PICC for high risks of needing dialysis and placement of a fistula. Labs reviewed, patient NPO, on antibiotics.  Risks and Benefits discussed with the patient. All of the patient's questions were answered, patient is agreeable to proceed. Consent signed and in chart.   Pattricia Boss D PA-C 06/01/2013, 12:37 PM

## 2013-06-01 NOTE — Progress Notes (Signed)
PT Cancellation Note  Patient Details Name: [name] MRN: [id number] DOB: Jul 26, 1946   Cancelled Treatment:    Reason Eval/Treat Not Completed: Patient at procedure or test/unavailable. Patient going down for central line placement at this time. Will recheck on patient again tomorrow and plan on stair training prior to DC home.    Fredrich Birks 06/01/2013, 1:31 PM

## 2013-06-02 ENCOUNTER — Telehealth: Payer: Self-pay | Admitting: Internal Medicine

## 2013-06-02 ENCOUNTER — Telehealth: Payer: Self-pay

## 2013-06-02 DIAGNOSIS — Z452 Encounter for adjustment and management of vascular access device: Secondary | ICD-10-CM | POA: Diagnosis not present

## 2013-06-02 DIAGNOSIS — E1129 Type 2 diabetes mellitus with other diabetic kidney complication: Secondary | ICD-10-CM | POA: Diagnosis not present

## 2013-06-02 DIAGNOSIS — I96 Gangrene, not elsewhere classified: Secondary | ICD-10-CM | POA: Diagnosis not present

## 2013-06-02 DIAGNOSIS — E1159 Type 2 diabetes mellitus with other circulatory complications: Secondary | ICD-10-CM | POA: Diagnosis not present

## 2013-06-02 DIAGNOSIS — S98139A Complete traumatic amputation of one unspecified lesser toe, initial encounter: Secondary | ICD-10-CM | POA: Diagnosis not present

## 2013-06-02 DIAGNOSIS — E1169 Type 2 diabetes mellitus with other specified complication: Secondary | ICD-10-CM | POA: Diagnosis present

## 2013-06-02 DIAGNOSIS — A4902 Methicillin resistant Staphylococcus aureus infection, unspecified site: Secondary | ICD-10-CM | POA: Diagnosis not present

## 2013-06-02 LAB — RENAL FUNCTION PANEL
CO2: 18 mEq/L — ABNORMAL LOW (ref 19–32)
Calcium: 8.7 mg/dL (ref 8.4–10.5)
GFR calc Af Amer: 32 mL/min — ABNORMAL LOW (ref >90)
GFR calc non Af Amer: 28 mL/min — ABNORMAL LOW (ref >90)
Phosphorus: 4.4 mg/dL (ref 2.3–4.6)
Potassium: 4.9 mEq/L (ref 3.5–5.1)
Sodium: 134 mEq/L — ABNORMAL LOW (ref 135–145)

## 2013-06-02 LAB — BASIC METABOLIC PANEL
CO2: 20 mEq/L (ref 19–32)
Calcium: 8.9 mg/dL (ref 8.4–10.5)
Creatinine, Ser: 1.85 mg/dL — ABNORMAL HIGH (ref 0.50–1.10)
Glucose, Bld: 53 mg/dL — ABNORMAL LOW (ref 70–99)

## 2013-06-02 LAB — CBC
MCH: 31.3 pg (ref 26.0–34.0)
MCHC: 34.1 g/dL (ref 30.0–36.0)
Platelets: 262 10*3/uL (ref 150–400)
RBC: 3.51 MIL/uL — ABNORMAL LOW (ref 3.87–5.11)

## 2013-06-02 LAB — GLUCOSE, CAPILLARY: Glucose-Capillary: 85 mg/dL (ref 70–99)

## 2013-06-02 MED ORDER — SODIUM CHLORIDE 0.9 % IV SOLN: 500.0000 mg | Freq: Two times a day (BID) | INTRAVENOUS | Status: DC

## 2013-06-02 MED ORDER — INSULIN ASPART 100 UNIT/ML ~~LOC~~ SOLN: 0.0000 [IU] | Freq: Three times a day (TID) | SUBCUTANEOUS | Status: DC

## 2013-06-02 MED ORDER — LINEZOLID 600 MG PO TABS: 600.0000 mg | ORAL_TABLET | Freq: Two times a day (BID) | ORAL | Status: DC

## 2013-06-02 MED ORDER — INSULIN GLARGINE 100 UNIT/ML ~~LOC~~ SOLN: 4.0000 [IU] | Freq: Two times a day (BID) | SUBCUTANEOUS | Status: DC

## 2013-06-02 MED ORDER — HEPARIN SOD (PORK) LOCK FLUSH 100 UNIT/ML IV SOLN
250.0000 [IU] | INTRAVENOUS | Status: AC | PRN
  Administered 2013-06-02: 500 [IU]

## 2013-06-02 MED ORDER — OXYCODONE HCL 10 MG PO TABS: 10.0000 mg | ORAL_TABLET | Freq: Four times a day (QID) | ORAL | Status: DC | PRN

## 2013-06-02 MED ORDER — FUROSEMIDE 20 MG PO TABS: 20.0000 mg | ORAL_TABLET | Freq: Two times a day (BID) | ORAL | Status: DC

## 2013-06-02 MED ORDER — INSULIN ASPART 100 UNIT/ML ~~LOC~~ SOLN: 4.0000 [IU] | Freq: Three times a day (TID) | SUBCUTANEOUS | Status: DC

## 2013-06-02 MED FILL — Heparin Sodium (Porcine) Inj 1000 Unit/ML: INTRAMUSCULAR | Qty: 10 | Status: AC

## 2013-06-02 NOTE — Telephone Encounter (Signed)
Pt. called to state she is being discharged from the hospital today, and questions if orders have been placed for [facility] to do wound care.   Reviewed Hospital Discharge note and Case Management note.  No noted wound care orders were documented for discharge.  Discussed w/ [name].  Stated pt. does not need any wound care; stated she has a closed incision following the 5th toe amputation, and no wound care is indicated.  [name] stated if pt. Feels she needs physical therapy or a nurse to check on the surgical site, that can be ordered.  Phone call back to pt.  Gave pt. above recommendations by [name].  Pt. Questioned about frequency of changing the dressing to her right foot.  Encouraged to keep area clean/ dry and to change the dressing daily, to monitor the site.  Pt. stated she would be having [facility] coming in her home at 6:00 PM tonight, and questioned if that nurse could change her dressing daily.  Offered to call [facility] to check on coordinating dressing changes.  Pt. stated she has several friends that are nurses, and will enlist their assistance in changing her dressing.

## 2013-06-02 NOTE — Progress Notes (Signed)
[facility]  Patient Status:   Active pt with [facility] up to this readmission for surgery  [facility] is providing the following services: [facility] HH RN and [facility] for home IV ABX.  [facility] Coordinator team will follow to support DC home when deemed appropriate by MD team.  If patient discharges after hours, please call [phone].   Sedalia Muta 06/02/2013, 10:10 AM

## 2013-06-02 NOTE — Progress Notes (Signed)
  Date: 06/02/2013  Patient name: [name]  Medical record number: [id number]  Date of birth: 25-Sep-1946   This patient has been seen and the plan of care was discussed with the house staff. Please see their note for complete details. I concur with their findings with the following additions/corrections:  Feels well today. She is happy with her care. Hickman catheter placed without complications.  BG are better controlled.  Filed Vitals:   06/02/13 0613  BP: 156/58  Pulse: 63  Temp: 97.6 F (36.4 C)  Resp: 18   GEN: AAOx3, NAD CV: S1S2, no m/r/g, RRR LE: amputation site on foot c/d/i.  A/P -67 yr old WF w/ Type I IDDM poorly controlled, vasculopathy, s/p L forefoot amputation in 2011, with osteomyelitis, POD #6 amputation of right 5th toe.  She has a Non-AG met acidosis. Repeat BMP. May be due to Linezolid, if so will need to monitor. Renal function continues to improve slowly, suspect recent ATN.  She will have follow up arranged. May be D/C today unless repeat BMP is worsened, then will need to work up further inpatient.   [name], DO 06/02/2013, 1:10 PM

## 2013-06-02 NOTE — Progress Notes (Signed)
Physical Therapy Treatment Patient Details Name: [name] MRN: [id number] DOB: 09-20-1946 Today's Date: 06/02/2013 Time: 1205-1227 PT Time Calculation (min): 22 min  PT Assessment / Plan / Recommendation  History of Present Illness  67 year old female patient of Dr. Arbie [name] who is  status post left femoral to anterior tibial artery bypass with translocated non-reversed vein in July 2010.  She also underwent transmetatarsal amputation which healed.  She was admitted to the hospital on 05/23/2013 with a worsening right foot wound that has been present for 3 months.  She thinks that it was the result of poorly fitting shoes.  Approximately one week ago, it became worse and began draining foul smelling fluid.  It became red and painful.  She reports subjective fevers.  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  ABI's in 12/2012 were 1.2 on the left and 0.82 on the right with monophasic waveforms bilaterally.  Pt is s/p R 5th toe amputation.   PT Comments   Pt declining ambulation or stair training at this time because she wants to conserve her energy to d/c home today. Reviewed safe method for ascending/descending stairs. Patient able to teach back safe sideways method with use of rail. She reports she will have help to get into her house. Education provided on safe don/doff of darko shoe (reports she does have difficulty reaching her foot). Education provided on energy conservation and benefits of HHPT (as pt initially declining this). Especially with her CHF I recommend she have HHPT to help progress her activity tolerance and return back to baseline. Patient provided with CHF and breathing exercise sheet for home use. Declined practicing these activities however I was able to explain safe progression of exercise. Patient very self aware and able to teach back information on pursed lip breathing.   Follow Up Recommendations  Home health PT      Does the patient have the potential to tolerate intense rehabilitation     Barriers to Discharge        Equipment Recommendations       Recommendations for Other Services    Frequency     Progress towards PT Goals Progress towards PT goals: Not progressing toward goals - comment (pt to d/c home today and wants to conserve energy)  Plan Current plan remains appropriate    Precautions / Restrictions Precautions Precautions: Fall Other Brace/Splint: Darco shoe Restrictions Weight Bearing Restrictions: No Other Position/Activity Restrictions: WBAT with Darco shoe   Pertinent Vitals/Pain Denies pain    Mobility  Bed Mobility Bed Mobility: Not assessed Transfers Transfers: Not assessed    Exercises  Explained CHF exercise sheet    PT Goals (current goals can now be found in the care plan section)    Visit Information  Last PT Received On: 06/02/13 Assistance Needed: +1 History of Present Illness:  67 year old female patient of Dr. Arbie [name] who is  status post left femoral to anterior tibial artery bypass with translocated non-reversed vein in July 2010.  She also underwent transmetatarsal amputation which healed.  She was admitted to the hospital on 05/23/2013 with a worsening right foot wound that has been present for 3 months.  She thinks that it was the result of poorly fitting shoes.  Approximately one week ago, it became worse and began draining foul smelling fluid.  It became red and painful.  She reports subjective fevers.  She had an angiogram in 2010 that revealed an occluded right SFA with  moderate tibial disease.  ABI's in 12/2012 were 1.2 on the left and 0.82 on the right with monophasic waveforms bilaterally.  Pt is s/p R 5th toe amputation.    Subjective Data      Cognition  Cognition Arousal/Alertness: Awake/alert Behavior During Therapy: WFL for tasks assessed/performed Overall Cognitive Status: Within Functional Limits for tasks assessed    Balance   Balance Balance Assessed: Yes Static Sitting Balance Static Sitting - Balance Support: No upper extremity supported Static Sitting - Level of Assistance: 7: Independent  End of Session PT - End of Session Activity Tolerance: Patient limited by fatigue Patient left: in bed   GP     [name] 06/02/2013, 2:25 PM

## 2013-06-02 NOTE — Progress Notes (Signed)
ANTIBIOTIC CONSULT NOTE - FOLLOW UP  Pharmacy Consult:  Merrem + Zyvox Indication:  Osteomyelitis s/p toe amputation  Allergies  Allergen Reactions  . Adhesive [Tape] Other (See Comments)    "old [employer] adhesive tape; takes my skin off; can use paper tape"  . Bactrim [Sulfamethoxazole W-Trimethoprim]     Hyperkalemia and Increased creatinine  . Cephalexin Swelling and Rash  . Ciprofloxacin Swelling and Rash    Patient Measurements: Height: 5\' 3"  (160 cm) Weight: 185 lb 11.2 oz (84.233 kg) IBW/kg (Calculated) : 52.4  Vital Signs: Temp: 97.6 F (36.4 C) (08/19 0613) Temp src: Oral (08/19 0613) BP: 156/58 mmHg (08/19 0613) Pulse Rate: 63 (08/19 0613) Intake/Output from previous day: 08/18 0701 - 08/19 0700 In: 1250 [I.V.:1150; IV Piggyback:100] Out: 1350 [Urine:1350]  Labs:  Recent Labs  05/31/13 0612 05/31/13 0645 06/01/13 0640 06/02/13 0500  WBC  --  8.5 7.9 7.9  HGB  --  11.3* 10.8* 11.0*  PLT  --  233 251 262  LABCREA 104.92  --   --   --   CREATININE  --  2.02* 2.02* 1.81*   Estimated Creatinine Clearance: 31 ml/min (by C-G formula based on Cr of 1.81). No results found for this basename: VANCOTROUGH, Leodis Binet, VANCORANDOM, GENTTROUGH, GENTPEAK, GENTRANDOM, TOBRATROUGH, TOBRAPEAK, TOBRARND, AMIKACINPEAK, AMIKACINTROU, AMIKACIN,  in the last 72 hours   Microbiology: Recent Results (from the past 720 hour(s))  CULTURE, BLOOD (ROUTINE X 2)     Status: None   Collection Time    05/22/13 11:59 PM      Result Value Range Status   Specimen Description BLOOD RIGHT ARM   Final   Special Requests BOTTLES DRAWN AEROBIC ONLY [facility]   Final   Culture  Setup Time     Final   Value: 05/23/2013 11:17     Performed at [facility]   Culture     Final   Value: NO GROWTH 5 DAYS     Performed at [facility]   Report Status 05/29/2013 FINAL   Final  CULTURE, BLOOD (ROUTINE X 2)     Status: None   Collection Time    05/23/13 12:15 AM      Result  Value Range Status   Specimen Description BLOOD LEFT HAND   Final   Special Requests BOTTLES DRAWN AEROBIC ONLY 2CC   Final   Culture  Setup Time     Final   Value: 05/23/2013 11:16     Performed at [facility]   Culture     Final   Value: NO GROWTH 5 DAYS     Performed at [facility]   Report Status 05/29/2013 FINAL   Final  SURGICAL PCR SCREEN     Status: Abnormal   Collection Time    05/23/13  6:10 AM      Result Value Range Status   MRSA, PCR POSITIVE (*) NEGATIVE Final   Staphylococcus aureus POSITIVE (*) NEGATIVE Final   Comment:            The Xpert SA Assay (FDA     approved for NASAL specimens     in patients over 52 years of age),     is one component of     a comprehensive surveillance     program.  Test performance has     been validated by [employer] for patients greater     than or equal to 37 year old.  It is not intended     to diagnose infection nor to     guide or monitor treatment.   Assessment: Pt on Zyvox Day #5 + Merrem Day #6 (abx Day #12) for right foot osteo, s/p 5th toe amputation 8/13. Plan for 4 wks abx.  afebrile, WBC WNL, CK down 94  Vanc 8/8 >> 8/12 Zosyn 8/8 >> 8/9 Cubicin 8/13 >> 8/15 (d/c'ed d/t elevated CK) Merrem 8/14 >> Zyvox 8/15 >>  8/8 Bldx2 - negative 8/9 MRSA PCR - positive   Goal of Therapy:  Clearance of infection  Plan:  - Change Merrem 500mg  IV Q12H - Zyvox 600mg  PO BID - Follow renal fxn closely and adjust antibiotics as appropriate - Monitor vitals, CBC - F/U resume Lipitor/Zetia as CK normalized  Christoper Fabian, PharmD, BCPS Clinical pharmacist, pager [phone] 06/02/2013, 10:14 AM

## 2013-06-02 NOTE — Progress Notes (Signed)
Patient discharged to home with instructions, escorted by family member.

## 2013-06-02 NOTE — Telephone Encounter (Signed)
Received a call around 7:30 pm from patient's pharmacy [phone]), who states that the patient required prior authorization for Linezolid, otherwise was ~$300.  I called the medicaid prior authorization line, who stated the patient did not qualify for prior authorization, since the patient did have Medicare part D.  Will follow-up with inpatient team, as well as ID in the AM, to try to determine if an alternative regimen could be used for treatment of her osteomyelitis.  Awanda Mink 06/02/2013, 8:00 PM

## 2013-06-03 ENCOUNTER — Telehealth: Payer: Self-pay

## 2013-06-03 DIAGNOSIS — A4902 Methicillin resistant Staphylococcus aureus infection, unspecified site: Secondary | ICD-10-CM | POA: Diagnosis not present

## 2013-06-03 DIAGNOSIS — Z452 Encounter for adjustment and management of vascular access device: Secondary | ICD-10-CM | POA: Diagnosis not present

## 2013-06-03 DIAGNOSIS — I96 Gangrene, not elsewhere classified: Secondary | ICD-10-CM | POA: Diagnosis not present

## 2013-06-03 DIAGNOSIS — E1159 Type 2 diabetes mellitus with other circulatory complications: Secondary | ICD-10-CM | POA: Diagnosis not present

## 2013-06-03 DIAGNOSIS — S98139A Complete traumatic amputation of one unspecified lesser toe, initial encounter: Secondary | ICD-10-CM | POA: Diagnosis not present

## 2013-06-03 DIAGNOSIS — E1129 Type 2 diabetes mellitus with other diabetic kidney complication: Secondary | ICD-10-CM | POA: Diagnosis not present

## 2013-06-03 NOTE — Discharge Summary (Signed)
Name: [name] MRN: [id number] DOB: 1946-05-21 67 y.o. PCP: [name], MD  Date of Admission: 05/22/2013  7:12 PM Date of Discharge: 06/02/2013 Attending Physician: Dr. [name] Discharge Diagnosis:  1. 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 S/P Ray amputation right fifth toe on 05/27/13  3. Cellulitis of right foot 4. Acute kidney injury, suspect recent ATN 5. Elevated CK  6. Ventricular arrhythmia vs artifact 7. Peripheral artery disease  8. S/P atherectomy and angioplasty of the right superficial femoral artery on 05/27/13 9. Hyponatremia 10. Diabetes Mellitus, Type 1, uncontrolled  11. Anemia 12. CHF, diastolic  13. CAD, s/p CABG  14. Hypertension   15. HLD  16. CKD stage III 17. Hypothyroid    Discharge Medications:   Medication List    STOP taking these medications       ezetimibe 10 MG tablet  Commonly known as:  ZETIA     HUMALOG PEN 100 UNIT/ML injection  Generic drug:  insulin lispro     ibuprofen 200 MG tablet  Commonly known as:  ADVIL,MOTRIN      TAKE these medications       acetaminophen 325 MG tablet  Commonly known as:  TYLENOL  Take 650 mg by mouth every 6 (six) hours as needed for fever.     aspirin EC 325 MG tablet  Take 325 mg by mouth daily.     atorvastatin 40 MG tablet  Commonly known as:  LIPITOR  Take 40 mg by mouth at bedtime.     brimonidine 0.2 % ophthalmic solution  Commonly known as:  ALPHAGAN  Place 1 drop into both eyes 2 (two) times daily.     cyanocobalamin 1000 MCG/ML injection  Commonly known as:  (VITAMIN B-12)  Inject 1,000 mcg into the muscle every 30 (thirty) days.     docusate sodium 50 MG capsule  Commonly known as:  COLACE  Take by mouth 2 (two) times daily.     furosemide 20 MG tablet  Commonly known as:  LASIX  Take 1 tablet (20 mg total) by mouth 2 (two) times daily.     insulin aspart 100 UNIT/ML injection  Commonly known as:  novoLOG  Inject 0-5 Units into the skin 3 (three)  times daily with meals.     insulin aspart 100 UNIT/ML injection  Commonly known as:  novoLOG  Inject 4 Units into the skin 3 (three) times daily with meals.     insulin glargine 100 UNIT/ML injection  Commonly known as:  LANTUS  Inject 0.04 mL (4 Units total) into the skin 2 (two) times daily.     isosorbide mononitrate 30 MG 24 hr tablet  Commonly known as:  IMDUR  Take 30 mg by mouth at bedtime.     latanoprost 0.005 % ophthalmic solution  Commonly known as:  XALATAN  Place 1 drop into both eyes at bedtime.     levothyroxine 125 MCG tablet  Commonly known as:  SYNTHROID, LEVOTHROID  Take 125 mcg by mouth daily.     linezolid 600 MG tablet  Commonly known as:  ZYVOX  Take 1 tablet (600 mg total) by mouth every 12 (twelve) hours.     metoprolol succinate 25 MG 24 hr tablet  Commonly known as:  TOPROL-XL  Take 25 mg by mouth daily.     Oxycodone HCl 10 MG Tabs  Take 1 tablet (10 mg total) by mouth every 6 (six) hours as needed.  SLOW RELEASE IRON 50 MG Tbcr  Generic drug:  Ferrous Sulfate  Take 50 mg by mouth daily.     sodium chloride 0.9 % SOLN 50 mL with meropenem 500 MG SOLR 500 mg  Inject 500 mg into the vein every 12 (twelve) hours.        Disposition and follow-up:   Ms.Ivee A Reining was discharged from [facility] in Good condition.  At the hospital follow up visit please address:  1.  Please ensure that patient has a f/u appt with [name], [facility] cardiologist, nephrology, infectious disease and her endocrinologist .   2.  Labs / imaging needed at time of follow-up: BMP, Mg, CBC, CK  3. Home health PT, RN and Antibiotics.   4. Follow up on renal function closely and adjust the medication closely. Please contact [facility] Pharmacist.   Follow-up Appointments:     Follow-up Information   Follow up with EARLY, TODD, MD On 06/09/2013. (Your first follow up appointment is at 1pm on 8/26)    Specialty:  Vascular Surgery    Contact information:   [address] [place] [address] [phone]       Follow up with [name], MD On 06/09/2013. (Please follow up with your primary care doctor, Dr Clyde Lundborg, on 8/26 at  2:45pm)    Specialty:  Internal Medicine   Contact information:   [address] [place] [address] [phone]       Follow up with [name], MD On 06/22/2013. (Appt is Sept 8 at 4:15)    Specialty:  Infectious Diseases   Contact information:   [address] [place] [address] [phone]       Follow up with Garnetta Buddy, MD On 06/26/2013. (Please check in at 8:15 am for your appointment Sept 12)    Specialty:  Nephrology   Contact information:   [address] [place] [address] [phone]       Follow up with Dietrich Pates, MD On 07/10/2013. (You appointment is on 07/10/2013 at 1:45 PM )    Specialty:  Cardiology   Contact information:   [address] [place] [address] [phone]       Discharge Instructions: Discharge Orders   Future Appointments Provider Department Dept Phone   06/05/2013 10:30 AM [facility] Only [facility] [phone]   06/08/2013 11:00 AM Victorino December, MD [facility] MED[facility] ONCOL[facility] [phone]   06/08/2013 11:45 AM [facility] Nurse [facility] [phone]   06/09/2013 1:00 PM Larina Earthly, MD [facility] [phone]   06/09/2013 2:45 PM [name], MD [facility] [phone]   06/22/2013 4:15 PM Randall Hiss, MD [facility] [phone]   07/06/2013 10:30 AM [facility] In[facility] Nurse [facility] [phone]   07/08/2013 8:10 AM [facility] [employer] [employer] [facility] Main Office [place]) [phone]   07/10/2013 1:45 PM Pricilla Riffle, MD [facility] Main Office [place]) [phone]    08/03/2013 10:30 AM [facility] In[facility] Nurse [facility] [phone]   08/31/2013 10:30 AM [facility] In[facility] Nurse [facility] [phone]   09/28/2013 10:30 AM [facility] In[facility] Nurse [facility] [phone]   12/22/2013 2:30 PM [facility] [phone]  12/22/2013 3:00 PM [facility] [phone]   12/22/2013 3:40 PM Evern Bio, NP [facility] [phone]   Future Orders Complete By Expires   (HEART FAILURE PATIENTS) Call MD:  Anytime you have any of the following symptoms: 1) 3 pound weight gain in 24 hours or 5 pounds in 1 week 2) shortness of breath, with or without a dry hacking cough 3) swelling in the hands, feet or stomach 4) if you have to sleep on extra pillows at night in order to breathe.  As directed    Call MD for:  difficulty breathing, headache or visual disturbances  As directed    Call MD for:  redness, tenderness, or signs of infection (pain, swelling, redness, odor or green/yellow discharge around incision site)  As directed    Call MD for:  severe uncontrolled pain  As directed    Call MD for:  temperature >100.4  As directed    Diet - low sodium heart healthy  As directed    Increase activity slowly  As directed       Consultations: Treatment Team:  Larina Earthly, MD  Procedures Performed:  US Renal  05/29/2013   *RADIOLOGY REPORT*  Clinical Data:  Renal failure, diabetes, chronic renal disease  RENAL/URINARY TRACT ULTRASOUND COMPLETE  Comparison:  None.  Findings:  Right Kidney:  The right kidney demonstrates mild to moderate increased echogenicity.  It measures 11 cm in length.There is no hydonephrosis.  Left Kidney:  The left kidney is difficult to visualize well.  It is difficult to ascertain its echogenicity but it does appear at least mildly echogenic.  It  measures 10 cm in length.There is no hydronephrosis.  Bladder:  The bladder appears normal.  IMPRESSION: Chronic medical renal disease.   Original Report Authenticated By: Esperanza Heir, M.D.   Mr Foot Right W Wo Contrast  05/23/2013   *RADIOLOGY REPORT*  Clinical Data: Right foot wound, query soft tissue infection and / or osteomyelitis.  Foot erythema and pain.  MRI OF THE RIGHT FOREFOOT WITHOUT AND WITH CONTRAST  Technique:  Multiplanar, multisequence MR imaging was performed both before and after administration of intravenous contrast.  Contrast: 15mL MULTIHANCE GADOBENATE DIMEGLUMINE 529 MG/ML IV SOLN  Comparison: 04/04/2012  Findings: Despite efforts by the patient and technologist, motion artifact is present on some series of today's examination and could not be totally eliminated.  This reduces diagnostic sensitivity and specificity.  Abnormal edema and enhancement compatible with osteomyelitis involving the head and distal metaphysis of the fifth metatarsal and the adjacent proximal phalanx of the fifth toe.  Adjacent 1.8 by 0.9 x 2.1 cm fluid collection may be draining to the skin surface on image 17 of series 12, and extends from the vicinity of the fifth MCP joint.  No additional significant abnormal osseous edema in the forefoot. No overt bony destruction of the articular margins.  There is diffuse subcutaneous edema tracking in the forefoot which could reflect cellulitis.  Low-level edema tracts along the plantar foot musculature.  IMPRESSION:  1.  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 Probable septic fifth MTP joint, with a large adjacent fluid collection probably draining to the scan. 2.  Diffuse subcutaneous edema.  Although diffuse cellulitis in the foot is not excluded, the associated subcutaneous infiltrative enhancement is most prominent in the vicinity of the osteomyelitis and draining fluid collection.  Original  Report Authenticated By: Gaylyn Rong, M.D.   Ir Fluoro Guide Cv Line Right  06/01/2013   *RADIOLOGY REPORT*  Clinical Data/Indication: MRSA  IR RIGHT FLOURO GUIDE CV LINE,IR ULTRASOUND GUIDANCE VASC ACCESS RIGHT  Sedation: Versed 2.5 mg, Fentanyl 100 mg.  Total Moderate Sedation Time: 19 minutes.  Fluoroscopy Time: 1 minute and 0 seconds.  Procedure: The procedure, risks, benefits, and alternatives were explained to the patient. Questions regarding the procedure were encouraged and answered. The patient understands and consents to the procedure.  The right neck was prepped with betadine in a sterile fashion, and a sterile drape was applied covering the operative field. A sterile gown and sterile gloves were used for the procedure.  Under sonographic guidance, a micropuncture needle was inserted into the right internal jugular vein and removed over a 0018 wire which was upsized to a [employer].  A second incision was made in the upper chest.  A tunneling device was utilized to advance the leading edge of the catheter from the second incision help the puncture site incision.  A peel-away sheath was inserted.  The heparin was cut to the measure length then advanced through the peel-away sheath during expiration.  Peel-away sheath was removed. The catheter was flushed.  The chest incision was closed with an O Prolene pursestring knot.  The neck incision was closed with a 4-0 Vicryl subcuticular stitch.  Dermabond was applied.  Findings: The tip of the Hickman catheter is at the cavoatrial junction.  Complications: None.  IMPRESSION: Successful right internal jugular vein Hickman catheter placement. Tip is at the cavoatrial junction.   Original Report Authenticated By: Jolaine Click, M.D.   Ir US Guide Vasc Access Right  06/01/2013   *RADIOLOGY REPORT*  Clinical Data/Indication: MRSA  IR RIGHT FLOURO GUIDE CV LINE,IR ULTRASOUND GUIDANCE VASC ACCESS RIGHT  Sedation: Versed 2.5 mg, Fentanyl 100 mg.  Total Moderate  Sedation Time: 19 minutes.  Fluoroscopy Time: 1 minute and 0 seconds.  Procedure: The procedure, risks, benefits, and alternatives were explained to the patient. Questions regarding the procedure were encouraged and answered. The patient understands and consents to the procedure.  The right neck was prepped with betadine in a sterile fashion, and a sterile drape was applied covering the operative field. A sterile gown and sterile gloves were used for the procedure.  Under sonographic guidance, a micropuncture needle was inserted into the right internal jugular vein and removed over a 0018 wire which was upsized to a [employer].  A second incision was made in the upper chest.  A tunneling device was utilized to advance the leading edge of the catheter from the second incision help the puncture site incision.  A peel-away sheath was inserted.  The heparin was cut to the measure length then advanced through the peel-away sheath during expiration.  Peel-away sheath was removed. The catheter was flushed.  The chest incision was closed with an O Prolene pursestring knot.  The neck incision was closed with a 4-0 Vicryl subcuticular stitch.  Dermabond was applied.  Findings: The tip of the Hickman catheter is at the cavoatrial junction.  Complications: None.  IMPRESSION: Successful right internal jugular vein Hickman catheter placement. Tip is at the cavoatrial junction.   Original Report Authenticated By: Jolaine Click, M.D.   Dg Chest Port 1 View  05/23/2013   *RADIOLOGY REPORT*  Clinical Data: Cough, shortness of breath.  PORTABLE CHEST - 1 VIEW  Comparison: 11/28/2012  Findings: Previous CABG.  Heart size upper limits normal.  Lungs clear.  No effusion.  IMPRESSION:  No acute disease post CABG.   Original Report Authenticated By: D. Andria Rhein, MD    2D Echo: 05/29/13  - Left ventricle: Akinesis apex, anterior wall, entire septum. There is a septal "wiggle." EF in the 30% range. The cavity size was normal. Wall  thickness was increased in a pattern of mild LVH. Findings consistent with left ventricular diastolic dysfunction. - Aortic valve: Significant calcification of leaflets. Opening is seen. With decreased EF, we could be underestimating severity of AS. However, I suspect moderate AS. Peak velocity: 260cm/s (S). Mean gradient: 15mm Hg (S). Peak gradient: 27mm Hg (S). - Left atrium: The atrium was mildly dilated. - Right ventricle: The cavity size was mildly dilated. Systolic function was moderately reduced. - Right atrium: The atrium was mildly dilated. - Impressions: I reviewed the report and the images from 10/2011 study. I believe overall there is no significant change.   Renal ultrasound 05/29/13 Chronic medical renal disease   Admission HPI:  Ms Stennis is a 67 yo with poorly controlled Type I DM, CAD s/p CABG, PVD s/p L fem to anterior tibial artery bypass, L forefoot amputation in 2011, and CKD who presents from the [facility] with right foot wound.  She was seen in clinic today (by Dr. Rogelia Boga) for an acute visit for purulent wound on her lateral R foot. She states it started 3 months ago as a pinpoint lesion that occasionally drained clear liquid. She thinks it may have started secondary to ill fitting shoes. She had it looked at by her cardiologist about 1 month ago, who reportedly told her it was OK. One week ago, however, the pinpoint hole increased in size, the drainage became purulent, a foul odor developed, and the area became red and painful. She has had subjective fever and chills ever since. She also has sensitive, reddened skin over her right shin, which according to her is chronic 2/2 venous insufficiency, however she thinks it may be a little more red than normal today.  She has been managing the wound with Epson salt baths and gauze bandages soaked in neosporin. She has been taking ibuprofen for pain even though she know she should not because of her CKD.   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 Her [facility] Dr Arbie Cookey was consulted and performed the ray amputation right fifth toe and atherectomy/angioplasty of the right superficial femoral artery on 05/27/13. Patient was treated with broad spectrum antibiotics during this hospitalization. Blood cultures were negative. The patient received vancomycin and zosyn on admission. Vancomycin was stopped and switched to Daptomycin on hospital day # 4 due to worsening of kidney function. However, patient's CK level was elevated due to Daptomycin, which was stopped and switched to  Linezolid. And Zosyn was switched to meropenem per ID recommendation. The patient had a hickman catheter placed 8/18 for long term antibiotic treatment.   She is discharged home with Merrem 500  IV Q12 hours and Zyvox 600 mg po BID. Her IV antibiotic will be administered through the Hickman catheter. [facility] will follow up as outpatient. She will need close follow up on her BMP and renal function for antibiotic dosage adjustment. Please contact [facility] pharmacist for further follow up. Please repeat her CBC, BMP, Mg during her clinic visit.   Of note,  Echo was performed while in patient as part of surgical risk assessment. The report mostly unchanged from past echo Jan 2013.  # Diabetes Mellitus, Type 1, uncontrolled     History of uncontrolled DM.  She reports that she has had hypoglycemia symptoms whenever her CBG is < 120. She would  like her goal of FSBS to be 130-150. Given her GFR and weight, her calculated basal requirement is 8-12 daily, moving toward 8 as her infection resolved. We had good success with her sugar control, fasting ~80 and most daily sugars in 100-150 range with Lantus 4 BID and Novolog prandial coverage 4 TID with full meals, 2 units with half meals and a customized sliding scale. Sensitivity factor is 60 now. Please follow up as outpatient for medical compliance.  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 Please encourage her to follow up as well.     # Acute kidney injury, suspect recent ATN in the setting of CKD stage III a baseline creatine around 1.36.    She has AKI from vancomycin and a component of ATN due to CIN as well. Her renal function reached plateau of 2.02. Nephrology was consulted. We stopped all Nephrotoxic medications. We continue home mdur, metoprolol but held lasix due to AKI for most of the hospital stay. Was restarted on day of d/c with 20mg  BID. Her Cr started to trend down and was 1.85 upon discharge. She will need a close follow up with her BMP and Mg as an outpatient. And her antibiotics treatment will need to be adjusted based on renal function changes.    # Ventricular arrhythmia vs artifact    Patient's heart monitor showed questionable ventricular arrhythmia versus Torsade de pointes versus artifact on 05/31/13. The patient was asymptomatic during the event and follow up EKG (12 lead) immediately after was unremarkable except for T-wave abnormality in lead 1, aVL, V4, V5, V6, which is consistent with her old EKG in the past. Her QTc was ~ 450. Her [place] was 135, K was 4.8, and Mg was 2.2.  Patient has had an extensive cardiac history involving CAD, status post CABG with a cardiac cath in December 2012 showing patent LIMA to LAD with occlusion of the distal LAD, high-grade stenosis of a nondominant RCA, significant disease or proximal left circumflex, SVG to L PDA occluded, SVG to OM  patent as well as moderate aortic stenosis.  It was determined that the patient would be a very poor candidate for surgical revascularization and medical treatment was continued. An echocardiogram in 2013 showed EF of 30% with a grade 2 diastolic dysfunction and moderate aortic stenosis. Her repeat echocardiogram this admission shows the same. Her cardiologist is [facility] [name].  We cubsided [facility] cardiology on-call Dr. Daleen Squibb. The patient's cardiology office notes, echo reports, and cardiac cath reports, current meds and hospital course were reviewed and the case was discussed with Dr. Daleen Squibb, who indicated that patient is certainly at risk for ventricular arrhythmia given her extensive cardiac history and low EF. However, the rhythm strips are not very suggestive of ventricular arrhythmia or Torsade de pointes.  It could be artifacts. We discontinued Reglan as it could cause many cardiac vascular adverse effects including QT prolongation and Torsade (patient never received it).   Patient will need follow up with her [facility] cardiologist as outpatient  # Hyponatremia, corrected    # Elevated CK   Patient was noted to have elevated CK level of 384 after she was started on Daptomycin. She denied mucular pain or weakness. Her Zetia, Lipitor and Daptomycin were stopped. And her CK trended down and normalized at a level of 94. Her Lipitor was resumed upon discharge. Her Zetia was D/C'd. Please follow up on her CK level at the clinic follow up visit.    # chronic CHF, CAD, s/p CABG,  stable  We monitored the fluid status closely given her CHF history, EF 30-35%, grade 2 diastolic dysfunction), moderate to severe AS.   # Anemia, stable. Follow as outpatient  # Hypertension, stable   # HLD, stable    We were holding home Lipitor, ezetimibe after an elevation in CK seen with the start of Daptomycin. Her Lipitor was resumed upon discharge. And her Zetia was D/C'd.   # Hypothyroid, stable  Discharge Vitals:   BP  167/76  Pulse 63  Temp(Src) 97.8 F (36.6 C) (Oral)  Resp 18  Ht 5\' 3"  (1.6 m)  Wt 185 lb 11.2 oz (84.233 kg)  BMI 32.9 kg/m2  SpO2 98%  Discharge Labs:  No results found for this or any previous visit (from the past 24 hour(s)).  Signed: Dede Query, MD 06/03/2013, 10:04 PM   Time Spent on Discharge: > 30 minutes Services Ordered on Discharge: home health PT, RN ( IV antibiotics and wound care), home infusion pharmacy.  Equipment Ordered on Discharge: HH IV pump.

## 2013-06-03 NOTE — Telephone Encounter (Signed)
HH RN called to report condition of right foot with daily dressing change today.  Stated "(R) 5th toe amp. incision site looks fine."  Reports @ approx. 1 cm. below incision, there is an "area of maceration that end to end is 1-1.5 cm wide"; then reports that "within the area of maceration there are 2 new sores measuring approx. 0.2-0.3 cm in depth."  Reports moderate amt. of sero-sanguinous drainage.  States pt. is afebrile.  HH RN placed a nonadherent telfa dressing , dry gauze over surgical site, and wrapped w/ace bandage.  Will report to [name] in AM, and call [facility] with any further orders.  Agrees w/ plan.

## 2013-06-03 NOTE — Care Management Note (Signed)
Followed up with the patient concerning the cost of  Zyvox.  An authorization form has been sent to her insurance prescription plan, [employer], which should be processed by 06/04/13.  An application has been initiated with the Zyvox assistance program and cannot be completed until after the patients insurance authorization is complete.  An emergency release of a 7 day supply was approved by insurance but would cost $944.32 out-of-pocket.  The pharmacy approved for 3 tablets to be dispensed from the main pharmacy to allow time for the authorization process.  The patients cousin, Fontaine No, picked up the medication today @ 1545 to deliver to the patient.  I will follow-up with the insurance company and assistance program tomorrow.   Lourdes Sledge RN Case Manager [phone]

## 2013-06-04 ENCOUNTER — Telehealth: Payer: Self-pay | Admitting: *Deleted

## 2013-06-04 DIAGNOSIS — I96 Gangrene, not elsewhere classified: Secondary | ICD-10-CM | POA: Diagnosis not present

## 2013-06-04 DIAGNOSIS — A4902 Methicillin resistant Staphylococcus aureus infection, unspecified site: Secondary | ICD-10-CM | POA: Diagnosis not present

## 2013-06-04 DIAGNOSIS — E1159 Type 2 diabetes mellitus with other circulatory complications: Secondary | ICD-10-CM | POA: Diagnosis not present

## 2013-06-04 DIAGNOSIS — E1129 Type 2 diabetes mellitus with other diabetic kidney complication: Secondary | ICD-10-CM | POA: Diagnosis not present

## 2013-06-04 DIAGNOSIS — Z452 Encounter for adjustment and management of vascular access device: Secondary | ICD-10-CM | POA: Diagnosis not present

## 2013-06-04 DIAGNOSIS — S98139A Complete traumatic amputation of one unspecified lesser toe, initial encounter: Secondary | ICD-10-CM | POA: Diagnosis not present

## 2013-06-04 NOTE — Telephone Encounter (Signed)
Discussed change in right foot w/ [name].  Recommends to wash area with soap and water, and cover with dry gauze dressing, and continue to keep clean/ dry.   Advised to have pt. Come to office on 06/09/13 for follow-up.  Notified Gaylyn Rong, [facility]. RN for [facility] of new orders for wound care right foot.  Verb. Understanding.   Advised to tell pt. she will receive phone call from office with appt. on 8/26.  Agrees.

## 2013-06-04 NOTE — Care Management Note (Signed)
Patients prescription plan denied Zyvox.  Completed application process with Zyvox assistance program and the patient was approved for the medication with a zero co-pay.  I called the pharmacy with this information and the prescription was processed successfully.  The patient has been updated and will pick up the medication at her pharmacy.   Lourdes Sledge RN Case Manager [phone]

## 2013-06-04 NOTE — Telephone Encounter (Signed)
BAR made appt for pt 8/26 @ 1 on 8/13, 06/04/13 mar

## 2013-06-04 NOTE — Telephone Encounter (Signed)
Patient left a message with schedulers.  Would like to know what to do about tomorrow's lab appointment and MD f/u on Monday, 06-08-2013.  Message left on patient's home and mobile numbers requesting a return call.  Awaiting to learn if ambulatory and any current signs/symptoms of anemia.  Patient is s/p toe amputation on 05-27-2013.  Noted hospital f/u appointments on 06-09-2013 and 06-22-2013.  Last Vit B12 injection was received on 04-14-2013.  Will notify providers of patient's concern.

## 2013-06-05 ENCOUNTER — Telehealth: Payer: Self-pay | Admitting: Dietician

## 2013-06-05 ENCOUNTER — Other Ambulatory Visit: Payer: Medicare Other | Admitting: Lab

## 2013-06-05 DIAGNOSIS — E1159 Type 2 diabetes mellitus with other circulatory complications: Secondary | ICD-10-CM | POA: Diagnosis not present

## 2013-06-05 DIAGNOSIS — I96 Gangrene, not elsewhere classified: Secondary | ICD-10-CM | POA: Diagnosis not present

## 2013-06-05 DIAGNOSIS — S98139A Complete traumatic amputation of one unspecified lesser toe, initial encounter: Secondary | ICD-10-CM | POA: Diagnosis not present

## 2013-06-05 DIAGNOSIS — A4902 Methicillin resistant Staphylococcus aureus infection, unspecified site: Secondary | ICD-10-CM | POA: Diagnosis not present

## 2013-06-05 DIAGNOSIS — Z452 Encounter for adjustment and management of vascular access device: Secondary | ICD-10-CM | POA: Diagnosis not present

## 2013-06-05 DIAGNOSIS — E1129 Type 2 diabetes mellitus with other diabetic kidney complication: Secondary | ICD-10-CM | POA: Diagnosis not present

## 2013-06-05 NOTE — Telephone Encounter (Signed)
Reached Ms. Lauricella this morning who says she has so much going on.  "I can't do anything right now".  Home health nurses are with her; says she can't drive and is home bound.  Walks with a walker around the home.  Clarified with this nurse that she did receive Vitamin B12 injection ob 05-22-2013 at her Foot Doctor's office before admitted to the Hospital.   Informed her [facility] Has authorized rescheduling her upcoming appointments.  Ms. Winkles would like to f/u with surgeon and hospital f/u to determine if more surgery is needed.  Reports she will call to reschedule.

## 2013-06-06 DIAGNOSIS — S98139A Complete traumatic amputation of one unspecified lesser toe, initial encounter: Secondary | ICD-10-CM | POA: Diagnosis not present

## 2013-06-06 DIAGNOSIS — E1159 Type 2 diabetes mellitus with other circulatory complications: Secondary | ICD-10-CM | POA: Diagnosis not present

## 2013-06-06 DIAGNOSIS — A4902 Methicillin resistant Staphylococcus aureus infection, unspecified site: Secondary | ICD-10-CM | POA: Diagnosis not present

## 2013-06-06 DIAGNOSIS — I96 Gangrene, not elsewhere classified: Secondary | ICD-10-CM | POA: Diagnosis not present

## 2013-06-06 DIAGNOSIS — Z452 Encounter for adjustment and management of vascular access device: Secondary | ICD-10-CM | POA: Diagnosis not present

## 2013-06-06 DIAGNOSIS — E1129 Type 2 diabetes mellitus with other diabetic kidney complication: Secondary | ICD-10-CM | POA: Diagnosis not present

## 2013-06-07 DIAGNOSIS — S98139A Complete traumatic amputation of one unspecified lesser toe, initial encounter: Secondary | ICD-10-CM | POA: Diagnosis not present

## 2013-06-07 DIAGNOSIS — A4902 Methicillin resistant Staphylococcus aureus infection, unspecified site: Secondary | ICD-10-CM | POA: Diagnosis not present

## 2013-06-07 DIAGNOSIS — E1129 Type 2 diabetes mellitus with other diabetic kidney complication: Secondary | ICD-10-CM | POA: Diagnosis not present

## 2013-06-07 DIAGNOSIS — I96 Gangrene, not elsewhere classified: Secondary | ICD-10-CM | POA: Diagnosis not present

## 2013-06-07 DIAGNOSIS — E1159 Type 2 diabetes mellitus with other circulatory complications: Secondary | ICD-10-CM | POA: Diagnosis not present

## 2013-06-07 DIAGNOSIS — Z452 Encounter for adjustment and management of vascular access device: Secondary | ICD-10-CM | POA: Diagnosis not present

## 2013-06-08 ENCOUNTER — Encounter: Payer: Self-pay | Admitting: Vascular Surgery

## 2013-06-08 ENCOUNTER — Ambulatory Visit: Payer: Medicare Other | Admitting: Oncology

## 2013-06-08 ENCOUNTER — Ambulatory Visit: Payer: Medicare Other

## 2013-06-08 DIAGNOSIS — I96 Gangrene, not elsewhere classified: Secondary | ICD-10-CM | POA: Diagnosis not present

## 2013-06-08 DIAGNOSIS — Z452 Encounter for adjustment and management of vascular access device: Secondary | ICD-10-CM | POA: Diagnosis not present

## 2013-06-08 DIAGNOSIS — E1159 Type 2 diabetes mellitus with other circulatory complications: Secondary | ICD-10-CM | POA: Diagnosis not present

## 2013-06-08 DIAGNOSIS — E1129 Type 2 diabetes mellitus with other diabetic kidney complication: Secondary | ICD-10-CM | POA: Diagnosis not present

## 2013-06-08 DIAGNOSIS — B953 Streptococcus pneumoniae as the cause of diseases classified elsewhere: Secondary | ICD-10-CM | POA: Diagnosis not present

## 2013-06-08 DIAGNOSIS — A4902 Methicillin resistant Staphylococcus aureus infection, unspecified site: Secondary | ICD-10-CM | POA: Diagnosis not present

## 2013-06-08 DIAGNOSIS — S98139A Complete traumatic amputation of one unspecified lesser toe, initial encounter: Secondary | ICD-10-CM | POA: Diagnosis not present

## 2013-06-08 NOTE — Discharge Summary (Signed)
  Date: 06/08/2013  Patient name: [name]  Medical record number: [id number]  Date of birth: [birth year]   This patient has been discussed with the house staff. Please see their note for complete details. I concur with their findings and plan.  [name], DO, FACP Faculty [facility] 06/08/2013, 1:35 PM

## 2013-06-09 ENCOUNTER — Encounter: Payer: Self-pay | Admitting: Vascular Surgery

## 2013-06-09 ENCOUNTER — Ambulatory Visit (INDEPENDENT_AMBULATORY_CARE_PROVIDER_SITE_OTHER): Payer: Medicare Other | Admitting: Vascular Surgery

## 2013-06-09 ENCOUNTER — Ambulatory Visit (INDEPENDENT_AMBULATORY_CARE_PROVIDER_SITE_OTHER): Payer: Medicare Other | Admitting: Internal Medicine

## 2013-06-09 ENCOUNTER — Encounter: Payer: Self-pay | Admitting: Internal Medicine

## 2013-06-09 VITALS — BP 110/60 | HR 70 | Temp 97.0°F | Ht 63.5 in | Wt 181.0 lb

## 2013-06-09 VITALS — BP 158/54 | HR 68 | Resp 20 | Ht 63.5 in | Wt 180.5 lb

## 2013-06-09 DIAGNOSIS — N183 Chronic kidney disease, stage 3 (moderate): Secondary | ICD-10-CM

## 2013-06-09 DIAGNOSIS — E1169 Type 2 diabetes mellitus with other specified complication: Secondary | ICD-10-CM

## 2013-06-09 DIAGNOSIS — E1065 Type 1 diabetes mellitus with hyperglycemia: Secondary | ICD-10-CM

## 2013-06-09 DIAGNOSIS — I509 Heart failure, unspecified: Secondary | ICD-10-CM

## 2013-06-09 DIAGNOSIS — M9080 Osteopathy in diseases classified elsewhere, unspecified site: Secondary | ICD-10-CM

## 2013-06-09 DIAGNOSIS — Z7189 Other specified counseling: Secondary | ICD-10-CM | POA: Insufficient documentation

## 2013-06-09 DIAGNOSIS — L0291 Cutaneous abscess, unspecified: Secondary | ICD-10-CM

## 2013-06-09 DIAGNOSIS — N058 Unspecified nephritic syndrome with other morphologic changes: Secondary | ICD-10-CM | POA: Diagnosis not present

## 2013-06-09 DIAGNOSIS — I7025 Atherosclerosis of native arteries of other extremities with ulceration: Secondary | ICD-10-CM | POA: Insufficient documentation

## 2013-06-09 DIAGNOSIS — E1029 Type 1 diabetes mellitus with other diabetic kidney complication: Secondary | ICD-10-CM

## 2013-06-09 DIAGNOSIS — L0390 Cellulitis, unspecified: Secondary | ICD-10-CM

## 2013-06-09 DIAGNOSIS — I10 Essential (primary) hypertension: Secondary | ICD-10-CM

## 2013-06-09 DIAGNOSIS — M869 Osteomyelitis, unspecified: Secondary | ICD-10-CM

## 2013-06-09 DIAGNOSIS — Z48812 Encounter for surgical aftercare following surgery on the circulatory system: Secondary | ICD-10-CM

## 2013-06-09 DIAGNOSIS — I739 Peripheral vascular disease, unspecified: Secondary | ICD-10-CM

## 2013-06-09 LAB — CBC WITH DIFFERENTIAL/PLATELET
Eosinophils Relative: 4 % (ref 0–5)
Lymphocytes Relative: 11 % — ABNORMAL LOW (ref 12–46)
Lymphs Abs: 0.7 10*3/uL (ref 0.7–4.0)
MCV: 92.1 fL (ref 78.0–100.0)
Neutro Abs: 5.1 10*3/uL (ref 1.7–7.7)
Neutrophils Relative %: 78 % — ABNORMAL HIGH (ref 43–77)
Platelets: 129 10*3/uL — ABNORMAL LOW (ref 150–400)
RBC: 3.4 MIL/uL — ABNORMAL LOW (ref 3.87–5.11)
WBC: 6.4 10*3/uL (ref 4.0–10.5)

## 2013-06-09 NOTE — Progress Notes (Signed)
Patient ID: [name], female   DOB: [birth year], 67 y.o.   MRN: [id number] Subjective:   Patient ID: [name] female   DOB: 23-Jan-1946 67 y.o.   MRN: [id number]  CC:  Hospital followup visit.  HPI:  [name] is a 67 y.o.  lady with past medical history as outlined below, who presents for a hospital followup visit today.  1. Osteomyelitis of right foot: Patient was recently hospitalized from a 8/8 to 8/19 mainly due to left foot osteomyelitis. She is s/p of Ray amputation of right fifth toe on 8/13. Patient is currently taking oral antibiotics, Linezolid and getting IV antibiotics, Meropenum. She is supposed to complete 4 weeks of antibiotics treatment. Generally patient feels okay. She just had a followup visit to orthopedic surgeon, Dr. Bosie [name] office in the morning. She was told that her amputation wound is healing welling as expected. She does not have fever or chills. She has a mild nausea when she takes her oral medications. She has no vomiting, diarrhea or abdominal pain.   2. DM-II: recent A1c 9.8. She is taking Lantus 4 U in AM and 4 U in evening. She is also doing SSI at home. Her CBG was 176 in this AM. She occasionally skip a dose of lantus if her CBG is too low before dinner. She did not have symptoms of hypo-or hyperglycemia.   3. CKD-III: baseline Cre 1.3 to 2.0.  Her Cre was 1.85 at discharge on 06/02/13. She has an appointment with Dr. Hyman [name] on 06/26/13.   4. CAD: s/p CABG on 2012. It is stable. She does not have chest pain.   5. CHF:  2D echo on 05/29/13 showed EF 30 % with diastolic dysfunction, which did not change significantly compared with a 2-D echo on 1/13. During hospitalization, patient had questionable ventricular arrhythmia on heart monitor. Cardiology was cubsided. Per Dr. Daleen [name] patient is at a risk for ventricular arrhythmia patient. Today patient feels okay. She does not have palpitation, chest pain and shortness of breath.    Past Medical  History  Diagnosis Date  . HTN (hypertension)   . Hypothyroidism   . History of recurrent UTIs   . CAD (coronary artery disease)     s/p CABG x3 in 1993; last cath in 2010  . Scleroderma   . Bilateral carpal tunnel syndrome 1970s  . 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 Followed by Dr. Arbie [name]  . Claudication   . Aortic stenosis     0.8cm2 on echo in 10/2011  . Hyperlipidemia     on Lipitor  . Cat bite 2009    with MRSA; required I & D  . Heart murmur   . Blood transfusion     no reaction from transfusion; "when I had heart surgery" (08/28/2012)  . GERD (gastroesophageal reflux disease)   . CHF (congestive heart failure) Aug. 2012    Echo 10/2011: Mild LVH, EF 30%, apex akinetic, septal HK, grade 2 diastolic dysfunction, or functionally bicuspid aortic valve, mild aortic stenosis by gradient, severe by calculated AVA-suspect A. Aortic stenosis probably moderate, trivial MR, mild LAE, mild RAE.   [name] Cellulitis   . Anemia   . B12 deficiency anemia     B 12 injection "monthly since 05/2012" (08/28/2012)  . Iron deficiency anemia 06/13/2012    "iron infusion" (08/28/2012  . Anginal pain     "history; not currently" (  08/28/2012)  . Myocardial infarction 1986    "silent"  . History of bronchitis     "I've had it twice in my life" (08/28/2012)  . Exertional dyspnea     "because of the heart failure" (08/28/2012)  . Type 1 diabetes 10/1949  . Stomach ulcer 1981?    "on Tagament for ~ 1 yr" (08/28/2012)  . Seizure     ? seizure like event in 2010; workup included stress testing which led to repeat cath.   . Arthritis     "hands, hips, all over" (08/28/2012  . Osteoarthritis of both feet   . Breast cancer     left  . Cellulitis June 2013, Nov. 2013 and Feb. 14, 2014    Left leg   Current Outpatient Prescriptions  Medication Sig Dispense Refill  . acetaminophen (TYLENOL) 325 MG tablet Take 650 mg by mouth every 6  (six) hours as needed for fever.      [name] aspirin EC 325 MG EC tablet Take 325 mg by mouth daily.       [name] atorvastatin (LIPITOR) 40 MG tablet Take 40 mg by mouth at bedtime.      . cyanocobalamin (,VITAMIN B-12,) 1000 MCG/ML injection Inject 1,000 mcg into the muscle every 30 (thirty) days.      [name] docusate sodium (COLACE) 50 MG capsule Take by mouth 2 (two) times daily.      . Ferrous Sulfate (SLOW RELEASE IRON) 50 MG TBCR Take 50 mg by mouth daily.       . furosemide (LASIX) 20 MG tablet Take 1 tablet (20 mg total) by mouth 2 (two) times daily.  60 tablet  0  . insulin glargine (LANTUS) 100 UNIT/ML injection Inject 0.04 mL (4 Units total) into the skin 2 (two) times daily.  10 mL  12  . Insulin Lispro, Human, (HUMALOG [place]) Inject into the skin. Sliding scale      . levothyroxine (SYNTHROID, LEVOTHROID) 125 MCG tablet Take 125 mcg by mouth daily.       [name] linezolid (ZYVOX) 600 MG tablet Take 1 tablet (600 mg total) by mouth every 12 (twelve) hours.  56 tablet  0  . metoprolol succinate (TOPROL-XL) 25 MG 24 hr tablet Take 25 mg by mouth daily.      . sodium chloride 0.9 % SOLN 50 mL with meropenem 500 MG SOLR 500 mg Inject 500 mg into the vein every 12 (twelve) hours.  56 each  0  . insulin aspart (NOVOLOG) 100 UNIT/ML injection Inject 0-5 Units into the skin 3 (three) times daily with meals.  1 vial  12  . insulin aspart (NOVOLOG) 100 UNIT/ML injection Inject 4 Units into the skin 3 (three) times daily with meals.  1 vial  12  . isosorbide mononitrate (IMDUR) 30 MG 24 hr tablet Take 30 mg by mouth at bedtime.       [name] latanoprost (XALATAN) 0.005 % ophthalmic solution Place 1 drop into both eyes at bedtime.         No current facility-administered medications for this visit.   Family History  Problem Relation Age of Onset  . Diabetes Mother    History   Social History  . Marital Status: Single    Spouse Name: N/A    Number of Children: N/A  . Years of Education: N/A   Occupational History   . Environmental health practitioner    Social History Main Topics  . Smoking status: Never Smoker   . Smokeless tobacco:  Never Used  . Alcohol Use: No  . Drug Use: No  . Sexual Activity: Not Currently    Birth Control/ Protection: Post-menopausal   Other Topics Concern  . None   Social History Narrative  . None    Review of Systems: Full 14-point review of systems otherwise negative. See HPI.   Objective:  Physical Exam: Filed Vitals:   06/09/13 1500  BP: 110/60  Pulse: 70  Temp: 97 F (36.1 C)  TempSrc: Oral  Height: 5' 3.5" (1.613 m)  Weight: 181 lb (82.101 kg)  SpO2: 96%    Head: Normocephalic and atraumatic.  Eyes: Conjunctivae and EOM are normal. Pupils are equal, round, and reactive to light.  Neck: Normal range of motion. Neck supple.  Cardiovascular: Normal rate, regular rhythm and intact distal pulses.  No murmur.  Pulmonary/Chest: Effort normal and breath sounds normal. No respiratory distress. She has no wheezes. She has no rales. She exhibits no tenderness.  Musculoskeletal: Normal range of motion.  Neurological: She is alert and oriented to person, place, and time. No cranial nerve deficit.  Skin: Skin is warm and dry. She is not diaphoretic.  Extremeties: 1+ pitting edema in legs. 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 Dressing is dry, no drainage. Exposed toes are warm. No foul odor.  Psychiatric: Affect and judgment normal.    Assessment & Plan:

## 2013-06-09 NOTE — Assessment & Plan Note (Signed)
Patient is currently taking Lantus 4 units in the morning and 4 units in the evening. She is also doing sliding scale insulin. She checks her blood sugar frequently. Her CBG was 176 the morning. Will continue currently regimen.

## 2013-06-09 NOTE — Assessment & Plan Note (Signed)
Her Cre is near her baseline at discharge on 8/19. She has a followup appointment with a Dr. Hyman Hopes on 06/26/13.  -will check BMP and Mg -Will also check CK since her CK was elevated in the hospital. Her Lipitor was held and then restarted on discharge when her Ck was normalized.

## 2013-06-09 NOTE — Progress Notes (Signed)
Patient presents today for followup of her right fifth toe ray amputation on 05/27/2013. She had angioplasty of a severe superficial femoral artery stenosis several days prior to this. She was discharged last week. She does continue on IV antibiotics as directed by infectious disease.  Her foot looks quite good today. Her sutures are in tact. She does have good primary healing. She does have a superficial ulceration over her Achilles tendon but no other foot lesion  Impression and plan Tatsuya Okray maceration. She will be seen again in 2 weeks for suture removal. I am out of town that weeks and she will see the nurses for suture removal and a quick check by Dr. Myra Gianotti

## 2013-06-09 NOTE — Patient Instructions (Signed)
1. Please come back in 3 weeks.  2. Please take all medications as prescribed.  3. If you have worsening of your symptoms or new symptoms arise, please call the clinic ([phone]), or go to the ER immediately if symptoms are severe.  You have done great job in taking all your medications. I appreciate it very much. Please continue doing that.

## 2013-06-09 NOTE — Assessment & Plan Note (Addendum)
Patient is s/p of right fifth toe amputation on 05/27/13 due to osteomyelitis. Currently she is taking linezolid and iv  Meropenem. She just visited orthopedic surgeon's office the morning. She was told by [name] that her amputation wound is healing well as expected. She does not have fever or chills.  - will continue current regimen.  - she will complete 4 week course of antibiotics. - will check CBC

## 2013-06-09 NOTE — Assessment & Plan Note (Signed)
It is stable. She reports that she gained 27 pounds during hospitalization. Since discharged from hospital, she lost 9 Lbs. She is taking  Lasix 20 mg twice a day.  She still has 1+ pitting edema, but no JVD or SOB.  Her lung is clear. Since she has been losing weight, will continue current dose of lasix.  -will check BMP

## 2013-06-09 NOTE — Assessment & Plan Note (Signed)
Her blood pressure is 110/60 today. She is taking Lasix and metoprolol which are also for her congestive heart failure. She might be benefited from ACE inhibitor after acute issue resolved given her CKD and DM-I.   -will continue currently regimen for now.

## 2013-06-10 LAB — BASIC METABOLIC PANEL WITH GFR
BUN: 25 mg/dL — ABNORMAL HIGH (ref 6–23)
Chloride: 102 mEq/L (ref 96–112)
GFR, Est Non African American: 38 mL/min — ABNORMAL LOW
Potassium: 4.5 mEq/L (ref 3.5–5.3)

## 2013-06-11 ENCOUNTER — Telehealth: Payer: Self-pay

## 2013-06-11 NOTE — Progress Notes (Signed)
Case discussed with Dr. Niu at the time of the visit.  We reviewed the resident's history and exam and pertinent patient test results.  I agree with the assessment, diagnosis, and plan of care documented in the resident's note.    

## 2013-06-11 NOTE — Telephone Encounter (Signed)
[name], RN, Adv. [facility] nurse, called for clarification of orders.  Questioned about recommendations for cleaning surgical site, and frequency of visits.  Advised that [name] would support cleaning the surgical site with Dial soap/ rinsing well/ covering with clean, dry dressing daily.  Recommended that a HH RN assess the right foot and toe amp. site 2x/ week, and report s/s of infection or worsening of condition of foot. Informed HH RN that I spoke with pt. this AM, and she has a friend, that is a retired Engineer, civil (consulting), that will be assisting with the dressing changes daily. [facility] RN stated she will complete the orders to indicate performing a nurse assessment of right foot surgical site 2x/week.

## 2013-06-15 DIAGNOSIS — I96 Gangrene, not elsewhere classified: Secondary | ICD-10-CM | POA: Diagnosis not present

## 2013-06-15 DIAGNOSIS — A4902 Methicillin resistant Staphylococcus aureus infection, unspecified site: Secondary | ICD-10-CM | POA: Diagnosis not present

## 2013-06-15 DIAGNOSIS — E1129 Type 2 diabetes mellitus with other diabetic kidney complication: Secondary | ICD-10-CM | POA: Diagnosis not present

## 2013-06-15 DIAGNOSIS — E1159 Type 2 diabetes mellitus with other circulatory complications: Secondary | ICD-10-CM | POA: Diagnosis not present

## 2013-06-15 DIAGNOSIS — S98139A Complete traumatic amputation of one unspecified lesser toe, initial encounter: Secondary | ICD-10-CM | POA: Diagnosis not present

## 2013-06-15 DIAGNOSIS — Z452 Encounter for adjustment and management of vascular access device: Secondary | ICD-10-CM | POA: Diagnosis not present

## 2013-06-16 ENCOUNTER — Telehealth: Payer: Self-pay | Admitting: Dietician

## 2013-06-16 ENCOUNTER — Other Ambulatory Visit: Payer: Self-pay | Admitting: Internal Medicine

## 2013-06-16 ENCOUNTER — Telehealth: Payer: Self-pay | Admitting: *Deleted

## 2013-06-16 DIAGNOSIS — E039 Hypothyroidism, unspecified: Secondary | ICD-10-CM

## 2013-06-16 DIAGNOSIS — E1029 Type 1 diabetes mellitus with other diabetic kidney complication: Secondary | ICD-10-CM

## 2013-06-16 DIAGNOSIS — E1065 Type 1 diabetes mellitus with hyperglycemia: Secondary | ICD-10-CM

## 2013-06-16 MED ORDER — GLUCOSE BLOOD VI STRP: ORAL_STRIP | Status: DC

## 2013-06-16 MED ORDER — LEVOTHYROXINE SODIUM 125 MCG PO TABS: 125.0000 ug | ORAL_TABLET | Freq: Every day | ORAL | Status: DC

## 2013-06-16 NOTE — Telephone Encounter (Signed)
Says she goes to [facility] for her eye exams and has not been in over a year and cannot deal with that right now but will follow up when she can.   Patient  requests refills on test strips and levothyroxine because she has been unable to get to endocrinologist and they won't refill until she is seen there. She check her blood sugar 3-4 times a day requests 100 strips/month and uses one touch ultra blue strips. Note-  [facility] [address] [facility] asks for testing supply rx to be printed,  hand signed and faxed.

## 2013-06-16 NOTE — Telephone Encounter (Signed)
Agree with checking TSH on 06/30/13 since over one yr since last TSH and pt not going to endo.

## 2013-06-16 NOTE — Telephone Encounter (Signed)
Called pt to schedule lab appt for levothyroxine refill, she states she has too many dr appts at the moment and is planning to see dr Clyde Lundborg 06/30/2013 and could the labs be done then, i told her that would be fine and if she wanted to mention it to dr vandam 9/8 if he ordered labs she could and he might possibly add this to the labwork he does, she is agreeable to do so.

## 2013-06-16 NOTE — Telephone Encounter (Signed)
Printed rx for test strips was [facility]

## 2013-06-18 DIAGNOSIS — S98139A Complete traumatic amputation of one unspecified lesser toe, initial encounter: Secondary | ICD-10-CM | POA: Diagnosis not present

## 2013-06-18 DIAGNOSIS — E1129 Type 2 diabetes mellitus with other diabetic kidney complication: Secondary | ICD-10-CM | POA: Diagnosis not present

## 2013-06-18 DIAGNOSIS — E1159 Type 2 diabetes mellitus with other circulatory complications: Secondary | ICD-10-CM | POA: Diagnosis not present

## 2013-06-18 DIAGNOSIS — A4902 Methicillin resistant Staphylococcus aureus infection, unspecified site: Secondary | ICD-10-CM | POA: Diagnosis not present

## 2013-06-18 DIAGNOSIS — Z452 Encounter for adjustment and management of vascular access device: Secondary | ICD-10-CM | POA: Diagnosis not present

## 2013-06-18 DIAGNOSIS — I96 Gangrene, not elsewhere classified: Secondary | ICD-10-CM | POA: Diagnosis not present

## 2013-06-19 ENCOUNTER — Encounter: Payer: Self-pay | Admitting: Surgery

## 2013-06-22 ENCOUNTER — Encounter: Payer: Self-pay | Admitting: Surgery

## 2013-06-22 ENCOUNTER — Encounter: Payer: Self-pay | Admitting: Infectious Disease

## 2013-06-22 ENCOUNTER — Telehealth: Payer: Self-pay | Admitting: Infectious Disease

## 2013-06-22 ENCOUNTER — Ambulatory Visit (INDEPENDENT_AMBULATORY_CARE_PROVIDER_SITE_OTHER): Payer: Self-pay | Admitting: Surgery

## 2013-06-22 ENCOUNTER — Other Ambulatory Visit: Payer: Self-pay | Admitting: *Deleted

## 2013-06-22 ENCOUNTER — Ambulatory Visit (INDEPENDENT_AMBULATORY_CARE_PROVIDER_SITE_OTHER): Payer: Medicare Other | Admitting: Infectious Disease

## 2013-06-22 VITALS — BP 138/74 | HR 85 | Temp 97.5°F | Wt 173.0 lb

## 2013-06-22 VITALS — BP 154/65 | HR 77 | Temp 98.0°F | Ht 63.5 in | Wt 172.4 lb

## 2013-06-22 DIAGNOSIS — I739 Peripheral vascular disease, unspecified: Secondary | ICD-10-CM

## 2013-06-22 DIAGNOSIS — M869 Osteomyelitis, unspecified: Secondary | ICD-10-CM | POA: Diagnosis not present

## 2013-06-22 DIAGNOSIS — L98499 Non-pressure chronic ulcer of skin of other sites with unspecified severity: Secondary | ICD-10-CM

## 2013-06-22 DIAGNOSIS — M9080 Osteopathy in diseases classified elsewhere, unspecified site: Secondary | ICD-10-CM | POA: Diagnosis not present

## 2013-06-22 DIAGNOSIS — E1169 Type 2 diabetes mellitus with other specified complication: Secondary | ICD-10-CM

## 2013-06-22 DIAGNOSIS — E039 Hypothyroidism, unspecified: Secondary | ICD-10-CM

## 2013-06-22 DIAGNOSIS — Z48812 Encounter for surgical aftercare following surgery on the circulatory system: Secondary | ICD-10-CM

## 2013-06-22 LAB — CBC WITH DIFFERENTIAL/PLATELET
Basophils Absolute: 0 10*3/uL (ref 0.0–0.1)
Lymphocytes Relative: 26 % (ref 12–46)
Lymphs Abs: 0.8 10*3/uL (ref 0.7–4.0)
Neutro Abs: 1.7 10*3/uL (ref 1.7–7.7)
Neutrophils Relative %: 53 % (ref 43–77)
Platelets: 54 10*3/uL — ABNORMAL LOW (ref 150–400)
RBC: 2.96 MIL/uL — ABNORMAL LOW (ref 3.87–5.11)
WBC: 3.1 10*3/uL — ABNORMAL LOW (ref 4.0–10.5)

## 2013-06-22 LAB — SEDIMENTATION RATE: Sed Rate: 18 mm/hr (ref 0–22)

## 2013-06-22 MED ORDER — DOXYCYCLINE HYCLATE 100 MG PO TABS: 100.0000 mg | ORAL_TABLET | Freq: Two times a day (BID) | ORAL | Status: DC

## 2013-06-22 MED ORDER — FLUCONAZOLE 100 MG PO TABS: 100.0000 mg | ORAL_TABLET | Freq: Every day | ORAL | Status: DC

## 2013-06-22 NOTE — Progress Notes (Signed)
The patient comes in today for followup. She is status post atherectomy and angioplasty of her right superficial femoral artery on 05/27/2013. Later that day she underwent ray amputation of the right fifth toe. She has multiple complaints about thrush, diarrhea and intolerance of antibiotics as well as right leg swelling.  On examination the amputation site appears to be healing nicely. There is edema in the entire right leg.  No significant cellulitis is noted. She has a biphasic Doppler signal in her dorsalis pedis.  The patient sutures will be removed today. She is going to see ID later on today. I have scheduled her to come back to see Dr. early in one month. Prior to her visit she will have a duplex of her right leg to evaluate her previous intervention.    Durene Cal

## 2013-06-22 NOTE — Telephone Encounter (Signed)
Patients Platelets are 56k.   I left message on her home phone to STOP zyvox,   I have entered rx for doxy.  She needs repeat labs in 2 days to make sure her Plt coming back up

## 2013-06-22 NOTE — Progress Notes (Signed)
Subjective:    Patient ID: [name], female    DOB: 1946/01/27, 67 y.o.   MRN: [id number]  Diarrhea  Pertinent negatives include no abdominal pain, arthralgias, chills, coughing, fever, myalgias or vomiting.    67 yo with DM, PVD and osteomyelitis with AKI with concern in part for  vancomycin toxicity, utimately with resection of 5th ray by [name] after baloon angioplasty to improve flow. I had changed her to empiric regimen of cubicin and merrem. No cultures were obtained to guide therapy so I proceeded withempiric regimen. She then developed elevated CK on daptomycin and this was changed to oral zyvox with continued merrem.   Since then she has been seen by [facility] and [facility]< Dr Dierdre Searles and [facility] have been happy with her progress.   The patient herself is suffering from mx complaints that she believes may be related to the abx. She had arecent vaginal yeast infection that required clotrimazole rx now she is having cracked lips and dysphagia that she fears is beginnings of oral thrush. She has poor appetitie, nausea and 2+ loose stools per day.      Review of Systems  Constitutional: Negative for fever, chills, diaphoresis, activity change, appetite change, fatigue and unexpected weight change.  HENT: Negative for congestion, sore throat, rhinorrhea, sneezing, trouble swallowing and sinus pressure.   Eyes: Negative for photophobia and visual disturbance.  Respiratory: Negative for cough, chest tightness, shortness of breath, wheezing and stridor.   Cardiovascular: Negative for chest pain, palpitations and leg swelling.  Gastrointestinal: Positive for diarrhea. Negative for nausea, vomiting, abdominal pain, constipation, blood in stool, abdominal distention and anal bleeding.  Genitourinary: Negative for dysuria, hematuria, flank pain and difficulty urinating.  Musculoskeletal: Negative for myalgias, back pain, joint swelling, arthralgias and gait problem.  Skin: Positive for pallor and  wound. Negative for color change and rash.  Neurological: Negative for dizziness, tremors, weakness and light-headedness.  Hematological: Negative for adenopathy. Does not bruise/bleed easily.  Psychiatric/Behavioral: Negative for behavioral problems, confusion, sleep disturbance, dysphoric mood, decreased concentration and agitation.       Objective:   Physical Exam  Constitutional: She is oriented to person, place, and time. She appears distressed.  HENT:  Head: Normocephalic and atraumatic.  Mouth/Throat: Oropharynx is clear and moist. No oropharyngeal exudate.  Eyes: Conjunctivae and EOM are normal. No scleral icterus.  Neck: Normal range of motion. Neck supple.  Cardiovascular: Normal rate, regular rhythm and normal heart sounds.   Pulmonary/Chest: Effort normal and breath sounds normal. No respiratory distress. She has no wheezes.  Abdominal: She exhibits no distension. There is no tenderness. There is no rebound.  Musculoskeletal:       Feet:  Neurological: She is alert and oriented to person, place, and time. She exhibits normal muscle tone. Coordination normal.  Skin: Skin is warm and dry. There is erythema. No pallor.  Psychiatric: Her behavior is normal. Judgment and thought content normal. Her mood appears anxious. She exhibits a depressed mood.          Assessment & Plan:   #1 Osteomyelitis: sp 5th ray ampuation. No cultures to guide therapy but pursued broad spectrum regimen to cover mRSA, mssa (both + on PCR from nose) and pseudomonas (prev isolated on culture) with merrem and zyvox. Her platelets were low when last checked in [facility]. I offered to narrow her down to doxy alone but she wanted to be as aggressive as possible with abx despitewhat she also perceives as effects of these abx  Will likely have to get rid of zyvox very shortly and got to doxy merrem to complete here course and would then either DC them altogether  I spent greater than 45 minutes with the  patient including greater than 50% of time in face to face counsel of the patient and in coordination of their care.   #2 Dysphagia cracked lips etc: could certainly be due to yeast, will start fluconazole x 14 days  #3 Loose stools: X2  Per day not sufficient to warrant concern for CDI, likely abx effecdt  #4 TTpenia; Platelets down at last check in August, likely due to zyvox--see above  #5 PICC line irritaiton: minimal would watch this  #6 bruising near picc line: had been called about this by [facility]. This has resolved.

## 2013-06-23 DIAGNOSIS — E1159 Type 2 diabetes mellitus with other circulatory complications: Secondary | ICD-10-CM | POA: Diagnosis not present

## 2013-06-23 DIAGNOSIS — A4902 Methicillin resistant Staphylococcus aureus infection, unspecified site: Secondary | ICD-10-CM | POA: Diagnosis not present

## 2013-06-23 DIAGNOSIS — S98139A Complete traumatic amputation of one unspecified lesser toe, initial encounter: Secondary | ICD-10-CM | POA: Diagnosis not present

## 2013-06-23 DIAGNOSIS — I96 Gangrene, not elsewhere classified: Secondary | ICD-10-CM | POA: Diagnosis not present

## 2013-06-23 DIAGNOSIS — Z452 Encounter for adjustment and management of vascular access device: Secondary | ICD-10-CM | POA: Diagnosis not present

## 2013-06-23 DIAGNOSIS — E1129 Type 2 diabetes mellitus with other diabetic kidney complication: Secondary | ICD-10-CM | POA: Diagnosis not present

## 2013-06-23 LAB — BASIC METABOLIC PANEL WITH GFR
CO2: 25 mEq/L (ref 19–32)
Chloride: 100 mEq/L (ref 96–112)
GFR, Est Non African American: 37 mL/min — ABNORMAL LOW
Sodium: 136 mEq/L (ref 135–145)

## 2013-06-23 LAB — TSH: TSH: 18.576 u[IU]/mL — ABNORMAL HIGH (ref 0.350–4.500)

## 2013-06-23 LAB — C-REACTIVE PROTEIN: CRP: <0.5 mg/dL (ref <0.60)

## 2013-06-24 ENCOUNTER — Telehealth: Payer: Self-pay | Admitting: *Deleted

## 2013-06-24 MED ORDER — DOXYCYCLINE HYCLATE 100 MG PO CAPS: 100.0000 mg | ORAL_CAPSULE | Freq: Two times a day (BID) | ORAL | Status: DC

## 2013-06-24 NOTE — Telephone Encounter (Signed)
Tablet form of doxycyline is too expensive.  Obtained order from Dr. Daiva Eves to change to capsule form.  Dr. Daiva Eves also ordered that pt stop Zyvox due to lab work changes.  [facility] [address]  Pt verbalized that she did receive Dr. Zenaida Niece Dam's phone message and did stop Zyvox.

## 2013-06-24 NOTE — Addendum Note (Signed)
Addended by: Jennet Maduro D on: 06/24/2013 02:08 PM   Modules accepted: Orders, Medications

## 2013-06-25 DIAGNOSIS — I96 Gangrene, not elsewhere classified: Secondary | ICD-10-CM | POA: Diagnosis not present

## 2013-06-25 DIAGNOSIS — Z452 Encounter for adjustment and management of vascular access device: Secondary | ICD-10-CM | POA: Diagnosis not present

## 2013-06-25 DIAGNOSIS — A4902 Methicillin resistant Staphylococcus aureus infection, unspecified site: Secondary | ICD-10-CM | POA: Diagnosis not present

## 2013-06-25 DIAGNOSIS — E1159 Type 2 diabetes mellitus with other circulatory complications: Secondary | ICD-10-CM | POA: Diagnosis not present

## 2013-06-25 DIAGNOSIS — S98139A Complete traumatic amputation of one unspecified lesser toe, initial encounter: Secondary | ICD-10-CM | POA: Diagnosis not present

## 2013-06-25 DIAGNOSIS — E1129 Type 2 diabetes mellitus with other diabetic kidney complication: Secondary | ICD-10-CM | POA: Diagnosis not present

## 2013-06-26 DIAGNOSIS — I10 Essential (primary) hypertension: Secondary | ICD-10-CM | POA: Diagnosis not present

## 2013-06-26 DIAGNOSIS — D649 Anemia, unspecified: Secondary | ICD-10-CM | POA: Diagnosis not present

## 2013-06-26 DIAGNOSIS — N179 Acute kidney failure, unspecified: Secondary | ICD-10-CM | POA: Diagnosis not present

## 2013-06-29 ENCOUNTER — Telehealth: Payer: Self-pay

## 2013-06-29 DIAGNOSIS — S98139A Complete traumatic amputation of one unspecified lesser toe, initial encounter: Secondary | ICD-10-CM | POA: Diagnosis not present

## 2013-06-29 DIAGNOSIS — A4902 Methicillin resistant Staphylococcus aureus infection, unspecified site: Secondary | ICD-10-CM | POA: Diagnosis not present

## 2013-06-29 DIAGNOSIS — E1159 Type 2 diabetes mellitus with other circulatory complications: Secondary | ICD-10-CM | POA: Diagnosis not present

## 2013-06-29 DIAGNOSIS — I96 Gangrene, not elsewhere classified: Secondary | ICD-10-CM | POA: Diagnosis not present

## 2013-06-29 DIAGNOSIS — E1129 Type 2 diabetes mellitus with other diabetic kidney complication: Secondary | ICD-10-CM | POA: Diagnosis not present

## 2013-06-29 DIAGNOSIS — Z452 Encounter for adjustment and management of vascular access device: Secondary | ICD-10-CM | POA: Diagnosis not present

## 2013-06-29 NOTE — Telephone Encounter (Signed)
Patient would like to stop the IV Antibiotics due to multiple side effects. She states she expressed this to him at the last office visit . "She just can't do this anymore".  She is due to finish four weeks for three more days.  Labs were drawn today.  She will continue the oral medications.  Please advise

## 2013-06-30 ENCOUNTER — Encounter: Payer: Medicare Other | Admitting: Internal Medicine

## 2013-06-30 ENCOUNTER — Telehealth: Payer: Self-pay | Admitting: *Deleted

## 2013-06-30 NOTE — Telephone Encounter (Signed)
Pt has a scheduled appointment today.. It's a f/u on last visit with check on TSH,  but pt states she feels sick and not able to come in.   Pt is weak, stomach is upset and nausea Pt wants to stop antibiotics, she has a call into ID doctors about this.  She feels SOB  And would like a call from Dr Clyde Lundborg @ # [phone] I scheduled an appointment in clinic for Friday of this week.

## 2013-06-30 NOTE — Telephone Encounter (Signed)
I called patient. She reports not feeling well. Specifically, she has nausea from taking the antibiotics. She was told by her vascular surgeon that her foot infection is healing well, she can stop the antibiotics if she can not tolerate it.  I advised pt to speak to Dr. Daiva Eves before she stops her antibiotics. She also reports that her SOB is getting worse. She reports that she is losing weight rather than gaining weight. I suggested her to come to ED immediately if her SOB gets sudden worse (due to the possibility of PE). She has an appointment with me on Friday.   [name], MD PGY3, Internal Medicine Teaching Service Pager: [phone]

## 2013-06-30 NOTE — Telephone Encounter (Signed)
Fine to stop them she will need central line removed as well

## 2013-07-01 ENCOUNTER — Telehealth: Payer: Self-pay

## 2013-07-01 NOTE — Telephone Encounter (Signed)
Pt will need central line removed.  Message left with interventional radiology for scheduling today if possible.  Tomasita Morrow, RN

## 2013-07-02 ENCOUNTER — Telehealth: Payer: Self-pay | Admitting: *Deleted

## 2013-07-02 DIAGNOSIS — E1169 Type 2 diabetes mellitus with other specified complication: Secondary | ICD-10-CM

## 2013-07-02 NOTE — Telephone Encounter (Signed)
Obtained verbal order from Dr. Daiva Eves to stop IV antibiotics and removal of tunneled PICC by Interventional Radiology.  Called IR, left message to call pt as soon as possible to schedule removal.  Requested IR to call [facility] to let us know when pt is scheduled.  [facility] order to stop IV antibiotics.  Pt advised that IR would be calling her to schedule appt.

## 2013-07-03 ENCOUNTER — Encounter: Payer: Self-pay | Admitting: Internal Medicine

## 2013-07-03 ENCOUNTER — Ambulatory Visit (INDEPENDENT_AMBULATORY_CARE_PROVIDER_SITE_OTHER): Payer: Medicare Other | Admitting: Internal Medicine

## 2013-07-03 ENCOUNTER — Telehealth: Payer: Self-pay

## 2013-07-03 VITALS — BP 127/59 | HR 78 | Temp 97.9°F | Ht 63.5 in | Wt 171.5 lb

## 2013-07-03 DIAGNOSIS — E1029 Type 1 diabetes mellitus with other diabetic kidney complication: Secondary | ICD-10-CM

## 2013-07-03 DIAGNOSIS — E1169 Type 2 diabetes mellitus with other specified complication: Secondary | ICD-10-CM

## 2013-07-03 DIAGNOSIS — Z0000 Encounter for general adult medical examination without abnormal findings: Secondary | ICD-10-CM

## 2013-07-03 DIAGNOSIS — E109 Type 1 diabetes mellitus without complications: Secondary | ICD-10-CM | POA: Diagnosis not present

## 2013-07-03 DIAGNOSIS — M9080 Osteopathy in diseases classified elsewhere, unspecified site: Secondary | ICD-10-CM

## 2013-07-03 DIAGNOSIS — E538 Deficiency of other specified B group vitamins: Secondary | ICD-10-CM

## 2013-07-03 DIAGNOSIS — D518 Other vitamin B12 deficiency anemias: Secondary | ICD-10-CM | POA: Diagnosis not present

## 2013-07-03 DIAGNOSIS — M869 Osteomyelitis, unspecified: Secondary | ICD-10-CM

## 2013-07-03 DIAGNOSIS — N183 Chronic kidney disease, stage 3 unspecified: Secondary | ICD-10-CM | POA: Diagnosis not present

## 2013-07-03 DIAGNOSIS — I509 Heart failure, unspecified: Secondary | ICD-10-CM | POA: Diagnosis not present

## 2013-07-03 DIAGNOSIS — N058 Unspecified nephritic syndrome with other morphologic changes: Secondary | ICD-10-CM

## 2013-07-03 MED ORDER — CYANOCOBALAMIN 1000 MCG/ML IJ SOLN
1000.0000 ug | Freq: Once | INTRAMUSCULAR | Status: AC
  Administered 2013-07-03: 1000 ug via INTRAMUSCULAR

## 2013-07-03 MED ORDER — CYANOCOBALAMIN 1000 MCG/ML IJ SOLN: 1000.0000 ug | Freq: Once | INTRAMUSCULAR | Status: DC

## 2013-07-03 NOTE — Progress Notes (Signed)
Patient ID: [name], female   DOB: 06-27-1946, 67 y.o.   MRN: [id number] Subjective:   Patient ID: [name] female   DOB: 1946/08/28 67 y.o.   MRN: [id number]  CC:   Follow up visit.   HPI:  [name] is a 67 y.o. lady with past medical history as outlined below, who presents for a followup visit today  1. Root foot osteomyelitis: Because of the right 5th toe osteomyelitis, patient did 5th toe Ray amputation on 05/27/13. She has completed 4 weeks of IV meropenem and oral doxycycline treatment on 9/15. She was seen by Dr. Myra Gianotti on 06/22/13. She was told that her wound was heating well. Currently patient does not have fever or chills. She does not have foot pain anymore. During the treatment with antibiotics, she had severe GI side effects, including nausea and decreased appetite. After she stopped antibiotics, her appetite improved significantly. She does not have any nausea, vomiting or abdominal pain.   2. CHF: Her 2-D echo on 05/29/13 showed EF of 30%. She is currently taking Lasix 40 mg daily as suggested by her renal doctor, Dr. Hyman Hopes. She does not have chest pain. Her shortness is at her baseline. She still has leg edema. Her body weight decreased from 181 on 06/09/13 to 171 pounds today. Her blood pressure is normal today. She has an appointment with the [name] in next week. She does not want to make any changes on CHF medications until she see [name].  3. DM-I: Her recent A1c was 9.8 on 05/22/13. Currently she is taking Lantus 4 units twice a day and Humalog by sliding scale insulin. She does not have symptoms of hypo-or hyperglycemia.   4. CKD-III: Her baseline creatinine is between 1.5-2.0. Her recent creatinine was 1.46 on 06/22/13. She is followed up by Dr. Hyman Hopes.  ROS:  Denies fever, chills, headaches,  cough, chest pain, abdominal pain, diarrhea, constipation, dysuria, urgency, frequency, hematuria.   Past Medical History  Diagnosis Date  . HTN  (hypertension)   . Hypothyroidism   . History of recurrent UTIs   . CAD (coronary artery disease)     s/p CABG x3 in 1993; last cath in 2010  . Scleroderma   . Bilateral carpal tunnel syndrome 1970s  . 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 Followed by [name]  . Claudication   . Aortic stenosis     0.8cm2 on echo in 10/2011  . Hyperlipidemia     on Lipitor  . Cat bite 2009    with MRSA; required I & D  . Heart murmur   . Blood transfusion     no reaction from transfusion; "when I had heart surgery" (08/28/2012)  . GERD (gastroesophageal reflux disease)   . CHF (congestive heart failure) Aug. 2012    Echo 10/2011: Mild LVH, EF 30%, apex akinetic, septal HK, grade 2 diastolic dysfunction, or functionally bicuspid aortic valve, mild aortic stenosis by gradient, severe by calculated AVA-suspect A. Aortic stenosis probably moderate, trivial MR, mild LAE, mild RAE.   [name] Cellulitis   . Anemia   . B12 deficiency anemia     B 12 injection "monthly since 05/2012" (08/28/2012)  . Iron deficiency anemia 06/13/2012    "iron infusion" (08/28/2012  . Anginal pain     "history; not currently" (08/28/2012)  . Myocardial infarction 1986    "silent"  . History of  bronchitis     "I've had it twice in my life" (08/28/2012)  . Exertional dyspnea     "because of the heart failure" (08/28/2012)  . Type 1 diabetes 10/1949  . Stomach ulcer 1981?    "on Tagament for ~ 1 yr" (08/28/2012)  . Seizure     ? seizure like event in 2010; workup included stress testing which led to repeat cath.   . Arthritis     "hands, hips, all over" (08/28/2012  . Osteoarthritis of both feet   . Breast cancer     left  . Cellulitis June 2013, Nov. 2013 and Feb. 14, 2014    Left leg   Current Outpatient Prescriptions  Medication Sig Dispense Refill  . acetaminophen (TYLENOL) 325 MG tablet Take 650 mg by mouth every 6 (six) hours as needed for fever.       [name] aspirin EC 325 MG EC tablet Take 325 mg by mouth daily.       [name] atorvastatin (LIPITOR) 40 MG tablet Take 40 mg by mouth at bedtime.      . cyanocobalamin (,VITAMIN B-12,) 1000 MCG/ML injection Inject 1,000 mcg into the muscle every 30 (thirty) days.      . cyanocobalamin (,VITAMIN B-12,) 1000 MCG/ML injection Inject 1 mL (1,000 mcg total) into the muscle once.  1 mL  0  . docusate sodium (COLACE) 50 MG capsule Take by mouth 2 (two) times daily.      . Ferrous Sulfate (SLOW RELEASE IRON) 50 MG TBCR Take 50 mg by mouth daily.       . fluconazole (DIFLUCAN) 100 MG tablet Take 1 tablet (100 mg total) by mouth daily.  14 tablet  2  . furosemide (LASIX) 20 MG tablet Take 40 mg by mouth daily.      [name] glucose blood test strip Use as instructed  100 each  11  . insulin glargine (LANTUS) 100 UNIT/ML injection Inject 0.04 mL (4 Units total) into the skin 2 (two) times daily.  10 mL  12  . Insulin Lispro, Human, (HUMALOG [place]) Inject into the skin. Sliding scale      . isosorbide mononitrate (IMDUR) 30 MG 24 hr tablet Take 30 mg by mouth at bedtime.       [name] levothyroxine (SYNTHROID, LEVOTHROID) 125 MCG tablet Take 1 tablet (125 mcg total) by mouth daily.  30 tablet  3  . metoprolol succinate (TOPROL-XL) 25 MG 24 hr tablet Take 25 mg by mouth daily.      [name] latanoprost (XALATAN) 0.005 % ophthalmic solution Place 1 drop into both eyes at bedtime.         No current facility-administered medications for this visit.   Family History  Problem Relation Age of Onset  . Diabetes Mother    History   Social History  . Marital Status: Single    Spouse Name: N/A    Number of Children: N/A  . Years of Education: N/A   Occupational History  . Environmental health practitioner    Social History Main Topics  . Smoking status: Never Smoker   . Smokeless tobacco: Never Used  . Alcohol Use: No  . Drug Use: No  . Sexual Activity: Not Currently    Birth Control/ Protection: Post-menopausal   Other Topics Concern   . None   Social History Narrative  . None    Review of Systems: Full 14-point review of systems otherwise negative. See HPI.   Objective:  Physical Exam: Filed Vitals:  07/03/13 1524  BP: 127/59  Pulse: 78  Temp: 97.9 F (36.6 C)  TempSrc: Oral  Height: 5' 3.5" (1.613 m)  Weight: 171 lb 8 oz (77.792 kg)  SpO2: 98%   Head: Normocephalic and atraumatic.   Eyes: Conjunctivae and EOM are normal. Pupils are equal, round, and reactive to light.   Neck: Normal range of motion. Neck supple.   Cardiovascular: Normal rate, regular rhythm and intact distal pulses.  2/6 systolic murmur over aortic area.  Pulmonary/Chest: Effort normal and breath sounds normal. No respiratory distress. She has no wheezes. She has no rales. She exhibits no tenderness.  Musculoskeletal: Normal range of motion.  Neurological: She is alert and oriented to person, place, and time. No cranial nerve deficit.   Skin: Skin is warm and dry. She is not diaphoretic.   Extremeties: 1+ pitting edema in legs. 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 Dressing is dry, no drainage. Exposed toes are warm. No foul odor.  Psychiatric: Affect and judgment normal   Assessment & Plan:

## 2013-07-03 NOTE — Assessment & Plan Note (Signed)
Patient had 5th Ray amputation on 05/27/13. She completed 4 weeks of antibiotics treatment. Her wound is healing well. Currently she does not have fever or chills. Her foot pain improved significantly. No new treatment needed. Will follow up.

## 2013-07-03 NOTE — Telephone Encounter (Signed)
Patient called stating she has not heard from Interventional Radiology regarding Hickman catheter removal..   A call was made on 06-29-13 to schedule removal.  It looks as if orders were faxed twice this week.   I am not sure what has happened.  I will call Interventional Radiology now to see what we can do.   No answer at [facility]. Called [facility] -they were just finishing their last case and could not help today.   I spoke with Dr Daiva Eves to update him on this situation.  He wants to assure the Hickman is removed on Monday, July 06, 2013 for sure.   I spoke with patient and advised to continue heparin flushes until Monday.    Message left with Interventional Manger regarding situation.  We would like to accommodate pateint on Monday if possible.   Laurell Josephs, RN

## 2013-07-03 NOTE — Assessment & Plan Note (Signed)
-  will give vitamin B12 injection, 1000 mcg x1 today

## 2013-07-03 NOTE — Assessment & Plan Note (Signed)
Patient refused all healthcare maintenance-related tests or examinations, including eye examination, urine microalbumin, mammogram, colonoscopy, Zostavax, pneumococcal vaccination, and influenza vaccine.

## 2013-07-03 NOTE — Assessment & Plan Note (Signed)
Patient is currently taking Lantus 4 units twice a day. She is also doing Humalog sliding scale insulin injection. She checks her blood sugar frequently. We continue current regimen.

## 2013-07-03 NOTE — Patient Instructions (Signed)
1. You have done great job in taking all your medications. I appreciate it very much. Please continue doing that. 2. Please take all medications as prescribed.  3. If you have worsening of your symptoms or new symptoms arise, please call the clinic ([phone]), or go to the ER immediately if symptoms are severe.     

## 2013-07-03 NOTE — Assessment & Plan Note (Signed)
Patient is currently taking Lasix 40 mg daily, as suggested by her renal doctor Dr. Hyman Hopes. She still has 1+ pitting edema in legs, but her body weight decreased since August 26. Her SOB is at her baseline. Her lung auscultation is clear bilaterally. She does not have signs of acute CHF exacerbation. She has an appointment with her cardiologist, [name] in next week. She does not want to make any adjustment for her CHF medications until she sees [name]. Will follow up.

## 2013-07-03 NOTE — Assessment & Plan Note (Signed)
It is stable. Her recent creatinine on 06/22/13 is at her baseline. She is followed up Dr. Hyman Hopes. Will follow up

## 2013-07-04 DIAGNOSIS — Z452 Encounter for adjustment and management of vascular access device: Secondary | ICD-10-CM | POA: Diagnosis not present

## 2013-07-04 DIAGNOSIS — E1159 Type 2 diabetes mellitus with other circulatory complications: Secondary | ICD-10-CM | POA: Diagnosis not present

## 2013-07-04 DIAGNOSIS — I96 Gangrene, not elsewhere classified: Secondary | ICD-10-CM | POA: Diagnosis not present

## 2013-07-04 DIAGNOSIS — E1129 Type 2 diabetes mellitus with other diabetic kidney complication: Secondary | ICD-10-CM | POA: Diagnosis not present

## 2013-07-04 DIAGNOSIS — S98139A Complete traumatic amputation of one unspecified lesser toe, initial encounter: Secondary | ICD-10-CM | POA: Diagnosis not present

## 2013-07-04 DIAGNOSIS — A4902 Methicillin resistant Staphylococcus aureus infection, unspecified site: Secondary | ICD-10-CM | POA: Diagnosis not present

## 2013-07-06 ENCOUNTER — Telehealth: Payer: Self-pay | Admitting: *Deleted

## 2013-07-06 ENCOUNTER — Ambulatory Visit (HOSPITAL_COMMUNITY)
Admission: RE | Admit: 2013-07-06 | Discharge: 2013-07-06 | Disposition: A | Discharge status: Home / Self Care | Payer: Medicare Other | Source: Ambulatory Visit | Attending: Infectious Disease | Admitting: Infectious Disease

## 2013-07-06 ENCOUNTER — Other Ambulatory Visit: Payer: Self-pay | Admitting: Infectious Disease

## 2013-07-06 ENCOUNTER — Ambulatory Visit: Payer: Medicare Other

## 2013-07-06 DIAGNOSIS — A4902 Methicillin resistant Staphylococcus aureus infection, unspecified site: Secondary | ICD-10-CM | POA: Diagnosis not present

## 2013-07-06 DIAGNOSIS — Z452 Encounter for adjustment and management of vascular access device: Secondary | ICD-10-CM | POA: Diagnosis not present

## 2013-07-06 DIAGNOSIS — E1169 Type 2 diabetes mellitus with other specified complication: Secondary | ICD-10-CM

## 2013-07-06 MED ORDER — CHLORHEXIDINE GLUCONATE 4 % EX LIQD
CUTANEOUS | Status: AC
  Filled 2013-07-06: qty 30

## 2013-07-06 NOTE — Telephone Encounter (Signed)
Attempted to call patient to discuss c/o of B-12 RX and Novolog Rx but no answer and phone recording stated voicemail was full. Will attempt to call pt back again tomorrow during office hours. Dorie Rank RN, 07/06/13, 5:11PM

## 2013-07-06 NOTE — Procedures (Signed)
Successful removal of right IJ tunneled hickman catheter placed 06/01/13 for antibiotic use. Patient no longer needs. Sterile dressing placed, no immediate complications.  Pattricia Boss PA-C Interventional Radiology  07/06/13  1:48 PM

## 2013-07-06 NOTE — Telephone Encounter (Signed)
Pt called and was upset on two Rx- one was for Vit B12 in clinic and was sent to pharmacy on 07/03/13 and other one was discharge fom [facility] 06/07/13 and was given novolog insulin - which she only takes while in [facility]. At home she is on lantus insulin. Pt aware this will be given to S. Powers RN. Stanton Kidney Maryetta Shafer RN 07/06/13 4:45PM

## 2013-07-06 NOTE — Telephone Encounter (Signed)
Left message on [facility] error on Vi tB12- was done in clinic x 1 on 07/03/13. Stanton Kidney Jamyla Ard RN 07/06/13 5PM

## 2013-07-07 ENCOUNTER — Other Ambulatory Visit (HOSPITAL_COMMUNITY): Payer: Self-pay | Admitting: *Deleted

## 2013-07-07 ENCOUNTER — Telehealth: Payer: Self-pay | Admitting: Internal Medicine

## 2013-07-07 DIAGNOSIS — S98139A Complete traumatic amputation of one unspecified lesser toe, initial encounter: Secondary | ICD-10-CM | POA: Diagnosis not present

## 2013-07-07 DIAGNOSIS — I96 Gangrene, not elsewhere classified: Secondary | ICD-10-CM | POA: Diagnosis not present

## 2013-07-07 DIAGNOSIS — E1159 Type 2 diabetes mellitus with other circulatory complications: Secondary | ICD-10-CM | POA: Diagnosis not present

## 2013-07-07 DIAGNOSIS — Z452 Encounter for adjustment and management of vascular access device: Secondary | ICD-10-CM | POA: Diagnosis not present

## 2013-07-07 DIAGNOSIS — E1129 Type 2 diabetes mellitus with other diabetic kidney complication: Secondary | ICD-10-CM | POA: Diagnosis not present

## 2013-07-07 DIAGNOSIS — A4902 Methicillin resistant Staphylococcus aureus infection, unspecified site: Secondary | ICD-10-CM | POA: Diagnosis not present

## 2013-07-07 NOTE — Telephone Encounter (Signed)
[facility] from [facility],called, she states pt was f/U by [facility] for IV antibiotics for  Infections disease service. Pt's PIC was D/C, so pt will be drop from their care. [name] states pt needs to continues to be seen due to her Heart Failure, if MD agrees [facility] needs orders. I Will send this message to [name] for reviewing.

## 2013-07-07 NOTE — Telephone Encounter (Signed)
[facility] states pt's pic line has been d/c... HomeCare is asking if [name] wants [facility] to continue following the pt for their CHF. HomeCare also states pt still has edema from pt's recent hospital admission. Please call [facility] back.

## 2013-07-08 ENCOUNTER — Other Ambulatory Visit (INDEPENDENT_AMBULATORY_CARE_PROVIDER_SITE_OTHER): Payer: Medicare Other

## 2013-07-08 ENCOUNTER — Encounter (HOSPITAL_COMMUNITY): Payer: Medicare Other

## 2013-07-08 DIAGNOSIS — D509 Iron deficiency anemia, unspecified: Secondary | ICD-10-CM

## 2013-07-08 DIAGNOSIS — R0602 Shortness of breath: Secondary | ICD-10-CM | POA: Diagnosis not present

## 2013-07-08 DIAGNOSIS — E785 Hyperlipidemia, unspecified: Secondary | ICD-10-CM | POA: Diagnosis not present

## 2013-07-08 LAB — CBC WITH DIFFERENTIAL/PLATELET
Basophils Absolute: 0 10*3/uL (ref 0.0–0.1)
Basophils Relative: 0.5 % (ref 0.0–3.0)
Eosinophils Absolute: 0.2 10*3/uL (ref 0.0–0.7)
Hemoglobin: 9.5 g/dL — ABNORMAL LOW (ref 12.0–15.0)
Lymphocytes Relative: 17.7 % (ref 12.0–46.0)
Lymphs Abs: 1 10*3/uL (ref 0.7–4.0)
MCHC: 32.5 g/dL (ref 30.0–36.0)
MCV: 92.2 fl (ref 78.0–100.0)
Monocytes Absolute: 0.7 10*3/uL (ref 0.1–1.0)
Neutro Abs: 3.5 10*3/uL (ref 1.4–7.7)
RBC: 3.17 Mil/uL — ABNORMAL LOW (ref 3.87–5.11)
RDW: 17.5 % — ABNORMAL HIGH (ref 11.5–14.6)

## 2013-07-08 LAB — HEPATIC FUNCTION PANEL
AST: 20 U/L (ref 0–37)
Alkaline Phosphatase: 108 U/L (ref 39–117)
Total Bilirubin: 1 mg/dL (ref 0.3–1.2)

## 2013-07-08 LAB — BASIC METABOLIC PANEL
CO2: 30 mEq/L (ref 19–32)
Calcium: 8.7 mg/dL (ref 8.4–10.5)
Chloride: 103 mEq/L (ref 96–112)
Glucose, Bld: 134 mg/dL — ABNORMAL HIGH (ref 70–99)
Sodium: 140 mEq/L (ref 135–145)

## 2013-07-08 LAB — LIPID PANEL
HDL: 29.1 mg/dL — ABNORMAL LOW (ref >39.00)
Total CHOL/HDL Ratio: 3

## 2013-07-08 NOTE — Telephone Encounter (Signed)
Patient had labs today  Has appt with me on Fri I Rec continued following by [facility] with her history of CHF and PVOD, DM.

## 2013-07-08 NOTE — Telephone Encounter (Signed)
Spoke with chris, aware dr Tenny Craw would like to continue [facility].

## 2013-07-09 NOTE — Telephone Encounter (Signed)
Pt reached by phone - B-12 Rx to pharmacy error had been corrected when IM physician discovered he had ordered the b-12 to be given during the visit incorrectly. Pt also concerned that RX was sent to pharmacy for Lasix and Lantus, which she does take but did not need new RX or refill and for Novolog, which pt takes only when in the hospital bc the [facility] formulary does not include the Humulog she takes and home and substitutes the Novolog while she is in the hospital. [facility] RX had been sent in by Dr. Janalyn Harder and pt states she has not seen him. I apologized for her frustrating experience, let her know the b-12 issue had been clarified and corrected and was hopefully preventable in the future but will have to f/u the last set of [facility]. Pt reiterated no RX should be called in for her without discussing the matter with her since she is part of the pt care team. I agreed with the pt that she is always part of her care team and is the expert on herself and we would follow up with the remaining 3 RX. I thanked her for her feedback and patience.

## 2013-07-10 ENCOUNTER — Ambulatory Visit (INDEPENDENT_AMBULATORY_CARE_PROVIDER_SITE_OTHER): Payer: Medicare Other | Admitting: Internal Medicine

## 2013-07-10 ENCOUNTER — Encounter: Payer: Self-pay | Admitting: Internal Medicine

## 2013-07-10 VITALS — BP 120/55 | HR 76 | Ht 63.5 in | Wt 170.9 lb

## 2013-07-10 DIAGNOSIS — I5023 Acute on chronic systolic (congestive) heart failure: Secondary | ICD-10-CM

## 2013-07-10 DIAGNOSIS — E785 Hyperlipidemia, unspecified: Secondary | ICD-10-CM

## 2013-07-10 DIAGNOSIS — E119 Type 2 diabetes mellitus without complications: Secondary | ICD-10-CM

## 2013-07-10 DIAGNOSIS — I509 Heart failure, unspecified: Secondary | ICD-10-CM

## 2013-07-10 DIAGNOSIS — I739 Peripheral vascular disease, unspecified: Secondary | ICD-10-CM

## 2013-07-10 DIAGNOSIS — I359 Nonrheumatic aortic valve disorder, unspecified: Secondary | ICD-10-CM | POA: Diagnosis not present

## 2013-07-10 DIAGNOSIS — I25810 Atherosclerosis of coronary artery bypass graft(s) without angina pectoris: Secondary | ICD-10-CM

## 2013-07-10 DIAGNOSIS — I350 Nonrheumatic aortic (valve) stenosis: Secondary | ICD-10-CM

## 2013-07-10 NOTE — Patient Instructions (Addendum)
Will arrange for Physical Therapy to come evaluate and treat  Your physician recommends that you schedule a follow-up appointment in: 07/23/13  INCREASE YOUR LASIX TO 60 MG TODAY AND TOMORROW AND THEN 60 MG EVERY OTHER DAY ALTERNATING WITH 40 MG

## 2013-07-10 NOTE — Progress Notes (Signed)
HPI Patient is a 67 yo with  a history of CAD (s/p CABG- cath in 09/2011 showed patent LIMA to LAD with occlusion of distal LAD; High grade stenosis of nondominant RCA; Signif disease of prox LCx; SVG to L PDA occluded, SVG to OM patent), Aortic stenosis, DM, dyslipidemia, POVD,   Echo 05/2013: Mild LVH, EF 30%, RVEF depressed;  mod aortic stenosis)  The patient was recently hospitalized for amputation of R toe and revasc of leg.   SInce d/cy she has noticed over the past few wks a progressive develoment of edema.  It is very hard for her to get around  Increased SOB  No signif chest pains.  Lasix at current dose does not appear to be working    Allergies  Allergen Reactions  . Adhesive [Tape] Other (See Comments)    "old [employer] adhesive tape; takes my skin off; can use paper tape"  . Zyvox [Linezolid]     Thrombocytopenia developed to 50k after several weeks of therapy  . Bactrim [Sulfamethoxazole W-Trimethoprim]     Hyperkalemia and Increased creatinine  . Cephalexin Swelling and Rash  . Ciprofloxacin Swelling and Rash  . Daptomycin     Had elevated CPK on therapy, not clear that this was due to cubicin  . Vancomycin     ? If this played role in her Acute kidney injury    Current Outpatient Prescriptions  Medication Sig Dispense Refill  . acetaminophen (TYLENOL) 325 MG tablet Take 650 mg by mouth every 6 (six) hours as needed for fever.      [name] aspirin EC 325 MG EC tablet Take 325 mg by mouth daily.       [name] atorvastatin (LIPITOR) 40 MG tablet Take 40 mg by mouth at bedtime.      . cyanocobalamin (,VITAMIN B-12,) 1000 MCG/ML injection Inject 1,000 mcg into the muscle every 30 (thirty) days.      . cyanocobalamin (,VITAMIN B-12,) 1000 MCG/ML injection Inject 1 mL (1,000 mcg total) into the muscle once.  1 mL  0  . docusate sodium (COLACE) 50 MG capsule Take by mouth 2 (two) times daily.      . Ferrous Sulfate (SLOW RELEASE IRON) 50 MG TBCR Take 50 mg by mouth daily.       .  fluconazole (DIFLUCAN) 100 MG tablet Take 1 tablet (100 mg total) by mouth daily.  14 tablet  2  . furosemide (LASIX) 20 MG tablet Take 40 mg by mouth daily.      [name] glucose blood test strip Use as instructed  100 each  11  . insulin glargine (LANTUS) 100 UNIT/ML injection Inject 0.04 mL (4 Units total) into the skin 2 (two) times daily.  10 mL  12  . Insulin Lispro, Human, (HUMALOG [place]) Inject into the skin. Sliding scale      . isosorbide mononitrate (IMDUR) 30 MG 24 hr tablet Take 30 mg by mouth at bedtime.       [name] latanoprost (XALATAN) 0.005 % ophthalmic solution Place 1 drop into both eyes at bedtime.        [name] levothyroxine (SYNTHROID, LEVOTHROID) 125 MCG tablet Take 1 tablet (125 mcg total) by mouth daily.  30 tablet  3  . metoprolol succinate (TOPROL-XL) 25 MG 24 hr tablet Take 25 mg by mouth daily.       No current facility-administered medications for this visit.    Past Medical History  Diagnosis Date  . HTN (hypertension)   . Hypothyroidism   .  History of recurrent UTIs   . CAD (coronary artery disease)     s/p CABG x3 in 1993; last cath in 2010  . Scleroderma   . Bilateral carpal tunnel syndrome 1970s  . 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 Followed by [name]  . Claudication   . Aortic stenosis     0.8cm2 on echo in 10/2011  . Hyperlipidemia     on Lipitor  . Cat bite 2009    with MRSA; required I & D  . Heart murmur   . Blood transfusion     no reaction from transfusion; "when I had heart surgery" (08/28/2012)  . GERD (gastroesophageal reflux disease)   . CHF (congestive heart failure) Aug. 2012    Echo 10/2011: Mild LVH, EF 30%, apex akinetic, septal HK, grade 2 diastolic dysfunction, or functionally bicuspid aortic valve, mild aortic stenosis by gradient, severe by calculated AVA-suspect A. Aortic stenosis probably moderate, trivial MR, mild LAE, mild RAE.   [name] Cellulitis   . Anemia   . B12  deficiency anemia     B 12 injection "monthly since 05/2012" (08/28/2012)  . Iron deficiency anemia 06/13/2012    "iron infusion" (08/28/2012  . Anginal pain     "history; not currently" (08/28/2012)  . Myocardial infarction 1986    "silent"  . History of bronchitis     "I've had it twice in my life" (08/28/2012)  . Exertional dyspnea     "because of the heart failure" (08/28/2012)  . Type 1 diabetes 10/1949  . Stomach ulcer 1981?    "on Tagament for ~ 1 yr" (08/28/2012)  . Seizure     ? seizure like event in 2010; workup included stress testing which led to repeat cath.   . Arthritis     "hands, hips, all over" (08/28/2012  . Osteoarthritis of both feet   . Breast cancer     left  . Cellulitis June 2013, Nov. 2013 and Feb. 14, 2014    Left leg    Past Surgical History  Procedure Laterality Date  . Carpal tunnel release  ~ 1970's    bilaterally  . Mastectomy  11/1982    Left Breast  . Vitrectomy  1990's-2004    "both eyes; I've had total of 4"  . Tubal ligation  1984  . Tonsillectomy  1952    "?adenoids"   . Appendectomy  1991  . Femoral bypass      left leg "related to transmetatarsal amputation" (08/28/2012)  . Breast biopsy with sentinel lymph node biopsy and needle localization  1984  . Coronary artery bypass graft  1992    LIMA to LAD, SVG to distal LCX & SVG to Marginal  . Cardiac catheterization  2010    LIMA to LAD patent yet with occluded distal LAD after graft insertion & retrograde is occluded. 3-4 septal perforators arise &are patent. SVG to a large marginal patent; SVG presumably to the distal dominant LCX is occluded, moderately high grade stenosis of the AV circumflex, but with distal stenoses involving a severely diseased marginal branch not amenable  to PCI  nor is inferior branch   . Cardiac catheterization  10/2011    "unsuccessful attempt of stenting" (08/28/2012)  . Cardiac catheterization  1992; 09/2011  . Breast biopsy    . Trigger finger release   1980's    right thumb  . Incision and drainage  of wound  ~ 1967    "infection in left foot" (08/28/2012)  . Incision and drainage of wound  2009    "3 ORs on my right hand after cat bite" (08/28/2012)  . 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 Cataract extraction w/ intraocular lens  implant, bilateral  1990's  . Amputation Right 05/27/2013    Procedure: AMPUTATION DIGIT FIFTH TOE;  Surgeon: Larina Earthly, MD;  Location: [facility] OR;  Service: Vascular;  Laterality: Right;    Family History  Problem Relation Age of Onset  . Diabetes Mother     History   Social History  . Marital Status: Single    Spouse Name: N/A    Number of Children: N/A  . Years of Education: N/A   Occupational History  . Environmental health practitioner    Social History Main Topics  . Smoking status: Never Smoker   . Smokeless tobacco: Never Used  . Alcohol Use: No  . Drug Use: No  . Sexual Activity: Not Currently    Birth Control/ Protection: Post-menopausal   Other Topics Concern  . Not on file   Social History Narrative  . No narrative on file    Review of Systems:  All systems reviewed.  They are negative to the above problem except as previously stated.  Vital Signs: BP 120/55  Pulse 76  Ht 5' 3.5" (1.613 m)  Wt 170 lb 14.4 oz (77.52 kg)  BMI 29.8 kg/m2  Physical Exam Patient is in NAD HEENT:  Normocephalic, atraumatic. EOMI, PERRLA.  Neck: JVP is increased.   Lungs: clear to auscultation. No rales no wheezes.  Heart: Regular rate and rhythm. Normal S1, S2. No S3.  III/VI systolic murmr  Abdomen:  Supple, nontender. Normal bowel sounds. No masses. No hepatomegaly. Back:  ++ edema    Extremities:   2+ edema worse in thighs  R foot wrapped   Musculoskeletal :moving all extremities.  Neuro:   alert and oriented x3.  CN II-XII grossly intact.   Assessment and Plan: 1.  Acute on chronic systolic CHF.  Patient appears volume overloaded  Will increase lasix to 60 qd  for 2 days then 60 alternating with 40 every day. Patient will call with response  Does not want to take K supplement  Will eat bananas and tomatoes and OJ  2.  CAD  Seems to be doing OK  Would continue on current regimen  2.  HTN Adequate control  3.  HL  Continue meds.  4.  PVOD  Follow at [facility]  5.  AS  Follow

## 2013-07-10 NOTE — Progress Notes (Signed)
Case discussed with Dr. Niu soon after the resident saw the patient.  We reviewed the resident's history and exam and pertinent patient test results.  I agree with the assessment, diagnosis, and plan of care documented in the resident's note. 

## 2013-07-13 ENCOUNTER — Telehealth: Payer: Self-pay | Admitting: Internal Medicine

## 2013-07-13 ENCOUNTER — Telehealth: Payer: Self-pay | Admitting: *Deleted

## 2013-07-13 NOTE — Telephone Encounter (Signed)
Pt called wanting to know if her 1 month follow up 10/8 is truly necessary, as she has MD appointments just about every day for the next 2 weeks.  She states that transportation is an issue for her and she is trying to consolidate her appointments.  Pt states she feels better, reports no fevers, states that her foot looks and feels much better, the diarrhea/yeast infection/and appetite loss have resolved since being off antibiotics.  If she just needs labs, can they be drawn at one of her other physicians' appointments that week?  Which labs would you need? Please advise. Andree Coss, RN

## 2013-07-13 NOTE — Telephone Encounter (Signed)
Patient called in I saw her on Friday in clinic  REcomm she take 60 mg lasix Fri/Vaterday.  She had some very minor response  Down 2 lbs.  REc that she go up to 80 of lasix and call tomorrow.

## 2013-07-13 NOTE — Telephone Encounter (Signed)
Returned call to [place] [facility] she wanted to know if Dr.Ross will order patient to have PT for rt toe amputation.Stated patient said Dr.Ross wanted her to have PT.Fax order to fax # [phone].Message sent to Dr.Ross.

## 2013-07-13 NOTE — Telephone Encounter (Signed)
New Problem   [name] called on behalf of the pt whom is asking for physical therapy.. pt states a referal was sent by [name] office a week prior, however [facility] has not recieved them. Please call to confirm that orders have been put in.

## 2013-07-13 NOTE — Telephone Encounter (Signed)
If she doesn't want to be followed by [place] in ID that is fine she can see her PCP and her surgeon. WE will need to make srue that the schedule reflects this. Keep in mind her doctors asked for our helps but if she has too many appts i can understand

## 2013-07-14 ENCOUNTER — Encounter: Payer: Self-pay | Admitting: Infectious Disease

## 2013-07-14 DIAGNOSIS — I96 Gangrene, not elsewhere classified: Secondary | ICD-10-CM | POA: Diagnosis not present

## 2013-07-14 DIAGNOSIS — S98139A Complete traumatic amputation of one unspecified lesser toe, initial encounter: Secondary | ICD-10-CM | POA: Diagnosis not present

## 2013-07-14 DIAGNOSIS — A4902 Methicillin resistant Staphylococcus aureus infection, unspecified site: Secondary | ICD-10-CM | POA: Diagnosis not present

## 2013-07-14 DIAGNOSIS — Z452 Encounter for adjustment and management of vascular access device: Secondary | ICD-10-CM | POA: Diagnosis not present

## 2013-07-14 DIAGNOSIS — E1129 Type 2 diabetes mellitus with other diabetic kidney complication: Secondary | ICD-10-CM | POA: Diagnosis not present

## 2013-07-14 DIAGNOSIS — E1159 Type 2 diabetes mellitus with other circulatory complications: Secondary | ICD-10-CM | POA: Diagnosis not present

## 2013-07-14 NOTE — Telephone Encounter (Signed)
Please fax order for physical therapy.

## 2013-07-14 NOTE — Telephone Encounter (Signed)
This telephone note will be placed in medical records for faxing.

## 2013-07-15 ENCOUNTER — Telehealth: Payer: Self-pay | Admitting: Internal Medicine

## 2013-07-15 NOTE — Telephone Encounter (Signed)
New problem   Pt stated orders wasn't fax over to [facility] for her to have PT. And also have question about labs.

## 2013-07-15 NOTE — Telephone Encounter (Signed)
Follow Up: ° °Pt states she is returning Debra's call °

## 2013-07-15 NOTE — Telephone Encounter (Addendum)
Spoke with pt, confirmed fax number that order for PT was faxed to. Also the pt is to have a BMP tomorrow due to the recent increase in her furosemide. Called and spoke with jenna at home health, verbal order given for PT and bmp tomorrow.

## 2013-07-15 NOTE — Telephone Encounter (Signed)
Left message for pt to call, aware fax will be sent to home health today.

## 2013-07-16 ENCOUNTER — Ambulatory Visit (INDEPENDENT_AMBULATORY_CARE_PROVIDER_SITE_OTHER): Payer: Self-pay | Admitting: Vascular Surgery

## 2013-07-16 ENCOUNTER — Encounter: Payer: Self-pay | Admitting: Vascular Surgery

## 2013-07-16 ENCOUNTER — Encounter: Payer: Self-pay | Admitting: Internal Medicine

## 2013-07-16 ENCOUNTER — Telehealth: Payer: Self-pay | Admitting: *Deleted

## 2013-07-16 VITALS — BP 143/66 | HR 77 | Temp 97.9°F | Resp 16 | Ht 63.5 in | Wt 165.0 lb

## 2013-07-16 DIAGNOSIS — S98139A Complete traumatic amputation of one unspecified lesser toe, initial encounter: Secondary | ICD-10-CM | POA: Diagnosis not present

## 2013-07-16 DIAGNOSIS — Z452 Encounter for adjustment and management of vascular access device: Secondary | ICD-10-CM | POA: Diagnosis not present

## 2013-07-16 DIAGNOSIS — E1159 Type 2 diabetes mellitus with other circulatory complications: Secondary | ICD-10-CM | POA: Diagnosis not present

## 2013-07-16 DIAGNOSIS — I739 Peripheral vascular disease, unspecified: Secondary | ICD-10-CM

## 2013-07-16 DIAGNOSIS — I509 Heart failure, unspecified: Secondary | ICD-10-CM | POA: Diagnosis not present

## 2013-07-16 DIAGNOSIS — A4902 Methicillin resistant Staphylococcus aureus infection, unspecified site: Secondary | ICD-10-CM | POA: Diagnosis not present

## 2013-07-16 DIAGNOSIS — E1129 Type 2 diabetes mellitus with other diabetic kidney complication: Secondary | ICD-10-CM | POA: Diagnosis not present

## 2013-07-16 DIAGNOSIS — I96 Gangrene, not elsewhere classified: Secondary | ICD-10-CM | POA: Diagnosis not present

## 2013-07-16 NOTE — Progress Notes (Signed)
Patient is a 67 year old female who is status post atherectomy and angioplasty of her right superficial femoral artery on 05/27/2013. Later that day she underwent ray amputation of the right fifth toe. She had concerns that the wound on the lateral aspect of her foot was worsening and presented to the office today. She denies any fever or chills. She states that she does not see very well she does not really know if the wound looks different. Her home health nurse thought the wound had worsened.   Filed Vitals:   07/16/13 1602  BP: 143/66  Pulse: 77  Temp: 97.9 F (36.6 C)  TempSrc: Oral  Resp: 16  Height: 5' 3.5" (1.613 m)  Weight: 165 lb (74.844 kg)  SpO2: 98%    Right lower extremity: Right foot is pink and warm no pedal pulses palpable but her foot is fairly edematous, 10-15 flexion contracture right knee chronic, 4 x 1 cm x 2 mm depth wound lateral aspect right foot with good healthy-appearing granulation tissue at the base some serous drainage, first toe 2 cm ulceration at tip with erythema of toe which patient states is chronic  Assessment/Plan: Healing ray amputation right fifth toe currently by secondary intention, overall the wound is clean but certainly she is at risk of developing osteomyelitis. I do not believe the wound is actively infected currently. Wound care is changed to high she'll dressings once daily. The patient has followup RA schedule [name] next Tuesday. I did discuss with her the there is a possibility that this could require further debridement if she develops infection in the bone. However, currently this appears to be healing.  Fabienne Bruns, MD [facility] Office: [phone] Pager: [phone]

## 2013-07-16 NOTE — Telephone Encounter (Signed)
Ramiro Harvest , nurse with [facility] called stating that patient's wound/incision had opened and having a yellowish/greenish drainage. Her foot is red and swollen with slough present. The size is .5 cm deep, widest area is 1 cm and is 5.5 cm long.  I scheduled her to come to office to see Dr Darrick Penna today at 2:45.

## 2013-07-17 ENCOUNTER — Encounter: Payer: Self-pay | Admitting: Internal Medicine

## 2013-07-20 ENCOUNTER — Encounter: Payer: Self-pay | Admitting: Vascular Surgery

## 2013-07-20 ENCOUNTER — Telehealth: Payer: Self-pay | Admitting: Internal Medicine

## 2013-07-20 NOTE — Telephone Encounter (Signed)
New problem    Need an order to draw lab work .

## 2013-07-20 NOTE — Telephone Encounter (Signed)
Spoke with [name] - aware of Dr Tenny Craw' verbal order for repeat BMP on Tuesday

## 2013-07-21 ENCOUNTER — Ambulatory Visit (INDEPENDENT_AMBULATORY_CARE_PROVIDER_SITE_OTHER): Payer: Self-pay | Admitting: Vascular Surgery

## 2013-07-21 ENCOUNTER — Encounter: Payer: Self-pay | Admitting: Vascular Surgery

## 2013-07-21 ENCOUNTER — Ambulatory Visit (HOSPITAL_COMMUNITY)
Admission: RE | Admit: 2013-07-21 | Discharge: 2013-07-21 | Disposition: A | Discharge status: Home / Self Care | Payer: Medicare Other | Source: Ambulatory Visit | Attending: Vascular Surgery | Admitting: Vascular Surgery

## 2013-07-21 ENCOUNTER — Encounter: Payer: Self-pay | Admitting: Internal Medicine

## 2013-07-21 VITALS — BP 155/57 | HR 74 | Resp 18 | Ht 63.5 in | Wt 163.2 lb

## 2013-07-21 DIAGNOSIS — I739 Peripheral vascular disease, unspecified: Secondary | ICD-10-CM | POA: Insufficient documentation

## 2013-07-21 DIAGNOSIS — Z48812 Encounter for surgical aftercare following surgery on the circulatory system: Secondary | ICD-10-CM

## 2013-07-21 DIAGNOSIS — I96 Gangrene, not elsewhere classified: Secondary | ICD-10-CM | POA: Diagnosis not present

## 2013-07-21 DIAGNOSIS — A4902 Methicillin resistant Staphylococcus aureus infection, unspecified site: Secondary | ICD-10-CM | POA: Diagnosis not present

## 2013-07-21 DIAGNOSIS — Z452 Encounter for adjustment and management of vascular access device: Secondary | ICD-10-CM | POA: Diagnosis not present

## 2013-07-21 DIAGNOSIS — E1159 Type 2 diabetes mellitus with other circulatory complications: Secondary | ICD-10-CM | POA: Diagnosis not present

## 2013-07-21 DIAGNOSIS — I509 Heart failure, unspecified: Secondary | ICD-10-CM | POA: Diagnosis not present

## 2013-07-21 DIAGNOSIS — S98139A Complete traumatic amputation of one unspecified lesser toe, initial encounter: Secondary | ICD-10-CM | POA: Diagnosis not present

## 2013-07-21 DIAGNOSIS — E1129 Type 2 diabetes mellitus with other diabetic kidney complication: Secondary | ICD-10-CM | POA: Diagnosis not present

## 2013-07-21 NOTE — Progress Notes (Signed)
History is a for followup of her patient of her right fifth toe on 813. She had undergone superficial femoral artery atherectomy in her antidepressant by Dr. Myra Gianotti during the same admission. She does have continued slow healing. The area of her rate dictation is opened over approximately 3 mm in the lower portion of the incision of approximately 5 mm in the upper portion of the incision. There is no surrounding erythema. She will continue local wound care with this.  Her main concern today is of marked lower extremity edema. She is having increasing use of Lasix does have some renal insufficiency as well.  He does have palpable left femoral to anterior tibial graft pulse well-perfused left foot.  Noninvasive studies today reveal patency to her superficial femoral artery with an elevated velocities at femoral or   Stable continued healing. She will continue local wound care to see her again in one month. To notify should she develop any difficulties in the interim

## 2013-07-22 ENCOUNTER — Telehealth: Payer: Self-pay | Admitting: *Deleted

## 2013-07-22 ENCOUNTER — Ambulatory Visit: Payer: Medicare Other | Admitting: Infectious Disease

## 2013-07-22 DIAGNOSIS — S98139A Complete traumatic amputation of one unspecified lesser toe, initial encounter: Secondary | ICD-10-CM | POA: Diagnosis not present

## 2013-07-22 DIAGNOSIS — Z452 Encounter for adjustment and management of vascular access device: Secondary | ICD-10-CM | POA: Diagnosis not present

## 2013-07-22 DIAGNOSIS — A4902 Methicillin resistant Staphylococcus aureus infection, unspecified site: Secondary | ICD-10-CM | POA: Diagnosis not present

## 2013-07-22 DIAGNOSIS — E1129 Type 2 diabetes mellitus with other diabetic kidney complication: Secondary | ICD-10-CM | POA: Diagnosis not present

## 2013-07-22 DIAGNOSIS — E1159 Type 2 diabetes mellitus with other circulatory complications: Secondary | ICD-10-CM | POA: Diagnosis not present

## 2013-07-22 DIAGNOSIS — I96 Gangrene, not elsewhere classified: Secondary | ICD-10-CM | POA: Diagnosis not present

## 2013-07-22 NOTE — Telephone Encounter (Signed)
Done

## 2013-07-22 NOTE — Telephone Encounter (Signed)
Noted. Pt needs appt with pcp mid nov. Will cc front desk

## 2013-07-22 NOTE — Telephone Encounter (Signed)
Call from [place], RN with [facility] - # [phone]   Nurse reports labs drawn at patients home yesterday at 3:30. Results  Glucose 452 and Potassium 4.5       Last labs in chart from 10/2  Show cbg 311 and potassium  4.6    Pt called and she reports cgb this morning was 152, she had just eaten "the wrong stuff" yesterday.

## 2013-07-22 NOTE — Telephone Encounter (Signed)
I think her K 4.5 and morning CBG 152 are fine. Will not change her current regimen.   [name], MD PGY3, Internal Medicine Teaching Service Pager: [phone]

## 2013-07-23 ENCOUNTER — Ambulatory Visit (INDEPENDENT_AMBULATORY_CARE_PROVIDER_SITE_OTHER): Payer: Medicare Other | Admitting: Internal Medicine

## 2013-07-23 ENCOUNTER — Encounter (INDEPENDENT_AMBULATORY_CARE_PROVIDER_SITE_OTHER): Payer: Self-pay

## 2013-07-23 VITALS — BP 126/64 | HR 74 | Ht 63.0 in | Wt 162.0 lb

## 2013-07-23 DIAGNOSIS — I96 Gangrene, not elsewhere classified: Secondary | ICD-10-CM | POA: Diagnosis not present

## 2013-07-23 DIAGNOSIS — E1129 Type 2 diabetes mellitus with other diabetic kidney complication: Secondary | ICD-10-CM | POA: Diagnosis not present

## 2013-07-23 DIAGNOSIS — S98139A Complete traumatic amputation of one unspecified lesser toe, initial encounter: Secondary | ICD-10-CM | POA: Diagnosis not present

## 2013-07-23 DIAGNOSIS — I509 Heart failure, unspecified: Secondary | ICD-10-CM

## 2013-07-23 DIAGNOSIS — I25810 Atherosclerosis of coronary artery bypass graft(s) without angina pectoris: Secondary | ICD-10-CM | POA: Diagnosis not present

## 2013-07-23 DIAGNOSIS — E785 Hyperlipidemia, unspecified: Secondary | ICD-10-CM

## 2013-07-23 DIAGNOSIS — I739 Peripheral vascular disease, unspecified: Secondary | ICD-10-CM | POA: Diagnosis not present

## 2013-07-23 DIAGNOSIS — I5022 Chronic systolic (congestive) heart failure: Secondary | ICD-10-CM

## 2013-07-23 DIAGNOSIS — M3390 Dermatopolymyositis, unspecified, organ involvement unspecified: Secondary | ICD-10-CM

## 2013-07-23 DIAGNOSIS — A4902 Methicillin resistant Staphylococcus aureus infection, unspecified site: Secondary | ICD-10-CM | POA: Diagnosis not present

## 2013-07-23 DIAGNOSIS — Z452 Encounter for adjustment and management of vascular access device: Secondary | ICD-10-CM | POA: Diagnosis not present

## 2013-07-23 DIAGNOSIS — E1159 Type 2 diabetes mellitus with other circulatory complications: Secondary | ICD-10-CM | POA: Diagnosis not present

## 2013-07-23 MED ORDER — METOPROLOL SUCCINATE ER 25 MG PO TB24: 25.0000 mg | ORAL_TABLET | Freq: Every day | ORAL | Status: DC

## 2013-07-23 NOTE — Patient Instructions (Signed)
Your physician recommends that you continue on your current medications as directed. Please refer to the Current Medication list given to you today.  Your physician recommends that you schedule a follow-up appointment in:  Late December with [name].

## 2013-07-23 NOTE — Progress Notes (Signed)
HPI Patient is a 67 yo with  a history of CAD (s/p CABG- cath in 09/2011 showed patent LIMA to LAD with occlusion of distal LAD; High grade stenosis of nondominant RCA; Signif disease of prox LCx; SVG to L PDA occluded, SVG to OM patent), Aortic stenosis, DM, dyslipidemia, POVD,   Echo 05/2013: Mild LVH, EF 30%, RVEF depressed;  mod aortic stenosis)  The patient was recently hospitalized for amputation of R toe and revasc of leg.   I saw her in clinic recently  Had signif edema in thighs  Distraught about this   Labs drawn  Lasix adjusted  Taking 80 alternating with 60 mg  She says she has lost about 5 lbs.    Allergies  Allergen Reactions  . Adhesive [Tape] Other (See Comments)    "old [employer] adhesive tape; takes my skin off; can use paper tape"  . Zyvox [Linezolid]     Thrombocytopenia developed to 50k after several weeks of therapy  . Bactrim [Sulfamethoxazole-Trimethoprim]     Hyperkalemia and Increased creatinine  . Cephalexin Swelling and Rash  . Ciprofloxacin Swelling and Rash  . Daptomycin     Had elevated CPK on therapy, not clear that this was due to cubicin  . Vancomycin     ? If this played role in her Acute kidney injury    Current Outpatient Prescriptions  Medication Sig Dispense Refill  . acetaminophen (TYLENOL) 325 MG tablet Take 650 mg by mouth every 6 (six) hours as needed for fever.      [name] aspirin EC 325 MG EC tablet Take 325 mg by mouth daily.       [name] atorvastatin (LIPITOR) 40 MG tablet Take 40 mg by mouth at bedtime.      . cyanocobalamin (,VITAMIN B-12,) 1000 MCG/ML injection Inject 1,000 mcg into the muscle every 30 (thirty) days.      . cyanocobalamin (,VITAMIN B-12,) 1000 MCG/ML injection Inject 1 mL (1,000 mcg total) into the muscle once.  1 mL  0  . docusate sodium (COLACE) 50 MG capsule Take by mouth 2 (two) times daily.      . Ferrous Sulfate (SLOW RELEASE IRON) 50 MG TBCR Take 50 mg by mouth daily.       . furosemide (LASIX) 20 MG tablet Take 80 mg  by mouth daily. 1 AND 1/2 TABLET (60 MG) ALTERNATING WITH 1 TABLET (40 MG)      . glucose blood test strip Use as instructed  100 each  11  . insulin glargine (LANTUS) 100 UNIT/ML injection Inject 0.04 mL (4 Units total) into the skin 2 (two) times daily.  10 mL  12  . Insulin Lispro, Human, (HUMALOG [place]) Inject into the skin. Sliding scale      . isosorbide mononitrate (IMDUR) 30 MG 24 hr tablet Take 30 mg by mouth at bedtime.       [name] levothyroxine (SYNTHROID, LEVOTHROID) 125 MCG tablet Take 1 tablet (125 mcg total) by mouth daily.  30 tablet  3  . metoprolol succinate (TOPROL-XL) 25 MG 24 hr tablet Take 25 mg by mouth daily.       No current facility-administered medications for this visit.    Past Medical History  Diagnosis Date  . HTN (hypertension)   . Hypothyroidism   . History of recurrent UTIs   . CAD (coronary artery disease)     s/p CABG x3 in 1993; last cath in 2010  . Scleroderma   . Bilateral carpal tunnel syndrome  1970s  . 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 Followed by [name]  . Claudication   . Aortic stenosis     0.8cm2 on echo in 10/2011  . Hyperlipidemia     on Lipitor  . Cat bite 2009    with MRSA; required I & D  . Heart murmur   . Blood transfusion     no reaction from transfusion; "when I had heart surgery" (08/28/2012)  . GERD (gastroesophageal reflux disease)   . CHF (congestive heart failure) Aug. 2012    Echo 10/2011: Mild LVH, EF 30%, apex akinetic, septal HK, grade 2 diastolic dysfunction, or functionally bicuspid aortic valve, mild aortic stenosis by gradient, severe by calculated AVA-suspect A. Aortic stenosis probably moderate, trivial MR, mild LAE, mild RAE.   [name] Cellulitis   . Anemia   . B12 deficiency anemia     B 12 injection "monthly since 05/2012" (08/28/2012)  . Iron deficiency anemia 06/13/2012    "iron infusion" (08/28/2012  . Anginal pain     "history; not currently"  (08/28/2012)  . Myocardial infarction 1986    "silent"  . History of bronchitis     "I've had it twice in my life" (08/28/2012)  . Exertional dyspnea     "because of the heart failure" (08/28/2012)  . Type 1 diabetes 10/1949  . Stomach ulcer 1981?    "on Tagament for ~ 1 yr" (08/28/2012)  . Seizure     ? seizure like event in 2010; workup included stress testing which led to repeat cath.   . Arthritis     "hands, hips, all over" (08/28/2012  . Osteoarthritis of both feet   . Breast cancer     left  . Cellulitis June 2013, Nov. 2013 and Feb. 14, 2014    Left leg    Past Surgical History  Procedure Laterality Date  . Carpal tunnel release  ~ 1970's    bilaterally  . Mastectomy  11/1982    Left Breast  . Vitrectomy  1990's-2004    "both eyes; I've had total of 4"  . Tubal ligation  1984  . Tonsillectomy  1952    "?adenoids"   . Appendectomy  1991  . Femoral bypass      left leg "related to transmetatarsal amputation" (08/28/2012)  . Breast biopsy with sentinel lymph node biopsy and needle localization  1984  . Coronary artery bypass graft  1992    LIMA to LAD, SVG to distal LCX & SVG to Marginal  . Cardiac catheterization  2010    LIMA to LAD patent yet with occluded distal LAD after graft insertion & retrograde is occluded. 3-4 septal perforators arise &are patent. SVG to a large marginal patent; SVG presumably to the distal dominant LCX is occluded, moderately high grade stenosis of the AV circumflex, but with distal stenoses involving a severely diseased marginal branch not amenable  to PCI  nor is inferior branch   . Cardiac catheterization  10/2011    "unsuccessful attempt of stenting" (08/28/2012)  . Cardiac catheterization  1992; 09/2011  . Breast biopsy    . Trigger finger release  1980's    right thumb  . Incision and drainage of wound  ~ 1967    "infection in left foot" (08/28/2012)  . Incision and drainage of wound  2009    "3 ORs on my right hand after cat bite"  (  08/28/2012)  . 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 Cataract extraction w/ intraocular lens  implant, bilateral  1990's  . Amputation Right 05/27/2013    Procedure: AMPUTATION DIGIT FIFTH TOE;  Surgeon: Larina Earthly, MD;  Location: [facility] OR;  Service: Vascular;  Laterality: Right;    Family History  Problem Relation Age of Onset  . Diabetes Mother     History   Social History  . Marital Status: Single    Spouse Name: N/A    Number of Children: N/A  . Years of Education: N/A   Occupational History  . Environmental health practitioner    Social History Main Topics  . Smoking status: Never Smoker   . Smokeless tobacco: Never Used  . Alcohol Use: No  . Drug Use: No  . Sexual Activity: Not Currently    Birth Control/ Protection: Post-menopausal   Other Topics Concern  . Not on file   Social History Narrative  . No narrative on file    Review of Systems:  All systems reviewed.  They are negative to the above problem except as previously stated.  Vital Signs: BP 126/64  Pulse 74  Ht 5\' 3"  (1.6 m)  Wt 162 lb (73.483 kg)  BMI 28.7 kg/m2  Physical Exam Patient is in NAD HEENT:  Normocephalic, atraumatic. EOMI, PERRLA.  Neck: JVP is normal   Lungs: clear to auscultation. No rales no wheezes.  Heart: Regular rate and rhythm. Normal S1, S2. No S3.  III/VI systolic murmr  Abdomen:  Supple, nontender. Normal bowel sounds. No masses. No hepatomegaly. Back:  ++ edema    Extremities:  1- 2+ edema worse in thighs  R foot wrapped   Musculoskeletal :moving all extremities.  Neuro:   alert and oriented x3.  CN II-XII grossly intact.   Assessment and Plan: 1.  Acute on chronic systolic CHF.  The patient now is taking lasix 80 alternating with 60 qd  Slowly improving  Still with signif edema in thigh. Labs from this wk oK   I would continue  When she takes lasix she should elevate legs.  Follow up labs next wk.   2.  CAD  No symptoms to suggest angina.     2.  HTN Adequate control  3.  HL  Continue meds.  Need to check on Zetia which was d/c'd while in hospital.  4.  PVOD  Just seen by T Early  Wound appears to be healing well.    5.  AS  Follow

## 2013-07-24 DIAGNOSIS — I96 Gangrene, not elsewhere classified: Secondary | ICD-10-CM | POA: Diagnosis not present

## 2013-07-24 DIAGNOSIS — E1159 Type 2 diabetes mellitus with other circulatory complications: Secondary | ICD-10-CM | POA: Diagnosis not present

## 2013-07-24 DIAGNOSIS — Z452 Encounter for adjustment and management of vascular access device: Secondary | ICD-10-CM | POA: Diagnosis not present

## 2013-07-24 DIAGNOSIS — A4902 Methicillin resistant Staphylococcus aureus infection, unspecified site: Secondary | ICD-10-CM | POA: Diagnosis not present

## 2013-07-24 DIAGNOSIS — S98139A Complete traumatic amputation of one unspecified lesser toe, initial encounter: Secondary | ICD-10-CM | POA: Diagnosis not present

## 2013-07-24 DIAGNOSIS — E1129 Type 2 diabetes mellitus with other diabetic kidney complication: Secondary | ICD-10-CM | POA: Diagnosis not present

## 2013-07-27 ENCOUNTER — Telehealth: Payer: Self-pay | Admitting: Internal Medicine

## 2013-07-27 NOTE — Telephone Encounter (Signed)
New message   Need order for labs----

## 2013-07-27 NOTE — Telephone Encounter (Signed)
Spoke with chrsi, order given for bmp

## 2013-07-28 DIAGNOSIS — S98139A Complete traumatic amputation of one unspecified lesser toe, initial encounter: Secondary | ICD-10-CM | POA: Diagnosis not present

## 2013-07-28 DIAGNOSIS — I96 Gangrene, not elsewhere classified: Secondary | ICD-10-CM | POA: Diagnosis not present

## 2013-07-28 DIAGNOSIS — Z452 Encounter for adjustment and management of vascular access device: Secondary | ICD-10-CM | POA: Diagnosis not present

## 2013-07-28 DIAGNOSIS — A4902 Methicillin resistant Staphylococcus aureus infection, unspecified site: Secondary | ICD-10-CM | POA: Diagnosis not present

## 2013-07-28 DIAGNOSIS — E1129 Type 2 diabetes mellitus with other diabetic kidney complication: Secondary | ICD-10-CM | POA: Diagnosis not present

## 2013-07-28 DIAGNOSIS — E1159 Type 2 diabetes mellitus with other circulatory complications: Secondary | ICD-10-CM | POA: Diagnosis not present

## 2013-07-29 DIAGNOSIS — Z452 Encounter for adjustment and management of vascular access device: Secondary | ICD-10-CM | POA: Diagnosis not present

## 2013-07-29 DIAGNOSIS — I96 Gangrene, not elsewhere classified: Secondary | ICD-10-CM | POA: Diagnosis not present

## 2013-07-29 DIAGNOSIS — A4902 Methicillin resistant Staphylococcus aureus infection, unspecified site: Secondary | ICD-10-CM | POA: Diagnosis not present

## 2013-07-29 DIAGNOSIS — E1129 Type 2 diabetes mellitus with other diabetic kidney complication: Secondary | ICD-10-CM | POA: Diagnosis not present

## 2013-07-29 DIAGNOSIS — S98139A Complete traumatic amputation of one unspecified lesser toe, initial encounter: Secondary | ICD-10-CM | POA: Diagnosis not present

## 2013-07-29 DIAGNOSIS — E1159 Type 2 diabetes mellitus with other circulatory complications: Secondary | ICD-10-CM | POA: Diagnosis not present

## 2013-07-30 ENCOUNTER — Encounter: Payer: Self-pay | Admitting: Internal Medicine

## 2013-07-30 ENCOUNTER — Telehealth: Payer: Self-pay | Admitting: Internal Medicine

## 2013-07-30 ENCOUNTER — Telehealth: Payer: Self-pay | Admitting: *Deleted

## 2013-07-30 DIAGNOSIS — E1129 Type 2 diabetes mellitus with other diabetic kidney complication: Secondary | ICD-10-CM | POA: Diagnosis not present

## 2013-07-30 DIAGNOSIS — I509 Heart failure, unspecified: Secondary | ICD-10-CM | POA: Diagnosis not present

## 2013-07-30 DIAGNOSIS — Z452 Encounter for adjustment and management of vascular access device: Secondary | ICD-10-CM | POA: Diagnosis not present

## 2013-07-30 DIAGNOSIS — S98139A Complete traumatic amputation of one unspecified lesser toe, initial encounter: Secondary | ICD-10-CM | POA: Diagnosis not present

## 2013-07-30 DIAGNOSIS — E1159 Type 2 diabetes mellitus with other circulatory complications: Secondary | ICD-10-CM | POA: Diagnosis not present

## 2013-07-30 DIAGNOSIS — A4902 Methicillin resistant Staphylococcus aureus infection, unspecified site: Secondary | ICD-10-CM | POA: Diagnosis not present

## 2013-07-30 DIAGNOSIS — I96 Gangrene, not elsewhere classified: Secondary | ICD-10-CM | POA: Diagnosis not present

## 2013-07-30 NOTE — Telephone Encounter (Signed)
Call from [place], RN with [facility] [phone] Nurse is asking for continuation of care, she wants to recertify pt  for medication teaching, and wound care.  Will this be okay with you? Nurse also reports elevated CBG's for past several days. Fasting cbg today was 335.   Pt was d/c from hospital on Lantus 4 units twice a day but she  put her self back on lantus 20 units in AM and 10 units in PM. She also is on Humalog sliding scale.  Please advise

## 2013-07-30 NOTE — Telephone Encounter (Signed)
Left message for jessi, order given for bmp this week

## 2013-07-30 NOTE — Telephone Encounter (Signed)
New problem    Want to know if Dr Tenny Craw want pt to have labs done because of her Lasix. Please advise.

## 2013-07-30 NOTE — Telephone Encounter (Signed)
Spoke with chris, okay given for labs to be drawn first of next week.

## 2013-07-30 NOTE — Telephone Encounter (Signed)
I am fine with nurse's plan to recertify pt for medication teaching, and wound care because her medical issues are very complicated.  It is OK that patient goes back to her regimen to take lantus 20 units in AM and 10 units in PM, and continue Humalog sliding scale.  [name], MD PGY3, Internal Medicine Teaching Service Pager: [phone]

## 2013-07-30 NOTE — Telephone Encounter (Signed)
New Problem:  Mrs. [name] states [facility] received an order for labs to be drawn. However, the lab order did not reach [facility] until after the nurses visit for this week. Does [name] want [facility] to make another visit just for labs this week or can the labs wait until next weeks visit. Please advise

## 2013-07-31 NOTE — Telephone Encounter (Signed)
Nurse informed

## 2013-08-01 DIAGNOSIS — Z4789 Encounter for other orthopedic aftercare: Secondary | ICD-10-CM | POA: Diagnosis not present

## 2013-08-01 DIAGNOSIS — I739 Peripheral vascular disease, unspecified: Secondary | ICD-10-CM | POA: Diagnosis not present

## 2013-08-01 DIAGNOSIS — D518 Other vitamin B12 deficiency anemias: Secondary | ICD-10-CM | POA: Diagnosis not present

## 2013-08-01 DIAGNOSIS — I129 Hypertensive chronic kidney disease with stage 1 through stage 4 chronic kidney disease, or unspecified chronic kidney disease: Secondary | ICD-10-CM | POA: Diagnosis not present

## 2013-08-01 DIAGNOSIS — E1129 Type 2 diabetes mellitus with other diabetic kidney complication: Secondary | ICD-10-CM | POA: Diagnosis not present

## 2013-08-01 DIAGNOSIS — I509 Heart failure, unspecified: Secondary | ICD-10-CM | POA: Diagnosis not present

## 2013-08-01 DIAGNOSIS — S98139A Complete traumatic amputation of one unspecified lesser toe, initial encounter: Secondary | ICD-10-CM | POA: Diagnosis not present

## 2013-08-01 DIAGNOSIS — N189 Chronic kidney disease, unspecified: Secondary | ICD-10-CM | POA: Diagnosis not present

## 2013-08-03 ENCOUNTER — Ambulatory Visit (HOSPITAL_BASED_OUTPATIENT_CLINIC_OR_DEPARTMENT_OTHER): Payer: Medicare Other

## 2013-08-03 ENCOUNTER — Telehealth: Payer: Self-pay | Admitting: Oncology

## 2013-08-03 VITALS — BP 118/31 | HR 67 | Temp 97.6°F

## 2013-08-03 DIAGNOSIS — I509 Heart failure, unspecified: Secondary | ICD-10-CM | POA: Diagnosis not present

## 2013-08-03 DIAGNOSIS — N189 Chronic kidney disease, unspecified: Secondary | ICD-10-CM | POA: Diagnosis not present

## 2013-08-03 DIAGNOSIS — E538 Deficiency of other specified B group vitamins: Secondary | ICD-10-CM | POA: Diagnosis not present

## 2013-08-03 DIAGNOSIS — I739 Peripheral vascular disease, unspecified: Secondary | ICD-10-CM | POA: Diagnosis not present

## 2013-08-03 DIAGNOSIS — I129 Hypertensive chronic kidney disease with stage 1 through stage 4 chronic kidney disease, or unspecified chronic kidney disease: Secondary | ICD-10-CM | POA: Diagnosis not present

## 2013-08-03 DIAGNOSIS — D518 Other vitamin B12 deficiency anemias: Secondary | ICD-10-CM

## 2013-08-03 DIAGNOSIS — Z4789 Encounter for other orthopedic aftercare: Secondary | ICD-10-CM | POA: Diagnosis not present

## 2013-08-03 DIAGNOSIS — E1129 Type 2 diabetes mellitus with other diabetic kidney complication: Secondary | ICD-10-CM | POA: Diagnosis not present

## 2013-08-03 MED ORDER — CYANOCOBALAMIN 1000 MCG/ML IJ SOLN
1000.0000 ug | Freq: Once | INTRAMUSCULAR | Status: AC
  Administered 2013-08-03: 1000 ug via INTRAMUSCULAR

## 2013-08-04 ENCOUNTER — Ambulatory Visit: Payer: Medicare Other | Admitting: Vascular Surgery

## 2013-08-04 DIAGNOSIS — N189 Chronic kidney disease, unspecified: Secondary | ICD-10-CM | POA: Diagnosis not present

## 2013-08-04 DIAGNOSIS — E1129 Type 2 diabetes mellitus with other diabetic kidney complication: Secondary | ICD-10-CM | POA: Diagnosis not present

## 2013-08-04 DIAGNOSIS — I739 Peripheral vascular disease, unspecified: Secondary | ICD-10-CM | POA: Diagnosis not present

## 2013-08-04 DIAGNOSIS — I129 Hypertensive chronic kidney disease with stage 1 through stage 4 chronic kidney disease, or unspecified chronic kidney disease: Secondary | ICD-10-CM | POA: Diagnosis not present

## 2013-08-04 DIAGNOSIS — Z4789 Encounter for other orthopedic aftercare: Secondary | ICD-10-CM | POA: Diagnosis not present

## 2013-08-04 DIAGNOSIS — I509 Heart failure, unspecified: Secondary | ICD-10-CM | POA: Diagnosis not present

## 2013-08-05 ENCOUNTER — Encounter: Payer: Self-pay | Admitting: Vascular Surgery

## 2013-08-05 DIAGNOSIS — Z4789 Encounter for other orthopedic aftercare: Secondary | ICD-10-CM | POA: Diagnosis not present

## 2013-08-05 DIAGNOSIS — I129 Hypertensive chronic kidney disease with stage 1 through stage 4 chronic kidney disease, or unspecified chronic kidney disease: Secondary | ICD-10-CM | POA: Diagnosis not present

## 2013-08-05 DIAGNOSIS — I509 Heart failure, unspecified: Secondary | ICD-10-CM | POA: Diagnosis not present

## 2013-08-05 DIAGNOSIS — E1129 Type 2 diabetes mellitus with other diabetic kidney complication: Secondary | ICD-10-CM | POA: Diagnosis not present

## 2013-08-05 DIAGNOSIS — N189 Chronic kidney disease, unspecified: Secondary | ICD-10-CM | POA: Diagnosis not present

## 2013-08-05 DIAGNOSIS — I739 Peripheral vascular disease, unspecified: Secondary | ICD-10-CM | POA: Diagnosis not present

## 2013-08-06 ENCOUNTER — Encounter: Payer: Self-pay | Admitting: Vascular Surgery

## 2013-08-06 ENCOUNTER — Ambulatory Visit (INDEPENDENT_AMBULATORY_CARE_PROVIDER_SITE_OTHER): Payer: Medicare Other | Admitting: Vascular Surgery

## 2013-08-06 VITALS — BP 138/44 | HR 63 | Temp 97.6°F | Ht 63.0 in | Wt 160.8 lb

## 2013-08-06 DIAGNOSIS — Z4789 Encounter for other orthopedic aftercare: Secondary | ICD-10-CM | POA: Diagnosis not present

## 2013-08-06 DIAGNOSIS — N189 Chronic kidney disease, unspecified: Secondary | ICD-10-CM | POA: Diagnosis not present

## 2013-08-06 DIAGNOSIS — I739 Peripheral vascular disease, unspecified: Secondary | ICD-10-CM | POA: Diagnosis not present

## 2013-08-06 DIAGNOSIS — I129 Hypertensive chronic kidney disease with stage 1 through stage 4 chronic kidney disease, or unspecified chronic kidney disease: Secondary | ICD-10-CM | POA: Diagnosis not present

## 2013-08-06 DIAGNOSIS — E1129 Type 2 diabetes mellitus with other diabetic kidney complication: Secondary | ICD-10-CM | POA: Diagnosis not present

## 2013-08-06 DIAGNOSIS — I509 Heart failure, unspecified: Secondary | ICD-10-CM | POA: Diagnosis not present

## 2013-08-06 NOTE — Progress Notes (Signed)
  VASCULAR SURGERY  PROGRESS NOTE   SUBJECTIVE: Pt had 5th toe ray amp by [name] in August of 2014. She has a slowly healing wound. The distal portion has some fibrinous exudative tissue. Otherwise healing well  PHYSICAL EXAM: BP Readings from Last 3 Encounters:  08/06/13 138/44  08/03/13 118/31  07/23/13 126/64   Temp Readings from Last 3 Encounters:  08/06/13 97.6 F (36.4 C) Oral  08/03/13 97.6 F (36.4 C) Oral  07/16/13 97.9 F (36.6 C) Oral   Pulse Readings from Last 3 Encounters:  08/06/13 63  08/03/13 67  07/23/13 74   SpO2 Readings from Last 3 Encounters:  08/06/13 100%  07/16/13 98%  07/03/13 98%    PE: Right foot stable with dry ulcer on great toe, small open wound on dorsum of foot Lateral incision from 5th toe ray amp is healing well except most distal wound which was debrided by Dr Darrick Penna to bleeding. Dressing re-applied  ASSESSMENT: Healing right foot 5th ray amp, debrided today  PLAN:F/U with Dr early on 08/25/13 Continue wound care daily  Clinic MD; [facility]  History and exam as above.  I did superficial debridement of fibrinous exudate at the lateral aspect of her foot today back to healthy bleeding tissue  Fabienne Bruns, MD [facility] Office: [phone] Pager: [phone]

## 2013-08-07 ENCOUNTER — Telehealth: Payer: Self-pay | Admitting: Internal Medicine

## 2013-08-07 DIAGNOSIS — I739 Peripheral vascular disease, unspecified: Secondary | ICD-10-CM | POA: Diagnosis not present

## 2013-08-07 DIAGNOSIS — N189 Chronic kidney disease, unspecified: Secondary | ICD-10-CM | POA: Diagnosis not present

## 2013-08-07 DIAGNOSIS — Z4789 Encounter for other orthopedic aftercare: Secondary | ICD-10-CM | POA: Diagnosis not present

## 2013-08-07 DIAGNOSIS — I509 Heart failure, unspecified: Secondary | ICD-10-CM | POA: Diagnosis not present

## 2013-08-07 DIAGNOSIS — I129 Hypertensive chronic kidney disease with stage 1 through stage 4 chronic kidney disease, or unspecified chronic kidney disease: Secondary | ICD-10-CM | POA: Diagnosis not present

## 2013-08-07 DIAGNOSIS — E1129 Type 2 diabetes mellitus with other diabetic kidney complication: Secondary | ICD-10-CM | POA: Diagnosis not present

## 2013-08-07 NOTE — Telephone Encounter (Signed)
New message    Returning Debra's call from yesterday--Need lab results--home health nurse is there now and they wanted to get the results

## 2013-08-07 NOTE — Telephone Encounter (Signed)
Spoke with pt, aware of labs 

## 2013-08-10 DIAGNOSIS — N189 Chronic kidney disease, unspecified: Secondary | ICD-10-CM | POA: Diagnosis not present

## 2013-08-10 DIAGNOSIS — I129 Hypertensive chronic kidney disease with stage 1 through stage 4 chronic kidney disease, or unspecified chronic kidney disease: Secondary | ICD-10-CM | POA: Diagnosis not present

## 2013-08-10 DIAGNOSIS — I509 Heart failure, unspecified: Secondary | ICD-10-CM | POA: Diagnosis not present

## 2013-08-10 DIAGNOSIS — Z4789 Encounter for other orthopedic aftercare: Secondary | ICD-10-CM | POA: Diagnosis not present

## 2013-08-10 DIAGNOSIS — E1129 Type 2 diabetes mellitus with other diabetic kidney complication: Secondary | ICD-10-CM | POA: Diagnosis not present

## 2013-08-10 DIAGNOSIS — I739 Peripheral vascular disease, unspecified: Secondary | ICD-10-CM | POA: Diagnosis not present

## 2013-08-11 DIAGNOSIS — E1129 Type 2 diabetes mellitus with other diabetic kidney complication: Secondary | ICD-10-CM | POA: Diagnosis not present

## 2013-08-11 DIAGNOSIS — Z4789 Encounter for other orthopedic aftercare: Secondary | ICD-10-CM | POA: Diagnosis not present

## 2013-08-11 DIAGNOSIS — I129 Hypertensive chronic kidney disease with stage 1 through stage 4 chronic kidney disease, or unspecified chronic kidney disease: Secondary | ICD-10-CM | POA: Diagnosis not present

## 2013-08-11 DIAGNOSIS — I739 Peripheral vascular disease, unspecified: Secondary | ICD-10-CM | POA: Diagnosis not present

## 2013-08-11 DIAGNOSIS — N189 Chronic kidney disease, unspecified: Secondary | ICD-10-CM | POA: Diagnosis not present

## 2013-08-11 DIAGNOSIS — I509 Heart failure, unspecified: Secondary | ICD-10-CM | POA: Diagnosis not present

## 2013-08-12 DIAGNOSIS — I509 Heart failure, unspecified: Secondary | ICD-10-CM | POA: Diagnosis not present

## 2013-08-12 DIAGNOSIS — I129 Hypertensive chronic kidney disease with stage 1 through stage 4 chronic kidney disease, or unspecified chronic kidney disease: Secondary | ICD-10-CM | POA: Diagnosis not present

## 2013-08-12 DIAGNOSIS — E1129 Type 2 diabetes mellitus with other diabetic kidney complication: Secondary | ICD-10-CM | POA: Diagnosis not present

## 2013-08-12 DIAGNOSIS — N189 Chronic kidney disease, unspecified: Secondary | ICD-10-CM | POA: Diagnosis not present

## 2013-08-12 DIAGNOSIS — Z4789 Encounter for other orthopedic aftercare: Secondary | ICD-10-CM | POA: Diagnosis not present

## 2013-08-12 DIAGNOSIS — I739 Peripheral vascular disease, unspecified: Secondary | ICD-10-CM | POA: Diagnosis not present

## 2013-08-13 ENCOUNTER — Telehealth: Payer: Self-pay | Admitting: *Deleted

## 2013-08-13 NOTE — Telephone Encounter (Signed)
PT ASSIST CALLED BECAUSE SHE HAD TRIED TO SEE PT TODAY BUT PT REFUSED  PHYSICAL THERAPY TODAY AND HE REFUSED BECAUSE SHE SAID DID NOT FEEL WELL TODAY, BECAUSE PT HAD GAINED 5 LBS IN 2 TO 3 DAYS, AND C/O OF SOB. Pt STATES THAT THE WEIGHT MAY NOT BE ACCURATE BECAUSE PT HAD A LOT OF CLOTHES ON COMPARED FROM THE LAST TIME. PTA STATES THE [facility] NURSE IS SCHEDULED TO SEE PT TODAY  SO . I  ASK THE PTA  TO LETT THE  NURSE  TO CALL THE OFFICE AFTER SHE  ASSESS PT  WITH  UPDATE.

## 2013-08-14 DIAGNOSIS — I509 Heart failure, unspecified: Secondary | ICD-10-CM | POA: Diagnosis not present

## 2013-08-14 DIAGNOSIS — E1129 Type 2 diabetes mellitus with other diabetic kidney complication: Secondary | ICD-10-CM | POA: Diagnosis not present

## 2013-08-14 DIAGNOSIS — Z4789 Encounter for other orthopedic aftercare: Secondary | ICD-10-CM | POA: Diagnosis not present

## 2013-08-14 DIAGNOSIS — I129 Hypertensive chronic kidney disease with stage 1 through stage 4 chronic kidney disease, or unspecified chronic kidney disease: Secondary | ICD-10-CM | POA: Diagnosis not present

## 2013-08-14 DIAGNOSIS — I739 Peripheral vascular disease, unspecified: Secondary | ICD-10-CM | POA: Diagnosis not present

## 2013-08-14 DIAGNOSIS — N189 Chronic kidney disease, unspecified: Secondary | ICD-10-CM | POA: Diagnosis not present

## 2013-08-17 ENCOUNTER — Telehealth: Payer: Self-pay | Admitting: Internal Medicine

## 2013-08-17 DIAGNOSIS — I509 Heart failure, unspecified: Secondary | ICD-10-CM | POA: Diagnosis not present

## 2013-08-17 DIAGNOSIS — N189 Chronic kidney disease, unspecified: Secondary | ICD-10-CM | POA: Diagnosis not present

## 2013-08-17 DIAGNOSIS — Z4789 Encounter for other orthopedic aftercare: Secondary | ICD-10-CM | POA: Diagnosis not present

## 2013-08-17 DIAGNOSIS — I739 Peripheral vascular disease, unspecified: Secondary | ICD-10-CM | POA: Diagnosis not present

## 2013-08-17 DIAGNOSIS — I129 Hypertensive chronic kidney disease with stage 1 through stage 4 chronic kidney disease, or unspecified chronic kidney disease: Secondary | ICD-10-CM | POA: Diagnosis not present

## 2013-08-17 DIAGNOSIS — E1129 Type 2 diabetes mellitus with other diabetic kidney complication: Secondary | ICD-10-CM | POA: Diagnosis not present

## 2013-08-17 NOTE — Telephone Encounter (Signed)
Patient was supposed to have BMP drawn by home health today but she has appt with PCP tomorrow. She is requesting to have her PCP office draw it tomorrow as she will be having other labs drawn too and wants to minimize the number of sticks. PCP is a [facility] and results will be viewable in EPIC.

## 2013-08-17 NOTE — Telephone Encounter (Signed)
New Problem:  Mrs [name] states the pt was suppose to have a BMP drawn today by [facility]. Mr [name] states the patient requested she not draw the lab and that the pt wait til tomorrow.. The pt is going to her PcP for other labs and will have the BMP drawn then.Marland KitchenMarland Kitchen

## 2013-08-17 NOTE — Telephone Encounter (Signed)
Have BMET drawn by primary care MD

## 2013-08-18 ENCOUNTER — Encounter: Payer: Self-pay | Admitting: Internal Medicine

## 2013-08-18 ENCOUNTER — Other Ambulatory Visit: Payer: Self-pay | Admitting: Internal Medicine

## 2013-08-18 ENCOUNTER — Encounter: Payer: Self-pay | Admitting: Vascular Surgery

## 2013-08-18 ENCOUNTER — Ambulatory Visit: Payer: Medicare Other | Admitting: Vascular Surgery

## 2013-08-18 ENCOUNTER — Telehealth: Payer: Self-pay | Admitting: Dietician

## 2013-08-18 ENCOUNTER — Ambulatory Visit (INDEPENDENT_AMBULATORY_CARE_PROVIDER_SITE_OTHER): Payer: Medicare Other | Admitting: Internal Medicine

## 2013-08-18 VITALS — BP 128/59 | HR 63 | Temp 96.9°F | Ht 63.5 in | Wt 165.5 lb

## 2013-08-18 VITALS — BP 165/42 | HR 65 | Temp 97.5°F | Ht 63.0 in | Wt 165.2 lb

## 2013-08-18 DIAGNOSIS — N058 Unspecified nephritic syndrome with other morphologic changes: Secondary | ICD-10-CM

## 2013-08-18 DIAGNOSIS — I509 Heart failure, unspecified: Secondary | ICD-10-CM

## 2013-08-18 DIAGNOSIS — E538 Deficiency of other specified B group vitamins: Secondary | ICD-10-CM | POA: Diagnosis not present

## 2013-08-18 DIAGNOSIS — E039 Hypothyroidism, unspecified: Secondary | ICD-10-CM

## 2013-08-18 DIAGNOSIS — R0609 Other forms of dyspnea: Secondary | ICD-10-CM

## 2013-08-18 DIAGNOSIS — I739 Peripheral vascular disease, unspecified: Secondary | ICD-10-CM

## 2013-08-18 DIAGNOSIS — R0989 Other specified symptoms and signs involving the circulatory and respiratory systems: Secondary | ICD-10-CM | POA: Diagnosis not present

## 2013-08-18 DIAGNOSIS — E1065 Type 1 diabetes mellitus with hyperglycemia: Secondary | ICD-10-CM

## 2013-08-18 DIAGNOSIS — I10 Essential (primary) hypertension: Secondary | ICD-10-CM

## 2013-08-18 DIAGNOSIS — Z0000 Encounter for general adult medical examination without abnormal findings: Secondary | ICD-10-CM

## 2013-08-18 DIAGNOSIS — E1029 Type 1 diabetes mellitus with other diabetic kidney complication: Secondary | ICD-10-CM

## 2013-08-18 DIAGNOSIS — N183 Chronic kidney disease, stage 3 unspecified: Secondary | ICD-10-CM | POA: Diagnosis not present

## 2013-08-18 LAB — BASIC METABOLIC PANEL WITH GFR
BUN: 29 mg/dL — ABNORMAL HIGH (ref 6–23)
CO2: 28 mEq/L (ref 19–32)
Chloride: 102 mEq/L (ref 96–112)
Creat: 1.26 mg/dL — ABNORMAL HIGH (ref 0.50–1.10)
Glucose, Bld: 80 mg/dL (ref 70–99)
Potassium: 4.4 mEq/L (ref 3.5–5.3)
Sodium: 137 mEq/L (ref 135–145)

## 2013-08-18 LAB — GLUCOSE, CAPILLARY: Glucose-Capillary: 86 mg/dL (ref 70–99)

## 2013-08-18 NOTE — Assessment & Plan Note (Signed)
BP Readings from Last 3 Encounters:  08/18/13 128/59  08/18/13 165/42  08/06/13 138/44    Lab Results  Component Value Date   NA 140 07/08/2013   K 4.1 07/08/2013   CREATININE 1.2 07/08/2013    Assessment: Blood pressure control: controlled Progress toward BP goal:  at goal Comments:   Plan: Medications:  continue current medications Educational resources provided: brochure;handout;video Self management tools provided:   Other plans: it is well controled. Will continue current regimen. Will check BMP today

## 2013-08-18 NOTE — Patient Instructions (Signed)
1. You have done great job in taking all your medications. I appreciate it very much. Please continue doing that. 2. Please take all medications as prescribed.  3. If you have worsening of your symptoms or new symptoms arise, please call the clinic ([phone]), or go to the ER immediately if symptoms are severe.     

## 2013-08-18 NOTE — Progress Notes (Signed)
Does today as an add-on. The home health nurse was concern regarding potential cellulitis. She actually feels quite good it looks quite good and has been walking with her dark and she without any difficulty. She does have continued healing of her ray amputation of her right foot. There is an area at the distal extent of the incision there is a slight amount of tendon exposed and this was abraded in the office. There is no erythema and no evidence of deep infection.  Hand-held Doppler shows biphasic flow in her posterior tibial and monophasic flow in the anterior tibial and peroneal arteries.  Impression and plan continued healing of her ray amputation of right toe. She does have changes of venous hypertension. She had massive edema the last time I saw her this is improved. I think this is the cause of her skin changes. She will continue with elevation and diuresis. We will see her in 6 weeks for continued followup

## 2013-08-18 NOTE — Assessment & Plan Note (Signed)
Patient is currently taking Synthroid 125 mcg daily. Her previous TSH was elevated at 18.6 on 06/22/13 when she was just out of the hospital due to foot osteomyelitis. I did not adjust her Synthroid dosage in that time. We'll check her TSH and make adjustments for her Synthroid dosage accordingly.

## 2013-08-18 NOTE — Assessment & Plan Note (Signed)
-  will check Micro albumin/cre ratio -Patient refused health maintenance related test or examination, including eye examination, mammogram, colonoscopy, Zostavax vaccine, pneumococcal vaccination, flu vaccination

## 2013-08-18 NOTE — Assessment & Plan Note (Signed)
Lab Results  Component Value Date   HGBA1C 8.7 08/18/2013   HGBA1C 9.8 05/22/2013   HGBA1C 9.3* 11/29/2012     Assessment: Diabetes control: fair control Progress toward A1C goal:  improved Comments:   Plan: Medications:  continue current medications Home glucose monitoring: Frequency:   Timing:   Instruction/counseling given: reminded to bring blood glucose meter & log to each visit Educational resources provided: brochure;handout Self management tools provided:   Other plans: improved A1c 9.8-->8.7. Will continue current regimen.

## 2013-08-18 NOTE — Assessment & Plan Note (Signed)
Her cre was stable. Last creatinine was 1.2 on 07/08/13 which was below her baseline (1.5-2). We'll check BMP today.

## 2013-08-18 NOTE — Assessment & Plan Note (Signed)
It is managed by cardiologist, [name]. Patient is currently taking Lasix 60-80 mg daily alternatively. She has a 1+ pitting edema in legs. But she does not have JVD. Her lung auscultation is clear bilaterally. Her recent creatinine was 1.2 on 07/08/13. Will continue current regimen. She will have followup appointment with [name] in December, 2014.

## 2013-08-18 NOTE — Progress Notes (Addendum)
Patient ID: [name], female   DOB: 06-02-1946, 67 y.o.   MRN: [id number] Subjective:   Patient ID: [name] female   DOB: June 22, 1946 67 y.o.   MRN: [id number]  CC:   Follow up visit. HPI:  [name] is a 67 y.o. man lady with past medical history as outlined below, who presents for a followup visit today   1. Root foot osteomyelitis: Because of the right 5th toe osteomyelitis, patient did 5th toe Ray amputation on 05/27/13. She has completed 4 weeks of IV meropenem and oral doxycycline treatment on 9/15. Patient was just seen by [name] in this morning. Patient was found to have an area at the distal extent of the incision. There is a slight amount of tendon exposed and this was abraded in the office. Hand-held Doppler was performed in Dr. Bosie Helper office, which showed biphasic flow in her posterior tibial and monophasic flow in the anterior tibial and peroneal arteries. Per dr. Bosie Helper note, patient's wound is healing well. She has some changes of venous hypertension which is likely due to leg edema per [name]. She will be followed up in 6 weeks by [name]. Currently patient does not have fever or chills. She does not have foot pain anymore.   2. CHF: Her 2-D echo on 05/29/13 showed EF of 30%. She is currently taking Lasix 80 and 60 mg every other days alternatively.  Her shortness is at her baseline. She still has leg edema. Her body weight decreased from 181 on 06/09/13, 171 on 9/19-->165.5 pounds today. Her blood pressure is normal today. She has an appointment with the [name] in Dec, 2014.   3. DM-I: Her recent A1c was 9.8 on 05/22/13-->8.7 today. Currently she is taking Lantus 20 to 22 units in the morning and 10 U qhs, and Humalog by sliding scale insulin. She does not have symptoms of hypo-or hyperglycemia.    4. CKD-III: Her baseline creatinine is between 1.5-2.0. Her recent creatinine was 1.2 on 07/08/13.   5. Hypothyroidism: Patient is currently taking  Synthroid 125 mcg daily. She reports having a lot of falling airs recently. She said she had a permanent hair care by beautician recently. She is not very sure whether her falling hairs are from hypothyroidism or from her recent chemical use for permanent hair care. Her TSH was significantly elevated at 18.6 on 06/22/13 when she was just out of the hospital due to acute right foot infection. I did not adjust her Synthroid dosage due to her acute infection.  ROS:  Denies fever, chills, headaches,  cough, chest pain, abdominal pain, diarrhea, constipation, dysuria, urgency, frequency, hematuria.   Past Medical History  Diagnosis Date  . HTN (hypertension)   . Hypothyroidism   . History of recurrent UTIs   . CAD (coronary artery disease)     s/p CABG x3 in 1993; last cath in 2010  . Scleroderma   . Bilateral carpal tunnel syndrome 1970s  . 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 Followed by [name]  . Claudication   . Aortic stenosis     0.8cm2 on echo in 10/2011  . Hyperlipidemia     on Lipitor  . Cat bite 2009    with MRSA; required I & D  . Heart murmur   . Blood transfusion     no reaction from transfusion; "when I had heart surgery" (08/28/2012)  .  GERD (gastroesophageal reflux disease)   . CHF (congestive heart failure) Aug. 2012    Echo 10/2011: Mild LVH, EF 30%, apex akinetic, septal HK, grade 2 diastolic dysfunction, or functionally bicuspid aortic valve, mild aortic stenosis by gradient, severe by calculated AVA-suspect A. Aortic stenosis probably moderate, trivial MR, mild LAE, mild RAE.   [name] Cellulitis   . Anemia   . B12 deficiency anemia     B 12 injection "monthly since 05/2012" (08/28/2012)  . Iron deficiency anemia 06/13/2012    "iron infusion" (08/28/2012  . Anginal pain     "history; not currently" (08/28/2012)  . Myocardial infarction 1986    "silent"  . History of bronchitis     "I've had it twice in  my life" (08/28/2012)  . Exertional dyspnea     "because of the heart failure" (08/28/2012)  . Type 1 diabetes 10/1949  . Stomach ulcer 1981?    "on Tagament for ~ 1 yr" (08/28/2012)  . Seizure     ? seizure like event in 2010; workup included stress testing which led to repeat cath.   . Arthritis     "hands, hips, all over" (08/28/2012  . Osteoarthritis of both feet   . Breast cancer     left  . Cellulitis June 2013, Nov. 2013 and Feb. 14, 2014    Left leg   Current Outpatient Prescriptions  Medication Sig Dispense Refill  . acetaminophen (TYLENOL) 325 MG tablet Take 650 mg by mouth every 6 (six) hours as needed for fever.      [name] aspirin EC 325 MG EC tablet Take 325 mg by mouth daily.       [name] atorvastatin (LIPITOR) 40 MG tablet Take 40 mg by mouth at bedtime.      . cyanocobalamin (,VITAMIN B-12,) 1000 MCG/ML injection Inject 1 mL (1,000 mcg total) into the muscle once.  1 mL  0  . docusate sodium (COLACE) 50 MG capsule Take by mouth 2 (two) times daily.      . Ferrous Sulfate (SLOW RELEASE IRON) 50 MG TBCR Take 50 mg by mouth daily.       . furosemide (LASIX) 20 MG tablet Take 80 mg by mouth daily. Pt takes 80mg  one day alternating with 60mg  every other day      . glucose blood test strip Use as instructed  100 each  11  . insulin glargine (LANTUS) 100 UNIT/ML injection Inject 0.04 mL (4 Units total) into the skin 2 (two) times daily.  10 mL  12  . Insulin Lispro, Human, (HUMALOG [place]) Inject into the skin. Sliding scale      . isosorbide mononitrate (IMDUR) 30 MG 24 hr tablet Take 30 mg by mouth at bedtime.       [name] levothyroxine (SYNTHROID, LEVOTHROID) 125 MCG tablet Take 1 tablet (125 mcg total) by mouth daily.  30 tablet  3  . metoprolol succinate (TOPROL-XL) 25 MG 24 hr tablet Take 1 tablet (25 mg total) by mouth daily.  30 tablet  11   No current facility-administered medications for this visit.   Family History  Problem Relation Age of Onset  . Diabetes Mother    History    Social History  . Marital Status: Single    Spouse Name: N/A    Number of Children: N/A  . Years of Education: N/A   Occupational History  . Environmental health practitioner    Social History Main Topics  . Smoking status: Never Smoker   .  Smokeless tobacco: Never Used  . Alcohol Use: No  . Drug Use: No  . Sexual Activity: Not Currently    Birth Control/ Protection: Post-menopausal   Other Topics Concern  . None   Social History Narrative  . None    Review of Systems: Full 14-point review of systems otherwise negative. See HPI.   Objective:  Physical Exam: Filed Vitals:   08/18/13 1340  BP: 128/59  Pulse: 63  Temp: 96.9 F (36.1 C)  TempSrc: Oral  Height: 5' 3.5" (1.613 m)  Weight: 165 lb 8 oz (75.07 kg)  SpO2: 96%   Head: Normocephalic and atraumatic.   Eyes: Conjunctivae and EOM are normal. Pupils are equal, round, and reactive to light.   Neck: Normal range of motion. Neck supple.   Cardiovascular: Normal rate, regular rhythm and intact distal pulses.  2/6 systolic murmur over aortic area.  Pulmonary/Chest: Effort normal and breath sounds normal. No respiratory distress. She has no wheezes. She has no rales. She exhibits no tenderness.  Musculoskeletal: Normal range of motion.  Neurological: She is alert and oriented to person, place, and time. No cranial nerve deficit.   Skin: Skin is warm and dry. She is not diaphoretic.   Extremeties: 1+ pitting edema in legs. 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 Dressing is dry, no drainage. Exposed toes are warm. No foul odor.  Psychiatric: Affect and judgment normal   Assessment & Plan:   Addendum:  Patient's TSH was normal at 1.475 on 08/18/13. Will continue current regimen.   [name], MD PGY3, Internal Medicine Teaching Service Pager: [phone]

## 2013-08-18 NOTE — Telephone Encounter (Signed)
Patient did not want [place] to request 2012 eye exam report from [facility]

## 2013-08-18 NOTE — Telephone Encounter (Signed)
Labs are scheduled with PCP

## 2013-08-19 DIAGNOSIS — Z4789 Encounter for other orthopedic aftercare: Secondary | ICD-10-CM | POA: Diagnosis not present

## 2013-08-19 DIAGNOSIS — I129 Hypertensive chronic kidney disease with stage 1 through stage 4 chronic kidney disease, or unspecified chronic kidney disease: Secondary | ICD-10-CM | POA: Diagnosis not present

## 2013-08-19 DIAGNOSIS — E1129 Type 2 diabetes mellitus with other diabetic kidney complication: Secondary | ICD-10-CM | POA: Diagnosis not present

## 2013-08-19 DIAGNOSIS — I739 Peripheral vascular disease, unspecified: Secondary | ICD-10-CM | POA: Diagnosis not present

## 2013-08-19 DIAGNOSIS — I509 Heart failure, unspecified: Secondary | ICD-10-CM | POA: Diagnosis not present

## 2013-08-19 DIAGNOSIS — N189 Chronic kidney disease, unspecified: Secondary | ICD-10-CM | POA: Diagnosis not present

## 2013-08-19 LAB — MICROALBUMIN / CREATININE URINE RATIO: Creatinine, Urine: 29.7 mg/dL

## 2013-08-19 LAB — TSH: TSH: 1.475 u[IU]/mL (ref 0.350–4.500)

## 2013-08-19 LAB — PRO B NATRIURETIC PEPTIDE: Pro B Natriuretic peptide (BNP): 16366 pg/mL — ABNORMAL HIGH (ref <126)

## 2013-08-19 NOTE — Progress Notes (Signed)
Case discussed with Dr. Niu at the time of the visit.  We reviewed the resident's history and exam and pertinent patient test results.  I agree with the assessment, diagnosis, and plan of care documented in the resident's note.    

## 2013-08-19 NOTE — Addendum Note (Signed)
Addended by: Bufford Spikes on: 08/19/2013 10:31 AM   Modules accepted: Orders

## 2013-08-19 NOTE — Addendum Note (Signed)
Addended by: Bufford Spikes on: 08/19/2013 10:39 AM   Modules accepted: Orders

## 2013-08-20 ENCOUNTER — Encounter: Payer: Self-pay | Admitting: Internal Medicine

## 2013-08-20 DIAGNOSIS — E1129 Type 2 diabetes mellitus with other diabetic kidney complication: Secondary | ICD-10-CM | POA: Diagnosis not present

## 2013-08-20 DIAGNOSIS — Z4789 Encounter for other orthopedic aftercare: Secondary | ICD-10-CM | POA: Diagnosis not present

## 2013-08-20 DIAGNOSIS — I509 Heart failure, unspecified: Secondary | ICD-10-CM | POA: Diagnosis not present

## 2013-08-20 DIAGNOSIS — N189 Chronic kidney disease, unspecified: Secondary | ICD-10-CM | POA: Diagnosis not present

## 2013-08-20 DIAGNOSIS — I129 Hypertensive chronic kidney disease with stage 1 through stage 4 chronic kidney disease, or unspecified chronic kidney disease: Secondary | ICD-10-CM | POA: Diagnosis not present

## 2013-08-20 DIAGNOSIS — I739 Peripheral vascular disease, unspecified: Secondary | ICD-10-CM | POA: Diagnosis not present

## 2013-08-20 NOTE — Progress Notes (Signed)
Lab done on 08/18/13.   Recent Labs  08/18/13 1435  NA 137  K 4.4  CL 102  CO2 28  GLUCOSE 80  BUN 29*  CREATININE 1.26*  CALCIUM 9.3   Recent Labs  08/18/13 1441  GLUCAP 86   Hemoglobin A1C:  Recent Labs  08/18/13 1517  HGBA1C 8.7    Recent Labs  08/18/13 1435  TSH 1.475    Pro BMP on 08/18/13: 16109   [name], MD PGY3, Internal Medicine Teaching Service Pager: [phone]

## 2013-08-20 NOTE — Addendum Note (Signed)
Addended by: Bufford Spikes on: 08/20/2013 09:10 AM   Modules accepted: Orders

## 2013-08-20 NOTE — Progress Notes (Signed)
Lab results faxed to Dr Gunnar Fusi Ross/Debra M RN  [facility] 08-20-2013  [phone].   [phone] I called and spoke with Victorio Palm  RN, ,  at [phone] to notify her that final results are available for Dr Tenny Craw to review.  Alric Quan, PBT 11-06-014  [phone]

## 2013-08-21 ENCOUNTER — Encounter: Payer: Self-pay | Admitting: Infectious Disease

## 2013-08-24 DIAGNOSIS — I739 Peripheral vascular disease, unspecified: Secondary | ICD-10-CM | POA: Diagnosis not present

## 2013-08-24 DIAGNOSIS — E1129 Type 2 diabetes mellitus with other diabetic kidney complication: Secondary | ICD-10-CM | POA: Diagnosis not present

## 2013-08-24 DIAGNOSIS — I10 Essential (primary) hypertension: Secondary | ICD-10-CM | POA: Diagnosis not present

## 2013-08-24 DIAGNOSIS — N189 Chronic kidney disease, unspecified: Secondary | ICD-10-CM | POA: Diagnosis not present

## 2013-08-24 DIAGNOSIS — E109 Type 1 diabetes mellitus without complications: Secondary | ICD-10-CM | POA: Diagnosis not present

## 2013-08-24 DIAGNOSIS — I509 Heart failure, unspecified: Secondary | ICD-10-CM | POA: Diagnosis not present

## 2013-08-24 DIAGNOSIS — I129 Hypertensive chronic kidney disease with stage 1 through stage 4 chronic kidney disease, or unspecified chronic kidney disease: Secondary | ICD-10-CM | POA: Diagnosis not present

## 2013-08-24 DIAGNOSIS — E039 Hypothyroidism, unspecified: Secondary | ICD-10-CM | POA: Diagnosis not present

## 2013-08-24 DIAGNOSIS — R809 Proteinuria, unspecified: Secondary | ICD-10-CM | POA: Diagnosis not present

## 2013-08-24 DIAGNOSIS — Z4789 Encounter for other orthopedic aftercare: Secondary | ICD-10-CM | POA: Diagnosis not present

## 2013-08-25 ENCOUNTER — Telehealth: Payer: Self-pay | Admitting: *Deleted

## 2013-08-25 ENCOUNTER — Ambulatory Visit: Payer: Medicare Other | Admitting: Vascular Surgery

## 2013-08-25 NOTE — Telephone Encounter (Signed)
Pt calls and states she would like for the doctor to call her and discuss labs of recent time, please call her at [phone]

## 2013-08-26 DIAGNOSIS — N189 Chronic kidney disease, unspecified: Secondary | ICD-10-CM | POA: Diagnosis not present

## 2013-08-26 DIAGNOSIS — I509 Heart failure, unspecified: Secondary | ICD-10-CM | POA: Diagnosis not present

## 2013-08-26 DIAGNOSIS — E1129 Type 2 diabetes mellitus with other diabetic kidney complication: Secondary | ICD-10-CM | POA: Diagnosis not present

## 2013-08-26 DIAGNOSIS — I739 Peripheral vascular disease, unspecified: Secondary | ICD-10-CM | POA: Diagnosis not present

## 2013-08-26 DIAGNOSIS — I129 Hypertensive chronic kidney disease with stage 1 through stage 4 chronic kidney disease, or unspecified chronic kidney disease: Secondary | ICD-10-CM | POA: Diagnosis not present

## 2013-08-26 DIAGNOSIS — Z4789 Encounter for other orthopedic aftercare: Secondary | ICD-10-CM | POA: Diagnosis not present

## 2013-08-27 ENCOUNTER — Telehealth: Payer: Self-pay | Admitting: Internal Medicine

## 2013-08-27 DIAGNOSIS — E1129 Type 2 diabetes mellitus with other diabetic kidney complication: Secondary | ICD-10-CM | POA: Diagnosis not present

## 2013-08-27 DIAGNOSIS — L650 Telogen effluvium: Secondary | ICD-10-CM | POA: Diagnosis not present

## 2013-08-27 DIAGNOSIS — I129 Hypertensive chronic kidney disease with stage 1 through stage 4 chronic kidney disease, or unspecified chronic kidney disease: Secondary | ICD-10-CM | POA: Diagnosis not present

## 2013-08-27 DIAGNOSIS — I739 Peripheral vascular disease, unspecified: Secondary | ICD-10-CM | POA: Diagnosis not present

## 2013-08-27 DIAGNOSIS — I509 Heart failure, unspecified: Secondary | ICD-10-CM | POA: Diagnosis not present

## 2013-08-27 DIAGNOSIS — Z4789 Encounter for other orthopedic aftercare: Secondary | ICD-10-CM | POA: Diagnosis not present

## 2013-08-27 DIAGNOSIS — N189 Chronic kidney disease, unspecified: Secondary | ICD-10-CM | POA: Diagnosis not present

## 2013-08-27 NOTE — Telephone Encounter (Signed)
Spoke with chris, orders given for bmp given.

## 2013-08-27 NOTE — Telephone Encounter (Signed)
New message     Pt states that Dr Tenny Craw want labs drawn this week but [facility] has no orders.

## 2013-08-28 ENCOUNTER — Other Ambulatory Visit (HOSPITAL_BASED_OUTPATIENT_CLINIC_OR_DEPARTMENT_OTHER): Payer: Medicare Other

## 2013-08-28 ENCOUNTER — Telehealth: Payer: Self-pay | Admitting: Internal Medicine

## 2013-08-28 DIAGNOSIS — I509 Heart failure, unspecified: Secondary | ICD-10-CM | POA: Diagnosis not present

## 2013-08-28 DIAGNOSIS — D518 Other vitamin B12 deficiency anemias: Secondary | ICD-10-CM

## 2013-08-28 DIAGNOSIS — I739 Peripheral vascular disease, unspecified: Secondary | ICD-10-CM | POA: Diagnosis not present

## 2013-08-28 DIAGNOSIS — E1129 Type 2 diabetes mellitus with other diabetic kidney complication: Secondary | ICD-10-CM | POA: Diagnosis not present

## 2013-08-28 DIAGNOSIS — D649 Anemia, unspecified: Secondary | ICD-10-CM | POA: Diagnosis not present

## 2013-08-28 DIAGNOSIS — I129 Hypertensive chronic kidney disease with stage 1 through stage 4 chronic kidney disease, or unspecified chronic kidney disease: Secondary | ICD-10-CM | POA: Diagnosis not present

## 2013-08-28 DIAGNOSIS — N189 Chronic kidney disease, unspecified: Secondary | ICD-10-CM | POA: Diagnosis not present

## 2013-08-28 DIAGNOSIS — D509 Iron deficiency anemia, unspecified: Secondary | ICD-10-CM

## 2013-08-28 DIAGNOSIS — Z4789 Encounter for other orthopedic aftercare: Secondary | ICD-10-CM | POA: Diagnosis not present

## 2013-08-28 LAB — CBC WITH DIFFERENTIAL/PLATELET
BASO%: 1 % (ref 0.0–2.0)
LYMPH%: 12.8 % — ABNORMAL LOW (ref 14.0–49.7)
MCHC: 31.9 g/dL (ref 31.5–36.0)
MCV: 93.3 fL (ref 79.5–101.0)
MONO#: 0.7 10*3/uL (ref 0.1–0.9)
RBC: 3.97 10*6/uL (ref 3.70–5.45)
RDW: 17.6 % — ABNORMAL HIGH (ref 11.2–14.5)
WBC: 5.4 10*3/uL (ref 3.9–10.3)
lymph#: 0.7 10*3/uL — ABNORMAL LOW (ref 0.9–3.3)

## 2013-08-28 LAB — FERRITIN CHCC: Ferritin: 104 ng/ml (ref 9–269)

## 2013-08-28 LAB — VITAMIN B12: Vitamin B-12: 712 pg/mL (ref 211–911)

## 2013-08-28 NOTE — Telephone Encounter (Signed)
Follow Up   Ramiro Harvest w/ [facility] states pt is due for a BMT. Going to cancer center for lab work.... Request orders to have lab orders sent to cancer center.. The appointment is at 12:30.    Please fax orders to:  Fax # [phone]

## 2013-08-31 ENCOUNTER — Ambulatory Visit: Payer: Medicare Other

## 2013-08-31 ENCOUNTER — Telehealth: Payer: Self-pay | Admitting: Internal Medicine

## 2013-08-31 ENCOUNTER — Other Ambulatory Visit: Payer: Self-pay | Admitting: Internal Medicine

## 2013-08-31 DIAGNOSIS — I739 Peripheral vascular disease, unspecified: Secondary | ICD-10-CM | POA: Diagnosis not present

## 2013-08-31 DIAGNOSIS — N189 Chronic kidney disease, unspecified: Secondary | ICD-10-CM | POA: Diagnosis not present

## 2013-08-31 DIAGNOSIS — E1129 Type 2 diabetes mellitus with other diabetic kidney complication: Secondary | ICD-10-CM | POA: Diagnosis not present

## 2013-08-31 DIAGNOSIS — I129 Hypertensive chronic kidney disease with stage 1 through stage 4 chronic kidney disease, or unspecified chronic kidney disease: Secondary | ICD-10-CM | POA: Diagnosis not present

## 2013-08-31 DIAGNOSIS — I509 Heart failure, unspecified: Secondary | ICD-10-CM | POA: Diagnosis not present

## 2013-08-31 DIAGNOSIS — Z4789 Encounter for other orthopedic aftercare: Secondary | ICD-10-CM | POA: Diagnosis not present

## 2013-08-31 NOTE — Telephone Encounter (Signed)
New problem  Beth w/[facility]: patient has had 2pd weight gain since yesterday. 169.9 lbs. Vitals are good, but she is seeming depressed due to the weight loss. Please call and advise

## 2013-09-01 ENCOUNTER — Encounter: Payer: Self-pay | Admitting: Adult Health

## 2013-09-01 ENCOUNTER — Ambulatory Visit: Payer: Medicare Other

## 2013-09-01 ENCOUNTER — Ambulatory Visit (HOSPITAL_BASED_OUTPATIENT_CLINIC_OR_DEPARTMENT_OTHER): Payer: Medicare Other | Admitting: Adult Health

## 2013-09-01 ENCOUNTER — Encounter: Payer: Self-pay | Admitting: *Deleted

## 2013-09-01 VITALS — BP 111/69 | HR 69 | Temp 97.2°F | Resp 20 | Ht 63.5 in | Wt 171.1 lb

## 2013-09-01 DIAGNOSIS — D509 Iron deficiency anemia, unspecified: Secondary | ICD-10-CM | POA: Diagnosis not present

## 2013-09-01 DIAGNOSIS — D518 Other vitamin B12 deficiency anemias: Secondary | ICD-10-CM | POA: Diagnosis not present

## 2013-09-01 NOTE — Progress Notes (Signed)
OFFICE PROGRESS NOTE  CC  [name] Harp, MD [address] [place] [address]  DIAGNOSIS: 67 year old female with iron deficiency and B12 deficiency anemia  PRIOR THERAPY:  #1 patient is status post parenteral iron and currently receiving B12 injections every month.  CURRENT THERAPY: B12 injections q. monthly  INTERVAL HISTORY: [name] 67 y.o. female returns for follow up and evaluation of her iron deficiency and B12 deficiency anemia.  She is doing well from an anemia standpoint.  Her hemoglobin is normal today.  She does continue to have problems from a heart failure standpoint with swelling in her legs and abdomen, and has close follow up with her cardiologist.  Otherwise, she is doing well, and a 10 point ROS is negative.   MEDICAL HISTORY: Past Medical History  Diagnosis Date  . HTN (hypertension)   . Hypothyroidism   . History of recurrent UTIs   . CAD (coronary artery disease)     s/p CABG x3 in 1993; last cath in 2010  . Scleroderma   . Bilateral carpal tunnel syndrome 1970s  . 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 Followed by [name]  . Claudication   . Aortic stenosis     0.8cm2 on echo in 10/2011  . Hyperlipidemia     on Lipitor  . Cat bite 2009    with MRSA; required I & D  . Heart murmur   . Blood transfusion     no reaction from transfusion; "when I had heart surgery" (08/28/2012)  . GERD (gastroesophageal reflux disease)   . CHF (congestive heart failure) Aug. 2012    Echo 10/2011: Mild LVH, EF 30%, apex akinetic, septal HK, grade 2 diastolic dysfunction, or functionally bicuspid aortic valve, mild aortic stenosis by gradient, severe by calculated AVA-suspect A. Aortic stenosis probably moderate, trivial MR, mild LAE, mild RAE.   [name] Cellulitis   . Anemia   . B12 deficiency anemia     B 12 injection "monthly since 05/2012" (08/28/2012)  . Iron deficiency anemia 06/13/2012     "iron infusion" (08/28/2012  . Anginal pain     "history; not currently" (08/28/2012)  . Myocardial infarction 1986    "silent"  . History of bronchitis     "I've had it twice in my life" (08/28/2012)  . Exertional dyspnea     "because of the heart failure" (08/28/2012)  . Type 1 diabetes 10/1949  . Stomach ulcer 1981?    "on Tagament for ~ 1 yr" (08/28/2012)  . Seizure     ? seizure like event in 2010; workup included stress testing which led to repeat cath.   . Arthritis     "hands, hips, all over" (08/28/2012  . Osteoarthritis of both feet   . Breast cancer     left  . Cellulitis June 2013, Nov. 2013 and Feb. 14, 2014    Left leg    ALLERGIES:  is allergic to adhesive; zyvox; bactrim; cephalexin; ciprofloxacin; daptomycin; and vancomycin.  MEDICATIONS:  Current Outpatient Prescriptions  Medication Sig Dispense Refill  . aspirin EC 325 MG EC tablet Take 325 mg by mouth daily.       [name] atorvastatin (LIPITOR) 40 MG tablet Take 40 mg by mouth at bedtime.      . cyanocobalamin (,VITAMIN B-12,) 1000 MCG/ML injection Inject 1 mL (1,000 mcg total) into the muscle once.  1 mL  0  .  docusate sodium (COLACE) 50 MG capsule Take by mouth 2 (two) times daily.      . Ferrous Sulfate (SLOW RELEASE IRON) 50 MG TBCR Take 50 mg by mouth daily.       . furosemide (LASIX) 20 MG tablet Take 60-80 mg by mouth daily. Pt takes 80mg  one day alternating with 60mg  every other day      . glucose blood test strip Use as instructed  100 each  11  . insulin glargine (LANTUS) 100 UNIT/ML injection Inject 10-22 Units into the skin 2 (two) times daily. 10 U qhs and 22 U in AM daily      . Insulin Lispro, Human, (HUMALOG [place]) Inject into the skin. Sliding scale      . isosorbide mononitrate (IMDUR) 30 MG 24 hr tablet Take 30 mg by mouth at bedtime.       [name] levothyroxine (SYNTHROID, LEVOTHROID) 125 MCG tablet Take 1 tablet (125 mcg total) by mouth daily.  30 tablet  3  . metoprolol succinate (TOPROL-XL) 25 MG 24  hr tablet Take 1 tablet (25 mg total) by mouth daily.  30 tablet  11  . acetaminophen (TYLENOL) 325 MG tablet Take 650 mg by mouth every 6 (six) hours as needed for fever.      . furosemide (LASIX) 40 MG tablet Take 2 tablets (80 mg total) by mouth daily.  60 tablet  1   No current facility-administered medications for this visit.    SURGICAL HISTORY:  Past Surgical History  Procedure Laterality Date  . Carpal tunnel release  ~ 1970's    bilaterally  . Mastectomy  11/1982    Left Breast  . Vitrectomy  1990's-2004    "both eyes; I've had total of 4"  . Tubal ligation  1984  . Tonsillectomy  1952    "?adenoids"   . Appendectomy  1991  . Femoral bypass      left leg "related to transmetatarsal amputation" (08/28/2012)  . Breast biopsy with sentinel lymph node biopsy and needle localization  1984  . Coronary artery bypass graft  1992    LIMA to LAD, SVG to distal LCX & SVG to Marginal  . Cardiac catheterization  2010    LIMA to LAD patent yet with occluded distal LAD after graft insertion & retrograde is occluded. 3-4 septal perforators arise &are patent. SVG to a large marginal patent; SVG presumably to the distal dominant LCX is occluded, moderately high grade stenosis of the AV circumflex, but with distal stenoses involving a severely diseased marginal branch not amenable  to PCI  nor is inferior branch   . Cardiac catheterization  10/2011    "unsuccessful attempt of stenting" (08/28/2012)  . Cardiac catheterization  1992; 09/2011  . Breast biopsy    . Trigger finger release  1980's    right thumb  . Incision and drainage of wound  ~ 1967    "infection in left foot" (08/28/2012)  . Incision and drainage of wound  2009    "3 ORs on my right hand after cat bite" (08/28/2012)  . 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 Cataract extraction w/ intraocular lens  implant, bilateral  1990's  . Amputation Right 05/27/2013    Procedure: AMPUTATION DIGIT FIFTH  TOE;  Surgeon: Larina Earthly, MD;  [facility] OR;  Service: Vascular;  Laterality: Right;    REVIEW OF SYSTEMS:  A 10 point review of systems was conducted  and is otherwise negative except for what is noted above.    PHYSICAL EXAMINATION: Blood pressure 111/69, pulse 69, temperature 97.2 F (36.2 C), temperature source Oral, resp. rate 20, height 5' 3.5" (1.613 m), weight 171 lb 1.6 oz (77.61 kg). Body mass index is 29.83 kg/(m^2). General: Patient is a well appearing female in no acute distress HEENT: PERRLA, sclerae anicteric no conjunctival pallor, MMM Neck: supple, no palpable adenopathy Lungs: clear to auscultation bilaterally, no wheezes, rhonchi, or rales Cardiovascular: regular rate rhythm, S1, S2, no murmurs, rubs or gallops Abdomen: Soft, non-tender, non-distended, normoactive bowel sounds, no HSM Extremities: warm and well perfused, no clubbing, cyanosis, 2+ edema in bilateral lower extremities Skin: No rashes or lesions Neuro: Non-focal ECOG PERFORMANCE STATUS: 1 - Symptomatic but completely ambulatory  LABORATORY DATA: Lab Results  Component Value Date   WBC 5.4 08/28/2013   HGB 11.8 08/28/2013   HCT 37.0 08/28/2013   MCV 93.3 08/28/2013   PLT 205 08/28/2013      Chemistry      Component Value Date/Time   NA 137 08/18/2013 1435   NA 137 08/07/2012 1101   NA 133* 11/29/2011 1629   K 4.4 08/18/2013 1435   K 4.1 08/07/2012 1101   CL 102 08/18/2013 1435   CL 102 08/07/2012 1101   CO2 28 08/18/2013 1435   CO2 25 08/07/2012 1101   BUN 29* 08/18/2013 1435   BUN 25.0 08/07/2012 1101   BUN 39* 11/29/2011 1629   CREATININE 1.26* 08/18/2013 1435   CREATININE 1.2 07/08/2013 0943   CREATININE 1.0 08/07/2012 1101      Component Value Date/Time   CALCIUM 9.3 08/18/2013 1435   CALCIUM 9.8 08/07/2012 1101   ALKPHOS 108 07/08/2013 0943   AST 20 07/08/2013 0943   ALT 20 07/08/2013 0943   BILITOT 1.0 07/08/2013 0943       RADIOGRAPHIC STUDIES:  No results  found.  ASSESSMENT: 67 year old female with B12 and iron deficiency anemia now significantly improved after having parenteral iron and B12 injections. Clinically she looks very well. She is very pleased with her response to the therapy. Her B12 levels are stable.    PLAN: Patient will continue to have B12 injections every month.  Her labs are stable.  We will see her back in 6 months for labs and follow up.  She is feeling well.  She will continue to follow up with her cardiologist for her CHF.   I spent 15 minutes counseling the patient face to face.  The total time spent in the appointment was 30 minutes.   Illa Level, NP Medical Oncology [facility] [phone] 09/01/2013, 12:10 PM

## 2013-09-01 NOTE — Progress Notes (Signed)
Pt "arrived" to flush room late - unable to give injection before pt needed to leave for other appointments.  Rescheduled injection to 09/02/2013 post 12n per pt request.  Appointment date and time left on identified VM per pt request.

## 2013-09-01 NOTE — Telephone Encounter (Signed)
appts made and printed...td 

## 2013-09-01 NOTE — Telephone Encounter (Signed)
New problem    Pt is having sob, wt is 170. Please advise

## 2013-09-01 NOTE — Patient Instructions (Signed)
Doing well.  Proceed with b12 injection.  We will see you back in 6 months.  Continue to follow up with [name] about your heart failure.  Please call us if you have any questions or concerns.

## 2013-09-01 NOTE — Telephone Encounter (Signed)
Left message for pt to call.

## 2013-09-02 ENCOUNTER — Other Ambulatory Visit: Payer: Self-pay | Admitting: *Deleted

## 2013-09-02 ENCOUNTER — Telehealth: Payer: Self-pay | Admitting: Internal Medicine

## 2013-09-02 ENCOUNTER — Ambulatory Visit (HOSPITAL_BASED_OUTPATIENT_CLINIC_OR_DEPARTMENT_OTHER): Payer: Medicare Other

## 2013-09-02 VITALS — BP 153/61 | HR 68 | Temp 97.2°F | Resp 16

## 2013-09-02 DIAGNOSIS — D518 Other vitamin B12 deficiency anemias: Secondary | ICD-10-CM

## 2013-09-02 DIAGNOSIS — R0602 Shortness of breath: Secondary | ICD-10-CM | POA: Diagnosis not present

## 2013-09-02 DIAGNOSIS — I509 Heart failure, unspecified: Secondary | ICD-10-CM | POA: Diagnosis not present

## 2013-09-02 DIAGNOSIS — R609 Edema, unspecified: Secondary | ICD-10-CM | POA: Diagnosis not present

## 2013-09-02 MED ORDER — CYANOCOBALAMIN 1000 MCG/ML IJ SOLN
1000.0000 ug | Freq: Once | INTRAMUSCULAR | Status: AC
  Administered 2013-09-02: 1000 ug via INTRAMUSCULAR

## 2013-09-02 MED ORDER — CYANOCOBALAMIN 1000 MCG/ML IJ SOLN
INTRAMUSCULAR | Status: AC
  Filled 2013-09-02: qty 1

## 2013-09-02 NOTE — Telephone Encounter (Signed)
Spoke with pt, she has been gradually putting on weight. She lost 2.5 lbs overnight but cont to have swelling in her feet and abdomen. There has been some miscommunication regarding lab work. Called and spoke with [facility], chris kennedy, will hgave bmp and bnp drawn tomorrow. The pt currently take 80 mg furosemide in the am and 60 mg in the pm. She will take 80 mg this afternoon to help with the swelling today. Will get the labs on Thursday and then discuss with dr Tenny Craw regarding further adjustments in her medicine. Pt agreed with this plan.

## 2013-09-02 NOTE — Telephone Encounter (Signed)
New Problem  Pt is gaining weight// retaining fluid non compliant// BMP drawn at the cancer center on Friday but an order is needed to run it  Please fax order to  [facility] [phone]

## 2013-09-02 NOTE — Telephone Encounter (Signed)
Follow up   Pt is returning your call please call her back

## 2013-09-03 DIAGNOSIS — E1129 Type 2 diabetes mellitus with other diabetic kidney complication: Secondary | ICD-10-CM | POA: Diagnosis not present

## 2013-09-03 DIAGNOSIS — Z4789 Encounter for other orthopedic aftercare: Secondary | ICD-10-CM | POA: Diagnosis not present

## 2013-09-03 DIAGNOSIS — I129 Hypertensive chronic kidney disease with stage 1 through stage 4 chronic kidney disease, or unspecified chronic kidney disease: Secondary | ICD-10-CM | POA: Diagnosis not present

## 2013-09-03 DIAGNOSIS — N189 Chronic kidney disease, unspecified: Secondary | ICD-10-CM | POA: Diagnosis not present

## 2013-09-03 DIAGNOSIS — I739 Peripheral vascular disease, unspecified: Secondary | ICD-10-CM | POA: Diagnosis not present

## 2013-09-03 DIAGNOSIS — I509 Heart failure, unspecified: Secondary | ICD-10-CM | POA: Diagnosis not present

## 2013-09-03 LAB — BASIC METABOLIC PANEL WITH GFR
BUN: 31 mg/dL — ABNORMAL HIGH (ref 6–23)
CO2: 25 mEq/L (ref 19–32)
Calcium: 8.8 mg/dL (ref 8.4–10.5)
Chloride: 104 mEq/L (ref 96–112)
Creat: 1.29 mg/dL — ABNORMAL HIGH (ref 0.50–1.10)
Glucose, Bld: 282 mg/dL — ABNORMAL HIGH (ref 70–99)

## 2013-09-04 ENCOUNTER — Telehealth: Payer: Self-pay | Admitting: Internal Medicine

## 2013-09-04 ENCOUNTER — Encounter: Payer: Self-pay | Admitting: Internal Medicine

## 2013-09-04 DIAGNOSIS — D518 Other vitamin B12 deficiency anemias: Secondary | ICD-10-CM | POA: Diagnosis not present

## 2013-09-04 DIAGNOSIS — N189 Chronic kidney disease, unspecified: Secondary | ICD-10-CM | POA: Diagnosis not present

## 2013-09-04 DIAGNOSIS — I129 Hypertensive chronic kidney disease with stage 1 through stage 4 chronic kidney disease, or unspecified chronic kidney disease: Secondary | ICD-10-CM | POA: Diagnosis not present

## 2013-09-04 DIAGNOSIS — E1129 Type 2 diabetes mellitus with other diabetic kidney complication: Secondary | ICD-10-CM | POA: Diagnosis not present

## 2013-09-04 DIAGNOSIS — Z4789 Encounter for other orthopedic aftercare: Secondary | ICD-10-CM | POA: Diagnosis not present

## 2013-09-04 DIAGNOSIS — S98139A Complete traumatic amputation of one unspecified lesser toe, initial encounter: Secondary | ICD-10-CM | POA: Diagnosis not present

## 2013-09-04 DIAGNOSIS — I739 Peripheral vascular disease, unspecified: Secondary | ICD-10-CM | POA: Diagnosis not present

## 2013-09-04 DIAGNOSIS — I509 Heart failure, unspecified: Secondary | ICD-10-CM | POA: Diagnosis not present

## 2013-09-04 NOTE — Telephone Encounter (Signed)
Spoke with chris, bmp and bnp orders given.

## 2013-09-04 NOTE — Telephone Encounter (Signed)
New Problem:  Ramiro Harvest states she is still waiting to hear from the nurse if she needs to draw labs on the pt today.

## 2013-09-06 DIAGNOSIS — I129 Hypertensive chronic kidney disease with stage 1 through stage 4 chronic kidney disease, or unspecified chronic kidney disease: Secondary | ICD-10-CM | POA: Diagnosis not present

## 2013-09-06 DIAGNOSIS — Z4789 Encounter for other orthopedic aftercare: Secondary | ICD-10-CM | POA: Diagnosis not present

## 2013-09-06 DIAGNOSIS — I739 Peripheral vascular disease, unspecified: Secondary | ICD-10-CM | POA: Diagnosis not present

## 2013-09-06 DIAGNOSIS — I509 Heart failure, unspecified: Secondary | ICD-10-CM | POA: Diagnosis not present

## 2013-09-06 DIAGNOSIS — E1129 Type 2 diabetes mellitus with other diabetic kidney complication: Secondary | ICD-10-CM | POA: Diagnosis not present

## 2013-09-06 DIAGNOSIS — N189 Chronic kidney disease, unspecified: Secondary | ICD-10-CM | POA: Diagnosis not present

## 2013-09-07 DIAGNOSIS — Z4789 Encounter for other orthopedic aftercare: Secondary | ICD-10-CM | POA: Diagnosis not present

## 2013-09-07 DIAGNOSIS — E1129 Type 2 diabetes mellitus with other diabetic kidney complication: Secondary | ICD-10-CM | POA: Diagnosis not present

## 2013-09-07 DIAGNOSIS — N189 Chronic kidney disease, unspecified: Secondary | ICD-10-CM | POA: Diagnosis not present

## 2013-09-07 DIAGNOSIS — I739 Peripheral vascular disease, unspecified: Secondary | ICD-10-CM | POA: Diagnosis not present

## 2013-09-07 DIAGNOSIS — I129 Hypertensive chronic kidney disease with stage 1 through stage 4 chronic kidney disease, or unspecified chronic kidney disease: Secondary | ICD-10-CM | POA: Diagnosis not present

## 2013-09-07 DIAGNOSIS — I509 Heart failure, unspecified: Secondary | ICD-10-CM | POA: Diagnosis not present

## 2013-09-08 DIAGNOSIS — E1129 Type 2 diabetes mellitus with other diabetic kidney complication: Secondary | ICD-10-CM | POA: Diagnosis not present

## 2013-09-08 DIAGNOSIS — Z4789 Encounter for other orthopedic aftercare: Secondary | ICD-10-CM | POA: Diagnosis not present

## 2013-09-08 DIAGNOSIS — I129 Hypertensive chronic kidney disease with stage 1 through stage 4 chronic kidney disease, or unspecified chronic kidney disease: Secondary | ICD-10-CM | POA: Diagnosis not present

## 2013-09-08 DIAGNOSIS — I509 Heart failure, unspecified: Secondary | ICD-10-CM | POA: Diagnosis not present

## 2013-09-08 DIAGNOSIS — I739 Peripheral vascular disease, unspecified: Secondary | ICD-10-CM | POA: Diagnosis not present

## 2013-09-08 DIAGNOSIS — N189 Chronic kidney disease, unspecified: Secondary | ICD-10-CM | POA: Diagnosis not present

## 2013-09-09 DIAGNOSIS — Z4789 Encounter for other orthopedic aftercare: Secondary | ICD-10-CM | POA: Diagnosis not present

## 2013-09-09 DIAGNOSIS — I739 Peripheral vascular disease, unspecified: Secondary | ICD-10-CM | POA: Diagnosis not present

## 2013-09-09 DIAGNOSIS — I129 Hypertensive chronic kidney disease with stage 1 through stage 4 chronic kidney disease, or unspecified chronic kidney disease: Secondary | ICD-10-CM | POA: Diagnosis not present

## 2013-09-09 DIAGNOSIS — N189 Chronic kidney disease, unspecified: Secondary | ICD-10-CM | POA: Diagnosis not present

## 2013-09-09 DIAGNOSIS — E1129 Type 2 diabetes mellitus with other diabetic kidney complication: Secondary | ICD-10-CM | POA: Diagnosis not present

## 2013-09-09 DIAGNOSIS — I509 Heart failure, unspecified: Secondary | ICD-10-CM | POA: Diagnosis not present

## 2013-09-11 ENCOUNTER — Telehealth: Payer: Self-pay | Admitting: Internal Medicine

## 2013-09-11 ENCOUNTER — Other Ambulatory Visit: Payer: Self-pay | Admitting: *Deleted

## 2013-09-11 DIAGNOSIS — E1129 Type 2 diabetes mellitus with other diabetic kidney complication: Secondary | ICD-10-CM | POA: Diagnosis not present

## 2013-09-11 DIAGNOSIS — I129 Hypertensive chronic kidney disease with stage 1 through stage 4 chronic kidney disease, or unspecified chronic kidney disease: Secondary | ICD-10-CM | POA: Diagnosis not present

## 2013-09-11 DIAGNOSIS — I509 Heart failure, unspecified: Secondary | ICD-10-CM | POA: Diagnosis not present

## 2013-09-11 DIAGNOSIS — Z4789 Encounter for other orthopedic aftercare: Secondary | ICD-10-CM | POA: Diagnosis not present

## 2013-09-11 DIAGNOSIS — N189 Chronic kidney disease, unspecified: Secondary | ICD-10-CM | POA: Diagnosis not present

## 2013-09-11 DIAGNOSIS — I739 Peripheral vascular disease, unspecified: Secondary | ICD-10-CM | POA: Diagnosis not present

## 2013-09-11 NOTE — Telephone Encounter (Signed)
PT  AWARE OF LAB RESULTS   IS  DOWN 3 1/2 LB  IN  3  DAYS   AND  FEELS  BETTER  NOT AS  TIGHT HAS  APPT  10-05-13 WITH DR  Tenny Craw .Zack Seal

## 2013-09-11 NOTE — Telephone Encounter (Signed)
Follow Up  Pt calling requesting lab results// waiting for a week// please call

## 2013-09-14 DIAGNOSIS — N189 Chronic kidney disease, unspecified: Secondary | ICD-10-CM | POA: Diagnosis not present

## 2013-09-14 DIAGNOSIS — I739 Peripheral vascular disease, unspecified: Secondary | ICD-10-CM | POA: Diagnosis not present

## 2013-09-14 DIAGNOSIS — I129 Hypertensive chronic kidney disease with stage 1 through stage 4 chronic kidney disease, or unspecified chronic kidney disease: Secondary | ICD-10-CM | POA: Diagnosis not present

## 2013-09-14 DIAGNOSIS — Z4789 Encounter for other orthopedic aftercare: Secondary | ICD-10-CM | POA: Diagnosis not present

## 2013-09-14 DIAGNOSIS — I509 Heart failure, unspecified: Secondary | ICD-10-CM | POA: Diagnosis not present

## 2013-09-14 DIAGNOSIS — E1129 Type 2 diabetes mellitus with other diabetic kidney complication: Secondary | ICD-10-CM | POA: Diagnosis not present

## 2013-09-14 NOTE — Telephone Encounter (Signed)
New Problem  [name] w/[facility] called requests a call back to discuss if the pt will need continual home health care for another 60 days. Please call back to discuss.

## 2013-09-14 NOTE — Telephone Encounter (Signed)
I spoke with [name] and she said that the pt is close to needing re certification if home health is going to be extended.  [name] would like to know if Dr Tenny Craw would like to extend home health.  If so [name] will need to be contacted to start the paperwork.  I will forward this message to Dr Tenny Craw for review.

## 2013-09-14 NOTE — Telephone Encounter (Signed)
I would like to extend home health.

## 2013-09-16 DIAGNOSIS — I739 Peripheral vascular disease, unspecified: Secondary | ICD-10-CM | POA: Diagnosis not present

## 2013-09-16 DIAGNOSIS — E1129 Type 2 diabetes mellitus with other diabetic kidney complication: Secondary | ICD-10-CM | POA: Diagnosis not present

## 2013-09-16 DIAGNOSIS — I509 Heart failure, unspecified: Secondary | ICD-10-CM | POA: Diagnosis not present

## 2013-09-16 DIAGNOSIS — Z4789 Encounter for other orthopedic aftercare: Secondary | ICD-10-CM | POA: Diagnosis not present

## 2013-09-16 DIAGNOSIS — N189 Chronic kidney disease, unspecified: Secondary | ICD-10-CM | POA: Diagnosis not present

## 2013-09-16 DIAGNOSIS — I129 Hypertensive chronic kidney disease with stage 1 through stage 4 chronic kidney disease, or unspecified chronic kidney disease: Secondary | ICD-10-CM | POA: Diagnosis not present

## 2013-09-16 NOTE — Telephone Encounter (Signed)
Verbal order given to [employer].

## 2013-09-17 DIAGNOSIS — I509 Heart failure, unspecified: Secondary | ICD-10-CM | POA: Diagnosis not present

## 2013-09-17 DIAGNOSIS — N189 Chronic kidney disease, unspecified: Secondary | ICD-10-CM | POA: Diagnosis not present

## 2013-09-17 DIAGNOSIS — E1129 Type 2 diabetes mellitus with other diabetic kidney complication: Secondary | ICD-10-CM | POA: Diagnosis not present

## 2013-09-17 DIAGNOSIS — I739 Peripheral vascular disease, unspecified: Secondary | ICD-10-CM | POA: Diagnosis not present

## 2013-09-17 DIAGNOSIS — I129 Hypertensive chronic kidney disease with stage 1 through stage 4 chronic kidney disease, or unspecified chronic kidney disease: Secondary | ICD-10-CM | POA: Diagnosis not present

## 2013-09-17 DIAGNOSIS — Z4789 Encounter for other orthopedic aftercare: Secondary | ICD-10-CM | POA: Diagnosis not present

## 2013-09-18 DIAGNOSIS — E1129 Type 2 diabetes mellitus with other diabetic kidney complication: Secondary | ICD-10-CM | POA: Diagnosis not present

## 2013-09-18 DIAGNOSIS — R809 Proteinuria, unspecified: Secondary | ICD-10-CM | POA: Diagnosis not present

## 2013-09-18 DIAGNOSIS — Z4789 Encounter for other orthopedic aftercare: Secondary | ICD-10-CM | POA: Diagnosis not present

## 2013-09-18 DIAGNOSIS — I509 Heart failure, unspecified: Secondary | ICD-10-CM | POA: Diagnosis not present

## 2013-09-18 DIAGNOSIS — N189 Chronic kidney disease, unspecified: Secondary | ICD-10-CM | POA: Diagnosis not present

## 2013-09-18 DIAGNOSIS — I739 Peripheral vascular disease, unspecified: Secondary | ICD-10-CM | POA: Diagnosis not present

## 2013-09-18 DIAGNOSIS — I129 Hypertensive chronic kidney disease with stage 1 through stage 4 chronic kidney disease, or unspecified chronic kidney disease: Secondary | ICD-10-CM | POA: Diagnosis not present

## 2013-09-18 DIAGNOSIS — D631 Anemia in chronic kidney disease: Secondary | ICD-10-CM | POA: Diagnosis not present

## 2013-09-21 ENCOUNTER — Encounter: Payer: Self-pay | Admitting: Vascular Surgery

## 2013-09-21 DIAGNOSIS — E1129 Type 2 diabetes mellitus with other diabetic kidney complication: Secondary | ICD-10-CM | POA: Diagnosis not present

## 2013-09-21 DIAGNOSIS — I739 Peripheral vascular disease, unspecified: Secondary | ICD-10-CM | POA: Diagnosis not present

## 2013-09-21 DIAGNOSIS — I129 Hypertensive chronic kidney disease with stage 1 through stage 4 chronic kidney disease, or unspecified chronic kidney disease: Secondary | ICD-10-CM | POA: Diagnosis not present

## 2013-09-21 DIAGNOSIS — Z4789 Encounter for other orthopedic aftercare: Secondary | ICD-10-CM | POA: Diagnosis not present

## 2013-09-21 DIAGNOSIS — N189 Chronic kidney disease, unspecified: Secondary | ICD-10-CM | POA: Diagnosis not present

## 2013-09-21 DIAGNOSIS — I509 Heart failure, unspecified: Secondary | ICD-10-CM | POA: Diagnosis not present

## 2013-09-22 ENCOUNTER — Ambulatory Visit (INDEPENDENT_AMBULATORY_CARE_PROVIDER_SITE_OTHER): Payer: Medicare Other | Admitting: Vascular Surgery

## 2013-09-22 ENCOUNTER — Encounter: Payer: Self-pay | Admitting: Vascular Surgery

## 2013-09-22 ENCOUNTER — Encounter: Payer: Self-pay | Admitting: Internal Medicine

## 2013-09-22 VITALS — BP 136/71 | HR 65 | Resp 18 | Ht 63.5 in | Wt 164.4 lb

## 2013-09-22 DIAGNOSIS — I739 Peripheral vascular disease, unspecified: Secondary | ICD-10-CM

## 2013-09-22 DIAGNOSIS — Z48812 Encounter for surgical aftercare following surgery on the circulatory system: Secondary | ICD-10-CM | POA: Diagnosis not present

## 2013-09-22 NOTE — Progress Notes (Signed)
The patient has today for followup of her a patient of her right fifth toe. Her main complaint continues to be chronic congestive heart failure. Her swelling has improved in both lower extremities although she does have pitting edema. She does have changes of chronic swelling and venous hypertension as well. She is also concerned regarding loss of her hair is questioning whether this could be related to anesthesia or antibiotics or other and I explained to be any of these. She did have a dermatologic evaluation and apparently no cause was seen.  On physical exam her sugars are well perfused. She does have a continued slow healing of her right amputation. There is approximately 3 mm x 5 mm opening at the very distal portion of the incision. There is no surrounding erythema.  Pressure and plan continued healing of her ray amputation right fifth toe. We'll continue with the hydrogel treatment of this. We will see her again in 2 months with repeat ankle arm index in duplex for lower extremity at that time

## 2013-09-22 NOTE — Addendum Note (Signed)
Addended by: Sharee Pimple on: 09/22/2013 09:28 AM   Modules accepted: Orders

## 2013-09-23 DIAGNOSIS — Z4789 Encounter for other orthopedic aftercare: Secondary | ICD-10-CM | POA: Diagnosis not present

## 2013-09-23 DIAGNOSIS — N189 Chronic kidney disease, unspecified: Secondary | ICD-10-CM | POA: Diagnosis not present

## 2013-09-23 DIAGNOSIS — I739 Peripheral vascular disease, unspecified: Secondary | ICD-10-CM | POA: Diagnosis not present

## 2013-09-23 DIAGNOSIS — E1129 Type 2 diabetes mellitus with other diabetic kidney complication: Secondary | ICD-10-CM | POA: Diagnosis not present

## 2013-09-23 DIAGNOSIS — I129 Hypertensive chronic kidney disease with stage 1 through stage 4 chronic kidney disease, or unspecified chronic kidney disease: Secondary | ICD-10-CM | POA: Diagnosis not present

## 2013-09-23 DIAGNOSIS — I509 Heart failure, unspecified: Secondary | ICD-10-CM | POA: Diagnosis not present

## 2013-09-25 DIAGNOSIS — I129 Hypertensive chronic kidney disease with stage 1 through stage 4 chronic kidney disease, or unspecified chronic kidney disease: Secondary | ICD-10-CM | POA: Diagnosis not present

## 2013-09-25 DIAGNOSIS — N189 Chronic kidney disease, unspecified: Secondary | ICD-10-CM | POA: Diagnosis not present

## 2013-09-25 DIAGNOSIS — Z4789 Encounter for other orthopedic aftercare: Secondary | ICD-10-CM | POA: Diagnosis not present

## 2013-09-25 DIAGNOSIS — I509 Heart failure, unspecified: Secondary | ICD-10-CM | POA: Diagnosis not present

## 2013-09-25 DIAGNOSIS — E1129 Type 2 diabetes mellitus with other diabetic kidney complication: Secondary | ICD-10-CM | POA: Diagnosis not present

## 2013-09-25 DIAGNOSIS — I739 Peripheral vascular disease, unspecified: Secondary | ICD-10-CM | POA: Diagnosis not present

## 2013-09-28 ENCOUNTER — Ambulatory Visit: Payer: Medicare Other

## 2013-09-28 ENCOUNTER — Telehealth: Payer: Self-pay | Admitting: Oncology

## 2013-09-28 ENCOUNTER — Telehealth: Payer: Self-pay | Admitting: *Deleted

## 2013-09-28 DIAGNOSIS — Z4789 Encounter for other orthopedic aftercare: Secondary | ICD-10-CM | POA: Diagnosis not present

## 2013-09-28 DIAGNOSIS — E1129 Type 2 diabetes mellitus with other diabetic kidney complication: Secondary | ICD-10-CM | POA: Diagnosis not present

## 2013-09-28 DIAGNOSIS — I129 Hypertensive chronic kidney disease with stage 1 through stage 4 chronic kidney disease, or unspecified chronic kidney disease: Secondary | ICD-10-CM | POA: Diagnosis not present

## 2013-09-28 DIAGNOSIS — N189 Chronic kidney disease, unspecified: Secondary | ICD-10-CM | POA: Diagnosis not present

## 2013-09-28 DIAGNOSIS — I509 Heart failure, unspecified: Secondary | ICD-10-CM | POA: Diagnosis not present

## 2013-09-28 DIAGNOSIS — I739 Peripheral vascular disease, unspecified: Secondary | ICD-10-CM | POA: Diagnosis not present

## 2013-09-28 NOTE — Telephone Encounter (Signed)
Called patient about missed appointment today.  She stated that she has a dead car battery and couldn't make it.  She said that she had called 3 hours ago to let us know and left a message.  She felt that we wouldn't get the message.   She will call back and reschedule her appointment when possible.

## 2013-09-28 NOTE — Telephone Encounter (Signed)
Pt called , returned call and r/s injection to 12/18 per pt rqst

## 2013-09-29 ENCOUNTER — Ambulatory Visit: Payer: Medicare Other | Admitting: Vascular Surgery

## 2013-09-30 ENCOUNTER — Ambulatory Visit (HOSPITAL_BASED_OUTPATIENT_CLINIC_OR_DEPARTMENT_OTHER): Payer: Medicare Other

## 2013-09-30 VITALS — BP 117/43 | HR 73 | Temp 98.2°F

## 2013-09-30 DIAGNOSIS — D518 Other vitamin B12 deficiency anemias: Secondary | ICD-10-CM | POA: Diagnosis not present

## 2013-09-30 DIAGNOSIS — Z48812 Encounter for surgical aftercare following surgery on the circulatory system: Secondary | ICD-10-CM | POA: Diagnosis not present

## 2013-09-30 DIAGNOSIS — S98139A Complete traumatic amputation of one unspecified lesser toe, initial encounter: Secondary | ICD-10-CM | POA: Diagnosis not present

## 2013-09-30 DIAGNOSIS — I509 Heart failure, unspecified: Secondary | ICD-10-CM | POA: Diagnosis not present

## 2013-09-30 DIAGNOSIS — E1129 Type 2 diabetes mellitus with other diabetic kidney complication: Secondary | ICD-10-CM | POA: Diagnosis not present

## 2013-09-30 DIAGNOSIS — N189 Chronic kidney disease, unspecified: Secondary | ICD-10-CM | POA: Diagnosis not present

## 2013-09-30 DIAGNOSIS — I129 Hypertensive chronic kidney disease with stage 1 through stage 4 chronic kidney disease, or unspecified chronic kidney disease: Secondary | ICD-10-CM | POA: Diagnosis not present

## 2013-09-30 DIAGNOSIS — I739 Peripheral vascular disease, unspecified: Secondary | ICD-10-CM | POA: Diagnosis not present

## 2013-09-30 MED ORDER — CYANOCOBALAMIN 1000 MCG/ML IJ SOLN
1000.0000 ug | Freq: Once | INTRAMUSCULAR | Status: AC
  Administered 2013-09-30: 1000 ug via INTRAMUSCULAR

## 2013-10-02 DIAGNOSIS — I129 Hypertensive chronic kidney disease with stage 1 through stage 4 chronic kidney disease, or unspecified chronic kidney disease: Secondary | ICD-10-CM | POA: Diagnosis not present

## 2013-10-02 DIAGNOSIS — Z48812 Encounter for surgical aftercare following surgery on the circulatory system: Secondary | ICD-10-CM | POA: Diagnosis not present

## 2013-10-02 DIAGNOSIS — N189 Chronic kidney disease, unspecified: Secondary | ICD-10-CM | POA: Diagnosis not present

## 2013-10-02 DIAGNOSIS — I739 Peripheral vascular disease, unspecified: Secondary | ICD-10-CM | POA: Diagnosis not present

## 2013-10-02 DIAGNOSIS — E1129 Type 2 diabetes mellitus with other diabetic kidney complication: Secondary | ICD-10-CM | POA: Diagnosis not present

## 2013-10-02 DIAGNOSIS — I509 Heart failure, unspecified: Secondary | ICD-10-CM | POA: Diagnosis not present

## 2013-10-04 DIAGNOSIS — N189 Chronic kidney disease, unspecified: Secondary | ICD-10-CM | POA: Diagnosis not present

## 2013-10-04 DIAGNOSIS — I509 Heart failure, unspecified: Secondary | ICD-10-CM | POA: Diagnosis not present

## 2013-10-04 DIAGNOSIS — I129 Hypertensive chronic kidney disease with stage 1 through stage 4 chronic kidney disease, or unspecified chronic kidney disease: Secondary | ICD-10-CM | POA: Diagnosis not present

## 2013-10-04 DIAGNOSIS — Z48812 Encounter for surgical aftercare following surgery on the circulatory system: Secondary | ICD-10-CM | POA: Diagnosis not present

## 2013-10-04 DIAGNOSIS — E1129 Type 2 diabetes mellitus with other diabetic kidney complication: Secondary | ICD-10-CM | POA: Diagnosis not present

## 2013-10-04 DIAGNOSIS — I739 Peripheral vascular disease, unspecified: Secondary | ICD-10-CM | POA: Diagnosis not present

## 2013-10-05 ENCOUNTER — Encounter: Payer: Self-pay | Admitting: Internal Medicine

## 2013-10-05 ENCOUNTER — Ambulatory Visit (INDEPENDENT_AMBULATORY_CARE_PROVIDER_SITE_OTHER): Payer: Medicare Other | Admitting: Internal Medicine

## 2013-10-05 VITALS — BP 140/57 | HR 61 | Ht 63.5 in | Wt 160.0 lb

## 2013-10-05 DIAGNOSIS — I350 Nonrheumatic aortic (valve) stenosis: Secondary | ICD-10-CM

## 2013-10-05 DIAGNOSIS — Z48812 Encounter for surgical aftercare following surgery on the circulatory system: Secondary | ICD-10-CM | POA: Diagnosis not present

## 2013-10-05 DIAGNOSIS — I359 Nonrheumatic aortic valve disorder, unspecified: Secondary | ICD-10-CM | POA: Diagnosis not present

## 2013-10-05 DIAGNOSIS — I739 Peripheral vascular disease, unspecified: Secondary | ICD-10-CM | POA: Diagnosis not present

## 2013-10-05 DIAGNOSIS — N189 Chronic kidney disease, unspecified: Secondary | ICD-10-CM | POA: Diagnosis not present

## 2013-10-05 DIAGNOSIS — E1129 Type 2 diabetes mellitus with other diabetic kidney complication: Secondary | ICD-10-CM | POA: Diagnosis not present

## 2013-10-05 DIAGNOSIS — I509 Heart failure, unspecified: Secondary | ICD-10-CM | POA: Diagnosis not present

## 2013-10-05 DIAGNOSIS — I129 Hypertensive chronic kidney disease with stage 1 through stage 4 chronic kidney disease, or unspecified chronic kidney disease: Secondary | ICD-10-CM | POA: Diagnosis not present

## 2013-10-05 NOTE — Progress Notes (Signed)
HPI Patient is a 67 yo with  a history of CAD (s/p CABG- cath in 09/2011 showed patent LIMA to LAD with occlusion of distal LAD; High grade stenosis of nondominant RCA; Signif disease of prox LCx; SVG to L PDA occluded, SVG to OM patent), Aortic stenosis, DM, dyslipidemia, POVD,   Echo 05/2013: Mild LVH, EF 30%, RVEF depressed;  mod aortic stenosis)  The patient was recently hospitalized earlier this fall for amputation of R toe and revasc of leg.  She follows with Dr Arbie Cookey  I saw her after D/C  Volume was up  Plan for slow diuresis with outpatient labs She continues to slowly improve  Edema in legs is still there but better She denies CP   Working with PT to increase stength.  Allergies  Allergen Reactions  . Adhesive [Tape] Other (See Comments)    "old Johnson/Johnson adhesive tape; takes my skin off; can use paper tape"  . Zyvox [Linezolid]     Thrombocytopenia developed to 50k after several weeks of therapy  . Bactrim [Sulfamethoxazole-Trimethoprim]     Hyperkalemia and Increased creatinine  . Cephalexin Swelling and Rash  . Ciprofloxacin Swelling and Rash  . Daptomycin     Had elevated CPK on therapy, not clear that this was due to cubicin  . Vancomycin     ? If this played role in her Acute kidney injury    Current Outpatient Prescriptions  Medication Sig Dispense Refill  . acetaminophen (TYLENOL) 325 MG tablet Take 650 mg by mouth every 6 (six) hours as needed for fever.      [name] aspirin EC 325 MG EC tablet Take 325 mg by mouth daily.       [name] atorvastatin (LIPITOR) 40 MG tablet Take 40 mg by mouth at bedtime.      . cyanocobalamin (,VITAMIN B-12,) 1000 MCG/ML injection Inject 1 mL (1,000 mcg total) into the muscle once.  1 mL  0  . docusate sodium (COLACE) 50 MG capsule Take by mouth 2 (two) times daily.      . Ferrous Sulfate (SLOW RELEASE IRON) 50 MG TBCR Take 50 mg by mouth daily.       . furosemide (LASIX) 20 MG tablet Take 80 mg by mouth daily. Pt takes 80mg  one day  alternating with 60mg  every other day      . furosemide (LASIX) 40 MG tablet Take 2 tablets (80 mg total) by mouth daily.  60 tablet  1  . glucose blood test strip Use as instructed  100 each  11  . insulin glargine (LANTUS) 100 UNIT/ML injection Inject 10-22 Units into the skin 2 (two) times daily. 10 U qhs and 22 U in AM daily      . Insulin Lispro, Human, (HUMALOG [place]) Inject into the skin. Sliding scale      . isosorbide mononitrate (IMDUR) 30 MG 24 hr tablet Take 30 mg by mouth at bedtime.       [name] levothyroxine (SYNTHROID, LEVOTHROID) 125 MCG tablet Take 1 tablet (125 mcg total) by mouth daily.  30 tablet  3  . metoprolol succinate (TOPROL-XL) 25 MG 24 hr tablet Take 1 tablet (25 mg total) by mouth daily.  30 tablet  11   No current facility-administered medications for this visit.    Past Medical History  Diagnosis Date  . HTN (hypertension)   . Hypothyroidism   . History of recurrent UTIs   . CAD (coronary artery disease)     s/p CABG x3  in 1993; last cath in 2010  . Scleroderma   . Bilateral carpal tunnel syndrome 1970s  . 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 Followed by [name]  . Claudication   . Aortic stenosis     0.8cm2 on echo in 10/2011  . Hyperlipidemia     on Lipitor  . Cat bite 2009    with MRSA; required I & D  . Heart murmur   . Blood transfusion     no reaction from transfusion; "when I had heart surgery" (08/28/2012)  . GERD (gastroesophageal reflux disease)   . CHF (congestive heart failure) Aug. 2012    Echo 10/2011: Mild LVH, EF 30%, apex akinetic, septal HK, grade 2 diastolic dysfunction, or functionally bicuspid aortic valve, mild aortic stenosis by gradient, severe by calculated AVA-suspect A. Aortic stenosis probably moderate, trivial MR, mild LAE, mild RAE.   [name] Cellulitis   . Anemia   . B12 deficiency anemia     B 12 injection "monthly since 05/2012" (08/28/2012)  . Iron deficiency  anemia 06/13/2012    "iron infusion" (08/28/2012  . Anginal pain     "history; not currently" (08/28/2012)  . Myocardial infarction 1986    "silent"  . History of bronchitis     "I've had it twice in my life" (08/28/2012)  . Exertional dyspnea     "because of the heart failure" (08/28/2012)  . Type 1 diabetes 10/1949  . Stomach ulcer 1981?    "on Tagament for ~ 1 yr" (08/28/2012)  . Seizure     ? seizure like event in 2010; workup included stress testing which led to repeat cath.   . Arthritis     "hands, hips, all over" (08/28/2012  . Osteoarthritis of both feet   . Breast cancer     left  . Cellulitis June 2013, Nov. 2013 and Feb. 14, 2014    Left leg    Past Surgical History  Procedure Laterality Date  . Carpal tunnel release  ~ 1970's    bilaterally  . Mastectomy  11/1982    Left Breast  . Vitrectomy  1990's-2004    "both eyes; I've had total of 4"  . Tubal ligation  1984  . Tonsillectomy  1952    "?adenoids"   . Appendectomy  1991  . Femoral bypass      left leg "related to transmetatarsal amputation" (08/28/2012)  . Breast biopsy with sentinel lymph node biopsy and needle localization  1984  . Coronary artery bypass graft  1992    LIMA to LAD, SVG to distal LCX & SVG to Marginal  . Cardiac catheterization  2010    LIMA to LAD patent yet with occluded distal LAD after graft insertion & retrograde is occluded. 3-4 septal perforators arise &are patent. SVG to a large marginal patent; SVG presumably to the distal dominant LCX is occluded, moderately high grade stenosis of the AV circumflex, but with distal stenoses involving a severely diseased marginal branch not amenable  to PCI  nor is inferior branch   . Cardiac catheterization  10/2011    "unsuccessful attempt of stenting" (08/28/2012)  . Cardiac catheterization  1992; 09/2011  . Breast biopsy    . Trigger finger release  1980's    right thumb  . Incision and drainage of wound  ~ 1967    "infection in left foot"  (08/28/2012)  . Incision and drainage  of wound  2009    "3 ORs on my right hand after cat bite" (08/28/2012)  . 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 Cataract extraction w/ intraocular lens  implant, bilateral  1990's  . Amputation Right 05/27/2013    Procedure: AMPUTATION DIGIT FIFTH TOE;  Surgeon: Larina Earthly, MD;  Location: [facility] OR;  Service: Vascular;  Laterality: Right;    Family History  Problem Relation Age of Onset  . Diabetes Mother     History   Social History  . Marital Status: Single    Spouse Name: N/A    Number of Children: N/A  . Years of Education: N/A   Occupational History  . Environmental health practitioner    Social History Main Topics  . Smoking status: Never Smoker   . Smokeless tobacco: Never Used  . Alcohol Use: No  . Drug Use: No  . Sexual Activity: Not Currently    Birth Control/ Protection: Post-menopausal   Other Topics Concern  . Not on file   Social History Narrative  . No narrative on file    Review of Systems:  All systems reviewed.  They are negative to the above problem except as previously stated.  Vital Signs: BP 140/57  Pulse 61  Ht 5' 3.5" (1.613 m)  Wt 160 lb (72.576 kg)  BMI 27.89 kg/m2  Physical Exam Patient is in NAD HEENT:  Normocephalic, atraumatic. EOMI, PERRLA.  Neck: JVP is normal   Lungs: clear to auscultation. No rales no wheezes.  Heart: Regular rate and rhythm. Normal S1, S2. No S3.  III/VI systolic murmr  Abdomen:  Supple, nontender. Normal bowel sounds. No masses. No hepatomegaly. Back:  ++ edema    Extremities:  Tr+ to 1+ LE edema Musculoskeletal :moving all extremities.  Neuro:   alert and oriented x3.  CN II-XII grossly intact.  EKG:  SR 61  Anteroseptal MI  T wave inversion I, AVL.   Assessment and Plan: 1.  Acute on chronic systolic CHF.  Volume status is improving  Continue to follow outpatient labs  2.  CAD  No symptoms to suggest angina.    2.  HTN Adequate  control  3.  HL  Continue meds.   4.  PVOD Contine to follow with T Early    5.  AS  Follow    6.  CKD Seen by Lajuana Carry Continue to follow labs.

## 2013-10-05 NOTE — Patient Instructions (Signed)
Your physician wants you to follow-up in: 4 MONTHS WITH DR Tenny Craw You will receive a reminder letter in the mail two months in advance. If you don't receive a letter, please call our office to schedule the follow-up appointment.   LABS WITH HOME HEALTH IN 2 WEEKS

## 2013-10-06 ENCOUNTER — Ambulatory Visit: Payer: Medicare Other | Admitting: Vascular Surgery

## 2013-10-06 ENCOUNTER — Telehealth: Payer: Self-pay | Admitting: Internal Medicine

## 2013-10-06 DIAGNOSIS — Z48812 Encounter for surgical aftercare following surgery on the circulatory system: Secondary | ICD-10-CM | POA: Diagnosis not present

## 2013-10-06 DIAGNOSIS — N189 Chronic kidney disease, unspecified: Secondary | ICD-10-CM | POA: Diagnosis not present

## 2013-10-06 DIAGNOSIS — E1129 Type 2 diabetes mellitus with other diabetic kidney complication: Secondary | ICD-10-CM | POA: Diagnosis not present

## 2013-10-06 DIAGNOSIS — I509 Heart failure, unspecified: Secondary | ICD-10-CM | POA: Diagnosis not present

## 2013-10-06 DIAGNOSIS — I739 Peripheral vascular disease, unspecified: Secondary | ICD-10-CM | POA: Diagnosis not present

## 2013-10-06 DIAGNOSIS — I129 Hypertensive chronic kidney disease with stage 1 through stage 4 chronic kidney disease, or unspecified chronic kidney disease: Secondary | ICD-10-CM | POA: Diagnosis not present

## 2013-10-06 NOTE — Telephone Encounter (Signed)
New Problem:  [name] is requesting for an order to continue with home health physical therapy for 2 times a week for 5 additional weeks. [name] states it is ok to leave a voice mail if no one answers.

## 2013-10-07 NOTE — Telephone Encounter (Signed)
Left message for [name], okay to cont physical therapy.

## 2013-10-08 ENCOUNTER — Ambulatory Visit (INDEPENDENT_AMBULATORY_CARE_PROVIDER_SITE_OTHER): Payer: Medicare Other | Admitting: Family Medicine

## 2013-10-08 VITALS — BP 140/75 | HR 85 | Temp 100.2°F | Resp 18 | Wt 160.0 lb

## 2013-10-08 DIAGNOSIS — R05 Cough: Secondary | ICD-10-CM

## 2013-10-08 DIAGNOSIS — J09X2 Influenza due to identified novel influenza A virus with other respiratory manifestations: Secondary | ICD-10-CM | POA: Diagnosis not present

## 2013-10-08 DIAGNOSIS — R6889 Other general symptoms and signs: Secondary | ICD-10-CM

## 2013-10-08 DIAGNOSIS — R059 Cough, unspecified: Secondary | ICD-10-CM | POA: Diagnosis not present

## 2013-10-08 LAB — POCT INFLUENZA A/B
Influenza A, POC: POSITIVE
Influenza B, POC: NEGATIVE

## 2013-10-08 MED ORDER — BENZONATATE 100 MG PO CAPS: 100.0000 mg | ORAL_CAPSULE | Freq: Three times a day (TID) | ORAL | Status: DC | PRN

## 2013-10-08 MED ORDER — GUAIFENESIN-CODEINE 100-6.3 MG/5ML PO SOLN: 5.0000 mL | Freq: Every evening | ORAL | Status: DC | PRN

## 2013-10-08 MED ORDER — OSELTAMIVIR PHOSPHATE 75 MG PO CAPS: 75.0000 mg | ORAL_CAPSULE | Freq: Two times a day (BID) | ORAL | Status: DC

## 2013-10-08 NOTE — Progress Notes (Signed)
Chief Complaint:  Chief Complaint  Patient presents with  . Cough  . Fever    HPI: [name] is a 67 y.o. female who is here for  1 day history of flu like symptoms Cough and fever, Took sudafed tablet last night, she was in [place] so only able to take  Tylenol.  Has not had flu vaccine. She has had breast cancer She is a brittle DM, has had CABG, and CHF, she has bilateral leg swelling. Denies weight gain, actual weight loss   Past Medical History  Diagnosis Date  . HTN (hypertension)   . Hypothyroidism   . History of recurrent UTIs   . CAD (coronary artery disease)     s/p CABG x3 in 1993; last cath in 2010  . Scleroderma   . Bilateral carpal tunnel syndrome 1970s  . 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 Followed by [name]  . Claudication   . Aortic stenosis     0.8cm2 on echo in 10/2011  . Hyperlipidemia     on Lipitor  . Cat bite 2009    with MRSA; required I & D  . Heart murmur   . Blood transfusion     no reaction from transfusion; "when I had heart surgery" (08/28/2012)  . GERD (gastroesophageal reflux disease)   . CHF (congestive heart failure) Aug. 2012    Echo 10/2011: Mild LVH, EF 30%, apex akinetic, septal HK, grade 2 diastolic dysfunction, or functionally bicuspid aortic valve, mild aortic stenosis by gradient, severe by calculated AVA-suspect A. Aortic stenosis probably moderate, trivial MR, mild LAE, mild RAE.   [name] Cellulitis   . Anemia   . B12 deficiency anemia     B 12 injection "monthly since 05/2012" (08/28/2012)  . Iron deficiency anemia 06/13/2012    "iron infusion" (08/28/2012  . Anginal pain     "history; not currently" (08/28/2012)  . Myocardial infarction 1986    "silent"  . History of bronchitis     "I've had it twice in my life" (08/28/2012)  . Exertional dyspnea     "because of the heart failure" (08/28/2012)  . Type 1 diabetes 10/1949  . Stomach ulcer  1981?    "on Tagament for ~ 1 yr" (08/28/2012)  . Seizure     ? seizure like event in 2010; workup included stress testing which led to repeat cath.   . Arthritis     "hands, hips, all over" (08/28/2012  . Osteoarthritis of both feet   . Breast cancer     left  . Cellulitis June 2013, Nov. 2013 and Feb. 14, 2014    Left leg   Past Surgical History  Procedure Laterality Date  . Carpal tunnel release  ~ 1970's    bilaterally  . Mastectomy  11/1982    Left Breast  . Vitrectomy  1990's-2004    "both eyes; I've had total of 4"  . Tubal ligation  1984  . Tonsillectomy  1952    "?adenoids"   . Appendectomy  1991  . Femoral bypass      left leg "related to transmetatarsal amputation" (08/28/2012)  . Breast biopsy with sentinel lymph node biopsy and needle localization  1984  . Coronary artery bypass graft  1992    LIMA to LAD, SVG to distal LCX & SVG to Marginal  . Cardiac catheterization  2010  LIMA to LAD patent yet with occluded distal LAD after graft insertion & retrograde is occluded. 3-4 septal perforators arise &are patent. SVG to a large marginal patent; SVG presumably to the distal dominant LCX is occluded, moderately high grade stenosis of the AV circumflex, but with distal stenoses involving a severely diseased marginal branch not amenable  to PCI  nor is inferior branch   . Cardiac catheterization  10/2011    "unsuccessful attempt of stenting" (08/28/2012)  . Cardiac catheterization  1992; 09/2011  . Breast biopsy    . Trigger finger release  1980's    right thumb  . Incision and drainage of wound  ~ 1967    "infection in left foot" (08/28/2012)  . Incision and drainage of wound  2009    "3 ORs on my right hand after cat bite" (08/28/2012)  . 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 Cataract extraction w/ intraocular lens  implant, bilateral  1990's  . Amputation Right 05/27/2013    Procedure: AMPUTATION DIGIT FIFTH TOE;  Surgeon: Larina Earthly, MD;  Location: [facility] OR;  Service: Vascular;  Laterality: Right;   History   Social History  . Marital Status: Single    Spouse Name: N/A    Number of Children: N/A  . Years of Education: N/A   Occupational History  . Environmental health practitioner    Social History Main Topics  . Smoking status: Never Smoker   . Smokeless tobacco: Never Used  . Alcohol Use: No  . Drug Use: No  . Sexual Activity: Not Currently    Birth Control/ Protection: Post-menopausal   Other Topics Concern  . None   Social History Narrative  . None   Family History  Problem Relation Age of Onset  . Diabetes Mother    Allergies  Allergen Reactions  . Adhesive [Tape] Other (See Comments)    "old Johnson/Johnson adhesive tape; takes my skin off; can use paper tape"  . Zyvox [Linezolid]     Thrombocytopenia developed to 50k after several weeks of therapy  . Bactrim [Sulfamethoxazole-Trimethoprim]     Hyperkalemia and Increased creatinine  . Cephalexin Swelling and Rash  . Ciprofloxacin Swelling and Rash  . Daptomycin     Had elevated CPK on therapy, not clear that this was due to cubicin  . Vancomycin     ? If this played role in her Acute kidney injury   Prior to Admission medications   Medication Sig Start Date End Date Taking? Authorizing Provider  aspirin EC 325 MG EC tablet Take 325 mg by mouth daily.    Yes Historical Provider, MD  atorvastatin (LIPITOR) 40 MG tablet Take 40 mg by mouth at bedtime.   Yes Historical Provider, MD  cyanocobalamin (,VITAMIN B-12,) 1000 MCG/ML injection Inject 1 mL (1,000 mcg total) into the muscle once. 07/03/13  Yes [name], MD  docusate sodium (COLACE) 50 MG capsule Take by mouth 2 (two) times daily.   Yes Historical Provider, MD  Ferrous Sulfate (SLOW RELEASE IRON) 50 MG TBCR Take 50 mg by mouth daily.    Yes Historical Provider, MD  furosemide (LASIX) 20 MG tablet Take 80 mg by mouth daily. Pt takes 80mg  one day alternating with 60mg  every other day 06/02/13   Yes Linward Headland, MD  furosemide (LASIX) 40 MG tablet Take 2 tablets (80 mg total) by mouth daily. 08/31/13  Yes Pricilla Riffle, MD  glucose blood test strip  Use as instructed 06/16/13  Yes [name], MD  insulin glargine (LANTUS) 100 UNIT/ML injection Inject 10-22 Units into the skin 2 (two) times daily. 10 U qhs and 22 U in AM daily 06/02/13  Yes Linward Headland, MD  Insulin Lispro, Human, (HUMALOG [place]) Inject into the skin. Sliding scale   Yes Historical Provider, MD  isosorbide mononitrate (IMDUR) 30 MG 24 hr tablet Take 30 mg by mouth at bedtime.    Yes Historical Provider, MD  levothyroxine (SYNTHROID, LEVOTHROID) 125 MCG tablet Take 1 tablet (125 mcg total) by mouth daily. 06/16/13  Yes [name], MD  metoprolol succinate (TOPROL-XL) 25 MG 24 hr tablet Take 1 tablet (25 mg total) by mouth daily. 07/23/13  Yes Pricilla Riffle, MD     ROS: The patient denies night sweats, unintentional weight loss, chest pain, palpitations, wheezing, dyspnea on exertion, nausea, vomiting, abdominal pain, dysuria, hematuria, melena, numbness, , or tingling.   All other systems have been reviewed and were otherwise negative with the exception of those mentioned in the HPI and as above.    PHYSICAL EXAM: Filed Vitals:   10/08/13 1643  BP: 140/75  Pulse: 85  Temp: 100.2 F (37.9 C)  Resp: 18   Filed Vitals:   10/08/13 1643  Weight: 160 lb (72.576 kg)   Body mass index is 27.89 kg/(m^2).  General: Alert, no acute distress, tired appearing HEENT:  Normocephalic, atraumatic, oropharynx patent. EOMI, PERRLA, tm nl, no exudates,  Cardiovascular:  Regular rate and rhythm, no rubs murmurs or gallops.  No Carotid bruits, radial pulse intact. No pedal edema.  Respiratory: Clear to auscultation bilaterally.  No wheezes, rales, or rhonchi.  No cyanosis, no use of accessory musculature GI: No organomegaly, abdomen is soft and non-tender, positive bowel sounds.  No masses. Skin: No rashes. Neurologic: Facial musculature  symmetric. Psychiatric: Patient is appropriate throughout our interaction. Lymphatic: No cervical lymphadenopathy Musculoskeletal: Walker   LABS: Results for orders placed in visit on 10/08/13  POCT INFLUENZA A/B      Result Value Range   Influenza A, POC Positive     Influenza B, POC Negative       EKG/XRAY:   Primary read interpreted by Dr. Conley Rolls at [facility] [address].   ASSESSMENT/PLAN: Encounter Diagnoses  Name Primary?  . Flu-like symptoms Yes  . Influenza due to identified novel influenza A virus with other respiratory manifestations   . Cough    OTC Tylenol for fever Rx Tamiflu, tessaolon perles,mucinex with codeine Pt has rx to take and get at the pharmacy in the AM, I tried calling around and no one has Tamiflu today that is open F/u prn  Gross sideeffects, risk and benefits, and alternatives of medications d/w patient. Patient is aware that all medications have potential sideeffects and we are unable to predict every sideeffect or drug-drug interaction that may occur.  Hamilton Capri PHUONG, DO 10/08/2013 5:55 PM

## 2013-10-08 NOTE — Patient Instructions (Signed)

## 2013-10-14 DIAGNOSIS — E1129 Type 2 diabetes mellitus with other diabetic kidney complication: Secondary | ICD-10-CM | POA: Diagnosis not present

## 2013-10-14 DIAGNOSIS — I739 Peripheral vascular disease, unspecified: Secondary | ICD-10-CM | POA: Diagnosis not present

## 2013-10-14 DIAGNOSIS — I129 Hypertensive chronic kidney disease with stage 1 through stage 4 chronic kidney disease, or unspecified chronic kidney disease: Secondary | ICD-10-CM | POA: Diagnosis not present

## 2013-10-14 DIAGNOSIS — I509 Heart failure, unspecified: Secondary | ICD-10-CM | POA: Diagnosis not present

## 2013-10-14 DIAGNOSIS — Z48812 Encounter for surgical aftercare following surgery on the circulatory system: Secondary | ICD-10-CM | POA: Diagnosis not present

## 2013-10-14 DIAGNOSIS — N189 Chronic kidney disease, unspecified: Secondary | ICD-10-CM | POA: Diagnosis not present

## 2013-10-15 DIAGNOSIS — N189 Chronic kidney disease, unspecified: Secondary | ICD-10-CM | POA: Diagnosis not present

## 2013-10-15 DIAGNOSIS — Z48812 Encounter for surgical aftercare following surgery on the circulatory system: Secondary | ICD-10-CM | POA: Diagnosis not present

## 2013-10-15 DIAGNOSIS — I509 Heart failure, unspecified: Secondary | ICD-10-CM | POA: Diagnosis not present

## 2013-10-15 DIAGNOSIS — I739 Peripheral vascular disease, unspecified: Secondary | ICD-10-CM | POA: Diagnosis not present

## 2013-10-15 DIAGNOSIS — E1129 Type 2 diabetes mellitus with other diabetic kidney complication: Secondary | ICD-10-CM | POA: Diagnosis not present

## 2013-10-15 DIAGNOSIS — I129 Hypertensive chronic kidney disease with stage 1 through stage 4 chronic kidney disease, or unspecified chronic kidney disease: Secondary | ICD-10-CM | POA: Diagnosis not present

## 2013-10-19 DIAGNOSIS — N189 Chronic kidney disease, unspecified: Secondary | ICD-10-CM | POA: Diagnosis not present

## 2013-10-19 DIAGNOSIS — I129 Hypertensive chronic kidney disease with stage 1 through stage 4 chronic kidney disease, or unspecified chronic kidney disease: Secondary | ICD-10-CM | POA: Diagnosis not present

## 2013-10-19 DIAGNOSIS — I739 Peripheral vascular disease, unspecified: Secondary | ICD-10-CM | POA: Diagnosis not present

## 2013-10-19 DIAGNOSIS — I509 Heart failure, unspecified: Secondary | ICD-10-CM | POA: Diagnosis not present

## 2013-10-19 DIAGNOSIS — Z48812 Encounter for surgical aftercare following surgery on the circulatory system: Secondary | ICD-10-CM | POA: Diagnosis not present

## 2013-10-19 DIAGNOSIS — E1129 Type 2 diabetes mellitus with other diabetic kidney complication: Secondary | ICD-10-CM | POA: Diagnosis not present

## 2013-10-20 DIAGNOSIS — I129 Hypertensive chronic kidney disease with stage 1 through stage 4 chronic kidney disease, or unspecified chronic kidney disease: Secondary | ICD-10-CM | POA: Diagnosis not present

## 2013-10-20 DIAGNOSIS — E1129 Type 2 diabetes mellitus with other diabetic kidney complication: Secondary | ICD-10-CM | POA: Diagnosis not present

## 2013-10-20 DIAGNOSIS — N189 Chronic kidney disease, unspecified: Secondary | ICD-10-CM | POA: Diagnosis not present

## 2013-10-20 DIAGNOSIS — Z48812 Encounter for surgical aftercare following surgery on the circulatory system: Secondary | ICD-10-CM | POA: Diagnosis not present

## 2013-10-20 DIAGNOSIS — I739 Peripheral vascular disease, unspecified: Secondary | ICD-10-CM | POA: Diagnosis not present

## 2013-10-20 DIAGNOSIS — I509 Heart failure, unspecified: Secondary | ICD-10-CM | POA: Diagnosis not present

## 2013-10-22 DIAGNOSIS — E1129 Type 2 diabetes mellitus with other diabetic kidney complication: Secondary | ICD-10-CM | POA: Diagnosis not present

## 2013-10-22 DIAGNOSIS — I129 Hypertensive chronic kidney disease with stage 1 through stage 4 chronic kidney disease, or unspecified chronic kidney disease: Secondary | ICD-10-CM | POA: Diagnosis not present

## 2013-10-22 DIAGNOSIS — I739 Peripheral vascular disease, unspecified: Secondary | ICD-10-CM | POA: Diagnosis not present

## 2013-10-22 DIAGNOSIS — Z48812 Encounter for surgical aftercare following surgery on the circulatory system: Secondary | ICD-10-CM | POA: Diagnosis not present

## 2013-10-22 DIAGNOSIS — E1165 Type 2 diabetes mellitus with hyperglycemia: Secondary | ICD-10-CM | POA: Diagnosis not present

## 2013-10-22 DIAGNOSIS — I509 Heart failure, unspecified: Secondary | ICD-10-CM | POA: Diagnosis not present

## 2013-10-22 DIAGNOSIS — N189 Chronic kidney disease, unspecified: Secondary | ICD-10-CM | POA: Diagnosis not present

## 2013-10-23 DIAGNOSIS — E1129 Type 2 diabetes mellitus with other diabetic kidney complication: Secondary | ICD-10-CM | POA: Diagnosis not present

## 2013-10-23 DIAGNOSIS — I739 Peripheral vascular disease, unspecified: Secondary | ICD-10-CM | POA: Diagnosis not present

## 2013-10-23 DIAGNOSIS — I129 Hypertensive chronic kidney disease with stage 1 through stage 4 chronic kidney disease, or unspecified chronic kidney disease: Secondary | ICD-10-CM | POA: Diagnosis not present

## 2013-10-23 DIAGNOSIS — I509 Heart failure, unspecified: Secondary | ICD-10-CM | POA: Diagnosis not present

## 2013-10-23 DIAGNOSIS — Z48812 Encounter for surgical aftercare following surgery on the circulatory system: Secondary | ICD-10-CM | POA: Diagnosis not present

## 2013-10-23 DIAGNOSIS — N189 Chronic kidney disease, unspecified: Secondary | ICD-10-CM | POA: Diagnosis not present

## 2013-10-26 ENCOUNTER — Other Ambulatory Visit: Payer: Self-pay | Admitting: Internal Medicine

## 2013-10-26 DIAGNOSIS — Z48812 Encounter for surgical aftercare following surgery on the circulatory system: Secondary | ICD-10-CM | POA: Diagnosis not present

## 2013-10-26 DIAGNOSIS — E039 Hypothyroidism, unspecified: Secondary | ICD-10-CM

## 2013-10-26 DIAGNOSIS — N189 Chronic kidney disease, unspecified: Secondary | ICD-10-CM | POA: Diagnosis not present

## 2013-10-26 DIAGNOSIS — E1129 Type 2 diabetes mellitus with other diabetic kidney complication: Secondary | ICD-10-CM | POA: Diagnosis not present

## 2013-10-26 DIAGNOSIS — I129 Hypertensive chronic kidney disease with stage 1 through stage 4 chronic kidney disease, or unspecified chronic kidney disease: Secondary | ICD-10-CM | POA: Diagnosis not present

## 2013-10-26 DIAGNOSIS — I739 Peripheral vascular disease, unspecified: Secondary | ICD-10-CM | POA: Diagnosis not present

## 2013-10-26 DIAGNOSIS — I509 Heart failure, unspecified: Secondary | ICD-10-CM | POA: Diagnosis not present

## 2013-10-28 DIAGNOSIS — I739 Peripheral vascular disease, unspecified: Secondary | ICD-10-CM | POA: Diagnosis not present

## 2013-10-28 DIAGNOSIS — E1129 Type 2 diabetes mellitus with other diabetic kidney complication: Secondary | ICD-10-CM | POA: Diagnosis not present

## 2013-10-28 DIAGNOSIS — I129 Hypertensive chronic kidney disease with stage 1 through stage 4 chronic kidney disease, or unspecified chronic kidney disease: Secondary | ICD-10-CM | POA: Diagnosis not present

## 2013-10-28 DIAGNOSIS — N189 Chronic kidney disease, unspecified: Secondary | ICD-10-CM | POA: Diagnosis not present

## 2013-10-28 DIAGNOSIS — Z48812 Encounter for surgical aftercare following surgery on the circulatory system: Secondary | ICD-10-CM | POA: Diagnosis not present

## 2013-10-28 DIAGNOSIS — I509 Heart failure, unspecified: Secondary | ICD-10-CM | POA: Diagnosis not present

## 2013-10-29 ENCOUNTER — Ambulatory Visit (HOSPITAL_BASED_OUTPATIENT_CLINIC_OR_DEPARTMENT_OTHER): Payer: Medicare Other

## 2013-10-29 ENCOUNTER — Ambulatory Visit: Payer: Medicare Other

## 2013-10-29 VITALS — BP 120/52 | HR 68 | Temp 97.4°F

## 2013-10-29 DIAGNOSIS — D518 Other vitamin B12 deficiency anemias: Secondary | ICD-10-CM | POA: Diagnosis not present

## 2013-10-29 MED ORDER — CYANOCOBALAMIN 1000 MCG/ML IJ SOLN
1000.0000 ug | Freq: Once | INTRAMUSCULAR | Status: AC
  Administered 2013-10-29: 1000 ug via INTRAMUSCULAR

## 2013-11-02 DIAGNOSIS — I509 Heart failure, unspecified: Secondary | ICD-10-CM | POA: Diagnosis not present

## 2013-11-02 DIAGNOSIS — I129 Hypertensive chronic kidney disease with stage 1 through stage 4 chronic kidney disease, or unspecified chronic kidney disease: Secondary | ICD-10-CM | POA: Diagnosis not present

## 2013-11-02 DIAGNOSIS — N189 Chronic kidney disease, unspecified: Secondary | ICD-10-CM | POA: Diagnosis not present

## 2013-11-02 DIAGNOSIS — I739 Peripheral vascular disease, unspecified: Secondary | ICD-10-CM | POA: Diagnosis not present

## 2013-11-02 DIAGNOSIS — E1129 Type 2 diabetes mellitus with other diabetic kidney complication: Secondary | ICD-10-CM | POA: Diagnosis not present

## 2013-11-02 DIAGNOSIS — Z48812 Encounter for surgical aftercare following surgery on the circulatory system: Secondary | ICD-10-CM | POA: Diagnosis not present

## 2013-11-04 DIAGNOSIS — I509 Heart failure, unspecified: Secondary | ICD-10-CM | POA: Diagnosis not present

## 2013-11-04 DIAGNOSIS — N189 Chronic kidney disease, unspecified: Secondary | ICD-10-CM | POA: Diagnosis not present

## 2013-11-04 DIAGNOSIS — Z48812 Encounter for surgical aftercare following surgery on the circulatory system: Secondary | ICD-10-CM | POA: Diagnosis not present

## 2013-11-04 DIAGNOSIS — E1129 Type 2 diabetes mellitus with other diabetic kidney complication: Secondary | ICD-10-CM | POA: Diagnosis not present

## 2013-11-04 DIAGNOSIS — I129 Hypertensive chronic kidney disease with stage 1 through stage 4 chronic kidney disease, or unspecified chronic kidney disease: Secondary | ICD-10-CM | POA: Diagnosis not present

## 2013-11-04 DIAGNOSIS — I739 Peripheral vascular disease, unspecified: Secondary | ICD-10-CM | POA: Diagnosis not present

## 2013-11-09 DIAGNOSIS — I129 Hypertensive chronic kidney disease with stage 1 through stage 4 chronic kidney disease, or unspecified chronic kidney disease: Secondary | ICD-10-CM | POA: Diagnosis not present

## 2013-11-09 DIAGNOSIS — N189 Chronic kidney disease, unspecified: Secondary | ICD-10-CM | POA: Diagnosis not present

## 2013-11-09 DIAGNOSIS — E1129 Type 2 diabetes mellitus with other diabetic kidney complication: Secondary | ICD-10-CM | POA: Diagnosis not present

## 2013-11-09 DIAGNOSIS — I739 Peripheral vascular disease, unspecified: Secondary | ICD-10-CM | POA: Diagnosis not present

## 2013-11-09 DIAGNOSIS — Z48812 Encounter for surgical aftercare following surgery on the circulatory system: Secondary | ICD-10-CM | POA: Diagnosis not present

## 2013-11-09 DIAGNOSIS — I509 Heart failure, unspecified: Secondary | ICD-10-CM | POA: Diagnosis not present

## 2013-11-10 DIAGNOSIS — I129 Hypertensive chronic kidney disease with stage 1 through stage 4 chronic kidney disease, or unspecified chronic kidney disease: Secondary | ICD-10-CM | POA: Diagnosis not present

## 2013-11-10 DIAGNOSIS — N189 Chronic kidney disease, unspecified: Secondary | ICD-10-CM | POA: Diagnosis not present

## 2013-11-10 DIAGNOSIS — Z48812 Encounter for surgical aftercare following surgery on the circulatory system: Secondary | ICD-10-CM | POA: Diagnosis not present

## 2013-11-10 DIAGNOSIS — I739 Peripheral vascular disease, unspecified: Secondary | ICD-10-CM | POA: Diagnosis not present

## 2013-11-10 DIAGNOSIS — E1129 Type 2 diabetes mellitus with other diabetic kidney complication: Secondary | ICD-10-CM | POA: Diagnosis not present

## 2013-11-10 DIAGNOSIS — E1165 Type 2 diabetes mellitus with hyperglycemia: Secondary | ICD-10-CM | POA: Diagnosis not present

## 2013-11-10 DIAGNOSIS — I509 Heart failure, unspecified: Secondary | ICD-10-CM | POA: Diagnosis not present

## 2013-11-12 DIAGNOSIS — E1129 Type 2 diabetes mellitus with other diabetic kidney complication: Secondary | ICD-10-CM | POA: Diagnosis not present

## 2013-11-12 DIAGNOSIS — N189 Chronic kidney disease, unspecified: Secondary | ICD-10-CM | POA: Diagnosis not present

## 2013-11-12 DIAGNOSIS — I129 Hypertensive chronic kidney disease with stage 1 through stage 4 chronic kidney disease, or unspecified chronic kidney disease: Secondary | ICD-10-CM | POA: Diagnosis not present

## 2013-11-12 DIAGNOSIS — I509 Heart failure, unspecified: Secondary | ICD-10-CM | POA: Diagnosis not present

## 2013-11-12 DIAGNOSIS — I739 Peripheral vascular disease, unspecified: Secondary | ICD-10-CM | POA: Diagnosis not present

## 2013-11-12 DIAGNOSIS — Z48812 Encounter for surgical aftercare following surgery on the circulatory system: Secondary | ICD-10-CM | POA: Diagnosis not present

## 2013-11-16 ENCOUNTER — Telehealth: Payer: Self-pay | Admitting: *Deleted

## 2013-11-16 ENCOUNTER — Other Ambulatory Visit: Payer: Self-pay | Admitting: Internal Medicine

## 2013-11-16 DIAGNOSIS — I509 Heart failure, unspecified: Secondary | ICD-10-CM

## 2013-11-16 DIAGNOSIS — I739 Peripheral vascular disease, unspecified: Secondary | ICD-10-CM | POA: Diagnosis not present

## 2013-11-16 DIAGNOSIS — R0989 Other specified symptoms and signs involving the circulatory and respiratory systems: Secondary | ICD-10-CM

## 2013-11-16 DIAGNOSIS — N183 Chronic kidney disease, stage 3 unspecified: Secondary | ICD-10-CM

## 2013-11-16 DIAGNOSIS — Z48812 Encounter for surgical aftercare following surgery on the circulatory system: Secondary | ICD-10-CM | POA: Diagnosis not present

## 2013-11-16 DIAGNOSIS — N189 Chronic kidney disease, unspecified: Secondary | ICD-10-CM | POA: Diagnosis not present

## 2013-11-16 DIAGNOSIS — R0609 Other forms of dyspnea: Secondary | ICD-10-CM

## 2013-11-16 DIAGNOSIS — E1129 Type 2 diabetes mellitus with other diabetic kidney complication: Secondary | ICD-10-CM | POA: Diagnosis not present

## 2013-11-16 DIAGNOSIS — I129 Hypertensive chronic kidney disease with stage 1 through stage 4 chronic kidney disease, or unspecified chronic kidney disease: Secondary | ICD-10-CM | POA: Diagnosis not present

## 2013-11-16 DIAGNOSIS — I2510 Atherosclerotic heart disease of native coronary artery without angina pectoris: Secondary | ICD-10-CM

## 2013-11-16 NOTE — Telephone Encounter (Signed)
Discussed with dr Harrington Challenger, left message for pt, she needs bmp, bnp and cbc.

## 2013-11-16 NOTE — Telephone Encounter (Signed)
Patient called asking to speak to you. She stated that she is seeing her pcp tomorrow and wants to know if there are any specific labs that will need to be ordered from Dr Harrington Challenger for her to have drawn pertaining to her lasix dose/fluid level. Thanks, MI

## 2013-11-16 NOTE — Telephone Encounter (Signed)
Spoke with pt, aware of labs needed. Tried to call the [facility] office @ [phone] to give orders and the office was closed. Orders placed in EPIC for them to see.

## 2013-11-16 NOTE — Telephone Encounter (Signed)
Patient needs order for the labs that needs to be done, please call patient and advise.

## 2013-11-17 ENCOUNTER — Encounter: Payer: Self-pay | Admitting: Dietician

## 2013-11-17 ENCOUNTER — Encounter: Payer: Self-pay | Admitting: Internal Medicine

## 2013-11-17 ENCOUNTER — Ambulatory Visit (INDEPENDENT_AMBULATORY_CARE_PROVIDER_SITE_OTHER): Payer: Medicare Other | Admitting: Internal Medicine

## 2013-11-17 VITALS — BP 130/65 | HR 80 | Temp 97.2°F | Ht 63.5 in | Wt 154.5 lb

## 2013-11-17 DIAGNOSIS — L97409 Non-pressure chronic ulcer of unspecified heel and midfoot with unspecified severity: Secondary | ICD-10-CM

## 2013-11-17 DIAGNOSIS — I10 Essential (primary) hypertension: Secondary | ICD-10-CM

## 2013-11-17 DIAGNOSIS — Z0000 Encounter for general adult medical examination without abnormal findings: Secondary | ICD-10-CM | POA: Diagnosis not present

## 2013-11-17 DIAGNOSIS — N183 Chronic kidney disease, stage 3 unspecified: Secondary | ICD-10-CM | POA: Diagnosis not present

## 2013-11-17 DIAGNOSIS — I2510 Atherosclerotic heart disease of native coronary artery without angina pectoris: Secondary | ICD-10-CM | POA: Diagnosis not present

## 2013-11-17 DIAGNOSIS — R0602 Shortness of breath: Secondary | ICD-10-CM | POA: Diagnosis not present

## 2013-11-17 DIAGNOSIS — E10621 Type 1 diabetes mellitus with foot ulcer: Secondary | ICD-10-CM

## 2013-11-17 DIAGNOSIS — D518 Other vitamin B12 deficiency anemias: Secondary | ICD-10-CM | POA: Diagnosis not present

## 2013-11-17 DIAGNOSIS — E538 Deficiency of other specified B group vitamins: Secondary | ICD-10-CM | POA: Diagnosis not present

## 2013-11-17 DIAGNOSIS — I509 Heart failure, unspecified: Secondary | ICD-10-CM

## 2013-11-17 DIAGNOSIS — T148XXA Other injury of unspecified body region, initial encounter: Secondary | ICD-10-CM | POA: Diagnosis not present

## 2013-11-17 DIAGNOSIS — E109 Type 1 diabetes mellitus without complications: Secondary | ICD-10-CM | POA: Diagnosis not present

## 2013-11-17 DIAGNOSIS — IMO0002 Reserved for concepts with insufficient information to code with codable children: Secondary | ICD-10-CM | POA: Insufficient documentation

## 2013-11-17 DIAGNOSIS — E1069 Type 1 diabetes mellitus with other specified complication: Secondary | ICD-10-CM

## 2013-11-17 LAB — BASIC METABOLIC PANEL WITH GFR
BUN: 40 mg/dL — ABNORMAL HIGH (ref 6–23)
CALCIUM: 9.4 mg/dL (ref 8.4–10.5)
CHLORIDE: 96 meq/L (ref 96–112)
CO2: 27 mEq/L (ref 19–32)
Creat: 1.23 mg/dL — ABNORMAL HIGH (ref 0.50–1.10)
GFR, Est African American: 52 mL/min — ABNORMAL LOW
GFR, Est Non African American: 45 mL/min — ABNORMAL LOW
Glucose, Bld: 426 mg/dL — ABNORMAL HIGH (ref 70–99)
Potassium: 5.1 mEq/L (ref 3.5–5.3)
SODIUM: 135 meq/L (ref 135–145)

## 2013-11-17 LAB — CBC WITH DIFFERENTIAL/PLATELET
Basophils Absolute: 0 10*3/uL (ref 0.0–0.1)
Basophils Relative: 0 % (ref 0–1)
EOS ABS: 0.3 10*3/uL (ref 0.0–0.7)
EOS PCT: 5 % (ref 0–5)
HCT: 39.7 % (ref 36.0–46.0)
HEMOGLOBIN: 13.7 g/dL (ref 12.0–15.0)
LYMPHS ABS: 0.9 10*3/uL (ref 0.7–4.0)
Lymphocytes Relative: 16 % (ref 12–46)
MCH: 31.2 pg (ref 26.0–34.0)
MCHC: 34.5 g/dL (ref 30.0–36.0)
MCV: 90.4 fL (ref 78.0–100.0)
Monocytes Absolute: 0.6 10*3/uL (ref 0.1–1.0)
Monocytes Relative: 11 % (ref 3–12)
NEUTROS PCT: 68 % (ref 43–77)
Neutro Abs: 3.9 10*3/uL (ref 1.7–7.7)
Platelets: 200 10*3/uL (ref 150–400)
RBC: 4.39 MIL/uL (ref 3.87–5.11)
RDW: 14.6 % (ref 11.5–15.5)
WBC: 5.8 10*3/uL (ref 4.0–10.5)

## 2013-11-17 LAB — PRO B NATRIURETIC PEPTIDE: Pro B Natriuretic peptide (BNP): 3606 pg/mL — ABNORMAL HIGH (ref <126)

## 2013-11-17 LAB — GLUCOSE, CAPILLARY: GLUCOSE-CAPILLARY: 416 mg/dL — AB (ref 70–99)

## 2013-11-17 LAB — POCT GLYCOSYLATED HEMOGLOBIN (HGB A1C): Hemoglobin A1C: 10

## 2013-11-17 NOTE — Patient Instructions (Addendum)
1. Please do not forget to talk to your cardiologist about the possibility of starting an ACEI, such as lisinopril.  2. Please take all medications as prescribed.  3. If you have worsening of your symptoms or new symptoms arise, please call the clinic ([phone]), or go to the ER immediately if symptoms are severe.  You have done great job in taking all your medications. I appreciate it very much. Please continue doing that.  Please bring in all your medication bottles with you in next visit.

## 2013-11-17 NOTE — Assessment & Plan Note (Addendum)
2-D echo on 05/29/13 showed EF of 30%. BW down by 11 Lbs. Clinically euvolemic. Blood pressure is normal. Patient should be been benefited from adding an ACE inhibitor given her poorly controlled diabetes. She would like to discuss with her cardiologist before making any change of her medications.  -will continue current regimen: Aspirin, metoprolol and Lasix 80 mg daily -suggested to add ACEI, pt will discuss with her cardiologist -will check ProBMP, CBC with diff and BMP per cardiologist's request

## 2013-11-17 NOTE — Assessment & Plan Note (Signed)
Patient refused health maintenance related tests or examinations, including eye examination, mammogram, colonoscopy, Zostavax vaccine, pneumococcal vaccination, flu vaccination

## 2013-11-17 NOTE — Assessment & Plan Note (Signed)
BP Readings from Last 3 Encounters:  11/17/13 130/65  10/29/13 120/52  10/08/13 140/75    Lab Results  Component Value Date   NA 135 11/17/2013   K 5.1 11/17/2013   CREATININE 1.23* 11/17/2013    Assessment: Blood pressure control: controlled Progress toward BP goal:  at goal Comments:   Plan: Medications:  continue current medications Educational resources provided: brochure Self management tools provided:   Other plans: Blood pressure is well controlled. Will continue current regimen

## 2013-11-17 NOTE — Assessment & Plan Note (Signed)
Small skin tear over the lateral side of right lower leg. Currently she is using zinc oxide ointment and HydroGel. The occupational therapist is helping her to change the dressing. No discharge or signs of infection. It is healing well.  -will continue current regimen: zinc oxide ointment and HydroGel

## 2013-11-17 NOTE — Assessment & Plan Note (Signed)
Lab Results  Component Value Date   HGBA1C 10.0 11/17/2013   HGBA1C 8.7 08/18/2013   HGBA1C 9.8 05/22/2013     Assessment: Diabetes control: poor control (HgbA1C >9%) Progress toward A1C goal:  deteriorated Comments:   Plan: Medications:  continue current medications Home glucose monitoring: Frequency:   Timing:   Instruction/counseling given: reminded to bring blood glucose meter & log to each visit Educational resources provided: brochure Self management tools provided:   Other plans: A1c was 8.7 on 08/18/13-->10.0 today. Currently she is taking Lantus 22 units in the morning and 10 U qhs, and Humalog by sliding scale insulin. Our diabetic educator suggested continuous glucose monitor, which was rejected by patient because she had multiple foot infections and is very concerned about infection. Patient has been followed up by endocrinologist, Dr. Chalmers Cater. She has an another appointment with Dr. Chalmers Cater on 12/24/13. She would like to discuss with her endocrinologist in her next visit about diabetic control. Will continue current regimen at this time.

## 2013-11-17 NOTE — Progress Notes (Signed)
Patient ID: [name], female   DOB: 11-13-1945, 68 y.o.   MRN: [id number] Subjective:   Patient ID: [name] female   DOB: 28-Oct-1945 68 y.o.   MRN: [id number]  CC:   Follow up visit.    HPI:  [name] is a 68 y.o. lady with past medical history as outlined below, who presents for a followup visit today  1. Skin tear: Patient reports that she accidentally had a skin tear over the lateral side of right lower leg last week. It is healing well. Currently she is using zinc oxide ointment and HydroGel. The occupational therapist is helping her to change the dressing. Currently she does not have fever, chills, no discharge. The size of skin tear is shrinking, currently about a dime size.   2. CHF: Her 2-D echo on 05/29/13 showed EF of 30%. She has been followed up by Dr. Harrington Challenger. She is currently taking Lasix 80 daily.  Her shortness is at her baseline. She has very mild leg edema. Her body weight decreased from 165 on 08/18/13 -->154 pounds today. Her blood pressure is normal today. She has an appointment with the Dr. Harrington Challenger in 3/14.   3. DM-I: Her recent A1c was 8.7 on 08/18/13-->10.0 today. Currently she is taking Lantus 22 units in the morning and 10 U qhs, and Humalog by sliding scale insulin. She does not have symptoms of hypo-or hyperglycemia.    4. CKD-III: Her baseline creatinine is between 1.5-2.0. Her recent creatinine was 1.29 on 09/02/13.   ROS:  Denies fever, chills, headaches,  cough, chest pain, abdominal pain, diarrhea, constipation, dysuria, urgency, frequency, hematuria.  Past Medical History  Diagnosis Date  . HTN (hypertension)   . Hypothyroidism   . History of recurrent UTIs   . CAD (coronary artery disease)     s/p CABG x3 in 1993; last cath in 2010  . Scleroderma   . Bilateral carpal tunnel syndrome 1970s  . 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 Followed by Dr. Donnetta Hutching  .  Claudication   . Aortic stenosis     0.8cm2 on echo in 10/2011  . Hyperlipidemia     on Lipitor  . Cat bite 2009    with MRSA; required I & D  . Heart murmur   . Blood transfusion     no reaction from transfusion; "when I had heart surgery" (08/28/2012)  . GERD (gastroesophageal reflux disease)   . CHF (congestive heart failure) Aug. 2012    Echo 10/2011: Mild LVH, EF 30%, apex akinetic, septal HK, grade 2 diastolic dysfunction, or functionally bicuspid aortic valve, mild aortic stenosis by gradient, severe by calculated AVA-suspect A. Aortic stenosis probably moderate, trivial MR, mild LAE, mild RAE.   [name] Cellulitis   . Anemia   . B12 deficiency anemia     B 12 injection "monthly since 05/2012" (08/28/2012)  . Iron deficiency anemia 06/13/2012    "iron infusion" (08/28/2012  . Anginal pain     "history; not currently" (08/28/2012)  . Myocardial infarction 1986    "silent"  . History of bronchitis     "I've had it twice in my life" (08/28/2012)  . Exertional dyspnea     "because of the heart failure" (08/28/2012)  . Type 1 diabetes 10/1949  . Stomach ulcer 1981?    "on Tagament for ~ 1 yr" (08/28/2012)  . Seizure     ?  seizure like event in 2010; workup included stress testing which led to repeat cath.   . Arthritis     "hands, hips, all over" (08/28/2012  . Osteoarthritis of both feet   . Breast cancer     left  . Cellulitis June 2013, Nov. 2013 and Feb. 14, 2014    Left leg   Current Outpatient Prescriptions  Medication Sig Dispense Refill  . aspirin EC 325 MG EC tablet Take 325 mg by mouth daily.       [name] atorvastatin (LIPITOR) 40 MG tablet Take 40 mg by mouth at bedtime.      . benzonatate (TESSALON PERLES) 100 MG capsule Take 1 capsule (100 mg total) by mouth 3 (three) times daily as needed for cough.  30 capsule  1  . cyanocobalamin (,VITAMIN B-12,) 1000 MCG/ML injection Inject 1 mL (1,000 mcg total) into the muscle once.  1 mL  0  . docusate sodium (COLACE) 50 MG capsule  Take by mouth 2 (two) times daily.      . Ferrous Sulfate (SLOW RELEASE IRON) 50 MG TBCR Take 50 mg by mouth daily.       . furosemide (LASIX) 20 MG tablet Take 80 mg by mouth daily. Pt takes 80mg  one day alternating with 60mg  every other day      . furosemide (LASIX) 40 MG tablet Take 2 tablets (80 mg total) by mouth daily.  60 tablet  1  . glucose blood test strip Use as instructed  100 each  11  . Guaifenesin-Codeine 100-6.3 MG/5ML SOLN Take 5 mLs by mouth at bedtime as needed.  120 mL  0  . insulin glargine (LANTUS) 100 UNIT/ML injection Inject 10-22 Units into the skin 2 (two) times daily. 10 U qhs and 22 U in AM daily      . Insulin Lispro, Human, (HUMALOG [place]) Inject into the skin. Sliding scale      . isosorbide mononitrate (IMDUR) 30 MG 24 hr tablet Take 30 mg by mouth at bedtime.       [name] levothyroxine (SYNTHROID, LEVOTHROID) 125 MCG tablet TAKE 1 TABLET (125 MCG TOTAL) BY MOUTH DAILY.  30 tablet  5  . metoprolol succinate (TOPROL-XL) 25 MG 24 hr tablet Take 1 tablet (25 mg total) by mouth daily.  30 tablet  11  . oseltamivir (TAMIFLU) 75 MG capsule Take 1 capsule (75 mg total) by mouth 2 (two) times daily.  10 capsule  0   No current facility-administered medications for this visit.   Family History  Problem Relation Age of Onset  . Diabetes Mother    History   Social History  . Marital Status: Single    Spouse Name: N/A    Number of Children: N/A  . 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 Smoking status: Never Smoker   . Smokeless tobacco: Never Used  . Alcohol Use: No  . Drug Use: No  . Sexual Activity: Not Currently    Birth Control/ Protection: Post-menopausal   Other Topics Concern  . None   Social History Narrative  . None    Review of Systems: Full 14-point review of systems otherwise negative. See HPI.   Objective:  Physical Exam: Filed Vitals:   11/17/13 1517  BP: 130/65  Pulse:  80  Temp: 97.2 F (36.2 C)  TempSrc: Oral  Height: 5' 3.5" (1.613 m)  Weight: 154 lb 8 oz (70.081  kg)  SpO2: 100%   Head: Normocephalic and atraumatic.   Eyes: Conjunctivae and EOM are normal. Pupils are equal, round, and reactive to light.   Neck: Normal range of motion. Neck supple.   Cardiovascular: Normal rate, regular rhythm and intact distal pulses.  2/6 systolic murmur over aortic area.  Pulmonary/Chest: Effort normal and breath sounds normal. No respiratory distress. She has no wheezes. She has no rales. She exhibits no tenderness.  Musculoskeletal: Normal range of motion.  Neurological: She is alert and oriented to person, place, and time. No cranial nerve deficit.   Skin: there is a small area of skin tear over the right lower leg, at lateral side, 1.2 X 1.2 cm in size, in healing process, no signs of infection or discharge.  Extremeties: 1+ pitting edema in legs. 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   Psychiatric: Affect and judgment normal   Assessment & Plan:

## 2013-11-18 ENCOUNTER — Other Ambulatory Visit: Payer: Self-pay | Admitting: *Deleted

## 2013-11-18 DIAGNOSIS — N189 Chronic kidney disease, unspecified: Secondary | ICD-10-CM | POA: Diagnosis not present

## 2013-11-18 DIAGNOSIS — E1129 Type 2 diabetes mellitus with other diabetic kidney complication: Secondary | ICD-10-CM | POA: Diagnosis not present

## 2013-11-18 DIAGNOSIS — I129 Hypertensive chronic kidney disease with stage 1 through stage 4 chronic kidney disease, or unspecified chronic kidney disease: Secondary | ICD-10-CM | POA: Diagnosis not present

## 2013-11-18 DIAGNOSIS — I739 Peripheral vascular disease, unspecified: Secondary | ICD-10-CM | POA: Diagnosis not present

## 2013-11-18 DIAGNOSIS — Z48812 Encounter for surgical aftercare following surgery on the circulatory system: Secondary | ICD-10-CM | POA: Diagnosis not present

## 2013-11-18 DIAGNOSIS — I509 Heart failure, unspecified: Secondary | ICD-10-CM | POA: Diagnosis not present

## 2013-11-18 NOTE — Progress Notes (Signed)
Case discussed with Dr. Niu soon after the resident saw the patient.  We reviewed the resident's history and exam and pertinent patient test results.  I agree with the assessment, diagnosis, and plan of care documented in the resident's note. 

## 2013-11-20 DIAGNOSIS — I129 Hypertensive chronic kidney disease with stage 1 through stage 4 chronic kidney disease, or unspecified chronic kidney disease: Secondary | ICD-10-CM | POA: Diagnosis not present

## 2013-11-20 DIAGNOSIS — I509 Heart failure, unspecified: Secondary | ICD-10-CM | POA: Diagnosis not present

## 2013-11-20 DIAGNOSIS — E1129 Type 2 diabetes mellitus with other diabetic kidney complication: Secondary | ICD-10-CM | POA: Diagnosis not present

## 2013-11-20 DIAGNOSIS — N189 Chronic kidney disease, unspecified: Secondary | ICD-10-CM | POA: Diagnosis not present

## 2013-11-20 DIAGNOSIS — I739 Peripheral vascular disease, unspecified: Secondary | ICD-10-CM | POA: Diagnosis not present

## 2013-11-20 DIAGNOSIS — Z48812 Encounter for surgical aftercare following surgery on the circulatory system: Secondary | ICD-10-CM | POA: Diagnosis not present

## 2013-11-23 ENCOUNTER — Encounter: Payer: Self-pay | Admitting: Vascular Surgery

## 2013-11-23 ENCOUNTER — Telehealth: Payer: Self-pay | Admitting: Internal Medicine

## 2013-11-23 DIAGNOSIS — E1129 Type 2 diabetes mellitus with other diabetic kidney complication: Secondary | ICD-10-CM | POA: Diagnosis not present

## 2013-11-23 DIAGNOSIS — I509 Heart failure, unspecified: Secondary | ICD-10-CM | POA: Diagnosis not present

## 2013-11-23 DIAGNOSIS — I129 Hypertensive chronic kidney disease with stage 1 through stage 4 chronic kidney disease, or unspecified chronic kidney disease: Secondary | ICD-10-CM | POA: Diagnosis not present

## 2013-11-23 DIAGNOSIS — I739 Peripheral vascular disease, unspecified: Secondary | ICD-10-CM | POA: Diagnosis not present

## 2013-11-23 DIAGNOSIS — Z48812 Encounter for surgical aftercare following surgery on the circulatory system: Secondary | ICD-10-CM | POA: Diagnosis not present

## 2013-11-23 DIAGNOSIS — N189 Chronic kidney disease, unspecified: Secondary | ICD-10-CM | POA: Diagnosis not present

## 2013-11-23 NOTE — Telephone Encounter (Signed)
Will forward for dr Harrington Challenger okay

## 2013-11-23 NOTE — Telephone Encounter (Signed)
New message     Home health will discharge pt this week---they want her to continue PT with [facility].  If OK pls fax order to [phone]

## 2013-11-24 ENCOUNTER — Ambulatory Visit (INDEPENDENT_AMBULATORY_CARE_PROVIDER_SITE_OTHER)
Admission: RE | Admit: 2013-11-24 | Discharge: 2013-11-24 | Disposition: A | Discharge status: Home / Self Care | Payer: Medicare Other | Source: Ambulatory Visit | Attending: Vascular Surgery | Admitting: Vascular Surgery

## 2013-11-24 ENCOUNTER — Encounter: Payer: Self-pay | Admitting: Vascular Surgery

## 2013-11-24 ENCOUNTER — Ambulatory Visit (HOSPITAL_COMMUNITY)
Admission: RE | Admit: 2013-11-24 | Discharge: 2013-11-24 | Disposition: A | Discharge status: Home / Self Care | Payer: Medicare Other | Source: Ambulatory Visit | Attending: Vascular Surgery | Admitting: Vascular Surgery

## 2013-11-24 ENCOUNTER — Ambulatory Visit (INDEPENDENT_AMBULATORY_CARE_PROVIDER_SITE_OTHER): Payer: Medicare Other | Admitting: Vascular Surgery

## 2013-11-24 VITALS — BP 166/58 | HR 83 | Resp 18 | Ht 63.5 in | Wt 156.6 lb

## 2013-11-24 DIAGNOSIS — I739 Peripheral vascular disease, unspecified: Secondary | ICD-10-CM | POA: Diagnosis not present

## 2013-11-24 DIAGNOSIS — I10 Essential (primary) hypertension: Secondary | ICD-10-CM | POA: Insufficient documentation

## 2013-11-24 DIAGNOSIS — E119 Type 2 diabetes mellitus without complications: Secondary | ICD-10-CM | POA: Insufficient documentation

## 2013-11-24 DIAGNOSIS — Z48812 Encounter for surgical aftercare following surgery on the circulatory system: Secondary | ICD-10-CM

## 2013-11-24 DIAGNOSIS — L98499 Non-pressure chronic ulcer of skin of other sites with unspecified severity: Principal | ICD-10-CM

## 2013-11-24 DIAGNOSIS — I70219 Atherosclerosis of native arteries of extremities with intermittent claudication, unspecified extremity: Secondary | ICD-10-CM | POA: Insufficient documentation

## 2013-11-24 DIAGNOSIS — E785 Hyperlipidemia, unspecified: Secondary | ICD-10-CM | POA: Diagnosis not present

## 2013-11-24 NOTE — Progress Notes (Signed)
Here today for continued followup of lower surety arterial insufficiency. She is status post left femoral anterior tibial bypass and right atherectomy PTA of right superficial femoral artery in August 2014 by Dr. Trula Slade and ray of dictation referred to the same time. She looks good today. She has almost completely healed the right amputation. There is a small punctate area proximate 3 mm at the distal most portion of the incision is nearly healed. She is treating this with hydrogel and a dressing. She's had no recent cardiac difficulties. She does have severe chronic bilateral lower extremity swelling with related to congestive failure and this has been somewhat controlled recently.  Past Medical History  Diagnosis Date  . HTN (hypertension)   . Hypothyroidism   . History of recurrent UTIs   . CAD (coronary artery disease)     s/p CABG x3 in 1993; last cath in 2010  . Scleroderma   . Bilateral carpal tunnel syndrome 1970s  . 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 Followed by Dr. Donnetta Hutching  . Claudication   . Aortic stenosis     0.8cm2 on echo in 10/2011  . Hyperlipidemia     on Lipitor  . Cat bite 2009    with MRSA; required I & D  . Heart murmur   . Blood transfusion     no reaction from transfusion; "when I had heart surgery" (08/28/2012)  . GERD (gastroesophageal reflux disease)   . CHF (congestive heart failure) Aug. 2012    Echo 10/2011: Mild LVH, EF 30%, apex akinetic, septal HK, grade 2 diastolic dysfunction, or functionally bicuspid aortic valve, mild aortic stenosis by gradient, severe by calculated AVA-suspect A. Aortic stenosis probably moderate, trivial MR, mild LAE, mild RAE.   [name] Cellulitis   . Anemia   . B12 deficiency anemia     B 12 injection "monthly since 05/2012" (08/28/2012)  . Iron deficiency anemia 06/13/2012    "iron infusion" (08/28/2012  . Anginal pain     "history; not currently" (08/28/2012)  .  Myocardial infarction 1986    "silent"  . History of bronchitis     "I've had it twice in my life" (08/28/2012)  . Exertional dyspnea     "because of the heart failure" (08/28/2012)  . Type 1 diabetes 10/1949  . Stomach ulcer 1981?    "on Tagament for ~ 1 yr" (08/28/2012)  . Seizure     ? seizure like event in 2010; workup included stress testing which led to repeat cath.   . Arthritis     "hands, hips, all over" (08/28/2012  . Osteoarthritis of both feet   . Breast cancer     left  . Cellulitis June 2013, Nov. 2013 and Feb. 14, 2014    Left leg    History  Substance Use Topics  . Smoking status: Never Smoker   . Smokeless tobacco: Never Used  . Alcohol Use: No    Family History  Problem Relation Age of Onset  . Diabetes Mother     Allergies  Allergen Reactions  . Adhesive [Tape] Other (See Comments)    "old Johnson[employer]Johnson adhesive tape; takes my skin off; can use paper tape"  . Zyvox [Linezolid]     Thrombocytopenia developed to 50k after several weeks of therapy  . Bactrim [Sulfamethoxazole-Trimethoprim]     Hyperkalemia and Increased creatinine  . Cephalexin Swelling and Rash  . Ciprofloxacin Swelling and  Rash  . Daptomycin     Had elevated CPK on therapy, not clear that this was due to cubicin  . Vancomycin     ? If this played role in her Acute kidney injury    Current outpatient prescriptions:aspirin EC 325 MG EC tablet, Take 325 mg by mouth daily. , Disp: , Rfl: ;  atorvastatin (LIPITOR) 40 MG tablet, Take 40 mg by mouth at bedtime., Disp: , Rfl: ;  cyanocobalamin (,VITAMIN B-12,) 1000 MCG/ML injection, Inject 1 mL (1,000 mcg total) into the muscle once., Disp: 1 mL, Rfl: 0;  docusate sodium (COLACE) 50 MG capsule, Take by mouth 2 (two) times daily., Disp: , Rfl:  Ferrous Sulfate (SLOW RELEASE IRON) 50 MG TBCR, Take 50 mg by mouth daily. , Disp: , Rfl: ;  furosemide (LASIX) 20 MG tablet, Take as directed.Pt takes 80mg  one day alternating with 60mg  every other  day, Disp: 30 tablet, Rfl: 1;  furosemide (LASIX) 40 MG tablet, TAKE 2 TABLETS (80 MG TOTAL) BY MOUTH DAILY., Disp: 60 tablet, Rfl: 1;  glucose blood test strip, Use as instructed, Disp: 100 each, Rfl: 11 Guaifenesin-Codeine 100-6.3 MG/5ML SOLN, Take 5 mLs by mouth at bedtime as needed., Disp: 120 mL, Rfl: 0;  insulin glargine (LANTUS) 100 UNIT/ML injection, Inject 10-22 Units into the skin 2 (two) times daily. 10 U qhs and 22 U in AM daily, Disp: , Rfl: ;  Insulin Lispro, Human, (HUMALOG [place]), Inject into the skin. Sliding scale, Disp: , Rfl: ;  isosorbide mononitrate (IMDUR) 30 MG 24 hr tablet, Take 30 mg by mouth at bedtime. , Disp: , Rfl:  isosorbide mononitrate (IMDUR) 30 MG 24 hr tablet, TAKE 1 TABLET (30 MG TOTAL) BY MOUTH DAILY., Disp: 30 tablet, Rfl: 2;  levothyroxine (SYNTHROID, LEVOTHROID) 125 MCG tablet, TAKE 1 TABLET (125 MCG TOTAL) BY MOUTH DAILY., Disp: 30 tablet, Rfl: 5;  metoprolol succinate (TOPROL-XL) 25 MG 24 hr tablet, Take 1 tablet (25 mg total) by mouth daily., Disp: 30 tablet, Rfl: 11  BP 166/58  Pulse 83  Resp 18  Ht 5' 3.5" (1.613 m)  Wt 156 lb 9.6 oz (71.033 kg)  BMI 27.30 kg/m2  Body mass index is 27.3 kg/(m^2).       Vascular lab studies today reveal patent femoral anterior tibial bypass with no evidence of stenosis. She does have elevated velocities to the atherectomy portion of her mid superficial artery with calcified plaque. Ankle arm index is stable at 0.64 on the right and 0.73 on the left  Impression and plan stable followup. She will continue local wound care to the area of her fifth toe amputation. We'll see her again in 3 months for continued followup of her wound

## 2013-11-25 NOTE — Telephone Encounter (Signed)
Order placed in medical records for faxing to the number provided

## 2013-11-25 NOTE — Telephone Encounter (Signed)
OK to send patient referral for PT at [facility]

## 2013-11-27 DIAGNOSIS — I739 Peripheral vascular disease, unspecified: Secondary | ICD-10-CM | POA: Diagnosis not present

## 2013-11-27 DIAGNOSIS — E1129 Type 2 diabetes mellitus with other diabetic kidney complication: Secondary | ICD-10-CM | POA: Diagnosis not present

## 2013-11-27 DIAGNOSIS — I509 Heart failure, unspecified: Secondary | ICD-10-CM | POA: Diagnosis not present

## 2013-11-27 DIAGNOSIS — I129 Hypertensive chronic kidney disease with stage 1 through stage 4 chronic kidney disease, or unspecified chronic kidney disease: Secondary | ICD-10-CM | POA: Diagnosis not present

## 2013-11-27 DIAGNOSIS — Z48812 Encounter for surgical aftercare following surgery on the circulatory system: Secondary | ICD-10-CM | POA: Diagnosis not present

## 2013-11-27 DIAGNOSIS — N189 Chronic kidney disease, unspecified: Secondary | ICD-10-CM | POA: Diagnosis not present

## 2013-12-03 ENCOUNTER — Ambulatory Visit: Payer: Medicare Other

## 2013-12-03 ENCOUNTER — Telehealth: Payer: Self-pay | Admitting: Oncology

## 2013-12-03 NOTE — Telephone Encounter (Signed)
pt called to r/s inj to tomorrow due to funeral....done

## 2013-12-04 ENCOUNTER — Ambulatory Visit (HOSPITAL_BASED_OUTPATIENT_CLINIC_OR_DEPARTMENT_OTHER): Payer: Medicare Other

## 2013-12-04 VITALS — BP 135/44 | HR 75 | Temp 98.3°F

## 2013-12-04 DIAGNOSIS — D518 Other vitamin B12 deficiency anemias: Secondary | ICD-10-CM | POA: Diagnosis not present

## 2013-12-04 MED ORDER — CYANOCOBALAMIN 1000 MCG/ML IJ SOLN
1000.0000 ug | Freq: Once | INTRAMUSCULAR | Status: AC
  Administered 2013-12-04: 1000 ug via INTRAMUSCULAR

## 2013-12-09 ENCOUNTER — Telehealth: Payer: Self-pay | Admitting: Internal Medicine

## 2013-12-09 NOTE — Telephone Encounter (Signed)
New message     Orders for pt to have physical therapy at [facility] has not been done and need lab results from 3wk ago

## 2013-12-09 NOTE — Telephone Encounter (Signed)
Spoke with pt, she has not heard from [facility] about PT. Spoke with that office. Order, demographics and last ov note faxed to [phone].  Pt aware of lab results from 11-17-13. Will forward to make sure dr Harrington Challenger has seen and call pt with any changes. Pt agreed with this plan.

## 2013-12-16 ENCOUNTER — Telehealth: Payer: Self-pay | Admitting: Internal Medicine

## 2013-12-16 NOTE — Telephone Encounter (Signed)
Spoke with [facility], they will for the note I have faxed and call me if they need anything else.

## 2013-12-16 NOTE — Telephone Encounter (Signed)
New Prob   States orders faxed over [facility] are not readable. Office says the ink is too dark to read. Please call.

## 2013-12-16 NOTE — Telephone Encounter (Signed)
Per dr Harrington Challenger, pt needs PT for mobility, weakness and recent foot surgery. Will fax this note to [facility] at [phone]. Pt made aware.

## 2013-12-21 ENCOUNTER — Other Ambulatory Visit (HOSPITAL_COMMUNITY): Payer: Medicare Other

## 2013-12-21 ENCOUNTER — Ambulatory Visit: Payer: Medicare Other | Admitting: Family

## 2013-12-21 ENCOUNTER — Encounter (HOSPITAL_COMMUNITY): Payer: Medicare Other

## 2013-12-22 ENCOUNTER — Ambulatory Visit: Payer: Medicare Other | Admitting: Neurosurgery

## 2013-12-22 DIAGNOSIS — R269 Unspecified abnormalities of gait and mobility: Secondary | ICD-10-CM | POA: Diagnosis not present

## 2013-12-23 DIAGNOSIS — E109 Type 1 diabetes mellitus without complications: Secondary | ICD-10-CM | POA: Diagnosis not present

## 2013-12-23 DIAGNOSIS — I10 Essential (primary) hypertension: Secondary | ICD-10-CM | POA: Diagnosis not present

## 2013-12-23 DIAGNOSIS — R809 Proteinuria, unspecified: Secondary | ICD-10-CM | POA: Diagnosis not present

## 2013-12-23 DIAGNOSIS — E039 Hypothyroidism, unspecified: Secondary | ICD-10-CM | POA: Diagnosis not present

## 2013-12-25 DIAGNOSIS — R269 Unspecified abnormalities of gait and mobility: Secondary | ICD-10-CM | POA: Diagnosis not present

## 2013-12-30 DIAGNOSIS — R269 Unspecified abnormalities of gait and mobility: Secondary | ICD-10-CM | POA: Diagnosis not present

## 2013-12-31 ENCOUNTER — Ambulatory Visit (HOSPITAL_BASED_OUTPATIENT_CLINIC_OR_DEPARTMENT_OTHER): Payer: Medicare Other

## 2013-12-31 VITALS — BP 123/46 | HR 57 | Temp 98.1°F

## 2013-12-31 DIAGNOSIS — E538 Deficiency of other specified B group vitamins: Secondary | ICD-10-CM | POA: Diagnosis not present

## 2013-12-31 DIAGNOSIS — D518 Other vitamin B12 deficiency anemias: Secondary | ICD-10-CM

## 2013-12-31 MED ORDER — CYANOCOBALAMIN 1000 MCG/ML IJ SOLN
1000.0000 ug | Freq: Once | INTRAMUSCULAR | Status: AC
  Administered 2013-12-31: 1000 ug via INTRAMUSCULAR

## 2014-01-01 DIAGNOSIS — R269 Unspecified abnormalities of gait and mobility: Secondary | ICD-10-CM | POA: Diagnosis not present

## 2014-01-05 DIAGNOSIS — R269 Unspecified abnormalities of gait and mobility: Secondary | ICD-10-CM | POA: Diagnosis not present

## 2014-01-08 DIAGNOSIS — R269 Unspecified abnormalities of gait and mobility: Secondary | ICD-10-CM | POA: Diagnosis not present

## 2014-01-12 ENCOUNTER — Other Ambulatory Visit: Payer: Self-pay | Admitting: Internal Medicine

## 2014-01-12 DIAGNOSIS — R269 Unspecified abnormalities of gait and mobility: Secondary | ICD-10-CM | POA: Diagnosis not present

## 2014-01-14 DIAGNOSIS — R269 Unspecified abnormalities of gait and mobility: Secondary | ICD-10-CM | POA: Diagnosis not present

## 2014-01-28 ENCOUNTER — Ambulatory Visit (HOSPITAL_BASED_OUTPATIENT_CLINIC_OR_DEPARTMENT_OTHER): Payer: Medicare Other

## 2014-01-28 VITALS — BP 142/43 | HR 67 | Temp 97.4°F

## 2014-01-28 DIAGNOSIS — E538 Deficiency of other specified B group vitamins: Secondary | ICD-10-CM | POA: Diagnosis not present

## 2014-01-28 DIAGNOSIS — D518 Other vitamin B12 deficiency anemias: Secondary | ICD-10-CM

## 2014-01-28 MED ORDER — CYANOCOBALAMIN 1000 MCG/ML IJ SOLN
1000.0000 ug | Freq: Once | INTRAMUSCULAR | Status: AC
  Administered 2014-01-28: 1000 ug via INTRAMUSCULAR

## 2014-02-02 ENCOUNTER — Telehealth: Payer: Self-pay | Admitting: Internal Medicine

## 2014-02-02 DIAGNOSIS — R0602 Shortness of breath: Secondary | ICD-10-CM

## 2014-02-02 NOTE — Telephone Encounter (Signed)
Patient would like to speak with you before her appointment on Friday. She feels that lab work should be done prior to her appt. Please call and advise.

## 2014-02-02 NOTE — Telephone Encounter (Signed)
Spoke with pt, she has recently had to stop physical therapy due to having increased SOB. She has a follow up with dr Harrington Challenger on Friday this week. The pt request blood work be done tomorrow so dr Harrington Challenger will have the results on Friday and they will be able to discuss. Will discuss with dr Harrington Challenger what labs the pt will need and place the orders. Pt agreed with this plan.

## 2014-02-03 ENCOUNTER — Telehealth: Payer: Self-pay | Admitting: Internal Medicine

## 2014-02-03 ENCOUNTER — Ambulatory Visit (INDEPENDENT_AMBULATORY_CARE_PROVIDER_SITE_OTHER): Payer: Medicare Other | Admitting: *Deleted

## 2014-02-03 DIAGNOSIS — I2510 Atherosclerotic heart disease of native coronary artery without angina pectoris: Secondary | ICD-10-CM

## 2014-02-03 DIAGNOSIS — I509 Heart failure, unspecified: Secondary | ICD-10-CM

## 2014-02-03 DIAGNOSIS — R0609 Other forms of dyspnea: Secondary | ICD-10-CM

## 2014-02-03 DIAGNOSIS — R0602 Shortness of breath: Secondary | ICD-10-CM

## 2014-02-03 DIAGNOSIS — N183 Chronic kidney disease, stage 3 unspecified: Secondary | ICD-10-CM

## 2014-02-03 DIAGNOSIS — R0989 Other specified symptoms and signs involving the circulatory and respiratory systems: Secondary | ICD-10-CM

## 2014-02-03 NOTE — Telephone Encounter (Signed)
Left message for pt on mobile, lab orders are in the system and she can go by to have drawn.

## 2014-02-03 NOTE — Telephone Encounter (Signed)
Patient is in the area. And called to check status of lab work. She would like a call ASAP.

## 2014-02-04 LAB — CBC WITH DIFFERENTIAL/PLATELET
BASOS ABS: 0 10*3/uL (ref 0.0–0.1)
Basophils Relative: 0.4 % (ref 0.0–3.0)
Eosinophils Absolute: 0.3 10*3/uL (ref 0.0–0.7)
Eosinophils Relative: 5 % (ref 0.0–5.0)
HCT: 39 % (ref 36.0–46.0)
Hemoglobin: 12.9 g/dL (ref 12.0–15.0)
Lymphocytes Relative: 13.4 % (ref 12.0–46.0)
Lymphs Abs: 0.8 10*3/uL (ref 0.7–4.0)
MCHC: 33.2 g/dL (ref 30.0–36.0)
MCV: 93.7 fl (ref 78.0–100.0)
MONOS PCT: 10.3 % (ref 3.0–12.0)
Monocytes Absolute: 0.6 10*3/uL (ref 0.1–1.0)
NEUTROS ABS: 4.4 10*3/uL (ref 1.4–7.7)
Neutrophils Relative %: 70.9 % (ref 43.0–77.0)
Platelets: 164 10*3/uL (ref 150.0–400.0)
RBC: 4.17 Mil/uL (ref 3.87–5.11)
RDW: 15 % — AB (ref 11.5–14.6)
WBC: 6.2 10*3/uL (ref 4.5–10.5)

## 2014-02-04 LAB — BASIC METABOLIC PANEL
BUN: 37 mg/dL — ABNORMAL HIGH (ref 6–23)
CO2: 27 mEq/L (ref 19–32)
CREATININE: 1.6 mg/dL — AB (ref 0.4–1.2)
Calcium: 9.3 mg/dL (ref 8.4–10.5)
Chloride: 104 mEq/L (ref 96–112)
GFR: 34.01 mL/min — ABNORMAL LOW (ref >60.00)
Glucose, Bld: 81 mg/dL (ref 70–99)
Potassium: 4.9 mEq/L (ref 3.5–5.1)
Sodium: 139 mEq/L (ref 135–145)

## 2014-02-04 LAB — BRAIN NATRIURETIC PEPTIDE: Pro B Natriuretic peptide (BNP): 678 pg/mL — ABNORMAL HIGH (ref 0.0–100.0)

## 2014-02-05 ENCOUNTER — Encounter: Payer: Self-pay | Admitting: Internal Medicine

## 2014-02-05 ENCOUNTER — Ambulatory Visit (INDEPENDENT_AMBULATORY_CARE_PROVIDER_SITE_OTHER): Payer: Medicare Other | Admitting: Internal Medicine

## 2014-02-05 VITALS — BP 150/47 | HR 55 | Ht 63.0 in | Wt 157.0 lb

## 2014-02-05 DIAGNOSIS — I509 Heart failure, unspecified: Secondary | ICD-10-CM

## 2014-02-05 DIAGNOSIS — I2510 Atherosclerotic heart disease of native coronary artery without angina pectoris: Secondary | ICD-10-CM | POA: Diagnosis not present

## 2014-02-05 DIAGNOSIS — I359 Nonrheumatic aortic valve disorder, unspecified: Secondary | ICD-10-CM

## 2014-02-05 DIAGNOSIS — I350 Nonrheumatic aortic (valve) stenosis: Secondary | ICD-10-CM

## 2014-02-05 MED ORDER — FUROSEMIDE 40 MG PO TABS: ORAL_TABLET | ORAL | Status: DC

## 2014-02-05 MED ORDER — FUROSEMIDE 20 MG PO TABS: 20.0000 mg | ORAL_TABLET | Freq: Every day | ORAL | Status: DC

## 2014-02-05 NOTE — Patient Instructions (Signed)
Your physician recommends that you continue on your current medications as directed. Please refer to the Current Medication list given to you today. Your physician recommends that you schedule a follow-up appointment in: AUGUST 2015.

## 2014-02-05 NOTE — Progress Notes (Signed)
HPI Patient is a 68 yo with  a history of CAD (s/p CABG- cath in 09/2011 showed patent LIMA to LAD with occlusion of distal LAD; High grade stenosis of nondominant RCA; Signif disease of prox LCx; SVG to L PDA occluded, SVG to OM patent), Aortic stenosis, DM, dyslipidemia, POVD,   Echo 05/2013: Mild LVH, EF 30%, RVEF depressed;  mod aortic stenosis)  The patient underwnet amputaiton of R toe in Fall  I saw her in December She has continued to go to PT   Denies CP  Breathing is OK She does say that sometimes at PT she will give out   Pushed at times too much she feels.  Allergies  Allergen Reactions  . Adhesive [Tape] Other (See Comments)    "old Johnson/Johnson adhesive tape; takes my skin off; can use paper tape"  . Zyvox [Linezolid]     Thrombocytopenia developed to 50k after several weeks of therapy  . Bactrim [Sulfamethoxazole-Trimethoprim]     Hyperkalemia and Increased creatinine  . Cephalexin Swelling and Rash  . Ciprofloxacin Swelling and Rash  . Daptomycin     Had elevated CPK on therapy, not clear that this was due to cubicin  . Vancomycin     ? If this played role in her Acute kidney injury    Current Outpatient Prescriptions  Medication Sig Dispense Refill  . aspirin EC 325 MG EC tablet Take 325 mg by mouth daily.       [name] atorvastatin (LIPITOR) 40 MG tablet Take 40 mg by mouth at bedtime.      . cyanocobalamin (,VITAMIN B-12,) 1000 MCG/ML injection Inject 1 mL (1,000 mcg total) into the muscle once.  1 mL  0  . docusate sodium (COLACE) 50 MG capsule Take by mouth 2 (two) times daily.      . furosemide (LASIX) 40 MG tablet TAKE 2 TABLETS (80 MG TOTAL) BY MOUTH DAILY.  60 tablet  1  . insulin glargine (LANTUS) 100 UNIT/ML injection Inject 10-22 Units into the skin 2 (two) times daily. 10 U qhs and 22 U in AM daily      . Insulin Lispro, Human, (HUMALOG [place]) Inject into the skin. Sliding scale      . isosorbide mononitrate (IMDUR) 30 MG 24 hr tablet TAKE 1 TABLET (30 MG TOTAL)  BY MOUTH DAILY.  30 tablet  2  . levothyroxine (SYNTHROID, LEVOTHROID) 125 MCG tablet TAKE 1 TABLET (125 MCG TOTAL) BY MOUTH DAILY.  30 tablet  5  . metoprolol succinate (TOPROL-XL) 25 MG 24 hr tablet Take 1 tablet (25 mg total) by mouth daily.  30 tablet  11   No current facility-administered medications for this visit.    Past Medical History  Diagnosis Date  . HTN (hypertension)   . Hypothyroidism   . History of recurrent UTIs   . CAD (coronary artery disease)     s/p CABG x3 in 1993; last cath in 2010  . Scleroderma   . Bilateral carpal tunnel syndrome 1970s  . 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 Followed by Dr. Donnetta Hutching  . Claudication   . Aortic stenosis     0.8cm2 on echo in 10/2011  . Hyperlipidemia     on Lipitor  . Cat bite 2009    with MRSA; required I & D  . Heart murmur   . Blood transfusion     no reaction from transfusion; "  when I had heart surgery" (08/28/2012)  . GERD (gastroesophageal reflux disease)   . CHF (congestive heart failure) Aug. 2012    Echo 10/2011: Mild LVH, EF 30%, apex akinetic, septal HK, grade 2 diastolic dysfunction, or functionally bicuspid aortic valve, mild aortic stenosis by gradient, severe by calculated AVA-suspect A. Aortic stenosis probably moderate, trivial MR, mild LAE, mild RAE.   [name] Cellulitis   . Anemia   . B12 deficiency anemia     B 12 injection "monthly since 05/2012" (08/28/2012)  . Iron deficiency anemia 06/13/2012    "iron infusion" (08/28/2012  . Anginal pain     "history; not currently" (08/28/2012)  . Myocardial infarction 1986    "silent"  . History of bronchitis     "I've had it twice in my life" (08/28/2012)  . Exertional dyspnea     "because of the heart failure" (08/28/2012)  . Type 1 diabetes 10/1949  . Stomach ulcer 1981?    "on Tagament for ~ 1 yr" (08/28/2012)  . Seizure     ? seizure like event in 2010; workup included stress testing which  led to repeat cath.   . Arthritis     "hands, hips, all over" (08/28/2012  . Osteoarthritis of both feet   . Breast cancer     left  . Cellulitis June 2013, Nov. 2013 and Feb. 14, 2014    Left leg    Past Surgical History  Procedure Laterality Date  . Carpal tunnel release  ~ 1970's    bilaterally  . Mastectomy  11/1982    Left Breast  . Vitrectomy  1990's-2004    "both eyes; I've had total of 4"  . Tubal ligation  1984  . Tonsillectomy  1952    "?adenoids"   . Appendectomy  1991  . Femoral bypass      left leg "related to transmetatarsal amputation" (08/28/2012)  . Breast biopsy with sentinel lymph node biopsy and needle localization  1984  . Coronary artery bypass graft  1992    LIMA to LAD, SVG to distal LCX & SVG to Marginal  . Cardiac catheterization  2010    LIMA to LAD patent yet with occluded distal LAD after graft insertion & retrograde is occluded. 3-4 septal perforators arise &are patent. SVG to a large marginal patent; SVG presumably to the distal dominant LCX is occluded, moderately high grade stenosis of the AV circumflex, but with distal stenoses involving a severely diseased marginal branch not amenable  to PCI  nor is inferior branch   . Cardiac catheterization  10/2011    "unsuccessful attempt of stenting" (08/28/2012)  . Cardiac catheterization  1992; 09/2011  . Breast biopsy    . Trigger finger release  1980's    right thumb  . Incision and drainage of wound  ~ 1967    "infection in left foot" (08/28/2012)  . Incision and drainage of wound  2009    "3 ORs on my right hand after cat bite" (08/28/2012)  . 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 Cataract extraction w/ intraocular lens  implant, bilateral  1990's  . Amputation Right 05/27/2013    Procedure: AMPUTATION DIGIT FIFTH TOE;  Surgeon: Rosetta Posner, MD;  Location[facility];  Service: Vascular;  Laterality: Right;    Family History  Problem Relation Age of Onset  .  Diabetes Mother     History   Social History  . Marital Status:  Single    Spouse Name: N/A    Number of Children: N/A  . 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 Smoking status: Never Smoker   . Smokeless tobacco: Never Used  . Alcohol Use: No  . Drug Use: No  . Sexual Activity: Not Currently    Birth Control/ Protection: Post-menopausal   Other Topics Concern  . Not on file   Social History Narrative  . No narrative on file    Review of Systems:  All systems reviewed.  They are negative to the above problem except as previously stated.  Vital Signs: BP 150/47  Pulse 55  Ht 5\' 3"  (1.6 m)  Wt 157 lb (71.215 kg)  BMI 27.82 kg/m2  Physical Exam Patient is in NAD HEENT:  Normocephalic, atraumatic. EOMI, PERRLA.  Neck: JVP is normal   Lungs: clear to auscultation. No rales no wheezes.  Heart: Regular rate and rhythm. Normal S1, S2. No S3.  III/VI systolic murmr  Abdomen:  Supple, nontender. Normal bowel sounds. No masses. No hepatomegaly. Back:  ++ edema    Extremities:  Tr+ to 1+ LE edema Musculoskeletal :moving all extremities.  Neuro:   alert and oriented x3.  CN II-XII grossly intact.  EKG:  SB 55 Anteroseptal MI  T wave inversion I, AVL; Biphasic V4-V6 Assessment and Plan: 1.  Acute on chronic systolic CHF.  Volume status is not bad  BNP decreased Cr up a little Will repeat BMET  2.  CAD  No symptoms to suggest angina.    2.  HTN BP a little high  Would follow  3.  HL  Continue meds.   4.  PVOD  Follow up with T Early   5.  AS  Follow    6.  CKD Repeat BMET

## 2014-02-09 ENCOUNTER — Telehealth: Payer: Self-pay | Admitting: Oncology

## 2014-02-09 NOTE — Telephone Encounter (Signed)
kk out - pt to see NG 5/22 - date per NG. s/w pt she is aware and also confirmed 5/11 lab.

## 2014-02-22 ENCOUNTER — Other Ambulatory Visit: Payer: Self-pay | Admitting: Hematology and Oncology

## 2014-02-22 ENCOUNTER — Encounter: Payer: Self-pay | Admitting: Vascular Surgery

## 2014-02-22 ENCOUNTER — Other Ambulatory Visit (HOSPITAL_BASED_OUTPATIENT_CLINIC_OR_DEPARTMENT_OTHER): Payer: Medicare Other

## 2014-02-22 DIAGNOSIS — D518 Other vitamin B12 deficiency anemias: Secondary | ICD-10-CM

## 2014-02-22 DIAGNOSIS — D509 Iron deficiency anemia, unspecified: Secondary | ICD-10-CM

## 2014-02-22 LAB — CBC & DIFF AND RETIC
BASO%: 0.5 % (ref 0.0–2.0)
BASOS ABS: 0 10*3/uL (ref 0.0–0.1)
EOS ABS: 0.3 10*3/uL (ref 0.0–0.5)
EOS%: 4.9 % (ref 0.0–7.0)
HEMATOCRIT: 39.1 % (ref 34.8–46.6)
HGB: 12.9 g/dL (ref 11.6–15.9)
IMMATURE RETIC FRACT: 3.9 % (ref 1.60–10.00)
LYMPH%: 14.4 % (ref 14.0–49.7)
MCH: 30.6 pg (ref 25.1–34.0)
MCHC: 33 g/dL (ref 31.5–36.0)
MCV: 92.9 fL (ref 79.5–101.0)
MONO#: 0.8 10*3/uL (ref 0.1–0.9)
MONO%: 13.3 % (ref 0.0–14.0)
NEUT%: 66.9 % (ref 38.4–76.8)
NEUTROS ABS: 4.1 10*3/uL (ref 1.5–6.5)
Platelets: 194 10*3/uL (ref 145–400)
RBC: 4.21 10*6/uL (ref 3.70–5.45)
RDW: 14.4 % (ref 11.2–14.5)
Retic %: 1.47 % (ref 0.70–2.10)
Retic Ct Abs: 61.89 10*3/uL (ref 33.70–90.70)
WBC: 6.1 10*3/uL (ref 3.9–10.3)
lymph#: 0.9 10*3/uL (ref 0.9–3.3)
nRBC: 0 % (ref 0–0)

## 2014-02-22 LAB — FERRITIN CHCC: FERRITIN: 63 ng/mL (ref 9–269)

## 2014-02-23 ENCOUNTER — Ambulatory Visit (INDEPENDENT_AMBULATORY_CARE_PROVIDER_SITE_OTHER): Payer: Medicare Other | Admitting: Vascular Surgery

## 2014-02-23 ENCOUNTER — Encounter: Payer: Self-pay | Admitting: Vascular Surgery

## 2014-02-23 VITALS — BP 183/76 | HR 76 | Resp 18 | Ht 63.5 in | Wt 158.2 lb

## 2014-02-23 DIAGNOSIS — L98499 Non-pressure chronic ulcer of skin of other sites with unspecified severity: Principal | ICD-10-CM

## 2014-02-23 DIAGNOSIS — I739 Peripheral vascular disease, unspecified: Secondary | ICD-10-CM

## 2014-02-23 DIAGNOSIS — Z48812 Encounter for surgical aftercare following surgery on the circulatory system: Secondary | ICD-10-CM | POA: Diagnosis not present

## 2014-02-23 NOTE — Addendum Note (Signed)
Addended by: Mena Goes on: 02/23/2014 04:02 PM   Modules accepted: Orders

## 2014-02-23 NOTE — Progress Notes (Signed)
[facility] [place] HISTORY AND PHYSICAL   CC:  F/u to 5th toe amputation  Ivor Costa, MD  HPI: This is a 68 y.o. female who is s/p left femoral to anterior tibial bypass and right atherectomy and PTA of right SFA in 05/2013 as well as ray amputation.  She states that she is doing well with her right 5th toe amputation site.  She states she had a small skin horn where her right great toenail bed is and pulled it and it bled and there is a small area of eschar, but this has not worsened.  She states that she has had some SOB and lightheadedness with exercise.  Dr. Harrington Challenger told her to do strength and endurance training and hold off on the aerobic exercise.  She states that her CHF is under pretty good control at this time.  She is on insulin for her diabetes.  She is on a statin for her cholesterol and is on medication for her HTN.  Past Medical History  Diagnosis Date  . HTN (hypertension)   . Hypothyroidism   . History of recurrent UTIs   . CAD (coronary artery disease)     s/p CABG x3 in 1993; last cath in 2010  . Scleroderma   . Bilateral carpal tunnel syndrome 1970s  . 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 Followed by Dr. Donnetta Hutching  . Claudication   . Aortic stenosis     0.8cm2 on echo in 10/2011  . Hyperlipidemia     on Lipitor  . Cat bite 2009    with MRSA; required I & D  . Heart murmur   . Blood transfusion     no reaction from transfusion; "when I had heart surgery" (08/28/2012)  . GERD (gastroesophageal reflux disease)   . CHF (congestive heart failure) Aug. 2012    Echo 10/2011: Mild LVH, EF 30%, apex akinetic, septal HK, grade 2 diastolic dysfunction, or functionally bicuspid aortic valve, mild aortic stenosis by gradient, severe by calculated AVA-suspect A. Aortic stenosis probably moderate, trivial MR, mild LAE, mild RAE.   [name] Cellulitis   . Anemia   . B12 deficiency anemia     B 12  injection "monthly since 05/2012" (08/28/2012)  . Iron deficiency anemia 06/13/2012    "iron infusion" (08/28/2012  . Anginal pain     "history; not currently" (08/28/2012)  . Myocardial infarction 1986    "silent"  . History of bronchitis     "I've had it twice in my life" (08/28/2012)  . Exertional dyspnea     "because of the heart failure" (08/28/2012)  . Type 1 diabetes 10/1949  . Stomach ulcer 1981?    "on Tagament for ~ 1 yr" (08/28/2012)  . Seizure     ? seizure like event in 2010; workup included stress testing which led to repeat cath.   . Arthritis     "hands, hips, all over" (08/28/2012  . Osteoarthritis of both feet   . Breast cancer     left  . Cellulitis June 2013, Nov. 2013 and Feb. 14, 2014    Left leg   Past Surgical History  Procedure Laterality Date  . Carpal tunnel release  ~ 1970's    bilaterally  . Mastectomy  11/1982    Left Breast  . Vitrectomy  1990's-2004    "both eyes; I've had total of 4"  . Tubal  ligation  1984  . Tonsillectomy  1952    "?adenoids"   . Appendectomy  1991  . Femoral bypass      left leg "related to transmetatarsal amputation" (08/28/2012)  . Breast biopsy with sentinel lymph node biopsy and needle localization  1984  . Coronary artery bypass graft  1992    LIMA to LAD, SVG to distal LCX & SVG to Marginal  . Cardiac catheterization  2010    LIMA to LAD patent yet with occluded distal LAD after graft insertion & retrograde is occluded. 3-4 septal perforators arise &are patent. SVG to a large marginal patent; SVG presumably to the distal dominant LCX is occluded, moderately high grade stenosis of the AV circumflex, but with distal stenoses involving a severely diseased marginal branch not amenable  to PCI  nor is inferior branch   . Cardiac catheterization  10/2011    "unsuccessful attempt of stenting" (08/28/2012)  . Cardiac catheterization  1992; 09/2011  . Breast biopsy    . Trigger finger release  1980's    right thumb  .  Incision and drainage of wound  ~ 1967    "infection in left foot" (08/28/2012)  . Incision and drainage of wound  2009    "3 ORs on my right hand after cat bite" (08/28/2012)  . 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 Cataract extraction w/ intraocular lens  implant, bilateral  1990's  . Amputation Right 05/27/2013    Procedure: AMPUTATION DIGIT FIFTH TOE;  Surgeon: Rosetta Posner, MD;  Location: [facility];  Service: Vascular;  Laterality: Right;    Allergies  Allergen Reactions  . Adhesive [Tape] Other (See Comments)    "old [employer] adhesive tape; takes my skin off; can use paper tape"  . Zyvox [Linezolid]     Thrombocytopenia developed to 50k after several weeks of therapy  . Bactrim [Sulfamethoxazole-Trimethoprim]     Hyperkalemia and Increased creatinine  . Cephalexin Swelling and Rash  . Ciprofloxacin Swelling and Rash  . Daptomycin     Had elevated CPK on therapy, not clear that this was due to cubicin  . Vancomycin     ? If this played role in her Acute kidney injury    Current Outpatient Prescriptions  Medication Sig Dispense Refill  . aspirin EC 325 MG EC tablet Take 325 mg by mouth daily.       [name] atorvastatin (LIPITOR) 40 MG tablet Take 40 mg by mouth at bedtime.      . cyanocobalamin (,VITAMIN B-12,) 1000 MCG/ML injection Inject 1 mL (1,000 mcg total) into the muscle once.  1 mL  0  . docusate sodium (COLACE) 50 MG capsule Take by mouth 2 (two) times daily.      . furosemide (LASIX) 20 MG tablet Take 1 tablet (20 mg total) by mouth daily.  30 tablet  12  . furosemide (LASIX) 40 MG tablet TAKE 2 TABLETS (80 MG TOTAL) BY MOUTH DAILY.  60 tablet  12  . insulin glargine (LANTUS) 100 UNIT/ML injection Inject 10-22 Units into the skin 2 (two) times daily. 10 U qhs and 22 U in AM daily      . Insulin Lispro, Human, (HUMALOG [place]) Inject into the skin. Sliding scale      . isosorbide mononitrate (IMDUR) 30 MG 24 hr tablet TAKE 1 TABLET (30 MG  TOTAL) BY MOUTH DAILY.  30 tablet  2  . levothyroxine (SYNTHROID, LEVOTHROID) 125 MCG  tablet TAKE 1 TABLET (125 MCG TOTAL) BY MOUTH DAILY.  30 tablet  5  . metoprolol succinate (TOPROL-XL) 25 MG 24 hr tablet Take 1 tablet (25 mg total) by mouth daily.  30 tablet  11   No current facility-administered medications for this visit.    Family History  Problem Relation Age of Onset  . Diabetes Mother     History   Social History  . Marital Status: Single    Spouse Name: N/A    Number of Children: N/A  . 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 Smoking status: Never Smoker   . Smokeless tobacco: Never Used  . Alcohol Use: No  . Drug Use: No  . Sexual Activity: Not Currently    Birth Control/ Protection: Post-menopausal   Other Topics Concern  . Not on file   Social History Narrative  . No narrative on file     ROS: [x]  Positive   [ ]  Negative   [ ]  All sytems reviewed and are negative  Cardiovascular: []  chest pain/pressure []  palpitations [x]  SOB  [x]   [x]  lightheadedness with exercise []  pain in legs while walking []  pain in feet when lying flat []  hx of DVT []  hx of phlebitis []  swelling in legs []  varicose veins  Pulmonary: []  productive cough []  asthma []  wheezing  Neurologic: []  weakness in []  arms []  legs []  numbness in []  arms []  legs [] difficulty speaking or slurred speech []  temporary loss of vision in one eye []  dizziness  Hematologic: []  bleeding problems []  problems with blood clotting easily  GI []  vomiting blood []  blood in stool  GU: []  burning with urination []  blood in urine  Psychiatric: []  hx of major depression  Integumentary: []  rashes []  ulcers  Constitutional: []  fever []  chills   PHYSICAL EXAMINATION:  Filed Vitals:   02/23/14 1439  BP: 183/76  Pulse: 76  Resp: 18   Body mass index is 27.58 kg/(m^2).  General:  WDWN in NAD Gait: Not  observed HENT: WNL, normocephalic Eyes: Pupils equal Pulmonary: normal non-labored breathing , without Rales, rhonchi,  wheezing Cardiac: RRR Abdomen: soft, NT, no masses Skin: without rashes, without ulcers  Vascular Exam/Pulses:she has a palpable right popliteal pulse.  Her distal pulses are not palpable. Extremities: without ischemic changes, without Gangrene , without cellulitis; without open wounds; her right 5th toe amputation site is essentially healed. She does have a small blister anterior and medially on the pre tibial area of the right shin. Musculoskeletal: no muscle wasting or atrophy  Neurologic: A&O X 3; Appropriate Affect ; SENSATION: normal; MOTOR FUNCTION:  moving all extremities equally. Speech is fluent/normal   Non-Invasive Vascular Imaging:  None today  Pt meds includes: Statin:  yes Beta Blocker:  yes Aspirin:  yes ACEI:  no ARB:  no Other Antiplatelet/Anticoagulant:  no   ASSESSMENT/PLAN: 68 y.o. female who is s/p left femoral to anterior tibial bypass and right atherectomy and PTA of right SFA in 05/2013 as well as ray amputation.  -she is doing well today.  Her right 5th toe amputation site is essentially healed with small area of scab.  She does have a very small eschar on her right great toe nail bed.  She will continue the hydrogel to the amputation site and the right great toenail bed.   -she will f/u in 3 months with ABI's and bilateral arterial duplex of the  lower extremities.    Leontine Locket, PA-C [facility] [phone]  Clinic MD:  Pt seen and examined in conjunction with Dr. Donnetta Hutching  I have examined the patient, reviewed and agree with above. Kidney slow healing of her patellar amputation. Separation of great toe nail. She will continue her local care and see [place] in 3 months with repeat vascular lab studies  Rosetta Posner, MD 02/23/2014 3:29 PM

## 2014-03-01 ENCOUNTER — Ambulatory Visit: Payer: Medicare Other | Admitting: Adult Health

## 2014-03-01 ENCOUNTER — Telehealth: Payer: Self-pay

## 2014-03-01 ENCOUNTER — Ambulatory Visit: Payer: Medicare Other

## 2014-03-01 DIAGNOSIS — M79609 Pain in unspecified limb: Secondary | ICD-10-CM

## 2014-03-01 DIAGNOSIS — M7989 Other specified soft tissue disorders: Secondary | ICD-10-CM

## 2014-03-01 NOTE — Telephone Encounter (Signed)
Phone call from pt.  Reports she started having pain in the right foot with weight wearing on 02/26/14.  Stated the right arch and heel area of right foot are tender.  Reported swelling of right foot from toes to right ankle.  Stated she noted redness of right foot; denies warmth.  Denies any fever/ chills.  Discussed with Dr. Trula Slade.  Recommends to schedule right lower extremity venous duplex, and to see Dr. Donnetta Hutching.

## 2014-03-01 NOTE — Telephone Encounter (Signed)
Spoke with patient to schedule for 830am of 03/02/14. Pt is agreeable to appt time, dpm

## 2014-03-02 ENCOUNTER — Encounter: Payer: Self-pay | Admitting: Vascular Surgery

## 2014-03-02 ENCOUNTER — Ambulatory Visit (HOSPITAL_COMMUNITY): Payer: Medicare Other

## 2014-03-02 ENCOUNTER — Ambulatory Visit (INDEPENDENT_AMBULATORY_CARE_PROVIDER_SITE_OTHER): Payer: Medicare Other | Admitting: Vascular Surgery

## 2014-03-02 VITALS — BP 147/49 | HR 70 | Temp 98.1°F | Ht 63.5 in | Wt 157.0 lb

## 2014-03-02 DIAGNOSIS — M79609 Pain in unspecified limb: Secondary | ICD-10-CM | POA: Diagnosis not present

## 2014-03-02 NOTE — Progress Notes (Signed)
Patient regarding a concern regarding erythema and pain in her right foot. She had just been seen on 02/23/2014. She was asked to come for noninvasive studies today for evaluation in the office visit. She was in no show for her noninvasive study. I have seen her today without studies. On listening to her foot with hand-held Doppler she does have good flow in her dorsalis pedis and posterior tibial.  Her foot does appear to be adequately perfused. She did have tenderness in the arch of her foot this is not severe today and there certainly is no evidence of fluctuance. She has had no fevers. Her fifth ray amputation site is nearly healed and she has lost a great toe nail bed with no evidence of invasive infection  I have discussed this at length the patient. I do not see any evidence of worsening ischemia. I explained that the concern would be whether she is having a deep foot infection. This does not appear to be this way clinically. She will notify should she develop any fevers or worsening symptoms. She did walk more than usual graduation on Saturday and this may simply be a relationship to that. She reports this is a better today than it was over the past 2 days. She will continue to canal and this notify should she develop any difficulty. Otherwise we'll see her in 3 months as scheduled with noninvasive followup

## 2014-03-03 ENCOUNTER — Ambulatory Visit: Payer: Medicare Other | Admitting: Oncology

## 2014-03-03 ENCOUNTER — Ambulatory Visit: Payer: Medicare Other

## 2014-03-03 ENCOUNTER — Ambulatory Visit (HOSPITAL_BASED_OUTPATIENT_CLINIC_OR_DEPARTMENT_OTHER): Payer: Medicare Other | Admitting: Hematology and Oncology

## 2014-03-03 ENCOUNTER — Encounter: Payer: Self-pay | Admitting: Hematology and Oncology

## 2014-03-03 VITALS — BP 132/42 | HR 66 | Temp 97.3°F | Resp 20 | Ht 63.5 in | Wt 156.6 lb

## 2014-03-03 DIAGNOSIS — E039 Hypothyroidism, unspecified: Secondary | ICD-10-CM | POA: Diagnosis not present

## 2014-03-03 DIAGNOSIS — D518 Other vitamin B12 deficiency anemias: Secondary | ICD-10-CM

## 2014-03-03 DIAGNOSIS — I10 Essential (primary) hypertension: Secondary | ICD-10-CM | POA: Diagnosis not present

## 2014-03-03 DIAGNOSIS — N189 Chronic kidney disease, unspecified: Secondary | ICD-10-CM

## 2014-03-03 DIAGNOSIS — I2510 Atherosclerotic heart disease of native coronary artery without angina pectoris: Secondary | ICD-10-CM | POA: Diagnosis not present

## 2014-03-03 DIAGNOSIS — D509 Iron deficiency anemia, unspecified: Secondary | ICD-10-CM

## 2014-03-03 MED ORDER — CYANOCOBALAMIN 1000 MCG/ML IJ SOLN
1000.0000 ug | Freq: Once | INTRAMUSCULAR | Status: AC
  Administered 2014-03-03: 1000 ug via INTRAMUSCULAR

## 2014-03-03 NOTE — Progress Notes (Signed)
[facility] FOLLOW-UP progress notes  Patient Care Team: Ivor Costa, MD as PCP - General (Internal Medicine) Wallace Cullens, MD (Ophthalmology)  CHIEF COMPLAINTS/PURPOSE OF VISIT:  Combined iron deficiency and B12 deficiency anemia  HISTORY OF PRESENTING ILLNESS:  [name] 68 y.o. female was transferred to my care after her prior physician has left.  I reviewed the patient's records extensive and collaborated the history with the patient. Summary of her history is as follows: This patient was noted to have combine iron deficiency and B12 deficiency in 2013. She received 1 dose of intravenous iron feraheme on 06/13/2012. Around that same time, she was started on B12 injection on a monthly basis. She also takes one oral iron supplement daily up until a month ago.  The patient denies any recent signs or symptoms of bleeding such as spontaneous epistaxis, hematuria or hematochezia. She denies any signs and symptoms of anemia. The patient takes a variety of diet. She received blood transfusion in the past for after bypass surgery. She is doing well.  MEDICAL HISTORY:  Past Medical History  Diagnosis Date  . HTN (hypertension)   . Hypothyroidism   . History of recurrent UTIs   . CAD (coronary artery disease)     s/p CABG x3 in 1993; last cath in 2010  . Scleroderma   . Bilateral carpal tunnel syndrome 1970s  . 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 Followed by Dr. Donnetta Hutching  . Claudication   . Aortic stenosis     0.8cm2 on echo in 10/2011  . Hyperlipidemia     on Lipitor  . Cat bite 2009    with MRSA; required I & D  . Heart murmur   . Blood transfusion     no reaction from transfusion; "when I had heart surgery" (08/28/2012)  . GERD (gastroesophageal reflux disease)   . CHF (congestive heart failure) Aug. 2012    Echo 10/2011: Mild LVH, EF 30%, apex akinetic, septal HK, grade 2 diastolic  dysfunction, or functionally bicuspid aortic valve, mild aortic stenosis by gradient, severe by calculated AVA-suspect A. Aortic stenosis probably moderate, trivial MR, mild LAE, mild RAE.   [name] Cellulitis   . Anemia   . B12 deficiency anemia     B 12 injection "monthly since 05/2012" (08/28/2012)  . Iron deficiency anemia 06/13/2012    "iron infusion" (08/28/2012  . Anginal pain     "history; not currently" (08/28/2012)  . Myocardial infarction 1986    "silent"  . History of bronchitis     "I've had it twice in my life" (08/28/2012)  . Exertional dyspnea     "because of the heart failure" (08/28/2012)  . Type 1 diabetes 10/1949  . Stomach ulcer 1981?    "on Tagament for ~ 1 yr" (08/28/2012)  . Seizure     ? seizure like event in 2010; workup included stress testing which led to repeat cath.   . Arthritis     "hands, hips, all over" (08/28/2012  . Osteoarthritis of both feet   . Breast cancer     left  . Cellulitis June 2013, Nov. 2013 and Feb. 14, 2014    Left leg    SURGICAL HISTORY: Past Surgical History  Procedure Laterality Date  . Carpal tunnel release  ~ 1970's    bilaterally  . Mastectomy  11/1982    Left Breast  . Vitrectomy  1990's-2004    "  both eyes; I've had total of 4"  . Tubal ligation  1984  . Tonsillectomy  1952    "?adenoids"   . Appendectomy  1991  . Femoral bypass      left leg "related to transmetatarsal amputation" (08/28/2012)  . Breast biopsy with sentinel lymph node biopsy and needle localization  1984  . Coronary artery bypass graft  1992    LIMA to LAD, SVG to distal LCX & SVG to Marginal  . Cardiac catheterization  2010    LIMA to LAD patent yet with occluded distal LAD after graft insertion & retrograde is occluded. 3-4 septal perforators arise &are patent. SVG to a large marginal patent; SVG presumably to the distal dominant LCX is occluded, moderately high grade stenosis of the AV circumflex, but with distal stenoses involving a severely  diseased marginal branch not amenable  to PCI  nor is inferior branch   . Cardiac catheterization  10/2011    "unsuccessful attempt of stenting" (08/28/2012)  . Cardiac catheterization  1992; 09/2011  . Breast biopsy    . Trigger finger release  1980's    right thumb  . Incision and drainage of wound  ~ 1967    "infection in left foot" (08/28/2012)  . Incision and drainage of wound  2009    "3 ORs on my right hand after cat bite" (08/28/2012)  . 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 Cataract extraction w/ intraocular lens  implant, bilateral  1990's  . Amputation Right 05/27/2013    Procedure: AMPUTATION DIGIT FIFTH TOE;  Surgeon: Rosetta Posner, MD;  Location: [facility];  Service: Vascular;  Laterality: Right;    SOCIAL HISTORY: History   Social History  . Marital Status: Single    Spouse Name: N/A    Number of Children: N/A  . 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 Smoking status: Never Smoker   . Smokeless tobacco: Never Used  . Alcohol Use: No  . Drug Use: No  . Sexual Activity: Not Currently    Birth Control/ Protection: Post-menopausal   Other Topics Concern  . Not on file   Social History Narrative  . No narrative on file    FAMILY HISTORY: Family History  Problem Relation Age of Onset  . Diabetes Mother     ALLERGIES:  is allergic to adhesive; zyvox; bactrim; cephalexin; ciprofloxacin; daptomycin; and vancomycin.  MEDICATIONS:  Current Outpatient Prescriptions  Medication Sig Dispense Refill  . aspirin EC 325 MG EC tablet Take 325 mg by mouth daily.       [name] atorvastatin (LIPITOR) 40 MG tablet Take 40 mg by mouth at bedtime.      . cyanocobalamin (,VITAMIN B-12,) 1000 MCG/ML injection Inject 1 mL (1,000 mcg total) into the muscle once.  1 mL  0  . docusate sodium (COLACE) 50 MG capsule Take by mouth 2 (two) times daily.      . furosemide (LASIX) 20 MG tablet  Take 60 mg by mouth every other day. 60 mg every other day alternate w/ 80 mg every other day.      . furosemide (LASIX) 40 MG tablet 80 mg every other day. TAKE 2 TABLETS (80 MG TOTAL) BY MOUTH every other day. Alternate w/ 60 mg every other day.      . insulin glargine (LANTUS) 100 UNIT/ML injection Inject 10-22 Units into the  skin 2 (two) times daily. 10 U qhs and 22 U in AM daily      . Insulin Lispro, Human, (HUMALOG [place]) Inject into the skin. Sliding scale      . isosorbide mononitrate (IMDUR) 30 MG 24 hr tablet TAKE 1 TABLET (30 MG TOTAL) BY MOUTH DAILY.  30 tablet  2  . levothyroxine (SYNTHROID, LEVOTHROID) 125 MCG tablet TAKE 1 TABLET (125 MCG TOTAL) BY MOUTH DAILY.  30 tablet  5  . metoprolol succinate (TOPROL-XL) 25 MG 24 hr tablet Take 1 tablet (25 mg total) by mouth daily.  30 tablet  11   No current facility-administered medications for this visit.    REVIEW OF SYSTEMS:   Constitutional: Denies fevers, chills or abnormal night sweats Eyes: Denies blurriness of vision, double vision or watery eyes Ears, nose, mouth, throat, and face: Denies mucositis or sore throat Respiratory: Denies cough, dyspnea or wheezes Cardiovascular: Denies palpitation, chest discomfort or lower extremity swelling Gastrointestinal:  Denies nausea, heartburn or change in bowel habits Skin: Denies abnormal skin rashes Lymphatics: Denies new lymphadenopathy or easy bruising Neurological:Denies numbness, tingling or new weaknesses Behavioral/Psych: Mood is stable, no new changes  All other systems were reviewed with the patient and are negative.  PHYSICAL EXAMINATION: ECOG PERFORMANCE STATUS: 0 - Asymptomatic  Filed Vitals:   03/03/14 1114  BP: 132/42  Pulse: 66  Temp: 97.3 F (36.3 C)  Resp: 20   Filed Weights   03/03/14 1114  Weight: 156 lb 9.6 oz (71.033 kg)    GENERAL:alert, no distress and comfortable. She is mildly obese SKIN: skin color, texture, turgor are normal, no rashes or  significant lesions. Noticed skin bruising from chronic aspirin therapy EYES: normal, conjunctiva are pink and non-injected, sclera clear PSYCH: alert & oriented x 3 with fluent speech NEURO: no focal motor/sensory deficits  LABORATORY DATA:  I have reviewed the data as listed Lab Results  Component Value Date   WBC 6.1 02/22/2014   HGB 12.9 02/22/2014   HCT 39.1 02/22/2014   MCV 92.9 02/22/2014   PLT 194 02/22/2014    Recent Labs  06/01/13 0640 06/02/13 0500  07/08/13 0943 08/18/13 1435 09/02/13 1630 11/17/13 1635 02/04/14 0757  NA 134* 134*  < > 140 137 136 135 139  K 4.8 4.9  < > 4.1 4.4 4.9 5.1 4.9  CL 106 106  < > 103 102 104 96 104  CO2 21 18*  < > 30 28 25 27 27   GLUCOSE 153* 168*  < > 134* 80 282* 426* 81  BUN 37* 36*  < > 14 29* 31* 40* 37*  CREATININE 2.02* 1.81*  < > 1.2 1.26* 1.29* 1.23* 1.6*  CALCIUM 8.7 8.7  < > 8.7 9.3 8.8 9.4 9.3  GFRNONAA 24* 28*  < >  --  44* 43* 45*  --   GFRAA 28* 32*  < >  --  51* 49* 52*  --   PROT  --   --   --  6.4  --   --   --   --   ALBUMIN 2.1* 2.1*  --  2.7*  --   --   --   --   AST  --   --   --  20  --   --   --   --   ALT  --   --   --  20  --   --   --   --   ALKPHOS  --   --   --  108  --   --   --   --   BILITOT  --   --   --  1.0  --   --   --   --   BILIDIR  --   --   --  0.2  --   --   --   --   < > = values in this interval not displayed.  ASSESSMENT & PLAN:  #1 iron deficiency  Her iron studies has been declining over the past year. I recommend resuming one iron supplement daily. I suspect the patient may have mild intermittent GI bleed versus malabsorption that causes her to have borderline iron deficiency. #2 Vitamin B12 deficiency She has received monthly vitamin B12 injections since 2013. I recommend switching the frequency of injections to every 6 months. I also gave her an option to take high-dose oral vitamin B12 supplements which has been helpful in certain patients. After much discussion this, the patient  is fairly reluctant to stop vitamin B12 injections. We'll proceed to give her one more dose of vitamin B12 injection today. I will see her back in 6 months with repeat blood work. #3 chronic kidney failure There are no signs of anemia so far. She will continue close monitoring with her various providers.  Orders Placed This Encounter  Procedures  . Ferritin    Standing Status: Future     Number of Occurrences:      Standing Expiration Date: 03/03/2015  . Vitamin B12    Standing Status: Future     Number of Occurrences:      Standing Expiration Date: 03/03/2015    All questions were answered. The patient knows to call the clinic with any problems, questions or concerns. I spent 25 minutes counseling the patient face to face. The total time spent in the appointment was 30 minutes and more than 50% was on counseling.     Heath Lark, MD 03/03/2014 2:44 PM

## 2014-03-04 ENCOUNTER — Ambulatory Visit: Payer: Medicare Other

## 2014-03-05 ENCOUNTER — Telehealth: Payer: Self-pay | Admitting: Hematology and Oncology

## 2014-03-05 NOTE — Telephone Encounter (Signed)
, °

## 2014-03-11 ENCOUNTER — Telehealth: Payer: Self-pay | Admitting: Internal Medicine

## 2014-03-11 NOTE — Telephone Encounter (Signed)
Spoke with pt, dr Harrington Challenger had given her 3 names for PCP's and she has lost the paper. ? Who would you recommend she sees.  Pt also has a friend she would like dr Harrington Challenger to see if she is taking new pts Is that okay with you.

## 2014-03-11 NOTE — Telephone Encounter (Signed)
New message    Looking for referral to PCP.  Was given two names before.

## 2014-03-13 NOTE — Telephone Encounter (Signed)
[place]

## 2014-03-15 ENCOUNTER — Encounter: Payer: Self-pay | Admitting: *Deleted

## 2014-03-15 NOTE — Telephone Encounter (Signed)
Spoke with pt, Aware of dr ross recommendation  

## 2014-04-01 ENCOUNTER — Ambulatory Visit: Payer: Medicare Other

## 2014-05-06 DIAGNOSIS — E039 Hypothyroidism, unspecified: Secondary | ICD-10-CM | POA: Diagnosis not present

## 2014-05-06 DIAGNOSIS — I10 Essential (primary) hypertension: Secondary | ICD-10-CM | POA: Diagnosis not present

## 2014-05-06 DIAGNOSIS — E109 Type 1 diabetes mellitus without complications: Secondary | ICD-10-CM | POA: Diagnosis not present

## 2014-05-06 DIAGNOSIS — R809 Proteinuria, unspecified: Secondary | ICD-10-CM | POA: Diagnosis not present

## 2014-05-21 ENCOUNTER — Ambulatory Visit (HOSPITAL_COMMUNITY)
Admission: RE | Admit: 2014-05-21 | Discharge: 2014-05-21 | Disposition: A | Discharge status: Home / Self Care | Payer: Medicare Other | Source: Ambulatory Visit | Attending: Vascular Surgery | Admitting: Vascular Surgery

## 2014-05-21 ENCOUNTER — Ambulatory Visit (INDEPENDENT_AMBULATORY_CARE_PROVIDER_SITE_OTHER)
Admission: RE | Admit: 2014-05-21 | Discharge: 2014-05-21 | Disposition: A | Discharge status: Home / Self Care | Payer: Medicare Other | Source: Ambulatory Visit | Attending: Vascular Surgery | Admitting: Vascular Surgery

## 2014-05-21 DIAGNOSIS — Z48812 Encounter for surgical aftercare following surgery on the circulatory system: Secondary | ICD-10-CM

## 2014-05-21 DIAGNOSIS — I739 Peripheral vascular disease, unspecified: Secondary | ICD-10-CM | POA: Insufficient documentation

## 2014-05-21 DIAGNOSIS — L98499 Non-pressure chronic ulcer of skin of other sites with unspecified severity: Principal | ICD-10-CM | POA: Insufficient documentation

## 2014-05-24 ENCOUNTER — Encounter: Payer: Self-pay | Admitting: Vascular Surgery

## 2014-05-24 ENCOUNTER — Other Ambulatory Visit: Payer: Self-pay | Admitting: Internal Medicine

## 2014-05-25 ENCOUNTER — Ambulatory Visit (INDEPENDENT_AMBULATORY_CARE_PROVIDER_SITE_OTHER): Payer: Medicare Other | Admitting: Vascular Surgery

## 2014-05-25 ENCOUNTER — Encounter: Payer: Self-pay | Admitting: Vascular Surgery

## 2014-05-25 VITALS — BP 157/50 | HR 67 | Resp 18 | Ht 63.5 in | Wt 157.1 lb

## 2014-05-25 DIAGNOSIS — I2510 Atherosclerotic heart disease of native coronary artery without angina pectoris: Secondary | ICD-10-CM

## 2014-05-25 DIAGNOSIS — I739 Peripheral vascular disease, unspecified: Secondary | ICD-10-CM | POA: Diagnosis not present

## 2014-05-25 DIAGNOSIS — L98499 Non-pressure chronic ulcer of skin of other sites with unspecified severity: Principal | ICD-10-CM

## 2014-05-25 DIAGNOSIS — Z48812 Encounter for surgical aftercare following surgery on the circulatory system: Secondary | ICD-10-CM

## 2014-05-25 NOTE — Addendum Note (Signed)
Addended by: Mena Goes on: 05/25/2014 02:37 PM   Modules accepted: Orders

## 2014-05-25 NOTE — Progress Notes (Signed)
The patient presents today for continued followup of her vascular occlusive disease. She looks quite good today. She is walking without difficulty and reports that she is limited more by her shortness of breath and she is in the claudication type symptoms. She has had no new major cardiac difficulty. She has no lower extremity tissue loss.  Past Medical History  Diagnosis Date  . HTN (hypertension)   . Hypothyroidism   . History of recurrent UTIs   . CAD (coronary artery disease)     s/p CABG x3 in 1993; last cath in 2010  . Scleroderma   . Bilateral carpal tunnel syndrome 1970s  . 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 Followed by Dr. Donnetta Hutching  . Claudication   . Aortic stenosis     0.8cm2 on echo in 10/2011  . Hyperlipidemia     on Lipitor  . Cat bite 2009    with MRSA; required I & D  . Heart murmur   . Blood transfusion     no reaction from transfusion; "when I had heart surgery" (08/28/2012)  . GERD (gastroesophageal reflux disease)   . CHF (congestive heart failure) Aug. 2012    Echo 10/2011: Mild LVH, EF 30%, apex akinetic, septal HK, grade 2 diastolic dysfunction, or functionally bicuspid aortic valve, mild aortic stenosis by gradient, severe by calculated AVA-suspect A. Aortic stenosis probably moderate, trivial MR, mild LAE, mild RAE.   [name] Cellulitis   . Anemia   . B12 deficiency anemia     B 12 injection "monthly since 05/2012" (08/28/2012)  . Iron deficiency anemia 06/13/2012    "iron infusion" (08/28/2012  . Anginal pain     "history; not currently" (08/28/2012)  . Myocardial infarction 1986    "silent"  . History of bronchitis     "I've had it twice in my life" (08/28/2012)  . Exertional dyspnea     "because of the heart failure" (08/28/2012)  . Type 1 diabetes 10/1949  . Stomach ulcer 1981?    "on Tagament for ~ 1 yr" (08/28/2012)  . Seizure     ? seizure like event in 2010; workup included stress  testing which led to repeat cath.   . Arthritis     "hands, hips, all over" (08/28/2012  . Osteoarthritis of both feet   . Breast cancer     left  . Cellulitis June 2013, Nov. 2013 and Feb. 14, 2014    Left leg    History  Substance Use Topics  . Smoking status: Never Smoker   . Smokeless tobacco: Never Used  . Alcohol Use: No    Family History  Problem Relation Age of Onset  . Diabetes Mother     Allergies  Allergen Reactions  . Adhesive [Tape] Other (See Comments)    "old Johnson/Johnson adhesive tape; takes my skin off; can use paper tape"  . Zyvox [Linezolid]     Thrombocytopenia developed to 50k after several weeks of therapy  . Bactrim [Sulfamethoxazole-Trimethoprim]     Hyperkalemia and Increased creatinine  . Cephalexin Swelling and Rash  . Ciprofloxacin Swelling and Rash  . Daptomycin     Had elevated CPK on therapy, not clear that this was due to cubicin  . Vancomycin     ? If this played role in her Acute kidney injury    Current outpatient prescriptions:aspirin EC 325 MG EC tablet, Take 325 mg by mouth  daily. , Disp: , Rfl: ;  atorvastatin (LIPITOR) 40 MG tablet, Take 40 mg by mouth at bedtime., Disp: , Rfl: ;  cyanocobalamin (,VITAMIN B-12,) 1000 MCG/ML injection, Inject 1 mL (1,000 mcg total) into the muscle once., Disp: 1 mL, Rfl: 0;  docusate sodium (COLACE) 50 MG capsule, Take by mouth 2 (two) times daily., Disp: , Rfl:  furosemide (LASIX) 20 MG tablet, Take 60 mg by mouth every other day. 60 mg every other day alternate w/ 80 mg every other day., Disp: , Rfl: ;  furosemide (LASIX) 40 MG tablet, 80 mg every other day. TAKE 2 TABLETS (80 MG TOTAL) BY MOUTH every other day. Alternate w/ 60 mg every other day., Disp: , Rfl:  insulin glargine (LANTUS) 100 UNIT/ML injection, Inject 10-22 Units into the skin 2 (two) times daily. 10 U qhs and 22 U in AM daily, Disp: , Rfl: ;  Insulin Lispro, Human, (HUMALOG [place]), Inject into the skin. Sliding scale, Disp: , Rfl: ;   isosorbide mononitrate (IMDUR) 30 MG 24 hr tablet, TAKE 1 TABLET (30 MG TOTAL) BY MOUTH DAILY., Disp: 30 tablet, Rfl: 2 levothyroxine (SYNTHROID, LEVOTHROID) 125 MCG tablet, TAKE 1 TABLET (125 MCG TOTAL) BY MOUTH DAILY., Disp: 30 tablet, Rfl: 5;  metoprolol succinate (TOPROL-XL) 25 MG 24 hr tablet, Take 1 tablet (25 mg total) by mouth daily., Disp: 30 tablet, Rfl: 11  BP 157/50  Pulse 67  Resp 18  Ht 5' 3.5" (1.613 m)  Wt 157 lb 1.6 oz (71.26 kg)  BMI 27.39 kg/m2  Body mass index is 27.39 kg/(m^2).       Physical exam well-developed well-nourished white female in no acute distress She does have an easily palpable left femoral to anterior tibial graft pulse of the lateral aspect of her knee. No palpable pulses on the right and no pedal pulses on the left. Her left transmetatarsal amputation is completely healed. On the right she does have very superficial excoriation of her fifth digit amputation which was done 1 year ago.  Noninvasive vascular laboratories today were reviewed with the patient. This is such a widely patent left femoral anterior tibial bypass placed in 2011. She had a prior atherectomy in the right superficial femoral artery and there is narrowing here which is no significant change since 6 months study.  Impression and plan stable from a standpoint of peripheral vascular occlusive disease. She does have very severe disease bilateral. Stable bypass. Did explain that she's had recurrent narrowing in her affect and a segment of her superficial artery. She's not having any difficulty with healing and therefore would recommend continued observation only. She'll notify should she develop any evidence of limb ischemia. We'll also see [place] again in 6 months with repeat noninvasive studies

## 2014-06-03 ENCOUNTER — Telehealth: Payer: Self-pay | Admitting: Internal Medicine

## 2014-06-03 NOTE — Telephone Encounter (Signed)
Patient called to address her questions about doing lab work tomorrow before her appointment Monday. [name] states that she does not take b12 shots anymore and needs to get her hemoglobin checked, and that Dr. Harrington Challenger usually checks her kidney labs.  Patient was informed that she has no orders for lab work at this time, and that if Dr. Harrington Challenger wants them she will order them at her appointment. [name] states that she understands and will be at her appt Monday.

## 2014-06-03 NOTE — Telephone Encounter (Signed)
New message     Pt has an appt on Monday.  Can she do her lab work tomorrow and Dr Harrington Challenger have the results by Monday?

## 2014-06-07 ENCOUNTER — Ambulatory Visit (INDEPENDENT_AMBULATORY_CARE_PROVIDER_SITE_OTHER): Payer: Medicare Other | Admitting: Internal Medicine

## 2014-06-07 VITALS — BP 118/62 | HR 64 | Ht 65.0 in | Wt 159.0 lb

## 2014-06-07 DIAGNOSIS — I2510 Atherosclerotic heart disease of native coronary artery without angina pectoris: Secondary | ICD-10-CM | POA: Diagnosis not present

## 2014-06-07 DIAGNOSIS — E785 Hyperlipidemia, unspecified: Secondary | ICD-10-CM

## 2014-06-07 DIAGNOSIS — I359 Nonrheumatic aortic valve disorder, unspecified: Secondary | ICD-10-CM

## 2014-06-07 DIAGNOSIS — R0602 Shortness of breath: Secondary | ICD-10-CM | POA: Diagnosis not present

## 2014-06-07 NOTE — Progress Notes (Signed)
HPI Patient is a 68 yo with  a history of CAD (s/p CABG- cath in 09/2011 showed patent LIMA to LAD with occlusion of distal LAD; High grade stenosis of nondominant RCA; Signif disease of prox LCx; SVG to L PDA occluded, SVG to OM patent), Aortic stenosis, DM, dyslipidemia, POVD,   Echo 05/2013: Mild LVH, EF 30%, RVEF depressed;  mod aortic stenosis)  The patient underwnet amputaiton of R toe in Fall  I saw her in April Since I saw her she has had intermittent lab tests to dose Lasix. She denies CP  Breathing is OK  Treats edema as neede with 60 or 80 of lasix. Seen by Dr Donnetta Hutching recently   Allergies  Allergen Reactions  . Adhesive [Tape] Other (See Comments)    "old Johnson/Johnson adhesive tape; takes my skin off; can use paper tape"  . Zyvox [Linezolid]     Thrombocytopenia developed to 50k after several weeks of therapy  . Bactrim [Sulfamethoxazole-Trimethoprim]     Hyperkalemia and Increased creatinine  . Cephalexin Swelling and Rash  . Ciprofloxacin Swelling and Rash  . Daptomycin     Had elevated CPK on therapy, not clear that this was due to cubicin  . Vancomycin     ? If this played role in her Acute kidney injury    Current Outpatient Prescriptions  Medication Sig Dispense Refill  . aspirin EC 325 MG EC tablet Take 325 mg by mouth daily.       [name] atorvastatin (LIPITOR) 40 MG tablet TAKE 1 TABLET (40 MG TOTAL) BY MOUTH AT BEDTIME.  30 tablet  0  . cyanocobalamin (,VITAMIN B-12,) 1000 MCG/ML injection Inject 1 mL (1,000 mcg total) into the muscle once.  1 mL  0  . docusate sodium (COLACE) 50 MG capsule Take by mouth 2 (two) times daily.      . furosemide (LASIX) 20 MG tablet Take 60 mg by mouth every other day. 60 mg every other day alternate w/ 80 mg every other day.      . furosemide (LASIX) 40 MG tablet 80 mg every other day. TAKE 2 TABLETS (80 MG TOTAL) BY MOUTH every other day. Alternate w/ 60 mg every other day.      . insulin glargine (LANTUS) 100 UNIT/ML injection Inject  10-22 Units into the skin 2 (two) times daily. 10 U qhs and 22 U in AM daily      . Insulin Lispro, Human, (HUMALOG ) Inject into the skin. Sliding scale      . isosorbide mononitrate (IMDUR) 30 MG 24 hr tablet TAKE 1 TABLET (30 MG TOTAL) BY MOUTH DAILY.  30 tablet  2  . levothyroxine (SYNTHROID, LEVOTHROID) 125 MCG tablet TAKE 1 TABLET (125 MCG TOTAL) BY MOUTH DAILY.  30 tablet  5  . metoprolol succinate (TOPROL-XL) 25 MG 24 hr tablet Take 1 tablet (25 mg total) by mouth daily.  30 tablet  11   No current facility-administered medications for this visit.    Past Medical History  Diagnosis Date  . HTN (hypertension)   . Hypothyroidism   . History of recurrent UTIs   . CAD (coronary artery disease)     s/p CABG x3 in 1993; last cath in 2010  . Scleroderma   . Bilateral carpal tunnel syndrome 1970s  . 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 Followed by Dr. Donnetta Hutching  . Claudication   . Aortic  stenosis     0.8cm2 on echo in 10/2011  . Hyperlipidemia     on Lipitor  . Cat bite 2009    with MRSA; required I & D  . Heart murmur   . Blood transfusion     no reaction from transfusion; "when I had heart surgery" (08/28/2012)  . GERD (gastroesophageal reflux disease)   . CHF (congestive heart failure) Aug. 2012    Echo 10/2011: Mild LVH, EF 30%, apex akinetic, septal HK, grade 2 diastolic dysfunction, or functionally bicuspid aortic valve, mild aortic stenosis by gradient, severe by calculated AVA-suspect A. Aortic stenosis probably moderate, trivial MR, mild LAE, mild RAE.   [name] Cellulitis   . Anemia   . B12 deficiency anemia     B 12 injection "monthly since 05/2012" (08/28/2012)  . Iron deficiency anemia 06/13/2012    "iron infusion" (08/28/2012  . Anginal pain     "history; not currently" (08/28/2012)  . Myocardial infarction 1986    "silent"  . History of bronchitis     "I've had it twice in my life" (08/28/2012)  .  Exertional dyspnea     "because of the heart failure" (08/28/2012)  . Type 1 diabetes 10/1949  . Stomach ulcer 1981?    "on Tagament for ~ 1 yr" (08/28/2012)  . Seizure     ? seizure like event in 2010; workup included stress testing which led to repeat cath.   . Arthritis     "hands, hips, all over" (08/28/2012  . Osteoarthritis of both feet   . Breast cancer     left  . Cellulitis June 2013, Nov. 2013 and Feb. 14, 2014    Left leg    Past Surgical History  Procedure Laterality Date  . Carpal tunnel release  ~ 1970's    bilaterally  . Mastectomy  11/1982    Left Breast  . Vitrectomy  1990's-2004    "both eyes; I've had total of 4"  . Tubal ligation  1984  . Tonsillectomy  1952    "?adenoids"   . Appendectomy  1991  . Femoral bypass      left leg "related to transmetatarsal amputation" (08/28/2012)  . Breast biopsy with sentinel lymph node biopsy and needle localization  1984  . Coronary artery bypass graft  1992    LIMA to LAD, SVG to distal LCX & SVG to Marginal  . Cardiac catheterization  2010    LIMA to LAD patent yet with occluded distal LAD after graft insertion & retrograde is occluded. 3-4 septal perforators arise &are patent. SVG to a large marginal patent; SVG presumably to the distal dominant LCX is occluded, moderately high grade stenosis of the AV circumflex, but with distal stenoses involving a severely diseased marginal branch not amenable  to PCI  nor is inferior branch   . Cardiac catheterization  10/2011    "unsuccessful attempt of stenting" (08/28/2012)  . Cardiac catheterization  1992; 09/2011  . Breast biopsy    . Trigger finger release  1980's    right thumb  . Incision and drainage of wound  ~ 1967    "infection in left foot" (08/28/2012)  . Incision and drainage of wound  2009    "3 ORs on my right hand after cat bite" (08/28/2012)  . 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 Cataract extraction w/ intraocular lens   implant, bilateral  1990's  . Amputation Right 05/27/2013  Procedure: AMPUTATION DIGIT FIFTH TOE;  Surgeon: Rosetta Posner, MD;  [facility];  Service: Vascular;  Laterality: Right;    Family History  Problem Relation Age of Onset  . Diabetes Mother     History   Social History  . Marital Status: Single    Spouse Name: N/A    Number of Children: N/A  . 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 Smoking status: Never Smoker   . Smokeless tobacco: Never Used  . Alcohol Use: No  . Drug Use: No  . Sexual Activity: Not Currently    Birth Control/ Protection: Post-menopausal   Other Topics Concern  . Not on file   Social History Narrative  . No narrative on file    Review of Systems:  All systems reviewed.  They are negative to the above problem except as previously stated.  Vital Signs: BP 118/62  Pulse 64  Ht 5\' 5"  (1.651 m)  Wt 159 lb (72.122 kg)  BMI 26.46 kg/m2  Physical Exam Patient is in NAD HEENT:  Normocephalic, atraumatic. EOMI, PERRLA.  Neck: JVP is normal   Lungs: clear to auscultation. No rales no wheezes.  Heart: Regular rate and rhythm. Normal S1, S2. No S3.  III/VI systolic murmr  Abdomen:  Supple, nontender. Normal bowel sounds. No masses. No hepatomegaly.   Extremities:   1+ LE edema Musculoskeletal :moving all extremities.  Neuro:   alert and oriented x3.  CN II-XII grossly intact.  EKG:  SB 55 Anteroseptal MI  T wave inversion I, AVL; Biphasic V4-V6 Assessment and Plan: 1.  Acute on chronic systolic CHF.  Volume is up a little but not bad  Will check labs.    2.  CAD  No symptoms to suggest angina.    2.  HTN BP a little high  Would follow  3.  HL  Continue meds. Check labs.    4.  PVOD  Follow up with T Early   5.  AS  Follow    6.  CKD Repeat BMET   7.  Hypothyroidism.  Followed by Dr Chalmers Cater.

## 2014-06-07 NOTE — Patient Instructions (Signed)
Your physician recommends that you have lab work today: BMP, BNP, LIPID, AST, CBC and TSH  Your physician wants you to follow-up in: 6 MONTHS with Dr Harrington Challenger.  You will receive a reminder letter in the mail two months in advance. If you don't receive a letter, please call our office to schedule the follow-up appointment.  Your physician recommends that you continue on your current medications as directed. Please refer to the Current Medication list given to you today.

## 2014-06-08 LAB — BASIC METABOLIC PANEL
BUN: 48 mg/dL — ABNORMAL HIGH (ref 6–23)
CO2: 28 meq/L (ref 19–32)
Calcium: 9.4 mg/dL (ref 8.4–10.5)
Chloride: 100 mEq/L (ref 96–112)
Creatinine, Ser: 1.6 mg/dL — ABNORMAL HIGH (ref 0.4–1.2)
GFR: 33.49 mL/min — ABNORMAL LOW (ref >60.00)
GLUCOSE: 331 mg/dL — AB (ref 70–99)
Potassium: 4.4 mEq/L (ref 3.5–5.1)
Sodium: 134 mEq/L — ABNORMAL LOW (ref 135–145)

## 2014-06-08 LAB — CBC
HCT: 39.2 % (ref 36.0–46.0)
Hemoglobin: 13.1 g/dL (ref 12.0–15.0)
MCHC: 33.6 g/dL (ref 30.0–36.0)
MCV: 92.4 fl (ref 78.0–100.0)
Platelets: 193 10*3/uL (ref 150.0–400.0)
RBC: 4.24 Mil/uL (ref 3.87–5.11)
RDW: 14.9 % (ref 11.5–15.5)
WBC: 6.3 10*3/uL (ref 4.0–10.5)

## 2014-06-08 LAB — LIPID PANEL
CHOL/HDL RATIO: 4
Cholesterol: 180 mg/dL (ref 0–200)
HDL: 40.8 mg/dL (ref >39.00)
LDL CALC: 115 mg/dL — AB (ref 0–99)
NONHDL: 139.2
Triglycerides: 122 mg/dL (ref 0.0–149.0)
VLDL: 24.4 mg/dL (ref 0.0–40.0)

## 2014-06-08 LAB — BRAIN NATRIURETIC PEPTIDE: Pro B Natriuretic peptide (BNP): 628 pg/mL — ABNORMAL HIGH (ref 0.0–100.0)

## 2014-06-08 LAB — AST: AST: 23 U/L (ref 0–37)

## 2014-06-08 LAB — TSH: TSH: 0.3 u[IU]/mL — AB (ref 0.35–4.50)

## 2014-06-24 ENCOUNTER — Other Ambulatory Visit: Payer: Medicare Other

## 2014-06-24 ENCOUNTER — Other Ambulatory Visit (INDEPENDENT_AMBULATORY_CARE_PROVIDER_SITE_OTHER): Payer: Medicare Other

## 2014-06-24 DIAGNOSIS — N183 Chronic kidney disease, stage 3 unspecified: Secondary | ICD-10-CM

## 2014-06-24 DIAGNOSIS — R0989 Other specified symptoms and signs involving the circulatory and respiratory systems: Secondary | ICD-10-CM | POA: Diagnosis not present

## 2014-06-24 DIAGNOSIS — I509 Heart failure, unspecified: Secondary | ICD-10-CM | POA: Diagnosis not present

## 2014-06-24 DIAGNOSIS — I2510 Atherosclerotic heart disease of native coronary artery without angina pectoris: Secondary | ICD-10-CM

## 2014-06-24 DIAGNOSIS — R0609 Other forms of dyspnea: Secondary | ICD-10-CM | POA: Diagnosis not present

## 2014-06-24 LAB — CBC WITH DIFFERENTIAL/PLATELET
BASOS PCT: 0.6 % (ref 0.0–3.0)
Basophils Absolute: 0 10*3/uL (ref 0.0–0.1)
EOS PCT: 5.6 % — AB (ref 0.0–5.0)
Eosinophils Absolute: 0.3 10*3/uL (ref 0.0–0.7)
HCT: 37.8 % (ref 36.0–46.0)
Hemoglobin: 12.7 g/dL (ref 12.0–15.0)
Lymphocytes Relative: 19.5 % (ref 12.0–46.0)
Lymphs Abs: 1 10*3/uL (ref 0.7–4.0)
MCHC: 33.7 g/dL (ref 30.0–36.0)
MCV: 92.3 fl (ref 78.0–100.0)
MONO ABS: 0.7 10*3/uL (ref 0.1–1.0)
Monocytes Relative: 13.6 % — ABNORMAL HIGH (ref 3.0–12.0)
Neutro Abs: 3.2 10*3/uL (ref 1.4–7.7)
Neutrophils Relative %: 60.7 % (ref 43.0–77.0)
Platelets: 145 10*3/uL — ABNORMAL LOW (ref 150.0–400.0)
RBC: 4.09 Mil/uL (ref 3.87–5.11)
RDW: 15.1 % (ref 11.5–15.5)
WBC: 5.3 10*3/uL (ref 4.0–10.5)

## 2014-06-24 LAB — BASIC METABOLIC PANEL
BUN: 37 mg/dL — ABNORMAL HIGH (ref 6–23)
CHLORIDE: 104 meq/L (ref 96–112)
CO2: 28 mEq/L (ref 19–32)
Calcium: 9.1 mg/dL (ref 8.4–10.5)
Creatinine, Ser: 1.5 mg/dL — ABNORMAL HIGH (ref 0.4–1.2)
GFR: 36.32 mL/min — ABNORMAL LOW (ref >60.00)
Glucose, Bld: 188 mg/dL — ABNORMAL HIGH (ref 70–99)
Potassium: 4.4 mEq/L (ref 3.5–5.1)
Sodium: 138 mEq/L (ref 135–145)

## 2014-06-24 LAB — BRAIN NATRIURETIC PEPTIDE: Pro B Natriuretic peptide (BNP): 810 pg/mL — ABNORMAL HIGH (ref 0.0–100.0)

## 2014-06-25 ENCOUNTER — Telehealth: Payer: Self-pay | Admitting: *Deleted

## 2014-06-25 DIAGNOSIS — R609 Edema, unspecified: Secondary | ICD-10-CM

## 2014-06-25 NOTE — Telephone Encounter (Signed)
Informed pt of lab results. She reports that since changing her lasix dose, she is a little more SOB and has edema. No recent weights to report. Discussed with Dr. Harrington Challenger. Informed patient to change lasix to 60mg  daily, alternating with 80 mg daily,  Repeat bmet in 2 weeks. Pt verbalizes understanding and agreement.

## 2014-06-25 NOTE — Telephone Encounter (Signed)
Message copied by Rodman Key on Fri Jun 25, 2014  5:38 PM ------      Message from: Dorris Carnes V      Created: Fri Jun 25, 2014 12:07 AM       CBC is OK      Electrolytes are a little better       Fluid is up mildly       I would recomm staying on current regimen ------

## 2014-07-07 ENCOUNTER — Ambulatory Visit (HOSPITAL_COMMUNITY)
Admission: RE | Admit: 2014-07-07 | Discharge: 2014-07-07 | Disposition: A | Discharge status: Home / Self Care | Payer: Medicare Other | Source: Ambulatory Visit | Attending: Cardiology | Admitting: Cardiology

## 2014-07-07 ENCOUNTER — Other Ambulatory Visit (HOSPITAL_COMMUNITY): Payer: Self-pay | Admitting: Surgery

## 2014-07-07 ENCOUNTER — Other Ambulatory Visit (HOSPITAL_COMMUNITY): Payer: Medicare Other

## 2014-07-07 ENCOUNTER — Telehealth (HOSPITAL_COMMUNITY): Payer: Self-pay | Admitting: *Deleted

## 2014-07-07 DIAGNOSIS — M25561 Pain in right knee: Secondary | ICD-10-CM

## 2014-07-07 DIAGNOSIS — M7989 Other specified soft tissue disorders: Secondary | ICD-10-CM | POA: Diagnosis not present

## 2014-07-07 DIAGNOSIS — M79609 Pain in unspecified limb: Secondary | ICD-10-CM | POA: Diagnosis not present

## 2014-07-07 DIAGNOSIS — M25569 Pain in unspecified knee: Secondary | ICD-10-CM | POA: Diagnosis not present

## 2014-07-07 NOTE — Progress Notes (Addendum)
Venous Duplex Lower Ext. Right Completed. No evidence of DVT or SVT in the right lower extremity. Oda Cogan, BS, RDMS, RVT

## 2014-07-09 ENCOUNTER — Other Ambulatory Visit: Payer: Medicare Other

## 2014-07-09 ENCOUNTER — Other Ambulatory Visit (INDEPENDENT_AMBULATORY_CARE_PROVIDER_SITE_OTHER): Payer: Medicare Other

## 2014-07-09 DIAGNOSIS — R609 Edema, unspecified: Secondary | ICD-10-CM

## 2014-07-09 LAB — BASIC METABOLIC PANEL
BUN: 30 mg/dL — AB (ref 6–23)
CALCIUM: 9.6 mg/dL (ref 8.4–10.5)
CO2: 28 mEq/L (ref 19–32)
Chloride: 97 mEq/L (ref 96–112)
Creatinine, Ser: 1.6 mg/dL — ABNORMAL HIGH (ref 0.4–1.2)
GFR: 33.72 mL/min — ABNORMAL LOW (ref >60.00)
Glucose, Bld: 337 mg/dL — ABNORMAL HIGH (ref 70–99)
POTASSIUM: 3.8 meq/L (ref 3.5–5.1)
Sodium: 132 mEq/L — ABNORMAL LOW (ref 135–145)

## 2014-07-12 ENCOUNTER — Telehealth: Payer: Self-pay | Admitting: Internal Medicine

## 2014-07-12 NOTE — Telephone Encounter (Signed)
New problem   Pt need to speak to you concerning her labs in analyzing the results. Please call pt.

## 2014-07-14 DIAGNOSIS — M25569 Pain in unspecified knee: Secondary | ICD-10-CM | POA: Diagnosis not present

## 2014-07-15 NOTE — Telephone Encounter (Signed)
Late entry from 9/28l/15: Pt aware of lab results. She states that for the last two weeks she has been taking 60 mg daily and about every third day she has been taking 80 mg. Her swelling is decreased. She will continue to take 60 mg daily and will take 80 mg only as needed for leg swelling.... She will be more diligent about elevating legs and will continue to monitor sodium intake.

## 2014-07-20 ENCOUNTER — Telehealth: Payer: Self-pay | Admitting: *Deleted

## 2014-07-20 IMAGING — CR DG CHEST 1V PORT
1 series · 1 of 1 positions shown · non-contrast
Comparison: 11/28/2012

CLINICAL DATA: Cough, shortness of breath.

PORTABLE CHEST - 1 VIEW

[AP]
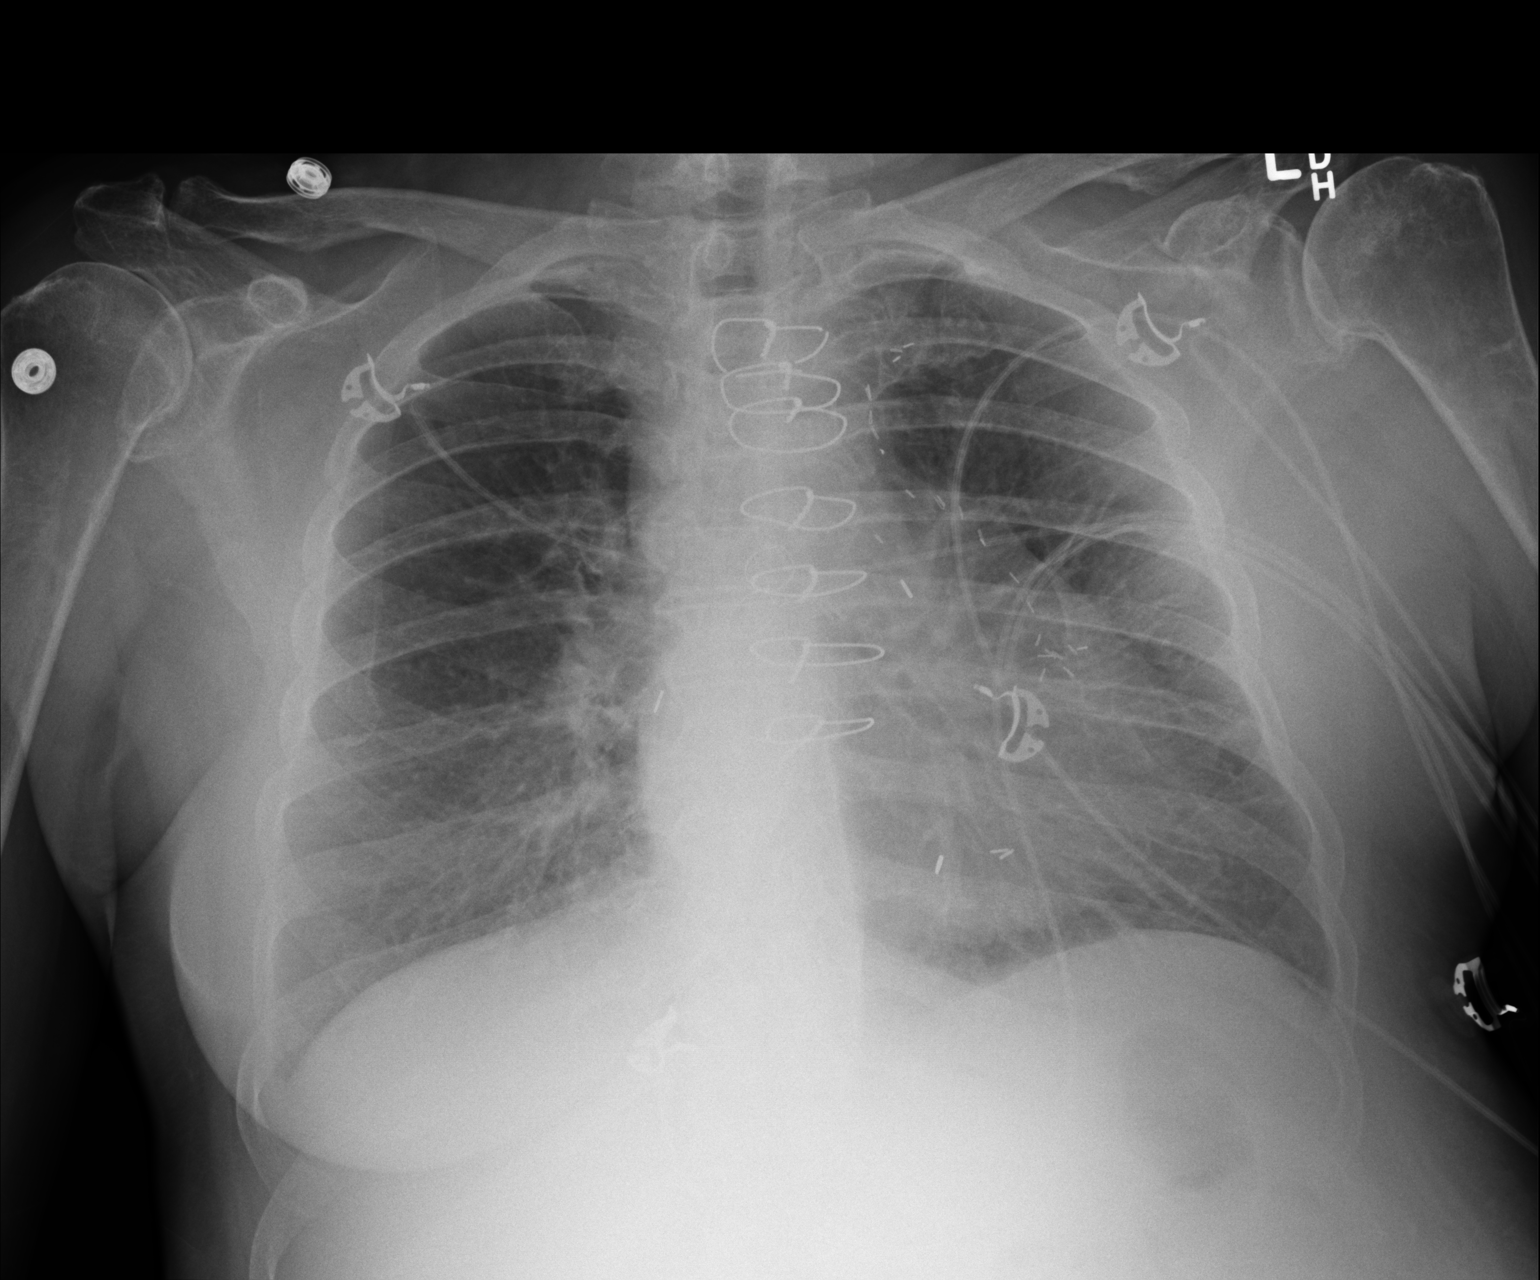

[1 of 1 positions shown; findings below may reference images not displayed]

FINDINGS: Previous CABG.  Heart size upper limits normal.  Lungs
clear.  No effusion.
IMPRESSION: No acute disease post CABG.

## 2014-07-20 NOTE — Telephone Encounter (Signed)
rec'd a request for humalog refill, please review, will not let me enter unless i add sliding scale, i think pt needs to be seen asap

## 2014-07-21 ENCOUNTER — Telehealth: Payer: Self-pay | Admitting: Dietician

## 2014-07-21 ENCOUNTER — Other Ambulatory Visit: Payer: Self-pay | Admitting: Internal Medicine

## 2014-07-21 DIAGNOSIS — E108 Type 1 diabetes mellitus with unspecified complications: Principal | ICD-10-CM

## 2014-07-21 DIAGNOSIS — E1065 Type 1 diabetes mellitus with hyperglycemia: Secondary | ICD-10-CM

## 2014-07-21 DIAGNOSIS — IMO0002 Reserved for concepts with insufficient information to code with codable children: Secondary | ICD-10-CM

## 2014-07-21 MED ORDER — INSULIN LISPRO 100 UNIT/ML ~~LOC~~ SOLN: SUBCUTANEOUS | Status: DC

## 2014-07-21 NOTE — Telephone Encounter (Signed)
I sent a refill for humalog but patient hasn't been seen since February and needs an appointment either this week or early next week.

## 2014-07-21 NOTE — Telephone Encounter (Signed)
Left message at patient's number and Dr. Almetta Lovely office requesting her Humalog "sliding scale".

## 2014-07-21 NOTE — Telephone Encounter (Signed)
Patient called and she said Dr. Chalmers Cater has already refilled her Humalog and that we don't need to do anything. Her pharmacy messed up. She is not using [place] as her primary care, but might be back if she is hospitalized again.

## 2014-07-21 NOTE — Telephone Encounter (Signed)
I also attempted to call the patient and left a message on her voicemail to get the humalog scale she is currently on.

## 2014-07-22 NOTE — Telephone Encounter (Signed)
Pt called again, no answer.  Message left to call clinci

## 2014-07-26 NOTE — Telephone Encounter (Signed)
[name] talked with pt and insulin is ordered by Dr Chalmers Cater, and refill has been done by him.

## 2014-07-30 ENCOUNTER — Other Ambulatory Visit: Payer: Self-pay

## 2014-08-24 ENCOUNTER — Other Ambulatory Visit: Payer: Self-pay | Admitting: Internal Medicine

## 2014-08-26 ENCOUNTER — Other Ambulatory Visit: Payer: Medicare Other

## 2014-08-26 ENCOUNTER — Other Ambulatory Visit: Payer: Self-pay | Admitting: Hematology and Oncology

## 2014-08-30 ENCOUNTER — Other Ambulatory Visit (INDEPENDENT_AMBULATORY_CARE_PROVIDER_SITE_OTHER): Payer: Medicare Other

## 2014-08-30 ENCOUNTER — Other Ambulatory Visit (HOSPITAL_BASED_OUTPATIENT_CLINIC_OR_DEPARTMENT_OTHER): Payer: Medicare Other

## 2014-08-30 ENCOUNTER — Telehealth: Payer: Self-pay | Admitting: Internal Medicine

## 2014-08-30 DIAGNOSIS — D509 Iron deficiency anemia, unspecified: Secondary | ICD-10-CM | POA: Diagnosis not present

## 2014-08-30 DIAGNOSIS — I10 Essential (primary) hypertension: Secondary | ICD-10-CM | POA: Diagnosis not present

## 2014-08-30 DIAGNOSIS — R0602 Shortness of breath: Secondary | ICD-10-CM

## 2014-08-30 DIAGNOSIS — D518 Other vitamin B12 deficiency anemias: Secondary | ICD-10-CM

## 2014-08-30 DIAGNOSIS — D511 Vitamin B12 deficiency anemia due to selective vitamin B12 malabsorption with proteinuria: Secondary | ICD-10-CM | POA: Diagnosis not present

## 2014-08-30 LAB — BASIC METABOLIC PANEL
BUN: 35 mg/dL — AB (ref 6–23)
CO2: 28 mEq/L (ref 19–32)
CREATININE: 1.9 mg/dL — AB (ref 0.4–1.2)
Calcium: 8.9 mg/dL (ref 8.4–10.5)
Chloride: 102 mEq/L (ref 96–112)
GFR: 27.68 mL/min — AB (ref >60.00)
GLUCOSE: 220 mg/dL — AB (ref 70–99)
Potassium: 5.1 mEq/L (ref 3.5–5.1)
Sodium: 137 mEq/L (ref 135–145)

## 2014-08-30 LAB — CBC WITH DIFFERENTIAL/PLATELET
Basophils Absolute: 0 10*3/uL (ref 0.0–0.1)
Basophils Relative: 0.5 % (ref 0.0–3.0)
EOS PCT: 3.7 % (ref 0.0–5.0)
Eosinophils Absolute: 0.3 10*3/uL (ref 0.0–0.7)
HCT: 38.6 % (ref 36.0–46.0)
Hemoglobin: 12.6 g/dL (ref 12.0–15.0)
Lymphocytes Relative: 12.5 % (ref 12.0–46.0)
Lymphs Abs: 0.9 10*3/uL (ref 0.7–4.0)
MCHC: 32.7 g/dL (ref 30.0–36.0)
MCV: 95.7 fl (ref 78.0–100.0)
Monocytes Absolute: 1 10*3/uL (ref 0.1–1.0)
Monocytes Relative: 13.7 % — ABNORMAL HIGH (ref 3.0–12.0)
Neutro Abs: 5.1 10*3/uL (ref 1.4–7.7)
Neutrophils Relative %: 69.6 % (ref 43.0–77.0)
PLATELETS: 207 10*3/uL (ref 150.0–400.0)
RBC: 4.03 Mil/uL (ref 3.87–5.11)
RDW: 15.9 % — ABNORMAL HIGH (ref 11.5–15.5)
WBC: 7.4 10*3/uL (ref 4.0–10.5)

## 2014-08-30 LAB — BRAIN NATRIURETIC PEPTIDE: Pro B Natriuretic peptide (BNP): 1017 pg/mL — ABNORMAL HIGH (ref 0.0–100.0)

## 2014-08-30 LAB — FERRITIN CHCC: FERRITIN: 60 ng/mL (ref 9–269)

## 2014-08-30 LAB — VITAMIN B12: Vitamin B-12: 386 pg/mL (ref 211–911)

## 2014-08-30 NOTE — Telephone Encounter (Signed)
[name] calling stating she has gained 12 lbs over the past 3 wks. States she has been immobile due to leg injury but is very SOB.  Legs are edematous but not "any more than usual".  States she feels "lousy".  BP nml. States she just came from cancer center where they drew lab for B12 level and was told to D/C her B 12.  Is currently taking Lasix 60 mg daily due to lab drawn in Sept. Spoke w/Dr. Harrington Challenger who wants to get labs today and then see her later in week.  Labs BNP,CBC,BMET.  She will get labs at [facility] lab and then see Dr. Harrington Challenger on Thurs 11/29 at 4:15.  She will go to [place] lab today to get labs.

## 2014-08-30 NOTE — Telephone Encounter (Signed)
New message          C/o sob and feeling queezie per pt

## 2014-09-02 ENCOUNTER — Ambulatory Visit (HOSPITAL_BASED_OUTPATIENT_CLINIC_OR_DEPARTMENT_OTHER): Payer: Medicare Other | Admitting: Hematology and Oncology

## 2014-09-02 ENCOUNTER — Ambulatory Visit (INDEPENDENT_AMBULATORY_CARE_PROVIDER_SITE_OTHER): Payer: Medicare Other | Admitting: Internal Medicine

## 2014-09-02 ENCOUNTER — Telehealth: Payer: Self-pay | Admitting: Hematology and Oncology

## 2014-09-02 ENCOUNTER — Other Ambulatory Visit: Payer: Self-pay | Admitting: *Deleted

## 2014-09-02 ENCOUNTER — Encounter: Payer: Self-pay | Admitting: Hematology and Oncology

## 2014-09-02 ENCOUNTER — Encounter: Payer: Self-pay | Admitting: Internal Medicine

## 2014-09-02 VITALS — BP 118/57 | HR 57 | Ht 65.0 in | Wt 167.0 lb

## 2014-09-02 DIAGNOSIS — R0602 Shortness of breath: Secondary | ICD-10-CM

## 2014-09-02 DIAGNOSIS — D518 Other vitamin B12 deficiency anemias: Secondary | ICD-10-CM

## 2014-09-02 DIAGNOSIS — I509 Heart failure, unspecified: Secondary | ICD-10-CM

## 2014-09-02 DIAGNOSIS — I2510 Atherosclerotic heart disease of native coronary artery without angina pectoris: Secondary | ICD-10-CM

## 2014-09-02 DIAGNOSIS — I5032 Chronic diastolic (congestive) heart failure: Secondary | ICD-10-CM

## 2014-09-02 MED ORDER — CYANOCOBALAMIN 1000 MCG/ML IJ SOLN
INTRAMUSCULAR | Status: AC
  Filled 2014-09-02: qty 1

## 2014-09-02 MED ORDER — CYANOCOBALAMIN 1000 MCG/ML IJ SOLN
1000.0000 ug | Freq: Once | INTRAMUSCULAR | Status: AC
  Administered 2014-09-02: 1000 ug via INTRAMUSCULAR

## 2014-09-02 NOTE — Assessment & Plan Note (Signed)
Clinically, she appears euvolemic. She will continue close follow-up with cardiologist.

## 2014-09-02 NOTE — Patient Instructions (Addendum)
Your physician recommends that you return for lab work on Monday, 09/06/14 (BMET, BNP) Your physician recommends that you continue on your current medications as directed. Please refer to the Current Medication list given to you today.

## 2014-09-02 NOTE — Telephone Encounter (Signed)
lvm for pt regarding to all 2016 apptl.....mailed pt appt sched/avs adn letter

## 2014-09-02 NOTE — Assessment & Plan Note (Signed)
Since we discontinue her vitamin B-12 injection, the vitamin B-12 level has dropped by more than half. I am concerned that she might get deficient again. I will resume vitamin B-12 injection but to give every other month. I will see her back next her.

## 2014-09-02 NOTE — Progress Notes (Signed)
[facility] OFFICE PROGRESS NOTE  No primary care provider on file. SUMMARY OF HEMATOLOGIC HISTORY: This patient was noted to have combine iron deficiency and B12 deficiency in 2013. She received 1 dose of intravenous iron feraheme on 06/13/2012. Around that same time, she was started on B12 injection on a monthly basis. Subsequently, vitamin B-12 injection was discontinued and she was being observed.  INTERVAL HISTORY: [name] 68 y.o. female returns for further follow-up. The patient is currently on close follow-up with her primary doctor and cardiologist for recurrent congestive heart failure. She complain of fatigue. She is not taking oral iron or vitamin B-12 supplement. The patient denies any recent signs or symptoms of bleeding such as spontaneous epistaxis, hematuria or hematochezia.  I have reviewed the past medical history, past surgical history, social history and family history with the patient and they are unchanged from previous note.  ALLERGIES:  is allergic to adhesive; zyvox; bactrim; cephalexin; ciprofloxacin; daptomycin; and vancomycin.  MEDICATIONS:  Current Outpatient Prescriptions  Medication Sig Dispense Refill  . aspirin EC 325 MG EC tablet Take 325 mg by mouth daily.     [name] atorvastatin (LIPITOR) 40 MG tablet TAKE 1 TABLET (40 MG TOTAL) BY MOUTH AT BEDTIME. 30 tablet 0  . cyanocobalamin (,VITAMIN B-12,) 1000 MCG/ML injection Inject 1 mL (1,000 mcg total) into the muscle once. 1 mL 0  . docusate sodium (COLACE) 50 MG capsule Take by mouth 2 (two) times daily.    . furosemide (LASIX) 20 MG tablet Take 20 mg by mouth daily. Take 60 mg daily    . insulin glargine (LANTUS) 100 UNIT/ML injection Inject 10-22 Units into the skin 2 (two) times daily. 10 U qhs and 22 U in AM daily    . insulin lispro (HUMALOG) 100 UNIT/ML injection Please inject subcutaneously before meals according to sliding scale 10 mL 11  . isosorbide mononitrate (IMDUR) 30 MG 24  hr tablet TAKE 1 TABLET (30 MG TOTAL) BY MOUTH DAILY. 30 tablet 2  . levothyroxine (SYNTHROID, LEVOTHROID) 125 MCG tablet TAKE 1 TABLET (125 MCG TOTAL) BY MOUTH DAILY. 30 tablet 5  . metoprolol succinate (TOPROL-XL) 25 MG 24 hr tablet TAKE 1 TABLET (25 MG TOTAL) BY MOUTH DAILY. 30 tablet 5   No current facility-administered medications for this visit.     REVIEW OF SYSTEMS:   Constitutional: Denies fevers, chills or night sweats Eyes: Denies blurriness of vision Ears, nose, mouth, throat, and face: Denies mucositis or sore throat Respiratory: Denies cough, dyspnea or wheezes Cardiovascular: Denies palpitation, chest discomfort. She has intermittent leg swelling. Gastrointestinal:  Denies nausea, heartburn or change in bowel habits Skin: Denies abnormal skin rashes Lymphatics: Denies new lymphadenopathy or easy bruising Neurological:Denies numbness, tingling or new weaknesses Behavioral/Psych: Mood is stable, no new changes  All other systems were reviewed with the patient and are negative.  PHYSICAL EXAMINATION: ECOG PERFORMANCE STATUS: 1 - Symptomatic but completely ambulatory GENERAL:alert, no distress and comfortable SKIN: skin color, texture, turgor are normal, no rashes or significant lesions EYES: normal, Conjunctiva are pink and non-injected, sclera clear HEART: regular rate & rhythm and no murmurs with mild bilateral lower extremity edema NEURO: alert & oriented x 3 with fluent speech, no focal motor/sensory deficits  LABORATORY DATA:  I have reviewed the data as listed No results found for this or any previous visit (from the past 48 hour(s)).  Lab Results  Component Value Date   WBC 7.4 08/30/2014   HGB 12.6 08/30/2014  HCT 38.6 08/30/2014   MCV 95.7 08/30/2014   PLT 207.0 08/30/2014    ASSESSMENT & PLAN:  Vitamin B12 deficiency (dietary) anemia Since we discontinue her vitamin B-12 injection, the vitamin B-12 level has dropped by more than half. I am concerned  that she might get deficient again. I will resume vitamin B-12 injection but to give every other month. I will see her back next her.  CHF (congestive heart failure) Clinically, she appears euvolemic. She will continue close follow-up with cardiologist.   All questions were answered. The patient knows to call the clinic with any problems, questions or concerns. No barriers to learning was detected.  I spent 25 minutes counseling the patient face to face. The total time spent in the appointment was 30 minutes and more than 50% was on counseling.     [facility], [place], MD 09/02/2014 4:28 PM

## 2014-09-02 NOTE — Progress Notes (Signed)
HPI Patient is a 68 yo with  a history of CAD (s/p CABG- cath in 09/2011 showed patent LIMA to LAD with occlusion of distal LAD; High grade stenosis of nondominant RCA; Signif disease of prox LCx; SVG to L PDA occluded, SVG to OM patent), Aortic stenosis, DM, dyslipidemia, PV)D and CKD,   Echo 05/2013: Mild LVH, EF 30%, RVEF depressed;  mod aortic stenosis)   The patient called in this week complaining of increased edema, increased SOB , incrased wt  I recomm she have labs  She did this and cr was increased as was BNP I recomm an additional lasix every 3 days    Since then, she has done the additional lasix once  She says her wt is down and her breathing is a little better.  On looking back she thinks that dietary indiscrestion in Sept lead to wt gain and edema     Allergies  Allergen Reactions  . Adhesive [Tape] Other (See Comments)    "old Johnson/Johnson adhesive tape; takes my skin off; can use paper tape"  . Zyvox [Linezolid]     Thrombocytopenia developed to 50k after several weeks of therapy  . Bactrim [Sulfamethoxazole-Trimethoprim]     Hyperkalemia and Increased creatinine  . Cephalexin Swelling and Rash  . Ciprofloxacin Swelling and Rash  . Daptomycin     Had elevated CPK on therapy, not clear that this was due to cubicin  . Vancomycin     ? If this played role in her Acute kidney injury    Current Outpatient Prescriptions  Medication Sig Dispense Refill  . aspirin EC 325 MG EC tablet Take 325 mg by mouth daily.     [name] atorvastatin (LIPITOR) 40 MG tablet TAKE 1 TABLET (40 MG TOTAL) BY MOUTH AT BEDTIME. 30 tablet 0  . cyanocobalamin (,VITAMIN B-12,) 1000 MCG/ML injection Inject 1 mL (1,000 mcg total) into the muscle once. (Patient taking differently: Inject 1,000 mcg into the muscle every 8 (eight) weeks. ) 1 mL 0  . docusate sodium (COLACE) 50 MG capsule Take by mouth 2 (two) times daily.    . Ferrous Sulfate 50 MG TBCR Take 1 tablet by mouth 2 (two) times a week.    .  furosemide (LASIX) 20 MG tablet Take 60 mg by mouth daily. Take 60 mg daily. Add 40 every 3rd day    . insulin glargine (LANTUS) 100 UNIT/ML injection Inject 10-22 Units into the skin 2 (two) times daily. 10 U qhs and 22 U in AM daily    . insulin lispro (HUMALOG) 100 UNIT/ML injection Please inject subcutaneously before meals according to sliding scale 10 mL 11  . isosorbide mononitrate (IMDUR) 30 MG 24 hr tablet TAKE 1 TABLET (30 MG TOTAL) BY MOUTH DAILY. 30 tablet 2  . levothyroxine (SYNTHROID, LEVOTHROID) 112 MCG tablet Take 112 mcg by mouth daily before breakfast.    . metoprolol succinate (TOPROL-XL) 25 MG 24 hr tablet TAKE 1 TABLET (25 MG TOTAL) BY MOUTH DAILY. 30 tablet 5   No current facility-administered medications for this visit.    Past Medical History  Diagnosis Date  . HTN (hypertension)   . Hypothyroidism   . History of recurrent UTIs   . CAD (coronary artery disease)     s/p CABG x3 in 1993; last cath in 2010  . Scleroderma   . Bilateral carpal tunnel syndrome 1970s  . PVD (peripheral vascular disease)     Toes amputated from left foot and has had prior bypass  on left leg. Followed by Dr. Donnetta Hutching  . Claudication   . Aortic stenosis     0.8cm2 on echo in 10/2011  . Hyperlipidemia     on Lipitor  . Cat bite 2009    with MRSA; required I & D  . Heart murmur   . Blood transfusion     no reaction from transfusion; "when I had heart surgery" (08/28/2012)  . GERD (gastroesophageal reflux disease)   . CHF (congestive heart failure) Aug. 2012    Echo 10/2011: Mild LVH, EF 30%, apex akinetic, septal HK, grade 2 diastolic dysfunction, or functionally bicuspid aortic valve, mild aortic stenosis by gradient, severe by calculated AVA-suspect A. Aortic stenosis probably moderate, trivial MR, mild LAE, mild RAE.   [name] Cellulitis   . Anemia   . B12 deficiency anemia     B 12 injection "monthly since 05/2012" (08/28/2012)  . Iron deficiency anemia 06/13/2012    "iron infusion"  (08/28/2012  . Anginal pain     "history; not currently" (08/28/2012)  . Myocardial infarction 1986    "silent"  . History of bronchitis     "I've had it twice in my life" (08/28/2012)  . Exertional dyspnea     "because of the heart failure" (08/28/2012)  . Type 1 diabetes 10/1949  . Stomach ulcer 1981?    "on Tagament for ~ 1 yr" (08/28/2012)  . Seizure     ? seizure like event in 2010; workup included stress testing which led to repeat cath.   . Arthritis     "hands, hips, all over" (08/28/2012  . Osteoarthritis of both feet   . Breast cancer     left  . Cellulitis June 2013, Nov. 2013 and Feb. 14, 2014    Left leg    Past Surgical History  Procedure Laterality Date  . Carpal tunnel release  ~ 1970's    bilaterally  . Mastectomy  11/1982    Left Breast  . Vitrectomy  1990's-2004    "both eyes; I've had total of 4"  . Tubal ligation  1984  . Tonsillectomy  1952    "?adenoids"   . Appendectomy  1991  . Femoral bypass      left leg "related to transmetatarsal amputation" (08/28/2012)  . Breast biopsy with sentinel lymph node biopsy and needle localization  1984  . Coronary artery bypass graft  1992    LIMA to LAD, SVG to distal LCX & SVG to Marginal  . Cardiac catheterization  2010    LIMA to LAD patent yet with occluded distal LAD after graft insertion & retrograde is occluded. 3-4 septal perforators arise &are patent. SVG to a large marginal patent; SVG presumably to the distal dominant LCX is occluded, moderately high grade stenosis of the AV circumflex, but with distal stenoses involving a severely diseased marginal branch not amenable  to PCI  nor is inferior branch   . Cardiac catheterization  10/2011    "unsuccessful attempt of stenting" (08/28/2012)  . Cardiac catheterization  1992; 09/2011  . Breast biopsy    . Trigger finger release  1980's    right thumb  . Incision and drainage of wound  ~ 1967    "infection in left foot" (08/28/2012)  . Incision and drainage  of wound  2009    "3 ORs on my right hand after cat bite" (08/28/2012)  . 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 Cataract extraction w/  intraocular lens  implant, bilateral  1990's  . Amputation Right 05/27/2013    Procedure: AMPUTATION DIGIT FIFTH TOE;  Surgeon: Rosetta Posner, MD;  Location: [facility] OR;  Service: Vascular;  Laterality: Right;    Family History  Problem Relation Age of Onset  . Diabetes Mother   . Heart attack Neg Hx   . Stroke Mother   . Stroke Father   . Hypertension Mother     History   Social History  . Marital Status: Single    Spouse Name: N/A    Number of Children: N/A  . 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 Smoking status: Never Smoker   . Smokeless tobacco: Never Used  . Alcohol Use: No  . Drug Use: No  . Sexual Activity: Not Currently    Birth Control/ Protection: Post-menopausal   Other Topics Concern  . Not on file   Social History Narrative    Review of Systems:  All systems reviewed.  They are negative to the above problem except as previously stated.  Vital Signs: BP 118/57 mmHg  Pulse 57  Ht 5\' 5"  (1.651 m)  Wt 167 lb (75.751 kg)  BMI 27.79 kg/m2  SpO2 98%  Physical Exam Patient is in NAD HEENT:  Normocephalic, atraumatic. EOMI, PERRLA.  Neck: JVP is normal   Lungs: clear to auscultation. No rales no wheezes.  Heart: Regular rate and rhythm. Normal S1, S2. No S3.  III/VI systolic murmr  Abdomen:  Supple, nontender. Normal bowel sounds. No masses. No hepatomegaly.   Extremities:   1+ LE edema Musculoskeletal :moving all extremities.  Neuro:   alert and oriented x3.  CN II-XII grossly intact.  EKG:  SB 55 Anteroseptal MI  T wave inversion I, AVL; Biphasic V4-V6 Assessment and Plan: 1.  Acute on chronic systolic CHF.  Volume is up some  She will take an additoinal lasix beyond normal sched  Labs on Monday to review     2.  CAD   No symptoms to suggest angina.    2.  HTN Continue meds    3.  HL  Continue statin     4.  PVOD  Follow up with T Early   5.  CKD  Encouraged her to f/u with Edrick Oh.    5.  AS  Follow    6.  CKD Repeat BMET   7.  Hypothyroidism.  Followed by Dr Chalmers Cater.    \

## 2014-09-06 ENCOUNTER — Other Ambulatory Visit: Payer: Medicare Other

## 2014-09-06 ENCOUNTER — Other Ambulatory Visit (INDEPENDENT_AMBULATORY_CARE_PROVIDER_SITE_OTHER): Payer: Medicare Other

## 2014-09-06 DIAGNOSIS — I509 Heart failure, unspecified: Secondary | ICD-10-CM

## 2014-09-06 DIAGNOSIS — E785 Hyperlipidemia, unspecified: Secondary | ICD-10-CM | POA: Diagnosis not present

## 2014-09-06 DIAGNOSIS — R0602 Shortness of breath: Secondary | ICD-10-CM

## 2014-09-06 DIAGNOSIS — I10 Essential (primary) hypertension: Secondary | ICD-10-CM | POA: Diagnosis not present

## 2014-09-06 LAB — BRAIN NATRIURETIC PEPTIDE: PRO B NATRI PEPTIDE: 972 pg/mL — AB (ref 0.0–100.0)

## 2014-09-07 LAB — BASIC METABOLIC PANEL
BUN: 43 mg/dL — ABNORMAL HIGH (ref 6–23)
CO2: 25 mEq/L (ref 19–32)
Calcium: 9.2 mg/dL (ref 8.4–10.5)
Chloride: 100 mEq/L (ref 96–112)
Creatinine, Ser: 1.7 mg/dL — ABNORMAL HIGH (ref 0.4–1.2)
GFR: 30.82 mL/min — AB (ref >60.00)
Glucose, Bld: 313 mg/dL — ABNORMAL HIGH (ref 70–99)
POTASSIUM: 4.8 meq/L (ref 3.5–5.1)
SODIUM: 136 meq/L (ref 135–145)

## 2014-09-08 ENCOUNTER — Other Ambulatory Visit: Payer: Self-pay | Admitting: *Deleted

## 2014-09-08 DIAGNOSIS — R0602 Shortness of breath: Secondary | ICD-10-CM

## 2014-09-22 ENCOUNTER — Encounter (HOSPITAL_COMMUNITY): Payer: Self-pay | Admitting: Cardiology

## 2014-09-23 ENCOUNTER — Encounter (HOSPITAL_COMMUNITY): Payer: Self-pay | Admitting: Cardiology

## 2014-10-12 ENCOUNTER — Other Ambulatory Visit (INDEPENDENT_AMBULATORY_CARE_PROVIDER_SITE_OTHER): Payer: Medicare Other

## 2014-10-12 ENCOUNTER — Other Ambulatory Visit: Payer: Medicare Other

## 2014-10-12 DIAGNOSIS — R0602 Shortness of breath: Secondary | ICD-10-CM

## 2014-10-12 LAB — BASIC METABOLIC PANEL
BUN: 46 mg/dL — ABNORMAL HIGH (ref 6–23)
CO2: 28 mEq/L (ref 19–32)
CREATININE: 1.7 mg/dL — AB (ref 0.4–1.2)
Calcium: 9.3 mg/dL (ref 8.4–10.5)
Chloride: 101 mEq/L (ref 96–112)
GFR: 32.3 mL/min — AB (ref >60.00)
Glucose, Bld: 166 mg/dL — ABNORMAL HIGH (ref 70–99)
POTASSIUM: 4.4 meq/L (ref 3.5–5.1)
Sodium: 138 mEq/L (ref 135–145)

## 2014-10-28 ENCOUNTER — Ambulatory Visit (HOSPITAL_BASED_OUTPATIENT_CLINIC_OR_DEPARTMENT_OTHER): Payer: Medicare Other

## 2014-10-28 DIAGNOSIS — D518 Other vitamin B12 deficiency anemias: Secondary | ICD-10-CM

## 2014-10-28 MED ORDER — CYANOCOBALAMIN 1000 MCG/ML IJ SOLN
1000.0000 ug | Freq: Once | INTRAMUSCULAR | Status: AC
  Administered 2014-10-28: 1000 ug via INTRAMUSCULAR

## 2014-10-28 NOTE — Patient Instructions (Signed)

## 2014-11-10 ENCOUNTER — Other Ambulatory Visit: Payer: Self-pay | Admitting: Internal Medicine

## 2014-11-15 DIAGNOSIS — Z23 Encounter for immunization: Secondary | ICD-10-CM | POA: Diagnosis not present

## 2014-11-15 DIAGNOSIS — I10 Essential (primary) hypertension: Secondary | ICD-10-CM | POA: Diagnosis not present

## 2014-11-15 DIAGNOSIS — E039 Hypothyroidism, unspecified: Secondary | ICD-10-CM | POA: Diagnosis not present

## 2014-11-15 DIAGNOSIS — E109 Type 1 diabetes mellitus without complications: Secondary | ICD-10-CM | POA: Diagnosis not present

## 2014-11-26 ENCOUNTER — Encounter: Payer: Self-pay | Admitting: Vascular Surgery

## 2014-11-30 ENCOUNTER — Encounter: Payer: Self-pay | Admitting: Vascular Surgery

## 2014-11-30 ENCOUNTER — Ambulatory Visit (INDEPENDENT_AMBULATORY_CARE_PROVIDER_SITE_OTHER): Payer: Medicare Other | Admitting: Vascular Surgery

## 2014-11-30 ENCOUNTER — Ambulatory Visit (INDEPENDENT_AMBULATORY_CARE_PROVIDER_SITE_OTHER)
Admission: RE | Admit: 2014-11-30 | Discharge: 2014-11-30 | Disposition: A | Discharge status: Home / Self Care | Payer: Medicare Other | Source: Ambulatory Visit | Attending: Vascular Surgery | Admitting: Vascular Surgery

## 2014-11-30 ENCOUNTER — Ambulatory Visit (HOSPITAL_COMMUNITY)
Admission: RE | Admit: 2014-11-30 | Discharge: 2014-11-30 | Disposition: A | Discharge status: Home / Self Care | Payer: Medicare Other | Source: Ambulatory Visit | Attending: Vascular Surgery | Admitting: Vascular Surgery

## 2014-11-30 VITALS — BP 172/49 | HR 71 | Ht 65.0 in | Wt 163.0 lb

## 2014-11-30 DIAGNOSIS — I70203 Unspecified atherosclerosis of native arteries of extremities, bilateral legs: Secondary | ICD-10-CM

## 2014-11-30 DIAGNOSIS — Z48812 Encounter for surgical aftercare following surgery on the circulatory system: Secondary | ICD-10-CM

## 2014-11-30 DIAGNOSIS — I739 Peripheral vascular disease, unspecified: Secondary | ICD-10-CM | POA: Diagnosis not present

## 2014-11-30 NOTE — Progress Notes (Signed)
Here today for follow-up of her diffuse percussion occlusive disease. She has looks quite good today. She has known extreme cardiac dysfunction and lives in congestive heart failure to some degree. She denies any tissue loss on her lower extremities and his been no walking with no severe claudication. She is limited by her pulmonary status.  Past Medical History  Diagnosis Date  . HTN (hypertension)   . Hypothyroidism   . History of recurrent UTIs   . CAD (coronary artery disease)     s/p CABG x3 in 1993; last cath in 2010  . Scleroderma   . Bilateral carpal tunnel syndrome 1970s  . 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 Followed by Dr. Donnetta Hutching  . Claudication   . Aortic stenosis     0.8cm2 on echo in 10/2011  . Hyperlipidemia     on Lipitor  . Cat bite 2009    with MRSA; required I & D  . Heart murmur   . Blood transfusion     no reaction from transfusion; "when I had heart surgery" (08/28/2012)  . GERD (gastroesophageal reflux disease)   . CHF (congestive heart failure) Aug. 2012    Echo 10/2011: Mild LVH, EF 30%, apex akinetic, septal HK, grade 2 diastolic dysfunction, or functionally bicuspid aortic valve, mild aortic stenosis by gradient, severe by calculated AVA-suspect A. Aortic stenosis probably moderate, trivial MR, mild LAE, mild RAE.   [name] Cellulitis   . Anemia   . B12 deficiency anemia     B 12 injection "monthly since 05/2012" (08/28/2012)  . Iron deficiency anemia 06/13/2012    "iron infusion" (08/28/2012  . Anginal pain     "history; not currently" (08/28/2012)  . Myocardial infarction 1986    "silent"  . History of bronchitis     "I've had it twice in my life" (08/28/2012)  . Exertional dyspnea     "because of the heart failure" (08/28/2012)  . Type 1 diabetes 10/1949  . Stomach ulcer 1981?    "on Tagament for ~ 1 yr" (08/28/2012)  . Seizure     ? seizure like event in 2010; workup included stress  testing which led to repeat cath.   . Arthritis     "hands, hips, all over" (08/28/2012  . Osteoarthritis of both feet   . Breast cancer     left  . Cellulitis June 2013, Nov. 2013 and Feb. 14, 2014    Left leg    History  Substance Use Topics  . Smoking status: Never Smoker   . Smokeless tobacco: Never Used  . Alcohol Use: No    Family History  Problem Relation Age of Onset  . Diabetes Mother   . Heart attack Neg Hx   . Stroke Mother   . Stroke Father   . Hypertension Mother     Allergies  Allergen Reactions  . Adhesive [Tape] Other (See Comments)    "old Johnson/Johnson adhesive tape; takes my skin off; can use paper tape"  . Zyvox [Linezolid]     Thrombocytopenia developed to 50k after several weeks of therapy  . Bactrim [Sulfamethoxazole-Trimethoprim]     Hyperkalemia and Increased creatinine  . Cephalexin Swelling and Rash  . Ciprofloxacin Swelling and Rash  . Daptomycin     Had elevated CPK on therapy, not clear that this was due to cubicin  . Vancomycin     ? If this played role  in her Acute kidney injury     Current outpatient prescriptions:  .  aspirin EC 325 MG EC tablet, Take 325 mg by mouth daily. , Disp: , Rfl:  .  atorvastatin (LIPITOR) 40 MG tablet, TAKE 1 TABLET (40 MG TOTAL) BY MOUTH AT BEDTIME., Disp: 30 tablet, Rfl: 0 .  cyanocobalamin (,VITAMIN B-12,) 1000 MCG/ML injection, Inject 1 mL (1,000 mcg total) into the muscle once. (Patient taking differently: Inject 1,000 mcg into the muscle every 8 (eight) weeks. ), Disp: 1 mL, Rfl: 0 .  docusate sodium (COLACE) 50 MG capsule, Take by mouth daily. , Disp: , Rfl:  .  Ferrous Sulfate 50 MG TBCR, Take 1 tablet by mouth 2 (two) times a week., Disp: , Rfl:  .  furosemide (LASIX) 20 MG tablet, Take 60 mg by mouth daily. Take 60 mg daily. Add 40 every 3rd day, Disp: , Rfl:  .  insulin glargine (LANTUS) 100 UNIT/ML injection, Inject 10-22 Units into the skin 2 (two) times daily. 10 U qhs and 22 U in AM daily,  Disp: , Rfl:  .  insulin lispro (HUMALOG) 100 UNIT/ML injection, Please inject subcutaneously before meals according to sliding scale, Disp: 10 mL, Rfl: 11 .  isosorbide mononitrate (IMDUR) 30 MG 24 hr tablet, TAKE 1 TABLET (30 MG TOTAL) BY MOUTH DAILY., Disp: 30 tablet, Rfl: 0 .  levothyroxine (SYNTHROID, LEVOTHROID) 112 MCG tablet, Take 100 mcg by mouth daily before breakfast. , Disp: , Rfl:  .  metoprolol succinate (TOPROL-XL) 25 MG 24 hr tablet, TAKE 1 TABLET (25 MG TOTAL) BY MOUTH DAILY., Disp: 30 tablet, Rfl: 5  BP 172/49 mmHg  Pulse 71  Ht 5\' 5"  (1.651 m)  Wt 163 lb (73.936 kg)  BMI 27.12 kg/m2  SpO2 100%  Body mass index is 27.12 kg/(m^2).       On physical exam well-developed well-nourished female no acute distress. She is having no respiratory distress today. Neurologically she is grossly intact She does have palpable left femoral to anterior tibial graft pulse runs laterally. Her right foot is well perfused. Do not feel palpable pulse in her popliteal.  Did undergo noninvasive vascular studies were also reviewed these with her. She has patency of her left femoral anterior tibial graft was slightly elevated velocities distally at 275. She does have known stenosis in the distal right superficial femoral artery which is unchanged from her prior study.  Impression and plan: Stable follow-up of severe peripheral vascular occlusive disease. Would not recommend any intervention regarding stenosis in the right superficial femoral artery since she does not have any tissue loss in her right foot. She will notify should she develop worsening symptoms. Otherwise we will see her in 6 months with repeat noninvasive studies

## 2014-12-23 ENCOUNTER — Ambulatory Visit: Payer: Medicare Other

## 2014-12-24 ENCOUNTER — Telehealth: Payer: Self-pay | Admitting: Hematology and Oncology

## 2014-12-24 NOTE — Telephone Encounter (Signed)
returned pt call and r/s missed appt...pt ok with new d.t

## 2014-12-29 ENCOUNTER — Ambulatory Visit (HOSPITAL_BASED_OUTPATIENT_CLINIC_OR_DEPARTMENT_OTHER): Payer: Medicare Other

## 2014-12-29 DIAGNOSIS — D518 Other vitamin B12 deficiency anemias: Secondary | ICD-10-CM | POA: Diagnosis not present

## 2014-12-29 MED ORDER — CYANOCOBALAMIN 1000 MCG/ML IJ SOLN
1000.0000 ug | Freq: Once | INTRAMUSCULAR | Status: AC
  Administered 2014-12-29: 1000 ug via INTRAMUSCULAR

## 2015-01-09 ENCOUNTER — Other Ambulatory Visit: Payer: Self-pay | Admitting: Internal Medicine

## 2015-01-11 NOTE — Telephone Encounter (Signed)
Fay Records, MD at 09/02/2014 4:57 PM  isosorbide mononitrate (IMDUR) 30 MG 24 hr tablet TAKE 1 TABLET (30 MG TOTAL) BY MOUTH DAILY Patient Instructions     Your physician recommends that you return for lab work on Monday, 09/06/14 (BMET, BNP) Your physician recommends that you continue on your current medications as directed. Please refer to the Current Medication list given to you today   Notes Recorded by Fay Records, MD on 10/14/2014 at 3:13 PM Labs are relatively stable I would not make any changes.

## 2015-01-13 ENCOUNTER — Telehealth: Payer: Self-pay | Admitting: Internal Medicine

## 2015-01-13 NOTE — Telephone Encounter (Signed)
New Message  Pt wants to discuss medication- Isosorbide. Please call back and discuss.

## 2015-01-13 NOTE — Telephone Encounter (Signed)
Patient had automated refill from [facility] and she is upset that it was requested by them without her request. She states she is not out of the medication and they did it without her knowledge.  Adv that it a feature the pharmacies use to help ensure compliance. Customer convenience. She states [facility] said they did not request the medication. Adv patient that is the only way we would generate a refill from the refill dept.  She will discuss with [facility]. Thinks she is due for appt with Dr. Harrington Challenger, last seen in Nov. Shes doing ok, just needs routine visit.  Will call back to schedule for in May.   Adv her to call back with any other issues/concerns.

## 2015-02-07 ENCOUNTER — Other Ambulatory Visit: Payer: Self-pay

## 2015-02-07 MED ORDER — FUROSEMIDE 20 MG PO TABS: 60.0000 mg | ORAL_TABLET | Freq: Every day | ORAL | Status: DC

## 2015-02-17 ENCOUNTER — Ambulatory Visit (HOSPITAL_BASED_OUTPATIENT_CLINIC_OR_DEPARTMENT_OTHER): Payer: Medicare Other

## 2015-02-17 VITALS — BP 104/41 | HR 61 | Temp 97.6°F

## 2015-02-17 DIAGNOSIS — D518 Other vitamin B12 deficiency anemias: Secondary | ICD-10-CM

## 2015-02-17 MED ORDER — CYANOCOBALAMIN 1000 MCG/ML IJ SOLN
1000.0000 ug | Freq: Once | INTRAMUSCULAR | Status: AC
  Administered 2015-02-17: 1000 ug via INTRAMUSCULAR

## 2015-02-17 NOTE — Patient Instructions (Signed)

## 2015-02-18 ENCOUNTER — Telehealth: Payer: Self-pay | Admitting: Internal Medicine

## 2015-02-18 NOTE — Telephone Encounter (Signed)
Will forward to Dr. Harrington Challenger and Caren Hazy her nurse to see if patient needs labs prior to appointment on 04/11/15.

## 2015-02-18 NOTE — Telephone Encounter (Signed)
New Message   Patient called to scheduled visit with Dr. Harrington Challenger with Labs, there are no labs ordered, please give patient a call to schedule labs.  Thanks

## 2015-02-19 NOTE — Telephone Encounter (Signed)
CBC, BMET, BNP, lipid panel  AST

## 2015-03-03 ENCOUNTER — Telehealth: Payer: Self-pay | Admitting: Internal Medicine

## 2015-03-03 DIAGNOSIS — I5032 Chronic diastolic (congestive) heart failure: Secondary | ICD-10-CM

## 2015-03-03 DIAGNOSIS — E785 Hyperlipidemia, unspecified: Secondary | ICD-10-CM

## 2015-03-03 DIAGNOSIS — D509 Iron deficiency anemia, unspecified: Secondary | ICD-10-CM

## 2015-03-03 NOTE — Telephone Encounter (Signed)
This has been addressed in a separate phone note. Labs have been ordered and scheduled.

## 2015-03-03 NOTE — Telephone Encounter (Signed)
Patient is wanting to know status of a handicap placard form she had brought to the office for Dr. Harrington Challenger. In previous note Dr. Harrington Challenger ordered CBC, BMET, BNP, Lipid panel, and AST for labs prior to routine appointment.  Scheduled patient for labs before her appointment next month. Patient is wanting to see if she can have a TSH level done also.  Patient states, " My veins are very bad. I would like to get all my labs done at once, so I can avoid getting stuck twice." Will forward to Dr. Harrington Challenger to see if a TSH level can be ordered and to find out the status of the handicap placard form.

## 2015-03-03 NOTE — Telephone Encounter (Signed)
F/u   Pt waiting on call from nurse.

## 2015-03-03 NOTE — Telephone Encounter (Signed)
New problem   Pt want to know the status of the handicap placard form she brought to office.

## 2015-03-03 NOTE — Telephone Encounter (Signed)
Yes  OK to add TSH I have not seen handicap parking application yet

## 2015-03-09 ENCOUNTER — Other Ambulatory Visit: Payer: Self-pay | Admitting: Internal Medicine

## 2015-03-10 ENCOUNTER — Telehealth: Payer: Self-pay | Admitting: Internal Medicine

## 2015-03-10 NOTE — Telephone Encounter (Signed)
Walk in pt form-Handicapped form dropped off-[place] back Monday 5.30.16/km

## 2015-03-16 ENCOUNTER — Other Ambulatory Visit: Payer: Self-pay | Admitting: Internal Medicine

## 2015-03-16 DIAGNOSIS — E039 Hypothyroidism, unspecified: Secondary | ICD-10-CM

## 2015-03-16 NOTE — Telephone Encounter (Signed)
New Message  Pt called to discuss two things   1. Med refill for furosemide. Pt states that the way that the Rx is filled the pharmacy is informing her that they can not refill because it was only written for an 11 day supply. Sent the to refill dept. Spoke with Mindy. Refill department states that they will review and either call the pt back or the nurse.   2. Form that was left to have signed. Application for a handicapped sticker. The first was lost and the second she brought up last Thursday 03/10/2015. Pt was calling to check on it. Called [employer] Records. Spoke with Maudie Mercury found that the pt is waiting on Dr. Harrington Challenger to sign off.   Informed Pt of what the Reps are waiting on and what they are doing to assist her. Pt Verbalized understanding.

## 2015-03-16 NOTE — Telephone Encounter (Signed)
Refill sent in for Lasix and added Lab for TSH for lab visit later this month. Dr. Harrington Challenger will be in the office tomorrow and will give her application for handicapped sticker. Patient needs to be called after application is filled out.

## 2015-03-17 MED ORDER — FUROSEMIDE 20 MG PO TABS: 60.0000 mg | ORAL_TABLET | Freq: Every day | ORAL | Status: DC

## 2015-03-17 NOTE — Telephone Encounter (Signed)
Application for Handicap Placard completed and signed by Dr Harrington Challenger left at front desk, pt advised

## 2015-03-23 NOTE — Telephone Encounter (Signed)
Informed by triage nurse that this has been handled--- Handicap form placed at front desk for patient to pick up, tsh added to lab appointment

## 2015-04-05 ENCOUNTER — Other Ambulatory Visit: Payer: Medicare Other

## 2015-04-06 ENCOUNTER — Other Ambulatory Visit: Payer: Medicare Other

## 2015-04-06 ENCOUNTER — Other Ambulatory Visit (INDEPENDENT_AMBULATORY_CARE_PROVIDER_SITE_OTHER): Payer: Medicare Other

## 2015-04-06 DIAGNOSIS — D509 Iron deficiency anemia, unspecified: Secondary | ICD-10-CM

## 2015-04-06 DIAGNOSIS — E039 Hypothyroidism, unspecified: Secondary | ICD-10-CM | POA: Diagnosis not present

## 2015-04-06 DIAGNOSIS — I5032 Chronic diastolic (congestive) heart failure: Secondary | ICD-10-CM | POA: Diagnosis not present

## 2015-04-06 DIAGNOSIS — E785 Hyperlipidemia, unspecified: Secondary | ICD-10-CM | POA: Diagnosis not present

## 2015-04-06 LAB — CBC WITH DIFFERENTIAL/PLATELET
Basophils Absolute: 0 10*3/uL (ref 0.0–0.1)
Basophils Relative: 0.7 % (ref 0.0–3.0)
EOS ABS: 0.3 10*3/uL (ref 0.0–0.7)
Eosinophils Relative: 5 % (ref 0.0–5.0)
HEMATOCRIT: 45.5 % (ref 36.0–46.0)
Hemoglobin: 14.9 g/dL (ref 12.0–15.0)
Lymphocytes Relative: 18.3 % (ref 12.0–46.0)
Lymphs Abs: 1.1 10*3/uL (ref 0.7–4.0)
MCHC: 32.8 g/dL (ref 30.0–36.0)
MCV: 92.3 fl (ref 78.0–100.0)
MONO ABS: 0.7 10*3/uL (ref 0.1–1.0)
Monocytes Relative: 11.7 % (ref 3.0–12.0)
NEUTROS PCT: 64.3 % (ref 43.0–77.0)
Neutro Abs: 4 10*3/uL (ref 1.4–7.7)
PLATELETS: 213 10*3/uL (ref 150.0–400.0)
RBC: 4.93 Mil/uL (ref 3.87–5.11)
RDW: 15.2 % (ref 11.5–15.5)
WBC: 6.2 10*3/uL (ref 4.0–10.5)

## 2015-04-06 LAB — BASIC METABOLIC PANEL WITH GFR
BUN: 37 mg/dL — ABNORMAL HIGH (ref 6–23)
CO2: 31 meq/L (ref 19–32)
Calcium: 9.7 mg/dL (ref 8.4–10.5)
Chloride: 102 meq/L (ref 96–112)
Creatinine, Ser: 1.59 mg/dL — ABNORMAL HIGH (ref 0.40–1.20)
GFR: 34.14 mL/min — ABNORMAL LOW
Glucose, Bld: 213 mg/dL — ABNORMAL HIGH (ref 70–99)
Potassium: 4.4 meq/L (ref 3.5–5.1)
Sodium: 139 meq/L (ref 135–145)

## 2015-04-06 LAB — LIPID PANEL
CHOL/HDL RATIO: 4
Cholesterol: 185 mg/dL (ref 0–200)
HDL: 41.2 mg/dL (ref >39.00)
LDL CALC: 129 mg/dL — AB (ref 0–99)
NonHDL: 143.8
Triglycerides: 76 mg/dL (ref 0.0–149.0)
VLDL: 15.2 mg/dL (ref 0.0–40.0)

## 2015-04-06 LAB — BRAIN NATRIURETIC PEPTIDE: Pro B Natriuretic peptide (BNP): 1041 pg/mL — ABNORMAL HIGH (ref 0.0–100.0)

## 2015-04-06 LAB — TSH: TSH: 0.78 u[IU]/mL (ref 0.35–4.50)

## 2015-04-06 LAB — AST: AST: 15 U/L (ref 0–37)

## 2015-04-11 ENCOUNTER — Encounter: Payer: Self-pay | Admitting: Internal Medicine

## 2015-04-11 ENCOUNTER — Ambulatory Visit (INDEPENDENT_AMBULATORY_CARE_PROVIDER_SITE_OTHER): Payer: Medicare Other | Admitting: Internal Medicine

## 2015-04-11 VITALS — BP 128/60 | HR 65 | Ht 65.0 in | Wt 165.8 lb

## 2015-04-11 DIAGNOSIS — R0602 Shortness of breath: Secondary | ICD-10-CM

## 2015-04-11 DIAGNOSIS — I70203 Unspecified atherosclerosis of native arteries of extremities, bilateral legs: Secondary | ICD-10-CM | POA: Diagnosis not present

## 2015-04-11 NOTE — Progress Notes (Signed)
Cardiology Office Note   Date:  04/11/2015   ID:  [name], [name] Jul 22, 1946, MRN [id number]  PCP:  Ivor Costa, MD  Cardiologist:   Dorris Carnes, MD   No chief complaint on file.     History of Present Illness: [name] is a 69 y.o. female with a history of CAD (s/p CABG- cath in 09/2011 showed patent LIMA to LAD with occlusion of distal LAD; High grade stenosis of nondominant RCA; Signif disease of prox LCx; SVG to L PDA occluded, SVG to OM patent), Aortic stenosis, DM, dyslipidemia, PV)D and CKD, Echo 05/2013: Mild LVH, EF 30%, RVEF depressed; mod aortic stenosis)   I saw the pt in November 2015  Past month she was at a  [place] headed   Was at 3000 feet Better once she got home  Has not had since  She denies signif SOB  NO CP   Tired     Current Outpatient Prescriptions  Medication Sig Dispense Refill  . aspirin EC 325 MG EC tablet Take 325 mg by mouth daily.     [name] atorvastatin (LIPITOR) 40 MG tablet TAKE 1 TABLET (40 MG TOTAL) BY MOUTH AT BEDTIME. 30 tablet 0  . cyanocobalamin (,VITAMIN B-12,) 1000 MCG/ML injection Inject 1 mL (1,000 mcg total) into the muscle once. (Patient taking differently: Inject 1,000 mcg into the muscle every 8 (eight) weeks. ) 1 mL 0  . furosemide (LASIX) 20 MG tablet Take 3 tablets (60 mg total) by mouth daily. Take 60 mg daily. Add 40 every 3rd day 110 tablet 1  . insulin glargine (LANTUS) 100 UNIT/ML injection Inject 10-22 Units into the skin 2 (two) times daily. 10 U qhs and 22 U in AM daily    . isosorbide mononitrate (IMDUR) 30 MG 24 hr tablet TAKE 1 TABLET BY MOUTH EVERY DAY (CALL TO SCHEDULE APPT!!) 90 tablet 1  . LEVEMIR 100 UNIT/ML injection Inject into the skin. 10-22 units injected 2 times daily    . levothyroxine (SYNTHROID, LEVOTHROID) 112 MCG tablet Take 100 mcg by mouth daily before breakfast.     . metoprolol succinate (TOPROL-XL) 25 MG 24 hr tablet TAKE 1 TABLET  (25 MG TOTAL) BY MOUTH DAILY. 30 tablet 1   No current facility-administered medications for this visit.    Allergies:   Adhesive; Zyvox; Bactrim; Cephalexin; Ciprofloxacin; Daptomycin; and Vancomycin   Past Medical History  Diagnosis Date  . HTN (hypertension)   . Hypothyroidism   . History of recurrent UTIs   . CAD (coronary artery disease)     s/p CABG x3 in 1993; last cath in 2010  . Scleroderma   . Bilateral carpal tunnel syndrome 1970s  . 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 Followed by Dr. Donnetta Hutching  . Claudication   . Aortic stenosis     0.8cm2 on echo in 10/2011  . Hyperlipidemia     on Lipitor  . Cat bite 2009    with MRSA; required I & D  . Heart murmur   . Blood transfusion     no reaction from transfusion; "when I had heart surgery" (08/28/2012)  . GERD (gastroesophageal reflux disease)   . CHF (congestive heart failure) Aug. 2012    Echo 10/2011: Mild LVH, EF 30%, apex akinetic, septal HK, grade 2 diastolic dysfunction, or functionally bicuspid aortic valve, mild aortic  stenosis by gradient, severe by calculated AVA-suspect A. Aortic stenosis probably moderate, trivial MR, mild LAE, mild RAE.   [name] Cellulitis   . Anemia   . B12 deficiency anemia     B 12 injection "monthly since 05/2012" (08/28/2012)  . Iron deficiency anemia 06/13/2012    "iron infusion" (08/28/2012  . Anginal pain     "history; not currently" (08/28/2012)  . Myocardial infarction 1986    "silent"  . History of bronchitis     "I've had it twice in my life" (08/28/2012)  . Exertional dyspnea     "because of the heart failure" (08/28/2012)  . Type 1 diabetes 10/1949  . Stomach ulcer 1981?    "on Tagament for ~ 1 yr" (08/28/2012)  . Seizure     ? seizure like event in 2010; workup included stress testing which led to repeat cath.   . Arthritis     "hands, hips, all over" (08/28/2012  . Osteoarthritis of both feet   . Breast  cancer     left  . Cellulitis June 2013, Nov. 2013 and Feb. 14, 2014    Left leg    Past Surgical History  Procedure Laterality Date  . Carpal tunnel release  ~ 1970's    bilaterally  . Mastectomy  11/1982    Left Breast  . Vitrectomy  1990's-2004    "both eyes; I've had total of 4"  . Tubal ligation  1984  . Tonsillectomy  1952    "?adenoids"   . Appendectomy  1991  . Femoral bypass      left leg "related to transmetatarsal amputation" (08/28/2012)  . Breast biopsy with sentinel lymph node biopsy and needle localization  1984  . Coronary artery bypass graft  1992    LIMA to LAD, SVG to distal LCX & SVG to Marginal  . Cardiac catheterization  2010    LIMA to LAD patent yet with occluded distal LAD after graft insertion & retrograde is occluded. 3-4 septal perforators arise &are patent. SVG to a large marginal patent; SVG presumably to the distal dominant LCX is occluded, moderately high grade stenosis of the AV circumflex, but with distal stenoses involving a severely diseased marginal branch not amenable  to PCI  nor is inferior branch   . Cardiac catheterization  10/2011    "unsuccessful attempt of stenting" (08/28/2012)  . Cardiac catheterization  1992; 09/2011  . Breast biopsy    . Trigger finger release  1980's    right thumb  . Incision and drainage of wound  ~ 1967    "infection in left foot" (08/28/2012)  . Incision and drainage of wound  2009    "3 ORs on my right hand after cat bite" (08/28/2012)  . 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 Cataract extraction w/ intraocular lens  implant, bilateral  1990's  . Amputation Right 05/27/2013    Procedure: AMPUTATION DIGIT FIFTH TOE;  Surgeon: Rosetta Posner, MD;  Location: [facility];  Service: Vascular;  Laterality: Right;  . Left and right heart catheterization with coronary angiogram N/A 09/17/2011    Procedure: LEFT AND RIGHT HEART CATHETERIZATION WITH CORONARY ANGIOGRAM;  Surgeon: Hillary Bow,  MD;  Location[facility] CATH LAB;  Service: Cardiovascular;  Laterality: N/A;  . Percutaneous coronary stent intervention (pci-s) N/A 11/15/2011    Procedure: PERCUTANEOUS CORONARY STENT INTERVENTION (PCI-S);  Surgeon: Hillary Bow, MD;  Location: [facility] CATH LAB;  Service: Cardiovascular;  Laterality: N/A;  . Lower extremity angiogram N/A 05/25/2013    Procedure: LOWER EXTREMITY ANGIOGRAM POSS INTERVENTION;  Surgeon: Conrad , MD;  Location: [facility] CATH LAB;  Service: Cardiovascular;  Laterality: N/A;     Social History:  The patient  reports that she has never smoked. She has never used smokeless tobacco. She reports that she does not drink alcohol or use illicit drugs.   Family History:  The patient's family history includes Diabetes in her mother; Hypertension in her mother; Stroke in her father and mother. There is no history of Heart attack.    ROS:  Please see the history of present illness. All other systems are reviewed and  Negative to the above problem except as noted.    PHYSICAL EXAM: VS:  BP 128/60 mmHg  Pulse 65  Ht 5\' 5"  (1.651 m)  Wt 165 lb 12.8 oz (75.206 kg)  BMI 27.59 kg/m2  SpO2 99%  GEN: Well nourished, well developed, in no acute distress HEENT: normal Neck: no JVD, carotid bruits, or masses Cardiac: RRR; no murmurs, rubs, or gallops,  Tr to 1+ edema  Respiratory:  clear to auscultation bilaterally, normal work of breathing GI: soft, nontender, nondistended, + BS  No hepatomegaly  MS: no deformity Moving all extremities   Skin: warm and dry, no rash Neuro:  Strength and sensation are intact Psych: euthymic mood, full affect   EKG:  EKG is ordered today.  Sr 74 with PVCs  Septal infarct     Lipid Panel    Component Value Date/Time   CHOL 185 04/06/2015 1038   TRIG 76.0 04/06/2015 1038   HDL 41.20 04/06/2015 1038   CHOLHDL 4 04/06/2015 1038   VLDL 15.2 04/06/2015 1038   LDLCALC 129* 04/06/2015 1038   LDLDIRECT 132.7 04/22/2007 0815      Wt Readings from  Last 3 Encounters:  04/11/15 165 lb 12.8 oz (75.206 kg)  11/30/14 163 lb (73.936 kg)  09/02/14 167 lb (75.751 kg)      ASSESSMENT AND PLAN:  1.  CAD  No symptoms to sugg angina  Pt with severe diffuse dz  Continue medical rx  2.  Chronic systolic CHF  Volume status is not bad  WIll review med choices from past    3.  PVOD  Followed by T Early    4.  HL  Adimts to not taking lipitor regulary       Disposition:   FU with me in 6 months  Signed, Dorris Carnes, MD  04/11/2015 10:12 AM    [address], [place], [place]  [address] Phone: [phone]; Fax: [phone]

## 2015-04-11 NOTE — Patient Instructions (Signed)
Medication Instructions:  Your physician recommends that you continue on your current medications as directed. Please refer to the Current Medication list given to you today.   Labwork: none  Testing/Procedures: none  Follow-Up: Your physician wants you to follow-up in: 6 months.  You will receive a reminder letter in the mail two months in advance. If you don't receive a letter, please call our office to schedule the follow-up appointment.       

## 2015-04-14 ENCOUNTER — Ambulatory Visit (HOSPITAL_BASED_OUTPATIENT_CLINIC_OR_DEPARTMENT_OTHER): Payer: Medicare Other

## 2015-04-14 VITALS — BP 130/45 | HR 70 | Temp 97.9°F

## 2015-04-14 DIAGNOSIS — D518 Other vitamin B12 deficiency anemias: Secondary | ICD-10-CM | POA: Diagnosis present

## 2015-04-14 MED ORDER — CYANOCOBALAMIN 1000 MCG/ML IJ SOLN
1000.0000 ug | Freq: Once | INTRAMUSCULAR | Status: AC
  Administered 2015-04-14: 1000 ug via INTRAMUSCULAR

## 2015-05-19 ENCOUNTER — Other Ambulatory Visit: Payer: Self-pay | Admitting: Internal Medicine

## 2015-05-26 ENCOUNTER — Other Ambulatory Visit: Payer: Self-pay | Admitting: *Deleted

## 2015-05-26 DIAGNOSIS — Z48812 Encounter for surgical aftercare following surgery on the circulatory system: Secondary | ICD-10-CM

## 2015-05-26 DIAGNOSIS — I739 Peripheral vascular disease, unspecified: Secondary | ICD-10-CM

## 2015-06-05 ENCOUNTER — Other Ambulatory Visit: Payer: Self-pay | Admitting: Internal Medicine

## 2015-06-06 ENCOUNTER — Encounter: Payer: Self-pay | Admitting: Vascular Surgery

## 2015-06-06 NOTE — Telephone Encounter (Signed)
Is the requested sig for the furosemide still how the patient should be taking this? Please advise. Thanks, MI

## 2015-06-07 ENCOUNTER — Ambulatory Visit (HOSPITAL_COMMUNITY)
Admission: RE | Admit: 2015-06-07 | Discharge: 2015-06-07 | Disposition: A | Discharge status: Home / Self Care | Payer: Medicare Other | Source: Ambulatory Visit | Attending: Vascular Surgery | Admitting: Vascular Surgery

## 2015-06-07 ENCOUNTER — Encounter: Payer: Self-pay | Admitting: Vascular Surgery

## 2015-06-07 ENCOUNTER — Ambulatory Visit (INDEPENDENT_AMBULATORY_CARE_PROVIDER_SITE_OTHER): Payer: Medicare Other | Admitting: Vascular Surgery

## 2015-06-07 VITALS — BP 150/64 | HR 52 | Temp 98.0°F | Resp 16 | Ht 63.5 in | Wt 167.1 lb

## 2015-06-07 DIAGNOSIS — Z48812 Encounter for surgical aftercare following surgery on the circulatory system: Secondary | ICD-10-CM | POA: Insufficient documentation

## 2015-06-07 DIAGNOSIS — Z95828 Presence of other vascular implants and grafts: Secondary | ICD-10-CM | POA: Diagnosis not present

## 2015-06-07 DIAGNOSIS — I70203 Unspecified atherosclerosis of native arteries of extremities, bilateral legs: Secondary | ICD-10-CM | POA: Diagnosis not present

## 2015-06-07 DIAGNOSIS — I739 Peripheral vascular disease, unspecified: Secondary | ICD-10-CM | POA: Diagnosis not present

## 2015-06-07 NOTE — Telephone Encounter (Signed)
OK for refill of lasix

## 2015-06-07 NOTE — Progress Notes (Signed)
Patient resents today for follow-up of bilateral lower extremity revascularization. She looks quite good today. She is walking with a walker. She has had no recent exacerbation of her chronic congestive heart failure. She does have a great deal of swelling that she can have some weeping from the old fifth digit amputation from 2 years ago. She also has serous fluid leakage from  Skin cracks in her calves. She does not walk to the level of claudication. Does have severe shortness of breath particularly with the high temperatures and high humidity  Past Medical History  Diagnosis Date  . HTN (hypertension)   . Hypothyroidism   . History of recurrent UTIs   . CAD (coronary artery disease)     s/p CABG x3 in 1993; last cath in 2010  . Scleroderma   . Bilateral carpal tunnel syndrome 1970s  . 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 Followed by Dr. Donnetta Hutching  . Claudication   . Aortic stenosis     0.8cm2 on echo in 10/2011  . Hyperlipidemia     on Lipitor  . Cat bite 2009    with MRSA; required I & D  . Heart murmur   . Blood transfusion     no reaction from transfusion; "when I had heart surgery" (08/28/2012)  . GERD (gastroesophageal reflux disease)   . CHF (congestive heart failure) Aug. 2012    Echo 10/2011: Mild LVH, EF 30%, apex akinetic, septal HK, grade 2 diastolic dysfunction, or functionally bicuspid aortic valve, mild aortic stenosis by gradient, severe by calculated AVA-suspect A. Aortic stenosis probably moderate, trivial MR, mild LAE, mild RAE.   [name] Cellulitis   . Anemia   . B12 deficiency anemia     B 12 injection "monthly since 05/2012" (08/28/2012)  . Iron deficiency anemia 06/13/2012    "iron infusion" (08/28/2012  . Anginal pain     "history; not currently" (08/28/2012)  . Myocardial infarction 1986    "silent"  . History of bronchitis     "I've had it twice in my life" (08/28/2012)  . Exertional dyspnea    "because of the heart failure" (08/28/2012)  . Type 1 diabetes 10/1949  . Stomach ulcer 1981?    "on Tagament for ~ 1 yr" (08/28/2012)  . Seizure     ? seizure like event in 2010; workup included stress testing which led to repeat cath.   . Arthritis     "hands, hips, all over" (08/28/2012  . Osteoarthritis of both feet   . Breast cancer     left  . Cellulitis June 2013, Nov. 2013 and Feb. 14, 2014    Left leg    Social History  Substance Use Topics  . Smoking status: Never Smoker   . Smokeless tobacco: Never Used  . Alcohol Use: No    Family History  Problem Relation Age of Onset  . Diabetes Mother   . Heart attack Neg Hx   . Stroke Mother   . Stroke Father   . Hypertension Mother     Allergies  Allergen Reactions  . Adhesive [Tape] Other (See Comments)    "old [employer] adhesive tape; takes my skin off; can use paper tape"  . Zyvox [Linezolid]     Thrombocytopenia developed to 50k after several weeks of therapy  . Bactrim [Sulfamethoxazole-Trimethoprim]     Hyperkalemia and Increased creatinine  . Cephalexin Swelling and Rash  .  Ciprofloxacin Swelling and Rash  . Daptomycin     Had elevated CPK on therapy, not clear that this was due to cubicin  . Vancomycin     ? If this played role in her Acute kidney injury     Current outpatient prescriptions:  .  aspirin EC 325 MG EC tablet, Take 325 mg by mouth daily. , Disp: , Rfl:  .  atorvastatin (LIPITOR) 40 MG tablet, TAKE 1 TABLET (40 MG TOTAL) BY MOUTH AT BEDTIME., Disp: 30 tablet, Rfl: 0 .  cyanocobalamin (,VITAMIN B-12,) 1000 MCG/ML injection, Inject 1 mL (1,000 mcg total) into the muscle once. (Patient taking differently: Inject 1,000 mcg into the muscle every 8 (eight) weeks. ), Disp: 1 mL, Rfl: 0 .  furosemide (LASIX) 20 MG tablet, Take 3 tablets (60 mg total) by mouth daily. Take 60 mg daily. Add 40 every 3rd day, Disp: 110 tablet, Rfl: 1 .  isosorbide mononitrate (IMDUR) 30 MG 24 hr tablet, TAKE 1 TABLET  BY MOUTH EVERY DAY (CALL TO SCHEDULE APPT!!), Disp: 90 tablet, Rfl: 1 .  LEVEMIR 100 UNIT/ML injection, Inject into the skin. 10-22 units injected 2 times daily, Disp: , Rfl:  .  levothyroxine (SYNTHROID, LEVOTHROID) 100 MCG tablet, Take 100 mcg by mouth daily., Disp: , Rfl: 5 .  metoprolol succinate (TOPROL-XL) 25 MG 24 hr tablet, TAKE 1 TABLET (25 MG TOTAL) BY MOUTH DAILY., Disp: 30 tablet, Rfl: 3  Filed Vitals:   06/07/15 1624  BP: 150/64  Pulse: 52  Temp: 98 F (36.7 C)  TempSrc: Oral  Resp: 16  Height: 5' 3.5" (1.613 m)  Weight: 167 lb 2 oz (75.807 kg)  SpO2: 98%    Body mass index is 29.14 kg/(m^2).       Physical exam alert oriented in no acute distress. She does have swelling and chronic thickening of the skin from her knees distally She has a completely healed transmetatarsal amputation on the left and fifth toe ray amputation on the right No palpable pulses on the right. She does have an easily palpable subcutaneous and anterior tibial pulse on the left  Noninvasive studies today reveal no evidence of stenosis throughout her left femoral anterior tibial bypass. She does have diffuse narrowing throughout her superficial femoral artery on the right. An area in the distal portion of this does have increased callosities in 413 cm/s.  Stable from a peripheral vascular standpoint. Did explain the stenosis in the distal superficial artery on the right. Would not recommend any intervention since she is completely asymptomatic. She likely would have a no symptoms should she'll go on to occlude this. We will continue 6 month protocol follow-up of her bypass and stenosis. She'll notify should she develop any wound problems or worsening symptoms

## 2015-06-08 NOTE — Addendum Note (Signed)
Addended by: Dorthula Rue L on: 06/08/2015 10:16 AM   Modules accepted: Orders

## 2015-06-09 ENCOUNTER — Ambulatory Visit: Payer: Medicare Other

## 2015-06-10 ENCOUNTER — Telehealth: Payer: Self-pay | Admitting: Hematology and Oncology

## 2015-06-10 NOTE — Telephone Encounter (Signed)
returned call and lvm for pt to call back to r/s °

## 2015-07-27 ENCOUNTER — Telehealth: Payer: Self-pay | Admitting: Internal Medicine

## 2015-07-27 NOTE — Telephone Encounter (Signed)
New message    Patient calling  need Dr. Harrington Challenger write a prescription for a prosthesis   H/o cancer. MD is passed away

## 2015-07-27 NOTE — Telephone Encounter (Signed)
Spoke with patient. She had a mastectomy in 1984 and never had reconstructive surgery. She gets a new silicone breast prosthesis every few years. Her cancer doctor has passed away.  She wants to know if Dr. Harrington Challenger will write the prescription that medicare requires since she has known her for so long. The company is called [employer], [place].  The patient states that the company is going to be calling to request a script from Dr. Harrington Challenger.   Patient has paid 20 percent and medicare will pay the other 80 percent when the script is obtained.

## 2015-07-28 NOTE — Telephone Encounter (Signed)
Called patient to inform Dr. Harrington Challenger will write prescription for prosthesis. Patient states that she was getting ready to call the office to see if she can be seen.  She traveled 4 hrs away last week and since Friday night has been very SOB with minimal exertion. She returned on Sunday and thought she was just worn out but it has not resolved.  Sleeping on 2 pillows, has a 5 lb wt gain over last couple weeks.  BP is 144/89, HR 72  Spoke with Cecilie Kicks, F[facility] She rec extra 40 mg of lasix today and FLEX APP appointment tomorrow. Pt informed.  She verbalizes understanding and appreciation for the lab.

## 2015-07-28 NOTE — Telephone Encounter (Signed)
OK to write prescription for prosthesis

## 2015-07-29 ENCOUNTER — Encounter: Payer: Self-pay | Admitting: Cardiology

## 2015-07-29 ENCOUNTER — Encounter (HOSPITAL_COMMUNITY): Payer: Self-pay | Admitting: Emergency Medicine

## 2015-07-29 ENCOUNTER — Emergency Department (HOSPITAL_COMMUNITY)
Admission: EM | Admit: 2015-07-29 | Discharge: 2015-07-30 | Disposition: A | Discharge status: Home / Self Care | Payer: Medicare Other | Attending: Emergency Medicine | Admitting: Emergency Medicine

## 2015-07-29 ENCOUNTER — Emergency Department (HOSPITAL_COMMUNITY): Payer: Medicare Other

## 2015-07-29 ENCOUNTER — Telehealth: Payer: Self-pay | Admitting: *Deleted

## 2015-07-29 ENCOUNTER — Ambulatory Visit (INDEPENDENT_AMBULATORY_CARE_PROVIDER_SITE_OTHER): Payer: Medicare Other | Admitting: Cardiology

## 2015-07-29 ENCOUNTER — Telehealth: Payer: Self-pay | Admitting: Internal Medicine

## 2015-07-29 VITALS — BP 130/70 | HR 60 | Ht 63.5 in | Wt 170.8 lb

## 2015-07-29 DIAGNOSIS — Z951 Presence of aortocoronary bypass graft: Secondary | ICD-10-CM | POA: Diagnosis not present

## 2015-07-29 DIAGNOSIS — I129 Hypertensive chronic kidney disease with stage 1 through stage 4 chronic kidney disease, or unspecified chronic kidney disease: Secondary | ICD-10-CM | POA: Insufficient documentation

## 2015-07-29 DIAGNOSIS — D519 Vitamin B12 deficiency anemia, unspecified: Secondary | ICD-10-CM | POA: Diagnosis not present

## 2015-07-29 DIAGNOSIS — I2699 Other pulmonary embolism without acute cor pulmonale: Secondary | ICD-10-CM

## 2015-07-29 DIAGNOSIS — I70203 Unspecified atherosclerosis of native arteries of extremities, bilateral legs: Secondary | ICD-10-CM

## 2015-07-29 DIAGNOSIS — I5032 Chronic diastolic (congestive) heart failure: Secondary | ICD-10-CM

## 2015-07-29 DIAGNOSIS — Z79899 Other long term (current) drug therapy: Secondary | ICD-10-CM | POA: Insufficient documentation

## 2015-07-29 DIAGNOSIS — Z8719 Personal history of other diseases of the digestive system: Secondary | ICD-10-CM | POA: Diagnosis not present

## 2015-07-29 DIAGNOSIS — I25119 Atherosclerotic heart disease of native coronary artery with unspecified angina pectoris: Secondary | ICD-10-CM | POA: Diagnosis not present

## 2015-07-29 DIAGNOSIS — I5023 Acute on chronic systolic (congestive) heart failure: Secondary | ICD-10-CM

## 2015-07-29 DIAGNOSIS — Z872 Personal history of diseases of the skin and subcutaneous tissue: Secondary | ICD-10-CM | POA: Diagnosis not present

## 2015-07-29 DIAGNOSIS — Z8709 Personal history of other diseases of the respiratory system: Secondary | ICD-10-CM | POA: Insufficient documentation

## 2015-07-29 DIAGNOSIS — R011 Cardiac murmur, unspecified: Secondary | ICD-10-CM | POA: Insufficient documentation

## 2015-07-29 DIAGNOSIS — J918 Pleural effusion in other conditions classified elsewhere: Secondary | ICD-10-CM | POA: Diagnosis not present

## 2015-07-29 DIAGNOSIS — M1990 Unspecified osteoarthritis, unspecified site: Secondary | ICD-10-CM | POA: Diagnosis not present

## 2015-07-29 DIAGNOSIS — Z87440 Personal history of urinary (tract) infections: Secondary | ICD-10-CM | POA: Diagnosis not present

## 2015-07-29 DIAGNOSIS — E039 Hypothyroidism, unspecified: Secondary | ICD-10-CM | POA: Diagnosis not present

## 2015-07-29 DIAGNOSIS — E109 Type 1 diabetes mellitus without complications: Secondary | ICD-10-CM | POA: Diagnosis not present

## 2015-07-29 DIAGNOSIS — Z8669 Personal history of other diseases of the nervous system and sense organs: Secondary | ICD-10-CM | POA: Insufficient documentation

## 2015-07-29 DIAGNOSIS — E785 Hyperlipidemia, unspecified: Secondary | ICD-10-CM | POA: Insufficient documentation

## 2015-07-29 DIAGNOSIS — Z853 Personal history of malignant neoplasm of breast: Secondary | ICD-10-CM | POA: Insufficient documentation

## 2015-07-29 DIAGNOSIS — Z794 Long term (current) use of insulin: Secondary | ICD-10-CM | POA: Insufficient documentation

## 2015-07-29 DIAGNOSIS — N189 Chronic kidney disease, unspecified: Secondary | ICD-10-CM | POA: Diagnosis not present

## 2015-07-29 DIAGNOSIS — Z7982 Long term (current) use of aspirin: Secondary | ICD-10-CM | POA: Diagnosis not present

## 2015-07-29 DIAGNOSIS — I509 Heart failure, unspecified: Secondary | ICD-10-CM | POA: Diagnosis not present

## 2015-07-29 DIAGNOSIS — I252 Old myocardial infarction: Secondary | ICD-10-CM | POA: Insufficient documentation

## 2015-07-29 DIAGNOSIS — R0602 Shortness of breath: Secondary | ICD-10-CM

## 2015-07-29 LAB — CBC WITH DIFFERENTIAL/PLATELET
BASOS ABS: 0.1 10*3/uL (ref 0.0–0.1)
Basophils Relative: 1 % (ref 0–1)
Eosinophils Absolute: 0.3 10*3/uL (ref 0.0–0.7)
Eosinophils Relative: 5 % (ref 0–5)
HEMATOCRIT: 40 % (ref 36.0–46.0)
Hemoglobin: 13.7 g/dL (ref 12.0–15.0)
LYMPHS ABS: 0.9 10*3/uL (ref 0.7–4.0)
LYMPHS PCT: 15 % (ref 12–46)
MCH: 30.9 pg (ref 26.0–34.0)
MCHC: 34.3 g/dL (ref 30.0–36.0)
MCV: 90.3 fL (ref 78.0–100.0)
MPV: 9.3 fL (ref 8.6–12.4)
Monocytes Absolute: 0.9 10*3/uL (ref 0.1–1.0)
Monocytes Relative: 16 % — ABNORMAL HIGH (ref 3–12)
NEUTROS PCT: 63 % (ref 43–77)
Neutro Abs: 3.7 10*3/uL (ref 1.7–7.7)
Platelets: 218 10*3/uL (ref 150–400)
RBC: 4.43 MIL/uL (ref 3.87–5.11)
RDW: 14.8 % (ref 11.5–15.5)
WBC: 5.9 10*3/uL (ref 4.0–10.5)

## 2015-07-29 LAB — BASIC METABOLIC PANEL
BUN: 44 mg/dL — AB (ref 7–25)
CHLORIDE: 101 mmol/L (ref 98–110)
CO2: 24 mmol/L (ref 20–31)
Calcium: 9.4 mg/dL (ref 8.6–10.4)
Creat: 1.77 mg/dL — ABNORMAL HIGH (ref 0.60–0.93)
Glucose, Bld: 108 mg/dL — ABNORMAL HIGH (ref 65–99)
Potassium: 4.4 mmol/L (ref 3.5–5.3)
Sodium: 137 mmol/L (ref 135–146)

## 2015-07-29 LAB — D-DIMER, QUANTITATIVE: D-Dimer, Quant: 1.23 ug/mL-FEU — ABNORMAL HIGH (ref 0.00–0.48)

## 2015-07-29 LAB — BRAIN NATRIURETIC PEPTIDE: Brain Natriuretic Peptide: 885.5 pg/mL — ABNORMAL HIGH (ref 0.0–100.0)

## 2015-07-29 LAB — CBG MONITORING, ED: GLUCOSE-CAPILLARY: 262 mg/dL — AB (ref 65–99)

## 2015-07-29 MED ORDER — METOPROLOL SUCCINATE ER 25 MG PO TB24: 12.5000 mg | ORAL_TABLET | Freq: Every day | ORAL | Status: DC

## 2015-07-29 NOTE — Telephone Encounter (Signed)
Prescription faxed

## 2015-07-29 NOTE — Patient Instructions (Addendum)
Medication Instructions:  Your physician has recommended you make the following change in your medication:  1.  DECREASE the Metoprolol to 25 mg taking 1/2 tablet daily.  Worthy Keeler: TODAY:  BMET                CBC W/DIFF                BNP                DDIMER  Testing/Procedures: Your physician has requested that you have an echocardiogram. Echocardiography is a painless test that uses sound waves to create images of your heart. It provides your doctor with information about the size and shape of your heart and how well your heart's chambers and valves are working. This procedure takes approximately one hour. There are no restrictions for this procedure.    Follow-Up: Your physician recommends that you schedule a follow-up appointment in: 1-2 WEEKS WITH DR. ROSS (IF AT ALL POSSIBLE) IF NOT, 1ST AVAILABLE EXTENDER

## 2015-07-29 NOTE — ED Notes (Signed)
Patient here with complaint of shortness of breath. States ongoing for 4 years, but worsened this past weekend. Saw cardiologist today. Was called and reported to that she has an elevated D-Dimer @ 1.23. Presents for PE rule out.

## 2015-07-29 NOTE — Telephone Encounter (Signed)
-----   Message from Consuelo Pandy, [place] sent at 07/29/2015  4:52 PM EDT ----- D-dimer positive at 1.23. Advise patient to go to ER for CT of chest to rule out blood clot in her lungs. BNP suggest mild fluid overload. Her renal function is stable and at her baseline. Potassium is ok. Continue current lasix dosing.

## 2015-07-29 NOTE — Telephone Encounter (Signed)
New message      Please fax presc for breast prosthesis to [phone] [facility].

## 2015-07-29 NOTE — Progress Notes (Signed)
07/29/2015 [name]   1946-06-13  [id number]  Primary Physician [name] Costa, MD Primary Cardiologist: Dr. Harrington [name]   Reason for Visit/CC: Dyspnea on Exertion  HPI:  The patient is a 69 year old female, followed by Dr. Harrington [name], with a history of CAD (s/p CABG- cath in 09/2011 showed patent LIMA to LAD with occlusion of distal LAD; High grade stenosis of nondominant RCA; Signifcant disease of prox LCx; SVG to L PDA occluded, SVG to OM patent), Aortic stenosis, DM, dyslipidemia, PVD and CKD, Echo 05/2013: Mild LVH, EF 30%, RVEF depressed; mod aortic stenosis.  She presents to clinic today with a complaint of recent dyspnea on exertion. She also endorses rencent 2 pillow orthopnea and 5 weight gain over the last several weeks. She called the office yesterday and was advised to take an additional 40 mg of Lasix and to follow-up in our office today for further evaluation.   [facility]. She tells me that she recently traveled out of town to [place] to visit a friend. It was during that time when she noticed increased dyspnea with exertion. She denies any resting dyspnea. She reports that she has been fully compliant with her medications. She does admit that she did eat out while on vacation. She ate at a seafood restaurant and had fried crab cakes and fried shrimp. Otherwise, she has avoided other high sodium foods. She denies any chest pain. She has had some dizziness/ near syncope but denies frank syncope. Also no palpitations. She traveled by car to her destination. It was a 4 hr drive one way. She denies any significant LEE. No leg pain. Her O2 sats on RA is 94%. EKG shows sinus brady with a rate of 53 bpm with rightward axis and pulmonary disease pattern. However no S1Q3T3. She also notes recent development of a dry nonproductive cough x 4 weeks, but no fever or chills.   She has noticed some improvement after taking an extra dose of lasix yesterday.     Current Outpatient Prescriptions  Medication Sig Dispense Refill  . aspirin EC 325 MG EC tablet Take 325 mg by mouth daily.     [name] atorvastatin (LIPITOR) 40 MG tablet TAKE 1 TABLET (40 MG TOTAL) BY MOUTH AT BEDTIME. 30 tablet 0  . cyanocobalamin (,VITAMIN B-12,) 1000 MCG/ML injection Inject 1 mL (1,000 mcg total) into the muscle once. (Patient taking differently: Inject 1,000 mcg into the muscle every 8 (eight) weeks. ) 1 mL 0  . furosemide (LASIX) 20 MG tablet TAKE 3 TABLETS BY MOUTH EVERY DAY ADD 2 MORE TABLETS EVERY 3RD DAY 110 tablet 1  . HUMALOG 100 UNIT/ML injection PLEASE INJECT SUBCUTANEOUSLY BEFORE MEALS ACCORDING TO SLIDING SCALE  11  . isosorbide mononitrate (IMDUR) 30 MG 24 hr tablet TAKE 1 TABLET BY MOUTH EVERY DAY (CALL TO SCHEDULE APPT!!) 90 tablet 1  . LEVEMIR 100 UNIT/ML injection Inject into the skin. 10-22 units injected 2 times daily    . levothyroxine (SYNTHROID, LEVOTHROID) 100 MCG tablet Take 100 mcg by mouth daily.  5  . metoprolol succinate (TOPROL-XL) 25 MG 24 hr tablet TAKE 1 TABLET (25 MG TOTAL) BY MOUTH DAILY. 30 tablet 3  . ONETOUCH VERIO test strip USE AS DIRECTED 3 TIMES A DAY  6   No current facility-administered medications for this visit.    Allergies  Allergen Reactions  . Adhesive [Tape] Other (See Comments)    "old [employer] adhesive tape; takes my skin off; can use paper  tape"  . Zyvox [Linezolid]     Thrombocytopenia developed to 50k after several weeks of therapy  . Bactrim [Sulfamethoxazole-Trimethoprim]     Hyperkalemia and Increased creatinine  . Cephalexin Swelling and Rash  . Ciprofloxacin Swelling and Rash  . Daptomycin     Had elevated CPK on therapy, not clear that this was due to cubicin  . Vancomycin     ? If this played role in her Acute kidney injury    Social History   Social History  . Marital Status: Single    Spouse Name: N/A  . Number of Children: N/A  . Years of Education: N/A   Occupational History  .  Web designer    Social History Main Topics  . Smoking status: Never Smoker   . Smokeless tobacco: Never Used  . Alcohol Use: No  . Drug Use: No  . Sexual Activity: Not Currently    Birth Control/ Protection: Post-menopausal   Other Topics Concern  . Not on file   Social History Narrative     Review of Systems: General: negative for chills, fever, night sweats or weight changes.  Cardiovascular: negative for chest pain, + dyspnea on exertion, edema, + orthopnea, negative palpitations, paroxysmal nocturnal dyspnea or shortness of breath Dermatological: negative for rash Respiratory: + for cough, - wheezing Urologic: negative for hematuria Abdominal: negative for nausea, vomiting, diarrhea, bright red blood per rectum, melena, or hematemesis Neurologic: negative for visual changes, syncope, or dizziness All other systems reviewed and are otherwise negative except as noted above.    Blood pressure 130/70, pulse 60, height 5' 3.5" (1.613 m), weight 170 lb 12.8 oz (77.474 kg), SpO2 94 %.  General appearance: alert, cooperative and no distress Neck: no carotid bruit and no JVD Lungs: clear to auscultation bilaterally Heart: regular rate and rhythm and 2/6 SM along RUSB Extremities: 1+ bilateral LEE Pulses: 2+ and symmetric Skin: warm and dry Neurologic: Grossly normal  EKG sinus bradycardia. 53 bpm. Right axis deviation.  ASSESSMENT AND PLAN:   1. DOE: Differential diagnosis includes acute on chronic systolic CHF, symptomatic bradycardia, progressive aortic stenosis, PE, angina and anemia. Will obtain a BNP, 2D echo, d-dimer and CBC. Will decrease metoprolol to 12.5 mg BID.   2. Acute on Chronic Systolic CHF: She continues to have 1+ bilateral lower extremity edema on physical exam and continues to have mild dyspnea on exertion, although improved after additional dose of Lasix yesterday. This is in the setting of dietary indiscretion during a recent vacation. We will  check a BNP as well as a BMP today. She will continue her current Lasix dosage of 60 mg daily/ 80 mg every other day. She's been instructed to avoid sodium. Her last 2-D echocardiogram was in 2014. Will order a 2D echo to reevaluate LV function and wall motion.  3. Bradycardia: HR is 53 bpm. ? If DOE and dizziness is due to bradycardia. Will decrease her metoprolol to 12.5 mg daily.  4. HTN: BP is well controlled.   5. Aortic Stenosis: moderate at time of last echo in 2014. Will reassess with repeat 2D echo.   6. CAD: h/o CABG but denies any recent chest pain. Will check a 2D echo. If any significant decrease in LV systolic function or if any new WMA, will need to consider ischemic testing. Otherwise continue current medical regimen.    PLAN  We will f/u results of d-dimer. If +, will need a chest CT to r/o PE. Will also follow additional  lab work and will advise on any additional diuretic dose adjustments based on renal function. F/u with Dr. Harrington [name] or APP in 1-2 weeks.   Lyda Jester PA-C 07/29/2015 11:43 AM

## 2015-07-29 NOTE — ED Notes (Signed)
VQ scan called sts will take approximately 2 hours before they can take her. Pt updated.

## 2015-07-29 NOTE — ED Notes (Signed)
Pt refusing to have any IV started by ED nurses, demanding IV team to start IV. MD to be notified.

## 2015-07-29 NOTE — ED Notes (Signed)
Called x1 no answer in lobby

## 2015-07-29 NOTE — Telephone Encounter (Signed)
Spoke to pt ad advised her of her lab results and per Ellen Henri, PA-C, pt needs to go to the ED for chest ct to r/o p/e since the DDimer was +.  PT verbalilzed understanding.

## 2015-07-30 DIAGNOSIS — R0602 Shortness of breath: Secondary | ICD-10-CM | POA: Diagnosis not present

## 2015-07-30 MED ORDER — TECHNETIUM TO 99M ALBUMIN AGGREGATED
4.0600 | Freq: Once | INTRAVENOUS | Status: AC | PRN
  Administered 2015-07-30: 4 via INTRAVENOUS

## 2015-07-30 MED ORDER — TECHNETIUM TC 99M DIETHYLENETRIAME-PENTAACETIC ACID: 31.3000 | Freq: Once | INTRAVENOUS | Status: DC | PRN

## 2015-07-30 NOTE — ED Notes (Signed)
Pt transported to VQ scan. 

## 2015-07-30 NOTE — Discharge Instructions (Signed)
Heart Failure  Heart failure means your heart has trouble pumping blood. This makes it hard for your body to work well. Heart failure is usually a long-term (chronic) condition. You must take good care of yourself and follow your doctor's treatment plan.  HOME CARE   Take your heart medicine as told by your doctor.    Do not stop taking medicine unless your doctor tells you to.    Do not skip any dose of medicine.    Refill your medicines before they run out.    Take other medicines only as told by your doctor or pharmacist.   Stay active if told by your doctor. The elderly and people with severe heart failure should talk with a doctor about physical activity.   Eat heart-healthy foods. Choose foods that are without trans fat and are low in saturated fat, cholesterol, and salt (sodium). This includes fresh or frozen fruits and vegetables, fish, lean meats, fat-free or low-fat dairy foods, whole grains, and high-fiber foods. Lentils and dried peas and beans (legumes) are also good choices.   Limit salt if told by your doctor.   Cook in a healthy way. Roast, grill, broil, bake, poach, steam, or stir-fry foods.   Limit fluids as told by your doctor.   Weigh yourself every morning. Do this after you pee (urinate) and before you eat breakfast. Write down your weight to give to your doctor.   Take your blood pressure and write it down if your doctor tells you to.   Ask your doctor how to check your pulse. Check your pulse as told.   Lose weight if told by your doctor.   Stop smoking or chewing tobacco. Do not use gum or patches that help you quit without your doctor's approval.   Schedule and go to doctor visits as told.   Nonpregnant women should have no more than 1 drink a day. Men should have no more than 2 drinks a day. Talk to your doctor about drinking alcohol.   Stop illegal drug use.   Stay current with shots (immunizations).   Manage your health conditions as told by your doctor.   Learn to  manage your stress.   Rest when you are tired.   If it is really hot outside:    Avoid intense activities.    Use air conditioning or fans, or get in a cooler place.    Avoid caffeine and alcohol.    Wear loose-fitting, lightweight, and light-colored clothing.   If it is really cold outside:    Avoid intense activities.    Layer your clothing.    Wear mittens or gloves, a hat, and a scarf when going outside.    Avoid alcohol.   Learn about heart failure and get support as needed.   Get help to maintain or improve your quality of life and your ability to care for yourself as needed.  GET HELP IF:    You gain weight quickly.   You are more short of breath than usual.   You cannot do your normal activities.   You tire easily.   You cough more than normal, especially with activity.   You have any or more puffiness (swelling) in areas such as your hands, feet, ankles, or belly (abdomen).   You cannot sleep because it is hard to breathe.   You feel like your heart is beating fast (palpitations).   You get dizzy or light-headed when you stand up.  GET HELP   are not doing well or get worse.   This information is not intended to replace advice given to you by your health care provider. Make sure you discuss any questions you have with your health care provider.   Follow up with your cardiologist on Monday for adjustment of diuretics and for follow up evaluation of heart failure. Return to the Emergency department if you experience worsening of your symptoms, chest pain, increased SOB, loss of consciousness, dizziness.

## 2015-07-31 NOTE — ED Provider Notes (Signed)
CSN: [id number]     Arrival date & time 07/29/15  1841 History   First MD Initiated Contact with Patient 07/29/15 2057     Chief Complaint  Patient presents with  . Shortness of Breath  . Abnormal Lab     (Consider location/radiation/quality/duration/timing/severity/associated sxs/prior Treatment) HPI  [name] is a 69 y.o F with a pmhx of CAD, s/p CABG, Aortic stenosis, DM, dyslipidemia, PVD and CKD,CHFwith an EF 30% who presents to the ED today from her cardiology office for an elevated D-dimer. Cardiology would like to r/o PE. Pt states that she has been having recent increased dyspnea on exertion. She also endorses rencent 2 pillow orthopnea and 5 weight gain over the last several weeks.Her cardiologist advised her to take an additional 40 mg of Lasix which she has done with no relief. Pt states that she recently traveled out of town to [place] to visit a friend, which is when she began to feel increased DOE. Her SOB is relieved with rest. She reports that she has been fully compliant with her medications. Denies chest pain, palpitations, increased lower extremity edema. She has had some dizziness/ near syncope but denies frank syncope. Pt is not hypoxic or tachycardic.   Past Medical History  Diagnosis Date  . HTN (hypertension)   . Hypothyroidism   . History of recurrent UTIs   . CAD (coronary artery disease)     s/p CABG x3 in 1993; last cath in 2010  . Scleroderma ([place])   . Bilateral carpal tunnel syndrome 1970s  . PVD (peripheral vascular disease) ([place])     Toes amputated from left foot and has had prior bypass on left leg. Followed by Dr. Donnetta Hutching  . Claudication ([place])   . Aortic stenosis     0.8cm2 on echo in 10/2011  . Hyperlipidemia     on Lipitor  . Cat bite 2009    with MRSA; required I & D  . Heart murmur   . Blood transfusion     no reaction from transfusion; "when I had heart surgery" (08/28/2012)  . GERD (gastroesophageal reflux disease)    . CHF (congestive heart failure) ([place]) Aug. 2012    Echo 10/2011: Mild LVH, EF 30%, apex akinetic, septal HK, grade 2 diastolic dysfunction, or functionally bicuspid aortic valve, mild aortic stenosis by gradient, severe by calculated AVA-suspect A. Aortic stenosis probably moderate, trivial MR, mild LAE, mild RAE.   [name] Cellulitis   . Anemia   . B12 deficiency anemia     B 12 injection "monthly since 05/2012" (08/28/2012)  . Iron deficiency anemia 06/13/2012    "iron infusion" (08/28/2012  . Anginal pain ([place])     "history; not currently" (08/28/2012)  . Myocardial infarction [facility]) 1986    "silent"  . History of bronchitis     "I've had it twice in my life" (08/28/2012)  . Exertional dyspnea     "because of the heart failure" (08/28/2012)  . Type 1 diabetes ([place]) 10/1949  . Stomach ulcer 1981?    "on Tagament for ~ 1 yr" (08/28/2012)  . Seizure ([place])     ? seizure like event in 2010; workup included stress testing which led to repeat cath.   . Arthritis     "hands, hips, all over" (08/28/2012  . Osteoarthritis of both feet   . Breast cancer ([place])     left  . Cellulitis June 2013, Nov. 2013 and Feb. 14, 2014    Left leg  Past Surgical History  Procedure Laterality Date  . Carpal tunnel release  ~ 1970's    bilaterally  . Mastectomy  11/1982    Left Breast  . Vitrectomy  1990's-2004    "both eyes; I've had total of 4"  . Tubal ligation  1984  . Tonsillectomy  1952    "?adenoids"   . Appendectomy  1991  . Femoral bypass      left leg "related to transmetatarsal amputation" (08/28/2012)  . Breast biopsy with sentinel lymph node biopsy and needle localization  1984  . Coronary artery bypass graft  1992    LIMA to LAD, SVG to distal LCX & SVG to Marginal  . Cardiac catheterization  2010    LIMA to LAD patent yet with occluded distal LAD after graft insertion & retrograde is occluded. 3-4 septal perforators arise &are patent. SVG to a large marginal patent; SVG presumably to the  distal dominant LCX is occluded, moderately high grade stenosis of the AV circumflex, but with distal stenoses involving a severely diseased marginal branch not amenable  to PCI  nor is inferior branch   . Cardiac catheterization  10/2011    "unsuccessful attempt of stenting" (08/28/2012)  . Cardiac catheterization  1992; 09/2011  . Breast biopsy    . Trigger finger release  1980's    right thumb  . Incision and drainage of wound  ~ 1967    "infection in left foot" (08/28/2012)  . Incision and drainage of wound  2009    "3 ORs on my right hand after cat bite" (08/28/2012)  . 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 Cataract extraction w/ intraocular lens  implant, bilateral  1990's  . Amputation Right 05/27/2013    Procedure: AMPUTATION DIGIT FIFTH TOE;  Surgeon: Rosetta Posner, MD;  Location: [facility];  Service: Vascular;  Laterality: Right;  . Left and right heart catheterization with coronary angiogram N/A 09/17/2011    Procedure: LEFT AND RIGHT HEART CATHETERIZATION WITH CORONARY ANGIOGRAM;  Surgeon: Hillary Bow, MD;  Location: [facility] CATH LAB;  Service: Cardiovascular;  Laterality: N/A;  . Percutaneous coronary stent intervention (pci-s) N/A 11/15/2011    Procedure: PERCUTANEOUS CORONARY STENT INTERVENTION (PCI-S);  Surgeon: Hillary Bow, MD;  Location: [facility] CATH LAB;  Service: Cardiovascular;  Laterality: N/A;  . Lower extremity angiogram N/A 05/25/2013    Procedure: LOWER EXTREMITY ANGIOGRAM POSS INTERVENTION;  Surgeon: Conrad Franklin Park, MD;  Location: [facility] CATH LAB;  Service: Cardiovascular;  Laterality: N/A;   Family History  Problem Relation Age of Onset  . Diabetes Mother   . Heart attack Neg Hx   . Stroke Mother   . Stroke Father   . Hypertension Mother    Social History  Substance Use Topics  . Smoking status: Never Smoker   . Smokeless tobacco: Never Used  . Alcohol Use: No   OB History    No data available     Review of Systems  All other  systems reviewed and are negative.     Allergies  Adhesive; Zyvox; Bactrim; Cephalexin; Ciprofloxacin; Daptomycin; and Vancomycin  Home Medications   Prior to Admission medications   Medication Sig Start Date End Date Taking? Authorizing Provider  aspirin EC 325 MG EC tablet Take 325 mg by mouth daily.    Yes Historical Provider, MD  atorvastatin (LIPITOR) 40 MG tablet TAKE 1 TABLET (40 MG TOTAL) BY MOUTH AT BEDTIME.   Yes Nevin Bloodgood  Alcus Dad, MD  cyanocobalamin (,VITAMIN B-12,) 1000 MCG/ML injection Inject 1 mL (1,000 mcg total) into the muscle once. Patient taking differently: Inject 1,000 mcg into the muscle every 8 (eight) weeks.  07/03/13  Yes Ivor Costa, MD  furosemide (LASIX) 20 MG tablet TAKE 3 TABLETS BY MOUTH EVERY DAY ADD 2 MORE TABLETS EVERY 3RD DAY 06/08/15  Yes Fay Records, MD  insulin detemir (LEVEMIR) 100 UNIT/ML injection Inject 10-22 Units into the skin 2 (two) times daily. Inject 22 units subutaneously every morning (10am)  and 10 units at bedtime (10pm)   Yes Historical Provider, MD  insulin lispro (HUMALOG) 100 UNIT/ML injection Inject 1-12 Units into the skin 4 (four) times daily as needed for high blood sugar (CBG >120 when eating or CBG>140 but not eating).   Yes Historical Provider, MD  isosorbide mononitrate (IMDUR) 30 MG 24 hr tablet TAKE 1 TABLET BY MOUTH EVERY DAY (CALL TO SCHEDULE APPT!!) Patient taking differently: TAKE 1 TABLET BY MOUTH EVERY DAY AT BEDTIME (CALL TO SCHEDULE APPT!!) 01/11/15  Yes Fay Records, MD  levothyroxine (SYNTHROID, LEVOTHROID) 100 MCG tablet Take 100 mcg by mouth daily before breakfast.  05/19/15  Yes Historical Provider, MD  metoprolol succinate (TOPROL-XL) 25 MG 24 hr tablet Take 0.5 tablets (12.5 mg total) by mouth daily. 07/29/15  Yes Brittainy Erie Noe, PA-C  ONETOUCH VERIO test strip USE AS DIRECTED 3 TIMES A DAY 06/17/15   Historical Provider, MD   BP 146/60 mmHg  Pulse 49  Temp(Src) 98.3 F (36.8 C) (Oral)  Resp 16  Ht 5' 3.5" (1.613  m)  Wt 170 lb 11.2 oz (77.429 kg)  BMI 29.76 kg/m2  SpO2 92% Physical Exam  Constitutional: She is oriented to person, place, and time. She appears well-developed and well-nourished. No distress.  HENT:  Head: Normocephalic and atraumatic.  Mouth/Throat: No oropharyngeal exudate.  Eyes: Conjunctivae and EOM are normal. Pupils are equal, round, and reactive to light. Right eye exhibits no discharge. Left eye exhibits no discharge. No scleral icterus.  Cardiovascular: Normal rate, regular rhythm, normal heart sounds and intact distal pulses.  Exam reveals no gallop and no friction rub.   No murmur heard. Pulmonary/Chest: Effort normal and breath sounds normal. No respiratory distress. She has no wheezes. She has no rales. She exhibits no tenderness.  Abdominal: Soft. She exhibits no distension. There is no tenderness. There is no guarding.  Musculoskeletal: Normal range of motion. She exhibits no edema.  Neurological: She is alert and oriented to person, place, and time. No cranial nerve deficit.  Strength 5/5 throughout. No sensory deficits.    Skin: Skin is warm and dry. No rash noted. She is not diaphoretic. No erythema. No pallor.  Psychiatric: She has a normal mood and affect. Her behavior is normal.  Nursing note and vitals reviewed.   ED Course  Procedures (including critical care time) Labs Review Labs Reviewed  CBG MONITORING, ED - Abnormal; Notable for the following:    Glucose-Capillary 262 (*)    All other components within normal limits    Imaging Review Dg Chest 2 View  07/29/2015  CLINICAL DATA:  Sob x 1 week, hx breast ca EXAM: CHEST  2 VIEW COMPARISON:  05/23/2013 FINDINGS: Prior median sternotomy. Midline trachea. Mild cardiomegaly. A small right pleural effusion is new. No pneumothorax. Pulmonary interstitial prominence and indistinctness with septal thickening. Mild right base airspace disease. IMPRESSION: Mild congestive heart failure with small right pleural  effusion. Right base airspace disease, favoring  atelectasis. Concurrent infection cannot be excluded. Electronically Signed   By: Abigail Miyamoto M.D.   On: 07/29/2015 20:55   Nm Pulmonary Perf And Vent  07/30/2015  CLINICAL DATA:  Patient was admitted to hospital on Friday 07/29/2015 after developing shortness of breath. Recent trip to [place]. History of CHF. Cellulitis in both legs. D-dimer 1.23. EXAM: NUCLEAR MEDICINE VENTILATION - PERFUSION LUNG SCAN TECHNIQUE: Ventilation images were obtained in multiple projections using inhaled aerosol Tc-2m DTPA. Perfusion images were obtained in multiple projections after intravenous injection of Tc-48m MAA. RADIOPHARMACEUTICALS:  31.3 mCi Technetium-3m DTPA aerosol inhalation and 4.06 mCi Technetium-20m MAA IV COMPARISON:  Chest radiograph 07/29/2015 FINDINGS: Ventilation: Normal homogeneous distribution of tracer activity throughout both lungs. No focal defects demonstrated. Perfusion: Normal homogeneous distribution of tracer activity throughout both lungs. No focal defects demonstrated. IMPRESSION: Very low probability for pulmonary embolus. Electronically Signed   By: Lucienne Capers M.D.   On: 07/30/2015 01:36   I have personally reviewed and evaluated these images and lab results as part of my medical decision-making.   EKG Interpretation   Date/Time:  Friday July 29 2015 19:54:51 EDT Ventricular Rate:  51 PR Interval:  182 QRS Duration: 74 QT Interval:  474 QTC Calculation: 436 R Axis:   90 Text Interpretation:  Sinus bradycardia with ventricular escape complexes  Rightward axis Low voltage QRS Septal infarct , age undetermined T wave  abnormality, consider inferolateral ischemia Abnormal ECG No significant  change was found Confirmed by CAMPOS  MD, Lennette Bihari ([id number]) on 07/30/2015  1:51:32 AM      MDM   Final diagnoses:  PE (pulmonary embolism)  Acute on chronic systolic heart failure (HCC)    *PT DOES NOT HAVE PE. ABOVE CLINICAL  IMPRESSION IS INACCURATE  Pt with significant cardiac history presents to the ED from her cardiologist to r/o PE for elevated D-dimer. Increased DOE over last several weeks. No SOB rest. No increased in LE edema. Pt not hypoxic or tachycardic. No chest pain.   Pt unable to have CT angio due to creatinine. VQ scan ordered. Negative for PE. BNP elevated >800. Suspect this is acute on chronic CHF. Pt will take 100 home lasix today and again on Saturday.  Pt will follow up with cardiologist on Monday for medication adjustment.   Pt ambulated with pulse ox >95%.   Pt given strict return precautions. Patient was discussed with and seen by Dr. Venora Maples who agrees with the treatment plan.    Dondra Spry [place], PA-C 07/31/15 [place], MD 08/01/15 [phone]

## 2015-08-01 ENCOUNTER — Telehealth: Payer: Self-pay | Admitting: Internal Medicine

## 2015-08-01 DIAGNOSIS — R600 Localized edema: Secondary | ICD-10-CM

## 2015-08-01 DIAGNOSIS — R0602 Shortness of breath: Secondary | ICD-10-CM

## 2015-08-01 NOTE — Telephone Encounter (Signed)
New message     Talk to a nurse.  Pt was seen in the ER over the weekend.  Please look at ER records and call pt.  She has questions

## 2015-08-01 NOTE — Telephone Encounter (Signed)
Patient saw PA (FLEX) on 10/14, later was instructed to go to ED for CT scan based on elevated D Dimer.  At ED VQ scan was done.  Negative for PE.  Instructed to increase lasix for the weekend and f/u on Monday with cardiology.  Pts usual dose of lasix is 60 mg daily, except every third day take 100 mg.  Sat and Sun pt took 120 mg of Lasix. Today (Mon) she took 60 mg.  She has lost 2 lbs since Friday. She is still very SOB w/ little exertion. Using walker around her house. Sleeping better, using only 1 pillow. BP 146/65, no dizziness or lightheadedness.  Has echo scheduled for 10/21 No repeat labs ordered.  Appt w/ Ermalinda Barrios PA on 08/16/15.  Pt is aware I will review with MD and call her tomorrow with any new recommendations.

## 2015-08-04 ENCOUNTER — Other Ambulatory Visit: Payer: Self-pay | Admitting: Internal Medicine

## 2015-08-04 ENCOUNTER — Ambulatory Visit (HOSPITAL_BASED_OUTPATIENT_CLINIC_OR_DEPARTMENT_OTHER): Payer: Medicare Other

## 2015-08-04 VITALS — BP 104/41 | HR 56 | Temp 98.2°F

## 2015-08-04 DIAGNOSIS — D518 Other vitamin B12 deficiency anemias: Secondary | ICD-10-CM

## 2015-08-04 MED ORDER — CYANOCOBALAMIN 1000 MCG/ML IJ SOLN
1000.0000 ug | Freq: Once | INTRAMUSCULAR | Status: AC
  Administered 2015-08-04: 1000 ug via INTRAMUSCULAR

## 2015-08-04 NOTE — Telephone Encounter (Signed)
Please set pt up for BMET and BNP when she comes back for echo

## 2015-08-05 ENCOUNTER — Ambulatory Visit (HOSPITAL_COMMUNITY): Payer: Medicare Other | Attending: Cardiovascular Disease

## 2015-08-05 ENCOUNTER — Other Ambulatory Visit: Payer: Self-pay

## 2015-08-05 ENCOUNTER — Other Ambulatory Visit (INDEPENDENT_AMBULATORY_CARE_PROVIDER_SITE_OTHER): Payer: Medicare Other | Admitting: *Deleted

## 2015-08-05 DIAGNOSIS — E785 Hyperlipidemia, unspecified: Secondary | ICD-10-CM | POA: Insufficient documentation

## 2015-08-05 DIAGNOSIS — I352 Nonrheumatic aortic (valve) stenosis with insufficiency: Secondary | ICD-10-CM | POA: Diagnosis not present

## 2015-08-05 DIAGNOSIS — I10 Essential (primary) hypertension: Secondary | ICD-10-CM | POA: Diagnosis not present

## 2015-08-05 DIAGNOSIS — R0602 Shortness of breath: Secondary | ICD-10-CM

## 2015-08-05 DIAGNOSIS — E538 Deficiency of other specified B group vitamins: Secondary | ICD-10-CM | POA: Diagnosis not present

## 2015-08-05 DIAGNOSIS — I5042 Chronic combined systolic (congestive) and diastolic (congestive) heart failure: Secondary | ICD-10-CM | POA: Diagnosis not present

## 2015-08-05 DIAGNOSIS — I059 Rheumatic mitral valve disease, unspecified: Secondary | ICD-10-CM | POA: Diagnosis not present

## 2015-08-05 DIAGNOSIS — E119 Type 2 diabetes mellitus without complications: Secondary | ICD-10-CM | POA: Diagnosis not present

## 2015-08-05 DIAGNOSIS — R600 Localized edema: Secondary | ICD-10-CM | POA: Diagnosis not present

## 2015-08-05 DIAGNOSIS — I5032 Chronic diastolic (congestive) heart failure: Secondary | ICD-10-CM | POA: Diagnosis not present

## 2015-08-05 DIAGNOSIS — R609 Edema, unspecified: Secondary | ICD-10-CM | POA: Diagnosis not present

## 2015-08-05 DIAGNOSIS — I517 Cardiomegaly: Secondary | ICD-10-CM | POA: Insufficient documentation

## 2015-08-05 LAB — BASIC METABOLIC PANEL
BUN: 38 mg/dL — ABNORMAL HIGH (ref 7–25)
CALCIUM: 9.1 mg/dL (ref 8.6–10.4)
CHLORIDE: 99 mmol/L (ref 98–110)
CO2: 26 mmol/L (ref 20–31)
CREATININE: 1.58 mg/dL — AB (ref 0.60–0.93)
Glucose, Bld: 182 mg/dL — ABNORMAL HIGH (ref 65–99)
POTASSIUM: 4.5 mmol/L (ref 3.5–5.3)
SODIUM: 136 mmol/L (ref 135–146)

## 2015-08-05 NOTE — Telephone Encounter (Signed)
Orders placed; appointment made for this afternoon. Echo is at 3pm. Patient is aware.

## 2015-08-06 LAB — BRAIN NATRIURETIC PEPTIDE: BRAIN NATRIURETIC PEPTIDE: 1041.5 pg/mL — AB (ref 0.0–100.0)

## 2015-08-09 ENCOUNTER — Other Ambulatory Visit: Payer: Self-pay | Admitting: *Deleted

## 2015-08-09 DIAGNOSIS — I5023 Acute on chronic systolic (congestive) heart failure: Secondary | ICD-10-CM

## 2015-08-09 DIAGNOSIS — R0609 Other forms of dyspnea: Secondary | ICD-10-CM

## 2015-08-09 MED ORDER — FUROSEMIDE 20 MG PO TABS: 60.0000 mg | ORAL_TABLET | ORAL | Status: DC

## 2015-08-16 ENCOUNTER — Ambulatory Visit (INDEPENDENT_AMBULATORY_CARE_PROVIDER_SITE_OTHER): Payer: Medicare Other | Admitting: Physician Assistant

## 2015-08-16 ENCOUNTER — Encounter: Payer: Self-pay | Admitting: Physician Assistant

## 2015-08-16 VITALS — BP 130/60 | HR 63 | Ht 63.5 in | Wt 168.0 lb

## 2015-08-16 DIAGNOSIS — I70203 Unspecified atherosclerosis of native arteries of extremities, bilateral legs: Secondary | ICD-10-CM

## 2015-08-16 DIAGNOSIS — I350 Nonrheumatic aortic (valve) stenosis: Secondary | ICD-10-CM | POA: Diagnosis not present

## 2015-08-16 DIAGNOSIS — I509 Heart failure, unspecified: Secondary | ICD-10-CM | POA: Diagnosis not present

## 2015-08-16 DIAGNOSIS — I10 Essential (primary) hypertension: Secondary | ICD-10-CM | POA: Diagnosis not present

## 2015-08-16 DIAGNOSIS — I5042 Chronic combined systolic (congestive) and diastolic (congestive) heart failure: Secondary | ICD-10-CM

## 2015-08-16 DIAGNOSIS — I5032 Chronic diastolic (congestive) heart failure: Secondary | ICD-10-CM | POA: Diagnosis not present

## 2015-08-16 DIAGNOSIS — N183 Chronic kidney disease, stage 3 unspecified: Secondary | ICD-10-CM

## 2015-08-16 LAB — BASIC METABOLIC PANEL
BUN: 34 mg/dL — AB (ref 7–25)
CO2: 26 mmol/L (ref 20–31)
Calcium: 8.9 mg/dL (ref 8.6–10.4)
Chloride: 100 mmol/L (ref 98–110)
Creat: 1.65 mg/dL — ABNORMAL HIGH (ref 0.60–0.93)
GLUCOSE: 280 mg/dL — AB (ref 65–99)
POTASSIUM: 4.3 mmol/L (ref 3.5–5.3)
SODIUM: 136 mmol/L (ref 135–146)

## 2015-08-16 NOTE — Assessment & Plan Note (Signed)
B/P controlled 

## 2015-08-16 NOTE — Assessment & Plan Note (Addendum)
Creatinine 1.58 08/05/15, was 1.77 the week before. We'll repeat today

## 2015-08-16 NOTE — Assessment & Plan Note (Addendum)
Patient had trouble with fluid overload after vacationing for 6 days. She thinks it was due to the salt intake from eating out. She is doing much better since Lasix increased to 60 mg alternating with 100 mg every other day. We'll continue this dose and check renal function today. Continue 2 g sodium diet. 2D echo showed EF to be 25-30% with grade 2 diastolic dysfunction and moderate aortic stenosis. Could consider changing Lasix to torsemide in the future if she does not respond. Follow-up with Dr. Harrington Challenger in one month.

## 2015-08-16 NOTE — Patient Instructions (Addendum)
Medication Instructions:  Your physician recommends that you continue on your current medications as directed. Please refer to the Current Medication list given to you today. 1- Make sure you continue your current dose of Lasix.  Labwork: Your physician recommends that you have lab work today. BMET  Testing/Procedures: NONE  Follow-Up: Your physician recommends that you schedule a follow-up appointment in: 1 month with Dr. Harrington Challenger.   If you need a refill on your cardiac medications before your next appointment, please call your pharmacy.  Low-Sodium Eating Plan Sodium raises blood pressure and causes water to be held in the body. Getting less sodium from food will help lower your blood pressure, reduce any swelling, and protect your heart, liver, and kidneys. We get sodium by adding salt (sodium chloride) to food. Most of our sodium comes from canned, boxed, and frozen foods. Restaurant foods, fast foods, and pizza are also very high in sodium. Even if you take medicine to lower your blood pressure or to reduce fluid in your body, getting less sodium from your food is important. WHAT IS MY PLAN? Most people should limit their sodium intake to 2,300 mg a day. Your health care provider recommends that you limit your sodium intake to ____2 grams______ a day.  WHAT DO I NEED TO KNOW ABOUT THIS EATING PLAN? For the low-sodium eating plan, you will follow these general guidelines:  Choose foods with a % Daily Value for sodium of less than 5% (as listed on the food label).   Use salt-free seasonings or herbs instead of table salt or sea salt.   Check with your health care provider or pharmacist before using salt substitutes.   Eat fresh foods.  Eat more vegetables and fruits.  Limit canned vegetables. If you do use them, rinse them well to decrease the sodium.   Limit cheese to 1 oz (28 g) per day.   Eat lower-sodium products, often labeled as "lower sodium" or "no salt  added."  Avoid foods that contain monosodium glutamate (MSG). MSG is sometimes added to [place] food and some canned foods.  Check food labels (Nutrition Facts labels) on foods to learn how much sodium is in one serving.  Eat more home-cooked food and less restaurant, buffet, and fast food.  When eating at a restaurant, ask that your food be prepared with less salt, or no salt if possible.  HOW DO I READ FOOD LABELS FOR SODIUM INFORMATION? The Nutrition Facts label lists the amount of sodium in one serving of the food. If you eat more than one serving, you must multiply the listed amount of sodium by the number of servings. Food labels may also identify foods as:  Sodium free--Less than 5 mg in a serving.  Very low sodium--35 mg or less in a serving.  Low sodium--140 mg or less in a serving.  Light in sodium--50% less sodium in a serving. For example, if a food that usually has 300 mg of sodium is changed to become light in sodium, it will have 150 mg of sodium.  Reduced sodium--25% less sodium in a serving. For example, if a food that usually has 400 mg of sodium is changed to reduced sodium, it will have 300 mg of sodium. WHAT FOODS CAN I EAT? Grains Low-sodium cereals, including oats, puffed wheat and rice, and shredded wheat cereals. Low-sodium crackers. Unsalted rice and pasta. Lower-sodium bread.  Vegetables Frozen or fresh vegetables. Low-sodium or reduced-sodium canned vegetables. Low-sodium or reduced-sodium tomato sauce and paste. Low-sodium or  reduced-sodium tomato and vegetable juices.  Fruits Fresh, frozen, and canned fruit. Fruit juice.  Meat and Other Protein Products Low-sodium canned tuna and salmon. Fresh or frozen meat, poultry, seafood, and fish. Lamb. Unsalted nuts. Dried beans, peas, and lentils without added salt. Unsalted canned beans. Homemade soups without salt. Eggs.  Dairy Milk. Soy milk. Ricotta cheese. Low-sodium or reduced-sodium cheeses.  Yogurt.  Condiments Fresh and dried herbs and spices. Salt-free seasonings. Onion and garlic powders. Low-sodium varieties of mustard and ketchup. Fresh or refrigerated horseradish. Lemon juice.  Fats and Oils Reduced-sodium salad dressings. Unsalted butter.  Other Unsalted popcorn and pretzels.  The items listed above may not be a complete list of recommended foods or beverages. Contact your dietitian for more options. WHAT FOODS ARE NOT RECOMMENDED? Grains Instant hot cereals. Bread stuffing, pancake, and biscuit mixes. Croutons. Seasoned rice or pasta mixes. Noodle soup cups. Boxed or frozen macaroni and cheese. Self-rising flour. Regular salted crackers. Vegetables Regular canned vegetables. Regular canned tomato sauce and paste. Regular tomato and vegetable juices. Frozen vegetables in sauces. Salted [place] fries. Olives. [name]. Relishes. Sauerkraut. Salsa. Meat and Other Protein Products Salted, canned, smoked, spiced, or pickled meats, seafood, or fish. Bacon, ham, sausage, hot dogs, corned beef, chipped beef, and packaged luncheon meats. Salt pork. Jerky. Pickled herring. Anchovies, regular canned tuna, and sardines. Salted nuts. Dairy Processed cheese and cheese spreads. Cheese curds. Blue cheese and cottage cheese. Buttermilk.  Condiments Onion and garlic salt, seasoned salt, table salt, and sea salt. Canned and packaged gravies. Worcestershire sauce. Tartar sauce. Barbecue sauce. Teriyaki sauce. Soy sauce, including reduced sodium. Steak sauce. Fish sauce. Oyster sauce. Cocktail sauce. Horseradish that you find on the shelf. Regular ketchup and mustard. Meat flavorings and tenderizers. Bouillon cubes. Hot sauce. Tabasco sauce. Marinades. Taco seasonings. Relishes. Fats and Oils Regular salad dressings. Salted butter. Margarine. Ghee. Bacon fat.  Other Potato and tortilla chips. Corn chips and puffs. Salted popcorn and pretzels. Canned or dried soups. Pizza. Frozen  entrees and pot pies.  The items listed above may not be a complete list of foods and beverages to avoid. Contact your dietitian for more information.   This information is not intended to replace advice given to you by your health care provider. Make sure you discuss any questions you have with your health care provider.   Document Released: 03/23/2002 Document Revised: 10/22/2014 Document Reviewed: 08/05/2013 Elsevier Interactive Patient Education [employer].

## 2015-08-16 NOTE — Progress Notes (Addendum)
Cardiology Office Note   Date:  08/16/2015   ID:  [name], DOB [birth year], MRN [id number]  PCP:  No PCP Per Patient  Cardiologist:  Dr. Harrington Challenger Chief Complaint: shortness of breath    History of Present Illness: [name] is a 69 y.o. female who presents for follow of acute on chronic systolic and diastolic heart failure. She has a history of CAD (s/p CABG- cath in 09/2011 showed patent LIMA to LAD with occlusion of distal LAD; High grade stenosis of nondominant RCA; Signifcant disease of prox LCx; SVG to L PDA occluded, SVG to OM patent), Aortic stenosis, DM, dyslipidemia, PVD and CKD, Echo 05/2013: Mild LVH, EF 30%, RVEF depressed; mod aortic stenosis.  Was in the office on 07/29/15 with increase dyspnea after traveling for 6 days and eating out. D-dimer was elevated so she was sent to the emergency room for a VQ scan which was negative. Her Lasix was increased and she is feeling better. She says she's lost about 3-4 pounds. She says she doesn't respond to Lasix like she thinks she should and doesn't notice much difference between 60 mg on 100 mg. He has chronic lower extremity edema but overall has improved. No more orthopnea.Repeat 2-D echo was performed on 08/05/15 and showed EF to be 25-30% with severe diffuse hypokinesis with distinct regional wall motion abnormalities. Please see below for details. She also had grade 2 diastolic dysfunction moderate aortic stenosis.    Past Medical History  Diagnosis Date  . HTN (hypertension)   . Hypothyroidism   . History of recurrent UTIs   . CAD (coronary artery disease)     s/p CABG x3 in 1993; last cath in 2010  . Scleroderma ([place])   . Bilateral carpal tunnel syndrome 1970s  . PVD (peripheral vascular disease) ([place])     Toes amputated from left foot and has had prior bypass on left leg. Followed by Dr. Donnetta Hutching  . Claudication ([place])   . Aortic stenosis     0.8cm2 on echo in 10/2011  . Hyperlipidemia     on Lipitor  . Cat  bite 2009    with MRSA; required I & D  . Heart murmur   . Blood transfusion     no reaction from transfusion; "when I had heart surgery" (08/28/2012)  . GERD (gastroesophageal reflux disease)   . CHF (congestive heart failure) ([place]) Aug. 2012    Echo 10/2011: Mild LVH, EF 30%, apex akinetic, septal HK, grade 2 diastolic dysfunction, or functionally bicuspid aortic valve, mild aortic stenosis by gradient, severe by calculated AVA-suspect A. Aortic stenosis probably moderate, trivial MR, mild LAE, mild RAE.   [name] Cellulitis   . Anemia   . B12 deficiency anemia     B 12 injection "monthly since 05/2012" (08/28/2012)  . Iron deficiency anemia 06/13/2012    "iron infusion" (08/28/2012  . Anginal pain ([place])     "history; not currently" (08/28/2012)  . Myocardial infarction [facility]) 1986    "silent"  . History of bronchitis     "I've had it twice in my life" (08/28/2012)  . Exertional dyspnea     "because of the heart failure" (08/28/2012)  . Type 1 diabetes ([place]) 10/1949  . Stomach ulcer 1981?    "on Tagament for ~ 1 yr" (08/28/2012)  . Seizure ([place])     ? seizure like event in 2010; workup included stress testing which led to repeat cath.   . Arthritis     "hands,  hips, all over" (08/28/2012  . Osteoarthritis of both feet   . Breast cancer ([place])     left  . Cellulitis June 2013, Nov. 2013 and Feb. 14, 2014    Left leg    Past Surgical History  Procedure Laterality Date  . Carpal tunnel release  ~ 1970's    bilaterally  . Mastectomy  11/1982    Left Breast  . Vitrectomy  1990's-2004    "both eyes; I've had total of 4"  . Tubal ligation  1984  . Tonsillectomy  1952    "?adenoids"   . Appendectomy  1991  . Femoral bypass      left leg "related to transmetatarsal amputation" (08/28/2012)  . Breast biopsy with sentinel lymph node biopsy and needle localization  1984  . Coronary artery bypass graft  1992    LIMA to LAD, SVG to distal LCX & SVG to Marginal  . Cardiac catheterization   2010    LIMA to LAD patent yet with occluded distal LAD after graft insertion & retrograde is occluded. 3-4 septal perforators arise &are patent. SVG to a large marginal patent; SVG presumably to the distal dominant LCX is occluded, moderately high grade stenosis of the AV circumflex, but with distal stenoses involving a severely diseased marginal branch not amenable  to PCI  nor is inferior branch   . Cardiac catheterization  10/2011    "unsuccessful attempt of stenting" (08/28/2012)  . Cardiac catheterization  1992; 09/2011  . Breast biopsy    . Trigger finger release  1980's    right thumb  . Incision and drainage of wound  ~ 1967    "infection in left foot" (08/28/2012)  . Incision and drainage of wound  2009    "3 ORs on my right hand after cat bite" (08/28/2012)  . 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 Cataract extraction w/ intraocular lens  implant, bilateral  1990's  . Amputation Right 05/27/2013    Procedure: AMPUTATION DIGIT FIFTH TOE;  Surgeon: Rosetta Posner, MD;  Location: [facility] [facility];  Service: Vascular;  Laterality: Right;  . Left and right heart catheterization with coronary angiogram N/A 09/17/2011    Procedure: LEFT AND RIGHT HEART CATHETERIZATION WITH CORONARY ANGIOGRAM;  Surgeon: Hillary Bow, MD;  Location: [facility] CATH LAB;  Service: Cardiovascular;  Laterality: N/A;  . Percutaneous coronary stent intervention (pci-s) N/A 11/15/2011    Procedure: PERCUTANEOUS CORONARY STENT INTERVENTION (PCI-S);  Surgeon: Hillary Bow, MD;  Location: [facility] CATH LAB;  Service: Cardiovascular;  Laterality: N/A;  . Lower extremity angiogram N/A 05/25/2013    Procedure: LOWER EXTREMITY ANGIOGRAM POSS INTERVENTION;  Surgeon: Conrad Wallace Ridge, MD;  Location: [facility] CATH LAB;  Service: Cardiovascular;  Laterality: N/A;     Current Outpatient Prescriptions  Medication Sig Dispense Refill  . aspirin EC 325 MG EC tablet Take 325 mg by mouth daily.     [name] atorvastatin (LIPITOR) 40 MG  tablet TAKE 1 TABLET BY MOUTH AT BEDTIME 30 tablet 11  . cyanocobalamin (,VITAMIN B-12,) 1000 MCG/ML injection Inject 1 mL (1,000 mcg total) into the muscle once. (Patient taking differently: Inject 1,000 mcg into the muscle every 8 (eight) weeks. ) 1 mL 0  . furosemide (LASIX) 20 MG tablet Take 3 tablets (60 mg total) by mouth every other day. Alternate with 100 mg every other day 120 tablet 1  . insulin detemir (LEVEMIR) 100 UNIT/ML injection Inject 10-22 Units into the  skin 2 (two) times daily. Inject 22 units subutaneously every morning (10am)  and 10 units at bedtime (10pm)    . insulin lispro (HUMALOG) 100 UNIT/ML injection Inject 1-12 Units into the skin 4 (four) times daily as needed for high blood sugar (CBG >120 when eating or CBG>140 but not eating).    . isosorbide mononitrate (IMDUR) 30 MG 24 hr tablet TAKE 1 TABLET BY MOUTH EVERY DAY (CALL TO SCHEDULE APPT!!) (Patient taking differently: TAKE 1 TABLET BY MOUTH EVERY DAY AT BEDTIME (CALL TO SCHEDULE APPT!!)) 90 tablet 1  . levothyroxine (SYNTHROID, LEVOTHROID) 100 MCG tablet Take 100 mcg by mouth daily before breakfast.   5  . metoprolol succinate (TOPROL-XL) 25 MG 24 hr tablet Take 0.5 tablets (12.5 mg total) by mouth daily. 30 tablet 3  . ONETOUCH VERIO test strip USE AS DIRECTED 3 TIMES A DAY  6   No current facility-administered medications for this visit.    Allergies:   Adhesive; Zyvox; Bactrim; Cephalexin; Ciprofloxacin; Daptomycin; and Vancomycin    Social History:  The patient  reports that she has never smoked. She has never used smokeless tobacco. She reports that she does not drink alcohol or use illicit drugs.   Family History:  The patient's family history includes Diabetes in her mother; Hypertension in her mother; Stroke in her father and mother. There is no history of Heart attack.    ROS:  Please see the history of present illness.   Otherwise, review of systems are positive for runny edema and leg swelling, some  cough.   All other systems are reviewed and negative.    PHYSICAL EXAM: VS:  BP 130/60 mmHg  Pulse 63  Ht 5' 3.5" (1.613 m)  Wt 168 lb (76.204 kg)  BMI 29.29 kg/m2  SpO2 97% , BMI Body mass index is 29.29 kg/(m^2). GEN: Well nourished, well developed, in no acute distress Neck: no JVD, HJR, carotid bruits, or masses Cardiac: WUJ;8/1 harsh systolic murmur RSB and LSB, no gallop, rubs, thrill or heave,  Respiratory:  clear to auscultation bilaterally, normal work of breathing GI: soft, nontender, nondistended, + BS MS: no deformity or atrophy Extremities: Plus 1-2 edema with some erythema bilaterally otherwise without cyanosis, clubbing, edema, good distal pulses bilaterally.  Skin: warm and dry, no rash Neuro:  Strength and sensation are intact    EKG:  EKG is not ordered today.    Recent Labs: 04/06/2015: Pro B Natriuretic peptide (BNP) 1041.0*; TSH 0.78 07/29/2015: Hemoglobin 13.7; Platelets 218 08/05/2015: BUN 38*; Creat 1.58*; Potassium 4.5; Sodium 136    Lipid Panel    Component Value Date/Time   CHOL 185 04/06/2015 1038   TRIG 76.0 04/06/2015 1038   HDL 41.20 04/06/2015 1038   CHOLHDL 4 04/06/2015 1038   VLDL 15.2 04/06/2015 1038   LDLCALC 129* 04/06/2015 1038   LDLDIRECT 132.7 04/22/2007 0815      Wt Readings from Last 3 Encounters:  08/16/15 168 lb (76.204 kg)  07/29/15 170 lb 11.2 oz (77.429 kg)  07/29/15 170 lb 12.8 oz (77.474 kg)      Other studies Reviewed: Additional studies/ records that were reviewed today include and review of the records demonstrates:  2Decho 08/05/15 Study Conclusions  - Left ventricle: The cavity size was normal. Wall thickness was   normal. Systolic function was severely reduced. The estimated   ejection fraction was in the range of 25% to 30%. Severe diffuse   hypokinesis with distinct regional wall motion abnormalities.   Dyskinesis  and scarring of the inferior and inferoseptal   myocardium. Probable akinesis of the  apical myocardium. Features   are consistent with a pseudonormal left ventricular filling   pattern, with concomitant abnormal relaxation and increased   filling pressure (grade 2 diastolic dysfunction). - Aortic valve: Severe diffuse calcification involving the   noncoronary cusp. Noncoronary cusp mobility was severely   restricted. Transvalvular velocity was increased less than   expected, due to low cardiac output. There was moderate stenosis.   There was trivial regurgitation. Valve area (VTI): 0.74 cm^2.   Valve area (Vmean): 0.66 cm^2. The gradients undestimate the true   sevrity of aortic stenosis, but the aortic stenosis appears   non-severe, based on the relatively preserved mobility of 2 of 3   cusps and the early peaking jet configuration. - Mitral valve: Calcified annulus. - Left atrium: The atrium was mildly dilated. - Right ventricle: Systolic function was moderately    ASSESSMENT AND PLAN:  Chronic combined systolic and diastolic heart failure (HCC) Patient had trouble with fluid overload after vacationing for 6 days. She thinks it was due to the salt intake from eating out. She is doing much better since Lasix increased to 60 mg alternating with 100 mg every other day. We'll continue this dose and check renal function today. Continue 2 g sodium diet. 2D echo showed EF to be 25-30% with grade 2 diastolic dysfunction and moderate aortic stenosis. Could consider changing Lasix to torsemide in the future if she does not respond. Follow-up with Dr. Harrington Challenger in one month.  Essential hypertension BP controlled  Aortic stenosis 2-D echo shows severely calcified non-coronary cusp that is severely restricted there was moderate stenosis and trivial AI. Valve area 0.74 cm valve area mean 0.66 cm gradients underestimate the true severity of aortic stenosis but it appears nonsevere. Follow-up with Dr. Harrington Challenger.  CKD (chronic kidney disease), stage III Creatinine 1.58 08/05/15, was 1.77 the  week before. We'll repeat today     Signed, Ermalinda Barrios, PA-C  08/16/2015 12:50 PM    [address], [place], [place]  [address] Phone: [phone]; Fax: [phone]

## 2015-08-16 NOTE — Addendum Note (Signed)
Addended by: Freada Bergeron on: 08/16/2015 05:12 PM   Modules accepted: Orders, Medications

## 2015-08-16 NOTE — Assessment & Plan Note (Signed)
2-D echo shows severely calcified non-coronary cusp that is severely restricted there was moderate stenosis and trivial AI. Valve area 0.74 cm valve area mean 0.66 cm gradients underestimate the true severity of aortic stenosis but it appears nonsevere. Follow-up with Dr. Harrington Challenger.

## 2015-08-17 ENCOUNTER — Telehealth: Payer: Self-pay | Admitting: Physician Assistant

## 2015-08-17 DIAGNOSIS — R799 Abnormal finding of blood chemistry, unspecified: Secondary | ICD-10-CM

## 2015-08-17 NOTE — Telephone Encounter (Signed)
Called patient back about lab results. Per Ermalinda Barrios PA, Kidney function up a little but still near baseline. Repeat bmet in 2 weeks. May need to decrease lasix dose if it continues to climb. Patient verbalized understanding. Patient will come in on November 15 for lab work.

## 2015-08-17 NOTE — Telephone Encounter (Signed)
New message      Returning a call to a nurse to get lab results

## 2015-08-19 ENCOUNTER — Other Ambulatory Visit: Payer: Self-pay | Admitting: Internal Medicine

## 2015-08-22 ENCOUNTER — Other Ambulatory Visit: Payer: Self-pay

## 2015-08-22 MED ORDER — FUROSEMIDE 20 MG PO TABS: ORAL_TABLET | ORAL | Status: DC

## 2015-08-26 ENCOUNTER — Encounter: Payer: Self-pay | Admitting: Internal Medicine

## 2015-08-26 ENCOUNTER — Ambulatory Visit (INDEPENDENT_AMBULATORY_CARE_PROVIDER_SITE_OTHER): Payer: Medicare Other | Admitting: Internal Medicine

## 2015-08-26 VITALS — BP 122/60 | HR 60 | Ht 63.5 in | Wt 166.1 lb

## 2015-08-26 DIAGNOSIS — I5042 Chronic combined systolic (congestive) and diastolic (congestive) heart failure: Secondary | ICD-10-CM

## 2015-08-26 DIAGNOSIS — I70203 Unspecified atherosclerosis of native arteries of extremities, bilateral legs: Secondary | ICD-10-CM

## 2015-08-26 LAB — BASIC METABOLIC PANEL
BUN: 43 mg/dL — AB (ref 7–25)
CALCIUM: 9.4 mg/dL (ref 8.6–10.4)
CHLORIDE: 100 mmol/L (ref 98–110)
CO2: 24 mmol/L (ref 20–31)
CREATININE: 1.66 mg/dL — AB (ref 0.60–0.93)
GLUCOSE: 293 mg/dL — AB (ref 65–99)
Potassium: 4.4 mmol/L (ref 3.5–5.3)
Sodium: 136 mmol/L (ref 135–146)

## 2015-08-26 NOTE — Progress Notes (Signed)
Cardiology Office Note   Date:  08/26/2015   ID:  CHALA GUL, DOB Feb 13, 1946, MRN [id number]  PCP:  No PCP Per Patient  Cardiologist:   Dorris Carnes, MD   Chief Complaint  Patient presents with  . Shortness of Breath      History of Present Illness: [name] is a 69 y.o. female with a history of  chronic systolic and diastolic heart failure. She has a history of CAD (s/p CABG- cath in 09/2011 showed patent LIMA to LAD with occlusion of distal LAD; High grade stenosis of nondominant RCA; Signifcant disease of prox LCx; SVG to L PDA occluded, SVG to OM patent), Aortic stenosis, DM, dyslipidemia, PVD and CKD, Echo 05/2013: Mild LVH, EF 30%, RVEF depressed; mod aortic stenosis. Repeat 2-D echo was performed on 08/05/15 and showed EF to be 25-30% with severe diffuse hypokinesis with distinct regional wall motion abnormalities. She also had grade 2 diastolic dysfunction moderate aortic stenosis.  She was seen on 11/1 for SOB and edema  Diuretic adjusted  Some mixup initially   She is now on 60 alternating with 100 lasix qd. Edema has improved She denise CP  Breathing is fair.  Takes aticity as tolerated. Avoids salt   Current Outpatient Prescriptions  Medication Sig Dispense Refill  . aspirin EC 325 MG EC tablet Take 325 mg by mouth daily.     [name] atorvastatin (LIPITOR) 40 MG tablet TAKE 1 TABLET BY MOUTH AT BEDTIME 30 tablet 11  . Cyanocobalamin 1000 MCG/ML KIT Inject 1,000 mcg as directed every 8 (eight) weeks.    . furosemide (LASIX) 20 MG tablet Alternate 3 pills (60 mg total) with 5 pills (100 mg total) every other day until next office visit.. 120 tablet 1  . insulin detemir (LEVEMIR) 100 UNIT/ML injection Inject 10-22 Units into the skin 2 (two) times daily. Inject 22 units subutaneously every morning (10am)  and 10 units at bedtime (10pm)    . insulin lispro (HUMALOG) 100 UNIT/ML injection Inject 1-12 Units into the skin 4 (four) times daily as needed for high  blood sugar (CBG >120 when eating or CBG>140 but not eating).    . isosorbide mononitrate (IMDUR) 30 MG 24 hr tablet Take 30 mg by mouth at bedtime.    [name] levothyroxine (SYNTHROID, LEVOTHROID) 100 MCG tablet Take 100 mcg by mouth daily before breakfast.   5  . metoprolol succinate (TOPROL-XL) 25 MG 24 hr tablet Take 0.5 tablets (12.5 mg total) by mouth daily. 30 tablet 3   No current facility-administered medications for this visit.    Allergies:   Adhesive; Zyvox; Bactrim; Cephalexin; Ciprofloxacin; Daptomycin; and Vancomycin   Past Medical History  Diagnosis Date  . HTN (hypertension)   . Hypothyroidism   . History of recurrent UTIs   . CAD (coronary artery disease)     s/p CABG x3 in 1993; last cath in 2010  . Scleroderma ([place])   . Bilateral carpal tunnel syndrome 1970s  . PVD (peripheral vascular disease) ([place])     Toes amputated from left foot and has had prior bypass on left leg. Followed by Dr. Donnetta Hutching  . Claudication ([place])   . Aortic stenosis     0.8cm2 on echo in 10/2011  . Hyperlipidemia     on Lipitor  . Cat bite 2009    with MRSA; required I & D  . Heart murmur   . Blood transfusion     no reaction from transfusion; "when I had  heart surgery" (08/28/2012)  . GERD (gastroesophageal reflux disease)   . CHF (congestive heart failure) ([place]) Aug. 2012    Echo 10/2011: Mild LVH, EF 30%, apex akinetic, septal HK, grade 2 diastolic dysfunction, or functionally bicuspid aortic valve, mild aortic stenosis by gradient, severe by calculated AVA-suspect A. Aortic stenosis probably moderate, trivial MR, mild LAE, mild RAE.   [name] Cellulitis   . Anemia   . B12 deficiency anemia     B 12 injection "monthly since 05/2012" (08/28/2012)  . Iron deficiency anemia 06/13/2012    "iron infusion" (08/28/2012  . Anginal pain ([place])     "history; not currently" (08/28/2012)  . Myocardial infarction [facility]) 1986    "silent"  . History of bronchitis     "I've had it twice in my life" (08/28/2012)  .  Exertional dyspnea     "because of the heart failure" (08/28/2012)  . Type 1 diabetes ([place]) 10/1949  . Stomach ulcer 1981?    "on Tagament for ~ 1 yr" (08/28/2012)  . Seizure ([place])     ? seizure like event in 2010; workup included stress testing which led to repeat cath.   . Arthritis     "hands, hips, all over" (08/28/2012  . Osteoarthritis of both feet   . Breast cancer ([place])     left  . Cellulitis June 2013, Nov. 2013 and Feb. 14, 2014    Left leg    Past Surgical History  Procedure Laterality Date  . Carpal tunnel release  ~ 1970's    bilaterally  . Mastectomy  11/1982    Left Breast  . Vitrectomy  1990's-2004    "both eyes; I've had total of 4"  . Tubal ligation  1984  . Tonsillectomy  1952    "?adenoids"   . Appendectomy  1991  . Femoral bypass      left leg "related to transmetatarsal amputation" (08/28/2012)  . Breast biopsy with sentinel lymph node biopsy and needle localization  1984  . Coronary artery bypass graft  1992    LIMA to LAD, SVG to distal LCX & SVG to Marginal  . Cardiac catheterization  2010    LIMA to LAD patent yet with occluded distal LAD after graft insertion & retrograde is occluded. 3-4 septal perforators arise &are patent. SVG to a large marginal patent; SVG presumably to the distal dominant LCX is occluded, moderately high grade stenosis of the AV circumflex, but with distal stenoses involving a severely diseased marginal branch not amenable  to PCI  nor is inferior branch   . Cardiac catheterization  10/2011    "unsuccessful attempt of stenting" (08/28/2012)  . Cardiac catheterization  1992; 09/2011  . Breast biopsy    . Trigger finger release  1980's    right thumb  . Incision and drainage of wound  ~ 1967    "infection in left foot" (08/28/2012)  . Incision and drainage of wound  2009    "3 ORs on my right hand after cat bite" (08/28/2012)  . 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 Cataract extraction w/  intraocular lens  implant, bilateral  1990's  . Amputation Right 05/27/2013    Procedure: AMPUTATION DIGIT FIFTH TOE;  Surgeon: Rosetta Posner, MD;  Location: [facility];  Service: Vascular;  Laterality: Right;  . Left and right heart catheterization with coronary angiogram N/A 09/17/2011    Procedure: LEFT AND RIGHT HEART CATHETERIZATION WITH CORONARY ANGIOGRAM;  Surgeon: Hillary Bow, MD;  Location: [facility] C[facility] LAB;  Service: Cardiovascular;  Laterality: N/A;  . Percutaneous coronary stent intervention (pci-s) N/A 11/15/2011    Procedure: PERCUTANEOUS CORONARY STENT INTERVENTION (PCI-S);  Surgeon: Hillary Bow, MD;  Location: [facility] CATH LAB;  Service: Cardiovascular;  Laterality: N/A;  . Lower extremity angiogram N/A 05/25/2013    Procedure: LOWER EXTREMITY ANGIOGRAM POSS INTERVENTION;  Surgeon: Conrad Munising, MD;  Location: [facility] CATH LAB;  Service: Cardiovascular;  Laterality: N/A;     Social History:  The patient  reports that she has never smoked. She has never used smokeless tobacco. She reports that she does not drink alcohol or use illicit drugs.   Family History:  The patient's family history includes Diabetes in her mother; Hypertension in her mother; Stroke in her father and mother. There is no history of Heart attack.    ROS:  Please see the history of present illness. All other systems are reviewed and  Negative to the above problem except as noted.    PHYSICAL EXAM: VS:  BP 122/60 mmHg  Pulse 60  Ht 5' 3.5" (1.613 m)  Wt 75.352 kg (166 lb 1.9 oz)  BMI 28.96 kg/m2  GEN: Pt, in no acute distress HEENT: normal Neck: no JVD, carotid bruits, or masses Cardiac: RRR; no murmurs, II/VI sytolic murmur, or gallops,  Tr to 1 +edema  Respiratory:  clear to auscultation bilaterally, normal work of breathing GI: soft, nontender, nondistended, + BS  No hepatomegaly  MS: no deformity Moving all extremities   Skin: warm and dry, no rash Neuro:  Strength and sensation are intact Psych: euthymic  mood, full affect   EKG:  EKG is not ordered today.   Lipid Panel    Component Value Date/Time   CHOL 185 04/06/2015 1038   TRIG 76.0 04/06/2015 1038   HDL 41.20 04/06/2015 1038   CHOLHDL 4 04/06/2015 1038   VLDL 15.2 04/06/2015 1038   LDLCALC 129* 04/06/2015 1038   LDLDIRECT 132.7 04/22/2007 0815      Wt Readings from Last 3 Encounters:  08/26/15 75.352 kg (166 lb 1.9 oz)  08/16/15 76.204 kg (168 lb)  07/29/15 77.429 kg (170 lb 11.2 oz)      ASSESSMENT AND PLAN:  1.  Chronic systolic / diastolic CHF  Volume status has improved.  WIll check BMET and BNP today. Need to review with renal BP control, renal function  Would like to add hydralazine t oregimen but only if it does not compromise renal function  2.  Renal  Check BMET and BNP today   Encouraged pt to make f/u appt in renal clinic   3.  CAD  No symptoms to sugg angina.  4.  AS  Probable moderate by echo.    4.  HTN  Good control    6  HL  Keep on lipior       Signed, Dorris Carnes, MD  08/26/2015 2:32 PM    [address], [place], [place]  [address] Phone: [phone]; Fax: [phone]

## 2015-08-26 NOTE — Patient Instructions (Signed)
Medication Instructions:   NO CHANGE  Labwork:  Your physician recommends that you HAVE LAB WORK TODAY  Follow-Up:  Your physician recommends that you schedule a follow-up appointment in: 3 TO [place]    If you need a refill on your cardiac medications before your next appointment, please call your pharmacy.

## 2015-08-27 LAB — BRAIN NATRIURETIC PEPTIDE: Brain Natriuretic Peptide: 1257.9 pg/mL — ABNORMAL HIGH (ref 0.0–100.0)

## 2015-08-30 ENCOUNTER — Other Ambulatory Visit (INDEPENDENT_AMBULATORY_CARE_PROVIDER_SITE_OTHER): Payer: Medicare Other

## 2015-08-30 DIAGNOSIS — R799 Abnormal finding of blood chemistry, unspecified: Secondary | ICD-10-CM

## 2015-08-30 DIAGNOSIS — E109 Type 1 diabetes mellitus without complications: Secondary | ICD-10-CM | POA: Diagnosis not present

## 2015-08-30 DIAGNOSIS — R809 Proteinuria, unspecified: Secondary | ICD-10-CM | POA: Diagnosis not present

## 2015-08-30 DIAGNOSIS — I10 Essential (primary) hypertension: Secondary | ICD-10-CM | POA: Diagnosis not present

## 2015-08-30 DIAGNOSIS — E039 Hypothyroidism, unspecified: Secondary | ICD-10-CM | POA: Diagnosis not present

## 2015-08-31 ENCOUNTER — Telehealth: Payer: Self-pay | Admitting: Internal Medicine

## 2015-08-31 NOTE — Telephone Encounter (Signed)
Follow Up  Pt returned call again. Pt states she has no clue as to who or why she is receiving a call. Per pt please call back and please leave a name when you leave the message.

## 2015-08-31 NOTE — Telephone Encounter (Signed)
Informed pt of lab results. Pt verbalized understanding. 

## 2015-09-02 ENCOUNTER — Other Ambulatory Visit (HOSPITAL_BASED_OUTPATIENT_CLINIC_OR_DEPARTMENT_OTHER): Payer: Medicare Other

## 2015-09-02 ENCOUNTER — Encounter: Payer: Self-pay | Admitting: Hematology and Oncology

## 2015-09-02 ENCOUNTER — Other Ambulatory Visit: Payer: Self-pay | Admitting: Hematology and Oncology

## 2015-09-02 ENCOUNTER — Telehealth: Payer: Self-pay | Admitting: Hematology and Oncology

## 2015-09-02 ENCOUNTER — Ambulatory Visit (HOSPITAL_BASED_OUTPATIENT_CLINIC_OR_DEPARTMENT_OTHER): Payer: Medicare Other | Admitting: Hematology and Oncology

## 2015-09-02 VITALS — BP 128/52 | HR 80 | Temp 97.8°F | Resp 18 | Ht 63.5 in | Wt 165.8 lb

## 2015-09-02 DIAGNOSIS — E039 Hypothyroidism, unspecified: Secondary | ICD-10-CM | POA: Diagnosis not present

## 2015-09-02 DIAGNOSIS — I70203 Unspecified atherosclerosis of native arteries of extremities, bilateral legs: Secondary | ICD-10-CM | POA: Diagnosis not present

## 2015-09-02 DIAGNOSIS — D518 Other vitamin B12 deficiency anemias: Secondary | ICD-10-CM | POA: Diagnosis not present

## 2015-09-02 DIAGNOSIS — I5042 Chronic combined systolic (congestive) and diastolic (congestive) heart failure: Secondary | ICD-10-CM

## 2015-09-02 LAB — CBC & DIFF AND RETIC
BASO%: 0.8 % (ref 0.0–2.0)
BASOS ABS: 0.1 10*3/uL (ref 0.0–0.1)
EOS ABS: 0.3 10*3/uL (ref 0.0–0.5)
EOS%: 5.1 % (ref 0.0–7.0)
HCT: 43.8 % (ref 34.8–46.6)
HEMOGLOBIN: 14.4 g/dL (ref 11.6–15.9)
IMMATURE RETIC FRACT: 6.9 % (ref 1.60–10.00)
LYMPH#: 1 10*3/uL (ref 0.9–3.3)
LYMPH%: 16.1 % (ref 14.0–49.7)
MCH: 30.8 pg (ref 25.1–34.0)
MCHC: 32.9 g/dL (ref 31.5–36.0)
MCV: 93.8 fL (ref 79.5–101.0)
MONO#: 0.9 10*3/uL (ref 0.1–0.9)
MONO%: 14.4 % — ABNORMAL HIGH (ref 0.0–14.0)
NEUT#: 3.8 10*3/uL (ref 1.5–6.5)
NEUT%: 63.6 % (ref 38.4–76.8)
PLATELETS: 190 10*3/uL (ref 145–400)
RBC: 4.67 10*6/uL (ref 3.70–5.45)
RDW: 14.7 % — ABNORMAL HIGH (ref 11.2–14.5)
RETIC CT ABS: 81.26 10*3/uL (ref 33.70–90.70)
Retic %: 1.74 % (ref 0.70–2.10)
WBC: 6 10*3/uL (ref 3.9–10.3)

## 2015-09-02 LAB — VITAMIN B12: VITAMIN B 12: 671 pg/mL (ref 211–911)

## 2015-09-02 NOTE — Assessment & Plan Note (Signed)
Since we discontinued her vitamin B-12 injection last year, the vitamin B-12 level has dropped by more than half. I had resumed vitamin B-12 injection every other month this year.  Repeat vitamin B2 level is pending. If her levels are satisfactory, after her injection next month, I plan to change the frequency of injection to every 3 months. I recommend consideration for self administration of vitamin B-12 injection but due to her vision difficulties from diabetes, the patient would prefer to come back on a regular basis for B-12 injection. I will see her back next year.

## 2015-09-02 NOTE — Telephone Encounter (Signed)
per pof to sch pt appt-gave pt copy of avs °

## 2015-09-02 NOTE — Progress Notes (Signed)
[place] OFFICE PROGRESS NOTE  No PCP Per Patient SUMMARY OF HEMATOLOGIC HISTORY:  This patient was noted to have combine iron deficiency and B12 deficiency in 2013. She received 1 dose of intravenous iron feraheme on 06/13/2012. Around that same time, she was started on B12 injection on a monthly basis. Subsequently, vitamin B-12 injection was discontinued and her B-12 level dropped. Subsequently, vitamin B-12 injection was resumed and was given every other month.  Starting in of 2016, the frequency of B-12 injection was changed to every 3 months  INTERVAL HISTORY: [name] 69 y.o. female returns for further follow-up. She continues to battle with difficult to control insulin-dependent diabetes. She also has a significant cardiac issues with congestive heart failure and is currently on medical management  Her energy level is fair. She is doing well on the B-12 injections  I have reviewed the past medical history, past surgical history, social history and family history with the patient and they are unchanged from previous note.  ALLERGIES:  is allergic to adhesive; zyvox; bactrim; cephalexin; ciprofloxacin; daptomycin; and vancomycin.  MEDICATIONS:  Current Outpatient Prescriptions  Medication Sig Dispense Refill  . aspirin EC 325 MG EC tablet Take 325 mg by mouth daily.     [name] atorvastatin (LIPITOR) 40 MG tablet TAKE 1 TABLET BY MOUTH AT BEDTIME 30 tablet 11  . Cyanocobalamin 1000 MCG/ML KIT Inject 1,000 mcg as directed every 8 (eight) weeks.    . furosemide (LASIX) 20 MG tablet Alternate 3 pills (60 mg total) with 5 pills (100 mg total) every other day until next office visit.. 120 tablet 1  . insulin detemir (LEVEMIR) 100 UNIT/ML injection Inject 10-22 Units into the skin 2 (two) times daily. Inject 22 units subutaneously every morning (10am)  and 10 units at bedtime (10pm)    . insulin lispro (HUMALOG) 100 UNIT/ML injection Inject 1-12 Units into the skin  4 (four) times daily as needed for high blood sugar (CBG >120 when eating or CBG>140 but not eating).    . isosorbide mononitrate (IMDUR) 30 MG 24 hr tablet Take 30 mg by mouth at bedtime.    [name] levothyroxine (SYNTHROID, LEVOTHROID) 100 MCG tablet Take 100 mcg by mouth daily before breakfast.   5  . metoprolol succinate (TOPROL-XL) 25 MG 24 hr tablet Take 0.5 tablets (12.5 mg total) by mouth daily. 30 tablet 3   No current facility-administered medications for this visit.     REVIEW OF SYSTEMS:   Constitutional: Denies fevers, chills or night sweats Eyes: Denies blurriness of vision Ears, nose, mouth, throat, and face: Denies mucositis or sore throat Cardiovascular: Denies palpitation, chest discomfort  Gastrointestinal:  Denies nausea, heartburn or change in bowel habits Skin: Denies abnormal skin rashes Lymphatics: Denies new lymphadenopathy or easy bruising Neurological:Denies numbness, tingling or new weaknesses Behavioral/Psych: Mood is stable, no new changes  All other systems were reviewed with the patient and are negative.  PHYSICAL EXAMINATION: ECOG PERFORMANCE STATUS: 1 - Symptomatic but completely ambulatory  Filed Vitals:   09/02/15 1115  BP: 128/52  Pulse: 80  Temp: 97.8 F (36.6 C)  Resp: 18   Filed Weights   09/02/15 1115  Weight: 165 lb 12.8 oz (75.206 kg)    GENERAL:alert, no distress and comfortable SKIN: skin color, texture, turgor are normal, no rashes or significant lesions EYES: normal, Conjunctiva are pink and non-injected, sclera clear Musculoskeletal:no cyanosis of digits and no clubbing  NEURO: alert & oriented x 3 with fluent speech, no  focal motor/sensory deficits  LABORATORY DATA:  I have reviewed the data as listed Results for orders placed or performed in visit on 09/02/15 (from the past 48 hour(s))  CBC & Diff and Retic     Status: Abnormal   Collection Time: 09/02/15 11:01 AM  Result Value Ref Range   WBC 6.0 3.9 - 10.3 10e3/uL   NEUT#  3.8 1.5 - 6.5 10e3/uL   HGB 14.4 11.6 - 15.9 g/dL   HCT 43.8 34.8 - 46.6 %   Platelets 190 145 - 400 10e3/uL   MCV 93.8 79.5 - 101.0 fL   MCH 30.8 25.1 - 34.0 pg   MCHC 32.9 31.5 - 36.0 g/dL   RBC 4.67 3.70 - 5.45 10e6/uL   RDW 14.7 (H) 11.2 - 14.5 %   lymph# 1.0 0.9 - 3.3 10e3/uL   MONO# 0.9 0.1 - 0.9 10e3/uL   Eosinophils Absolute 0.3 0.0 - 0.5 10e3/uL   Basophils Absolute 0.1 0.0 - 0.1 10e3/uL   NEUT% 63.6 38.4 - 76.8 %   LYMPH% 16.1 14.0 - 49.7 %   MONO% 14.4 (H) 0.0 - 14.0 %   EOS% 5.1 0.0 - 7.0 %   BASO% 0.8 0.0 - 2.0 %   Retic % 1.74 0.70 - 2.10 %   Retic Ct Abs 81.26 33.70 - 90.70 10e3/uL   Immature Retic Fract 6.90 1.60 - 10.00 %    Lab Results  Component Value Date   WBC 6.0 09/02/2015   HGB 14.4 09/02/2015   HCT 43.8 09/02/2015   MCV 93.8 09/02/2015   PLT 190 09/02/2015  ASSESSMENT & PLAN:  Vitamin B12 deficiency (dietary) anemia Since we discontinued her vitamin B-12 injection last year, the vitamin B-12 level has dropped by more than half. I had resumed vitamin B-12 injection every other month this year.  Repeat vitamin B2 level is pending. If her levels are satisfactory, after her injection next month, I plan to change the frequency of injection to every 3 months. I recommend consideration for self administration of vitamin B-12 injection but due to her vision difficulties from diabetes, the patient would prefer to come back on a regular basis for B-12 injection. I will see her back next year.  Chronic combined systolic and diastolic heart failure ([place]) She is currently on medical management and appears to be doing well. I reviewed with her guidelines regarding preventive care  She does not have primary care doctor We discussed pneumococcal vaccination. She had received pneumococcal vaccination with PCV 23 at age 16 and then Prevnar 11 at age 65. Per guidelines, she will qualify for PCV 23 again this year. I will administer pneumococcal vaccination for her  when she returns next month for B-12 injection   All questions were answered. The patient knows to call the clinic with any problems, questions or concerns. No barriers to learning was detected.  I spent 15 minutes counseling the patient face to face. The total time spent in the appointment was 20 minutes and more than 50% was on counseling.     [facility], Bruno Leach, MD 11/18/20163:38 PM

## 2015-09-02 NOTE — Assessment & Plan Note (Signed)
She is currently on medical management and appears to be doing well. I reviewed with her guidelines regarding preventive care  She does not have primary care doctor We discussed pneumococcal vaccination. She had received pneumococcal vaccination with PCV 23 at age 69 and then Prevnar 10 at age 49. Per guidelines, she will qualify for PCV 23 again this year. I will administer pneumococcal vaccination for her when she returns next month for B-12 injection

## 2015-09-05 ENCOUNTER — Telehealth: Payer: Self-pay | Admitting: *Deleted

## 2015-09-05 NOTE — Telephone Encounter (Signed)
-----   Message from Heath Lark, MD sent at 09/05/2015  7:34 AM EST ----- Regarding: B12 Pls let her know B12 level is good Plan for injection every 3 months as discussed and pneumococcal vaccine next month with B12 ----- Message -----    From: [facility] Interface    Sent: 09/02/2015  11:17 AM      To: Heath Lark, MD

## 2015-09-05 NOTE — Telephone Encounter (Signed)
LVM for pt to return nurse's call regarding Dr. Gorsuch's message below.  

## 2015-09-07 ENCOUNTER — Telehealth: Payer: Self-pay | Admitting: *Deleted

## 2015-09-07 NOTE — Telephone Encounter (Signed)
TC from patient requesting to postpone pneumonia vaccine until March 2017 when she comes in for her every 3 month B12 injection. She is scheduled for B12 on 09/29/15 which is fine with her, but she does not want the pneumonia vaccine so close to christmas.  She states she got 'sick' from the flu shot and does not want to risk getting sick from the pneumonia vaccination.

## 2015-09-29 ENCOUNTER — Ambulatory Visit (HOSPITAL_BASED_OUTPATIENT_CLINIC_OR_DEPARTMENT_OTHER): Payer: Medicare Other

## 2015-09-29 VITALS — BP 123/54 | HR 67 | Temp 97.8°F

## 2015-09-29 DIAGNOSIS — D518 Other vitamin B12 deficiency anemias: Secondary | ICD-10-CM | POA: Diagnosis present

## 2015-09-29 MED ORDER — CYANOCOBALAMIN 1000 MCG/ML IJ SOLN
1000.0000 ug | Freq: Once | INTRAMUSCULAR | Status: AC
  Administered 2015-09-29: 1000 ug via INTRAMUSCULAR

## 2015-09-29 MED ORDER — PNEUMOCOCCAL VAC POLYVALENT 25 MCG/0.5ML IJ INJ
0.5000 mL | INJECTION | Freq: Once | INTRAMUSCULAR | Status: DC
  Filled 2015-09-29: qty 0.5

## 2015-09-30 ENCOUNTER — Ambulatory Visit: Payer: Medicare Other | Admitting: Internal Medicine

## 2015-10-27 ENCOUNTER — Telehealth: Payer: Self-pay | Admitting: Internal Medicine

## 2015-10-27 MED ORDER — FUROSEMIDE 20 MG PO TABS: ORAL_TABLET | ORAL | Status: DC

## 2015-10-27 NOTE — Telephone Encounter (Signed)
Rx was sent to pt's pharmacy as requested. Confirmation received. °

## 2015-10-27 NOTE — Telephone Encounter (Signed)
New Message   *STAT* If patient is at the pharmacy, call can be transferred to refill team.   1. Which medications need to be refilled? (please list name of each medication and dose if known) furosemide (LASIX) 20 MG tablet   2. Which pharmacy/location (including street and city if local pharmacy) is medication to be sent to? [facility]   They will have to Escribe the medication because the phone lines are down. Pt will need the medication today because she is going out of town    3. Do they need a 30 day or 90 day supply? 30 day supply

## 2015-12-02 ENCOUNTER — Encounter: Payer: Self-pay | Admitting: Vascular Surgery

## 2015-12-02 ENCOUNTER — Encounter: Payer: Self-pay | Admitting: Internal Medicine

## 2015-12-02 ENCOUNTER — Ambulatory Visit (INDEPENDENT_AMBULATORY_CARE_PROVIDER_SITE_OTHER): Payer: Medicare Other | Admitting: Internal Medicine

## 2015-12-02 VITALS — BP 126/64 | HR 71 | Ht 63.5 in | Wt 163.0 lb

## 2015-12-02 DIAGNOSIS — I5042 Chronic combined systolic (congestive) and diastolic (congestive) heart failure: Secondary | ICD-10-CM

## 2015-12-02 DIAGNOSIS — I350 Nonrheumatic aortic (valve) stenosis: Secondary | ICD-10-CM

## 2015-12-02 DIAGNOSIS — I359 Nonrheumatic aortic valve disorder, unspecified: Secondary | ICD-10-CM | POA: Diagnosis not present

## 2015-12-02 LAB — BASIC METABOLIC PANEL
BUN: 41 mg/dL — AB (ref 7–25)
CO2: 26 mmol/L (ref 20–31)
CREATININE: 1.59 mg/dL — AB (ref 0.60–0.93)
Calcium: 9.4 mg/dL (ref 8.6–10.4)
Chloride: 103 mmol/L (ref 98–110)
Glucose, Bld: 160 mg/dL — ABNORMAL HIGH (ref 65–99)
POTASSIUM: 4.5 mmol/L (ref 3.5–5.3)
Sodium: 140 mmol/L (ref 135–146)

## 2015-12-02 LAB — LIPID PANEL
CHOLESTEROL: 136 mg/dL (ref 125–200)
HDL: 35 mg/dL — ABNORMAL LOW (ref >=46)
LDL Cholesterol: 82 mg/dL (ref <130)
TRIGLYCERIDES: 93 mg/dL (ref <150)
Total CHOL/HDL Ratio: 3.9 Ratio (ref <=5.0)
VLDL: 19 mg/dL (ref <30)

## 2015-12-02 LAB — CBC
HCT: 40.4 % (ref 36.0–46.0)
Hemoglobin: 13.3 g/dL (ref 12.0–15.0)
MCH: 29.6 pg (ref 26.0–34.0)
MCHC: 32.9 g/dL (ref 30.0–36.0)
MCV: 89.8 fL (ref 78.0–100.0)
MPV: 9.3 fL (ref 8.6–12.4)
PLATELETS: 196 10*3/uL (ref 150–400)
RBC: 4.5 MIL/uL (ref 3.87–5.11)
RDW: 17.4 % — AB (ref 11.5–15.5)
WBC: 6.5 10*3/uL (ref 4.0–10.5)

## 2015-12-02 NOTE — Progress Notes (Signed)
Cardiology Office Note   Date:  12/02/2015   ID:  [name], DOB [birth year], MRN [id number]  PCP:  No PCP Per Patient  Cardiologist:   Dorris Carnes, MD    F/U of CAD    History of Present Illness: [name] is a 70 y.o. female with a history of  chronic systolic and diastolic heart failure. She has a history of CAD (s/p CABG- cath in 09/2011 showed patent LIMA to LAD with occlusion of distal LAD; High grade stenosis of nondominant RCA; Signifcant disease of prox LCx; SVG to L PDA occluded, SVG to OM patent), Aortic stenosis, DM, dyslipidemia, PVD and CKD, Echo 05/2013: Mild LVH, EF 30%, RVEF depressed; mod aortic stenosis. Repeat 2-D echo was performed on 08/05/15 and showed EF to be 25-30% with severe diffuse hypokinesis with distinct regional wall motion abnormalities. She also had grade 2 diastolic dysfunction moderate aortic stenosis.  I saw her in clinic in November 2016  Pt says in December and January she felt great  No SOB  Getting around without walker 3 wks ago doing house work  Bending a lot   Pt says she has been SOB since  Seems to be getting better slowly  No signif LE edema    No CP    Current Outpatient Prescriptions  Medication Sig Dispense Refill  . aspirin EC 325 MG EC tablet Take 325 mg by mouth daily.     [name] atorvastatin (LIPITOR) 40 MG tablet TAKE 1 TABLET BY MOUTH AT BEDTIME 30 tablet 11  . Cyanocobalamin 1000 MCG/ML KIT Inject 1,000 mcg as directed every 8 (eight) weeks.    . furosemide (LASIX) 20 MG tablet Alternate 3 pills (60 mg total) with 5 pills (100 mg total) every other day until next office visit.. 120 tablet 1  . insulin detemir (LEVEMIR) 100 UNIT/ML injection Inject 10-22 Units into the skin 2 (two) times daily. Inject 22 units subutaneously every morning (10am)  and 10 units at bedtime (10pm)    . insulin lispro (HUMALOG) 100 UNIT/ML injection Inject 1-12 Units into the skin 4 (four) times daily as needed for high blood sugar  (sliding scale).     . isosorbide mononitrate (IMDUR) 30 MG 24 hr tablet Take 30 mg by mouth at bedtime.    [name] levothyroxine (SYNTHROID, LEVOTHROID) 100 MCG tablet Take 100 mcg by mouth daily before breakfast.   5  . metoprolol succinate (TOPROL-XL) 25 MG 24 hr tablet Take 0.5 tablets (12.5 mg total) by mouth daily. 30 tablet 3   No current facility-administered medications for this visit.    Allergies:   Adhesive; Zyvox; Bactrim; Cephalexin; Ciprofloxacin; Daptomycin; and Vancomycin   Past Medical History  Diagnosis Date  . HTN (hypertension)   . Hypothyroidism   . History of recurrent UTIs   . CAD (coronary artery disease)     s/p CABG x3 in 1993; last cath in 2010  . Scleroderma ([place])   . Bilateral carpal tunnel syndrome 1970s  . PVD (peripheral vascular disease) ([place])     Toes amputated from left foot and has had prior bypass on left leg. Followed by Dr. Donnetta Hutching  . Claudication ([place])   . Aortic stenosis     0.8cm2 on echo in 10/2011  . Hyperlipidemia     on Lipitor  . Cat bite 2009    with MRSA; required I & D  . Heart murmur   . Blood transfusion     no reaction from transfusion; "  when I had heart surgery" (08/28/2012)  . GERD (gastroesophageal reflux disease)   . CHF (congestive heart failure) ([place]) Aug. 2012    Echo 10/2011: Mild LVH, EF 30%, apex akinetic, septal HK, grade 2 diastolic dysfunction, or functionally bicuspid aortic valve, mild aortic stenosis by gradient, severe by calculated AVA-suspect A. Aortic stenosis probably moderate, trivial MR, mild LAE, mild RAE.   [name] Cellulitis   . Anemia   . B12 deficiency anemia     B 12 injection "monthly since 05/2012" (08/28/2012)  . Iron deficiency anemia 06/13/2012    "iron infusion" (08/28/2012  . Anginal pain ([place])     "history; not currently" (08/28/2012)  . Myocardial infarction [facility]) 1986    "silent"  . History of bronchitis     "I've had it twice in my life" (08/28/2012)  . Exertional dyspnea     "because of the  heart failure" (08/28/2012)  . Type 1 diabetes ([place]) 10/1949  . Stomach ulcer 1981?    "on Tagament for ~ 1 yr" (08/28/2012)  . Seizure ([place])     ? seizure like event in 2010; workup included stress testing which led to repeat cath.   . Arthritis     "hands, hips, all over" (08/28/2012  . Osteoarthritis of both feet   . Breast cancer ([place])     left  . Cellulitis June 2013, Nov. 2013 and Feb. 14, 2014    Left leg    Past Surgical History  Procedure Laterality Date  . Carpal tunnel release  ~ 1970's    bilaterally  . Mastectomy  11/1982    Left Breast  . Vitrectomy  1990's-2004    "both eyes; I've had total of 4"  . Tubal ligation  1984  . Tonsillectomy  1952    "?adenoids"   . Appendectomy  1991  . Femoral bypass      left leg "related to transmetatarsal amputation" (08/28/2012)  . Breast biopsy with sentinel lymph node biopsy and needle localization  1984  . Coronary artery bypass graft  1992    LIMA to LAD, SVG to distal LCX & SVG to Marginal  . Cardiac catheterization  2010    LIMA to LAD patent yet with occluded distal LAD after graft insertion & retrograde is occluded. 3-4 septal perforators arise &are patent. SVG to a large marginal patent; SVG presumably to the distal dominant LCX is occluded, moderately high grade stenosis of the AV circumflex, but with distal stenoses involving a severely diseased marginal branch not amenable  to PCI  nor is inferior branch   . Cardiac catheterization  10/2011    "unsuccessful attempt of stenting" (08/28/2012)  . Cardiac catheterization  1992; 09/2011  . Breast biopsy    . Trigger finger release  1980's    right thumb  . Incision and drainage of wound  ~ 1967    "infection in left foot" (08/28/2012)  . Incision and drainage of wound  2009    "3 ORs on my right hand after cat bite" (08/28/2012)  . 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 Cataract extraction w/ intraocular lens  implant, bilateral  1990's    . Amputation Right 05/27/2013    Procedure: AMPUTATION DIGIT FIFTH TOE;  Surgeon: Rosetta Posner, MD;  Location: [facility];  Service: Vascular;  Laterality: Right;  . Left and right heart catheterization with coronary angiogram N/A 09/17/2011    Procedure: LEFT AND RIGHT HEART CATHETERIZATION  WITH CORONARY ANGIOGRAM;  Surgeon: Hillary Bow, MD;  Location: [facility] CATH LAB;  Service: Cardiovascular;  Laterality: N/A;  . Percutaneous coronary stent intervention (pci-s) N/A 11/15/2011    Procedure: PERCUTANEOUS CORONARY STENT INTERVENTION (PCI-S);  Surgeon: Hillary Bow, MD;  Location[facility] LAB;  Service: Cardiovascular;  Laterality: N/A;  . Lower extremity angiogram N/A 05/25/2013    Procedure: LOWER EXTREMITY ANGIOGRAM POSS INTERVENTION;  Surgeon: Conrad Lincoln, MD;  Location[facility] C[facility] LAB;  Service: Cardiovascular;  Laterality: N/A;     Social History:  The patient  reports that she has never smoked. She has never used smokeless tobacco. She reports that she does not drink alcohol or use illicit drugs.   Family History:  The patient's family history includes Diabetes in her mother; Hypertension in her mother; Stroke in her father and mother. There is no history of Heart attack.    ROS:  Please see the history of present illness. All other systems are reviewed and  Negative to the above problem except as noted.    PHYSICAL EXAM: VS:  BP 126/64 mmHg  Pulse 71  Ht 5' 3.5" (1.613 m)  Wt 73.936 kg (163 lb)  BMI 28.42 kg/m2  SpO2 97%  GEN: Well nourished, well developed, in no acute distress HEENT: normal JVP is increased Neck: no JVD, carotid bruits, or masses Cardiac: RRR; no murmurs, rubs, or gallops,Tr edema  Respiratory:  clear to auscultation bilaterally, normal work of breathing GI: soft, nontender, nondistended, + BS  No hepatomegaly  MS: no deformity Moving all extremities   Skin: warm and dry, no rash Neuro:  Strength and sensation are intact Psych: euthymic mood, full  affect   EKG:  EKG is not ordered today.   Lipid Panel    Component Value Date/Time   CHOL 185 04/06/2015 1038   TRIG 76.0 04/06/2015 1038   HDL 41.20 04/06/2015 1038   CHOLHDL 4 04/06/2015 1038   VLDL 15.2 04/06/2015 1038   LDLCALC 129* 04/06/2015 1038   LDLDIRECT 132.7 04/22/2007 0815      Wt Readings from Last 3 Encounters:  12/02/15 73.936 kg (163 lb)  09/02/15 75.206 kg (165 lb 12.8 oz)  08/26/15 75.352 kg (166 lb 1.9 oz)      ASSESSMENT AND PLAN:  1  [place] systolic CHF  Volume appearst ot be mildly increased  Pt feeling some better  Will check labs  No changes for now until labs reviewed    2.  CAD  I am not convinced of active ischemia  3.  AS   Moderate  Will follow  4  HTN  BP is OK    5  Renal  Check BMET    6  HL Wll get nonfasting panel today     7  PVOD  Has appt with T Early soon    Current medicines are reviewed at length with the patient today.  The patient does not have concerns regarding medicines.  The following changes have been made:   Labs/ tests ordered today include:  Orders Placed This Encounter  Procedures  . Basic metabolic panel  . CBC     Disposition:   FU with  in   Signed, Dorris Carnes, MD  12/02/2015 2:54 PM    [facility] [place], [place], [place]  [address] Phone: [phone]; Fax: [phone]

## 2015-12-02 NOTE — Patient Instructions (Addendum)
Your physician recommends that you continue on your current medications as directed. Please refer to the Current Medication list given to you today.  Your physician recommends that you return for lab work TODAY (BMET, BNP, CBC, LIPIDS)  Your physician wants you to follow-up in: [place].  You will receive a reminder letter in the mail two months in advance. If you don't receive a letter, please call our office to schedule the follow-up appointment.

## 2015-12-03 LAB — BRAIN NATRIURETIC PEPTIDE: Brain Natriuretic Peptide: 607.9 pg/mL — ABNORMAL HIGH (ref <100)

## 2015-12-08 ENCOUNTER — Other Ambulatory Visit: Payer: Self-pay | Admitting: Hematology and Oncology

## 2015-12-13 ENCOUNTER — Ambulatory Visit (INDEPENDENT_AMBULATORY_CARE_PROVIDER_SITE_OTHER): Payer: Medicare Other | Admitting: Vascular Surgery

## 2015-12-13 ENCOUNTER — Ambulatory Visit (INDEPENDENT_AMBULATORY_CARE_PROVIDER_SITE_OTHER)
Admission: RE | Admit: 2015-12-13 | Discharge: 2015-12-13 | Disposition: A | Discharge status: Home / Self Care | Payer: Medicare Other | Source: Ambulatory Visit | Attending: Vascular Surgery | Admitting: Vascular Surgery

## 2015-12-13 ENCOUNTER — Ambulatory Visit (HOSPITAL_COMMUNITY)
Admission: RE | Admit: 2015-12-13 | Discharge: 2015-12-13 | Disposition: A | Discharge status: Home / Self Care | Payer: Medicare Other | Source: Ambulatory Visit | Attending: Vascular Surgery | Admitting: Vascular Surgery

## 2015-12-13 ENCOUNTER — Encounter: Payer: Self-pay | Admitting: Vascular Surgery

## 2015-12-13 VITALS — BP 108/56 | HR 65 | Temp 97.3°F | Resp 14 | Ht 63.5 in | Wt 163.0 lb

## 2015-12-13 DIAGNOSIS — Z95828 Presence of other vascular implants and grafts: Secondary | ICD-10-CM | POA: Diagnosis not present

## 2015-12-13 DIAGNOSIS — Z48812 Encounter for surgical aftercare following surgery on the circulatory system: Secondary | ICD-10-CM | POA: Diagnosis not present

## 2015-12-13 DIAGNOSIS — I70201 Unspecified atherosclerosis of native arteries of extremities, right leg: Secondary | ICD-10-CM | POA: Insufficient documentation

## 2015-12-13 DIAGNOSIS — Z89421 Acquired absence of other right toe(s): Secondary | ICD-10-CM | POA: Diagnosis not present

## 2015-12-13 DIAGNOSIS — Z9862 Peripheral vascular angioplasty status: Secondary | ICD-10-CM | POA: Insufficient documentation

## 2015-12-13 DIAGNOSIS — I739 Peripheral vascular disease, unspecified: Secondary | ICD-10-CM

## 2015-12-13 NOTE — Progress Notes (Signed)
[facility] [place]  Patient name: [name] MRN: [id number] DOB: [birth year] Sex: female  REASON FOR VISIT:  Hollow up of diffuse peripheral vascular occlusive disease  HPI: [name] is a 70 y.o. female  Here today for follow-up of diffuse peripheral vascular occlusive disease. She actually looks good today. She does walk with her walker but is not having any shortness of breath. She has severe congestive heart failure. She denies any new tissue loss on her lower extremities. She is status post transmetatarsal amputation on the left and fifth toe amputation on the right  Past Medical History  Diagnosis Date  . HTN (hypertension)   . Hypothyroidism   . History of recurrent UTIs   . CAD (coronary artery disease)     s/p CABG x3 in 1993; last cath in 2010  . Scleroderma ([place])   . Bilateral carpal tunnel syndrome 1970s  . PVD (peripheral vascular disease) ([place])     Toes amputated from left foot and has had prior bypass on left leg. Followed by Dr. Donnetta Hutching  . Claudication ([place])   . Aortic stenosis     0.8cm2 on echo in 10/2011  . Hyperlipidemia     on Lipitor  . Cat bite 2009    with MRSA; required I & D  . Heart murmur   . Blood transfusion     no reaction from transfusion; "when I had heart surgery" (08/28/2012)  . GERD (gastroesophageal reflux disease)   . CHF (congestive heart failure) ([place]) Aug. 2012    Echo 10/2011: Mild LVH, EF 30%, apex akinetic, septal HK, grade 2 diastolic dysfunction, or functionally bicuspid aortic valve, mild aortic stenosis by gradient, severe by calculated AVA-suspect A. Aortic stenosis probably moderate, trivial MR, mild LAE, mild RAE.   [name] Cellulitis   . Anemia   . B12 deficiency anemia     B 12 injection "monthly since 05/2012" (08/28/2012)  . Iron deficiency anemia 06/13/2012    "iron infusion" (08/28/2012  . Anginal pain ([place])     "history; not currently" (08/28/2012)  . Myocardial infarction [facility]) 1986    "silent"  . History of bronchitis     "I've had it twice in my life" (08/28/2012)  . Exertional dyspnea     "because of the heart failure" (08/28/2012)  . Type 1 diabetes ([place]) 10/1949  . Stomach ulcer 1981?    "on Tagament for ~ 1 yr" (08/28/2012)  . Seizure ([place])     ? seizure like event in 2010; workup included stress testing which led to repeat cath.   . Arthritis     "hands, hips, all over" (08/28/2012  . Osteoarthritis of both feet   . Breast cancer ([place])     left  . Cellulitis June 2013, Nov. 2013 and Feb. 14, 2014    Left leg    Family History  Problem Relation Age of Onset  . Diabetes Mother   . Heart attack Neg Hx   . Stroke Mother   . Stroke Father   . Hypertension Mother     SOCIAL HISTORY: Social History  Substance Use Topics  . Smoking status: Never Smoker   . Smokeless tobacco: Never Used  . Alcohol Use: No    Allergies  Allergen Reactions  . Adhesive [Tape] Other (See Comments)    "old [employer] adhesive tape; takes my skin off; can use paper tape"  . Zyvox [Linezolid]     Thrombocytopenia developed to 50k after several  weeks of therapy  . Bactrim [Sulfamethoxazole-Trimethoprim] Other (See Comments)    Hyperkalemia and Increased creatinine  . Cephalexin Swelling and Rash  . Ciprofloxacin Swelling and Rash  . Daptomycin     Had elevated CPK on therapy, not clear that this was due to cubicin  . Vancomycin     ? If this played role in her Acute kidney injury    Current Outpatient Prescriptions  Medication Sig Dispense Refill  . aspirin EC 325 MG EC tablet Take 325 mg by mouth daily.     [name] atorvastatin (LIPITOR) 40 MG tablet TAKE 1 TABLET BY MOUTH AT BEDTIME 30 tablet 11  . Cyanocobalamin 1000 MCG/ML KIT Inject 1,000 mcg as directed every 8 (eight) weeks.    . furosemide (LASIX) 20 MG tablet Alternate 3 pills (60 mg total) with 5 pills (100 mg total) every other day until next office visit.. 120 tablet 1  . insulin detemir (LEVEMIR) 100  UNIT/ML injection Inject 10-22 Units into the skin 2 (two) times daily. Inject 22 units subutaneously every morning (10am)  and 10 units at bedtime (10pm)    . insulin lispro (HUMALOG) 100 UNIT/ML injection Inject 1-12 Units into the skin 4 (four) times daily as needed for high blood sugar (sliding scale).     . isosorbide mononitrate (IMDUR) 30 MG 24 hr tablet Take 30 mg by mouth at bedtime.    [name] levothyroxine (SYNTHROID, LEVOTHROID) 100 MCG tablet Take 100 mcg by mouth daily before breakfast.   5  . metoprolol succinate (TOPROL-XL) 25 MG 24 hr tablet Take 0.5 tablets (12.5 mg total) by mouth daily. 30 tablet 3   No current facility-administered medications for this visit.    REVIEW OF SYSTEMS:  '[X]'  denotes positive finding, '[ ]'  denotes negative finding Cardiac  Comments:  Chest pain or chest pressure:    Shortness of breath upon exertion: x   Short of breath when lying flat:    Irregular heart rhythm:        Vascular    Pain in calf, thigh, or hip brought on by ambulation:    Pain in feet at night that wakes you up from your sleep:     Blood clot in your veins:    Leg swelling:  x       Pulmonary    Oxygen at home:    Productive cough:     Wheezing:         Neurologic    Sudden weakness in arms or legs:     Sudden numbness in arms or legs:     Sudden onset of difficulty speaking or slurred speech:    Temporary loss of vision in one eye:     Problems with dizziness:         Gastrointestinal    Blood in stool:     Vomited blood:         Genitourinary    Burning when urinating:     Blood in urine:        Psychiatric    Major depression:         Hematologic    Bleeding problems:    Problems with blood clotting too easily:        Skin    Rashes or ulcers:        Constitutional    Fever or chills:      PHYSICAL EXAM: Filed Vitals:   12/13/15 1543  BP: 108/56  Pulse: 65  Temp: 97.3 F (  36.3 C)  Resp: 14  Height: 5' 3.5" (1.613 m)  Weight: 163 lb (73.936  kg)  SpO2: 95%    GENERAL: The patient is a well-nourished female, in no acute distress. The vital signs are documented above.  VASCULAR:  Easily palpable lateral graft pulse on the left leg with her family anterior bypass. No palpable pedal pulses bilaterally PULMONARY: There is good air exchange bilaterally without wheezing or rales. MUSCULOSKELETAL: There are no major deformities or cyanosis. NEUROLOGIC: No focal weakness or paresthesias are detected. SKIN: There are no ulcers or rashes noted.  diffuse swelling in both lower extremities with skin scaling PSYCHIATRIC: The patient has a normal affect.  DATA:   bilateral lower extremity duplex reveals patency of her left femoral anterior bypass with no evidence of elevated velocities. Right leg reveals chronic high-grade stenosis in the superficial femoral artery which is unchanged from 6 months ago.  MEDICAL ISSUES:  stable overall. Continue her usual exercise program. We'll notify should she develop any new ischemic symptoms. Otherwise we'll see her in 6 months with repeat ultrasound surveillance  No Follow-up on file.   Curt Jews [facility] [place]: [phone]

## 2015-12-26 ENCOUNTER — Other Ambulatory Visit: Payer: Self-pay | Admitting: Hematology and Oncology

## 2015-12-28 ENCOUNTER — Ambulatory Visit (HOSPITAL_BASED_OUTPATIENT_CLINIC_OR_DEPARTMENT_OTHER): Payer: Medicare Other

## 2015-12-28 VITALS — BP 96/57 | HR 64 | Temp 97.6°F

## 2015-12-28 DIAGNOSIS — D518 Other vitamin B12 deficiency anemias: Secondary | ICD-10-CM

## 2015-12-28 MED ORDER — CYANOCOBALAMIN 1000 MCG/ML IJ SOLN
1000.0000 ug | Freq: Once | INTRAMUSCULAR | Status: AC
  Administered 2015-12-28: 1000 ug via INTRAMUSCULAR

## 2015-12-28 NOTE — Progress Notes (Signed)
[name] doesn't want to receive Pnue-23 today.  She has a lot of events coming up in the next two weeks and doesn't want to risk a reaction to the injection.  Will get it in June when she returns for next vit B12 injection

## 2016-01-16 ENCOUNTER — Encounter: Payer: Self-pay | Admitting: Internal Medicine

## 2016-01-16 ENCOUNTER — Ambulatory Visit (INDEPENDENT_AMBULATORY_CARE_PROVIDER_SITE_OTHER): Payer: Medicare Other | Admitting: Internal Medicine

## 2016-01-16 VITALS — BP 132/63 | HR 62 | Ht 63.5 in | Wt 169.4 lb

## 2016-01-16 DIAGNOSIS — I5042 Chronic combined systolic (congestive) and diastolic (congestive) heart failure: Secondary | ICD-10-CM

## 2016-01-16 DIAGNOSIS — R55 Syncope and collapse: Secondary | ICD-10-CM | POA: Diagnosis not present

## 2016-01-16 DIAGNOSIS — I10 Essential (primary) hypertension: Secondary | ICD-10-CM

## 2016-01-16 NOTE — Patient Instructions (Addendum)
Your physician recommends that you continue on your current medications as directed. Please refer to the Current Medication list given to you today. Your physician recommends that you return for lab work in: BMET, BNP Your physician has recommended that you wear an event monitor. Event monitors are medical devices that record the heart's electrical activity. Doctors most often [place] these monitors to diagnose arrhythmias. Arrhythmias are problems with the speed or rhythm of the heartbeat. The monitor is a small, portable device. You can wear one while you do your normal daily activities. This is usually used to diagnose what is causing palpitations/syncope (passing out).  PLEASE SCHEDULE FOR TOMORROW AT 4 PM ON KATY'S SCHEDULE PER SHELLY

## 2016-01-16 NOTE — Progress Notes (Signed)
Cardiology Office Note   Date:  01/16/2016   ID:  [name], DOB 17-Feb-1946, MRN [id number]  PCP:  No PCP Per Patient  Cardiologist:   Dorris Carnes, MD   Pt hear for eval of syncope    History of Present Illness: [name] is a 70 y.o. female with a history of CAD (s/p CABG- cath in 09/2011 showed patent LIMA to LAD with occlusion of distal LAD; High grade stenosis of nondominant RCA; Signifcant disease of prox LCx; SVG to L PDA occluded, SVG to OM patent), Aortic stenosis, DM, dyslipidemia, PVD and CKD, Echo 05/2013: Mild LVH, EF 30%, RVEF depressed; mod aortic stenosis. Repeat 2-D echo was performed on 08/05/15 and showed EF to be 25-30% with severe diffuse hypokinesis with distinct regional wall motion abnormalities. She also had grade 2 diastolic dysfunction moderate aortic stenosis  I saw the pt in Feb   She called in yesterday saying she hasnt been feeling good  Had episdoeds of synocpe at beach    Her recent history: Tues  Lunch  Had to walk  Out of breath with walking   Mondamin over to pick something   Bent over again and passed out Wed-Fri  Took it easy  OK  Not dizzy  Slower motiion  Sat  In charge of high school reunion  Busy   ON dance floor sitting on walker  Got SOB  Pedaled to talk to someone  Leaned back on walker  Passed out  20 sec  EMS called   In lobby eval  145/92 Ow 99% Went back into party   Got ready to go home  Pushed out  Passed out again briefly when got up to get in car [place] back to room  Watch TV  Went to bed   Sunday  Felt OK Left today  Feeling OK  No CP  Some edema  Says she always gets off when leaves home    Outpatient Prescriptions Prior to Visit  Medication Sig Dispense Refill  . aspirin EC 325 MG EC tablet Take 325 mg by mouth daily.     [name] atorvastatin (LIPITOR) 40 MG tablet TAKE 1 TABLET BY MOUTH AT BEDTIME 30 tablet 11  . Cyanocobalamin 1000 MCG/ML KIT Inject 1,000 mcg as directed every 8 (eight)  weeks.    . furosemide (LASIX) 20 MG tablet Alternate 3 pills (60 mg total) with 5 pills (100 mg total) every other day until next office visit.. 120 tablet 1  . insulin detemir (LEVEMIR) 100 UNIT/ML injection Inject 10-22 Units into the skin 2 (two) times daily. Inject 22 units subutaneously every morning (10am)  and 10 units at bedtime (10pm)    . insulin lispro (HUMALOG) 100 UNIT/ML injection Inject 1-12 Units into the skin 4 (four) times daily as needed for high blood sugar (sliding scale).     . isosorbide mononitrate (IMDUR) 30 MG 24 hr tablet Take 30 mg by mouth at bedtime.    [name] levothyroxine (SYNTHROID, LEVOTHROID) 100 MCG tablet Take 100 mcg by mouth daily before breakfast.   5  . metoprolol succinate (TOPROL-XL) 25 MG 24 hr tablet Take 0.5 tablets (12.5 mg total) by mouth daily. 30 tablet 3   No facility-administered medications prior to visit.     Allergies:   Adhesive; Zyvox; Bactrim; Cephalexin; Ciprofloxacin; Daptomycin; and Vancomycin   Past Medical History  Diagnosis Date  . HTN (hypertension)   . Hypothyroidism   .  History of recurrent UTIs   . CAD (coronary artery disease)     s/p CABG x3 in 1993; last cath in 2010  . Scleroderma ([place])   . Bilateral carpal tunnel syndrome 1970s  . PVD (peripheral vascular disease) ([place])     Toes amputated from left foot and has had prior bypass on left leg. Followed by Dr. Donnetta Hutching  . Claudication ([place])   . Aortic stenosis     0.8cm2 on echo in 10/2011  . Hyperlipidemia     on Lipitor  . Cat bite 2009    with MRSA; required I & D  . Heart murmur   . Blood transfusion     no reaction from transfusion; "when I had heart surgery" (08/28/2012)  . GERD (gastroesophageal reflux disease)   . CHF (congestive heart failure) ([place]) Aug. 2012    Echo 10/2011: Mild LVH, EF 30%, apex akinetic, septal HK, grade 2 diastolic dysfunction, or functionally bicuspid aortic valve, mild aortic stenosis by gradient, severe by calculated AVA-suspect A.  Aortic stenosis probably moderate, trivial MR, mild LAE, mild RAE.   [name] Cellulitis   . Anemia   . B12 deficiency anemia     B 12 injection "monthly since 05/2012" (08/28/2012)  . Iron deficiency anemia 06/13/2012    "iron infusion" (08/28/2012  . Anginal pain ([place])     "history; not currently" (08/28/2012)  . Myocardial infarction [facility]) 1986    "silent"  . History of bronchitis     "I've had it twice in my life" (08/28/2012)  . Exertional dyspnea     "because of the heart failure" (08/28/2012)  . Type 1 diabetes ([place]) 10/1949  . Stomach ulcer 1981?    "on Tagament for ~ 1 yr" (08/28/2012)  . Seizure ([place])     ? seizure like event in 2010; workup included stress testing which led to repeat cath.   . Arthritis     "hands, hips, all over" (08/28/2012  . Osteoarthritis of both feet   . Breast cancer ([place])     left  . Cellulitis June 2013, Nov. 2013 and Feb. 14, 2014    Left leg    Past Surgical History  Procedure Laterality Date  . Carpal tunnel release  ~ 1970's    bilaterally  . Mastectomy  11/1982    Left Breast  . Vitrectomy  1990's-2004    "both eyes; I've had total of 4"  . Tubal ligation  1984  . Tonsillectomy  1952    "?adenoids"   . Appendectomy  1991  . Femoral bypass      left leg "related to transmetatarsal amputation" (08/28/2012)  . Breast biopsy with sentinel lymph node biopsy and needle localization  1984  . Coronary artery bypass graft  1992    LIMA to LAD, SVG to distal LCX & SVG to Marginal  . Cardiac catheterization  2010    LIMA to LAD patent yet with occluded distal LAD after graft insertion & retrograde is occluded. 3-4 septal perforators arise &are patent. SVG to a large marginal patent; SVG presumably to the distal dominant LCX is occluded, moderately high grade stenosis of the AV circumflex, but with distal stenoses involving a severely diseased marginal branch not amenable  to PCI  nor is inferior branch   . Cardiac catheterization  10/2011     "unsuccessful attempt of stenting" (08/28/2012)  . Cardiac catheterization  1992; 09/2011  . Breast biopsy    . Trigger finger release  1980's  right thumb  . Incision and drainage of wound  ~ 1967    "infection in left foot" (08/28/2012)  . Incision and drainage of wound  2009    "3 ORs on my right hand after cat bite" (08/28/2012)  . 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 Cataract extraction w/ intraocular lens  implant, bilateral  1990's  . Amputation Right 05/27/2013    Procedure: AMPUTATION DIGIT FIFTH TOE;  Surgeon: Rosetta Posner, MD;  Location: [place];  Service: Vascular;  Laterality: Right;  . Left and right heart catheterization with coronary angiogram N/A 09/17/2011    Procedure: LEFT AND RIGHT HEART CATHETERIZATION WITH CORONARY ANGIOGRAM;  Surgeon: Hillary Bow, MD;  Location: [facility] CATH LAB;  Service: Cardiovascular;  Laterality: N/A;  . Percutaneous coronary stent intervention (pci-s) N/A 11/15/2011    Procedure: PERCUTANEOUS CORONARY STENT INTERVENTION (PCI-S);  Surgeon: Hillary Bow, MD;  Location: [facility] CATH LAB;  Service: Cardiovascular;  Laterality: N/A;  . Lower extremity angiogram N/A 05/25/2013    Procedure: LOWER EXTREMITY ANGIOGRAM POSS INTERVENTION;  Surgeon: Conrad [place], MD;  Location: [facility] CATH LAB;  Service: Cardiovascular;  Laterality: N/A;     Social History:  The patient  reports that she has never smoked. She has never used smokeless tobacco. She reports that she does not drink alcohol or use illicit drugs.   Family History:  The patient's family history includes Diabetes in her mother; Hypertension in her mother; Stroke in her father and mother. There is no history of Heart attack.    ROS:  Please see the history of present illness. All other systems are reviewed and  Negative to the above problem except as noted.    PHYSICAL EXAM: VS:  BP 132/63 mmHg  Pulse 62  Ht 5' 3.5" (1.613 m)  Wt 169 lb 6.4 oz (76.839 kg)  BMI  29.53 kg/m2  SpO2 95%  GEN: Well nourished, well developed, in no acute distress HEENT: normal Neck: no JVD, carotid bruits, or masses Cardiac: RRR; no murmurs, rubs, or gallops, 1+ edema  Respiratory:  clear to auscultation bilaterally, normal work of breathing GI: soft, nontender, nondistended, + BS  No hepatomegaly  MS: no deformity Moving all extremities   Skin: warm and dry, no rash Neuro:  Strength and sensation are intact Psych: euthymic mood, full affect   EKG:  EKG is ordered today.  SB 57 bpm  ANteroseptal MI  T wave inversion laterally     Lipid Panel    Component Value Date/Time   CHOL 136 12/02/2015 1522   TRIG 93 12/02/2015 1522   HDL 35* 12/02/2015 1522   CHOLHDL 3.9 12/02/2015 1522   VLDL 19 12/02/2015 1522   LDLCALC 82 12/02/2015 1522   LDLDIRECT 132.7 04/22/2007 0815      Wt Readings from Last 3 Encounters:  01/16/16 169 lb 6.4 oz (76.839 kg)  12/13/15 163 lb (73.936 kg)  12/02/15 163 lb (73.936 kg)      ASSESSMENT AND PLAN:  1  Dizziness/syncope  Some of episodes sound orthostatic  She is not orhostatic today on exam  Actually BP is high   Exam with evid of some volume increase   I would recomm labs (BMET and BNP )  Set up for event monitor. Will review with EP given severe LV dysfunction I told the pt that she should not be driving for 6 month unless reversible cause found  2  Acute on  chronic systilc CHF  Volume status does appear t obe up  Will set up for labs and decid on lasx  Told her to take 5 lasix tomorrow  3.  CAD  I am not convinced of active angina  4.  AS  Will review  Was mod on lasat echo in Oct 2016  I am not convinced worse     Labs/ tests ordered today include: Orders Placed This Encounter  Procedures  . Basic metabolic panel  . CBC  . Brain natriuretic peptide    Signed, Dorris Carnes, MD  01/16/2016 5:00 PM    [address], [place], [place]  [address] Phone: [phone]; Fax: [phone]

## 2016-01-17 ENCOUNTER — Other Ambulatory Visit (INDEPENDENT_AMBULATORY_CARE_PROVIDER_SITE_OTHER): Payer: Medicare Other | Admitting: *Deleted

## 2016-01-17 ENCOUNTER — Ambulatory Visit (INDEPENDENT_AMBULATORY_CARE_PROVIDER_SITE_OTHER): Payer: Medicare Other

## 2016-01-17 DIAGNOSIS — R55 Syncope and collapse: Secondary | ICD-10-CM

## 2016-01-17 DIAGNOSIS — I5042 Chronic combined systolic (congestive) and diastolic (congestive) heart failure: Secondary | ICD-10-CM

## 2016-01-17 DIAGNOSIS — I10 Essential (primary) hypertension: Secondary | ICD-10-CM | POA: Diagnosis not present

## 2016-01-17 LAB — BASIC METABOLIC PANEL
BUN: 59 mg/dL — ABNORMAL HIGH (ref 7–25)
CO2: 27 mmol/L (ref 20–31)
Calcium: 8.9 mg/dL (ref 8.6–10.4)
Chloride: 102 mmol/L (ref 98–110)
Creat: 1.8 mg/dL — ABNORMAL HIGH (ref 0.60–0.93)
GLUCOSE: 229 mg/dL — AB (ref 65–99)
POTASSIUM: 4.3 mmol/L (ref 3.5–5.3)
SODIUM: 140 mmol/L (ref 135–146)

## 2016-01-17 NOTE — Addendum Note (Signed)
Addended by: Eulis Foster on: 01/17/2016 03:41 PM   Modules accepted: Orders

## 2016-01-18 LAB — BRAIN NATRIURETIC PEPTIDE: BRAIN NATRIURETIC PEPTIDE: 990.1 pg/mL — AB (ref <100)

## 2016-01-19 ENCOUNTER — Telehealth: Payer: Self-pay | Admitting: Internal Medicine

## 2016-01-19 DIAGNOSIS — I5042 Chronic combined systolic (congestive) and diastolic (congestive) heart failure: Secondary | ICD-10-CM

## 2016-01-19 NOTE — Telephone Encounter (Signed)
She is wearing a [employer] monitor.  She called them at 4:30 am because her monitor was vibrating and lighting up. They did some troubleshooting with her over the phone.  The end result is they are sending her new wires and new leads. She is currently not connected.  The representative from [employer] asked her if we have been contacting her regarding reports that have come through. I advised that we have not received any printouts from the company, but that I will check with the monitor tech for any rhythms that we need to address.  Patient states that she hasn't manually reported anything.  To monitor techs for follow up.

## 2016-01-19 NOTE — Telephone Encounter (Signed)
I talked to the rep from [employer]. She is having lead loss possible lotion and her monitor is malfunctioning. Earlier today a replacement monitor was sent to her. We have not received any recording as of yet.

## 2016-01-19 NOTE — Telephone Encounter (Signed)
New message  Pt request a call back to discuss the heart monitor. She states that there are a lot of things going on. Pt declined to give further details.

## 2016-01-19 NOTE — Telephone Encounter (Signed)
I called patient to discuss repeat labs.  She will call next week, she is not if she can come Monday or Tuesday.  Notes Recorded by Fay Records, MD on 01/19/2016 at 12:24 AM REviewed with pt on phone  I would keep on regular lasix regimen Repeat BMET and BNP on Tues or Wed of next week Notes Recorded by Rodman Key, RN on 01/18/2016 at 5:28 PM I called the patient. I advised her to hold her morning dose of lasix until after she hears from Dr. Harrington Challenger tomorrow morning. She has taken Lasix 100 mg Mon, Tue and Wed this week. Her usual dose is 60 mg one day, 100 mg the next. I adv to drink fluids, not excessively, but to her thirst. She denies SOB and says she actually feels some better after being home from her trip.  She is wearing event monitor.

## 2016-01-20 DIAGNOSIS — R55 Syncope and collapse: Secondary | ICD-10-CM | POA: Diagnosis not present

## 2016-01-24 ENCOUNTER — Other Ambulatory Visit: Payer: Medicare Other

## 2016-01-25 ENCOUNTER — Other Ambulatory Visit (INDEPENDENT_AMBULATORY_CARE_PROVIDER_SITE_OTHER): Payer: Medicare Other

## 2016-01-25 DIAGNOSIS — I5042 Chronic combined systolic (congestive) and diastolic (congestive) heart failure: Secondary | ICD-10-CM | POA: Diagnosis not present

## 2016-01-26 ENCOUNTER — Telehealth: Payer: Self-pay | Admitting: *Deleted

## 2016-01-26 LAB — BASIC METABOLIC PANEL
BUN: 36 mg/dL — ABNORMAL HIGH (ref 7–25)
CHLORIDE: 100 mmol/L (ref 98–110)
CO2: 26 mmol/L (ref 20–31)
CREATININE: 1.73 mg/dL — AB (ref 0.60–0.93)
Calcium: 9.6 mg/dL (ref 8.6–10.4)
Glucose, Bld: 139 mg/dL — ABNORMAL HIGH (ref 65–99)
POTASSIUM: 4.1 mmol/L (ref 3.5–5.3)
Sodium: 139 mmol/L (ref 135–146)

## 2016-01-26 LAB — BRAIN NATRIURETIC PEPTIDE: BRAIN NATRIURETIC PEPTIDE: 627.5 pg/mL — AB (ref <100)

## 2016-01-26 NOTE — Telephone Encounter (Signed)
Pt's labs form 9 days ago were reviewed  BNP  990.1 (H) and no documentation from MD regarding the results.  Left pt a message to call back to see how she was doing.

## 2016-01-26 NOTE — Telephone Encounter (Signed)
Pt called back wanting to know results. Pt made aware results sent to MD to review. Nurse was calling to check on her and her symptoms. Pt verbalized she has no c/o at this time and will wait for results from Dr. Harrington Challenger.

## 2016-01-29 ENCOUNTER — Telehealth: Payer: Self-pay | Admitting: Internal Medicine

## 2016-01-29 DIAGNOSIS — R55 Syncope and collapse: Secondary | ICD-10-CM

## 2016-01-29 NOTE — Telephone Encounter (Signed)
Pt with syncopal spell this AM Was finished with lunch around 2 PM  Stood up   Put walker aside  Walked about 6 stelps  Felt light headed  Grabbed brother  Eyes rolled back  Passed out  OUt   Was out for about 1 minute   Called monitor company  At that time HR was 109 (ST)  BP this Am was a little low 98/    Off of metoprolol    Recomm Drink adequate fluids   Will see about setting up appt with neurology   Also f/u in cardiology  With me in next couple wks.    Reviewed labs from 4 days ago  They were OK

## 2016-01-30 NOTE — Addendum Note (Signed)
Addended by: Alvina Filbert B on: 01/30/2016 10:37 AM   Modules accepted: Orders

## 2016-01-30 NOTE — Telephone Encounter (Signed)
Scheduled follow up ov with Dr Harrington Challenger Referral placed in Epic for neurology, advised to call back if did not hear from them in next couple days

## 2016-02-13 ENCOUNTER — Ambulatory Visit (INDEPENDENT_AMBULATORY_CARE_PROVIDER_SITE_OTHER): Payer: Medicare Other | Admitting: Internal Medicine

## 2016-02-13 ENCOUNTER — Telehealth: Payer: Self-pay | Admitting: Internal Medicine

## 2016-02-13 ENCOUNTER — Encounter: Payer: Self-pay | Admitting: Internal Medicine

## 2016-02-13 VITALS — BP 144/68 | HR 94 | Ht 63.5 in | Wt 165.6 lb

## 2016-02-13 DIAGNOSIS — I5042 Chronic combined systolic (congestive) and diastolic (congestive) heart failure: Secondary | ICD-10-CM

## 2016-02-13 LAB — BASIC METABOLIC PANEL
BUN: 36 mg/dL — AB (ref 7–25)
CHLORIDE: 98 mmol/L (ref 98–110)
CO2: 25 mmol/L (ref 20–31)
CREATININE: 1.6 mg/dL — AB (ref 0.60–0.93)
Calcium: 9.5 mg/dL (ref 8.6–10.4)
GLUCOSE: 166 mg/dL — AB (ref 65–99)
Potassium: 4.8 mmol/L (ref 3.5–5.3)
Sodium: 136 mmol/L (ref 135–146)

## 2016-02-13 LAB — BRAIN NATRIURETIC PEPTIDE: BRAIN NATRIURETIC PEPTIDE: 637.7 pg/mL — AB (ref <100)

## 2016-02-13 NOTE — Telephone Encounter (Signed)
Forwarding to Dr. Harrington Challenger for her recommendations, and then I will call patient back.

## 2016-02-13 NOTE — Progress Notes (Signed)
Cardiology Office Note   Date:  02/13/2016   ID:  [name], DOB June 20, 1946, MRN [id number]  PCP:  No PCP Per Patient  Cardiologist:   Dorris Carnes, MD   F/U of CAD and CHF      History of Present Illness: [name] is a 70 y.o. female with a history ofCAD (s/p CABG- cath in 09/2011 showed patent LIMA to LAD with occlusion of distal LAD; High grade stenosis of nondominant RCA; Signifcant disease of prox LCx; SVG to L PDA occluded, SVG to OM patent), Aortic stenosis, DM, dyslipidemia, PVD and CKD, Echo 05/2013: Mild LVH, EF 30%, RVEF depressed; mod aortic stenosis. Repeat 2-D echo was performed on 08/05/15 and showed EF to be 25-30% with severe diffuse hypokinesis with distinct regional wall motion abnormalities. She also had grade 2 diastolic dysfunction moderate aortic stenosis        Outpatient Prescriptions Prior to Visit  Medication Sig Dispense Refill  . aspirin EC 325 MG EC tablet Take 325 mg by mouth daily.     [name] atorvastatin (LIPITOR) 40 MG tablet TAKE 1 TABLET BY MOUTH AT BEDTIME 30 tablet 11  . Cyanocobalamin 1000 MCG/ML KIT Inject 1,000 mcg as directed every 8 (eight) weeks.    . furosemide (LASIX) 20 MG tablet Alternate 3 pills (60 mg total) with 5 pills (100 mg total) every other day until next office visit.. 120 tablet 1  . insulin detemir (LEVEMIR) 100 UNIT/ML injection Inject 10-22 Units into the skin 2 (two) times daily. Inject 22 units subutaneously every morning (10am)  and 10 units at bedtime (10pm)    . insulin lispro (HUMALOG) 100 UNIT/ML injection Inject 1-12 Units into the skin 4 (four) times daily as needed for high blood sugar (sliding scale).     . isosorbide mononitrate (IMDUR) 30 MG 24 hr tablet Take 30 mg by mouth at bedtime.    [name] levothyroxine (SYNTHROID, LEVOTHROID) 100 MCG tablet Take 100 mcg by mouth daily before breakfast.   5  . metoprolol succinate (TOPROL-XL) 25 MG 24 hr tablet Take 0.5 tablets (12.5 mg total) by mouth daily.  (Patient not taking: Reported on 02/13/2016) 30 tablet 3   No facility-administered medications prior to visit.     Allergies:   Adhesive; Zyvox; Bactrim; Cephalexin; Ciprofloxacin; Daptomycin; and Vancomycin   Past Medical History  Diagnosis Date  . HTN (hypertension)   . Hypothyroidism   . History of recurrent UTIs   . CAD (coronary artery disease)     s/p CABG x3 in 1993; last cath in 2010  . Scleroderma ([place])   . Bilateral carpal tunnel syndrome 1970s  . PVD (peripheral vascular disease) ([place])     Toes amputated from left foot and has had prior bypass on left leg. Followed by Dr. Donnetta Hutching  . Claudication ([place])   . Aortic stenosis     0.8cm2 on echo in 10/2011  . Hyperlipidemia     on Lipitor  . Cat bite 2009    with MRSA; required I & D  . Heart murmur   . Blood transfusion     no reaction from transfusion; "when I had heart surgery" (08/28/2012)  . GERD (gastroesophageal reflux disease)   . CHF (congestive heart failure) ([place]) Aug. 2012    Echo 10/2011: Mild LVH, EF 30%, apex akinetic, septal HK, grade 2 diastolic dysfunction, or functionally bicuspid aortic valve, mild aortic stenosis by gradient, severe by calculated AVA-suspect A. Aortic stenosis probably moderate, trivial MR, mild  LAE, mild RAE.   [name] Cellulitis   . Anemia   . B12 deficiency anemia     B 12 injection "monthly since 05/2012" (08/28/2012)  . Iron deficiency anemia 06/13/2012    "iron infusion" (08/28/2012  . Anginal pain ([place])     "history; not currently" (08/28/2012)  . Myocardial infarction [facility]) 1986    "silent"  . History of bronchitis     "I've had it twice in my life" (08/28/2012)  . Exertional dyspnea     "because of the heart failure" (08/28/2012)  . Type 1 diabetes ([place]) 10/1949  . Stomach ulcer 1981?    "on Tagament for ~ 1 yr" (08/28/2012)  . Seizure ([place])     ? seizure like event in 2010; workup included stress testing which led to repeat cath.   . Arthritis     "hands, hips, all over"  (08/28/2012  . Osteoarthritis of both feet   . Breast cancer ([place])     left  . Cellulitis June 2013, Nov. 2013 and Feb. 14, 2014    Left leg    Past Surgical History  Procedure Laterality Date  . Carpal tunnel release  ~ 1970's    bilaterally  . Mastectomy  11/1982    Left Breast  . Vitrectomy  1990's-2004    "both eyes; I've had total of 4"  . Tubal ligation  1984  . Tonsillectomy  1952    "?adenoids"   . Appendectomy  1991  . Femoral bypass      left leg "related to transmetatarsal amputation" (08/28/2012)  . Breast biopsy with sentinel lymph node biopsy and needle localization  1984  . Coronary artery bypass graft  1992    LIMA to LAD, SVG to distal LCX & SVG to Marginal  . Cardiac catheterization  2010    LIMA to LAD patent yet with occluded distal LAD after graft insertion & retrograde is occluded. 3-4 septal perforators arise &are patent. SVG to a large marginal patent; SVG presumably to the distal dominant LCX is occluded, moderately high grade stenosis of the AV circumflex, but with distal stenoses involving a severely diseased marginal branch not amenable  to PCI  nor is inferior branch   . Cardiac catheterization  10/2011    "unsuccessful attempt of stenting" (08/28/2012)  . Cardiac catheterization  1992; 09/2011  . Breast biopsy    . Trigger finger release  1980's    right thumb  . Incision and drainage of wound  ~ 1967    "infection in left foot" (08/28/2012)  . Incision and drainage of wound  2009    "3 ORs on my right hand after cat bite" (08/28/2012)  . 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 Cataract extraction w/ intraocular lens  implant, bilateral  1990's  . Amputation Right 05/27/2013    Procedure: AMPUTATION DIGIT FIFTH TOE;  Surgeon: Rosetta Posner, MD;  Location: [place];  Service: Vascular;  Laterality: Right;  . Left and right heart catheterization with coronary angiogram N/A 09/17/2011    Procedure: LEFT AND RIGHT HEART  CATHETERIZATION WITH CORONARY ANGIOGRAM;  Surgeon: Hillary Bow, MD;  [facility] C[facility] LAB;  Service: Cardiovascular;  Laterality: N/A;  . Percutaneous coronary stent intervention (pci-s) N/A 11/15/2011    Procedure: PERCUTANEOUS CORONARY STENT INTERVENTION (PCI-S);  Surgeon: Hillary Bow, MD;  Location: [facility] CATH LAB;  Service: Cardiovascular;  Laterality: N/A;  . Lower extremity angiogram N/A 05/25/2013  Procedure: LOWER EXTREMITY ANGIOGRAM POSS INTERVENTION;  Surgeon: Conrad Robbinsdale, MD;  Location: [facility] CATH LAB;  Service: Cardiovascular;  Laterality: N/A;     Social History:  The patient  reports that she has never smoked. She has never used smokeless tobacco. She reports that she does not drink alcohol or use illicit drugs.   Family History:  The patient's family history includes Diabetes in her mother; Hypertension in her mother; Stroke in her father and mother. There is no history of Heart attack.    ROS:  Please see the history of present illness. All other systems are reviewed and  Negative to the above problem except as noted.    PHYSICAL EXAM: VS:  BP 144/68 mmHg  Pulse 94  Ht 5' 3.5" (1.613 m)  Wt 165 lb 9.6 oz (75.116 kg)  BMI 28.87 kg/m2  SpO2 98%  GEN: Well nourished, well developed, in no acute distress HEENT: normal Neck: Increased JVP  D, carotid bruits, or masses Cardiac: RRR; no murmurs, rubs, or gallops,1+ edema  Respiratory:  Rales at R base    GI: soft, nontender, nondistended, + BS  No hepatomegaly  MS: no deformity Moving all extremities   Skin: warm and dry, no rash Neuro:  Strength and sensation are intact Psych: euthymic mood, full affect   EKG:  EKG is not ordered today.   Lipid Panel    Component Value Date/Time   CHOL 136 12/02/2015 1522   TRIG 93 12/02/2015 1522   HDL 35* 12/02/2015 1522   CHOLHDL 3.9 12/02/2015 1522   VLDL 19 12/02/2015 1522   LDLCALC 82 12/02/2015 1522   LDLDIRECT 132.7 04/22/2007 0815      Wt Readings from Last  3 Encounters:  02/13/16 165 lb 9.6 oz (75.116 kg)  01/16/16 169 lb 6.4 oz (76.839 kg)  12/13/15 163 lb (73.936 kg)      ASSESSMENT AND PLAN:   1  Acute on chronic systolic CHF Pt wih evid of volume increase on exam  Need to check electrolytes and renal function to guide lasix dosing  Wt is up  161 at home  Does better if 156    2  CAD  I am not convinced of active angina    3  HL  Keep on statin  4  CKD  Check BMET      Signed, Dorris Carnes, MD  02/13/2016 10:55 AM    [address], [place], [place]  [address] Phone: [phone]; Fax: [phone]

## 2016-02-13 NOTE — Patient Instructions (Signed)
Your physician recommends that you continue on your current medications as directed. Please refer to the Current Medication list given to you today. Your physician recommends that you return for lab work in: today BMET, BNP Your physician recommends that you schedule a follow-up appointment in: 3 months with Dr. Harrington Challenger.

## 2016-02-13 NOTE — Telephone Encounter (Signed)
Pt was in today and was referred to [facility] Neuro to Dr. Delice Lesch , she is on maternity leave so they got her in with with Dr. Tomi Likens but not until the end of May-wants to get Dr. Alan Ripper opinion if It can wait that long-pls advise  [phone]

## 2016-02-16 ENCOUNTER — Ambulatory Visit: Payer: Medicare Other | Admitting: Neurology

## 2016-02-17 NOTE — Telephone Encounter (Signed)
Informed patient. She voices understanding. The appointment is June 3, with Dr. Tomi Likens.  She plans to keep it.

## 2016-02-17 NOTE — Telephone Encounter (Signed)
Yes  I would keep on that appt plan  I cannot get sooner nor would I refer her elsewhere

## 2016-02-24 ENCOUNTER — Telehealth: Payer: Self-pay | Admitting: Internal Medicine

## 2016-02-24 NOTE — Telephone Encounter (Signed)
Mrs. [name] is returning a call to DR. Ross . Please call   Thanks

## 2016-02-27 ENCOUNTER — Telehealth: Payer: Self-pay | Admitting: *Deleted

## 2016-02-27 ENCOUNTER — Ambulatory Visit (INDEPENDENT_AMBULATORY_CARE_PROVIDER_SITE_OTHER): Payer: Medicare Other | Admitting: Family

## 2016-02-27 ENCOUNTER — Encounter: Payer: Self-pay | Admitting: Family

## 2016-02-27 VITALS — BP 136/69 | HR 78 | Temp 98.7°F | Resp 16 | Ht 63.5 in

## 2016-02-27 DIAGNOSIS — E1151 Type 2 diabetes mellitus with diabetic peripheral angiopathy without gangrene: Secondary | ICD-10-CM | POA: Diagnosis not present

## 2016-02-27 DIAGNOSIS — L97529 Non-pressure chronic ulcer of other part of left foot with unspecified severity: Secondary | ICD-10-CM | POA: Diagnosis not present

## 2016-02-27 DIAGNOSIS — I779 Disorder of arteries and arterioles, unspecified: Secondary | ICD-10-CM

## 2016-02-27 NOTE — Patient Instructions (Signed)
Peripheral Vascular Disease Peripheral vascular disease (PVD) is a disease of the blood vessels that are not part of your heart and brain. A simple term for PVD is poor circulation. In most cases, PVD narrows the blood vessels that carry blood from your heart to the rest of your body. This can result in a decreased supply of blood to your arms, legs, and internal organs, like your stomach or kidneys. However, it most often affects a person's lower legs and feet. There are two types of PVD.  Organic PVD. This is the more common type. It is caused by damage to the structure of blood vessels.  Functional PVD. This is caused by conditions that make blood vessels contract and tighten (spasm). Without treatment, PVD tends to get worse over time. PVD can also lead to acute ischemic limb. This is when an arm or limb suddenly has trouble getting enough blood. This is a medical emergency. CAUSES Each type of PVD has many different causes. The most common cause of PVD is buildup of a fatty material (plaque) inside of your arteries (atherosclerosis). Small amounts of plaque can break off from the walls of the blood vessels and become lodged in a smaller artery. This blocks blood flow and can cause acute ischemic limb. Other common causes of PVD include:  Blood clots that form inside of blood vessels.  Injuries to blood vessels.  Diseases that cause inflammation of blood vessels or cause blood vessel spasms.  Health behaviors and health history that increase your risk of developing PVD. RISK FACTORS  You may have a greater risk of PVD if you:  Have a family history of PVD.  Have certain medical conditions, including:  High cholesterol.  Diabetes.  High blood pressure (hypertension).  Coronary heart disease.  Past problems with blood clots.  Past injury, such as burns or a broken bone. These may have damaged blood vessels in your limbs.  Buerger disease. This is caused by inflamed blood  vessels in your hands and feet.  Some forms of arthritis.  Rare birth defects that affect the arteries in your legs.  Use tobacco.  Do not get enough exercise.  Are obese.  Are age 50 or older. SIGNS AND SYMPTOMS  PVD may cause many different symptoms. Your symptoms depend on what part of your body is not getting enough blood. Some common signs and symptoms include:  Cramps in your lower legs. This may be a symptom of poor leg circulation (claudication).  Pain and weakness in your legs while you are physically active that goes away when you rest (intermittent claudication).  Leg pain when at rest.  Leg numbness, tingling, or weakness.  Coldness in a leg or foot, especially when compared with the other leg.  Skin or hair changes. These can include:  Hair loss.  Shiny skin.  Pale or bluish skin.  Thick toenails.  Inability to get or maintain an erection (erectile dysfunction). People with PVD are more prone to developing ulcers and sores on their toes, feet, or legs. These may take longer than normal to heal. DIAGNOSIS Your health care provider may diagnose PVD from your signs and symptoms. The health care provider will also do a physical exam. You may have tests to find out what is causing your PVD and determine its severity. Tests may include:  Blood pressure recordings from your arms and legs and measurements of the strength of your pulses (pulse volume recordings).  Imaging studies using sound waves to take pictures of   the blood flow through your blood vessels (Doppler ultrasound).  Injecting a dye into your blood vessels before having imaging studies using:  X-rays (angiogram or arteriogram).  Computer-generated X-rays (CT angiogram).  A powerful electromagnetic field and a computer (magnetic resonance angiogram or MRA). TREATMENT Treatment for PVD depends on the cause of your condition and the severity of your symptoms. It also depends on your age. Underlying  causes need to be treated and controlled. These include long-lasting (chronic) conditions, such as diabetes, high cholesterol, and high blood pressure. You may need to first try making lifestyle changes and taking medicines. Surgery may be needed if these do not work. Lifestyle changes may include:  Quitting smoking.  Exercising regularly.  Following a low-fat, low-cholesterol diet. Medicines may include:  Blood thinners to prevent blood clots.  Medicines to improve blood flow.  Medicines to improve your blood cholesterol levels. Surgical procedures may include:  A procedure that uses an inflated balloon to open a blocked artery and improve blood flow (angioplasty).  A procedure to put in a tube (stent) to keep a blocked artery open (stent implant).  Surgery to reroute blood flow around a blocked artery (peripheral bypass surgery).  Surgery to remove dead tissue from an infected wound on the affected limb.  Amputation. This is surgical removal of the affected limb. This may be necessary in cases of acute ischemic limb that are not improved through medical or surgical treatments. HOME CARE INSTRUCTIONS  Take medicines only as directed by your health care provider.  Do not use any tobacco products, including cigarettes, chewing tobacco, or electronic cigarettes. If you need help quitting, ask your health care provider.  Lose weight if you are overweight, and maintain a healthy weight as directed by your health care provider.  Eat a diet that is low in fat and cholesterol. If you need help, ask your health care provider.  Exercise regularly. Ask your health care provider to suggest some good activities for you.  Use compression stockings or other mechanical devices as directed by your health care provider.  Take good care of your feet.  Wear comfortable shoes that fit well.  Check your feet often for any cuts or sores. SEEK MEDICAL CARE IF:  You have cramps in your legs  while walking.  You have leg pain when you are at rest.  You have coldness in a leg or foot.  Your skin changes.  You have erectile dysfunction.  You have cuts or sores on your feet that are not healing. SEEK IMMEDIATE MEDICAL CARE IF:  Your arm or leg turns cold and blue.  Your arms or legs become red, warm, swollen, painful, or numb.  You have chest pain or trouble breathing.  You suddenly have weakness in your face, arm, or leg.  You become very confused or lose the ability to speak.  You suddenly have a very bad headache or lose your vision.   This information is not intended to replace advice given to you by your health care provider. Make sure you discuss any questions you have with your health care provider.   Document Released: 11/08/2004 Document Revised: 10/22/2014 Document Reviewed: 03/11/2014 Elsevier Interactive Patient Education 2016 [employer]  

## 2016-02-27 NOTE — Progress Notes (Signed)
[facility] [place] HISTORY AND PHYSICAL -PAD  History of Present Illness [name] is a 70 y.o. female patient of Dr. Donnetta Hutching who has known diffuse peripheral vascular occlusive disease.  She is status post transmetatarsal amputation on the left (4 surgeries from July 2010-January 2011) and fifth toe amputation on the right (05/27/2013). She is also s/p balloon angioplasty, right superficial femoral artery on 05/27/13, and left femoral to proximal anterior tibial artery bypass graft on 04/19/09.   She returns today with c/o new ulcer on her left foot.  She evidently had a callous under her left foot transmetatarsal amputation site; this "fell" off over the weekend. When she got a mirror and looked at the bottom of her foot, she noticed "A hole" in the plantar surface. She is a diabetic and has severe CHF. She reports no fever or chills and there is no odorous drainage coming for the small lesion.  She does walk with her walker but is not having any shortness of breath. She has severe congestive heart failure.  Her creatinine was 1.8 and a glucose of 229 on a BMP in April of 2017 (review of records), + CKD.  Dr. Donnetta Hutching last saw pt on 12/13/15. At that time bilateral lower extremity duplex revealed patency of her left femoral anterior bypass with no evidence of elevated velocities. Right leg reveals chronic high-grade stenosis in the superficial femoral artery which was unchanged from 6 months prior. Dr. Donnetta Hutching indicates pt was stable overall. Continue her usual exercise program. Dr. Donnetta Hutching advised pt to notify us should she develop any new ischemic symptoms. Otherwise pt was to return in 6 months with repeat ultrasound surveillance.  Pt Diabetic: Yes, no A1C result on file looking back to February 2015; pt states her A1C is always greater than 8; pt is under the care of an endocrinologist.  Pt smoker: non-smoker  Pt meds include: Statin :Yes Betablocker: No ASA:  Yes Other anticoagulants/antiplatelets: no   Past Medical History  Diagnosis Date  . HTN (hypertension)   . Hypothyroidism   . History of recurrent UTIs   . CAD (coronary artery disease)     s/p CABG x3 in 1993; last cath in 2010  . Scleroderma ([place])   . Bilateral carpal tunnel syndrome 1970s  . PVD (peripheral vascular disease) ([place])     Toes amputated from left foot and has had prior bypass on left leg. Followed by Dr. Donnetta Hutching  . Claudication ([place])   . Aortic stenosis     0.8cm2 on echo in 10/2011  . Hyperlipidemia     on Lipitor  . Cat bite 2009    with MRSA; required I & D  . Heart murmur   . Blood transfusion     no reaction from transfusion; "when I had heart surgery" (08/28/2012)  . GERD (gastroesophageal reflux disease)   . CHF (congestive heart failure) ([place]) Aug. 2012    Echo 10/2011: Mild LVH, EF 30%, apex akinetic, septal HK, grade 2 diastolic dysfunction, or functionally bicuspid aortic valve, mild aortic stenosis by gradient, severe by calculated AVA-suspect A. Aortic stenosis probably moderate, trivial MR, mild LAE, mild RAE.   [name] Cellulitis   . Anemia   . B12 deficiency anemia     B 12 injection "monthly since 05/2012" (08/28/2012)  . Iron deficiency anemia 06/13/2012    "iron infusion" (08/28/2012  . Anginal pain ([place])     "history; not currently" (08/28/2012)  . Myocardial infarction [facility]) 1986    "  silent"  . History of bronchitis     "I've had it twice in my life" (08/28/2012)  . Exertional dyspnea     "because of the heart failure" (08/28/2012)  . Type 1 diabetes ([place]) 10/1949  . Stomach ulcer 1981?    "on Tagament for ~ 1 yr" (08/28/2012)  . Seizure ([place])     ? seizure like event in 2010; workup included stress testing which led to repeat cath.   . Arthritis     "hands, hips, all over" (08/28/2012  . Osteoarthritis of both feet   . Breast cancer ([place])     left  . Cellulitis June 2013, Nov. 2013 and Feb. 14, 2014    Left leg    Social  History Social History  Substance Use Topics  . Smoking status: Never Smoker   . Smokeless tobacco: Never Used  . Alcohol Use: No    Family History Family History  Problem Relation Age of Onset  . Diabetes Mother   . Heart attack Neg Hx   . Stroke Mother   . Stroke Father   . Hypertension Mother     Past Surgical History  Procedure Laterality Date  . Carpal tunnel release  ~ 1970's    bilaterally  . Mastectomy  11/1982    Left Breast  . Vitrectomy  1990's-2004    "both eyes; I've had total of 4"  . Tubal ligation  1984  . Tonsillectomy  1952    "?adenoids"   . Appendectomy  1991  . Femoral bypass      left leg "related to transmetatarsal amputation" (08/28/2012)  . Breast biopsy with sentinel lymph node biopsy and needle localization  1984  . Coronary artery bypass graft  1992    LIMA to LAD, SVG to distal LCX & SVG to Marginal  . Cardiac catheterization  2010    LIMA to LAD patent yet with occluded distal LAD after graft insertion & retrograde is occluded. 3-4 septal perforators arise &are patent. SVG to a large marginal patent; SVG presumably to the distal dominant LCX is occluded, moderately high grade stenosis of the AV circumflex, but with distal stenoses involving a severely diseased marginal branch not amenable  to PCI  nor is inferior branch   . Cardiac catheterization  10/2011    "unsuccessful attempt of stenting" (08/28/2012)  . Cardiac catheterization  1992; 09/2011  . Breast biopsy    . Trigger finger release  1980's    right thumb  . Incision and drainage of wound  ~ 1967    "infection in left foot" (08/28/2012)  . Incision and drainage of wound  2009    "3 ORs on my right hand after cat bite" (08/28/2012)  . 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 Cataract extraction w/ intraocular lens  implant, bilateral  1990's  . Amputation Right 05/27/2013    Procedure: AMPUTATION DIGIT FIFTH TOE;  Surgeon: Rosetta Posner, MD;  Location: [facility];  Service: Vascular;  Laterality: Right;  . Left and right heart catheterization with coronary angiogram N/A 09/17/2011    Procedure: LEFT AND RIGHT HEART CATHETERIZATION WITH CORONARY ANGIOGRAM;  Surgeon: Hillary Bow, MD;  Location[facility] C[facility] LAB;  Service: Cardiovascular;  Laterality: N/A;  . Percutaneous coronary stent intervention (pci-s) N/A 11/15/2011    Procedure: PERCUTANEOUS CORONARY STENT INTERVENTION (PCI-S);  Surgeon: Hillary Bow, MD;  Location: [facility] CATH LAB;  Service: Cardiovascular;  Laterality: N/A;  .  Lower extremity angiogram N/A 05/25/2013    Procedure: LOWER EXTREMITY ANGIOGRAM POSS INTERVENTION;  Surgeon: Conrad Derby Center, MD;  Location: [facility] CATH LAB;  Service: Cardiovascular;  Laterality: N/A;    Allergies  Allergen Reactions  . Adhesive [Tape] Other (See Comments)    "old Johnson/Johnson adhesive tape; takes my skin off; can use paper tape"  . Zyvox [Linezolid]     Thrombocytopenia developed to 50k after several weeks of therapy  . Bactrim [Sulfamethoxazole-Trimethoprim] Other (See Comments)    Hyperkalemia and Increased creatinine  . Cephalexin Swelling and Rash  . Ciprofloxacin Swelling and Rash  . Daptomycin     Had elevated CPK on therapy, not clear that this was due to cubicin  . Vancomycin     ? If this played role in her Acute kidney injury    Current Outpatient Prescriptions  Medication Sig Dispense Refill  . aspirin EC 325 MG EC tablet Take 325 mg by mouth daily.     [name] atorvastatin (LIPITOR) 40 MG tablet TAKE 1 TABLET BY MOUTH AT BEDTIME 30 tablet 11  . Cyanocobalamin 1000 MCG/ML KIT Inject 1,000 mcg as directed every 8 (eight) weeks.    . furosemide (LASIX) 20 MG tablet Alternate 3 pills (60 mg total) with 5 pills (100 mg total) every other day until next office visit.. 120 tablet 1  . insulin detemir (LEVEMIR) 100 UNIT/ML injection Inject 10-22 Units into the skin 2 (two) times daily. Inject 22 units subutaneously every morning (10am)  and 10 units at  bedtime (10pm)    . insulin lispro (HUMALOG) 100 UNIT/ML injection Inject 1-12 Units into the skin 4 (four) times daily as needed for high blood sugar (sliding scale).     . isosorbide mononitrate (IMDUR) 30 MG 24 hr tablet Take 30 mg by mouth at bedtime.    [name] levothyroxine (SYNTHROID, LEVOTHROID) 100 MCG tablet Take 100 mcg by mouth daily before breakfast.   5   No current facility-administered medications for this visit.    ROS: See HPI for pertinent positives and negatives.   Physical Examination  Filed Vitals:   02/27/16 1500  BP: 136/69  Pulse: 78  Temp: 98.7 F (37.1 C)  TempSrc: Oral  Resp: 16  Height: 5' 3.5" (1.613 m)  SpO2: 99%   There is no weight on file to calculate BMI.  General: A&O x 3, WDWN. Gait: seated in wheelchair Eyes: PERRLA. Pulmonary: Respirations are non labored, rales in both bases, no wheezes or rhonchi. Cardiac: regular rhythm with occasional premature contractions, + murmur.         Carotid Bruits Right Left   Negative Negative  Aorta is not palpable. Radial pulses: palpable                           VASCULAR EXAM: Extremities without ischemic changes, without Gangrene; with an open wound: about 4 mm diameter star shaped non draining wound, about 3 mm in depth, at dorsum of left foot, under transmetatarsal site.  White tissue surrounding opening, appears white from moisture, no erythema, no drainage, no foul odor.  LE Pulses Right Left       FEMORAL   palpable   palpable        POPLITEAL  not palpable   not palpable       POSTERIOR TIBIAL  not palpable, no Doppler signall detected   not palpable, + Doppler signal        DORSALIS PEDIS      ANTERIOR TIBIAL not palpable, + Doppler signal  not palpable, +Doppler signal    Abdomen: soft, NT, no palpable masses. Skin: no rashes, see Extremities. Musculoskeletal: no muscle wasting  or atrophy.  Neurologic: A&O X 3; Appropriate Affect ; SENSATION: normal; MOTOR FUNCTION:  moving all extremities equally, motor strength 5/5 in upper extremities, 4/5 in LE's. Speech is fluent/normal.  CN 2-12 grossly intact.    Non-Invasive Vascular Imaging: DATE: 02/27/2016 none  ASSESSMENT: [name] is a 70 y.o. female who is status post transmetatarsal amputation on the left (4 surgeries from July 2010-January 2011) and fifth toe amputation on the right (05/27/2013). She is also s/p balloon angioplasty, right superficial femoral artery on 05/27/13, and left femoral to proximal anterior tibial artery bypass graft on 04/19/09.  A callus fell off the dorsum of her left foot while she was taking a shower in the last 1-2 days. This felt different, she viewed the dorsum of her foot with a mirror and saw a "hole" in her foot that was weeping watery blood tinged drainage.  Her concern re this new foot ulcer stems from slow and difficult healing of left foot transmetatarsal amputation.  No signs of infection (no erythema, no purulence, no swelling). No fever or chills.  She denies pain in her legs with walking. She reports labile and chronically uncontrolled DM.  Fortunately she has never used tobacco.   February 2017 bilateral lower extremity duplex revealed patency of her left femoral anterior bypass with no evidence of elevated velocities. Right leg reveals chronic high-grade stenosis in the superficial femoral artery which was unchanged from 6 months prior.   Pt states her left foot metatarsal amputation site healed with wet to dry dressings and use of Hydrogel. She is allergic to Keflex and Cipro.   PLAN:  Daily wet to dry dressing changes to left foot small ulcer, packing with edge of 2x2 gauze. Based on the patient's vascular studies and examination, and after discussing with Dr. Trula Slade, pt will return to clinic on 03/06/16 and see Dr. Donnetta Hutching for re-evaluation of left foot wound.  Will try to schedule bilateral LE arterial duplex that day before she sees Dr. Donnetta Hutching. Pt cannot tolerate ABI's, will obtain right TBI.  I advised pt to notify us should she develop fever or chills or signs of infection at this ulcer.   I discussed in depth with the patient the nature of atherosclerosis, and emphasized the importance of maximal medical management including strict control of blood pressure, blood glucose, and lipid levels, obtaining regular exercise, and continued cessation of smoking.  The patient is aware that without maximal medical management the underlying atherosclerotic disease process will progress, limiting the benefit of any interventions.  The patient was given information about PAD including signs, symptoms, treatment, what symptoms should prompt the patient to seek immediate medical care, and risk reduction measures to take.  Clemon Chambers, RN, MSN, FNP-C [facility] Phone: [phone]  Clinic MD: Trula Slade  02/27/2016 3:30 PM

## 2016-02-27 NOTE — Telephone Encounter (Signed)
Patient called requesting an appt with Dr. Donnetta Hutching regarding a new ulcer on her left foot. I called her back and let her know that Dr. Donnetta Hutching was not in the office this week.   She evidently had a callous under her left foot transmetatarsal amputation site; this "fell" off over the weekend. When she got a mirror and looked at the bottom of her foot, she noticed "A hole" in the plantar surface. She is a diabetic and has severe CHF. She reports no fever or chills and there is no odorous drainage coming for the hole. For these reasons, I will work her in this afternoon to see Vinnie Level at 2:15 pm.  Pt has wrapped her foot in clean gauze and will keep foot in off loading boot until her appt.

## 2016-02-28 ENCOUNTER — Encounter: Payer: Self-pay | Admitting: Vascular Surgery

## 2016-02-28 DIAGNOSIS — R809 Proteinuria, unspecified: Secondary | ICD-10-CM | POA: Diagnosis not present

## 2016-02-28 DIAGNOSIS — E039 Hypothyroidism, unspecified: Secondary | ICD-10-CM | POA: Diagnosis not present

## 2016-02-28 DIAGNOSIS — I10 Essential (primary) hypertension: Secondary | ICD-10-CM | POA: Diagnosis not present

## 2016-02-28 DIAGNOSIS — E109 Type 1 diabetes mellitus without complications: Secondary | ICD-10-CM | POA: Diagnosis not present

## 2016-03-05 ENCOUNTER — Other Ambulatory Visit: Payer: Self-pay | Admitting: *Deleted

## 2016-03-05 DIAGNOSIS — I739 Peripheral vascular disease, unspecified: Secondary | ICD-10-CM

## 2016-03-06 ENCOUNTER — Encounter: Payer: Self-pay | Admitting: Vascular Surgery

## 2016-03-06 ENCOUNTER — Ambulatory Visit (HOSPITAL_COMMUNITY)
Admission: RE | Admit: 2016-03-06 | Discharge: 2016-03-06 | Disposition: A | Discharge status: Home / Self Care | Payer: Medicare Other | Source: Ambulatory Visit | Attending: Vascular Surgery | Admitting: Vascular Surgery

## 2016-03-06 ENCOUNTER — Ambulatory Visit (INDEPENDENT_AMBULATORY_CARE_PROVIDER_SITE_OTHER): Payer: Medicare Other | Admitting: Vascular Surgery

## 2016-03-06 VITALS — BP 118/71 | HR 89 | Temp 96.6°F | Resp 16 | Ht 63.3 in | Wt 163.0 lb

## 2016-03-06 DIAGNOSIS — E785 Hyperlipidemia, unspecified: Secondary | ICD-10-CM | POA: Insufficient documentation

## 2016-03-06 DIAGNOSIS — I2510 Atherosclerotic heart disease of native coronary artery without angina pectoris: Secondary | ICD-10-CM | POA: Diagnosis not present

## 2016-03-06 DIAGNOSIS — K219 Gastro-esophageal reflux disease without esophagitis: Secondary | ICD-10-CM | POA: Diagnosis not present

## 2016-03-06 DIAGNOSIS — I779 Disorder of arteries and arterioles, unspecified: Secondary | ICD-10-CM

## 2016-03-06 DIAGNOSIS — I739 Peripheral vascular disease, unspecified: Secondary | ICD-10-CM | POA: Insufficient documentation

## 2016-03-06 DIAGNOSIS — I110 Hypertensive heart disease with heart failure: Secondary | ICD-10-CM | POA: Diagnosis not present

## 2016-03-06 DIAGNOSIS — E109 Type 1 diabetes mellitus without complications: Secondary | ICD-10-CM | POA: Insufficient documentation

## 2016-03-06 DIAGNOSIS — I509 Heart failure, unspecified: Secondary | ICD-10-CM | POA: Insufficient documentation

## 2016-03-06 DIAGNOSIS — L97529 Non-pressure chronic ulcer of other part of left foot with unspecified severity: Secondary | ICD-10-CM

## 2016-03-06 NOTE — Progress Notes (Signed)
[facility] [place]  Patient name: [name] MRN: [id number] DOB: 1946/01/30 Sex: female  REASON FOR VISIT: Follow-up left femoral anterior tibial bypass and evaluation of new plantar ulcer foot with the left metatarsal amputation  HPI: [name] is a 70 y.o. female an extensive past cardiac and peripheral arterial disease history. 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 She had been doing well. She reports approximate 10 days ago she had the opening of the ulceration on the plantar aspect of her foot over a prior large callus. She is seen today for further evaluation. She does have tenderness over this. She has been packing this area with saline and has been worked walking in a [employer].  Past Medical History  Diagnosis Date  . HTN (hypertension)   . Hypothyroidism   . History of recurrent UTIs   . CAD (coronary artery disease)     s/p CABG x3 in 1993; last cath in 2010  . Scleroderma ([place])   . Bilateral carpal tunnel syndrome 1970s  . PVD (peripheral vascular disease) ([place])     Toes amputated from left foot and has had prior bypass on left leg. Followed by Dr. Donnetta Hutching  . Claudication ([place])   . Aortic stenosis     0.8cm2 on echo in 10/2011  . Hyperlipidemia     on Lipitor  . Cat bite 2009    with MRSA; required I & D  . Heart murmur   . Blood transfusion     no reaction from transfusion; "when I had heart surgery" (08/28/2012)  . GERD (gastroesophageal reflux disease)   . CHF (congestive heart failure) ([place]) Aug. 2012    Echo 10/2011: Mild LVH, EF 30%, apex akinetic, septal HK, grade 2 diastolic dysfunction, or functionally bicuspid aortic valve, mild aortic stenosis by gradient, severe by calculated AVA-suspect A. Aortic stenosis probably moderate, trivial MR, mild LAE, mild RAE.   [name] Cellulitis   . Anemia   . B12 deficiency anemia     B 12 injection "monthly since  05/2012" (08/28/2012)  . Iron deficiency anemia 06/13/2012    "iron infusion" (08/28/2012  . Anginal pain ([place])     "history; not currently" (08/28/2012)  . Myocardial infarction [facility]) 1986    "silent"  . History of bronchitis     "I've had it twice in my life" (08/28/2012)  . Exertional dyspnea     "because of the heart failure" (08/28/2012)  . Type 1 diabetes ([place]) 10/1949  . Stomach ulcer 1981?    "on Tagament for ~ 1 yr" (08/28/2012)  . Seizure ([place])     ? seizure like event in 2010; workup included stress testing which led to repeat cath.   . Arthritis     "hands, hips, all over" (08/28/2012  . Osteoarthritis of both feet   . Breast cancer ([place])     left  . Cellulitis June 2013, Nov. 2013 and Feb. 14, 2014    Left leg    Family History  Problem Relation Age of Onset  . Diabetes Mother   . Heart attack Neg Hx   . Stroke Mother   . Stroke Father   . Hypertension Mother     SOCIAL HISTORY: Social History  Substance Use Topics  . Smoking status: Never Smoker   . Smokeless tobacco: Never Used  . Alcohol Use: No    Allergies  Allergen Reactions  .  Adhesive [Tape] Other (See Comments)    "old Johnson/Johnson adhesive tape; takes my skin off; can use paper tape"  . Zyvox [Linezolid]     Thrombocytopenia developed to 50k after several weeks of therapy  . Bactrim [Sulfamethoxazole-Trimethoprim] Other (See Comments)    Hyperkalemia and Increased creatinine  . Cephalexin Swelling and Rash  . Ciprofloxacin Swelling and Rash  . Daptomycin     Had elevated CPK on therapy, not clear that this was due to cubicin  . Vancomycin     ? If this played role in her Acute kidney injury    Current Outpatient Prescriptions  Medication Sig Dispense Refill  . aspirin EC 325 MG EC tablet Take 325 mg by mouth daily.     [name] atorvastatin (LIPITOR) 40 MG tablet TAKE 1 TABLET BY MOUTH AT BEDTIME 30 tablet 11  . Cyanocobalamin 1000 MCG/ML KIT Inject 1,000 mcg as directed every 8 (eight)  weeks.    . furosemide (LASIX) 20 MG tablet Alternate 3 pills (60 mg total) with 5 pills (100 mg total) every other day until next office visit.. 120 tablet 1  . insulin detemir (LEVEMIR) 100 UNIT/ML injection Inject 10-22 Units into the skin 2 (two) times daily. Inject 22 units subutaneously every morning (10am)  and 10 units at bedtime (10pm)    . insulin lispro (HUMALOG) 100 UNIT/ML injection Inject 1-12 Units into the skin 4 (four) times daily as needed for high blood sugar (sliding scale).     . isosorbide mononitrate (IMDUR) 30 MG 24 hr tablet Take 30 mg by mouth at bedtime.    [name] levothyroxine (SYNTHROID, LEVOTHROID) 100 MCG tablet Take 100 mcg by mouth daily before breakfast.   5   No current facility-administered medications for this visit.    REVIEW OF SYSTEMS:  '[X]'  denotes positive finding, '[ ]'  denotes negative finding Cardiac  Comments:  Chest pain or chest pressure:    Shortness of breath upon exertion: x   Short of breath when lying flat: x   Irregular heart rhythm:        Vascular    Pain in calf, thigh, or hip brought on by ambulation:    Pain in feet at night that wakes you up from your sleep:     Blood clot in your veins:    Leg swelling:  x       Pulmonary    Oxygen at home:    Productive cough:     Wheezing:         Neurologic    Sudden weakness in arms or legs:  x   Sudden numbness in arms or legs:     Sudden onset of difficulty speaking or slurred speech:    Temporary loss of vision in one eye:     Problems with dizziness:         Gastrointestinal    Blood in stool:     Vomited blood:         Genitourinary    Burning when urinating:     Blood in urine:        Psychiatric    Major depression:         Hematologic    Bleeding problems:    Problems with blood clotting too easily:        Skin    Rashes or ulcers:        Constitutional    Fever or chills:      PHYSICAL EXAM: Filed Vitals:  03/06/16 1449  BP: 118/71  Pulse: 89  Temp: 96.6  F (35.9 C)  Resp: 16  Height: 5' 3.3" (1.608 m)  Weight: 163 lb (73.936 kg)  SpO2: 99%    GENERAL: The patient is a well-nourished female, in no acute distress. The vital signs are documented above. VASCULAR: Easily palpable left lateral graft pulse and well-perfused foot. She does have chronic pitting edema. PULMONARY: There is good air exchange  MUSCULOSKELETAL: There are no major deformities or cyanosis. NEUROLOGIC: No focal weakness or paresthesias are detected. SKIN: Thick callus over the plantar aspect of her transmetatarsal amputation the lateral edge. A half centimeter punctate ulcer in the central portion of this without purulence or surrounding erythema PSYCHIATRIC: The patient has a normal affect.  DATA:  Noninvasive studies today revealed noncompressible vessels bilaterally. She does have a patent graft with normal graft flow throughout. She has known high-grade stenosis in her right superficial femoral artery  MEDICAL ISSUES: Concern regarding ulceration in her transmetatarsal amputation. She does have a patent bypass to this foot. I will refer her to Dr. Meridee Score for further evaluation. Concerned that this may represent osteomyelitis and may require additional surgical debridement. We will see her again in 6 months with repeat noninvasive studies and are always available for any vascular needs.    Curt Jews [facility] [employer] [phone]

## 2016-03-08 ENCOUNTER — Telehealth: Payer: Self-pay | Admitting: Vascular Surgery

## 2016-03-08 NOTE — Telephone Encounter (Signed)
Spoke with pt: Dr. Sharol Given appointment 03/15/16 12:30 pm. Pt verbalized understanding.

## 2016-03-15 ENCOUNTER — Encounter: Payer: Self-pay | Admitting: Internal Medicine

## 2016-03-15 DIAGNOSIS — I87323 Chronic venous hypertension (idiopathic) with inflammation of bilateral lower extremity: Secondary | ICD-10-CM | POA: Diagnosis not present

## 2016-03-15 DIAGNOSIS — M86272 Subacute osteomyelitis, left ankle and foot: Secondary | ICD-10-CM | POA: Diagnosis not present

## 2016-03-15 DIAGNOSIS — L97421 Non-pressure chronic ulcer of left heel and midfoot limited to breakdown of skin: Secondary | ICD-10-CM | POA: Diagnosis not present

## 2016-03-15 DIAGNOSIS — E1142 Type 2 diabetes mellitus with diabetic polyneuropathy: Secondary | ICD-10-CM | POA: Diagnosis not present

## 2016-03-16 ENCOUNTER — Ambulatory Visit (INDEPENDENT_AMBULATORY_CARE_PROVIDER_SITE_OTHER): Payer: Medicare Other | Admitting: Neurology

## 2016-03-16 ENCOUNTER — Encounter: Payer: Self-pay | Admitting: Neurology

## 2016-03-16 ENCOUNTER — Encounter: Payer: Self-pay | Admitting: Internal Medicine

## 2016-03-16 ENCOUNTER — Telehealth: Payer: Self-pay

## 2016-03-16 ENCOUNTER — Other Ambulatory Visit (HOSPITAL_COMMUNITY): Payer: Self-pay | Admitting: Family

## 2016-03-16 VITALS — BP 132/62 | HR 90 | Ht 63.3 in | Wt 170.0 lb

## 2016-03-16 DIAGNOSIS — R55 Syncope and collapse: Secondary | ICD-10-CM

## 2016-03-16 DIAGNOSIS — I779 Disorder of arteries and arterioles, unspecified: Secondary | ICD-10-CM | POA: Diagnosis not present

## 2016-03-16 NOTE — Telephone Encounter (Signed)
Called patient to clarify MyChart message and get more information (see full message below). The patient reports more difficulty breathing in the last couple months. She st it is a little worse than a month ago when she saw Dr. Harrington Challenger. She is concerned because Dr. Harrington Challenger never sees her out of breath. She st "I am always gasping for air when I get to the office. I'm taken back in a wheelchair and then am rested by the time Dr. Harrington Challenger sees me." She is not audibly SOB on the phone and st she is not SOB now. She reports she has a persistent nonproductive cough she attributes to CHF. The pt has complaints of dizziness. She was referred to Neuro and has an appointment today with Dr. Tomi Likens. She has a L foot infection that is new since OV with Dr. Harrington Challenger. She saw Dr. Donnetta Hutching and then Dr. Sharol Given who plans to amputate the area 6/7. The patient is concerned and wants to make sure "everyone is on the same page and that the surgery is cleared for CHF." She st she will be in the hospital for at least 2 days/nights.  Informed her Dr. Jess Barters office would be called to see if clearance is needed. She requests a call back with Dr. Harrington Challenger' recommendations for all issues.  Malachy Mood, Dr. Jess Barters surgery scheduler, st clearance was not requested or needed from Cardiology. She st the patient was not in distress yesterday. She was taken out in a wheelchair, but it was because the patient was worried she would lose her balance. She st she will call [facility] if anything is needed.  To Dr. Harrington Challenger for review.

## 2016-03-16 NOTE — Patient Instructions (Signed)
The events do sound like syncope (fainting).  I don't suspect seizures or brain tumor.  If it is related to anything neurologic, then it could be due to the diabetes affecting the nerves that regulate your blood pressure.  Ultimately, management is supportive care and taking measures to not faint.  If there is any change in your condition, please contact me and we can have you come back for re-evaluationg.

## 2016-03-16 NOTE — Telephone Encounter (Signed)
Follow Up  Is returning the call

## 2016-03-16 NOTE — Telephone Encounter (Signed)
Received MyChart message from patient today: Message     Dr. Harrington Challenger, I have several concerns that have been on my mind. Some urgency b/c of June 7 surgery.    CHF--I have increasing difficulty breathing, with a noticeable decline in the last 2 months. Syncope has caused activity curtailment, and I'm weaker. Neurological appt. June 2. Anything I can do to alleviate the shortness of breath? Medicine I could take? Still true that I don't need oxygen? Or is the CHF just naturally progressing?    COUGH--I have had a mostly non-productive cough for over a month. Hard to stop coughing, and I can't catch my breath. I feel nauseated sometimes from coughing. I assume this is associated with the CHF. If so, anything I can do?    NEW: FOOT INFECTION--I have a sore on left foot. Saw Curt Jews, then [employer] June 1. The bone is infected, requiring further minimal amputation on left foot. Surgery scheduled Wed., June 7 a.m. at [facility]--2-day stay. Any danger of anesthesia with CHF? Need to consult with Dr. Sharol Given about my condition? I am reachable at [phone].     Left message to call back.

## 2016-03-16 NOTE — Progress Notes (Signed)
NEUROLOGY CONSULTATION NOTE  [name] MRN: [id number] DOB: [birth year]  Referring provider: Dr. Harrington Challenger Primary care provider: No PCP  Reason for consult:  Syncope  HISTORY OF PRESENT ILLNESS: [name] is a 70 year old right-handed woman with CAD, aortic stenosis, CHF, type 2 diabetes, PVD, dyslipidemia, and CKD who presents for recurrent syncope.    She had 4 episodes of syncope between 01/10/16 and 01/29/16  They occurred following period of over-exertion or having just stood up.  She has CHF and tires and becomes out of breath easily.  She will suddenly feels lightheaded or a sensation of "out of body" experiencing for just a second and then she loses consciousness and collapses.  She is unconscious for no longer than a minute.  When she wakes up, she is not postictal.  There is no witnessed convulsions.  She denies incontinence or tongue biting.  During the last spell on 01/29/16, she was wearing a cardiac monitor at time of syncope, which did not reveal arrhythmia.  Heart rate was about 109.  Systolic blood pressure that morning was 98.  2D echo from 08/05/15 showed EF 25-30% with severe diffuse hypokinesis with distinct regional wall motion abnormalities with grade 2 diastolic dysfunction and moderate aortic stenosis.  BMP from 02/13/16 showed Na 136, K 4.8, glucose 166 and BUN 36 and Cr 1.60, which is her baseline.  She has longstanding history of uncontrolled diabetes.  Last Hgb A1c was 9.6.  PAST MEDICAL HISTORY: Past Medical History  Diagnosis Date  . HTN (hypertension)   . Hypothyroidism   . History of recurrent UTIs   . CAD (coronary artery disease)     s/p CABG x3 in 1993; last cath in 2010  . Scleroderma ([place])   . Bilateral carpal tunnel syndrome 1970s  . PVD (peripheral vascular disease) ([place])     Toes amputated from left foot and has had prior bypass on left leg. Followed by Dr. Donnetta Hutching  . Claudication ([place])   . Aortic stenosis     0.8cm2 on echo in  10/2011  . Hyperlipidemia     on Lipitor  . Cat bite 2009    with MRSA; required I & D  . Heart murmur   . Blood transfusion     no reaction from transfusion; "when I had heart surgery" (08/28/2012)  . GERD (gastroesophageal reflux disease)   . CHF (congestive heart failure) ([place]) Aug. 2012    Echo 10/2011: Mild LVH, EF 30%, apex akinetic, septal HK, grade 2 diastolic dysfunction, or functionally bicuspid aortic valve, mild aortic stenosis by gradient, severe by calculated AVA-suspect A. Aortic stenosis probably moderate, trivial MR, mild LAE, mild RAE.   [name] Cellulitis   . Anemia   . B12 deficiency anemia     B 12 injection "monthly since 05/2012" (08/28/2012)  . Iron deficiency anemia 06/13/2012    "iron infusion" (08/28/2012  . Anginal pain ([place])     "history; not currently" (08/28/2012)  . Myocardial infarction [facility]) 1986    "silent"  . History of bronchitis     "I've had it twice in my life" (08/28/2012)  . Exertional dyspnea     "because of the heart failure" (08/28/2012)  . Type 1 diabetes ([place]) 10/1949  . Stomach ulcer 1981?    "on Tagament for ~ 1 yr" (08/28/2012)  . Seizure ([place])     ? seizure like event in 2010; workup included stress testing which led to repeat cath.   . Arthritis     "  hands, hips, all over" (08/28/2012  . Osteoarthritis of both feet   . Breast cancer ([place])     left  . Cellulitis June 2013, Nov. 2013 and Feb. 14, 2014    Left leg    PAST SURGICAL HISTORY: Past Surgical History  Procedure Laterality Date  . Carpal tunnel release  ~ 1970's    bilaterally  . Mastectomy  11/1982    Left Breast  . Vitrectomy  1990's-2004    "both eyes; I've had total of 4"  . Tubal ligation  1984  . Tonsillectomy  1952    "?adenoids"   . Appendectomy  1991  . Femoral bypass      left leg "related to transmetatarsal amputation" (08/28/2012)  . Breast biopsy with sentinel lymph node biopsy and needle localization  1984  . Coronary artery bypass graft  1992    LIMA  to LAD, SVG to distal LCX & SVG to Marginal  . Cardiac catheterization  2010    LIMA to LAD patent yet with occluded distal LAD after graft insertion & retrograde is occluded. 3-4 septal perforators arise &are patent. SVG to a large marginal patent; SVG presumably to the distal dominant LCX is occluded, moderately high grade stenosis of the AV circumflex, but with distal stenoses involving a severely diseased marginal branch not amenable  to PCI  nor is inferior branch   . Cardiac catheterization  10/2011    "unsuccessful attempt of stenting" (08/28/2012)  . Cardiac catheterization  1992; 09/2011  . Breast biopsy    . Trigger finger release  1980's    right thumb  . Incision and drainage of wound  ~ 1967    "infection in left foot" (08/28/2012)  . Incision and drainage of wound  2009    "3 ORs on my right hand after cat bite" (08/28/2012)  . 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 Cataract extraction w/ intraocular lens  implant, bilateral  1990's  . Amputation Right 05/27/2013    Procedure: AMPUTATION DIGIT FIFTH TOE;  Surgeon: Rosetta Posner, MD;  Location: [facility];  Service: Vascular;  Laterality: Right;  . Left and right heart catheterization with coronary angiogram N/A 09/17/2011    Procedure: LEFT AND RIGHT HEART CATHETERIZATION WITH CORONARY ANGIOGRAM;  Surgeon: Hillary Bow, MD;  Location[facility] C[facility] LAB;  Service: Cardiovascular;  Laterality: N/A;  . Percutaneous coronary stent intervention (pci-s) N/A 11/15/2011    Procedure: PERCUTANEOUS CORONARY STENT INTERVENTION (PCI-S);  Surgeon: Hillary Bow, MD;  Location: [facility] CATH LAB;  Service: Cardiovascular;  Laterality: N/A;  . Lower extremity angiogram N/A 05/25/2013    Procedure: LOWER EXTREMITY ANGIOGRAM POSS INTERVENTION;  Surgeon: Conrad Burton, MD;  Location: [facility] CATH LAB;  Service: Cardiovascular;  Laterality: N/A;    MEDICATIONS: Current Outpatient Prescriptions on File Prior to Visit  Medication Sig  Dispense Refill  . aspirin EC 325 MG EC tablet Take 325 mg by mouth daily.     [name] atorvastatin (LIPITOR) 40 MG tablet TAKE 1 TABLET BY MOUTH AT BEDTIME 30 tablet 11  . Cyanocobalamin 1000 MCG/ML KIT Inject 1,000 mcg as directed every 8 (eight) weeks.    . furosemide (LASIX) 20 MG tablet Alternate 3 pills (60 mg total) with 5 pills (100 mg total) every other day until next office visit.. 120 tablet 1  . insulin detemir (LEVEMIR) 100 UNIT/ML injection Inject 10-22 Units into the skin 2 (two) times daily. Inject 22 units  subutaneously every morning (10am)  and 10 units at bedtime (10pm)    . insulin lispro (HUMALOG) 100 UNIT/ML injection Inject 1-12 Units into the skin 4 (four) times daily as needed for high blood sugar (sliding scale).     . isosorbide mononitrate (IMDUR) 30 MG 24 hr tablet Take 30 mg by mouth at bedtime.    [name] levothyroxine (SYNTHROID, LEVOTHROID) 100 MCG tablet Take 100 mcg by mouth daily before breakfast.   5   No current facility-administered medications on file prior to visit.    ALLERGIES: Allergies  Allergen Reactions  . Adhesive [Tape] Other (See Comments)    "old Johnson/Johnson adhesive tape; takes my skin off; can use paper tape"  . Zyvox [Linezolid]     Thrombocytopenia developed to 50k after several weeks of therapy  . Bactrim [Sulfamethoxazole-Trimethoprim] Other (See Comments)    Hyperkalemia and Increased creatinine  . Cephalexin Swelling and Rash  . Ciprofloxacin Swelling and Rash  . Daptomycin     Had elevated CPK on therapy, not clear that this was due to cubicin  . Vancomycin     ? If this played role in her Acute kidney injury    FAMILY HISTORY: Family History  Problem Relation Age of Onset  . Diabetes Mother   . Heart attack Neg Hx   . Stroke Mother   . Stroke Father   . Hypertension Mother     SOCIAL HISTORY: Social History   Social History  . Marital Status: Single    Spouse Name: N/A  . Number of Children: N/A  . Years of Education:  N/A   Occupational History  . Web designer    Social History Main Topics  . Smoking status: Never Smoker   . Smokeless tobacco: Never Used  . Alcohol Use: No  . Drug Use: No  . Sexual Activity: Not Currently    Birth Control/ Protection: Post-menopausal   Other Topics Concern  . Not on file   Social History Narrative    REVIEW OF SYSTEMS: Constitutional: No fevers, chills, or sweats, no generalized fatigue, change in appetite Eyes: decreased vision in right eye Ear, nose and throat: No hearing loss, ear pain, nasal congestion, sore throat Cardiovascular: No chest pain, palpitations Respiratory:  Shortness of breath on exertion GastrointestinaI: No nausea, vomiting, diarrhea, abdominal pain, fecal incontinence Genitourinary:  No dysuria, urinary retention or frequency Musculoskeletal:  No neck pain, back pain Integumentary: No rash, pruritus, skin lesions Neurological: as above Psychiatric: No depression, insomnia, anxiety Endocrine: No palpitations, fatigue, diaphoresis, mood swings, change in appetite, change in weight, increased thirst Hematologic/Lymphatic:  No purpura, petechiae. Allergic/Immunologic: no itchy/runny eyes, nasal congestion, recent allergic reactions, rashes  PHYSICAL EXAM: Filed Vitals:   03/16/16 1438  BP: 132/62  Pulse: 90   General: No acute distress.  Patient appears well-groomed.  Head:  Normocephalic/atraumatic Eyes:  fundi examined but not visualized Neck: supple, no paraspinal tenderness, full range of motion Back: No paraspinal tenderness Heart: regular rate and rhythm Lungs: Clear to auscultation bilaterally. Vascular: No carotid bruits. Neurological Exam: Mental status: alert and oriented to person, place, and time, recent and remote memory intact, fund of knowledge intact, attention and concentration intact, speech fluent and not dysarthric, language intact. Cranial nerves: CN I: not tested CN II: pupils equal, round and  reactive to light, decreased peripheral vision in right eye CN III, IV, VI:  full range of motion, no nystagmus, no ptosis CN V: facial sensation intact CN VII: upper and lower face  symmetric CN VIII: hearing intact CN IX, X: gag intact, uvula midline CN XI: sternocleidomastoid and trapezius muscles intact CN XII: tongue midline Bulk & Tone: normal, no fasciculations. Motor:  5/5 throughout  Sensation: temperature and vibration sensation decreased in feet. Deep Tendon Reflexes:  absent throughout,  toes downgoing. Finger to nose testing:  Without dysmetria.   Heel to shin:  Unable to assess Gait:  Ambulates with assistance and wearing left boot due to osteomyelitis.  IMPRESSION: Syncope.  It is triggered by exertion and, with cardiomyopathy, it is likely triggering a vasovagal or orthostatic event.  On at least one occasion, her blood pressure was running low that day.There could be possible underlying autonomic dysfunction related to her diabetes.  However, the semiology is classic for syncope and does not sound like seizure.    PLAN: Treatment would involve supportive management.  She must take care not to overexert herself.  If there is any change in her condition that would require re-evaluation, I urged her to contact us.  Thank you for allowing me to take part in the care of this patient.  Metta Clines, DO  CC:  Dorris Carnes, MD

## 2016-03-16 NOTE — Telephone Encounter (Signed)
I can see her Monday afternoon as add on.

## 2016-03-19 ENCOUNTER — Ambulatory Visit (INDEPENDENT_AMBULATORY_CARE_PROVIDER_SITE_OTHER): Payer: Medicare Other | Admitting: Internal Medicine

## 2016-03-19 ENCOUNTER — Encounter: Payer: Self-pay | Admitting: Internal Medicine

## 2016-03-19 VITALS — BP 146/75 | HR 93 | Ht 63.3 in | Wt 170.8 lb

## 2016-03-19 DIAGNOSIS — R05 Cough: Secondary | ICD-10-CM | POA: Diagnosis not present

## 2016-03-19 DIAGNOSIS — R42 Dizziness and giddiness: Secondary | ICD-10-CM

## 2016-03-19 DIAGNOSIS — R0602 Shortness of breath: Secondary | ICD-10-CM

## 2016-03-19 DIAGNOSIS — R059 Cough, unspecified: Secondary | ICD-10-CM

## 2016-03-19 DIAGNOSIS — I779 Disorder of arteries and arterioles, unspecified: Secondary | ICD-10-CM | POA: Diagnosis not present

## 2016-03-19 LAB — CBC
HEMATOCRIT: 41.9 % (ref 35.0–45.0)
Hemoglobin: 13.1 g/dL (ref 11.7–15.5)
MCH: 27.1 pg (ref 27.0–33.0)
MCHC: 31.3 g/dL — AB (ref 32.0–36.0)
MCV: 86.7 fL (ref 80.0–100.0)
MPV: 9.4 fL (ref 7.5–12.5)
Platelets: 193 10*3/uL (ref 140–400)
RBC: 4.83 MIL/uL (ref 3.80–5.10)
RDW: 15.3 % — AB (ref 11.0–15.0)
WBC: 6 10*3/uL (ref 3.8–10.8)

## 2016-03-19 LAB — BASIC METABOLIC PANEL
BUN: 39 mg/dL — AB (ref 7–25)
CHLORIDE: 100 mmol/L (ref 98–110)
CO2: 25 mmol/L (ref 20–31)
CREATININE: 1.53 mg/dL — AB (ref 0.60–0.93)
Calcium: 9.1 mg/dL (ref 8.6–10.4)
Glucose, Bld: 213 mg/dL — ABNORMAL HIGH (ref 65–99)
Potassium: 4.2 mmol/L (ref 3.5–5.3)
Sodium: 135 mmol/L (ref 135–146)

## 2016-03-19 LAB — SEDIMENTATION RATE: Sed Rate: 14 mm/hr (ref 0–30)

## 2016-03-19 NOTE — Patient Instructions (Signed)
Your physician recommends that you continue on your current medications as directed. Please refer to the Current Medication list given to you today. Your physician recommends that you return for lab work TODAY (CBC, BNP, BMET, SED RATE)

## 2016-03-19 NOTE — Telephone Encounter (Signed)
Dr Harrington Challenger can see pt today at 2:45PM.  LMTCB for pt to let her know Dr Harrington Challenger will see her today at 2:45PM.

## 2016-03-19 NOTE — Telephone Encounter (Signed)
Pt advised she has an appointment this afternoon at 2:45PM with Dr Harrington Challenger.

## 2016-03-20 ENCOUNTER — Encounter (HOSPITAL_COMMUNITY): Payer: Self-pay | Admitting: *Deleted

## 2016-03-20 LAB — BRAIN NATRIURETIC PEPTIDE: Brain Natriuretic Peptide: 767.1 pg/mL — ABNORMAL HIGH (ref <100)

## 2016-03-20 NOTE — Progress Notes (Signed)
Pt denies any acute cardiopulmonary issues. Pt is under the care of Dr. Harrington Challenger, Cardiology. Pt refuses to complete Apnea Screening in it's entirety. Pt refuses to receive instructions for diabetes protocol stating " I will adjust my own insulin !" Pt stated that coronary stent placement was unsuccessful when reviewing surgical history. Pt made aware  to taking vitamins, fish oil and herbal medications. Do not take any NSAIDs ie: Ibuprofen, Advil, Naproxen, BC and Goody Powder. Pt verbalized understanding of pre-op instructions given. Please see anesthesia note.

## 2016-03-20 NOTE — Progress Notes (Signed)
Anesthesia Chart Review: SAME DAY WORK-UP.  Patient is a 70 year old female scheduled for left TMA on 03/21/16 by Dr. Sharol Given. DX: Osteomyelitis.  History includes CAD/MI x/p CABG '92 (09/2011 cath: patent LIMA-LAD with occlusion of distal LAD, high grade stenosis of nondominant RCA, significant disease of proximal LCx, occluded SVG-L PDA, patent SVG to OM patent; not felt to be redo-CABG candidate; Medical thearpy), chronic systolic CHF, moderate AS, recurrent syncope, HLD, non-smoker, PVD s/p left Fem-AT BPG '10 and left TMA '11 and right fifth toe amputation '14, scleroderma, HTN, hypothyroidism, recurrent UTIs, CKD, anemia, DM1, exertional dyspnea, seizure like event '10, PUD '81, osteoarthritis, GERD, left breast cancer s/p mastectomy '84.    No PCP per patient. Had seen endocrinologist Dr. Chalmers Cater in the past, but unsure of when she was last seen. Neurologist is Dr. Metta Clines.   Cardiologist is Dr. Dorris Carnes, last visit on 03/19/16. She is aware of surgery plans and wrote, "From a cardiac standpoint she is at mod increased risk for periop event, particularly CHF Would recomm cautious hydration With close f/u of I/O and renal function (renal service has followed in the past). Alos close f/u of BP given AS and CAD . Unfort, nothing can be done to improve this risk. WIll get labs today Cardiology will be available in the perioperative period to follow."  01/16/16 EKG: SB at 57 bpm, rightward axis, septal infarct (age undetermined), ST/T wave abnormality, consider inferolateral infarct.  01/20/16-02/18/16 Event monitor: HR 58-127. SR, ST, ST/SR with PACs/fusion beats, SR/ST with PVCs, No afib, no tachy or brady arrhythmias.  08/05/15 Echo: Study Conclusions - Left ventricle: The cavity size was normal. Wall thickness was  normal. Systolic function was severely reduced. The estimated  ejection fraction was in the range of 25% to 30%. Severe diffuse  hypokinesis with distinct regional wall motion  abnormalities.  Dyskinesis and scarring of the inferior and inferoseptal  myocardium. Probable akinesis of the apical myocardium. Features  are consistent with a pseudonormal left ventricular filling  pattern, with concomitant abnormal relaxation and increased  filling pressure (grade 2 diastolic dysfunction). - Aortic valve: Severe diffuse calcification involving the  noncoronary cusp. Noncoronary cusp mobility was severely  restricted. Transvalvular velocity was increased less than  expected, due to low cardiac output. There was moderate stenosis.  There was trivial regurgitation. Valve area (VTI): 0.74 cm^2.  Valve area (Vmean): 0.66 cm^2. The gradients undestimate the true  sevrity of aortic stenosis, but the aortic stenosis appears  non-severe, based on the relatively preserved mobility of 2 of 3  cusps and the early peaking jet configuration. - Mitral valve: Calcified annulus. - Left atrium: The atrium was mildly dilated. - Right ventricle: Systolic function was moderately reduced.   09/17/11 Cardiac cath: Final Conclusions:  1. Elevated right heart pressures consistent with diastolic dysfunction. 2. Continued patency of the internal mammary artery to the distal left anterior descending artery with occlusion of the distal LAD as noted on previous studies. 3. High grade stenosis of a nondominant right coronary artery. 4. Progression of disease in the proximal dominant circumflex compared to the previous study of 2010. 5. Occlusion of the saphenous vein graft to the posterior descending branch which is derived from the circumflex vessel with high-grade stenosis of the circumflex portion of the PDA-this is chronic. 6. Continued patency of the saphenous vein graft to a obtuse marginal branch, similar to 2010. Recommendations: Admit the patient overnight for gentle hydration and reexamination of her renal function. She is  not a good candidate for redo surgery, particularly  revascularization as little of the accomplished at this point. I will review films with Dr. Harrington Challenger before making a final determination. Aortic stenosis is moderate, at least by cath does not appear to be critical. 11/15/11 Procedure:  1. Attempted PCI of native av circumflex artery with inability to cross with IVUS, rotational atherectomy, or balloon 2. Attempted IVUS of CFX pre intervention for interventional planning--no images obtained 3. Placement of temporary transvenous pacer. Conclusions: Unsuccessful attempt at PCI due to inability to cross Recommendations: Surgical consult. This is complex. She has prior CABG, severe diabetic arteriopathy, and despite a patent LIMA, severe distal disease. She has LV dysfunction, mild anemia, and CKD II. SHe also has diastolic heart failure, and moderately severe AS. The visual images of the echo do not suggest critical AS, hence given the bad distal disease, the attempt at PCI. We will await Dr. Synthia Innocent thoughts as there is not an easy solution here. (Dr. Servando Snare felt that with the degree of severe distal disease involving all the patient's targets she would be a very poor operative candidate for redo coronary artery bypass graft. In addition she has limited conduit with vein harvested completely from the right leg for the previous bypass and from the left leg for a femoral distal bypass.)  07/29/15 CXR: IMPRESSION: Mild congestive heart failure with small right pleural effusion. Right base airspace disease, favoring atelectasis. Concurrent infection cannot be excluded.  07/30/15 VQ scan: IMPRESSION: Very low probability for pulmonary embolus.  Labs on 03/19/16 noted. Cr 1.53, stable. Glucose 213. H/H 13.1/41.9. PLT 193K.   Patient was seen by cardiology yesterday and cleared with moderate risk. She is a same day work-up, so further evaluation on the day of surgery. With her severe LV dysfunction and abnormal CXR findings from 07/29/15, may want to consider  getting a pre-operative CXR (will defer to her anesthesiologist).   [facility]/Anesthesiology Phone [phone] 03/20/2016 6:21 PM

## 2016-03-20 NOTE — Progress Notes (Signed)
Cardiology Office Note   Date:  03/20/2016   ID:  NOHA MILBERGER, DOB 08/19/1946, MRN [id number]  PCP:  No PCP Per Patient  Cardiologist:   Dorris Carnes, MD   Pt here for f/u of CAD and CHF      History of Present Illness: [name] is a 70 y.o. female with a history of  CAD (s/p CABG- cath in 09/2011 showed patent LIMA to LAD with occlusion of distal LAD; High grade stenosis of nondominant RCA; Signifcant disease of prox LCx; SVG to L PDA occluded, SVG to OM patent), Aortic stenosis, DM, dyslipidemia, PVD and CKD, Echo Oct 2016 LVEF 25 to 30%, RVEF mod depressed; mod aortic stenosis. I saw the pt in May   Since May she has continued to have problems  She has had no further syncope  Event monitor showed no arrhythmia durring events while she wore device.   She denies CP but says she gives out very easily, gets SOB with activity.  Followed by T Early  Found to have osteo of L foot  Plan for ampuation later this weak by Dr Sharol Given.  Breahting is not back to baslined     Outpatient Prescriptions Prior to Visit  Medication Sig Dispense Refill  . aspirin EC 325 MG EC tablet Take 325 mg by mouth daily.     [name] atorvastatin (LIPITOR) 40 MG tablet TAKE 1 TABLET BY MOUTH AT BEDTIME 30 tablet 11  . Cyanocobalamin 1000 MCG/ML KIT Inject 1,000 mcg into the skin every 8 (eight) weeks.     . docusate sodium (COLACE) 50 MG capsule Take 50 mg by mouth at bedtime as needed (For constipation.).    [name] furosemide (LASIX) 20 MG tablet Alternate 3 pills (60 mg total) with 5 pills (100 mg total) every other day until next office visit.. (Patient taking differently: Take 60-100 mg by mouth every other day. Alternate 3 pills (60 mg total) with 5 pills (100 mg total) every other day until next office visit.[name]) 120 tablet 1  . insulin detemir (LEVEMIR) 100 UNIT/ML injection Inject 12-24 Units into the skin 2 (two) times daily. Inject 24 units subutaneously every morning (10am)  and 12 units at  bedtime (10pm)    . insulin lispro (HUMALOG) 100 UNIT/ML injection Inject 1-12 Units into the skin 4 (four) times daily as needed for high blood sugar (sliding scale).     . isosorbide mononitrate (IMDUR) 30 MG 24 hr tablet Take 30 mg by mouth at bedtime.    [name] levothyroxine (SYNTHROID, LEVOTHROID) 100 MCG tablet Take 100 mcg by mouth daily before breakfast.   5   No facility-administered medications prior to visit.     Allergies:   Zyvox; Adhesive; Bactrim; Cephalexin; Ciprofloxacin; Daptomycin; and Vancomycin   Past Medical History  Diagnosis Date  . HTN (hypertension)   . Hypothyroidism   . History of recurrent UTIs   . CAD (coronary artery disease)     s/p CABG x3 in 1993; last cath in 2010  . Scleroderma ([place])   . Bilateral carpal tunnel syndrome 1970s  . PVD (peripheral vascular disease) ([place])     Toes amputated from left foot and has had prior bypass on left leg. Followed by Dr. Donnetta Hutching  . Claudication ([place])   . Aortic stenosis     0.8cm2 on echo in 10/2011  . Hyperlipidemia     on Lipitor  . Cat bite 2009    with MRSA; required I & D  .  Heart murmur   . Blood transfusion     no reaction from transfusion; "when I had heart surgery" (08/28/2012)  . GERD (gastroesophageal reflux disease)   . CHF (congestive heart failure) ([place]) Aug. 2012    Echo 10/2011: Mild LVH, EF 30%, apex akinetic, septal HK, grade 2 diastolic dysfunction, or functionally bicuspid aortic valve, mild aortic stenosis by gradient, severe by calculated AVA-suspect A. Aortic stenosis probably moderate, trivial MR, mild LAE, mild RAE.   [name] Cellulitis   . Anemia   . B12 deficiency anemia     B 12 injection "monthly since 05/2012" (08/28/2012)  . Iron deficiency anemia 06/13/2012    "iron infusion" (08/28/2012  . Anginal pain ([place])     "history; not currently" (08/28/2012)  . Myocardial infarction [facility]) 1986    "silent"  . History of bronchitis     "I've had it twice in my life" (08/28/2012)  . Exertional  dyspnea     "because of the heart failure" (08/28/2012)  . Type 1 diabetes ([place]) 10/1949  . Stomach ulcer 1981?    "on Tagament for ~ 1 yr" (08/28/2012)  . Seizure ([place])     ? seizure like event in 2010; workup included stress testing which led to repeat cath.   . Arthritis     "hands, hips, all over" (08/28/2012  . Osteoarthritis of both feet   . Breast cancer ([place])     left  . Cellulitis June 2013, Nov. 2013 and Feb. 14, 2014    Left leg    Past Surgical History  Procedure Laterality Date  . Carpal tunnel release  ~ 1970's    bilaterally  . Mastectomy  11/1982    Left Breast  . Vitrectomy  1990's-2004    "both eyes; I've had total of 4"  . Tubal ligation  1984  . Tonsillectomy  1952    "?adenoids"   . Appendectomy  1991  . Femoral bypass      left leg "related to transmetatarsal amputation" (08/28/2012)  . Breast biopsy with sentinel lymph node biopsy and needle localization  1984  . Coronary artery bypass graft  1992    LIMA to LAD, SVG to distal LCX & SVG to Marginal  . Cardiac catheterization  2010    LIMA to LAD patent yet with occluded distal LAD after graft insertion & retrograde is occluded. 3-4 septal perforators arise &are patent. SVG to a large marginal patent; SVG presumably to the distal dominant LCX is occluded, moderately high grade stenosis of the AV circumflex, but with distal stenoses involving a severely diseased marginal branch not amenable  to PCI  nor is inferior branch   . Cardiac catheterization  10/2011    "unsuccessful attempt of stenting" (08/28/2012)  . Cardiac catheterization  1992; 09/2011  . Breast biopsy    . Trigger finger release  1980's    right thumb  . Incision and drainage of wound  ~ 1967    "infection in left foot" (08/28/2012)  . Incision and drainage of wound  2009    "3 ORs on my right hand after cat bite" (08/28/2012)  . 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 Cataract extraction w/ intraocular lens   implant, bilateral  1990's  . Amputation Right 05/27/2013    Procedure: AMPUTATION DIGIT FIFTH TOE;  Surgeon: Rosetta Posner, MD;  Location: [facility];  Service: Vascular;  Laterality: Right;  . Left and right heart catheterization  with coronary angiogram N/A 09/17/2011    Procedure: LEFT AND RIGHT HEART CATHETERIZATION WITH CORONARY ANGIOGRAM;  Surgeon: Hillary Bow, MD;  Location: [facility] CATH LAB;  Service: Cardiovascular;  Laterality: N/A;  . Percutaneous coronary stent intervention (pci-s) N/A 11/15/2011    Procedure: PERCUTANEOUS CORONARY STENT INTERVENTION (PCI-S);  Surgeon: Hillary Bow, MD;  Location[facility] C[facility] LAB;  Service: Cardiovascular;  Laterality: N/A;  . Lower extremity angiogram N/A 05/25/2013    Procedure: LOWER EXTREMITY ANGIOGRAM POSS INTERVENTION;  Surgeon: Conrad Boyes [place], MD;  Location: [facility] CATH LAB;  Service: Cardiovascular;  Laterality: N/A;     Social History:  The patient  reports that she has never smoked. She has never used smokeless tobacco. She reports that she does not drink alcohol or use illicit drugs.   Family History:  The patient's family history includes Diabetes in her mother; Hypertension in her mother; Stroke in her father and mother. There is no history of Heart attack.    ROS:  Please see the history of present illness. All other systems are reviewed and  Negative to the above problem except as noted.    PHYSICAL EXAM: VS:  BP 146/75 mmHg  Pulse 93  Ht 5' 3.3" (1.608 m)  Wt 170 lb 12.8 oz (77.474 kg)  BMI 29.96 kg/m2  SpO2 95%  GEN: Well nourished, well developed, in no acute distress HEENT: normal Neck: no JVD, Cardiac: RRR;  II/VI systolic murmur LSB , rubs, or gallops,Tr edema  Respiratory:  clear to auscultation bilaterally, normal work of breathing GI: soft, nontender, nondistended, + BS  No hepatomegaly  MS: s/p L transmetatarsal amputation on L  Foot in boot  And bandage Skin: warm and dry, no rash Neuro:  Strength and sensation are  intact Psych: euthymic mood, full affect   EKG:  EKG is not ordered today.   Lipid Panel    Component Value Date/Time   CHOL 136 12/02/2015 1522   TRIG 93 12/02/2015 1522   HDL 35* 12/02/2015 1522   CHOLHDL 3.9 12/02/2015 1522   VLDL 19 12/02/2015 1522   LDLCALC 82 12/02/2015 1522   LDLDIRECT 132.7 04/22/2007 0815      Wt Readings from Last 3 Encounters:  03/19/16 170 lb 12.8 oz (77.474 kg)  03/16/16 170 lb (77.111 kg)  03/06/16 163 lb (73.936 kg)      ASSESSMENT AND PLAN:  Pt is an unfortunate 70 yo with mutiple medical problems including severe CAD and chronic systolic CHF and mod AS  Now with osteomyelitis  Plan for surgery on Wed. From a cardiac standpoint she is at mod increased risk for periop event, particularly CHF  Would recomm cautious hydration  With close f/u of I/O and renal function (renal service has followed in the past).  Alos close f/u of BP given AS and CAD  . Unfort, nothing can be done to improve this risk  WIll get labs today  Cardiology will be available in the perioperative period to follow    1  CAD  Cont medical Rx  2.  Chronic systolic CHF  Vol status appear  mildly increased  Continue meds  3.  Mod AS      Labs/ tests ordered today include: Orders Placed This Encounter  Procedures  . Basic metabolic panel  . Brain natriuretic peptide  . CBC  . Sed Rate (ESR)    F/U later this summer    Signed, Dorris Carnes, MD  03/20/2016 1:20 PM    [facility] [address], [place], [place]  [address] Phone: [phone]; Fax: [phone]

## 2016-03-21 ENCOUNTER — Encounter: Payer: Self-pay | Admitting: *Deleted

## 2016-03-21 ENCOUNTER — Inpatient Hospital Stay (HOSPITAL_COMMUNITY)
Admission: RE | Admit: 2016-03-21 | Discharge: 2016-03-23 | DRG: 240 | Disposition: A | Discharge status: Rehabilitation Facility | Payer: Medicare Other | Source: Ambulatory Visit | Attending: Orthopedic Surgery | Admitting: Orthopedic Surgery

## 2016-03-21 ENCOUNTER — Encounter (HOSPITAL_COMMUNITY): Payer: Self-pay | Admitting: *Deleted

## 2016-03-21 ENCOUNTER — Inpatient Hospital Stay (HOSPITAL_COMMUNITY): Payer: Medicare Other | Admitting: Vascular Surgery

## 2016-03-21 ENCOUNTER — Encounter (HOSPITAL_COMMUNITY)
Admission: RE | Disposition: A | Discharge status: Rehabilitation Facility | Payer: Self-pay | Source: Ambulatory Visit | Attending: Orthopedic Surgery

## 2016-03-21 DIAGNOSIS — Z853 Personal history of malignant neoplasm of breast: Secondary | ICD-10-CM | POA: Diagnosis not present

## 2016-03-21 DIAGNOSIS — Z8249 Family history of ischemic heart disease and other diseases of the circulatory system: Secondary | ICD-10-CM

## 2016-03-21 DIAGNOSIS — Z91048 Other nonmedicinal substance allergy status: Secondary | ICD-10-CM

## 2016-03-21 DIAGNOSIS — E039 Hypothyroidism, unspecified: Secondary | ICD-10-CM | POA: Diagnosis present

## 2016-03-21 DIAGNOSIS — E1052 Type 1 diabetes mellitus with diabetic peripheral angiopathy with gangrene: Principal | ICD-10-CM | POA: Diagnosis present

## 2016-03-21 DIAGNOSIS — N189 Chronic kidney disease, unspecified: Secondary | ICD-10-CM | POA: Diagnosis present

## 2016-03-21 DIAGNOSIS — I5042 Chronic combined systolic (congestive) and diastolic (congestive) heart failure: Secondary | ICD-10-CM | POA: Diagnosis not present

## 2016-03-21 DIAGNOSIS — M869 Osteomyelitis, unspecified: Secondary | ICD-10-CM | POA: Diagnosis not present

## 2016-03-21 DIAGNOSIS — Z955 Presence of coronary angioplasty implant and graft: Secondary | ICD-10-CM | POA: Diagnosis not present

## 2016-03-21 DIAGNOSIS — I739 Peripheral vascular disease, unspecified: Secondary | ICD-10-CM | POA: Diagnosis not present

## 2016-03-21 DIAGNOSIS — Z794 Long term (current) use of insulin: Secondary | ICD-10-CM | POA: Diagnosis not present

## 2016-03-21 DIAGNOSIS — Z951 Presence of aortocoronary bypass graft: Secondary | ICD-10-CM | POA: Diagnosis not present

## 2016-03-21 DIAGNOSIS — M349 Systemic sclerosis, unspecified: Secondary | ICD-10-CM | POA: Diagnosis present

## 2016-03-21 DIAGNOSIS — I25812 Atherosclerosis of bypass graft of coronary artery of transplanted heart without angina pectoris: Secondary | ICD-10-CM | POA: Diagnosis not present

## 2016-03-21 DIAGNOSIS — I5022 Chronic systolic (congestive) heart failure: Secondary | ICD-10-CM | POA: Diagnosis not present

## 2016-03-21 DIAGNOSIS — Z881 Allergy status to other antibiotic agents status: Secondary | ICD-10-CM | POA: Diagnosis not present

## 2016-03-21 DIAGNOSIS — I10 Essential (primary) hypertension: Secondary | ICD-10-CM | POA: Diagnosis not present

## 2016-03-21 DIAGNOSIS — I2510 Atherosclerotic heart disease of native coronary artery without angina pectoris: Secondary | ICD-10-CM | POA: Diagnosis present

## 2016-03-21 DIAGNOSIS — K219 Gastro-esophageal reflux disease without esophagitis: Secondary | ICD-10-CM | POA: Diagnosis present

## 2016-03-21 DIAGNOSIS — E1069 Type 1 diabetes mellitus with other specified complication: Secondary | ICD-10-CM | POA: Diagnosis present

## 2016-03-21 DIAGNOSIS — I350 Nonrheumatic aortic (valve) stenosis: Secondary | ICD-10-CM | POA: Diagnosis not present

## 2016-03-21 DIAGNOSIS — D519 Vitamin B12 deficiency anemia, unspecified: Secondary | ICD-10-CM | POA: Diagnosis present

## 2016-03-21 DIAGNOSIS — E10319 Type 1 diabetes mellitus with unspecified diabetic retinopathy without macular edema: Secondary | ICD-10-CM | POA: Diagnosis present

## 2016-03-21 DIAGNOSIS — I252 Old myocardial infarction: Secondary | ICD-10-CM | POA: Diagnosis not present

## 2016-03-21 DIAGNOSIS — Z89432 Acquired absence of left foot: Secondary | ICD-10-CM | POA: Diagnosis not present

## 2016-03-21 DIAGNOSIS — Z883 Allergy status to other anti-infective agents status: Secondary | ICD-10-CM | POA: Diagnosis not present

## 2016-03-21 DIAGNOSIS — E1029 Type 1 diabetes mellitus with other diabetic kidney complication: Secondary | ICD-10-CM | POA: Diagnosis not present

## 2016-03-21 DIAGNOSIS — E785 Hyperlipidemia, unspecified: Secondary | ICD-10-CM | POA: Diagnosis present

## 2016-03-21 DIAGNOSIS — E1022 Type 1 diabetes mellitus with diabetic chronic kidney disease: Secondary | ICD-10-CM | POA: Diagnosis present

## 2016-03-21 DIAGNOSIS — Z87440 Personal history of urinary (tract) infections: Secondary | ICD-10-CM

## 2016-03-21 DIAGNOSIS — I509 Heart failure, unspecified: Secondary | ICD-10-CM | POA: Diagnosis not present

## 2016-03-21 DIAGNOSIS — I130 Hypertensive heart and chronic kidney disease with heart failure and stage 1 through stage 4 chronic kidney disease, or unspecified chronic kidney disease: Secondary | ICD-10-CM | POA: Diagnosis present

## 2016-03-21 HISTORY — DX: Osteomyelitis, unspecified: M86.9

## 2016-03-21 HISTORY — PX: AMPUTATION: SHX166

## 2016-03-21 LAB — GLUCOSE, CAPILLARY
GLUCOSE-CAPILLARY: 70 mg/dL (ref 65–99)
GLUCOSE-CAPILLARY: 105 mg/dL — AB (ref 65–99)
GLUCOSE-CAPILLARY: 111 mg/dL — AB (ref 65–99)
GLUCOSE-CAPILLARY: 170 mg/dL — AB (ref 65–99)
Glucose-Capillary: 134 mg/dL — ABNORMAL HIGH (ref 65–99)
Glucose-Capillary: 79 mg/dL (ref 65–99)

## 2016-03-21 LAB — SURGICAL PCR SCREEN
MRSA, PCR: POSITIVE — AB
Staphylococcus aureus: POSITIVE — AB

## 2016-03-21 SURGERY — AMPUTATION, FOOT, PARTIAL
Anesthesia: General | Laterality: Left

## 2016-03-21 MED ORDER — PROPOFOL 10 MG/ML IV BOLUS
INTRAVENOUS | Status: AC
  Filled 2016-03-21: qty 20

## 2016-03-21 MED ORDER — CLINDAMYCIN PHOSPHATE 900 MG/50ML IV SOLN
900.0000 mg | INTRAVENOUS | Status: AC
  Administered 2016-03-21: 900 mg via INTRAVENOUS
  Filled 2016-03-21: qty 50

## 2016-03-21 MED ORDER — MEPERIDINE HCL 25 MG/ML IJ SOLN: 6.2500 mg | INTRAMUSCULAR | Status: DC | PRN

## 2016-03-21 MED ORDER — ONDANSETRON HCL 4 MG PO TABS: 4.0000 mg | ORAL_TABLET | Freq: Four times a day (QID) | ORAL | Status: DC | PRN

## 2016-03-21 MED ORDER — ETOMIDATE 2 MG/ML IV SOLN
INTRAVENOUS | Status: AC
  Filled 2016-03-21: qty 10

## 2016-03-21 MED ORDER — INSULIN DETEMIR 100 UNIT/ML ~~LOC~~ SOLN
10.0000 [IU] | Freq: Every day | SUBCUTANEOUS | Status: DC
  Administered 2016-03-22: 10 [IU] via SUBCUTANEOUS
  Filled 2016-03-21 (×3): qty 0.1

## 2016-03-21 MED ORDER — DEXTROSE 50 % IV SOLN
25.0000 g | Freq: Once | INTRAVENOUS | Status: AC
  Administered 2016-03-21: 25 g via INTRAVENOUS

## 2016-03-21 MED ORDER — FUROSEMIDE 40 MG PO TABS
100.0000 mg | ORAL_TABLET | ORAL | Status: DC
  Administered 2016-03-23: 100 mg via ORAL
  Filled 2016-03-21: qty 2

## 2016-03-21 MED ORDER — OXYCODONE HCL 5 MG PO TABS
5.0000 mg | ORAL_TABLET | ORAL | Status: DC | PRN
Start: 2016-03-21 — End: 2016-03-23

## 2016-03-21 MED ORDER — SUCCINYLCHOLINE CHLORIDE 20 MG/ML IJ SOLN
INTRAMUSCULAR | Status: DC | PRN
  Administered 2016-03-21: 100 mg via INTRAVENOUS

## 2016-03-21 MED ORDER — LIDOCAINE 2% (20 MG/ML) 5 ML SYRINGE
INTRAMUSCULAR | Status: AC
  Filled 2016-03-21: qty 15

## 2016-03-21 MED ORDER — DEXTROSE 50 % IV SOLN
INTRAVENOUS | Status: AC
  Filled 2016-03-21: qty 50

## 2016-03-21 MED ORDER — DEXTROSE 50 % IV SOLN
INTRAVENOUS | Status: AC
  Administered 2016-03-21: 15 mL via INTRAVENOUS
  Filled 2016-03-21: qty 50

## 2016-03-21 MED ORDER — FENTANYL CITRATE (PF) 250 MCG/5ML IJ SOLN
INTRAMUSCULAR | Status: AC
  Filled 2016-03-21: qty 5

## 2016-03-21 MED ORDER — FUROSEMIDE 40 MG PO TABS
60.0000 mg | ORAL_TABLET | ORAL | Status: DC
  Administered 2016-03-22: 60 mg via ORAL
  Filled 2016-03-21 (×2): qty 1

## 2016-03-21 MED ORDER — MIDAZOLAM HCL 5 MG/5ML IJ SOLN
INTRAMUSCULAR | Status: DC | PRN
  Administered 2016-03-21 (×2): 1 mg via INTRAVENOUS

## 2016-03-21 MED ORDER — METOCLOPRAMIDE HCL 5 MG PO TABS: 5.0000 mg | ORAL_TABLET | Freq: Three times a day (TID) | ORAL | Status: DC | PRN

## 2016-03-21 MED ORDER — FENTANYL CITRATE (PF) 100 MCG/2ML IJ SOLN
INTRAMUSCULAR | Status: DC | PRN
  Administered 2016-03-21: 100 ug via INTRAVENOUS

## 2016-03-21 MED ORDER — NALOXONE HCL 0.4 MG/ML IJ SOLN
INTRAMUSCULAR | Status: DC | PRN
  Administered 2016-03-21: 80 ug via INTRAVENOUS

## 2016-03-21 MED ORDER — FLUMAZENIL 0.5 MG/5ML IV SOLN
INTRAVENOUS | Status: DC | PRN
  Administered 2016-03-21: 0.2 mg via INTRAVENOUS

## 2016-03-21 MED ORDER — FUROSEMIDE 20 MG PO TABS: 60.0000 mg | ORAL_TABLET | ORAL | Status: DC

## 2016-03-21 MED ORDER — MUPIROCIN 2 % EX OINT
1.0000 "application " | TOPICAL_OINTMENT | Freq: Once | CUTANEOUS | Status: AC
  Administered 2016-03-21: 1 via TOPICAL
  Filled 2016-03-21: qty 22

## 2016-03-21 MED ORDER — INSULIN ASPART 100 UNIT/ML ~~LOC~~ SOLN
6.0000 [IU] | Freq: Three times a day (TID) | SUBCUTANEOUS | Status: DC
  Administered 2016-03-23: 6 [IU] via SUBCUTANEOUS

## 2016-03-21 MED ORDER — MIDAZOLAM HCL 2 MG/2ML IJ SOLN: 0.5000 mg | Freq: Once | INTRAMUSCULAR | Status: DC | PRN

## 2016-03-21 MED ORDER — HYDROMORPHONE HCL 1 MG/ML IJ SOLN: 0.2500 mg | INTRAMUSCULAR | Status: DC | PRN

## 2016-03-21 MED ORDER — CLINDAMYCIN PHOSPHATE 600 MG/50ML IV SOLN
600.0000 mg | Freq: Four times a day (QID) | INTRAVENOUS | Status: AC
  Administered 2016-03-21 – 2016-03-22 (×3): 600 mg via INTRAVENOUS
  Filled 2016-03-21 (×3): qty 50

## 2016-03-21 MED ORDER — ONDANSETRON HCL 4 MG/2ML IJ SOLN
INTRAMUSCULAR | Status: AC
  Filled 2016-03-21: qty 2

## 2016-03-21 MED ORDER — LIDOCAINE HCL (CARDIAC) 20 MG/ML IV SOLN
INTRAVENOUS | Status: DC | PRN
  Administered 2016-03-21: 20 mg via INTRAVENOUS

## 2016-03-21 MED ORDER — HYDROMORPHONE HCL 1 MG/ML IJ SOLN: 1.0000 mg | INTRAMUSCULAR | Status: DC | PRN

## 2016-03-21 MED ORDER — ONDANSETRON HCL 4 MG/2ML IJ SOLN
INTRAMUSCULAR | Status: DC | PRN
  Administered 2016-03-21: 4 mg via INTRAVENOUS

## 2016-03-21 MED ORDER — CHLORHEXIDINE GLUCONATE 4 % EX LIQD: 60.0000 mL | Freq: Once | CUTANEOUS | Status: DC

## 2016-03-21 MED ORDER — INSULIN ASPART 100 UNIT/ML ~~LOC~~ SOLN
0.0000 [IU] | Freq: Three times a day (TID) | SUBCUTANEOUS | Status: DC
  Administered 2016-03-22 (×2): 4 [IU] via SUBCUTANEOUS

## 2016-03-21 MED ORDER — CHLORHEXIDINE GLUCONATE CLOTH 2 % EX PADS
6.0000 | MEDICATED_PAD | Freq: Every day | CUTANEOUS | Status: DC
  Administered 2016-03-22: 6 via TOPICAL

## 2016-03-21 MED ORDER — ASPIRIN EC 325 MG PO TBEC
325.0000 mg | DELAYED_RELEASE_TABLET | Freq: Every day | ORAL | Status: DC
  Administered 2016-03-21 – 2016-03-23 (×3): 325 mg via ORAL
  Filled 2016-03-21 (×3): qty 1

## 2016-03-21 MED ORDER — ACETAMINOPHEN 650 MG RE SUPP: 650.0000 mg | Freq: Four times a day (QID) | RECTAL | Status: DC | PRN

## 2016-03-21 MED ORDER — METOCLOPRAMIDE HCL 5 MG/ML IJ SOLN: 5.0000 mg | Freq: Three times a day (TID) | INTRAMUSCULAR | Status: DC | PRN

## 2016-03-21 MED ORDER — ACETAMINOPHEN 325 MG PO TABS
650.0000 mg | ORAL_TABLET | Freq: Four times a day (QID) | ORAL | Status: DC | PRN
  Administered 2016-03-23: 650 mg via ORAL
  Filled 2016-03-21: qty 2

## 2016-03-21 MED ORDER — PHENYLEPHRINE HCL 10 MG/ML IJ SOLN
10.0000 mg | INTRAVENOUS | Status: DC | PRN
  Administered 2016-03-21: 100 ug/min via INTRAVENOUS

## 2016-03-21 MED ORDER — PHENYLEPHRINE HCL 10 MG/ML IJ SOLN
INTRAMUSCULAR | Status: DC | PRN
  Administered 2016-03-21: 80 ug via INTRAVENOUS

## 2016-03-21 MED ORDER — SODIUM CHLORIDE 0.9 % IV SOLN
INTRAVENOUS | Status: DC
  Administered 2016-03-21: 16:00:00 via INTRAVENOUS

## 2016-03-21 MED ORDER — LEVOTHYROXINE SODIUM 100 MCG PO TABS
100.0000 ug | ORAL_TABLET | Freq: Every day | ORAL | Status: DC
  Administered 2016-03-22 – 2016-03-23 (×2): 100 ug via ORAL
  Filled 2016-03-21 (×2): qty 1

## 2016-03-21 MED ORDER — ETOMIDATE 2 MG/ML IV SOLN
INTRAVENOUS | Status: DC | PRN
  Administered 2016-03-21: 16 mg via INTRAVENOUS

## 2016-03-21 MED ORDER — MIDAZOLAM HCL 2 MG/2ML IJ SOLN
INTRAMUSCULAR | Status: AC
  Filled 2016-03-21: qty 2

## 2016-03-21 MED ORDER — METHOCARBAMOL 1000 MG/10ML IJ SOLN
500.0000 mg | Freq: Four times a day (QID) | INTRAVENOUS | Status: DC | PRN
  Filled 2016-03-21: qty 5

## 2016-03-21 MED ORDER — ISOSORBIDE MONONITRATE ER 30 MG PO TB24
30.0000 mg | ORAL_TABLET | Freq: Every day | ORAL | Status: DC
  Administered 2016-03-21 – 2016-03-22 (×2): 30 mg via ORAL
  Filled 2016-03-21 (×2): qty 1

## 2016-03-21 MED ORDER — ATORVASTATIN CALCIUM 40 MG PO TABS
40.0000 mg | ORAL_TABLET | Freq: Every day | ORAL | Status: DC
  Administered 2016-03-21 – 2016-03-22 (×2): 40 mg via ORAL
  Filled 2016-03-21 (×2): qty 1

## 2016-03-21 MED ORDER — PROMETHAZINE HCL 25 MG/ML IJ SOLN: 6.2500 mg | INTRAMUSCULAR | Status: DC | PRN

## 2016-03-21 MED ORDER — METHOCARBAMOL 500 MG PO TABS: 500.0000 mg | ORAL_TABLET | Freq: Four times a day (QID) | ORAL | Status: DC | PRN

## 2016-03-21 MED ORDER — ONDANSETRON HCL 4 MG/2ML IJ SOLN: 4.0000 mg | Freq: Four times a day (QID) | INTRAMUSCULAR | Status: DC | PRN

## 2016-03-21 MED ORDER — ARTIFICIAL TEARS OP OINT
TOPICAL_OINTMENT | OPHTHALMIC | Status: AC
  Filled 2016-03-21: qty 3.5

## 2016-03-21 MED ORDER — MUPIROCIN 2 % EX OINT
1.0000 "application " | TOPICAL_OINTMENT | Freq: Two times a day (BID) | CUTANEOUS | Status: DC
  Administered 2016-03-21 – 2016-03-23 (×4): 1 via NASAL
  Filled 2016-03-21: qty 22

## 2016-03-21 MED ORDER — LACTATED RINGERS IV SOLN
INTRAVENOUS | Status: DC
  Administered 2016-03-21: 09:00:00 via INTRAVENOUS

## 2016-03-21 SURGICAL SUPPLY — 31 items
BLADE SAW SGTL HD 18.5X60.5X1. (BLADE) ×2 IMPLANT
BLADE SURG 10 STRL SS (BLADE) IMPLANT
BNDG COHESIVE 4X5 TAN STRL (GAUZE/BANDAGES/DRESSINGS) ×3 IMPLANT
BNDG GAUZE ELAST 4 BULKY (GAUZE/BANDAGES/DRESSINGS) ×3 IMPLANT
COVER SURGICAL LIGHT HANDLE (MISCELLANEOUS) ×4 IMPLANT
DRAPE U-SHAPE 47X51 STRL (DRAPES) ×2 IMPLANT
DRSG ADAPTIC 3X8 NADH LF (GAUZE/BANDAGES/DRESSINGS) ×2 IMPLANT
DRSG PAD ABDOMINAL 8X10 ST (GAUZE/BANDAGES/DRESSINGS) ×2 IMPLANT
DURAPREP 26ML APPLICATOR (WOUND CARE) ×2 IMPLANT
ELECT REM PT RETURN 9FT ADLT (ELECTROSURGICAL) ×2
ELECTRODE REM PT RTRN 9FT ADLT (ELECTROSURGICAL) ×1 IMPLANT
GAUZE SPONGE 4X4 12PLY STRL (GAUZE/BANDAGES/DRESSINGS) ×2 IMPLANT
GLOVE BIOGEL PI IND STRL 9 (GLOVE) ×1 IMPLANT
GLOVE BIOGEL PI INDICATOR 9 (GLOVE) ×1
GLOVE SURG ORTHO 9.0 STRL STRW (GLOVE) ×2 IMPLANT
GOWN STRL REUS W/ TWL XL LVL3 (GOWN DISPOSABLE) ×3 IMPLANT
GOWN STRL REUS W/TWL XL LVL3 (GOWN DISPOSABLE) ×6
KIT BASIN OR (CUSTOM PROCEDURE TRAY) ×2 IMPLANT
KIT ROOM TURNOVER OR (KITS) ×2 IMPLANT
NS IRRIG 1000ML POUR BTL (IV SOLUTION) ×2 IMPLANT
PACK ORTHO EXTREMITY (CUSTOM PROCEDURE TRAY) ×2 IMPLANT
PAD ARMBOARD 7.5X6 YLW CONV (MISCELLANEOUS) ×4 IMPLANT
PADDING CAST COTTON 6X4 STRL (CAST SUPPLIES) ×1 IMPLANT
SPONGE LAP 18X18 X RAY DECT (DISPOSABLE) ×2 IMPLANT
SUT ETHILON 2 0 PSLX (SUTURE) ×6 IMPLANT
SUT VIC AB 2-0 CTB1 (SUTURE) IMPLANT
TOWEL OR 17X24 6PK STRL BLUE (TOWEL DISPOSABLE) ×2 IMPLANT
TOWEL OR 17X26 10 PK STRL BLUE (TOWEL DISPOSABLE) ×2 IMPLANT
TUBE CONNECTING 20X1/4 (TUBING) ×1 IMPLANT
WATER STERILE IRR 1000ML POUR (IV SOLUTION) ×2 IMPLANT
YANKAUER SUCT BULB TIP NO VENT (SUCTIONS) ×1 IMPLANT

## 2016-03-21 NOTE — Anesthesia Procedure Notes (Signed)
Procedure Name: Intubation Date/Time: 03/21/2016 10:35 AM Performed by: [name] Pre-anesthesia Checklist: Patient identified, Emergency Drugs available, Suction available, Patient being monitored and Timeout performed Patient Re-evaluated:Patient Re-evaluated prior to inductionOxygen Delivery Method: Circle system utilized Preoxygenation: Pre-oxygenation with 100% oxygen Intubation Type: IV induction and Rapid sequence Laryngoscope Size: Glidescope and 4 Grade View: Grade II Tube type: Oral Tube size: 7.0 mm Number of attempts: 1 Airway Equipment and Method: Video-laryngoscopy Placement Confirmation: ETT inserted through vocal cords under direct vision,  positive ETCO2 and breath sounds checked- equal and bilateral Secured at: 20 cm Tube secured with: Tape Dental Injury: Teeth and Oropharynx as per pre-operative assessment  Difficulty Due To: Difficulty was anticipated

## 2016-03-21 NOTE — Anesthesia Preprocedure Evaluation (Addendum)
Anesthesia Evaluation  Patient identified by MRN, date of birth, ID band Patient awake    Reviewed: Allergy & Precautions, NPO status , Patient's Chart, lab work & pertinent test results  History of Anesthesia Complications Negative for: history of anesthetic complications  Airway Mallampati: II   Neck ROM: Full    Dental  (+) Missing, Chipped, Dental Advisory Given   Pulmonary shortness of breath,    breath sounds clear to auscultation       Cardiovascular hypertension, Pt. on medications (-) angina+ CAD, + CABG and + Peripheral Vascular Disease   Rhythm:Regular Rate:Normal  '16 ECHO: EF 25% to 30%. Severe diffuse  hypokinesis with distinct regional wall motion abnormalities.Dyskinesis and scarring of the inferior and inferoseptal myocardium. Probable akinesis of the apical myocardium, valves OK   Neuro/Psych Anxiety negative neurological ROS     GI/Hepatic Neg liver ROS, GERD  Controlled,  Endo/Other  diabetes (glu 105), Insulin DependentHypothyroidism   Renal/GU Renal InsufficiencyRenal disease     Musculoskeletal  (+) Arthritis ,   Abdominal (+) + obese,   Peds  Hematology   Anesthesia Other Findings   Reproductive/Obstetrics                            Anesthesia Physical Anesthesia Plan  ASA: III  Anesthesia Plan: General   Post-op Pain Management:    Induction: Intravenous  Airway Management Planned: Oral ETT  Additional Equipment:   Intra-op Plan:   Post-operative Plan: Extubation in OR  Informed Consent: I have reviewed the patients History and Physical, chart, labs and discussed the procedure including the risks, benefits and alternatives for the proposed anesthesia with the patient or authorized representative who has indicated his/her understanding and acceptance.   Dental advisory given  Plan Discussed with: CRNA and Surgeon  Anesthesia Plan Comments: (Pt  refuses regional, plan routine monitors, GETA)        Anesthesia Quick Evaluation

## 2016-03-21 NOTE — H&P (Signed)
[name] is an 70 y.o. female.   Chief Complaint: Painful gangrenous changes left forefoot HPI: Patient is a 71 year old woman with diabetes peripheral vascular disease who is status post revascularization to the left lower extremity who has progressive gangrenous changes of the forefoot.  Past Medical History  Diagnosis Date  . HTN (hypertension)   . Hypothyroidism   . History of recurrent UTIs   . CAD (coronary artery disease)     s/p CABG x3 in 1993; last cath in 2010  . Scleroderma ([place])   . Bilateral carpal tunnel syndrome 1970s  . PVD (peripheral vascular disease) ([place])     Toes amputated from left foot and has had prior bypass on left leg. Followed by Dr. Donnetta Hutching  . Claudication ([place])   . Aortic stenosis     0.8cm2 on echo in 10/2011  . Hyperlipidemia     on Lipitor  . Cat bite 2009    with MRSA; required I & D  . Heart murmur   . Blood transfusion     no reaction from transfusion; "when I had heart surgery" (08/28/2012)  . GERD (gastroesophageal reflux disease)   . CHF (congestive heart failure) ([place]) Aug. 2012    Echo 10/2011: Mild LVH, EF 30%, apex akinetic, septal HK, grade 2 diastolic dysfunction, or functionally bicuspid aortic valve, mild aortic stenosis by gradient, severe by calculated AVA-suspect A. Aortic stenosis probably moderate, trivial MR, mild LAE, mild RAE.   [name] Cellulitis   . Anemia   . B12 deficiency anemia     B 12 injection "monthly since 05/2012" (08/28/2012)  . Iron deficiency anemia 06/13/2012    "iron infusion" (08/28/2012  . Anginal pain ([place])     "history; not currently" (08/28/2012)  . Myocardial infarction [facility]) 1986    "silent"  . History of bronchitis     "I've had it twice in my life" (08/28/2012)  . Exertional dyspnea     "because of the heart failure" (08/28/2012)  . Type 1 diabetes ([place]) 10/1949  . Stomach ulcer 1981?    "on Tagament for ~ 1 yr" (08/28/2012)  . Seizure ([place])     ? seizure like event in 2010; workup included  stress testing which led to repeat cath.   . Arthritis     "hands, hips, all over" (08/28/2012  . Osteoarthritis of both feet   . Breast cancer ([place])     left  . Cellulitis June 2013, Nov. 2013 and Feb. 14, 2014    Left leg  . Osteomyelitis ([place])     left foot    Past Surgical History  Procedure Laterality Date  . Carpal tunnel release  ~ 1970's    bilaterally  . Mastectomy  11/1982    Left Breast  . Vitrectomy  1990's-2004    "both eyes; I've had total of 4"  . Tubal ligation  1984  . Tonsillectomy  1952    "?adenoids"   . Appendectomy  1991  . Femoral bypass      left leg "related to transmetatarsal amputation" (08/28/2012)  . Breast biopsy with sentinel lymph node biopsy and needle localization  1984  . Coronary artery bypass graft  1992    LIMA to LAD, SVG to distal LCX & SVG to Marginal  . Cardiac catheterization  2010    LIMA to LAD patent yet with occluded distal LAD after graft insertion & retrograde is occluded. 3-4 septal perforators arise &are patent. SVG to a large marginal patent;  SVG presumably to the distal dominant LCX is occluded, moderately high grade stenosis of the AV circumflex, but with distal stenoses involving a severely diseased marginal branch not amenable  to PCI  nor is inferior branch   . Cardiac catheterization  10/2011    "unsuccessful attempt of stenting" (08/28/2012)  . Cardiac catheterization  1992; 09/2011  . Breast biopsy    . Trigger finger release  1980's    right thumb  . Incision and drainage of wound  ~ 1967    "infection in left foot" (08/28/2012)  . Incision and drainage of wound  2009    "3 ORs on my right hand after cat bite" (08/28/2012)  . 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 Cataract extraction w/ intraocular lens  implant, bilateral  1990's  . Amputation Right 05/27/2013    Procedure: AMPUTATION DIGIT FIFTH TOE;  Surgeon: Rosetta Posner, MD;  Location: [facility];  Service: Vascular;  Laterality:  Right;  . Left and right heart catheterization with coronary angiogram N/A 09/17/2011    Procedure: LEFT AND RIGHT HEART CATHETERIZATION WITH CORONARY ANGIOGRAM;  Surgeon: Hillary Bow, MD;  Location: [facility] CATH LAB;  Service: Cardiovascular;  Laterality: N/A;  . Percutaneous coronary stent intervention (pci-s) N/A 11/15/2011    Procedure: PERCUTANEOUS CORONARY STENT INTERVENTION (PCI-S);  Surgeon: Hillary Bow, MD;  Location: [facility] CATH LAB;  Service: Cardiovascular;  Laterality: N/A;  . Lower extremity angiogram N/A 05/25/2013    Procedure: LOWER EXTREMITY ANGIOGRAM POSS INTERVENTION;  Surgeon: Conrad Edinburg, MD;  Location: [facility] CATH LAB;  Service: Cardiovascular;  Laterality: N/A;    Family History  Problem Relation Age of Onset  . Diabetes Mother   . Heart attack Neg Hx   . Stroke Mother   . Stroke Father   . Hypertension Mother    Social History:  reports that she has never smoked. She has never used smokeless tobacco. She reports that she does not drink alcohol or use illicit drugs.  Allergies:  Allergies  Allergen Reactions  . Cephalexin Swelling and Rash    No specific area  . Ciprofloxacin Swelling and Rash    No specific area  . Zyvox [Linezolid] Other (See Comments)    Thrombocytopenia developed to 50k after several weeks of therapy  . Adhesive [Tape] Other (See Comments)    "old Johnson[employer]Johnson adhesive tape; takes my skin off; can use paper tape"  . Bactrim [Sulfamethoxazole-Trimethoprim] Other (See Comments)    Hyperkalemia and Increased creatinine  . Daptomycin Other (See Comments)    Had elevated CPK on therapy, not clear that this was due to cubicin  . Vancomycin Other (See Comments)    ? If this played role in her Acute kidney injury    No prescriptions prior to admission    Results for orders placed or performed in visit on 03/19/16 (from the past 48 hour(s))  Basic metabolic panel     Status: Abnormal   Collection Time: 03/19/16  3:49 PM  Result Value Ref  Range   Sodium 135 135 - 146 mmol/L   Potassium 4.2 3.5 - 5.3 mmol/L   Chloride 100 98 - 110 mmol/L   CO2 25 20 - 31 mmol/L   Glucose, Bld 213 (H) 65 - 99 mg/dL   BUN 39 (H) 7 - 25 mg/dL   Creat 1.53 (H) 0.60 - 0.93 mg/dL   Calcium 9.1 8.6 - 10.4 mg/dL  Brain natriuretic peptide  Status: Abnormal   Collection Time: 03/19/16  3:49 PM  Result Value Ref Range   Brain Natriuretic Peptide 767.1 (H) <100 pg/mL    Comment:   BNP levels increase with age in the general population with the highest values seen in individuals greater than 85 years of age. Reference: Joellyn Rued Cardiol 2002; 54:492-01.     CBC     Status: Abnormal   Collection Time: 03/19/16  3:49 PM  Result Value Ref Range   WBC 6.0 3.8 - 10.8 K/uL   RBC 4.83 3.80 - 5.10 MIL/uL   Hemoglobin 13.1 11.7 - 15.5 g/dL   HCT 41.9 35.0 - 45.0 %   MCV 86.7 80.0 - 100.0 fL   MCH 27.1 27.0 - 33.0 pg   MCHC 31.3 (L) 32.0 - 36.0 g/dL   RDW 15.3 (H) 11.0 - 15.0 %   Platelets 193 140 - 400 K/uL   MPV 9.4 7.5 - 12.5 fL    Comment: ** Please note change in unit of measure and reference range(s). **  Sed Rate (ESR)     Status: None   Collection Time: 03/19/16  3:49 PM  Result Value Ref Range   Sed Rate 14 0 - 30 mm/hr   No results found.  Review of Systems  All other systems reviewed and are negative.   There were no vitals taken for this visit. Physical Exam  On examination patient has gangrenous changes to the forefoot there is cellulitis ulceration and wound drainage. Assessment/Plan Assessment: Diabetic insensate neuropathy with peripheral vascular disease status post revascularization to left lower extremity with gangrenous changes of the forefoot.  Plan: Will attempt foot salvage intervention with a transmetatarsal amputation. Discussed with the patient she is increased risk of the wound not healing need for higher level amputation. Patient states she understands wish to proceed at this time.  Newt Minion,  MD 03/21/2016, 6:40 AM

## 2016-03-21 NOTE — Transfer of Care (Signed)
Immediate Anesthesia Transfer of Care Note  Patient: [name]  Procedure(s) Performed: Procedure(s): Left Transmetatarsal Amputation (Left)  Patient Location: PACU  Anesthesia Type:General  Level of Consciousness: sedated  Airway & Oxygen Therapy: Patient Spontanous Breathing and Patient connected to face mask oxygen  Post-op Assessment: Report given to RN and Post -op Vital signs reviewed and stable  Post vital signs: Reviewed and stable  Last Vitals:  Filed Vitals:   03/21/16 0853  BP: 163/57  Pulse: 86  Temp: 36.5 C  Resp: 18    Last Pain: There were no vitals filed for this visit.    Patients Stated Pain Goal: 5 ([id number] [id number])  Complications: No apparent anesthesia complications

## 2016-03-21 NOTE — Op Note (Signed)
03/21/2016  11:09 AM  PATIENT:  [name]    PRE-OPERATIVE DIAGNOSIS:  Osteomyelitis Left Foot  POST-OPERATIVE DIAGNOSIS:  Same  PROCEDURE:  Left Transmetatarsal Amputation  SURGEON:  Newt Minion, MD  PHYSICIAN ASSISTANT:None ANESTHESIA:   General  PREOPERATIVE INDICATIONS:  [name] is a  70 y.o. female with a diagnosis of Osteomyelitis Left Foot who failed conservative measures and elected for surgical management.    The risks benefits and alternatives were discussed with the patient preoperatively including but not limited to the risks of infection, bleeding, nerve injury, cardiopulmonary complications, the need for revision surgery, among others, and the patient was willing to proceed.  OPERATIVE IMPLANTS: None  OPERATIVE FINDINGS: Calcified vessels with minimal petechial bleeding  OPERATIVE PROCEDURE: Patient brought the operating room and underwent a general anesthetic. After adequate levels anesthesia obtained patient's left lower extremity was prepped using DuraPrep draped into a sterile field a timeout was called. A fishmouth incision was made around the ulcer a transmetatarsal amputation was performed the ulcer and metatarsals were resected in 1 block of tissue. Electrocautery was used for hemostasis wound was irrigated with normal saline. The wound was closed using 2-0 nylon. A sterile compressive dressing was applied from the tibial tubercle of to the amputated site. Patient had a proximal compressive wrap due to her venous insufficiency. Plan for discharge once  patient is stable from his pain standpoint

## 2016-03-21 NOTE — Progress Notes (Signed)
   Subjective: Pt breathing comfortably  No CP   Objective: Filed Vitals:   03/21/16 1355 03/21/16 1415 03/21/16 1435 03/21/16 1600  BP: 130/66 136/63 121/53   Pulse: 67 68 66   Temp:  97.9 F (36.6 C) 97.7 F (36.5 C)   TempSrc:      Resp: 16 15 18    Height:    5' 3.3" (1.608 m)  Weight:      SpO2: 100% 98% 100%    Weight change:   Intake/Output Summary (Last 24 hours) at 03/21/16 1825 Last data filed at 03/21/16 1500  Gross per 24 hour  Intake   1040 ml  Output     40 ml  Net   1000 ml    General: Alert, awake, oriented x3, in no acute distress Neck:  JVP is normal Heart: Regular rate and rhythm, Gr II/Vi systolic murmur at base  No rubs, gallops.  Lungs: Clear to auscultation.  No rales or wheezes. Exemities:  Tr edema R leg  L leg wrapped   Neuro: Grossly intact, nonfocal.   Lab Results: Results for orders placed or performed during the hospital encounter of 03/21/16 (from the past 24 hour(s))  Glucose, capillary     Status: None   Collection Time: 03/21/16  9:01 AM  Result Value Ref Range   Glucose-Capillary 70 65 - 99 mg/dL  Glucose, capillary     Status: Abnormal   Collection Time: 03/21/16  9:45 AM  Result Value Ref Range   Glucose-Capillary 111 (H) 65 - 99 mg/dL   Comment 1 Notify RN    Comment 2 Document in Chart   Surgical pcr screen     Status: Abnormal   Collection Time: 03/21/16  9:59 AM  Result Value Ref Range   MRSA, PCR POSITIVE (A) NEGATIVE   Staphylococcus aureus POSITIVE (A) NEGATIVE  Glucose, capillary     Status: Abnormal   Collection Time: 03/21/16 10:00 AM  Result Value Ref Range   Glucose-Capillary 105 (H) 65 - 99 mg/dL   Comment 1 Notify RN    Comment 2 Document in Chart   Glucose, capillary     Status: Abnormal   Collection Time: 03/21/16 11:30 AM  Result Value Ref Range   Glucose-Capillary 134 (H) 65 - 99 mg/dL  Glucose, capillary     Status: None   Collection Time: 03/21/16  4:37 PM  Result Value Ref Range   Glucose-Capillary 79 65 - 99 mg/dL   Comment 1 Notify RN     Studies/Results: No results found.  Medications: Reviewed     @PROBHOSP @  Pt s/p transmetatarsal amputation of foot for osteo   She is familiar to me from clinic  I saw her on Monday  1  Chronic systolic CHF LVEF 25 to [id number]  Volume status is not bad  Pt taking PO now  IV is at 10 cc/ hour She is  + some but overall looks good  I would try to avoid further IV fluids  Watch Cr   Currently on [place]  Back down as oxygen tolerates.  Sats have been 100   2.  CAD  Pt with severe CAD  (see my note from 6/6)  Currently without symptoms to sugg ischemia    3.  Mod AS    4  CKD  Baseline Cr around 1.5 to 1.8        LOS: 0 days   Dorris Carnes 03/21/2016, 6:25 PM

## 2016-03-21 NOTE — Anesthesia Postprocedure Evaluation (Signed)
Anesthesia Post Note  Patient: [name]  Procedure(s) Performed: Procedure(s) (LRB): Left Transmetatarsal Amputation (Left)  Patient location during evaluation: PACU Anesthesia Type: General Level of consciousness: awake and alert, oriented and patient cooperative Pain management: pain level controlled Vital Signs Assessment: post-procedure vital signs reviewed and stable Respiratory status: spontaneous breathing, nonlabored ventilation and respiratory function stable Cardiovascular status: blood pressure returned to baseline and stable Postop Assessment: no signs of nausea or vomiting Anesthetic complications: no    Last Vitals:  Filed Vitals:   03/21/16 1140 03/21/16 1155  BP: 156/74 119/76  Pulse: 80 73  Temp:    Resp: 15 18    Last Pain:  Filed Vitals:   03/21/16 1206  PainSc: 0-No pain                 Mohsin Crum,E. Corin Formisano

## 2016-03-21 NOTE — Progress Notes (Signed)
Orthopedic Tech Progress Note Patient Details:  [name] 21-Jan-1946 [id number] Visited pt. to fit for post-op shoe.  Pt. stated, "I already have one of those."     Darrol Poke 03/21/2016, 7:34 PM

## 2016-03-22 ENCOUNTER — Encounter (HOSPITAL_COMMUNITY): Payer: Self-pay | Admitting: Orthopedic Surgery

## 2016-03-22 ENCOUNTER — Telehealth: Payer: Self-pay | Admitting: Hematology and Oncology

## 2016-03-22 LAB — GLUCOSE, CAPILLARY
GLUCOSE-CAPILLARY: 199 mg/dL — AB (ref 65–99)
GLUCOSE-CAPILLARY: 154 mg/dL — AB (ref 65–99)
GLUCOSE-CAPILLARY: 220 mg/dL — AB (ref 65–99)
Glucose-Capillary: 90 mg/dL (ref 65–99)
Glucose-Capillary: 150 mg/dL — ABNORMAL HIGH (ref 65–99)
Glucose-Capillary: 187 mg/dL — ABNORMAL HIGH (ref 65–99)

## 2016-03-22 NOTE — Evaluation (Signed)
Physical Therapy Evaluation Patient Details Name: LAYLEE LIPS MRN: [id number] DOB: 19-Jun-1946 Today's Date: 03/22/2016   History of Present Illness  Patient is a 70 year old woman with diabetes peripheral vascular disease who is status post revascularization to the left lower extremity who has progressive gangrenous changes of the forefoot. Pt s/p transmetatarsal aputation of L foot (03/21/16) PMH inlcudes severe CAD, CABG, CHF with EF of 25-30%    Clinical Impression  Pt admitted with above diagnosis. Pt currently with functional limitations due to the deficits listed below (see PT Problem List). Pt will benefit from skilled PT to increase their independence and safety with mobility to allow discharge to the venue listed below.   Pt with complicated d/c environment.  She lives alone with 6 steps to enter and normally scoots around sitting on her rollator.  Pt only able to perform SPT at time of eval and would not attempt gait due to large demand NWB of LE puts on her UE and she reports it is too strenuous for her heart conditions.  Lengthy discussion with her on her d/c environment and safety with transfers and use of a RW for transfers and use of w/c rather than rollator.  She is currently refusing RW or w/c stating she can't propel with her arms cause it is too heavy. She has 6 steps to enter and she does not feel she has 2 people that could carry her up in a w/c, only 1 neighbor who can possibly assist her in.  At this point she would need non-emergency transport home and into house.    PT recommends SNF due to inconsistencies with her SPT and PT had to A her with donning pants, removing sock, etc.  She is refusing SNF at this time, therefore recommend 3-1 BSC, HHPT, [place] aides, ambulance transport home at this time.   Pt would benefit from OT consult and additional PT sessions while in acute care to work on safety and overall mobility.     Follow Up Recommendations Home health PT;Other  (comment);Supervision for mobility/OOB (Pt refusing SNF, recommend aides)    Equipment Recommendations  3in1 (PT);Other (comment) (pt refusing w/c or RW)    Recommendations for Other Services OT consult     Precautions / Restrictions Precautions Precautions: Fall Required Braces or Orthoses: Other Brace/Splint Other Brace/Splint: post op shoe Restrictions Weight Bearing Restrictions: Yes LLE Weight Bearing: Non weight bearing      Mobility  Bed Mobility               General bed mobility comments: Pt up in chair  Transfers Overall transfer level: Needs assistance   Transfers: Stand Pivot Transfers;Sit to/from Stand Sit to Stand: Min guard;Min assist Stand pivot transfers: Min guard;Min assist       General transfer comment: Stood from chair without arm rests to rollator and A pt to pull up her pants. SPT  chair > bed > rollator> recliner with close min/guard to occasional MIN depending on height of surface and heavy cueing.  Took multiple reps and increased time with each transfer to safely perform. Attempted to stand to RW and pt unable to.  Not always open to PT's suggestions.  Ambulation/Gait             General Gait Details: Pt refused ambulation attempts.  Only agreeable to SPT  Stairs            Wheelchair Mobility    Modified Rankin (Stroke Patients Only)  Balance Overall balance assessment: Needs assistance   Sitting balance-Leahy Scale: Good       Standing balance-Leahy Scale: Poor Standing balance comment: NWB L LE                             Pertinent Vitals/Pain Pain Assessment: No/denies pain    Home Living Family/patient expects to be discharged to:: Private residence Living Arrangements: Alone Available Help at Discharge: Neighbor;Available PRN/intermittently Type of Home: House Home Access: Stairs to enter Entrance Stairs-Rails: Right;Left Entrance Stairs-Number of Steps: 6 Home Layout: Two  level;Able to live on main level with bedroom/bathroom Home Equipment: Walker - 4 wheels;Shower seat - built in      Prior Function Level of Independence: Independent with assistive device(s)         Comments: I with rollator. States she normally just scoots around in her rollator while sitting.  "I know I'm not supposed to do that, but I do"     Hand Dominance        Extremity/Trunk Assessment   Upper Extremity Assessment: Generalized weakness           Lower Extremity Assessment: LLE deficits/detail;Generalized weakness   LLE Deficits / Details: L wrapping  Cervical / Trunk Assessment: Normal  Communication   Communication: No difficulties  Cognition Arousal/Alertness: Awake/alert Behavior During Therapy: WFL for tasks assessed/performed Overall Cognitive Status: Within Functional Limits for tasks assessed                      General Comments General comments (skin integrity, edema, etc.): Pt requires frequent rest breaks and states her difficulty is from her CHF and heart issues, not her foot.  o2 92-93% on RA.    Exercises        Assessment/Plan    PT Assessment Patient needs continued PT services  PT Diagnosis Difficulty walking;Generalized weakness   PT Problem List Decreased strength;Decreased activity tolerance;Decreased balance;Decreased mobility;Decreased knowledge of use of DME;Decreased knowledge of precautions;Cardiopulmonary status limiting activity  PT Treatment Interventions DME instruction;Gait training;Functional mobility training;Therapeutic activities;Therapeutic exercise;Stair training;Balance training   PT Goals (Current goals can be found in the Care Plan section) Acute Rehab PT Goals Patient Stated Goal: to go home and not rehab PT Goal Formulation: With patient Time For Goal Achievement: 03/29/16 Potential to Achieve Goals: Fair    Frequency Min 3X/week   Barriers to discharge Decreased caregiver support;Inaccessible home  environment 6 steps to enter and lives alone    Co-evaluation               End of Session Equipment Utilized During Treatment: Gait belt Activity Tolerance: Patient limited by fatigue Patient left: in chair;with call bell/phone within reach Nurse Communication: Mobility status;Other (comment) (request for OT consult)         Time: [phone] PT Time Calculation (min) (ACUTE ONLY): 45 min   Charges:   PT Evaluation $PT Eval Moderate Complexity: 1 Procedure PT Treatments $Therapeutic Activity: 23-37 mins   PT G Codes:        Akaysha Cobern LUBECK 03/22/2016, 11:19 AM

## 2016-03-22 NOTE — Progress Notes (Signed)
PT reported to RN this morning after session that pt was not safe to d/c home today, and would benefit from OT. Dr. Sharol Given was notified of this and agreeable. OT was ordered.   R[place], Jerry Caras

## 2016-03-22 NOTE — Discharge Summary (Signed)
Physician Discharge Summary  Patient ID: STACIE TEMPLIN MRN: [id number] DOB/AGE: Apr 29, 1946 70 y.o.  Admit date: 03/21/2016 Discharge date: 03/22/2016  Admission Diagnoses:Osteomyelitis ulceration left foot  Discharge Diagnoses:  Active Problems:   S/P transmetatarsal amputation of foot [facility])   Discharged Condition: stable  Hospital Course: Patient's hospital course was essentially unremarkable. She underwent left transmetatarsal amputation. Postoperatively she progressed well and was discharged to home in stable condition. Patient also had venous stasis insufficiency she was wrapped to control the venous insufficiency and follow-up in 1 week to change her compression wrap.  Consults: None  Significant Diagnostic Studies: labs: Routine labs  Treatments: surgery: See operative note  Discharge Exam: Blood pressure 106/48, pulse 77, temperature 97.5 F (36.4 C), temperature source Oral, resp. rate 19, height 5' 3.3" (1.608 m), weight 77.111 kg (170 lb), SpO2 98 %. Incision/Wound: Dressing clean and dry  Disposition: 01-Home or Self Care     Medication List    ASK your doctor about these medications        aspirin EC 325 MG tablet  Take 325 mg by mouth daily.     atorvastatin 40 MG tablet  Commonly known as:  LIPITOR  TAKE 1 TABLET BY MOUTH AT BEDTIME     Cyanocobalamin 1000 MCG/ML Kit  Inject 1,000 mcg into the skin every 8 (eight) weeks.     docusate sodium 50 MG capsule  Commonly known as:  COLACE  Take 50 mg by mouth at bedtime as needed (For constipation.).     furosemide 20 MG tablet  Commonly known as:  LASIX  Alternate 3 pills (60 mg total) with 5 pills (100 mg total) every other day until next office visit.[name]     insulin detemir 100 UNIT/ML injection  Commonly known as:  LEVEMIR  Inject 12-24 Units into the skin 2 (two) times daily. Inject 24 units subutaneously every morning (10am)  and 12 units at bedtime (10pm)     insulin lispro 100 UNIT/ML  injection  Commonly known as:  HUMALOG  Inject 1-12 Units into the skin 4 (four) times daily as needed for high blood sugar (sliding scale).     isosorbide mononitrate 30 MG 24 hr tablet  Commonly known as:  IMDUR  Take 30 mg by mouth at bedtime.     levothyroxine 100 MCG tablet  Commonly known as:  SYNTHROID, LEVOTHROID  Take 100 mcg by mouth daily before breakfast.           Follow-up Information    Follow up with Rhiana Morash V, MD In 1 week.   Specialty:  Orthopedic Surgery   Contact information:   [place] [address] [phone]       Signed: Newt Minion 03/22/2016, 6:54 AM

## 2016-03-22 NOTE — Telephone Encounter (Signed)
pt called to cx inj due to being inthe hospital....will call back to r/s

## 2016-03-23 ENCOUNTER — Encounter (HOSPITAL_COMMUNITY): Payer: Self-pay | Admitting: Physical Medicine and Rehabilitation

## 2016-03-23 ENCOUNTER — Inpatient Hospital Stay (HOSPITAL_COMMUNITY)
Admission: RE | Admit: 2016-03-23 | Discharge: 2016-03-27 | DRG: 560 | Disposition: A | Discharge status: Home Health | Payer: Medicare Other | Source: Intra-hospital | Attending: Physical Medicine & Rehabilitation | Admitting: Physical Medicine & Rehabilitation

## 2016-03-23 DIAGNOSIS — E1051 Type 1 diabetes mellitus with diabetic peripheral angiopathy without gangrene: Secondary | ICD-10-CM | POA: Diagnosis present

## 2016-03-23 DIAGNOSIS — Z881 Allergy status to other antibiotic agents status: Secondary | ICD-10-CM

## 2016-03-23 DIAGNOSIS — E1142 Type 2 diabetes mellitus with diabetic polyneuropathy: Secondary | ICD-10-CM | POA: Diagnosis not present

## 2016-03-23 DIAGNOSIS — I10 Essential (primary) hypertension: Secondary | ICD-10-CM | POA: Diagnosis not present

## 2016-03-23 DIAGNOSIS — Z9841 Cataract extraction status, right eye: Secondary | ICD-10-CM | POA: Diagnosis not present

## 2016-03-23 DIAGNOSIS — Z853 Personal history of malignant neoplasm of breast: Secondary | ICD-10-CM | POA: Diagnosis not present

## 2016-03-23 DIAGNOSIS — I739 Peripheral vascular disease, unspecified: Secondary | ICD-10-CM | POA: Diagnosis present

## 2016-03-23 DIAGNOSIS — I5022 Chronic systolic (congestive) heart failure: Secondary | ICD-10-CM | POA: Diagnosis present

## 2016-03-23 DIAGNOSIS — Z9842 Cataract extraction status, left eye: Secondary | ICD-10-CM | POA: Diagnosis not present

## 2016-03-23 DIAGNOSIS — Z951 Presence of aortocoronary bypass graft: Secondary | ICD-10-CM | POA: Diagnosis not present

## 2016-03-23 DIAGNOSIS — I252 Old myocardial infarction: Secondary | ICD-10-CM

## 2016-03-23 DIAGNOSIS — Z9010 Acquired absence of unspecified breast and nipple: Secondary | ICD-10-CM

## 2016-03-23 DIAGNOSIS — E1029 Type 1 diabetes mellitus with other diabetic kidney complication: Secondary | ICD-10-CM | POA: Diagnosis not present

## 2016-03-23 DIAGNOSIS — Z4781 Encounter for orthopedic aftercare following surgical amputation: Secondary | ICD-10-CM | POA: Diagnosis not present

## 2016-03-23 DIAGNOSIS — E10319 Type 1 diabetes mellitus with unspecified diabetic retinopathy without macular edema: Secondary | ICD-10-CM | POA: Diagnosis present

## 2016-03-23 DIAGNOSIS — Z888 Allergy status to other drugs, medicaments and biological substances status: Secondary | ICD-10-CM

## 2016-03-23 DIAGNOSIS — Z823 Family history of stroke: Secondary | ICD-10-CM | POA: Diagnosis not present

## 2016-03-23 DIAGNOSIS — I87323 Chronic venous hypertension (idiopathic) with inflammation of bilateral lower extremity: Secondary | ICD-10-CM | POA: Diagnosis not present

## 2016-03-23 DIAGNOSIS — K219 Gastro-esophageal reflux disease without esophagitis: Secondary | ICD-10-CM | POA: Diagnosis present

## 2016-03-23 DIAGNOSIS — Z79899 Other long term (current) drug therapy: Secondary | ICD-10-CM

## 2016-03-23 DIAGNOSIS — Z7982 Long term (current) use of aspirin: Secondary | ICD-10-CM

## 2016-03-23 DIAGNOSIS — D509 Iron deficiency anemia, unspecified: Secondary | ICD-10-CM | POA: Diagnosis present

## 2016-03-23 DIAGNOSIS — N189 Chronic kidney disease, unspecified: Secondary | ICD-10-CM | POA: Diagnosis present

## 2016-03-23 DIAGNOSIS — I2510 Atherosclerotic heart disease of native coronary artery without angina pectoris: Secondary | ICD-10-CM | POA: Diagnosis present

## 2016-03-23 DIAGNOSIS — I130 Hypertensive heart and chronic kidney disease with heart failure and stage 1 through stage 4 chronic kidney disease, or unspecified chronic kidney disease: Secondary | ICD-10-CM | POA: Diagnosis present

## 2016-03-23 DIAGNOSIS — Z961 Presence of intraocular lens: Secondary | ICD-10-CM | POA: Diagnosis present

## 2016-03-23 DIAGNOSIS — Z955 Presence of coronary angioplasty implant and graft: Secondary | ICD-10-CM | POA: Diagnosis not present

## 2016-03-23 DIAGNOSIS — E1022 Type 1 diabetes mellitus with diabetic chronic kidney disease: Secondary | ICD-10-CM | POA: Diagnosis present

## 2016-03-23 DIAGNOSIS — IMO0002 Reserved for concepts with insufficient information to code with codable children: Secondary | ICD-10-CM | POA: Diagnosis present

## 2016-03-23 DIAGNOSIS — Z882 Allergy status to sulfonamides status: Secondary | ICD-10-CM

## 2016-03-23 DIAGNOSIS — D510 Vitamin B12 deficiency anemia due to intrinsic factor deficiency: Secondary | ICD-10-CM | POA: Diagnosis present

## 2016-03-23 DIAGNOSIS — M79609 Pain in unspecified limb: Secondary | ICD-10-CM | POA: Diagnosis not present

## 2016-03-23 DIAGNOSIS — E039 Hypothyroidism, unspecified: Secondary | ICD-10-CM | POA: Diagnosis present

## 2016-03-23 DIAGNOSIS — Z794 Long term (current) use of insulin: Secondary | ICD-10-CM | POA: Diagnosis not present

## 2016-03-23 DIAGNOSIS — Z89432 Acquired absence of left foot: Secondary | ICD-10-CM

## 2016-03-23 DIAGNOSIS — R531 Weakness: Secondary | ICD-10-CM | POA: Diagnosis not present

## 2016-03-23 DIAGNOSIS — E1065 Type 1 diabetes mellitus with hyperglycemia: Secondary | ICD-10-CM

## 2016-03-23 DIAGNOSIS — M1991 Primary osteoarthritis, unspecified site: Secondary | ICD-10-CM | POA: Diagnosis present

## 2016-03-23 DIAGNOSIS — L97421 Non-pressure chronic ulcer of left heel and midfoot limited to breakdown of skin: Secondary | ICD-10-CM | POA: Diagnosis not present

## 2016-03-23 DIAGNOSIS — I5042 Chronic combined systolic (congestive) and diastolic (congestive) heart failure: Secondary | ICD-10-CM | POA: Diagnosis present

## 2016-03-23 DIAGNOSIS — M86272 Subacute osteomyelitis, left ankle and foot: Secondary | ICD-10-CM | POA: Diagnosis not present

## 2016-03-23 LAB — GLUCOSE, CAPILLARY
GLUCOSE-CAPILLARY: 208 mg/dL — AB (ref 65–99)
GLUCOSE-CAPILLARY: 172 mg/dL — AB (ref 65–99)
GLUCOSE-CAPILLARY: 266 mg/dL — AB (ref 65–99)
Glucose-Capillary: 102 mg/dL — ABNORMAL HIGH (ref 65–99)
Glucose-Capillary: 79 mg/dL (ref 65–99)

## 2016-03-23 LAB — COMPREHENSIVE METABOLIC PANEL
ALBUMIN: 3 g/dL — AB (ref 3.5–5.0)
ALK PHOS: 95 U/L (ref 38–126)
ALT: 12 U/L — AB (ref 14–54)
ANION GAP: 7 (ref 5–15)
AST: 18 U/L (ref 15–41)
BUN: 42 mg/dL — ABNORMAL HIGH (ref 6–20)
CHLORIDE: 97 mmol/L — AB (ref 101–111)
CO2: 25 mmol/L (ref 22–32)
Calcium: 9 mg/dL (ref 8.9–10.3)
Creatinine, Ser: 1.83 mg/dL — ABNORMAL HIGH (ref 0.44–1.00)
GFR, EST AFRICAN AMERICAN: 31 mL/min — AB (ref >60)
GFR, EST NON AFRICAN AMERICAN: 27 mL/min — AB (ref >60)
Glucose, Bld: 188 mg/dL — ABNORMAL HIGH (ref 65–99)
POTASSIUM: 4.5 mmol/L (ref 3.5–5.1)
SODIUM: 129 mmol/L — AB (ref 135–145)
TOTAL PROTEIN: 6.7 g/dL (ref 6.5–8.1)
Total Bilirubin: 0.9 mg/dL (ref 0.3–1.2)

## 2016-03-23 MED ORDER — BISACODYL 10 MG RE SUPP
10.0000 mg | Freq: Every day | RECTAL | Status: DC | PRN
Start: 2016-03-23 — End: 2016-03-27

## 2016-03-23 MED ORDER — LEVOTHYROXINE SODIUM 100 MCG PO TABS
100.0000 ug | ORAL_TABLET | Freq: Every day | ORAL | Status: DC
  Administered 2016-03-24 – 2016-03-27 (×4): 100 ug via ORAL
  Filled 2016-03-23 (×4): qty 1

## 2016-03-23 MED ORDER — ASPIRIN EC 325 MG PO TBEC
325.0000 mg | DELAYED_RELEASE_TABLET | Freq: Every day | ORAL | Status: DC
  Administered 2016-03-24 – 2016-03-27 (×4): 325 mg via ORAL
  Filled 2016-03-23 (×4): qty 1

## 2016-03-23 MED ORDER — OXYCODONE HCL 5 MG PO TABS: 5.0000 mg | ORAL_TABLET | ORAL | Status: DC | PRN

## 2016-03-23 MED ORDER — DOCUSATE SODIUM 50 MG/5ML PO LIQD
50.0000 mg | Freq: Every day | ORAL | Status: DC | PRN
  Filled 2016-03-23: qty 10

## 2016-03-23 MED ORDER — ATORVASTATIN CALCIUM 40 MG PO TABS
40.0000 mg | ORAL_TABLET | Freq: Every day | ORAL | Status: DC
  Administered 2016-03-23 – 2016-03-26 (×4): 40 mg via ORAL
  Filled 2016-03-23 (×4): qty 1

## 2016-03-23 MED ORDER — INSULIN ASPART 100 UNIT/ML ~~LOC~~ SOLN
3.0000 [IU] | Freq: Three times a day (TID) | SUBCUTANEOUS | Status: DC
  Administered 2016-03-24: 2 [IU] via SUBCUTANEOUS
  Administered 2016-03-25 – 2016-03-26 (×3): 3 [IU] via SUBCUTANEOUS

## 2016-03-23 MED ORDER — TRAZODONE HCL 50 MG PO TABS
25.0000 mg | ORAL_TABLET | Freq: Every evening | ORAL | Status: DC | PRN
  Administered 2016-03-24: 50 mg via ORAL
  Filled 2016-03-23: qty 1

## 2016-03-23 MED ORDER — GUAIFENESIN-DM 100-10 MG/5ML PO SYRP
5.0000 mL | ORAL_SOLUTION | Freq: Four times a day (QID) | ORAL | Status: DC | PRN
Start: 2016-03-23 — End: 2016-03-27

## 2016-03-23 MED ORDER — ACETAMINOPHEN 325 MG PO TABS
325.0000 mg | ORAL_TABLET | ORAL | Status: DC | PRN
  Administered 2016-03-26 – 2016-03-27 (×2): 650 mg via ORAL
  Filled 2016-03-23 (×2): qty 2

## 2016-03-23 MED ORDER — METHOCARBAMOL 500 MG PO TABS: 500.0000 mg | ORAL_TABLET | Freq: Four times a day (QID) | ORAL | Status: DC | PRN

## 2016-03-23 MED ORDER — SUCRALFATE 1 GM/10ML PO SUSP
1.0000 g | Freq: Three times a day (TID) | ORAL | Status: DC
  Filled 2016-03-23: qty 10

## 2016-03-23 MED ORDER — CHLORHEXIDINE GLUCONATE CLOTH 2 % EX PADS
6.0000 | MEDICATED_PAD | Freq: Every day | CUTANEOUS | Status: AC
  Administered 2016-03-24 – 2016-03-26 (×2): 6 via TOPICAL

## 2016-03-23 MED ORDER — SUCRALFATE 1 GM/10ML PO SUSP: 1.0000 g | Freq: Three times a day (TID) | ORAL | Status: DC

## 2016-03-23 MED ORDER — FLEET ENEMA 7-19 GM/118ML RE ENEM: 1.0000 | ENEMA | Freq: Once | RECTAL | Status: DC | PRN

## 2016-03-23 MED ORDER — FUROSEMIDE 40 MG PO TABS: 100.0000 mg | ORAL_TABLET | ORAL | Status: DC

## 2016-03-23 MED ORDER — ENOXAPARIN SODIUM 40 MG/0.4ML ~~LOC~~ SOLN
40.0000 mg | SUBCUTANEOUS | Status: DC
  Filled 2016-03-23 (×3): qty 0.4

## 2016-03-23 MED ORDER — INSULIN DETEMIR 100 UNIT/ML ~~LOC~~ SOLN
10.0000 [IU] | Freq: Two times a day (BID) | SUBCUTANEOUS | Status: DC
  Administered 2016-03-23 – 2016-03-24 (×2): 10 [IU] via SUBCUTANEOUS
  Filled 2016-03-23 (×4): qty 0.1

## 2016-03-23 MED ORDER — MUPIROCIN 2 % EX OINT
1.0000 "application " | TOPICAL_OINTMENT | Freq: Two times a day (BID) | CUTANEOUS | Status: AC
  Administered 2016-03-23 – 2016-03-26 (×6): 1 via NASAL

## 2016-03-23 MED ORDER — INSULIN ASPART 100 UNIT/ML ~~LOC~~ SOLN
0.0000 [IU] | Freq: Three times a day (TID) | SUBCUTANEOUS | Status: DC
  Administered 2016-03-24: 3 [IU] via SUBCUTANEOUS

## 2016-03-23 MED ORDER — SENNOSIDES-DOCUSATE SODIUM 8.6-50 MG PO TABS
1.0000 | ORAL_TABLET | Freq: Every evening | ORAL | Status: DC | PRN
  Filled 2016-03-23: qty 1

## 2016-03-23 MED ORDER — ISOSORBIDE MONONITRATE ER 30 MG PO TB24
30.0000 mg | ORAL_TABLET | Freq: Every day | ORAL | Status: DC
  Administered 2016-03-23 – 2016-03-26 (×4): 30 mg via ORAL
  Filled 2016-03-23 (×4): qty 1

## 2016-03-23 MED ORDER — ONDANSETRON HCL 4 MG PO TABS: 4.0000 mg | ORAL_TABLET | Freq: Four times a day (QID) | ORAL | Status: DC | PRN

## 2016-03-23 MED ORDER — FUROSEMIDE 40 MG PO TABS
60.0000 mg | ORAL_TABLET | ORAL | Status: DC
  Administered 2016-03-24: 60 mg via ORAL
  Filled 2016-03-23: qty 1

## 2016-03-23 MED ORDER — ALUM & MAG HYDROXIDE-SIMETH 200-200-20 MG/5ML PO SUSP
30.0000 mL | ORAL | Status: DC | PRN
  Filled 2016-03-23: qty 30

## 2016-03-23 MED ORDER — HYDROXYZINE HCL 10 MG PO TABS: 10.0000 mg | ORAL_TABLET | Freq: Three times a day (TID) | ORAL | Status: AC | PRN

## 2016-03-23 MED ORDER — ONDANSETRON HCL 4 MG/2ML IJ SOLN: 4.0000 mg | Freq: Four times a day (QID) | INTRAMUSCULAR | Status: DC | PRN

## 2016-03-23 NOTE — PMR Pre-admission (Signed)
PMR Admission Coordinator Pre-Admission Assessment  Patient: [name] is an 70 y.o., female MRN: [id number] DOB: June 18, 1946 Height: 5' 3.3" (160.8 cm) Weight: 77.111 kg (170 lb)              Insurance Information HMO:     PPO:      PCP:      IPA:      80/20: yes     OTHER: no hmo PRIMARY: Medicare a and b      Policy#: [id number]      Subscriber: pt Benefits:  Phone #: online     Name: 03/23/16 Eff. Date: 06/15/2010     Deduct: $1316      Out of Pocket Max: none      Life Max: none CIR: 100%      SNF: 20 full days Outpatient: 80%     Co-Pay: 20% Home Health: 100%      Co-Pay: none DME: 80     Co-Pay: 20% Providers: pt choice  SECONDARY: none       Medicaid Application Date:       Case Manager:  Disability Application Date:       Case Worker:   Emergency Contact Information Contact Information    Name Relation Home Work [place] [phone]      Current Medical History  Patient Admitting Diagnosis: PAD s/p left TMA  History of Present Illness: [name] is a 70 y.o. right handed female with history of hypertension, type 1 diabetes mellitus, CAD with CABG, systolic congestive heart failure, chronic renal insufficiency with baseline creatinine 1.5-1.8, PVD with multiple revascularization procedures as well as history of left transmetatarsal amputation 2013 by Dr. Donnetta Hutching of which at that time she was discharged to [facility] skilled nursing facility. Patient presently lives alone used a rolllator walker prior to admission and still driving.  Presented 03/21/2016 with ischemic gangrenous changes left forefoot with plantar ulcer and findings of osteomyelitis. Limb was not felt to be salvageable and underwent left transmetatarsal amputation/revision 03/21/2016 per Dr. Sharol Given.NWB LLE. Hospital course pain management. Follow-up cardiology services to monitor history of chronic systolic congestive heart failure. MRSA PCR screen positive  remained on contact precautions.   Past Medical History  Past Medical History  Diagnosis Date  . HTN (hypertension)   . Hypothyroidism   . History of recurrent UTIs   . CAD (coronary artery disease)     s/p CABG x3 in 1993; last cath in 2010  . Scleroderma ([place])   . Bilateral carpal tunnel syndrome 1970s  . PVD (peripheral vascular disease) ([place])     Toes amputated from left foot and has had prior bypass on left leg. Followed by Dr. Donnetta Hutching  . Claudication ([place])   . Aortic stenosis     0.8cm2 on echo in 10/2011  . Hyperlipidemia     on Lipitor  . Cat bite 2009    with MRSA; required I & D  . Heart murmur   . Blood transfusion     no reaction from transfusion; "when I had heart surgery" (08/28/2012)  . GERD (gastroesophageal reflux disease)   . CHF (congestive heart failure) ([place]) Aug. 2012    Echo 10/2011: Mild LVH, EF 30%, apex akinetic, septal HK, grade 2 diastolic dysfunction, or functionally bicuspid aortic valve, mild aortic stenosis by gradient, severe by calculated AVA-suspect A. Aortic stenosis probably moderate, trivial MR, mild LAE, mild RAE.   [name] Cellulitis   . Anemia   .  B12 deficiency anemia     B 12 injection "monthly since 05/2012" (08/28/2012)  . Iron deficiency anemia 06/13/2012    "iron infusion" (08/28/2012  . Anginal pain ([place])     "history; not currently" (08/28/2012)  . Myocardial infarction [facility]) 1986    "silent"  . History of bronchitis     "I've had it twice in my life" (08/28/2012)  . Exertional dyspnea     "because of the heart failure" (08/28/2012)  . Type 1 diabetes ([place]) 10/1949  . Stomach ulcer 1981?    "on Tagament for ~ 1 yr" (08/28/2012)  . Seizure ([place])     ? seizure like event in 2010; workup included stress testing which led to repeat cath.   . Arthritis     "hands, hips, all over" (08/28/2012  . Osteoarthritis of both feet   . Breast cancer ([place])     left  . Cellulitis June 2013, Nov. 2013 and Feb. 14, 2014    Left leg  .  Osteomyelitis ([place])     left foot    Family History  family history includes Diabetes in her mother; Hypertension in her mother; Stroke in her father and mother. There is no history of Heart attack.  Prior Rehab/Hospitalizations:  Has the patient had major surgery during 100 days prior to admission? No  Current Medications   Current facility-administered medications:  .  0.9 %  sodium chloride infusion, , Intravenous, Continuous, Newt Minion, MD, Last Rate: 10 mL/hr at 03/21/16 1618 .  acetaminophen (TYLENOL) tablet 650 mg, 650 mg, Oral, Q6H PRN, 650 mg at 03/23/16 0312 **OR** acetaminophen (TYLENOL) suppository 650 mg, 650 mg, Rectal, Q6H PRN, Newt Minion, MD .  aspirin EC tablet 325 mg, 325 mg, Oral, Daily, Newt Minion, MD, 325 mg at 03/23/16 [id number] .  atorvastatin (LIPITOR) tablet 40 mg, 40 mg, Oral, QHS, Newt Minion, MD, 40 mg at 03/22/16 2054 .  Chlorhexidine Gluconate Cloth 2 % PADS 6 each, 6 each, Topical, Q0600, Newt Minion, MD, 6 each at 03/22/16 0809 .  furosemide (LASIX) tablet 60 mg, 60 mg, Oral, Q48H, 60 mg at 03/22/16 0805 **AND** furosemide (LASIX) tablet 100 mg, 100 mg, Oral, Q48H, Kimberly B Hammons, RPH, 100 mg at 03/23/16 0921 .  HYDROmorphone (DILAUDID) injection 1 mg, 1 mg, Intravenous, Q2H PRN, Newt Minion, MD .  insulin aspart (novoLOG) injection 0-20 Units, 0-20 Units, Subcutaneous, TID WC, Newt Minion, MD, 4 Units at 03/22/16 1226 .  insulin aspart (novoLOG) injection 6 Units, 6 Units, Subcutaneous, TID WC, Newt Minion, MD, 6 Units at 03/23/16 [id number] .  insulin detemir (LEVEMIR) injection 10 Units, 10 Units, Subcutaneous, QHS, Newt Minion, MD, 10 Units at 03/22/16 2115 .  isosorbide mononitrate (IMDUR) 24 hr tablet 30 mg, 30 mg, Oral, QHS, Newt Minion, MD, 30 mg at 03/22/16 2054 .  levothyroxine (SYNTHROID, LEVOTHROID) tablet 100 mcg, 100 mcg, Oral, QAC breakfast, Newt Minion, MD, 100 mcg at 03/23/16 [id number] .  methocarbamol (ROBAXIN) tablet 500 mg,  500 mg, Oral, Q6H PRN **OR** methocarbamol (ROBAXIN) 500 mg in dextrose 5 % 50 mL IVPB, 500 mg, Intravenous, Q6H PRN, Newt Minion, MD .  metoCLOPramide (REGLAN) tablet 5-10 mg, 5-10 mg, Oral, Q8H PRN **OR** metoCLOPramide (REGLAN) injection 5-10 mg, 5-10 mg, Intravenous, Q8H PRN, Newt Minion, MD .  mupirocin ointment (BACTROBAN) 2 % 1 application, 1 application, Nasal, BID, Newt Minion, MD, 1 application at [id number] [phone] .  ondansetron (ZOFRAN) tablet 4 mg, 4 mg, Oral, Q6H PRN **OR** ondansetron (ZOFRAN) injection 4 mg, 4 mg, Intravenous, Q6H PRN, Newt Minion, MD .  oxyCODONE (Oxy IR/ROXICODONE) immediate release tablet 5-10 mg, 5-10 mg, Oral, Q3H PRN, Newt Minion, MD .  sucralfate (CARAFATE) 1 GM/10ML suspension 1 g, 1 g, Oral, TID WC & HS, Newt Minion, MD, 1 g at 03/23/16 [id number]  Patients Current Diet: Diet Carb Modified Fluid consistency:: Thin; Room service appropriate?: Yes Diet - low sodium heart healthy  Precautions / Restrictions Precautions Precautions: Fall Other Brace/Splint: post op shoe Restrictions Weight Bearing Restrictions: Yes LLE Weight Bearing: Touchdown weight bearing   Has the patient had 2 or more falls or a fall with injury in the past year?No  Prior Activity Level Limited Community (1-2x/wk): limited, Drives. Uses   Home Assistive Devices / Equipment Home Assistive Devices/Equipment: CBG Meter, Eyeglasses, Environmental consultant (specify type) Home Equipment: Walker - 4 wheels, Shower seat - built in  Prior Device Use: Indicate devices/aids used by the patient prior to current illness, exacerbation or injury? seated RW as a wheelchair  Prior Functional Level Prior Function Level of Independence: Independent with assistive device(s) Comments: Independent with rollator. States she normally just scoots around in her rollator while sitting.  "I know I'm not supposed to do that, but I do"  Self Care: Did the patient need help bathing, dressing, using the toilet or  eating?  Independent  Indoor Mobility: Did the patient need assistance with walking from room to room (with or without device)? Independent  Stairs: Did the patient need assistance with internal or external stairs (with or without device)? Needed some help  Functional Cognition: Did the patient need help planning regular tasks such as shopping or remembering to take medications? Needed some help  Current Functional Level Cognition  Overall Cognitive Status: Within Functional Limits for tasks assessed Orientation Level: Oriented X4    Extremity Assessment (includes Sensation/Coordination)  Upper Extremity Assessment: Generalized weakness  Lower Extremity Assessment: LLE deficits/detail, Generalized weakness LLE Deficits / Details: L wrapping    ADLs  Overall ADL's : Needs assistance/impaired Eating/Feeding: Independent, Sitting Grooming: Set up, Sitting Upper Body Bathing: Set up, Sitting Lower Body Bathing: Minimal assistance, Sit to/from stand Upper Body Dressing : Set up, Sitting Lower Body Dressing: Moderate assistance (min A sit<>stand) Toilet Transfer: Minimal assistance, Stand-pivot, Squat-pivot, BSC Toileting- Clothing Manipulation and Hygiene: Min guard (with min A for standing balance)    Mobility  General bed mobility comments: Pt sitting up in recliner upon arrival    Transfers  Overall transfer level: Needs assistance Equipment used: None Transfers: Sit to/from Stand, Google Transfers Sit to Stand: Min guard Stand pivot transfers: Min guard (bed to rollator) Squat pivot transfers: Min guard General transfer comment: working on hand placement and pivot techniques.     Ambulation / Gait / Stairs / Wheelchair Mobility  Ambulation/Gait General Gait Details: Pt refused ambulation attempts.  Only agreeable to SPT    Posture / Balance Balance Overall balance assessment: Needs assistance Sitting-balance support: No upper extremity supported, Feet  supported Sitting balance-Leahy Scale: Good Standing balance support: Bilateral upper extremity supported, During functional activity Standing balance-Leahy Scale: Poor Standing balance comment: reliant on RW due to TDWB on LLE    Special needs/care consideration Skin WOC wound consult note Reason for Consult: removal of post op surgical dressing and replacement of 4 layer compression wrap to the LLE. Wound type: surgical Measurement:10 cn  Wound bed: closed with  sutures Drainage (amount, consistency, odor) serosanginous, saturated through dressing and strike through on outer layer of compression bandage.  Periwound: intact  Dressing procedure/placement/frequency: Incision covered with non-adherent (oil emulsion dressing), 2 ABD pads used to cover area for drainage control. 4 layer compression wrap applied from amputation site to the patellar notch.  Dr. Sharol Given to direct when compression wrap to be changed (note states in office in one week).  Discussed POC with patient and bedside nurse.  Re consult if needed, will not follow at this time. Thanks Melody Kellogg, [place] [phone]) 03/23/16 Bowel mgmt: continent Bladder mgmt: continent Diabetic mgmt pt fully aware when low CBGS and treatments   Previous Home Environment Living Arrangements: Alone  Lives With: Alone Available Help at Discharge: Neighbor, Available PRN/intermittently Type of Home: House Home Layout: Two level, Able to live on main level with bedroom/bathroom Home Access: Stairs to enter Entrance Stairs-Rails: Right, Left Entrance Stairs-Number of Steps: 6 Bathroom Shower/Tub: Multimedia programmer: Standard Bathroom Accessibility: Yes How Accessible: Accessible via walker [place]: No  Discharge Living Setting Plans for Discharge Living Setting: Patient's home, Alone Type of Home at Discharge: House Discharge Home Layout: Two level, Able to live on main level with  bedroom/bathroom Discharge Home Access: Stairs to enter Entrance Stairs-Rails: Right, Left, Can reach both Entrance Stairs-Number of Steps: 6 Discharge Bathroom Shower/Tub: Walk-in shower Discharge Bathroom Toilet: Standard Discharge Bathroom Accessibility: Yes How Accessible: Accessible via walker Does the patient have any problems obtaining your medications?: No  Social/Family/Support Systems Anticipated Caregiver: pt plans to hire some private duty assist and friends to come by prn Anticipated Caregiver's Contact Information: see above Ability/Limitations of Caregiver: friends prn only Caregiver Availability: Intermittent Discharge Plan Discussed with Primary Caregiver: Yes Is Caregiver In Agreement with Plan?: Yes Does Caregiver/Family have Issues with Lodging/Transportation while Pt is in Rehab?: No  Goals/Additional Needs Patient/Family Goal for Rehab: Mod I with PT and OT Expected length of stay: [facility] 4-5 days. Pt wants to d/c by Tuesday 6/13 Dietary Needs: carb modified  Special Service Needs: pt states she manages her CBGs and sleeps better at home Additional Information: pt planning to hire some assist am and pm at home  Pt/Family Agrees to Admission and willing to participate: Yes Program Orientation Provided & Reviewed with Pt/Caregiver Including Roles  & Responsibilities: Yes  Decrease burden of Care through IP rehab admission: n/a  Possible need for SNF placement upon discharge: pt refuses SNF. Will hire some assist if needed at d/c  Patient Condition: This patient's condition remains as documented in the consult dated 03/23/16, in which the Rehabilitation Physician determined and documented that the patient's condition is appropriate for intensive rehabilitative care in an inpatient rehabilitation facility. Will admit to inpatient rehab today.  Preadmission Screen Completed By:  Cleatrice Burke, 03/23/2016 2:20  PM ______________________________________________________________________   Discussed status with Dr. Naaman Plummer on 03/23/16 at 1420 and received telephone approval for admission today.  Admission Coordinator:  Cleatrice Burke, time [id number] Date 03/23/16.

## 2016-03-23 NOTE — Progress Notes (Signed)
Pt is in agreement to an inpt rehab admission for she feels she needs more therapy as well as time to arrange some assistance at home for d/c. I will alert RNCM and make the arrangements to admit today. [id number]

## 2016-03-23 NOTE — Consult Note (Signed)
WOC wound consult note Reason for Consult: removal of post op surgical dressing and replacement of 4 layer compression wrap to the LLE. Wound type: surgical Measurement:10 cn  Wound bed: closed with sutures Drainage (amount, consistency, odor) serosanginous, saturated through dressing and strike through on outer layer of compression bandage.  Periwound: intact  Dressing procedure/placement/frequency: Incision covered with non-adherent (oil emulsion dressing), 2 ABD pads used to cover area for drainage control.  4 layer compression wrap applied from amputation site to the patellar notch.   Dr. Sharol Given to direct when compression wrap to be changed (note states in office in one week).  Discussed POC with patient and bedside nurse.  Re consult if needed, will not follow at this time. Thanks  [name], [place] [phone])

## 2016-03-23 NOTE — Care Management (Signed)
Patient will be going to CIR for rehab. CM signed off.

## 2016-03-23 NOTE — Progress Notes (Signed)
Received pt. As a transfer from [facility].Pt. Was oriented to the unit routine and protocol.Safety plan was explained,fall prevention plan was explained and signed by RN and pt. Welcome video was played.

## 2016-03-23 NOTE — Evaluation (Signed)
Occupational Therapy Evaluation and Discharge Patient Details Name: [name] MRN: [id number] DOB: 05/27/1946 Today's Date: 03/23/2016    History of Present Illness Patient is a 70 year old woman with diabetes peripheral vascular disease who is status post revascularization to the left lower extremity who has progressive gangrenous changes of the forefoot. Pt s/p transmetatarsal aputation of L foot (03/21/16) PMH inlcudes severe CAD, CABG, CHF with EF of 25-30%   Clinical Impression   This 70 yo female admitted with above presents to acute OT with deficits below (see OT problem list) thus affecting her PLOF of Mod I.She will benefit from CIR to get to a Mod I to return home. We will D/C from acute OT due to D/C to CIR today.    Follow Up Recommendations  CIR    Equipment Recommendations   (TBD next venue)       Precautions / Restrictions Precautions Precautions: Fall Required Braces or Orthoses: Other Brace/Splint Other Brace/Splint: post op shoe Restrictions Weight Bearing Restrictions: Yes LLE Weight Bearing: Touchdown weight bearing      Mobility Bed Mobility               General bed mobility comments: Pt sitting up in recliner upon arrival  Transfers Overall transfer level: Needs assistance Equipment used: None Transfers: Sit to/from [employer] Sit to partial Stand: Min guard  Squat pivot transfers: Min guard (recliner to 3n1 to rollator to recliner)        Balance Overall balance assessment: Needs assistance Sitting-balance support: No upper extremity supported;Feet supported Sitting balance-Leahy Scale: Good     Standing balance support: Bilateral upper extremity supported;During functional activity Standing balance-Leahy Scale: Poor Standing balance comment: reliant on RW due to TDWB on LLE                            ADL Overall ADL's : Needs assistance/impaired Eating/Feeding: Independent;Sitting   Grooming:  Set up;Sitting   Upper Body Bathing: Set up;Sitting   Lower Body Bathing: Minimal assistance;Sit to/from stand   Upper Body Dressing : Set up;Sitting   Lower Body Dressing: Moderate assistance (min A sit<>stand)   Toilet Transfer: Minimal assistance;Stand-pivot;Squat-pivot;BSC   Toileting- Clothing Manipulation and Hygiene: Min guard (with min A for standing balance)                         Pertinent Vitals/Pain Pain Assessment: No/denies pain      Hand Dominance Right   Extremity/Trunk Assessment Upper Extremity Assessment Upper Extremity Assessment: Generalized weakness           Communication Communication Communication: No difficulties   Cognition Arousal/Alertness: Awake/alert Behavior During Therapy: WFL for tasks assessed/performed Overall Cognitive Status: Within Functional Limits for tasks assessed                                Home Living Family/patient expects to be discharged to:: Private residence Living Arrangements: Alone Available Help at Discharge: Neighbor;Available PRN/intermittently Type of Home: House Home Access: Stairs to enter [employer] of Steps: 6 Entrance Stairs-Rails: Right;Left Home Layout: Two level;Able to live on main level with bedroom/bathroom     Bathroom Shower/Tub: Occupational psychologist: Standard Bathroom Accessibility: Yes How Accessible: Accessible via walker Home Equipment: [place] - 4 wheels;Shower seat - built in      Lives With: Alone  Prior Functioning/Environment Level of Independence: Independent with assistive device(s)        Comments: Independent with rollator. States she normally just scoots around in her rollator while sitting.  "I know I'm not supposed to do that, but I do"    OT Diagnosis: Generalized weakness   OT Problem List: Decreased strength;Decreased range of motion;Impaired balance (sitting and/or standing)      OT Goals(Current goals can  be found in the care plan section) Acute Rehab OT Goals Patient Stated Goal: to rehab then home OT Goal Formulation:  (deferred at this time due to to transfer to rehab today)  OT Frequency:     Barriers to D/C: Decreased caregiver support             End of Session    Activity Tolerance: Patient tolerated treatment well Patient left: in chair;with call bell/phone within reach   Time: [id number] OT Time Calculation (min): 17 min Charges:  OT General Charges $OT Visit: 1 Procedure OT Evaluation $OT Eval Moderate Complexity: 1 Procedure  Almon Register [id number] 03/23/2016, 1:51 PM

## 2016-03-23 NOTE — Progress Notes (Signed)
Orthopedic Tech Progress Note Patient Details:  [name] [birth year] [id number]  Ortho Devices Type of Ortho Device: Postop shoe/boot Ortho Device/Splint Interventions: Application   [name] 03/23/2016, 5:38 PM

## 2016-03-23 NOTE — Progress Notes (Signed)
[name], MD Physician Signed Physical Medicine and Rehabilitation Consult Note 03/23/2016 6:44 AM  Related encounter: Admission (Current) from 03/21/2016 in [place] Collapse All        Physical Medicine and Rehabilitation Consult Reason for Consult: Left transmetatarsal amputation Referring Physician: Dr. Sharol Given   HPI: [name] is a 70 y.o. right handed female with history of hypertension, type 1 diabetes mellitus, CAD with CABG, systolic congestive heart failure, chronic renal insufficiency with baseline creatinine 1.5-1.8, PVD with multiple revascularization procedures as well as history of left transmetatarsal amputation 2013 by Dr. Donnetta Hutching of which at that time she was discharged to [facility] skilled nursing facility. Patient presently lives alone used a rolllator walker prior to admission and still driving. Two-level home bedroom first floor 6 steps to entry. Presented 03/21/2016 with ischemic gangrenous changes left forefoot with plantar ulcer and findings of osteomyelitis. Limb was not felt to be salvageable and underwent left transmetatarsal amputation/revision 03/21/2016 per Dr. Sharol Given.NWB LLE. Hospital course pain management. Follow-up cardiology services to monitor history of chronic systolic congestive heart failure. MRSA PCR screen positive remained on contact precautions. Physical therapy evaluation completed 03/22/2016 noted limited functional mobility. M.D. has requested physical medicine rehabilitation consult.   Review of Systems  Constitutional: Negative for fever and chills.  HENT: Negative for hearing loss.  Eyes: Negative for blurred vision and double vision.  Respiratory: Negative for cough.   Shortness of breath with exertion  Cardiovascular: Positive for palpitations and leg swelling. Negative for chest pain.  Gastrointestinal:   GERD  Genitourinary: Negative for dysuria and hematuria.    Musculoskeletal: Positive for myalgias and joint pain.  Skin: Negative for rash.  Neurological: Positive for weakness. Negative for seizures and headaches.  All other systems reviewed and are negative.  Past Medical History  Diagnosis Date  . HTN (hypertension)   . Hypothyroidism   . History of recurrent UTIs   . CAD (coronary artery disease)     s/p CABG x3 in 1993; last cath in 2010  . Scleroderma ([place])   . Bilateral carpal tunnel syndrome 1970s  . PVD (peripheral vascular disease) ([place])     Toes amputated from left foot and has had prior bypass on left leg. Followed by Dr. Donnetta Hutching  . Claudication ([place])   . Aortic stenosis     0.8cm2 on echo in 10/2011  . Hyperlipidemia     on Lipitor  . Cat bite 2009    with MRSA; required I & D  . Heart murmur   . Blood transfusion     no reaction from transfusion; "when I had heart surgery" (08/28/2012)  . GERD (gastroesophageal reflux disease)   . CHF (congestive heart failure) ([place]) Aug. 2012    Echo 10/2011: Mild LVH, EF 30%, apex akinetic, septal HK, grade 2 diastolic dysfunction, or functionally bicuspid aortic valve, mild aortic stenosis by gradient, severe by calculated AVA-suspect A. Aortic stenosis probably moderate, trivial MR, mild LAE, mild RAE.   [name] Cellulitis   . Anemia   . B12 deficiency anemia     B 12 injection "monthly since 05/2012" (08/28/2012)  . Iron deficiency anemia 06/13/2012    "iron infusion" (08/28/2012  . Anginal pain ([place])     "history; not currently" (08/28/2012)  . Myocardial infarction [facility]) 1986    "silent"  . History of bronchitis     "I've had it twice in my life" (08/28/2012)  . Exertional  dyspnea     "because of the heart failure" (08/28/2012)  . Type 1 diabetes ([place]) 10/1949  . Stomach ulcer 1981?    "on Tagament for ~ 1 yr" (08/28/2012)  . Seizure ([place])     ? seizure like  event in 2010; workup included stress testing which led to repeat cath.   . Arthritis     "hands, hips, all over" (08/28/2012  . Osteoarthritis of both feet   . Breast cancer ([place])     left  . Cellulitis June 2013, Nov. 2013 and Feb. 14, 2014    Left leg  . Osteomyelitis ([place])     left foot   Past Surgical History  Procedure Laterality Date  . Carpal tunnel release  ~ 1970's    bilaterally  . Mastectomy  11/1982    Left Breast  . Vitrectomy  1990's-2004    "both eyes; I've had total of 4"  . Tubal ligation  1984  . Tonsillectomy  1952    "?adenoids"   . Appendectomy  1991  . Femoral bypass      left leg "related to transmetatarsal amputation" (08/28/2012)  . Breast biopsy with sentinel lymph node biopsy and needle localization  1984  . Coronary artery bypass graft  1992    LIMA to LAD, SVG to distal LCX & SVG to Marginal  . Cardiac catheterization  2010    LIMA to LAD patent yet with occluded distal LAD after graft insertion & retrograde is occluded. 3-4 septal perforators arise &are patent. SVG to a large marginal patent; SVG presumably to the distal dominant LCX is occluded, moderately high grade stenosis of the AV circumflex, but with distal stenoses involving a severely diseased marginal branch not amenable to PCI nor is inferior branch   . Cardiac catheterization  10/2011    "unsuccessful attempt of stenting" (08/28/2012)  . Cardiac catheterization  1992; 09/2011  . Breast biopsy    . Trigger finger release  1980's    right thumb  . Incision and drainage of wound  ~ 1967    "infection in left foot" (08/28/2012)  . Incision and drainage of wound  2009    "3 ORs on my right hand after cat bite" (08/28/2012)  . 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 Cataract extraction w/ intraocular lens implant, bilateral   1990's  . Amputation Right 05/27/2013    Procedure: AMPUTATION DIGIT FIFTH TOE; Surgeon: Rosetta Posner, MD; Location: [place]; Service: Vascular; Laterality: Right;  . Left and right heart catheterization with coronary angiogram N/A 09/17/2011    Procedure: LEFT AND RIGHT HEART CATHETERIZATION WITH CORONARY ANGIOGRAM; Surgeon: Hillary Bow, MD; Location: [facility] CATH LAB; Service: Cardiovascular; Laterality: N/A;  . Percutaneous coronary stent intervention (pci-s) N/A 11/15/2011    Procedure: PERCUTANEOUS CORONARY STENT INTERVENTION (PCI-S); Surgeon: Hillary Bow, MD; Location: [facility] CATH LAB; Service: Cardiovascular; Laterality: N/A;  . Lower extremity angiogram N/A 05/25/2013    Procedure: LOWER EXTREMITY ANGIOGRAM POSS INTERVENTION; Surgeon: Conrad [place], MD; Location: [facility] CATH LAB; Service: Cardiovascular; Laterality: N/A;  . Amputation Left 03/21/2016    Procedure: Left Transmetatarsal Amputation; Surgeon: Newt Minion, MD; Location: [place]; Service: Orthopedics; Laterality: Left;   Family History  Problem Relation Age of Onset  . Diabetes Mother   . Heart attack Neg Hx   . Stroke Mother   . Stroke Father   . Hypertension Mother    Social History:  reports  that she has never smoked. She has never used smokeless tobacco. She reports that she does not drink alcohol or use illicit drugs. Allergies:  Allergies  Allergen Reactions  . Cephalexin Swelling and Rash    No specific area  . Ciprofloxacin Swelling and Rash    No specific area  . Zyvox [Linezolid] Other (See Comments)    Thrombocytopenia developed to 50k after several weeks of therapy  . Adhesive [Tape] Other (See Comments)    "old Johnson/Johnson adhesive tape; takes my skin off; can use paper tape"  . Bactrim [Sulfamethoxazole-Trimethoprim] Other (See Comments)    Hyperkalemia and Increased creatinine  . Daptomycin  Other (See Comments)    Had elevated CPK on therapy, not clear that this was due to cubicin  . Vancomycin Other (See Comments)    ? If this played role in her Acute kidney injury   Medications Prior to Admission  Medication Sig Dispense Refill  . aspirin EC 325 MG EC tablet Take 325 mg by mouth daily.     [name] atorvastatin (LIPITOR) 40 MG tablet TAKE 1 TABLET BY MOUTH AT BEDTIME 30 tablet 11  . docusate sodium (COLACE) 50 MG capsule Take 50 mg by mouth at bedtime as needed (For constipation.).    [name] furosemide (LASIX) 20 MG tablet Alternate 3 pills (60 mg total) with 5 pills (100 mg total) every other day until next office visit.. (Patient taking differently: Take 60-100 mg by mouth every other day. Alternate 3 pills (60 mg total) with 5 pills (100 mg total) every other day until next office visit.[name]) 120 tablet 1  . insulin detemir (LEVEMIR) 100 UNIT/ML injection Inject 12-24 Units into the skin 2 (two) times daily. Inject 24 units subutaneously every morning (10am) and 12 units at bedtime (10pm)    . insulin lispro (HUMALOG) 100 UNIT/ML injection Inject 1-12 Units into the skin 4 (four) times daily as needed for high blood sugar (sliding scale).     . isosorbide mononitrate (IMDUR) 30 MG 24 hr tablet Take 30 mg by mouth at bedtime.    [name] levothyroxine (SYNTHROID, LEVOTHROID) 100 MCG tablet Take 100 mcg by mouth daily before breakfast.   5  . Cyanocobalamin 1000 MCG/ML KIT Inject 1,000 mcg into the skin every 8 (eight) weeks.       Home: Home Living Family/patient expects to be discharged to:: Private residence Living Arrangements: Alone Available Help at Discharge: Neighbor, Available PRN/intermittently Type of Home: House Home Access: Stairs to enter [employer] of Steps: 6 Entrance Stairs-Rails: Right, Left Home Layout: Two level, Able to live on main level with bedroom/bathroom Bathroom Shower/Tub: Walk-in shower Home  Equipment: Environmental consultant - 4 wheels, Shower seat - built in  Functional History: Prior Function Level of Independence: Independent with assistive device(s) Comments: I with rollator. States she normally just scoots around in her rollator while sitting. "I know I'm not supposed to do that, but I do" Functional Status:  Mobility: Bed Mobility General bed mobility comments: Pt up in chair Transfers Overall transfer level: Needs assistance Transfers: Stand Pivot Transfers, Sit to/from Stand Sit to Stand: Min guard, Min assist Stand pivot transfers: Min guard, Min assist General transfer comment: Stood from chair without arm rests to rollator and A pt to pull up her pants. SPT chair > bed > rollator> recliner with close min/guard to occasional MIN depending on height of surface and heavy cueing. Took multiple reps and increased time with each transfer to safely perform. Attempted to stand to RW  and pt unable to. Not always open to PT's suggestions. Ambulation/Gait General Gait Details: Pt refused ambulation attempts. Only agreeable to SPT    ADL:    Cognition: Cognition Overall Cognitive Status: Within Functional Limits for tasks assessed Orientation Level: Oriented X4 Cognition Arousal/Alertness: Awake/alert Behavior During Therapy: WFL for tasks assessed/performed Overall Cognitive Status: Within Functional Limits for tasks assessed  Blood pressure 112/43, pulse 76, temperature 98.6 F (37 C), temperature source Oral, resp. rate 18, height 5' 3.3" (1.608 m), weight 77.111 kg (170 lb), SpO2 95 %. Physical Exam  Vitals reviewed. Constitutional: She is oriented to person, place, and time.  HENT:  Head: Normocephalic.  Eyes: EOM are normal.  Neck: Normal range of motion. Neck supple. No thyromegaly present.  Cardiovascular: Normal rate and regular rhythm.  Respiratory: Effort normal and breath sounds normal. No respiratory distress.  GI: Soft. Bowel sounds are normal. She  exhibits no distension.  Neurological: She is alert and oriented to person, place, and time.  UE 5/5. LLE 3/5 RLE 4/5 prox to distal. Decreased LT/PP distally both legs. cogntively intact  Skin:  Left transmetatarsal amputation site intact with substantial sanguinous dc on dressing. Patient with some ischemic changes to right and left lower extremity with Right toe amputation that is well healed     Lab Results Last 24 Hours    Results for orders placed or performed during the hospital encounter of 03/21/16 (from the past 24 hour(s))  Glucose, capillary Status: Abnormal   Collection Time: 03/22/16 8:08 AM  Result Value Ref Range   Glucose-Capillary 187 (H) 65 - 99 mg/dL  Glucose, capillary Status: Abnormal   Collection Time: 03/22/16 11:15 AM  Result Value Ref Range   Glucose-Capillary 199 (H) 65 - 99 mg/dL  Glucose, capillary Status: Abnormal   Collection Time: 03/22/16 4:37 PM  Result Value Ref Range   Glucose-Capillary 154 (H) 65 - 99 mg/dL   Comment 1 Notify RN   Glucose, capillary Status: Abnormal   Collection Time: 03/22/16 9:07 PM  Result Value Ref Range   Glucose-Capillary 220 (H) 65 - 99 mg/dL  Glucose, capillary Status: Abnormal   Collection Time: 03/23/16 5:49 AM  Result Value Ref Range   Glucose-Capillary 208 (H) 65 - 99 mg/dL      Imaging Results (Last 48 hours)    No results found.    Assessment/Plan: Diagnosis: PAD s/p left TMA 1. Does the need for close, 24 hr/day medical supervision in concert with the patient's rehab needs make it unreasonable for this patient to be served in a less intensive setting? Yes 2. Co-Morbidities requiring supervision/potential complications: cad with dCHF, dm, ckd 3. Due to bladder management, bowel management, safety, skin/wound care, disease management, medication administration, pain management and patient education, does the patient require 24  hr/day rehab nursing? Yes 4. Does the patient require coordinated care of a physician, rehab nurse, PT (1-2 hrs/day, 5 days/week) and OT (1-2 hrs/day, 5 days/week) to address physical and functional deficits in the context of the above medical diagnosis(es)? Yes Addressing deficits in the following areas: balance, endurance, locomotion, strength, transferring, bowel/bladder control, bathing, dressing, feeding, grooming, toileting and psychosocial support 5. Can the patient actively participate in an intensive therapy program of at least 3 hrs of therapy per day at least 5 days per week? Yes 6. The potential for patient to make measurable gains while on inpatient rehab is excellent 7. Anticipated functional outcomes upon discharge from inpatient rehab are modified independent with PT, modified independent with  OT, n/a with SLP. 8. Estimated rehab length of stay to reach the above functional goals is: 5-7 days 9. Does the patient have adequate social supports and living environment to accommodate these discharge functional goals? Yes 10. Anticipated D/C setting: Home 11. Anticipated post D/C treatments: Foley therapy 12. Overall Rehab/Functional Prognosis: excellent  RECOMMENDATIONS: This patient's condition is appropriate for continued rehabilitative care in the following setting: CIR Patient has agreed to participate in recommended program. Yes and Potentially Note that insurance prior authorization may be required for reimbursement for recommended care.  Comment: Pt may not have adequate supports lined up to meet needs. Wound seen during dressing change by WOC RN. It still demonstrates substantial drainage. Would benefit from a brief inpatient rehab admit before going home to monitor leg, CV issues, etc.   [name], MD, [place] [facility] 03/23/2016     03/23/2016       Revision History     Date/Time User Provider Type Action   03/23/2016 12:52  PM [name], MD Physician Sign   03/23/2016 12:35 PM Cathlyn Parsons, PA-C Physician Assistant [place]   View Details Report       Routing History     Date/Time From To Method   03/23/2016 12:52 PM [name], MD No Pcp Per Patient In Basket

## 2016-03-23 NOTE — Consult Note (Signed)
Physical Medicine and Rehabilitation Consult Reason for Consult: Left transmetatarsal amputation Referring Physician: Dr. Sharol Given   HPI: [name] is a 70 y.o. right handed female with history of hypertension, type 1 diabetes mellitus, CAD with CABG, systolic congestive heart failure, chronic renal insufficiency with baseline creatinine 1.5-1.8, PVD with multiple revascularization procedures as well as history of left transmetatarsal amputation 2013 by Dr. Donnetta Hutching of which at that time she was discharged to [facility] skilled nursing facility. Patient presently lives alone used a rolllator walker prior to admission and still driving. Two-level home bedroom first floor 6 steps to entry. Presented 03/21/2016 with ischemic gangrenous changes left forefoot with plantar ulcer and findings of osteomyelitis. Limb was not felt to be salvageable and underwent left transmetatarsal amputation/revision 03/21/2016 per Dr. Sharol Given.NWB LLE. Hospital course pain management. Follow-up cardiology services to monitor history of chronic systolic congestive heart failure. MRSA PCR screen positive remained on contact precautions. Physical therapy evaluation completed 03/22/2016 noted limited functional mobility. M.D. has requested physical medicine rehabilitation consult.   Review of Systems  Constitutional: Negative for fever and chills.  HENT: Negative for hearing loss.   Eyes: Negative for blurred vision and double vision.  Respiratory: Negative for cough.        Shortness of breath with exertion  Cardiovascular: Positive for palpitations and leg swelling. Negative for chest pain.  Gastrointestinal:       GERD  Genitourinary: Negative for dysuria and hematuria.  Musculoskeletal: Positive for myalgias and joint pain.  Skin: Negative for rash.  Neurological: Positive for weakness. Negative for seizures and headaches.  All other systems reviewed and are negative.  Past Medical History  Diagnosis Date    . HTN (hypertension)   . Hypothyroidism   . History of recurrent UTIs   . CAD (coronary artery disease)     s/p CABG x3 in 1993; last cath in 2010  . Scleroderma ([place])   . Bilateral carpal tunnel syndrome 1970s  . PVD (peripheral vascular disease) ([place])     Toes amputated from left foot and has had prior bypass on left leg. Followed by Dr. Donnetta Hutching  . Claudication ([place])   . Aortic stenosis     0.8cm2 on echo in 10/2011  . Hyperlipidemia     on Lipitor  . Cat bite 2009    with MRSA; required I & D  . Heart murmur   . Blood transfusion     no reaction from transfusion; "when I had heart surgery" (08/28/2012)  . GERD (gastroesophageal reflux disease)   . CHF (congestive heart failure) ([place]) Aug. 2012    Echo 10/2011: Mild LVH, EF 30%, apex akinetic, septal HK, grade 2 diastolic dysfunction, or functionally bicuspid aortic valve, mild aortic stenosis by gradient, severe by calculated AVA-suspect A. Aortic stenosis probably moderate, trivial MR, mild LAE, mild RAE.   [name] Cellulitis   . Anemia   . B12 deficiency anemia     B 12 injection "monthly since 05/2012" (08/28/2012)  . Iron deficiency anemia 06/13/2012    "iron infusion" (08/28/2012  . Anginal pain ([place])     "history; not currently" (08/28/2012)  . Myocardial infarction [facility]) 1986    "silent"  . History of bronchitis     "I've had it twice in my life" (08/28/2012)  . Exertional dyspnea     "because of the heart failure" (08/28/2012)  . Type 1 diabetes ([place]) 10/1949  . Stomach ulcer 1981?    "on Tagament for ~  1 yr" (08/28/2012)  . Seizure ([place])     ? seizure like event in 2010; workup included stress testing which led to repeat cath.   . Arthritis     "hands, hips, all over" (08/28/2012  . Osteoarthritis of both feet   . Breast cancer ([place])     left  . Cellulitis June 2013, Nov. 2013 and Feb. 14, 2014    Left leg  . Osteomyelitis ([place])     left foot   Past Surgical History  Procedure Laterality Date  . Carpal tunnel  release  ~ 1970's    bilaterally  . Mastectomy  11/1982    Left Breast  . Vitrectomy  1990's-2004    "both eyes; I've had total of 4"  . Tubal ligation  1984  . Tonsillectomy  1952    "?adenoids"   . Appendectomy  1991  . Femoral bypass      left leg "related to transmetatarsal amputation" (08/28/2012)  . Breast biopsy with sentinel lymph node biopsy and needle localization  1984  . Coronary artery bypass graft  1992    LIMA to LAD, SVG to distal LCX & SVG to Marginal  . Cardiac catheterization  2010    LIMA to LAD patent yet with occluded distal LAD after graft insertion & retrograde is occluded. 3-4 septal perforators arise &are patent. SVG to a large marginal patent; SVG presumably to the distal dominant LCX is occluded, moderately high grade stenosis of the AV circumflex, but with distal stenoses involving a severely diseased marginal branch not amenable  to PCI  nor is inferior branch   . Cardiac catheterization  10/2011    "unsuccessful attempt of stenting" (08/28/2012)  . Cardiac catheterization  1992; 09/2011  . Breast biopsy    . Trigger finger release  1980's    right thumb  . Incision and drainage of wound  ~ 1967    "infection in left foot" (08/28/2012)  . Incision and drainage of wound  2009    "3 ORs on my right hand after cat bite" (08/28/2012)  . 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 Cataract extraction w/ intraocular lens  implant, bilateral  1990's  . Amputation Right 05/27/2013    Procedure: AMPUTATION DIGIT FIFTH TOE;  Surgeon: Rosetta Posner, MD;  Location: [place];  Service: Vascular;  Laterality: Right;  . Left and right heart catheterization with coronary angiogram N/A 09/17/2011    Procedure: LEFT AND RIGHT HEART CATHETERIZATION WITH CORONARY ANGIOGRAM;  Surgeon: Hillary Bow, MD;  Location: [facility] CATH LAB;  Service: Cardiovascular;  Laterality: N/A;  . Percutaneous coronary stent intervention (pci-s) N/A 11/15/2011    Procedure:  PERCUTANEOUS CORONARY STENT INTERVENTION (PCI-S);  Surgeon: Hillary Bow, MD;  Location: [facility] CATH LAB;  Service: Cardiovascular;  Laterality: N/A;  . Lower extremity angiogram N/A 05/25/2013    Procedure: LOWER EXTREMITY ANGIOGRAM POSS INTERVENTION;  Surgeon: Conrad Enoch, MD;  Location[facility] CATH LAB;  Service: Cardiovascular;  Laterality: N/A;  . Amputation Left 03/21/2016    Procedure: Left Transmetatarsal Amputation;  Surgeon: Newt Minion, MD;  Location: [place];  Service: Orthopedics;  Laterality: Left;   Family History  Problem Relation Age of Onset  . Diabetes Mother   . Heart attack Neg Hx   . Stroke Mother   . Stroke Father   . Hypertension Mother    Social History:  reports that she has never smoked. She has  never used smokeless tobacco. She reports that she does not drink alcohol or use illicit drugs. Allergies:  Allergies  Allergen Reactions  . Cephalexin Swelling and Rash    No specific area  . Ciprofloxacin Swelling and Rash    No specific area  . Zyvox [Linezolid] Other (See Comments)    Thrombocytopenia developed to 50k after several weeks of therapy  . Adhesive [Tape] Other (See Comments)    "old Johnson/Johnson adhesive tape; takes my skin off; can use paper tape"  . Bactrim [Sulfamethoxazole-Trimethoprim] Other (See Comments)    Hyperkalemia and Increased creatinine  . Daptomycin Other (See Comments)    Had elevated CPK on therapy, not clear that this was due to cubicin  . Vancomycin Other (See Comments)    ? If this played role in her Acute kidney injury   Medications Prior to Admission  Medication Sig Dispense Refill  . aspirin EC 325 MG EC tablet Take 325 mg by mouth daily.     [name] atorvastatin (LIPITOR) 40 MG tablet TAKE 1 TABLET BY MOUTH AT BEDTIME 30 tablet 11  . docusate sodium (COLACE) 50 MG capsule Take 50 mg by mouth at bedtime as needed (For constipation.).    [name] furosemide (LASIX) 20 MG tablet Alternate 3 pills (60 mg total) with 5 pills (100 mg total)  every other day until next office visit.. (Patient taking differently: Take 60-100 mg by mouth every other day. Alternate 3 pills (60 mg total) with 5 pills (100 mg total) every other day until next office visit.[name]) 120 tablet 1  . insulin detemir (LEVEMIR) 100 UNIT/ML injection Inject 12-24 Units into the skin 2 (two) times daily. Inject 24 units subutaneously every morning (10am)  and 12 units at bedtime (10pm)    . insulin lispro (HUMALOG) 100 UNIT/ML injection Inject 1-12 Units into the skin 4 (four) times daily as needed for high blood sugar (sliding scale).     . isosorbide mononitrate (IMDUR) 30 MG 24 hr tablet Take 30 mg by mouth at bedtime.    [name] levothyroxine (SYNTHROID, LEVOTHROID) 100 MCG tablet Take 100 mcg by mouth daily before breakfast.   5  . Cyanocobalamin 1000 MCG/ML KIT Inject 1,000 mcg into the skin every 8 (eight) weeks.       Home: Home Living Family/patient expects to be discharged to:: Private residence Living Arrangements: Alone Available Help at Discharge: Neighbor, Available PRN/intermittently Type of Home: House Home Access: Stairs to enter [employer] of Steps: 6 Entrance Stairs-Rails: Right, Left Home Layout: Two level, Able to live on main level with bedroom/bathroom Bathroom Shower/Tub: Walk-in shower Home Equipment: Environmental consultant - 4 wheels, Shower seat - built in  Functional History: Prior Function Level of Independence: Independent with assistive device(s) Comments: I with rollator. States she normally just scoots around in her rollator while sitting.  "I know I'm not supposed to do that, but I do" Functional Status:  Mobility: Bed Mobility General bed mobility comments: Pt up in chair Transfers Overall transfer level: Needs assistance Transfers: Stand Pivot Transfers, Sit to/from Stand Sit to Stand: Min guard, Min assist Stand pivot transfers: Min guard, Min assist General transfer comment: Stood from chair without arm rests to rollator and A pt  to pull up her pants. SPT  chair > bed > rollator> recliner with close min/guard to occasional MIN depending on height of surface and heavy cueing.  Took multiple reps and increased time with each transfer to safely perform. Attempted to stand to RW and pt unable  to.  Not always open to PT's suggestions. Ambulation/Gait General Gait Details: Pt refused ambulation attempts.  Only agreeable to SPT    ADL:    Cognition: Cognition Overall Cognitive Status: Within Functional Limits for tasks assessed Orientation Level: Oriented X4 Cognition Arousal/Alertness: Awake/alert Behavior During Therapy: WFL for tasks assessed/performed Overall Cognitive Status: Within Functional Limits for tasks assessed  Blood pressure 112/43, pulse 76, temperature 98.6 F (37 C), temperature source Oral, resp. rate 18, height 5' 3.3" (1.608 m), weight 77.111 kg (170 lb), SpO2 95 %. Physical Exam  Vitals reviewed. Constitutional: She is oriented to person, place, and time.  HENT:  Head: Normocephalic.  Eyes: EOM are normal.  Neck: Normal range of motion. Neck supple. No thyromegaly present.  Cardiovascular: Normal rate and regular rhythm.   Respiratory: Effort normal and breath sounds normal. No respiratory distress.  GI: Soft. Bowel sounds are normal. She exhibits no distension.  Neurological: She is alert and oriented to person, place, and time.  UE 5/5. LLE 3/5 RLE 4/5 prox to distal. Decreased LT/PP distally both legs. cogntively intact  Skin:  Left transmetatarsal amputation site intact with substantial sanguinous dc on dressing. Patient with some ischemic changes to right and left lower extremity with Right toe amputation that is well healed    Results for orders placed or performed during the hospital encounter of 03/21/16 (from the past 24 hour(s))  Glucose, capillary     Status: Abnormal   Collection Time: 03/22/16  8:08 AM  Result Value Ref Range   Glucose-Capillary 187 (H) 65 - 99 mg/dL    Glucose, capillary     Status: Abnormal   Collection Time: 03/22/16 11:15 AM  Result Value Ref Range   Glucose-Capillary 199 (H) 65 - 99 mg/dL  Glucose, capillary     Status: Abnormal   Collection Time: 03/22/16  4:37 PM  Result Value Ref Range   Glucose-Capillary 154 (H) 65 - 99 mg/dL   Comment 1 Notify RN   Glucose, capillary     Status: Abnormal   Collection Time: 03/22/16  9:07 PM  Result Value Ref Range   Glucose-Capillary 220 (H) 65 - 99 mg/dL  Glucose, capillary     Status: Abnormal   Collection Time: 03/23/16  5:49 AM  Result Value Ref Range   Glucose-Capillary 208 (H) 65 - 99 mg/dL   No results found.  Assessment/Plan: Diagnosis: PAD s/p left TMA 1. Does the need for close, 24 hr/day medical supervision in concert with the patient's rehab needs make it unreasonable for this patient to be served in a less intensive setting? Yes 2. Co-Morbidities requiring supervision/potential complications: cad with dCHF, dm, ckd 3. Due to bladder management, bowel management, safety, skin/wound care, disease management, medication administration, pain management and patient education, does the patient require 24 hr/day rehab nursing? Yes 4. Does the patient require coordinated care of a physician, rehab nurse, PT (1-2 hrs/day, 5 days/week) and OT (1-2 hrs/day, 5 days/week) to address physical and functional deficits in the context of the above medical diagnosis(es)? Yes Addressing deficits in the following areas: balance, endurance, locomotion, strength, transferring, bowel/bladder control, bathing, dressing, feeding, grooming, toileting and psychosocial support 5. Can the patient actively participate in an intensive therapy program of at least 3 hrs of therapy per day at least 5 days per week? Yes 6. The potential for patient to make measurable gains while on inpatient rehab is excellent 7. Anticipated functional outcomes upon discharge from inpatient rehab are modified independent  with PT,  modified independent with OT, n/a with SLP. 8. Estimated rehab length of stay to reach the above functional goals is: 5-7 days 9. Does the patient have adequate social supports and living environment to accommodate these discharge functional goals? Yes 10. Anticipated D/C setting: Home 11. Anticipated post D/C treatments: [facility] therapy 12. Overall Rehab/Functional Prognosis: excellent  RECOMMENDATIONS: This patient's condition is appropriate for continued rehabilitative care in the following setting: CIR Patient has agreed to participate in recommended program. Yes and Potentially Note that insurance prior authorization may be required for reimbursement for recommended care.  Comment: Pt may not have adequate supports lined up to meet needs. Wound seen during dressing change by WOC RN. It still demonstrates substantial drainage. Would benefit from a brief inpatient rehab admit before going home to monitor leg, CV issues, etc.   [name], MD, [place] [facility] 03/23/2016     03/23/2016

## 2016-03-23 NOTE — Progress Notes (Signed)
Patient ID: [name], female   DOB: 03-Nov-1945, 70 y.o.   MRN: [id number] Patient is making slow progress with mobilization. Patient feels that she will require short-term rehabilitation. Orders are written for inpatient versus an outpatient rehabilitation. We will have the compression wrap changed today with a new dressing to her foot.

## 2016-03-23 NOTE — Progress Notes (Signed)
Cristina Gong, RN Rehab Admission Coordinator Signed Physical Medicine and Rehabilitation PMR Pre-admission 03/23/2016 1:35 PM  Related encounter: Admission (Current) from 03/21/2016 in [place]   PMR Admission Coordinator Pre-Admission Assessment  Patient: [name] is an 70 y.o., female MRN: [id number] DOB: 11-14-1945 Height: 5' 3.3" (160.8 cm) Weight: 77.111 kg (170 lb)  Insurance Information HMO: PPO: PCP: IPA: 80/20: yes OTHER: no hmo PRIMARY: Medicare a and b Policy#: [id number] Subscriber: pt Benefits: Phone #: online Name: 03/23/16 Eff. Date: 06/15/2010 Deduct: $1316 Out of Pocket Max: none Life Max: none CIR: 100% SNF: 20 full days Outpatient: 80% Co-Pay: 20% Home Health: 100% Co-Pay: none DME: 80 Co-Pay: 20% Providers: pt choice  SECONDARY: none  Medicaid Application Date: Case Manager:  Disability Application Date: Case Worker:   Emergency Contact Information Contact Information    Name Relation Home Work [place] [phone]      Current Medical History  Patient Admitting Diagnosis: PAD s/p left TMA  History of Present Illness: [name] is a 70 y.o. right handed female with history of hypertension, type 1 diabetes mellitus, CAD with CABG, systolic congestive heart failure, chronic renal insufficiency with baseline creatinine 1.5-1.8, PVD with multiple revascularization procedures as well as history of left transmetatarsal amputation 2013 by Dr. Donnetta Hutching of which at that time she was discharged to [facility] skilled nursing facility. Patient presently lives alone used a rolllator walker prior to admission and  still driving. Presented 03/21/2016 with ischemic gangrenous changes left forefoot with plantar ulcer and findings of osteomyelitis. Limb was not felt to be salvageable and underwent left transmetatarsal amputation/revision 03/21/2016 per Dr. Sharol Given.NWB LLE. Hospital course pain management. Follow-up cardiology services to monitor history of chronic systolic congestive heart failure. MRSA PCR screen positive remained on contact precautions.   Past Medical History  Past Medical History  Diagnosis Date  . HTN (hypertension)   . Hypothyroidism   . History of recurrent UTIs   . CAD (coronary artery disease)     s/p CABG x3 in 1993; last cath in 2010  . Scleroderma ([place])   . Bilateral carpal tunnel syndrome 1970s  . PVD (peripheral vascular disease) ([place])     Toes amputated from left foot and has had prior bypass on left leg. Followed by Dr. Donnetta Hutching  . Claudication ([place])   . Aortic stenosis     0.8cm2 on echo in 10/2011  . Hyperlipidemia     on Lipitor  . Cat bite 2009    with MRSA; required I & D  . Heart murmur   . Blood transfusion     no reaction from transfusion; "when I had heart surgery" (08/28/2012)  . GERD (gastroesophageal reflux disease)   . CHF (congestive heart failure) ([place]) Aug. 2012    Echo 10/2011: Mild LVH, EF 30%, apex akinetic, septal HK, grade 2 diastolic dysfunction, or functionally bicuspid aortic valve, mild aortic stenosis by gradient, severe by calculated AVA-suspect A. Aortic stenosis probably moderate, trivial MR, mild LAE, mild RAE.   [name] Cellulitis   . Anemia   . B12 deficiency anemia     B 12 injection "monthly since 05/2012" (08/28/2012)  . Iron deficiency anemia 06/13/2012    "iron infusion" (08/28/2012  . Anginal pain ([place])     "history; not currently" (08/28/2012)  . Myocardial infarction [facility]) 1986    "silent"  . History of bronchitis     "  I've had it  twice in my life" (08/28/2012)  . Exertional dyspnea     "because of the heart failure" (08/28/2012)  . Type 1 diabetes ([place]) 10/1949  . Stomach ulcer 1981?    "on Tagament for ~ 1 yr" (08/28/2012)  . Seizure ([place])     ? seizure like event in 2010; workup included stress testing which led to repeat cath.   . Arthritis     "hands, hips, all over" (08/28/2012  . Osteoarthritis of both feet   . Breast cancer ([place])     left  . Cellulitis June 2013, Nov. 2013 and Feb. 14, 2014    Left leg  . Osteomyelitis ([place])     left foot    Family History  family history includes Diabetes in her mother; Hypertension in her mother; Stroke in her father and mother. There is no history of Heart attack.  Prior Rehab/Hospitalizations:  Has the patient had major surgery during 100 days prior to admission? No  Current Medications   Current facility-administered medications:  . 0.9 % sodium chloride infusion, , Intravenous, Continuous, Newt Minion, MD, Last Rate: 10 mL/hr at 03/21/16 1618 . acetaminophen (TYLENOL) tablet 650 mg, 650 mg, Oral, Q6H PRN, 650 mg at 03/23/16 0312 **OR** acetaminophen (TYLENOL) suppository 650 mg, 650 mg, Rectal, Q6H PRN, Newt Minion, MD . aspirin EC tablet 325 mg, 325 mg, Oral, Daily, Newt Minion, MD, 325 mg at 03/23/16 [id number] . atorvastatin (LIPITOR) tablet 40 mg, 40 mg, Oral, QHS, Newt Minion, MD, 40 mg at 03/22/16 2054 . Chlorhexidine Gluconate Cloth 2 % PADS 6 each, 6 each, Topical, Q0600, Newt Minion, MD, 6 each at 03/22/16 0809 . furosemide (LASIX) tablet 60 mg, 60 mg, Oral, Q48H, 60 mg at 03/22/16 0805 **AND** furosemide (LASIX) tablet 100 mg, 100 mg, Oral, Q48H, Kimberly B Hammons, RPH, 100 mg at 03/23/16 0921 . HYDROmorphone (DILAUDID) injection 1 mg, 1 mg, Intravenous, Q2H PRN, Newt Minion, MD . insulin aspart (novoLOG) injection 0-20 Units, 0-20 Units, Subcutaneous, TID WC, Newt Minion, MD, 4 Units  at 03/22/16 1226 . insulin aspart (novoLOG) injection 6 Units, 6 Units, Subcutaneous, TID WC, Newt Minion, MD, 6 Units at 03/23/16 [phone] . insulin detemir (LEVEMIR) injection 10 Units, 10 Units, Subcutaneous, QHS, Newt Minion, MD, 10 Units at 03/22/16 2115 . isosorbide mononitrate (IMDUR) 24 hr tablet 30 mg, 30 mg, Oral, QHS, Newt Minion, MD, 30 mg at 03/22/16 2054 . levothyroxine (SYNTHROID, LEVOTHROID) tablet 100 mcg, 100 mcg, Oral, QAC breakfast, Newt Minion, MD, 100 mcg at 03/23/16 [id number] . methocarbamol (ROBAXIN) tablet 500 mg, 500 mg, Oral, Q6H PRN **OR** methocarbamol (ROBAXIN) 500 mg in dextrose 5 % 50 mL IVPB, 500 mg, Intravenous, Q6H PRN, Newt Minion, MD . metoCLOPramide (REGLAN) tablet 5-10 mg, 5-10 mg, Oral, Q8H PRN **OR** metoCLOPramide (REGLAN) injection 5-10 mg, 5-10 mg, Intravenous, Q8H PRN, Newt Minion, MD . mupirocin ointment (BACTROBAN) 2 % 1 application, 1 application, Nasal, BID, Newt Minion, MD, 1 application at [id number] [phone] . ondansetron (ZOFRAN) tablet 4 mg, 4 mg, Oral, Q6H PRN **OR** ondansetron (ZOFRAN) injection 4 mg, 4 mg, Intravenous, Q6H PRN, Newt Minion, MD . oxyCODONE (Oxy IR/ROXICODONE) immediate release tablet 5-10 mg, 5-10 mg, Oral, Q3H PRN, Newt Minion, MD . sucralfate (CARAFATE) 1 GM/10ML suspension 1 g, 1 g, Oral, TID WC & HS, Newt Minion, MD, 1 g at 03/23/16 [id number]  Patients Current Diet: Diet Carb Modified  Fluid consistency:: Thin; Room service appropriate?: Yes Diet - low sodium heart healthy  Precautions / Restrictions Precautions Precautions: Fall Other Brace/Splint: post op shoe Restrictions Weight Bearing Restrictions: Yes LLE Weight Bearing: Touchdown weight bearing   Has the patient had 2 or more falls or a fall with injury in the past year?No  Prior Activity Level Limited Community (1-2x/wk): limited, Drives. Uses   Home Assistive Devices / Equipment Home Assistive Devices/Equipment: CBG Meter, Eyeglasses, Environmental consultant  (specify type) Home Equipment: Walker - 4 wheels, Shower seat - built in  Prior Device Use: Indicate devices/aids used by the patient prior to current illness, exacerbation or injury? seated RW as a wheelchair  Prior Functional Level Prior Function Level of Independence: Independent with assistive device(s) Comments: Independent with rollator. States she normally just scoots around in her rollator while sitting. "I know I'm not supposed to do that, but I do"  Self Care: Did the patient need help bathing, dressing, using the toilet or eating? Independent  Indoor Mobility: Did the patient need assistance with walking from room to room (with or without device)? Independent  Stairs: Did the patient need assistance with internal or external stairs (with or without device)? Needed some help  Functional Cognition: Did the patient need help planning regular tasks such as shopping or remembering to take medications? Needed some help  Current Functional Level Cognition  Overall Cognitive Status: Within Functional Limits for tasks assessed Orientation Level: Oriented X4   Extremity Assessment (includes Sensation/Coordination)  Upper Extremity Assessment: Generalized weakness  Lower Extremity Assessment: LLE deficits/detail, Generalized weakness LLE Deficits / Details: L wrapping    ADLs  Overall ADL's : Needs assistance/impaired Eating/Feeding: Independent, Sitting Grooming: Set up, Sitting Upper Body Bathing: Set up, Sitting Lower Body Bathing: Minimal assistance, Sit to/from stand Upper Body Dressing : Set up, Sitting Lower Body Dressing: Moderate assistance (min A sit<>stand) Toilet Transfer: Minimal assistance, Stand-pivot, Squat-pivot, BSC Toileting- Clothing Manipulation and Hygiene: Min guard (with min A for standing balance)    Mobility  General bed mobility comments: Pt sitting up in recliner upon arrival    Transfers  Overall transfer level: Needs  assistance Equipment used: None Transfers: Sit to/from Stand, Google Transfers Sit to Stand: Min guard Stand pivot transfers: Min guard (bed to rollator) Squat pivot transfers: Min guard General transfer comment: working on hand placement and pivot techniques.     Ambulation / Gait / Stairs / Wheelchair Mobility  Ambulation/Gait General Gait Details: Pt refused ambulation attempts. Only agreeable to SPT    Posture / Balance Balance Overall balance assessment: Needs assistance Sitting-balance support: No upper extremity supported, Feet supported Sitting balance-Leahy Scale: Good Standing balance support: Bilateral upper extremity supported, During functional activity Standing balance-Leahy Scale: Poor Standing balance comment: reliant on RW due to TDWB on LLE    Special needs/care consideration Skin WOC wound consult note Reason for Consult: removal of post op surgical dressing and replacement of 4 layer compression wrap to the LLE. Wound type: surgical Measurement:10 cn  Wound bed: closed with sutures Drainage (amount, consistency, odor) serosanginous, saturated through dressing and strike through on outer layer of compression bandage.  Periwound: intact  Dressing procedure/placement/frequency: Incision covered with non-adherent (oil emulsion dressing), 2 ABD pads used to cover area for drainage control. 4 layer compression wrap applied from amputation site to the patellar notch.  Dr. Sharol Given to direct when compression wrap to be changed (note states in office in one week).  Discussed POC with patient and bedside nurse.  Re consult if needed, will not follow at this time. Thanks Melody Kellogg, [place] [phone]) 03/23/16 Bowel mgmt: continent Bladder mgmt: continent Diabetic mgmt pt fully aware when low CBGS and treatments   Previous Home Environment Living Arrangements: Alone Lives With: Alone Available Help at Discharge: Neighbor, Available  PRN/intermittently Type of Home: House Home Layout: Two level, Able to live on main level with bedroom/bathroom Home Access: Stairs to enter Entrance Stairs-Rails: Right, Left Entrance Stairs-Number of Steps: 6 Bathroom Shower/Tub: Multimedia programmer: Standard Bathroom Accessibility: Yes How Accessible: Accessible via walker [place]: No  Discharge Living Setting Plans for Discharge Living Setting: Patient's home, Alone Type of Home at Discharge: House Discharge Home Layout: Two level, Able to live on main level with bedroom/bathroom Discharge Home Access: Stairs to enter Entrance Stairs-Rails: Right, Left, Can reach both Entrance Stairs-Number of Steps: 6 Discharge Bathroom Shower/Tub: Walk-in shower Discharge Bathroom Toilet: Standard Discharge Bathroom Accessibility: Yes How Accessible: Accessible via walker Does the patient have any problems obtaining your medications?: No  Social/Family/Support Systems Anticipated Caregiver: pt plans to hire some private duty assist and friends to come by prn Anticipated Caregiver's Contact Information: see above Ability/Limitations of Caregiver: friends prn only Caregiver Availability: Intermittent Discharge Plan Discussed with Primary Caregiver: Yes Is Caregiver In Agreement with Plan?: Yes Does Caregiver/Family have Issues with Lodging/Transportation while Pt is in Rehab?: No  Goals/Additional Needs Patient/Family Goal for Rehab: Mod I with PT and OT Expected length of stay: ELOS 4-5 days. Pt wants to d/c by Tuesday 6/13 Dietary Needs: carb modified  Special Service Needs: pt states she manages her CBGs and sleeps better at home Additional Information: pt planning to hire some assist am and pm at home  Pt/Family Agrees to Admission and willing to participate: Yes Program Orientation Provided & Reviewed with Pt/Caregiver Including Roles & Responsibilities: Yes  Decrease burden of Care through IP rehab  admission: n/a  Possible need for SNF placement upon discharge: pt refuses SNF. Will hire some assist if needed at d/c  Patient Condition: This patient's condition remains as documented in the consult dated 03/23/16, in which the Rehabilitation Physician determined and documented that the patient's condition is appropriate for intensive rehabilitative care in an inpatient rehabilitation facility. Will admit to inpatient rehab today.  Preadmission Screen Completed By: Cleatrice Burke, 03/23/2016 2:20 PM ______________________________________________________________________  Discussed status with Dr. Naaman Plummer on 03/23/16 at 1420 and received telephone approval for admission today.  Admission Coordinator: Cleatrice Burke, time [id number] Date 03/23/16.          Cosigned by: [name], MD at 03/23/2016 2:56 PM  Revision History     Date/Time User Provider Type Action   03/23/2016 2:56 PM [name], MD Physician Cosign   03/23/2016 2:21 PM Cristina Gong, RN Rehab Admission Coordinator Sign

## 2016-03-23 NOTE — Progress Notes (Deleted)
[name] Staggers, MD Physician Signed Physical Medicine and Rehabilitation Consult Note 03/23/2016 6:44 AM  Related encounter: Admission (Current) from 03/21/2016 in [place] Collapse All        Physical Medicine and Rehabilitation Consult Reason for Consult: Left transmetatarsal amputation Referring Physician: Dr. Sharol Given   HPI: [name] is a 70 y.o. right handed female with history of hypertension, type 1 diabetes mellitus, CAD with CABG, systolic congestive heart failure, chronic renal insufficiency with baseline creatinine 1.5-1.8, PVD with multiple revascularization procedures as well as history of left transmetatarsal amputation 2013 by Dr. Donnetta Hutching of which at that time she was discharged to [facility] skilled nursing facility. Patient presently lives alone used a rolllator walker prior to admission and still driving. Two-level home bedroom first floor 6 steps to entry. Presented 03/21/2016 with ischemic gangrenous changes left forefoot with plantar ulcer and findings of osteomyelitis. Limb was not felt to be salvageable and underwent left transmetatarsal amputation/revision 03/21/2016 per Dr. Sharol Given.NWB LLE. Hospital course pain management. Follow-up cardiology services to monitor history of chronic systolic congestive heart failure. MRSA PCR screen positive remained on contact precautions. Physical therapy evaluation completed 03/22/2016 noted limited functional mobility. M.D. has requested physical medicine rehabilitation consult.   Review of Systems  Constitutional: Negative for fever and chills.  HENT: Negative for hearing loss.  Eyes: Negative for blurred vision and double vision.  Respiratory: Negative for cough.   Shortness of breath with exertion  Cardiovascular: Positive for palpitations and leg swelling. Negative for chest pain.  Gastrointestinal:   GERD  Genitourinary: Negative for dysuria and hematuria.    Musculoskeletal: Positive for myalgias and joint pain.  Skin: Negative for rash.  Neurological: Positive for weakness. Negative for seizures and headaches.  All other systems reviewed and are negative.  Past Medical History  Diagnosis Date  . HTN (hypertension)   . Hypothyroidism   . History of recurrent UTIs   . CAD (coronary artery disease)     s/p CABG x3 in 1993; last cath in 2010  . Scleroderma ([place])   . Bilateral carpal tunnel syndrome 1970s  . PVD (peripheral vascular disease) ([place])     Toes amputated from left foot and has had prior bypass on left leg. Followed by Dr. Donnetta Hutching  . Claudication ([place])   . Aortic stenosis     0.8cm2 on echo in 10/2011  . Hyperlipidemia     on Lipitor  . Cat bite 2009    with MRSA; required I & D  . Heart murmur   . Blood transfusion     no reaction from transfusion; "when I had heart surgery" (08/28/2012)  . GERD (gastroesophageal reflux disease)   . CHF (congestive heart failure) ([place]) Aug. 2012    Echo 10/2011: Mild LVH, EF 30%, apex akinetic, septal HK, grade 2 diastolic dysfunction, or functionally bicuspid aortic valve, mild aortic stenosis by gradient, severe by calculated AVA-suspect A. Aortic stenosis probably moderate, trivial MR, mild LAE, mild RAE.   [name] Cellulitis   . Anemia   . B12 deficiency anemia     B 12 injection "monthly since 05/2012" (08/28/2012)  . Iron deficiency anemia 06/13/2012    "iron infusion" (08/28/2012  . Anginal pain ([place])     "history; not currently" (08/28/2012)  . Myocardial infarction [facility]) 1986    "silent"  . History of bronchitis     "I've had it twice in my life" (08/28/2012)  . Exertional  dyspnea     "because of the heart failure" (08/28/2012)  . Type 1 diabetes ([place]) 10/1949  . Stomach ulcer 1981?    "on Tagament for ~ 1 yr" (08/28/2012)  . Seizure ([place])     ? seizure like  event in 2010; workup included stress testing which led to repeat cath.   . Arthritis     "hands, hips, all over" (08/28/2012  . Osteoarthritis of both feet   . Breast cancer ([place])     left  . Cellulitis June 2013, Nov. 2013 and Feb. 14, 2014    Left leg  . Osteomyelitis ([place])     left foot   Past Surgical History  Procedure Laterality Date  . Carpal tunnel release  ~ 1970's    bilaterally  . Mastectomy  11/1982    Left Breast  . Vitrectomy  1990's-2004    "both eyes; I've had total of 4"  . Tubal ligation  1984  . Tonsillectomy  1952    "?adenoids"   . Appendectomy  1991  . Femoral bypass      left leg "related to transmetatarsal amputation" (08/28/2012)  . Breast biopsy with sentinel lymph node biopsy and needle localization  1984  . Coronary artery bypass graft  1992    LIMA to LAD, SVG to distal LCX & SVG to Marginal  . Cardiac catheterization  2010    LIMA to LAD patent yet with occluded distal LAD after graft insertion & retrograde is occluded. 3-4 septal perforators arise &are patent. SVG to a large marginal patent; SVG presumably to the distal dominant LCX is occluded, moderately high grade stenosis of the AV circumflex, but with distal stenoses involving a severely diseased marginal branch not amenable to PCI nor is inferior branch   . Cardiac catheterization  10/2011    "unsuccessful attempt of stenting" (08/28/2012)  . Cardiac catheterization  1992; 09/2011  . Breast biopsy    . Trigger finger release  1980's    right thumb  . Incision and drainage of wound  ~ 1967    "infection in left foot" (08/28/2012)  . Incision and drainage of wound  2009    "3 ORs on my right hand after cat bite" (08/28/2012)  . 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 Cataract extraction w/ intraocular lens implant, bilateral   1990's  . Amputation Right 05/27/2013    Procedure: AMPUTATION DIGIT FIFTH TOE; Surgeon: Rosetta Posner, MD; Location: [place]; Service: Vascular; Laterality: Right;  . Left and right heart catheterization with coronary angiogram N/A 09/17/2011    Procedure: LEFT AND RIGHT HEART CATHETERIZATION WITH CORONARY ANGIOGRAM; Surgeon: Hillary Bow, MD; Location: [facility] CATH LAB; Service: Cardiovascular; Laterality: N/A;  . Percutaneous coronary stent intervention (pci-s) N/A 11/15/2011    Procedure: PERCUTANEOUS CORONARY STENT INTERVENTION (PCI-S); Surgeon: Hillary Bow, MD; Location: [facility] CATH LAB; Service: Cardiovascular; Laterality: N/A;  . Lower extremity angiogram N/A 05/25/2013    Procedure: LOWER EXTREMITY ANGIOGRAM POSS INTERVENTION; Surgeon: Conrad [place], MD; Location: [facility] CATH LAB; Service: Cardiovascular; Laterality: N/A;  . Amputation Left 03/21/2016    Procedure: Left Transmetatarsal Amputation; Surgeon: Newt Minion, MD; Location: [place]; Service: Orthopedics; Laterality: Left;   Family History  Problem Relation Age of Onset  . Diabetes Mother   . Heart attack Neg Hx   . Stroke Mother   . Stroke Father   . Hypertension Mother    Social History:  reports  that she has never smoked. She has never used smokeless tobacco. She reports that she does not drink alcohol or use illicit drugs. Allergies:  Allergies  Allergen Reactions  . Cephalexin Swelling and Rash    No specific area  . Ciprofloxacin Swelling and Rash    No specific area  . Zyvox [Linezolid] Other (See Comments)    Thrombocytopenia developed to 50k after several weeks of therapy  . Adhesive [Tape] Other (See Comments)    "old Johnson[employer]Johnson adhesive tape; takes my skin off; can use paper tape"  . Bactrim [Sulfamethoxazole-Trimethoprim] Other (See Comments)    Hyperkalemia and Increased creatinine  . Daptomycin  Other (See Comments)    Had elevated CPK on therapy, not clear that this was due to cubicin  . Vancomycin Other (See Comments)    ? If this played role in her Acute kidney injury   Medications Prior to Admission  Medication Sig Dispense Refill  . aspirin EC 325 MG EC tablet Take 325 mg by mouth daily.     [name] atorvastatin (LIPITOR) 40 MG tablet TAKE 1 TABLET BY MOUTH AT BEDTIME 30 tablet 11  . docusate sodium (COLACE) 50 MG capsule Take 50 mg by mouth at bedtime as needed (For constipation.).    [name] furosemide (LASIX) 20 MG tablet Alternate 3 pills (60 mg total) with 5 pills (100 mg total) every other day until next office visit.. (Patient taking differently: Take 60-100 mg by mouth every other day. Alternate 3 pills (60 mg total) with 5 pills (100 mg total) every other day until next office visit.[name]) 120 tablet 1  . insulin detemir (LEVEMIR) 100 UNIT/ML injection Inject 12-24 Units into the skin 2 (two) times daily. Inject 24 units subutaneously every morning (10am) and 12 units at bedtime (10pm)    . insulin lispro (HUMALOG) 100 UNIT/ML injection Inject 1-12 Units into the skin 4 (four) times daily as needed for high blood sugar (sliding scale).     . isosorbide mononitrate (IMDUR) 30 MG 24 hr tablet Take 30 mg by mouth at bedtime.    [name] levothyroxine (SYNTHROID, LEVOTHROID) 100 MCG tablet Take 100 mcg by mouth daily before breakfast.   5  . Cyanocobalamin 1000 MCG/ML KIT Inject 1,000 mcg into the skin every 8 (eight) weeks.       Home: Home Living Family/patient expects to be discharged to:: Private residence Living Arrangements: Alone Available Help at Discharge: Neighbor, Available PRN/intermittently Type of Home: House Home Access: Stairs to enter [employer] of Steps: 6 Entrance Stairs-Rails: Right, Left Home Layout: Two level, Able to live on main level with bedroom/bathroom Bathroom Shower/Tub: Walk-in shower Home  Equipment: Environmental consultant - 4 wheels, Shower seat - built in  Functional History: Prior Function Level of Independence: Independent with assistive device(s) Comments: I with rollator. States she normally just scoots around in her rollator while sitting. "I know I'm not supposed to do that, but I do" Functional Status:  Mobility: Bed Mobility General bed mobility comments: Pt up in chair Transfers Overall transfer level: Needs assistance Transfers: Stand Pivot Transfers, Sit to/from Stand Sit to Stand: Min guard, Min assist Stand pivot transfers: Min guard, Min assist General transfer comment: Stood from chair without arm rests to rollator and A pt to pull up her pants. SPT chair > bed > rollator> recliner with close min/guard to occasional MIN depending on height of surface and heavy cueing. Took multiple reps and increased time with each transfer to safely perform. Attempted to stand to RW  and pt unable to. Not always open to PT's suggestions. Ambulation/Gait General Gait Details: Pt refused ambulation attempts. Only agreeable to SPT    ADL:    Cognition: Cognition Overall Cognitive Status: Within Functional Limits for tasks assessed Orientation Level: Oriented X4 Cognition Arousal/Alertness: Awake/alert Behavior During Therapy: WFL for tasks assessed/performed Overall Cognitive Status: Within Functional Limits for tasks assessed  Blood pressure 112/43, pulse 76, temperature 98.6 F (37 C), temperature source Oral, resp. rate 18, height 5' 3.3" (1.608 m), weight 77.111 kg (170 lb), SpO2 95 %. Physical Exam  Vitals reviewed. Constitutional: She is oriented to person, place, and time.  HENT:  Head: Normocephalic.  Eyes: EOM are normal.  Neck: Normal range of motion. Neck supple. No thyromegaly present.  Cardiovascular: Normal rate and regular rhythm.  Respiratory: Effort normal and breath sounds normal. No respiratory distress.  GI: Soft. Bowel sounds are normal. She  exhibits no distension.  Neurological: She is alert and oriented to person, place, and time.  UE 5/5. LLE 3/5 RLE 4/5 prox to distal. Decreased LT/PP distally both legs. cogntively intact  Skin:  Left transmetatarsal amputation site intact with substantial sanguinous dc on dressing. Patient with some ischemic changes to right and left lower extremity with Right toe amputation that is well healed     Lab Results Last 24 Hours    Results for orders placed or performed during the hospital encounter of 03/21/16 (from the past 24 hour(s))  Glucose, capillary Status: Abnormal   Collection Time: 03/22/16 8:08 AM  Result Value Ref Range   Glucose-Capillary 187 (H) 65 - 99 mg/dL  Glucose, capillary Status: Abnormal   Collection Time: 03/22/16 11:15 AM  Result Value Ref Range   Glucose-Capillary 199 (H) 65 - 99 mg/dL  Glucose, capillary Status: Abnormal   Collection Time: 03/22/16 4:37 PM  Result Value Ref Range   Glucose-Capillary 154 (H) 65 - 99 mg/dL   Comment 1 Notify RN   Glucose, capillary Status: Abnormal   Collection Time: 03/22/16 9:07 PM  Result Value Ref Range   Glucose-Capillary 220 (H) 65 - 99 mg/dL  Glucose, capillary Status: Abnormal   Collection Time: 03/23/16 5:49 AM  Result Value Ref Range   Glucose-Capillary 208 (H) 65 - 99 mg/dL      Imaging Results (Last 48 hours)    No results found.    Assessment/Plan: Diagnosis: PAD s/p left TMA 1. Does the need for close, 24 hr/day medical supervision in concert with the patient's rehab needs make it unreasonable for this patient to be served in a less intensive setting? Yes 2. Co-Morbidities requiring supervision/potential complications: cad with dCHF, dm, ckd 3. Due to bladder management, bowel management, safety, skin/wound care, disease management, medication administration, pain management and patient education, does the patient require 24  hr/day rehab nursing? Yes 4. Does the patient require coordinated care of a physician, rehab nurse, PT (1-2 hrs/day, 5 days/week) and OT (1-2 hrs/day, 5 days/week) to address physical and functional deficits in the context of the above medical diagnosis(es)? Yes Addressing deficits in the following areas: balance, endurance, locomotion, strength, transferring, bowel/bladder control, bathing, dressing, feeding, grooming, toileting and psychosocial support 5. Can the patient actively participate in an intensive therapy program of at least 3 hrs of therapy per day at least 5 days per week? Yes 6. The potential for patient to make measurable gains while on inpatient rehab is excellent 7. Anticipated functional outcomes upon discharge from inpatient rehab are modified independent with PT, modified independent with  OT, n/a with SLP. 8. Estimated rehab length of stay to reach the above functional goals is: 5-7 days 9. Does the patient have adequate social supports and living environment to accommodate these discharge functional goals? Yes 10. Anticipated D/C setting: Home 11. Anticipated post D/C treatments: Foley therapy 12. Overall Rehab/Functional Prognosis: excellent  RECOMMENDATIONS: This patient's condition is appropriate for continued rehabilitative care in the following setting: CIR Patient has agreed to participate in recommended program. Yes and Potentially Note that insurance prior authorization may be required for reimbursement for recommended care.  Comment: Pt may not have adequate supports lined up to meet needs. Wound seen during dressing change by WOC RN. It still demonstrates substantial drainage. Would benefit from a brief inpatient rehab admit before going home to monitor leg, CV issues, etc.   [name] Staggers, MD, [place] [facility] 03/23/2016     03/23/2016       Revision History     Date/Time User Provider Type Action   03/23/2016 12:52  PM [name] Staggers, MD Physician Sign   03/23/2016 12:35 PM Cathlyn Parsons, PA-C Physician Assistant [place]   View Details Report       Routing History     Date/Time From To Method   03/23/2016 12:52 PM [name] Staggers, MD No Pcp Per Patient In Basket

## 2016-03-23 NOTE — Interval H&P Note (Signed)
[name] was admitted today to Inpatient Rehabilitation with the diagnosis of left TMA.  The patient's history has been reviewed, patient examined, and there is no change in status.  Patient continues to be appropriate for intensive inpatient rehabilitation.  I have reviewed the patient's chart and labs.  Questions were answered to the patient's satisfaction. The PAPE has been reviewed and assessment remains appropriate.  [name] T 03/23/2016, 4:08 PM

## 2016-03-23 NOTE — H&P (View-Only) (Signed)
Physical Medicine and Rehabilitation Admission H&P     CC: Left TMA due to left foot osteomyelitis with gangrene   HPI:  [name] is a 70 y.o.female with history of HTN, CAD s/p CABG, chronic systolic CHF, DM type 1, CKD,  moderate aortic stenosis,  multiple recent syncope episodes, PAD with multiple revascularization procedures, 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 She was admitted on 03/21/16 for left transmet amputation by Dr. Sharol Given. Post op to be NWB on LLE with Darco shoe and noted to be having difficulty with transfers as well as inability to stand. WOC consulted to help with drainage due to venous stasis and 4 layer compression wrap placed on LLE with plans to follow up with Dr. Sharol Given in a week for dressing change. Therapy evaluation done and CIR recommended for follow up therapy.     Review of Systems  Constitutional: Positive for malaise/fatigue.  HENT: Positive for hearing loss.   Eyes: Positive for blurred vision (right eye without peripheral vision and scleral blucking. decreased vision in left t).  Respiratory: Positive for cough (dry cough for past month) and shortness of breath. Negative for wheezing and stridor.        DOE with minimal activity  Cardiovascular: Negative for chest pain.  Gastrointestinal: Positive for nausea (due to cough for past month), diarrhea and constipation. Negative for heartburn and vomiting.  Musculoskeletal: Positive for joint pain (left hip pain due to positioning). Negative for myalgias, back pain and neck pain.  Skin: Positive for itching (chronic intermittent).  Neurological: Positive for tingling, sensory change and weakness. Negative for headaches.  Psychiatric/Behavioral: The patient is nervous/anxious and has insomnia.       Past Medical History  Diagnosis Date  . HTN (hypertension)   . Hypothyroidism   . History of recurrent UTIs   . CAD (coronary artery disease)     s/p  CABG x3 in 1993; last cath in 2010  . Scleroderma ([name])   . Bilateral carpal tunnel syndrome 1970s  . PVD (peripheral vascular disease) ([place])     Toes amputated from left foot and has had prior bypass on left leg. Followed by Dr. Donnetta Hutching  . Claudication ([place])   . Aortic stenosis     0.8cm2 on echo in 10/2011  . Hyperlipidemia     on Lipitor  . Cat bite 2009    with MRSA; required I & D  . Heart murmur   . Blood transfusion     no reaction from transfusion; "when I had heart surgery" (08/28/2012)  . GERD (gastroesophageal reflux disease)   . CHF (congestive heart failure) ([place]) Aug. 2012    Echo 10/2011: Mild LVH, EF 30%, apex akinetic, septal HK, grade 2 diastolic dysfunction, or functionally bicuspid aortic valve, mild aortic stenosis by gradient, severe by calculated AVA-suspect A. Aortic stenosis probably moderate, trivial MR, mild LAE, mild RAE.   [name] Cellulitis   . Anemia   . B12 deficiency anemia     B 12 injection "monthly since 05/2012" (08/28/2012)  . Iron deficiency anemia 06/13/2012    "iron infusion" (08/28/2012  . Anginal pain ([place])     "history; not currently" (08/28/2012)  . Myocardial infarction [facility]) 1986    "silent"  . History of bronchitis     "I've had it twice in my life" (08/28/2012)  . Exertional dyspnea     "because of the heart failure" (08/28/2012)  . Type 1 diabetes ([place]) 10/1949  .  Stomach ulcer 1981?    "on Tagament for ~ 1 yr" (08/28/2012)  . Seizure ([place])     ? seizure like event in 2010; workup included stress testing which led to repeat cath.   . Arthritis     "hands, hips, all over" (08/28/2012  . Osteoarthritis of both feet   . Breast cancer ([place])     left  . Cellulitis June 2013, Nov. 2013 and Feb. 14, 2014    Left leg  . Osteomyelitis ([place])     left foot    Past Surgical History  Procedure Laterality Date  . Carpal tunnel release  ~ 1970's    bilaterally  . Mastectomy  11/1982    Left Breast  . Vitrectomy  1990's-2004    "both eyes;  I've had total of 4"  . Tubal ligation  1984  . Tonsillectomy  1952    "?adenoids"   . Appendectomy  1991  . Femoral bypass      left leg "related to transmetatarsal amputation" (08/28/2012)  . Breast biopsy with sentinel lymph node biopsy and needle localization  1984  . Coronary artery bypass graft  1992    LIMA to LAD, SVG to distal LCX & SVG to Marginal  . Cardiac catheterization  2010    LIMA to LAD patent yet with occluded distal LAD after graft insertion & retrograde is occluded. 3-4 septal perforators arise &are patent. SVG to a large marginal patent; SVG presumably to the distal dominant LCX is occluded, moderately high grade stenosis of the AV circumflex, but with distal stenoses involving a severely diseased marginal branch not amenable  to PCI  nor is inferior branch   . Cardiac catheterization  10/2011    "unsuccessful attempt of stenting" (08/28/2012)  . Cardiac catheterization  1992; 09/2011  . Breast biopsy    . Trigger finger release  1980's    right thumb  . Incision and drainage of wound  ~ 1967    "infection in left foot" (08/28/2012)  . Incision and drainage of wound  2009    "3 ORs on my right hand after cat bite" (08/28/2012)  . 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 Cataract extraction w/ intraocular lens  implant, bilateral  1990's  . Amputation Right 05/27/2013    Procedure: AMPUTATION DIGIT FIFTH TOE;  Surgeon: Rosetta Posner, MD;  Location: [facility];  Service: Vascular;  Laterality: Right;  . Left and right heart catheterization with coronary angiogram N/A 09/17/2011    Procedure: LEFT AND RIGHT HEART CATHETERIZATION WITH CORONARY ANGIOGRAM;  Surgeon: Hillary Bow, MD;  Location: [facility] CATH LAB;  Service: Cardiovascular;  Laterality: N/A;  . Percutaneous coronary stent intervention (pci-s) N/A 11/15/2011    Procedure: PERCUTANEOUS CORONARY STENT INTERVENTION (PCI-S);  Surgeon: Hillary Bow, MD;  Location: [facility] CATH LAB;  Service:  Cardiovascular;  Laterality: N/A;  . Lower extremity angiogram N/A 05/25/2013    Procedure: LOWER EXTREMITY ANGIOGRAM POSS INTERVENTION;  Surgeon: Conrad Nina, MD;  Location: [facility] C[facility] LAB;  Service: Cardiovascular;  Laterality: N/A;  . Amputation Left 03/21/2016    Procedure: Left Transmetatarsal Amputation;  Surgeon: Newt Minion, MD;  Location: [facility];  Service: Orthopedics;  Laterality: Left;    Family History  Problem Relation Age of Onset  . Diabetes Mother   . Heart attack Neg Hx   . Stroke Mother   . Stroke Father   . Hypertension Mother  Social History:   Divorced --Lives alone. 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  Has a cousin in town and neighbor check in on her. Has been non-ambulatory and gets around in her rollater. She reports that she has never smoked. She has never used smokeless tobacco. She reports that she does not drink alcohol or use illicit drugs.    Allergies  Allergen Reactions  . Cephalexin Swelling and Rash    No specific area  . Ciprofloxacin Swelling and Rash    No specific area  . Zyvox [Linezolid] Other (See Comments)    Thrombocytopenia developed to 50k after several weeks of therapy  . Adhesive [Tape] Other (See Comments)    "old Johnson/Johnson adhesive tape; takes my skin off; can use paper tape"  . Bactrim [Sulfamethoxazole-Trimethoprim] Other (See Comments)    Hyperkalemia and Increased creatinine  . Daptomycin Other (See Comments)    Had elevated CPK on therapy, not clear that this was due to cubicin  . Vancomycin Other (See Comments)    ? If this played role in her Acute kidney injury    Medications Prior to Admission  Medication Sig Dispense Refill  . aspirin EC 325 MG EC tablet Take 325 mg by mouth daily.     [name] atorvastatin (LIPITOR) 40 MG tablet TAKE 1 TABLET BY MOUTH AT BEDTIME 30 tablet 11  . docusate sodium (COLACE) 50 MG capsule Take 50 mg by mouth at bedtime as needed (For constipation.).    [name] furosemide  (LASIX) 20 MG tablet Alternate 3 pills (60 mg total) with 5 pills (100 mg total) every other day until next office visit.. (Patient taking differently: Take 60-100 mg by mouth every other day. Alternate 3 pills (60 mg total) with 5 pills (100 mg total) every other day until next office visit.[name]) 120 tablet 1  . insulin detemir (LEVEMIR) 100 UNIT/ML injection Inject 12-24 Units into the skin 2 (two) times daily. Inject 24 units subutaneously every morning (10am)  and 12 units at bedtime (10pm)    . insulin lispro (HUMALOG) 100 UNIT/ML injection Inject 1-12 Units into the skin 4 (four) times daily as needed for high blood sugar (sliding scale).     . isosorbide mononitrate (IMDUR) 30 MG 24 hr tablet Take 30 mg by mouth at bedtime.    [name] levothyroxine (SYNTHROID, LEVOTHROID) 100 MCG tablet Take 100 mcg by mouth daily before breakfast.   5  . Cyanocobalamin 1000 MCG/ML KIT Inject 1,000 mcg into the skin every 8 (eight) weeks.       Home: Home Living Family/patient expects to be discharged to:: Private residence Living Arrangements: Alone Available Help at Discharge: Neighbor, Available PRN/intermittently Type of Home: House Home Access: Stairs to enter [employer] of Steps: 6 Entrance Stairs-Rails: Right, Left Home Layout: Two level, Able to live on main level with bedroom/bathroom Bathroom Shower/Tub: Walk-in shower Home Equipment: Environmental consultant - 4 wheels, Shower seat - built in   Functional History: Prior Function Level of Independence: Independent with assistive device(s) Comments: I with rollator. States she normally just scoots around in her rollator while sitting.  "I know I'm not supposed to do that, but I do"  Functional Status:  Mobility: Bed Mobility General bed mobility comments: Pt sitting up EOB upon arrival Transfers Overall transfer level: Needs assistance Equipment used: None Transfers: Stand Pivot Transfers, Squat Pivot Transfers Sit to Stand: Min guard Stand pivot  transfers: Min guard (bed to rollator) Squat pivot transfers: Min guard (rollator to  chair) General transfer comment: working on hand placement and pivot techniques.  Ambulation/Gait General Gait Details: Pt refused ambulation attempts.  Only agreeable to SPT    ADL:    Cognition: Cognition Overall Cognitive Status: Within Functional Limits for tasks assessed Orientation Level: Oriented X4 Cognition Arousal/Alertness: Awake/alert Behavior During Therapy: WFL for tasks assessed/performed Overall Cognitive Status: Within Functional Limits for tasks assessed    Blood pressure 112/43, pulse 76, temperature 98.6 F (37 C), temperature source Oral, resp. rate 18, height 5' 3.3" (1.608 m), weight 77.111 kg (170 lb), SpO2 95 %. Physical Exam  Nursing note and vitals reviewed. Constitutional: She is oriented to person, place, and time. She appears well-developed and well-nourished.  HENT:  Head: Normocephalic and atraumatic.  Mouth/Throat: Oropharynx is clear and moist.  Eyes: Conjunctivae are normal. Pupils are equal, round, and reactive to light.  Neck: Normal range of motion. Neck supple.  Cardiovascular: Normal rate and regular rhythm.   Murmur heard. Respiratory: Effort normal and breath sounds normal. No stridor. No respiratory distress. She has no wheezes.  GI: Soft. Bowel sounds are normal. She exhibits no distension. There is no tenderness.  Musculoskeletal: She exhibits edema (2+ edema LLE--compressions dressing).  Neurological: She is alert and oriented to person, place, and time. No cranial nerve deficit.  UE 5/5. LLE 3/5 prox to distal, left ankle motion limited. RLE 4/5. Sensation diminished distally.   Skin: Skin is warm and dry.  TMA Wound well approximated. Substantial serosang drainage on dressing. Chronic vascular changes on left leg as well as right leg. Right toe amp.    Psychiatric: She has a normal mood and affect. Her behavior is normal.    Results for orders  placed or performed during the hospital encounter of 03/21/16 (from the past 48 hour(s))  Glucose, capillary     Status: None   Collection Time: 03/21/16  4:37 PM  Result Value Ref Range   Glucose-Capillary 79 65 - 99 mg/dL   Comment 1 Notify RN   Glucose, capillary     Status: Abnormal   Collection Time: 03/21/16  8:29 PM  Result Value Ref Range   Glucose-Capillary 170 (H) 65 - 99 mg/dL  Glucose, capillary     Status: Abnormal   Collection Time: 03/22/16  4:24 AM  Result Value Ref Range   Glucose-Capillary 150 (H) 65 - 99 mg/dL  Glucose, capillary     Status: Abnormal   Collection Time: 03/22/16  8:08 AM  Result Value Ref Range   Glucose-Capillary 187 (H) 65 - 99 mg/dL  Glucose, capillary     Status: Abnormal   Collection Time: 03/22/16 11:15 AM  Result Value Ref Range   Glucose-Capillary 199 (H) 65 - 99 mg/dL  Glucose, capillary     Status: Abnormal   Collection Time: 03/22/16  4:37 PM  Result Value Ref Range   Glucose-Capillary 154 (H) 65 - 99 mg/dL   Comment 1 Notify RN   Glucose, capillary     Status: Abnormal   Collection Time: 03/22/16  9:07 PM  Result Value Ref Range   Glucose-Capillary 220 (H) 65 - 99 mg/dL  Glucose, capillary     Status: Abnormal   Collection Time: 03/23/16  5:49 AM  Result Value Ref Range   Glucose-Capillary 208 (H) 65 - 99 mg/dL  Glucose, capillary     Status: Abnormal   Collection Time: 03/23/16 12:09 PM  Result Value Ref Range   Glucose-Capillary 102 (H) 65 - 99 mg/dL  Glucose, capillary  Status: None   Collection Time: 03/23/16  1:21 PM  Result Value Ref Range   Glucose-Capillary 79 65 - 99 mg/dL   No results found.     Medical Problem List and Plan: 1.  Mobility and functional deficits secondary to PAD LLE, s/p TMA 2.  DVT Prophylaxis/Anticoagulation: Pharmaceutical: Lovenox 3. Pain Management: Tylenol prn effective.  4. Mood: LCSW to follow for evaluation and support.  5. Neuropsych: This patient is capable of making decisions  on her own behalf. 6. Skin/Wound Care: Routine pressure relief measures. Elevation when seated for edema control. Continue LLE compression wrap for approx one to tow weeks unless otherwise clinically indicated.  7. Fluids/Electrolytes/Nutrition: Monitor I/O. Check lytes in am.  8. DM type 1 with nephropathy, neuropathy and retinopathy:  Will adjust increase levemir to bid/home regimen for more consistent coverage--patient refusing meal coverage. Monitor BS ac/hs and use SSI for elevated BS.  9. CAD/Chronic systolic CHF/Moderate AS: Monitor for signs of over load with daily weights. Continue Imdur at bedtime with lasix and Lipitor daily. Low salt diet.  10. Pernicious anemia/iron deficiency anemia: 11. GERD:  On Carafate.  12. CKD: Baseline Cr 1.6-1.7 range in the past few months. Recheck lytes in am. Avoid nephrotoxic meds.     Post Admission Physician Evaluation: 1. Functional deficits secondary  to LLE PAD s/p TMA. 2. Patient is admitted to receive collaborative, interdisciplinary care between the physiatrist, rehab nursing staff, and therapy team. 3. Patient's level of medical complexity and substantial therapy needs in context of that medical necessity cannot be provided at a lesser intensity of care such as a SNF. 4. Patient has experienced substantial functional loss from his/her baseline which was documented above under the "Functional History" and "Functional Status" headings.  Judging by the patient's diagnosis, physical exam, and functional history, the patient has potential for functional progress which will result in measurable gains while on inpatient rehab.  These gains will be of substantial and practical use upon discharge  in facilitating mobility and self-care at the household level. 5. Physiatrist will provide 24 hour management of medical needs as well as oversight of the therapy plan/treatment and provide guidance as appropriate regarding the interaction of the two. 6. 24 hour  rehab nursing will assist with bladder management, bowel management, safety, skin/wound care, disease management, medication administration, pain management and patient education  and help integrate therapy concepts, techniques,education, etc. 7. PT will assess and treat for/with: Lower extremity strength, range of motion, stamina, balance, functional mobility, safety, adaptive techniques and equipment, wound mgt, wb precautions, commuity reintegration.   Goals are: mod I. 8. OT will assess and treat for/with: ADL's, functional mobility, safety, upper extremity strength, adaptive techniques and equipment, ego support, community reintegration.   Goals are: mod I. Therapy may proceed with showering this patient if LLE is covered. 9. SLP will assess and treat for/with: n/a.  Goals are: n/a. 10. Case Management and Social Worker will assess and treat for psychological issues and discharge planning. 11. Team conference will be held weekly to assess progress toward goals and to determine barriers to discharge. 12. Patient will receive at least 3 hours of therapy per day at least 5 days per week. 13. ELOS: 5-7 days       14. Prognosis:  excellent     [name], MD, [place] [facility] 03/23/2016   03/23/2016

## 2016-03-23 NOTE — Progress Notes (Signed)
Physical Therapy Treatment Patient Details Name: [name] MRN: [id number] DOB: [birth year] Today's Date: 03/23/2016    History of Present Illness Patient is a 70 year old woman with diabetes peripheral vascular disease who is status post revascularization to the left lower extremity who has progressive gangrenous changes of the forefoot. Pt s/p transmetatarsal aputation of L foot (03/21/16) PMH inlcudes severe CAD, CABG, CHF with EF of 25-30%    PT Comments    Session focused on pivot transfers to/from different surfaces. Currently pt states that she is trying to decide between going to rehab or returning home. Discussed safety considerations and recommending SNF at this time. Pt denies any questions or concerns following session. PT to continue to follow and progress mobility as appropriate.   Follow Up Recommendations  SNF;Supervision for mobility/OOB     Equipment Recommendations  3in1 (PT)    Recommendations for Other Services       Precautions / Restrictions Precautions Precautions: Fall Required Braces or Orthoses: Other Brace/Splint Other Brace/Splint: post op shoe Restrictions Weight Bearing Restrictions: Yes LLE Weight Bearing: Touchdown weight bearing    Mobility  Bed Mobility               General bed mobility comments: Pt sitting up EOB upon arrival  Transfers Overall transfer level: Needs assistance Equipment used: None Transfers: Stand Pivot Transfers;Squat Pivot Transfers Sit to Stand: Min guard Stand pivot transfers: Min guard (bed to rollator) Squat pivot transfers: Min guard (rollator to chair)     General transfer comment: working on hand placement and pivot techniques.   Ambulation/Gait                 Stairs            Wheelchair Mobility    Modified Rankin (Stroke Patients Only)       Balance Overall balance assessment: Needs assistance Sitting-balance support: No upper extremity supported Sitting  balance-Leahy Scale: Good                              Cognition Arousal/Alertness: Awake/alert Behavior During Therapy: WFL for tasks assessed/performed Overall Cognitive Status: Within Functional Limits for tasks assessed                      Exercises      General Comments        Pertinent Vitals/Pain Pain Assessment: Faces Faces Pain Scale: Hurts a little bit Pain Location: stomach Pain Descriptors / Indicators:  (upset) Pain Intervention(s): Monitored during session    Home Living                      Prior Function            PT Goals (current goals can now be found in the care plan section) Acute Rehab PT Goals Patient Stated Goal: wanting to be able to go home.  PT Goal Formulation: With patient Time For Goal Achievement: 03/29/16 Potential to Achieve Goals: Fair Progress towards PT goals: Progressing toward goals    Frequency  Min 3X/week    PT Plan Discharge plan needs to be updated    Co-evaluation             End of Session Equipment Utilized During Treatment: Gait belt Activity Tolerance: Patient tolerated treatment well Patient left: in chair;with call bell/phone within reach     Time: [id number] PT Time Calculation (min) (ACUTE  ONLY): 28 min  Charges:  $Therapeutic Activity: 23-37 mins                    G Codes:      Cassell Clement, PT, CSCS Pager [phone] Office [phone]  03/23/2016, 1:00 PM

## 2016-03-23 NOTE — H&P (Signed)
Physical Medicine and Rehabilitation Admission H&P     CC: Left TMA due to left foot osteomyelitis with gangrene   HPI:  [name] is a 70 y.o.female with history of HTN, CAD s/p CABG, chronic systolic CHF, DM type 1, CKD,  moderate aortic stenosis,  multiple recent syncope episodes, PAD with multiple revascularization procedures, 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 She was admitted on 03/21/16 for left transmet amputation by Dr. Sharol Given. Post op to be NWB on LLE with Darco shoe and noted to be having difficulty with transfers as well as inability to stand. WOC consulted to help with drainage due to venous stasis and 4 layer compression wrap placed on LLE with plans to follow up with Dr. Sharol Given in a week for dressing change. Therapy evaluation done and CIR recommended for follow up therapy.     Review of Systems  Constitutional: Positive for malaise/fatigue.  HENT: Positive for hearing loss.   Eyes: Positive for blurred vision (right eye without peripheral vision and scleral blucking. decreased vision in left t).  Respiratory: Positive for cough (dry cough for past month) and shortness of breath. Negative for wheezing and stridor.        DOE with minimal activity  Cardiovascular: Negative for chest pain.  Gastrointestinal: Positive for nausea (due to cough for past month), diarrhea and constipation. Negative for heartburn and vomiting.  Musculoskeletal: Positive for joint pain (left hip pain due to positioning). Negative for myalgias, back pain and neck pain.  Skin: Positive for itching (chronic intermittent).  Neurological: Positive for tingling, sensory change and weakness. Negative for headaches.  Psychiatric/Behavioral: The patient is nervous/anxious and has insomnia.       Past Medical History  Diagnosis Date  . HTN (hypertension)   . Hypothyroidism   . History of recurrent UTIs   . CAD (coronary artery disease)     s/p  CABG x3 in 1993; last cath in 2010  . Scleroderma ([name])   . Bilateral carpal tunnel syndrome 1970s  . PVD (peripheral vascular disease) ([place])     Toes amputated from left foot and has had prior bypass on left leg. Followed by Dr. Donnetta Hutching  . Claudication ([place])   . Aortic stenosis     0.8cm2 on echo in 10/2011  . Hyperlipidemia     on Lipitor  . Cat bite 2009    with MRSA; required I & D  . Heart murmur   . Blood transfusion     no reaction from transfusion; "when I had heart surgery" (08/28/2012)  . GERD (gastroesophageal reflux disease)   . CHF (congestive heart failure) ([place]) Aug. 2012    Echo 10/2011: Mild LVH, EF 30%, apex akinetic, septal HK, grade 2 diastolic dysfunction, or functionally bicuspid aortic valve, mild aortic stenosis by gradient, severe by calculated AVA-suspect A. Aortic stenosis probably moderate, trivial MR, mild LAE, mild RAE.   [name] Cellulitis   . Anemia   . B12 deficiency anemia     B 12 injection "monthly since 05/2012" (08/28/2012)  . Iron deficiency anemia 06/13/2012    "iron infusion" (08/28/2012  . Anginal pain ([place])     "history; not currently" (08/28/2012)  . Myocardial infarction [facility]) 1986    "silent"  . History of bronchitis     "I've had it twice in my life" (08/28/2012)  . Exertional dyspnea     "because of the heart failure" (08/28/2012)  . Type 1 diabetes ([place]) 10/1949  .  Stomach ulcer 1981?    "on Tagament for ~ 1 yr" (08/28/2012)  . Seizure ([place])     ? seizure like event in 2010; workup included stress testing which led to repeat cath.   . Arthritis     "hands, hips, all over" (08/28/2012  . Osteoarthritis of both feet   . Breast cancer ([place])     left  . Cellulitis June 2013, Nov. 2013 and Feb. 14, 2014    Left leg  . Osteomyelitis ([place])     left foot    Past Surgical History  Procedure Laterality Date  . Carpal tunnel release  ~ 1970's    bilaterally  . Mastectomy  11/1982    Left Breast  . Vitrectomy  1990's-2004    "both eyes;  I've had total of 4"  . Tubal ligation  1984  . Tonsillectomy  1952    "?adenoids"   . Appendectomy  1991  . Femoral bypass      left leg "related to transmetatarsal amputation" (08/28/2012)  . Breast biopsy with sentinel lymph node biopsy and needle localization  1984  . Coronary artery bypass graft  1992    LIMA to LAD, SVG to distal LCX & SVG to Marginal  . Cardiac catheterization  2010    LIMA to LAD patent yet with occluded distal LAD after graft insertion & retrograde is occluded. 3-4 septal perforators arise &are patent. SVG to a large marginal patent; SVG presumably to the distal dominant LCX is occluded, moderately high grade stenosis of the AV circumflex, but with distal stenoses involving a severely diseased marginal branch not amenable  to PCI  nor is inferior branch   . Cardiac catheterization  10/2011    "unsuccessful attempt of stenting" (08/28/2012)  . Cardiac catheterization  1992; 09/2011  . Breast biopsy    . Trigger finger release  1980's    right thumb  . Incision and drainage of wound  ~ 1967    "infection in left foot" (08/28/2012)  . Incision and drainage of wound  2009    "3 ORs on my right hand after cat bite" (08/28/2012)  . 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 Cataract extraction w/ intraocular lens  implant, bilateral  1990's  . Amputation Right 05/27/2013    Procedure: AMPUTATION DIGIT FIFTH TOE;  Surgeon: Rosetta Posner, MD;  Location: [facility];  Service: Vascular;  Laterality: Right;  . Left and right heart catheterization with coronary angiogram N/A 09/17/2011    Procedure: LEFT AND RIGHT HEART CATHETERIZATION WITH CORONARY ANGIOGRAM;  Surgeon: Hillary Bow, MD;  Location: [facility] CATH LAB;  Service: Cardiovascular;  Laterality: N/A;  . Percutaneous coronary stent intervention (pci-s) N/A 11/15/2011    Procedure: PERCUTANEOUS CORONARY STENT INTERVENTION (PCI-S);  Surgeon: Hillary Bow, MD;  Location: [facility] CATH LAB;  Service:  Cardiovascular;  Laterality: N/A;  . Lower extremity angiogram N/A 05/25/2013    Procedure: LOWER EXTREMITY ANGIOGRAM POSS INTERVENTION;  Surgeon: Conrad Nina, MD;  Location: [facility] C[facility] LAB;  Service: Cardiovascular;  Laterality: N/A;  . Amputation Left 03/21/2016    Procedure: Left Transmetatarsal Amputation;  Surgeon: Newt Minion, MD;  Location: [facility];  Service: Orthopedics;  Laterality: Left;    Family History  Problem Relation Age of Onset  . Diabetes Mother   . Heart attack Neg Hx   . Stroke Mother   . Stroke Father   . Hypertension Mother  Social History:   Divorced --Lives alone. 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  Has a cousin in town and neighbor check in on her. Has been non-ambulatory and gets around in her rollater. She reports that she has never smoked. She has never used smokeless tobacco. She reports that she does not drink alcohol or use illicit drugs.    Allergies  Allergen Reactions  . Cephalexin Swelling and Rash    No specific area  . Ciprofloxacin Swelling and Rash    No specific area  . Zyvox [Linezolid] Other (See Comments)    Thrombocytopenia developed to 50k after several weeks of therapy  . Adhesive [Tape] Other (See Comments)    "old Johnson/Johnson adhesive tape; takes my skin off; can use paper tape"  . Bactrim [Sulfamethoxazole-Trimethoprim] Other (See Comments)    Hyperkalemia and Increased creatinine  . Daptomycin Other (See Comments)    Had elevated CPK on therapy, not clear that this was due to cubicin  . Vancomycin Other (See Comments)    ? If this played role in her Acute kidney injury    Medications Prior to Admission  Medication Sig Dispense Refill  . aspirin EC 325 MG EC tablet Take 325 mg by mouth daily.     [name] atorvastatin (LIPITOR) 40 MG tablet TAKE 1 TABLET BY MOUTH AT BEDTIME 30 tablet 11  . docusate sodium (COLACE) 50 MG capsule Take 50 mg by mouth at bedtime as needed (For constipation.).    [name] furosemide  (LASIX) 20 MG tablet Alternate 3 pills (60 mg total) with 5 pills (100 mg total) every other day until next office visit.. (Patient taking differently: Take 60-100 mg by mouth every other day. Alternate 3 pills (60 mg total) with 5 pills (100 mg total) every other day until next office visit.[name]) 120 tablet 1  . insulin detemir (LEVEMIR) 100 UNIT/ML injection Inject 12-24 Units into the skin 2 (two) times daily. Inject 24 units subutaneously every morning (10am)  and 12 units at bedtime (10pm)    . insulin lispro (HUMALOG) 100 UNIT/ML injection Inject 1-12 Units into the skin 4 (four) times daily as needed for high blood sugar (sliding scale).     . isosorbide mononitrate (IMDUR) 30 MG 24 hr tablet Take 30 mg by mouth at bedtime.    [name] levothyroxine (SYNTHROID, LEVOTHROID) 100 MCG tablet Take 100 mcg by mouth daily before breakfast.   5  . Cyanocobalamin 1000 MCG/ML KIT Inject 1,000 mcg into the skin every 8 (eight) weeks.       Home: Home Living Family/patient expects to be discharged to:: Private residence Living Arrangements: Alone Available Help at Discharge: Neighbor, Available PRN/intermittently Type of Home: House Home Access: Stairs to enter [employer] of Steps: 6 Entrance Stairs-Rails: Right, Left Home Layout: Two level, Able to live on main level with bedroom/bathroom Bathroom Shower/Tub: Walk-in shower Home Equipment: Environmental consultant - 4 wheels, Shower seat - built in   Functional History: Prior Function Level of Independence: Independent with assistive device(s) Comments: I with rollator. States she normally just scoots around in her rollator while sitting.  "I know I'm not supposed to do that, but I do"  Functional Status:  Mobility: Bed Mobility General bed mobility comments: Pt sitting up EOB upon arrival Transfers Overall transfer level: Needs assistance Equipment used: None Transfers: Stand Pivot Transfers, Squat Pivot Transfers Sit to Stand: Min guard Stand pivot  transfers: Min guard (bed to rollator) Squat pivot transfers: Min guard (rollator to  chair) General transfer comment: working on hand placement and pivot techniques.  Ambulation/Gait General Gait Details: Pt refused ambulation attempts.  Only agreeable to SPT    ADL:    Cognition: Cognition Overall Cognitive Status: Within Functional Limits for tasks assessed Orientation Level: Oriented X4 Cognition Arousal/Alertness: Awake/alert Behavior During Therapy: WFL for tasks assessed/performed Overall Cognitive Status: Within Functional Limits for tasks assessed    Blood pressure 112/43, pulse 76, temperature 98.6 F (37 C), temperature source Oral, resp. rate 18, height 5' 3.3" (1.608 m), weight 77.111 kg (170 lb), SpO2 95 %. Physical Exam  Nursing note and vitals reviewed. Constitutional: She is oriented to person, place, and time. She appears well-developed and well-nourished.  HENT:  Head: Normocephalic and atraumatic.  Mouth/Throat: Oropharynx is clear and moist.  Eyes: Conjunctivae are normal. Pupils are equal, round, and reactive to light.  Neck: Normal range of motion. Neck supple.  Cardiovascular: Normal rate and regular rhythm.   Murmur heard. Respiratory: Effort normal and breath sounds normal. No stridor. No respiratory distress. She has no wheezes.  GI: Soft. Bowel sounds are normal. She exhibits no distension. There is no tenderness.  Musculoskeletal: She exhibits edema (2+ edema LLE--compressions dressing).  Neurological: She is alert and oriented to person, place, and time. No cranial nerve deficit.  UE 5/5. LLE 3/5 prox to distal, left ankle motion limited. RLE 4/5. Sensation diminished distally.   Skin: Skin is warm and dry.  TMA Wound well approximated. Substantial serosang drainage on dressing. Chronic vascular changes on left leg as well as right leg. Right toe amp.    Psychiatric: She has a normal mood and affect. Her behavior is normal.    Results for orders  placed or performed during the hospital encounter of 03/21/16 (from the past 48 hour(s))  Glucose, capillary     Status: None   Collection Time: 03/21/16  4:37 PM  Result Value Ref Range   Glucose-Capillary 79 65 - 99 mg/dL   Comment 1 Notify RN   Glucose, capillary     Status: Abnormal   Collection Time: 03/21/16  8:29 PM  Result Value Ref Range   Glucose-Capillary 170 (H) 65 - 99 mg/dL  Glucose, capillary     Status: Abnormal   Collection Time: 03/22/16  4:24 AM  Result Value Ref Range   Glucose-Capillary 150 (H) 65 - 99 mg/dL  Glucose, capillary     Status: Abnormal   Collection Time: 03/22/16  8:08 AM  Result Value Ref Range   Glucose-Capillary 187 (H) 65 - 99 mg/dL  Glucose, capillary     Status: Abnormal   Collection Time: 03/22/16 11:15 AM  Result Value Ref Range   Glucose-Capillary 199 (H) 65 - 99 mg/dL  Glucose, capillary     Status: Abnormal   Collection Time: 03/22/16  4:37 PM  Result Value Ref Range   Glucose-Capillary 154 (H) 65 - 99 mg/dL   Comment 1 Notify RN   Glucose, capillary     Status: Abnormal   Collection Time: 03/22/16  9:07 PM  Result Value Ref Range   Glucose-Capillary 220 (H) 65 - 99 mg/dL  Glucose, capillary     Status: Abnormal   Collection Time: 03/23/16  5:49 AM  Result Value Ref Range   Glucose-Capillary 208 (H) 65 - 99 mg/dL  Glucose, capillary     Status: Abnormal   Collection Time: 03/23/16 12:09 PM  Result Value Ref Range   Glucose-Capillary 102 (H) 65 - 99 mg/dL  Glucose, capillary  Status: None   Collection Time: 03/23/16  1:21 PM  Result Value Ref Range   Glucose-Capillary 79 65 - 99 mg/dL   No results found.     Medical Problem List and Plan: 1.  Mobility and functional deficits secondary to PAD LLE, s/p TMA 2.  DVT Prophylaxis/Anticoagulation: Pharmaceutical: Lovenox 3. Pain Management: Tylenol prn effective.  4. Mood: LCSW to follow for evaluation and support.  5. Neuropsych: This patient is capable of making decisions  on her own behalf. 6. Skin/Wound Care: Routine pressure relief measures. Elevation when seated for edema control. Continue LLE compression wrap for approx one to tow weeks unless otherwise clinically indicated.  7. Fluids/Electrolytes/Nutrition: Monitor I/O. Check lytes in am.  8. DM type 1 with nephropathy, neuropathy and retinopathy:  Will adjust increase levemir to bid/home regimen for more consistent coverage--patient refusing meal coverage. Monitor BS ac/hs and use SSI for elevated BS.  9. CAD/Chronic systolic CHF/Moderate AS: Monitor for signs of over load with daily weights. Continue Imdur at bedtime with lasix and Lipitor daily. Low salt diet.  10. Pernicious anemia/iron deficiency anemia: 11. GERD:  On Carafate.  12. CKD: Baseline Cr 1.6-1.7 range in the past few months. Recheck lytes in am. Avoid nephrotoxic meds.     Post Admission Physician Evaluation: 1. Functional deficits secondary  to LLE PAD s/p TMA. 2. Patient is admitted to receive collaborative, interdisciplinary care between the physiatrist, rehab nursing staff, and therapy team. 3. Patient's level of medical complexity and substantial therapy needs in context of that medical necessity cannot be provided at a lesser intensity of care such as a SNF. 4. Patient has experienced substantial functional loss from his/her baseline which was documented above under the "Functional History" and "Functional Status" headings.  Judging by the patient's diagnosis, physical exam, and functional history, the patient has potential for functional progress which will result in measurable gains while on inpatient rehab.  These gains will be of substantial and practical use upon discharge  in facilitating mobility and self-care at the household level. 5. Physiatrist will provide 24 hour management of medical needs as well as oversight of the therapy plan/treatment and provide guidance as appropriate regarding the interaction of the two. 6. 24 hour  rehab nursing will assist with bladder management, bowel management, safety, skin/wound care, disease management, medication administration, pain management and patient education  and help integrate therapy concepts, techniques,education, etc. 7. PT will assess and treat for/with: Lower extremity strength, range of motion, stamina, balance, functional mobility, safety, adaptive techniques and equipment, wound mgt, wb precautions, commuity reintegration.   Goals are: mod I. 8. OT will assess and treat for/with: ADL's, functional mobility, safety, upper extremity strength, adaptive techniques and equipment, ego support, community reintegration.   Goals are: mod I. Therapy may proceed with showering this patient if LLE is covered. 9. SLP will assess and treat for/with: n/a.  Goals are: n/a. 10. Case Management and Social Worker will assess and treat for psychological issues and discharge planning. 11. Team conference will be held weekly to assess progress toward goals and to determine barriers to discharge. 12. Patient will receive at least 3 hours of therapy per day at least 5 days per week. 13. ELOS: 5-7 days       14. Prognosis:  excellent     [name], MD, [place] [facility] 03/23/2016   03/23/2016

## 2016-03-23 NOTE — Progress Notes (Signed)
Pt discharge education and instructions completed. Pt discharge to IP Rehab and report called off to RN Maudry Mayhew on 4MW. Pt IV removed upon pt request. Pt VSS; Left foot dsg remains clean, dry and intact; orthopedic shoe remains on foot; pt transported off unit via wheelchair with belongings to the side. Delia Heady RN

## 2016-03-23 NOTE — Progress Notes (Signed)
Inpatient Diabetes Program Recommendations  AACE/ADA: New Consensus Statement on Inpatient Glycemic Control (2015)  Target Ranges:  Prepandial:   less than 140 mg/dL      Peak postprandial:   less than 180 mg/dL (1-2 hours)      Critically ill patients:  140 - 180 mg/dL   Lab Results  Component Value Date   GLUCAP 208* 03/23/2016   HGBA1C 10.0 11/17/2013    Review of Glycemic Control  Diabetes history: yes Outpatient Diabetes medications: Levemir 24 units am and 12 units pm Current orders for Inpatient glycemic control: Levemir 10 units   Inpatient Diabetes Program Recommendations:  Insulin - Basal: Increase Levemir to 24 units Thank you  Raoul Pitch MSN, RN,CDE Inpatient Diabetes Coordinator [phone] (team pager)

## 2016-03-24 ENCOUNTER — Inpatient Hospital Stay (HOSPITAL_COMMUNITY): Payer: Medicare Other | Admitting: Physical Therapy

## 2016-03-24 ENCOUNTER — Inpatient Hospital Stay (HOSPITAL_COMMUNITY): Payer: Medicare Other

## 2016-03-24 LAB — CBC WITH DIFFERENTIAL/PLATELET
BASOS PCT: 0 %
Basophils Absolute: 0 10*3/uL (ref 0.0–0.1)
EOS ABS: 0.2 10*3/uL (ref 0.0–0.7)
EOS PCT: 4 %
HCT: 32.6 % — ABNORMAL LOW (ref 36.0–46.0)
Hemoglobin: 10.3 g/dL — ABNORMAL LOW (ref 12.0–15.0)
Lymphocytes Relative: 14 %
Lymphs Abs: 0.8 10*3/uL (ref 0.7–4.0)
MCH: 26.8 pg (ref 26.0–34.0)
MCHC: 31.6 g/dL (ref 30.0–36.0)
MCV: 84.7 fL (ref 78.0–100.0)
MONO ABS: 0.9 10*3/uL (ref 0.1–1.0)
MONOS PCT: 16 %
Neutro Abs: 3.8 10*3/uL (ref 1.7–7.7)
Neutrophils Relative %: 66 %
PLATELETS: 169 10*3/uL (ref 150–400)
RBC: 3.85 MIL/uL — ABNORMAL LOW (ref 3.87–5.11)
RDW: 15.2 % (ref 11.5–15.5)
WBC: 5.8 10*3/uL (ref 4.0–10.5)

## 2016-03-24 LAB — BASIC METABOLIC PANEL
Anion gap: 8 (ref 5–15)
BUN: 43 mg/dL — AB (ref 6–20)
CHLORIDE: 100 mmol/L — AB (ref 101–111)
CO2: 26 mmol/L (ref 22–32)
CREATININE: 1.87 mg/dL — AB (ref 0.44–1.00)
Calcium: 9.3 mg/dL (ref 8.9–10.3)
GFR calc non Af Amer: 26 mL/min — ABNORMAL LOW (ref >60)
GFR, EST AFRICAN AMERICAN: 30 mL/min — AB (ref >60)
Glucose, Bld: 82 mg/dL (ref 65–99)
Potassium: 4.5 mmol/L (ref 3.5–5.1)
SODIUM: 134 mmol/L — AB (ref 135–145)

## 2016-03-24 LAB — GLUCOSE, CAPILLARY
GLUCOSE-CAPILLARY: 217 mg/dL — AB (ref 65–99)
GLUCOSE-CAPILLARY: 136 mg/dL — AB (ref 65–99)
GLUCOSE-CAPILLARY: 197 mg/dL — AB (ref 65–99)
Glucose-Capillary: 114 mg/dL — ABNORMAL HIGH (ref 65–99)

## 2016-03-24 LAB — BRAIN NATRIURETIC PEPTIDE: B Natriuretic Peptide: 562.3 pg/mL — ABNORMAL HIGH (ref 0.0–100.0)

## 2016-03-24 MED ORDER — INSULIN DETEMIR 100 UNIT/ML ~~LOC~~ SOLN
12.0000 [IU] | Freq: Two times a day (BID) | SUBCUTANEOUS | Status: DC
  Administered 2016-03-24: 10 [IU] via SUBCUTANEOUS
  Filled 2016-03-24 (×3): qty 0.12

## 2016-03-24 MED ORDER — FUROSEMIDE 40 MG PO TABS
40.0000 mg | ORAL_TABLET | ORAL | Status: AC
  Administered 2016-03-24: 40 mg via ORAL
  Filled 2016-03-24: qty 1

## 2016-03-24 NOTE — Evaluation (Signed)
Physical Therapy Assessment and Plan  Patient Details  Name: [name] MRN: [id number] Date of Birth: Feb 10, 1946  PT Diagnosis: Difficulty walking and Muscle weakness Rehab Potential: Fair ELOS: 7-10 days    Today's Date: 03/24/2016 PT Individual Time: 1330-1431 PT Individual Time Calculation (min): 61 min    Problem List:  Patient Active Problem List   Diagnosis Date Noted  . PAD (peripheral artery disease) ([place]) 03/23/2016  . Status post transmetatarsal amputation of left foot ([place]) 03/21/2016  . Pain in limb 03/02/2014  . Skin tear 11/17/2013  . PVD (peripheral vascular disease) ([place]) 09/22/2013  . Aftercare following surgery of the circulatory system, [place] 06/09/2013  . Atherosclerosis of native arteries of the extremities with ulceration(440.23) 06/09/2013  . Diabetic osteomyelitis ([place]) 06/02/2013  . 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 Abnormality of gait 01/21/2013  . CKD (chronic kidney disease), stage III 11/29/2012  . Chronic combined systolic and diastolic heart failure ([place]) 04/24/2012  . Health care maintenance 04/24/2012  . Iron deficiency anemia 04/15/2012  . Vitamin B12 deficiency (dietary) anemia 04/15/2012  . Aortic stenosis 08/22/2011  . ACQUIRED HEMOLYTIC ANEMIA UNSPECIFIED 06/06/2009  . CAD 02/03/2009  . CAROTID ARTERY STENOSIS, WITHOUT INFARCTION 02/03/2009  . Peripheral artery disease 02/03/2009  . Unspecified hypothyroidism 02/01/2009  . DYSLIPIDEMIA 02/01/2009  . Essential hypertension 02/01/2009  . DM (diabetes mellitus) type I uncontrolled with renal manifestation ([place]) 10/15/1949    Past Medical History:  Past Medical History  Diagnosis Date  . HTN (hypertension)   . Hypothyroidism   . History of recurrent UTIs   . CAD (coronary artery disease)     s/p CABG x3 in 1993; last cath in 2010  . Scleroderma ([place])   . Bilateral carpal tunnel syndrome 1970s  . PVD (peripheral vascular disease) ([place])      Toes amputated from left foot and has had prior bypass on left leg. Followed by Dr. Donnetta Hutching  . Claudication ([place])   . Aortic stenosis     0.8cm2 on echo in 10/2011  . Hyperlipidemia     on Lipitor  . Cat bite 2009    with MRSA; required I & D  . Heart murmur   . Blood transfusion     no reaction from transfusion; "when I had heart surgery" (08/28/2012)  . GERD (gastroesophageal reflux disease)   . CHF (congestive heart failure) ([place]) Aug. 2012    Echo 10/2011: Mild LVH, EF 30%, apex akinetic, septal HK, grade 2 diastolic dysfunction, or functionally bicuspid aortic valve, mild aortic stenosis by gradient, severe by calculated AVA-suspect A. Aortic stenosis probably moderate, trivial MR, mild LAE, mild RAE.   [name] Cellulitis   . Anemia   . B12 deficiency anemia     B 12 injection "monthly since 05/2012" (08/28/2012)  . Iron deficiency anemia 06/13/2012    "iron infusion" (08/28/2012  . Anginal pain ([place])     "history; not currently" (08/28/2012)  . Myocardial infarction [facility]) 1986    "silent"  . History of bronchitis     "I've had it twice in my life" (08/28/2012)  . Exertional dyspnea     "because of the heart failure" (08/28/2012)  . Type 1 diabetes ([place]) 10/1949  . Stomach ulcer 1981?    "on Tagament for ~ 1 yr" in 49s  . Seizure ([place])     ? seizure like event in 2010; workup included stress testing which led to repeat cath.   . Arthritis     "  hands, hips, all over" (08/28/2012  . Osteoarthritis of both feet   . Breast cancer ([place])     left  . Cellulitis June 2013, Nov. 2013 and Feb. 14, 2014    Left leg  . Osteomyelitis ([place])     left foot   Past Surgical History:  Past Surgical History  Procedure Laterality Date  . Carpal tunnel release  ~ 1970's    bilaterally  . Mastectomy  11/1982    Left Breast  . Vitrectomy  1990's-2004    "both eyes; I've had total of 4"  . Tubal ligation  1984  . Tonsillectomy  1952    "?adenoids"   . Appendectomy  1991  . Femoral bypass       left leg "related to transmetatarsal amputation" (08/28/2012)  . Breast biopsy with sentinel lymph node biopsy and needle localization  1984  . Coronary artery bypass graft  1992    LIMA to LAD, SVG to distal LCX & SVG to Marginal  . Cardiac catheterization  2010    LIMA to LAD patent yet with occluded distal LAD after graft insertion & retrograde is occluded. 3-4 septal perforators arise &are patent. SVG to a large marginal patent; SVG presumably to the distal dominant LCX is occluded, moderately high grade stenosis of the AV circumflex, but with distal stenoses involving a severely diseased marginal branch not amenable  to PCI  nor is inferior branch   . Cardiac catheterization  10/2011    "unsuccessful attempt of stenting" (08/28/2012)  . Cardiac catheterization  1992; 09/2011  . Breast biopsy    . Trigger finger release  1980's    right thumb  . Incision and drainage of wound  ~ 1967    "infection in left foot" (08/28/2012)  . Incision and drainage of wound  2009    "3 ORs on my right hand after cat bite" (08/28/2012)  . 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 Cataract extraction w/ intraocular lens  implant, bilateral  1990's  . Amputation Right 05/27/2013    Procedure: AMPUTATION DIGIT FIFTH TOE;  Surgeon: Rosetta Posner, MD;  Location: [facility];  Service: Vascular;  Laterality: Right;  . Left and right heart catheterization with coronary angiogram N/A 09/17/2011    Procedure: LEFT AND RIGHT HEART CATHETERIZATION WITH CORONARY ANGIOGRAM;  Surgeon: Hillary Bow, MD;  Location[facility] C[facility] LAB;  Service: Cardiovascular;  Laterality: N/A;  . Percutaneous coronary stent intervention (pci-s) N/A 11/15/2011    Procedure: PERCUTANEOUS CORONARY STENT INTERVENTION (PCI-S);  Surgeon: Hillary Bow, MD;  Location: [facility] CATH LAB;  Service: Cardiovascular;  Laterality: N/A;  . Lower extremity angiogram N/A 05/25/2013    Procedure: LOWER EXTREMITY ANGIOGRAM POSS  INTERVENTION;  Surgeon: Conrad Castleton-on-Hudson, MD;  Location: [facility] CATH LAB;  Service: Cardiovascular;  Laterality: N/A;  . Amputation Left 03/21/2016    Procedure: Left Transmetatarsal Amputation;  Surgeon: Newt Minion, MD;  Location: [facility];  Service: Orthopedics;  Laterality: Left;    Assessment & Plan Clinical Impression: Patient is a20 y.o.female with history of HTN, CAD s/p CABG, chronic systolic CHF, DM type 1, CKD, moderate aortic stenosis, multiple recent syncope episodes, PAD with multiple revascularization procedures, 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 She was admitted on 03/21/16 for left transmet amputation by Dr. Sharol Given. Post op to be NWB on LLE with Darco shoe and noted to be having difficulty with transfers as  well as inability to stand. WOC consulted to help with drainage due to venous stasis and 4 layer compression wrap placed on LLE with plans to follow up with Dr. Sharol Given in a week for dressing change. Patient transferred to CIR on 03/23/2016 .   Patient currently requires min with mobility secondary to muscle weakness, decreased cardiorespiratoy endurance and decreased standing balance, decreased balance strategies and difficulty maintaining precautions.  Prior to hospitalization, patient was modified independent  with mobility and lived with Alone in a House home.  Home access is 6Stairs to enter.  Patient will benefit from skilled PT intervention to maximize safe functional mobility, minimize fall risk and decrease caregiver burden for planned discharge home with intermittent assist.  Anticipate patient will benefit from follow up [facility] at discharge.  PT - End of Session Activity Tolerance: Tolerates 10 - 20 min activity with multiple rests Endurance Deficit: Yes PT Assessment Rehab Potential (ACUTE/IP ONLY): Fair Barriers to Discharge: [place] home environment;Decreased caregiver support Barriers to Discharge Comments: Minimal assistant at  home. 5 Steps to enter with 1 rail PT Patient demonstrates impairments in the following area(s): Balance;Endurance;Edema;Pain;Safety PT Transfers Functional Problem(s): Bed Mobility;Bed to Chair;Furniture;Floor;Car PT Locomotion Functional Problem(s): Ambulation;Stairs;Wheelchair Mobility PT Plan PT Intensity: Minimum of 1-2 x/day ,45 to 90 minutes PT Frequency: 5 out of 7 days PT Duration Estimated Length of Stay: 7-10 days  PT Treatment/Interventions: Ambulation/gait training;Balance/vestibular training;Cognitive remediation/compensation;Community reintegration;Discharge planning;Disease management/prevention;DME/adaptive equipment instruction;Functional mobility training;Neuromuscular re-education;Pain management;Patient/family education;Psychosocial support;Skin care/wound management;Stair training;Therapeutic Activities;Therapeutic Exercise;UE/LE Strength taining/ROM;Splinting/orthotics;UE/LE Coordination activities;Wheelchair propulsion/positioning;Visual/perceptual remediation/compensation PT Transfers Anticipated Outcome(s): mod I with LRAD PT Locomotion Anticipated Outcome(s): Supervision with RW for house hold distancecs. WC for community.  PT Recommendation Follow Up Recommendations: Home health PT Patient destination: Home Equipment Recommended: Rolling walker with 5" wheels  Skilled Therapeutic Intervention PT performed evaluation and initiated treatment intervention; see below for results.   Patient educated in the difference between TTWB and NWB based on orders from MD. sqaut pivot and stand pivot with RW transfers performed with min A from PT. Patient was unable to maintain NWB status with squat pivot transfer.  Car transfer performed with stand pivot and RW with min A from PT with mod cues for sequencing and UE placement.   Following treatment; patient returned to room and left sitting in recliner with call bell within reach.   PT  Evaluation Precautions/Restrictions Precautions Precautions: Fall Required Braces or Orthoses: Other Brace/Splint Other Brace/Splint: post op shoe Restrictions Weight Bearing Restrictions: Yes LLE Weight Bearing: Non weight bearing General Chart Reviewed: Yes Additional Pertinent History: PVD, DM II, CAD, CABG, CKD  Family/Caregiver Present: No Vital Signs Pain Pain Assessment Pain Assessment: No/denies pain Pain Score: 0-No pain Home Living/Prior Functioning Home Living Available Help at Discharge: Neighbor;Available PRN/intermittently Type of Home: House Home Access: Stairs to enter [employer] of Steps: 6 Entrance Stairs-Rails: Right;Left Home Layout: Two level;Able to live on main level with bedroom/bathroom Alternate Level Stairs-Rails: Right;Left Bathroom Shower/Tub: Multimedia programmer: Standard Bathroom Accessibility: Yes Additional Comments: Steps in garage have higher rise; front steps are easier to ascend  Lives With: Alone Prior Function Level of Independence: Independent with basic ADLs;Other (comment) (CHF limited; pays for heavy work assist.)  Able to Take Stairs?: Yes Driving: Yes Vocation: Retired Tax adviser Overall Cognitive Status: Within Functional Limits for tasks assessed Arousal/Alertness: Awake/alert Attention: Alternating Awareness: Appears intact Problem Solving: Appears intact Safety/Judgment: Impaired Comments: Patient noted to be very fixed in approach to mobility, resistant  to reccomendations from PT.  Sensation   Motor    WFL  Mobility Bed Mobility Bed Mobility: Rolling Right;Rolling Left;Supine to Sit;Sit to Supine Rolling Right: 5: Supervision Rolling Left: 5: Supervision Supine to Sit: 5: Supervision Sit to Supine: 5: Supervision Transfers Transfers: Yes Sit to Stand: 4: Min assist Stand Pivot Transfers: 4: Min assist Stand Pivot Transfer Details: Tactile cues for posture;Tactile  cues for placement;Tactile cues for weight beaing;Verbal cues for technique;Verbal cues for sequencing;Verbal cues for safe use of DME/AE;Verbal cues for precautions/safety;Manual facilitation for placement Squat Pivot Transfers: 4: Min guard (patient unable to maintain NWB status on LLE with squat pivot. ) Squat Pivot Transfer Details: Verbal cues for sequencing;Verbal cues for technique;Verbal cues for precautions/safety;Tactile cues for placement;Verbal cues for safe use of DME/AE Locomotion  Ambulation Ambulation: Yes Ambulation/Gait Assistance: 4: Min assist Ambulation Distance (Feet): 5 Feet Assistive device: Rolling walker Ambulation/Gait Assistance Details: Tactile cues for sequencing;Tactile cues for weight shifting;Tactile cues for posture;Tactile cues for placement;Tactile cues for weight beaing;Visual cues for safe use of DME/AE;Visual cues/gestures for precautions/safety;Visual cues/gestures for sequencing;Verbal cues for sequencing;Verbal cues for technique;Verbal cues for precautions/safety;Verbal cues for gait pattern;Verbal cues for safe use of DME/AE;Manual facilitation for weight shifting Gait Gait: Yes Gait Pattern: Impaired Gait Pattern: Step-to pattern Stairs / Additional Locomotion Stairs: No Wheelchair Mobility Wheelchair Mobility: Yes Wheelchair Assistance: 5: Careers information officer: Both upper extremities Wheelchair Parts Management: Needs assistance Distance: 61f  Trunk/Postural Assessment  Cervical Assessment Cervical Assessment: Within Functional Limits Thoracic Assessment Thoracic Assessment: Within Functional Limits Lumbar Assessment Lumbar Assessment: Within Functional Limits Postural Control Postural Control: Within Functional Limits  Balance Balance Balance Assessed: Yes Static Sitting Balance Static Sitting - Level of Assistance: 6: Modified independent (Device/Increase time) Dynamic Sitting Balance Dynamic Sitting - Level of  Assistance: 6: Modified independent (Device/Increase time) Static Standing Balance Static Standing - Level of Assistance: 5: Stand by assistance (BUE support ) Dynamic Standing Balance Dynamic Standing - Level of Assistance: 4: Min assist (BUE support. ) Extremity Assessment      RLE Assessment RLE Assessment: Within Functional Limits (pain with MMT due to pitting edema) LLE Assessment LLE Assessment: [facility](pain with MMT due to pitting edema and 4/5 throughout . )   See Function Navigator for Current Functional Status.   Refer to Care Plan for Long Term Goals  Recommendations for other services: None  Discharge Criteria: Patient will be discharged from PT if patient refuses treatment 3 consecutive times without medical reason, if treatment goals not met, if there is a change in medical status, if patient makes no progress towards goals or if patient is discharged from hospital.  The above assessment, treatment plan, treatment alternatives and goals were discussed and mutually agreed upon: by patient  ALorie Phenix6/07/2016, 5:30 PM

## 2016-03-24 NOTE — Progress Notes (Signed)
Nutrition Brief Note  RD consulted with comment "Do not send supplements-she dislikes it"  RD stopped by to talk with pt to receive more detail about consult. She says she has not received any supplements, but just does not want to receive any. Informed her that she should not receive any unless they are ordered, which they arent. This was not an appropriate consult.    While present, inquired how she was eating overall. She reports a poor appetite this past month due to cough. Her UBW is 160-165-closer to 165 lbs now due to swelling. She is above this now.   She says she does not like meat. Emphasized importance of protein for her amputation healing. She says she drinks milk and eats cheese/legumes for her protein. She was agreeable to trying a magic cup as well.   Current diet order is Carb Mod, patient is consuming approximately 50-100% of meals at this time.   No other nutrition interventions warranted at this time.  Will complete consult  Burtis Junes RD, LDN, CNSC Clinical Nutrition Pager: [phone] 03/24/2016 11:13 AM

## 2016-03-24 NOTE — Progress Notes (Signed)
[facility] PHYSICAL MEDICINE & REHABILITATION     PROGRESS NOTE    Subjective/Complaints: Woke up at 0430 with cough. Couldn't go back to sleep. Feels she's fluid overloaded. Pain controlled.   ROS: Pt denies fever, rash/itching, headache, blurred or double vision, nausea, vomiting, abdominal pain, diarrhea, chest pain, shortness of breath, palpitations, dysuria, dizziness, neck or back pain, bleeding, anxiety, or depression   Objective: Vital Signs: Blood pressure 131/58, pulse 73, temperature 98.3 F (36.8 C), temperature source Oral, resp. rate 18, weight 78.382 kg (172 lb 12.8 oz), SpO2 97 %. No results found.  Recent Labs  03/24/16 0428  WBC 5.8  HGB 10.3*  HCT 32.6*  PLT 169    Recent Labs  03/23/16 1704  NA 129*  K 4.5  CL 97*  GLUCOSE 188*  BUN 42*  CREATININE 1.83*  CALCIUM 9.0   CBG (last 3)   Recent Labs  03/23/16 1641 03/23/16 2039 03/24/16 0639  GLUCAP 172* 266* 217*    Wt Readings from Last 3 Encounters:  03/24/16 78.382 kg (172 lb 12.8 oz)  03/21/16 77.111 kg (170 lb)  03/19/16 77.474 kg (170 lb 12.8 oz)    Physical Exam:  Constitutional: She is oriented to person, place, and time. She appears well-developed and well-nourished.  HENT:  Head: Normocephalic and atraumatic.  Mouth/Throat: Oropharynx is clear and moist.  Eyes: Conjunctivae are normal. Pupils are equal, round, and reactive to light.  Neck: Normal range of motion. Neck supple.  Cardiovascular: Normal rate and regular rhythm.  Murmur heard. Respiratory: Effort normal and breath sounds normal. No stridor. No respiratory distress. She has no wheezes.  GI: Soft. Bowel sounds are normal. She exhibits no distension. There is no tenderness.  Musculoskeletal: She exhibits edema (2+ edema LLE--compressions dressing).  Neurological: She is alert and oriented to person, place, and time. No cranial nerve deficit.  UE 5/5. LLE 3/5 prox to distal, left ankle motion limited. RLE 4/5.  Sensation diminished distally.  Skin: Skin is warm and dry.  TMA Wound well approximated when last seen. Now in coban dressing.   Psychiatric: She has a normal mood and affect. Her behavior is normal.   Assessment/Plan: 1. Mobility and functional deficits secondary to left TMA which require 3+ hours per day of interdisciplinary therapy in a comprehensive inpatient rehab setting. Physiatrist is providing close team supervision and 24 hour management of active medical problems listed below. Physiatrist and rehab team continue to assess barriers to discharge/monitor patient progress toward functional and medical goals.  Function:  Bathing Bathing position      Bathing parts      Bathing assist        Upper Body Dressing/Undressing Upper body dressing                    Upper body assist        Lower Body Dressing/Undressing Lower body dressing                                  Lower body assist        Toileting Toileting   Toileting steps completed by patient: Adjust clothing prior to toileting, Performs perineal hygiene, Adjust clothing after toileting   Toileting Assistive Devices: Grab bar or rail  Toileting assist Assist level: Supervision or verbal cues   Transfers Chair/bed transfer     Chair/bed transfer assist level: Touching or steadying assistance (Pt >  75%) Chair/bed transfer assistive device: Bedrails, Nurse, adult          Cognition Comprehension    Expression    Social Interaction Social Interaction assist level: Interacts appropriately 90% of the time - Needs monitoring or encouragement for participation or interaction.  Problem Solving    Memory     Medical Problem List and Plan: 1. Mobility and functional deficits secondary to PAD LLE, s/p TMA  -begin CIR therapies 2. DVT Prophylaxis/Anticoagulation: Pharmaceutical: Lovenox 3. Pain Management: Tylenol prn effective.   4. Mood: LCSW to follow for evaluation and support.  5. Neuropsych: This patient is capable of making decisions on her own behalf. 6. Skin/Wound Care: Routine pressure relief measures. Elevation when seated for edema control. Continue LLE compression wrap for approx one to two weeks unless otherwise clinically indicated.  7. Fluids/Electrolytes/Nutrition: lytes pending today  -watch sodium given large amount of lasix she's using.  8. DM type 1 with nephropathy, neuropathy and retinopathy: Adjusting levemir to bid/home regimen for more consistent coverage--patient refusing meal coverage. Monitor BS ac/hs and use SSI for elevated BS.   -increase to 12u bid today (on 24/12 at home) 9. CAD/Chronic systolic CHF/Moderate AS: Monitor for signs of over load with daily weights. Continue Imdur at bedtime with lasix and Lipitor daily. Low salt diet.   -checking BNP  -pt states her ideal weight is around 165lb 10. Pernicious anemia/iron deficiency anemia: 11. GERD: On Carafate.  12. CKD: Baseline Cr 1.6-1.7 range in the past few months. Lytes pending today. Avoid nephrotoxic meds   LOS (Days) 1 A FACE TO FACE EVALUATION WAS PERFORMED  Dave Mannes T 03/24/2016 8:36 AM

## 2016-03-24 NOTE — Evaluation (Signed)
Occupational Therapy Assessment and Plan  Patient Details  Name: [name] MRN: [id number] Date of Birth: 04/07/1946  OT Diagnosis: muscle weakness (generalized) Rehab Potential: Rehab Potential (ACUTE ONLY): Good ELOS: 5-7 days   Today's Date: 03/24/2016 OT Individual Time: 9518-8416 OT Individual Time Calculation (min): 75 min     Problem List:  Patient Active Problem List   Diagnosis Date Noted  . PAD (peripheral artery disease) ([place]) 03/23/2016  . Status post transmetatarsal amputation of left foot ([place]) 03/21/2016  . Pain in limb 03/02/2014  . Skin tear 11/17/2013  . PVD (peripheral vascular disease) ([place]) 09/22/2013  . Aftercare following surgery of the circulatory system, [place] 06/09/2013  . Atherosclerosis of native arteries of the extremities with ulceration(440.23) 06/09/2013  . Diabetic osteomyelitis ([place]) 06/02/2013  . 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 Abnormality of gait 01/21/2013  . CKD (chronic kidney disease), stage III 11/29/2012  . Chronic combined systolic and diastolic heart failure ([place]) 04/24/2012  . Health care maintenance 04/24/2012  . Iron deficiency anemia 04/15/2012  . Vitamin B12 deficiency (dietary) anemia 04/15/2012  . Aortic stenosis 08/22/2011  . ACQUIRED HEMOLYTIC ANEMIA UNSPECIFIED 06/06/2009  . CAD 02/03/2009  . CAROTID ARTERY STENOSIS, WITHOUT INFARCTION 02/03/2009  . Peripheral artery disease 02/03/2009  . Unspecified hypothyroidism 02/01/2009  . DYSLIPIDEMIA 02/01/2009  . Essential hypertension 02/01/2009  . DM (diabetes mellitus) type I uncontrolled with renal manifestation ([place]) 10/15/1949    Past Medical History:  Past Medical History  Diagnosis Date  . HTN (hypertension)   . Hypothyroidism   . History of recurrent UTIs   . CAD (coronary artery disease)     s/p CABG x3 in 1993; last cath in 2010  . Scleroderma ([place])   . Bilateral carpal tunnel syndrome 1970s  . PVD (peripheral  vascular disease) ([place])     Toes amputated from left foot and has had prior bypass on left leg. Followed by Dr. Donnetta Hutching  . Claudication ([place])   . Aortic stenosis     0.8cm2 on echo in 10/2011  . Hyperlipidemia     on Lipitor  . Cat bite 2009    with MRSA; required I & D  . Heart murmur   . Blood transfusion     no reaction from transfusion; "when I had heart surgery" (08/28/2012)  . GERD (gastroesophageal reflux disease)   . CHF (congestive heart failure) ([place]) Aug. 2012    Echo 10/2011: Mild LVH, EF 30%, apex akinetic, septal HK, grade 2 diastolic dysfunction, or functionally bicuspid aortic valve, mild aortic stenosis by gradient, severe by calculated AVA-suspect A. Aortic stenosis probably moderate, trivial MR, mild LAE, mild RAE.   [name] Cellulitis   . Anemia   . B12 deficiency anemia     B 12 injection "monthly since 05/2012" (08/28/2012)  . Iron deficiency anemia 06/13/2012    "iron infusion" (08/28/2012  . Anginal pain ([place])     "history; not currently" (08/28/2012)  . Myocardial infarction [facility]) 1986    "silent"  . History of bronchitis     "I've had it twice in my life" (08/28/2012)  . Exertional dyspnea     "because of the heart failure" (08/28/2012)  . Type 1 diabetes ([place]) 10/1949  . Stomach ulcer 1981?    "on Tagament for ~ 1 yr" in 79s  . Seizure ([place])     ? seizure like event in 2010; workup included stress testing which led to repeat cath.   [name]  Arthritis     "hands, hips, all over" (08/28/2012  . Osteoarthritis of both feet   . Breast cancer ([place])     left  . Cellulitis June 2013, Nov. 2013 and Feb. 14, 2014    Left leg  . Osteomyelitis ([place])     left foot   Past Surgical History:  Past Surgical History  Procedure Laterality Date  . Carpal tunnel release  ~ 1970's    bilaterally  . Mastectomy  11/1982    Left Breast  . Vitrectomy  1990's-2004    "both eyes; I've had total of 4"  . Tubal ligation  1984  . Tonsillectomy  1952    "?adenoids"   . Appendectomy   1991  . Femoral bypass      left leg "related to transmetatarsal amputation" (08/28/2012)  . Breast biopsy with sentinel lymph node biopsy and needle localization  1984  . Coronary artery bypass graft  1992    LIMA to LAD, SVG to distal LCX & SVG to Marginal  . Cardiac catheterization  2010    LIMA to LAD patent yet with occluded distal LAD after graft insertion & retrograde is occluded. 3-4 septal perforators arise &are patent. SVG to a large marginal patent; SVG presumably to the distal dominant LCX is occluded, moderately high grade stenosis of the AV circumflex, but with distal stenoses involving a severely diseased marginal branch not amenable  to PCI  nor is inferior branch   . Cardiac catheterization  10/2011    "unsuccessful attempt of stenting" (08/28/2012)  . Cardiac catheterization  1992; 09/2011  . Breast biopsy    . Trigger finger release  1980's    right thumb  . Incision and drainage of wound  ~ 1967    "infection in left foot" (08/28/2012)  . Incision and drainage of wound  2009    "3 ORs on my right hand after cat bite" (08/28/2012)  . 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 Cataract extraction w/ intraocular lens  implant, bilateral  1990's  . Amputation Right 05/27/2013    Procedure: AMPUTATION DIGIT FIFTH TOE;  Surgeon: Rosetta Posner, MD;  Location: [place];  Service: Vascular;  Laterality: Right;  . Left and right heart catheterization with coronary angiogram N/A 09/17/2011    Procedure: LEFT AND RIGHT HEART CATHETERIZATION WITH CORONARY ANGIOGRAM;  Surgeon: Hillary Bow, MD;  Location: [facility] CATH LAB;  Service: Cardiovascular;  Laterality: N/A;  . Percutaneous coronary stent intervention (pci-s) N/A 11/15/2011    Procedure: PERCUTANEOUS CORONARY STENT INTERVENTION (PCI-S);  Surgeon: Hillary Bow, MD;  Location: [facility] CATH LAB;  Service: Cardiovascular;  Laterality: N/A;  . Lower extremity angiogram N/A 05/25/2013    Procedure: LOWER EXTREMITY  ANGIOGRAM POSS INTERVENTION;  Surgeon: Conrad , MD;  Location: [facility] CATH LAB;  Service: Cardiovascular;  Laterality: N/A;  . Amputation Left 03/21/2016    Procedure: Left Transmetatarsal Amputation;  Surgeon: Newt Minion, MD;  Location: [place];  Service: Orthopedics;  Laterality: Left;    Assessment & Plan Clinical Impression: Patient is a 70 y.o. year old female with history of HTN, CAD s/p CABG, chronic systolic CHF, DM type 1, CKD, moderate aortic stenosis, multiple recent syncope episodes, PAD with multiple revascularization procedures, 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 She was admitted on 03/21/16 for left transmet amputation by Dr. Sharol Given. Post op to be NWB on LLE with Darco shoe  and noted to be having difficulty with transfers as well as inability to stand. WOC consulted to help with drainage due to venous stasis and 4 layer compression wrap placed on LLE with plans to follow up with Dr. Sharol Given in a week for dressing change.    Patient transferred to CIR on 03/23/2016.    Patient currently requires moderate assistance with basic self-care skills secondary to muscle weakness.  Prior to hospitalization, patient could complete BADL and most iADL independently; pt paid for assist with heavy home making.and home maintenance (yard).    Patient will benefit from skilled intervention to increase independence with basic self-care skills and increase level of independence with iADL prior to discharge home independently.  Anticipate patient will require intermittent supervision and no further OT follow recommended.  OT - End of Session Activity Tolerance: Tolerates 30+ min activity with multiple rests Endurance Deficit: Yes OT Assessment Rehab Potential (ACUTE ONLY): Good Barriers to Discharge: Decreased caregiver support OT Patient demonstrates impairments in the following area(s): Balance;Safety;Endurance OT Basic ADL's Functional Problem(s):  Bathing;Dressing;Toileting OT Advanced ADL's Functional Problem(s): Simple Meal Preparation OT Transfers Functional Problem(s): Toilet;Tub/Shower OT Plan OT Intensity: Minimum of 1-2 x/day, 45 to 90 minutes OT Frequency: 5 out of 7 days OT Duration/Estimated Length of Stay: 5-7 days OT Treatment/Interventions: Balance/vestibular training;Functional mobility training;Self Care/advanced ADL retraining;Therapeutic Activities;Therapeutic Exercise;Patient/family education;Discharge planning;DME/adaptive equipment instruction OT Self Feeding Anticipated Outcome(s): Independent OT Basic Self-Care Anticipated Outcome(s): Mod I OT Toileting Anticipated Outcome(s): Mod I OT Bathroom Transfers Anticipated Outcome(s): Mod I OT Recommendation Patient destination: Home Equipment Recommended: To be determined Equipment Details: Has walk-in shower w/pull down seat   Skilled Therapeutic Intervention OT initial evaluation completed with treatment provided to focus on adapted bathing/dressing skills, transfers, dynamic standing balance, adherence to NWB precautions, re-ed on safety awareness and methods/goals of treatment.   Pt rejected shower-level BADL offered and requested setup and assist to only bathe and dress at sink.   During session, pt demonstrated her use of rollator at home to include propelling rollator short distances while remaining seated on it.   OT re-educated pt on fall risk d/t instability and advised pt to adhere to PT recommendations relating to use of AD (likely need for RW and w/c).   Pt completed BADL with overall mod assist to dress lower body d/t presence of coban wrap on LLE restricting clothing.    OT Evaluation Precautions/Restrictions  Precautions Precautions: Fall Required Braces or Orthoses: Other Brace/Splint Other Brace/Splint: post op shoe Restrictions Weight Bearing Restrictions: Yes LLE Weight Bearing: Non weight bearing  General Chart Reviewed:  Yes Family/Caregiver Present: No  Vital Signs Therapy Vitals Temp: 98.3 F (36.8 C) Temp Source: Oral Pulse Rate: 73 Resp: 18 BP: (!) 131/58 mmHg Patient Position (if appropriate): Sitting Oxygen Therapy SpO2: 97 % O2 Device: Not Delivered  Pain Pain Assessment Pain Assessment: No/denies pain  Home Living/Prior Functioning Home Living Family/patient expects to be discharged to:: Private residence Living Arrangements: Alone Available Help at Discharge: Neighbor, Available PRN/intermittently Type of Home: House Home Access: Stairs to enter Technical brewer of Steps: 6 Entrance Stairs-Rails: Right, Left Home Layout: Two level, Able to live on main level with bedroom/bathroom Alternate Level Stairs-Rails: Right, Left Bathroom Shower/Tub: Multimedia programmer: Standard Bathroom Accessibility: Yes Additional Comments: Steps in garage have higher rise; front steps are easier to ascend  Lives With: Alone IADL History Homemaking Responsibilities: Yes Meal Prep Responsibility: Primary Laundry Responsibility: Primary Cleaning Responsibility: Secondary Bill Paying/Finance Responsibility: Primary Shopping Responsibility:  Primary Child Care Responsibility: No Homemaking Comments: pays for heavy housekeeping and yards Current License: Yes Mode of Transportation: Car Education: HS, + 2 years college (no degree) Occupation: Retired 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   Limited by CHF; used to dance.    Sedentary now; watches TV Prior Function Level of Independence: Independent with basic ADLs, Other (comment) (CHF limited; pays for heavy work assist.)  Able to Take Stairs?: Yes Driving: Yes Vocation: Retired  ADL ADL ADL Comments: see Functional Assessment Tool  Vision/Perception  Vision- History Baseline Vision/History: Retinopathy (History of chronic visual  impairment) Patient Visual Report: No change from baseline Vision- Assessment Vision Assessment?: Yes Additional Comments: Reports history of chronic visual impairment and receiving ongoing treatments with interventions recommended by provider.   Cognition Overall Cognitive Status: Within Functional Limits for tasks assessed Arousal/Alertness: Awake/alert Orientation Level: Person;Place;Situation Person: Oriented Place: Oriented Situation: Oriented Year: 2017 Month: June Day of Week: Correct Immediate Memory Recall: Sock;Blue;Bed Memory Recall: Sock;Blue;Bed Memory Recall Sock: Without Cue Memory Recall Blue: Without Cue Memory Recall Bed: Without Cue Attention: Alternating Awareness: Appears intact Problem Solving: Appears intact  Sensation Sensation Light Touch: Impaired by gross assessment (neuropathies, BLE) Stereognosis: Appears Intact Hot/Cold: Appears Intact Proprioception: Appears Intact Coordination Gross Motor Movements are Fluid and Coordinated: No Fine Motor Movements are Fluid and Coordinated: Yes  Motor  Motor Motor: Within Functional Limits  Mobility  Bed Mobility Bed Mobility: Rolling Right;Rolling Left;Supine to Sit;Sit to Supine Rolling Right: 5: Supervision Rolling Left: 5: Supervision Supine to Sit: 5: Supervision Sit to Supine: 5: Supervision Transfers Sit to Stand: 4: Min assist   Trunk/Postural Assessment  Cervical Assessment Cervical Assessment: Within Functional Limits Thoracic Assessment Thoracic Assessment: Within Functional Limits Lumbar Assessment Lumbar Assessment: Within Functional Limits Postural Control Postural Control: Within Functional Limits   Balance Balance Balance Assessed: Yes Static Sitting Balance Static Sitting - Level of Assistance: 6: Modified independent (Device/Increase time) Dynamic Sitting Balance Dynamic Sitting - Level of Assistance: 6: Modified independent (Device/Increase time) Static Standing  Balance Static Standing - Level of Assistance: 5: Stand by assistance Dynamic Standing Balance Dynamic Standing - Level of Assistance: 4: Min assist   Extremity/Trunk Assessment RUE Assessment RUE Assessment: Exceptions to [facility] RUE AROM (degrees) Overall AROM Right Upper Extremity: Due to premorbid status (history of shoulder OA w/o injury) LUE Assessment LUE Assessment: Exceptions to WFL LUE AROM (degrees) Overall AROM Left Upper Extremity: Due to premorbid status (history of shoulder OA w/o injury)   See Function Navigator for Current Functional Status.   Refer to Care Plan for Long Term Goals  Recommendations for other services: None  Discharge Criteria: Patient will be discharged from OT if patient refuses treatment 3 consecutive times without medical reason, if treatment goals not met, if there is a change in medical status, if patient makes no progress towards goals or if patient is discharged from hospital.  The above assessment, treatment plan, treatment alternatives and goals were discussed and mutually agreed upon: by patient   Second session: Time: 1430-1500 Time Calculation (min):  30 min  Pain Assessment: No/denies pain  Skilled Therapeutic Interventions: ADL-retraining with focus on tranfers (toilet), toileting, dynamic standing balance and problem-solving during performance of BADL (washing her hair).   Pt received after PT appt and requested immediate assist to toilet.   Pt now using RW as advised by PT and w/c for safety w/o objections although reiterating plan to resume use  of rollator at her home despite fall risk warning.   Pt escorted to toilet in w/c and completed SPT using grab bars with mod instructional cues on technique.   Pt resists attempt to transfer toward left side as advised for improved independence and required setup to place w/c properly  to accommodate preference to transfer only to her right side.   Pt then requested setup to wash her hair standing  supported at sink, as she does at her home, despite recommendation to use hand shower in walk-in shower.   Pt required extra time to accommodate specific setup duplicating her method at her home using shower chair to bear weight through her left knee while standing supported at sink and leaning under faucet.   Pt completed hair care with standby assist although depleted d/t fatigue from prolonged standing.    Overall pt requires setup and steadying assist during BADL but exhibits resistance to recommendations provided for safety and energy conservation.        See FIM for current functional status  Therapy/Group: Individual Therapy Allani Reber 03/25/2016, 5:56 AM

## 2016-03-25 DIAGNOSIS — N189 Chronic kidney disease, unspecified: Secondary | ICD-10-CM

## 2016-03-25 DIAGNOSIS — E1022 Type 1 diabetes mellitus with diabetic chronic kidney disease: Secondary | ICD-10-CM

## 2016-03-25 LAB — BASIC METABOLIC PANEL
Anion gap: 6 (ref 5–15)
BUN: 42 mg/dL — AB (ref 6–20)
CHLORIDE: 102 mmol/L (ref 101–111)
CO2: 26 mmol/L (ref 22–32)
CREATININE: 1.83 mg/dL — AB (ref 0.44–1.00)
Calcium: 9 mg/dL (ref 8.9–10.3)
GFR calc Af Amer: 31 mL/min — ABNORMAL LOW (ref >60)
GFR calc non Af Amer: 27 mL/min — ABNORMAL LOW (ref >60)
Glucose, Bld: 160 mg/dL — ABNORMAL HIGH (ref 65–99)
POTASSIUM: 4.5 mmol/L (ref 3.5–5.1)
SODIUM: 134 mmol/L — AB (ref 135–145)

## 2016-03-25 LAB — GLUCOSE, CAPILLARY
GLUCOSE-CAPILLARY: 176 mg/dL — AB (ref 65–99)
GLUCOSE-CAPILLARY: 83 mg/dL (ref 65–99)
GLUCOSE-CAPILLARY: 193 mg/dL — AB (ref 65–99)
Glucose-Capillary: 140 mg/dL — ABNORMAL HIGH (ref 65–99)
Glucose-Capillary: 225 mg/dL — ABNORMAL HIGH (ref 65–99)

## 2016-03-25 LAB — BRAIN NATRIURETIC PEPTIDE: B Natriuretic Peptide: 683.4 pg/mL — ABNORMAL HIGH (ref 0.0–100.0)

## 2016-03-25 MED ORDER — FUROSEMIDE 40 MG PO TABS
60.0000 mg | ORAL_TABLET | ORAL | Status: DC
  Administered 2016-03-25 – 2016-03-27 (×2): 60 mg via ORAL
  Filled 2016-03-25 (×2): qty 1

## 2016-03-25 MED ORDER — INSULIN DETEMIR 100 UNIT/ML ~~LOC~~ SOLN
10.0000 [IU] | Freq: Two times a day (BID) | SUBCUTANEOUS | Status: DC
  Administered 2016-03-25 – 2016-03-26 (×3): 10 [IU] via SUBCUTANEOUS
  Filled 2016-03-25 (×6): qty 0.1

## 2016-03-25 MED ORDER — INSULIN ASPART 100 UNIT/ML ~~LOC~~ SOLN
0.0000 [IU] | Freq: Three times a day (TID) | SUBCUTANEOUS | Status: DC
  Administered 2016-03-25: 2 [IU] via SUBCUTANEOUS

## 2016-03-25 MED ORDER — FUROSEMIDE 20 MG PO TABS
100.0000 mg | ORAL_TABLET | ORAL | Status: DC
  Administered 2016-03-26: 100 mg via ORAL
  Filled 2016-03-25: qty 2

## 2016-03-25 NOTE — Progress Notes (Signed)
CBG at HS=197, would only take 10units of prescribed levemir. "my blood sugar will drop." At 0140 complained of "sugar too low." CBG=176. Refused lovenox. [name]

## 2016-03-25 NOTE — Progress Notes (Signed)
[facility] PHYSICAL MEDICINE & REHABILITATION     PROGRESS NOTE    Subjective/Complaints: States she was sleepy all day from trazodone although she was wide awake in the morning yesterday when I saw her. Feels hypoglycemic if her sugar falls below 170 ("i know my body"). Felt therapy wasn't focusing on the correct things yesterday.   ROS: Pt denies fever, rash/itching, headache, blurred or double vision, nausea, vomiting, abdominal pain, diarrhea, chest pain, shortness of breath, palpitations, dysuria, dizziness, neck or back pain, bleeding, anxiety, or depression   Objective: Vital Signs: Blood pressure 128/54, pulse 73, temperature 98 F (36.7 C), temperature source Oral, resp. rate 18, weight 77.8 kg (171 lb 8.3 oz), SpO2 97 %. No results found.  Recent Labs  03/24/16 0428  WBC 5.8  HGB 10.3*  HCT 32.6*  PLT 169    Recent Labs  03/24/16 1446 03/25/16 0552  NA 134* 134*  K 4.5 4.5  CL 100* 102  GLUCOSE 82 160*  BUN 43* 42*  CREATININE 1.87* 1.83*  CALCIUM 9.3 9.0   CBG (last 3)   Recent Labs  03/24/16 2103 03/25/16 0136 03/25/16 0641  GLUCAP 197* 176* 140*    Wt Readings from Last 3 Encounters:  03/25/16 77.8 kg (171 lb 8.3 oz)  03/21/16 77.111 kg (170 lb)  03/19/16 77.474 kg (170 lb 12.8 oz)    Physical Exam:  Constitutional: She is oriented to person, place, and time. She appears well-developed and well-nourished.  HENT:  Head: Normocephalic and atraumatic.  Mouth/Throat: Oropharynx is clear and moist.  Eyes: Conjunctivae are normal. Pupils are equal, round, and reactive to light.  Neck: Normal range of motion. Neck supple.  Cardiovascular: Normal rate and regular rhythm.  Murmur heard. Respiratory: Effort normal and breath sounds normal. No stridor. No respiratory distress. She has no wheezes.  GI: Soft. Bowel sounds are normal. She exhibits no distension. There is no tenderness.  Musculoskeletal: She exhibits edema (2+ edema  LLE--compressions dressing).  Neurological: She is alert and oriented to person, place, and time. No cranial nerve deficit.  UE 5/5. LLE 3/5 prox to distal, left ankle motion limited. RLE 4/5. Sensation diminished distally.  Skin: Skin is warm and dry.  TMA Wound well approximated when last seen. Now in coban dressing.   Psychiatric: She has a normal mood and affect. Her behavior is normal.   Assessment/Plan: 1. Mobility and functional deficits secondary to left TMA which require 3+ hours per day of interdisciplinary therapy in a comprehensive inpatient rehab setting. Physiatrist is providing close team supervision and 24 hour management of active medical problems listed below. Physiatrist and rehab team continue to assess barriers to discharge/monitor patient progress toward functional and medical goals.  Function:  Bathing Bathing position   Position: Wheelchair/chair at sink  Bathing parts Body parts bathed by patient: Right arm, Left arm, Chest, Abdomen, Front perineal area, Right upper leg, Left upper leg, Buttocks Body parts bathed by helper: Back  Bathing assist Assist Level: Touching or steadying assistance(Pt > 75%)      Upper Body Dressing/Undressing Upper body dressing   What is the patient wearing?: Pull over shirt/dress, Bra Bra - Perfomed by patient: Thread/unthread right bra strap, Thread/unthread left bra strap, Hook/unhook bra (pull down sports bra)   Pull over shirt/dress - Perfomed by patient: Thread/unthread right sleeve, Thread/unthread left sleeve, Put head through opening, Pull shirt over trunk          Upper body assist Assist Level: Set up  Set up : To obtain clothing/put away  Lower Body Dressing/Undressing Lower body dressing   What is the patient wearing?: Underwear, Pants, Socks, Shoes Underwear - Performed by patient: Pull underwear up/down Underwear - Performed by helper: Thread/unthread right underwear leg, Thread/unthread left underwear  leg Pants- Performed by patient: Pull pants up/down Pants- Performed by helper: Thread/unthread right pants leg, Thread/unthread left pants leg       Socks - Performed by helper: Don/doff right sock   Shoes - Performed by helper: Don/doff right shoe, Don/doff left shoe, Fasten left          Lower body assist Assist for lower body dressing: Touching or steadying assistance (Pt > 75%)      Toileting Toileting   Toileting steps completed by patient: Adjust clothing prior to toileting, Performs perineal hygiene, Adjust clothing after toileting   Toileting Assistive Devices: Grab bar or rail  Toileting assist Assist level: Supervision or verbal cues   Transfers Chair/bed transfer   Chair/bed transfer method: Stand pivot Chair/bed transfer assist level: Touching or steadying assistance (Pt > 75%) Chair/bed transfer assistive device: Armrests, Medical sales representative     Max distance: 7 ft Assist level: Touching or steadying assistance (Pt > 75%)   Wheelchair   Type: Manual Max wheelchair distance: 38ft Assist Level: Supervision or verbal cues  Cognition Comprehension Comprehension assist level: Understands complex 90% of the time/cues 10% of the time  Expression Expression assist level: Expresses complex 90% of the time/cues < 10% of the time  Social Interaction Social Interaction assist level: Interacts appropriately 90% of the time - Needs monitoring or encouragement for participation or interaction.  Problem Solving Problem solving assist level: Solves basic 90% of the time/requires cueing < 10% of the time  Memory Memory assist level: Recognizes or recalls 90% of the time/requires cueing < 10% of the time   Medical Problem List and Plan: 1. Mobility and functional deficits secondary to PAD LLE, s/p TMA  -continue CIR therapies  -encouraged pt to revisit goals tomorrow with PT 2. DVT Prophylaxis/Anticoagulation: Pharmaceutical: Lovenox 3. Pain Management:  Tylenol prn effective.  4. Mood: LCSW to follow for evaluation and support.  5. Neuropsych: This patient is capable of making decisions on her own behalf. 6. Skin/Wound Care: Routine pressure relief measures. Elevation when seated for edema control. Continue LLE compression wrap for approx one to two weeks unless otherwise clinically indicated.  7. Fluids/Electrolytes/Nutrition: lytes pending    -sodium improved today.  8. DM type 1 with nephropathy, neuropathy and retinopathy: Adjusting levemir to bid/home regimen for more consistent coverage--patient refusing meal coverage. Monitor BS ac/hs and use SSI for elevated BS.   -reduced back to 10u bid today (on 24/12 at home)  -changed SSI to meet her needs as she feels "hypoglycemic at 170 or below"---expressed to her this is much the reason she's in the situation she is---she understands 9. CAD/Chronic systolic CHF/Moderate AS: Monitor for signs of over load with daily weights. Continue Imdur at bedtime with lasix and Lipitor daily. Low salt diet.   -BNP was 562 yesterday, 683 today after increased lasix, weight decreased  -no signs of fluid overload today  -will go back to her prior schedule of 60mg  alternating with 100mg  qod lasix as I don't want to worsen her Cr or Na+  -Cr stable at 1.83 today (1.87 yesterday)  -Sodium improved. (134)  -pt states her ideal weight is around 165lb   Filed Weights   03/24/16 0530  03/25/16 0624  Weight: 78.382 kg (172 lb 12.8 oz) 77.8 kg (171 lb 8.3 oz)    10. Pernicious anemia/iron deficiency anemia: 11. GERD: On Carafate.  12. CKD: Baseline Cr 1.6-1.7 range in the past few months.  Avoid nephrotoxic meds. See above   LOS (Days) 2 A FACE TO FACE EVALUATION WAS PERFORMED  Reniya Mcclees T 03/25/2016 8:13 AM

## 2016-03-25 NOTE — Progress Notes (Signed)
Patient declined prn dulcolax suppository. Last bowel movement 03/22/16. Writer explained the dangers of impaction or small bowel obstruction.

## 2016-03-25 NOTE — Progress Notes (Signed)
Pharmacist Heart Failure Core Measure Documentation  Assessment: [name] has an EF documented as 25-30% on 07/2015 by ECHO.  Rationale: Heart failure patients with left ventricular systolic dysfunction (LVSD) and an EF < 40% should be prescribed an angiotensin converting enzyme inhibitor (ACEI) or angiotensin receptor blocker (ARB) at discharge unless a contraindication is documented in the medical record.  This patient is not currently on an ACEI or ARB for HF.  This note is being placed in the record in order to provide documentation that a contraindication to the use of these agents is present for this encounter.  ACE Inhibitor or Angiotensin Receptor Blocker is contraindicated (specify all that apply)  []   ACEI allergy AND ARB allergy []   Angioedema []   Moderate or severe aortic stenosis [x]   Hyperkalemia [x]   Hypotension []   Renal artery stenosis [x]   Worsening renal function, preexisting renal disease or dysfunction   [name] 03/25/2016 12:06 PM

## 2016-03-25 NOTE — Progress Notes (Signed)
Physical Therapy Session Note  Patient Details  Name: [name] MRN: [id number] Date of Birth: Feb 01, 1946  Today's Date: 03/24/2016 PT Individual Time: [id number] Individual calculated treatment time: 29 min       Short Term Goals: Week 1:  PT Short Term Goal 1 (Week 1): LTG =STG due to ELOS.   Skilled Therapeutic Interventions/Progress Updates:    Patient received sitting in recliner and agreeable to PT. Patient performed squat pivot transfer to [facility] with min A and max cues to maintain NWB on LLE, but patient was unable to complete without TTWB. PT educated patient that stand pivot transfer with RW would be safer, because it allows patient to maintain NWB .  Patient transported to rehab gym in [facility]. Stand pivot performed to mat table with Min A from PT. Patient instructed in press ups with handles 2 x 10 with mod cues for improved hips/head relationship 2x10. Stand pivot transfer back to [facility] with min A from PT.   Patient returned to room and performed stand pivot transfer to recliner with min A from PT. Patient left sitting in recliner with call bell within reach.   Therapy Documentation Precautions:  Precautions Precautions: Fall Required Braces or Orthoses: Other Brace/Splint Other Brace/Splint: post op shoe Restrictions Weight Bearing Restrictions: Yes LLE Weight Bearing: Non weight bearing General:   Vital Signs: Therapy Vitals Temp: 98 F (36.7 C) Temp Source: Oral Pulse Rate: 73 Resp: 18 BP: (!) 128/54 mmHg Patient Position (if appropriate): Lying Oxygen Therapy SpO2: 97 % O2 Device: Not Delivered Pain:   See Function Navigator for Current Functional Status.   Therapy/Group: Individual Therapy  Lorie Phenix 03/25/2016, 7:57 AM

## 2016-03-26 ENCOUNTER — Inpatient Hospital Stay (HOSPITAL_COMMUNITY): Payer: Medicare Other | Admitting: Physical Therapy

## 2016-03-26 ENCOUNTER — Inpatient Hospital Stay (HOSPITAL_COMMUNITY): Payer: Medicare Other | Admitting: Occupational Therapy

## 2016-03-26 DIAGNOSIS — N183 Chronic kidney disease, stage 3 (moderate): Secondary | ICD-10-CM

## 2016-03-26 LAB — GLUCOSE, CAPILLARY
GLUCOSE-CAPILLARY: 155 mg/dL — AB (ref 65–99)
GLUCOSE-CAPILLARY: 189 mg/dL — AB (ref 65–99)
GLUCOSE-CAPILLARY: 102 mg/dL — AB (ref 65–99)
Glucose-Capillary: 124 mg/dL — ABNORMAL HIGH (ref 65–99)
Glucose-Capillary: 123 mg/dL — ABNORMAL HIGH (ref 65–99)

## 2016-03-26 MED ORDER — CYANOCOBALAMIN 1000 MCG/ML IJ SOLN
1000.0000 ug | Freq: Once | INTRAMUSCULAR | Status: AC
  Administered 2016-03-26: 1000 ug via INTRAMUSCULAR
  Filled 2016-03-26: qty 1

## 2016-03-26 NOTE — Progress Notes (Signed)
Physical Therapy Session Note  Patient Details  Name: [name] MRN: [id number] Date of Birth: [birth year]  Today's Date: 03/26/2016 PT Individual Time: [id number] and [id number] PT Individual Time Calculation (min): 58 min and 56 min  Short Term Goals: Week 1:  PT Short Term Goal 1 (Week 1): LTG =STG due to ELOS.   Skilled Therapeutic Interventions/Progress Updates:    Treatment 1: Pt received sitting on EOB & agreeable to PT, denying c/o pain. Session focused on pt education. Pt voiced that she did not feel stair training was appropriate to be focusing on during PT treatment. Pt reports she plans to sit on rollator and use device to maneuver around house, have friends pick up groceries ordered online, friends assist with washing dishes & other tasks around house, and have neighbor's physical support in addition to 1 rail to enter house. Pt states "intellectually I hear what you're saying, but I am going to do what I want". Educated pt on purpose of PT in order to increase her safety prior to d/c. Pt reports she is willing to try negotiating stairs in manner she wants or either she plans to take EMS transport home. Pt reports bathrooms are too narrow for 3-in-1 BSC & w/c. Educated pt that it would be acceptable to use rollator (sitting) to access bathroom but she should use w/c for mobility throughout remainder of house. Pt reports she is TDWB LLE, this PT educated pt on NWB LLE. Pt agreeable to transfer training; educated pt on squat pivot bed>w/c & pt able to demonstrate with supervision while maintaining NWB LLE. Pt fatigues & becomes SOB quickly with w/c parts management & requires maximum assistance to manage w/c parts. Attempted to find pt 18x18 w/c for more appropriate fit but w/c not available. At end of session pt left in w/c with all needs within reach.   Treatment 2: Pt received in bed & agreeable to PT, denying c/o pain. Pt able to transfer L sidelying>sitting EOB with Mod I. Pt  performed squat pivot bed>w/c, self-selecting not to remove armrest, with supervision. Pt unable to demonstrate NWB LLE even with maximum cuing from PT. Therapist educated pt on NWB LLE but pt reports she is able to weight bear through L heel & feels as though she's putting more weight through foot when sitting in w/c with LE on leg rest.  PT continued to provide education to pt & pt became frustrated stating "drop it". Pt able to manage w/c parts with maximum cuing from PT, then self propelled w/c with BUE for only a maximum of 20 ft at a time before requiring a rest break due to SOB (SpO2 = 99% on room air). Set pt up at stairs in order to practice stair negotiation in manner she plans to do so at home. Pt plans to hold on to R ascending rail & to neighbor with LUE. When standing at bottom of stairs pt reported she could not complete task, "I don't have enough body strength to do that". Pt wishes to have EMS transport home. Notified LCSW of pt's need for 18x18 w/c. Pt performed additional w/c mobility for endurance training and at end of session pt left in w/c in room with all needs within reach.   Therapy Documentation Precautions:  Precautions Precautions: Fall Required Braces or Orthoses: Other Brace/Splint Other Brace/Splint: post op shoe Restrictions Weight Bearing Restrictions: Yes LLE Weight Bearing: Non weight bearing  Pain: Pain Assessment Pain Assessment: No/denies pain Pain Score: 0-No pain  See Function Navigator for Current Functional Status.   Therapy/Group: Individual Therapy  Waunita Schooner 03/26/2016, 7:53 AM

## 2016-03-26 NOTE — Progress Notes (Signed)
Patient information reviewed and entered into eRehab system by Jin Shockley, RN, CRRN, PPS Coordinator.  Information including medical coding and functional independence measure will be reviewed and updated through discharge.     Per nursing patient was given "Data Collection Information Summary for Patients in Inpatient Rehabilitation Facilities with attached "Privacy Act Statement-Health Care Records" upon admission.  

## 2016-03-26 NOTE — Care Management Note (Signed)
Inpatient [facility] Individual Statement of Services  Patient Name:  [name]  Date:  03/26/2016  Welcome to the [facility].  Our goal is to provide you with an individualized program based on your diagnosis and situation, designed to meet your specific needs.  With this comprehensive rehabilitation program, you will be expected to participate in at least 3 hours of rehabilitation therapies Monday-Friday, with modified therapy programming on the weekends.  Your rehabilitation program will include the following services:  Physical Therapy (PT), Occupational Therapy (OT), 24 hour per day rehabilitation nursing, Case Management (Social Worker), Rehabilitation Medicine, Nutrition Services and Pharmacy Services  Weekly team conferences will be held on Wednesday to discuss your progress.  Your Social Worker will talk with you frequently to get your input and to update you on team discussions.  Team conferences with you and your family in attendance may also be held.  Expected length of stay: 7 days  Overall anticipated outcome: mod/i-supervision level  Depending on your progress and recovery, your program may change. Your Social Worker will coordinate services and will keep you informed of any changes. Your Social Worker's name and contact numbers are listed  below.  The following services may also be recommended but are not provided by the [facility] will be made to provide these services after discharge if needed.  Arrangements include referral to agencies that provide these services.  Your insurance has been verified to be:  Medicare Your primary doctor is:    Pertinent information will be shared with your doctor and your insurance company.  Social Worker:  Ovidio Kin, [place] or ([phone]  Information discussed with and copy given to patient by: Elease Hashimoto, 03/26/2016, 9:17 AM

## 2016-03-26 NOTE — Progress Notes (Signed)
[place] PHYSICAL MEDICINE & REHABILITATION     PROGRESS NOTE    Subjective/Complaints: Pt sitting up in bed this AM, giving her order for lunch.  She states she wants to go home tomorrow.  She wants her insulin to be adjusted prior to discharge.   ROS: Denies CP, SOB, N/V/D.   Objective: Vital Signs: Blood pressure 108/46, pulse 79, temperature 98.1 F (36.7 C), temperature source Oral, resp. rate 17, weight 80.786 kg (178 lb 1.6 oz), SpO2 96 %. No results found.  Recent Labs  03/24/16 0428  WBC 5.8  HGB 10.3*  HCT 32.6*  PLT 169    Recent Labs  03/24/16 1446 03/25/16 0552  NA 134* 134*  K 4.5 4.5  CL 100* 102  GLUCOSE 82 160*  BUN 43* 42*  CREATININE 1.87* 1.83*  CALCIUM 9.3 9.0   CBG (last 3)   Recent Labs  03/25/16 1627 03/25/16 2122 03/26/16 0652  GLUCAP 83 193* 155*    Wt Readings from Last 3 Encounters:  03/26/16 80.786 kg (178 lb 1.6 oz)  03/21/16 77.111 kg (170 lb)  03/19/16 77.474 kg (170 lb 12.8 oz)    Physical Exam:  Constitutional: She appears well-developed and well-nourished. NAD. HENT: Normocephalic and atraumatic.  Eyes: Conjunctivae and EOM are normal.   Cardiovascular: Normal rate and regular rhythm. Murmur heard. Respiratory: Effort normal and breath sounds normal. No stridor. No respiratory distress. She has no wheezes.  GI: Soft. Bowel sounds are normal. She exhibits no distension. There is no tenderness.  Musculoskeletal: She exhibits edema (2+ edema LLE--compressions dressing).  Neurological: She is alert and oriented.   B/L UE: 5/5 proximal to distal.  LLE: 4/5 proximal to distal, left ankle motion limited due to dressing. RLE 4/5 proximal to distal  Skin: Skin is warm and dry.  TMA Wound well approximated previously, based on documentation. Now in coban dressing.   Psychiatric: She has a normal mood and affect. Her behavior is normal.   Assessment/Plan: 1. Mobility and functional deficits secondary to left TMA  which require 3+ hours per day of interdisciplinary therapy in a comprehensive inpatient rehab setting. Physiatrist is providing close team supervision and 24 hour management of active medical problems listed below. Physiatrist and rehab team continue to assess barriers to discharge/monitor patient progress toward functional and medical goals.  Function:  Bathing Bathing position   Position: Wheelchair/chair at sink  Bathing parts Body parts bathed by patient: Right arm, Left arm, Chest, Abdomen, Front perineal area, Right upper leg, Left upper leg, Buttocks Body parts bathed by helper: Back  Bathing assist Assist Level: Touching or steadying assistance(Pt > 75%)      Upper Body Dressing/Undressing Upper body dressing   What is the patient wearing?: Pull over shirt/dress, Bra Bra - Perfomed by patient: Thread/unthread right bra strap, Thread/unthread left bra strap, Hook/unhook bra (pull down sports bra)   Pull over shirt/dress - Perfomed by patient: Thread/unthread right sleeve, Thread/unthread left sleeve, Put head through opening, Pull shirt over trunk          Upper body assist Assist Level: Set up   Set up : To obtain clothing/put away  Lower Body Dressing/Undressing Lower body dressing   What is the patient wearing?: Underwear, Pants, Socks, Shoes Underwear - Performed by patient: Pull underwear up/down Underwear - Performed by helper: Thread/unthread right underwear leg, Thread/unthread left underwear leg Pants- Performed by patient: Pull pants up/down Pants- Performed by helper: Thread/unthread right pants leg, Thread/unthread left pants leg  Socks - Performed by helper: Don/doff right sock   Shoes - Performed by helper: Don/doff right shoe, Don/doff left shoe, Fasten left          Lower body assist Assist for lower body dressing: Touching or steadying assistance (Pt > 75%)      Toileting Toileting   Toileting steps completed by patient: Adjust clothing  prior to toileting, Performs perineal hygiene, Adjust clothing after toileting   Toileting Assistive Devices: Grab bar or rail  Toileting assist Assist level: Supervision or verbal cues   Transfers Chair/bed transfer   Chair/bed transfer method: Stand pivot Chair/bed transfer assist level: Touching or steadying assistance (Pt > 75%) Chair/bed transfer assistive device: Armrests     Locomotion Ambulation     Max distance: 7 ft Assist level: Touching or steadying assistance (Pt > 75%)   Wheelchair   Type: Manual Max wheelchair distance: 26ft Assist Level: Supervision or verbal cues  Cognition Comprehension Comprehension assist level: Understands complex 90% of the time/cues 10% of the time  Expression Expression assist level: Expresses complex 90% of the time/cues < 10% of the time  Social Interaction Social Interaction assist level: Interacts appropriately 90% of the time - Needs monitoring or encouragement for participation or interaction.  Problem Solving Problem solving assist level: Solves basic 90% of the time/requires cueing < 10% of the time  Memory Memory assist level: Recognizes or recalls 90% of the time/requires cueing < 10% of the time   Medical Problem List and Plan: 1. Mobility and functional deficits secondary to PAD LLE, s/p TMA  -continue CIR therapies 2. DVT Prophylaxis/Anticoagulation: Pharmaceutical: Lovenox 3. Pain Management: Tylenol prn effective.  4. Mood: LCSW to follow for evaluation and support.  5. Neuropsych: This patient is capable of making decisions on her own behalf. 6. Skin/Wound Care: Routine pressure relief measures. Elevation when seated for edema control. Continue LLE compression wrap for approx one to two weeks unless otherwise clinically indicated.  7. Fluids/Electrolytes/Nutrition:   -sodium stable 134 on 6/11.  8. DM type 1 with nephropathy, neuropathy and retinopathy: Adjusting levemir to bid/home regimen for more consistent  coverage. Monitor BS ac/hs and use SSI for elevated BS.   -Pt states she took Levamir 22U AM and 10U PM  -reduced back to 10u bid   -changed SSI to meet her needs as she feels "hypoglycemic at 120 or below" 9. CAD/Chronic systolic CHF/Moderate AS: Monitor for signs of over load with daily weights. Continue Imdur at bedtime with lasix and Lipitor daily. Low salt diet.   -BNP elevated 683 on 6/11  -no signs of fluid overload today  -will go back to her prior schedule of 60mg  alternating with 100mg  qod lasix   -Cr stable at 1.83 6/11  -Sodium improved on 6/11, 134  -pt states her ideal weight is around 165lb  Filed Weights   03/24/16 0530 03/25/16 0624 03/26/16 0619  Weight: 78.382 kg (172 lb 12.8 oz) 77.8 kg (171 lb 8.3 oz) 80.786 kg (178 lb 1.6 oz)  10. Pernicious anemia/iron deficiency anemia:  Hb 10.3 on 6/10 11. GERD: On Carafate.  12. CKD: Baseline Cr 1.6-1.7 range in the past few months.  Avoid nephrotoxic meds. See above   LOS (Days) 3 A FACE TO FACE EVALUATION WAS PERFORMED  Ankit Lorie Phenix 03/26/2016 8:37 AM

## 2016-03-26 NOTE — IPOC Note (Addendum)
Overall Plan of Care [facility]) Patient Details Name: [name] MRN: [id number] DOB: 1946-07-23  Admitting Diagnosis: TMA Admit  Hospital Problems: Principal Problem:   Status post transmetatarsal amputation of left foot ([place]) Active Problems:   Essential hypertension   DM (diabetes mellitus) type I uncontrolled with renal manifestation (HCC)   Iron deficiency anemia   Chronic combined systolic and diastolic heart failure (HCC)   PVD (peripheral vascular disease) (HCC)   PAD (peripheral artery disease) ([place])     Functional Problem List: Nursing Bowel, Endurance, Motor, Nutrition, Pain, Safety, Skin Integrity  PT Balance, Endurance, Edema, Pain, Safety  OT Balance, Safety, Endurance  SLP    TR         Basic ADL's: OT Bathing, Dressing, Toileting     Advanced  ADL's: OT Simple Meal Preparation     Transfers: PT Bed Mobility, Bed to Chair, Furniture, Floor, Teacher, early years/pre, Metallurgist: PT Ambulation, Stairs, Emergency planning/management officer     Additional Impairments: OT    SLP        TR      Anticipated Outcomes Item Anticipated Outcome  Self Feeding Independent  Swallowing      Basic self-care  Mod I  Toileting  Mod I   Bathroom Transfers Mod I  Bowel/Bladder  Continent to bowel and bladder with min. assisst.  Transfers  mod I with LRAD  Locomotion  Supervision with RW for house hold distancecs. WC for community.   Communication     Cognition     Pain  Less than 3,on 1 to 10 scale  Safety/Judgment  Free from fall during her stay in rehab.   Therapy Plan: PT Intensity: Minimum of 1-2 x/day ,45 to 90 minutes PT Frequency: 5 out of 7 days PT Duration Estimated Length of Stay: 7-10 days  OT Intensity: Minimum of 1-2 x/day, 45 to 90 minutes OT Frequency: 5 out of 7 days OT Duration/Estimated Length of Stay: 5-7 days         Team Interventions: Nursing Interventions Patient/Family Education, Bowel Management, Skin Care/Wound  Management, Pain Management  PT interventions Ambulation/gait training, Balance/vestibular training, Cognitive remediation/compensation, Community reintegration, Discharge planning, Disease management/prevention, DME/adaptive equipment instruction, Functional mobility training, Neuromuscular re-education, Pain management, Patient/family education, Psychosocial support, Skin care/wound management, Stair training, Therapeutic Activities, Therapeutic Exercise, UE/LE Strength taining/ROM, Splinting/orthotics, UE/LE Coordination activities, Wheelchair propulsion/positioning, Visual/perceptual remediation/compensation  OT Interventions Training and development officer, Functional mobility training, Self Care/advanced ADL retraining, Therapeutic Activities, Therapeutic Exercise, Patient/family education, Discharge planning, DME/adaptive equipment instruction  SLP Interventions    TR Interventions    SW/CM Interventions  Psychosocial Assessment, Pt/Family Education & Discharge Planning    Team Discharge Planning: Destination: PT-Home ,OT- Home , SLP-  Projected Follow-up: PT-Home health PT, OT-   , SLP-  Projected Equipment Needs: PT-Rolling walker with 5" wheels, OT- To be determined, SLP-  Equipment Details: PT- , OT-Has walk-in shower w/pull down seat Patient/family involved in discharge planning: PT- Patient,  OT-Patient, SLP-   MD ELOS: 2-4 days Medical Rehab Prognosis:  Good Assessment: 70 y.o.female with history of HTN, CAD s/p CABG, chronic systolic CHF, DM type 1, CKD,moderate aortic stenosis,multiple recent syncope episodes, PAD with multiple revascularization procedures, 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 She was admitted on 03/21/16 for left transmet amputation by Dr. Sharol Given. Post op to be NWB on LLE with Darco shoe and noted to be having difficulty with transfers as well as inability to stand.  WOC consulted to help with drainage due to venous stasis  and 4 layer compression wrap placed on LLE with plans to follow up with Dr. Sharol Given in a week for dressing change. Pt with resulting functional deficits with gait and balance. Will set goals for Mod I for PT/OT.    See Team Conference Notes for weekly updates to the plan of care

## 2016-03-26 NOTE — Discharge Summary (Signed)
discharge summary job # [id number]

## 2016-03-26 NOTE — Patient Care Conference (Signed)
Inpatient RehabilitationTeam Conference and Plan of Care Update Date: 03/28/2016   Time: 8:27 AM    Patient Name: [name]      Medical Record Number: [id number]  Date of Birth: 04/23/1946 Sex: Female         Room/Bed: 4M06C/4M06C-01 Payor Info: Payor: MEDICARE / Plan: MEDICARE PART A AND B / Product Type: *No Product type* /    Admitting Diagnosis: TMA Admit  Admit Date/Time:  03/23/2016  3:56 PM Admission Comments: No comment available   Primary Diagnosis:  Status post transmetatarsal amputation of left foot ([place]) Principal Problem: Status post transmetatarsal amputation of left foot [facility])  Patient Active Problem List   Diagnosis Date Noted  . PAD (peripheral artery disease) ([place]) 03/23/2016  . Status post transmetatarsal amputation of left foot ([place]) 03/21/2016  . Pain in limb 03/02/2014  . Skin tear 11/17/2013  . PVD (peripheral vascular disease) ([place]) 09/22/2013  . Aftercare following surgery of the circulatory system, [place] 06/09/2013  . Atherosclerosis of native arteries of the extremities with ulceration(440.23) 06/09/2013  . Diabetic osteomyelitis ([place]) 06/02/2013  . 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 Abnormality of gait 01/21/2013  . CKD (chronic kidney disease), stage III 11/29/2012  . Chronic combined systolic and diastolic heart failure ([place]) 04/24/2012  . Health care maintenance 04/24/2012  . Iron deficiency anemia 04/15/2012  . Vitamin B12 deficiency (dietary) anemia 04/15/2012  . Aortic stenosis 08/22/2011  . ACQUIRED HEMOLYTIC ANEMIA UNSPECIFIED 06/06/2009  . CAD 02/03/2009  . CAROTID ARTERY STENOSIS, WITHOUT INFARCTION 02/03/2009  . Peripheral artery disease 02/03/2009  . Unspecified hypothyroidism 02/01/2009  . DYSLIPIDEMIA 02/01/2009  . Essential hypertension 02/01/2009  . DM (diabetes mellitus) type I uncontrolled with renal manifestation ([place]) 10/15/1949    Expected Discharge Date: Expected Discharge Date:  03/27/16  Team Members Present: Physician leading conference: Dr. Delice Lesch Social Worker Present: Ovidio Kin, LCSW Nurse Present: Dorien Chihuahua, RN PT Present: Lavone Nian, PT OT Present: Benay Pillow, OT PPS Coordinator present : Daiva Nakayama, RN, CRRN     Current Status/Progress Goal Weekly Team Focus  Medical   Mobility and functional deficits secondary to PAD LLE, s/p TMA  Improved gait, mobility, safety  See above   Bowel/Bladder        Cont B & B     Swallow/Nutrition/ Hydration             ADL's   supervision - min A overall with self care tasks.  Mod I overall  pt education, d/c planning, safety, self care retraining   Mobility   supervision for squat pivot transfers, mod I bed mobility, 50 ft with w/c mobility & supervision, very poor safety awareness  mod I for w/c mobility, supervision for ambulation  pt education, d/c planning, w/c management & mobility   Communication             Safety/Cognition/ Behavioral Observations            Pain     pain managed   pain less than 3, premedicated prior to therapy     Skin     monitor foot-MD aware of drainage from bandage. Told see in office   foot wrapped in surgical wrap-per Dr Sharol Given        *See Care Plan and progress notes for long and short-term goals.  Barriers to Discharge: Safety, mobility, wound drainage    Possible Resolutions to Barriers:  Ortho to evaluate, PT, OT, continue dressings  Discharge Planning/Teaching Needs:    Home with intermittent assist from friends checking in on her. Needs to be mod/i level     Team Discussion:  Pt had decided before she came to rehab her length of stay of four days. She has her own way of doing things but will listen and try the therapists way. Has reached mod/i level and medically stable for discharge  Revisions to Treatment Plan:  DC 6/13   Continued Need for Acute Rehabilitation Level of Care: The patient requires daily medical management by a physician  with specialized training in physical medicine and rehabilitation for the following conditions: Daily direction of a multidisciplinary physical rehabilitation program to ensure safe treatment while eliciting the highest outcome that is of practical value to the patient.: Yes Daily medical management of patient stability for increased activity during participation in an intensive rehabilitation regime.: Yes Daily analysis of laboratory values and/or radiology reports with any subsequent need for medication adjustment of medical intervention for : Post surgical problems;Wound care problems  Elease Hashimoto 03/28/2016, 8:27 AM

## 2016-03-26 NOTE — Progress Notes (Signed)
Social Work  Social Work Assessment and Plan  Patient Details  Name: [name] MRN: [id number] Date of Birth: Apr 21, 1946  Today's Date: 03/26/2016  Problem List:  Patient Active Problem List   Diagnosis Date Noted  . PAD (peripheral artery disease) ([place]) 03/23/2016  . Status post transmetatarsal amputation of left foot ([place]) 03/21/2016  . Pain in limb 03/02/2014  . Skin tear 11/17/2013  . PVD (peripheral vascular disease) ([place]) 09/22/2013  . Aftercare following surgery of the circulatory system, [place] 06/09/2013  . Atherosclerosis of native arteries of the extremities with ulceration(440.23) 06/09/2013  . Diabetic osteomyelitis ([place]) 06/02/2013  . 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 Abnormality of gait 01/21/2013  . CKD (chronic kidney disease), stage III 11/29/2012  . Chronic combined systolic and diastolic heart failure ([place]) 04/24/2012  . Health care maintenance 04/24/2012  . Iron deficiency anemia 04/15/2012  . Vitamin B12 deficiency (dietary) anemia 04/15/2012  . Aortic stenosis 08/22/2011  . ACQUIRED HEMOLYTIC ANEMIA UNSPECIFIED 06/06/2009  . CAD 02/03/2009  . CAROTID ARTERY STENOSIS, WITHOUT INFARCTION 02/03/2009  . Peripheral artery disease 02/03/2009  . Unspecified hypothyroidism 02/01/2009  . DYSLIPIDEMIA 02/01/2009  . Essential hypertension 02/01/2009  . DM (diabetes mellitus) type I uncontrolled with renal manifestation ([place]) 10/15/1949   Past Medical History:  Past Medical History  Diagnosis Date  . HTN (hypertension)   . Hypothyroidism   . History of recurrent UTIs   . CAD (coronary artery disease)     s/p CABG x3 in 1993; last cath in 2010  . Scleroderma ([place])   . Bilateral carpal tunnel syndrome 1970s  . PVD (peripheral vascular disease) ([place])     Toes amputated from left foot and has had prior bypass on left leg. Followed by Dr. Donnetta Hutching  . Claudication ([place])   . Aortic stenosis     0.8cm2 on echo in 10/2011  .  Hyperlipidemia     on Lipitor  . Cat bite 2009    with MRSA; required I & D  . Heart murmur   . Blood transfusion     no reaction from transfusion; "when I had heart surgery" (08/28/2012)  . GERD (gastroesophageal reflux disease)   . CHF (congestive heart failure) ([place]) Aug. 2012    Echo 10/2011: Mild LVH, EF 30%, apex akinetic, septal HK, grade 2 diastolic dysfunction, or functionally bicuspid aortic valve, mild aortic stenosis by gradient, severe by calculated AVA-suspect A. Aortic stenosis probably moderate, trivial MR, mild LAE, mild RAE.   [name] Cellulitis   . Anemia   . B12 deficiency anemia     B 12 injection "monthly since 05/2012" (08/28/2012)  . Iron deficiency anemia 06/13/2012    "iron infusion" (08/28/2012  . Anginal pain ([place])     "history; not currently" (08/28/2012)  . Myocardial infarction [facility]) 1986    "silent"  . History of bronchitis     "I've had it twice in my life" (08/28/2012)  . Exertional dyspnea     "because of the heart failure" (08/28/2012)  . Type 1 diabetes ([place]) 10/1949  . Stomach ulcer 1981?    "on Tagament for ~ 1 yr" in 31s  . Seizure ([place])     ? seizure like event in 2010; workup included stress testing which led to repeat cath.   . Arthritis     "hands, hips, all over" (08/28/2012  . Osteoarthritis of both feet   . Breast cancer ([place])     left  .  Cellulitis June 2013, Nov. 2013 and Feb. 14, 2014    Left leg  . Osteomyelitis ([place])     left foot   Past Surgical History:  Past Surgical History  Procedure Laterality Date  . Carpal tunnel release  ~ 1970's    bilaterally  . Mastectomy  11/1982    Left Breast  . Vitrectomy  1990's-2004    "both eyes; I've had total of 4"  . Tubal ligation  1984  . Tonsillectomy  1952    "?adenoids"   . Appendectomy  1991  . Femoral bypass      left leg "related to transmetatarsal amputation" (08/28/2012)  . Breast biopsy with sentinel lymph node biopsy and needle localization  1984  . Coronary artery  bypass graft  1992    LIMA to LAD, SVG to distal LCX & SVG to Marginal  . Cardiac catheterization  2010    LIMA to LAD patent yet with occluded distal LAD after graft insertion & retrograde is occluded. 3-4 septal perforators arise &are patent. SVG to a large marginal patent; SVG presumably to the distal dominant LCX is occluded, moderately high grade stenosis of the AV circumflex, but with distal stenoses involving a severely diseased marginal branch not amenable  to PCI  nor is inferior branch   . Cardiac catheterization  10/2011    "unsuccessful attempt of stenting" (08/28/2012)  . Cardiac catheterization  1992; 09/2011  . Breast biopsy    . Trigger finger release  1980's    right thumb  . Incision and drainage of wound  ~ 1967    "infection in left foot" (08/28/2012)  . Incision and drainage of wound  2009    "3 ORs on my right hand after cat bite" (08/28/2012)  . 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 Cataract extraction w/ intraocular lens  implant, bilateral  1990's  . Amputation Right 05/27/2013    Procedure: AMPUTATION DIGIT FIFTH TOE;  Surgeon: Rosetta Posner, MD;  Location: [facility];  Service: Vascular;  Laterality: Right;  . Left and right heart catheterization with coronary angiogram N/A 09/17/2011    Procedure: LEFT AND RIGHT HEART CATHETERIZATION WITH CORONARY ANGIOGRAM;  Surgeon: Hillary Bow, MD;  Location: [facility] CATH LAB;  Service: Cardiovascular;  Laterality: N/A;  . Percutaneous coronary stent intervention (pci-s) N/A 11/15/2011    Procedure: PERCUTANEOUS CORONARY STENT INTERVENTION (PCI-S);  Surgeon: Hillary Bow, MD;  Location[facility] CATH LAB;  Service: Cardiovascular;  Laterality: N/A;  . Lower extremity angiogram N/A 05/25/2013    Procedure: LOWER EXTREMITY ANGIOGRAM POSS INTERVENTION;  Surgeon: Conrad Omao, MD;  Location: [facility] CATH LAB;  Service: Cardiovascular;  Laterality: N/A;  . Amputation Left 03/21/2016    Procedure: Left Transmetatarsal  Amputation;  Surgeon: Newt Minion, MD;  Location: [facility];  Service: Orthopedics;  Laterality: Left;   Social History:  reports that she has never smoked. She has never used smokeless tobacco. She reports that she does not drink alcohol or use illicit drugs.  Family / Support Systems Marital Status: Single Patient Roles: Other (Comment) (sibling & friend) Other Supports: Wylene Simmer [id number]  [phone] Anticipated Caregiver: Self and friends Ability/Limitations of Caregiver: Only has intermittent assist via friends Caregiver Availability: Intermittent Family Dynamics: Pt has two close friends who are willing to assist her and come by daily to check on her. She has been dealing wiht her foot for a while now and has problem solved  her way around her home and making sure she is safe when pushing her rollator.  Social History Preferred language: English Religion: Quaker Cultural Background: No issues Education: Secretary/administrator educated Read: Yes Write: Yes Employment Status: Retired Freight forwarder Issues: No issues Guardian/Conservator: none-according to MD pt is capable of making her own decisions while here   Abuse/Neglect Physical Abuse: Denies Verbal Abuse: Denies Sexual Abuse: Denies Exploitation of patient/patient's resources: Denies Self-Neglect: Denies  Emotional Status Pt's affect, behavior adn adjustment status: Pt is moptivated to get out fo her by tomorrow, she feels she is ready and can function at home independently. She will have her friends check on her and provide what little care she will need and what she can not do for herself. WIll try steps later to see if realistic to go home by car. Recent Psychosocial Issues: other health issues she manages them at home Pyschiatric History: No history pt very self sufficient and priodes herself on this. Will not be here long enough to see neuro-psych Substance Abuse History: None  Patient /  Family Perceptions, Expectations & Goals Pt/Family understanding of illness & functional limitations: Pt can explain her surgery and treatment plan, her goal is to get home tomorrow and manage. She does talk with her MD and feels her questions have ben addressed. Premorbid pt/family roles/activities: Sister, Friend, church member, etc Anticipated changes in roles/activities/participation: resume Pt/family expectations/goals: Pt states: " I want to be at home I sleep better and can manage my diabetes there."  " My plan was to stay here for four days then go home."  [employer]: Other (Comment) Premorbid Home Care/DME Agencies: Other (Comment) (had [facility] in the past and prefers them again) Transportation available at discharge: Self and friends take to appointments.  Discharge Planning Living Arrangements: Alone Support Systems: Friends/neighbors, Other relatives Type of Residence: Private residence Insurance Resources: [employer] Financial Resources: Social Security Financial Screen Referred: No Living Expenses: Own Money Management: Patient Does the patient have any problems obtaining your medications?: No Home Management: Self Patient/Family Preliminary Plans: Return home with intermittent assist from friends, needs to be mod/i level. Will do what she can to be safe at home alone. Short length of stay due to pt's preference. Social Work Anticipated Follow Up Needs: HH/OP  Clinical Impression Very independent patient who has been taking care of herself and her health issues for years. She pl;ans to leave tomorrow and feels she can manage at home alone. She is willing to listen to team's input but may or may not Follow through on their suggestions. Will work on a safe discharge plan for her.  Elease Hashimoto 03/26/2016, 1:32 PM

## 2016-03-26 NOTE — Progress Notes (Signed)
Patient's dressing on left foot was soaked thorough with blood and was wet to touch.  Dressing changed--incision intact with sutures in place and lateral aspect noted to be oozing fresh blood.  Arch of foot looks pale and macerated. Wound cleansed, dry compressive dressing applied. Left message at office for Dr. Sharol Given to evaluate wound prior to discharge tomorrow and to clarify WB status as patient is unable to maintain NWB and feels that she was cleared to WB thorough left heel.

## 2016-03-26 NOTE — Progress Notes (Signed)
Social Work Patient ID: [name], female   DOB: 03-19-1946, 70 y.o.   MRN: [id number] Per pt request/demand she plans to go home tomorrow. Have checked with MD he feels she is medically stable and team feels shoe would benefit from staying 7-10 days But pt will not agree and has her own way of doing things and plans to continue this at discharge. She is going home via ambulance and aware it is private pay. Will work on discharge.

## 2016-03-26 NOTE — Progress Notes (Signed)
Occupational Therapy Session Note  Patient Details  Name: [name] MRN: [id number] Date of Birth: Oct 08, 1946  Today's Date: 03/26/2016 OT Individual Time: [id number] OT Individual Time Calculation (min): 72 min    Short Term Goals: Week 1:  OT Short Term Goal 1 (Week 1): STG=LTG d/t short LOS  Skilled Therapeutic Interventions/Progress Updates:    Upon exiting the room, pt seated in wheelchair with no c/o pain this session. Pt on phone with family discussing discharge plans. OT educated pt on current OT goals and discussion regarding discharge recommendations. Pt clearly stated, " I know you have to legally tell me the correct way to do these things but I'm gonna do it how I want to once it get home." OT educated pt on consequences of doing things that are not recommended by therapist for safety. Pt performed squat pivot transfer to bed from wheelchair with supervision. OT educated and demonstrated use of reacher in order to increase I with LB self care. Pt able to don elastic waist pants with reacher and stood while alternating hands on rollator to pull pants over each hip. Pt needing assistance to don L darco boot and noted blood through drainage with RN notified.  Pt returning to wheelchair in same manner and educated on energy conservation techniques for self care. Education to continue. Call bell and all needed items within reach.   Therapy Documentation Precautions:  Precautions Precautions: Fall Required Braces or Orthoses: Other Brace/Splint Other Brace/Splint: post op shoe Restrictions Weight Bearing Restrictions: Yes LLE Weight Bearing: Non weight bearing Vital Signs: Therapy Vitals Temp: 97.9 F (36.6 C) Temp Source: Oral Pulse Rate: 77 Resp: 17 BP: (!) 124/47 mmHg Patient Position (if appropriate): Sitting Oxygen Therapy SpO2: 100 % ADL: ADL ADL Comments: see Functional Assessment Tool  See Function Navigator for Current Functional  Status.   Therapy/Group: Individual Therapy  Phineas Semen 03/26/2016, 4:37 PM

## 2016-03-27 LAB — GLUCOSE, CAPILLARY
Glucose-Capillary: 103 mg/dL — ABNORMAL HIGH (ref 65–99)
Glucose-Capillary: 155 mg/dL — ABNORMAL HIGH (ref 65–99)

## 2016-03-27 MED ORDER — DOCUSATE SODIUM 50 MG/5ML PO LIQD: 50.0000 mg | Freq: Every day | ORAL | Status: DC | PRN

## 2016-03-27 NOTE — Progress Notes (Signed)
Pt discharged to home. Items retrieved from security. Pt trasported home via [facility]. Pt's belongings sent home with friend.

## 2016-03-27 NOTE — Progress Notes (Signed)
Social Work  Discharge Note  The overall goal for the admission was met for:   Discharge location: Yes-HOME WITH INTERMITTENT ASSIST FROM FRIENDS & COUSIN  Length of Stay: Yes-4 DAYS  Discharge activity level: Yes-SUPERVISION/MOD/I WHEELCHAIR LEVEL  Home/community participation: Yes  Services provided included: MD, RD, PT, OT, RN, CM, Pharmacy and SW  Financial Services: Medicare  Follow-up services arranged: Home Health: [facility]-PT, OT, RN, DME: [place] and Patient/Family request agency HH: PREF HAD BEFORE, DME: PREF HAD BEFORE  Comments (or additional information):PT FEELS SHE CAN MANAGE AT [place] Tuesday.  Patient/Family verbalized understanding of follow-up arrangements: Yes  Individual responsible for coordination of the follow-up plan: SELF  Confirmed correct DME delivered: Elease Hashimoto 03/27/2016    Elease Hashimoto

## 2016-03-27 NOTE — Discharge Instructions (Signed)
Inpatient Rehab Discharge Instructions  [name] Discharge date and time: 03/27/16   Activities/Precautions/ Functional Status: Activity: activity as tolerated No weight on left foot Diet: diabetic diet Wound Care: Cleanse with normal saline. Cover with 4 X  4 and dry compressive dressing. Change daily and more frequently if soiled. Contact MD if you notice any redness, swelling,  Increase in drainage or pus, fever or chills.    Functional status:  ___ No restrictions     ___ Walk up steps independently _X__ 24/7 supervision/assistance   ___ Walk up steps with assistance ___ Intermittent supervision/assistance  ___ Bathe/dress independently ___ Walk with walker     _X__ Bathe/dress with assistance ___ Walk Independently    ___ Shower independently ___ Walk with assistance    ___ Shower with assistance _X__ No alcohol     ___ Return to work/school ________   Special Instructions: 1. Check blood sugars before meals and at bedtime. 2. You need to find a primary care physician.   COMMUNITY REFERRALS UPON DISCHARGE:    Home Health:   PT, OT & RN    [place]   Date of last service:03/27/2016   Medical Equipment/Items [place]   [phone]   GENERAL COMMUNITY RESOURCES FOR PATIENT/FAMILY: Support Groups:[facility] SUPPORT GROUP [facility] Tuesday EACH MONTH @ 7:00 PM AT [place] [phone]  My questions have been answered and I understand these instructions. I will adhere to these goals and the provided educational materials after my discharge from the hospital.  Patient/Caregiver Signature _______________________________ Date __________  Clinician Signature _______________________________________ Date __________  Please bring this form and your medication list with you to all your follow-up doctor's appointments.

## 2016-03-27 NOTE — Progress Notes (Signed)
[facility] PHYSICAL MEDICINE & REHABILITATION     PROGRESS NOTE    Subjective/Complaints: Patient seen sitting up in bed this morning about to eat breakfast. She notes yesterday her dressing was changed due to saturation undressing. She also states that she was under the impression she was allowed to touch her foot to the floor, but states that she knows she is not to bear weight.  ROS: Denies CP, SOB, N/V/D.   Objective: Vital Signs: Blood pressure 139/51, pulse 70, temperature 97.6 F (36.4 C), temperature source Oral, resp. rate 18, weight 76.613 kg (168 lb 14.4 oz), SpO2 97 %. No results found. No results for input(s): WBC, HGB, HCT, PLT in the last 72 hours.  Recent Labs  03/24/16 1446 03/25/16 0552  NA 134* 134*  K 4.5 4.5  CL 100* 102  GLUCOSE 82 160*  BUN 43* 42*  CREATININE 1.87* 1.83*  CALCIUM 9.3 9.0   CBG (last 3)   Recent Labs  03/26/16 1641 03/26/16 2036 03/27/16 0705  GLUCAP 189* 102* 103*    Wt Readings from Last 3 Encounters:  03/27/16 76.613 kg (168 lb 14.4 oz)  03/21/16 77.111 kg (170 lb)  03/19/16 77.474 kg (170 lb 12.8 oz)    Physical Exam:  Constitutional: She appears well-developed and well-nourished. NAD. HENT: Normocephalic and atraumatic.  Eyes: Conjunctivae and EOM are normal.   Cardiovascular: Normal rate and regular rhythm. Murmur heard. Respiratory: Effort normal and breath sounds normal. No stridor. No respiratory distress. She has no wheezes.  GI: Soft. Bowel sounds are normal. She exhibits no distension. There is no tenderness.  Musculoskeletal: She exhibits edema LLE.  Neurological: She is alert and oriented.   B/L UE: 5/5 proximal to distal.  LLE: 4/5 proximal to distal, left ankle limited due to pain RLE 4/5 proximal to distal  Skin: Skin is warm and dry.  TMA with moderate amount of serosanguineous dressing from lateral aspect of wound.   Psychiatric: She has a normal mood and affect. Her behavior is normal.    Assessment/Plan: 1. Mobility and functional deficits secondary to left TMA which require 3+ hours per day of interdisciplinary therapy in a comprehensive inpatient rehab setting. Physiatrist is providing close team supervision and 24 hour management of active medical problems listed below. Physiatrist and rehab team continue to assess barriers to discharge/monitor patient progress toward functional and medical goals.  Function:  Bathing Bathing position Bathing activity did not occur: Refused Position: Wheelchair/chair at sink  Bathing parts Body parts bathed by patient: Right arm, Left arm, Chest, Abdomen, Front perineal area, Right upper leg, Left upper leg, Buttocks Body parts bathed by helper: Back  Bathing assist Assist Level: Touching or steadying assistance(Pt > 75%)      Upper Body Dressing/Undressing Upper body dressing   What is the patient wearing?: Pull over shirt/dress Bra - Perfomed by patient: Thread/unthread right bra strap, Thread/unthread left bra strap, Hook/unhook bra (pull down sports bra)   Pull over shirt/dress - Perfomed by patient: Thread/unthread right sleeve, Thread/unthread left sleeve, Put head through opening, Pull shirt over trunk          Upper body assist Assist Level: Set up   Set up : To obtain clothing/put away  Lower Body Dressing/Undressing Lower body dressing   What is the patient wearing?: Pants Underwear - Performed by patient: Pull underwear up/down Underwear - Performed by helper: Thread/unthread right underwear leg, Thread/unthread left underwear leg Pants- Performed by patient: Thread/unthread right pants leg, Thread/unthread left pants  leg, Pull pants up/down Pants- Performed by helper: Thread/unthread right pants leg, Thread/unthread left pants leg       Socks - Performed by helper: Don/doff right sock   Shoes - Performed by helper: Don/doff right shoe, Don/doff left shoe, Fasten left          Lower body assist Assist for  lower body dressing: Touching or steadying assistance (Pt > 75%)      Toileting Toileting   Toileting steps completed by patient: Adjust clothing prior to toileting, Performs perineal hygiene, Adjust clothing after toileting   Toileting Assistive Devices: Grab bar or rail  Toileting assist Assist level: Supervision or verbal cues   Transfers Chair/bed transfer   Chair/bed transfer method: Stand pivot Chair/bed transfer assist level: Supervision or verbal cues Chair/bed transfer assistive device: Armrests     Locomotion Ambulation     Max distance: 7 ft Assist level: Touching or steadying assistance (Pt > 75%)   Wheelchair   Type: Manual Max wheelchair distance: 20 ft Assist Level: Supervision or verbal cues  Cognition Comprehension Comprehension assist level: Understands complex 90% of the time/cues 10% of the time  Expression Expression assist level: Expresses complex 90% of the time/cues < 10% of the time  Social Interaction Social Interaction assist level: Interacts appropriately 90% of the time - Needs monitoring or encouragement for participation or interaction.  Problem Solving Problem solving assist level: Solves basic 90% of the time/requires cueing < 10% of the time  Memory Memory assist level: Recognizes or recalls 90% of the time/requires cueing < 10% of the time   Medical Problem List and Plan: 1. Mobility and functional deficits secondary to PAD LLE, s/p TMA  -DC today  -Dr. Sharol Given to clarify with patient regarding recommendations for weight bearing  -Emphasized importance of NWB 2. DVT Prophylaxis/Anticoagulation: Pharmaceutical: Lovenox 3. Pain Management: Tylenol prn effective.  4. Mood: LCSW to follow for evaluation and support.  5. Neuropsych: This patient is capable of making decisions on her own behalf. 6. Skin/Wound Care: Routine pressure relief measures. Elevation when seated for edema control. Plan was to Continue LLE compression wrap for approx one  to two weeks unless, however removed on 6/12 due to increased drainage.   -Dr. Sharol Given to evaluate today 7. Fluids/Electrolytes/Nutrition:   -sodium stable 134 on 6/11.  8. DM type 1 with nephropathy, neuropathy and retinopathy: Adjusting levemir to bid/home regimen for more consistent coverage. Monitor BS ac/hs and use SSI for elevated BS.   -Pt states she took Levamir 22U AM and 10U PM  -reduced back to 10u bid, overall improved   -changed SSI to meet her needs as she feels "hypoglycemic at 120 or below" 9. CAD/Chronic systolic CHF/Moderate AS: Monitor for signs of over load with daily weights. Continue Imdur at bedtime with lasix and Lipitor daily. Low salt diet.   -BNP elevated 683 on 6/11  -no signs of fluid overload  -will go back to her prior schedule of 60mg  alternating with 100mg  qod lasix   -Cr stable at 1.83 6/11  -Sodium improved on 6/11, 134  -pt states her ideal weight is around 165lb  Filed Weights   03/26/16 0619 03/27/16 0550 03/27/16 0600  Weight: 80.786 kg (178 lb 1.6 oz) 79.2 kg (174 lb 9.7 oz) 76.613 kg (168 lb 14.4 oz)  10. Pernicious anemia/iron deficiency anemia:  Hb 10.3 on 6/10 11. GERD: On Carafate.  12. CKD: Baseline Cr 1.6-1.7 range in the past few months.  Avoid nephrotoxic meds. See above  LOS (Days) 4 A FACE TO FACE EVALUATION WAS PERFORMED  Mohmed Farver Lorie Phenix 03/27/2016 8:24 AM

## 2016-03-27 NOTE — Progress Notes (Signed)
Physical Therapy Discharge Summary  Patient Details  Name: [name] MRN: [id number] Date of Birth: 1946/08/02  Today's Date: 03/27/2016 PT Individual Time: 0910-0943 PT Individual Time Calculation (min): 33 min    Patient has met 2 of 9 long term goals due to improved activity tolerance, increased strength and improved awareness.  Patient to discharge at a wheelchair level Supervision.   Patient's care partner unavailable to provide the necessary physical and cognitive assistance at discharge.  Reasons goals not met: Pt very eager to d/c home today even with PT education on benefits of remaining in CIR for continued pt education & to maximize her independence with functional mobility to ensure a safe d/c home alone.  Recommendation:  Patient will benefit from ongoing skilled PT services in home health setting to continue to advance safe functional mobility, address ongoing impairments in decreased safety awareness, to improve ability to maintain NWB LLE with all functional tasks, for cardiovascular endurance training, education on w/c parts management & mobility, transfer training to various surfaces, gait training with RW, stair negotiation, and minimize fall risk.  Equipment: 18x18 w/c, 3-in-1 BSC, EMS transport home  Reasons for discharge: Pt wishes to d/c home, but this therapist recommends that pt remain in CIR for continued pt education & mobility training.  Patient/family agrees with progress made and goals achieved: Yes   Skilled PT treatment: Pt received in bed & agreeable to PT, denying c/o pain. Pt reports she plans to take EMS transport home & is concerned about 3-in-1 BSC not fitting in her bathroom. Educated pt on use of BSC by bed or benefits of staying in CIR for recommended amount of time for further education & transfer training, but pt adamant about returning home soon. Pt voices no other concerns regarding d/c home. Pt is able to complete bed mobility without  bed features with Mod I & significantly extra time as pt is limited by poor cardiovascular endurance & back pain with task. Educated pt on continued NWB LLE & need to wear Darco shoe on LLE at all times when mobile. While sitting EOB pt unable to don shoe & DARCO boot while sitting at EOB but feels as though she will be able to do this while sitting on her slightly lower bed at home. Pt able to lock w/c by EOB with cuing from PT for placement of w/c. Pt completed squat pivot bed>w/c with supervision A & able to maintain NWB LLE.  Pt has poor depth perception & vision thus making w/c parts management difficult, especially donning leg rests.  Pt self propelled w/c a maximum of 50 ft with BUE & supervision before requiring rest break 2/2 fatigue & SOB. At end of session pt returned to room & left in w/c with all needs within reach. Pt pleased about d/c home today & feels comfortable doing so even. PT has provided maximum education regarding benefits of continued PT versus d/c home today, as well as education regarding home modifications in order to improve pt's safety at home.   PT Discharge Precautions/Restrictions Precautions Precautions: Fall Restrictions Weight Bearing Restrictions: Yes LLE Weight Bearing: Non weight bearing  Pain Pain Assessment Pain Assessment: No/denies pain  Vision/Perception    Pt wears glasses at baseline at all times. Pt reports slight increase in blurry vision since arrival to CIR, but pt attributes this to varying blood sugars, lack of sleep & food intake. Pt reports this happens when she's in hospital & expects vision to clear up upon return  home.   Cognition Overall Cognitive Status: Within Functional Limits for tasks assessed Orientation Level: Oriented X4;Oriented to person;Oriented to place;Oriented to time;Oriented to situation Attention: Focused Awareness: Appears intact Safety/Judgment: Impaired  Sensation Sensation Light Touch: Appears Intact  (BLE) Proprioception: Appears Intact (RLE) Coordination Gross Motor Movements are Fluid and Coordinated: Yes  Motor  Motor Motor:  (significant edema BLE)   Mobility Bed Mobility Bed Mobility: Rolling Right;Rolling Left;Supine to Sit;Sit to Supine Rolling Right: 6: Modified independent (Device/Increase time) Rolling Left: 6: Modified independent (Device/Increase time) Supine to Sit: 6: Modified independent (Device/Increase time) Transfers Transfers: Yes Squat Pivot Transfers: 5: Supervision Squat Pivot Transfer Details: Verbal cues for sequencing;Visual cues/gestures for sequencing  Locomotion  Ambulation Ambulation: No Gait Gait: No Stairs / Additional Locomotion Stairs: No Wheelchair Mobility Wheelchair Mobility: Yes Wheelchair Assistance: 5: Investment banker, operational Details: Verbal cues for sequencing;Verbal cues for technique;Visual cues/gestures for Astronomer: Both upper extremities Wheelchair Parts Management: Supervision/cueing Distance: 50 ft    Balance Balance Balance Assessed: Yes Static Sitting Balance Static Sitting - Balance Support: No upper extremity supported Static Sitting - Level of Assistance: 6: Modified independent (Device/Increase time)  Extremity Assessment  RLE Assessment RLE Assessment: Exceptions to [facility] RLE AROM (degrees) Overall AROM Right Lower Extremity: Within functional limits for tasks assessed RLE Strength Right Hip Flexion: 3+/5 Right Knee Flexion: 4+/5 Right Knee Extension: 4+/5 Right Ankle Dorsiflexion: 4/5 LLE Assessment LLE Assessment: Exceptions to WFL LLE AROM (degrees) Overall AROM Left Lower Extremity: Within functional limits for tasks assessed LLE Strength Left Hip Flexion: 3+/5 Left Knee Flexion:  (refused MMT) Left Knee Extension:  (refused MMT)   See Function Navigator for Current Functional Status.  Waunita Schooner 03/27/2016, 9:19 AM

## 2016-03-27 NOTE — Discharge Summary (Signed)
[name], [name]           ACCOUNT NO.:  [id number]  MEDICAL RECORD NO.:  [id number]  LOCATION:  4M06C                        FACILITY:  [facility]  PHYSICIAN:  Delice Lesch, MD        DATE OF BIRTH:  12-28-1945  DATE OF ADMISSION:  03/23/2016 DATE OF DISCHARGE:  03/27/2016                              DISCHARGE SUMMARY   DISCHARGE DIAGNOSES: 1. Mobility and functional deficits secondary to left transmetatarsal     amputation. 2. Subcutaneous Lovenox for DVT prophylaxis. 3. Pain management. 4. Diabetes mellitus, peripheral neuropathy. 5. Coronary artery disease with chronic congestive heart failure,     moderate aortic stenosis. 6. Pernicious anemia. 7. Gastroesophageal reflux disease. 8. Chronic renal insufficiency with baseline creatinine of 1.6-1.7.  HISTORY OF PRESENT ILLNESS:  This is a 70 year old female with history of hypertension, CAD with CABG, chronic systolic congestive heart failure, diabetes mellitus, peripheral vascular disease with multiple revascularization procedures.  Presented March 21, 2016, for left transmetatarsal amputation by Dr. Sharol Given.  Postoperative nonweightbearing on left lower extremity with Darco shoe, noted to be having difficulty with transfers as well.  Wound Care nurse followup.  Physical and occupational therapy ongoing.  The patient was admitted for comprehensive rehab program. She had increased drainage prior to discharge and was seen by Dr. Sharol Given, who recommended follow up as outpt.  PAST MEDICAL HISTORY:  See discharge diagnoses.  SOCIAL HISTORY:  Lives alone.  Retired, has a cousin in town and a neighbor who check on her.  Functional status upon admission to rehab services was minimal guard, stand pivot transfers.  Needed some encouragement at times to participate.  Min to mod assist for activities of daily living.  PHYSICAL EXAMINATION:  VITAL SIGNS:  Blood pressure 112/43, pulse 76, temperature 98, and respirations 18. GENERAL:   This was an alert female, in no acute distress, oriented x3. LUNGS:  Clear to auscultation without wheeze. CARDIAC:  Regular rate and rhythm without murmur. ABDOMEN:  Soft, nontender.  Good bowel sounds. EXTREMITIES:  TMA wound well approximated, substantial serous drainage on dressing.  Chronic vascular changes, right lower extremity as well as right toe amputation.  REHABILITATION HOSPITAL COURSE:  The patient was admitted to inpatient rehab services with therapies initiated on a 3-hour daily basis, consisting of physical therapy, occupational therapy, and rehabilitation nursing.  The following issues were addressed during the patient's rehabilitation stay.  Pertaining to Ms. Bogdanski's transmetatarsal amputation, surgical site healing nicely.  She still had some ischemic changes.  She would follow up with Dr. Sharol Given.  Subcutaneous Lovenox for DVT prophylaxis, no bleeding episodes.  Pain management with the use of Robaxin and oxycodone.  Diabetes mellitus type 1, peripheral neuropathy. Insulin therapy adjusted, diabetic teaching.  She would follow up with her primary care provider.  She had no chest pain or shortness of breath throughout her rehab course.  She exhibited no increasing signs of fluid overload.  She remained on Lasix which had been adjusted accordingly.  A BMP was noted to be 683.  Chronic renal insufficiency, remained stable. The patient received weekly collaborative interdisciplinary team conferences to discuss estimated length of stay, family teaching, any barriers to her discharge.  Darco shoe for left lower  extremity, nonweightbearing for the next couple of weeks.  Wound Care nurse consulted for all dressing changes with noted venous stasis changes. Supervision and rolling walker for household distances.  Wheelchair in the community.  She was able to participate with activities of daily living, dressing, grooming, and homemaking as well as stair climbing as needed.   Full family teaching was completed and plan discharge to home.  Latest labs showed a hemoglobin of 10.3, hematocrit 32.6.  Sodium 134, potassium 4.5, BUN 42, creatinine 1.83.  DISCHARGE MEDICATIONS: 1. Aspirin 325 mg p.o. daily. 2. Lipitor 40 mg p.o. daily. 3. Colace as needed. 4. Lasix 100 mg every 48 hours alternating with 60 mg every 48 hours. 5. NovoLog insulin 3 units t.i.d. 6. Levemir insulin 10 units b.i.d. 7. Imdur 30 mg p.o. daily. 8. Synthroid 100 mcg p.o. daily. 9. Robaxin 500 mg p.o. every 6 hours as needed muscle spasms, dispense     of 90 tablets. 10.Oxycodone immediate release 5-10 mg every 3 hours as needed     breakthrough pain, dispense of 90 tablets.  DIET:  2000 calorie ADA diet.  FOLLOWUP:  The patient would follow up with Dr. Posey Pronto at the outpatient rehab service office as needed; Dr. Meridee Score, 2 weeks call for appointment; follow up with PCP as directed.  The patient should follow up for close monitoring of renal function with chronic changes prior to hospital admission.     Lauraine Rinne, P.A.   ______________________________ Delice Lesch, MD    DA/MEDQ  D:  03/26/2016  T:  03/27/2016  Job:  [id number]  cc:   Newt Minion, MD

## 2016-03-27 NOTE — Plan of Care (Signed)
Problem: RH Balance Goal: LTG Patient will maintain dynamic standing balance (PT) LTG: Patient will maintain dynamic standing balance with assistance during mobility activities (PT)  Outcome: Not Met (add Reason) Due to decreased strength & balance.  Problem: RH Car Transfers Goal: LTG Patient will perform car transfers with assist (PT) LTG: Patient will perform car transfers with assistance (PT).  Outcome: Not Applicable Date Met:  12/52/47 Pt taking EMS transport home  Problem: RH Ambulation Goal: LTG Patient will ambulate in controlled environment (PT) LTG: Patient will ambulate in a controlled environment, # of feet with assistance (PT).  Outcome: Not Met (add Reason) Due to decreased strength, endurance & balance Goal: LTG Patient will ambulate in home environment (PT) LTG: Patient will ambulate in home environment, # of feet with assistance (PT).  Outcome: Not Met (add Reason) Due to decreased strength, endurance & balance  Problem: RH Wheelchair Mobility Goal: LTG Patient will propel w/c in controlled environment (PT) LTG: Patient will propel wheelchair in controlled environment, # of feet with assist (PT)  Outcome: Not Met (add Reason) 2/2 poor endurance Goal: LTG Patient will propel w/c in home environment (PT) LTG: Patient will propel wheelchair in home environment, # of feet with assistance (PT).  Outcome: Not Met (add Reason) Due to reduced safety awareness  Problem: RH Stairs Goal: LTG Patient will ambulate up and down stairs w/assist (PT) LTG: Patient will ambulate up and down # of stairs with assistance (PT)  Outcome: Not Met (add Reason) Pt refused; planning to take EMS transport home

## 2016-03-28 ENCOUNTER — Encounter: Payer: Self-pay | Admitting: Hematology and Oncology

## 2016-03-28 DIAGNOSIS — Z853 Personal history of malignant neoplasm of breast: Secondary | ICD-10-CM | POA: Diagnosis not present

## 2016-03-28 DIAGNOSIS — K219 Gastro-esophageal reflux disease without esophagitis: Secondary | ICD-10-CM | POA: Diagnosis not present

## 2016-03-28 DIAGNOSIS — E1051 Type 1 diabetes mellitus with diabetic peripheral angiopathy without gangrene: Secondary | ICD-10-CM | POA: Diagnosis not present

## 2016-03-28 DIAGNOSIS — D510 Vitamin B12 deficiency anemia due to intrinsic factor deficiency: Secondary | ICD-10-CM | POA: Diagnosis not present

## 2016-03-28 DIAGNOSIS — Z951 Presence of aortocoronary bypass graft: Secondary | ICD-10-CM | POA: Diagnosis not present

## 2016-03-28 DIAGNOSIS — Z4781 Encounter for orthopedic aftercare following surgical amputation: Secondary | ICD-10-CM | POA: Diagnosis not present

## 2016-03-28 DIAGNOSIS — E039 Hypothyroidism, unspecified: Secondary | ICD-10-CM | POA: Diagnosis not present

## 2016-03-28 DIAGNOSIS — Z794 Long term (current) use of insulin: Secondary | ICD-10-CM | POA: Diagnosis not present

## 2016-03-28 DIAGNOSIS — I252 Old myocardial infarction: Secondary | ICD-10-CM | POA: Diagnosis not present

## 2016-03-28 DIAGNOSIS — Z87440 Personal history of urinary (tract) infections: Secondary | ICD-10-CM | POA: Diagnosis not present

## 2016-03-28 DIAGNOSIS — E1042 Type 1 diabetes mellitus with diabetic polyneuropathy: Secondary | ICD-10-CM | POA: Diagnosis not present

## 2016-03-28 DIAGNOSIS — I5022 Chronic systolic (congestive) heart failure: Secondary | ICD-10-CM | POA: Diagnosis not present

## 2016-03-28 DIAGNOSIS — E785 Hyperlipidemia, unspecified: Secondary | ICD-10-CM | POA: Diagnosis not present

## 2016-03-28 DIAGNOSIS — I2510 Atherosclerotic heart disease of native coronary artery without angina pectoris: Secondary | ICD-10-CM | POA: Diagnosis not present

## 2016-03-28 DIAGNOSIS — M349 Systemic sclerosis, unspecified: Secondary | ICD-10-CM | POA: Diagnosis not present

## 2016-03-28 DIAGNOSIS — M150 Primary generalized (osteo)arthritis: Secondary | ICD-10-CM | POA: Diagnosis not present

## 2016-03-28 DIAGNOSIS — Z89422 Acquired absence of other left toe(s): Secondary | ICD-10-CM | POA: Diagnosis not present

## 2016-03-28 DIAGNOSIS — Z7982 Long term (current) use of aspirin: Secondary | ICD-10-CM | POA: Diagnosis not present

## 2016-03-28 DIAGNOSIS — I110 Hypertensive heart disease with heart failure: Secondary | ICD-10-CM | POA: Diagnosis not present

## 2016-03-29 ENCOUNTER — Other Ambulatory Visit: Payer: Self-pay | Admitting: Hematology and Oncology

## 2016-03-29 ENCOUNTER — Ambulatory Visit: Payer: Medicare Other

## 2016-03-29 DIAGNOSIS — I5022 Chronic systolic (congestive) heart failure: Secondary | ICD-10-CM | POA: Diagnosis not present

## 2016-03-29 DIAGNOSIS — I2510 Atherosclerotic heart disease of native coronary artery without angina pectoris: Secondary | ICD-10-CM | POA: Diagnosis not present

## 2016-03-29 DIAGNOSIS — M349 Systemic sclerosis, unspecified: Secondary | ICD-10-CM | POA: Diagnosis not present

## 2016-03-29 DIAGNOSIS — Z4781 Encounter for orthopedic aftercare following surgical amputation: Secondary | ICD-10-CM | POA: Diagnosis not present

## 2016-03-29 DIAGNOSIS — I110 Hypertensive heart disease with heart failure: Secondary | ICD-10-CM | POA: Diagnosis not present

## 2016-03-29 DIAGNOSIS — E1051 Type 1 diabetes mellitus with diabetic peripheral angiopathy without gangrene: Secondary | ICD-10-CM | POA: Diagnosis not present

## 2016-03-30 DIAGNOSIS — I2510 Atherosclerotic heart disease of native coronary artery without angina pectoris: Secondary | ICD-10-CM | POA: Diagnosis not present

## 2016-03-30 DIAGNOSIS — I5022 Chronic systolic (congestive) heart failure: Secondary | ICD-10-CM | POA: Diagnosis not present

## 2016-03-30 DIAGNOSIS — Z4781 Encounter for orthopedic aftercare following surgical amputation: Secondary | ICD-10-CM | POA: Diagnosis not present

## 2016-03-30 DIAGNOSIS — E1051 Type 1 diabetes mellitus with diabetic peripheral angiopathy without gangrene: Secondary | ICD-10-CM | POA: Diagnosis not present

## 2016-03-30 DIAGNOSIS — I110 Hypertensive heart disease with heart failure: Secondary | ICD-10-CM | POA: Diagnosis not present

## 2016-03-30 DIAGNOSIS — M349 Systemic sclerosis, unspecified: Secondary | ICD-10-CM | POA: Diagnosis not present

## 2016-03-31 DIAGNOSIS — I5022 Chronic systolic (congestive) heart failure: Secondary | ICD-10-CM | POA: Diagnosis not present

## 2016-03-31 DIAGNOSIS — E1051 Type 1 diabetes mellitus with diabetic peripheral angiopathy without gangrene: Secondary | ICD-10-CM | POA: Diagnosis not present

## 2016-03-31 DIAGNOSIS — I2510 Atherosclerotic heart disease of native coronary artery without angina pectoris: Secondary | ICD-10-CM | POA: Diagnosis not present

## 2016-03-31 DIAGNOSIS — Z4781 Encounter for orthopedic aftercare following surgical amputation: Secondary | ICD-10-CM | POA: Diagnosis not present

## 2016-03-31 DIAGNOSIS — I110 Hypertensive heart disease with heart failure: Secondary | ICD-10-CM | POA: Diagnosis not present

## 2016-03-31 DIAGNOSIS — M349 Systemic sclerosis, unspecified: Secondary | ICD-10-CM | POA: Diagnosis not present

## 2016-04-01 DIAGNOSIS — I5022 Chronic systolic (congestive) heart failure: Secondary | ICD-10-CM | POA: Diagnosis not present

## 2016-04-01 DIAGNOSIS — Z4781 Encounter for orthopedic aftercare following surgical amputation: Secondary | ICD-10-CM | POA: Diagnosis not present

## 2016-04-01 DIAGNOSIS — E1051 Type 1 diabetes mellitus with diabetic peripheral angiopathy without gangrene: Secondary | ICD-10-CM | POA: Diagnosis not present

## 2016-04-01 DIAGNOSIS — M349 Systemic sclerosis, unspecified: Secondary | ICD-10-CM | POA: Diagnosis not present

## 2016-04-01 DIAGNOSIS — I110 Hypertensive heart disease with heart failure: Secondary | ICD-10-CM | POA: Diagnosis not present

## 2016-04-01 DIAGNOSIS — I2510 Atherosclerotic heart disease of native coronary artery without angina pectoris: Secondary | ICD-10-CM | POA: Diagnosis not present

## 2016-04-03 DIAGNOSIS — E1051 Type 1 diabetes mellitus with diabetic peripheral angiopathy without gangrene: Secondary | ICD-10-CM | POA: Diagnosis not present

## 2016-04-03 DIAGNOSIS — I2510 Atherosclerotic heart disease of native coronary artery without angina pectoris: Secondary | ICD-10-CM | POA: Diagnosis not present

## 2016-04-03 DIAGNOSIS — M349 Systemic sclerosis, unspecified: Secondary | ICD-10-CM | POA: Diagnosis not present

## 2016-04-03 DIAGNOSIS — I110 Hypertensive heart disease with heart failure: Secondary | ICD-10-CM | POA: Diagnosis not present

## 2016-04-03 DIAGNOSIS — Z4781 Encounter for orthopedic aftercare following surgical amputation: Secondary | ICD-10-CM | POA: Diagnosis not present

## 2016-04-03 DIAGNOSIS — I5022 Chronic systolic (congestive) heart failure: Secondary | ICD-10-CM | POA: Diagnosis not present

## 2016-04-04 ENCOUNTER — Telehealth: Payer: Self-pay | Admitting: Internal Medicine

## 2016-04-04 DIAGNOSIS — R600 Localized edema: Secondary | ICD-10-CM

## 2016-04-04 DIAGNOSIS — I110 Hypertensive heart disease with heart failure: Secondary | ICD-10-CM | POA: Diagnosis not present

## 2016-04-04 DIAGNOSIS — Z79899 Other long term (current) drug therapy: Secondary | ICD-10-CM

## 2016-04-04 DIAGNOSIS — E1051 Type 1 diabetes mellitus with diabetic peripheral angiopathy without gangrene: Secondary | ICD-10-CM | POA: Diagnosis not present

## 2016-04-04 DIAGNOSIS — I2510 Atherosclerotic heart disease of native coronary artery without angina pectoris: Secondary | ICD-10-CM | POA: Diagnosis not present

## 2016-04-04 DIAGNOSIS — I5022 Chronic systolic (congestive) heart failure: Secondary | ICD-10-CM | POA: Diagnosis not present

## 2016-04-04 DIAGNOSIS — Z4781 Encounter for orthopedic aftercare following surgical amputation: Secondary | ICD-10-CM | POA: Diagnosis not present

## 2016-04-04 DIAGNOSIS — M349 Systemic sclerosis, unspecified: Secondary | ICD-10-CM | POA: Diagnosis not present

## 2016-04-04 NOTE — Telephone Encounter (Signed)
Spoke with patient who states she would like to take Lasix 100 mg daily for the next few days.  She states she has been alternating lasix 100 mg with 60 mg every other day per Dr. Harrington Challenger' advice from office visit on 6/5.  She states her foot is very swollen s/p foot surgery on 6/7 and the home health nurse agrees that additional lasix may help with the swelling.  She states her foot is weeping fluid.  We discussed her last creatinine because patient is very aware of the effect of lasix on the kidneys.  I advised her that I will forward message to Dr. Harrington Challenger for advice.  She is due to take 60 mg lasix tomorrow so her request is just to take 100 mg tomorrow and Saturday.  I advised that if Dr. Harrington Challenger replies today or tomorrow, we will call her with her advice.  She thanked me for my help.

## 2016-04-04 NOTE — Telephone Encounter (Signed)
New message     Pt c/o medication issue:  1. Name of Medication: Lasix 100 mg and Lasix 60 mg   2. How are you currently taking this medication (dosage and times per day)? 100 mg and 60 mg   3. Are you having a reaction (difficulty breathing--STAT)? no  4. What is your medication issue? It alternates every other now, the pt wants to increase the medication everyday the home health nurse is with the pt now.

## 2016-04-05 DIAGNOSIS — E1051 Type 1 diabetes mellitus with diabetic peripheral angiopathy without gangrene: Secondary | ICD-10-CM | POA: Diagnosis not present

## 2016-04-05 DIAGNOSIS — I110 Hypertensive heart disease with heart failure: Secondary | ICD-10-CM | POA: Diagnosis not present

## 2016-04-05 DIAGNOSIS — Z4781 Encounter for orthopedic aftercare following surgical amputation: Secondary | ICD-10-CM | POA: Diagnosis not present

## 2016-04-05 DIAGNOSIS — I5022 Chronic systolic (congestive) heart failure: Secondary | ICD-10-CM | POA: Diagnosis not present

## 2016-04-05 DIAGNOSIS — M349 Systemic sclerosis, unspecified: Secondary | ICD-10-CM | POA: Diagnosis not present

## 2016-04-05 DIAGNOSIS — I2510 Atherosclerotic heart disease of native coronary artery without angina pectoris: Secondary | ICD-10-CM | POA: Diagnosis not present

## 2016-04-05 NOTE — Telephone Encounter (Signed)
Left msg on pt's voicemail I told her to go ahead and take extra lasix 100 qd for a few days See if visiting nurse can draw labs Fri or Monday I am in office tomorrow and can take call

## 2016-04-05 NOTE — Telephone Encounter (Signed)
Spoke with patient who states she did receive Dr. Harrington Challenger' message and she will increase Lasix to 100 mg for a few days.  She states the home health nurse comes tomorrow between 4 - 5 pm.  I advised that Dr. Harrington Challenger would like bmet and patient added that she thought she would also like a BNP.  She states she will discuss with [facility] tomorrow and that she has an appointment with DR. Duda on Monday and she thinks he will order the lab work.  I advised that Dr. Harrington Challenger will be in the office tomorrow and to call with questions.  She verbalized understanding and agreement with plan.

## 2016-04-06 DIAGNOSIS — I5022 Chronic systolic (congestive) heart failure: Secondary | ICD-10-CM | POA: Diagnosis not present

## 2016-04-06 DIAGNOSIS — Z4781 Encounter for orthopedic aftercare following surgical amputation: Secondary | ICD-10-CM | POA: Diagnosis not present

## 2016-04-06 DIAGNOSIS — I110 Hypertensive heart disease with heart failure: Secondary | ICD-10-CM | POA: Diagnosis not present

## 2016-04-06 DIAGNOSIS — E1051 Type 1 diabetes mellitus with diabetic peripheral angiopathy without gangrene: Secondary | ICD-10-CM | POA: Diagnosis not present

## 2016-04-06 DIAGNOSIS — I2510 Atherosclerotic heart disease of native coronary artery without angina pectoris: Secondary | ICD-10-CM | POA: Diagnosis not present

## 2016-04-06 DIAGNOSIS — M349 Systemic sclerosis, unspecified: Secondary | ICD-10-CM | POA: Diagnosis not present

## 2016-04-06 NOTE — Telephone Encounter (Signed)
Faxed lab orders to Dr Jess Barters office

## 2016-04-06 NOTE — Telephone Encounter (Signed)
New message  Pt call stating she needed an order fax to Dr. Sharol Given office to complete the blood work that's to be done on Monday. Please call pt if discussion is needed.

## 2016-04-07 DIAGNOSIS — I2510 Atherosclerotic heart disease of native coronary artery without angina pectoris: Secondary | ICD-10-CM | POA: Diagnosis not present

## 2016-04-07 DIAGNOSIS — M349 Systemic sclerosis, unspecified: Secondary | ICD-10-CM | POA: Diagnosis not present

## 2016-04-07 DIAGNOSIS — E1051 Type 1 diabetes mellitus with diabetic peripheral angiopathy without gangrene: Secondary | ICD-10-CM | POA: Diagnosis not present

## 2016-04-07 DIAGNOSIS — Z4781 Encounter for orthopedic aftercare following surgical amputation: Secondary | ICD-10-CM | POA: Diagnosis not present

## 2016-04-07 DIAGNOSIS — I110 Hypertensive heart disease with heart failure: Secondary | ICD-10-CM | POA: Diagnosis not present

## 2016-04-07 DIAGNOSIS — I5022 Chronic systolic (congestive) heart failure: Secondary | ICD-10-CM | POA: Diagnosis not present

## 2016-04-10 ENCOUNTER — Encounter: Payer: Self-pay | Admitting: Internal Medicine

## 2016-04-10 DIAGNOSIS — E1051 Type 1 diabetes mellitus with diabetic peripheral angiopathy without gangrene: Secondary | ICD-10-CM | POA: Diagnosis not present

## 2016-04-10 DIAGNOSIS — I5022 Chronic systolic (congestive) heart failure: Secondary | ICD-10-CM | POA: Diagnosis not present

## 2016-04-10 DIAGNOSIS — Z4781 Encounter for orthopedic aftercare following surgical amputation: Secondary | ICD-10-CM | POA: Diagnosis not present

## 2016-04-10 DIAGNOSIS — M349 Systemic sclerosis, unspecified: Secondary | ICD-10-CM | POA: Diagnosis not present

## 2016-04-10 DIAGNOSIS — I110 Hypertensive heart disease with heart failure: Secondary | ICD-10-CM | POA: Diagnosis not present

## 2016-04-10 DIAGNOSIS — I2510 Atherosclerotic heart disease of native coronary artery without angina pectoris: Secondary | ICD-10-CM | POA: Diagnosis not present

## 2016-04-11 DIAGNOSIS — E1051 Type 1 diabetes mellitus with diabetic peripheral angiopathy without gangrene: Secondary | ICD-10-CM | POA: Diagnosis not present

## 2016-04-11 DIAGNOSIS — I110 Hypertensive heart disease with heart failure: Secondary | ICD-10-CM | POA: Diagnosis not present

## 2016-04-11 DIAGNOSIS — I5022 Chronic systolic (congestive) heart failure: Secondary | ICD-10-CM | POA: Diagnosis not present

## 2016-04-11 DIAGNOSIS — M349 Systemic sclerosis, unspecified: Secondary | ICD-10-CM | POA: Diagnosis not present

## 2016-04-11 DIAGNOSIS — I2510 Atherosclerotic heart disease of native coronary artery without angina pectoris: Secondary | ICD-10-CM | POA: Diagnosis not present

## 2016-04-11 DIAGNOSIS — Z4781 Encounter for orthopedic aftercare following surgical amputation: Secondary | ICD-10-CM | POA: Diagnosis not present

## 2016-04-12 ENCOUNTER — Encounter: Payer: Self-pay | Admitting: Internal Medicine

## 2016-04-12 ENCOUNTER — Telehealth: Payer: Self-pay | Admitting: *Deleted

## 2016-04-12 DIAGNOSIS — I5022 Chronic systolic (congestive) heart failure: Secondary | ICD-10-CM | POA: Diagnosis not present

## 2016-04-12 DIAGNOSIS — E1051 Type 1 diabetes mellitus with diabetic peripheral angiopathy without gangrene: Secondary | ICD-10-CM | POA: Diagnosis not present

## 2016-04-12 DIAGNOSIS — I2510 Atherosclerotic heart disease of native coronary artery without angina pectoris: Secondary | ICD-10-CM | POA: Diagnosis not present

## 2016-04-12 DIAGNOSIS — I110 Hypertensive heart disease with heart failure: Secondary | ICD-10-CM | POA: Diagnosis not present

## 2016-04-12 DIAGNOSIS — M349 Systemic sclerosis, unspecified: Secondary | ICD-10-CM | POA: Diagnosis not present

## 2016-04-12 DIAGNOSIS — Z4781 Encounter for orthopedic aftercare following surgical amputation: Secondary | ICD-10-CM | POA: Diagnosis not present

## 2016-04-12 NOTE — Telephone Encounter (Signed)
She could try short course tessalon pearls for cough

## 2016-04-12 NOTE — Telephone Encounter (Signed)
Received phone call from [facility] with [facility] who is going out to visit patient today.  The patient told the nurse about dry, hacking, non productive cough that she has had for the past couple weeks. The nurse states that on her last visit this week the patient's lungs were clear and the only time she gets SOB is with this coughing.  Has been afebrile.  Caitlin asked if Dr. Harrington Challenger would be willing to see her or prescribe something for her for this cough. Advised that patient may need to go to urgent care to be evaluated for cough, since patient does not have a PCP, but that I will forward to Dr. Harrington Challenger as Juluis Rainier.  She also reports that patients would (partial foot amputation) has a daily dressing change and looked macerated but otherwise is clean.

## 2016-04-13 DIAGNOSIS — I5022 Chronic systolic (congestive) heart failure: Secondary | ICD-10-CM | POA: Diagnosis not present

## 2016-04-13 DIAGNOSIS — Z4781 Encounter for orthopedic aftercare following surgical amputation: Secondary | ICD-10-CM | POA: Diagnosis not present

## 2016-04-13 DIAGNOSIS — I2510 Atherosclerotic heart disease of native coronary artery without angina pectoris: Secondary | ICD-10-CM | POA: Diagnosis not present

## 2016-04-13 DIAGNOSIS — I110 Hypertensive heart disease with heart failure: Secondary | ICD-10-CM | POA: Diagnosis not present

## 2016-04-13 DIAGNOSIS — M349 Systemic sclerosis, unspecified: Secondary | ICD-10-CM | POA: Diagnosis not present

## 2016-04-13 DIAGNOSIS — E1051 Type 1 diabetes mellitus with diabetic peripheral angiopathy without gangrene: Secondary | ICD-10-CM | POA: Diagnosis not present

## 2016-04-13 MED ORDER — BENZONATATE 100 MG PO CAPS
100.0000 mg | ORAL_CAPSULE | Freq: Three times a day (TID) | ORAL | Status: DC | PRN
Start: 2016-04-13 — End: 2016-07-05

## 2016-04-13 NOTE — Telephone Encounter (Signed)
Spoke to patient and informed prescription for tessalon perles 100 mg TID prn sent to pharmacy per Dr. Harrington Challenger. Questioned patient again about symptoms since she stated that "everyone has told me this is related to my CHF".  However, patient's weight continues to trend down; she is not especially SOB unless coughing.  No wheezing.  Reports cough is dry and hacking.  Reports appetite has been poor and I advised on importance of nutrition and proper wound healing.  She states the foods she eats are ones with protein and she does not like/want ensure or smoothies made at home with protein powder.  Discussed importance of establishing with PCP.

## 2016-04-18 DIAGNOSIS — I5022 Chronic systolic (congestive) heart failure: Secondary | ICD-10-CM | POA: Diagnosis not present

## 2016-04-18 DIAGNOSIS — I2510 Atherosclerotic heart disease of native coronary artery without angina pectoris: Secondary | ICD-10-CM | POA: Diagnosis not present

## 2016-04-18 DIAGNOSIS — I110 Hypertensive heart disease with heart failure: Secondary | ICD-10-CM | POA: Diagnosis not present

## 2016-04-18 DIAGNOSIS — Z4781 Encounter for orthopedic aftercare following surgical amputation: Secondary | ICD-10-CM | POA: Diagnosis not present

## 2016-04-18 DIAGNOSIS — E1051 Type 1 diabetes mellitus with diabetic peripheral angiopathy without gangrene: Secondary | ICD-10-CM | POA: Diagnosis not present

## 2016-04-18 DIAGNOSIS — M349 Systemic sclerosis, unspecified: Secondary | ICD-10-CM | POA: Diagnosis not present

## 2016-04-19 DIAGNOSIS — Z4781 Encounter for orthopedic aftercare following surgical amputation: Secondary | ICD-10-CM | POA: Diagnosis not present

## 2016-04-19 DIAGNOSIS — I2510 Atherosclerotic heart disease of native coronary artery without angina pectoris: Secondary | ICD-10-CM | POA: Diagnosis not present

## 2016-04-19 DIAGNOSIS — E1051 Type 1 diabetes mellitus with diabetic peripheral angiopathy without gangrene: Secondary | ICD-10-CM | POA: Diagnosis not present

## 2016-04-19 DIAGNOSIS — I5022 Chronic systolic (congestive) heart failure: Secondary | ICD-10-CM | POA: Diagnosis not present

## 2016-04-19 DIAGNOSIS — M349 Systemic sclerosis, unspecified: Secondary | ICD-10-CM | POA: Diagnosis not present

## 2016-04-19 DIAGNOSIS — I110 Hypertensive heart disease with heart failure: Secondary | ICD-10-CM | POA: Diagnosis not present

## 2016-04-20 DIAGNOSIS — Z4781 Encounter for orthopedic aftercare following surgical amputation: Secondary | ICD-10-CM | POA: Diagnosis not present

## 2016-04-20 DIAGNOSIS — I2510 Atherosclerotic heart disease of native coronary artery without angina pectoris: Secondary | ICD-10-CM | POA: Diagnosis not present

## 2016-04-20 DIAGNOSIS — I110 Hypertensive heart disease with heart failure: Secondary | ICD-10-CM | POA: Diagnosis not present

## 2016-04-20 DIAGNOSIS — I5022 Chronic systolic (congestive) heart failure: Secondary | ICD-10-CM | POA: Diagnosis not present

## 2016-04-20 DIAGNOSIS — E1051 Type 1 diabetes mellitus with diabetic peripheral angiopathy without gangrene: Secondary | ICD-10-CM | POA: Diagnosis not present

## 2016-04-20 DIAGNOSIS — M349 Systemic sclerosis, unspecified: Secondary | ICD-10-CM | POA: Diagnosis not present

## 2016-04-24 ENCOUNTER — Telehealth: Payer: Self-pay | Admitting: Internal Medicine

## 2016-04-24 DIAGNOSIS — Z4781 Encounter for orthopedic aftercare following surgical amputation: Secondary | ICD-10-CM | POA: Diagnosis not present

## 2016-04-24 DIAGNOSIS — I2510 Atherosclerotic heart disease of native coronary artery without angina pectoris: Secondary | ICD-10-CM | POA: Diagnosis not present

## 2016-04-24 DIAGNOSIS — E1051 Type 1 diabetes mellitus with diabetic peripheral angiopathy without gangrene: Secondary | ICD-10-CM | POA: Diagnosis not present

## 2016-04-24 DIAGNOSIS — M349 Systemic sclerosis, unspecified: Secondary | ICD-10-CM | POA: Diagnosis not present

## 2016-04-24 DIAGNOSIS — I5022 Chronic systolic (congestive) heart failure: Secondary | ICD-10-CM | POA: Diagnosis not present

## 2016-04-24 DIAGNOSIS — I110 Hypertensive heart disease with heart failure: Secondary | ICD-10-CM | POA: Diagnosis not present

## 2016-04-24 NOTE — Telephone Encounter (Signed)
Pt states Dr Harrington Challenger was aware pt was taking lasix 100 mg daily instead of 100 mg daily alternating with 60 mg daily for 5 -6 days last week.   Pt states she decreased dose to usual dose of 100 mg daily alternating with 60 mg daily a couple of days ago.   Pt states that she began to feel fluid increase in her upper legs from knees to thighs and lower abdomen 2 days ago. Pt states that she is unable to weigh daily. Pt states  her shortness of breath is about the same  Pt states that she went back to dose of lasix 100 mg daily about 2 days ago because she felt she was holding on to extra fluid.  Pt states she is going to take lasix 100 mg daily for 5 days, then decrease back to 100 mg daily alternating with 60mg  daily. Pt states if Dr Harrington Challenger orders an follow up lab that she still has Home Health Nurse and they could do any lab Dr Harrington Challenger may order.  Pt advised I will forward to Dr Harrington Challenger for review.

## 2016-04-24 NOTE — Telephone Encounter (Signed)
New message      Pt c/o medication issue:  1. Name of Medication: Lasix  2. How are you currently taking this medication (dosage and times per day)? 60 mg po daily alternating the day is how the pt should take the medication.  3. Are you having a reaction (difficulty breathing--STAT)? no  4. What is your medication issue? The pt decided to take 100 mg po daily per physical therapist, per therapist the pt stated she wanted to get more fluid. The physical therapist states the physician should be aware of this.   The physical therapist states the pt is having a hard time getting up and down she is asking for the MD to order a temporary ramp for the pt to use to see if Dr. Harrington Challenger could send a fax order in [phone]. The pt is very short breath.

## 2016-04-24 NOTE — Telephone Encounter (Signed)
Yes, they should get a BMET and BNP

## 2016-04-26 DIAGNOSIS — E1051 Type 1 diabetes mellitus with diabetic peripheral angiopathy without gangrene: Secondary | ICD-10-CM | POA: Diagnosis not present

## 2016-04-26 DIAGNOSIS — I110 Hypertensive heart disease with heart failure: Secondary | ICD-10-CM | POA: Diagnosis not present

## 2016-04-26 DIAGNOSIS — I5022 Chronic systolic (congestive) heart failure: Secondary | ICD-10-CM | POA: Diagnosis not present

## 2016-04-26 DIAGNOSIS — I2510 Atherosclerotic heart disease of native coronary artery without angina pectoris: Secondary | ICD-10-CM | POA: Diagnosis not present

## 2016-04-26 DIAGNOSIS — Z4781 Encounter for orthopedic aftercare following surgical amputation: Secondary | ICD-10-CM | POA: Diagnosis not present

## 2016-04-26 DIAGNOSIS — M349 Systemic sclerosis, unspecified: Secondary | ICD-10-CM | POA: Diagnosis not present

## 2016-04-26 NOTE — Telephone Encounter (Signed)
Spoke with patient. The home care nurse is on her way to the house now.  Advised patient that Dr. Harrington Challenger wants nurse to draw lab work. She will ask the home care nurse Fara Chute) to call the office and speak with me.

## 2016-04-26 NOTE — Telephone Encounter (Signed)
Home care nurse called back. Informed that Dr. Harrington Challenger has requested lab work (BMET and BNP)   Urban Gibson will draw today and will fax results to [phone].

## 2016-04-27 DIAGNOSIS — M349 Systemic sclerosis, unspecified: Secondary | ICD-10-CM | POA: Diagnosis not present

## 2016-04-27 DIAGNOSIS — I2510 Atherosclerotic heart disease of native coronary artery without angina pectoris: Secondary | ICD-10-CM | POA: Diagnosis not present

## 2016-04-27 DIAGNOSIS — I5022 Chronic systolic (congestive) heart failure: Secondary | ICD-10-CM | POA: Diagnosis not present

## 2016-04-27 DIAGNOSIS — E1051 Type 1 diabetes mellitus with diabetic peripheral angiopathy without gangrene: Secondary | ICD-10-CM | POA: Diagnosis not present

## 2016-04-27 DIAGNOSIS — Z4781 Encounter for orthopedic aftercare following surgical amputation: Secondary | ICD-10-CM | POA: Diagnosis not present

## 2016-04-27 DIAGNOSIS — I110 Hypertensive heart disease with heart failure: Secondary | ICD-10-CM | POA: Diagnosis not present

## 2016-05-01 ENCOUNTER — Encounter: Payer: Self-pay | Admitting: Internal Medicine

## 2016-05-01 DIAGNOSIS — I2510 Atherosclerotic heart disease of native coronary artery without angina pectoris: Secondary | ICD-10-CM | POA: Diagnosis not present

## 2016-05-01 DIAGNOSIS — Z4781 Encounter for orthopedic aftercare following surgical amputation: Secondary | ICD-10-CM | POA: Diagnosis not present

## 2016-05-01 DIAGNOSIS — I110 Hypertensive heart disease with heart failure: Secondary | ICD-10-CM | POA: Diagnosis not present

## 2016-05-01 DIAGNOSIS — M349 Systemic sclerosis, unspecified: Secondary | ICD-10-CM | POA: Diagnosis not present

## 2016-05-01 DIAGNOSIS — E1051 Type 1 diabetes mellitus with diabetic peripheral angiopathy without gangrene: Secondary | ICD-10-CM | POA: Diagnosis not present

## 2016-05-01 DIAGNOSIS — I5022 Chronic systolic (congestive) heart failure: Secondary | ICD-10-CM | POA: Diagnosis not present

## 2016-05-03 DIAGNOSIS — I5022 Chronic systolic (congestive) heart failure: Secondary | ICD-10-CM | POA: Diagnosis not present

## 2016-05-03 DIAGNOSIS — M349 Systemic sclerosis, unspecified: Secondary | ICD-10-CM | POA: Diagnosis not present

## 2016-05-03 DIAGNOSIS — I2510 Atherosclerotic heart disease of native coronary artery without angina pectoris: Secondary | ICD-10-CM | POA: Diagnosis not present

## 2016-05-03 DIAGNOSIS — I110 Hypertensive heart disease with heart failure: Secondary | ICD-10-CM | POA: Diagnosis not present

## 2016-05-03 DIAGNOSIS — E1051 Type 1 diabetes mellitus with diabetic peripheral angiopathy without gangrene: Secondary | ICD-10-CM | POA: Diagnosis not present

## 2016-05-03 DIAGNOSIS — Z4781 Encounter for orthopedic aftercare following surgical amputation: Secondary | ICD-10-CM | POA: Diagnosis not present

## 2016-05-08 ENCOUNTER — Telehealth: Payer: Self-pay

## 2016-05-08 DIAGNOSIS — M349 Systemic sclerosis, unspecified: Secondary | ICD-10-CM | POA: Diagnosis not present

## 2016-05-08 DIAGNOSIS — I110 Hypertensive heart disease with heart failure: Secondary | ICD-10-CM | POA: Diagnosis not present

## 2016-05-08 DIAGNOSIS — I739 Peripheral vascular disease, unspecified: Secondary | ICD-10-CM

## 2016-05-08 DIAGNOSIS — Z4781 Encounter for orthopedic aftercare following surgical amputation: Secondary | ICD-10-CM | POA: Diagnosis not present

## 2016-05-08 DIAGNOSIS — I5022 Chronic systolic (congestive) heart failure: Secondary | ICD-10-CM | POA: Diagnosis not present

## 2016-05-08 DIAGNOSIS — E1051 Type 1 diabetes mellitus with diabetic peripheral angiopathy without gangrene: Secondary | ICD-10-CM | POA: Diagnosis not present

## 2016-05-08 DIAGNOSIS — I2510 Atherosclerotic heart disease of native coronary artery without angina pectoris: Secondary | ICD-10-CM | POA: Diagnosis not present

## 2016-05-08 DIAGNOSIS — S80821A Blister (nonthermal), right lower leg, initial encounter: Secondary | ICD-10-CM

## 2016-05-08 NOTE — Telephone Encounter (Signed)
Phone call from pt.  Reported she has developed 3  blisters on right lower leg with splotchy areas of redness surrounding the blisters.  Reported there has been some weeping from one of the blisters.  Stated the [facility] RN wrapped the right lower extremity with gauze and an ace wrap.  Denied fever or chills.  Reported she has been on Doxycycline x 2 weeks, for cellulitis of the left leg.  Also, reported she has had a lot more fluid retention in her trunk region and legs; stated her Lasix dose was adjusted, and the swelling has improved from last week.  Denied any pain in right leg at rest or with walking.  Denied any change in temperature or any numbness/ weakness of right LE.  Also reported the left TMA surgery site has been slow to heal.  Discussed pt's right LE symptoms with Dr. Donnetta Hutching.  Advised to start Silvadine cream to blisters of right LE, and 3 layer compression wrap to right LE to be applied in moderation.  Stated it is okay for the compression, but it should not be wrapped too tight; change compression wrap 3x/ week.  Advised the pt. Can be scheduled to either see another MD next week, in his absence, or can schedule appt. To see him on 05/17/16.  Notified pt. of Dr. Luther Parody recommendation.  Agrees with plan.  Advised pt. will contact  in AM with new orders.

## 2016-05-09 MED ORDER — SILVER SULFADIAZINE 1 % EX CREA: TOPICAL_CREAM | CUTANEOUS | 0 refills | Status: DC

## 2016-05-09 NOTE — Telephone Encounter (Signed)
Notified [place] of new orders for Silvadene cream to right LE blisters, and 3 Layer Compression Wrap 3 times/ week.  Will fax order to [phone].  Spoke with Seth Bake at [employer]; verb. Understanding of new orders.  Per Seth Bake, she will order the 3 Layer Compression Wrap, and it will not be available until 7/27 or 7/28.  Attempted to contact the pt. to inform of plan.  Left voice message that Silvadene cream will be ordered at her local pharmacy, and the 3 Layer compression wrap is being order by [facility].

## 2016-05-11 DIAGNOSIS — I5022 Chronic systolic (congestive) heart failure: Secondary | ICD-10-CM | POA: Diagnosis not present

## 2016-05-11 DIAGNOSIS — I2510 Atherosclerotic heart disease of native coronary artery without angina pectoris: Secondary | ICD-10-CM | POA: Diagnosis not present

## 2016-05-11 DIAGNOSIS — M349 Systemic sclerosis, unspecified: Secondary | ICD-10-CM | POA: Diagnosis not present

## 2016-05-11 DIAGNOSIS — Z4781 Encounter for orthopedic aftercare following surgical amputation: Secondary | ICD-10-CM | POA: Diagnosis not present

## 2016-05-11 DIAGNOSIS — I110 Hypertensive heart disease with heart failure: Secondary | ICD-10-CM | POA: Diagnosis not present

## 2016-05-11 DIAGNOSIS — E1051 Type 1 diabetes mellitus with diabetic peripheral angiopathy without gangrene: Secondary | ICD-10-CM | POA: Diagnosis not present

## 2016-05-15 DIAGNOSIS — M349 Systemic sclerosis, unspecified: Secondary | ICD-10-CM | POA: Diagnosis not present

## 2016-05-15 DIAGNOSIS — E1051 Type 1 diabetes mellitus with diabetic peripheral angiopathy without gangrene: Secondary | ICD-10-CM | POA: Diagnosis not present

## 2016-05-15 DIAGNOSIS — Z4781 Encounter for orthopedic aftercare following surgical amputation: Secondary | ICD-10-CM | POA: Diagnosis not present

## 2016-05-15 DIAGNOSIS — I5022 Chronic systolic (congestive) heart failure: Secondary | ICD-10-CM | POA: Diagnosis not present

## 2016-05-15 DIAGNOSIS — I110 Hypertensive heart disease with heart failure: Secondary | ICD-10-CM | POA: Diagnosis not present

## 2016-05-15 DIAGNOSIS — I2510 Atherosclerotic heart disease of native coronary artery without angina pectoris: Secondary | ICD-10-CM | POA: Diagnosis not present

## 2016-05-17 DIAGNOSIS — Z4781 Encounter for orthopedic aftercare following surgical amputation: Secondary | ICD-10-CM | POA: Diagnosis not present

## 2016-05-17 DIAGNOSIS — I5022 Chronic systolic (congestive) heart failure: Secondary | ICD-10-CM | POA: Diagnosis not present

## 2016-05-17 DIAGNOSIS — E1051 Type 1 diabetes mellitus with diabetic peripheral angiopathy without gangrene: Secondary | ICD-10-CM | POA: Diagnosis not present

## 2016-05-17 DIAGNOSIS — I2510 Atherosclerotic heart disease of native coronary artery without angina pectoris: Secondary | ICD-10-CM | POA: Diagnosis not present

## 2016-05-17 DIAGNOSIS — M349 Systemic sclerosis, unspecified: Secondary | ICD-10-CM | POA: Diagnosis not present

## 2016-05-17 DIAGNOSIS — I110 Hypertensive heart disease with heart failure: Secondary | ICD-10-CM | POA: Diagnosis not present

## 2016-05-22 DIAGNOSIS — I110 Hypertensive heart disease with heart failure: Secondary | ICD-10-CM | POA: Diagnosis not present

## 2016-05-22 DIAGNOSIS — I2510 Atherosclerotic heart disease of native coronary artery without angina pectoris: Secondary | ICD-10-CM | POA: Diagnosis not present

## 2016-05-22 DIAGNOSIS — Z4781 Encounter for orthopedic aftercare following surgical amputation: Secondary | ICD-10-CM | POA: Diagnosis not present

## 2016-05-22 DIAGNOSIS — I5022 Chronic systolic (congestive) heart failure: Secondary | ICD-10-CM | POA: Diagnosis not present

## 2016-05-22 DIAGNOSIS — M349 Systemic sclerosis, unspecified: Secondary | ICD-10-CM | POA: Diagnosis not present

## 2016-05-22 DIAGNOSIS — E1051 Type 1 diabetes mellitus with diabetic peripheral angiopathy without gangrene: Secondary | ICD-10-CM | POA: Diagnosis not present

## 2016-05-23 ENCOUNTER — Telehealth: Payer: Self-pay | Admitting: *Deleted

## 2016-05-23 NOTE — Telephone Encounter (Signed)
Spoke with patient who states [name] the home care nurse will be coming tomorrow and can draw her labs.  Left a message on [name]'s confidential voice mail. [phone] that BMET and BNP needs collected and to call back to office if a faxed order is needed and where to fax it.

## 2016-05-23 NOTE — Telephone Encounter (Signed)
-----   Message from Fay Records, MD sent at 05/02/2016  3:50 PM EDT ----- Called pt re labs  Cr is better than it has been in past  BNP is up She says she is breathing OK  She can tell fluid is coming off I would keep on same regimen I would recomm that she have BMET and BNP check on AUg 11. Call office if things worsen

## 2016-05-23 NOTE — Telephone Encounter (Signed)
Patient reports that she is taking lasix 100 mg every day, as she was prior to the previous lab work.  States Dr. Harrington Challenger instructed her to continue this dose and call if swelling or SOB worsen.

## 2016-05-24 DIAGNOSIS — M349 Systemic sclerosis, unspecified: Secondary | ICD-10-CM | POA: Diagnosis not present

## 2016-05-24 DIAGNOSIS — E1051 Type 1 diabetes mellitus with diabetic peripheral angiopathy without gangrene: Secondary | ICD-10-CM | POA: Diagnosis not present

## 2016-05-24 DIAGNOSIS — Z4781 Encounter for orthopedic aftercare following surgical amputation: Secondary | ICD-10-CM | POA: Diagnosis not present

## 2016-05-24 DIAGNOSIS — I110 Hypertensive heart disease with heart failure: Secondary | ICD-10-CM | POA: Diagnosis not present

## 2016-05-24 DIAGNOSIS — I2510 Atherosclerotic heart disease of native coronary artery without angina pectoris: Secondary | ICD-10-CM | POA: Diagnosis not present

## 2016-05-24 DIAGNOSIS — I5022 Chronic systolic (congestive) heart failure: Secondary | ICD-10-CM | POA: Diagnosis not present

## 2016-05-25 ENCOUNTER — Encounter: Payer: Self-pay | Admitting: Internal Medicine

## 2016-05-27 DIAGNOSIS — I252 Old myocardial infarction: Secondary | ICD-10-CM | POA: Diagnosis not present

## 2016-05-27 DIAGNOSIS — E1051 Type 1 diabetes mellitus with diabetic peripheral angiopathy without gangrene: Secondary | ICD-10-CM | POA: Diagnosis not present

## 2016-05-27 DIAGNOSIS — Z853 Personal history of malignant neoplasm of breast: Secondary | ICD-10-CM | POA: Diagnosis not present

## 2016-05-27 DIAGNOSIS — K219 Gastro-esophageal reflux disease without esophagitis: Secondary | ICD-10-CM | POA: Diagnosis not present

## 2016-05-27 DIAGNOSIS — E1042 Type 1 diabetes mellitus with diabetic polyneuropathy: Secondary | ICD-10-CM | POA: Diagnosis not present

## 2016-05-27 DIAGNOSIS — E039 Hypothyroidism, unspecified: Secondary | ICD-10-CM | POA: Diagnosis not present

## 2016-05-27 DIAGNOSIS — Z7982 Long term (current) use of aspirin: Secondary | ICD-10-CM | POA: Diagnosis not present

## 2016-05-27 DIAGNOSIS — Z87440 Personal history of urinary (tract) infections: Secondary | ICD-10-CM | POA: Diagnosis not present

## 2016-05-27 DIAGNOSIS — I110 Hypertensive heart disease with heart failure: Secondary | ICD-10-CM | POA: Diagnosis not present

## 2016-05-27 DIAGNOSIS — I2510 Atherosclerotic heart disease of native coronary artery without angina pectoris: Secondary | ICD-10-CM | POA: Diagnosis not present

## 2016-05-27 DIAGNOSIS — M150 Primary generalized (osteo)arthritis: Secondary | ICD-10-CM | POA: Diagnosis not present

## 2016-05-27 DIAGNOSIS — D510 Vitamin B12 deficiency anemia due to intrinsic factor deficiency: Secondary | ICD-10-CM | POA: Diagnosis not present

## 2016-05-27 DIAGNOSIS — Z89422 Acquired absence of other left toe(s): Secondary | ICD-10-CM | POA: Diagnosis not present

## 2016-05-27 DIAGNOSIS — E785 Hyperlipidemia, unspecified: Secondary | ICD-10-CM | POA: Diagnosis not present

## 2016-05-27 DIAGNOSIS — Z794 Long term (current) use of insulin: Secondary | ICD-10-CM | POA: Diagnosis not present

## 2016-05-27 DIAGNOSIS — Z951 Presence of aortocoronary bypass graft: Secondary | ICD-10-CM | POA: Diagnosis not present

## 2016-05-27 DIAGNOSIS — Z4781 Encounter for orthopedic aftercare following surgical amputation: Secondary | ICD-10-CM | POA: Diagnosis not present

## 2016-05-27 DIAGNOSIS — M349 Systemic sclerosis, unspecified: Secondary | ICD-10-CM | POA: Diagnosis not present

## 2016-05-27 DIAGNOSIS — I5022 Chronic systolic (congestive) heart failure: Secondary | ICD-10-CM | POA: Diagnosis not present

## 2016-05-28 DIAGNOSIS — E1142 Type 2 diabetes mellitus with diabetic polyneuropathy: Secondary | ICD-10-CM | POA: Diagnosis not present

## 2016-05-28 DIAGNOSIS — Z89432 Acquired absence of left foot: Secondary | ICD-10-CM | POA: Diagnosis not present

## 2016-05-28 DIAGNOSIS — L97421 Non-pressure chronic ulcer of left heel and midfoot limited to breakdown of skin: Secondary | ICD-10-CM | POA: Diagnosis not present

## 2016-05-28 DIAGNOSIS — I87323 Chronic venous hypertension (idiopathic) with inflammation of bilateral lower extremity: Secondary | ICD-10-CM | POA: Diagnosis not present

## 2016-05-30 DIAGNOSIS — I5022 Chronic systolic (congestive) heart failure: Secondary | ICD-10-CM | POA: Diagnosis not present

## 2016-05-30 DIAGNOSIS — I110 Hypertensive heart disease with heart failure: Secondary | ICD-10-CM | POA: Diagnosis not present

## 2016-05-30 DIAGNOSIS — E1051 Type 1 diabetes mellitus with diabetic peripheral angiopathy without gangrene: Secondary | ICD-10-CM | POA: Diagnosis not present

## 2016-05-30 DIAGNOSIS — I2510 Atherosclerotic heart disease of native coronary artery without angina pectoris: Secondary | ICD-10-CM | POA: Diagnosis not present

## 2016-05-30 DIAGNOSIS — M349 Systemic sclerosis, unspecified: Secondary | ICD-10-CM | POA: Diagnosis not present

## 2016-05-30 DIAGNOSIS — Z4781 Encounter for orthopedic aftercare following surgical amputation: Secondary | ICD-10-CM | POA: Diagnosis not present

## 2016-06-01 DIAGNOSIS — E1051 Type 1 diabetes mellitus with diabetic peripheral angiopathy without gangrene: Secondary | ICD-10-CM | POA: Diagnosis not present

## 2016-06-01 DIAGNOSIS — I5022 Chronic systolic (congestive) heart failure: Secondary | ICD-10-CM | POA: Diagnosis not present

## 2016-06-01 DIAGNOSIS — I2510 Atherosclerotic heart disease of native coronary artery without angina pectoris: Secondary | ICD-10-CM | POA: Diagnosis not present

## 2016-06-01 DIAGNOSIS — I110 Hypertensive heart disease with heart failure: Secondary | ICD-10-CM | POA: Diagnosis not present

## 2016-06-01 DIAGNOSIS — M349 Systemic sclerosis, unspecified: Secondary | ICD-10-CM | POA: Diagnosis not present

## 2016-06-01 DIAGNOSIS — Z4781 Encounter for orthopedic aftercare following surgical amputation: Secondary | ICD-10-CM | POA: Diagnosis not present

## 2016-06-04 ENCOUNTER — Encounter: Payer: Self-pay | Admitting: Internal Medicine

## 2016-06-04 ENCOUNTER — Ambulatory Visit (INDEPENDENT_AMBULATORY_CARE_PROVIDER_SITE_OTHER): Payer: Medicare Other | Admitting: Internal Medicine

## 2016-06-04 VITALS — BP 124/64 | HR 81 | Ht 63.0 in | Wt 159.2 lb

## 2016-06-04 DIAGNOSIS — I779 Disorder of arteries and arterioles, unspecified: Secondary | ICD-10-CM

## 2016-06-04 DIAGNOSIS — I5042 Chronic combined systolic (congestive) and diastolic (congestive) heart failure: Secondary | ICD-10-CM | POA: Diagnosis not present

## 2016-06-04 LAB — CBC
HEMATOCRIT: 39 % (ref 35.0–45.0)
Hemoglobin: 11.7 g/dL (ref 11.7–15.5)
MCH: 24.1 pg — ABNORMAL LOW (ref 27.0–33.0)
MCHC: 30 g/dL — ABNORMAL LOW (ref 32.0–36.0)
MCV: 80.2 fL (ref 80.0–100.0)
MPV: 9.2 fL (ref 7.5–12.5)
Platelets: 215 10*3/uL (ref 140–400)
RBC: 4.86 MIL/uL (ref 3.80–5.10)
RDW: 18.6 % — ABNORMAL HIGH (ref 11.0–15.0)
WBC: 5.5 10*3/uL (ref 3.8–10.8)

## 2016-06-04 LAB — BASIC METABOLIC PANEL
BUN: 32 mg/dL — ABNORMAL HIGH (ref 7–25)
CHLORIDE: 96 mmol/L — AB (ref 98–110)
CO2: 26 mmol/L (ref 20–31)
Calcium: 8.8 mg/dL (ref 8.6–10.4)
Creat: 1.57 mg/dL — ABNORMAL HIGH (ref 0.60–0.93)
Glucose, Bld: 308 mg/dL — ABNORMAL HIGH (ref 65–99)
POTASSIUM: 4.4 mmol/L (ref 3.5–5.3)
SODIUM: 132 mmol/L — AB (ref 135–146)

## 2016-06-04 NOTE — Progress Notes (Signed)
Cardiology Office Note   Date:  06/11/2016   ID:  [name], Hogate August 30, 1946, MRN [id number]  PCP:  No PCP Per Patient  Cardiologist:   Dorris Carnes, MD   No chief complaint on file.  F/U of chronic systolic CHF     History of Present Illness: [name] is a 70 y.o. female with a history of  [name] is a 70 y.o. female with a history of  CAD (s/p CABG- cath in 09/2011 showed patent LIMA to LAD with occlusion of distal LAD; High grade stenosis of nondominant RCA; Signifcant disease of prox LCx; SVG to L PDA occluded, SVG to OM patent), Aortic stenosis, DM, dyslipidemia, PVD and CKD, Echo Oct 2016 LVEF 25 to 30%, RVEF mod depressed; mod aortic stenosis. I saw the pt in May   Since May she has continued to have problems  She has had no further syncope  Event monitor showed no arrhythmia durring events while she wore device.   The patient underwent amputation earlier this summer    She says her breathing is OK She did have her leg wrapped by Dr Sharol Given  Taken off last week  Michela Pitcher that she has never seen it so small   Scared by this  Rewrapped   Has dosed lasix on own  Took 100 today   deneis CP      Outpatient Medications Prior to Visit  Medication Sig Dispense Refill  . aspirin EC 325 MG EC tablet Take 325 mg by mouth daily.     [name] atorvastatin (LIPITOR) 40 MG tablet TAKE 1 TABLET BY MOUTH AT BEDTIME 30 tablet 11  . benzonatate (TESSALON PERLES) 100 MG capsule Take 1 capsule (100 mg total) by mouth 3 (three) times daily as needed for cough. 20 capsule 0  . Cyanocobalamin 1000 MCG/ML KIT Inject 1,000 mcg into the skin every 8 (eight) weeks.     . docusate (COLACE) 50 MG/5ML liquid Take 5 mLs (50 mg total) by mouth daily as needed for mild constipation. 100 mL 0  . furosemide (LASIX) 20 MG tablet Alternate 3 pills (60 mg total) with 5 pills (100 mg total) every other day until next office visit.. (Patient taking differently: Take 100 mg by mouth daily.  Alternate 3 pills (60 mg total) with 5 pills (100 mg total) every other day until next office visit.[name]) 120 tablet 1  . hydrOXYzine (ATARAX/VISTARIL) 10 MG tablet Take 1 tablet (10 mg total) by mouth 3 (three) times daily as needed for itching or anxiety. 30 tablet 0  . insulin detemir (LEVEMIR) 100 UNIT/ML injection Inject 12-24 Units into the skin 2 (two) times daily. Inject 24 units subutaneously every morning (10am)  and 12 units at bedtime (10pm)    . insulin lispro (HUMALOG) 100 UNIT/ML injection Inject 1-12 Units into the skin 4 (four) times daily as needed for high blood sugar (sliding scale).     . isosorbide mononitrate (IMDUR) 30 MG 24 hr tablet Take 30 mg by mouth at bedtime.    [name] levothyroxine (SYNTHROID, LEVOTHROID) 100 MCG tablet Take 100 mcg by mouth daily before breakfast.   5  . silver sulfADIAZINE (SILVADENE) 1 % cream Apply to affected area on right lower extremity with compression wrap change 3 x/ week 50 g 0   No facility-administered medications prior to visit.      Allergies:   Cephalexin; Ciprofloxacin; Zyvox [linezolid]; Adhesive [tape]; Bactrim [sulfamethoxazole-trimethoprim]; Daptomycin; and Vancomycin   Past Medical History:  Diagnosis Date  . Anemia   . Anginal pain ([place])    "history; not currently" (08/28/2012)  . Aortic stenosis    0.8cm2 on echo in 10/2011  . Arthritis    "hands, hips, all over" (08/28/2012  . B12 deficiency anemia    B 12 injection "monthly since 05/2012" (08/28/2012)  . Bilateral carpal tunnel syndrome 1970s  . Blood transfusion    no reaction from transfusion; "when I had heart surgery" (08/28/2012)  . Breast cancer ([place])    left  . CAD (coronary artery disease)    s/p CABG x3 in 1993; last cath in 2010  . Cat bite 2009   with MRSA; required I & D  . Cellulitis   . Cellulitis June 2013, Nov. 2013 and Feb. 14, 2014   Left leg  . CHF (congestive heart failure) ([place]) Aug. 2012   Echo 10/2011: Mild LVH, EF 30%, apex akinetic, septal  HK, grade 2 diastolic dysfunction, or functionally bicuspid aortic valve, mild aortic stenosis by gradient, severe by calculated AVA-suspect A. Aortic stenosis probably moderate, trivial MR, mild LAE, mild RAE.   [name] Claudication ([place])   . Exertional dyspnea    "because of the heart failure" (08/28/2012)  . GERD (gastroesophageal reflux disease)   . Heart murmur   . History of bronchitis    "I've had it twice in my life" (08/28/2012)  . History of recurrent UTIs   . HTN (hypertension)   . Hyperlipidemia    on Lipitor  . Hypothyroidism   . Iron deficiency anemia 06/13/2012   "iron infusion" (08/28/2012  . Myocardial infarction [facility]) 1986   "silent"  . Osteoarthritis of both feet   . Osteomyelitis ([place])    left foot  . PVD (peripheral vascular disease) ([place])    Toes amputated from left foot and has had prior bypass on left leg. Followed by Dr. Donnetta Hutching  . Scleroderma ([place])   . Seizure ([place])    ? seizure like event in 2010; workup included stress testing which led to repeat cath.   . Stomach ulcer 1981?   "on Tagament for ~ 1 yr" in 70s  . Type 1 diabetes ([place]) 10/1949    Past Surgical History:  Procedure Laterality Date  . AMPUTATION Right 05/27/2013   Procedure: AMPUTATION DIGIT FIFTH TOE;  Surgeon: Rosetta Posner, MD;  Location: [facility] OR;  Service: Vascular;  Laterality: Right;  . AMPUTATION Left 03/21/2016   Procedure: Left Transmetatarsal Amputation;  Surgeon: Newt Minion, MD;  Location: [facility];  Service: Orthopedics;  Laterality: Left;  . APPENDECTOMY  1991  . BREAST BIOPSY    . BREAST BIOPSY WITH SENTINEL LYMPH NODE BIOPSY AND NEEDLE LOCALIZATION  1984  . CARDIAC CATHETERIZATION  2010   LIMA to LAD patent yet with occluded distal LAD after graft insertion & retrograde is occluded. 3-4 septal perforators arise &are patent. SVG to a large marginal patent; SVG presumably to the distal dominant LCX is occluded, moderately high grade stenosis of the AV circumflex, but with distal stenoses  involving a severely diseased marginal branch not amenable  to PCI  nor is inferior branch   . CARDIAC CATHETERIZATION  10/2011   "unsuccessful attempt of stenting" (08/28/2012)  . CARDIAC CATHETERIZATION  1992; 09/2011  . CARPAL TUNNEL RELEASE  ~ 1970's   bilaterally  . CATARACT EXTRACTION W/ INTRAOCULAR LENS  IMPLANT, BILATERAL  1990's  . CORONARY ARTERY BYPASS GRAFT  1992   LIMA to LAD, SVG to distal LCX &  SVG to Marginal  . FEMORAL BYPASS     left leg "related to transmetatarsal amputation" (08/28/2012)  . INCISION AND DRAINAGE OF WOUND  ~ 1967   "infection in left foot" (08/28/2012)  . INCISION AND DRAINAGE OF WOUND  2009   "3 ORs on my right hand after cat bite" (08/28/2012)  . LEFT AND RIGHT HEART CATHETERIZATION WITH CORONARY ANGIOGRAM N/A 09/17/2011   Procedure: LEFT AND RIGHT HEART CATHETERIZATION WITH CORONARY ANGIOGRAM;  Surgeon: Hillary Bow, MD;  Location: [facility] CATH LAB;  Service: Cardiovascular;  Laterality: N/A;  . LOWER EXTREMITY ANGIOGRAM N/A 05/25/2013   Procedure: LOWER EXTREMITY ANGIOGRAM POSS INTERVENTION;  Surgeon: Conrad Glenfield, MD;  Location: [facility] CATH LAB;  Service: Cardiovascular;  Laterality: N/A;  . MASTECTOMY  11/1982   Left Breast  . PERCUTANEOUS CORONARY STENT INTERVENTION (PCI-S) N/A 11/15/2011   Procedure: PERCUTANEOUS CORONARY STENT INTERVENTION (PCI-S);  Surgeon: Hillary Bow, MD;  Location[facility] CATH LAB;  Service: Cardiovascular;  Laterality: N/A;  . TONSILLECTOMY  1952   "?adenoids"   . TRANSMETATARSAL AMPUTATION  04/2009 - 10/2009   "4 ORs; left foot"  . TRIGGER FINGER RELEASE  1980's   right thumb  . TUBAL LIGATION  1984  . VITRECTOMY  1990's-2004   "both eyes; I've had total of 4"     Social History:  The patient  reports that she has never smoked. She has never used smokeless tobacco. She reports that she does not drink alcohol or use drugs.   Family History:  The patient's family history includes Diabetes in her mother; Hypertension in her  mother; Stroke in her father and mother.    ROS:  Please see the history of present illness. All other systems are reviewed and  Negative to the above problem except as noted.    PHYSICAL EXAM: VS:  BP 124/64   Pulse 81   Ht _0  (1.6 m)   Wt 159 lb 3.2 oz (72.2 kg) Comment: per pt scale at home on 8/19  SpO2 96%   BMI 28.20 kg/m   GEN: Well nourished, well developed, in no acute distress  HEENT: normal  Neck: no JVD, carotid bruits, or masses Cardiac: RRR; no murmurs, rubs, or gallops,1+ LE  edema  Respiratory:  clear to auscultation bilaterally, normal work of breathing GI: soft, nontender, nondistended, + BS  No hepatomegaly  MS: L leg wrapped with transmetatarsal amputation   Skin: warm and dry, no rash Neuro:  Strength and sensation are intact Psych: euthymic mood, full affect   EKG:  EKG is nt  ordered today.   Lipid Panel    Component Value Date/Time   CHOL 136 12/02/2015 1522   TRIG 93 12/02/2015 1522   HDL 35 (L) 12/02/2015 1522   CHOLHDL 3.9 12/02/2015 1522   VLDL 19 12/02/2015 1522   LDLCALC 82 12/02/2015 1522   LDLDIRECT 132.7 04/22/2007 0815      Wt Readings from Last 3 Encounters:  06/04/16 159 lb 3.2 oz (72.2 kg)  03/27/16 168 lb 14.4 oz (76.6 kg)  03/21/16 170 lb (77.1 kg)      ASSESSMENT AND PLAN: 1 Acute on chronic systolic CHF  VOlume status is up some on exam  Not bad  I will plan to check labs  Contact pt re lasix dosing   2  CAD  No symptoms to sugg angina  3.PVOD  S/p amputation  Has f/u with Dr Sharol Given  Leg wrapped     4 CKD  WIll recheck labs    Signed, Dorris Carnes, MD  06/11/2016 10:25 AM    [address], [place], [place]  [id number] Phone: [phone]; Fax: [phone]

## 2016-06-04 NOTE — Patient Instructions (Signed)
Your physician recommends that you continue on your current medications as directed. Please refer to the Current Medication list given to you today. Your physician recommends that you return for lab work today (CBC, BMET, BNP)

## 2016-06-05 ENCOUNTER — Telehealth: Payer: Self-pay

## 2016-06-05 ENCOUNTER — Other Ambulatory Visit: Payer: Self-pay

## 2016-06-05 DIAGNOSIS — I87323 Chronic venous hypertension (idiopathic) with inflammation of bilateral lower extremity: Secondary | ICD-10-CM | POA: Diagnosis not present

## 2016-06-05 DIAGNOSIS — E1142 Type 2 diabetes mellitus with diabetic polyneuropathy: Secondary | ICD-10-CM | POA: Diagnosis not present

## 2016-06-05 DIAGNOSIS — L97421 Non-pressure chronic ulcer of left heel and midfoot limited to breakdown of skin: Secondary | ICD-10-CM | POA: Diagnosis not present

## 2016-06-05 LAB — BRAIN NATRIURETIC PEPTIDE: Brain Natriuretic Peptide: 908.9 pg/mL — ABNORMAL HIGH (ref <100)

## 2016-06-05 NOTE — Telephone Encounter (Signed)
Pt. Called to ask if the ABI's scheduled @ 6 mo. f/u are necessary?  Noted that the last ABI's done 05/21/2014,were noted with bilateral calcified and noncompressible vessels.  Discussed with Dr. Donnetta Hutching.  Advised that the ABI's will not be necessary to repeat, due to calcified vessels.  Will cancel the ABI's with next appt. on 06/19/16.  Pt. will be notified of change in appt. time for 9/5.

## 2016-06-06 ENCOUNTER — Other Ambulatory Visit: Payer: Self-pay | Admitting: Internal Medicine

## 2016-06-08 DIAGNOSIS — I2510 Atherosclerotic heart disease of native coronary artery without angina pectoris: Secondary | ICD-10-CM | POA: Diagnosis not present

## 2016-06-08 DIAGNOSIS — M349 Systemic sclerosis, unspecified: Secondary | ICD-10-CM | POA: Diagnosis not present

## 2016-06-08 DIAGNOSIS — I5022 Chronic systolic (congestive) heart failure: Secondary | ICD-10-CM | POA: Diagnosis not present

## 2016-06-08 DIAGNOSIS — I110 Hypertensive heart disease with heart failure: Secondary | ICD-10-CM | POA: Diagnosis not present

## 2016-06-08 DIAGNOSIS — Z4781 Encounter for orthopedic aftercare following surgical amputation: Secondary | ICD-10-CM | POA: Diagnosis not present

## 2016-06-08 DIAGNOSIS — E1051 Type 1 diabetes mellitus with diabetic peripheral angiopathy without gangrene: Secondary | ICD-10-CM | POA: Diagnosis not present

## 2016-06-12 ENCOUNTER — Encounter: Payer: Self-pay | Admitting: Vascular Surgery

## 2016-06-14 DIAGNOSIS — Z4781 Encounter for orthopedic aftercare following surgical amputation: Secondary | ICD-10-CM | POA: Diagnosis not present

## 2016-06-14 DIAGNOSIS — E1051 Type 1 diabetes mellitus with diabetic peripheral angiopathy without gangrene: Secondary | ICD-10-CM | POA: Diagnosis not present

## 2016-06-14 DIAGNOSIS — M349 Systemic sclerosis, unspecified: Secondary | ICD-10-CM | POA: Diagnosis not present

## 2016-06-14 DIAGNOSIS — I2510 Atherosclerotic heart disease of native coronary artery without angina pectoris: Secondary | ICD-10-CM | POA: Diagnosis not present

## 2016-06-14 DIAGNOSIS — I5022 Chronic systolic (congestive) heart failure: Secondary | ICD-10-CM | POA: Diagnosis not present

## 2016-06-14 DIAGNOSIS — I110 Hypertensive heart disease with heart failure: Secondary | ICD-10-CM | POA: Diagnosis not present

## 2016-06-15 ENCOUNTER — Telehealth: Payer: Self-pay

## 2016-06-15 NOTE — Telephone Encounter (Signed)
Pt called to change appt from 9/15 to after 9/19. In basket sent.

## 2016-06-19 ENCOUNTER — Encounter: Payer: Self-pay | Admitting: Vascular Surgery

## 2016-06-19 ENCOUNTER — Telehealth: Payer: Self-pay

## 2016-06-19 ENCOUNTER — Encounter (HOSPITAL_COMMUNITY): Payer: Medicare Other

## 2016-06-19 ENCOUNTER — Ambulatory Visit (HOSPITAL_COMMUNITY)
Admission: RE | Admit: 2016-06-19 | Discharge: 2016-06-19 | Disposition: A | Discharge status: Home / Self Care | Payer: Medicare Other | Source: Ambulatory Visit | Attending: Vascular Surgery | Admitting: Vascular Surgery

## 2016-06-19 ENCOUNTER — Ambulatory Visit (INDEPENDENT_AMBULATORY_CARE_PROVIDER_SITE_OTHER): Payer: Medicare Other | Admitting: Vascular Surgery

## 2016-06-19 VITALS — BP 154/81 | HR 80 | Temp 97.3°F | Resp 18 | Ht 63.0 in | Wt 159.0 lb

## 2016-06-19 DIAGNOSIS — I779 Disorder of arteries and arterioles, unspecified: Secondary | ICD-10-CM

## 2016-06-19 DIAGNOSIS — I739 Peripheral vascular disease, unspecified: Secondary | ICD-10-CM

## 2016-06-19 DIAGNOSIS — I70203 Unspecified atherosclerosis of native arteries of extremities, bilateral legs: Secondary | ICD-10-CM | POA: Diagnosis not present

## 2016-06-19 DIAGNOSIS — Z95828 Presence of other vascular implants and grafts: Secondary | ICD-10-CM | POA: Diagnosis not present

## 2016-06-19 LAB — VAS US LOWER EXTREMITY ARTERIAL DUPLEX
RSFPPSV: -41 cm/s
Right peroneal sys PSV: 39 cm/s
Right popliteal dist sys PSV: -38 cm/s
Right popliteal prox sys PSV: 43 cm/s
Right super femoral dist sys PSV: -34 cm/s
Right super femoral mid sys PSV: -85 cm/s

## 2016-06-19 NOTE — Progress Notes (Signed)
Vitals:   06/19/16 1547 06/19/16 1548  BP: (!) 153/70 (!) 154/81  Pulse: 80   Resp: 18   Temp: 97.3 F (36.3 C)   TempSrc: Oral   SpO2: 97%   Weight: 159 lb (72.1 kg)   Height: 5\' 3"  (1.6 m)

## 2016-06-19 NOTE — Progress Notes (Signed)
Patient name: [name] MRN: [id number] DOB: Mar 26, 1946 Sex: female  REASON FOR VISIT: Follow-up of diffuse peripheral vascular occlusive disease  HPI: [name] is a 70 y.o. female here today for follow-up. She has had multiple prior vascular procedures. She did have a left femoral to anterior tibial bypass and transmetatarsal amputation years ago. This has been stable. She recently developed an opening in the transmetatarsal wound after years of it being healed. She saw Dr. Sharol Given and underwent revision of her transmetatarsal education. She has many chronic medical problems mainly related to severe coronary disease and severe congestive heart failure. She has a great deal difficulty with severe swelling. She has been essentially wheelchair-bound since the revision of her transmetatarsal amputation and has had significant contracture in both lower extremities related to her inactivity.  Past Medical History:  Diagnosis Date  . Anemia   . Anginal pain ([place])    "history; not currently" (08/28/2012)  . Aortic stenosis    0.8cm2 on echo in 10/2011  . Arthritis    "hands, hips, all over" (08/28/2012  . B12 deficiency anemia    B 12 injection "monthly since 05/2012" (08/28/2012)  . Bilateral carpal tunnel syndrome 1970s  . Blood transfusion    no reaction from transfusion; "when I had heart surgery" (08/28/2012)  . Breast cancer ([place])    left  . CAD (coronary artery disease)    s/p CABG x3 in 1993; last cath in 2010  . Cat bite 2009   with MRSA; required I & D  . Cellulitis   . Cellulitis June 2013, Nov. 2013 and Feb. 14, 2014   Left leg  . CHF (congestive heart failure) ([place]) Aug. 2012   Echo 10/2011: Mild LVH, EF 30%, apex akinetic, septal HK, grade 2 diastolic dysfunction, or functionally bicuspid aortic valve, mild aortic stenosis by gradient, severe by calculated AVA-suspect A. Aortic stenosis probably moderate, trivial MR, mild LAE, mild RAE.   [name] Claudication ([place])     . Exertional dyspnea    "because of the heart failure" (08/28/2012)  . GERD (gastroesophageal reflux disease)   . Heart murmur   . History of bronchitis    "I've had it twice in my life" (08/28/2012)  . History of recurrent UTIs   . HTN (hypertension)   . Hyperlipidemia    on Lipitor  . Hypothyroidism   . Iron deficiency anemia 06/13/2012   "iron infusion" (08/28/2012  . Myocardial infarction [facility]) 1986   "silent"  . Osteoarthritis of both feet   . Osteomyelitis ([place])    left foot  . PVD (peripheral vascular disease) ([place])    Toes amputated from left foot and has had prior bypass on left leg. Followed by Dr. Donnetta Hutching  . Scleroderma ([place])   . Seizure ([place])    ? seizure like event in 2010; workup included stress testing which led to repeat cath.   . Stomach ulcer 1981?   "on Tagament for ~ 1 yr" in 27s  . Type 1 diabetes ([place]) 10/1949    Family History  Problem Relation Age of Onset  . Diabetes Mother   . Stroke Mother   . Hypertension Mother   . Stroke Father   . Heart attack Neg Hx     SOCIAL HISTORY: Social History  Substance Use Topics  . Smoking status: Never Smoker  . Smokeless tobacco: Never Used  . Alcohol use No    Allergies  Allergen Reactions  . Cephalexin Swelling and Rash  No specific area  . Ciprofloxacin Swelling and Rash    No specific area  . Zyvox [Linezolid] Other (See Comments)    Thrombocytopenia developed to 50k after several weeks of therapy  . Adhesive [Tape] Other (See Comments)    "old [employer] adhesive tape; takes my skin off; can use paper tape"  . Bactrim [Sulfamethoxazole-Trimethoprim] Other (See Comments)    Hyperkalemia and Increased creatinine  . Daptomycin Other (See Comments)    Had elevated CPK on therapy, not clear that this was due to cubicin  . Vancomycin Other (See Comments)    ? If this played role in her Acute kidney injury    Current Outpatient Prescriptions  Medication Sig Dispense Refill  . aspirin EC  325 MG EC tablet Take 325 mg by mouth daily.     [name] atorvastatin (LIPITOR) 40 MG tablet TAKE 1 TABLET BY MOUTH AT BEDTIME 30 tablet 11  . Cyanocobalamin 1000 MCG/ML KIT Inject 1,000 mcg into the skin every 8 (eight) weeks.     . docusate (COLACE) 50 MG/5ML liquid Take 5 mLs (50 mg total) by mouth daily as needed for mild constipation. 100 mL 0  . furosemide (LASIX) 20 MG tablet Alternate 3 pills (60 mg total) with 5 pills (100 mg total) every other day until next office visit.. (Patient taking differently: Take 100 mg by mouth daily. Alternate 3 pills (60 mg total) with 5 pills (100 mg total) every other day until next office visit.[name]) 120 tablet 1  . hydrOXYzine (ATARAX/VISTARIL) 10 MG tablet Take 1 tablet (10 mg total) by mouth 3 (three) times daily as needed for itching or anxiety. 30 tablet 0  . insulin detemir (LEVEMIR) 100 UNIT/ML injection Inject 12-24 Units into the skin 2 (two) times daily. Inject 24 units subutaneously every morning (10am)  and 12 units at bedtime (10pm)    . insulin lispro (HUMALOG) 100 UNIT/ML injection Inject 1-12 Units into the skin 4 (four) times daily as needed for high blood sugar (sliding scale).     . isosorbide mononitrate (IMDUR) 30 MG 24 hr tablet Take 1 tablet (30 mg total) by mouth daily. 90 tablet 3  . levothyroxine (SYNTHROID, LEVOTHROID) 100 MCG tablet Take 100 mcg by mouth daily before breakfast.   5  . benzonatate (TESSALON PERLES) 100 MG capsule Take 1 capsule (100 mg total) by mouth 3 (three) times daily as needed for cough. (Patient not taking: Reported on 06/19/2016) 20 capsule 0  . isosorbide mononitrate (IMDUR) 30 MG 24 hr tablet Take 30 mg by mouth at bedtime.    . silver sulfADIAZINE (SILVADENE) 1 % cream Apply to affected area on right lower extremity with compression wrap change 3 x/ week (Patient not taking: Reported on 06/19/2016) 50 g 0   No current facility-administered medications for this visit.     REVIEW OF SYSTEMS:  '[X]'  denotes positive  finding, '[ ]'  denotes negative finding Cardiac  Comments:  Chest pain or chest pressure:    Shortness of breath upon exertion: x   Short of breath when lying flat:    Irregular heart rhythm:        Vascular    Pain in calf, thigh, or hip brought on by ambulation:    Pain in feet at night that wakes you up from your sleep:     Blood clot in your veins:    Leg swelling:  x       Pulmonary    Oxygen at home:  Productive cough:     Wheezing:         Neurologic    Sudden weakness in arms or legs:     Sudden numbness in arms or legs:     Sudden onset of difficulty speaking or slurred speech:    Temporary loss of vision in one eye:     Problems with dizziness:         Gastrointestinal    Blood in stool:     Vomited blood:         Genitourinary    Burning when urinating:     Blood in urine:        Psychiatric    Major depression:         Hematologic    Bleeding problems:    Problems with blood clotting too easily:        Skin    Rashes or ulcers:        Constitutional    Fever or chills:      PHYSICAL EXAM: Vitals:   06/19/16 1547 06/19/16 1548  BP: (!) 153/70 (!) 154/81  Pulse: 80   Resp: 18   Temp: 97.3 F (36.3 C)   TempSrc: Oral   SpO2: 97%   Weight: 159 lb (72.1 kg)   Height: '5\' 3"'  (1.6 m)     GENERAL: The patient is a well-nourished female, in no acute distress. The vital signs are documented above. VASCULAR: Easily palpable lateral left femoral to anterior tibial pulse. I do not palpate pedal pulses bilaterally PULMONARY: There is good air exchange   MUSCULOSKELETAL: There are no major deformities or cyanosis. NEUROLOGIC: No focal weakness or paresthesias are detected. SKIN: Transmetatarsal amputation revision is nearly healed. There is a small punctate area with some serous drainage PSYCHIATRIC: The patient has a normal affect.  DATA:   Invasive studies today reveal patency of her left axillary to anterior tibial bypass with no evidence of  stenosis throughout. There's been no change in her severe stenosis in her right superficial femoral artery. Severe calcification in her tibial vessels therefore ankle arm indices not obtainable  MEDICAL ISSUES:  Stable overall. Fortunately has been able to heal her transmetatarsal application revision. Will need physical therapy to be able to mobilize again since she has been wheelchair-bound being nonweightbearing. She will see [place] again in 6 months with repeat noninvasive studies. She does have marked bilateral lower extremity edema. She has had poor control of the swelling related to this with the the silver copper impregnated stocking from Dr. Sharol Given. She is supplementing this with Ace wrap and I have encouraged her to continue this. Feels critical for her to keep the swelling down as much as possible    Lucio Litsey [facility] [employer] [facility] [phone]

## 2016-06-20 DIAGNOSIS — I110 Hypertensive heart disease with heart failure: Secondary | ICD-10-CM | POA: Diagnosis not present

## 2016-06-20 DIAGNOSIS — M349 Systemic sclerosis, unspecified: Secondary | ICD-10-CM | POA: Diagnosis not present

## 2016-06-20 DIAGNOSIS — E1051 Type 1 diabetes mellitus with diabetic peripheral angiopathy without gangrene: Secondary | ICD-10-CM | POA: Diagnosis not present

## 2016-06-20 DIAGNOSIS — Z4781 Encounter for orthopedic aftercare following surgical amputation: Secondary | ICD-10-CM | POA: Diagnosis not present

## 2016-06-20 DIAGNOSIS — I5022 Chronic systolic (congestive) heart failure: Secondary | ICD-10-CM | POA: Diagnosis not present

## 2016-06-20 DIAGNOSIS — I2510 Atherosclerotic heart disease of native coronary artery without angina pectoris: Secondary | ICD-10-CM | POA: Diagnosis not present

## 2016-06-25 DIAGNOSIS — M349 Systemic sclerosis, unspecified: Secondary | ICD-10-CM | POA: Diagnosis not present

## 2016-06-25 DIAGNOSIS — I5022 Chronic systolic (congestive) heart failure: Secondary | ICD-10-CM | POA: Diagnosis not present

## 2016-06-25 DIAGNOSIS — I110 Hypertensive heart disease with heart failure: Secondary | ICD-10-CM | POA: Diagnosis not present

## 2016-06-25 DIAGNOSIS — I2510 Atherosclerotic heart disease of native coronary artery without angina pectoris: Secondary | ICD-10-CM | POA: Diagnosis not present

## 2016-06-25 DIAGNOSIS — E1051 Type 1 diabetes mellitus with diabetic peripheral angiopathy without gangrene: Secondary | ICD-10-CM | POA: Diagnosis not present

## 2016-06-25 DIAGNOSIS — Z4781 Encounter for orthopedic aftercare following surgical amputation: Secondary | ICD-10-CM | POA: Diagnosis not present

## 2016-06-27 DIAGNOSIS — Z4781 Encounter for orthopedic aftercare following surgical amputation: Secondary | ICD-10-CM | POA: Diagnosis not present

## 2016-06-27 DIAGNOSIS — I2510 Atherosclerotic heart disease of native coronary artery without angina pectoris: Secondary | ICD-10-CM | POA: Diagnosis not present

## 2016-06-27 DIAGNOSIS — I110 Hypertensive heart disease with heart failure: Secondary | ICD-10-CM | POA: Diagnosis not present

## 2016-06-27 DIAGNOSIS — M349 Systemic sclerosis, unspecified: Secondary | ICD-10-CM | POA: Diagnosis not present

## 2016-06-27 DIAGNOSIS — I5022 Chronic systolic (congestive) heart failure: Secondary | ICD-10-CM | POA: Diagnosis not present

## 2016-06-27 DIAGNOSIS — E1051 Type 1 diabetes mellitus with diabetic peripheral angiopathy without gangrene: Secondary | ICD-10-CM | POA: Diagnosis not present

## 2016-06-28 ENCOUNTER — Telehealth: Payer: Self-pay

## 2016-06-28 DIAGNOSIS — I5022 Chronic systolic (congestive) heart failure: Secondary | ICD-10-CM | POA: Diagnosis not present

## 2016-06-28 DIAGNOSIS — I2510 Atherosclerotic heart disease of native coronary artery without angina pectoris: Secondary | ICD-10-CM | POA: Diagnosis not present

## 2016-06-28 DIAGNOSIS — M349 Systemic sclerosis, unspecified: Secondary | ICD-10-CM | POA: Diagnosis not present

## 2016-06-28 DIAGNOSIS — E1051 Type 1 diabetes mellitus with diabetic peripheral angiopathy without gangrene: Secondary | ICD-10-CM | POA: Diagnosis not present

## 2016-06-28 DIAGNOSIS — I110 Hypertensive heart disease with heart failure: Secondary | ICD-10-CM | POA: Diagnosis not present

## 2016-06-28 DIAGNOSIS — Z4781 Encounter for orthopedic aftercare following surgical amputation: Secondary | ICD-10-CM | POA: Diagnosis not present

## 2016-06-28 NOTE — Telephone Encounter (Signed)
rec'd call from [facility] RN, to confirm it is okay with Dr. Donnetta Hutching for the pt. to apply Ace wrap to bilateral LE's.  Also reported the right heel is cracked with a 2.4 cm x 0.7 cm open crack, and the pt. is applying Neosporin and a band-aide to this area.  Reported the above to Dr. Donnetta Hutching; reported it is okay to use Neosporin and band-aide to cracked right heel.  Also confirmed that an Ace wrap is appropriate for management of the swelling to bilateral  lower extremities.   Called HH RN, Caitlyn; left voice message with the recommendations by Dr. Donnetta Hutching.

## 2016-06-29 ENCOUNTER — Ambulatory Visit: Payer: Medicare Other | Admitting: Hematology and Oncology

## 2016-06-29 ENCOUNTER — Other Ambulatory Visit: Payer: Medicare Other

## 2016-06-29 ENCOUNTER — Ambulatory Visit: Payer: Medicare Other

## 2016-06-29 DIAGNOSIS — M349 Systemic sclerosis, unspecified: Secondary | ICD-10-CM | POA: Diagnosis not present

## 2016-06-29 DIAGNOSIS — E1051 Type 1 diabetes mellitus with diabetic peripheral angiopathy without gangrene: Secondary | ICD-10-CM | POA: Diagnosis not present

## 2016-06-29 DIAGNOSIS — I5022 Chronic systolic (congestive) heart failure: Secondary | ICD-10-CM | POA: Diagnosis not present

## 2016-06-29 DIAGNOSIS — I2510 Atherosclerotic heart disease of native coronary artery without angina pectoris: Secondary | ICD-10-CM | POA: Diagnosis not present

## 2016-06-29 DIAGNOSIS — I110 Hypertensive heart disease with heart failure: Secondary | ICD-10-CM | POA: Diagnosis not present

## 2016-06-29 DIAGNOSIS — Z4781 Encounter for orthopedic aftercare following surgical amputation: Secondary | ICD-10-CM | POA: Diagnosis not present

## 2016-07-02 DIAGNOSIS — Z4781 Encounter for orthopedic aftercare following surgical amputation: Secondary | ICD-10-CM | POA: Diagnosis not present

## 2016-07-02 DIAGNOSIS — I5022 Chronic systolic (congestive) heart failure: Secondary | ICD-10-CM | POA: Diagnosis not present

## 2016-07-02 DIAGNOSIS — I110 Hypertensive heart disease with heart failure: Secondary | ICD-10-CM | POA: Diagnosis not present

## 2016-07-02 DIAGNOSIS — M349 Systemic sclerosis, unspecified: Secondary | ICD-10-CM | POA: Diagnosis not present

## 2016-07-02 DIAGNOSIS — I2510 Atherosclerotic heart disease of native coronary artery without angina pectoris: Secondary | ICD-10-CM | POA: Diagnosis not present

## 2016-07-02 DIAGNOSIS — E1051 Type 1 diabetes mellitus with diabetic peripheral angiopathy without gangrene: Secondary | ICD-10-CM | POA: Diagnosis not present

## 2016-07-03 DIAGNOSIS — E1051 Type 1 diabetes mellitus with diabetic peripheral angiopathy without gangrene: Secondary | ICD-10-CM | POA: Diagnosis not present

## 2016-07-03 DIAGNOSIS — M349 Systemic sclerosis, unspecified: Secondary | ICD-10-CM | POA: Diagnosis not present

## 2016-07-03 DIAGNOSIS — I5022 Chronic systolic (congestive) heart failure: Secondary | ICD-10-CM | POA: Diagnosis not present

## 2016-07-03 DIAGNOSIS — I110 Hypertensive heart disease with heart failure: Secondary | ICD-10-CM | POA: Diagnosis not present

## 2016-07-03 DIAGNOSIS — I2510 Atherosclerotic heart disease of native coronary artery without angina pectoris: Secondary | ICD-10-CM | POA: Diagnosis not present

## 2016-07-03 DIAGNOSIS — Z4781 Encounter for orthopedic aftercare following surgical amputation: Secondary | ICD-10-CM | POA: Diagnosis not present

## 2016-07-04 ENCOUNTER — Ambulatory Visit: Payer: Medicare Other | Admitting: Hematology and Oncology

## 2016-07-04 ENCOUNTER — Other Ambulatory Visit: Payer: Medicare Other

## 2016-07-04 ENCOUNTER — Ambulatory Visit: Payer: Medicare Other

## 2016-07-05 ENCOUNTER — Encounter: Payer: Self-pay | Admitting: Hematology and Oncology

## 2016-07-05 ENCOUNTER — Other Ambulatory Visit (HOSPITAL_BASED_OUTPATIENT_CLINIC_OR_DEPARTMENT_OTHER): Payer: Medicare Other

## 2016-07-05 ENCOUNTER — Ambulatory Visit (HOSPITAL_BASED_OUTPATIENT_CLINIC_OR_DEPARTMENT_OTHER): Payer: Medicare Other | Admitting: Hematology and Oncology

## 2016-07-05 ENCOUNTER — Other Ambulatory Visit: Payer: Self-pay | Admitting: Hematology and Oncology

## 2016-07-05 ENCOUNTER — Ambulatory Visit (HOSPITAL_BASED_OUTPATIENT_CLINIC_OR_DEPARTMENT_OTHER): Payer: Medicare Other

## 2016-07-05 ENCOUNTER — Telehealth: Payer: Self-pay | Admitting: Hematology and Oncology

## 2016-07-05 VITALS — BP 138/53 | HR 81 | Temp 97.9°F | Resp 18 | Ht 63.0 in | Wt 165.1 lb

## 2016-07-05 DIAGNOSIS — I110 Hypertensive heart disease with heart failure: Secondary | ICD-10-CM | POA: Diagnosis not present

## 2016-07-05 DIAGNOSIS — D518 Other vitamin B12 deficiency anemias: Secondary | ICD-10-CM

## 2016-07-05 DIAGNOSIS — I5022 Chronic systolic (congestive) heart failure: Secondary | ICD-10-CM | POA: Diagnosis not present

## 2016-07-05 DIAGNOSIS — M349 Systemic sclerosis, unspecified: Secondary | ICD-10-CM | POA: Diagnosis not present

## 2016-07-05 DIAGNOSIS — R718 Other abnormality of red blood cells: Secondary | ICD-10-CM | POA: Diagnosis not present

## 2016-07-05 DIAGNOSIS — E119 Type 2 diabetes mellitus without complications: Secondary | ICD-10-CM | POA: Diagnosis not present

## 2016-07-05 DIAGNOSIS — I739 Peripheral vascular disease, unspecified: Secondary | ICD-10-CM | POA: Diagnosis not present

## 2016-07-05 DIAGNOSIS — I2510 Atherosclerotic heart disease of native coronary artery without angina pectoris: Secondary | ICD-10-CM | POA: Diagnosis not present

## 2016-07-05 DIAGNOSIS — Z4781 Encounter for orthopedic aftercare following surgical amputation: Secondary | ICD-10-CM | POA: Diagnosis not present

## 2016-07-05 DIAGNOSIS — E1051 Type 1 diabetes mellitus with diabetic peripheral angiopathy without gangrene: Secondary | ICD-10-CM | POA: Diagnosis not present

## 2016-07-05 LAB — CBC WITH DIFFERENTIAL/PLATELET
BASO%: 0.5 % (ref 0.0–2.0)
Basophils Absolute: 0 10*3/uL (ref 0.0–0.1)
EOS ABS: 0.2 10*3/uL (ref 0.0–0.5)
EOS%: 4.4 % (ref 0.0–7.0)
HCT: 40 % (ref 34.8–46.6)
HGB: 12.3 g/dL (ref 11.6–15.9)
LYMPH%: 13 % — AB (ref 14.0–49.7)
MCH: 23.7 pg — ABNORMAL LOW (ref 25.1–34.0)
MCHC: 30.8 g/dL — ABNORMAL LOW (ref 31.5–36.0)
MCV: 77.1 fL — AB (ref 79.5–101.0)
MONO#: 0.7 10*3/uL (ref 0.1–0.9)
MONO%: 11.9 % (ref 0.0–14.0)
NEUT%: 70.2 % (ref 38.4–76.8)
NEUTROS ABS: 3.8 10*3/uL (ref 1.5–6.5)
PLATELETS: 220 10*3/uL (ref 145–400)
RBC: 5.19 10*6/uL (ref 3.70–5.45)
RDW: 19.5 % — ABNORMAL HIGH (ref 11.2–14.5)
WBC: 5.5 10*3/uL (ref 3.9–10.3)
lymph#: 0.7 10*3/uL — ABNORMAL LOW (ref 0.9–3.3)

## 2016-07-05 MED ORDER — CYANOCOBALAMIN 1000 MCG/ML IJ SOLN
1000.0000 ug | Freq: Once | INTRAMUSCULAR | Status: AC
  Administered 2016-07-05: 1000 ug via INTRAMUSCULAR

## 2016-07-05 NOTE — Assessment & Plan Note (Signed)
She is noted to have microcytosis. She had recent foot surgery. I suspect she might be lower iron deficient. We discussed iron rich diet and consideration for low-dose iron replacement therapy orally

## 2016-07-05 NOTE — Assessment & Plan Note (Signed)
She has delay when healing due to poor circulation, diabetes and peripheral vascular disease. According to the patient, her left foot infection is healing well. I would defer to wound care for further management

## 2016-07-05 NOTE — Assessment & Plan Note (Signed)
Repeat vitamin B12 level is pending. Her last serum vitamin B-12 showed adequate replacement. I recommend we continue injection every 3 months. I recommend consideration for self administration of vitamin B-12 injection but due to her vision difficulties from diabetes, the patient would prefer to come back on a regular basis for B-12 injection. I will see her back next year

## 2016-07-05 NOTE — Telephone Encounter (Signed)
Gave patient avs report and appointments for December 2017 thru September 2018.

## 2016-07-05 NOTE — Progress Notes (Signed)
[place] OFFICE PROGRESS NOTE  No PCP Per Patient SUMMARY OF HEMATOLOGIC HISTORY:  This patient was noted to have combine iron deficiency and B12 deficiency in 2013. She received 1 dose of intravenous iron feraheme on 06/13/2012. Around that same time, she was started on B12 injection on a monthly basis. Subsequently, vitamin B-12 injection was discontinued and her B-12 level dropped. Subsequently, vitamin B-12 injection was resumed and was given every other month.  Starting in of 2016, the frequency of B-12 injection was changed to every 3 months  INTERVAL HISTORY: [name] 70 y.o. female returns for follow-up. She had recent recurrent hospitalization and left foot amputation due to chronic nonhealing wound. According to the patient, it is healing well. Her appetite has changed recently. She is not eating a lot of meat but she attempted to eat some beans for protein. She is concerned about risk of infection and would like to receive pneumococcal vaccination at the end of the year. She is participating in physical rehabilitation The patient denies any recent signs or symptoms of bleeding such as spontaneous epistaxis, hematuria or hematochezia. She complained of mild pica  I have reviewed the past medical history, past surgical history, social history and family history with the patient and they are unchanged from previous note.  ALLERGIES:  is allergic to cephalexin; ciprofloxacin; zyvox [linezolid]; adhesive [tape]; bactrim [sulfamethoxazole-trimethoprim]; daptomycin; and vancomycin.  MEDICATIONS:  Current Outpatient Prescriptions  Medication Sig Dispense Refill  . aspirin EC 325 MG EC tablet Take 325 mg by mouth daily.     [name] atorvastatin (LIPITOR) 40 MG tablet TAKE 1 TABLET BY MOUTH AT BEDTIME 30 tablet 11  . Cyanocobalamin 1000 MCG/ML KIT Inject 1,000 mcg into the skin every 8 (eight) weeks.     . docusate sodium (COLACE) 50 MG capsule Take 50 mg by mouth  daily.    . furosemide (LASIX) 20 MG tablet Alternate 3 pills (60 mg total) with 5 pills (100 mg total) every other day until next office visit.. (Patient taking differently: Take 100 mg by mouth daily. Alternate 3 pills (60 mg total) with 5 pills (100 mg total) every other day until next office visit.[name]) 120 tablet 1  . insulin detemir (LEVEMIR) 100 UNIT/ML injection Inject 12-24 Units into the skin 2 (two) times daily. Inject 24 units subutaneously every morning (10am)  and 12 units at bedtime (10pm)    . insulin lispro (HUMALOG) 100 UNIT/ML injection Inject 1-12 Units into the skin 4 (four) times daily as needed for high blood sugar (sliding scale).     . isosorbide mononitrate (IMDUR) 30 MG 24 hr tablet Take 1 tablet (30 mg total) by mouth daily. 90 tablet 3  . levothyroxine (SYNTHROID, LEVOTHROID) 100 MCG tablet Take 100 mcg by mouth daily before breakfast.   5  . hydrOXYzine (ATARAX/VISTARIL) 10 MG tablet Take 1 tablet (10 mg total) by mouth 3 (three) times daily as needed for itching or anxiety. 30 tablet 0   No current facility-administered medications for this visit.      REVIEW OF SYSTEMS:   Constitutional: Denies fevers, chills or night sweats Eyes: Denies blurriness of vision Ears, nose, mouth, throat, and face: Denies mucositis or sore throat Respiratory: Denies cough, dyspnea or wheezes Cardiovascular: Denies palpitation, chest discomfort or lower extremity swelling Gastrointestinal:  Denies nausea, heartburn or change in bowel habits Skin: Denies abnormal skin rashes Lymphatics: Denies new lymphadenopathy or easy bruising Neurological:Denies numbness, tingling or new weaknesses Behavioral/Psych: Mood is stable,  no new changes  All other systems were reviewed with the patient and are negative.  PHYSICAL EXAMINATION: ECOG PERFORMANCE STATUS: 2 - Symptomatic, <50% confined to bed  Vitals:   07/05/16 1133  BP: (!) 138/53  Pulse: 81  Resp: 18  Temp: 97.9 F (36.6 C)    Filed Weights   07/05/16 1133  Weight: 165 lb 1.6 oz (74.9 kg)    GENERAL:alert, no distress and comfortable SKIN: skin color, texture, turgor are normal, no rashes or significant lesions EYES: normal, Conjunctiva are pink and non-injected, sclera clear OROPHARYNX:no exudate, no erythema and lips, buccal mucosa, and tongue normal  NECK: supple, thyroid normal size, non-tender, without nodularity LYMPH:  no palpable lymphadenopathy in the cervical, axillary or inguinal LUNGS: clear to auscultation and percussion with normal breathing effort HEART: regular rate & rhythm and no murmurs and no lower extremity edema ABDOMEN:abdomen soft, non-tender and normal bowel sounds Musculoskeletal:no cyanosis of digits and no clubbing  NEURO: alert & oriented x 3 with fluent speech, no focal motor/sensory deficits  LABORATORY DATA:  I have reviewed the data as listed     Component Value Date/Time   NA 132 (L) 06/04/2016 1429   NA 137 08/07/2012 1101   K 4.4 06/04/2016 1429   K 4.1 08/07/2012 1101   CL 96 (L) 06/04/2016 1429   CL 102 08/07/2012 1101   CO2 26 06/04/2016 1429   CO2 25 08/07/2012 1101   GLUCOSE 308 (H) 06/04/2016 1429   GLUCOSE 320 (H) 08/07/2012 1101   BUN 32 (H) 06/04/2016 1429   BUN 25.0 08/07/2012 1101   CREATININE 1.57 (H) 06/04/2016 1429   CREATININE 1.0 08/07/2012 1101   CALCIUM 8.8 06/04/2016 1429   CALCIUM 9.8 08/07/2012 1101   PROT 6.7 03/23/2016 1704   ALBUMIN 3.0 (L) 03/23/2016 1704   AST 18 03/23/2016 1704   ALT 12 (L) 03/23/2016 1704   ALKPHOS 95 03/23/2016 1704   BILITOT 0.9 03/23/2016 1704   GFRNONAA 27 (L) 03/25/2016 0552   GFRNONAA 45 (L) 11/17/2013 1635   GFRAA 31 (L) 03/25/2016 0552   GFRAA 52 (L) 11/17/2013 1635    No results found for: SPEP, UPEP  Lab Results  Component Value Date   WBC 5.5 07/05/2016   NEUTROABS 3.8 07/05/2016   HGB 12.3 07/05/2016   HCT 40.0 07/05/2016   MCV 77.1 (L) 07/05/2016   PLT 220 07/05/2016      Chemistry       Component Value Date/Time   NA 132 (L) 06/04/2016 1429   NA 137 08/07/2012 1101   K 4.4 06/04/2016 1429   K 4.1 08/07/2012 1101   CL 96 (L) 06/04/2016 1429   CL 102 08/07/2012 1101   CO2 26 06/04/2016 1429   CO2 25 08/07/2012 1101   BUN 32 (H) 06/04/2016 1429   BUN 25.0 08/07/2012 1101   CREATININE 1.57 (H) 06/04/2016 1429   CREATININE 1.0 08/07/2012 1101      Component Value Date/Time   CALCIUM 8.8 06/04/2016 1429   CALCIUM 9.8 08/07/2012 1101   ALKPHOS 95 03/23/2016 1704   AST 18 03/23/2016 1704   ALT 12 (L) 03/23/2016 1704   BILITOT 0.9 03/23/2016 1704      ASSESSMENT & PLAN:  Vitamin B12 deficiency (dietary) anemia Repeat vitamin B12 level is pending. Her last serum vitamin B-12 showed adequate replacement. I recommend we continue injection every 3 months. I recommend consideration for self administration of vitamin B-12 injection but due to her vision difficulties from  diabetes, the patient would prefer to come back on a regular basis for B-12 injection. I will see her back next year  Microcytosis She is noted to have microcytosis. She had recent foot surgery. I suspect she might be lower iron deficient. We discussed iron rich diet and consideration for low-dose iron replacement therapy orally  PAD (peripheral artery disease) ([place]) She has delay when healing due to poor circulation, diabetes and peripheral vascular disease. According to the patient, her left foot infection is healing well. I would defer to wound care for further management   All questions were answered. The patient knows to call the clinic with any problems, questions or concerns. No barriers to learning was detected.  I spent 15 minutes counseling the patient face to face. The total time spent in the appointment was 20 minutes and more than 50% was on counseling.     [facility], Jaelen Gellerman, MD 9/21/201712:41 PM

## 2016-07-06 ENCOUNTER — Telehealth: Payer: Self-pay | Admitting: *Deleted

## 2016-07-06 DIAGNOSIS — I2510 Atherosclerotic heart disease of native coronary artery without angina pectoris: Secondary | ICD-10-CM | POA: Diagnosis not present

## 2016-07-06 DIAGNOSIS — E1051 Type 1 diabetes mellitus with diabetic peripheral angiopathy without gangrene: Secondary | ICD-10-CM | POA: Diagnosis not present

## 2016-07-06 DIAGNOSIS — M349 Systemic sclerosis, unspecified: Secondary | ICD-10-CM | POA: Diagnosis not present

## 2016-07-06 DIAGNOSIS — I5022 Chronic systolic (congestive) heart failure: Secondary | ICD-10-CM | POA: Diagnosis not present

## 2016-07-06 DIAGNOSIS — I110 Hypertensive heart disease with heart failure: Secondary | ICD-10-CM | POA: Diagnosis not present

## 2016-07-06 DIAGNOSIS — Z4781 Encounter for orthopedic aftercare following surgical amputation: Secondary | ICD-10-CM | POA: Diagnosis not present

## 2016-07-06 LAB — VITAMIN B12: VITAMIN B 12: 500 pg/mL (ref 211–946)

## 2016-07-06 NOTE — Telephone Encounter (Signed)
LM with note below 

## 2016-07-06 NOTE — Telephone Encounter (Signed)
-----   Message from Heath Lark, MD sent at 07/06/2016  7:45 AM EDT ----- Regarding: B12 level ok Ple let her know B12 level ok No change in plan ----- Message ----- From: Interface, Lab In Three Zero One Sent: 07/05/2016  11:33 AM To: Heath Lark, MD

## 2016-07-10 DIAGNOSIS — I87323 Chronic venous hypertension (idiopathic) with inflammation of bilateral lower extremity: Secondary | ICD-10-CM | POA: Diagnosis not present

## 2016-07-10 DIAGNOSIS — I5022 Chronic systolic (congestive) heart failure: Secondary | ICD-10-CM | POA: Diagnosis not present

## 2016-07-10 DIAGNOSIS — Z4781 Encounter for orthopedic aftercare following surgical amputation: Secondary | ICD-10-CM | POA: Diagnosis not present

## 2016-07-10 DIAGNOSIS — E1142 Type 2 diabetes mellitus with diabetic polyneuropathy: Secondary | ICD-10-CM | POA: Diagnosis not present

## 2016-07-10 DIAGNOSIS — E1051 Type 1 diabetes mellitus with diabetic peripheral angiopathy without gangrene: Secondary | ICD-10-CM | POA: Diagnosis not present

## 2016-07-10 DIAGNOSIS — I110 Hypertensive heart disease with heart failure: Secondary | ICD-10-CM | POA: Diagnosis not present

## 2016-07-10 DIAGNOSIS — I2510 Atherosclerotic heart disease of native coronary artery without angina pectoris: Secondary | ICD-10-CM | POA: Diagnosis not present

## 2016-07-10 DIAGNOSIS — M349 Systemic sclerosis, unspecified: Secondary | ICD-10-CM | POA: Diagnosis not present

## 2016-07-10 DIAGNOSIS — L97411 Non-pressure chronic ulcer of right heel and midfoot limited to breakdown of skin: Secondary | ICD-10-CM | POA: Diagnosis not present

## 2016-07-10 DIAGNOSIS — Z89432 Acquired absence of left foot: Secondary | ICD-10-CM | POA: Diagnosis not present

## 2016-07-11 DIAGNOSIS — I2510 Atherosclerotic heart disease of native coronary artery without angina pectoris: Secondary | ICD-10-CM | POA: Diagnosis not present

## 2016-07-11 DIAGNOSIS — I110 Hypertensive heart disease with heart failure: Secondary | ICD-10-CM | POA: Diagnosis not present

## 2016-07-11 DIAGNOSIS — Z4781 Encounter for orthopedic aftercare following surgical amputation: Secondary | ICD-10-CM | POA: Diagnosis not present

## 2016-07-11 DIAGNOSIS — M349 Systemic sclerosis, unspecified: Secondary | ICD-10-CM | POA: Diagnosis not present

## 2016-07-11 DIAGNOSIS — E1051 Type 1 diabetes mellitus with diabetic peripheral angiopathy without gangrene: Secondary | ICD-10-CM | POA: Diagnosis not present

## 2016-07-11 DIAGNOSIS — I5022 Chronic systolic (congestive) heart failure: Secondary | ICD-10-CM | POA: Diagnosis not present

## 2016-07-12 DIAGNOSIS — I2510 Atherosclerotic heart disease of native coronary artery without angina pectoris: Secondary | ICD-10-CM | POA: Diagnosis not present

## 2016-07-12 DIAGNOSIS — I110 Hypertensive heart disease with heart failure: Secondary | ICD-10-CM | POA: Diagnosis not present

## 2016-07-12 DIAGNOSIS — E1051 Type 1 diabetes mellitus with diabetic peripheral angiopathy without gangrene: Secondary | ICD-10-CM | POA: Diagnosis not present

## 2016-07-12 DIAGNOSIS — I5022 Chronic systolic (congestive) heart failure: Secondary | ICD-10-CM | POA: Diagnosis not present

## 2016-07-12 DIAGNOSIS — M349 Systemic sclerosis, unspecified: Secondary | ICD-10-CM | POA: Diagnosis not present

## 2016-07-12 DIAGNOSIS — Z4781 Encounter for orthopedic aftercare following surgical amputation: Secondary | ICD-10-CM | POA: Diagnosis not present

## 2016-07-17 DIAGNOSIS — E1051 Type 1 diabetes mellitus with diabetic peripheral angiopathy without gangrene: Secondary | ICD-10-CM | POA: Diagnosis not present

## 2016-07-17 DIAGNOSIS — I110 Hypertensive heart disease with heart failure: Secondary | ICD-10-CM | POA: Diagnosis not present

## 2016-07-17 DIAGNOSIS — I5022 Chronic systolic (congestive) heart failure: Secondary | ICD-10-CM | POA: Diagnosis not present

## 2016-07-17 DIAGNOSIS — M349 Systemic sclerosis, unspecified: Secondary | ICD-10-CM | POA: Diagnosis not present

## 2016-07-17 DIAGNOSIS — I2510 Atherosclerotic heart disease of native coronary artery without angina pectoris: Secondary | ICD-10-CM | POA: Diagnosis not present

## 2016-07-17 DIAGNOSIS — Z4781 Encounter for orthopedic aftercare following surgical amputation: Secondary | ICD-10-CM | POA: Diagnosis not present

## 2016-07-19 DIAGNOSIS — I2510 Atherosclerotic heart disease of native coronary artery without angina pectoris: Secondary | ICD-10-CM | POA: Diagnosis not present

## 2016-07-19 DIAGNOSIS — E1051 Type 1 diabetes mellitus with diabetic peripheral angiopathy without gangrene: Secondary | ICD-10-CM | POA: Diagnosis not present

## 2016-07-19 DIAGNOSIS — Z4781 Encounter for orthopedic aftercare following surgical amputation: Secondary | ICD-10-CM | POA: Diagnosis not present

## 2016-07-19 DIAGNOSIS — I110 Hypertensive heart disease with heart failure: Secondary | ICD-10-CM | POA: Diagnosis not present

## 2016-07-19 DIAGNOSIS — I5022 Chronic systolic (congestive) heart failure: Secondary | ICD-10-CM | POA: Diagnosis not present

## 2016-07-19 DIAGNOSIS — M349 Systemic sclerosis, unspecified: Secondary | ICD-10-CM | POA: Diagnosis not present

## 2016-07-20 DIAGNOSIS — Z4781 Encounter for orthopedic aftercare following surgical amputation: Secondary | ICD-10-CM | POA: Diagnosis not present

## 2016-07-20 DIAGNOSIS — M349 Systemic sclerosis, unspecified: Secondary | ICD-10-CM | POA: Diagnosis not present

## 2016-07-20 DIAGNOSIS — I5022 Chronic systolic (congestive) heart failure: Secondary | ICD-10-CM | POA: Diagnosis not present

## 2016-07-20 DIAGNOSIS — I110 Hypertensive heart disease with heart failure: Secondary | ICD-10-CM | POA: Diagnosis not present

## 2016-07-20 DIAGNOSIS — E1051 Type 1 diabetes mellitus with diabetic peripheral angiopathy without gangrene: Secondary | ICD-10-CM | POA: Diagnosis not present

## 2016-07-20 DIAGNOSIS — I2510 Atherosclerotic heart disease of native coronary artery without angina pectoris: Secondary | ICD-10-CM | POA: Diagnosis not present

## 2016-07-23 ENCOUNTER — Encounter: Payer: Self-pay | Admitting: Vascular Surgery

## 2016-07-23 DIAGNOSIS — I2510 Atherosclerotic heart disease of native coronary artery without angina pectoris: Secondary | ICD-10-CM | POA: Diagnosis not present

## 2016-07-23 DIAGNOSIS — M349 Systemic sclerosis, unspecified: Secondary | ICD-10-CM | POA: Diagnosis not present

## 2016-07-23 DIAGNOSIS — I110 Hypertensive heart disease with heart failure: Secondary | ICD-10-CM | POA: Diagnosis not present

## 2016-07-23 DIAGNOSIS — E1051 Type 1 diabetes mellitus with diabetic peripheral angiopathy without gangrene: Secondary | ICD-10-CM | POA: Diagnosis not present

## 2016-07-23 DIAGNOSIS — I5022 Chronic systolic (congestive) heart failure: Secondary | ICD-10-CM | POA: Diagnosis not present

## 2016-07-23 DIAGNOSIS — Z4781 Encounter for orthopedic aftercare following surgical amputation: Secondary | ICD-10-CM | POA: Diagnosis not present

## 2016-07-25 DIAGNOSIS — Z4781 Encounter for orthopedic aftercare following surgical amputation: Secondary | ICD-10-CM | POA: Diagnosis not present

## 2016-07-25 DIAGNOSIS — I2510 Atherosclerotic heart disease of native coronary artery without angina pectoris: Secondary | ICD-10-CM | POA: Diagnosis not present

## 2016-07-25 DIAGNOSIS — E1051 Type 1 diabetes mellitus with diabetic peripheral angiopathy without gangrene: Secondary | ICD-10-CM | POA: Diagnosis not present

## 2016-07-25 DIAGNOSIS — I5022 Chronic systolic (congestive) heart failure: Secondary | ICD-10-CM | POA: Diagnosis not present

## 2016-07-25 DIAGNOSIS — I110 Hypertensive heart disease with heart failure: Secondary | ICD-10-CM | POA: Diagnosis not present

## 2016-07-25 DIAGNOSIS — M349 Systemic sclerosis, unspecified: Secondary | ICD-10-CM | POA: Diagnosis not present

## 2016-07-26 DIAGNOSIS — Z951 Presence of aortocoronary bypass graft: Secondary | ICD-10-CM | POA: Diagnosis not present

## 2016-07-26 DIAGNOSIS — D510 Vitamin B12 deficiency anemia due to intrinsic factor deficiency: Secondary | ICD-10-CM | POA: Diagnosis not present

## 2016-07-26 DIAGNOSIS — E785 Hyperlipidemia, unspecified: Secondary | ICD-10-CM | POA: Diagnosis not present

## 2016-07-26 DIAGNOSIS — Z4781 Encounter for orthopedic aftercare following surgical amputation: Secondary | ICD-10-CM | POA: Diagnosis not present

## 2016-07-26 DIAGNOSIS — Z87440 Personal history of urinary (tract) infections: Secondary | ICD-10-CM | POA: Diagnosis not present

## 2016-07-26 DIAGNOSIS — K219 Gastro-esophageal reflux disease without esophagitis: Secondary | ICD-10-CM | POA: Diagnosis not present

## 2016-07-26 DIAGNOSIS — I5022 Chronic systolic (congestive) heart failure: Secondary | ICD-10-CM | POA: Diagnosis not present

## 2016-07-26 DIAGNOSIS — Z794 Long term (current) use of insulin: Secondary | ICD-10-CM | POA: Diagnosis not present

## 2016-07-26 DIAGNOSIS — Z853 Personal history of malignant neoplasm of breast: Secondary | ICD-10-CM | POA: Diagnosis not present

## 2016-07-26 DIAGNOSIS — Z89422 Acquired absence of other left toe(s): Secondary | ICD-10-CM | POA: Diagnosis not present

## 2016-07-26 DIAGNOSIS — I2510 Atherosclerotic heart disease of native coronary artery without angina pectoris: Secondary | ICD-10-CM | POA: Diagnosis not present

## 2016-07-26 DIAGNOSIS — Z7982 Long term (current) use of aspirin: Secondary | ICD-10-CM | POA: Diagnosis not present

## 2016-07-26 DIAGNOSIS — I110 Hypertensive heart disease with heart failure: Secondary | ICD-10-CM | POA: Diagnosis not present

## 2016-07-26 DIAGNOSIS — I252 Old myocardial infarction: Secondary | ICD-10-CM | POA: Diagnosis not present

## 2016-07-26 DIAGNOSIS — E1042 Type 1 diabetes mellitus with diabetic polyneuropathy: Secondary | ICD-10-CM | POA: Diagnosis not present

## 2016-07-26 DIAGNOSIS — E039 Hypothyroidism, unspecified: Secondary | ICD-10-CM | POA: Diagnosis not present

## 2016-07-26 DIAGNOSIS — M150 Primary generalized (osteo)arthritis: Secondary | ICD-10-CM | POA: Diagnosis not present

## 2016-07-26 DIAGNOSIS — M349 Systemic sclerosis, unspecified: Secondary | ICD-10-CM | POA: Diagnosis not present

## 2016-07-26 DIAGNOSIS — E1051 Type 1 diabetes mellitus with diabetic peripheral angiopathy without gangrene: Secondary | ICD-10-CM | POA: Diagnosis not present

## 2016-07-31 DIAGNOSIS — I2510 Atherosclerotic heart disease of native coronary artery without angina pectoris: Secondary | ICD-10-CM | POA: Diagnosis not present

## 2016-07-31 DIAGNOSIS — M349 Systemic sclerosis, unspecified: Secondary | ICD-10-CM | POA: Diagnosis not present

## 2016-07-31 DIAGNOSIS — Z4781 Encounter for orthopedic aftercare following surgical amputation: Secondary | ICD-10-CM | POA: Diagnosis not present

## 2016-07-31 DIAGNOSIS — E1051 Type 1 diabetes mellitus with diabetic peripheral angiopathy without gangrene: Secondary | ICD-10-CM | POA: Diagnosis not present

## 2016-07-31 DIAGNOSIS — I110 Hypertensive heart disease with heart failure: Secondary | ICD-10-CM | POA: Diagnosis not present

## 2016-07-31 DIAGNOSIS — I5022 Chronic systolic (congestive) heart failure: Secondary | ICD-10-CM | POA: Diagnosis not present

## 2016-08-02 DIAGNOSIS — M349 Systemic sclerosis, unspecified: Secondary | ICD-10-CM | POA: Diagnosis not present

## 2016-08-02 DIAGNOSIS — I5022 Chronic systolic (congestive) heart failure: Secondary | ICD-10-CM | POA: Diagnosis not present

## 2016-08-02 DIAGNOSIS — I2510 Atherosclerotic heart disease of native coronary artery without angina pectoris: Secondary | ICD-10-CM | POA: Diagnosis not present

## 2016-08-02 DIAGNOSIS — I110 Hypertensive heart disease with heart failure: Secondary | ICD-10-CM | POA: Diagnosis not present

## 2016-08-02 DIAGNOSIS — E1051 Type 1 diabetes mellitus with diabetic peripheral angiopathy without gangrene: Secondary | ICD-10-CM | POA: Diagnosis not present

## 2016-08-02 DIAGNOSIS — Z4781 Encounter for orthopedic aftercare following surgical amputation: Secondary | ICD-10-CM | POA: Diagnosis not present

## 2016-08-03 ENCOUNTER — Telehealth: Payer: Self-pay | Admitting: Internal Medicine

## 2016-08-03 DIAGNOSIS — Z4781 Encounter for orthopedic aftercare following surgical amputation: Secondary | ICD-10-CM | POA: Diagnosis not present

## 2016-08-03 DIAGNOSIS — I5022 Chronic systolic (congestive) heart failure: Secondary | ICD-10-CM | POA: Diagnosis not present

## 2016-08-03 DIAGNOSIS — I2510 Atherosclerotic heart disease of native coronary artery without angina pectoris: Secondary | ICD-10-CM | POA: Diagnosis not present

## 2016-08-03 DIAGNOSIS — M349 Systemic sclerosis, unspecified: Secondary | ICD-10-CM | POA: Diagnosis not present

## 2016-08-03 DIAGNOSIS — I110 Hypertensive heart disease with heart failure: Secondary | ICD-10-CM | POA: Diagnosis not present

## 2016-08-03 DIAGNOSIS — E1051 Type 1 diabetes mellitus with diabetic peripheral angiopathy without gangrene: Secondary | ICD-10-CM | POA: Diagnosis not present

## 2016-08-03 NOTE — Telephone Encounter (Signed)
Pt contacted She isnt feeling that bad  Does not want to go to the hospital I have recomm she get CBC, BMET, BNP drawn  Discussed with [facility]   Made appt for her to come in on Mon at 9:15

## 2016-08-03 NOTE — Telephone Encounter (Signed)
New message     Nurse request to talk to a nurse now regarding pt oxygen sats have been dropping all day even with rest.

## 2016-08-03 NOTE — Telephone Encounter (Signed)
Called Home health nurse.  Was told that the patients O2 sat was at 85% initially.  She did some deep breathing and raised it to 95%.  The nurse walked her and the O2 dropped to 76%.  With rest and deep breaths she returned to 95%.  Vitals were 100/60  Hr 76  resp 18 temo 96.3. Gave info to Dr Harrington Challenger for review.

## 2016-08-04 DIAGNOSIS — I2510 Atherosclerotic heart disease of native coronary artery without angina pectoris: Secondary | ICD-10-CM | POA: Diagnosis not present

## 2016-08-04 DIAGNOSIS — M349 Systemic sclerosis, unspecified: Secondary | ICD-10-CM | POA: Diagnosis not present

## 2016-08-04 DIAGNOSIS — Z4781 Encounter for orthopedic aftercare following surgical amputation: Secondary | ICD-10-CM | POA: Diagnosis not present

## 2016-08-04 DIAGNOSIS — E1051 Type 1 diabetes mellitus with diabetic peripheral angiopathy without gangrene: Secondary | ICD-10-CM | POA: Diagnosis not present

## 2016-08-04 DIAGNOSIS — I110 Hypertensive heart disease with heart failure: Secondary | ICD-10-CM | POA: Diagnosis not present

## 2016-08-04 DIAGNOSIS — I5022 Chronic systolic (congestive) heart failure: Secondary | ICD-10-CM | POA: Diagnosis not present

## 2016-08-06 ENCOUNTER — Ambulatory Visit (INDEPENDENT_AMBULATORY_CARE_PROVIDER_SITE_OTHER): Payer: Medicare Other | Admitting: Internal Medicine

## 2016-08-06 ENCOUNTER — Encounter: Payer: Self-pay | Admitting: Internal Medicine

## 2016-08-06 VITALS — BP 128/68 | HR 77 | Ht 63.0 in | Wt 163.8 lb

## 2016-08-06 DIAGNOSIS — I5042 Chronic combined systolic (congestive) and diastolic (congestive) heart failure: Secondary | ICD-10-CM | POA: Diagnosis not present

## 2016-08-06 DIAGNOSIS — E1051 Type 1 diabetes mellitus with diabetic peripheral angiopathy without gangrene: Secondary | ICD-10-CM | POA: Diagnosis not present

## 2016-08-06 DIAGNOSIS — I739 Peripheral vascular disease, unspecified: Secondary | ICD-10-CM | POA: Diagnosis not present

## 2016-08-06 DIAGNOSIS — I110 Hypertensive heart disease with heart failure: Secondary | ICD-10-CM | POA: Diagnosis not present

## 2016-08-06 DIAGNOSIS — D518 Other vitamin B12 deficiency anemias: Secondary | ICD-10-CM | POA: Diagnosis not present

## 2016-08-06 DIAGNOSIS — I2510 Atherosclerotic heart disease of native coronary artery without angina pectoris: Secondary | ICD-10-CM | POA: Diagnosis not present

## 2016-08-06 DIAGNOSIS — Z4781 Encounter for orthopedic aftercare following surgical amputation: Secondary | ICD-10-CM | POA: Diagnosis not present

## 2016-08-06 DIAGNOSIS — I5022 Chronic systolic (congestive) heart failure: Secondary | ICD-10-CM | POA: Diagnosis not present

## 2016-08-06 DIAGNOSIS — M349 Systemic sclerosis, unspecified: Secondary | ICD-10-CM | POA: Diagnosis not present

## 2016-08-06 DIAGNOSIS — I779 Disorder of arteries and arterioles, unspecified: Secondary | ICD-10-CM

## 2016-08-06 LAB — IRON: IRON: 21 ug/dL — AB (ref 45–160)

## 2016-08-06 MED ORDER — TORSEMIDE 20 MG PO TABS: ORAL_TABLET | ORAL | 0 refills | Status: DC

## 2016-08-06 NOTE — Progress Notes (Signed)
 Cardiology Office Note   Date:  08/06/2016   ID:  [name], DOB 02/04/1946, MRN [id number]  PCP:  No PCP Per Patient  Cardiologist:   Paula Ross, MD    F/U of systolic CHF    History of Present Illness: [name] is a 70 y.o. female with a history of CAD (s/p CABG- cath in 09/2011 showed patent LIMA to LAD with occlusion of distal LAD; High grade stenosis of nondominant RCA; Signifcant disease of prox LCx; SVG to L PDA occluded, SVG to OM patent), Aortic stenosis, DM, dyslipidemia, PVD and CKD, Echo Oct 2016 LVEF 25 to 30%, RVEF mod depressed; mod aortic stenosis. I saw the pt in May  Home health worker called on Friday  Pt was going through exercises with walking  First time O2 was checked  After exertion was 79% on RA  Quickly recovered to mid 90s at rest The pt says she does get winded with activity  At rest OK  Wakes up occasionally with SOB        Outpatient Medications Prior to Visit  Medication Sig Dispense Refill  . aspirin EC 325 MG EC tablet Take 325 mg by mouth daily.     . atorvastatin (LIPITOR) 40 MG tablet TAKE 1 TABLET BY MOUTH AT BEDTIME 30 tablet 11  . Cyanocobalamin 1000 MCG/ML KIT Inject 1,000 mcg into the skin every 8 (eight) weeks.     . docusate sodium (COLACE) 50 MG capsule Take 50 mg by mouth daily.    . furosemide (LASIX) 20 MG tablet Alternate 3 pills (60 mg total) with 5 pills (100 mg total) every other day until next office visit.. (Patient taking differently: Take 100 mg by mouth daily. Alternate 3 pills (60 mg total) with 5 pills (100 mg total) every other day until next office visit..) 120 tablet 1  . hydrOXYzine (ATARAX/VISTARIL) 10 MG tablet Take 1 tablet (10 mg total) by mouth 3 (three) times daily as needed for itching or anxiety. 30 tablet 0  . insulin detemir (LEVEMIR) 100 UNIT/ML injection Inject 12-24 Units into the skin 2 (two) times daily. Inject 24 units subutaneously every morning (10am)  and 12 units at bedtime  (10pm)    . insulin lispro (HUMALOG) 100 UNIT/ML injection Inject 1-12 Units into the skin 4 (four) times daily as needed for high blood sugar (sliding scale).     . isosorbide mononitrate (IMDUR) 30 MG 24 hr tablet Take 1 tablet (30 mg total) by mouth daily. 90 tablet 3  . levothyroxine (SYNTHROID, LEVOTHROID) 100 MCG tablet Take 100 mcg by mouth daily before breakfast.   5   No facility-administered medications prior to visit.      Allergies:   Cephalexin; Ciprofloxacin; Zyvox [linezolid]; Adhesive [tape]; Bactrim [sulfamethoxazole-trimethoprim]; Daptomycin; and Vancomycin   Past Medical History:  Diagnosis Date  . Anemia   . Anginal pain (HCC)    "history; not currently" (08/28/2012)  . Aortic stenosis    0.8cm2 on echo in 10/2011  . Arthritis    "hands, hips, all over" (08/28/2012  . B12 deficiency anemia    B 12 injection "monthly since 05/2012" (08/28/2012)  . Bilateral carpal tunnel syndrome 1970s  . Blood transfusion    no reaction from transfusion; "when I had heart surgery" (08/28/2012)  . Breast cancer (HCC)    left  . CAD (coronary artery disease)    s/p CABG x3 in 1993; last cath in 2010  . Cat bite 2009     with MRSA; required I & D  . Cellulitis   . Cellulitis June 2013, Nov. 2013 and Feb. 14, 2014   Left leg  . CHF (congestive heart failure) ([place]) Aug. 2012   Echo 10/2011: Mild LVH, EF 30%, apex akinetic, septal HK, grade 2 diastolic dysfunction, or functionally bicuspid aortic valve, mild aortic stenosis by gradient, severe by calculated AVA-suspect A. Aortic stenosis probably moderate, trivial MR, mild LAE, mild RAE.   [name] Claudication ([place])   . Exertional dyspnea    "because of the heart failure" (08/28/2012)  . GERD (gastroesophageal reflux disease)   . Heart murmur   . History of bronchitis    "I've had it twice in my life" (08/28/2012)  . History of recurrent UTIs   . HTN (hypertension)   . Hyperlipidemia    on Lipitor  . Hypothyroidism   . Iron  deficiency anemia 06/13/2012   "iron infusion" (08/28/2012  . Myocardial infarction 1986   "silent"  . Osteoarthritis of both feet   . Osteomyelitis ([place])    left foot  . PVD (peripheral vascular disease) ([place])    Toes amputated from left foot and has had prior bypass on left leg. Followed by Dr. Donnetta Hutching  . Scleroderma ([place])   . Seizure ([place])    ? seizure like event in 2010; workup included stress testing which led to repeat cath.   . Stomach ulcer 1981?   "on Tagament for ~ 1 yr" in 47s  . Type 1 diabetes ([place]) 10/1949    Past Surgical History:  Procedure Laterality Date  . AMPUTATION Right 05/27/2013   Procedure: AMPUTATION DIGIT FIFTH TOE;  Surgeon: Rosetta Posner, MD;  Location[facility];  Service: Vascular;  Laterality: Right;  . AMPUTATION Left 03/21/2016   Procedure: Left Transmetatarsal Amputation;  Surgeon: Newt Minion, MD;  Location: [place];  Service: Orthopedics;  Laterality: Left;  . APPENDECTOMY  1991  . BREAST BIOPSY    . BREAST BIOPSY WITH SENTINEL LYMPH NODE BIOPSY AND NEEDLE LOCALIZATION  1984  . CARDIAC CATHETERIZATION  2010   LIMA to LAD patent yet with occluded distal LAD after graft insertion & retrograde is occluded. 3-4 septal perforators arise &are patent. SVG to a large marginal patent; SVG presumably to the distal dominant LCX is occluded, moderately high grade stenosis of the AV circumflex, but with distal stenoses involving a severely diseased marginal branch not amenable  to PCI  nor is inferior branch   . CARDIAC CATHETERIZATION  10/2011   "unsuccessful attempt of stenting" (08/28/2012)  . CARDIAC CATHETERIZATION  1992; 09/2011  . CARPAL TUNNEL RELEASE  ~ 1970's   bilaterally  . CATARACT EXTRACTION W/ INTRAOCULAR LENS  IMPLANT, BILATERAL  1990's  . CORONARY ARTERY BYPASS GRAFT  1992   LIMA to LAD, SVG to distal LCX & SVG to Marginal  . FEMORAL BYPASS     left leg "related to transmetatarsal amputation" (08/28/2012)  . INCISION AND DRAINAGE OF WOUND  ~ 1967    "infection in left foot" (08/28/2012)  . INCISION AND DRAINAGE OF WOUND  2009   "3 ORs on my right hand after cat bite" (08/28/2012)  . LEFT AND RIGHT HEART CATHETERIZATION WITH CORONARY ANGIOGRAM N/A 09/17/2011   Procedure: LEFT AND RIGHT HEART CATHETERIZATION WITH CORONARY ANGIOGRAM;  Surgeon: Hillary Bow, MD;  Location: [facility] CATH LAB;  Service: Cardiovascular;  Laterality: N/A;  . LOWER EXTREMITY ANGIOGRAM N/A 05/25/2013   Procedure: LOWER EXTREMITY ANGIOGRAM POSS INTERVENTION;  Surgeon: Jannette Fogo  Chen, MD;  Location: [facility] CATH LAB;  Service: Cardiovascular;  Laterality: N/A;  . MASTECTOMY  11/1982   Left Breast  . PERCUTANEOUS CORONARY STENT INTERVENTION (PCI-S) N/A 11/15/2011   Procedure: PERCUTANEOUS CORONARY STENT INTERVENTION (PCI-S);  Surgeon: Thomas D Stuckey, MD;  Location: [facility] CATH LAB;  Service: Cardiovascular;  Laterality: N/A;  . TONSILLECTOMY  1952   "?adenoids"   . TRANSMETATARSAL AMPUTATION  04/2009 - 10/2009   "4 ORs; left foot"  . TRIGGER FINGER RELEASE  1980's   right thumb  . TUBAL LIGATION  1984  . VITRECTOMY  1990's-2004   "both eyes; I've had total of 4"     Social History:  The patient  reports that she has never smoked. She has never used smokeless tobacco. She reports that she does not drink alcohol or use drugs.   Family History:  The patient's family history includes Diabetes in her mother; Hypertension in her mother; Stroke in her father and mother.    ROS:  Please see the history of present illness. All other systems are reviewed and  Negative to the above problem except as noted.    PHYSICAL EXAM: VS:  BP 128/68   Pulse 77   Ht 5' 3" (1.6 m)   Wt 163 lb 12.8 oz (74.3 kg)   SpO2 93%   BMI 29.02 kg/m   GEN: Well nourished, well developed, in no acute distress  HEENT: normal  Neck: JVP is increased  , carotid bruits, or masses Cardiac: RRR; no murmurs, rubs, or gallops,1+ edema   Legs wapped   Respiratory:  clear to auscultation bilaterally, normal  work of breathing GI: soft, nontender, nondistended, + BS  No hepatomegaly  Skin: warm and dry, Erythema on legs   Neuro:  Strength and sensation are intact Psych: euthymic mood, full affect   EKG:  EKG is ordered today.  SR 77 bpm  Septal MI  Low voltage   Lipid Panel    Component Value Date/Time   CHOL 136 12/02/2015 1522   TRIG 93 12/02/2015 1522   HDL 35 (L) 12/02/2015 1522   CHOLHDL 3.9 12/02/2015 1522   VLDL 19 12/02/2015 1522   LDLCALC 82 12/02/2015 1522   LDLDIRECT 132.7 04/22/2007 0815      Wt Readings from Last 3 Encounters:  08/06/16 163 lb 12.8 oz (74.3 kg)  07/05/16 165 lb 1.6 oz (74.9 kg)  06/19/16 159 lb (72.1 kg)      ASSESSMENT AND PLAN:  `  Acute on chronic systolic CHF  Labs from Sat Cr 1.65  BNP 1000   Volume is increased on exam though she has had more in past.   I would recomm switching to torsemided 40 mg e day  Will call wiith response  Take once daily now.  Oxygen does drop  She is getting a pulse ox   Follow  She does not have O2 at home  Reluctant   She will call later in week with readings  Also review response to torsemide  2  CAD  I am not convinced of any active angina  3.  Microcytosis.  MCV down   I would check Fe and ferritin level    4  HL  WIll need to be followed   F/U in 2 wks  She will call as well    Current medicines are reviewed at length with the patient today.  The patient does not have concerns regarding medicines.  Signed, Paula Ross, MD  08/06/2016   10:03 AM    [facility] [address], [place], [place]  [address] Phone: [phone]; Fax: [phone]    

## 2016-08-06 NOTE — Telephone Encounter (Signed)
I called and spoke with [name] at [facility].  She wanted to make sure labs were received via fax this morning on the patient as her BNP was 1000. Spoke with Desiree Lucy, RN as she was working with Dr. Harrington Challenger this morning. Per Webb Silversmith, Dr. Harrington Challenger did see labs- these results are documented in her notes.

## 2016-08-06 NOTE — Patient Instructions (Addendum)
Medication Instructions:  Stop lasix (furosemide).  Start torsemide (demadex) 40mg  daily. This will be 2 of a 20mg  tablet daily in the morning.  Labwork: Iron and Ferritin today.  Testing/Procedures: none  Follow-Up: Your physician recommends that you schedule a follow-up appointment in: 6 weeks with Dr Harrington Challenger.       If you need a refill on your cardiac medications before your next appointment, please call your pharmacy.

## 2016-08-06 NOTE — Telephone Encounter (Signed)
Follow Up:     She needs to give you the patient's lab results.

## 2016-08-07 ENCOUNTER — Ambulatory Visit (INDEPENDENT_AMBULATORY_CARE_PROVIDER_SITE_OTHER): Payer: Medicare Other | Admitting: Orthopedic Surgery

## 2016-08-07 LAB — FERRITIN: FERRITIN: 17 ng/mL — AB (ref 20–288)

## 2016-08-08 ENCOUNTER — Telehealth: Payer: Self-pay | Admitting: *Deleted

## 2016-08-08 DIAGNOSIS — I110 Hypertensive heart disease with heart failure: Secondary | ICD-10-CM | POA: Diagnosis not present

## 2016-08-08 DIAGNOSIS — M349 Systemic sclerosis, unspecified: Secondary | ICD-10-CM | POA: Diagnosis not present

## 2016-08-08 DIAGNOSIS — I5022 Chronic systolic (congestive) heart failure: Secondary | ICD-10-CM | POA: Diagnosis not present

## 2016-08-08 DIAGNOSIS — E1051 Type 1 diabetes mellitus with diabetic peripheral angiopathy without gangrene: Secondary | ICD-10-CM | POA: Diagnosis not present

## 2016-08-08 DIAGNOSIS — Z4781 Encounter for orthopedic aftercare following surgical amputation: Secondary | ICD-10-CM | POA: Diagnosis not present

## 2016-08-08 DIAGNOSIS — I2510 Atherosclerotic heart disease of native coronary artery without angina pectoris: Secondary | ICD-10-CM | POA: Diagnosis not present

## 2016-08-08 NOTE — Telephone Encounter (Signed)
LVM for pt to return nurse's call regarding lab work.  Received Low iron results from Dr. Harrington Challenger.   Message from from Dr. Alvy Bimler;  "Her doctor checked iron studies and they are low  Would the patient be interested to come back for IV iron treatment?  Simplest way would be to try oral iron first but if she cannot tolerate we can prescribe IV iron  Please let me know her preference and I can place return order "

## 2016-08-09 ENCOUNTER — Telehealth: Payer: Self-pay | Admitting: Internal Medicine

## 2016-08-09 NOTE — Telephone Encounter (Signed)
Tried to return call to [facility], physical therapy assistant with [place].  I did not leave a message.  I called the patient and left her a voice mail to call back to let us know if she has heard from or followed up with Dr. Alvy Bimler.  Her iron levels are low and need to be treated.  This could be the reason for her decreased O2 sats.

## 2016-08-09 NOTE — Telephone Encounter (Signed)
New Message  [name] voiced wanted to let us know the Oxygen at rest was at 89%, she is out of oxygen stayed at low 90's.  Had hard time getting up to 94% and maintaining.

## 2016-08-10 DIAGNOSIS — I110 Hypertensive heart disease with heart failure: Secondary | ICD-10-CM | POA: Diagnosis not present

## 2016-08-10 DIAGNOSIS — E1051 Type 1 diabetes mellitus with diabetic peripheral angiopathy without gangrene: Secondary | ICD-10-CM | POA: Diagnosis not present

## 2016-08-10 DIAGNOSIS — I2510 Atherosclerotic heart disease of native coronary artery without angina pectoris: Secondary | ICD-10-CM | POA: Diagnosis not present

## 2016-08-10 DIAGNOSIS — Z4781 Encounter for orthopedic aftercare following surgical amputation: Secondary | ICD-10-CM | POA: Diagnosis not present

## 2016-08-10 DIAGNOSIS — I5022 Chronic systolic (congestive) heart failure: Secondary | ICD-10-CM | POA: Diagnosis not present

## 2016-08-10 DIAGNOSIS — M349 Systemic sclerosis, unspecified: Secondary | ICD-10-CM | POA: Diagnosis not present

## 2016-08-10 NOTE — Telephone Encounter (Signed)
Call from patient who wanted to provide update. The torsemide is working well.  She has had increased urination and swelling is still present but improving.  Her breathing is better this week, compared to last week.  Her sats continue to drop to high 80s with exertion and improve to at least 94% when she rests and uses pursed lip breathing.  She is expecting a call from Dr. Calton Dach office to discuss treatment for her iron deficiency.

## 2016-08-12 NOTE — Telephone Encounter (Signed)
Set up for BMET next week

## 2016-08-13 ENCOUNTER — Telehealth: Payer: Self-pay | Admitting: *Deleted

## 2016-08-13 NOTE — Telephone Encounter (Signed)
Informed pt of Dr. Calton Dach message.  Pt says she has not been on any oral iron recently but she seems to remember it did not help much in the past.  She prefers to have Iron Infusion because she is hoping it will work quicker and be more effective than oral iron.  Pt c/o SOB and feels the low iron increases her symptoms of SOB and Fatigue.  She has been trying to recover from surgery and hopes some improvement in iron stores will help her energy level.

## 2016-08-13 NOTE — Telephone Encounter (Signed)
-----   Message from Heath Lark, MD sent at 08/07/2016  2:36 PM EDT ----- Her doctor checked iron studies and they are low Would the patient be interested to come back for IV iron treatment? Simplest way would be to try oral iron first but if she cannot tolerate we can prescribe IV iron Please let me know her preference and I can place return order ----- Message ----- From: Fay Records, MD Sent: 08/07/2016   2:09 PM To: Heath Lark, MD, Rodman Key, RN  Iron numbers are low I will forward to Dr Alvy Bimler to get recommendations

## 2016-08-14 ENCOUNTER — Other Ambulatory Visit: Payer: Self-pay | Admitting: Hematology and Oncology

## 2016-08-14 DIAGNOSIS — M349 Systemic sclerosis, unspecified: Secondary | ICD-10-CM | POA: Diagnosis not present

## 2016-08-14 DIAGNOSIS — Z4781 Encounter for orthopedic aftercare following surgical amputation: Secondary | ICD-10-CM | POA: Diagnosis not present

## 2016-08-14 DIAGNOSIS — I110 Hypertensive heart disease with heart failure: Secondary | ICD-10-CM | POA: Diagnosis not present

## 2016-08-14 DIAGNOSIS — E1051 Type 1 diabetes mellitus with diabetic peripheral angiopathy without gangrene: Secondary | ICD-10-CM | POA: Diagnosis not present

## 2016-08-14 DIAGNOSIS — I5022 Chronic systolic (congestive) heart failure: Secondary | ICD-10-CM | POA: Diagnosis not present

## 2016-08-14 DIAGNOSIS — D509 Iron deficiency anemia, unspecified: Secondary | ICD-10-CM

## 2016-08-14 DIAGNOSIS — I2510 Atherosclerotic heart disease of native coronary artery without angina pectoris: Secondary | ICD-10-CM | POA: Diagnosis not present

## 2016-08-14 NOTE — Telephone Encounter (Signed)
I placed orders and scheduling message 11/9: labs, see me at 1015 am, 15 mins followed by IV iron and another dose of IV iron on 11/16

## 2016-08-14 NOTE — Telephone Encounter (Signed)
Informed pt of Dr. Calton Dach message and to expect call from [place] to get her scheduled for 11/9 and 11/6. Pt verbalized understanding.

## 2016-08-15 ENCOUNTER — Encounter: Payer: Self-pay | Admitting: *Deleted

## 2016-08-15 DIAGNOSIS — M349 Systemic sclerosis, unspecified: Secondary | ICD-10-CM | POA: Diagnosis not present

## 2016-08-15 DIAGNOSIS — I2510 Atherosclerotic heart disease of native coronary artery without angina pectoris: Secondary | ICD-10-CM | POA: Diagnosis not present

## 2016-08-15 DIAGNOSIS — E1051 Type 1 diabetes mellitus with diabetic peripheral angiopathy without gangrene: Secondary | ICD-10-CM | POA: Diagnosis not present

## 2016-08-15 DIAGNOSIS — I110 Hypertensive heart disease with heart failure: Secondary | ICD-10-CM | POA: Diagnosis not present

## 2016-08-15 DIAGNOSIS — Z4781 Encounter for orthopedic aftercare following surgical amputation: Secondary | ICD-10-CM | POA: Diagnosis not present

## 2016-08-15 DIAGNOSIS — I5022 Chronic systolic (congestive) heart failure: Secondary | ICD-10-CM | POA: Diagnosis not present

## 2016-08-15 NOTE — Telephone Encounter (Signed)
Sent patient a MyChart message

## 2016-08-16 DIAGNOSIS — Z4781 Encounter for orthopedic aftercare following surgical amputation: Secondary | ICD-10-CM | POA: Diagnosis not present

## 2016-08-16 DIAGNOSIS — I2510 Atherosclerotic heart disease of native coronary artery without angina pectoris: Secondary | ICD-10-CM | POA: Diagnosis not present

## 2016-08-16 DIAGNOSIS — M349 Systemic sclerosis, unspecified: Secondary | ICD-10-CM | POA: Diagnosis not present

## 2016-08-16 DIAGNOSIS — I110 Hypertensive heart disease with heart failure: Secondary | ICD-10-CM | POA: Diagnosis not present

## 2016-08-16 DIAGNOSIS — I5022 Chronic systolic (congestive) heart failure: Secondary | ICD-10-CM | POA: Diagnosis not present

## 2016-08-16 DIAGNOSIS — E1051 Type 1 diabetes mellitus with diabetic peripheral angiopathy without gangrene: Secondary | ICD-10-CM | POA: Diagnosis not present

## 2016-08-17 ENCOUNTER — Telehealth: Payer: Self-pay | Admitting: Internal Medicine

## 2016-08-17 NOTE — Telephone Encounter (Signed)
Follow up      Pt states she received a call from Dr Harrington Challenger regarding her breathing.  She want her to know that she is fine.

## 2016-08-17 NOTE — Telephone Encounter (Signed)
LM TO CALL BACK ./CY 

## 2016-08-17 NOTE — Telephone Encounter (Signed)
SPOKE  WITH BETH  RE PT.  PT  HAD  PHYSICAL THERAPY  YESTERDAY   RESTING  O 2 SAT WAS  89%  DID  A LOT  OF   STANDING  AND  WALKING  THERAPY  O2  RANGED  FROM 92-95  AND  AT THE END  OF  SESSION  DROPPED  TO 76%  IMMEDIATELY  REBOUNDED  TO PT'S  NORM AFTER  2 MINUTES   HAS  ALSO  HAD  AT LEAST   A  2  LB  WEIGHT  LOSS SINCE  MED  CHANGED  TO  TORSEMIDE  WILL FORWARD TO  DR  ROSS  FOR  RECOMMENDATIONS .Adonis Housekeeper

## 2016-08-17 NOTE — Telephone Encounter (Signed)
New Message  [name] voiced yesterday during session oxygen they were at 89%, really able to get up to 82% throughout session. Dropped as low as 72% but improved in less than a minute or two. She's going in for iron infusion next thursday to see humatologist or cancer Md.  Please f/u if need be

## 2016-08-20 ENCOUNTER — Other Ambulatory Visit: Payer: Self-pay | Admitting: *Deleted

## 2016-08-20 ENCOUNTER — Telehealth: Payer: Self-pay | Admitting: Hematology and Oncology

## 2016-08-20 DIAGNOSIS — I5042 Chronic combined systolic (congestive) and diastolic (congestive) heart failure: Secondary | ICD-10-CM

## 2016-08-20 NOTE — Telephone Encounter (Signed)
lvm to inform pt of 11/9 appt date/time per LOS

## 2016-08-22 ENCOUNTER — Encounter (INDEPENDENT_AMBULATORY_CARE_PROVIDER_SITE_OTHER): Payer: Self-pay | Admitting: Orthopedic Surgery

## 2016-08-22 ENCOUNTER — Other Ambulatory Visit: Payer: Self-pay | Admitting: Hematology and Oncology

## 2016-08-22 ENCOUNTER — Ambulatory Visit (INDEPENDENT_AMBULATORY_CARE_PROVIDER_SITE_OTHER): Payer: Medicare Other | Admitting: Orthopedic Surgery

## 2016-08-22 VITALS — Ht 63.0 in | Wt 163.0 lb

## 2016-08-22 DIAGNOSIS — L97409 Non-pressure chronic ulcer of unspecified heel and midfoot with unspecified severity: Secondary | ICD-10-CM

## 2016-08-22 DIAGNOSIS — E10621 Type 1 diabetes mellitus with foot ulcer: Secondary | ICD-10-CM

## 2016-08-22 DIAGNOSIS — N183 Chronic kidney disease, stage 3 unspecified: Secondary | ICD-10-CM

## 2016-08-22 DIAGNOSIS — R269 Unspecified abnormalities of gait and mobility: Secondary | ICD-10-CM

## 2016-08-22 DIAGNOSIS — Z89432 Acquired absence of left foot: Secondary | ICD-10-CM

## 2016-08-22 DIAGNOSIS — L97421 Non-pressure chronic ulcer of left heel and midfoot limited to breakdown of skin: Secondary | ICD-10-CM

## 2016-08-22 DIAGNOSIS — L97411 Non-pressure chronic ulcer of right heel and midfoot limited to breakdown of skin: Secondary | ICD-10-CM

## 2016-08-22 NOTE — Progress Notes (Signed)
Office Visit Note   Patient: [name]           Date of Birth: June 21, 1946           MRN: [id number] Visit Date: 08/22/2016              Requested by: No referring provider defined for this encounter. PCP: No PCP Per Patient   Assessment & Plan: Visit Diagnoses:  1. Status post transmetatarsal amputation of left foot ([place])   2. 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 Abnormality of gait   4. 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 Non-pressure chronic ulcer of right heel and midfoot limited to breakdown of skin ([place])     Plan: Continue daily wound care with nonadherent dressings. Will follow up with Dr. early for wound care. Would Like to follow up with [place] as needed.  Follow-Up Instructions: Return if symptoms worsen or fail to improve.   Orders:  No orders of the defined types were placed in this encounter.  No orders of the defined types were placed in this encounter.     Procedures: No procedures performed   Clinical Data: No additional findings.   Subjective: Chief Complaint  Patient presents with  . Left Foot - Follow-up    Left transmetatarsal amputation 03/21/16  . Right Foot - Follow-up    History of heel ulcer    Patient is 5 months s/p transmet amputation and follow up for a right heel ulcer.   Is currently in physical therapy for gait training. States that her wounds are getting better. Has been having home health do dressing changes 3 times a week with a friend assisting on other days. Declined debridement of wounds today.  Review of Systems  Constitutional: Negative for chills and fever.  Musculoskeletal: Positive for gait problem.  All other systems reviewed and are negative.    Objective: Vital Signs: Ht 5\' 3"  (1.6 m)   Wt 163 lb (73.9 kg)   BMI 28.87 kg/m   Physical Exam Physical Exam  Constitutional: He is oriented to person, place, and time. He  appears well-developed and well-nourished.  Pulmonary/Chest: Effort normal.  Neurological: He is alert and oriented to person, place, and time.  Psychiatric: He has a normal mood and affect.  Nursing note reviewed.   Ortho Exam  Ambulatory in a wheelchair today. Does have a left medial plantar heel ulcer that measures 3 mm in diameter. This is filled in with fibrinous exudative tissue. No drainage erythema or sign of infection. The left foot transmetatarsal amputation is well-healed. The right foot also has a heel ulcer that measures 2 mm in diameter and is also filled in with exudative tissue. There is no drainage, surrounding erythema or sign of infection.  Specialty Comments:  No specialty comments available.  Imaging: No results found.   PMFS History: Patient Active Problem List   Diagnosis Date Noted  . Microcytosis 07/05/2016  . PAD (peripheral artery disease) ([place]) 03/23/2016  . Status post transmetatarsal amputation of left foot ([place]) 03/21/2016  . Pain in limb 03/02/2014  . Skin tear 11/17/2013  . PVD (peripheral vascular disease) ([place]) 09/22/2013  . Aftercare following surgery of the circulatory system, [place] 06/09/2013  . Atherosclerosis of native arteries of the extremities with ulceration(440.23) 06/09/2013  . Diabetic osteomyelitis ([place]) 06/02/2013  . 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  Abnormality of gait 01/21/2013  . CKD (chronic kidney disease), stage III 11/29/2012  . Chronic combined systolic and diastolic heart failure ([place]) 04/24/2012  . Health care maintenance 04/24/2012  . Iron deficiency anemia 04/15/2012  . Vitamin B12 deficiency (dietary) anemia 04/15/2012  . Aortic stenosis 08/22/2011  . ACQUIRED HEMOLYTIC ANEMIA UNSPECIFIED 06/06/2009  . CAD 02/03/2009  . CAROTID ARTERY STENOSIS, WITHOUT INFARCTION 02/03/2009  . Peripheral artery disease 02/03/2009  . Unspecified hypothyroidism 02/01/2009  . DYSLIPIDEMIA  02/01/2009  . Essential hypertension 02/01/2009  . DM (diabetes mellitus) type I uncontrolled with renal manifestation ([place]) 10/15/1949   Past Medical History:  Diagnosis Date  . Anemia   . Anginal pain ([place])    "history; not currently" (08/28/2012)  . Aortic stenosis    0.8cm2 on echo in 10/2011  . Arthritis    "hands, hips, all over" (08/28/2012  . B12 deficiency anemia    B 12 injection "monthly since 05/2012" (08/28/2012)  . Bilateral carpal tunnel syndrome 1970s  . Blood transfusion    no reaction from transfusion; "when I had heart surgery" (08/28/2012)  . Breast cancer ([place])    left  . CAD (coronary artery disease)    s/p CABG x3 in 1993; last cath in 2010  . Cat bite 2009   with MRSA; required I & D  . Cellulitis   . Cellulitis June 2013, Nov. 2013 and Feb. 14, 2014   Left leg  . CHF (congestive heart failure) ([place]) Aug. 2012   Echo 10/2011: Mild LVH, EF 30%, apex akinetic, septal HK, grade 2 diastolic dysfunction, or functionally bicuspid aortic valve, mild aortic stenosis by gradient, severe by calculated AVA-suspect A. Aortic stenosis probably moderate, trivial MR, mild LAE, mild RAE.   [name] Claudication ([place])   . Exertional dyspnea    "because of the heart failure" (08/28/2012)  . GERD (gastroesophageal reflux disease)   . Heart murmur   . History of bronchitis    "I've had it twice in my life" (08/28/2012)  . History of recurrent UTIs   . HTN (hypertension)   . Hyperlipidemia    on Lipitor  . Hypothyroidism   . Iron deficiency anemia 06/13/2012   "iron infusion" (08/28/2012  . Myocardial infarction 1986   "silent"  . Osteoarthritis of both feet   . Osteomyelitis ([place])    left foot  . PVD (peripheral vascular disease) ([place])    Toes amputated from left foot and has had prior bypass on left leg. Followed by Dr. Donnetta Hutching  . Scleroderma ([place])   . Seizure ([place])    ? seizure like event in 2010; workup included stress testing which led to repeat cath.   . Stomach ulcer  1981?   "on Tagament for ~ 1 yr" in 77s  . Type 1 diabetes ([place]) 10/1949    Family History  Problem Relation Age of Onset  . Diabetes Mother   . Stroke Mother   . Hypertension Mother   . Stroke Father   . Heart attack Neg Hx     Past Surgical History:  Procedure Laterality Date  . AMPUTATION Right 05/27/2013   Procedure: AMPUTATION DIGIT FIFTH TOE;  Surgeon: Rosetta Posner, MD;  Location: [facility] OR;  Service: Vascular;  Laterality: Right;  . AMPUTATION Left 03/21/2016   Procedure: Left Transmetatarsal Amputation;  Surgeon: Newt Minion, MD;  Location: [place];  Service: Orthopedics;  Laterality: Left;  . APPENDECTOMY  1991  . BREAST BIOPSY    . BREAST BIOPSY WITH  SENTINEL LYMPH NODE BIOPSY AND NEEDLE LOCALIZATION  1984  . CARDIAC CATHETERIZATION  2010   LIMA to LAD patent yet with occluded distal LAD after graft insertion & retrograde is occluded. 3-4 septal perforators arise &are patent. SVG to a large marginal patent; SVG presumably to the distal dominant LCX is occluded, moderately high grade stenosis of the AV circumflex, but with distal stenoses involving a severely diseased marginal branch not amenable  to PCI  nor is inferior branch   . CARDIAC CATHETERIZATION  10/2011   "unsuccessful attempt of stenting" (08/28/2012)  . CARDIAC CATHETERIZATION  1992; 09/2011  . CARPAL TUNNEL RELEASE  ~ 1970's   bilaterally  . CATARACT EXTRACTION W/ INTRAOCULAR LENS  IMPLANT, BILATERAL  1990's  . CORONARY ARTERY BYPASS GRAFT  1992   LIMA to LAD, SVG to distal LCX & SVG to Marginal  . FEMORAL BYPASS     left leg "related to transmetatarsal amputation" (08/28/2012)  . INCISION AND DRAINAGE OF WOUND  ~ 1967   "infection in left foot" (08/28/2012)  . INCISION AND DRAINAGE OF WOUND  2009   "3 ORs on my right hand after cat bite" (08/28/2012)  . LEFT AND RIGHT HEART CATHETERIZATION WITH CORONARY ANGIOGRAM N/A 09/17/2011   Procedure: LEFT AND RIGHT HEART CATHETERIZATION WITH CORONARY ANGIOGRAM;  Surgeon:  Hillary Bow, MD;  Location: [facility] CATH LAB;  Service: Cardiovascular;  Laterality: N/A;  . LOWER EXTREMITY ANGIOGRAM N/A 05/25/2013   Procedure: LOWER EXTREMITY ANGIOGRAM POSS INTERVENTION;  Surgeon: Conrad Owyhee, MD;  Location: [facility] CATH LAB;  Service: Cardiovascular;  Laterality: N/A;  . MASTECTOMY  11/1982   Left Breast  . PERCUTANEOUS CORONARY STENT INTERVENTION (PCI-S) N/A 11/15/2011   Procedure: PERCUTANEOUS CORONARY STENT INTERVENTION (PCI-S);  Surgeon: Hillary Bow, MD;  Location: [facility] CATH LAB;  Service: Cardiovascular;  Laterality: N/A;  . TONSILLECTOMY  1952   "?adenoids"   . TRANSMETATARSAL AMPUTATION  04/2009 - 10/2009   "4 ORs; left foot"  . TRIGGER FINGER RELEASE  1980's   right thumb  . TUBAL LIGATION  1984  . VITRECTOMY  1990's-2004   "both eyes; I've had total of 4"   Social History   Occupational History  . Web designer    Social History Main Topics  . Smoking status: Never Smoker  . Smokeless tobacco: Never Used  . Alcohol use No  . Drug use: No  . Sexual activity: Not Currently    Birth control/ protection: Post-menopausal

## 2016-08-23 ENCOUNTER — Other Ambulatory Visit (HOSPITAL_BASED_OUTPATIENT_CLINIC_OR_DEPARTMENT_OTHER): Payer: Medicare Other

## 2016-08-23 ENCOUNTER — Ambulatory Visit (HOSPITAL_BASED_OUTPATIENT_CLINIC_OR_DEPARTMENT_OTHER): Payer: Medicare Other | Admitting: Hematology and Oncology

## 2016-08-23 ENCOUNTER — Encounter: Payer: Self-pay | Admitting: Hematology and Oncology

## 2016-08-23 ENCOUNTER — Ambulatory Visit (HOSPITAL_BASED_OUTPATIENT_CLINIC_OR_DEPARTMENT_OTHER): Payer: Medicare Other

## 2016-08-23 VITALS — BP 139/50 | HR 73

## 2016-08-23 DIAGNOSIS — D518 Other vitamin B12 deficiency anemias: Secondary | ICD-10-CM

## 2016-08-23 DIAGNOSIS — D509 Iron deficiency anemia, unspecified: Secondary | ICD-10-CM | POA: Diagnosis not present

## 2016-08-23 DIAGNOSIS — N183 Chronic kidney disease, stage 3 unspecified: Secondary | ICD-10-CM

## 2016-08-23 LAB — CBC & DIFF AND RETIC
BASO%: 0.8 % (ref 0.0–2.0)
Basophils Absolute: 0 10*3/uL (ref 0.0–0.1)
EOS%: 3.7 % (ref 0.0–7.0)
Eosinophils Absolute: 0.2 10*3/uL (ref 0.0–0.5)
HCT: 39.1 % (ref 34.8–46.6)
HGB: 11.9 g/dL (ref 11.6–15.9)
Immature Retic Fract: 11.3 % — ABNORMAL HIGH (ref 1.60–10.00)
LYMPH%: 16.7 % (ref 14.0–49.7)
MCH: 23.1 pg — AB (ref 25.1–34.0)
MCHC: 30.4 g/dL — AB (ref 31.5–36.0)
MCV: 75.9 fL — AB (ref 79.5–101.0)
MONO#: 0.5 10*3/uL (ref 0.1–0.9)
MONO%: 10.1 % (ref 0.0–14.0)
NEUT#: 3.6 10*3/uL (ref 1.5–6.5)
NEUT%: 68.7 % (ref 38.4–76.8)
PLATELETS: 199 10*3/uL (ref 145–400)
RBC: 5.15 10*6/uL (ref 3.70–5.45)
RDW: 19.1 % — ABNORMAL HIGH (ref 11.2–14.5)
Retic %: 1.09 % (ref 0.70–2.10)
Retic Ct Abs: 56.14 10*3/uL (ref 33.70–90.70)
WBC: 5.2 10*3/uL (ref 3.9–10.3)
lymph#: 0.9 10*3/uL (ref 0.9–3.3)

## 2016-08-23 LAB — BASIC METABOLIC PANEL
ANION GAP: 10 meq/L (ref 3–11)
BUN: 43.1 mg/dL — AB (ref 7.0–26.0)
CALCIUM: 9.8 mg/dL (ref 8.4–10.4)
CO2: 26 mEq/L (ref 22–29)
CREATININE: 1.8 mg/dL — AB (ref 0.6–1.1)
Chloride: 101 mEq/L (ref 98–109)
EGFR: 29 mL/min/{1.73_m2} — ABNORMAL LOW (ref >90)
Glucose: 244 mg/dl — ABNORMAL HIGH (ref 70–140)
Potassium: 5.4 mEq/L — ABNORMAL HIGH (ref 3.5–5.1)
Sodium: 137 mEq/L (ref 136–145)

## 2016-08-23 MED ORDER — SODIUM CHLORIDE 0.9 % IV SOLN
Freq: Once | INTRAVENOUS | Status: AC
  Administered 2016-08-23: 16:00:00 via INTRAVENOUS

## 2016-08-23 MED ORDER — SODIUM CHLORIDE 0.9 % IV SOLN
510.0000 mg | Freq: Once | INTRAVENOUS | Status: AC
  Administered 2016-08-23: 510 mg via INTRAVENOUS
  Filled 2016-08-23: qty 17

## 2016-08-23 NOTE — Assessment & Plan Note (Signed)
Her last serum vitamin B-12 showed adequate replacement. I recommend we continue injection every 3 months. I recommend consideration for self administration of vitamin B-12 injection but due to her vision difficulties from diabetes, the patient would prefer to come back on a regular basis for B-12 injection. I will see her back next year

## 2016-08-23 NOTE — Patient Instructions (Signed)
Ferritin Test WHY AM I HAVING THIS TEST? The ferritin test is performed to determine if you have anemia due to iron deficiency. The test provides an indication of how much iron you have stored in your body. Decreases in serum ferritin levels often come before other signs of iron deficiency. WHAT KIND OF SAMPLE IS TAKEN? A blood sample is required for this test. It is usually collected by inserting a needle into a vein. HOW DO I PREPARE FOR THE TEST? There is no preparation or fasting required for this test. WHAT ARE THE REFERENCE RANGES? Reference ranges are considered healthy ranges established after testing a large group of healthy people. Reference ranges may vary among different people, labs, and hospitals. It is your responsibility to obtain your test results. Ask the lab or department performing the test when and how you will get your results. The reference ranges for the ferritin test are as follows:  Males: 12-300 ng/mL or 12-300 mcg/L (SI units).  Females: 10-150 ng/mL or 10-150 mcg/L (SI units).  Children or adolescents:  Newborn: 25-200 ng/mL.  Less than or equal to 74 month old: 200-600 ng/mL.  2-5 months old: 50-200 ng/mL.  6 months to 71 years old: 7-142 ng/mL. WHAT DO THE RESULTS MEAN? Levels above the reference ranges may indicate:  Anemia due to causes other than iron deficiency.  Inflammatory diseases.  Certain types of cancer.  Disorders that cause iron overload in the body. Levels below the reference ranges may indicate iron deficiency anemia. Talk with your health care provider to discuss your results, treatment options, and if necessary, the need for more tests. Talk with your health care provider if you have any questions about your results.   This information is not intended to replace advice given to you by your health care provider. Make sure you discuss any questions you have with your health care provider.   Document Released: 10/24/2004 Document  Revised: 10/22/2014 Document Reviewed: 02/26/2014 Elsevier Interactive Patient Education 2016 [place] injection What is this medicine? FERUMOXYTOL is an iron complex. Iron is used to make healthy red blood cells, which carry oxygen and nutrients throughout the body. This medicine is used to treat iron deficiency anemia in people with chronic kidney disease. This medicine may be used for other purposes; ask your health care provider or pharmacist if you have questions. What should I tell my health care provider before I take this medicine? They need to know if you have any of these conditions: -anemia not caused by low iron levels -high levels of iron in the blood -magnetic resonance imaging (MRI) test scheduled -an unusual or allergic reaction to iron, other medicines, foods, dyes, or preservatives -pregnant or trying to get pregnant -breast-feeding How should I use this medicine? This medicine is for injection into a vein. It is given by a health care professional in a hospital or clinic setting. Talk to your pediatrician regarding the use of this medicine in children. Special care may be needed. Overdosage: If you think you have taken too much of this medicine contact a poison control center or emergency room at once. NOTE: This medicine is only for you. Do not share this medicine with others. What if I miss a dose? It is important not to miss your dose. Call your doctor or health care professional if you are unable to keep an appointment. What may interact with this medicine? This medicine may interact with the following medications: -other iron products This list may not describe  all possible interactions. Give your health care provider a list of all the medicines, herbs, non-prescription drugs, or dietary supplements you use. Also tell them if you smoke, drink alcohol, or use illegal drugs. Some items may interact with your medicine. What should I watch for while  using this medicine? Visit your doctor or healthcare professional regularly. Tell your doctor or healthcare professional if your symptoms do not start to get better or if they get worse. You may need blood work done while you are taking this medicine. You may need to follow a special diet. Talk to your doctor. Foods that contain iron include: whole grains/cereals, dried fruits, beans, or peas, leafy green vegetables, and organ meats (liver, kidney). What side effects may I notice from receiving this medicine? Side effects that you should report to your doctor or health care professional as soon as possible: -allergic reactions like skin rash, itching or hives, swelling of the face, lips, or tongue -breathing problems -changes in blood pressure -feeling faint or lightheaded, falls -fever or chills -flushing, sweating, or hot feelings -swelling of the ankles or feet Side effects that usually do not require medical attention (Report these to your doctor or health care professional if they continue or are bothersome.): -diarrhea -headache -nausea, vomiting -stomach pain This list may not describe all possible side effects. Call your doctor for medical advice about side effects. You may report side effects to FDA at 1-800-FDA-1088. Where should I keep my medicine? This drug is given in a hospital or clinic and will not be stored at home. NOTE: This sheet is a summary. It may not cover all possible information. If you have questions about this medicine, talk to your doctor, pharmacist, or health care provider.    2016, Elsevier/Gold Standard. (2012-05-16 15:23:36)

## 2016-08-23 NOTE — Assessment & Plan Note (Signed)
The most likely cause of her anemia is due to chronic blood loss/malabsorption syndrome. We discussed some of the risks, benefits, and alternatives of intravenous iron infusions. The patient is symptomatic from anemia and the iron level is critically low. She tolerated oral iron supplement poorly and desires to achieved higher levels of iron faster for adequate hematopoesis. Some of the side-effects to be expected including risks of infusion reactions, phlebitis, headaches, nausea and fatigue.  The patient is willing to proceed. Patient education material was dispensed.  Goal is to keep ferritin level greater than 50  

## 2016-08-23 NOTE — Progress Notes (Signed)
[facility] OFFICE PROGRESS NOTE  No PCP Per Patient SUMMARY OF HEMATOLOGIC HISTORY:  This patient was noted to have combine iron deficiency and B12 deficiency in 2013. She received 1 dose of intravenous iron feraheme on 06/13/2012. Around that same time, she was started on B12 injection on a monthly basis. Subsequently, vitamin B-12 injection was discontinued and her B-12 level dropped. Subsequently, vitamin B-12 injection was resumed and was given every other month.  Starting in of 2016, the frequency of B-12 injection was changed to every 3 months INTERVAL HISTORY: [name] 70 y.o. female returns for follow-up. She complained of fatigue recently. She had recent iron study done which shows significant iron deficiency She has pica with excessive chewing of ice The patient denies any recent signs or symptoms of bleeding such as spontaneous epistaxis, hematuria or hematochezia. She takes aspirin chronically She has tried oral iron in the past but tolerated that poorly causing gastritis and nausea  I have reviewed the past medical history, past surgical history, social history and family history with the patient and they are unchanged from previous note.  ALLERGIES:  is allergic to cephalexin; ciprofloxacin; zyvox [linezolid]; adhesive [tape]; bactrim [sulfamethoxazole-trimethoprim]; daptomycin; and vancomycin.  MEDICATIONS:  Current Outpatient Prescriptions  Medication Sig Dispense Refill  . aspirin EC 325 MG EC tablet Take 325 mg by mouth daily.     [name] atorvastatin (LIPITOR) 40 MG tablet TAKE 1 TABLET BY MOUTH AT BEDTIME 30 tablet 11  . Cyanocobalamin 1000 MCG/ML KIT Inject 1,000 mcg into the skin every 8 (eight) weeks.     . docusate sodium (COLACE) 50 MG capsule Take 50 mg by mouth daily.    . hydrOXYzine (ATARAX/VISTARIL) 10 MG tablet Take 1 tablet (10 mg total) by mouth 3 (three) times daily as needed for itching or anxiety. 30 tablet 0  . insulin detemir  (LEVEMIR) 100 UNIT/ML injection Inject 12-24 Units into the skin 2 (two) times daily. Inject 24 units subutaneously every morning (10am)  and 12 units at bedtime (10pm)    . insulin lispro (HUMALOG) 100 UNIT/ML injection Inject 1-12 Units into the skin 4 (four) times daily as needed for high blood sugar (sliding scale).     . isosorbide mononitrate (IMDUR) 30 MG 24 hr tablet Take 1 tablet (30 mg total) by mouth daily. 90 tablet 3  . levothyroxine (SYNTHROID, LEVOTHROID) 100 MCG tablet Take 100 mcg by mouth daily before breakfast.   5  . torsemide (DEMADEX) 20 MG tablet 2 tablets (105m) by mouth daily  in the morning. 180 tablet 0   No current facility-administered medications for this visit.      REVIEW OF SYSTEMS:   Constitutional: Denies fevers, chills or night sweats Eyes: Denies blurriness of vision Ears, nose, mouth, throat, and face: Denies mucositis or sore throat Respiratory: Denies cough, dyspnea or wheezes Cardiovascular: Denies palpitation, chest discomfort or lower extremity swelling Gastrointestinal:  Denies nausea, heartburn or change in bowel habits Skin: Denies abnormal skin rashes Lymphatics: Denies new lymphadenopathy  Neurological:Denies numbness, tingling or new weaknesses Behavioral/Psych: Mood is stable, no new changes  All other systems were reviewed with the patient and are negative.  PHYSICAL EXAMINATION: ECOG PERFORMANCE STATUS: 1 - Symptomatic but completely ambulatory  Vitals:   08/23/16 1459  BP: (!) 135/50  Pulse: 68  Resp: 18  Temp: 97.5 F (36.4 C)   Filed Weights   08/23/16 1459  Weight: 161 lb 1.6 oz (73.1 kg)    GENERAL:alert, no distress  and comfortable SKIN: skin color, texture, turgor are normal, no rashes or significant lesions EYES: normal, Conjunctiva are pink and non-injected, sclera clear Musculoskeletal:no cyanosis of digits and no clubbing  NEURO: alert & oriented x 3 with fluent speech, no focal motor/sensory  deficits  LABORATORY DATA:  I have reviewed the data as listed     Component Value Date/Time   NA 132 (L) 06/04/2016 1429   NA 137 08/07/2012 1101   K 4.4 06/04/2016 1429   K 4.1 08/07/2012 1101   CL 96 (L) 06/04/2016 1429   CL 102 08/07/2012 1101   CO2 26 06/04/2016 1429   CO2 25 08/07/2012 1101   GLUCOSE 308 (H) 06/04/2016 1429   GLUCOSE 320 (H) 08/07/2012 1101   BUN 32 (H) 06/04/2016 1429   BUN 25.0 08/07/2012 1101   CREATININE 1.57 (H) 06/04/2016 1429   CREATININE 1.0 08/07/2012 1101   CALCIUM 8.8 06/04/2016 1429   CALCIUM 9.8 08/07/2012 1101   PROT 6.7 03/23/2016 1704   ALBUMIN 3.0 (L) 03/23/2016 1704   AST 18 03/23/2016 1704   ALT 12 (L) 03/23/2016 1704   ALKPHOS 95 03/23/2016 1704   BILITOT 0.9 03/23/2016 1704   GFRNONAA 27 (L) 03/25/2016 0552   GFRNONAA 45 (L) 11/17/2013 1635   GFRAA 31 (L) 03/25/2016 0552   GFRAA 52 (L) 11/17/2013 1635    No results found for: SPEP, UPEP  Lab Results  Component Value Date   WBC 5.2 08/23/2016   NEUTROABS 3.6 08/23/2016   HGB 11.9 08/23/2016   HCT 39.1 08/23/2016   MCV 75.9 (L) 08/23/2016   PLT 199 08/23/2016      Chemistry      Component Value Date/Time   NA 132 (L) 06/04/2016 1429   NA 137 08/07/2012 1101   K 4.4 06/04/2016 1429   K 4.1 08/07/2012 1101   CL 96 (L) 06/04/2016 1429   CL 102 08/07/2012 1101   CO2 26 06/04/2016 1429   CO2 25 08/07/2012 1101   BUN 32 (H) 06/04/2016 1429   BUN 25.0 08/07/2012 1101   CREATININE 1.57 (H) 06/04/2016 1429   CREATININE 1.0 08/07/2012 1101      Component Value Date/Time   CALCIUM 8.8 06/04/2016 1429   CALCIUM 9.8 08/07/2012 1101   ALKPHOS 95 03/23/2016 1704   AST 18 03/23/2016 1704   ALT 12 (L) 03/23/2016 1704   BILITOT 0.9 03/23/2016 1704      ASSESSMENT & PLAN:  Iron deficiency anemia The most likely cause of her anemia is due to chronic blood loss/malabsorption syndrome. We discussed some of the risks, benefits, and alternatives of intravenous iron  infusions. The patient is symptomatic from anemia and the iron level is critically low. She tolerated oral iron supplement poorly and desires to achieved higher levels of iron faster for adequate hematopoesis. Some of the side-effects to be expected including risks of infusion reactions, phlebitis, headaches, nausea and fatigue.  The patient is willing to proceed. Patient education material was dispensed.  Goal is to keep ferritin level greater than 50   Vitamin B12 deficiency (dietary) anemia Her last serum vitamin B-12 showed adequate replacement. I recommend we continue injection every 3 months. I recommend consideration for self administration of vitamin B-12 injection but due to her vision difficulties from diabetes, the patient would prefer to come back on a regular basis for B-12 injection. I will see her back next year   No orders of the defined types were placed in this encounter.  All questions were answered. The patient knows to call the clinic with any problems, questions or concerns. No barriers to learning was detected.  I spent 15 minutes counseling the patient face to face. The total time spent in the appointment was 20 minutes and more than 50% was on counseling.     Heath Lark, MD 11/9/20173:10 PM

## 2016-08-24 DIAGNOSIS — I110 Hypertensive heart disease with heart failure: Secondary | ICD-10-CM | POA: Diagnosis not present

## 2016-08-24 DIAGNOSIS — Z4781 Encounter for orthopedic aftercare following surgical amputation: Secondary | ICD-10-CM | POA: Diagnosis not present

## 2016-08-24 DIAGNOSIS — I2510 Atherosclerotic heart disease of native coronary artery without angina pectoris: Secondary | ICD-10-CM | POA: Diagnosis not present

## 2016-08-24 DIAGNOSIS — E1051 Type 1 diabetes mellitus with diabetic peripheral angiopathy without gangrene: Secondary | ICD-10-CM | POA: Diagnosis not present

## 2016-08-24 DIAGNOSIS — M349 Systemic sclerosis, unspecified: Secondary | ICD-10-CM | POA: Diagnosis not present

## 2016-08-24 DIAGNOSIS — I5022 Chronic systolic (congestive) heart failure: Secondary | ICD-10-CM | POA: Diagnosis not present

## 2016-08-25 DIAGNOSIS — E1051 Type 1 diabetes mellitus with diabetic peripheral angiopathy without gangrene: Secondary | ICD-10-CM | POA: Diagnosis not present

## 2016-08-25 DIAGNOSIS — M349 Systemic sclerosis, unspecified: Secondary | ICD-10-CM | POA: Diagnosis not present

## 2016-08-25 DIAGNOSIS — I5022 Chronic systolic (congestive) heart failure: Secondary | ICD-10-CM | POA: Diagnosis not present

## 2016-08-25 DIAGNOSIS — I110 Hypertensive heart disease with heart failure: Secondary | ICD-10-CM | POA: Diagnosis not present

## 2016-08-25 DIAGNOSIS — Z4781 Encounter for orthopedic aftercare following surgical amputation: Secondary | ICD-10-CM | POA: Diagnosis not present

## 2016-08-25 DIAGNOSIS — I2510 Atherosclerotic heart disease of native coronary artery without angina pectoris: Secondary | ICD-10-CM | POA: Diagnosis not present

## 2016-08-27 ENCOUNTER — Telehealth: Payer: Self-pay | Admitting: Hematology and Oncology

## 2016-08-27 ENCOUNTER — Telehealth: Payer: Self-pay | Admitting: *Deleted

## 2016-08-27 DIAGNOSIS — M349 Systemic sclerosis, unspecified: Secondary | ICD-10-CM | POA: Diagnosis not present

## 2016-08-27 DIAGNOSIS — I2510 Atherosclerotic heart disease of native coronary artery without angina pectoris: Secondary | ICD-10-CM | POA: Diagnosis not present

## 2016-08-27 DIAGNOSIS — I110 Hypertensive heart disease with heart failure: Secondary | ICD-10-CM | POA: Diagnosis not present

## 2016-08-27 DIAGNOSIS — Z4781 Encounter for orthopedic aftercare following surgical amputation: Secondary | ICD-10-CM | POA: Diagnosis not present

## 2016-08-27 DIAGNOSIS — I5022 Chronic systolic (congestive) heart failure: Secondary | ICD-10-CM | POA: Diagnosis not present

## 2016-08-27 DIAGNOSIS — E1051 Type 1 diabetes mellitus with diabetic peripheral angiopathy without gangrene: Secondary | ICD-10-CM | POA: Diagnosis not present

## 2016-08-27 NOTE — Telephone Encounter (Signed)
Message received from patient stating that she needs to reschedule her Feraheme appt scheduled for this Thursday.  Message forwarded to [facility] in scheduling and message left with Sharyn Lull regarding patient's needs.

## 2016-08-27 NOTE — Telephone Encounter (Signed)
Pt called to r/s 11/16 appt to 11/17. Pt has new appt date/time

## 2016-08-29 DIAGNOSIS — I2510 Atherosclerotic heart disease of native coronary artery without angina pectoris: Secondary | ICD-10-CM | POA: Diagnosis not present

## 2016-08-29 DIAGNOSIS — Z4781 Encounter for orthopedic aftercare following surgical amputation: Secondary | ICD-10-CM | POA: Diagnosis not present

## 2016-08-29 DIAGNOSIS — M349 Systemic sclerosis, unspecified: Secondary | ICD-10-CM | POA: Diagnosis not present

## 2016-08-29 DIAGNOSIS — E1051 Type 1 diabetes mellitus with diabetic peripheral angiopathy without gangrene: Secondary | ICD-10-CM | POA: Diagnosis not present

## 2016-08-29 DIAGNOSIS — I110 Hypertensive heart disease with heart failure: Secondary | ICD-10-CM | POA: Diagnosis not present

## 2016-08-29 DIAGNOSIS — I5022 Chronic systolic (congestive) heart failure: Secondary | ICD-10-CM | POA: Diagnosis not present

## 2016-08-30 ENCOUNTER — Ambulatory Visit: Payer: Medicare Other

## 2016-08-30 DIAGNOSIS — M349 Systemic sclerosis, unspecified: Secondary | ICD-10-CM | POA: Diagnosis not present

## 2016-08-30 DIAGNOSIS — I110 Hypertensive heart disease with heart failure: Secondary | ICD-10-CM | POA: Diagnosis not present

## 2016-08-30 DIAGNOSIS — Z4781 Encounter for orthopedic aftercare following surgical amputation: Secondary | ICD-10-CM | POA: Diagnosis not present

## 2016-08-30 DIAGNOSIS — I10 Essential (primary) hypertension: Secondary | ICD-10-CM | POA: Diagnosis not present

## 2016-08-30 DIAGNOSIS — E109 Type 1 diabetes mellitus without complications: Secondary | ICD-10-CM | POA: Diagnosis not present

## 2016-08-30 DIAGNOSIS — E039 Hypothyroidism, unspecified: Secondary | ICD-10-CM | POA: Diagnosis not present

## 2016-08-30 DIAGNOSIS — I2510 Atherosclerotic heart disease of native coronary artery without angina pectoris: Secondary | ICD-10-CM | POA: Diagnosis not present

## 2016-08-30 DIAGNOSIS — R809 Proteinuria, unspecified: Secondary | ICD-10-CM | POA: Diagnosis not present

## 2016-08-30 DIAGNOSIS — I5022 Chronic systolic (congestive) heart failure: Secondary | ICD-10-CM | POA: Diagnosis not present

## 2016-08-30 DIAGNOSIS — E1051 Type 1 diabetes mellitus with diabetic peripheral angiopathy without gangrene: Secondary | ICD-10-CM | POA: Diagnosis not present

## 2016-08-31 ENCOUNTER — Ambulatory Visit (HOSPITAL_BASED_OUTPATIENT_CLINIC_OR_DEPARTMENT_OTHER): Payer: Medicare Other

## 2016-08-31 VITALS — BP 138/73 | HR 81 | Temp 97.7°F | Resp 18

## 2016-08-31 DIAGNOSIS — D509 Iron deficiency anemia, unspecified: Secondary | ICD-10-CM | POA: Diagnosis present

## 2016-08-31 DIAGNOSIS — D518 Other vitamin B12 deficiency anemias: Secondary | ICD-10-CM

## 2016-08-31 MED ORDER — SODIUM CHLORIDE 0.9 % IV SOLN
510.0000 mg | Freq: Once | INTRAVENOUS | Status: AC
  Administered 2016-08-31: 510 mg via INTRAVENOUS
  Filled 2016-08-31: qty 17

## 2016-08-31 MED ORDER — SODIUM CHLORIDE 0.9 % IV SOLN
Freq: Once | INTRAVENOUS | Status: AC
  Administered 2016-08-31: 14:00:00 via INTRAVENOUS

## 2016-08-31 NOTE — Patient Instructions (Signed)
Ferumoxytol injection What is this medicine? FERUMOXYTOL is an iron complex. Iron is used to make healthy red blood cells, which carry oxygen and nutrients throughout the body. This medicine is used to treat iron deficiency anemia in people with chronic kidney disease. COMMON BRAND NAME(S): Feraheme What should I tell my health care provider before I take this medicine? They need to know if you have any of these conditions: -anemia not caused by low iron levels -high levels of iron in the blood -magnetic resonance imaging (MRI) test scheduled -an unusual or allergic reaction to iron, other medicines, foods, dyes, or preservatives -pregnant or trying to get pregnant -breast-feeding How should I use this medicine? This medicine is for injection into a vein. It is given by a health care professional in a hospital or clinic setting. Talk to your pediatrician regarding the use of this medicine in children. Special care may be needed. What if I miss a dose? It is important not to miss your dose. Call your doctor or health care professional if you are unable to keep an appointment. What may interact with this medicine? This medicine may interact with the following medications: -other iron products What should I watch for while using this medicine? Visit your doctor or healthcare professional regularly. Tell your doctor or healthcare professional if your symptoms do not start to get better or if they get worse. You may need blood work done while you are taking this medicine. You may need to follow a special diet. Talk to your doctor. Foods that contain iron include: whole grains/cereals, dried fruits, beans, or peas, leafy green vegetables, and organ meats (liver, kidney). What side effects may I notice from receiving this medicine? Side effects that you should report to your doctor or health care professional as soon as possible: -allergic reactions like skin rash, itching or hives, swelling of the  face, lips, or tongue -breathing problems -changes in blood pressure -feeling faint or lightheaded, falls -fever or chills -flushing, sweating, or hot feelings -swelling of the ankles or feet Side effects that usually do not require medical attention (report to your doctor or health care professional if they continue or are bothersome): -diarrhea -headache -nausea, vomiting -stomach pain Where should I keep my medicine? This drug is given in a hospital or clinic and will not be stored at home.  2017 Elsevier/Gold Standard (2015-11-03 12:41:49)  

## 2016-09-04 DIAGNOSIS — E1051 Type 1 diabetes mellitus with diabetic peripheral angiopathy without gangrene: Secondary | ICD-10-CM | POA: Diagnosis not present

## 2016-09-04 DIAGNOSIS — I110 Hypertensive heart disease with heart failure: Secondary | ICD-10-CM | POA: Diagnosis not present

## 2016-09-04 DIAGNOSIS — Z4781 Encounter for orthopedic aftercare following surgical amputation: Secondary | ICD-10-CM | POA: Diagnosis not present

## 2016-09-04 DIAGNOSIS — I5022 Chronic systolic (congestive) heart failure: Secondary | ICD-10-CM | POA: Diagnosis not present

## 2016-09-04 DIAGNOSIS — M349 Systemic sclerosis, unspecified: Secondary | ICD-10-CM | POA: Diagnosis not present

## 2016-09-04 DIAGNOSIS — I2510 Atherosclerotic heart disease of native coronary artery without angina pectoris: Secondary | ICD-10-CM | POA: Diagnosis not present

## 2016-09-05 DIAGNOSIS — I2510 Atherosclerotic heart disease of native coronary artery without angina pectoris: Secondary | ICD-10-CM | POA: Diagnosis not present

## 2016-09-05 DIAGNOSIS — E1051 Type 1 diabetes mellitus with diabetic peripheral angiopathy without gangrene: Secondary | ICD-10-CM | POA: Diagnosis not present

## 2016-09-05 DIAGNOSIS — I110 Hypertensive heart disease with heart failure: Secondary | ICD-10-CM | POA: Diagnosis not present

## 2016-09-05 DIAGNOSIS — I5022 Chronic systolic (congestive) heart failure: Secondary | ICD-10-CM | POA: Diagnosis not present

## 2016-09-05 DIAGNOSIS — M349 Systemic sclerosis, unspecified: Secondary | ICD-10-CM | POA: Diagnosis not present

## 2016-09-05 DIAGNOSIS — Z4781 Encounter for orthopedic aftercare following surgical amputation: Secondary | ICD-10-CM | POA: Diagnosis not present

## 2016-09-11 ENCOUNTER — Encounter: Payer: Self-pay | Admitting: Internal Medicine

## 2016-09-12 DIAGNOSIS — I2510 Atherosclerotic heart disease of native coronary artery without angina pectoris: Secondary | ICD-10-CM | POA: Diagnosis not present

## 2016-09-12 DIAGNOSIS — I110 Hypertensive heart disease with heart failure: Secondary | ICD-10-CM | POA: Diagnosis not present

## 2016-09-12 DIAGNOSIS — E1051 Type 1 diabetes mellitus with diabetic peripheral angiopathy without gangrene: Secondary | ICD-10-CM | POA: Diagnosis not present

## 2016-09-12 DIAGNOSIS — M349 Systemic sclerosis, unspecified: Secondary | ICD-10-CM | POA: Diagnosis not present

## 2016-09-12 DIAGNOSIS — Z4781 Encounter for orthopedic aftercare following surgical amputation: Secondary | ICD-10-CM | POA: Diagnosis not present

## 2016-09-12 DIAGNOSIS — I5022 Chronic systolic (congestive) heart failure: Secondary | ICD-10-CM | POA: Diagnosis not present

## 2016-09-14 ENCOUNTER — Other Ambulatory Visit: Payer: Self-pay

## 2016-09-14 DIAGNOSIS — M349 Systemic sclerosis, unspecified: Secondary | ICD-10-CM | POA: Diagnosis not present

## 2016-09-14 DIAGNOSIS — I5022 Chronic systolic (congestive) heart failure: Secondary | ICD-10-CM | POA: Diagnosis not present

## 2016-09-14 DIAGNOSIS — I110 Hypertensive heart disease with heart failure: Secondary | ICD-10-CM | POA: Diagnosis not present

## 2016-09-14 DIAGNOSIS — E1051 Type 1 diabetes mellitus with diabetic peripheral angiopathy without gangrene: Secondary | ICD-10-CM | POA: Diagnosis not present

## 2016-09-14 DIAGNOSIS — I2510 Atherosclerotic heart disease of native coronary artery without angina pectoris: Secondary | ICD-10-CM | POA: Diagnosis not present

## 2016-09-14 DIAGNOSIS — Z4781 Encounter for orthopedic aftercare following surgical amputation: Secondary | ICD-10-CM | POA: Diagnosis not present

## 2016-09-14 DIAGNOSIS — I739 Peripheral vascular disease, unspecified: Secondary | ICD-10-CM

## 2016-09-16 NOTE — Progress Notes (Signed)
Cardiology Office Note   Date:  09/17/2016   ID:  [name], DOB 1946-06-25, MRN [id number]  PCP:  No PCP Per Patient  Cardiologist:   Dorris Carnes, MD    F/U of CAD     History of Present Illness: [name] is a 70 y.o. female with a history of  CAD (s/p CABG- cath in 09/2011 showed patent LIMA to LAD with occlusion of distal LAD; High grade stenosis of nondominant RCA; Signifcant disease of prox LCx; SVG to L PDA occluded, SVG to OM patent), Aortic stenosis, DM, dyslipidemia, PVD and CKD, Echo Oct 2016 LVEF 25 to 30%, RVEF mod depressed; mod aortic stenosis. I saw the pt in October   She says that she is doing much better now that she has had iron infusion  Breathing is better  She is able to do more  Skin actually feels better  Still has some LE edema but improved  Weight is down ANkles are still wrapped    Outpatient Medications Prior to Visit  Medication Sig Dispense Refill  . aspirin EC 325 MG EC tablet Take 325 mg by mouth daily.     [name] atorvastatin (LIPITOR) 40 MG tablet TAKE 1 TABLET BY MOUTH AT BEDTIME 30 tablet 11  . Cyanocobalamin 1000 MCG/ML KIT Inject 1,000 mcg into the skin every 8 (eight) weeks.     . docusate sodium (COLACE) 50 MG capsule Take 50 mg by mouth daily.    . hydrOXYzine (ATARAX/VISTARIL) 10 MG tablet Take 1 tablet (10 mg total) by mouth 3 (three) times daily as needed for itching or anxiety. 30 tablet 0  . insulin detemir (LEVEMIR) 100 UNIT/ML injection Inject 12-24 Units into the skin 2 (two) times daily. Inject 24 units subutaneously every morning (10am)  and 12 units at bedtime (10pm)    . insulin lispro (HUMALOG) 100 UNIT/ML injection Inject 1-12 Units into the skin 4 (four) times daily as needed for high blood sugar (sliding scale).     . isosorbide mononitrate (IMDUR) 30 MG 24 hr tablet Take 1 tablet (30 mg total) by mouth daily. 90 tablet 3  . levothyroxine (SYNTHROID, LEVOTHROID) 100 MCG tablet Take 100 mcg by mouth daily  before breakfast.   5  . torsemide (DEMADEX) 20 MG tablet 2 tablets (31m) by mouth daily  in the morning. 180 tablet 0   No facility-administered medications prior to visit.      Allergies:   Cephalexin; Ciprofloxacin; Zyvox [linezolid]; Adhesive [tape]; Bactrim [sulfamethoxazole-trimethoprim]; Daptomycin; and Vancomycin   Past Medical History:  Diagnosis Date  . Anemia   . Anginal pain ([place]    "history; not currently" (08/28/2012)  . Aortic stenosis    0.8cm2 on echo in 10/2011  . Arthritis    "hands, hips, all over" (08/28/2012  . B12 deficiency anemia    B 12 injection "monthly since 05/2012" (08/28/2012)  . Bilateral carpal tunnel syndrome 1970s  . Blood transfusion    no reaction from transfusion; "when I had heart surgery" (08/28/2012)  . Breast cancer ([place]    left  . CAD (coronary artery disease)    s/p CABG x3 in 1993; last cath in 2010  . Cat bite 2009   with MRSA; required I & D  . Cellulitis   . Cellulitis June 2013, Nov. 2013 and Feb. 14, 2014   Left leg  . CHF (congestive heart failure) ([place] Aug. 2012   Echo 10/2011: Mild LVH, EF 30%, apex akinetic, septal HK,  grade 2 diastolic dysfunction, or functionally bicuspid aortic valve, mild aortic stenosis by gradient, severe by calculated AVA-suspect A. Aortic stenosis probably moderate, trivial MR, mild LAE, mild RAE.   [name] Claudication ([place])   . Exertional dyspnea    "because of the heart failure" (08/28/2012)  . GERD (gastroesophageal reflux disease)   . Heart murmur   . History of bronchitis    "I've had it twice in my life" (08/28/2012)  . History of recurrent UTIs   . HTN (hypertension)   . Hyperlipidemia    on Lipitor  . Hypothyroidism   . Iron deficiency anemia 06/13/2012   "iron infusion" (08/28/2012  . Myocardial infarction 1986   "silent"  . Osteoarthritis of both feet   . Osteomyelitis ([place])    left foot  . PVD (peripheral vascular disease) ([place])    Toes amputated from left foot and has had prior  bypass on left leg. Followed by Dr. Donnetta Hutching  . Scleroderma ([place])   . Seizure ([place])    ? seizure like event in 2010; workup included stress testing which led to repeat cath.   . Stomach ulcer 1981?   "on Tagament for ~ 1 yr" in 45s  . Type 1 diabetes ([place]) 10/1949    Past Surgical History:  Procedure Laterality Date  . AMPUTATION Right 05/27/2013   Procedure: AMPUTATION DIGIT FIFTH TOE;  Surgeon: Rosetta Posner, MD;  Location[facility] OR;  Service: Vascular;  Laterality: Right;  . AMPUTATION Left 03/21/2016   Procedure: Left Transmetatarsal Amputation;  Surgeon: Newt Minion, MD;  Location: [facility];  Service: Orthopedics;  Laterality: Left;  . APPENDECTOMY  1991  . BREAST BIOPSY    . BREAST BIOPSY WITH SENTINEL LYMPH NODE BIOPSY AND NEEDLE LOCALIZATION  1984  . CARDIAC CATHETERIZATION  2010   LIMA to LAD patent yet with occluded distal LAD after graft insertion & retrograde is occluded. 3-4 septal perforators arise &are patent. SVG to a large marginal patent; SVG presumably to the distal dominant LCX is occluded, moderately high grade stenosis of the AV circumflex, but with distal stenoses involving a severely diseased marginal branch not amenable  to PCI  nor is inferior branch   . CARDIAC CATHETERIZATION  10/2011   "unsuccessful attempt of stenting" (08/28/2012)  . CARDIAC CATHETERIZATION  1992; 09/2011  . CARPAL TUNNEL RELEASE  ~ 1970's   bilaterally  . CATARACT EXTRACTION W/ INTRAOCULAR LENS  IMPLANT, BILATERAL  1990's  . CORONARY ARTERY BYPASS GRAFT  1992   LIMA to LAD, SVG to distal LCX & SVG to Marginal  . FEMORAL BYPASS     left leg "related to transmetatarsal amputation" (08/28/2012)  . INCISION AND DRAINAGE OF WOUND  ~ 1967   "infection in left foot" (08/28/2012)  . INCISION AND DRAINAGE OF WOUND  2009   "3 ORs on my right hand after cat bite" (08/28/2012)  . LEFT AND RIGHT HEART CATHETERIZATION WITH CORONARY ANGIOGRAM N/A 09/17/2011   Procedure: LEFT AND RIGHT HEART CATHETERIZATION  WITH CORONARY ANGIOGRAM;  Surgeon: Hillary Bow, MD;  Location: [facility] CATH LAB;  Service: Cardiovascular;  Laterality: N/A;  . LOWER EXTREMITY ANGIOGRAM N/A 05/25/2013   Procedure: LOWER EXTREMITY ANGIOGRAM POSS INTERVENTION;  Surgeon: Conrad [place], MD;  Location: [facility] CATH LAB;  Service: Cardiovascular;  Laterality: N/A;  . MASTECTOMY  11/1982   Left Breast  . PERCUTANEOUS CORONARY STENT INTERVENTION (PCI-S) N/A 11/15/2011   Procedure: PERCUTANEOUS CORONARY STENT INTERVENTION (PCI-S);  Surgeon: Hillary Bow, MD;  Location: [facility] CATH LAB;  Service: Cardiovascular;  Laterality: N/A;  . TONSILLECTOMY  1952   "?adenoids"   . TRANSMETATARSAL AMPUTATION  04/2009 - 10/2009   "4 ORs; left foot"  . TRIGGER FINGER RELEASE  1980's   right thumb  . TUBAL LIGATION  1984  . VITRECTOMY  1990's-2004   "both eyes; I've had total of 4"     Social History:  The patient  reports that she has never smoked. She has never used smokeless tobacco. She reports that she does not drink alcohol or use drugs.   Family History:  The patient's family history includes Diabetes in her mother; Hypertension in her mother; Stroke in her father and mother.    ROS:  Please see the history of present illness. All other systems are reviewed and  Negative to the above problem except as noted.    PHYSICAL EXAM: VS:  BP 130/60   [name] 78   Ht _0  (1.6 m)   Wt 154 lb 12.8 oz (70.2 kg)   SpO2 97%   BMI 27.42 kg/m   GEN: Well nourished, well developed, in no acute distress  HEENT: normal  Neck: no JVD, carotid bruits, or masses Cardiac: RRR; no murmurs, rubs, or gallops,  Tr to 1 + edema  Legs wrapped  She unrapped  Respiratory:  clear to auscultation bilaterally, normal work of breathing GI: soft, nontender, nondistended, + BS  No hepatomegaly  MS: s/p  : tramsmetatarsal amputation  Moving all extremities   Skin: warm and dry, no rash  Erythema of legs  Knees red   Neuro:  Strength and sensation are intact Psych:  euthymic mood, full affect   EKG:  EKG is  Not ordered today.   Lipid Panel    Component Value Date/Time   CHOL 136 12/02/2015 1522   TRIG 93 12/02/2015 1522   HDL 35 (L) 12/02/2015 1522   CHOLHDL 3.9 12/02/2015 1522   VLDL 19 12/02/2015 1522   LDLCALC 82 12/02/2015 1522   LDLDIRECT 132.7 04/22/2007 0815      Wt Readings from Last 3 Encounters:  09/17/16 154 lb 12.8 oz (70.2 kg)  08/23/16 161 lb 1.6 oz (73.1 kg)  08/22/16 163 lb (73.9 kg)      ASSESSMENT AND PLAN:  1 CAD  No symptmsto suggest angina    2  CHF  Chronic systolic CHF  Volume is up some but improved from previous  Check labs today    3  Fe deficiency  Felling better with infusion  WIll check CBC  4  HL  Keep on statin   Will set f/u in clinic for the spring      Current medicines are reviewed at length with the patient today.  The patient does not have concerns regarding medicines.  Signed, Dorris Carnes, MD  09/17/2016 11:02 AM    [facility] [place], [place], [place]  [address] Phone: [phone]; Fax: [phone]

## 2016-09-17 ENCOUNTER — Encounter: Payer: Self-pay | Admitting: Internal Medicine

## 2016-09-17 ENCOUNTER — Ambulatory Visit (INDEPENDENT_AMBULATORY_CARE_PROVIDER_SITE_OTHER): Payer: Medicare Other | Admitting: Internal Medicine

## 2016-09-17 VITALS — BP 130/60 | HR 78 | Ht 63.0 in | Wt 154.8 lb

## 2016-09-17 DIAGNOSIS — I779 Disorder of arteries and arterioles, unspecified: Secondary | ICD-10-CM

## 2016-09-17 DIAGNOSIS — E785 Hyperlipidemia, unspecified: Secondary | ICD-10-CM

## 2016-09-17 DIAGNOSIS — I5042 Chronic combined systolic (congestive) and diastolic (congestive) heart failure: Secondary | ICD-10-CM | POA: Diagnosis not present

## 2016-09-17 LAB — LIPID PANEL
Cholesterol: 171 mg/dL (ref <200)
HDL: 55 mg/dL (ref >50)
LDL CALC: 98 mg/dL (ref <100)
TRIGLYCERIDES: 92 mg/dL (ref <150)
Total CHOL/HDL Ratio: 3.1 Ratio (ref <5.0)
VLDL: 18 mg/dL (ref <30)

## 2016-09-17 LAB — CBC
HEMATOCRIT: 40.2 % (ref 35.0–45.0)
Hemoglobin: 12.3 g/dL (ref 11.7–15.5)
MCH: 25.4 pg — ABNORMAL LOW (ref 27.0–33.0)
MCHC: 30.6 g/dL — ABNORMAL LOW (ref 32.0–36.0)
MCV: 82.9 fL (ref 80.0–100.0)
MPV: 9.2 fL (ref 7.5–12.5)
PLATELETS: 166 10*3/uL (ref 140–400)
RBC: 4.85 MIL/uL (ref 3.80–5.10)
RDW: 25.3 % — ABNORMAL HIGH (ref 11.0–15.0)
WBC: 5.7 10*3/uL (ref 3.8–10.8)

## 2016-09-17 LAB — BASIC METABOLIC PANEL
BUN: 37 mg/dL — AB (ref 7–25)
CHLORIDE: 101 mmol/L (ref 98–110)
CO2: 29 mmol/L (ref 20–31)
CREATININE: 1.56 mg/dL — AB (ref 0.60–0.93)
Calcium: 8.9 mg/dL (ref 8.6–10.4)
Glucose, Bld: 317 mg/dL — ABNORMAL HIGH (ref 65–99)
Potassium: 4.3 mmol/L (ref 3.5–5.3)
Sodium: 142 mmol/L (ref 135–146)

## 2016-09-17 NOTE — Patient Instructions (Signed)
Your physician recommends that you continue on your current medications as directed. Please refer to the Current Medication list given to you today.  Your physician recommends that you return for lab work today (BMET, CBC, BNP)  Your physician recommends that you schedule a follow-up appointment [place].

## 2016-09-18 DIAGNOSIS — I110 Hypertensive heart disease with heart failure: Secondary | ICD-10-CM | POA: Diagnosis not present

## 2016-09-18 DIAGNOSIS — M349 Systemic sclerosis, unspecified: Secondary | ICD-10-CM | POA: Diagnosis not present

## 2016-09-18 DIAGNOSIS — Z4781 Encounter for orthopedic aftercare following surgical amputation: Secondary | ICD-10-CM | POA: Diagnosis not present

## 2016-09-18 DIAGNOSIS — I5022 Chronic systolic (congestive) heart failure: Secondary | ICD-10-CM | POA: Diagnosis not present

## 2016-09-18 DIAGNOSIS — I2510 Atherosclerotic heart disease of native coronary artery without angina pectoris: Secondary | ICD-10-CM | POA: Diagnosis not present

## 2016-09-18 DIAGNOSIS — E1051 Type 1 diabetes mellitus with diabetic peripheral angiopathy without gangrene: Secondary | ICD-10-CM | POA: Diagnosis not present

## 2016-09-18 LAB — BRAIN NATRIURETIC PEPTIDE: Brain Natriuretic Peptide: 749.5 pg/mL — ABNORMAL HIGH (ref <100)

## 2016-09-20 ENCOUNTER — Telehealth: Payer: Self-pay | Admitting: Internal Medicine

## 2016-09-20 DIAGNOSIS — M349 Systemic sclerosis, unspecified: Secondary | ICD-10-CM | POA: Diagnosis not present

## 2016-09-20 DIAGNOSIS — I2510 Atherosclerotic heart disease of native coronary artery without angina pectoris: Secondary | ICD-10-CM | POA: Diagnosis not present

## 2016-09-20 DIAGNOSIS — I5042 Chronic combined systolic (congestive) and diastolic (congestive) heart failure: Secondary | ICD-10-CM

## 2016-09-20 DIAGNOSIS — Z4781 Encounter for orthopedic aftercare following surgical amputation: Secondary | ICD-10-CM | POA: Diagnosis not present

## 2016-09-20 DIAGNOSIS — I5022 Chronic systolic (congestive) heart failure: Secondary | ICD-10-CM | POA: Diagnosis not present

## 2016-09-20 DIAGNOSIS — E1051 Type 1 diabetes mellitus with diabetic peripheral angiopathy without gangrene: Secondary | ICD-10-CM | POA: Diagnosis not present

## 2016-09-20 DIAGNOSIS — I110 Hypertensive heart disease with heart failure: Secondary | ICD-10-CM | POA: Diagnosis not present

## 2016-09-20 NOTE — Telephone Encounter (Signed)
New message ° ° ° ° °Returning a call to the nurse to get lab results °

## 2016-09-20 NOTE — Telephone Encounter (Signed)
That sounds good

## 2016-09-20 NOTE — Telephone Encounter (Signed)
Notes Recorded by Fay Records, MD on 09/19/2016 at 11:33 AM EST Kidney function is a little better  Fluid number a little better LDL has been lower Is 98  I would recomm switch to Crestor 20 More potent See if can drop to 70-80 F/U lipids and BMET in 2 mont from switch    Pt reports she misplaced her atorvastatin bottle for a couple weeks, and prior to that she was not taking every day, skipped a day here and there.  Rather than change medications right now, she would prefer to take the atorvastatin daily, if Dr. Harrington Challenger is agreeable to this.  I have scheduled her for repeat labs in 2 months.

## 2016-09-24 DIAGNOSIS — I2510 Atherosclerotic heart disease of native coronary artery without angina pectoris: Secondary | ICD-10-CM | POA: Diagnosis not present

## 2016-09-24 DIAGNOSIS — Z89422 Acquired absence of other left toe(s): Secondary | ICD-10-CM | POA: Diagnosis not present

## 2016-09-24 DIAGNOSIS — Z7982 Long term (current) use of aspirin: Secondary | ICD-10-CM | POA: Diagnosis not present

## 2016-09-24 DIAGNOSIS — M349 Systemic sclerosis, unspecified: Secondary | ICD-10-CM | POA: Diagnosis not present

## 2016-09-24 DIAGNOSIS — Z87440 Personal history of urinary (tract) infections: Secondary | ICD-10-CM | POA: Diagnosis not present

## 2016-09-24 DIAGNOSIS — E1051 Type 1 diabetes mellitus with diabetic peripheral angiopathy without gangrene: Secondary | ICD-10-CM | POA: Diagnosis not present

## 2016-09-24 DIAGNOSIS — K219 Gastro-esophageal reflux disease without esophagitis: Secondary | ICD-10-CM | POA: Diagnosis not present

## 2016-09-24 DIAGNOSIS — E039 Hypothyroidism, unspecified: Secondary | ICD-10-CM | POA: Diagnosis not present

## 2016-09-24 DIAGNOSIS — E785 Hyperlipidemia, unspecified: Secondary | ICD-10-CM | POA: Diagnosis not present

## 2016-09-24 DIAGNOSIS — M150 Primary generalized (osteo)arthritis: Secondary | ICD-10-CM | POA: Diagnosis not present

## 2016-09-24 DIAGNOSIS — Z794 Long term (current) use of insulin: Secondary | ICD-10-CM | POA: Diagnosis not present

## 2016-09-24 DIAGNOSIS — I252 Old myocardial infarction: Secondary | ICD-10-CM | POA: Diagnosis not present

## 2016-09-24 DIAGNOSIS — Z4781 Encounter for orthopedic aftercare following surgical amputation: Secondary | ICD-10-CM | POA: Diagnosis not present

## 2016-09-24 DIAGNOSIS — Z853 Personal history of malignant neoplasm of breast: Secondary | ICD-10-CM | POA: Diagnosis not present

## 2016-09-24 DIAGNOSIS — E1042 Type 1 diabetes mellitus with diabetic polyneuropathy: Secondary | ICD-10-CM | POA: Diagnosis not present

## 2016-09-24 DIAGNOSIS — I110 Hypertensive heart disease with heart failure: Secondary | ICD-10-CM | POA: Diagnosis not present

## 2016-09-24 DIAGNOSIS — D510 Vitamin B12 deficiency anemia due to intrinsic factor deficiency: Secondary | ICD-10-CM | POA: Diagnosis not present

## 2016-09-24 DIAGNOSIS — I5022 Chronic systolic (congestive) heart failure: Secondary | ICD-10-CM | POA: Diagnosis not present

## 2016-09-24 DIAGNOSIS — Z951 Presence of aortocoronary bypass graft: Secondary | ICD-10-CM | POA: Diagnosis not present

## 2016-09-25 ENCOUNTER — Telehealth: Payer: Self-pay | Admitting: Internal Medicine

## 2016-09-25 NOTE — Telephone Encounter (Signed)
Please place order for home health.

## 2016-09-25 NOTE — Telephone Encounter (Signed)
Called patient.  Patient states that a request for PT has been sent to Dr Harrington Challenger. She states that [facility] needs an order for PT.  States that they are supposed to come on this Thursday if they receive an order.

## 2016-09-25 NOTE — Telephone Encounter (Signed)
New Message  [facility] voiced from [facility] they're needing order from [facility] for plan of care for home health nurse.  Jeannie Delcambri voiced she would like a call back once order reviewed/processed.  Please f/u

## 2016-09-25 NOTE — Telephone Encounter (Signed)
New message  Pt is calling in reference to a referral/approval for physical therapy  Please call back and advise

## 2016-09-26 NOTE — Telephone Encounter (Signed)
I spoke with Jeannie.  She is requesting order for pt to continue PT.  I told her this would be OK.  They will send order for Dr. Harrington Challenger to sign.

## 2016-09-28 DIAGNOSIS — E1051 Type 1 diabetes mellitus with diabetic peripheral angiopathy without gangrene: Secondary | ICD-10-CM | POA: Diagnosis not present

## 2016-09-28 DIAGNOSIS — I2510 Atherosclerotic heart disease of native coronary artery without angina pectoris: Secondary | ICD-10-CM | POA: Diagnosis not present

## 2016-09-28 DIAGNOSIS — M349 Systemic sclerosis, unspecified: Secondary | ICD-10-CM | POA: Diagnosis not present

## 2016-09-28 DIAGNOSIS — I5022 Chronic systolic (congestive) heart failure: Secondary | ICD-10-CM | POA: Diagnosis not present

## 2016-09-28 DIAGNOSIS — Z4781 Encounter for orthopedic aftercare following surgical amputation: Secondary | ICD-10-CM | POA: Diagnosis not present

## 2016-09-28 DIAGNOSIS — I110 Hypertensive heart disease with heart failure: Secondary | ICD-10-CM | POA: Diagnosis not present

## 2016-10-02 ENCOUNTER — Telehealth: Payer: Self-pay

## 2016-10-02 DIAGNOSIS — I2510 Atherosclerotic heart disease of native coronary artery without angina pectoris: Secondary | ICD-10-CM | POA: Diagnosis not present

## 2016-10-02 DIAGNOSIS — E1051 Type 1 diabetes mellitus with diabetic peripheral angiopathy without gangrene: Secondary | ICD-10-CM | POA: Diagnosis not present

## 2016-10-02 DIAGNOSIS — M349 Systemic sclerosis, unspecified: Secondary | ICD-10-CM | POA: Diagnosis not present

## 2016-10-02 DIAGNOSIS — I5022 Chronic systolic (congestive) heart failure: Secondary | ICD-10-CM | POA: Diagnosis not present

## 2016-10-02 DIAGNOSIS — Z4781 Encounter for orthopedic aftercare following surgical amputation: Secondary | ICD-10-CM | POA: Diagnosis not present

## 2016-10-02 DIAGNOSIS — I110 Hypertensive heart disease with heart failure: Secondary | ICD-10-CM | POA: Diagnosis not present

## 2016-10-02 NOTE — Telephone Encounter (Signed)
Pt requesting a call back. She needs to r/s her injection appt of 12/21. She wants to change it to afternoon of 12/22.  Called pt back with change of appt

## 2016-10-03 ENCOUNTER — Telehealth: Payer: Self-pay | Admitting: Internal Medicine

## 2016-10-03 ENCOUNTER — Encounter: Payer: Self-pay | Admitting: Internal Medicine

## 2016-10-03 NOTE — Telephone Encounter (Signed)
New message      Calling to report that at rest, patient's oxygen was 90-91%.  She walked approx 33ft and it dropped to 84-85%.

## 2016-10-04 ENCOUNTER — Telehealth: Payer: Self-pay | Admitting: *Deleted

## 2016-10-04 ENCOUNTER — Ambulatory Visit: Payer: Medicare Other

## 2016-10-04 DIAGNOSIS — Z4781 Encounter for orthopedic aftercare following surgical amputation: Secondary | ICD-10-CM | POA: Diagnosis not present

## 2016-10-04 DIAGNOSIS — I5022 Chronic systolic (congestive) heart failure: Secondary | ICD-10-CM | POA: Diagnosis not present

## 2016-10-04 DIAGNOSIS — M349 Systemic sclerosis, unspecified: Secondary | ICD-10-CM | POA: Diagnosis not present

## 2016-10-04 DIAGNOSIS — E1051 Type 1 diabetes mellitus with diabetic peripheral angiopathy without gangrene: Secondary | ICD-10-CM | POA: Diagnosis not present

## 2016-10-04 DIAGNOSIS — I2510 Atherosclerotic heart disease of native coronary artery without angina pectoris: Secondary | ICD-10-CM | POA: Diagnosis not present

## 2016-10-04 DIAGNOSIS — I110 Hypertensive heart disease with heart failure: Secondary | ICD-10-CM | POA: Diagnosis not present

## 2016-10-04 NOTE — Telephone Encounter (Signed)
Follow Up:   Pt said she was told to call and ask for triage nurse.Her Physical Therapy is there and wants to talk to a nurse.

## 2016-10-04 NOTE — Telephone Encounter (Signed)
Spoke with patient in regards to previous emails that were discussed with Michalene this week. Patient called to report low O2 Sat levels with exertion during PT sessions. Per PT on yesterday, patient's resting O2 Sat was 92% and after ambulation of 90 ft, patient's O2 Sat was ranging between 84%-85%. Today patient's O2 Sat was ranging in the mid 70's%. Per PT, it was mentioned that patient may need supplemental O2.to be used as needed. Per PT, patient's HR has been staying WNL between 72-76 bpm. Patient denies sob, chest pain or dizziness other than when she is walking and said its just a feeling of a little out of breath per PT. Per PT, patient's recovery back to baseline is about 5-6 minutes. Patient is not scheduled for any other home PT sessions this week and will resume PT next week on Wednesday. Patient and PT advised that Dr. Harrington Challenger will be back in the office next Thursday and they would be contacted during that time with a response. Patient did request that Dr. Harrington Challenger consult with Alvy Bimler about this issue. Nurse advised patient that her request would be sent to her doctor. Please advise.

## 2016-10-04 NOTE — Telephone Encounter (Signed)
Please see today's phone message.

## 2016-10-05 ENCOUNTER — Ambulatory Visit: Payer: Medicare Other

## 2016-10-10 DIAGNOSIS — E1051 Type 1 diabetes mellitus with diabetic peripheral angiopathy without gangrene: Secondary | ICD-10-CM | POA: Diagnosis not present

## 2016-10-10 DIAGNOSIS — I2510 Atherosclerotic heart disease of native coronary artery without angina pectoris: Secondary | ICD-10-CM | POA: Diagnosis not present

## 2016-10-10 DIAGNOSIS — I110 Hypertensive heart disease with heart failure: Secondary | ICD-10-CM | POA: Diagnosis not present

## 2016-10-10 DIAGNOSIS — I5022 Chronic systolic (congestive) heart failure: Secondary | ICD-10-CM | POA: Diagnosis not present

## 2016-10-10 DIAGNOSIS — M349 Systemic sclerosis, unspecified: Secondary | ICD-10-CM | POA: Diagnosis not present

## 2016-10-10 DIAGNOSIS — Z4781 Encounter for orthopedic aftercare following surgical amputation: Secondary | ICD-10-CM | POA: Diagnosis not present

## 2016-10-11 ENCOUNTER — Other Ambulatory Visit: Payer: Self-pay | Admitting: Hematology and Oncology

## 2016-10-11 ENCOUNTER — Telehealth: Payer: Self-pay | Admitting: *Deleted

## 2016-10-11 ENCOUNTER — Telehealth: Payer: Self-pay | Admitting: Internal Medicine

## 2016-10-11 DIAGNOSIS — I5042 Chronic combined systolic (congestive) and diastolic (congestive) heart failure: Secondary | ICD-10-CM

## 2016-10-11 NOTE — Telephone Encounter (Signed)
Physical Therapy calling back.  States they called last week with same concerns for pt and have not heard back from our office - resting O2 sats 90% and dropping to mid 80s w/ ambulation. Questioning if need order for home O2 or OV to address issue. Also inquiring about what labs need to be drawn for pt.  Last lab work drawn was beginning of month and nothing drawn since -- BNP, BMET & CBCD all abnormal.  She is asking if they can get verbal order for nurse to re-draw labs and which to be collected. Will forward to Dr. Harrington Challenger to address.

## 2016-10-11 NOTE — Telephone Encounter (Signed)
Called and spoke with [facility] from [facility] and advised that Dr. Harrington Challenger would like to order lab work and oxygen. I asked Beth if patient has correct documentation to obtain oxygen without coming into the office and she states she has been keeping records of patient's low O2 sats.  I advised that I will place orders in epic and fax a copy of phone note to [phone]. I advised Beth to call back with questions or concerns.

## 2016-10-11 NOTE — Telephone Encounter (Signed)
Follow up       Talk to the nurse about the labs being ordered thru [facility]

## 2016-10-11 NOTE — Telephone Encounter (Signed)
New Message   Physical Therapy assistant has been speaking with Dr. Harrington Challenger nurse about needing order for nursing to draw labs. At rest patient O2 is 90%, and walking drops to mid 80's.

## 2016-10-11 NOTE — Telephone Encounter (Signed)
Melissa with [facility] needs documentation of patient's O2 sat  1-RA at rest 2-RA with ambulation 3- on -L O2 with ambulation.   [facility] cannot do this testing due to conflicts. Made patient an appointment tomorrow with Tommye Standard PA to follow-up on symptoms of SOB and oxygen levels.   Called patient with appointment time and date, and she is agreeable to plan.

## 2016-10-11 NOTE — Telephone Encounter (Signed)
Follow up      Talk to the nurse about an order for oxygen

## 2016-10-11 NOTE — Telephone Encounter (Signed)
lmtcb

## 2016-10-11 NOTE — Telephone Encounter (Signed)
Order home O2 Set up for BMET, BNP and CBC

## 2016-10-11 NOTE — Telephone Encounter (Signed)
Hi Cameo, can you call Dr. Harrington Challenger office to see if they can add ferritin to lab draw in his office?

## 2016-10-11 NOTE — Telephone Encounter (Signed)
"   I have a dilemma.  I need my iron levels checked again. If Dr. Alvy Bimler can think of any lab test I need other than the cbc, CMET and BNP when I go to Dr. Harrington Challenger office 10-16-2016 call Dr. Alan Ripper office with these orders.  I received iron infusion which helped my oxygen saturations but they're inconsistently low.  Home Health comes to my home after my foot surgery.  I am up with a walker and have progressed from walking 40 feet to 130 feet before having to stop to rest.  Three weeks ago my saturation = 75 % with exertion walking.  Today saturation = 98 % with exertion.  I have CHF with S.O.B. Anyway.  My body does not feel bad.  Return number [phone]."

## 2016-10-12 ENCOUNTER — Ambulatory Visit (INDEPENDENT_AMBULATORY_CARE_PROVIDER_SITE_OTHER): Payer: Medicare Other | Admitting: Physician Assistant

## 2016-10-12 ENCOUNTER — Encounter: Payer: Self-pay | Admitting: Physician Assistant

## 2016-10-12 VITALS — BP 132/62 | HR 74 | Ht 63.5 in | Wt 156.0 lb

## 2016-10-12 DIAGNOSIS — E611 Iron deficiency: Secondary | ICD-10-CM

## 2016-10-12 DIAGNOSIS — R0602 Shortness of breath: Secondary | ICD-10-CM | POA: Diagnosis not present

## 2016-10-12 DIAGNOSIS — I110 Hypertensive heart disease with heart failure: Secondary | ICD-10-CM | POA: Diagnosis not present

## 2016-10-12 DIAGNOSIS — I5042 Chronic combined systolic (congestive) and diastolic (congestive) heart failure: Secondary | ICD-10-CM

## 2016-10-12 DIAGNOSIS — M349 Systemic sclerosis, unspecified: Secondary | ICD-10-CM | POA: Diagnosis not present

## 2016-10-12 DIAGNOSIS — I255 Ischemic cardiomyopathy: Secondary | ICD-10-CM

## 2016-10-12 DIAGNOSIS — I2510 Atherosclerotic heart disease of native coronary artery without angina pectoris: Secondary | ICD-10-CM | POA: Diagnosis not present

## 2016-10-12 DIAGNOSIS — E1051 Type 1 diabetes mellitus with diabetic peripheral angiopathy without gangrene: Secondary | ICD-10-CM | POA: Diagnosis not present

## 2016-10-12 DIAGNOSIS — Z5181 Encounter for therapeutic drug level monitoring: Secondary | ICD-10-CM

## 2016-10-12 DIAGNOSIS — I5022 Chronic systolic (congestive) heart failure: Secondary | ICD-10-CM | POA: Diagnosis not present

## 2016-10-12 DIAGNOSIS — I779 Disorder of arteries and arterioles, unspecified: Secondary | ICD-10-CM

## 2016-10-12 DIAGNOSIS — Z4781 Encounter for orthopedic aftercare following surgical amputation: Secondary | ICD-10-CM | POA: Diagnosis not present

## 2016-10-12 LAB — BASIC METABOLIC PANEL
BUN: 52 mg/dL — AB (ref 7–25)
CALCIUM: 9.4 mg/dL (ref 8.6–10.4)
CO2: 29 mmol/L (ref 20–31)
CREATININE: 1.52 mg/dL — AB (ref 0.60–0.93)
Chloride: 98 mmol/L (ref 98–110)
Glucose, Bld: 121 mg/dL — ABNORMAL HIGH (ref 65–99)
Potassium: 4.4 mmol/L (ref 3.5–5.3)
Sodium: 137 mmol/L (ref 135–146)

## 2016-10-12 LAB — FERRITIN: FERRITIN: 208 ng/mL (ref 20–288)

## 2016-10-12 LAB — CBC
HCT: 41 % (ref 35.0–45.0)
HEMOGLOBIN: 13 g/dL (ref 11.7–15.5)
MCH: 27.2 pg (ref 27.0–33.0)
MCHC: 31.7 g/dL — ABNORMAL LOW (ref 32.0–36.0)
MCV: 85.8 fL (ref 80.0–100.0)
MPV: 9.1 fL (ref 7.5–12.5)
Platelets: 199 10*3/uL (ref 140–400)
RBC: 4.78 MIL/uL (ref 3.80–5.10)
WBC: 5.6 10*3/uL (ref 3.8–10.8)

## 2016-10-12 LAB — BRAIN NATRIURETIC PEPTIDE: BRAIN NATRIURETIC PEPTIDE: 1200.9 pg/mL — AB (ref <100)

## 2016-10-12 NOTE — Progress Notes (Signed)
Cardiology Office Note Date:  10/12/2016  Patient ID:  Jasa, Dundon [birth year], MRN [id number] PCP:  Wendie Agreste, MD  Cardiologist:  Dr. Harrington Challenger   Chief Complaint: f/u on O2 needs  History of Present Illness: GLORIANA PILTZ is a 70 y.o. female with history of CAD (s/p CABG) , Aortic stenosis, DM, dyslipidemia, PVD with L foot transmetatarsal amputation earlier this year,  CKD, Iron/B12 deficiiency follows with heme/onc,  comes in to the office today to be seen for Dr. Harrington Challenger.  She was last seen by her earlier this month, in communication with her [facility] PT observed she desats with ambulation and has limited her at times,  rx home O2 was made by Dr. Harrington Challenger, though requires further assessment for coverage of this service and comes today for this purpose.  She is feeling well today, she woke feeling a little weak in general but feeling well now.  No CP, palpitations.  In general she reports steady improvement.  Her edema is much better and reports wounds nearly healed and almost done with the wound care clinic, physically with the help of her home PT she continues to feel stronger and is frustrated with the recent slow don in her PT regime because the low O2 sats.  She tells me this random that she can do PT that is quite rigorous for her and feel great with normal readings then has days where minimal exertion lowers the reading with associated feeling of DOE and they stop for the day.  She denies any nighttime symptoms of PND or orthopnea.  She denies any near syncope or syncope.  She does have remote hx of syncope that she says was w/u with Dr. Harrington Challenger and a neurologist, with pacing herself and not overexerting physically this has resolved, none in many months and in this time reports going from being essentially wheelchair bound to being able to walk up her stairs at home.  The patient has observed the biggest difference has been her iron infusions and B12 injections, since treatment has  started she feels this has made a marked improvement.   Today's O2 sat readings: On RA and at rest is 97% On RA while ambulating to her tolerance (secondary to LE limitations) is 96%, upon sitting it transiently dips to 88% but quickly recovers back to 96%   Prior cardiac testing: Echo Oct 2016 LVEF 25 to 30%, RVEF mod depressed; mod aortic stenosis. cath in 09/2011 showed patent LIMA to LAD with occlusion of distal LAD; High grade stenosis of nondominant RCA; Signifcant disease of prox LCx; SVG to L PDA occluded, SVG to OM patent  Past Medical History:  Diagnosis Date  . Anemia   . Anginal pain ([place])    "history; not currently" (08/28/2012)  . Aortic stenosis    0.8cm2 on echo in 10/2011  . Arthritis    "hands, hips, all over" (08/28/2012  . B12 deficiency anemia    B 12 injection "monthly since 05/2012" (08/28/2012)  . Bilateral carpal tunnel syndrome 1970s  . Blood transfusion    no reaction from transfusion; "when I had heart surgery" (08/28/2012)  . Breast cancer ([place])    left  . CAD (coronary artery disease)    s/p CABG x3 in 1993; last cath in 2010  . Cat bite 2009   with MRSA; required I & D  . Cellulitis   . Cellulitis June 2013, Nov. 2013 and Feb. 14, 2014   Left leg  . CHF (congestive  heart failure) ([place]) Aug. 2012   Echo 10/2011: Mild LVH, EF 30%, apex akinetic, septal HK, grade 2 diastolic dysfunction, or functionally bicuspid aortic valve, mild aortic stenosis by gradient, severe by calculated AVA-suspect A. Aortic stenosis probably moderate, trivial MR, mild LAE, mild RAE.   [name] Claudication ([place])   . Exertional dyspnea    "because of the heart failure" (08/28/2012)  . GERD (gastroesophageal reflux disease)   . Heart murmur   . History of bronchitis    "I've had it twice in my life" (08/28/2012)  . History of recurrent UTIs   . HTN (hypertension)   . Hyperlipidemia    on Lipitor  . Hypothyroidism   . Iron deficiency anemia 06/13/2012   "iron infusion"  (08/28/2012  . Myocardial infarction 1986   "silent"  . Osteoarthritis of both feet   . Osteomyelitis ([place])    left foot  . PVD (peripheral vascular disease) ([place])    Toes amputated from left foot and has had prior bypass on left leg. Followed by Dr. Donnetta Hutching  . Scleroderma ([place])   . Seizure ([place])    ? seizure like event in 2010; workup included stress testing which led to repeat cath.   . Stomach ulcer 1981?   "on Tagament for ~ 1 yr" in 63s  . Type 1 diabetes ([place]) 10/1949    Past Surgical History:  Procedure Laterality Date  . AMPUTATION Right 05/27/2013   Procedure: AMPUTATION DIGIT FIFTH TOE;  Surgeon: Rosetta Posner, MD;  Location: [facility] OR;  Service: Vascular;  Laterality: Right;  . AMPUTATION Left 03/21/2016   Procedure: Left Transmetatarsal Amputation;  Surgeon: Newt Minion, MD;  Location: [place];  Service: Orthopedics;  Laterality: Left;  . APPENDECTOMY  1991  . BREAST BIOPSY    . BREAST BIOPSY WITH SENTINEL LYMPH NODE BIOPSY AND NEEDLE LOCALIZATION  1984  . CARDIAC CATHETERIZATION  2010   LIMA to LAD patent yet with occluded distal LAD after graft insertion & retrograde is occluded. 3-4 septal perforators arise &are patent. SVG to a large marginal patent; SVG presumably to the distal dominant LCX is occluded, moderately high grade stenosis of the AV circumflex, but with distal stenoses involving a severely diseased marginal branch not amenable  to PCI  nor is inferior branch   . CARDIAC CATHETERIZATION  10/2011   "unsuccessful attempt of stenting" (08/28/2012)  . CARDIAC CATHETERIZATION  1992; 09/2011  . CARPAL TUNNEL RELEASE  ~ 1970's   bilaterally  . CATARACT EXTRACTION W/ INTRAOCULAR LENS  IMPLANT, BILATERAL  1990's  . CORONARY ARTERY BYPASS GRAFT  1992   LIMA to LAD, SVG to distal LCX & SVG to Marginal  . FEMORAL BYPASS     left leg "related to transmetatarsal amputation" (08/28/2012)  . INCISION AND DRAINAGE OF WOUND  ~ 1967   "infection in left foot" (08/28/2012)  .  INCISION AND DRAINAGE OF WOUND  2009   "3 ORs on my right hand after cat bite" (08/28/2012)  . LEFT AND RIGHT HEART CATHETERIZATION WITH CORONARY ANGIOGRAM N/A 09/17/2011   Procedure: LEFT AND RIGHT HEART CATHETERIZATION WITH CORONARY ANGIOGRAM;  Surgeon: Hillary Bow, MD;  Location: [facility] CATH LAB;  Service: Cardiovascular;  Laterality: N/A;  . LOWER EXTREMITY ANGIOGRAM N/A 05/25/2013   Procedure: LOWER EXTREMITY ANGIOGRAM POSS INTERVENTION;  Surgeon: Conrad Sanborn, MD;  Location: [facility] CATH LAB;  Service: Cardiovascular;  Laterality: N/A;  . MASTECTOMY  11/1982   Left Breast  . PERCUTANEOUS CORONARY STENT INTERVENTION (PCI-S)  N/A 11/15/2011   Procedure: PERCUTANEOUS CORONARY STENT INTERVENTION (PCI-S);  Surgeon: Hillary Bow, MD;  Location: [facility] CATH LAB;  Service: Cardiovascular;  Laterality: N/A;  . TONSILLECTOMY  1952   "?adenoids"   . TRANSMETATARSAL AMPUTATION  04/2009 - 10/2009   "4 ORs; left foot"  . TRIGGER FINGER RELEASE  1980's   right thumb  . TUBAL LIGATION  1984  . VITRECTOMY  1990's-2004   "both eyes; I've had total of 4"    Current Outpatient Prescriptions  Medication Sig Dispense Refill  . aspirin EC 325 MG EC tablet Take 325 mg by mouth daily.     [name] atorvastatin (LIPITOR) 40 MG tablet TAKE 1 TABLET BY MOUTH AT BEDTIME 30 tablet 11  . Cyanocobalamin 1000 MCG/ML KIT Inject 1,000 mcg into the skin every 8 (eight) weeks.     . docusate sodium (COLACE) 50 MG capsule Take 50 mg by mouth daily.    . hydrOXYzine (ATARAX/VISTARIL) 10 MG tablet Take 1 tablet (10 mg total) by mouth 3 (three) times daily as needed for itching or anxiety. 30 tablet 0  . insulin detemir (LEVEMIR) 100 UNIT/ML injection Inject 12-24 Units into the skin 2 (two) times daily. Inject 24 units subutaneously every morning (10am)  and 12 units at bedtime (10pm)    . insulin lispro (HUMALOG) 100 UNIT/ML injection Inject 1-12 Units into the skin 4 (four) times daily as needed for high blood sugar (sliding scale).       . isosorbide mononitrate (IMDUR) 30 MG 24 hr tablet Take 1 tablet (30 mg total) by mouth daily. 90 tablet 3  . levothyroxine (SYNTHROID, LEVOTHROID) 100 MCG tablet Take 100 mcg by mouth daily before breakfast.   5  . torsemide (DEMADEX) 20 MG tablet 2 tablets (45m) by mouth daily  in the morning. 180 tablet 0   No current facility-administered medications for this visit.     Allergies:   Cephalexin; Ciprofloxacin; Zyvox [linezolid]; Adhesive [tape]; Bactrim [sulfamethoxazole-trimethoprim]; Daptomycin; and Vancomycin   Social History:  The patient  reports that she has never smoked. She has never used smokeless tobacco. She reports that she does not drink alcohol or use drugs.   Family History:  The patient's family history includes Diabetes in her mother; Hypertension in her mother; Stroke in her father and mother.  ROS:  Please see the history of present illness.  All other systems are reviewed and otherwise negative.   PHYSICAL EXAM:  VS:  BP 132/62   Pulse 74 Comment: IRREGULAR  Ht 5' 3.5" (1.613 m)   Wt 156 lb (70.8 kg)   BMI 27.20 kg/m  BMI: Body mass index is 27.2 kg/m. Well nourished, well developed, in no acute distress  HEENT: normocephalic, atraumatic  Neck: no JVD, carotid bruits or masses Cardiac:  RRR; extrasystoles, no significant murmurs, no rubs, or gallops Lungs:  clear to auscultation bilaterally, no wheezing, rhonchi or rales  Abd: soft, nontender MS: no deformity or atrophy Ext: b/l LE are wrapped w/ace wraps, no obvious significant edema is appreciated Skin: warm and dry, no rash Neuro:  No gross deficits appreciated Psych: euthymic mood, full affect  EKG:  Done today and reviewed by myself is SR, PAC, appears similar to previous  Recent Labs: 03/23/2016: ALT 12 09/17/2016: Brain Natriuretic Peptide 749.5; BUN 37; Creat 1.56; Hemoglobin 12.3; Platelets 166; Potassium 4.3; Sodium 142  09/17/2016: Cholesterol 171; HDL 55; LDL Cholesterol 98; Total CHOL/HDL  Ratio 3.1; Triglycerides 92; VLDL 18   CrCl cannot be  calculated (Patient's most recent lab result is older than the maximum 21 days allowed.).   Wt Readings from Last 3 Encounters:  10/12/16 156 lb (70.8 kg)  09/17/16 154 lb 12.8 oz (70.2 kg)  08/23/16 161 lb 1.6 oz (73.1 kg)     Other studies reviewed: Additional studies/records reviewed today include: summarized above  ASSESSMENT AND PLAN:  1. CAD     Last cath as above     no clear anginal sounding symptoms     On ASA, statin, nitrate     C/w Dr. Harrington Challenger  2. Chronic CHF (combined)     EF 25-30% Her exam today does not suggest fluid OL, symptoms and drop in O2 sats are intermittent and somewhat random sounding Will defer to her primary cardiologist management who knows her very well, the patient reports being taken off metoprolol historically, she can not recall why, she carries dx of CKD as well      On demadex       3. Home O2 needs     Today's RA sats are OK     I will staff message Dr. Harrington Challenger and defer to her for further w/u of her home PT observations  4. Iron/B12 deficiency     Dr. Heath Lark  She is scheduled for BMET, CBC and BP with Dr. Harrington Challenger today, her heme-onc Dr. Alvy Bimler has requested if we could draw a ferritin level at the same time, we will forward the result to her.   Disposition: F/u with Dr. Harrington Challenger as previosly planned, sooner if needed.  Current medicines are reviewed at length with the patient today.  The patient did not have any concerns regarding medicines.  Haywood Lasso, PA-C 10/12/2016 11:35 AM     [facility] [address] [place] [address] [phone] (office)  [phone] (fax)

## 2016-10-12 NOTE — Patient Instructions (Addendum)
Medication Instructions:   Your physician recommends that you continue on your current medications as directed. Please refer to the Current Medication list given to you today.   If you need a refill on your cardiac medications before your next appointment, please call your pharmacy.    Labwork: BNP BMET AND CBC  AND FERRITIN    Testing/Procedures: NONE ORDERED  TODAY    Follow-Up:  AS  SCHEDULE WITH DR ROSS    Any Other Special Instructions Will Be Listed Below (If Applicable).

## 2016-10-16 ENCOUNTER — Telehealth: Payer: Self-pay | Admitting: Physician Assistant

## 2016-10-16 ENCOUNTER — Telehealth: Payer: Self-pay | Admitting: Internal Medicine

## 2016-10-16 ENCOUNTER — Telehealth: Payer: Self-pay | Admitting: *Deleted

## 2016-10-16 NOTE — Telephone Encounter (Signed)
-----   Message from Heath Lark, MD sent at 10/16/2016  7:44 AM EST ----- PLease let her know ferritin level is adequate Recommend recheck in 3 months ----- Message ----- From: Cathlean Cower, RN Sent: 10/12/2016   4:31 PM To: Heath Lark, MD

## 2016-10-16 NOTE — Telephone Encounter (Signed)
I spoke with the patient, she remains feeling well, states her breathing remains better then usual in fact and her weight has been stable without change.  Her RA O2 sats over the weekend remained 96-97% and no increase or notable swelling.  She was asked to continue monitoring her weight, any symptoms, and to notify the office if any.  No changes for now.    Tommye Standard, PA-C

## 2016-10-16 NOTE — Telephone Encounter (Signed)
Patient had Ferritin lab done on 10/12/16.

## 2016-10-16 NOTE — Telephone Encounter (Signed)
New message  Beth pt PT from [facility] call requesting to speak with RN. Beth states pt has reported to her, over the last couple of day the pt has been waking up from sleep with her oxygen level being in the high 70s to low 80s. Please call back to discuss

## 2016-10-16 NOTE — Telephone Encounter (Signed)
Spoke with patient- she states she spoke personally with Tommye Standard, PA minutes before call.  She states everything was addressed and understands to call if symptoms worsen.

## 2016-10-16 NOTE — Telephone Encounter (Signed)
On next lab draw draw ferritin and transferrin saturation

## 2016-10-16 NOTE — Telephone Encounter (Signed)
LM that Ferritin level is adequate. Recheck in 3 months

## 2016-10-16 NOTE — Telephone Encounter (Signed)
Called to discuss lab result, no answer, I will call back later in the morning.  BNP is elevated though at her visit she reported ongoing improvement in her overall health including improving edema and improviong exertional capacity, no symptoms of PND or orthopnea were reported.  O2 sats in the office were good with ambulation as well.    Tommye Standard, PA-C

## 2016-10-17 ENCOUNTER — Ambulatory Visit (HOSPITAL_BASED_OUTPATIENT_CLINIC_OR_DEPARTMENT_OTHER): Payer: Medicare Other

## 2016-10-17 VITALS — BP 131/55 | HR 75 | Temp 97.5°F | Resp 20

## 2016-10-17 DIAGNOSIS — D518 Other vitamin B12 deficiency anemias: Secondary | ICD-10-CM

## 2016-10-17 MED ORDER — CYANOCOBALAMIN 1000 MCG/ML IJ SOLN
1000.0000 ug | Freq: Once | INTRAMUSCULAR | Status: AC
  Administered 2016-10-17: 1000 ug via INTRAMUSCULAR

## 2016-10-17 NOTE — Patient Instructions (Signed)

## 2016-10-18 ENCOUNTER — Encounter: Payer: Self-pay | Admitting: Family Medicine

## 2016-10-18 ENCOUNTER — Ambulatory Visit (INDEPENDENT_AMBULATORY_CARE_PROVIDER_SITE_OTHER): Payer: Medicare Other | Admitting: Family Medicine

## 2016-10-18 VITALS — BP 116/63 | HR 85 | Temp 98.7°F | Resp 18 | Ht 63.5 in | Wt 154.0 lb

## 2016-10-18 DIAGNOSIS — N189 Chronic kidney disease, unspecified: Secondary | ICD-10-CM | POA: Diagnosis not present

## 2016-10-18 DIAGNOSIS — Z4781 Encounter for orthopedic aftercare following surgical amputation: Secondary | ICD-10-CM | POA: Diagnosis not present

## 2016-10-18 DIAGNOSIS — J069 Acute upper respiratory infection, unspecified: Secondary | ICD-10-CM

## 2016-10-18 DIAGNOSIS — I739 Peripheral vascular disease, unspecified: Secondary | ICD-10-CM

## 2016-10-18 DIAGNOSIS — I110 Hypertensive heart disease with heart failure: Secondary | ICD-10-CM | POA: Diagnosis not present

## 2016-10-18 DIAGNOSIS — M349 Systemic sclerosis, unspecified: Secondary | ICD-10-CM | POA: Diagnosis not present

## 2016-10-18 DIAGNOSIS — E1022 Type 1 diabetes mellitus with diabetic chronic kidney disease: Secondary | ICD-10-CM

## 2016-10-18 DIAGNOSIS — I5022 Chronic systolic (congestive) heart failure: Secondary | ICD-10-CM | POA: Diagnosis not present

## 2016-10-18 DIAGNOSIS — I5042 Chronic combined systolic (congestive) and diastolic (congestive) heart failure: Secondary | ICD-10-CM | POA: Diagnosis not present

## 2016-10-18 DIAGNOSIS — E1051 Type 1 diabetes mellitus with diabetic peripheral angiopathy without gangrene: Secondary | ICD-10-CM | POA: Diagnosis not present

## 2016-10-18 DIAGNOSIS — I2510 Atherosclerotic heart disease of native coronary artery without angina pectoris: Secondary | ICD-10-CM | POA: Diagnosis not present

## 2016-10-18 NOTE — Progress Notes (Signed)
By signing my name below I, Tereasa Coop, attest that this documentation has been prepared under the direction and in the presence of Wendie Agreste, MD. Electonically Signed. Tereasa Coop, Scribe 10/18/2016 at 5:28 PM  Subjective:    Patient ID: [name], female    DOB: [birth year], 71 y.o.   MRN: [id number]  Chief Complaint  Patient presents with  . establish care    has lots of specalist providers but no pcp  . other    foot exam was not done; patient has compression stockings all the way to toes; she states she gets foot care elsewhere  . Medication Refill    Atorvastatin    HPI [name] is a 71 y.o. female who presents to the [facility] to establish care with Dr Carlota Raspberry.  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  Pt also reports that she was starting to have a mildly runny nose, mild subjective fevers, and mild fatigue that started 2 days ago and started to improve. Pt states she measured her temperature and was afebrile. Also reports rare productive cough in the morning. Pt states she started taking mucinex and symptoms have started to improve. Denies N/V. States she is able to sleep well at night.   CAD status post CABG/Aortic stenosis/PVD with foot amputation early 2017.  Pt is followed by cardiologist Dr Harrington Challenger. Was last seen by cardiology in Dec 2017. Pt also has Chronic lower extremity edema and wears compression stockings. Pt is on aspirin, nitrate, and a statin . Pt also has systolic and diastolic heart failure. Most recent EF was 25-30. Pt did not appear to be fluid overloaded during Dec visit. Pt takes Demadex.  Anemia Pt is followed by Dr Alvy Bimler; Hematology. Last seen 08/27/16. Pt has an iron and vit B deficiency. Treated with IV iron and B12 replacement every 3 months. Last iron  infusion was 08/31/16. Pt reports that she felt much better for the first week after the infusion then started dropping her O2 sat with exertion as she was prior to the infusion. Hemoglobin in the past 3 months has ranged from 11.9-13.  Note that pt reports she had a chronic cough that resolved after her last iron infusion.   Chronic kidney disease Lab Results  Component Value Date   CREATININE 1.52 (H) 10/12/2016  Pt ranges from 1.5-1.8 over the past 6 months. Followed by Dr Justin Mend; nephrologist.    HLD Lab Results  Component Value Date   CHOL 171 09/17/2016   HDL 55 09/17/2016   LDLCALC 98 09/17/2016   LDLDIRECT 132.7 04/22/2007   TRIG 92 09/17/2016   CHOLHDL 3.1 09/17/2016   Lab Results  Component Value Date   ALT 12 (L) 03/23/2016   AST 18 03/23/2016   ALKPHOS 95 03/23/2016   BILITOT 0.9 03/23/2016  Pt takes 7m lipitor QD. States BP medications typically prescribed by her cardiologist.   DM Type I Pt is followed by endocrinologist Dr BChalmers Cater.  Dr Early is pt's vascular surgeon.    Patient Active Problem List   Diagnosis Date Noted  . Microcytosis 07/05/2016  . PAD (peripheral artery disease) ([place] 03/23/2016  . Status post transmetatarsal amputation of left foot ([place] 03/21/2016  . Pain in limb 03/02/2014  . Skin tear 11/17/2013  . PVD (peripheral vascular disease) ([place] 09/22/2013  . Aftercare  following surgery of the circulatory system, NEC 06/09/2013  . Atherosclerosis of native arteries of the extremities with ulceration(440.23) 06/09/2013  . Diabetic osteomyelitis ([place]) 06/02/2013  . Diabetic midfoot ulcer in type 1 diabetes mellitus ([place]) 05/22/2013  . Abnormality of gait 01/21/2013  . CKD (chronic kidney disease), stage III 11/29/2012  . Chronic combined systolic and diastolic heart failure ([place]) 04/24/2012  . Health care maintenance 04/24/2012  . Iron deficiency anemia 04/15/2012  . Vitamin B12 deficiency (dietary) anemia 04/15/2012  . Aortic stenosis  08/22/2011  . ACQUIRED HEMOLYTIC ANEMIA UNSPECIFIED 06/06/2009  . CAD 02/03/2009  . CAROTID ARTERY STENOSIS, WITHOUT INFARCTION 02/03/2009  . Peripheral artery disease 02/03/2009  . Unspecified hypothyroidism 02/01/2009  . Dyslipidemia 02/01/2009  . Essential hypertension 02/01/2009  . DM (diabetes mellitus) type I uncontrolled with renal manifestation ([place]) 10[birth year]15/1949   Past Medical History:  Diagnosis Date  . Anemia   . Anginal pain ([place])    "history; not currently" (08/28/2012)  . Aortic stenosis    0.8cm2 on echo in 10/2011  . Arthritis    "hands, hips, all over" (08/28/2012  . B12 deficiency anemia    B 12 injection "monthly since 05/2012" (08/28/2012)  . Bilateral carpal tunnel syndrome 1970s  . Blood transfusion    no reaction from transfusion; "when I had heart surgery" (08/28/2012)  . Breast cancer ([place])    left  . CAD (coronary artery disease)    s/p CABG x3 in 1993; last cath in 2010  . Cat bite 2009   with MRSA; required I & D  . Cellulitis   . Cellulitis June 2013, Nov. 2013 and Feb. 14, 2014   Left leg  . CHF (congestive heart failure) ([place]) Aug. 2012   Echo 10/2011: Mild LVH, EF 30%, apex akinetic, septal HK, grade 2 diastolic dysfunction, or functionally bicuspid aortic valve, mild aortic stenosis by gradient, severe by calculated AVA-suspect A. Aortic stenosis probably moderate, trivial MR, mild LAE, mild RAE.   [name] Claudication ([place])   . Exertional dyspnea    "because of the heart failure" (08/28/2012)  . GERD (gastroesophageal reflux disease)   . Heart murmur   . History of bronchitis    "I've had it twice in my life" (08/28/2012)  . History of recurrent UTIs   . HTN (hypertension)   . Hyperlipidemia    on Lipitor  . Hypothyroidism   . Iron deficiency anemia 06/13/2012   "iron infusion" (08/28/2012  . Myocardial infarction 1986   "silent"  . Osteoarthritis of both feet   . Osteomyelitis ([place])    left foot  . PVD (peripheral vascular disease) ([place])      Toes amputated from left foot and has had prior bypass on left leg. Followed by Dr. Donnetta Hutching  . Scleroderma ([place])   . Seizure ([place])    ? seizure like event in 2010; workup included stress testing which led to repeat cath.   . Stomach ulcer 1981?   "on Tagament for ~ 1 yr" in 3s  . Type 1 diabetes ([place]) 10/1949   Past Surgical History:  Procedure Laterality Date  . AMPUTATION Right 05/27/2013   Procedure: AMPUTATION DIGIT FIFTH TOE;  Surgeon: Rosetta Posner, MD;  Location: [facility] OR;  Service: Vascular;  Laterality: Right;  . AMPUTATION Left 03/21/2016   Procedure: Left Transmetatarsal Amputation;  Surgeon: Newt Minion, MD;  Location: [place];  Service: Orthopedics;  Laterality: Left;  . APPENDECTOMY  1991  . BREAST BIOPSY    . BREAST  BIOPSY WITH SENTINEL LYMPH NODE BIOPSY AND NEEDLE LOCALIZATION  1984  . CARDIAC CATHETERIZATION  2010   LIMA to LAD patent yet with occluded distal LAD after graft insertion & retrograde is occluded. 3-4 septal perforators arise &are patent. SVG to a large marginal patent; SVG presumably to the distal dominant LCX is occluded, moderately high grade stenosis of the AV circumflex, but with distal stenoses involving a severely diseased marginal branch not amenable  to PCI  nor is inferior branch   . CARDIAC CATHETERIZATION  10/2011   "unsuccessful attempt of stenting" (08/28/2012)  . CARDIAC CATHETERIZATION  1992; 09/2011  . CARPAL TUNNEL RELEASE  ~ 1970's   bilaterally  . CATARACT EXTRACTION W/ INTRAOCULAR LENS  IMPLANT, BILATERAL  1990's  . CORONARY ARTERY BYPASS GRAFT  1992   LIMA to LAD, SVG to distal LCX & SVG to Marginal  . FEMORAL BYPASS     left leg "related to transmetatarsal amputation" (08/28/2012)  . INCISION AND DRAINAGE OF WOUND  ~ 1967   "infection in left foot" (08/28/2012)  . INCISION AND DRAINAGE OF WOUND  2009   "3 ORs on my right hand after cat bite" (08/28/2012)  . LEFT AND RIGHT HEART CATHETERIZATION WITH CORONARY ANGIOGRAM N/A 09/17/2011    Procedure: LEFT AND RIGHT HEART CATHETERIZATION WITH CORONARY ANGIOGRAM;  Surgeon: Hillary Bow, MD;  Location[facility] C[facility] LAB;  Service: Cardiovascular;  Laterality: N/A;  . LOWER EXTREMITY ANGIOGRAM N/A 05/25/2013   Procedure: LOWER EXTREMITY ANGIOGRAM POSS INTERVENTION;  Surgeon: Conrad Enterprise, MD;  Location: [facility] CATH LAB;  Service: Cardiovascular;  Laterality: N/A;  . MASTECTOMY  11/1982   Left Breast  . PERCUTANEOUS CORONARY STENT INTERVENTION (PCI-S) N/A 11/15/2011   Procedure: PERCUTANEOUS CORONARY STENT INTERVENTION (PCI-S);  Surgeon: Hillary Bow, MD;  Location: [facility] CATH LAB;  Service: Cardiovascular;  Laterality: N/A;  . TONSILLECTOMY  1952   "?adenoids"   . TRANSMETATARSAL AMPUTATION  04/2009 - 10/2009   "4 ORs; left foot"  . TRIGGER FINGER RELEASE  1980's   right thumb  . TUBAL LIGATION  1984  . VITRECTOMY  1990's-2004   "both eyes; I've had total of 4"   Allergies  Allergen Reactions  . Cephalexin Swelling and Rash    No specific area  . Ciprofloxacin Swelling and Rash    No specific area  . Zyvox [Linezolid] Other (See Comments)    Thrombocytopenia developed to 50k after several weeks of therapy  . Adhesive [Tape] Other (See Comments)    "old [employer] adhesive tape; takes my skin off; can use paper tape"  . Bactrim [Sulfamethoxazole-Trimethoprim] Other (See Comments)    Hyperkalemia and Increased creatinine  . Daptomycin Other (See Comments)    Had elevated CPK on therapy, not clear that this was due to cubicin  . Vancomycin Other (See Comments)    ? If this played role in her Acute kidney injury   Prior to Admission medications   Medication Sig Start Date End Date Taking? Authorizing Provider  aspirin EC 325 MG EC tablet Take 325 mg by mouth daily.    Yes Historical Provider, MD  atorvastatin (LIPITOR) 40 MG tablet TAKE 1 TABLET BY MOUTH AT BEDTIME 08/04/15  Yes Brittainy Erie Noe, PA-C  Cyanocobalamin 1000 MCG/ML KIT Inject 1,000 mcg into the skin every 8  (eight) weeks.    Yes Historical Provider, MD  docusate sodium (COLACE) 50 MG capsule Take 50 mg by mouth daily.   Yes Historical Provider, MD  hydrOXYzine (ATARAX/VISTARIL)  10 MG tablet Take 1 tablet (10 mg total) by mouth 3 (three) times daily as needed for itching or anxiety. 03/23/16  Yes Ivan Anchors Love, PA-C  insulin detemir (LEVEMIR) 100 UNIT/ML injection Inject 12-24 Units into the skin 2 (two) times daily. Inject 24 units subutaneously every morning (10am)  and 12 units at bedtime (10pm)   Yes Historical Provider, MD  insulin lispro (HUMALOG) 100 UNIT/ML injection Inject 1-12 Units into the skin 4 (four) times daily as needed for high blood sugar (sliding scale).    Yes Historical Provider, MD  isosorbide mononitrate (IMDUR) 30 MG 24 hr tablet Take 1 tablet (30 mg total) by mouth daily. 06/06/16  Yes Fay Records, MD  levothyroxine (SYNTHROID, LEVOTHROID) 100 MCG tablet Take 100 mcg by mouth daily before breakfast.  05/19/15  Yes Historical Provider, MD  torsemide (DEMADEX) 20 MG tablet 2 tablets (47m) by mouth daily  in the morning. 08/06/16  Yes PFay Records MD   Social History   Social History  . Marital status: Single    Spouse name: N/A  . Number of children: N/A  . Years of education: N/A   Occupational History  . AWeb designer   Social History Main Topics  . Smoking status: Never Smoker  . Smokeless tobacco: Never Used  . Alcohol use No  . Drug use: No  . Sexual activity: Not Currently    Birth control/ protection: Post-menopausal   Other Topics Concern  . Not on file   Social History Narrative  . No narrative on file      Review of Systems  Constitutional: Positive for fatigue (mild). Negative for fever.  HENT: Positive for congestion and rhinorrhea.   Respiratory: Positive for cough.   Gastrointestinal: Negative for nausea and vomiting.  Psychiatric/Behavioral: Negative for sleep disturbance.       Objective:   Physical Exam  Constitutional:  She is oriented to person, place, and time. She appears well-developed and well-nourished. No distress.  HENT:  Head: Normocephalic and atraumatic.  Right Ear: Hearing, tympanic membrane, external ear and ear canal normal.  Left Ear: Hearing, tympanic membrane, external ear and ear canal normal.  Nose: Nose normal.  Mouth/Throat: Oropharynx is clear and moist. No oropharyngeal exudate.  Eyes: Conjunctivae and EOM are normal. Pupils are equal, round, and reactive to light.  Cardiovascular: Normal rate, regular rhythm, normal heart sounds and intact distal pulses.   No murmur heard. Pulmonary/Chest: Effort normal and breath sounds normal. No respiratory distress. She has no wheezes. She has no rhonchi.  Neurological: She is alert and oriented to person, place, and time.  Skin: Skin is warm and dry. No rash noted.  Psychiatric: She has a normal mood and affect. Her behavior is normal.  Vitals reviewed.   Vitals:   10/18/16 1614  BP: 116/63  Pulse: 85  Resp: 18  Temp: 98.7 F (37.1 C)  TempSrc: Oral  SpO2: 94%  Weight: 154 lb (69.9 kg)  Height: 5' 3.5" (1.613 m)         Assessment & Plan:    [name] SHIFFMANis a 71y.o. female Acute upper respiratory infection  - Reassuring exam. Suspected viral illness. Symptomatic care discussed as well as RTC/ER precautions.  Type 1 diabetes mellitus with diabetic chronic kidney disease, unspecified CKD stage ([place], also with peripheral vascular disease.  - Followed by endocrinology. Continue statin, routine follow-up with ophthalmology, dentist, cardiology.  Chronic combined systolic and diastolic heart failure (HCC) PVD (peripheral  vascular disease) ([place])  - Continue routine follow-up with cardiology. Does not appear to be fluid overloaded in office. Statin was discussed, but there was talk of change to different medication. She deferred to discuss this with cardiologist prior to refills.  Health maintenance  - Do for pneumonia  vaccines as well as flu vaccine. Recommended those today. Declined and plans on returning after current illness for flu vaccination as well as scheduling Medicare wellness exam for other health maintenance assessment.  No orders of the defined types were placed in this encounter.  Patient Instructions   Call or Mychart message Dr. Harrington Challenger about your cholesterol medicine if there was a possibility of changing your medicine.   You may have a upper respiratory infection. Saline nasal spray if needed for nasal congestion. Over the counter mucinex or mucinex DM if needed for cough. Drink plenty of fluids, rest. Return to the clinic or go to the nearest emergency room if any of your symptoms worsen or new symptoms occur.  Continue follow up with cardiology and your other specialists.   Plan for medicare wellness exam in next 6 months.   As soon as your current symptoms improve, either return here or other location for flu shot. You're also overdue for pneumonia vaccines, and can give you the first of 2 vaccines at your next visit which would be Prevnar.   Upper Respiratory Infection, Adult Most upper respiratory infections (URIs) are a viral infection of the air passages leading to the lungs. A URI affects the nose, throat, and upper air passages. The most common type of URI is nasopharyngitis and is typically referred to as "the common cold." URIs run their course and usually go away on their own. Most of the time, a URI does not require medical attention, but sometimes a bacterial infection in the upper airways can follow a viral infection. This is called a secondary infection. Sinus and middle ear infections are common types of secondary upper respiratory infections. Bacterial pneumonia can also complicate a URI. A URI can worsen asthma and chronic obstructive pulmonary disease (COPD). Sometimes, these complications can require emergency medical care and may be life threatening. What are the  causes? Almost all URIs are caused by viruses. A virus is a type of germ and can spread from one person to another. What increases the risk? You may be at risk for a URI if:  You smoke.  You have chronic heart or lung disease.  You have a weakened defense (immune) system.  You are very young or very old.  You have nasal allergies or asthma.  You work in crowded or poorly ventilated areas.  You work in health care facilities or schools. What are the signs or symptoms? Symptoms typically develop 2-3 days after you come in contact with a cold virus. Most viral URIs last 7-10 days. However, viral URIs from the influenza virus (flu virus) can last 14-18 days and are typically more severe. Symptoms may include:  Runny or stuffy (congested) nose.  Sneezing.  Cough.  Sore throat.  Headache.  Fatigue.  Fever.  Loss of appetite.  Pain in your forehead, behind your eyes, and over your cheekbones (sinus pain).  Muscle aches. How is this diagnosed? Your health care provider may diagnose a URI by:  Physical exam.  Tests to check that your symptoms are not due to another condition such as:  Strep throat.  Sinusitis.  Pneumonia.  Asthma. How is this treated? A URI goes away on its own  with time. It cannot be cured with medicines, but medicines may be prescribed or recommended to relieve symptoms. Medicines may help:  Reduce your fever.  Reduce your cough.  Relieve nasal congestion. Follow these instructions at home:  Take medicines only as directed by your health care provider.  Gargle warm saltwater or take cough drops to comfort your throat as directed by your health care provider.  Use a warm mist humidifier or inhale steam from a shower to increase air moisture. This may make it easier to breathe.  Drink enough fluid to keep your urine clear or pale yellow.  Eat soups and other clear broths and maintain good nutrition.  Rest as needed.  Return to work  when your temperature has returned to normal or as your health care provider advises. You may need to stay home longer to avoid infecting others. You can also use a face mask and careful hand washing to prevent spread of the virus.  Increase the usage of your inhaler if you have asthma.  Do not use any tobacco products, including cigarettes, chewing tobacco, or electronic cigarettes. If you need help quitting, ask your health care provider. How is this prevented? The best way to protect yourself from getting a cold is to practice good hygiene.  Avoid oral or hand contact with people with cold symptoms.  Wash your hands often if contact occurs. There is no clear evidence that vitamin C, vitamin E, echinacea, or exercise reduces the chance of developing a cold. However, it is always recommended to get plenty of rest, exercise, and practice good nutrition. Contact a health care provider if:  You are getting worse rather than better.  Your symptoms are not controlled by medicine.  You have chills.  You have worsening shortness of breath.  You have brown or red mucus.  You have yellow or brown nasal discharge.  You have pain in your face, especially when you bend forward.  You have a fever.  You have swollen neck glands.  You have pain while swallowing.  You have white areas in the back of your throat. Get help right away if:  You have severe or persistent:  Headache.  Ear pain.  Sinus pain.  Chest pain.  You have chronic lung disease and any of the following:  Wheezing.  Prolonged cough.  Coughing up blood.  A change in your usual mucus.  You have a stiff neck.  You have changes in your:  Vision.  Hearing.  Thinking.  Mood. This information is not intended to replace advice given to you by your health care provider. Make sure you discuss any questions you have with your health care provider. Document Released: 03/27/2001 Document Revised: 06/03/2016  Document Reviewed: 01/06/2014 Elsevier Interactive Patient Education  2017 [employer].   IF you received an x-ray today, you will receive an invoice from [facility]. Please contact [facility] at [phone] with questions or concerns regarding your invoice.   IF you received labwork today, you will receive an invoice from [place]. Please contact [facility] at [phone] with questions or concerns regarding your invoice.   Our billing staff will not be able to assist you with questions regarding bills from these companies.  You will be contacted with the lab results as soon as they are available. The fastest way to get your results is to activate your My Chart account. Instructions are located on the last page of this paperwork. If you have not heard from [place] regarding the results  in 2 weeks, please contact this office.        I personally performed the services described in this documentation, which was scribed in my presence. The recorded information has been reviewed and considered, and addended by me as needed.   Signed,   Merri Ray, MD [facility].  10/21/16 2:23 PM

## 2016-10-18 NOTE — Patient Instructions (Addendum)
Call or Mychart message Dr. Harrington Challenger about your cholesterol medicine if there was a possibility of changing your medicine.   You may have a upper respiratory infection. Saline nasal spray if needed for nasal congestion. Over the counter mucinex or mucinex DM if needed for cough. Drink plenty of fluids, rest. Return to the clinic or go to the nearest emergency room if any of your symptoms worsen or new symptoms occur.  Continue follow up with cardiology and your other specialists.   Plan for medicare wellness exam in next 6 months.   As soon as your current symptoms improve, either return here or other location for flu shot. You're also overdue for pneumonia vaccines, and can give you the first of 2 vaccines at your next visit which would be Prevnar.   Upper Respiratory Infection, Adult Most upper respiratory infections (URIs) are a viral infection of the air passages leading to the lungs. A URI affects the nose, throat, and upper air passages. The most common type of URI is nasopharyngitis and is typically referred to as "the common cold." URIs run their course and usually go away on their own. Most of the time, a URI does not require medical attention, but sometimes a bacterial infection in the upper airways can follow a viral infection. This is called a secondary infection. Sinus and middle ear infections are common types of secondary upper respiratory infections. Bacterial pneumonia can also complicate a URI. A URI can worsen asthma and chronic obstructive pulmonary disease (COPD). Sometimes, these complications can require emergency medical care and may be life threatening. What are the causes? Almost all URIs are caused by viruses. A virus is a type of germ and can spread from one person to another. What increases the risk? You may be at risk for a URI if:  You smoke.  You have chronic heart or lung disease.  You have a weakened defense (immune) system.  You are very young or very  old.  You have nasal allergies or asthma.  You work in crowded or poorly ventilated areas.  You work in health care facilities or schools. What are the signs or symptoms? Symptoms typically develop 2-3 days after you come in contact with a cold virus. Most viral URIs last 7-10 days. However, viral URIs from the influenza virus (flu virus) can last 14-18 days and are typically more severe. Symptoms may include:  Runny or stuffy (congested) nose.  Sneezing.  Cough.  Sore throat.  Headache.  Fatigue.  Fever.  Loss of appetite.  Pain in your forehead, behind your eyes, and over your cheekbones (sinus pain).  Muscle aches. How is this diagnosed? Your health care provider may diagnose a URI by:  Physical exam.  Tests to check that your symptoms are not due to another condition such as:  Strep throat.  Sinusitis.  Pneumonia.  Asthma. How is this treated? A URI goes away on its own with time. It cannot be cured with medicines, but medicines may be prescribed or recommended to relieve symptoms. Medicines may help:  Reduce your fever.  Reduce your cough.  Relieve nasal congestion. Follow these instructions at home:  Take medicines only as directed by your health care provider.  Gargle warm saltwater or take cough drops to comfort your throat as directed by your health care provider.  Use a warm mist humidifier or inhale steam from a shower to increase air moisture. This may make it easier to breathe.  Drink enough fluid to keep your urine  clear or pale yellow.  Eat soups and other clear broths and maintain good nutrition.  Rest as needed.  Return to work when your temperature has returned to normal or as your health care provider advises. You may need to stay home longer to avoid infecting others. You can also use a face mask and careful hand washing to prevent spread of the virus.  Increase the usage of your inhaler if you have asthma.  Do not use any  tobacco products, including cigarettes, chewing tobacco, or electronic cigarettes. If you need help quitting, ask your health care provider. How is this prevented? The best way to protect yourself from getting a cold is to practice good hygiene.  Avoid oral or hand contact with people with cold symptoms.  Wash your hands often if contact occurs. There is no clear evidence that vitamin C, vitamin E, echinacea, or exercise reduces the chance of developing a cold. However, it is always recommended to get plenty of rest, exercise, and practice good nutrition. Contact a health care provider if:  You are getting worse rather than better.  Your symptoms are not controlled by medicine.  You have chills.  You have worsening shortness of breath.  You have brown or red mucus.  You have yellow or brown nasal discharge.  You have pain in your face, especially when you bend forward.  You have a fever.  You have swollen neck glands.  You have pain while swallowing.  You have white areas in the back of your throat. Get help right away if:  You have severe or persistent:  Headache.  Ear pain.  Sinus pain.  Chest pain.  You have chronic lung disease and any of the following:  Wheezing.  Prolonged cough.  Coughing up blood.  A change in your usual mucus.  You have a stiff neck.  You have changes in your:  Vision.  Hearing.  Thinking.  Mood. This information is not intended to replace advice given to you by your health care provider. Make sure you discuss any questions you have with your health care provider. Document Released: 03/27/2001 Document Revised: 06/03/2016 Document Reviewed: 01/06/2014 Elsevier Interactive Patient Education  2017 [employer].   IF you received an x-ray today, you will receive an invoice from [facility]. Please contact [facility] at [phone] with questions or concerns regarding your invoice.   IF you received  labwork today, you will receive an invoice from [facility]. Please contact [facility] at [phone] with questions or concerns regarding your invoice.   Our billing staff will not be able to assist you with questions regarding bills from these companies.  You will be contacted with the lab results as soon as they are available. The fastest way to get your results is to activate your My Chart account. Instructions are located on the last page of this paperwork. If you have not heard from [place] regarding the results in 2 weeks, please contact this office.

## 2016-10-19 DIAGNOSIS — M349 Systemic sclerosis, unspecified: Secondary | ICD-10-CM | POA: Diagnosis not present

## 2016-10-19 DIAGNOSIS — I110 Hypertensive heart disease with heart failure: Secondary | ICD-10-CM | POA: Diagnosis not present

## 2016-10-19 DIAGNOSIS — I5022 Chronic systolic (congestive) heart failure: Secondary | ICD-10-CM | POA: Diagnosis not present

## 2016-10-19 DIAGNOSIS — Z4781 Encounter for orthopedic aftercare following surgical amputation: Secondary | ICD-10-CM | POA: Diagnosis not present

## 2016-10-19 DIAGNOSIS — E1051 Type 1 diabetes mellitus with diabetic peripheral angiopathy without gangrene: Secondary | ICD-10-CM | POA: Diagnosis not present

## 2016-10-19 DIAGNOSIS — I2510 Atherosclerotic heart disease of native coronary artery without angina pectoris: Secondary | ICD-10-CM | POA: Diagnosis not present

## 2016-10-22 ENCOUNTER — Other Ambulatory Visit: Payer: Self-pay | Admitting: Internal Medicine

## 2016-10-23 DIAGNOSIS — Z4781 Encounter for orthopedic aftercare following surgical amputation: Secondary | ICD-10-CM | POA: Diagnosis not present

## 2016-10-23 DIAGNOSIS — I5022 Chronic systolic (congestive) heart failure: Secondary | ICD-10-CM | POA: Diagnosis not present

## 2016-10-23 DIAGNOSIS — I110 Hypertensive heart disease with heart failure: Secondary | ICD-10-CM | POA: Diagnosis not present

## 2016-10-23 DIAGNOSIS — I2510 Atherosclerotic heart disease of native coronary artery without angina pectoris: Secondary | ICD-10-CM | POA: Diagnosis not present

## 2016-10-23 DIAGNOSIS — M349 Systemic sclerosis, unspecified: Secondary | ICD-10-CM | POA: Diagnosis not present

## 2016-10-23 DIAGNOSIS — E1051 Type 1 diabetes mellitus with diabetic peripheral angiopathy without gangrene: Secondary | ICD-10-CM | POA: Diagnosis not present

## 2016-10-25 DIAGNOSIS — E1051 Type 1 diabetes mellitus with diabetic peripheral angiopathy without gangrene: Secondary | ICD-10-CM | POA: Diagnosis not present

## 2016-10-25 DIAGNOSIS — M349 Systemic sclerosis, unspecified: Secondary | ICD-10-CM | POA: Diagnosis not present

## 2016-10-25 DIAGNOSIS — I5022 Chronic systolic (congestive) heart failure: Secondary | ICD-10-CM | POA: Diagnosis not present

## 2016-10-25 DIAGNOSIS — I110 Hypertensive heart disease with heart failure: Secondary | ICD-10-CM | POA: Diagnosis not present

## 2016-10-25 DIAGNOSIS — I2510 Atherosclerotic heart disease of native coronary artery without angina pectoris: Secondary | ICD-10-CM | POA: Diagnosis not present

## 2016-10-25 DIAGNOSIS — Z4781 Encounter for orthopedic aftercare following surgical amputation: Secondary | ICD-10-CM | POA: Diagnosis not present

## 2016-11-01 DIAGNOSIS — I2510 Atherosclerotic heart disease of native coronary artery without angina pectoris: Secondary | ICD-10-CM | POA: Diagnosis not present

## 2016-11-01 DIAGNOSIS — I5022 Chronic systolic (congestive) heart failure: Secondary | ICD-10-CM | POA: Diagnosis not present

## 2016-11-01 DIAGNOSIS — M349 Systemic sclerosis, unspecified: Secondary | ICD-10-CM | POA: Diagnosis not present

## 2016-11-01 DIAGNOSIS — I110 Hypertensive heart disease with heart failure: Secondary | ICD-10-CM | POA: Diagnosis not present

## 2016-11-01 DIAGNOSIS — E1051 Type 1 diabetes mellitus with diabetic peripheral angiopathy without gangrene: Secondary | ICD-10-CM | POA: Diagnosis not present

## 2016-11-01 DIAGNOSIS — Z4781 Encounter for orthopedic aftercare following surgical amputation: Secondary | ICD-10-CM | POA: Diagnosis not present

## 2016-11-02 ENCOUNTER — Other Ambulatory Visit: Payer: Self-pay | Admitting: Internal Medicine

## 2016-11-02 DIAGNOSIS — I5042 Chronic combined systolic (congestive) and diastolic (congestive) heart failure: Secondary | ICD-10-CM

## 2016-11-02 DIAGNOSIS — I739 Peripheral vascular disease, unspecified: Secondary | ICD-10-CM

## 2016-11-07 DIAGNOSIS — M349 Systemic sclerosis, unspecified: Secondary | ICD-10-CM | POA: Diagnosis not present

## 2016-11-07 DIAGNOSIS — Z4781 Encounter for orthopedic aftercare following surgical amputation: Secondary | ICD-10-CM | POA: Diagnosis not present

## 2016-11-07 DIAGNOSIS — I5022 Chronic systolic (congestive) heart failure: Secondary | ICD-10-CM | POA: Diagnosis not present

## 2016-11-07 DIAGNOSIS — I2510 Atherosclerotic heart disease of native coronary artery without angina pectoris: Secondary | ICD-10-CM | POA: Diagnosis not present

## 2016-11-07 DIAGNOSIS — E1051 Type 1 diabetes mellitus with diabetic peripheral angiopathy without gangrene: Secondary | ICD-10-CM | POA: Diagnosis not present

## 2016-11-07 DIAGNOSIS — I110 Hypertensive heart disease with heart failure: Secondary | ICD-10-CM | POA: Diagnosis not present

## 2016-11-15 ENCOUNTER — Other Ambulatory Visit: Payer: Medicare Other | Admitting: *Deleted

## 2016-11-15 DIAGNOSIS — I5042 Chronic combined systolic (congestive) and diastolic (congestive) heart failure: Secondary | ICD-10-CM

## 2016-11-15 NOTE — Addendum Note (Signed)
Addended by: Eulis Foster on: 11/15/2016 03:43 PM   Modules accepted: Orders

## 2016-11-16 DIAGNOSIS — M349 Systemic sclerosis, unspecified: Secondary | ICD-10-CM | POA: Diagnosis not present

## 2016-11-16 DIAGNOSIS — I2510 Atherosclerotic heart disease of native coronary artery without angina pectoris: Secondary | ICD-10-CM | POA: Diagnosis not present

## 2016-11-16 DIAGNOSIS — E1051 Type 1 diabetes mellitus with diabetic peripheral angiopathy without gangrene: Secondary | ICD-10-CM | POA: Diagnosis not present

## 2016-11-16 DIAGNOSIS — I5022 Chronic systolic (congestive) heart failure: Secondary | ICD-10-CM | POA: Diagnosis not present

## 2016-11-16 DIAGNOSIS — I110 Hypertensive heart disease with heart failure: Secondary | ICD-10-CM | POA: Diagnosis not present

## 2016-11-16 DIAGNOSIS — Z4781 Encounter for orthopedic aftercare following surgical amputation: Secondary | ICD-10-CM | POA: Diagnosis not present

## 2016-11-16 LAB — BASIC METABOLIC PANEL
BUN/Creatinine Ratio: 30 — ABNORMAL HIGH (ref 12–28)
BUN: 49 mg/dL — ABNORMAL HIGH (ref 8–27)
CALCIUM: 9.4 mg/dL (ref 8.7–10.3)
CO2: 23 mmol/L (ref 18–29)
Chloride: 95 mmol/L — ABNORMAL LOW (ref 96–106)
Creatinine, Ser: 1.61 mg/dL — ABNORMAL HIGH (ref 0.57–1.00)
GFR calc Af Amer: 37 mL/min/{1.73_m2} — ABNORMAL LOW (ref >59)
GFR calc non Af Amer: 32 mL/min/{1.73_m2} — ABNORMAL LOW (ref >59)
GLUCOSE: 186 mg/dL — AB (ref 65–99)
POTASSIUM: 4.3 mmol/L (ref 3.5–5.2)
SODIUM: 141 mmol/L (ref 134–144)

## 2016-11-16 LAB — LIPID PANEL
CHOLESTEROL TOTAL: 125 mg/dL (ref 100–199)
Chol/HDL Ratio: 2.7 ratio units (ref 0.0–4.4)
HDL: 47 mg/dL (ref >39)
LDL Calculated: 58 mg/dL (ref 0–99)
TRIGLYCERIDES: 99 mg/dL (ref 0–149)
VLDL Cholesterol Cal: 20 mg/dL (ref 5–40)

## 2016-11-22 DIAGNOSIS — I2510 Atherosclerotic heart disease of native coronary artery without angina pectoris: Secondary | ICD-10-CM | POA: Diagnosis not present

## 2016-11-22 DIAGNOSIS — E1051 Type 1 diabetes mellitus with diabetic peripheral angiopathy without gangrene: Secondary | ICD-10-CM | POA: Diagnosis not present

## 2016-11-22 DIAGNOSIS — M349 Systemic sclerosis, unspecified: Secondary | ICD-10-CM | POA: Diagnosis not present

## 2016-11-22 DIAGNOSIS — Z4781 Encounter for orthopedic aftercare following surgical amputation: Secondary | ICD-10-CM | POA: Diagnosis not present

## 2016-11-22 DIAGNOSIS — I5022 Chronic systolic (congestive) heart failure: Secondary | ICD-10-CM | POA: Diagnosis not present

## 2016-11-22 DIAGNOSIS — I110 Hypertensive heart disease with heart failure: Secondary | ICD-10-CM | POA: Diagnosis not present

## 2016-11-23 DIAGNOSIS — M150 Primary generalized (osteo)arthritis: Secondary | ICD-10-CM | POA: Diagnosis not present

## 2016-11-23 DIAGNOSIS — I252 Old myocardial infarction: Secondary | ICD-10-CM | POA: Diagnosis not present

## 2016-11-23 DIAGNOSIS — I5022 Chronic systolic (congestive) heart failure: Secondary | ICD-10-CM | POA: Diagnosis not present

## 2016-11-23 DIAGNOSIS — Z87440 Personal history of urinary (tract) infections: Secondary | ICD-10-CM | POA: Diagnosis not present

## 2016-11-23 DIAGNOSIS — S80221D Blister (nonthermal), right knee, subsequent encounter: Secondary | ICD-10-CM | POA: Diagnosis not present

## 2016-11-23 DIAGNOSIS — I2510 Atherosclerotic heart disease of native coronary artery without angina pectoris: Secondary | ICD-10-CM | POA: Diagnosis not present

## 2016-11-23 DIAGNOSIS — Z951 Presence of aortocoronary bypass graft: Secondary | ICD-10-CM | POA: Diagnosis not present

## 2016-11-23 DIAGNOSIS — Z7982 Long term (current) use of aspirin: Secondary | ICD-10-CM | POA: Diagnosis not present

## 2016-11-23 DIAGNOSIS — M349 Systemic sclerosis, unspecified: Secondary | ICD-10-CM | POA: Diagnosis not present

## 2016-11-23 DIAGNOSIS — E1042 Type 1 diabetes mellitus with diabetic polyneuropathy: Secondary | ICD-10-CM | POA: Diagnosis not present

## 2016-11-23 DIAGNOSIS — E1051 Type 1 diabetes mellitus with diabetic peripheral angiopathy without gangrene: Secondary | ICD-10-CM | POA: Diagnosis not present

## 2016-11-23 DIAGNOSIS — Z853 Personal history of malignant neoplasm of breast: Secondary | ICD-10-CM | POA: Diagnosis not present

## 2016-11-23 DIAGNOSIS — Z794 Long term (current) use of insulin: Secondary | ICD-10-CM | POA: Diagnosis not present

## 2016-11-23 DIAGNOSIS — I110 Hypertensive heart disease with heart failure: Secondary | ICD-10-CM | POA: Diagnosis not present

## 2016-11-23 DIAGNOSIS — S80222D Blister (nonthermal), left knee, subsequent encounter: Secondary | ICD-10-CM | POA: Diagnosis not present

## 2016-11-23 DIAGNOSIS — D510 Vitamin B12 deficiency anemia due to intrinsic factor deficiency: Secondary | ICD-10-CM | POA: Diagnosis not present

## 2016-11-23 DIAGNOSIS — Z89422 Acquired absence of other left toe(s): Secondary | ICD-10-CM | POA: Diagnosis not present

## 2016-11-23 DIAGNOSIS — E785 Hyperlipidemia, unspecified: Secondary | ICD-10-CM | POA: Diagnosis not present

## 2016-11-23 DIAGNOSIS — K219 Gastro-esophageal reflux disease without esophagitis: Secondary | ICD-10-CM | POA: Diagnosis not present

## 2016-11-23 DIAGNOSIS — E039 Hypothyroidism, unspecified: Secondary | ICD-10-CM | POA: Diagnosis not present

## 2016-11-26 ENCOUNTER — Encounter: Payer: Self-pay | Admitting: Internal Medicine

## 2016-11-27 ENCOUNTER — Telehealth: Payer: Self-pay | Admitting: Internal Medicine

## 2016-11-27 DIAGNOSIS — E1042 Type 1 diabetes mellitus with diabetic polyneuropathy: Secondary | ICD-10-CM | POA: Diagnosis not present

## 2016-11-27 DIAGNOSIS — R0602 Shortness of breath: Secondary | ICD-10-CM

## 2016-11-27 DIAGNOSIS — S80221D Blister (nonthermal), right knee, subsequent encounter: Secondary | ICD-10-CM | POA: Diagnosis not present

## 2016-11-27 DIAGNOSIS — I110 Hypertensive heart disease with heart failure: Secondary | ICD-10-CM | POA: Diagnosis not present

## 2016-11-27 DIAGNOSIS — S80222D Blister (nonthermal), left knee, subsequent encounter: Secondary | ICD-10-CM | POA: Diagnosis not present

## 2016-11-27 DIAGNOSIS — I5022 Chronic systolic (congestive) heart failure: Secondary | ICD-10-CM | POA: Diagnosis not present

## 2016-11-27 DIAGNOSIS — I2510 Atherosclerotic heart disease of native coronary artery without angina pectoris: Secondary | ICD-10-CM | POA: Diagnosis not present

## 2016-11-27 NOTE — Telephone Encounter (Signed)
Jeanene from [facility] called and stated that patient states that she is not ready for outpatient therapy. Jeanene would like to have Dr. Harrington Challenger place orders for PT twice a week for 4 more weeks. She also wants to make Dr. Harrington Challenger aware that the RN is no longer wrapping the patient's legs that the RN showed the patient's friend how to do it and the friend has been wrapping them. Darliss Cheney states that she has noticed 2 large water blisters on the patient's knee caps that are draining and she wanted to make Dr. Harrington Challenger aware. Message routed to Dr. Harrington Challenger.

## 2016-11-27 NOTE — Telephone Encounter (Signed)
New message   [facility] is calling from [facility] to get orders:  Orders for PT 2x week for 4 more weeks

## 2016-11-28 NOTE — Telephone Encounter (Signed)
Follow up    Jeanene from [facility] is calling about patients knees. Water blisters have opened and she is asking for a skilled nurse to go and look at the knees.

## 2016-11-28 NOTE — Telephone Encounter (Signed)
These are really orders for surgeon to make for PT  I will try to place orders for outpatient RN but aagain surgeon as well

## 2016-11-29 DIAGNOSIS — S80222D Blister (nonthermal), left knee, subsequent encounter: Secondary | ICD-10-CM | POA: Diagnosis not present

## 2016-11-29 DIAGNOSIS — I110 Hypertensive heart disease with heart failure: Secondary | ICD-10-CM | POA: Diagnosis not present

## 2016-11-29 DIAGNOSIS — I2510 Atherosclerotic heart disease of native coronary artery without angina pectoris: Secondary | ICD-10-CM | POA: Diagnosis not present

## 2016-11-29 DIAGNOSIS — E1042 Type 1 diabetes mellitus with diabetic polyneuropathy: Secondary | ICD-10-CM | POA: Diagnosis not present

## 2016-11-29 DIAGNOSIS — I5022 Chronic systolic (congestive) heart failure: Secondary | ICD-10-CM | POA: Diagnosis not present

## 2016-11-29 DIAGNOSIS — S80221D Blister (nonthermal), right knee, subsequent encounter: Secondary | ICD-10-CM | POA: Diagnosis not present

## 2016-11-29 NOTE — Telephone Encounter (Signed)
From: Lynnell Chad  Sent: 11/26/2016  5:02 PM  To: [facility] [address] Triage  Subject: Non-Urgent Medical Question             You & my therapists have suggested oxygen, even though I did not meet Medicare's requirements in your office (pulse-ox too hi). I can afford it w/o insurance ([employer]). Wondering if it's possible for you to write a prescription and let me try the small oxygen tanks to see if it helps. Then we could pursue trying the "Medicare test" again (pulse-ox below 88, 6 minutes if walking). It was 75 after exertion this past Friday at home. Several have urged me to re-test in your office. I don't want to suggest anything not protocol.    The other issue is a sleep test. Again, several have expressed a need for that. I'm sure I have sleep apnea. I'm not getting a lot of rest, which is not good no matter what. I don't know what is involved in setting that up, but I am willing to do it if you think it's a good idea.

## 2016-12-01 DIAGNOSIS — I5022 Chronic systolic (congestive) heart failure: Secondary | ICD-10-CM | POA: Diagnosis not present

## 2016-12-01 DIAGNOSIS — E1042 Type 1 diabetes mellitus with diabetic polyneuropathy: Secondary | ICD-10-CM | POA: Diagnosis not present

## 2016-12-01 DIAGNOSIS — S80221D Blister (nonthermal), right knee, subsequent encounter: Secondary | ICD-10-CM | POA: Diagnosis not present

## 2016-12-01 DIAGNOSIS — I2510 Atherosclerotic heart disease of native coronary artery without angina pectoris: Secondary | ICD-10-CM | POA: Diagnosis not present

## 2016-12-01 DIAGNOSIS — S80222D Blister (nonthermal), left knee, subsequent encounter: Secondary | ICD-10-CM | POA: Diagnosis not present

## 2016-12-01 DIAGNOSIS — I110 Hypertensive heart disease with heart failure: Secondary | ICD-10-CM | POA: Diagnosis not present

## 2016-12-03 ENCOUNTER — Telehealth: Payer: Self-pay

## 2016-12-03 NOTE — Telephone Encounter (Signed)
Anna for verbal order, but if any increase in size of wound or surrounding erythema - recommend evaluation.

## 2016-12-03 NOTE — Telephone Encounter (Addendum)
THIS MESSAGE IS FROM AUTUMN LAWSON RN FROM [facility] FOR DR. Carlota Raspberry: PATIENT HAS A SMALL WOUND ON HER (R) KNEE BECAUSE OF A WATER BLISTER WHERE THE TOP LAYER HAS COME OFF. IT IS 3 1/2 CENTIMETERS BY 5 CENTIMETERS. SHE WOULD LIKE TO USE SILVER ALGINATE AND CHANGE IT DAILY. SHE NEEDS TO GET A VERBAL ORDER TO DO THIS. BEST PHONE [phone] ([place]). [place]

## 2016-12-03 NOTE — Telephone Encounter (Signed)
Ok to give verbal 

## 2016-12-04 NOTE — Telephone Encounter (Signed)
Spoke with Nonda Lou RN advised ok to silver alginate any changes need to be evaluated per Dr. Carlota Raspberry.

## 2016-12-06 DIAGNOSIS — I5022 Chronic systolic (congestive) heart failure: Secondary | ICD-10-CM | POA: Diagnosis not present

## 2016-12-06 DIAGNOSIS — I110 Hypertensive heart disease with heart failure: Secondary | ICD-10-CM | POA: Diagnosis not present

## 2016-12-06 DIAGNOSIS — S80222D Blister (nonthermal), left knee, subsequent encounter: Secondary | ICD-10-CM | POA: Diagnosis not present

## 2016-12-06 DIAGNOSIS — I2510 Atherosclerotic heart disease of native coronary artery without angina pectoris: Secondary | ICD-10-CM | POA: Diagnosis not present

## 2016-12-06 DIAGNOSIS — E1042 Type 1 diabetes mellitus with diabetic polyneuropathy: Secondary | ICD-10-CM | POA: Diagnosis not present

## 2016-12-06 DIAGNOSIS — S80221D Blister (nonthermal), right knee, subsequent encounter: Secondary | ICD-10-CM | POA: Diagnosis not present

## 2016-12-07 ENCOUNTER — Telehealth: Payer: Self-pay | Admitting: Internal Medicine

## 2016-12-07 DIAGNOSIS — I5022 Chronic systolic (congestive) heart failure: Secondary | ICD-10-CM | POA: Diagnosis not present

## 2016-12-07 DIAGNOSIS — S80221D Blister (nonthermal), right knee, subsequent encounter: Secondary | ICD-10-CM | POA: Diagnosis not present

## 2016-12-07 DIAGNOSIS — E1042 Type 1 diabetes mellitus with diabetic polyneuropathy: Secondary | ICD-10-CM | POA: Diagnosis not present

## 2016-12-07 DIAGNOSIS — S80222D Blister (nonthermal), left knee, subsequent encounter: Secondary | ICD-10-CM | POA: Diagnosis not present

## 2016-12-07 DIAGNOSIS — I2510 Atherosclerotic heart disease of native coronary artery without angina pectoris: Secondary | ICD-10-CM | POA: Diagnosis not present

## 2016-12-07 DIAGNOSIS — I5042 Chronic combined systolic (congestive) and diastolic (congestive) heart failure: Secondary | ICD-10-CM

## 2016-12-07 DIAGNOSIS — I110 Hypertensive heart disease with heart failure: Secondary | ICD-10-CM | POA: Diagnosis not present

## 2016-12-07 NOTE — Telephone Encounter (Signed)
New message      Pt c/o medication issue:  1. Name of Medication:  torsemide 2. How are you currently taking this medication (dosage and times per day)? 20mg   daily 3. Are you having a reaction (difficulty breathing--STAT)? no 4. What is your medication issue? Pt states she is still retaining fluid and want to talk to the nurse about changing her dosage

## 2016-12-07 NOTE — Telephone Encounter (Signed)
Patient called reporting her legs and abdomen are more swollen than usual.   She has eaten extra salt and has not been resting like normal so legs have not been elevated enough.  Denies SOB at rest.  Pt asked if it is ok to take an extra 1/2 of one tablet of torsemide.   Her normal dose is 40 mg daily, she would increase to 50 mg.     Advised to take 50 mg for 3 days (fri, sat and sun) and then if labs are needed we can arrange for that since she has an appointment with Dr. Carlota Raspberry on Monday.  Pt is aware I am sending message to Dr. Harrington Challenger and will call her back with other recommendations.

## 2016-12-07 NOTE — Telephone Encounter (Signed)
Agree with plan 

## 2016-12-07 NOTE — Telephone Encounter (Signed)
Per Dr. Harrington Challenger, orders for BMET and BNP on 2/26 at Dr. Vonna Kotyk office.

## 2016-12-07 NOTE — Telephone Encounter (Signed)
Staff message sent to [place] [facility]. ([facility])

## 2016-12-10 ENCOUNTER — Ambulatory Visit (INDEPENDENT_AMBULATORY_CARE_PROVIDER_SITE_OTHER): Payer: Medicare Other | Admitting: Family Medicine

## 2016-12-10 ENCOUNTER — Encounter: Payer: Self-pay | Admitting: Family Medicine

## 2016-12-10 VITALS — BP 118/62 | HR 98 | Temp 97.3°F | Ht 63.5 in | Wt 159.0 lb

## 2016-12-10 DIAGNOSIS — N183 Chronic kidney disease, stage 3 (moderate): Secondary | ICD-10-CM | POA: Diagnosis not present

## 2016-12-10 DIAGNOSIS — S81802A Unspecified open wound, left lower leg, initial encounter: Secondary | ICD-10-CM

## 2016-12-10 DIAGNOSIS — S81002A Unspecified open wound, left knee, initial encounter: Secondary | ICD-10-CM | POA: Diagnosis not present

## 2016-12-10 DIAGNOSIS — E1022 Type 1 diabetes mellitus with diabetic chronic kidney disease: Secondary | ICD-10-CM | POA: Diagnosis not present

## 2016-12-10 DIAGNOSIS — I5042 Chronic combined systolic (congestive) and diastolic (congestive) heart failure: Secondary | ICD-10-CM | POA: Diagnosis not present

## 2016-12-10 DIAGNOSIS — S91002A Unspecified open wound, left ankle, initial encounter: Secondary | ICD-10-CM

## 2016-12-10 DIAGNOSIS — E1065 Type 1 diabetes mellitus with hyperglycemia: Secondary | ICD-10-CM

## 2016-12-10 DIAGNOSIS — Z23 Encounter for immunization: Secondary | ICD-10-CM | POA: Diagnosis not present

## 2016-12-10 DIAGNOSIS — IMO0002 Reserved for concepts with insufficient information to code with codable children: Secondary | ICD-10-CM

## 2016-12-10 MED ORDER — DOXYCYCLINE HYCLATE 100 MG PO TABS: 100.0000 mg | ORAL_TABLET | Freq: Two times a day (BID) | ORAL | 0 refills | Status: DC

## 2016-12-10 NOTE — Progress Notes (Signed)
By signing my name below, I, [name], attest that this documentation has been prepared under the direction and in the presence of [name] Ray, MD.  Electronically Signed: Verlee [name], Medical Scribe. 12/10/16. 2:06 PM.  Subjective:    Patient ID: [name], female    DOB: [birth year], 71 y.o.   MRN: [id number]  HPI Chief Complaint  Patient presents with  . Knee Injury    X 1 mth open wound on left knee     HPI Comments: [name] is a 71 y.o. female who presents to the [facility] complaining of intermittent left knee wound onset 2 weeks ago. [name] has a hx of multiple medical problems per problem list including DM and diabetic osteomyelitis. She is followed by multiple specialist, but followed by me for primary care.  Left knee: Pt report she has an intermittent scab over her left knee with surrounding dead skin, but it's been getting worse and hasn't healed over the past 2 weeks. Pt states the scab has been coming off in the shower and reports associated sxs of erythema, non malodorus clear drainage, and other water blisters on various places on both legs. Pt reports having teneder skin even without the wound on her knee and she's used to having water blisters on her legs. She has been using silvadene cream, and non stick gauze (maxorb silver antimicrobial wound dressing). Pt mixed up her insulin 3 weeks ago, but it's better now with nl readings in the morning, but high blood sugar levels int he afternoon. Pt's endocrinologist is Dr. Chalmers [name]. She has been chair bound and mentions she keeps bumping her knee into her kitchen cabinets. Home health has been coming out to check on her knee from the referral of her PT. Denies fever, chills, and warmth.  Left calf swelling: Reports acute swelling. Pt called her cardiologist about the swelling and he suggested she increase her torsemide  Patient Active Problem List   Diagnosis Date Noted  .  Microcytosis 07/05/2016  . PAD (peripheral artery disease) ([place]) 03/23/2016  . Status post transmetatarsal amputation of left foot ([place]) 03/21/2016  . Pain in limb 03/02/2014  . Skin tear 11/17/2013  . PVD (peripheral vascular disease) ([place]) 09/22/2013  . Aftercare following surgery of the circulatory system, [place] 06/09/2013  . Atherosclerosis of native arteries of the extremities with ulceration(440.23) 06/09/2013  . Diabetic osteomyelitis ([place]) 06/02/2013  . 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 Abnormality of gait 01/21/2013  . CKD (chronic kidney disease), stage III 11/29/2012  . Chronic combined systolic and diastolic heart failure ([place]) 04/24/2012  . Health care maintenance 04/24/2012  . Iron deficiency anemia 04/15/2012  . Vitamin B12 deficiency (dietary) anemia 04/15/2012  . Aortic stenosis 08/22/2011  . ACQUIRED HEMOLYTIC ANEMIA UNSPECIFIED 06/06/2009  . CAD 02/03/2009  . CAROTID ARTERY STENOSIS, WITHOUT INFARCTION 02/03/2009  . Peripheral artery disease 02/03/2009  . Unspecified hypothyroidism 02/01/2009  . Dyslipidemia 02/01/2009  . Essential hypertension 02/01/2009  . DM (diabetes mellitus) type I uncontrolled with renal manifestation ([place]) 10/15/1949   Past Medical History:  Diagnosis Date  . Anemia   . Anginal pain ([place])    "history; not currently" (08/28/2012)  . Aortic stenosis    0.8cm2 on echo in 10/2011  . Arthritis    "hands, hips, all over" (08/28/2012  . B12 deficiency anemia    B 12 injection "monthly since 05/2012" (08/28/2012)  . Bilateral carpal tunnel syndrome 1970s  .  Blood transfusion    no reaction from transfusion; "when I had heart surgery" (08/28/2012)  . Breast cancer ([place])    left  . CAD (coronary artery disease)    s/p CABG x3 in 1993; last cath in 2010  . Cat bite 2009   with MRSA; required I & D  . Cellulitis   . Cellulitis June 2013, Nov. 2013 and Feb. 14, 2014   Left leg  . CHF (congestive heart  failure) ([place]) Aug. 2012   Echo 10/2011: Mild LVH, EF 30%, apex akinetic, septal HK, grade 2 diastolic dysfunction, or functionally bicuspid aortic valve, mild aortic stenosis by gradient, severe by calculated AVA-suspect A. Aortic stenosis probably moderate, trivial MR, mild LAE, mild RAE.   [name] Claudication ([place])   . Exertional dyspnea    "because of the heart failure" (08/28/2012)  . GERD (gastroesophageal reflux disease)   . Heart murmur   . History of bronchitis    "I've had it twice in my life" (08/28/2012)  . History of recurrent UTIs   . HTN (hypertension)   . Hyperlipidemia    on Lipitor  . Hypothyroidism   . Iron deficiency anemia 06/13/2012   "iron infusion" (08/28/2012  . Myocardial infarction 1986   "silent"  . Osteoarthritis of both feet   . Osteomyelitis ([place])    left foot  . PVD (peripheral vascular disease) ([place])    Toes amputated from left foot and has had prior bypass on left leg. Followed by Dr. Donnetta Hutching  . Scleroderma ([place])   . Seizure ([place])    ? seizure like event in 2010; workup included stress testing which led to repeat cath.   . Stomach ulcer 1981?   "on Tagament for ~ 1 yr" in 62s  . Type 1 diabetes ([place]) 10/1949   Past Surgical History:  Procedure Laterality Date  . AMPUTATION Right 05/27/2013   Procedure: AMPUTATION DIGIT FIFTH TOE;  Surgeon: Rosetta Posner, MD;  Location: [facility] OR;  Service: Vascular;  Laterality: Right;  . AMPUTATION Left 03/21/2016   Procedure: Left Transmetatarsal Amputation;  Surgeon: Newt Minion, MD;  Location: [facility];  Service: Orthopedics;  Laterality: Left;  . APPENDECTOMY  1991  . BREAST BIOPSY    . BREAST BIOPSY WITH SENTINEL LYMPH NODE BIOPSY AND NEEDLE LOCALIZATION  1984  . CARDIAC CATHETERIZATION  2010   LIMA to LAD patent yet with occluded distal LAD after graft insertion & retrograde is occluded. 3-4 septal perforators arise &are patent. SVG to a large marginal patent; SVG presumably to the distal dominant LCX is occluded,  moderately high grade stenosis of the AV circumflex, but with distal stenoses involving a severely diseased marginal branch not amenable  to PCI  nor is inferior branch   . CARDIAC CATHETERIZATION  10/2011   "unsuccessful attempt of stenting" (08/28/2012)  . CARDIAC CATHETERIZATION  1992; 09/2011  . CARPAL TUNNEL RELEASE  ~ 1970's   bilaterally  . CATARACT EXTRACTION W/ INTRAOCULAR LENS  IMPLANT, BILATERAL  1990's  . CORONARY ARTERY BYPASS GRAFT  1992   LIMA to LAD, SVG to distal LCX & SVG to Marginal  . FEMORAL BYPASS     left leg "related to transmetatarsal amputation" (08/28/2012)  . INCISION AND DRAINAGE OF WOUND  ~ 1967   "infection in left foot" (08/28/2012)  . INCISION AND DRAINAGE OF WOUND  2009   "3 ORs on my right hand after cat bite" (08/28/2012)  . LEFT AND RIGHT HEART CATHETERIZATION WITH CORONARY ANGIOGRAM N/A  09/17/2011   Procedure: LEFT AND RIGHT HEART CATHETERIZATION WITH CORONARY ANGIOGRAM;  Surgeon: Hillary Bow, MD;  Location: [facility] CATH LAB;  Service: Cardiovascular;  Laterality: N/A;  . LOWER EXTREMITY ANGIOGRAM N/A 05/25/2013   Procedure: LOWER EXTREMITY ANGIOGRAM POSS INTERVENTION;  Surgeon: Conrad Kim, MD;  Location: [facility] CATH LAB;  Service: Cardiovascular;  Laterality: N/A;  . MASTECTOMY  11/1982   Left Breast  . PERCUTANEOUS CORONARY STENT INTERVENTION (PCI-S) N/A 11/15/2011   Procedure: PERCUTANEOUS CORONARY STENT INTERVENTION (PCI-S);  Surgeon: Hillary Bow, MD;  Location: [facility] CATH LAB;  Service: Cardiovascular;  Laterality: N/A;  . TONSILLECTOMY  1952   "?adenoids"   . TRANSMETATARSAL AMPUTATION  04/2009 - 10/2009   "4 ORs; left foot"  . TRIGGER FINGER RELEASE  1980's   right thumb  . TUBAL LIGATION  1984  . VITRECTOMY  1990's-2004   "both eyes; I've had total of 4"   Allergies  Allergen Reactions  . Cephalexin Swelling and Rash    No specific area  . Ciprofloxacin Swelling and Rash    No specific area  . Zyvox [Linezolid] Other (See Comments)     Thrombocytopenia developed to 50k after several weeks of therapy  . Adhesive [Tape] Other (See Comments)    "old Johnson[employer]Johnson adhesive tape; takes my skin off; can use paper tape"  . Bactrim [Sulfamethoxazole-Trimethoprim] Other (See Comments)    Hyperkalemia and Increased creatinine  . Daptomycin Other (See Comments)    Had elevated CPK on therapy, not clear that this was due to cubicin  . Vancomycin Other (See Comments)    ? If this played role in her Acute kidney injury   Prior to Admission medications   Medication Sig Start Date End Date Taking? Authorizing Provider  aspirin EC 325 MG EC tablet Take 325 mg by mouth daily.    Yes Historical Provider, MD  atorvastatin (LIPITOR) 40 MG tablet TAKE 1 TABLET BY MOUTH AT BEDTIME 10/22/16  Yes Fay Records, MD  Cyanocobalamin 1000 MCG/ML KIT Inject 1,000 mcg into the skin every 8 (eight) weeks.    Yes Historical Provider, MD  docusate sodium (COLACE) 50 MG capsule Take 50 mg by mouth daily.   Yes Historical Provider, MD  hydrOXYzine (ATARAX/VISTARIL) 10 MG tablet Take 1 tablet (10 mg total) by mouth 3 (three) times daily as needed for itching or anxiety. 03/23/16  Yes Ivan Anchors Love, PA-C  insulin detemir (LEVEMIR) 100 UNIT/ML injection Inject 12-24 Units into the skin 2 (two) times daily. Inject 24 units subutaneously every morning (10am)  and 12 units at bedtime (10pm)   Yes Historical Provider, MD  insulin lispro (HUMALOG) 100 UNIT/ML injection Inject 1-12 Units into the skin 4 (four) times daily as needed for high blood sugar (sliding scale).    Yes Historical Provider, MD  isosorbide mononitrate (IMDUR) 30 MG 24 hr tablet Take 1 tablet (30 mg total) by mouth daily. 06/06/16  Yes Fay Records, MD  levothyroxine (SYNTHROID, LEVOTHROID) 100 MCG tablet Take 100 mcg by mouth daily before breakfast.  05/19/15  Yes Historical Provider, MD  torsemide (DEMADEX) 20 MG tablet Take 2 tablets (40 mg total) by mouth every morning. 11/02/16  Yes Fay Records, MD    Social History   Social History  . Marital status: Single    Spouse name: N/A  . Number of children: N/A  . Years of education: N/A   Occupational History  . Web designer    Social History Main Topics  .  Smoking status: Never Smoker  . Smokeless tobacco: Never Used  . Alcohol use No  . Drug use: No  . Sexual activity: Not Currently    Birth control/ protection: Post-menopausal   Other Topics Concern  . Not on file   Social History Narrative  . No narrative on file   Review of Systems  Constitutional: Negative for chills and fever.  Cardiovascular: Positive for leg swelling.  Skin: Positive for color change and wound. Negative for pallor.   Objective:  Physical Exam  Constitutional: She appears well-developed and well-nourished. No distress.  HENT:  Head: Normocephalic and atraumatic.  Eyes: Conjunctivae are normal.  Neck: Neck supple.  Cardiovascular: Normal rate.   Pulmonary/Chest: Effort normal.  Neurological: She is alert.  Skin: Skin is warm and dry.  She has a wound on the anterior aspect of her left knee - central wound with eschar 1.5 cm with faint surrounding erythema measuring 7 cm Right knee apears to be healing, large slightly abraded blister area with very minimal surrounding erythema No focal tenderness over her patella  Psychiatric: She has a normal mood and affect. Her behavior is normal.  Nursing note and vitals reviewed.   Vitals:   12/10/16 1345  BP: 118/62  Pulse: 98  Temp: 97.3 F (36.3 C)  TempSrc: Oral  SpO2: 97%  Weight: 159 lb (72.1 kg)  Height: 5' 3.5" (1.613 m)  Body mass index is 27.72 kg/m. Assessment & Plan:   [name] is a 71 y.o. female Open wound of left knee, leg, and ankle with complication, initial encounter - Uncontrolled type 1 diabetes mellitus with stage 3 chronic kidney disease (HCC)Plan: doxycycline (VIBRA-TABS) 100 MG tablet, WOUND CULTURE, Ambulatory referral to Wound Clinic  - Slowly  healing wound on anterior left knee with currently treated right knee posterior/raw area with a silver gauze cover. Some surrounding erythema without true induration/abscess apparent on exam.  -Start doxycycline to cover for secondary cellulitis, refer to wound clinic, initial wet-to-dry dressing, and also plans on following up with her vascular surgeon   -addendum:see patient email regarding visit with vascular surgeon.  rtc precautions if not improving with current treatment.   Need for prophylactic vaccination and inoculation against influenza - Plan: Flu Vaccine QUAD 36+ mos IM  -Flu vaccine given.   Chronic combined systolic and diastolic heart failure (HCC) - Plan: Basic metabolic panel, Pro b natriuretic peptide (BNP) Chronic combined systolic and diastolic CHF (congestive heart failure) (HCC) - Plan: B Nat Peptide, Basic Metabolic Panel (BMET), CBC w/Diff  -Labs obtained as requested by cardiologist.  Meds ordered this encounter  Medications  . doxycycline (VIBRA-TABS) 100 MG tablet    Sig: Take 1 tablet (100 mg total) by mouth 2 (two) times daily.    Dispense:  20 tablet    Refill:  0   Patient Instructions   I will check a wound culture, okay to start doxycycline for now, wet to dry dressings for now,  but follow up with wound care center or Dr. Donnetta Hutching to determine if any changes in treatment needed. If any increased redness, swelling, or discomfort in kneecap itself, return here or other medical provider for recheck.   We recommend that you schedule a mammogram for breast cancer screening. Typically, you do not need a referral to do this. Please contact a local imaging center to schedule your mammogram.  [facility] - [phone]  *ask for the Radiology [place] ([place]) - [phone]  or [phone]  [facility] - [phone] [place] [phone] [facility] - [phone]  *ask for the  [facility] - [phone]  *ask for the Radiology Department [facility] - [phone]  *ask for the [place] - [phone]    IF you received an x-[name] today, you will receive an invoice from [facility] Radiology. Please contact [facility] Radiology at [phone] with questions or concerns regarding your invoice.   IF you received labwork today, you will receive an invoice from [facility]. Please contact [facility] at [phone] with questions or concerns regarding your invoice.   Our billing staff will not be able to assist you with questions regarding bills from these companies.  You will be contacted with the lab results as soon as they are available. The fastest way to get your results is to activate your My Chart account. Instructions are located on the last page of this paperwork. If you have not heard from [place] regarding the results in 2 weeks, please contact this office.      I personally performed the services described in this documentation, which was scribed in my presence. The recorded information has been reviewed and considered for accuracy and completeness, addended by me as needed, and agree with information above.  Signed,   [name] Ray, MD [facility].  12/12/16 4:58 PM

## 2016-12-10 NOTE — Patient Instructions (Addendum)
I will check a wound culture, okay to start doxycycline for now, wet to dry dressings for now,  but follow up with wound care center or Dr. Donnetta Hutching to determine if any changes in treatment needed. If any increased redness, swelling, or discomfort in kneecap itself, return here or other medical provider for recheck.   We recommend that you schedule a mammogram for breast cancer screening. Typically, you do not need a referral to do this. Please contact a local imaging center to schedule your mammogram.  [facility] - [phone]  *ask for the Radiology Department [facility] ([place]) - [phone] or [phone]  [facility] - [phone] [place] [phone] [facility] - [phone]  *ask for the [facility] - [phone]  *ask for the Radiology Department [facility] - [phone]  *ask for the [place] - [phone]    IF you received an x-ray today, you will receive an invoice from [facility] Radiology. Please contact [facility] Radiology at [phone] with questions or concerns regarding your invoice.   IF you received labwork today, you will receive an invoice from [place]. Please contact [facility] at [phone] with questions or concerns regarding your invoice.   Our billing staff will not be able to assist you with questions regarding bills from these companies.  You will be contacted with the lab results as soon as they are available. The fastest way to get your results is to activate your My Chart account. Instructions are located on the last page of this paperwork. If you have not heard from [place] regarding the results in 2 weeks, please contact this office.

## 2016-12-10 NOTE — Addendum Note (Signed)
Addended by: Gari Crown D on: 12/10/2016 02:24 PM   Modules accepted: Orders

## 2016-12-11 ENCOUNTER — Encounter: Payer: Self-pay | Admitting: Vascular Surgery

## 2016-12-11 ENCOUNTER — Encounter: Payer: Self-pay | Admitting: Internal Medicine

## 2016-12-11 ENCOUNTER — Ambulatory Visit (INDEPENDENT_AMBULATORY_CARE_PROVIDER_SITE_OTHER): Payer: Medicare Other | Admitting: Vascular Surgery

## 2016-12-11 ENCOUNTER — Encounter: Payer: Self-pay | Admitting: Family Medicine

## 2016-12-11 VITALS — BP 122/75 | HR 120 | Temp 97.1°F | Resp 22 | Ht 63.5 in | Wt 159.0 lb

## 2016-12-11 DIAGNOSIS — I739 Peripheral vascular disease, unspecified: Secondary | ICD-10-CM | POA: Diagnosis not present

## 2016-12-11 LAB — CBC WITH DIFFERENTIAL/PLATELET
BASOS ABS: 0 10*3/uL (ref 0.0–0.2)
Basos: 0 %
EOS (ABSOLUTE): 0.2 10*3/uL (ref 0.0–0.4)
EOS: 4 %
HEMATOCRIT: 41.2 % (ref 34.0–46.6)
HEMOGLOBIN: 13.9 g/dL (ref 11.1–15.9)
IMMATURE GRANS (ABS): 0 10*3/uL (ref 0.0–0.1)
Immature Granulocytes: 0 %
LYMPHS: 12 %
Lymphocytes Absolute: 0.7 10*3/uL (ref 0.7–3.1)
MCH: 30.3 pg (ref 26.6–33.0)
MCHC: 33.7 g/dL (ref 31.5–35.7)
MCV: 90 fL (ref 79–97)
MONOCYTES: 12 %
Monocytes Absolute: 0.7 10*3/uL (ref 0.1–0.9)
NEUTROS ABS: 4 10*3/uL (ref 1.4–7.0)
Neutrophils: 72 %
Platelets: 198 10*3/uL (ref 150–379)
RBC: 4.59 x10E6/uL (ref 3.77–5.28)
RDW: 16.5 % — ABNORMAL HIGH (ref 12.3–15.4)
WBC: 5.5 10*3/uL (ref 3.4–10.8)

## 2016-12-11 LAB — BASIC METABOLIC PANEL
BUN / CREAT RATIO: 36 — AB (ref 12–28)
BUN: 52 mg/dL — ABNORMAL HIGH (ref 8–27)
CO2: 24 mmol/L (ref 18–29)
CREATININE: 1.46 mg/dL — AB (ref 0.57–1.00)
Calcium: 9.4 mg/dL (ref 8.7–10.3)
Chloride: 97 mmol/L (ref 96–106)
GFR calc Af Amer: 41 mL/min/{1.73_m2} — ABNORMAL LOW (ref >59)
GFR calc non Af Amer: 36 mL/min/{1.73_m2} — ABNORMAL LOW (ref >59)
GLUCOSE: 143 mg/dL — AB (ref 65–99)
POTASSIUM: 4.2 mmol/L (ref 3.5–5.2)
SODIUM: 139 mmol/L (ref 134–144)

## 2016-12-11 LAB — PRO B NATRIURETIC PEPTIDE: NT-PRO BNP: 21500 pg/mL — AB (ref 0–301)

## 2016-12-11 LAB — BRAIN NATRIURETIC PEPTIDE: BNP: 1134.8 pg/mL — ABNORMAL HIGH (ref 0.0–100.0)

## 2016-12-11 NOTE — Progress Notes (Signed)
[facility] [place]  Patient name: [name] MRN: [id number] DOB: 08-Apr-1946 Sex: female  REASON FOR VISIT: Evaluation of bilateral knee ulceration  HPI: [name] is a 71 y.o. female today for evaluation of bilateral knee ulceration. She presented as an add on calling with concern regarding healing issues. She has an extensive past history of the left leg revascularization. On my last visit with her she had had a redo transmetatarsal dictation by Dr. Sharol Given. She had a prolonged period of incapacitation and had the developed bilateral lower extremity strictures. This is improved a slight amount. She did report an episode of swelling in her legs and developed a in a sized blister on her right knee. This spontaneously ruptured has very superficial appearance to it. She also reports striking her left kneecap on her counter cabinets. Has had a very slow healing of this with eschar present. Was concerned about this and asked to be seen today for evaluation. No evidence of lower extremity ischemia. Continues to have bouts with congestive heart failure which is her norm  Past Medical History:  Diagnosis Date  . Anemia   . Anginal pain ([place])    "history; not currently" (08/28/2012)  . Aortic stenosis    0.8cm2 on echo in 10/2011  . Arthritis    "hands, hips, all over" (08/28/2012  . B12 deficiency anemia    B 12 injection "monthly since 05/2012" (08/28/2012)  . Bilateral carpal tunnel syndrome 1970s  . Blood transfusion    no reaction from transfusion; "when I had heart surgery" (08/28/2012)  . Breast cancer ([place])    left  . CAD (coronary artery disease)    s/p CABG x3 in 1993; last cath in 2010  . Cat bite 2009   with MRSA; required I & D  . Cellulitis   . Cellulitis June 2013, Nov. 2013 and Feb. 14, 2014   Left leg  . CHF (congestive heart failure) ([place]) Aug. 2012   Echo 10/2011: Mild LVH, EF 30%, apex akinetic,  septal HK, grade 2 diastolic dysfunction, or functionally bicuspid aortic valve, mild aortic stenosis by gradient, severe by calculated AVA-suspect A. Aortic stenosis probably moderate, trivial MR, mild LAE, mild RAE.   [name] Claudication ([place])   . Exertional dyspnea    "because of the heart failure" (08/28/2012)  . GERD (gastroesophageal reflux disease)   . Heart murmur   . History of bronchitis    "I've had it twice in my life" (08/28/2012)  . History of recurrent UTIs   . HTN (hypertension)   . Hyperlipidemia    on Lipitor  . Hypothyroidism   . Iron deficiency anemia 06/13/2012   "iron infusion" (08/28/2012  . Myocardial infarction 1986   "silent"  . Osteoarthritis of both feet   . Osteomyelitis ([place])    left foot  . PVD (peripheral vascular disease) ([place])    Toes amputated from left foot and has had prior bypass on left leg. Followed by Dr. Donnetta Hutching  . Scleroderma ([place])   . Seizure ([place])    ? seizure like event in 2010; workup included stress testing which led to repeat cath.   . Stomach ulcer 1981?   "on Tagament for ~ 1 yr" in 28s  . Type 1 diabetes ([place]) 10/1949    Family History  Problem Relation Age of Onset  . Diabetes Mother   . Stroke Mother   . Hypertension Mother   . Stroke Father   . Heart  attack Neg Hx     SOCIAL HISTORY: Social History  Substance Use Topics  . Smoking status: Never Smoker  . Smokeless tobacco: Never Used  . Alcohol use No    Allergies  Allergen Reactions  . Cephalexin Swelling and Rash    No specific area  . Ciprofloxacin Swelling and Rash    No specific area  . Zyvox [Linezolid] Other (See Comments)    Thrombocytopenia developed to 50k after several weeks of therapy  . Adhesive [Tape] Other (See Comments)    "old [employer] adhesive tape; takes my skin off; can use paper tape"  . Bactrim [Sulfamethoxazole-Trimethoprim] Other (See Comments)    Hyperkalemia and Increased creatinine  . Daptomycin Other (See Comments)    Had  elevated CPK on therapy, not clear that this was due to cubicin  . Vancomycin Other (See Comments)    ? If this played role in her Acute kidney injury    Current Outpatient Prescriptions  Medication Sig Dispense Refill  . aspirin EC 325 MG EC tablet Take 325 mg by mouth daily.     [name] atorvastatin (LIPITOR) 40 MG tablet TAKE 1 TABLET BY MOUTH AT BEDTIME 30 tablet 3  . Cyanocobalamin 1000 MCG/ML KIT Inject 1,000 mcg into the skin every 8 (eight) weeks.     . docusate sodium (COLACE) 50 MG capsule Take 50 mg by mouth daily.    [name] doxycycline (VIBRA-TABS) 100 MG tablet Take 1 tablet (100 mg total) by mouth 2 (two) times daily. 20 tablet 0  . hydrOXYzine (ATARAX/VISTARIL) 10 MG tablet Take 1 tablet (10 mg total) by mouth 3 (three) times daily as needed for itching or anxiety. 30 tablet 0  . insulin detemir (LEVEMIR) 100 UNIT/ML injection Inject 12-24 Units into the skin 2 (two) times daily. Inject 24 units subutaneously every morning (10am)  and 12 units at bedtime (10pm)    . insulin lispro (HUMALOG) 100 UNIT/ML injection Inject 1-12 Units into the skin 4 (four) times daily as needed for high blood sugar (sliding scale).     . isosorbide mononitrate (IMDUR) 30 MG 24 hr tablet Take 1 tablet (30 mg total) by mouth daily. 90 tablet 3  . levothyroxine (SYNTHROID, LEVOTHROID) 100 MCG tablet Take 100 mcg by mouth daily before breakfast.   5  . torsemide (DEMADEX) 20 MG tablet Take 2 tablets (40 mg total) by mouth every morning. 180 tablet 2   No current facility-administered medications for this visit.     REVIEW OF SYSTEMS:  '[X]'  denotes positive finding, '[ ]'  denotes negative finding Cardiac  Comments:  Chest pain or chest pressure:    Shortness of breath upon exertion: x   Short of breath when lying flat:    Irregular heart rhythm:        Vascular    Pain in calf, thigh, or hip brought on by ambulation:    Pain in feet at night that wakes you up from your sleep:     Blood clot in your veins:      Leg swelling:  x         PHYSICAL EXAM: Vitals:   12/11/16 1433  BP: 122/75  Pulse: (!) 120  Resp: (!) 22  Temp: 97.1 F (36.2 C)  TempSrc: Oral  SpO2: 99%  Weight: 159 lb (72.1 kg)  Height: 5' 3.5" (1.613 m)    GENERAL: The patient is a well-nourished female, in no acute distress. The vital signs are documented above. CARDIOVASCULAR: She does have  a palpable anterior tibial graft pulse runs laterally over her thigh. Listening to this with Dopplers well with good Doppler flow PULMONARY: There is good air exchange  MUSCULOSKELETAL: There are no major deformities or cyanosis. NEUROLOGIC: No focal weakness or paresthesias are detected. SKIN: She does have an eschar with surrounding erythema over her left patella region. The eschars approximately 1-1/2 cm in size. On the right she has a superficial open area where the blister ruptured which is approximately 3-4 cm in size PSYCHIATRIC: The patient has a normal affect.  DATA:  No noninvasive studies today  MEDICAL ISSUES: I do not see any evidence of ischemia to this area. Would agree with continued topical local care as she is being done on her right knee and would keep the left knee had dry after routine bathing. She has a home health nurse coming multiple times per week and they will continue to watch this. She will see [place] again in 3 months for routine follow-up in graft scan    Rosetta Posner, MD [facility] Tel [phone] Pager [phone]

## 2016-12-12 ENCOUNTER — Telehealth: Payer: Self-pay | Admitting: Internal Medicine

## 2016-12-12 ENCOUNTER — Telehealth (INDEPENDENT_AMBULATORY_CARE_PROVIDER_SITE_OTHER): Payer: Self-pay | Admitting: Radiology

## 2016-12-12 NOTE — Telephone Encounter (Signed)
Patient calling wanting a recommendation on a surgeon in [place] or [place] area. She has 71 y.o female friend with foot problems. Asking if you had any recommendations. CB# [phone].

## 2016-12-12 NOTE — Telephone Encounter (Signed)
I called and advised patient of message below.  

## 2016-12-12 NOTE — Telephone Encounter (Signed)
New message    Pt states that she is returning a nurse's phone call who told her to contact triage. States triage will know what this is about -

## 2016-12-12 NOTE — Telephone Encounter (Signed)
U call, I do not know of any foot and ankle specialist in [place].

## 2016-12-12 NOTE — Telephone Encounter (Signed)
Spoke with patient about lab done 12/10/16 

## 2016-12-12 NOTE — Telephone Encounter (Signed)
LMTCB

## 2016-12-13 DIAGNOSIS — E1042 Type 1 diabetes mellitus with diabetic polyneuropathy: Secondary | ICD-10-CM | POA: Diagnosis not present

## 2016-12-13 DIAGNOSIS — S80222D Blister (nonthermal), left knee, subsequent encounter: Secondary | ICD-10-CM | POA: Diagnosis not present

## 2016-12-13 DIAGNOSIS — S80221D Blister (nonthermal), right knee, subsequent encounter: Secondary | ICD-10-CM | POA: Diagnosis not present

## 2016-12-13 DIAGNOSIS — I110 Hypertensive heart disease with heart failure: Secondary | ICD-10-CM | POA: Diagnosis not present

## 2016-12-13 DIAGNOSIS — I2510 Atherosclerotic heart disease of native coronary artery without angina pectoris: Secondary | ICD-10-CM | POA: Diagnosis not present

## 2016-12-13 DIAGNOSIS — I5022 Chronic systolic (congestive) heart failure: Secondary | ICD-10-CM | POA: Diagnosis not present

## 2016-12-13 LAB — WOUND CULTURE

## 2016-12-13 NOTE — Telephone Encounter (Signed)
Per Melissa at [facility], order has been placed for overnight pulse oximetry. They are unable to perform daytime sat checks, those would need done in the office if needed.

## 2016-12-13 NOTE — Addendum Note (Signed)
Addended by: Rodman Key on: 12/13/2016 03:52 PM   Modules accepted: Orders

## 2016-12-15 ENCOUNTER — Encounter: Payer: Self-pay | Admitting: Family Medicine

## 2016-12-18 ENCOUNTER — Telehealth: Payer: Self-pay | Admitting: Family Medicine

## 2016-12-18 DIAGNOSIS — S80221D Blister (nonthermal), right knee, subsequent encounter: Secondary | ICD-10-CM | POA: Diagnosis not present

## 2016-12-18 DIAGNOSIS — E1042 Type 1 diabetes mellitus with diabetic polyneuropathy: Secondary | ICD-10-CM | POA: Diagnosis not present

## 2016-12-18 DIAGNOSIS — I5022 Chronic systolic (congestive) heart failure: Secondary | ICD-10-CM | POA: Diagnosis not present

## 2016-12-18 DIAGNOSIS — I2510 Atherosclerotic heart disease of native coronary artery without angina pectoris: Secondary | ICD-10-CM | POA: Diagnosis not present

## 2016-12-18 DIAGNOSIS — I110 Hypertensive heart disease with heart failure: Secondary | ICD-10-CM | POA: Diagnosis not present

## 2016-12-18 DIAGNOSIS — S80222D Blister (nonthermal), left knee, subsequent encounter: Secondary | ICD-10-CM | POA: Diagnosis not present

## 2016-12-18 NOTE — Telephone Encounter (Signed)
Ok to give verbal order.

## 2016-12-18 NOTE — Telephone Encounter (Signed)
[name] from [place] care [phone] would like for Dr Carlota Raspberry to put in orders for pt NURSE VISIT 1 WEEK 5 AND APPLY XEROFORM TO PT RIGHT KNEE

## 2016-12-19 NOTE — Telephone Encounter (Signed)
Verbal order ok  

## 2016-12-20 ENCOUNTER — Telehealth: Payer: Self-pay | Admitting: Internal Medicine

## 2016-12-20 NOTE — Telephone Encounter (Signed)
[name] is calling to follow up to see Whether Dr. Harrington Challenger received the lab work on [name]  . Please call   Thanks

## 2016-12-20 NOTE — Telephone Encounter (Signed)
Verbal order given  

## 2016-12-21 NOTE — Telephone Encounter (Signed)
I have not seen labs.

## 2016-12-24 ENCOUNTER — Telehealth: Payer: Self-pay | Admitting: Internal Medicine

## 2016-12-24 ENCOUNTER — Emergency Department (HOSPITAL_COMMUNITY): Payer: Medicare Other

## 2016-12-24 ENCOUNTER — Inpatient Hospital Stay (HOSPITAL_COMMUNITY)
Admission: EM | Admit: 2016-12-24 | Discharge: 2017-01-13 | DRG: 291 | Disposition: E | Payer: Medicare Other | Attending: Cardiology | Admitting: Cardiology

## 2016-12-24 ENCOUNTER — Encounter (HOSPITAL_COMMUNITY): Payer: Self-pay | Admitting: Emergency Medicine

## 2016-12-24 DIAGNOSIS — N3000 Acute cystitis without hematuria: Secondary | ICD-10-CM | POA: Diagnosis not present

## 2016-12-24 DIAGNOSIS — Z9851 Tubal ligation status: Secondary | ICD-10-CM

## 2016-12-24 DIAGNOSIS — J440 Chronic obstructive pulmonary disease with acute lower respiratory infection: Secondary | ICD-10-CM | POA: Diagnosis present

## 2016-12-24 DIAGNOSIS — N390 Urinary tract infection, site not specified: Secondary | ICD-10-CM | POA: Diagnosis present

## 2016-12-24 DIAGNOSIS — R579 Shock, unspecified: Secondary | ICD-10-CM

## 2016-12-24 DIAGNOSIS — E538 Deficiency of other specified B group vitamins: Secondary | ICD-10-CM | POA: Diagnosis present

## 2016-12-24 DIAGNOSIS — Z955 Presence of coronary angioplasty implant and graft: Secondary | ICD-10-CM

## 2016-12-24 DIAGNOSIS — L03115 Cellulitis of right lower limb: Secondary | ICD-10-CM

## 2016-12-24 DIAGNOSIS — I10 Essential (primary) hypertension: Secondary | ICD-10-CM | POA: Diagnosis present

## 2016-12-24 DIAGNOSIS — I5043 Acute on chronic combined systolic (congestive) and diastolic (congestive) heart failure: Secondary | ICD-10-CM | POA: Diagnosis present

## 2016-12-24 DIAGNOSIS — N184 Chronic kidney disease, stage 4 (severe): Secondary | ICD-10-CM | POA: Diagnosis present

## 2016-12-24 DIAGNOSIS — IMO0002 Reserved for concepts with insufficient information to code with codable children: Secondary | ICD-10-CM | POA: Diagnosis present

## 2016-12-24 DIAGNOSIS — R05 Cough: Secondary | ICD-10-CM | POA: Diagnosis not present

## 2016-12-24 DIAGNOSIS — Z7982 Long term (current) use of aspirin: Secondary | ICD-10-CM

## 2016-12-24 DIAGNOSIS — J181 Lobar pneumonia, unspecified organism: Secondary | ICD-10-CM | POA: Diagnosis not present

## 2016-12-24 DIAGNOSIS — I484 Atypical atrial flutter: Secondary | ICD-10-CM

## 2016-12-24 DIAGNOSIS — N183 Chronic kidney disease, stage 3 unspecified: Secondary | ICD-10-CM | POA: Diagnosis present

## 2016-12-24 DIAGNOSIS — Z515 Encounter for palliative care: Secondary | ICD-10-CM | POA: Diagnosis not present

## 2016-12-24 DIAGNOSIS — R0609 Other forms of dyspnea: Secondary | ICD-10-CM | POA: Diagnosis not present

## 2016-12-24 DIAGNOSIS — N170 Acute kidney failure with tubular necrosis: Secondary | ICD-10-CM | POA: Diagnosis present

## 2016-12-24 DIAGNOSIS — I480 Paroxysmal atrial fibrillation: Secondary | ICD-10-CM | POA: Diagnosis not present

## 2016-12-24 DIAGNOSIS — R39198 Other difficulties with micturition: Secondary | ICD-10-CM | POA: Diagnosis not present

## 2016-12-24 DIAGNOSIS — R0602 Shortness of breath: Secondary | ICD-10-CM

## 2016-12-24 DIAGNOSIS — Z961 Presence of intraocular lens: Secondary | ICD-10-CM | POA: Diagnosis present

## 2016-12-24 DIAGNOSIS — I350 Nonrheumatic aortic (valve) stenosis: Secondary | ICD-10-CM | POA: Diagnosis present

## 2016-12-24 DIAGNOSIS — L03116 Cellulitis of left lower limb: Secondary | ICD-10-CM | POA: Diagnosis present

## 2016-12-24 DIAGNOSIS — D696 Thrombocytopenia, unspecified: Secondary | ICD-10-CM | POA: Diagnosis present

## 2016-12-24 DIAGNOSIS — E1065 Type 1 diabetes mellitus with hyperglycemia: Secondary | ICD-10-CM | POA: Diagnosis present

## 2016-12-24 DIAGNOSIS — Z853 Personal history of malignant neoplasm of breast: Secondary | ICD-10-CM | POA: Diagnosis not present

## 2016-12-24 DIAGNOSIS — J9621 Acute and chronic respiratory failure with hypoxia: Secondary | ICD-10-CM

## 2016-12-24 DIAGNOSIS — E871 Hypo-osmolality and hyponatremia: Secondary | ICD-10-CM | POA: Diagnosis not present

## 2016-12-24 DIAGNOSIS — R0600 Dyspnea, unspecified: Secondary | ICD-10-CM | POA: Diagnosis not present

## 2016-12-24 DIAGNOSIS — I2583 Coronary atherosclerosis due to lipid rich plaque: Secondary | ICD-10-CM | POA: Diagnosis not present

## 2016-12-24 DIAGNOSIS — Z888 Allergy status to other drugs, medicaments and biological substances status: Secondary | ICD-10-CM

## 2016-12-24 DIAGNOSIS — I4892 Unspecified atrial flutter: Secondary | ICD-10-CM | POA: Diagnosis present

## 2016-12-24 DIAGNOSIS — R0902 Hypoxemia: Secondary | ICD-10-CM

## 2016-12-24 DIAGNOSIS — Z9841 Cataract extraction status, right eye: Secondary | ICD-10-CM

## 2016-12-24 DIAGNOSIS — J9601 Acute respiratory failure with hypoxia: Secondary | ICD-10-CM | POA: Diagnosis not present

## 2016-12-24 DIAGNOSIS — I482 Chronic atrial fibrillation: Secondary | ICD-10-CM | POA: Diagnosis not present

## 2016-12-24 DIAGNOSIS — I739 Peripheral vascular disease, unspecified: Secondary | ICD-10-CM | POA: Diagnosis present

## 2016-12-24 DIAGNOSIS — I255 Ischemic cardiomyopathy: Secondary | ICD-10-CM | POA: Diagnosis present

## 2016-12-24 DIAGNOSIS — I25810 Atherosclerosis of coronary artery bypass graft(s) without angina pectoris: Secondary | ICD-10-CM | POA: Diagnosis present

## 2016-12-24 DIAGNOSIS — D509 Iron deficiency anemia, unspecified: Secondary | ICD-10-CM | POA: Diagnosis present

## 2016-12-24 DIAGNOSIS — I5023 Acute on chronic systolic (congestive) heart failure: Secondary | ICD-10-CM | POA: Diagnosis not present

## 2016-12-24 DIAGNOSIS — Z8249 Family history of ischemic heart disease and other diseases of the circulatory system: Secondary | ICD-10-CM

## 2016-12-24 DIAGNOSIS — Z87440 Personal history of urinary (tract) infections: Secondary | ICD-10-CM

## 2016-12-24 DIAGNOSIS — I5082 Biventricular heart failure: Secondary | ICD-10-CM | POA: Diagnosis present

## 2016-12-24 DIAGNOSIS — I130 Hypertensive heart and chronic kidney disease with heart failure and stage 1 through stage 4 chronic kidney disease, or unspecified chronic kidney disease: Secondary | ICD-10-CM | POA: Diagnosis present

## 2016-12-24 DIAGNOSIS — Z833 Family history of diabetes mellitus: Secondary | ICD-10-CM

## 2016-12-24 DIAGNOSIS — Z8711 Personal history of peptic ulcer disease: Secondary | ICD-10-CM

## 2016-12-24 DIAGNOSIS — Z9842 Cataract extraction status, left eye: Secondary | ICD-10-CM

## 2016-12-24 DIAGNOSIS — E039 Hypothyroidism, unspecified: Secondary | ICD-10-CM | POA: Diagnosis present

## 2016-12-24 DIAGNOSIS — R5381 Other malaise: Secondary | ICD-10-CM

## 2016-12-24 DIAGNOSIS — Z823 Family history of stroke: Secondary | ICD-10-CM

## 2016-12-24 DIAGNOSIS — I129 Hypertensive chronic kidney disease with stage 1 through stage 4 chronic kidney disease, or unspecified chronic kidney disease: Secondary | ICD-10-CM | POA: Diagnosis not present

## 2016-12-24 DIAGNOSIS — E1051 Type 1 diabetes mellitus with diabetic peripheral angiopathy without gangrene: Secondary | ICD-10-CM | POA: Diagnosis present

## 2016-12-24 DIAGNOSIS — I878 Other specified disorders of veins: Secondary | ICD-10-CM | POA: Diagnosis present

## 2016-12-24 DIAGNOSIS — E1029 Type 1 diabetes mellitus with other diabetic kidney complication: Secondary | ICD-10-CM | POA: Diagnosis present

## 2016-12-24 DIAGNOSIS — Z452 Encounter for adjustment and management of vascular access device: Secondary | ICD-10-CM | POA: Diagnosis not present

## 2016-12-24 DIAGNOSIS — I700 Atherosclerosis of aorta: Secondary | ICD-10-CM | POA: Diagnosis not present

## 2016-12-24 DIAGNOSIS — J9690 Respiratory failure, unspecified, unspecified whether with hypoxia or hypercapnia: Secondary | ICD-10-CM

## 2016-12-24 DIAGNOSIS — L0390 Cellulitis, unspecified: Secondary | ICD-10-CM | POA: Diagnosis present

## 2016-12-24 DIAGNOSIS — E1022 Type 1 diabetes mellitus with diabetic chronic kidney disease: Secondary | ICD-10-CM | POA: Diagnosis not present

## 2016-12-24 DIAGNOSIS — N182 Chronic kidney disease, stage 2 (mild): Secondary | ICD-10-CM | POA: Diagnosis not present

## 2016-12-24 DIAGNOSIS — I4891 Unspecified atrial fibrillation: Secondary | ICD-10-CM | POA: Diagnosis present

## 2016-12-24 DIAGNOSIS — Z9012 Acquired absence of left breast and nipple: Secondary | ICD-10-CM

## 2016-12-24 DIAGNOSIS — R188 Other ascites: Secondary | ICD-10-CM | POA: Diagnosis present

## 2016-12-24 DIAGNOSIS — R040 Epistaxis: Secondary | ICD-10-CM | POA: Diagnosis present

## 2016-12-24 DIAGNOSIS — R570 Cardiogenic shock: Secondary | ICD-10-CM | POA: Diagnosis present

## 2016-12-24 DIAGNOSIS — I1310 Hypertensive heart and chronic kidney disease without heart failure, with stage 1 through stage 4 chronic kidney disease, or unspecified chronic kidney disease: Secondary | ICD-10-CM

## 2016-12-24 DIAGNOSIS — Z66 Do not resuscitate: Secondary | ICD-10-CM | POA: Diagnosis present

## 2016-12-24 DIAGNOSIS — Z89421 Acquired absence of other right toe(s): Secondary | ICD-10-CM

## 2016-12-24 DIAGNOSIS — F40240 Claustrophobia: Secondary | ICD-10-CM | POA: Diagnosis present

## 2016-12-24 DIAGNOSIS — Z7189 Other specified counseling: Secondary | ICD-10-CM | POA: Diagnosis not present

## 2016-12-24 DIAGNOSIS — B9620 Unspecified Escherichia coli [E. coli] as the cause of diseases classified elsewhere: Secondary | ICD-10-CM | POA: Diagnosis present

## 2016-12-24 DIAGNOSIS — R34 Anuria and oliguria: Secondary | ICD-10-CM

## 2016-12-24 DIAGNOSIS — I2510 Atherosclerotic heart disease of native coronary artery without angina pectoris: Secondary | ICD-10-CM | POA: Diagnosis not present

## 2016-12-24 DIAGNOSIS — E861 Hypovolemia: Secondary | ICD-10-CM | POA: Diagnosis present

## 2016-12-24 DIAGNOSIS — Z794 Long term (current) use of insulin: Secondary | ICD-10-CM

## 2016-12-24 DIAGNOSIS — Z951 Presence of aortocoronary bypass graft: Secondary | ICD-10-CM

## 2016-12-24 DIAGNOSIS — N179 Acute kidney failure, unspecified: Secondary | ICD-10-CM | POA: Diagnosis present

## 2016-12-24 DIAGNOSIS — N281 Cyst of kidney, acquired: Secondary | ICD-10-CM | POA: Diagnosis not present

## 2016-12-24 DIAGNOSIS — J189 Pneumonia, unspecified organism: Secondary | ICD-10-CM | POA: Diagnosis present

## 2016-12-24 DIAGNOSIS — Z79899 Other long term (current) drug therapy: Secondary | ICD-10-CM

## 2016-12-24 DIAGNOSIS — Z89432 Acquired absence of left foot: Secondary | ICD-10-CM

## 2016-12-24 DIAGNOSIS — J90 Pleural effusion, not elsewhere classified: Secondary | ICD-10-CM | POA: Diagnosis not present

## 2016-12-24 DIAGNOSIS — R140 Abdominal distension (gaseous): Secondary | ICD-10-CM

## 2016-12-24 DIAGNOSIS — K219 Gastro-esophageal reflux disease without esophagitis: Secondary | ICD-10-CM | POA: Diagnosis present

## 2016-12-24 DIAGNOSIS — Z9049 Acquired absence of other specified parts of digestive tract: Secondary | ICD-10-CM

## 2016-12-24 DIAGNOSIS — Z993 Dependence on wheelchair: Secondary | ICD-10-CM

## 2016-12-24 LAB — URINALYSIS, ROUTINE W REFLEX MICROSCOPIC
BILIRUBIN URINE: NEGATIVE
Bacteria, UA: NONE SEEN
GLUCOSE, UA: 150 mg/dL — AB
HGB URINE DIPSTICK: NEGATIVE
KETONES UR: NEGATIVE mg/dL
Nitrite: NEGATIVE
Protein, ur: 100 mg/dL — AB
Specific Gravity, Urine: 1.013 (ref 1.005–1.030)
pH: 5 (ref 5.0–8.0)

## 2016-12-24 LAB — HEPATIC FUNCTION PANEL
ALK PHOS: 115 U/L (ref 38–126)
ALT: 15 U/L (ref 14–54)
AST: 24 U/L (ref 15–41)
Albumin: 3.1 g/dL — ABNORMAL LOW (ref 3.5–5.0)
BILIRUBIN INDIRECT: 0.7 mg/dL (ref 0.3–0.9)
BILIRUBIN TOTAL: 1.1 mg/dL (ref 0.3–1.2)
Bilirubin, Direct: 0.4 mg/dL (ref 0.1–0.5)
TOTAL PROTEIN: 7.3 g/dL (ref 6.5–8.1)

## 2016-12-24 LAB — CBC
HCT: 44.5 % (ref 36.0–46.0)
Hemoglobin: 14.6 g/dL (ref 12.0–15.0)
MCH: 30.4 pg (ref 26.0–34.0)
MCHC: 32.8 g/dL (ref 30.0–36.0)
MCV: 92.5 fL (ref 78.0–100.0)
PLATELETS: 152 10*3/uL (ref 150–400)
RBC: 4.81 MIL/uL (ref 3.87–5.11)
RDW: 15.7 % — AB (ref 11.5–15.5)
WBC: 5.7 10*3/uL (ref 4.0–10.5)

## 2016-12-24 LAB — BASIC METABOLIC PANEL
Anion gap: 12 (ref 5–15)
BUN: 80 mg/dL — AB (ref 6–20)
CALCIUM: 9.1 mg/dL (ref 8.9–10.3)
CO2: 23 mmol/L (ref 22–32)
CREATININE: 2.2 mg/dL — AB (ref 0.44–1.00)
Chloride: 97 mmol/L — ABNORMAL LOW (ref 101–111)
GFR calc non Af Amer: 21 mL/min — ABNORMAL LOW (ref >60)
GFR, EST AFRICAN AMERICAN: 25 mL/min — AB (ref >60)
GLUCOSE: 369 mg/dL — AB (ref 65–99)
Potassium: 4.4 mmol/L (ref 3.5–5.1)
Sodium: 132 mmol/L — ABNORMAL LOW (ref 135–145)

## 2016-12-24 LAB — D-DIMER, QUANTITATIVE: D-Dimer, Quant: 2.2 ug/mL-FEU — ABNORMAL HIGH (ref 0.00–0.50)

## 2016-12-24 LAB — MAGNESIUM: Magnesium: 2.2 mg/dL (ref 1.7–2.4)

## 2016-12-24 LAB — TROPONIN I: Troponin I: 0.06 ng/mL (ref <0.03)

## 2016-12-24 LAB — GLUCOSE, CAPILLARY: Glucose-Capillary: 127 mg/dL — ABNORMAL HIGH (ref 65–99)

## 2016-12-24 LAB — BRAIN NATRIURETIC PEPTIDE: B Natriuretic Peptide: 706 pg/mL — ABNORMAL HIGH (ref 0.0–100.0)

## 2016-12-24 MED ORDER — SODIUM CHLORIDE 0.9 % IV SOLN
INTRAVENOUS | Status: DC
  Administered 2016-12-24 – 2016-12-26 (×2): via INTRAVENOUS

## 2016-12-24 MED ORDER — GUAIFENESIN ER 600 MG PO TB12
600.0000 mg | ORAL_TABLET | Freq: Two times a day (BID) | ORAL | Status: DC
  Administered 2016-12-26 – 2016-12-30 (×4): 600 mg via ORAL
  Filled 2016-12-24 (×12): qty 1

## 2016-12-24 MED ORDER — INSULIN ASPART 100 UNIT/ML ~~LOC~~ SOLN
0.0000 [IU] | SUBCUTANEOUS | Status: DC
  Administered 2016-12-24: 1 [IU] via SUBCUTANEOUS

## 2016-12-24 MED ORDER — ENOXAPARIN SODIUM 30 MG/0.3ML ~~LOC~~ SOLN: 30.0000 mg | SUBCUTANEOUS | Status: DC

## 2016-12-24 MED ORDER — INSULIN DETEMIR 100 UNIT/ML ~~LOC~~ SOLN
12.0000 [IU] | Freq: Two times a day (BID) | SUBCUTANEOUS | Status: DC
  Administered 2016-12-24: 12 [IU] via SUBCUTANEOUS
  Filled 2016-12-24 (×3): qty 0.12

## 2016-12-24 MED ORDER — ONDANSETRON HCL 4 MG/2ML IJ SOLN: 4.0000 mg | Freq: Four times a day (QID) | INTRAMUSCULAR | Status: DC | PRN

## 2016-12-24 MED ORDER — ACETAMINOPHEN 650 MG RE SUPP: 650.0000 mg | Freq: Four times a day (QID) | RECTAL | Status: DC | PRN

## 2016-12-24 MED ORDER — LEVALBUTEROL HCL 0.63 MG/3ML IN NEBU
0.6300 mg | INHALATION_SOLUTION | Freq: Four times a day (QID) | RESPIRATORY_TRACT | Status: DC | PRN
Start: 2016-12-24 — End: 2017-01-07

## 2016-12-24 MED ORDER — LEVOTHYROXINE SODIUM 100 MCG PO TABS
100.0000 ug | ORAL_TABLET | Freq: Every day | ORAL | Status: DC
  Administered 2016-12-25: 100 ug via ORAL
  Filled 2016-12-24: qty 1

## 2016-12-24 MED ORDER — SODIUM CHLORIDE 0.9 % IV SOLN
INTRAVENOUS | Status: DC
  Administered 2016-12-24: 22:00:00 via INTRAVENOUS

## 2016-12-24 MED ORDER — ACETAMINOPHEN 325 MG PO TABS
650.0000 mg | ORAL_TABLET | Freq: Four times a day (QID) | ORAL | Status: DC | PRN
  Administered 2016-12-25: 650 mg via ORAL
  Filled 2016-12-24: qty 2

## 2016-12-24 MED ORDER — SODIUM CHLORIDE 0.9% FLUSH
3.0000 mL | Freq: Two times a day (BID) | INTRAVENOUS | Status: DC
  Administered 2016-12-24 – 2016-12-25 (×2): 3 mL via INTRAVENOUS
  Administered 2016-12-25: 10 mL via INTRAVENOUS
  Administered 2016-12-26 – 2017-01-05 (×20): 3 mL via INTRAVENOUS

## 2016-12-24 MED ORDER — HYDROCODONE-ACETAMINOPHEN 5-325 MG PO TABS: 1.0000 | ORAL_TABLET | ORAL | Status: DC | PRN

## 2016-12-24 MED ORDER — ASPIRIN EC 325 MG PO TBEC
325.0000 mg | DELAYED_RELEASE_TABLET | Freq: Every day | ORAL | Status: DC
  Administered 2016-12-25 – 2016-12-26 (×2): 325 mg via ORAL
  Filled 2016-12-24 (×2): qty 1

## 2016-12-24 MED ORDER — DOXYCYCLINE HYCLATE 100 MG IV SOLR
100.0000 mg | Freq: Two times a day (BID) | INTRAVENOUS | Status: DC
  Administered 2016-12-24: 100 mg via INTRAVENOUS
  Filled 2016-12-24 (×2): qty 100

## 2016-12-24 MED ORDER — ACETAMINOPHEN 500 MG PO TABS
1000.0000 mg | ORAL_TABLET | Freq: Once | ORAL | Status: AC
  Administered 2016-12-24: 1000 mg via ORAL
  Filled 2016-12-24: qty 2

## 2016-12-24 MED ORDER — ATORVASTATIN CALCIUM 40 MG PO TABS
40.0000 mg | ORAL_TABLET | Freq: Every day | ORAL | Status: DC
  Administered 2016-12-24 – 2017-01-04 (×9): 40 mg via ORAL
  Filled 2016-12-24 (×10): qty 1

## 2016-12-24 MED ORDER — ONDANSETRON HCL 4 MG PO TABS: 4.0000 mg | ORAL_TABLET | Freq: Four times a day (QID) | ORAL | Status: DC | PRN

## 2016-12-24 MED ORDER — DEXTROSE 5 % IV SOLN
1.0000 g | Freq: Three times a day (TID) | INTRAVENOUS | Status: DC
  Administered 2016-12-24 – 2016-12-25 (×2): 1 g via INTRAVENOUS
  Filled 2016-12-24 (×3): qty 1

## 2016-12-24 MED ORDER — ISOSORBIDE MONONITRATE ER 30 MG PO TB24
30.0000 mg | ORAL_TABLET | Freq: Every day | ORAL | Status: DC
  Administered 2016-12-24 – 2016-12-25 (×2): 30 mg via ORAL
  Filled 2016-12-24 (×3): qty 1

## 2016-12-24 NOTE — ED Notes (Signed)
UA collected. Pt remains on toilet for BM

## 2016-12-24 NOTE — Telephone Encounter (Signed)
Pt recently finished 10 day course of Doxycycline 100 mg BID for an ulcer on her knee.  Called [facility] to see if they had results from lab work from last week, they do not have any recent labs.  I am unsure where this was processed and am waiting for the home care nurse to call back.  Advised patient that with reported increase in her creatinine of 2.2 on 3/7 and pt reporting decreased uop she will need to be evaluated urgently and recommended she be seen in urgent care or the [facility] ER.  Pt is reluctant but agreeable.  She has someone to drive her and will report to the ER.   Left a message for Wannetta Sender, cardmaster to make her aware pt is coming.

## 2016-12-24 NOTE — ED Notes (Addendum)
Pt wheeled to bathroom in pt room. Asked pt if she would want me to stay with her pt sts " I think I will be ok, I will pull the button when I am done. I will sit here for a minute." -- hat placed in toilet bowl to collect UA.

## 2016-12-24 NOTE — ED Provider Notes (Signed)
[facility] DEPT Provider Note   CSN: [id number] Arrival date & time: 12/31/2016  1449     History   Chief Complaint Chief Complaint  Patient presents with  . Dysuria    HPI [name] is a 71 y.o. female.  HPI 71 year old female with past medical history as below including aortic stenosis, COPD, CHF, CK D, who presents with worsening renal function. Patient states that for the last several weeks, she has felt generally fatigued. She had a flu shot on 2/26. She then developed fever and flulike illness for several days. Since then, she has not significantly felt better. She endorses generalized fatigue as well as shortness of breath. Over the last 2 weeks, she has also noticed decreased urinary frequency as well as lower extremity swelling. She also endorses orthopnea. She had lab work done by home health nurse last week, which showed a significant increase in her creatinine. She subsequent advised to present to the ED today. She does state she has increased her torsemide recently from 40 mg to 60 mg every other day with 40 mg in between. She denies any fevers or chills. She does have chronic leg swelling but states this has worsened as well.   Past Medical History:  Diagnosis Date  . Anemia   . Anginal pain ([place])    "history; not currently" (08/28/2012)  . Aortic stenosis    0.8cm2 on echo in 10/2011  . Arthritis    "hands, hips, all over" (08/28/2012  . B12 deficiency anemia    B 12 injection "monthly since 05/2012" (08/28/2012)  . Bilateral carpal tunnel syndrome 1970s  . Blood transfusion    no reaction from transfusion; "when I had heart surgery" (08/28/2012)  . Breast cancer ([place])    left  . CAD (coronary artery disease)    s/p CABG x3 in 1993; last cath in 2010  . Cat bite 2009   with MRSA; required I & D  . Cellulitis   . Cellulitis June 2013, Nov. 2013 and Feb. 14, 2014   Left leg  . CHF (congestive heart failure) ([place]) Aug. 2012   Echo 10/2011: Mild LVH,  EF 30%, apex akinetic, septal HK, grade 2 diastolic dysfunction, or functionally bicuspid aortic valve, mild aortic stenosis by gradient, severe by calculated AVA-suspect A. Aortic stenosis probably moderate, trivial MR, mild LAE, mild RAE.   [name] Claudication ([place])   . Exertional dyspnea    "because of the heart failure" (08/28/2012)  . GERD (gastroesophageal reflux disease)   . Heart murmur   . History of bronchitis    "I've had it twice in my life" (08/28/2012)  . History of recurrent UTIs   . HTN (hypertension)   . Hyperlipidemia    on Lipitor  . Hypothyroidism   . Iron deficiency anemia 06/13/2012   "iron infusion" (08/28/2012  . Myocardial infarction 1986   "silent"  . Osteoarthritis of both feet   . Osteomyelitis ([place])    left foot  . PVD (peripheral vascular disease) ([place])    Toes amputated from left foot and has had prior bypass on left leg. Followed by Dr. Donnetta Hutching  . Scleroderma ([place])   . Seizure ([place])    ? seizure like event in 2010; workup included stress testing which led to repeat cath.   . Stomach ulcer 1981?   "on Tagament for ~ 1 yr" in 42s  . Type 1 diabetes ([place]) 10/1949    Patient Active Problem List   Diagnosis Date Noted  .  Hyponatremia 12/16/2016  . Acute renal failure (ARF) ([place]) 12/27/2016  . CAP (community acquired pneumonia) 01/12/2017  . DM type 1 causing renal disease ([place]) 12/30/2016  . Cellulitis 12/23/2016  . Microcytosis 07/05/2016  . PAD (peripheral artery disease) ([place]) 03/23/2016  . 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 Pain in limb 03/02/2014  . Skin tear 11/17/2013  . PVD (peripheral vascular disease) ([place]) 09/22/2013  . Aftercare following surgery of the circulatory system, [place] 06/09/2013  . Atherosclerosis of native arteries of the extremities with ulceration(440.23) 06/09/2013  . 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 Diabetic midfoot ulcer in type 1 diabetes mellitus ([place]) 05/22/2013  .  Abnormality of gait 01/21/2013  . CKD (chronic kidney disease), stage III 11/29/2012  . Chronic combined systolic and diastolic heart failure ([place]) 04/24/2012  . Health care maintenance 04/24/2012  . Iron deficiency anemia 04/15/2012  . Vitamin B12 deficiency (dietary) anemia 04/15/2012  . Aortic stenosis 08/22/2011  . ACQUIRED HEMOLYTIC ANEMIA UNSPECIFIED 06/06/2009  . Coronary atherosclerosis 02/03/2009  . CAROTID ARTERY STENOSIS, WITHOUT INFARCTION 02/03/2009  . Peripheral artery disease 02/03/2009  . Unspecified hypothyroidism 02/01/2009  . Dyslipidemia 02/01/2009  . Essential hypertension 02/01/2009  . DM (diabetes mellitus) type I uncontrolled with renal manifestation ([place]) 10/15/1949    Past Surgical History:  Procedure Laterality Date  . AMPUTATION Right 05/27/2013   Procedure: AMPUTATION DIGIT FIFTH TOE;  Surgeon: Rosetta Posner, MD;  Location: [facility] OR;  Service: Vascular;  Laterality: Right;  . AMPUTATION Left 03/21/2016   Procedure: Left Transmetatarsal Amputation;  Surgeon: Newt Minion, MD;  Location: [facility];  Service: Orthopedics;  Laterality: Left;  . APPENDECTOMY  1991  . BREAST BIOPSY    . BREAST BIOPSY WITH SENTINEL LYMPH NODE BIOPSY AND NEEDLE LOCALIZATION  1984  . CARDIAC CATHETERIZATION  2010   LIMA to LAD patent yet with occluded distal LAD after graft insertion & retrograde is occluded. 3-4 septal perforators arise &are patent. SVG to a large marginal patent; SVG presumably to the distal dominant LCX is occluded, moderately high grade stenosis of the AV circumflex, but with distal stenoses involving a severely diseased marginal branch not amenable  to PCI  nor is inferior branch   . CARDIAC CATHETERIZATION  10/2011   "unsuccessful attempt of stenting" (08/28/2012)  . CARDIAC CATHETERIZATION  1992; 09/2011  . CARPAL TUNNEL RELEASE  ~ 1970's   bilaterally  . CATARACT EXTRACTION W/ INTRAOCULAR LENS  IMPLANT, BILATERAL  1990's  . CORONARY ARTERY BYPASS GRAFT  1992   LIMA  to LAD, SVG to distal LCX & SVG to Marginal  . FEMORAL BYPASS     left leg "related to transmetatarsal amputation" (08/28/2012)  . INCISION AND DRAINAGE OF WOUND  ~ 1967   "infection in left foot" (08/28/2012)  . INCISION AND DRAINAGE OF WOUND  2009   "3 ORs on my right hand after cat bite" (08/28/2012)  . LEFT AND RIGHT HEART CATHETERIZATION WITH CORONARY ANGIOGRAM N/A 09/17/2011   Procedure: LEFT AND RIGHT HEART CATHETERIZATION WITH CORONARY ANGIOGRAM;  Surgeon: Hillary Bow, MD;  Location: [facility] CATH LAB;  Service: Cardiovascular;  Laterality: N/A;  . LOWER EXTREMITY ANGIOGRAM N/A 05/25/2013   Procedure: LOWER EXTREMITY ANGIOGRAM POSS INTERVENTION;  Surgeon: Conrad Strum, MD;  Location: [facility] CATH LAB;  Service: Cardiovascular;  Laterality: N/A;  . MASTECTOMY  11/1982   Left Breast  . PERCUTANEOUS CORONARY STENT INTERVENTION (PCI-S) N/A 11/15/2011   Procedure: PERCUTANEOUS CORONARY STENT INTERVENTION (PCI-S);  Surgeon: Hillary Bow, MD;  Location: [facility] CATH LAB;  Service: Cardiovascular;  Laterality: N/A;  . TONSILLECTOMY  1952   "?adenoids"   . TRANSMETATARSAL AMPUTATION  04/2009 - 10/2009   "4 ORs; left foot"  . TRIGGER FINGER RELEASE  1980's   right thumb  . TUBAL LIGATION  1984  . VITRECTOMY  1990's-2004   "both eyes; I've had total of 4"    OB History    No data available       Home Medications    Prior to Admission medications   Medication Sig Start Date End Date Taking? Authorizing Provider  acetaminophen (TYLENOL) 325 MG tablet Take 325-650 mg by mouth every 6 (six) hours as needed (for pain or headaches).   Yes Historical Provider, MD  aspirin EC 325 MG EC tablet Take 325 mg by mouth daily.    Yes Historical Provider, MD  atorvastatin (LIPITOR) 40 MG tablet TAKE 1 TABLET BY MOUTH AT BEDTIME 10/22/16  Yes Fay Records, MD  Cyanocobalamin 1000 MCG/ML KIT Inject 1,000 mcg into the skin every 8 (eight) weeks.    Yes Historical Provider, MD  docusate sodium (COLACE) 50 MG  capsule Take 50 mg by mouth daily.   Yes Historical Provider, MD  hydrOXYzine (ATARAX/VISTARIL) 10 MG tablet Take 1 tablet (10 mg total) by mouth 3 (three) times daily as needed for itching or anxiety. 03/23/16  Yes Ivan Anchors Love, PA-C  insulin detemir (LEVEMIR) 100 UNIT/ML injection Inject 12 Units into the skin 2 (two) times daily. MORNING AND BEDTIME   Yes Historical Provider, MD  insulin lispro (HUMALOG) 100 UNIT/ML injection Inject 1-12 Units into the skin 4 (four) times daily as needed for high blood sugar (sliding scale).    Yes Historical Provider, MD  isosorbide mononitrate (IMDUR) 30 MG 24 hr tablet Take 1 tablet (30 mg total) by mouth daily. 06/06/16  Yes Fay Records, MD  levothyroxine (SYNTHROID, LEVOTHROID) 100 MCG tablet Take 100 mcg by mouth daily before breakfast.  05/19/15  Yes Historical Provider, MD  torsemide (DEMADEX) 20 MG tablet Take 2 tablets (40 mg total) by mouth every morning. 11/02/16  Yes Fay Records, MD    Family History Family History  Problem Relation Age of Onset  . Diabetes Mother   . Stroke Mother   . Hypertension Mother   . Stroke Father   . Heart attack Neg Hx     Social History Social History  Substance Use Topics  . Smoking status: Never Smoker  . Smokeless tobacco: Never Used  . Alcohol use No     Allergies   Cephalexin; Ciprofloxacin; Zyvox [linezolid]; Adhesive [tape]; Bactrim [sulfamethoxazole-trimethoprim]; Daptomycin; and Vancomycin   Review of Systems Review of Systems  Constitutional: Positive for fatigue. Negative for chills and fever.  HENT: Negative for congestion, rhinorrhea and sore throat.   Eyes: Negative for visual disturbance.  Respiratory: Positive for shortness of breath. Negative for cough and wheezing.   Cardiovascular: Positive for leg swelling. Negative for chest pain.  Gastrointestinal: Positive for nausea. Negative for abdominal pain, diarrhea and vomiting.  Genitourinary: Negative for dysuria, flank pain, vaginal  bleeding and vaginal discharge.  Musculoskeletal: Negative for neck pain.  Skin: Negative for rash.  Allergic/Immunologic: Negative for immunocompromised state.  Neurological: Positive for weakness. Negative for syncope and headaches.  Hematological: Does not bruise/bleed easily.  All other systems reviewed and are negative.    Physical Exam Updated Vital Signs BP 127/94   Pulse (!) 123  Temp 98 F (36.7 C) (Oral)   Resp 17   SpO2 96%   Physical Exam  Constitutional: She is oriented to person, place, and time. She appears well-developed and well-nourished. No distress.  HENT:  Head: Normocephalic and atraumatic.  Eyes: Conjunctivae are normal.  Neck: Neck supple.  Cardiovascular: Regular rhythm and normal heart sounds.  Tachycardia present.  Exam reveals no friction rub.   No murmur heard. Pulmonary/Chest: Effort normal and breath sounds normal. No respiratory distress. She has no wheezes. She has no rales.  Abdominal: She exhibits distension. There is no tenderness.  Musculoskeletal: She exhibits edema (2+ pitting).  Neurological: She is alert and oriented to person, place, and time. She exhibits normal muscle tone.  Skin: Skin is warm. Capillary refill takes less than 2 seconds.  Abrasion to bilateral knees, without drainage or induration.  Psychiatric: She has a normal mood and affect.  Nursing note and vitals reviewed.    ED Treatments / Results  Labs (all labs ordered are listed, but only abnormal results are displayed) Labs Reviewed  URINALYSIS, ROUTINE W REFLEX MICROSCOPIC - Abnormal; Notable for the following:       Result Value   APPearance HAZY (*)    Glucose, UA 150 (*)    Protein, ur 100 (*)    Leukocytes, UA SMALL (*)    Squamous Epithelial / LPF 0-5 (*)    Non Squamous Epithelial 0-5 (*)    All other components within normal limits  BASIC METABOLIC PANEL - Abnormal; Notable for the following:    Sodium 132 (*)    Chloride 97 (*)    Glucose, Bld 369  (*)    BUN 80 (*)    Creatinine, Ser 2.20 (*)    GFR calc non Af Amer 21 (*)    GFR calc Af Amer 25 (*)    All other components within normal limits  CBC - Abnormal; Notable for the following:    RDW 15.7 (*)    All other components within normal limits  URINE CULTURE  BRAIN NATRIURETIC PEPTIDE  HEPATIC FUNCTION PANEL  MAGNESIUM  TROPONIN I  TROPONIN I  TROPONIN I  D-DIMER, QUANTITATIVE (NOT AT [facility])  CREATININE, URINE, RANDOM  SODIUM, URINE, RANDOM  OSMOLALITY, URINE    EKG  EKG Interpretation  Date/Time:  Monday December 24 2016 19:38:30 EDT Ventricular Rate:  116 PR Interval:    QRS Duration: 92 QT Interval:  355 QTC Calculation: 494 R Axis:   110 Text Interpretation:  Sinus tachycardia with irregular rate Prolonged PR interval Right axis deviation Low voltage, extremity leads Anteroseptal infarct, old Borderline repolarization abnormality Since last EKG: Rate has significantly increased Sinus tachycardia versus SVT has replaced sinus bradycardia Confirmed by Ellender Hose MD, Lysbeth Galas [phone]) on 12/25/2016 7:43:16 PM       Radiology Dg Chest 2 View  Result Date: 12/26/2016 CLINICAL DATA:  Patient with history of pulmonary edema. EXAM: CHEST  2 VIEW COMPARISON:  Chest radiograph 07/29/2015. FINDINGS: Stable enlarged cardiac and mediastinal contours status post median sternotomy and CABG procedure. Re- demonstrated fractured sternotomy wires. Unchanged blunting of the right costophrenic angle with minimal heterogeneous opacities right lung base. Left lung is clear. Thoracic spine degenerative changes. IMPRESSION: Findings suggestive of moderate right pleural effusion with underlying pulmonary consolidation. Aortic atherosclerosis. Electronically Signed   By: Lovey Newcomer M.D.   On: 12/22/2016 17:52    Procedures Procedures (including critical care time)  Medications Ordered in ED Medications  0.9 %  sodium chloride  infusion ( Intravenous New Bag/Given 12/16/2016 1721)    acetaminophen (TYLENOL) tablet 1,000 mg (1,000 mg Oral Given 01/01/2017 1922)     Initial Impression / Assessment and Plan / ED Course  I have reviewed the triage vital signs and the nursing notes.  Pertinent labs & imaging results that were available during my care of the patient were reviewed by me and considered in my medical decision making (see chart for details).    71 year old female here with lower extremity edema and decreased urine output with elevated creatinine. On arrival, patient is tachycardic and hypertensive. She appears intravascularly dehydrated clinically. Labwork shows markedly elevated BUN and creatinine consistent with acute on chronic kidney injury. She does appear mildly hypervolemic in her legs, but I suspect she is intravascularly dry, likely contribute eating to prerenal azotemia. Will give gentle fluids and consult hospitalist for admission. Patient will likely need both cardiology as well as nephrology consultation as an inpatient.  Final Clinical Impressions(s) / ED Diagnoses   Final diagnoses:  Decreased urination  Acute renal failure superimposed on stage 2 chronic kidney disease, unspecified acute renal failure type [facility])    New Prescriptions New Prescriptions   No medications on file     Duffy Bruce, MD 12/28/2016 2046

## 2016-12-24 NOTE — Telephone Encounter (Signed)
Left message on [name]'s confidential voice mail that we have not received lab results.  Asked her to call back to let us know where the blood was taken to be run so that we can follow up on the results.

## 2016-12-24 NOTE — Telephone Encounter (Signed)
[name] at 12/17/2016 10:10 AM   Status: Signed    New Message   Pt states that she thinks her kidneys are failing her and requests a call back on what to do.     Called patient back.  Advised that I have not seen her lab results yet.   The patient states that her creatinine per the nurse is 2.2.  Pt has concerns about decreased urine output since Saturday.  Sat--Took Torsemide and did not void for 8-9 hours then only once more that night.  Sun--did not void when she woke up at noon--took torsemide--did not void until midnight, then small amount at 2am, none since.

## 2016-12-24 NOTE — Telephone Encounter (Signed)
Received phone call from Feliz Beam, [facility] nurse who is following up on lab work that was drawn 12/18/16.  She had the results on 12/20/16 and called the office and we were to have received the results via fax.     She states the results were faxed to Dr. Vonna Kotyk office.   I advised her that we do not have any faxed results from last week.  RN reports patient's creatinine was 2.25, BUN 76. Sodium 133, Potassium 4.3. .  I provided [phone], she reports she will contact her office and ask them to fax now.  Medical records is out of the building at this time, I am unable to have results scanned until tomorrow.

## 2016-12-24 NOTE — H&P (Signed)
[name] PPJ:[id number] DOB: 05/11/1946 DOA: 12/28/2016     PCP: [name] Agreste, MD   Outpatient Specialists: [facility],   [facility] Patient coming from:   home Lives alone,       Chief Complaint: Decreased urine output  HPI: [name] is a 71 y.o. female with medical history significant of CAD allergic stenosis, diabetes, HLD, PVD, C daily, iron deficiency anemia requiring iron infusions, Systolic CHF B 12 deficiency remote history of breast cancer on the left, COPD    Presented with patient has not been urinating as much since Saturday she called her cardiologist who told her to increase her dose of worrisome in on Sunday she did not urinate at all until mid 9 and only small amount and none since.   Home care nurse obtain the labs and told her that her creatinine is elevated 2.2 at baseline 1.6 She's been fatigued for the past few weeks reports she had a flu shot on 326 and since then have had URI type symptoms with fever and flulike symptoms for several days she never felt better afterwards. She have had some minimal shortness of breath but states has some at baseline. She have had some lower extremity swelling and orthopnea. Reports mild cough no fever for past 2 weeks recently finished doxycycline given for cellulitis Her . She reports trying to drink more fluids. Have tried to eat well.    Lately felt so weak that unable to ambulate needs a wheelchair  Regarding pertinent Chronic problems: Regarding history of CAD patient followed by cardiology s/p CABG- cath in 09/2011 showed patent LIMA to LAD with occlusion of distal LAD; High grade stenosis of nondominant RCA; Signifcant disease of prox LCx; SVG to L PDA occluded, SVG to OM patent. His known history of aortic stenosis,  In October 2012 echogram showed EF 25-30 percent with right ventricle EF also moderately depressed with moderate aortic stenosis.  He has long-standing history of PVD and  diabetes status post amputations requiring bypass in the left leg  IN ER:  Temp (24hrs), Avg:98 F (36.7 C), Min:98 F (36.7 C), Max:98 F (36.7 C)      RR 18 satting 99% HR 126 BP 124/104 Sodium 132 glucose 369 BUN 80 creatinine 2.2 WBC 5.7 hemoglobin 14.6 A 60 worrisome for moderate right pleural effusion with possible pulmonary consolidation.  Following Medications were ordered in ER: Medications  0.9 %  sodium chloride infusion ( Intravenous New Bag/Given 01/12/2017 1721)  acetaminophen (TYLENOL) tablet 1,000 mg (not administered)       Hospitalist was called for admission for Worsening renal function decreased urine output and evidence of fluid overload with possible pneumonia  Review of Systems:    Pertinent positives include: Fevers, chills, Bilateral lower extremity swelling    Constitutional:  No weight loss, night sweats,  fatigue, weight loss  HEENT:  No headaches, Difficulty swallowing,Tooth/dental problems,Sore throat,  No sneezing, itching, ear ache, nasal congestion, post nasal drip,  Cardio-vascular:  No chest pain, Orthopnea, PND, anasarca, dizziness, palpitations.noGI:  No heartburn, indigestion, abdominal pain, nausea, vomiting, diarrhea, change in bowel habits, loss of appetite, melena, blood in stool, hematemesis Resp:  no shortness of breath at rest. No dyspnea on exertion, No excess mucus, no productive cough, No non-productive cough, No coughing up of blood.No change in color of mucus.No wheezing. Skin:  no rash or lesions. No jaundice GU:  no dysuria, change in color of urine, no urgency or frequency. No straining to  urinate.  No flank pain.  Musculoskeletal:  No joint pain or no joint swelling. No decreased range of motion. No back pain.  Psych:  No change in mood or affect. No depression or anxiety. No memory loss.  Neuro: no localizing neurological complaints, no tingling, no weakness, no double vision, no gait abnormality, no slurred speech, no  confusion  As per HPI otherwise 10 point review of systems negative.   Past Medical History: Past Medical History:  Diagnosis Date  . Anemia   . Anginal pain ([place])    "history; not currently" (08/28/2012)  . Aortic stenosis    0.8cm2 on echo in 10/2011  . Arthritis    "hands, hips, all over" (08/28/2012  . B12 deficiency anemia    B 12 injection "monthly since 05/2012" (08/28/2012)  . Bilateral carpal tunnel syndrome 1970s  . Blood transfusion    no reaction from transfusion; "when I had heart surgery" (08/28/2012)  . Breast cancer ([place])    left  . CAD (coronary artery disease)    s/p CABG x3 in 1993; last cath in 2010  . Cat bite 2009   with MRSA; required I & D  . Cellulitis   . Cellulitis June 2013, Nov. 2013 and Feb. 14, 2014   Left leg  . CHF (congestive heart failure) ([place]) Aug. 2012   Echo 10/2011: Mild LVH, EF 30%, apex akinetic, septal HK, grade 2 diastolic dysfunction, or functionally bicuspid aortic valve, mild aortic stenosis by gradient, severe by calculated AVA-suspect A. Aortic stenosis probably moderate, trivial MR, mild LAE, mild RAE.   [name] Claudication ([place])   . Exertional dyspnea    "because of the heart failure" (08/28/2012)  . GERD (gastroesophageal reflux disease)   . Heart murmur   . History of bronchitis    "I've had it twice in my life" (08/28/2012)  . History of recurrent UTIs   . HTN (hypertension)   . Hyperlipidemia    on Lipitor  . Hypothyroidism   . Iron deficiency anemia 06/13/2012   "iron infusion" (08/28/2012  . Myocardial infarction 1986   "silent"  . Osteoarthritis of both feet   . Osteomyelitis ([place])    left foot  . PVD (peripheral vascular disease) ([place])    Toes amputated from left foot and has had prior bypass on left leg. Followed by Dr. Donnetta Hutching  . Scleroderma ([place])   . Seizure ([place])    ? seizure like event in 2010; workup included stress testing which led to repeat cath.   . Stomach ulcer 1981?   "on Tagament for ~ 1 yr" in 41s    . Type 1 diabetes ([place]) 10/1949   Past Surgical History:  Procedure Laterality Date  . AMPUTATION Right 05/27/2013   Procedure: AMPUTATION DIGIT FIFTH TOE;  Surgeon: Rosetta Posner, MD;  Location: [facility] OR;  Service: Vascular;  Laterality: Right;  . AMPUTATION Left 03/21/2016   Procedure: Left Transmetatarsal Amputation;  Surgeon: Newt Minion, MD;  Location: [facility];  Service: Orthopedics;  Laterality: Left;  . APPENDECTOMY  1991  . BREAST BIOPSY    . BREAST BIOPSY WITH SENTINEL LYMPH NODE BIOPSY AND NEEDLE LOCALIZATION  1984  . CARDIAC CATHETERIZATION  2010   LIMA to LAD patent yet with occluded distal LAD after graft insertion & retrograde is occluded. 3-4 septal perforators arise &are patent. SVG to a large marginal patent; SVG presumably to the distal dominant LCX is occluded, moderately high grade stenosis of the AV circumflex, but with distal  stenoses involving a severely diseased marginal branch not amenable  to PCI  nor is inferior branch   . CARDIAC CATHETERIZATION  10/2011   "unsuccessful attempt of stenting" (08/28/2012)  . CARDIAC CATHETERIZATION  1992; 09/2011  . CARPAL TUNNEL RELEASE  ~ 1970's   bilaterally  . CATARACT EXTRACTION W/ INTRAOCULAR LENS  IMPLANT, BILATERAL  1990's  . CORONARY ARTERY BYPASS GRAFT  1992   LIMA to LAD, SVG to distal LCX & SVG to Marginal  . FEMORAL BYPASS     left leg "related to transmetatarsal amputation" (08/28/2012)  . INCISION AND DRAINAGE OF WOUND  ~ 1967   "infection in left foot" (08/28/2012)  . INCISION AND DRAINAGE OF WOUND  2009   "3 ORs on my right hand after cat bite" (08/28/2012)  . LEFT AND RIGHT HEART CATHETERIZATION WITH CORONARY ANGIOGRAM N/A 09/17/2011   Procedure: LEFT AND RIGHT HEART CATHETERIZATION WITH CORONARY ANGIOGRAM;  Surgeon: Hillary Bow, MD;  Location: [facility] CATH LAB;  Service: Cardiovascular;  Laterality: N/A;  . LOWER EXTREMITY ANGIOGRAM N/A 05/25/2013   Procedure: LOWER EXTREMITY ANGIOGRAM POSS INTERVENTION;  Surgeon:  Conrad , MD;  Location: [facility] CATH LAB;  Service: Cardiovascular;  Laterality: N/A;  . MASTECTOMY  11/1982   Left Breast  . PERCUTANEOUS CORONARY STENT INTERVENTION (PCI-S) N/A 11/15/2011   Procedure: PERCUTANEOUS CORONARY STENT INTERVENTION (PCI-S);  Surgeon: Hillary Bow, MD;  Location: [facility] CATH LAB;  Service: Cardiovascular;  Laterality: N/A;  . TONSILLECTOMY  1952   "?adenoids"   . TRANSMETATARSAL AMPUTATION  04/2009 - 10/2009   "4 ORs; left foot"  . TRIGGER FINGER RELEASE  1980's   right thumb  . TUBAL LIGATION  1984  . VITRECTOMY  1990's-2004   "both eyes; I've had total of 4"     Social History:  Ambulatory   wheelchair bound     reports that she has never smoked. She has never used smokeless tobacco. She reports that she does not drink alcohol or use drugs.  Allergies:   Allergies  Allergen Reactions  . Cephalexin Swelling and Rash  . Ciprofloxacin Swelling and Rash  . Zyvox [Linezolid] Other (See Comments)    Thrombocytopenia developed to 50k after several weeks of therapy  . Adhesive [Tape] Other (See Comments)    "old [employer] adhesive tape; takes my skin off; can use paper tape"  . Bactrim [Sulfamethoxazole-Trimethoprim] Other (See Comments)    Hyperkalemia (elevated potassium) and Increased creatinine  . Daptomycin Other (See Comments)    Had elevated CPK on therapy, not clear that this was due to cubicin  . Vancomycin Other (See Comments)    ? If this played role in her Acute kidney injury     Family History:   Family History  Problem Relation Age of Onset  . Diabetes Mother   . Stroke Mother   . Hypertension Mother   . Stroke Father   . Heart attack Neg Hx     Medications: Prior to Admission medications   Medication Sig Start Date End Date Taking? Authorizing Provider  acetaminophen (TYLENOL) 325 MG tablet Take 325-650 mg by mouth every 6 (six) hours as needed (for pain or headaches).   Yes Historical Provider, MD  aspirin EC 325 MG EC  tablet Take 325 mg by mouth daily.    Yes Historical Provider, MD  atorvastatin (LIPITOR) 40 MG tablet TAKE 1 TABLET BY MOUTH AT BEDTIME 10/22/16  Yes Fay Records, MD  Cyanocobalamin 1000 MCG/ML KIT Inject 1,000 mcg  into the skin every 8 (eight) weeks.    Yes Historical Provider, MD  docusate sodium (COLACE) 50 MG capsule Take 50 mg by mouth daily.   Yes Historical Provider, MD  hydrOXYzine (ATARAX/VISTARIL) 10 MG tablet Take 1 tablet (10 mg total) by mouth 3 (three) times daily as needed for itching or anxiety. 03/23/16  Yes Ivan Anchors Love, PA-C  insulin detemir (LEVEMIR) 100 UNIT/ML injection Inject 12 Units into the skin 2 (two) times daily. MORNING AND BEDTIME   Yes Historical Provider, MD  insulin lispro (HUMALOG) 100 UNIT/ML injection Inject 1-12 Units into the skin 4 (four) times daily as needed for high blood sugar (sliding scale).    Yes Historical Provider, MD  isosorbide mononitrate (IMDUR) 30 MG 24 hr tablet Take 1 tablet (30 mg total) by mouth daily. 06/06/16  Yes Fay Records, MD  levothyroxine (SYNTHROID, LEVOTHROID) 100 MCG tablet Take 100 mcg by mouth daily before breakfast.  05/19/15  Yes Historical Provider, MD  torsemide (DEMADEX) 20 MG tablet Take 2 tablets (40 mg total) by mouth every morning. 11/02/16  Yes Fay Records, MD  doxycycline (VIBRA-TABS) 100 MG tablet Take 1 tablet (100 mg total) by mouth 2 (two) times daily. Patient not taking: Reported on 12/21/2016 12/10/16   [name] Agreste, MD    Physical Exam: Patient Vitals for the past 24 hrs:  BP Temp Temp src Pulse Resp SpO2  12/19/2016 1730 (!) 124/104 - - (!) 126 18 99 %  12/23/2016 1718 136/90 - - (!) 124 18 100 %  01/07/2017 1457 135/78 98 F (36.7 C) Oral (!) 122 19 100 %    1. General:  in No Acute distress 2. Psychological: Alert and   Oriented 3. Head/ENT:    Dry Mucous Membranes                          Head Non traumatic, neck supple                            Poor Dentition 4. SKIN:  decreased Skin turgor,  Skin  clean Dry redness  Bilaterally,  Right knee with skin breakdown 5. Heart: Regular rate and rhythm   Murmur, Rub or gallop 6. Lungs:  no wheezes some crackles   7. Abdomen: Soft,  non-tender, Non distended 8. Lower extremities: no clubbing, cyanosis, 2+ edema, Woody apearness, edema up to the thighs 9. Neurologically Grossly intact, moving all 4 extremities equally  10. MSK: Normal range of motion   body mass index is unknown because there is no height or weight on file.  Labs on Admission:   Labs on Admission: I have personally reviewed following labs and imaging studies  CBC:  Recent Labs Lab 12/25/2016 1505  WBC 5.7  HGB 14.6  HCT 44.5  MCV 92.5  PLT 161   Basic Metabolic Panel:  Recent Labs Lab 12/27/2016 1505  NA 132*  K 4.4  CL 97*  CO2 23  GLUCOSE 369*  BUN 80*  CREATININE 2.20*  CALCIUM 9.1   GFR: Estimated Creatinine Clearance: 22.6 mL/min (by C-G formula based on SCr of 2.2 mg/dL (H)). Liver Function Tests: No results for input(s): AST, ALT, ALKPHOS, BILITOT, PROT, ALBUMIN in the last 168 hours. No results for input(s): LIPASE, AMYLASE in the last 168 hours. No results for input(s): AMMONIA in the last 168 hours. Coagulation Profile: No results for input(s): INR, PROTIME  in the last 168 hours. Cardiac Enzymes: No results for input(s): CKTOTAL, CKMB, CKMBINDEX, TROPONINI in the last 168 hours. BNP (last 3 results)  Recent Labs  12/10/16 1431  PROBNP 21,500*   HbA1C: No results for input(s): HGBA1C in the last 72 hours. CBG: No results for input(s): GLUCAP in the last 168 hours. Lipid Profile: No results for input(s): CHOL, HDL, LDLCALC, TRIG, CHOLHDL, LDLDIRECT in the last 72 hours. Thyroid Function Tests: No results for input(s): TSH, T4TOTAL, FREET4, T3FREE, THYROIDAB in the last 72 hours. Anemia Panel: No results for input(s): VITAMINB12, FOLATE, FERRITIN, TIBC, IRON, RETICCTPCT in the last 72 hours. Urine analysis:    Component Value  Date/Time   COLORURINE YELLOW 12/20/2016 1857   APPEARANCEUR HAZY (A) 01/07/2017 1857   LABSPEC 1.013 12/22/2016 1857   PHURINE 5.0 12/15/2016 1857   GLUCOSEU 150 (A) 12/21/2016 1857   HGBUR NEGATIVE 01/11/2017 1857   BILIRUBINUR NEGATIVE 12/22/2016 1857   KETONESUR NEGATIVE 01/02/2017 1857   PROTEINUR 100 (A) 01/05/2017 1857   UROBILINOGEN 1.0 05/31/2013 0612   NITRITE NEGATIVE 12/27/2016 1857   LEUKOCYTESUR SMALL (A) 01/07/2017 1857   Sepsis Labs: _0 (procalcitonin:4,lacticidven:4) )No results found for this or any previous visit (from the past 240 hour(s)).     UA  b no evidence of UTI    Lab Results  Component Value Date   HGBA1C 10.0 11/17/2013    Estimated Creatinine Clearance: 22.6 mL/min (by C-G formula based on SCr of 2.2 mg/dL (H)).  BNP (last 3 results)  Recent Labs  12/10/16 1431  PROBNP 21,500*     ECG REPORT  Independently reviewed Rate:  116  Rhythm: Prolonged pr sinus ST&T Change: No acute ischemic changes   QTC 494  There were no vitals filed for this visit.   Cultures:    Component Value Date/Time   SDES BLOOD LEFT HAND 05/23/2013 0015   SPECREQUEST BOTTLES DRAWN AEROBIC ONLY 2CC 05/23/2013 0015   CULT  05/23/2013 0015    NO GROWTH 5 DAYS Performed at [facility] 05/29/2013 FINAL 05/23/2013 0015     Radiological Exams on Admission: Dg Chest 2 View  Result Date: 01/05/2017 CLINICAL DATA:  Patient with history of pulmonary edema. EXAM: CHEST  2 VIEW COMPARISON:  Chest radiograph 07/29/2015. FINDINGS: Stable enlarged cardiac and mediastinal contours status post median sternotomy and CABG procedure. Re- demonstrated fractured sternotomy wires. Unchanged blunting of the right costophrenic angle with minimal heterogeneous opacities right lung base. Left lung is clear. Thoracic spine degenerative changes. IMPRESSION: Findings suggestive of moderate right pleural effusion with underlying pulmonary consolidation. Aortic  atherosclerosis. Electronically Signed   By: Lovey Newcomer M.D.   On: 01/05/2017 17:52    Chart has been reviewed    Assessment/Plan   71 y.o. female with medical history significant of CAD allergic stenosis, diabetes, HLD, PVD, C daily, iron deficiency anemia requiring iron infusions, Systolic CHF B 12 deficiency remote history of breast cancer on the left, COPD admitted for ARF decreased urine output.  Present on Admission:  . Acute renal failure (ARF) (HCC) - spoke to nephrology will see in AM, given dry mucous membranes and tachycardia worse with sitting up presume intravascularly depleted hold torsemide for now and give gentle fluids, obtain Renal US. History of systolic heart failure - difficult to determine fluid status patient has known history of right heart failure will obtain echogram hold torsemide for tonight patient benefits from cardiology consult Dyspnea. Possibly secondary to pneumonia given tachycardia will obtain  VQ scan cycle cardiac enzymes . Coronary atherosclerosis - stable continue home medications . CAP (community acquired pneumonia) - Does not meet criteria for sepsis Will initiate antibiotics given multiple allergies treat with aztreonam, add doxycycline for broader coverage . CKD (chronic kidney disease), stage III - Cr up from baseline, appreciate nephrology consult . Essential hypertension - stable continue to monitor . DM (diabetes mellitus) type I uncontrolled with renal manifestation ([place]) - continue home medications, diabetes coordinator consult . PVD (peripheral vascular disease) ([place]) stable continue home medications . Cellulitis patient states that somewhat improved test for MRSA. See if improves with IV antibiotics, recent culture showing staph aureus showing to be sensitive to oxacillin . Hyponatremia - obtain urine electrolytes.  Other plan as per orders.  DVT prophylaxis:   Lovenox     Code Status:  FULL CODE  as per patient     Family  Communication:   Family not at  Bedside    Disposition Plan:    likely will need placement for rehabilitation                      Would benefit from PT/OT eval prior to DC ordered                        Diabetes coordinator  Nutrition    consulted                          Consults called: Nephrology, emailed cardiology   Admission status:   Inpatient    Level of care     tele      I have spent a total of 67 min on this admission   extra time was spent to discuss case with nephrology and pharmacy  Reese 12/15/2016, 9:08 PM   [facility]  Pager [phone]   after 2 AM please page floor coverage PA If 7AM-7PM, please contact the day team taking care of the patient  [email or web address]  Password [email or web address]

## 2016-12-24 NOTE — ED Triage Notes (Signed)
Pt reports significant rise in her creatnine levels from labs her regular doctor obtained, states she has also been urinating very infrequently despite taking her lasix, reports approx 4 episodes of urination since Saturday.

## 2016-12-24 NOTE — Progress Notes (Signed)
CRITICAL VALUE ALERT  Critical value received:  Troponin 0.06  Date of notification: 12/16/2016  Time of notification:  2120  Critical value read back:Yes.    Nurse who received alert:  Lorenso Courier  MD notified (1st page):  Baltazar Najjar  Time of first page:  2124  MD notified (2nd page):Kirby  Time of second page:2150  Responding MD:  Baltazar Najjar, NP  Time MD responded:  2200

## 2016-12-24 NOTE — Telephone Encounter (Signed)
New Message   Pt states that she thinks her kidneys are failing her and requests a call back on what to do.

## 2016-12-24 NOTE — ED Notes (Signed)
No bladder scanner available at this time.

## 2016-12-24 NOTE — ED Notes (Signed)
Pt states she cannot urinate at this time.

## 2016-12-25 ENCOUNTER — Inpatient Hospital Stay (HOSPITAL_COMMUNITY): Payer: Medicare Other

## 2016-12-25 DIAGNOSIS — I2510 Atherosclerotic heart disease of native coronary artery without angina pectoris: Secondary | ICD-10-CM

## 2016-12-25 DIAGNOSIS — I10 Essential (primary) hypertension: Secondary | ICD-10-CM

## 2016-12-25 DIAGNOSIS — I480 Paroxysmal atrial fibrillation: Secondary | ICD-10-CM

## 2016-12-25 DIAGNOSIS — R0600 Dyspnea, unspecified: Secondary | ICD-10-CM

## 2016-12-25 DIAGNOSIS — I739 Peripheral vascular disease, unspecified: Secondary | ICD-10-CM

## 2016-12-25 LAB — COMPREHENSIVE METABOLIC PANEL
ALBUMIN: 2.9 g/dL — AB (ref 3.5–5.0)
ALT: 13 U/L — ABNORMAL LOW (ref 14–54)
ANION GAP: 12 (ref 5–15)
AST: 23 U/L (ref 15–41)
Alkaline Phosphatase: 104 U/L (ref 38–126)
BILIRUBIN TOTAL: 1.1 mg/dL (ref 0.3–1.2)
BUN: 76 mg/dL — ABNORMAL HIGH (ref 6–20)
CHLORIDE: 99 mmol/L — AB (ref 101–111)
CO2: 22 mmol/L (ref 22–32)
Calcium: 8.9 mg/dL (ref 8.9–10.3)
Creatinine, Ser: 1.99 mg/dL — ABNORMAL HIGH (ref 0.44–1.00)
GFR calc Af Amer: 28 mL/min — ABNORMAL LOW (ref >60)
GFR calc non Af Amer: 24 mL/min — ABNORMAL LOW (ref >60)
Glucose, Bld: 134 mg/dL — ABNORMAL HIGH (ref 65–99)
POTASSIUM: 3.9 mmol/L (ref 3.5–5.1)
Sodium: 133 mmol/L — ABNORMAL LOW (ref 135–145)
TOTAL PROTEIN: 6.5 g/dL (ref 6.5–8.1)

## 2016-12-25 LAB — MRSA PCR SCREENING: MRSA BY PCR: NEGATIVE

## 2016-12-25 LAB — ECHOCARDIOGRAM COMPLETE

## 2016-12-25 LAB — GLUCOSE, RANDOM: GLUCOSE: 136 mg/dL — AB (ref 65–99)

## 2016-12-25 LAB — SODIUM, URINE, RANDOM: Sodium, Ur: <10 mmol/L

## 2016-12-25 LAB — CBC
HEMATOCRIT: 43.8 % (ref 36.0–46.0)
HEMOGLOBIN: 14.2 g/dL (ref 12.0–15.0)
MCH: 29.8 pg (ref 26.0–34.0)
MCHC: 32.4 g/dL (ref 30.0–36.0)
MCV: 92 fL (ref 78.0–100.0)
Platelets: 147 10*3/uL — ABNORMAL LOW (ref 150–400)
RBC: 4.76 MIL/uL (ref 3.87–5.11)
RDW: 15.7 % — ABNORMAL HIGH (ref 11.5–15.5)
WBC: 6.5 10*3/uL (ref 4.0–10.5)

## 2016-12-25 LAB — GLUCOSE, CAPILLARY
GLUCOSE-CAPILLARY: 115 mg/dL — AB (ref 65–99)
GLUCOSE-CAPILLARY: 134 mg/dL — AB (ref 65–99)
GLUCOSE-CAPILLARY: 146 mg/dL — AB (ref 65–99)
GLUCOSE-CAPILLARY: 77 mg/dL (ref 65–99)
Glucose-Capillary: 79 mg/dL (ref 65–99)
Glucose-Capillary: 149 mg/dL — ABNORMAL HIGH (ref 65–99)
Glucose-Capillary: 75 mg/dL (ref 65–99)
Glucose-Capillary: 109 mg/dL — ABNORMAL HIGH (ref 65–99)
Glucose-Capillary: 182 mg/dL — ABNORMAL HIGH (ref 65–99)

## 2016-12-25 LAB — STREP PNEUMONIAE URINARY ANTIGEN: Strep Pneumo Urinary Antigen: NEGATIVE

## 2016-12-25 LAB — TROPONIN I
TROPONIN I: 0.06 ng/mL — AB (ref <0.03)
TROPONIN I: 0.06 ng/mL — AB (ref <0.03)

## 2016-12-25 LAB — MAGNESIUM: MAGNESIUM: 2.2 mg/dL (ref 1.7–2.4)

## 2016-12-25 LAB — HEPARIN LEVEL (UNFRACTIONATED): HEPARIN UNFRACTIONATED: 0.51 [IU]/mL (ref 0.30–0.70)

## 2016-12-25 LAB — PHOSPHORUS: PHOSPHORUS: 4.3 mg/dL (ref 2.5–4.6)

## 2016-12-25 LAB — OSMOLALITY, URINE: OSMOLALITY UR: 366 mosm/kg (ref 300–900)

## 2016-12-25 LAB — CREATININE, URINE, RANDOM: CREATININE, URINE: 96.65 mg/dL

## 2016-12-25 LAB — TSH: TSH: 17.998 u[IU]/mL — AB (ref 0.350–4.500)

## 2016-12-25 MED ORDER — CARVEDILOL 12.5 MG PO TABS
12.5000 mg | ORAL_TABLET | Freq: Two times a day (BID) | ORAL | Status: DC
  Administered 2016-12-25: 12.5 mg via ORAL
  Filled 2016-12-25: qty 1

## 2016-12-25 MED ORDER — HEPARIN (PORCINE) IN NACL 100-0.45 UNIT/ML-% IJ SOLN
1000.0000 [IU]/h | INTRAMUSCULAR | Status: DC
  Administered 2016-12-25: 1000 [IU]/h via INTRAVENOUS
  Filled 2016-12-25: qty 250

## 2016-12-25 MED ORDER — DEXTROSE 50 % IV SOLN
INTRAVENOUS | Status: AC
  Filled 2016-12-25: qty 50

## 2016-12-25 MED ORDER — DEXTROSE 50 % IV SOLN
INTRAVENOUS | Status: AC
  Administered 2016-12-25: 25 mL
  Filled 2016-12-25: qty 50

## 2016-12-25 MED ORDER — DEXTROSE 50 % IV SOLN
25.0000 mL | Freq: Once | INTRAVENOUS | Status: AC
  Administered 2016-12-25: 25 mL via INTRAVENOUS

## 2016-12-25 MED ORDER — AMIODARONE HCL IN DEXTROSE 360-4.14 MG/200ML-% IV SOLN
60.0000 mg/h | INTRAVENOUS | Status: DC
  Administered 2016-12-25: 60 mg/h via INTRAVENOUS
  Filled 2016-12-25 (×3): qty 200

## 2016-12-25 MED ORDER — AMIODARONE HCL 200 MG PO TABS
400.0000 mg | ORAL_TABLET | Freq: Two times a day (BID) | ORAL | Status: DC
  Administered 2016-12-25: 400 mg via ORAL
  Filled 2016-12-25: qty 2

## 2016-12-25 MED ORDER — SENNOSIDES-DOCUSATE SODIUM 8.6-50 MG PO TABS
1.0000 | ORAL_TABLET | Freq: Every day | ORAL | Status: DC
  Administered 2016-12-26 – 2016-12-28 (×2): 1 via ORAL
  Filled 2016-12-25 (×10): qty 1

## 2016-12-25 MED ORDER — ALPRAZOLAM 0.25 MG PO TABS
0.1250 mg | ORAL_TABLET | Freq: Once | ORAL | Status: AC
  Administered 2016-12-25: 0.125 mg via ORAL
  Filled 2016-12-25: qty 1

## 2016-12-25 MED ORDER — AMIODARONE LOAD VIA INFUSION
150.0000 mg | Freq: Once | INTRAVENOUS | Status: AC
  Administered 2016-12-25: 150 mg via INTRAVENOUS
  Filled 2016-12-25: qty 83.34

## 2016-12-25 MED ORDER — INSULIN DETEMIR 100 UNIT/ML ~~LOC~~ SOLN
8.0000 [IU] | Freq: Two times a day (BID) | SUBCUTANEOUS | Status: DC
  Filled 2016-12-25 (×3): qty 0.08

## 2016-12-25 MED ORDER — AMIODARONE HCL IN DEXTROSE 360-4.14 MG/200ML-% IV SOLN
30.0000 mg/h | INTRAVENOUS | Status: DC
  Administered 2016-12-25: 60 mg/h via INTRAVENOUS
  Filled 2016-12-25: qty 200

## 2016-12-25 MED ORDER — HEPARIN BOLUS VIA INFUSION
2500.0000 [IU] | Freq: Once | INTRAVENOUS | Status: AC
  Administered 2016-12-25: 2500 [IU] via INTRAVENOUS
  Filled 2016-12-25: qty 2500

## 2016-12-25 MED ORDER — DEXTROSE 50 % IV SOLN
INTRAVENOUS | Status: AC
  Administered 2016-12-25: 50 mL
  Filled 2016-12-25: qty 50

## 2016-12-25 MED ORDER — INSULIN ASPART 100 UNIT/ML ~~LOC~~ SOLN
0.0000 [IU] | Freq: Three times a day (TID) | SUBCUTANEOUS | Status: DC
  Administered 2016-12-25: 2 [IU] via SUBCUTANEOUS

## 2016-12-25 NOTE — Progress Notes (Signed)
Blood sugar 75, patient stated that is low for her and requested dextrose. Same given. Will continue to monitor

## 2016-12-25 NOTE — Progress Notes (Signed)
Inpatient Diabetes Program Recommendations  AACE/ADA: New Consensus Statement on Inpatient Glycemic Control (2015)  Target Ranges:  Prepandial:   less than 140 mg/dL      Peak postprandial:   less than 180 mg/dL (1-2 hours)      Critically ill patients:  140 - 180 mg/dL   Lab Results  Component Value Date   GLUCAP 149 (H) 12/25/2016   HGBA1C 10.0 11/17/2013    Review of Glycemic Control:  Results for REAGEN, GOATES (MRN [id number]) as of 12/25/2016 07:47  Ref. Range 12/25/2016 00:22 12/25/2016 04:36 12/25/2016 05:20 12/25/2016 06:47 12/25/2016 07:08  Glucose-Capillary Latest Ref Range: 65 - 99 mg/dL 146 (H) 79 115 (H) 77 149 (H)   Diabetes history: Type 1 diabetes since 1951 Outpatient Diabetes medications: Levemir 12 units bid, Humalog 1-12 units 4 times a day Current orders for Inpatient glycemic control:  Novolog sensitive q 4 hours, Levemir 12 units bid  Inpatient Diabetes Program Recommendations:    Blood sugars less than 80 mg/dL this morning and patient symptomatic.  NPO this morning.  May consider reducing Levemir to 8 units bid.  Also may need less aggressive correction q 4 hours that starts at 151 mg/dL.   (151-200 mg/dL-1 unit, 201-250 mg/dL-2 units, 251-300 mg/dL-3 units, 301-350 mg/dl-4 units, 351-400 mg/dL- 5 units).  Once patient is eating, she will also need the addition of meal coverage.    Will follow.  Thanks, [name] Perl, RN, BC-ADM Inpatient Diabetes Coordinator Pager [phone] (8a-5p)

## 2016-12-25 NOTE — Progress Notes (Signed)
Pt refuse to complete IV dose of Doxycycline, she stated med burns and stings even when it is running with [name], NP notified.

## 2016-12-25 NOTE — Progress Notes (Signed)
Pt stated she does not feel good if CBG less than 100. Pt NPO CBG 79, pt feels shaky. Baltazar Najjar, NP paged.

## 2016-12-25 NOTE — Evaluation (Signed)
Occupational Therapy Evaluation Patient Details Name: SWEETIE GIEBLER MRN: [id number] DOB: [birth year] Today's Date: 12/25/2016    History of Present Illness Pt is a 71 y.o. female who presented to the ED with edema, concerns of decreased urine output, and creatinine elevation. Additionally she reports a "flulike reaction" following influenza vaccination a few days prior to this. She had developed wounds on B knees. She presented with shortness of breath and weakness. PMH significant for: anemia, anginal pain, aortic stenosis, arthritis, B12 deficiency anemia, B carpal tunnel syndrome, blood transfusion, breast cancer, CAD, CHF, cellulitis, claudication, exertional dyspnea, GERD, heart murmur, HTN, hyperlipidemia, hypothyroidism, iron deficiency anemia, myocardial infarction, osteomyelitis L foot, PVD, scleroderma, seizure, stomach ulcer, type 1 DM.   Clinical Impression   PTA, pt was independent with assistive devices and compensatory strategies for basic ADL tasks. She currently requires min guard assist for toilet transfers and LB ADL. She presents with significantly decreased activity tolerance for ADL requiring multiple therapeutic rest breaks during grooming tasks on evaluation. Pt reporting shortness of breath that resolves with rest breaks (HR 120, SpO2 98%). She would benefit from continued OT services while admitted to improve independence with ADL and continue education concerning energy conservation strategies. Feel pt will likely progress to be able to return home with assistance from family and no OT follow-up as she has compensatory strategies in place at baseline. Will continue to follow acutely.    Follow Up Recommendations  No OT follow up;Supervision/Assistance - 24 hour    Equipment Recommendations  None recommended by OT (Has needs met)    Recommendations for Other Services       Precautions / Restrictions Precautions Precautions: Fall Restrictions Weight Bearing  Restrictions: No      Mobility Bed Mobility               General bed mobility comments: Sitting at EOB on arrival.  Transfers Overall transfer level: Needs assistance Equipment used: None Transfers: Squat Pivot Transfers     Squat pivot transfers: Min guard     General transfer comment: Squat pivot from EOB to w/c with min guard assist.    Balance Overall balance assessment: Needs assistance Sitting-balance support: No upper extremity supported;Feet supported Sitting balance-Leahy Scale: Good     Standing balance support: Bilateral upper extremity supported;During functional activity Standing balance-Leahy Scale: Poor                              ADL Overall ADL's : Needs assistance/impaired Eating/Feeding: Set up;Sitting   Grooming: Set up;Sitting;Oral care   Upper Body Bathing: Set up;Sitting   Lower Body Bathing: Minimal assistance;Sitting/lateral leans   Upper Body Dressing : Set up;Sitting   Lower Body Dressing: Minimal assistance;Sitting/lateral leans   Toilet Transfer: Min guard;Squat-pivot;BSC   Toileting- Clothing Manipulation and Hygiene: Set up;Sitting/lateral lean   Tub/ Shower Transfer: Min guard;Squat-pivot;Shower Scientist, research (medical) Details (indicate cue type and reason): Squat pivot transfer from w/c to shower seat. Functional mobility during ADLs: Wheelchair General ADL Comments: Pt utilizes various compensatory strategies in her home at baseline. Decreased dressing independence and activity tolerance for ADL. Required multiple therapeutic rest breaks.     Vision Patient Visual Report: No change from baseline Vision Assessment?: No apparent visual deficits     Perception     Praxis      Pertinent Vitals/Pain Pain Assessment: Faces Faces Pain Scale: Hurts little more Pain Descriptors / Indicators: Discomfort Pain Intervention(s): Monitored during  session;Repositioned     Hand Dominance Right    Extremity/Trunk Assessment Upper Extremity Assessment Upper Extremity Assessment: Overall WFL for tasks assessed   Lower Extremity Assessment Lower Extremity Assessment: Defer to PT evaluation       Communication Communication Communication: No difficulties   Cognition Arousal/Alertness: Awake/alert Behavior During Therapy: WFL for tasks assessed/performed Overall Cognitive Status: Within Functional Limits for tasks assessed                     General Comments       Exercises       Shoulder Instructions      Home Living Family/patient expects to be discharged to:: Private residence Living Arrangements: Alone Available Help at Discharge: Friend(s);Available PRN/intermittently Type of Home: House Home Access: Stairs to enter [employer] of Steps: 6 Entrance Stairs-Rails: Right;Left Home Layout: Two level;Able to live on main level with bedroom/bathroom   Alternate Level Stairs-Rails: Right;Left Bathroom Shower/Tub: Occupational psychologist: Standard Bathroom Accessibility: Yes   Home Equipment: Environmental consultant - 4 wheels;Wheelchair - manual;Shower seat - built in;Grab bars - tub/shower;Hand held shower head;Adaptive equipment Adaptive Equipment: Reacher        Prior Functioning/Environment Level of Independence: Independent with assistive device(s)        Comments: Independent with w/c and compensatory strategies for ADL. Has someone come in to do household management/cleaning.        OT Problem List: Decreased activity tolerance;Impaired balance (sitting and/or standing);Decreased safety awareness;Decreased knowledge of use of DME or AE;Decreased knowledge of precautions;Pain      OT Treatment/Interventions: Self-care/ADL training;Therapeutic exercise;Energy conservation;DME and/or AE instruction;Therapeutic activities;Patient/family education;Balance training    OT Goals(Current goals can be found in the care plan section) Acute Rehab  OT Goals Patient Stated Goal: to go home soon and feel better OT Goal Formulation: With patient Time For Goal Achievement: 01/08/17 Potential to Achieve Goals: Good ADL Goals Pt Will Perform Lower Body Bathing: with modified independence;sitting/lateral leans Pt Will Transfer to Toilet: with modified independence;squat pivot transfer;bedside commode Pt Will Perform Toileting - Clothing Manipulation and hygiene: with modified independence;sitting/lateral leans Additional ADL Goal #1: Pt will identify 3 energy conservation strategies to incorporate into ADL routine in order to improve activity tolerance for ADL.  OT Frequency: Min 1X/week   Barriers to D/C:            Co-evaluation              End of Session Equipment Utilized During Treatment:  (wheelchair) Nurse Communication: Mobility status  Activity Tolerance: Patient tolerated treatment well Patient left: with call bell/phone within reach (sitting at EOB)  OT Visit Diagnosis: Other abnormalities of gait and mobility (R26.89)                ADL either performed or assessed with clinical judgement  Time: 1142-1200 OT Time Calculation (min): 18 min Charges:  OT General Charges $OT Visit: 1 Procedure OT Evaluation $OT Eval High Complexity: 1 Procedure G-Codes:     Norman Herrlich, MS OTR/L  Pager: [place] A Jessey Huyett 12/25/2016, 2:04 PM

## 2016-12-25 NOTE — Progress Notes (Signed)
  Echocardiogram 2D Echocardiogram has been performed.  [name] 12/25/2016, 4:07 PM

## 2016-12-25 NOTE — Consult Note (Signed)
Reason for Consult: Acute kidney Injury on chronic kidney disease stage III Referring Physician: Dessa Phi M.D. [facility])  HPI:  71 year old Caucasian woman with past medical history significant for insulin-dependent type 2 diabetes mellitus, hypertension, peripheral vascular disease, coronary artery disease status post CABG, aortic stenosis, history of congestive heart failure with EF of 30%/diastolic dysfunction and chronic kidney disease stage III with baseline creatinine of 1.4-1.6.  She presented to the emergency room yesterday with concerns of decreasing urine output over the weekend and recent elevation of creatinine to 2.2 from recent labs last week. Prior to this, she had had a "flulike reaction" after influenza vaccination and reports low-grade fevers with some nausea and decreased oral intake although was able to drink fluids (she continue to take torsemide throughout the entire duration). More recently, she developed a wound on her left knee for which she was started on doxycycline by her primary care provider. She reports some recent shortness of breath and exertional dyspnea as well as weakness but denies any obvious sputum production. She denies any dysuria, urgency, frequency, flank pain, fever or chills. She denies any recent skin rash, epistaxis or hemoptysis.   She reports that she last saw Dr. Justin Mend for ongoing care of chronic kidney disease in December, 2014 after which she was lost to follow-up. She has baseline chronic kidney disease that appears to be from a combination of diabetes/hypertension and ischemic nephropathy with significant AKI 4 years ago from vancomycin exposure.  Past Medical History:  Diagnosis Date  . Anemia   . Anginal pain ([place])    "history; not currently" (08/28/2012)  . Aortic stenosis    0.8cm2 on echo in 10/2011  . Arthritis    "hands, hips, all over" (08/28/2012  . B12 deficiency anemia    B 12 injection "monthly since 05/2012" (08/28/2012)  .  Bilateral carpal tunnel syndrome 1970s  . Blood transfusion    no reaction from transfusion; "when I had heart surgery" (08/28/2012)  . Breast cancer ([place])    left  . CAD (coronary artery disease)    s/p CABG x3 in 1993; last cath in 2010  . Cat bite 2009   with MRSA; required I & D  . Cellulitis   . Cellulitis June 2013, Nov. 2013 and Feb. 14, 2014   Left leg  . CHF (congestive heart failure) ([place]) Aug. 2012   Echo 10/2011: Mild LVH, EF 30%, apex akinetic, septal HK, grade 2 diastolic dysfunction, or functionally bicuspid aortic valve, mild aortic stenosis by gradient, severe by calculated AVA-suspect A. Aortic stenosis probably moderate, trivial MR, mild LAE, mild RAE.   [name] Claudication ([place])   . Exertional dyspnea    "because of the heart failure" (08/28/2012)  . GERD (gastroesophageal reflux disease)   . Heart murmur   . History of bronchitis    "I've had it twice in my life" (08/28/2012)  . History of recurrent UTIs   . HTN (hypertension)   . Hyperlipidemia    on Lipitor  . Hypothyroidism   . Iron deficiency anemia 06/13/2012   "iron infusion" (08/28/2012  . Myocardial infarction 1986   "silent"  . Osteoarthritis of both feet   . Osteomyelitis ([place])    left foot  . PVD (peripheral vascular disease) ([place])    Toes amputated from left foot and has had prior bypass on left leg. Followed by Dr. Donnetta Hutching  . Scleroderma ([place])   . Seizure ([place])    ? seizure like event in 2010; workup included stress  testing which led to repeat cath.   . Stomach ulcer 1981?   "on Tagament for ~ 1 yr" in 23s  . Type 1 diabetes ([place]) 10/1949    Past Surgical History:  Procedure Laterality Date  . AMPUTATION Right 05/27/2013   Procedure: AMPUTATION DIGIT FIFTH TOE;  Surgeon: Rosetta Posner, MD;  Location[facility] OR;  Service: Vascular;  Laterality: Right;  . AMPUTATION Left 03/21/2016   Procedure: Left Transmetatarsal Amputation;  Surgeon: Newt Minion, MD;  Location: [place];  Service: Orthopedics;   Laterality: Left;  . APPENDECTOMY  1991  . BREAST BIOPSY    . BREAST BIOPSY WITH SENTINEL LYMPH NODE BIOPSY AND NEEDLE LOCALIZATION  1984  . CARDIAC CATHETERIZATION  2010   LIMA to LAD patent yet with occluded distal LAD after graft insertion & retrograde is occluded. 3-4 septal perforators arise &are patent. SVG to a large marginal patent; SVG presumably to the distal dominant LCX is occluded, moderately high grade stenosis of the AV circumflex, but with distal stenoses involving a severely diseased marginal branch not amenable  to PCI  nor is inferior branch   . CARDIAC CATHETERIZATION  10/2011   "unsuccessful attempt of stenting" (08/28/2012)  . CARDIAC CATHETERIZATION  1992; 09/2011  . CARPAL TUNNEL RELEASE  ~ 1970's   bilaterally  . CATARACT EXTRACTION W/ INTRAOCULAR LENS  IMPLANT, BILATERAL  1990's  . CORONARY ARTERY BYPASS GRAFT  1992   LIMA to LAD, SVG to distal LCX & SVG to Marginal  . FEMORAL BYPASS     left leg "related to transmetatarsal amputation" (08/28/2012)  . INCISION AND DRAINAGE OF WOUND  ~ 1967   "infection in left foot" (08/28/2012)  . INCISION AND DRAINAGE OF WOUND  2009   "3 ORs on my right hand after cat bite" (08/28/2012)  . LEFT AND RIGHT HEART CATHETERIZATION WITH CORONARY ANGIOGRAM N/A 09/17/2011   Procedure: LEFT AND RIGHT HEART CATHETERIZATION WITH CORONARY ANGIOGRAM;  Surgeon: Hillary Bow, MD;  Location: [facility] CATH LAB;  Service: Cardiovascular;  Laterality: N/A;  . LOWER EXTREMITY ANGIOGRAM N/A 05/25/2013   Procedure: LOWER EXTREMITY ANGIOGRAM POSS INTERVENTION;  Surgeon: Conrad , MD;  Location: [facility] CATH LAB;  Service: Cardiovascular;  Laterality: N/A;  . MASTECTOMY  11/1982   Left Breast  . PERCUTANEOUS CORONARY STENT INTERVENTION (PCI-S) N/A 11/15/2011   Procedure: PERCUTANEOUS CORONARY STENT INTERVENTION (PCI-S);  Surgeon: Hillary Bow, MD;  Location: [facility] CATH LAB;  Service: Cardiovascular;  Laterality: N/A;  . TONSILLECTOMY  1952   "?adenoids"    . TRANSMETATARSAL AMPUTATION  04/2009 - 10/2009   "4 ORs; left foot"  . TRIGGER FINGER RELEASE  1980's   right thumb  . TUBAL LIGATION  1984  . VITRECTOMY  1990's-2004   "both eyes; I've had total of 4"    Family History  Problem Relation Age of Onset  . Diabetes Mother   . Stroke Mother   . Hypertension Mother   . Stroke Father   . Heart attack Neg Hx     Social History:  reports that she has never smoked. She has never used smokeless tobacco. She reports that she does not drink alcohol or use drugs.  Allergies:  Allergies  Allergen Reactions  . Cephalexin Swelling and Rash  . Ciprofloxacin Swelling and Rash  . Zyvox [Linezolid] Other (See Comments)    Thrombocytopenia developed to 50k after several weeks of therapy  . Adhesive [Tape] Other (See Comments)    "old Johnson[employer]Johnson adhesive tape;  takes my skin off; can use paper tape"  . Bactrim [Sulfamethoxazole-Trimethoprim] Other (See Comments)    Hyperkalemia (elevated potassium) and Increased creatinine  . Daptomycin Other (See Comments)    Had elevated CPK on therapy, not clear that this was due to cubicin  . Vancomycin Other (See Comments)    ? If this played role in her Acute kidney injury    Medications:  Scheduled: . aspirin EC  325 mg Oral Daily  . atorvastatin  40 mg Oral QHS  . dextrose      . enoxaparin (LOVENOX) injection  30 mg Subcutaneous Q24H  . guaiFENesin  600 mg Oral BID  . insulin aspart  0-9 Units Subcutaneous Q4H  . insulin detemir  12 Units Subcutaneous BID  . isosorbide mononitrate  30 mg Oral Daily  . levothyroxine  100 mcg Oral QAC breakfast  . senna-docusate  1 tablet Oral Daily  . sodium chloride flush  3 mL Intravenous Q12H    BMP Latest Ref Rng & Units 12/25/2016 12/25/2016 01/04/2017  Glucose 65 - 99 mg/dL 134(H) 136(H) 369(H)  BUN 6 - 20 mg/dL 76(H) - 80(H)  Creatinine 0.44 - 1.00 mg/dL 1.99(H) - 2.20(H)  BUN/Creat Ratio 12 - 28 - - -  Sodium 135 - 145 mmol/L 133(L) - 132(L)   Potassium 3.5 - 5.1 mmol/L 3.9 - 4.4  Chloride 101 - 111 mmol/L 99(L) - 97(L)  CO2 22 - 32 mmol/L 22 - 23  Calcium 8.9 - 10.3 mg/dL 8.9 - 9.1   CBC Latest Ref Rng & Units 12/25/2016 01/11/2017 12/10/2016  WBC 4.0 - 10.5 K/uL 6.5 5.7 5.5  Hemoglobin 12.0 - 15.0 g/dL 14.2 14.6 -  Hematocrit 36.0 - 46.0 % 43.8 44.5 41.2  Platelets 150 - 400 K/uL 147(L) 152 198     Dg Chest 2 View  Result Date: 01/08/2017 CLINICAL DATA:  Patient with history of pulmonary edema. EXAM: CHEST  2 VIEW COMPARISON:  Chest radiograph 07/29/2015. FINDINGS: Stable enlarged cardiac and mediastinal contours status post median sternotomy and CABG procedure. Re- demonstrated fractured sternotomy wires. Unchanged blunting of the right costophrenic angle with minimal heterogeneous opacities right lung base. Left lung is clear. Thoracic spine degenerative changes. IMPRESSION: Findings suggestive of moderate right pleural effusion with underlying pulmonary consolidation. Aortic atherosclerosis. Electronically Signed   By: Lovey Newcomer M.D.   On: 12/15/2016 17:52   US Renal  Result Date: 12/25/2016 CLINICAL DATA:  71 year old diabetic hypertensive female with chronic kidney disease. Initial encounter. EXAM: RENAL / URINARY TRACT ULTRASOUND COMPLETE COMPARISON:  05/29/2013. FINDINGS: Right Kidney: Length: 10.5 cm. No hydronephrosis. Increased echogenicity. Lobular contour. 1.1 cm cyst lower pole region. Left Kidney: Length: 9.8 cm. No hydronephrosis. Increased echogenicity. Lobular contour. 0.7 cm cyst upper pole region. Bladder: Appears normal for degree of bladder distention. Bilateral pleural effusions greater on right.  Ascites. IMPRESSION: No hydronephrosis. Increased renal echogenicity bilaterally consistent with changes of medical renal disease. Small renal cysts. Bilateral pleural effusions greater on right. Ascites. Electronically Signed   By: Genia Del M.D.   On: 12/25/2016 06:45    Review of Systems  Constitutional:  Positive for malaise/fatigue. Negative for chills and fever.       Reports fevers and chills about one week ago  HENT: Negative.   Eyes: Negative.   Respiratory: Positive for shortness of breath. Negative for cough.   Cardiovascular: Positive for leg swelling. Negative for chest pain, palpitations and orthopnea.  Gastrointestinal: Positive for nausea. Negative for abdominal pain, diarrhea  and vomiting.  Genitourinary: Negative.   Skin: Negative.   Neurological: Positive for weakness. Negative for dizziness, sensory change and headaches.   Blood pressure 131/71, pulse 63, temperature 97.2 F (36.2 C), temperature source Oral, resp. rate 16, SpO2 100 %. Physical Exam  Nursing note and vitals reviewed. Constitutional: She is oriented to person, place, and time. She appears well-developed and well-nourished.  HENT:  Head: Normocephalic and atraumatic.  Mouth/Throat: Oropharynx is clear and moist.  Eyes: EOM are normal. Pupils are equal, round, and reactive to light. No scleral icterus.  Neck: Normal range of motion. Neck supple. JVD present.  Cardiovascular: Normal rate, regular rhythm and normal heart sounds.   No murmur heard. Respiratory: Effort normal. She has wheezes. She has no rales.  GI: Soft. Bowel sounds are normal. She exhibits no distension. There is no tenderness.  Musculoskeletal:  Both legs in Ace wrap dressing. Bulky dressing over left knee noted.  Neurological: She is alert and oriented to person, place, and time.  Skin: Skin is warm and dry.  Psychiatric: She has a normal mood and affect.    Assessment/Plan: 1. Acute kidney injury on chronic kidney disease stage III: History/timeline of events and available data from urine electrolytes highly suggestive of intravascular volume contraction with significantly depressed urine sodium in spite of ongoing loop diuretic use. I agree with gentle intravenous fluid therapy and witholding diuretics at this time to allow for  equilibration of intravascular volume. I agree with cautious monitoring given her history of congestive heart failure and tenuous volume status. Renal ultrasound reviewed and consistent with chronic kidney disease-does not show any hydronephrosis/evidence of obstruction. The paucity of RBCs on urine microscopy points away from acute GN and her dipstick proteinuria is explained by the presence of diabetes. Screening for glomerulonephritis will not be undertaken and I will rule out plasma cell dyscrasia with SPEP and free light chains. 2. Hyponatremia: Likely secondary to impaired free water excretion with acute kidney injury. Agree with isotonic fluids and holding diuretic at this time. 3. Hypertension: Blood pressures within acceptable limit, continue to monitor closely with intravenous fluids and while holding diuretics. 4. Cellulitis of left leg/knee area: Status post 10 day duration of doxycycline  Bellarae Lizer K. 12/25/2016, 12:10 PM

## 2016-12-25 NOTE — Progress Notes (Signed)
Inpatient Diabetes Program Recommendations  AACE/ADA: New Consensus Statement on Inpatient Glycemic Control (2015)  Target Ranges:  Prepandial:   less than 140 mg/dL      Peak postprandial:   less than 180 mg/dL (1-2 hours)      Critically ill patients:  140 - 180 mg/dL   Lab Results  Component Value Date   GLUCAP 109 (H) 12/25/2016   HGBA1C 10.0 11/17/2013    Review of Glycemic ControlResults for AGATA, LUCENTE (MRN [id number]) as of 12/25/2016 13:26  Ref. Range 12/25/2016 05:20 12/25/2016 06:47 12/25/2016 07:08 12/25/2016 08:27 12/25/2016 12:13  Glucose-Capillary Latest Ref Range: 65 - 99 mg/dL 115 (H) 77 149 (H) 75 109 (H)    Diabetes history: Type 1 diabetes since 1951 Outpatient Diabetes medications: Levemir 10 units bid, Humalog 1-12 units four times a day Current orders for Inpatient glycemic control:  Novolog sensitive q 4 hours, Levemir 12 units bid  Inpatient Diabetes Program Recommendations:    Note that blood sugars less than 100 mg/dL overnight and patient had symptoms of hypoglycemia.  D50 was given.  Patient states that she refused Am dose of Levemir due to concerns regarding low blood sugars.  She has had diabetes for 67 years and thus is very knowledgeable of her disease process and insulin regimen.  She states that when she is in the hospital, her blood sugars are more difficult to control.  She was NPO earlier but is now being allowed to eat.  She states that she does a combination of counting CHO and adding correction-however she admits that she has no exact formula and that she bases doses on experience as well.   Encouraged patient to discuss insulin doses with nursing staff when due.  May need adjustment of Levemir based on CBG's. A1C pending.  Patient see's Dr. Chalmers Cater for diabetes.  Thanks, Adah Perl, RN, BC-ADM Inpatient Diabetes Coordinator Pager [phone] (8a-5p)

## 2016-12-25 NOTE — Telephone Encounter (Signed)
Pt admitted today.

## 2016-12-25 NOTE — Consult Note (Signed)
CARDIOLOGY CONSULT NOTE   Patient ID: [name] MRN: [id number] DOB/AGE: [birth year] 72 y.o.  Admit date: 12/26/2016  Primary Physician   Wendie Agreste, MD Primary Cardiologist   Dr. Harrington Challenger Reason for Consultation   CHF Requesting Physician  Dr. Roel Cluck  HPI: [name] is a 71 y.o. female with a history of CAD s/p CABG, chronic combined CHF, moderate aortic stenosis, DM, HLD, PVD followed by Dr. Donnetta Hutching s/p L foot transmetatarsal amputation, hypothyroidism, syncope, CKD stage III-IV , and Iron/B12 deficiiency followed by oncology presents 12/23/2016 for edema and low urine output.    Last echo Oct 2016 LVEF 25 to 30%, RVEF mod depressed; mod aortic stenosis. Last cath in 09/2011 showed patent LIMA to LAD with occlusion of distal LAD; High grade stenosis of nondominant RCA; Signifcant disease of prox LCx; SVG to L PDA occluded, SVG to OM patent  Patient had a multiple episode of syncope March/April 2017. Event monitor did not show any arrhythmia.  Recently required multiple increased dose of torsemide due to elevated BNP of 1134 on 12/10/16.  Patient has noted decreased urine output even after increased dose of torsemide on Saturday 3/10. Her other complaints is chronic bilateral lower extremity edema with wound drainage. Her edema extending up to her abdomen. Intermittent orthopnea and PND. Patient had a "blackout episode" 2/27 for 15 seconds when she was trying to get in her car to go to PCP apppointment. Hx of palpitations x 2 weeks.  She was not driving. No prodrome. She had similar episode in spring 2017. Cough for the past few days. Recently  finished doxycycline for her cellulitis.  Work up  In ER noted to be for Sodium 132 glucose 369 BUN 80 creatinine 2.2. HGb 14.6. CXR worrisome for moderate right pleural effusion with possible pulmonary consolidation. He was admitted for pneumonia and started on antibiotic. BNP 706. Troponin flat trend 0.06 x 3. TSH elevated to  17.998. D-dimer 2.2. Pending VQ scan. Pending echocardiogram. Pending evaluation by nephrology. Scr improved to 1.99 with holding diuretics.   Renal US showed No hydronephrosis. Increased renal echogenicity bilaterally consistent with changes of medical renal disease. Small renal cysts. Bilateral pleural effusions greater on right. Ascites.  Personally reviewed:  EKG on presentation tachycardia at rate of 116 bpm. Symptoms aflutter vs afib. Repeat EKG this morning showed rate of 103 bpm with rhythm of A. Fib. Review of telemetry shows intermittent A. fib and a flutter at rate of 120s.    Past Medical History:  Diagnosis Date  . Anemia   . Anginal pain ([place])    "history; not currently" (08/28/2012)  . Aortic stenosis    0.8cm2 on echo in 10/2011  . Arthritis    "hands, hips, all over" (08/28/2012  . B12 deficiency anemia    B 12 injection "monthly since 05/2012" (08/28/2012)  . Bilateral carpal tunnel syndrome 1970s  . Blood transfusion    no reaction from transfusion; "when I had heart surgery" (08/28/2012)  . Breast cancer ([place])    left  . CAD (coronary artery disease)    s/p CABG x3 in 1993; last cath in 2010  . Cat bite 2009   with MRSA; required I & D  . Cellulitis   . Cellulitis June 2013, Nov. 2013 and Feb. 14, 2014   Left leg  . CHF (congestive heart failure) ([place]) Aug. 2012   Echo 10/2011: Mild LVH, EF 30%, apex akinetic, septal HK, grade 2 diastolic dysfunction, or functionally bicuspid aortic valve,  mild aortic stenosis by gradient, severe by calculated AVA-suspect A. Aortic stenosis probably moderate, trivial MR, mild LAE, mild RAE.   [name] Claudication ([place])   . Exertional dyspnea    "because of the heart failure" (08/28/2012)  . GERD (gastroesophageal reflux disease)   . Heart murmur   . History of bronchitis    "I've had it twice in my life" (08/28/2012)  . History of recurrent UTIs   . HTN (hypertension)   . Hyperlipidemia    on Lipitor  . Hypothyroidism   .  Iron deficiency anemia 06/13/2012   "iron infusion" (08/28/2012  . Myocardial infarction 1986   "silent"  . Osteoarthritis of both feet   . Osteomyelitis ([place])    left foot  . PVD (peripheral vascular disease) ([place])    Toes amputated from left foot and has had prior bypass on left leg. Followed by Dr. Donnetta Hutching  . Scleroderma ([place])   . Seizure ([place])    ? seizure like event in 2010; workup included stress testing which led to repeat cath.   . Stomach ulcer 1981?   "on Tagament for ~ 1 yr" in 62s  . Type 1 diabetes ([place]) 10/1949     Past Surgical History:  Procedure Laterality Date  . AMPUTATION Right 05/27/2013   Procedure: AMPUTATION DIGIT FIFTH TOE;  Surgeon: Rosetta Posner, MD;  Location: [facility] OR;  Service: Vascular;  Laterality: Right;  . AMPUTATION Left 03/21/2016   Procedure: Left Transmetatarsal Amputation;  Surgeon: Newt Minion, MD;  Location: [facility];  Service: Orthopedics;  Laterality: Left;  . APPENDECTOMY  1991  . BREAST BIOPSY    . BREAST BIOPSY WITH SENTINEL LYMPH NODE BIOPSY AND NEEDLE LOCALIZATION  1984  . CARDIAC CATHETERIZATION  2010   LIMA to LAD patent yet with occluded distal LAD after graft insertion & retrograde is occluded. 3-4 septal perforators arise &are patent. SVG to a large marginal patent; SVG presumably to the distal dominant LCX is occluded, moderately high grade stenosis of the AV circumflex, but with distal stenoses involving a severely diseased marginal branch not amenable  to PCI  nor is inferior branch   . CARDIAC CATHETERIZATION  10/2011   "unsuccessful attempt of stenting" (08/28/2012)  . CARDIAC CATHETERIZATION  1992; 09/2011  . CARPAL TUNNEL RELEASE  ~ 1970's   bilaterally  . CATARACT EXTRACTION W/ INTRAOCULAR LENS  IMPLANT, BILATERAL  1990's  . CORONARY ARTERY BYPASS GRAFT  1992   LIMA to LAD, SVG to distal LCX & SVG to Marginal  . FEMORAL BYPASS     left leg "related to transmetatarsal amputation" (08/28/2012)  . INCISION AND DRAINAGE OF WOUND  ~  1967   "infection in left foot" (08/28/2012)  . INCISION AND DRAINAGE OF WOUND  2009   "3 ORs on my right hand after cat bite" (08/28/2012)  . LEFT AND RIGHT HEART CATHETERIZATION WITH CORONARY ANGIOGRAM N/A 09/17/2011   Procedure: LEFT AND RIGHT HEART CATHETERIZATION WITH CORONARY ANGIOGRAM;  Surgeon: Hillary Bow, MD;  Location: [facility] CATH LAB;  Service: Cardiovascular;  Laterality: N/A;  . LOWER EXTREMITY ANGIOGRAM N/A 05/25/2013   Procedure: LOWER EXTREMITY ANGIOGRAM POSS INTERVENTION;  Surgeon: Conrad Watson, MD;  Location: [facility] CATH LAB;  Service: Cardiovascular;  Laterality: N/A;  . MASTECTOMY  11/1982   Left Breast  . PERCUTANEOUS CORONARY STENT INTERVENTION (PCI-S) N/A 11/15/2011   Procedure: PERCUTANEOUS CORONARY STENT INTERVENTION (PCI-S);  Surgeon: Hillary Bow, MD;  Location: [facility] CATH LAB;  Service: Cardiovascular;  Laterality: N/A;  . TONSILLECTOMY  1952   "?adenoids"   . TRANSMETATARSAL AMPUTATION  04/2009 - 10/2009   "4 ORs; left foot"  . TRIGGER FINGER RELEASE  1980's   right thumb  . TUBAL LIGATION  1984  . VITRECTOMY  1990's-2004   "both eyes; I've had total of 4"    Allergies  Allergen Reactions  . Cephalexin Swelling and Rash  . Ciprofloxacin Swelling and Rash  . Zyvox [Linezolid] Other (See Comments)    Thrombocytopenia developed to 50k after several weeks of therapy  . Adhesive [Tape] Other (See Comments)    "old [employer] adhesive tape; takes my skin off; can use paper tape"  . Bactrim [Sulfamethoxazole-Trimethoprim] Other (See Comments)    Hyperkalemia (elevated potassium) and Increased creatinine  . Daptomycin Other (See Comments)    Had elevated CPK on therapy, not clear that this was due to cubicin  . Vancomycin Other (See Comments)    ? If this played role in her Acute kidney injury    I have reviewed the patient's current medications . aspirin EC  325 mg Oral Daily  . atorvastatin  40 mg Oral QHS  . aztreonam  1 g Intravenous Q8H  . dextrose       . doxycycline (VIBRAMYCIN) IV  100 mg Intravenous Q12H  . enoxaparin (LOVENOX) injection  30 mg Subcutaneous Q24H  . guaiFENesin  600 mg Oral BID  . insulin aspart  0-9 Units Subcutaneous Q4H  . insulin detemir  12 Units Subcutaneous BID  . isosorbide mononitrate  30 mg Oral Daily  . levothyroxine  100 mcg Oral QAC breakfast  . senna-docusate  1 tablet Oral Daily  . sodium chloride flush  3 mL Intravenous Q12H   . sodium chloride 50 mL/hr at 12/22/2016 1721   acetaminophen **OR** acetaminophen, HYDROcodone-acetaminophen, levalbuterol, ondansetron **OR** ondansetron (ZOFRAN) IV  Prior to Admission medications   Medication Sig Start Date End Date Taking? Authorizing Provider  acetaminophen (TYLENOL) 325 MG tablet Take 325-650 mg by mouth every 6 (six) hours as needed (for pain or headaches).   Yes Historical Provider, MD  aspirin EC 325 MG EC tablet Take 325 mg by mouth daily.    Yes Historical Provider, MD  atorvastatin (LIPITOR) 40 MG tablet TAKE 1 TABLET BY MOUTH AT BEDTIME 10/22/16  Yes Fay Records, MD  Cyanocobalamin 1000 MCG/ML KIT Inject 1,000 mcg into the skin every 8 (eight) weeks.    Yes Historical Provider, MD  docusate sodium (COLACE) 50 MG capsule Take 50 mg by mouth daily.   Yes Historical Provider, MD  hydrOXYzine (ATARAX/VISTARIL) 10 MG tablet Take 1 tablet (10 mg total) by mouth 3 (three) times daily as needed for itching or anxiety. 03/23/16  Yes Ivan Anchors Love, PA-C  insulin detemir (LEVEMIR) 100 UNIT/ML injection Inject 12 Units into the skin 2 (two) times daily. MORNING AND BEDTIME   Yes Historical Provider, MD  insulin lispro (HUMALOG) 100 UNIT/ML injection Inject 1-12 Units into the skin 4 (four) times daily as needed for high blood sugar (sliding scale).    Yes Historical Provider, MD  isosorbide mononitrate (IMDUR) 30 MG 24 hr tablet Take 1 tablet (30 mg total) by mouth daily. 06/06/16  Yes Fay Records, MD  levothyroxine (SYNTHROID, LEVOTHROID) 100 MCG tablet Take 100  mcg by mouth daily before breakfast.  05/19/15  Yes Historical Provider, MD  torsemide (DEMADEX) 20 MG tablet Take 2 tablets (40 mg total) by mouth every morning. 11/02/16  Yes Nevin Bloodgood  Alcus Dad, MD     Social History   Social History  . Marital status: Divorced    Spouse name: N/A  . Number of children: N/A  . Years of education: N/A   Occupational History  . Web designer    Social History Main Topics  . Smoking status: Never Smoker  . Smokeless tobacco: Never Used  . Alcohol use No  . Drug use: No  . Sexual activity: Not Currently    Birth control/ protection: Post-menopausal   Other Topics Concern  . Not on file   Social History Narrative  . No narrative on file    Family Status  Relation Status  . Mother Deceased  . Father Deceased  . Brother Alive  . Maternal Grandmother Deceased  . Maternal Grandfather Deceased  . Paternal Grandmother Deceased  . Paternal Grandfather Deceased  . Neg Hx    Family History  Problem Relation Age of Onset  . Diabetes Mother   . Stroke Mother   . Hypertension Mother   . Stroke Father   . Heart attack Neg Hx      ROS:  Full 14 point review of systems complete and found to be negative unless listed above.  Physical Exam: Blood pressure 131/71, pulse 63, temperature 97.2 F (36.2 C), temperature source Oral, resp. rate 16, SpO2 100 %.  General: Well developed, well nourished, female in no acute distress Head: Eyes PERRLA, No xanthomas. Normocephalic and atraumatic, oropharynx without edema or exudate.  Lungs: Resp regular and unlabored, course breath sound with faint rates Heart: Ir Ir tachycardia no s3, s4, or systolic murmur Neck: No carotid bruits. No lymphadenopathy.  + JVD. Abdomen: Bowel sounds present, distended Msk:  No spine or cva tenderness. No weakness, no joint deformities or effusions. Extremities: L foot transmetatarsal amputation. 2+ swelling up to hips and extending to her abdomen. ACe wrap on bilateral  legs.  Neuro: Alert and oriented X 3. No focal deficits noted. Psych:  Good affect, responds appropriately Skin: No rashes or lesions noted.  Labs:   Lab Results  Component Value Date   WBC 6.5 12/25/2016   HGB 14.2 12/25/2016   HCT 43.8 12/25/2016   MCV 92.0 12/25/2016   PLT 147 (L) 12/25/2016   No results for input(s): INR in the last 72 hours.  Recent Labs Lab 12/25/16 0705  NA 133*  K 3.9  CL 99*  CO2 22  BUN 76*  CREATININE 1.99*  CALCIUM 8.9  PROT 6.5  BILITOT 1.1  ALKPHOS 104  ALT 13*  AST 23  GLUCOSE 134*  136*  ALBUMIN 2.9*   Magnesium  Date Value Ref Range Status  12/25/2016 2.2 1.7 - 2.4 mg/dL Final    Recent Labs  01/09/2017 2016 12/25/16 0156 12/25/16 0705  TROPONINI 0.06* 0.06* 0.06*   No results for input(s): TROPIPOC in the last 72 hours. Pro B Natriuretic peptide (BNP)  Date/Time Value Ref Range Status  04/06/2015 10:38 AM 1,041.0 (H) 0.0 - 100.0 pg/mL Final    Comment:    Results faxed to site/floor on 04/06/2015 11:34 AM by Delorise Jackson.  09/06/2014 04:35 PM 972.0 (H) 0.0 - 100.0 pg/mL Final   NT-Pro BNP  Date/Time Value Ref Range Status  12/10/2016 02:31 PM 21,500 (H) 0 - 301 pg/mL Final    Comment:    The following cut-points have been suggested for the use of proBNP for the diagnostic evaluation of heart failure (HF) in patients with acute dyspnea: Modality  Age           Optimal Cut                            (years)            Point ------------------------------------------------------ Diagnosis (rule in HF)        <50            450 pg/mL                           50 - 75            900 pg/mL                               >75           1800 pg/mL Exclusion (rule out HF)  Age independent     300 pg/mL    Lab Results  Component Value Date   CHOL 125 11/15/2016   HDL 47 11/15/2016   LDLCALC 58 11/15/2016   TRIG 99 11/15/2016   Lab Results  Component Value Date   DDIMER 2.20 (H) 01/10/2017   No  results found for: LIPASE, AMYLASE TSH  Date/Time Value Ref Range Status  12/25/2016 01:57 AM 17.998 (H) 0.350 - 4.500 uIU/mL Final    Comment:    Performed by a 3rd Generation assay with a functional sensitivity of <=0.01 uIU/mL.  04/06/2015 10:38 AM 0.78 0.35 - 4.50 uIU/mL Final   Vitamin B12  Date/Time Value Ref Range Status  07/05/2016 11:19 AM 500 211 - 946 pg/mL Final   Folate  Date/Time Value Ref Range Status  04/04/2012 06:10 AM >20.0 ng/mL Final    Comment:    (NOTE) Reference Ranges        Deficient:       0.4 - 3.3 ng/mL        Indeterminate:   3.4 - 5.4 ng/mL        Normal:              > 5.4 ng/mL   Ferritin  Date/Time Value Ref Range Status  10/12/2016 10:26 AM 208 20 - 288 ng/mL Final  08/30/2014 11:55 AM 60 9 - 269 ng/ml Final   TIBC  Date/Time Value Ref Range Status  08/28/2013 11:22 AM 243 236 - 444 ug/dL Final   Iron  Date/Time Value Ref Range Status  08/06/2016 10:33 AM 21 (L) 45 - 160 ug/dL Final  08/28/2013 11:22 AM 61 41 - 142 ug/dL Final   Retic %  Date/Time Value Ref Range Status  08/23/2016 02:34 PM 1.09 0.70 - 2.10 % Final    Echo: 07/2015 Study Conclusions  - Left ventricle: The cavity size was normal. Wall thickness was   normal. Systolic function was severely reduced. The estimated   ejection fraction was in the range of 25% to 30%. Severe diffuse   hypokinesis with distinct regional wall motion abnormalities.   Dyskinesis and scarring of the inferior and inferoseptal   myocardium. Probable akinesis of the apical myocardium. Features   are consistent with a pseudonormal left ventricular filling   pattern, with concomitant abnormal relaxation and increased   filling pressure (grade 2 diastolic dysfunction). - Aortic valve: Severe diffuse calcification involving the   noncoronary cusp. Noncoronary cusp mobility was severely   restricted. Transvalvular velocity was increased  less than   expected, due to low cardiac output. There was  moderate stenosis.   There was trivial regurgitation. Valve area (VTI): 0.74 cm^2.   Valve area (Vmean): 0.66 cm^2. The gradients undestimate the true   sevrity of aortic stenosis, but the aortic stenosis appears   non-severe, based on the relatively preserved mobility of 2 of 3   cusps and the early peaking jet configuration. - Mitral valve: Calcified annulus. - Left atrium: The atrium was mildly dilated. - Right ventricle: Systolic function was moderately reduced.    Cath 09/2011 Procedural Findings: Hemodynamics RA 19/17 (15) RV 61/16 PA 59/26 (40) PCWP 31 LV 190/29 AO 170/65 (102) Peak to peak varied from 6-69mHg  Oxygen saturations: SVC 53% PA 56% AO 99%  Cardiac Output (Fick) 3.88 L/min  Cardiac Index (Fick) 2.1 L/min/m2 Cardiac Output  (Thermo)  2.96 L/Min Cardiac Index 1.59 L/min/M2    Coronary angiography: Coronary dominance: left  Left mainstem: The left main coronary artery demonstrates no significant obstruction.  Left anterior descending (LAD): The left anterior descending artery is subtotally occluded beyond the origin of a small diagonal branch more proximally there is about 50% segmental plaquing proximally. The distal vessel was filled by an internal mammary graft.  The internal mammary graft is widely patent it inserts into the midportion of the left anterior descending artery. The left anterior descending artery fills retrograde which supplies at least 3 septal perforators and a large diagonal system the distal left anterior descending artery after the origin of another small diagonal branch is basically totally occluded and the and the apical vessel appears severely diseased.  Left circumflex (LCx):  Circumflex vessel was a dominant vessel. There is a proximal lesion of approximately 75-80% which is progress from the previous catheterization of 2010 the midportion of the dominant circumflex is widely patent. There is an obvious missing circumflex  branch compatible with a known graft that is patent. There is a marginal branch distally that is a sickly subtotally occluded this is followed by a moderate size posterolateral branch that has no significant obstruction this is followed by a posterior descending branch is subtotally occluded the distal remnants of the previously inserted vein graft into the distal circumflex is evident.  The saphenous vein graft to the distal circumflex is essentially occluded.  There is a widely patent saphenous vein graft that inserts into an obtuse marginal branch and fills appears to be some other branches by retrograde collaterals. It is a relatively large territory of the vessel.  Right coronary artery (RCA): The right coronary artery is a nondominant vessel it supplies 2 right ventricular branches. There is a 90-95% stenosis noted in the midportion of the of the artery is relatively small caliber vessel.  Left ventriculography: Ventriculography was not performed because of the need to conserve contrast.  Final Conclusions:   1. Elevated right heart pressures consistent with diastolic dysfunction. 2. Continued patency of the internal mammary artery to the distal left anterior descending artery with occlusion of the distal LAD as noted on previous studies. 3. High grade stenosis of a nondominant right coronary artery. 4. Progression of disease in the proximal dominant circumflex compared to the previous study of 2010. 5. Occlusion of the saphenous vein graft to the posterior descending branch which is derived from the circumflex vessel with high-grade stenosis of the circumflex portion of the PDA-this is chronic. 6. Continued patency of the saphenous vein graft to a obtuse marginal branch, similar to 2010.   Recommendations:  1. We'll admit the patient overnight for gentle hydration and reexamination of her renal function. 2. I reviewed the films with Dr. Harrington Challenger before making a final determination. She  is not a good candidate for redo surgery, particularly revascularization as little of the accomplished at this point. 3. Aortic stenosis is moderate, at least by cath does not appear to be critical.   Radiology:  Dg Chest 2 View  Result Date: 01/07/2017 CLINICAL DATA:  Patient with history of pulmonary edema. EXAM: CHEST  2 VIEW COMPARISON:  Chest radiograph 07/29/2015. FINDINGS: Stable enlarged cardiac and mediastinal contours status post median sternotomy and CABG procedure. Re- demonstrated fractured sternotomy wires. Unchanged blunting of the right costophrenic angle with minimal heterogeneous opacities right lung base. Left lung is clear. Thoracic spine degenerative changes. IMPRESSION: Findings suggestive of moderate right pleural effusion with underlying pulmonary consolidation. Aortic atherosclerosis. Electronically Signed   By: Lovey Newcomer M.D.   On: 12/31/2016 17:52   US Renal  Result Date: 12/25/2016 CLINICAL DATA:  71 year old diabetic hypertensive female with chronic kidney disease. Initial encounter. EXAM: RENAL / URINARY TRACT ULTRASOUND COMPLETE COMPARISON:  05/29/2013. FINDINGS: Right Kidney: Length: 10.5 cm. No hydronephrosis. Increased echogenicity. Lobular contour. 1.1 cm cyst lower pole region. Left Kidney: Length: 9.8 cm. No hydronephrosis. Increased echogenicity. Lobular contour. 0.7 cm cyst upper pole region. Bladder: Appears normal for degree of bladder distention. Bilateral pleural effusions greater on right.  Ascites. IMPRESSION: No hydronephrosis. Increased renal echogenicity bilaterally consistent with changes of medical renal disease. Small renal cysts. Bilateral pleural effusions greater on right. Ascites. Electronically Signed   By: Genia Del M.D.   On: 12/25/2016 06:45    ASSESSMENT AND PLAN:     1. Acute on chronic combined CHF - BNP improved to 706 this admission from 1134 on 12/10/16 with increase dose of torsemide as outpatient. However, worsening renal  function and low urine output. Her kidney function has improved with holding diuretics on admission. She has a significant volume overload on exam. She will require additional diuresis.  CHF team to follow tomorrow.  - Pending echo this admission. Start coreg 6.58m BID.   2. New onset afib with RVR - History of intermittent palpitation for 2 weeks with rate up to 130s at home. In setting of severely elevated TSH, pneumonia and cellulitis. - Her CHADSVASC score is 6. Start her on IV heparin and po amiodarone 4069mBID (due to shortage of IV amiodarone).   3. Syncope - Recent episode occurred for 15 second 12/11/16. Prior history with no arrhythmia on event monitor. - Likely due to arrhythmia. Pending echo.   4. CAD s/p CABG - Last cath in 2012 showed patent LIMA to LAD with occlusion of distal LAD; High grade stenosis of nondominant RCA; Signifcant disease of prox LCx; SVG to L PDA occluded, SVG to OM patent - No angina. Continue ASA, statin and Imdur.   5. Elevated D-dimer - Pending VQ scan  6. Acute on CKD, stage III-IV - As above.   7. Hypothyroidism with elevated TSH - Dose adjustment per primary  8. PVD s/p L foot transmetatarsal amputation with cellulitis - Follow by Dr. EaDonnetta HutchingOn abx. ACE warp.  9. HTN - Stable   10 HLD  - 09/17/2016: VLDL 18 11/15/2016: Cholesterol, Total 125; HDL 47; LDL Calculated 58; Triglycerides 99  - Continue statin  11. DM - per Primary team   12. CAP - on ABX  13. Aortic stenosis - pending echo this admission.  SignedLeanor Kail, PA 12/25/2016, 11:25 AM Pager [phone]  Co-Sign MD  Agree with note by Lisabeth Pick  We are asked to see Mrs. Fuerst because of volume overload and chronic systolic heart failure. She is a patient of Dr. Dorris Carnes. She has a history of remote coronary artery bypass grafting and ischemic cardiomyopathy with an EF of 30% and at least moderate if not greater aortic stenosis. She also has  peripheral vascular disease status post left transmetatarsal amputation status post remote femoropopliteal bypass grafting. She has chronic renal insufficiency, hypertension, hyperlipidemia and diabetes. She was admitted with decreasing urine output and increasing serum creatinine with anasarca. Her BNP is elevated although she has got significantly more short of breath. On exam, her legs are wrapped with Kerlix. Her heart sounds are irregularly irregular. She does have increased venous pressure and anasarca. Her EKG shows new A. Fib/flutter. She has been seen by the nephrology service to recommend replacement of IV fluid. Her serum creatinine has slowly declined from 2.2 down to 1.99. Her BNP is 706. We will obtain a 2-D echocardiogram (it may be that her aortic stenosis has progressed since it was last checked a urine half ago), by mouth amiodarone and IV heparin for now. She will need oral  Anticoagulation ultimately. We will get the heart failure service to consult with regard to potential need for IV inotropes.   [name], M.D., [place], [facility], Laverta Baltimore Macy [address]. [place], [place]  [address]  [phone] 12/25/2016 2:22 PM

## 2016-12-25 NOTE — Progress Notes (Signed)
Patient refusing vibamycin, Patient educated on the importance of the drug and verbalized understanding

## 2016-12-25 NOTE — Progress Notes (Signed)
CBG 77, pt symptomatic, MD paged. Instructions given to use different glucometer.  D50% 50ml given, CBG rechecked 146. Pt stated she is feeling better.

## 2016-12-25 NOTE — Progress Notes (Signed)
PROGRESS NOTE    [name]  KGY:[id number] DOB: 02-10-1946 DOA: 01/12/2017 PCP: Wendie Agreste, MD     Brief Narrative:  [name] is a 71 yo female with medical history significant of CAD, aortic stenosis, diabetes, HLD, PVD, iron deficiency anemia requiring iron infusions, Systolic CHF, history of breast cancer on the left, COPD who presents due to not urinating as much since Saturday. She called her cardiologist told her to increase her torsemide, but she had still had low urine output. Labs were obtained which showed elevated creatinine from 1.6-2.2. She states that she also recently recovered from a URI type symptoms after getting a flu shot. She has had chronic skin infection of bilateral knees, is recently treated with doxycycline.  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 -Coreg started   New onset A Fib RVR -CHADSVASc 6 -IV heparin -Amiodarone PO   CAD s/p CABG  -Stable -Continue aspirin, statin, imdur   Dyspnea -VQ scan ordered due to elevated d-dimer  Essential hypertension -Stable  Diabetes type 1 -We will decrease Levemir to 8 units twice daily -Decrease SSI   Recent treatment of bilateral knee wound  -Recently treated with doxycycline -Not  currently infected  -Wound care per [facility]   PVD -Follows with Dr. Donnetta Hutching   ?CAP -Initially concerned for CAP on admission. However, patient denies any fevers, productive cough. Chest x-ray showed chronic right pleural effusion with possible underlying pulmonary consolidation. As patient has been asymptomatic, will stop antibiotics and monitor   DVT prophylaxis: IV heparin  Code Status: Full Family Communication: no family at bedside Disposition Plan: pending further improvement   Consultants:   Cardiology  Nephrology  Procedures:   None  Antimicrobials:  Anti-infectives    Start     Dose/Rate Route Frequency Ordered Stop   12/19/2016 2200  aztreonam (AZACTAM) 1 g in dextrose 5 % 50 mL IVPB  Status:  Discontinued     1 g 100 mL/hr over 30 Minutes Intravenous Every 8 hours 01/07/2017 2125 12/25/16 1141   01/11/2017 2200  doxycycline (VIBRAMYCIN) 100 mg in dextrose 5 % 250 mL IVPB  Status:  Discontinued     100 mg 125 mL/hr over 120 Minutes Intravenous Every 12 hours 12/23/2016 2125 12/25/16 1141         Subjective: Patient denies any fevers, chills, chest pain or productive cough. She has some shortness of breath and has been dealing with bilateral wounds on her knees. This has been treated with oral doxycycline as outpatient. She states the wounds are improving in appearance.  Objective: Vitals:   12/30/2016 2203 12/25/16 0440 12/25/16 0844 12/25/16 1215  BP: 133/89 113/61 131/71 116/76  Pulse: (!) 119 80 63 (!) 107  Resp: 18 18 16 16   Temp: 97.4 F (36.3 C) 97.4 F (36.3 C) 97.2 F (  36.2 C)   TempSrc: Oral  Oral   SpO2: 97% 98% 100% 100%    Intake/Output Summary (Last 24 hours) at 12/25/16 1601 Last data filed at 12/25/16 0234  Gross per 24 hour  Intake              790 ml  Output                0 ml  Net              790 ml   There were no vitals filed for this visit.  Examination:  General exam: Appears calm and comfortable  Respiratory system: Clear to  auscultation. Diminished right base. Respiratory effort normal. Cardiovascular system: S1 & S2 heard, Irreg irreg. +1-2 peripheral edema  Gastrointestinal system: Abdomen is nondistended, soft and nontender. No organomegaly or masses felt. Normal bowel sounds heard. Central nervous system: Alert and oriented. No focal neurological deficits. Extremities: Symmetric 5 x 5 power. Skin: Bilateral shins wrapped in ACE bandage, bilateral knees in clean and dry dressing  Psychiatry: Judgement and insight appear normal. Mood & affect appropriate.   Data Reviewed: I have personally reviewed following labs and imaging studies  CBC:  Recent Labs Lab 12/28/2016 1505 12/25/16 0705  WBC 5.7 6.5  HGB 14.6 14.2  HCT 44.5 43.8  MCV 92.5 92.0  PLT 152 443*   Basic Metabolic Panel:  Recent Labs Lab 01/09/2017 1505 12/30/2016 2016 12/25/16 0705  NA 132*  --  133*  K 4.4  --  3.9  CL 97*  --  99*  CO2 23  --  22  GLUCOSE 369*  --  134*  136*  BUN 80*  --  76*  CREATININE 2.20*  --  1.99*  CALCIUM 9.1  --  8.9  MG  --  2.2 2.2  PHOS  --   --  4.3   GFR: CrCl cannot be calculated (Unknown ideal weight.). Liver Function Tests:  Recent Labs Lab 01/02/2017 2016 12/25/16 0705  AST 24 23  ALT 15 13*  ALKPHOS 115 104  BILITOT 1.1 1.1  PROT 7.3 6.5  ALBUMIN 3.1* 2.9*   No results for input(s): LIPASE, AMYLASE in the last 168 hours. No results for input(s): AMMONIA in the last 168 hours. Coagulation Profile: No results for input(s): INR, PROTIME in the last 168 hours. Cardiac Enzymes:  Recent Labs Lab 12/17/2016 2016 12/25/16 0156 12/25/16 0705  TROPONINI 0.06* 0.06* 0.06*   BNP (last 3 results)  Recent Labs  12/10/16 1431  PROBNP 21,500*   HbA1C: No results for input(s): HGBA1C in the last 72 hours. CBG:  Recent Labs Lab 12/25/16 0520 12/25/16 0647 12/25/16 0708 12/25/16 0827 12/25/16 1213  GLUCAP 115* 77 149* 75 109*   Lipid Profile: No results for input(s): CHOL,  HDL, LDLCALC, TRIG, CHOLHDL, LDLDIRECT in the last 72 hours. Thyroid Function Tests:  Recent Labs  12/25/16 0157  TSH 17.998*   Anemia Panel: No results for input(s): VITAMINB12, FOLATE, FERRITIN, TIBC, IRON, RETICCTPCT in the last 72 hours. Sepsis Labs: No results for input(s): PROCALCITON, LATICACIDVEN in the last 168 hours.  Recent Results (from the past 240 hour(s))  Urine culture     Status: Abnormal (Preliminary result)   Collection Time: 12/18/2016  6:57 PM  Result Value Ref Range Status   Specimen Description URINE, CLEAN CATCH  Final   Special Requests NONE  Final   Culture >=100,000 COLONIES/mL GRAM NEGATIVE RODS (A)  Final   Report Status  PENDING  Incomplete  MRSA PCR Screening     Status: None   Collection Time: 12/19/2016 11:48 PM  Result Value Ref Range Status   MRSA by PCR NEGATIVE NEGATIVE Final    Comment:        The GeneXpert MRSA Assay (FDA approved for NASAL specimens only), is one component of a comprehensive MRSA colonization surveillance program. It is not intended to diagnose MRSA infection nor to guide or monitor treatment for MRSA infections.        Radiology Studies: Dg Chest 2 View  Result Date: 01/11/2017 CLINICAL DATA:  Patient with history of pulmonary edema. EXAM: CHEST  2 VIEW COMPARISON:  Chest radiograph 07/29/2015. FINDINGS: Stable enlarged cardiac and mediastinal contours status post median sternotomy and CABG procedure. Re- demonstrated fractured sternotomy wires. Unchanged blunting of the right costophrenic angle with minimal heterogeneous opacities right lung base. Left lung is clear. Thoracic spine degenerative changes. IMPRESSION: Findings suggestive of moderate right pleural effusion with underlying pulmonary consolidation. Aortic atherosclerosis. Electronically Signed   By: Lovey Newcomer M.D.   On: 12/30/2016 17:52   US Renal  Result Date: 12/25/2016 CLINICAL DATA:  71 year old diabetic hypertensive female with chronic kidney  disease. Initial encounter. EXAM: RENAL / URINARY TRACT ULTRASOUND COMPLETE COMPARISON:  05/29/2013. FINDINGS: Right Kidney: Length: 10.5 cm. No hydronephrosis. Increased echogenicity. Lobular contour. 1.1 cm cyst lower pole region. Left Kidney: Length: 9.8 cm. No hydronephrosis. Increased echogenicity. Lobular contour. 0.7 cm cyst upper pole region. Bladder: Appears normal for degree of bladder distention. Bilateral pleural effusions greater on right.  Ascites. IMPRESSION: No hydronephrosis. Increased renal echogenicity bilaterally consistent with changes of medical renal disease. Small renal cysts. Bilateral pleural effusions greater on right. Ascites. Electronically Signed   By: Genia Del M.D.   On: 12/25/2016 06:45      Scheduled Meds: . amiodarone  400 mg Oral BID  . aspirin EC  325 mg Oral Daily  . atorvastatin  40 mg Oral QHS  . carvedilol  12.5 mg Oral BID WC  . dextrose      . guaiFENesin  600 mg Oral BID  . insulin aspart  0-9 Units Subcutaneous TID WC  . insulin detemir  8 Units Subcutaneous BID  . isosorbide mononitrate  30 mg Oral Daily  . levothyroxine  100 mcg Oral QAC breakfast  . senna-docusate  1 tablet Oral Daily  . sodium chloride flush  3 mL Intravenous Q12H   Continuous Infusions: . sodium chloride 50 mL/hr at 01/01/2017 1721  . heparin 1,000 Units/hr (12/25/16 1502)     LOS: 1 day    Time spent: 40 minutes   Dessa Phi, DO [facility] [email or web address] Password [facility] 12/25/2016, 4:01 PM

## 2016-12-25 NOTE — Progress Notes (Signed)
PT Cancellation Note  Patient Details Name: ABBIGALE MCELHANEY MRN: [id number] DOB: 03-29-1946   Cancelled Treatment:    Reason Eval/Treat Not Completed: Patient at procedure or test/unavailable.  Will try later as time and pt allow.   Ramond Dial 12/25/2016, 1:07 PM   Mee Hives, PT MS Acute Rehab Dept. Number: [place] and [place]

## 2016-12-25 NOTE — Progress Notes (Signed)
[facility]  Patient Status: Active (receiving services up to time of hospitalization)  [facility] is providing the following services: RN  If patient discharges after hours, please call [phone].   Janae Sauce 12/25/2016, 10:31 AM

## 2016-12-25 NOTE — Progress Notes (Signed)
Discussed with pharmacy. Will switch PO to IV amiodarone.

## 2016-12-25 NOTE — Consult Note (Signed)
[facility] Nurse wound consult note Reason for Consult: knee wounds Patient is closely followed by Dr. Donnetta Hutching for history of PAD/PVD.  Chronic knee wounds from trauma, she reports she has repeated trauma to the knees from bumping them while in the wheelchair.  She has several tiny black areas on the right toes that patient reports Dr. Donnetta Hutching is aware of. She has palpable pulses bilaterally. She has 2+ edema bilaterally. She is very engaged in her care. She reports she has an OT that comes by several times in the week to check her legs and wrap her legs.  Wound type: Trauma left knee/ trauma right knee Arterial non open ulcers right 2nd toe Pressure Injury POA: No Measurement: L knee: 2cm x 2.5cm x 0; 90% scabbed/10% yellow but not open at 10-11 o'clock  R knee: 2.5cm x 2.0cm x 0.1cm; 100% yellow soft fibrinous material  Wound bed: see above Drainage (amount, consistency, odor) minimal from the right knee, no odor Periwound: circumferential erythema bilaterally, patient reports this is chronic for her and unchanged.  Dressing procedure/placement/frequency: Xeroform to the right knee for antibacterial and drying effects, cover with conform.  Change every other day. Silicone foam to the left knee for protection. Change every 3 days.  2" ACE wraps bilaterally from the toe/transmetatarsal amputation site to the knee, with 50% stretch and 50% overlap.Change PRN    Discussed POC with patient and bedside nurse.  Re consult if needed, will not follow at this time. Thanks  Fritz Cauthon [employer], RN,CWOCN, CNS [phone])

## 2016-12-25 NOTE — Progress Notes (Signed)
Pt shaky, CBG 77. MD paged.

## 2016-12-25 NOTE — Progress Notes (Addendum)
ANTICOAGULATION CONSULT NOTE - Initial Consult  Pharmacy Consult for Heparin Indication: atrial fibrillation  Allergies  Allergen Reactions  . Cephalexin Swelling and Rash  . Ciprofloxacin Swelling and Rash  . Zyvox [Linezolid] Other (See Comments)    Thrombocytopenia developed to 50k after several weeks of therapy  . Adhesive [Tape] Other (See Comments)    "old [employer] adhesive tape; takes my skin off; can use paper tape"  . Bactrim [Sulfamethoxazole-Trimethoprim] Other (See Comments)    Hyperkalemia (elevated potassium) and Increased creatinine  . Daptomycin Other (See Comments)    Had elevated CPK on therapy, not clear that this was due to cubicin  . Vancomycin Other (See Comments)    ? If this played role in her Acute kidney injury    Patient Measurements:    Height 63.5 inches Weight 72.1 kg Heparin Dosing Weight: 68 kg  Vital Signs: Temp: 97.2 F (36.2 C) (03/13 0844) Temp Source: Oral (03/13 0844) BP: 116/76 (03/13 1215) Pulse Rate: 107 (03/13 1215)  Labs:  Recent Labs  12/14/2016 1505 12/16/2016 2016 12/25/16 0156 12/25/16 0705  HGB 14.6  --   --  14.2  HCT 44.5  --   --  43.8  PLT 152  --   --  147*  CREATININE 2.20*  --   --  1.99*  TROPONINI  --  0.06* 0.06* 0.06*    Estimated Creatinine Clearance: 25 mL/min (by C-G formula based on SCr of 1.99 mg/dL (H)).   Medical History: Past Medical History:  Diagnosis Date  . Anemia   . Anginal pain ([place])    "history; not currently" (08/28/2012)  . Aortic stenosis    0.8cm2 on echo in 10/2011  . Arthritis    "hands, hips, all over" (08/28/2012  . B12 deficiency anemia    B 12 injection "monthly since 05/2012" (08/28/2012)  . Bilateral carpal tunnel syndrome 1970s  . Blood transfusion    no reaction from transfusion; "when I had heart surgery" (08/28/2012)  . Breast cancer ([place])    left  . CAD (coronary artery disease)    s/p CABG x3 in 1993; last cath in 2010  . Cat bite 2009   with MRSA;  required I & D  . Cellulitis   . Cellulitis June 2013, Nov. 2013 and Feb. 14, 2014   Left leg  . CHF (congestive heart failure) ([place]) Aug. 2012   Echo 10/2011: Mild LVH, EF 30%, apex akinetic, septal HK, grade 2 diastolic dysfunction, or functionally bicuspid aortic valve, mild aortic stenosis by gradient, severe by calculated AVA-suspect A. Aortic stenosis probably moderate, trivial MR, mild LAE, mild RAE.   [name] Claudication ([place])   . Exertional dyspnea    "because of the heart failure" (08/28/2012)  . GERD (gastroesophageal reflux disease)   . Heart murmur   . History of bronchitis    "I've had it twice in my life" (08/28/2012)  . History of recurrent UTIs   . HTN (hypertension)   . Hyperlipidemia    on Lipitor  . Hypothyroidism   . Iron deficiency anemia 06/13/2012   "iron infusion" (08/28/2012  . Myocardial infarction 1986   "silent"  . Osteoarthritis of both feet   . 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 Followed by Dr. Donnetta Hutching  . Scleroderma ([place])   . Seizure ([place])    ? seizure like event in  2010; workup included stress testing which led to repeat cath.   . Stomach ulcer 1981?   "on Tagament for ~ 1 yr" in 93s  . Type 1 diabetes ([place]) 10/1949    Medications:  Prescriptions Prior to Admission  Medication Sig Dispense Refill Last Dose  . acetaminophen (TYLENOL) 325 MG tablet Take 325-650 mg by mouth every 6 (six) hours as needed (for pain or headaches).   Past Week at Unknown time  . aspirin EC 325 MG EC tablet Take 325 mg by mouth daily.    12/18/2016 at 1230  . atorvastatin (LIPITOR) 40 MG tablet TAKE 1 TABLET BY MOUTH AT BEDTIME 30 tablet 3 12/23/2016 at pm  . Cyanocobalamin 1000 MCG/ML KIT Inject 1,000 mcg into the skin every 8 (eight) weeks.    10/23/2016 at am  . docusate sodium (COLACE) 50 MG capsule Take 50 mg by mouth daily.   12/23/2016 at pm  . hydrOXYzine  (ATARAX/VISTARIL) 10 MG tablet Take 1 tablet (10 mg total) by mouth 3 (three) times daily as needed for itching or anxiety. 30 tablet 0 PRN at PRN  . insulin detemir (LEVEMIR) 100 UNIT/ML injection Inject 12 Units into the skin 2 (two) times daily. MORNING AND BEDTIME   12/29/2016 at am  . insulin lispro (HUMALOG) 100 UNIT/ML injection Inject 1-12 Units into the skin 4 (four) times daily as needed for high blood sugar (sliding scale).    01/07/2017 at 1230  . isosorbide mononitrate (IMDUR) 30 MG 24 hr tablet Take 1 tablet (30 mg total) by mouth daily. 90 tablet 3 12/23/2016 at pm  . levothyroxine (SYNTHROID, LEVOTHROID) 100 MCG tablet Take 100 mcg by mouth daily before breakfast.   5 01/10/2017 at am  . torsemide (DEMADEX) 20 MG tablet Take 2 tablets (40 mg total) by mouth every morning. 180 tablet 2 12/23/2016 at am    Assessment: 71 yo F presented to ED with decreased UOP despite increasing diuretic doses.  Home health nurse obtained labs and SCr was 2.2 (baseline 1.6).  Pt also reported 2 weeks for intermittent palpitation and found to be in afib with RVR.  CHADSVASC=6.  Goal of Therapy:  Heparin level 0.3-0.7 units/ml Monitor platelets by anticoagulation protocol: Yes   Plan:  D/C Lovenox (no doses given) Heparin 2500 unit bolus Heparin infusion at 1000 units/hr Heparin level in 8 hours Heparin level and CBC daily Follow-up VQ scan  [employer], Pharm.D., BCPS Clinical Pharmacist Pager [phone] 12/25/2016 1:44 PM

## 2016-12-25 NOTE — Progress Notes (Signed)
EKG completed, Baltazar Najjar, NP notified.

## 2016-12-26 ENCOUNTER — Inpatient Hospital Stay (HOSPITAL_COMMUNITY): Payer: Medicare Other

## 2016-12-26 DIAGNOSIS — N179 Acute kidney failure, unspecified: Secondary | ICD-10-CM

## 2016-12-26 DIAGNOSIS — I5023 Acute on chronic systolic (congestive) heart failure: Secondary | ICD-10-CM

## 2016-12-26 DIAGNOSIS — I484 Atypical atrial flutter: Secondary | ICD-10-CM

## 2016-12-26 DIAGNOSIS — R570 Cardiogenic shock: Secondary | ICD-10-CM

## 2016-12-26 DIAGNOSIS — N3000 Acute cystitis without hematuria: Secondary | ICD-10-CM

## 2016-12-26 LAB — BASIC METABOLIC PANEL
ANION GAP: 11 (ref 5–15)
BUN: 75 mg/dL — ABNORMAL HIGH (ref 6–20)
CO2: 23 mmol/L (ref 22–32)
Calcium: 8.9 mg/dL (ref 8.9–10.3)
Chloride: 98 mmol/L — ABNORMAL LOW (ref 101–111)
Creatinine, Ser: 2.41 mg/dL — ABNORMAL HIGH (ref 0.44–1.00)
GFR calc Af Amer: 22 mL/min — ABNORMAL LOW (ref >60)
GFR calc non Af Amer: 19 mL/min — ABNORMAL LOW (ref >60)
Glucose, Bld: 61 mg/dL — ABNORMAL LOW (ref 65–99)
Potassium: 4.7 mmol/L (ref 3.5–5.1)
Sodium: 132 mmol/L — ABNORMAL LOW (ref 135–145)

## 2016-12-26 LAB — RENAL FUNCTION PANEL
ALBUMIN: 2.5 g/dL — AB (ref 3.5–5.0)
ANION GAP: 12 (ref 5–15)
BUN: 78 mg/dL — AB (ref 6–20)
CHLORIDE: 101 mmol/L (ref 101–111)
CO2: 18 mmol/L — ABNORMAL LOW (ref 22–32)
Calcium: 8.7 mg/dL — ABNORMAL LOW (ref 8.9–10.3)
Creatinine, Ser: 2.56 mg/dL — ABNORMAL HIGH (ref 0.44–1.00)
GFR, EST AFRICAN AMERICAN: 21 mL/min — AB (ref >60)
GFR, EST NON AFRICAN AMERICAN: 18 mL/min — AB (ref >60)
Glucose, Bld: 193 mg/dL — ABNORMAL HIGH (ref 65–99)
PHOSPHORUS: 5.3 mg/dL — AB (ref 2.5–4.6)
POTASSIUM: 5.4 mmol/L — AB (ref 3.5–5.1)
Sodium: 131 mmol/L — ABNORMAL LOW (ref 135–145)

## 2016-12-26 LAB — HEPATIC FUNCTION PANEL
ALK PHOS: 112 U/L (ref 38–126)
ALT: 15 U/L (ref 14–54)
AST: 30 U/L (ref 15–41)
Albumin: 2.9 g/dL — ABNORMAL LOW (ref 3.5–5.0)
BILIRUBIN DIRECT: 0.3 mg/dL (ref 0.1–0.5)
Indirect Bilirubin: 0.4 mg/dL (ref 0.3–0.9)
Total Bilirubin: 0.7 mg/dL (ref 0.3–1.2)
Total Protein: 6.8 g/dL (ref 6.5–8.1)

## 2016-12-26 LAB — COOXEMETRY PANEL
CARBOXYHEMOGLOBIN: 0.9 % (ref 0.5–1.5)
Carboxyhemoglobin: 1.4 % (ref 0.5–1.5)
Carboxyhemoglobin: 1 % (ref 0.5–1.5)
METHEMOGLOBIN: 1 % (ref 0.0–1.5)
METHEMOGLOBIN: 1 % (ref 0.0–1.5)
Methemoglobin: 0.7 % (ref 0.0–1.5)
O2 SAT: 49.5 %
O2 Saturation: 60.7 %
O2 Saturation: 53.7 %
TOTAL HEMOGLOBIN: 14.9 g/dL (ref 12.0–16.0)
Total hemoglobin: 14.7 g/dL (ref 12.0–16.0)
Total hemoglobin: 14.3 g/dL (ref 12.0–16.0)

## 2016-12-26 LAB — CBC WITH DIFFERENTIAL/PLATELET
Basophils Absolute: 0 10*3/uL (ref 0.0–0.1)
Basophils Relative: 0 %
Eosinophils Absolute: 0.2 10*3/uL (ref 0.0–0.7)
Eosinophils Relative: 3 %
HEMATOCRIT: 47.3 % — AB (ref 36.0–46.0)
HEMOGLOBIN: 15.4 g/dL — AB (ref 12.0–15.0)
LYMPHS PCT: 10 %
Lymphs Abs: 0.7 10*3/uL (ref 0.7–4.0)
MCH: 30.3 pg (ref 26.0–34.0)
MCHC: 32.6 g/dL (ref 30.0–36.0)
MCV: 92.9 fL (ref 78.0–100.0)
MONO ABS: 0.7 10*3/uL (ref 0.1–1.0)
Monocytes Relative: 10 %
NEUTROS ABS: 5.5 10*3/uL (ref 1.7–7.7)
Neutrophils Relative %: 77 %
Platelets: 150 10*3/uL (ref 150–400)
RBC: 5.09 MIL/uL (ref 3.87–5.11)
RDW: 16.1 % — ABNORMAL HIGH (ref 11.5–15.5)
WBC: 7.2 10*3/uL (ref 4.0–10.5)

## 2016-12-26 LAB — GLUCOSE, CAPILLARY
GLUCOSE-CAPILLARY: 121 mg/dL — AB (ref 65–99)
GLUCOSE-CAPILLARY: 135 mg/dL — AB (ref 65–99)
GLUCOSE-CAPILLARY: 199 mg/dL — AB (ref 65–99)
GLUCOSE-CAPILLARY: 67 mg/dL (ref 65–99)
GLUCOSE-CAPILLARY: 72 mg/dL (ref 65–99)
GLUCOSE-CAPILLARY: 82 mg/dL (ref 65–99)
Glucose-Capillary: 145 mg/dL — ABNORMAL HIGH (ref 65–99)
Glucose-Capillary: 65 mg/dL (ref 65–99)
Glucose-Capillary: 75 mg/dL (ref 65–99)

## 2016-12-26 LAB — HEMOGLOBIN A1C
Hgb A1c MFr Bld: 11.5 % — ABNORMAL HIGH (ref 4.8–5.6)
MEAN PLASMA GLUCOSE: 283 mg/dL

## 2016-12-26 LAB — URINE CULTURE: Culture: >=100000 — AB

## 2016-12-26 LAB — LACTIC ACID, PLASMA: LACTIC ACID, VENOUS: 2.4 mmol/L — AB (ref 0.5–1.9)

## 2016-12-26 LAB — MAGNESIUM: Magnesium: 2.5 mg/dL — ABNORMAL HIGH (ref 1.7–2.4)

## 2016-12-26 LAB — HEPARIN LEVEL (UNFRACTIONATED): HEPARIN UNFRACTIONATED: 0.64 [IU]/mL (ref 0.30–0.70)

## 2016-12-26 LAB — PHOSPHORUS: Phosphorus: 5 mg/dL — ABNORMAL HIGH (ref 2.5–4.6)

## 2016-12-26 LAB — CG4 I-STAT (LACTIC ACID): Lactic Acid, Venous: 1.82 mmol/L (ref 0.5–1.9)

## 2016-12-26 MED ORDER — LEVOTHYROXINE SODIUM 100 MCG IV SOLR: 50.0000 ug | Freq: Every day | INTRAVENOUS | Status: DC

## 2016-12-26 MED ORDER — HEPARIN (PORCINE) IN NACL 100-0.45 UNIT/ML-% IJ SOLN
850.0000 [IU]/h | INTRAMUSCULAR | Status: DC
  Administered 2016-12-26: 1000 [IU]/h via INTRAVENOUS
  Administered 2016-12-27: 850 [IU]/h via INTRAVENOUS
  Filled 2016-12-26 (×2): qty 250

## 2016-12-26 MED ORDER — INSULIN ASPART 100 UNIT/ML ~~LOC~~ SOLN: 0.0000 [IU] | SUBCUTANEOUS | Status: DC

## 2016-12-26 MED ORDER — GERHARDT'S BUTT CREAM
TOPICAL_CREAM | Freq: Two times a day (BID) | CUTANEOUS | Status: DC
  Administered 2016-12-26: via TOPICAL
  Administered 2016-12-27: 1 via TOPICAL
  Administered 2016-12-28 – 2017-01-04 (×12): via TOPICAL
  Filled 2016-12-26: qty 1

## 2016-12-26 MED ORDER — AMIODARONE HCL IN DEXTROSE 360-4.14 MG/200ML-% IV SOLN
30.0000 mg/h | INTRAVENOUS | Status: DC
  Administered 2016-12-26 – 2017-01-06 (×23): 30 mg/h via INTRAVENOUS
  Filled 2016-12-26 (×23): qty 200

## 2016-12-26 MED ORDER — FUROSEMIDE 10 MG/ML IJ SOLN
80.0000 mg | Freq: Two times a day (BID) | INTRAMUSCULAR | Status: DC
Start: 2016-12-26 — End: 2016-12-26
  Administered 2016-12-26: 80 mg via INTRAVENOUS
  Filled 2016-12-26: qty 8

## 2016-12-26 MED ORDER — DEXTROSE 5 % IV SOLN
500.0000 mg | Freq: Three times a day (TID) | INTRAVENOUS | Status: AC
  Administered 2016-12-26 – 2016-12-29 (×12): 500 mg via INTRAVENOUS
  Filled 2016-12-26 (×13): qty 0.5

## 2016-12-26 MED ORDER — SODIUM CHLORIDE 0.9% FLUSH
10.0000 mL | INTRAVENOUS | Status: DC | PRN
  Administered 2017-01-05: 30 mL
  Filled 2016-12-26: qty 40

## 2016-12-26 MED ORDER — LEVOTHYROXINE SODIUM 100 MCG PO TABS
100.0000 ug | ORAL_TABLET | Freq: Every day | ORAL | Status: DC
  Administered 2016-12-26: 100 ug via ORAL
  Filled 2016-12-26 (×2): qty 1

## 2016-12-26 MED ORDER — FUROSEMIDE 10 MG/ML IJ SOLN
120.0000 mg | Freq: Once | INTRAVENOUS | Status: AC
  Administered 2016-12-26: 120 mg via INTRAVENOUS
  Filled 2016-12-26: qty 12

## 2016-12-26 MED ORDER — ORAL CARE MOUTH RINSE
15.0000 mL | Freq: Two times a day (BID) | OROMUCOSAL | Status: DC
  Administered 2016-12-26 – 2016-12-29 (×4): 15 mL via OROMUCOSAL

## 2016-12-26 MED ORDER — SODIUM CHLORIDE 0.9 % IV SOLN
INTRAVENOUS | Status: DC | PRN
  Administered 2016-12-30: 20:00:00 via INTRA_ARTERIAL

## 2016-12-26 MED ORDER — DEXTROSE 50 % IV SOLN
INTRAVENOUS | Status: AC
  Administered 2016-12-26: 25 mL via INTRAVENOUS
  Filled 2016-12-26: qty 50

## 2016-12-26 MED ORDER — DOBUTAMINE IN D5W 4-5 MG/ML-% IV SOLN
2.5000 ug/kg/min | INTRAVENOUS | Status: DC
  Administered 2016-12-26: 2.5 ug/kg/min via INTRAVENOUS
  Filled 2016-12-26: qty 250

## 2016-12-26 MED ORDER — INSULIN ASPART 100 UNIT/ML ~~LOC~~ SOLN
0.0000 [IU] | SUBCUTANEOUS | Status: DC
  Administered 2016-12-26 – 2016-12-27 (×2): 1 [IU] via SUBCUTANEOUS
  Administered 2016-12-27 (×4): 2 [IU] via SUBCUTANEOUS
  Administered 2016-12-27: 1 [IU] via SUBCUTANEOUS

## 2016-12-26 MED ORDER — ALPRAZOLAM 0.25 MG PO TABS
0.1250 mg | ORAL_TABLET | Freq: Every evening | ORAL | Status: DC | PRN
  Administered 2016-12-26 – 2017-01-05 (×10): 0.125 mg via ORAL
  Filled 2016-12-26 (×10): qty 1

## 2016-12-26 MED ORDER — DEXTROSE 50 % IV SOLN
25.0000 mL | Freq: Once | INTRAVENOUS | Status: AC
  Administered 2016-12-26: 25 mL via INTRAVENOUS

## 2016-12-26 MED ORDER — ASPIRIN EC 81 MG PO TBEC
81.0000 mg | DELAYED_RELEASE_TABLET | Freq: Every day | ORAL | Status: DC
  Administered 2016-12-27 – 2016-12-31 (×5): 81 mg via ORAL
  Filled 2016-12-26 (×5): qty 1

## 2016-12-26 MED ORDER — INSULIN DETEMIR 100 UNIT/ML ~~LOC~~ SOLN
5.0000 [IU] | Freq: Every day | SUBCUTANEOUS | Status: DC
  Administered 2016-12-26 – 2016-12-27 (×2): 5 [IU] via SUBCUTANEOUS
  Filled 2016-12-26 (×2): qty 0.05

## 2016-12-26 MED ORDER — CHLORHEXIDINE GLUCONATE CLOTH 2 % EX PADS
6.0000 | MEDICATED_PAD | Freq: Every day | CUTANEOUS | Status: DC
  Administered 2016-12-26 – 2017-01-04 (×10): 6 via TOPICAL

## 2016-12-26 MED ORDER — SODIUM CHLORIDE 0.9% FLUSH
10.0000 mL | Freq: Two times a day (BID) | INTRAVENOUS | Status: DC
  Administered 2016-12-26: 10 mL
  Administered 2016-12-26: 20 mL
  Administered 2016-12-27: 10 mL
  Administered 2016-12-29: 30 mL
  Administered 2016-12-30: 20 mL
  Administered 2016-12-30: 30 mL
  Administered 2016-12-31: 10 mL
  Administered 2016-12-31: 20 mL
  Administered 2017-01-01 – 2017-01-03 (×4): 10 mL
  Administered 2017-01-04: 20 mL
  Administered 2017-01-04 – 2017-01-05 (×2): 30 mL

## 2016-12-26 NOTE — Consult Note (Signed)
Advanced Heart Failure Team Consult Note  Referring Physician: Dr Elsworth Soho Primary Physician:Dr. Carlota Raspberry Primary Cardiologist:  Dr. Harrington Challenger  Reason for Consultation: Cardiogenic shock   HPI:    [name] is a 71 y.o. female with history of CAD s/p CABG, chronic combined CHF, moderate aortic stenosis, DM, HLD, PVD followed by Dr. Donnetta Hutching s/p L foot transmetatarsal amputation, hypothyroidism, syncope, CKD stage III-IV, and Iron/B12 deficiency.  Pt presented to [facility] 12/16/2016 with edema and low urine output. Pt also had syncopal episode 12/11/16. Recently finished doxycycline for cellulitis. + orthopnea and PND. + edema and abomdinal distention. Pertinent labs on admisison include Creatinine 2.2, BUN 80.  Baseline creatinine 1.4-1.6.  CXR with moderate right pleural effusion with possible pulmonary consolidation.   Admitted for PNA and ABX started. BNP 706. Troponin flat. TSH markedly elevated. D-dimer +. NQ scan pending. Work up also significant for new Afib by EKG and tele.   Pt developed hypotension and central line placed. Antihypertensives held. Started on Dobutamine with EF drop to 15% from 25-30% on last echo.   Feeling overwhelmed and anxious.  Denies SOB currently. Still c/o marked orthopnea. Feels "claustrophobic" and like she is smothering when she lies back. Uses wheelchair at home. Can only walk a maximum of 200 feet with walker. Had been working with PT. Has been more orthopneic and had decreased urine output gradually over the past month.  States she took torsemide this weekend with little to no urine output. Denies lightheadedness or syncope apart from episode 12/11/16.   Had similar episodes of syncope in 2016, cardiac monitor unremarkable. Recent illness includes viral syndrome after Flu shout a month or so ago.   Coox 49% on dobutamine 2.5 mcg.   Increase to 5 mcg. CVP 20 sitting on edge of bed.  Echo 12/25/16 LVEF 15% with severely reduced RV function. Mild MR, Moderate  TI Compared to -> Echo 07/2015 LVEF 25-30%, RVEF mod depressed, mod aortic stenosis  Review of Systems completed and negative except for as listed above in HPI.   Past medical history 1. CAD s/p CABG in 1993. - 09/2011 showed patent LIMA to LAD with occlusion of distal LAD; High grade stenosis of nondominant RCA; Signif disease of prox LCx; SVG to L PDA occluded, SVG to OM patent), Aortic stenosis, DM, dyslipidemia, POVD, s/p revascularization - followed by Dr. Donnetta Hutching, CKD.  - 10/2011 Pt underwent attempt at PTCA prox LCx by Dr. Lia Foyer. Procedure was unsuccessful and aborted.  2. Chronic systolic CHF due to ICM - Echo 07/2015 LVEF 25-30%, RVEF mod depressed, mod aortic stenosis - Echo 12/25/16 LVEF 15% with severely reduced RV function. Mild MR, Moderate TI 3. Moderate aortic stenosis 4. DM2 5. PVD  - Followed by Dr. Donnetta Hutching s/p L foot transmetatarsal amputation 6. Hypothyroidism 7. CKD stage III-IV - Follows with nephrology 8. Iron/B12 deficiency.  Past Medical History: Past Medical History:  Diagnosis Date  . Anemia   . Anginal pain ([place])    "history; not currently" (08/28/2012)  . Aortic stenosis    0.8cm2 on echo in 10/2011  . Arthritis    "hands, hips, all over" (08/28/2012  . B12 deficiency anemia    B 12 injection "monthly since 05/2012" (08/28/2012)  . Bilateral carpal tunnel syndrome 1970s  . Blood transfusion    no reaction from transfusion; "when I had heart surgery" (08/28/2012)  . Breast cancer ([place])    left  . CAD (coronary artery disease)    s/p CABG x3 in  1993; last cath in 2010  . Cat bite 2009   with MRSA; required I & D  . Cellulitis   . Cellulitis June 2013, Nov. 2013 and Feb. 14, 2014   Left leg  . CHF (congestive heart failure) ([place]) Aug. 2012   Echo 10/2011: Mild LVH, EF 30%, apex akinetic, septal HK, grade 2 diastolic dysfunction, or functionally bicuspid aortic valve, mild aortic stenosis by gradient, severe by calculated AVA-suspect A. Aortic stenosis  probably moderate, trivial MR, mild LAE, mild RAE.   [name] Claudication ([place])   . Exertional dyspnea    "because of the heart failure" (08/28/2012)  . GERD (gastroesophageal reflux disease)   . Heart murmur   . History of bronchitis    "I've had it twice in my life" (08/28/2012)  . History of recurrent UTIs   . HTN (hypertension)   . Hyperlipidemia    on Lipitor  . Hypothyroidism   . Iron deficiency anemia 06/13/2012   "iron infusion" (08/28/2012  . Myocardial infarction 1986   "silent"  . Osteoarthritis of both feet   . Osteomyelitis ([place])    left foot  . PVD (peripheral vascular disease) ([place])    Toes amputated from left foot and has had prior bypass on left leg. Followed by Dr. Donnetta Hutching  . Scleroderma ([place])   . Seizure ([place])    ? seizure like event in 2010; workup included stress testing which led to repeat cath.   . Stomach ulcer 1981?   "on Tagament for ~ 1 yr" in 4s  . Type 1 diabetes ([place]) 10/1949    Past Surgical History: Past Surgical History:  Procedure Laterality Date  . AMPUTATION Right 05/27/2013   Procedure: AMPUTATION DIGIT FIFTH TOE;  Surgeon: Rosetta Posner, MD;  Location: [facility] OR;  Service: Vascular;  Laterality: Right;  . AMPUTATION Left 03/21/2016   Procedure: Left Transmetatarsal Amputation;  Surgeon: Newt Minion, MD;  Location: [place];  Service: Orthopedics;  Laterality: Left;  . APPENDECTOMY  1991  . BREAST BIOPSY    . BREAST BIOPSY WITH SENTINEL LYMPH NODE BIOPSY AND NEEDLE LOCALIZATION  1984  . CARDIAC CATHETERIZATION  2010   LIMA to LAD patent yet with occluded distal LAD after graft insertion & retrograde is occluded. 3-4 septal perforators arise &are patent. SVG to a large marginal patent; SVG presumably to the distal dominant LCX is occluded, moderately high grade stenosis of the AV circumflex, but with distal stenoses involving a severely diseased marginal branch not amenable  to PCI  nor is inferior branch   . CARDIAC CATHETERIZATION  10/2011    "unsuccessful attempt of stenting" (08/28/2012)  . CARDIAC CATHETERIZATION  1992; 09/2011  . CARPAL TUNNEL RELEASE  ~ 1970's   bilaterally  . CATARACT EXTRACTION W/ INTRAOCULAR LENS  IMPLANT, BILATERAL  1990's  . CORONARY ARTERY BYPASS GRAFT  1992   LIMA to LAD, SVG to distal LCX & SVG to Marginal  . FEMORAL BYPASS     left leg "related to transmetatarsal amputation" (08/28/2012)  . INCISION AND DRAINAGE OF WOUND  ~ 1967   "infection in left foot" (08/28/2012)  . INCISION AND DRAINAGE OF WOUND  2009   "3 ORs on my right hand after cat bite" (08/28/2012)  . LEFT AND RIGHT HEART CATHETERIZATION WITH CORONARY ANGIOGRAM N/A 09/17/2011   Procedure: LEFT AND RIGHT HEART CATHETERIZATION WITH CORONARY ANGIOGRAM;  Surgeon: Hillary Bow, MD;  Location[facility] CATH LAB;  Service: Cardiovascular;  Laterality: N/A;  . LOWER EXTREMITY  ANGIOGRAM N/A 05/25/2013   Procedure: LOWER EXTREMITY ANGIOGRAM POSS INTERVENTION;  Surgeon: Conrad Cash, MD;  Location: [facility] CATH LAB;  Service: Cardiovascular;  Laterality: N/A;  . MASTECTOMY  11/1982   Left Breast  . PERCUTANEOUS CORONARY STENT INTERVENTION (PCI-S) N/A 11/15/2011   Procedure: PERCUTANEOUS CORONARY STENT INTERVENTION (PCI-S);  Surgeon: Hillary Bow, MD;  Location[facility] CATH LAB;  Service: Cardiovascular;  Laterality: N/A;  . TONSILLECTOMY  1952   "?adenoids"   . TRANSMETATARSAL AMPUTATION  04/2009 - 10/2009   "4 ORs; left foot"  . TRIGGER FINGER RELEASE  1980's   right thumb  . TUBAL LIGATION  1984  . VITRECTOMY  1990's-2004   "both eyes; I've had total of 4"    Family History: Family History  Problem Relation Age of Onset  . Diabetes Mother   . Stroke Mother   . Hypertension Mother   . Stroke Father   . Heart attack Neg Hx     Social History: Social History   Social History  . Marital status: Divorced    Spouse name: N/A  . Number of children: N/A  . Years of education: N/A   Occupational History  . Web designer     Social History Main Topics  . Smoking status: Never Smoker  . Smokeless tobacco: Never Used  . Alcohol use No  . Drug use: No  . Sexual activity: Not Currently    Birth control/ protection: Post-menopausal   Other Topics Concern  . None   Social History Narrative  . None    Allergies:  Allergies  Allergen Reactions  . Cephalexin Swelling and Rash  . Ciprofloxacin Swelling and Rash  . Zyvox [Linezolid] Other (See Comments)    Thrombocytopenia developed to 50k after several weeks of therapy  . Adhesive [Tape] Other (See Comments)    "old [employer] adhesive tape; takes my skin off; can use paper tape"  . Bactrim [Sulfamethoxazole-Trimethoprim] Other (See Comments)    Hyperkalemia (elevated potassium) and Increased creatinine  . Daptomycin Other (See Comments)    Had elevated CPK on therapy, not clear that this was due to cubicin  . Vancomycin Other (See Comments)    ? If this played role in her Acute kidney injury    Objective:    Vital Signs:   Temp:  [96.1 F (35.6 C)-98.6 F (37 C)] 96.1 F (35.6 C) (03/14 0430) Pulse Rate:  [63-107] 90 (03/14 0715) Resp:  [0-25] 13 (03/14 0715) BP: (58-132)/(28-88) 76/66 (03/14 0115) SpO2:  [79 %-100 %] 99 % (03/14 0715) Arterial Line BP: (72-109)/(49-64) 109/62 (03/14 0715) Weight:  [158 lb 15.2 oz (72.1 kg)-175 lb 11.3 oz (79.7 kg)] 175 lb 11.3 oz (79.7 kg) (03/14 0430) Last BM Date: 01/05/2017 (per pt )  Weight change: Filed Weights   12/26/16 0247 12/26/16 0430  Weight: 158 lb 15.2 oz (72.1 kg) 175 lb 11.3 oz (79.7 kg)    Intake/Output:   Intake/Output Summary (Last 24 hours) at 12/26/16 0839 Last data filed at 12/26/16 0700  Gross per 24 hour  Intake          1405.17 ml  Output              400 ml  Net          1005.17 ml     Physical Exam: General:  Elderly appearing. NAD at rest. Anxious. Tearful.  HEENT: Nasal cannula in place.  Neck: supple. JVP difficult.  Appears at least 14-16 cm. Carotids 2+  bilat; no bruits. No lymphadenopathy or thyromegaly appreciated. Cor: PMI nondisplaced. Irregular. No S3/S4. No murmur appreciated.  Lungs: Diminished throughout.  Abdomen: Obese, distended, edematous. 1+ pitting. No hepatosplenomegaly. No bruits or masses. Good bowel sounds. Extremities: no cyanosis, clubbing, rash. 2+ edema into flanks.  S/p L foot transmetatarsal amputation.  Neuro: alert & orientedx3, cranial nerves grossly intact. moves all 4 extremities w/o difficulty. Affect pleasant  Telemetry: Reviewed personally, Aflutter 80s  Labs: Basic Metabolic Panel:  Recent Labs Lab 12/16/2016 1505 01/08/2017 2016 12/25/16 0705 12/26/16 0201  NA 132*  --  133* 132*  K 4.4  --  3.9 4.7  CL 97*  --  99* 98*  CO2 23  --  22 23  GLUCOSE 369*  --  134*  136* 61*  BUN 80*  --  76* 75*  CREATININE 2.20*  --  1.99* 2.41*  CALCIUM 9.1  --  8.9 8.9  MG  --  2.2 2.2 2.5*  PHOS  --   --  4.3 5.0*    Liver Function Tests:  Recent Labs Lab 01/05/2017 2016 12/25/16 0705 12/26/16 0201  AST 24 23 30   ALT 15 13* 15  ALKPHOS 115 104 112  BILITOT 1.1 1.1 0.7  PROT 7.3 6.5 6.8  ALBUMIN 3.1* 2.9* 2.9*   No results for input(s): LIPASE, AMYLASE in the last 168 hours. No results for input(s): AMMONIA in the last 168 hours.  CBC:  Recent Labs Lab 01/12/2017 1505 12/25/16 0705 12/26/16 0201  WBC 5.7 6.5 7.2  NEUTROABS  --   --  5.5  HGB 14.6 14.2 15.4*  HCT 44.5 43.8 47.3*  MCV 92.5 92.0 92.9  PLT 152 147* 150    Cardiac Enzymes:  Recent Labs Lab 01/04/2017 2016 12/25/16 0156 12/25/16 0705  TROPONINI 0.06* 0.06* 0.06*    BNP: BNP (last 3 results)  Recent Labs  10/12/16 1026 12/10/16 1431 12/21/2016 2016  BNP 1,200.9* 1,134.8* 706.0*    ProBNP (last 3 results)  Recent Labs  12/10/16 1431  PROBNP 21,500*     CBG:  Recent Labs Lab 12/26/16 0009 12/26/16 0049 12/26/16 0258 12/26/16 0517 12/26/16 0639  GLUCAP 72 75 82 67 135*    Coagulation Studies: No  results for input(s): LABPROT, INR in the last 72 hours.  Other results:  EKG: Afib at 103 bpm.   Imaging: Dg Chest Port 1 View  Result Date: 12/26/2016 CLINICAL DATA:  Central line placement . EXAM: PORTABLE CHEST 1 VIEW COMPARISON:  01/08/2017.  07/29/2015. FINDINGS: Interim placement right IJ line, its tips is at the cavoatrial junction. Prior CABG. Cardiomegaly. Diffuse right lung infiltrate and moderate right pleural effusion. Small left pleural effusion. No pneumothorax. IMPRESSION: 1. Right IJ line noted with tip at the cavoatrial junction . No pneumothorax. 2.  Prior CABG.  Stable cardiomegaly. 3. Diffuse right lung infiltrate and/or edema with moderate right pleural effusion noted on today's exam. Small left pleural effusion. Electronically Signed   By: Marcello Moores  Register   On: 12/26/2016 07:25   Dg Abd Portable 1v  Result Date: 12/26/2016 CLINICAL DATA:  Abdominal distention tonight. EXAM: PORTABLE ABDOMEN - 1 VIEW COMPARISON:  CT abdomen and pelvis 04/01/2006 FINDINGS: The entire abdomen is not included within the field of view. Visualized abdominal contents demonstrate scattered gas and stool in the colon with scattered gas-filled small bowel. No small or large bowel dilatation. Somewhat hazy appearance of the abdomen may indicate ascites. No radiopaque stones. Vascular calcifications. Probable right pleural effusion with basilar  atelectasis. IMPRESSION: No evidence of bowel obstruction. Possible abdominal ascites. Small right pleural effusion with basilar atelectasis. Electronically Signed   By: Lucienne Capers M.D.   On: 12/26/2016 04:03      Medications:     Current Medications: . aspirin EC  325 mg Oral Daily  . atorvastatin  40 mg Oral QHS  . aztreonam  500 mg Intravenous Q8H  . Chlorhexidine Gluconate Cloth  6 each Topical Daily  . guaiFENesin  600 mg Oral BID  . insulin aspart  0-15 Units Subcutaneous Q4H  . levothyroxine  100 mcg Oral QAC breakfast  . mouth rinse  15  mL Mouth Rinse BID  . senna-docusate  1 tablet Oral Daily  . sodium chloride flush  10-40 mL Intracatheter Q12H  . sodium chloride flush  3 mL Intravenous Q12H     Infusions: . sodium chloride 50 mL/hr at 12/26/16 0548  . DOBUTamine 2.5 mcg/kg/min (12/26/16 0302)  . heparin Stopped (12/26/16 0320)      Assessment/Plan   SHARNELL KNIGHT is a 71 y.o. female CAD s/p CABG, chronic combined CHF, moderate aortic stenosis, DM, HLD, PVD followed by Dr. Donnetta Hutching s/p L foot transmetatarsal amputation, hypothyroidism, syncope, CKD stage III-IV, and Iron/B12 deficiency.  Admitted for edema and poor urine output.  Found to be hypotensive and in cardiogenic shock. Also found to be in [place] Afib. ? Infection origin/early + UA, UCx with E. Coli and CXR concerning for PNA.  1. Acute on chronic biventricular CHF - LVEF 15% with severely reduced RV function. Decreased from 2016 at 25-30%. - Pt is markedly volume overloaded on exam.  - CVP 15. Needs to be diuresed. Will start on 80 mg IV lasix BID and follow response.  Watch pressures. May need to add levophed to tolerate diuresis.  - No room for ACE/ARB with hypotension - No BB with low output and negative inotropy.  - Coox 49% on dobutamine 2.5 mcg/kg/min. Will increase to 5 mcg/kg/min and follow coox.  - ? If worsening of EF tachy-mediated with new Afib - Would not be candidate for advanced therapies including LVAD with RV failure and  2. New Afib/Flutter  - Started on IV amiodarone yesterday but stopped with hypotension.  Rate much improved. Need to resume at least po amiodarone. If does not chemically convert, will need to consider TEE/DCCV once stable.   EKG pending. - This patients CHA2DS2-VASc Score is 6 ( CHF, HTN, DM, Vascular disease, Aged 45, Female)   - Continue heparin. Will need chronic anticoag.  3. CAD s/p CABG - See cath report above.  Has severe disease not amenable to revascularization by cath 09/2011 - Cath 09/2011 patent LIMA to LAD  with occlusion of distal LAD; High grade stenosis of nondominant RCA; Signif disease of prox LCx; SVG to L PDA occluded, SVG to OM patent) - Cont ASA but drop at 81 mg and atorvastatin 40 mg daily. 4. Acute on chronic respiratory failure - Increased 02 demand. Continue nasal cannula. Appreciate CCM.  5. ARF on CKD III - Think likely due to low output/ATN with hypotension.  Need to diurese. Will follow closely - Will stop IVF for now and watch closely.  6. Aortic stenosis - At least mild on Echo, possibly worse. Could quantify further with TEE if needed.   7. ID - On Aztreonam for +UTI.  Per CCM low concern for PNA.  - Unclear picture for hypotension. ? Early sepsis vs low output.  - Lactate 2.4 -> 1.8.  Length of Stay: 2  Annamaria Helling  12/26/2016, 8:39 AM  Advanced Heart Failure Team Pager [phone] (M-F; 7a - 4p)  Please contact [facility] for night-coverage after hours (4p -7a ) and weekends on [email or web address]  1. Acute on chronic systolic CHF: Ischemic cardiomyopathy.  Echo with admission with EF 15%, severely decreased RV systolic function, mid to moderate aortic stenosis.  She is volume overloaded with CVP 20.  Low output HF with co-ox 49.5% this morning on dobutamine 2.5 (dobutamine started overnight with low BP). It is possible that the onset of atrial fibrillation/flutter led to decompensation of CHF.  Her volume overload actually appears fairly chronic in the legs. No chest pain.  Not very mobile, mostly in wheelchair.  She does not have an ICD. Creatinine up to 2.41, suspect cardiorenal syndrome.  - Increase dobutamine to 5 mcg/kg/min (HR tolerating ok), repeat co-ox.  BP is stable, may be better with milrinone eventually, will follow.  - Stop IV fluid, she is markedly volume overloaded.  Poor UOP likely due to cardiorenal syndrome.  - Start Lasix 80 mg IV bid.  - Does not have an ICD.  Has chronic wounds in legs so probably not good candidate right now.  Narrow QRS,  so not candidate for CRT.  - She would be poor candidate for advanced therapies with very limited ambulation (mostly confined to wheelchair but does some walking for short distances).  2. Atrial fibrillation versus atypical flutter: This morning, appears to be in atypical atrial flutter. Afib/flutter with RVR may have led to decompensation of CHF.  She is currently rate-controlled, had been on amiodarone but this was stopped when BP dropped overnight.  - Would restart amiodarone gtt at 30 mg/hr.   - Continue heparin gtt.  With labile renal function, will not start DOAC yet.  Eventually, DOAC would be ideal agent.  Once she is diuresed and we have hopefully weaned down on inotrope, would attempt TEE-guided DCCV.  3. AKI on CKD stage 3: Baseline creatinine 1.4-1.6.  Up to 2.4 this morning.  Suspect cardiorenal syndrome.  She is volume overloaded.  - Increase dobutamine to improve cardiac output.  - Attempt to diurese.   4. CAD: No chest pain.  Last cath in 12/12 as above.  Attempted PTCA of proximal LCx but failed.  Thought to have no good revascularization options.  Continue ASA 81 and statin for now.  5. PAD: Extensive, especially in the left leg.  She is s/p left transmetatarsal amputation.  This has led to her limited mobility.  Followed by [facility].  6. UTI: Treating with aztreonam.  7. Right pleural effusion: Noted on CXR.  Will diurese as above, may need thoracentesis.  8. Aortic stenosis: Mild to possibly moderate on echo.   Loralie Champagne 12/26/2016 10:37 AM

## 2016-12-26 NOTE — Evaluation (Signed)
Physical Therapy Evaluation Patient Details Name: [name] MRN: [id number] DOB: 09-27-1946 Today's Date: 12/26/2016   History of Present Illness  Pt is a 71 y.o. female who presented to the ED with edema, concerns of decreased urine output, and creatinine elevation. Additionally she reports a "flulike reaction" following influenza vaccination a few days prior to this. She had developed wounds on B knees. She presented with shortness of breath and weakness. Pt transferred to SDU 12/25/12.  PMH significant for: anemia, anginal pain, aortic stenosis, arthritis, B12 deficiency anemia, B carpal tunnel syndrome, blood transfusion, breast cancer, CAD, CHF, cellulitis, claudication, exertional dyspnea, GERD, heart murmur, HTN, hyperlipidemia, hypothyroidism, iron deficiency anemia, myocardial infarction, osteomyelitis L foot, PVD, scleroderma, seizure, stomach ulcer, type 1 DM.  Clinical Impression  Patient demonstrates deficits in functional mobility as indicated below. Will need continued skilled PT to address deficits and maximize function. Will see as indicated and progress as tolerated.     Follow Up Recommendations SNF;Supervision/Assistance - 24 hour    Equipment Recommendations  None recommended by PT    Recommendations for Other Services       Precautions / Restrictions Precautions Precautions: Fall Restrictions Weight Bearing Restrictions: No      Mobility  Bed Mobility               General bed mobility comments: Received in chair.  Transfers Overall transfer level: Needs assistance Equipment used:  (stedy) Transfers: Sit to/from Stand Sit to Stand: +2 physical assistance;Mod assist         General transfer comment: assist of pad under hips and gait belt under axilla to stand x 1  Ambulation/Gait             General Gait Details: unable to perform  Stairs            Wheelchair Mobility    Modified Rankin (Stroke Patients Only)        Balance Overall balance assessment: Needs assistance   Sitting balance-Leahy Scale: Fair     Standing balance support: Bilateral upper extremity supported Standing balance-Leahy Scale: Poor Standing balance comment: stood with stedy                             Pertinent Vitals/Pain Pain Assessment: Faces Faces Pain Scale: Hurts whole lot Pain Location: knees, R thighs Pain Descriptors / Indicators: Grimacing;Guarding;Moaning Pain Intervention(s): Monitored during session    Home Living Family/patient expects to be discharged to:: Private residence Living Arrangements: Alone Available Help at Discharge: Friend(s);Available PRN/intermittently Type of Home: House Home Access: Stairs to enter Entrance Stairs-Rails: Right;Left Entrance Stairs-Number of Steps: 6 Home Layout: Two level;Able to live on main level with bedroom/bathroom Home Equipment: Walker - 4 wheels;Wheelchair - manual;Shower seat - built in;Grab bars - tub/shower;Hand held shower head;Adaptive equipment Additional Comments: Steps in garage have higher rise; front steps are easier to ascend    Prior Function Level of Independence: Independent with assistive device(s)               Hand Dominance   Dominant Hand: Right    Extremity/Trunk Assessment        Lower Extremity Assessment Lower Extremity Assessment: Generalized weakness;RLE deficits/detail;LLE deficits/detail RLE Deficits / Details: bilateral knee wounds and limited ROM RLE: Unable to fully assess due to pain LLE Deficits / Details: bilateral knee wounds and limited ROM LLE: Unable to fully assess due to pain    Cervical / Trunk Assessment  Cervical / Trunk Assessment:  (increased bodyu habitus)  Communication   Communication: No difficulties  Cognition Arousal/Alertness: Awake/alert Behavior During Therapy: Anxious Overall Cognitive Status: Within Functional Limits for tasks assessed                 General  Comments: pt reports claustraphobia, works well when involved in plan for session due to anxiety    General Comments General comments (skin integrity, edema, etc.): patient with noted rash under right breast, wounds and edema Bilateral LEs    Exercises     Assessment/Plan    PT Assessment Patient needs continued PT services  PT Problem List Decreased strength;Decreased range of motion;Decreased activity tolerance;Decreased balance;Decreased mobility;Decreased coordination;Decreased safety awareness;Obesity;Decreased skin integrity;Pain       PT Treatment Interventions DME instruction;Gait training;Functional mobility training;Therapeutic activities;Therapeutic exercise;Balance training;Neuromuscular re-education;Patient/family education    PT Goals (Current goals can be found in the Care Plan section)  Acute Rehab PT Goals Patient Stated Goal: to go home soon and feel better PT Goal Formulation: With patient Time For Goal Achievement: 01/09/17 Potential to Achieve Goals: Fair    Frequency Min 2X/week   Barriers to discharge Decreased caregiver support      Co-evaluation PT/OT/SLP Co-Evaluation/Treatment: Yes Reason for Co-Treatment: For patient/therapist safety PT goals addressed during session: Mobility/safety with mobility OT goals addressed during session: ADL's and self-care       End of Session Equipment Utilized During Treatment: Gait belt Activity Tolerance: Patient limited by pain;Other (comment) (anxiety) Patient left: in chair;with call bell/phone within reach;with family/visitor present Nurse Communication: Mobility status PT Visit Diagnosis: Muscle weakness (generalized) (M62.81)         Time: 3614-4315 PT Time Calculation (min) (ACUTE ONLY): 39 min   Charges:   PT Evaluation $PT Eval Moderate Complexity: 1 Procedure     PT G Codes:         Duncan Dull 2017/01/17, [place], [place] DPT  [phone]

## 2016-12-26 NOTE — Progress Notes (Addendum)
Inpatient Diabetes Program Recommendations  AACE/ADA: New Consensus Statement on Inpatient Glycemic Control (2015)  Target Ranges:  Prepandial:   less than 140 mg/dL      Peak postprandial:   less than 180 mg/dL (1-2 hours)      Critically ill patients:  140 - 180 mg/dL   Lab Results  Component Value Date   GLUCAP 145 (H) 12/26/2016   HGBA1C 11.5 (H) 12/25/2016    Review of Glycemic ControlResults for AVAMARIE, CROSSLEY (MRN [id number]) as of 12/26/2016 16:41  Ref. Range 12/26/2016 02:58 12/26/2016 05:17 12/26/2016 06:39 12/26/2016 08:31 12/26/2016 13:18  Glucose-Capillary Latest Ref Range: 65 - 99 mg/dL 82 67 135 (H) 121 (H) 145 (H)    Diabetes history: Type 1 diabetes for 67 years Outpatient Diabetes medications:  Levemir 10 units bid, Humalog 1-12 units four times a day Current orders for Inpatient glycemic control:  Novolog moderate tid q 4 hours  Inpatient Diabetes Program Recommendations:    Note patient has had no  Basal insulin for > 36 hours.  She is getting Novolog moderate correction q 4 hours however she is often refusing due to hypoglycemia concerns.    Please consider reducing Novolog correction to custom scale since patient is very sensitive to insulin at this time:  151-200 mg/dL- 1 unit 201-250 mg/dL-2 units 251-300 mg/dL-3 units 301-350 mg/dL-4 units 351-400 mg/dL-5 units >401 mg/dL- call MD  Also please consider adding Levemir 5 units q HS.  Discussed with patient and RN.  Paged MD to discuss.  Will follow.  Thanks, Adah Perl, RN, BC-ADM Inpatient Diabetes Coordinator Pager [phone]- Spoke with Dr. Oletta Darter in [place] and orders received.

## 2016-12-26 NOTE — Progress Notes (Signed)
Pharmacy Antibiotic Note  [name] is a 71 y.o. female admitted on 01/01/2017 with ARF, now w/ concern for UTI.  Pharmacy has been consulted for aztreonam dosing.  Plan: Aztreonam 500mg  IV Q8H.  Height: 5' 3.5" (161.3 cm) Weight: 158 lb 15.2 oz (72.1 kg) IBW/kg (Calculated) : 53.55  Temp (24hrs), Avg:97.5 F (36.4 C), Min:96.3 F (35.7 C), Max:98.6 F (37 C)   Recent Labs Lab 01/05/2017 1505 12/25/16 0705 12/26/16 0201 12/26/16 0259  WBC 5.7 6.5 7.2  --   CREATININE 2.20* 1.99* 2.41*  --   LATICACIDVEN  --   --  2.4* 1.82    Estimated Creatinine Clearance: 20.6 mL/min (by C-G formula based on SCr of 2.41 mg/dL (H)).    Allergies  Allergen Reactions  . Cephalexin Swelling and Rash  . Ciprofloxacin Swelling and Rash  . Zyvox [Linezolid] Other (See Comments)    Thrombocytopenia developed to 50k after several weeks of therapy  . Adhesive [Tape] Other (See Comments)    "old [employer] adhesive tape; takes my skin off; can use paper tape"  . Bactrim [Sulfamethoxazole-Trimethoprim] Other (See Comments)    Hyperkalemia (elevated potassium) and Increased creatinine  . Daptomycin Other (See Comments)    Had elevated CPK on therapy, not clear that this was due to cubicin  . Vancomycin Other (See Comments)    ? If this played role in her Acute kidney injury    Thank you for allowing pharmacy to be a part of this patient's care.  Wynona Neat, PharmD, BCPS  12/26/2016 3:46 AM

## 2016-12-26 NOTE — Procedures (Signed)
Central Venous Catheter Insertion Procedure Note [name] [id number] 27-Aug-1946  Procedure: Insertion of Central Venous Catheter Indications: Assessment of intravascular volume, Drug and/or fluid administration and Frequent blood sampling  Procedure Details Consent: Risks of procedure as well as the alternatives and risks of each were explained to the (patient/caregiver).  Consent for procedure obtained. Time Out: Verified patient identification, verified procedure, site/side was marked, verified correct patient position, special equipment/implants available, medications/allergies/relevent history reviewed, required imaging and test results available.  Performed  Maximum sterile technique was used including antiseptics, cap, gloves, gown, hand hygiene, mask and sheet. Skin prep: Chlorhexidine; local anesthetic administered A antimicrobial bonded/coated triple lumen catheter was placed in the right internal jugular vein using the Seldinger technique.  Evaluation Blood flow good Complications: No apparent complications Patient did tolerate procedure well. Chest X-ray ordered to verify placement.  CXR: pending.  Procedure performed under direct ultrasound guidance for real time vessel cannulation.      Montey Hora, [facility] Pager: [phone]  or [phone] 12/26/2016, 6:33 AM

## 2016-12-26 NOTE — Progress Notes (Signed)
Occupational Therapy Treatment Patient Details Name: [name] MRN: [id number] DOB: 11/21/1945 Today's Date: 12/26/2016    History of present illness Pt is a 71 y.o. female who presented to the ED with edema, concerns of decreased urine output, and creatinine elevation. Additionally she reports a "flulike reaction" following influenza vaccination a few days prior to this. She had developed wounds on B knees. She presented with shortness of breath and weakness. Pt transferred to SDU 12/25/12.  PMH significant for: anemia, anginal pain, aortic stenosis, arthritis, B12 deficiency anemia, B carpal tunnel syndrome, blood transfusion, breast cancer, CAD, CHF, cellulitis, claudication, exertional dyspnea, GERD, heart murmur, HTN, hyperlipidemia, hypothyroidism, iron deficiency anemia, myocardial infarction, osteomyelitis L foot, PVD, scleroderma, seizure, stomach ulcer, type 1 DM.   OT comments  Pt with poor activity tolerance, B knee pain and anxiety this visit. Used stedy to assist with sit to stand. Pt requiring extensive assistance for UB bathing and dressing with multiple lines contributing. Updated d/c recommendation to SNF.  Follow Up Recommendations  SNF;Supervision/Assistance - 24 hour    Equipment Recommendations  None recommended by OT    Recommendations for Other Services      Precautions / Restrictions Precautions Precautions: Fall Restrictions Weight Bearing Restrictions: No       Mobility Bed Mobility               General bed mobility comments: Received in chair.  Transfers Overall transfer level: Needs assistance Equipment used:  (stedy) Transfers: Sit to/from Stand Sit to Stand: +2 physical assistance;Mod assist         General transfer comment: assist of pad under hips and gait belt under axilla to stand x 1    Balance Overall balance assessment: Needs assistance   Sitting balance-Leahy Scale: Fair     Standing balance support: Bilateral  upper extremity supported Standing balance-Leahy Scale: Poor Standing balance comment: stood with stedy                   ADL Overall ADL's : Needs assistance/impaired         Upper Body Bathing: Maximal assistance;Sitting Upper Body Bathing Details (indicate cue type and reason): pt appeared to be reluctant to use UEs due to multiple lines         Lower Body Dressing: Total assistance;Sitting/lateral leans Lower Body Dressing Details (indicate cue type and reason): assist to rewrap R LE and to place slip on shoes                      Vision                     Perception     Praxis      Cognition   Behavior During Therapy: Anxious Overall Cognitive Status: Within Functional Limits for tasks assessed                  General Comments: pt reports claustraphobia, works well when involved in plan for session due to anxiety      Exercises     Shoulder Instructions       General Comments      Pertinent Vitals/ Pain       Pain Assessment: Faces Faces Pain Scale: Hurts whole lot Pain Location: knees, R thighs Pain Descriptors / Indicators: Grimacing;Guarding;Moaning Pain Intervention(s): Monitored during session;Repositioned  Home Living  Prior Functioning/Environment              Frequency  Min 1X/week        Progress Toward Goals  OT Goals(current goals can now be found in the care plan section)  Progress towards OT goals: Not progressing toward goals - comment  Acute Rehab OT Goals Patient Stated Goal: to go home soon and feel better Time For Goal Achievement: 01/08/17 Potential to Achieve Goals: Good  Plan Discharge plan needs to be updated    Co-evaluation    PT/OT/SLP Co-Evaluation/Treatment: Yes Reason for Co-Treatment: For patient/therapist safety   OT goals addressed during session: ADL's and self-care      End of Session Equipment Utilized  During Treatment: Gait belt;Oxygen  OT Visit Diagnosis: Unsteadiness on feet (R26.81);Other abnormalities of gait and mobility (R26.89);Muscle weakness (generalized) (M62.81);Pain Pain - Right/Left: Right Pain - part of body: Leg   Activity Tolerance Patient tolerated treatment well   Patient Left in chair;with call bell/phone within reach;with family/visitor present   Nurse Communication  (aware of rash under breast)        Time: 6754-4920 OT Time Calculation (min): 39 min  Charges: OT General Charges $OT Visit: 1 Procedure OT Treatments $Self Care/Home Management : 8-22 mins     Malka So 12/26/2016, 1:54 PM  [phone]

## 2016-12-26 NOTE — Procedures (Signed)
Arterial Catheter Insertion Procedure Note [name] [id number] [birth year]  Procedure: Insertion of Arterial Catheter  Indications: Blood pressure monitoring and Frequent blood sampling  Procedure Details Consent: Risks of procedure as well as the alternatives and risks of each were explained to the (patient/caregiver).  Consent for procedure obtained. Time Out: Verified patient identification, verified procedure, site/side was marked, verified correct patient position, special equipment/implants available, medications/allergies/relevent history reviewed, required imaging and test results available.  Performed  Maximum sterile technique was used including antiseptics, cap, gloves, gown, hand hygiene, mask and sheet. Skin prep: Chlorhexidine; local anesthetic administered 20 gauge catheter was inserted into right radial artery using the Seldinger technique.  Evaluation Blood flow good; BP tracing good. Complications: No apparent complications.  RN called for RT to place A line. 2 RT's to place Art line, with one attempt. BioPatch placed. Pt tolerated fairly well, RN aware of insertion. Easy draw back for blood and easy to flush with good tracing. RT to monitor as needed.      [name] 12/26/2016

## 2016-12-26 NOTE — Consult Note (Signed)
PULMONARY / CRITICAL CARE MEDICINE   Name: [name] MRN: [id number] DOB: 1945-12-08    ADMISSION DATE:  12/23/2016 CONSULTATION DATE:  12/26/16  REFERRING MD:  Maylene Roes  CHIEF COMPLAINT:  Hypotension  HISTORY OF PRESENT ILLNESS:  Pt is encephelopathic; therefore, this HPI is obtained from chart review. [name] is a 71 y.o. female with PMH as outlined below. She was admitted 01/05/2017 with decreased UOP.  She had called cardiology prior to coming to ED who had instructed her to increase her torsemide.  She was found to have AoCKD and hypovolemia.  She was seen by cardiology and nephrology 3/13.  Diuretics were held and she had improvement in renal function but cardiology felt she was developing volume overload.  On 3/13, she also developed new onset A.fib with RVR for which she was started on amiodarone and IV heparin.  Heart failure team was asked to see and were planning to see 12/26/16.  During early AM hours 3/14, she developed worsening hypotension.  Of note, she had received 12.22m carvedilol at 1747 and 326mImdur at 2211 the evening prior.  TRH evaluated pt at bedside and ordered 75046mS bolus with improvement in SBP to mid 80's.  She was also started on dobutamine.  PCCM was subsequently asked to see and transfer to ICU for further evaluation and management.  Of note, she had echo 3/13 which demonstrated EF 15%, mild AS and AR, mild MR, mod TR.  Previous echo Oct 2016 had EF 25-30% with G2DD.   PAST MEDICAL HISTORY :  She  has a past medical history of Anemia; Anginal pain (HCCBelknapAortic stenosis; Arthritis; B12 deficiency anemia; Bilateral carpal tunnel syndrome (1970s); Blood transfusion; Breast cancer (HCCUnion DepositCAD (coronary artery disease); Cat bite (2009); Cellulitis; Cellulitis (June 2013, Nov. 2013 and Feb. 14, 2014); CHF (congestive heart failure) (HCCLeonaAug. 2012); Claudication ([facility]CHendrick Medical CenterExertional dyspnea; GERD (gastroesophageal reflux disease); Heart murmur; History  of bronchitis; History of recurrent UTIs; HTN (hypertension); Hyperlipidemia; Hypothyroidism; Iron deficiency anemia (06/13/2012); Myocardial infarction (1986); Osteoarthritis of both feet; Osteomyelitis (HCCKimberlyPVD (peripheral vascular disease) (HCCEl PasoScleroderma (HCCDripping SpringsSeizure (HCCDravosburgStomach ulcer (1981?); and Type 1 diabetes (HCCRobinwood1/1951).  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  Allergies  Allergen Reactions  . Cephalexin Swelling and Rash  . Ciprofloxacin Swelling and Rash  . Zyvox [Linezolid] Other (See Comments)    Thrombocytopenia developed to 50k after several weeks of therapy  . Adhesive [Tape] Other (See Comments)    "old Johnson/Johnson adhesive tape; takes my skin off; can use paper tape"  . Bactrim [Sulfamethoxazole-Trimethoprim] Other (See Comments)    Hyperkalemia (elevated potassium) and Increased creatinine  . Daptomycin Other (See Comments)    Had elevated CPK on therapy, not clear that this was due to cubicin  . Vancomycin Other (See Comments)    ? If this  played role in her Acute  kidney injury    No current facility-administered medications on file prior to encounter.    Current Outpatient Prescriptions on File Prior to Encounter  Medication Sig  . aspirin EC 325 MG EC tablet Take 325 mg by mouth daily.   [name] atorvastatin (LIPITOR) 40 MG tablet TAKE 1 TABLET BY MOUTH AT BEDTIME  . Cyanocobalamin 1000 MCG/ML KIT Inject 1,000 mcg into the skin every 8 (eight) weeks.   . docusate sodium (COLACE) 50 MG capsule Take 50 mg by mouth daily.  . hydrOXYzine (ATARAX/VISTARIL) 10 MG tablet Take 1 tablet (10 mg total) by mouth 3 (three) times daily as needed for itching or anxiety.  . insulin detemir (LEVEMIR) 100 UNIT/ML injection Inject 12 Units into the skin 2 (two) times daily. MORNING AND BEDTIME  . insulin lispro (HUMALOG) 100 UNIT/ML injection Inject 1-12 Units into the skin 4 (four) times daily as needed for high blood sugar (sliding scale).   . isosorbide mononitrate (IMDUR) 30 MG 24 hr tablet Take 1 tablet (30 mg total) by mouth daily.  [name] levothyroxine (SYNTHROID, LEVOTHROID) 100 MCG tablet Take 100 mcg by mouth daily before breakfast.   . torsemide (DEMADEX) 20 MG tablet Take 2 tablets (40 mg total) by mouth every morning.    FAMILY HISTORY:  Her indicated that her mother is deceased. She indicated that her father is deceased. She indicated that her brother is alive. She indicated that her maternal grandmother is deceased. She indicated that her maternal grandfather is deceased. She indicated that her paternal grandmother is deceased. She indicated that her paternal grandfather is deceased. She indicated that the status of her neg hx is unknown.    SOCIAL HISTORY: She  reports that she has never smoked. She has never used smokeless tobacco. She reports that she does not drink alcohol or use drugs.  REVIEW OF SYSTEMS: All negative; except for those that are bolded, which indicate positives.  Constitutional: weight loss, weight gain, night sweats, fevers, chills,  fatigue, weakness.  HEENT: headaches, sore throat, sneezing, nasal congestion, post nasal drip, difficulty swallowing, tooth/dental problems, visual complaints, visual changes, ear aches. Neuro: difficulty with speech, weakness, numbness, ataxia. CV:  chest pain, orthopnea, PND, swelling in lower extremities, dizziness, palpitations, syncope.  Resp: cough, hemoptysis, dyspnea, wheezing. GI: heartburn, indigestion, abdominal pain, nausea, vomiting, diarrhea, constipation, change in bowel habits, loss of appetite, hematemesis, melena, hematochezia.  GU: dysuria, change in color of urine, urgency or frequency, flank pain, hematuria. MSK: joint pain or swelling, decreased range of motion. Psych: change in mood or affect, depression, anxiety, suicidal ideations, homicidal ideations. Skin: rash, itching, bruising.   SUBJECTIVE:  "I feel weak".  Denies chest pain.  SOB improved from earlier.  VITAL SIGNS: BP (!) 76/66   Pulse 85   Temp 97.9 F (36.6 C) (Rectal)   Resp 15   Ht 5' 3.5" (1.613 m)   Wt 72.1 kg (158 lb 15.2 oz)   SpO2 (!) 79%   BMI 27.72 kg/m   HEMODYNAMICS:    VENTILATOR SETTINGS:    INTAKE / OUTPUT: I/O last 3 completed shifts: In: 840.2 [I.V.:740.2; IV Piggyback:100] Out: -    PHYSICAL EXAMINATION: General: Chronically ill appearing female, in NAD. Neuro: Sleepy but easily arouseable, no focal deficits. HEENT: [place]/AT. PERRL, sclerae anicteric. Cardiovascular: RRR, no M/R/G.  Lungs: Respirations even and unlabored.  CTA bilaterally, No W/R/R. Abdomen: BS x 4, soft, NT/ND.  Musculoskeletal: Left transmetatarsal amputation, no edema.  Skin: B/l feet  and knees in dressings (chronic wounds), cool, no rashes.  LABS:  BMET  Recent Labs Lab 12/27/2016 1505 12/25/16 0705 12/26/16 0201  NA 132* 133* 132*  K 4.4 3.9 4.7  CL 97* 99* 98*  CO2 '23 22 23  ' BUN 80* 76* 75*  CREATININE 2.20* 1.99* 2.41*  GLUCOSE 369* 134*  136* 61*    Electrolytes  Recent  Labs Lab 12/27/2016 1505 01/11/2017 2016 12/25/16 0705 12/26/16 0201  CALCIUM 9.1  --  8.9 8.9  MG  --  2.2 2.2  --   PHOS  --   --  4.3  --     CBC  Recent Labs Lab 01/07/2017 1505 12/25/16 0705 12/26/16 0201  WBC 5.7 6.5 7.2  HGB 14.6 14.2 15.4*  HCT 44.5 43.8 47.3*  PLT 152 147* 150    Coag's No results for input(s): APTT, INR in the last 168 hours.  Sepsis Markers  Recent Labs Lab 12/26/16 0201 12/26/16 0259  LATICACIDVEN 2.4* 1.82    ABG No results for input(s): PHART, PCO2ART, PO2ART in the last 168 hours.  Liver Enzymes  Recent Labs Lab 01/09/2017 2016 12/25/16 0705 12/26/16 0201  AST '24 23 30  ' ALT 15 13* 15  ALKPHOS 115 104 112  BILITOT 1.1 1.1 0.7  ALBUMIN 3.1* 2.9* 2.9*    Cardiac Enzymes  Recent Labs Lab 12/31/2016 2016 12/25/16 0156 12/25/16 0705  TROPONINI 0.06* 0.06* 0.06*    Glucose  Recent Labs Lab 12/25/16 1730 12/25/16 2209 12/25/16 2332 12/26/16 0009 12/26/16 0049 12/26/16 0258  GLUCAP 182* 134* 65 72 75 82    Imaging US Renal  Result Date: 12/25/2016 CLINICAL DATA:  71 year old diabetic hypertensive female with chronic kidney disease. Initial encounter. EXAM: RENAL / URINARY TRACT ULTRASOUND COMPLETE COMPARISON:  05/29/2013. FINDINGS: Right Kidney: Length: 10.5 cm. No hydronephrosis. Increased echogenicity. Lobular contour. 1.1 cm cyst lower pole region. Left Kidney: Length: 9.8 cm. No hydronephrosis. Increased echogenicity. Lobular contour. 0.7 cm cyst upper pole region. Bladder: Appears normal for degree of bladder distention. Bilateral pleural effusions greater on right.  Ascites. IMPRESSION: No hydronephrosis. Increased renal echogenicity bilaterally consistent with changes of medical renal disease. Small renal cysts. Bilateral pleural effusions greater on right. Ascites. Electronically Signed   By: Genia Del M.D.   On: 12/25/2016 06:45     STUDIES:  Echo 3/13 > EF 15%, mild AS and AR, mild MR, mod TR. V/Q 3/14 >    CULTURES: Blood 3/14 > Urine 3/12 > GNR >   ANTIBIOTICS: Aztreonam 3/12 > 3/13 Doxy 3/12 > 3/13   SIGNIFICANT EVENTS: 3/12 > admit. 3/14 > new hypotension > transfer to ICU > started on dobutamine.  LINES/TUBES: CVL pending 3/13 >  DISCUSSION: 71 y.o. female with hx CKD, combined heart failure, admitted 3/12 with oliguria and decompensated heart failure.  Developed hypotension early AM 3/14; therefore, started on dobutamine and transferred to ICU.  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 Hypotension - likely due to above + exacerbated after pt received antihypertensive meds (carvedilol and imdur the evening prior). New onset A.Fib RVR - started on amio and heparin 3/13. Hx PVD, HTN, HLD, CAD s/p CABG 1993, AS. P:  Continue dobutamine gtt. Hold heparin gtt for now as will need CVL placement. Assess CVP, co-ox. Heart failure team to see in AM. D/c carvedilol and imdur for now. Continue amio. Continue preadmission ASA, atorvastatin. Hold preadmission  torsemide.  PULMONARY A: Acute hypoxic respiratory failure. P:   Continue supplemental O2 as needed to maintain SpO2 > 92%. Pulmonary hygiene. Mobilize as able. V/Q scan pending.  RENAL A:   AoCKD - felt to be due to intravascular contraction from increased diuretics. Hyponatremia - due to above. P:   NS @ 50. Nephrology following. BMP in AM.  GASTROINTESTINAL A:   GERD. Nutrition. P:   NPO.  HEMATOLOGIC A: Hemoconcentration.   VTE Prophylaxis. P:  SCD's / heparin. CBC in AM.  INFECTIOUS A:   UTI - Urine cultures prelim with GNR's. Cellulitis of LE's - s/p treatment with doxy. P:   Abx as above (aztreonam - Cephalosporin and flouroquinolone allergy).  Follow cultures as above. Wound care following.  ENDOCRINE A:   DM. Hypothyroidism. P:   SSI. Continue preadmission synthroid, change to IV  formulation.  NEUROLOGIC A:   No acute issues. P:   No interventions required.  Family updated: None available.  Interdisciplinary Family Meeting v Palliative Care Meeting:  Due by: 01/01/17.  CC time: 40 min.   Montey Hora, [place] [facility] Pager: [phone]  or [phone] 12/26/2016, 3:23 AM

## 2016-12-26 NOTE — Progress Notes (Signed)
Hypoglycemic Event  CBG: 67  Treatment: D50 IV 25 mL  Symptoms: None  Follow-up CBG: Time: 0639 CBG Result: 135  Possible Reasons for Event: Inadequate meal intake  Comments/MD notified:Alva, MD. No new orders given at this time.     [place]

## 2016-12-26 NOTE — Progress Notes (Signed)
ANTICOAGULATION CONSULT NOTE - Follow Up Consult  Pharmacy Consult for Heparin Indication: atrial fibrillation  Allergies  Allergen Reactions  . Cephalexin Swelling and Rash  . Ciprofloxacin Swelling and Rash  . Zyvox [Linezolid] Other (See Comments)    Thrombocytopenia developed to 50k after several weeks of therapy  . Adhesive [Tape] Other (See Comments)    "old Johnson/Johnson adhesive tape; takes my skin off; can use paper tape"  . Bactrim [Sulfamethoxazole-Trimethoprim] Other (See Comments)    Hyperkalemia (elevated potassium) and Increased creatinine  . Daptomycin Other (See Comments)    Had elevated CPK on therapy, not clear that this was due to cubicin  . Vancomycin Other (See Comments)    ? If this played role in her Acute kidney injury    Patient Measurements: Height: 5\' 3"  (160 cm) Weight: 175 lb 11.3 oz (79.7 kg) IBW/kg (Calculated) : 52.4 Heparin Dosing Weight: ~70kg  Vital Signs: Temp: 94.4 F (34.7 C) (03/14 1028) Temp Source: Oral (03/14 1028) BP: 106/63 (03/14 0844) Pulse Rate: 90 (03/14 0945)  Labs:  Recent Labs  12/31/2016 1505 01/04/2017 2016 12/25/16 0156 12/25/16 0705 12/25/16 2217 12/26/16 0201  HGB 14.6  --   --  14.2  --  15.4*  HCT 44.5  --   --  43.8  --  47.3*  PLT 152  --   --  147*  --  150  HEPARINUNFRC  --   --   --   --  0.51 0.64  CREATININE 2.20*  --   --  1.99*  --  2.41*  TROPONINI  --  0.06* 0.06* 0.06*  --   --     Estimated Creatinine Clearance: 21.4 mL/min (by C-G formula based on SCr of 2.41 mg/dL (H)).  Assessment: 70yof started on heparin this morning for new onset afib. Heparin level therapeutic at 0.64. Heparin turned off temporarily for central line placement, now resumed. CBC stable. No bleeding.  Goal of Therapy:  Heparin level 0.3-0.7 units/ml Monitor platelets by anticoagulation protocol: Yes   Plan:  1) Continue heparin at 1000 units/hr 2) Daily heparin level and CBC  Deboraha Sprang 12/26/2016,1:16  PM

## 2016-12-26 NOTE — Progress Notes (Signed)
  Afternoon coox 60%. Continue current support.   Poor urine output noted. Urged to use the restroom.  Missed the collection bowl in the toilet so unclear total. Estimate 100-150 cc, but definitely no more than 200 cc.    Pt adamantly refusing foley at this time.   Will continue to follow closely.  Will encourage foley if output remains low. MD aware.    Legrand Como [address] [place], PA-C 12/26/2016 3:00 PM

## 2016-12-26 NOTE — Significant Event (Signed)
Patient insisting on sitting on side of bed. Patient stated she is anxious and claustrophobic in bed. Temperature was 93 orally, patient refuses rectal temperature. Warm blankets have been placed on patient since on shift numerous times. No changed in temperature. Temperature have been turned to max in the room. Patient refuses to return to bed, for RN to place a bair hugger on. Concerned with leaving patient sitting on side of bed alone. Patient assisted to chair. Bair hugger placed on patient.     Allani Reber

## 2016-12-26 NOTE — Progress Notes (Addendum)
Shift event: RN paged earlier because pt's BP was 58/29. Pt easily aroused, slightly anxious, but oriented. Cardiology was at bedside secondary to RN letting them know as well since they are following pt for CHF, CAD, PVD.  Brief hx: Pt admitted 01/01/2017 with CC: decreased urine output. Found to be in acute on chronic renal failure. Lasix held and gentle IVFs started. Nephro following for AKI/CKD and hyponatremia. Per nephro, pt with volume contraction. Pt with significant hx of CHF (new echo with EF 15% and severe AS). Also, extensive CAD s/p CABG with occlusive disease to distal LAD per last cardiac cath in Feb. Per cardiology notes, pt is not a candidate for redo CABG. Also, pt with AFib and was started on Amiodarone today by cardiology. She is on Heparin drip secondary to AF with ChadsVas score of 6.  NP to bedside after above call.  S: pt feels weak, is concerned and nervous about everything that is going on. Upset because her BP is low and her heart has worsened. Wants someone to talk with her brother on rounds. Says her family doesn't know "how serious this is". She has no family in town that we can call now. No chest pain. + mild abd discomfort. "tight". Per RN, apparently, no UO 3/13. None on this shift.  O: Pt appears acute on chronically ill. She rests with eyes closed but is easily aroused and oriented. VS-T 97.9 R, BP 58-60. HR 80s. RR 18. O2 sat normal on 2L. Card: RRR. No murmur. Lungs: CTA, good exchange. Abd: taunt, BS hypoactive. Extremities: amputation to ankle LLE. Distal extremities are cool. No cyanosis or mottling. Skin: dry, pale. Oral mucosa very dry, pale. Neuro: no focal deficits noted.  A/P: 1. Hypotension-? Shock. placed Aline for accuracy and BP up to 70s. S/p 3, 250cc boluses over past 2.5 hrs. She appears dry on exam. NP spoke with PCCM. Recommend Dobutamine and they will come see the pt. TF pt to ICU status. Expect PCCM to assume care. NP chatted with cardiologist while he was  here. Pt has a poor prognosis. LA returned at 2.4.  2. AKI on CKD III-nephro following. Awaiting CMP results. Renal US consistent with CKD.  3. Abdominal distention-KUB.  4. CAD-per cards. Not a candidate for redo surgery.  5. AF-back in SR now, stopped Amio IV due to BP. HR holding at present.  6. PVD-stable. Chronic.  7. 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 Poor prognosis. Seemed surprised when NP spoke with her about the seriousness of disease. Recommend palliative care consult.  8. CODE status- pt endorsed no ETT, but OK with CPR/drugs, etc.  PCCM PA here, Rahul. Care turned over to them. Pt to transfer to ICU room.  KJKG, NP [facility]

## 2016-12-26 NOTE — Progress Notes (Signed)
ANTICOAGULATION CONSULT NOTE - Follow Up Consult  Pharmacy Consult for Heparin  Indication: atrial fibrillation  Allergies  Allergen Reactions  . Cephalexin Swelling and Rash  . Ciprofloxacin Swelling and Rash  . Zyvox [Linezolid] Other (See Comments)    Thrombocytopenia developed to 50k after several weeks of therapy  . Adhesive [Tape] Other (See Comments)    "old Johnson/Johnson adhesive tape; takes my skin off; can use paper tape"  . Bactrim [Sulfamethoxazole-Trimethoprim] Other (See Comments)    Hyperkalemia (elevated potassium) and Increased creatinine  . Daptomycin Other (See Comments)    Had elevated CPK on therapy, not clear that this was due to cubicin  . Vancomycin Other (See Comments)    ? If this played role in her Acute kidney injury   Vital Signs: Temp: 96.3 F (35.7 C) (03/13 2100) Temp Source: Axillary (03/13 2100) BP: 88/62 (03/13 2200) Pulse Rate: 107 (03/13 1215)  Labs:  Recent Labs  01/07/2017 1505 12/20/2016 2016 12/25/16 0156 12/25/16 0705 12/25/16 2217  HGB 14.6  --   --  14.2  --   HCT 44.5  --   --  43.8  --   PLT 152  --   --  147*  --   HEPARINUNFRC  --   --   --   --  0.51  CREATININE 2.20*  --   --  1.99*  --   TROPONINI  --  0.06* 0.06* 0.06*  --     CrCl cannot be calculated (Unknown ideal weight.).  Assessment: 71 y/o F on heparin for afib, initial heparin level is therapeutic at 0.51, also planning for VQ scan  Goal of Therapy:  Heparin level 0.3-0.7 units/ml Monitor platelets by anticoagulation protocol: Yes   Plan:  -Cont heparin 1000 units/hr -AM HL  Narda Bonds 12/26/2016,12:06 AM

## 2016-12-26 NOTE — Progress Notes (Signed)
Patient ID: [name], female   DOB: 1946/08/16, 71 y.o.   MRN: [id number]  [facility] Progress Note   Assessment/ Plan:   1. Acute kidney injury on chronic kidney disease stage III: Constellation of data suggestive of renal hypoperfusion likely from cardiorenal syndrome (initially suspected volume contraction because of urine sodium <10). Events from overnight noted with hypotension and transferred to the ICU-now on intravenous dobutamine for inotropic support along with furosemide for volume unloading. The patient reports that she would not want to do hemodialysis-unclear whether this is an implication for long-term or short-term management but will attempt to broach it at a later time. 2. Acute exacerbation of congestive heart failure: History of ischemic cardiomyopathy with EF of 15%. Ongoing support with dobutamine and intravenous furosemide. 3. Hyponatremia: Secondary to CHF exacerbation and acute on chronic renal insufficiency-monitor with inotropic support/diuretic. 4. New onset atrial fibrillation/flutter: Transiently on amiodarone which was stopped because of hypertension, remains rate controlled  Subjective:   Events from overnight noted-transfer to ICU with hypotension/changes in mental status    Objective:   BP 106/63   Pulse 90   Temp (!) 94.4 F (34.7 C) (Oral)   Resp (!) 24   Ht 5\' 3"  (1.6 m)   Wt 79.7 kg (175 lb 11.3 oz)   SpO2 100%   BMI 31.13 kg/m   Intake/Output Summary (Last 24 hours) at 12/26/16 1134 Last data filed at 12/26/16 0700  Gross per 24 hour  Intake          1405.17 ml  Output              400 ml  Net          1005.17 ml   Weight change:   Physical Exam: NWG:[id number] sitting up in recliner, brother at bedside CVS: Pulse irregularly irregular, 3/6 holosystolic murmur, elevated JVD Resp: Fine rales midlung fields with diminished breath sounds over bases Abd: Soft, obese, nontender Ext: 2+ lower extremity edema with Ace  wrap over her extremities   Imaging: Dg Chest 2 View  Result Date: 12/28/2016 CLINICAL DATA:  Patient with history of pulmonary edema. EXAM: CHEST  2 VIEW COMPARISON:  Chest radiograph 07/29/2015. FINDINGS: Stable enlarged cardiac and mediastinal contours status post median sternotomy and CABG procedure. Re- demonstrated fractured sternotomy wires. Unchanged blunting of the right costophrenic angle with minimal heterogeneous opacities right lung base. Left lung is clear. Thoracic spine degenerative changes. IMPRESSION: Findings suggestive of moderate right pleural effusion with underlying pulmonary consolidation. Aortic atherosclerosis. Electronically Signed   By: Lovey Newcomer M.D.   On: 12/21/2016 17:52   US Renal  Result Date: 12/25/2016 CLINICAL DATA:  71 year old diabetic hypertensive female with chronic kidney disease. Initial encounter. EXAM: RENAL / URINARY TRACT ULTRASOUND COMPLETE COMPARISON:  05/29/2013. FINDINGS: Right Kidney: Length: 10.5 cm. No hydronephrosis. Increased echogenicity. Lobular contour. 1.1 cm cyst lower pole region. Left Kidney: Length: 9.8 cm. No hydronephrosis. Increased echogenicity. Lobular contour. 0.7 cm cyst upper pole region. Bladder: Appears normal for degree of bladder distention. Bilateral pleural effusions greater on right.  Ascites. IMPRESSION: No hydronephrosis. Increased renal echogenicity bilaterally consistent with changes of medical renal disease. Small renal cysts. Bilateral pleural effusions greater on right. Ascites. Electronically Signed   By: Genia Del M.D.   On: 12/25/2016 06:45   Dg Chest Port 1 View  Result Date: 12/26/2016 CLINICAL DATA:  Central line placement . EXAM: PORTABLE CHEST 1 VIEW COMPARISON:  01/03/2017.  07/29/2015. FINDINGS: Interim placement right IJ  line, its tips is at the cavoatrial junction. Prior CABG. Cardiomegaly. Diffuse right lung infiltrate and moderate right pleural effusion. Small left pleural effusion. No pneumothorax.  IMPRESSION: 1. Right IJ line noted with tip at the cavoatrial junction . No pneumothorax. 2.  Prior CABG.  Stable cardiomegaly. 3. Diffuse right lung infiltrate and/or edema with moderate right pleural effusion noted on today's exam. Small left pleural effusion. Electronically Signed   By: Marcello Moores  Register   On: 12/26/2016 07:25   Dg Abd Portable 1v  Result Date: 12/26/2016 CLINICAL DATA:  Abdominal distention tonight. EXAM: PORTABLE ABDOMEN - 1 VIEW COMPARISON:  CT abdomen and pelvis 04/01/2006 FINDINGS: The entire abdomen is not included within the field of view. Visualized abdominal contents demonstrate scattered gas and stool in the colon with scattered gas-filled small bowel. No small or large bowel dilatation. Somewhat hazy appearance of the abdomen may indicate ascites. No radiopaque stones. Vascular calcifications. Probable right pleural effusion with basilar atelectasis. IMPRESSION: No evidence of bowel obstruction. Possible abdominal ascites. Small right pleural effusion with basilar atelectasis. Electronically Signed   By: Lucienne Capers M.D.   On: 12/26/2016 04:03    Labs: BMET  Recent Labs Lab 01/03/2017 1505 12/25/16 0705 12/26/16 0201  NA 132* 133* 132*  K 4.4 3.9 4.7  CL 97* 99* 98*  CO2 23 22 23   GLUCOSE 369* 134*  136* 61*  BUN 80* 76* 75*  CREATININE 2.20* 1.99* 2.41*  CALCIUM 9.1 8.9 8.9  PHOS  --  4.3 5.0*   CBC  Recent Labs Lab 01/02/2017 1505 12/25/16 0705 12/26/16 0201  WBC 5.7 6.5 7.2  NEUTROABS  --   --  5.5  HGB 14.6 14.2 15.4*  HCT 44.5 43.8 47.3*  MCV 92.5 92.0 92.9  PLT 152 147* 150   Medications:    . [START ON 12/27/2016] aspirin EC  81 mg Oral Daily  . atorvastatin  40 mg Oral QHS  . aztreonam  500 mg Intravenous Q8H  . Chlorhexidine Gluconate Cloth  6 each Topical Daily  . furosemide  80 mg Intravenous BID  . guaiFENesin  600 mg Oral BID  . insulin aspart  0-15 Units Subcutaneous Q4H  . levothyroxine  100 mcg Oral QAC breakfast  . mouth  rinse  15 mL Mouth Rinse BID  . senna-docusate  1 tablet Oral Daily  . sodium chloride flush  10-40 mL Intracatheter Q12H  . sodium chloride flush  3 mL Intravenous Q12H   Elmarie Shiley, MD 12/26/2016, 11:34 AM

## 2016-12-27 ENCOUNTER — Inpatient Hospital Stay (HOSPITAL_COMMUNITY): Payer: Medicare Other

## 2016-12-27 DIAGNOSIS — R579 Shock, unspecified: Secondary | ICD-10-CM

## 2016-12-27 LAB — RENAL FUNCTION PANEL
ALBUMIN: 2.6 g/dL — AB (ref 3.5–5.0)
Albumin: 2.7 g/dL — ABNORMAL LOW (ref 3.5–5.0)
Anion gap: 10 (ref 5–15)
Anion gap: 11 (ref 5–15)
BUN: 83 mg/dL — AB (ref 6–20)
BUN: 84 mg/dL — ABNORMAL HIGH (ref 6–20)
CHLORIDE: 96 mmol/L — AB (ref 101–111)
CO2: 20 mmol/L — ABNORMAL LOW (ref 22–32)
CO2: 21 mmol/L — AB (ref 22–32)
CREATININE: 2.77 mg/dL — AB (ref 0.44–1.00)
Calcium: 8.8 mg/dL — ABNORMAL LOW (ref 8.9–10.3)
Calcium: 8.7 mg/dL — ABNORMAL LOW (ref 8.9–10.3)
Chloride: 98 mmol/L — ABNORMAL LOW (ref 101–111)
Creatinine, Ser: 2.85 mg/dL — ABNORMAL HIGH (ref 0.44–1.00)
GFR calc Af Amer: 19 mL/min — ABNORMAL LOW (ref >60)
GFR calc Af Amer: 18 mL/min — ABNORMAL LOW (ref >60)
GFR, EST NON AFRICAN AMERICAN: 16 mL/min — AB (ref >60)
GFR, EST NON AFRICAN AMERICAN: 16 mL/min — AB (ref >60)
GLUCOSE: 211 mg/dL — AB (ref 65–99)
Glucose, Bld: 257 mg/dL — ABNORMAL HIGH (ref 65–99)
PHOSPHORUS: 5.7 mg/dL — AB (ref 2.5–4.6)
POTASSIUM: 4.9 mmol/L (ref 3.5–5.1)
POTASSIUM: 4.6 mmol/L (ref 3.5–5.1)
Phosphorus: 5.6 mg/dL — ABNORMAL HIGH (ref 2.5–4.6)
Sodium: 128 mmol/L — ABNORMAL LOW (ref 135–145)
Sodium: 128 mmol/L — ABNORMAL LOW (ref 135–145)

## 2016-12-27 LAB — CBC
HEMATOCRIT: 41.1 % (ref 36.0–46.0)
Hemoglobin: 13.5 g/dL (ref 12.0–15.0)
MCH: 29.9 pg (ref 26.0–34.0)
MCHC: 32.8 g/dL (ref 30.0–36.0)
MCV: 91.1 fL (ref 78.0–100.0)
PLATELETS: 126 10*3/uL — AB (ref 150–400)
RBC: 4.51 MIL/uL (ref 3.87–5.11)
RDW: 16.2 % — AB (ref 11.5–15.5)
WBC: 6.1 10*3/uL (ref 4.0–10.5)

## 2016-12-27 LAB — COOXEMETRY PANEL
CARBOXYHEMOGLOBIN: 1.5 % (ref 0.5–1.5)
Carboxyhemoglobin: 1.5 % (ref 0.5–1.5)
METHEMOGLOBIN: 0.8 % (ref 0.0–1.5)
Methemoglobin: 0.8 % (ref 0.0–1.5)
O2 Saturation: 56.9 %
O2 Saturation: 53.1 %
TOTAL HEMOGLOBIN: 13.8 g/dL (ref 12.0–16.0)
Total hemoglobin: 14.2 g/dL (ref 12.0–16.0)

## 2016-12-27 LAB — GLUCOSE, CAPILLARY
GLUCOSE-CAPILLARY: 157 mg/dL — AB (ref 65–99)
GLUCOSE-CAPILLARY: 214 mg/dL — AB (ref 65–99)
Glucose-Capillary: 223 mg/dL — ABNORMAL HIGH (ref 65–99)
Glucose-Capillary: 214 mg/dL — ABNORMAL HIGH (ref 65–99)
Glucose-Capillary: 189 mg/dL — ABNORMAL HIGH (ref 65–99)
Glucose-Capillary: 217 mg/dL — ABNORMAL HIGH (ref 65–99)
Glucose-Capillary: 215 mg/dL — ABNORMAL HIGH (ref 65–99)

## 2016-12-27 LAB — URINALYSIS, ROUTINE W REFLEX MICROSCOPIC
BILIRUBIN URINE: NEGATIVE
GLUCOSE, UA: NEGATIVE mg/dL
KETONES UR: NEGATIVE mg/dL
LEUKOCYTES UA: NEGATIVE
NITRITE: NEGATIVE
PH: 5 (ref 5.0–8.0)
Protein, ur: NEGATIVE mg/dL
Specific Gravity, Urine: 1.012 (ref 1.005–1.030)

## 2016-12-27 LAB — HEPARIN LEVEL (UNFRACTIONATED): HEPARIN UNFRACTIONATED: 0.84 [IU]/mL — AB (ref 0.30–0.70)

## 2016-12-27 MED ORDER — GUAIFENESIN-DM 100-10 MG/5ML PO SYRP: 5.0000 mL | ORAL_SOLUTION | ORAL | Status: DC | PRN

## 2016-12-27 MED ORDER — MILRINONE LACTATE IN DEXTROSE 20-5 MG/100ML-% IV SOLN
0.3750 ug/kg/min | INTRAVENOUS | Status: DC
  Administered 2016-12-27 – 2017-01-02 (×14): 0.375 ug/kg/min via INTRAVENOUS
  Filled 2016-12-27 (×16): qty 100

## 2016-12-27 MED ORDER — NOREPINEPHRINE BITARTRATE 1 MG/ML IV SOLN
0.0000 ug/min | INTRAVENOUS | Status: DC
  Administered 2016-12-27 – 2016-12-29 (×3): 3 ug/min via INTRAVENOUS
  Filled 2016-12-27 (×4): qty 4

## 2016-12-27 MED ORDER — DEXTROSE 5 % IV SOLN
120.0000 mg | Freq: Two times a day (BID) | INTRAVENOUS | Status: DC
  Administered 2016-12-27 (×2): 120 mg via INTRAVENOUS
  Filled 2016-12-27 (×3): qty 12

## 2016-12-27 MED ORDER — FUROSEMIDE 10 MG/ML IJ SOLN
160.0000 mg | Freq: Once | INTRAVENOUS | Status: AC
  Administered 2016-12-27: 160 mg via INTRAVENOUS
  Filled 2016-12-27: qty 16

## 2016-12-27 MED ORDER — SALINE SPRAY 0.65 % NA SOLN
1.0000 | NASAL | Status: DC | PRN
  Filled 2016-12-27 (×2): qty 44

## 2016-12-27 MED ORDER — METOLAZONE 2.5 MG PO TABS
2.5000 mg | ORAL_TABLET | Freq: Once | ORAL | Status: AC
  Administered 2016-12-27: 2.5 mg via ORAL
  Filled 2016-12-27: qty 1

## 2016-12-27 MED ORDER — LEVOTHYROXINE SODIUM 75 MCG PO TABS
112.5000 ug | ORAL_TABLET | Freq: Every day | ORAL | Status: DC
  Administered 2016-12-27 – 2017-01-04 (×9): 112.5 ug via ORAL
  Filled 2016-12-27 (×6): qty 2
  Filled 2016-12-27: qty 5
  Filled 2016-12-27 (×2): qty 2

## 2016-12-27 NOTE — Progress Notes (Signed)
  Paged for bladder spasms.  Pt very uncomfortable.  No urine output since early this morning.  Bladder scans with variable results.   Discussed with Dr. Lorrene Reid - Nephrology.   Will place foley and re-send UA. Check UCx.     Once foley placed will give 160 mg IV lasix.   Ok to give 0.125 mg xanax with severe anxiety surrounding foley.   Repeat renal function panel. Suspect creatinine will continue to worsen.   Repeat coox now on levophed + milrinone.     All above discussed with MD.     Beryle Beams" [place], PA-C 12/27/2016 2:09 PM

## 2016-12-27 NOTE — Progress Notes (Signed)
Inpatient Diabetes Program Recommendations  AACE/ADA: New Consensus Statement on Inpatient Glycemic Control (2015)  Target Ranges:  Prepandial:   less than 140 mg/dL      Peak postprandial:   less than 180 mg/dL (1-2 hours)      Critically ill patients:  140 - 180 mg/dL   Lab Results  Component Value Date   GLUCAP 157 (H) 12/27/2016   HGBA1C 11.5 (H) 12/25/2016    Review of Glycemic ControlResults for [name], [name] (MRN [id number]) as of 12/27/2016 09:05  Ref. Range 12/26/2016 16:19 12/26/2016 23:38 12/27/2016 02:04 12/27/2016 05:01 12/27/2016 08:02  Glucose-Capillary Latest Ref Range: 65 - 99 mg/dL 199 (H) 214 (H) 223 (H) 214 (H) 157 (H)   Diabetes history: Type 1 diabetes for 67 years Inpatient Diabetes Program Recommendations:    Please consider adding AM dose of Levemir 5 units.   Thanks, Adah Perl, RN, BC-ADM Inpatient Diabetes Coordinator Pager [phone] (8a-5p)

## 2016-12-27 NOTE — Progress Notes (Signed)
PULMONARY / CRITICAL CARE MEDICINE   Name: [name] MRN: [id number] DOB: 14-Dec-1945    ADMISSION DATE:  12/27/2016 CONSULTATION DATE:  12/26/16  REFERRING MD:  Maylene Roes  CHIEF COMPLAINT:  Hypotension  HISTORY OF PRESENT ILLNESS:  Pt is encephelopathic; therefore, this HPI is obtained from chart review. [name] is a 71 y.o. female with PMH as outlined below. She was admitted 01/09/2017 with decreased UOP.  She had called cardiology prior to coming to ED who had instructed her to increase her torsemide.  She was found to have AoCKD and hypovolemia.  She was seen by cardiology and nephrology 3/13.  Diuretics were held and she had improvement in renal function but cardiology felt she was developing volume overload.  On 3/13, she also developed new onset A.fib with RVR for which she was started on amiodarone and IV heparin.  Heart failure team was asked to see and were planning to see 12/26/16.  During early AM hours 3/14, she developed worsening hypotension.  Of note, she had received 12.5mg  carvedilol at 1747 and 30mg  Imdur at 2211 the evening prior.  TRH evaluated pt at bedside and ordered 73ml NS bolus with improvement in SBP to mid 80's.  She was also started on dobutamine.  PCCM was subsequently asked to see and transfer to ICU for further evaluation and management.  Of note, she had echo 3/13 which demonstrated EF 15%, mild AS and AR, mild MR, mod TR.  Previous echo Oct 2016 had EF 25-30% with G2DD.   SUBJECTIVE:  Up to bedside, working on her computer.  Note change to milrinone + norepi this am   VITAL SIGNS: BP (!) 88/47 (BP Location: Left Leg)   Pulse 93   Temp 97.6 F (36.4 C) (Oral)   Resp 13   Ht 5\' 3"  (1.6 m)   Wt 79.7 kg (175 lb 11.3 oz)   SpO2 95%   BMI 31.13 kg/m   HEMODYNAMICS: CVP:  [11 mmHg-25 mmHg] 22 mmHg  VENTILATOR SETTINGS:    INTAKE / OUTPUT: I/O last 3 completed shifts: In: 2422.5 [P.O.:240; I.V.:2082.5; IV Piggyback:100] Out: 600  [Urine:600]   PHYSICAL EXAMINATION: General: Chronically ill appearing female, in NAD. Neuro: Sleepy but easily arouseable, no focal deficits. HEENT: /AT. PERRL, sclerae anicteric. Cardiovascular: RRR, no M/R/G.  Lungs: Respirations even and unlabored.  CTA bilaterally, No W/R/R. Abdomen: BS x 4, soft, NT/ND.  Musculoskeletal: Left transmetatarsal amputation, no edema.  Skin: B/l feet and knees in dressings (chronic wounds), cool, no rashes.  LABS:  BMET  Recent Labs Lab 12/26/16 0201 12/26/16 1538 12/27/16 0500  NA 132* 131* 128*  K 4.7 5.4* 4.9  CL 98* 101 98*  CO2 23 18* 20*  BUN 75* 78* 83*  CREATININE 2.41* 2.56* 2.77*  GLUCOSE 61* 193* 211*    Electrolytes  Recent Labs Lab 12/31/2016 2016  12/25/16 0705 12/26/16 0201 12/26/16 1538 12/27/16 0500  CALCIUM  --   --  8.9 8.9 8.7* 8.8*  MG 2.2  --  2.2 2.5*  --   --   PHOS  --   < > 4.3 5.0* 5.3* 5.7*  < > = values in this interval not displayed.  CBC  Recent Labs Lab 12/25/16 0705 12/26/16 0201 12/27/16 0500  WBC 6.5 7.2 6.1  HGB 14.2 15.4* 13.5  HCT 43.8 47.3* 41.1  PLT 147* 150 126*    Coag's No results for input(s): APTT, INR in the last 168 hours.  Sepsis Markers  Recent  Labs Lab 12/26/16 0201 12/26/16 0259  LATICACIDVEN 2.4* 1.82    ABG No results for input(s): PHART, PCO2ART, PO2ART in the last 168 hours.  Liver Enzymes  Recent Labs Lab 12/16/2016 2016 12/25/16 0705 12/26/16 0201 12/26/16 1538 12/27/16 0500  AST 24 23 30   --   --   ALT 15 13* 15  --   --   ALKPHOS 115 104 112  --   --   BILITOT 1.1 1.1 0.7  --   --   ALBUMIN 3.1* 2.9* 2.9* 2.5* 2.6*    Cardiac Enzymes  Recent Labs Lab 01/01/2017 2016 12/25/16 0156 12/25/16 0705  TROPONINI 0.06* 0.06* 0.06*    Glucose  Recent Labs Lab 12/26/16 1619 12/26/16 2338 12/27/16 0204 12/27/16 0501 12/27/16 0802 12/27/16 1151  GLUCAP 199* 214* 223* 214* 157* 189*    Imaging Dg Chest Port 1 View  Result Date:  12/27/2016 CLINICAL DATA:  Cough. EXAM: PORTABLE CHEST 1 VIEW COMPARISON:  12/26/2016. FINDINGS: Right IJ line noted in stable position. Prior CABG. Stable cardiomegaly. Interim improvement of diffuse right lung infiltrate and/or edema. Stable pleural effusions. No pneumothorax. IMPRESSION: 1. Right IJ line stable position. 2. Prior CABG. Stable cardiomegaly. Interim partial clearing of diffuse right lung infiltrate/edema. Stable bilateral pleural effusions . Electronically Signed   By: Marcello Moores  Register   On: 12/27/2016 07:28     STUDIES:  Echo 3/13 > EF 15%, mild AS and AR, mild MR, mod TR. V/Q 3/14 > low prob  CULTURES: Blood 3/14 > Urine 3/12 > E coli > R to unasyn, S otherwise; ESBL negative  ANTIBIOTICS: Aztreonam 3/12 > 3/13; 3/14 >>  Doxy 3/12 > 3/13   SIGNIFICANT EVENTS: 3/12 > admit. 3/14 > new hypotension > transfer to ICU > started on dobutamine.  LINES/TUBES: CVL 3/14 >  DISCUSSION: 71 y.o. female with hx CKD, combined heart failure, admitted 3/12 with oliguria and decompensated heart failure.  Developed hypotension early AM 3/14. E coli UTI.   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 Hypotension - likely due to above + BP meds + possible sepsis due to UTI New onset A.Fib RVR - started on amio and heparin 3/13. Hx PVD, HTN, HLD, CAD s/p CABG 1993, AS. P:  - dobuta changed to milrinone - norepi added  - follow SvO2 and CVP - heparin on hold, ? Timing to restart  - carvedilol on hold - continue amiodarone - ASA, lipitor - diuretics as per cardiology and renal as below  PULMONARY A: Acute hypoxic respiratory failure, likely due to pulm edema P:   - pulmonary hygiene,  - continue rx for her CHF  RENAL A:   AoCKD, progressive, consistent with ATN and cardiorenal syndrome Hyponatremia - due to above. P:   - D/c maintenance IVF - high dose lasix and metolazone  ordered for 3/15 - nephrology following - follow UOP and BMP  GASTROINTESTINAL A:   GERD. Nutrition. P:   - Heart healthy / carb modified diet  HEMATOLOGIC A: Hemoconcentration.   VTE Prophylaxis. P:  - SCD in place - follow CBC  INFECTIOUS A:   UTI - E coli R to PCN's (no ESBL) Cellulitis of LE's - s/p treatment with doxy. P:   - aztreonam should be adequate coverage in absence ESBL, continue  - wound care following   ENDOCRINE A:   DM. Hypothyroidism. P:   - SSI, levamir -  synthroid to PO - follow T3, T4  NEUROLOGIC A:   No acute issues. P:   - follow  Family updated: None at bedside 3/15  Interdisciplinary Family Meeting v Palliative Care Meeting:  Due by: 01/01/17.  Independent CC time 35 minutes  Baltazar Apo, MD, PhD 12/27/2016, 12:18 PM [facility] [phone] or if no answer [phone]

## 2016-12-27 NOTE — Progress Notes (Signed)
Advanced Heart Failure Rounding Note  Referring Physician: Dr Elsworth Soho Primary Physician:Dr. Carlota Raspberry Primary Cardiologist:  Dr. Harrington Challenger  Reason for Consultation: Cardiogenic shock   Subjective:    Developed shock overnight 12/26/16. Central line and Art line placed. Started on dobutamine. AHF team consulted. Echo significant for EF 30% down to 15%.  Coox 56.9% this am on Dobutamine 5 mcg/kg/min.   CVP 16-17. Poor urine output. Weak.  Having nose bleeds so heparin cut back. Refusing foley.  Sat on edge of bed to urinate but couldn't go because of her "shy bladder".  Art line pressures primarily in 100-110s.  ECG from yesterday personally reviewed, atrial fibrillation.  Looks like atypical flutter on telemetry this morning.   Only ~ 300-400 cc of UO yesterday (once used toilet without collection).  Creatinine 2.4 -> 2.5 -> 2.7. Has continually refused foley. BUN > 80.  Objective:   Weight Range: 175 lb 11.3 oz (79.7 kg) Body mass index is 31.13 kg/m.   Vital Signs:   Temp:  [93.3 F (34.1 C)-97.6 F (36.4 C)] 96.4 F (35.8 C) (03/15 0400) Pulse Rate:  [80-93] 90 (03/15 0700) Resp:  [8-30] 17 (03/15 0700) BP: (88-107)/(47-63) 88/47 (03/14 1614) SpO2:  [91 %-100 %] 97 % (03/15 0700) Arterial Line BP: (90-119)/(49-64) 100/58 (03/15 0700) Last BM Date: 12/26/16  Weight change: Filed Weights   12/26/16 0247 12/26/16 0430  Weight: 158 lb 15.2 oz (72.1 kg) 175 lb 11.3 oz (79.7 kg)    Intake/Output:   Intake/Output Summary (Last 24 hours) at 12/27/16 0738 Last data filed at 12/27/16 0700  Gross per 24 hour  Intake          1067.53 ml  Output              200 ml  Net           867.53 ml     Physical Exam: General:  Elderly appearing. NAD at rest. Remains anxious.   HEENT: Normal. Dry blood around bilateral nares.  Neck: Thick. JVP difficult but appears at least 14-16 cm. Carotids 2+ bilat; no bruits. No lymphadenopathy or thyromegaly appreciated. Cor: PMI nondisplaced.  Irregular. Rate normal. No S3/S4. 1/6 SEM RUSB.  Lungs: Decreased throughout.  Abdomen: Obese, distended, edematous. 1+ pitting. No hepatosplenomegaly. No bruits or masses. +BS Extremities: no cyanosis, clubbing, rash. 2+ edema into flanks. S/p L foot transmetatarsal amputation.  Neuro: alert & orientedx3, cranial nerves grossly intact. moves all 4 extremities w/o difficulty. Affect flat but appropriate.  Telemetry: Personally reviewed, A-flutter 80-90s  Labs: CBC  Recent Labs  12/26/16 0201 12/27/16 0500  WBC 7.2 6.1  NEUTROABS 5.5  --   HGB 15.4* 13.5  HCT 47.3* 41.1  MCV 92.9 91.1  PLT 150 481*   Basic Metabolic Panel  Recent Labs  12/25/16 0705 12/26/16 0201 12/26/16 1538 12/27/16 0500  NA 133* 132* 131* 128*  K 3.9 4.7 5.4* 4.9  CL 99* 98* 101 98*  CO2 22 23 18* 20*  GLUCOSE 134*  136* 61* 193* 211*  BUN 76* 75* 78* 83*  CREATININE 1.99* 2.41* 2.56* 2.77*  CALCIUM 8.9 8.9 8.7* 8.8*  MG 2.2 2.5*  --   --   PHOS 4.3 5.0* 5.3* 5.7*   Liver Function Tests  Recent Labs  12/25/16 0705 12/26/16 0201 12/26/16 1538 12/27/16 0500  AST 23 30  --   --   ALT 13* 15  --   --   ALKPHOS 104 112  --   --  BILITOT 1.1 0.7  --   --   PROT 6.5 6.8  --   --   ALBUMIN 2.9* 2.9* 2.5* 2.6*   No results for input(s): LIPASE, AMYLASE in the last 72 hours. Cardiac Enzymes  Recent Labs  01/08/2017 2016 12/25/16 0156 12/25/16 0705  TROPONINI 0.06* 0.06* 0.06*    BNP: BNP (last 3 results)  Recent Labs  10/12/16 1026 12/10/16 1431 12/18/2016 2016  BNP 1,200.9* 1,134.8* 706.0*    ProBNP (last 3 results)  Recent Labs  12/10/16 1431  PROBNP 21,500*     D-Dimer  Recent Labs  12/18/2016 2016  DDIMER 2.20*   Hemoglobin A1C  Recent Labs  12/25/16 0157  HGBA1C 11.5*   Fasting Lipid Panel No results for input(s): CHOL, HDL, LDLCALC, TRIG, CHOLHDL, LDLDIRECT in the last 72 hours. Thyroid Function Tests  Recent Labs  12/25/16 0157  TSH 17.998*     Other results:     Imaging/Studies:  Dg Chest Port 1 View  Result Date: 12/27/2016 CLINICAL DATA:  Cough. EXAM: PORTABLE CHEST 1 VIEW COMPARISON:  12/26/2016. FINDINGS: Right IJ line noted in stable position. Prior CABG. Stable cardiomegaly. Interim improvement of diffuse right lung infiltrate and/or edema. Stable pleural effusions. No pneumothorax. IMPRESSION: 1. Right IJ line stable position. 2. Prior CABG. Stable cardiomegaly. Interim partial clearing of diffuse right lung infiltrate/edema. Stable bilateral pleural effusions . Electronically Signed   By: Marcello Moores  Register   On: 12/27/2016 07:28       Medications:     Scheduled Medications: . aspirin EC  81 mg Oral Daily  . atorvastatin  40 mg Oral QHS  . aztreonam  500 mg Intravenous Q8H  . Chlorhexidine Gluconate Cloth  6 each Topical Daily  . Gerhardt's butt cream   Topical BID  . guaiFENesin  600 mg Oral BID  . insulin aspart  0-5 Units Subcutaneous Q4H  . insulin detemir  5 Units Subcutaneous QHS  . levothyroxine  100 mcg Oral QAC breakfast  . mouth rinse  15 mL Mouth Rinse BID  . senna-docusate  1 tablet Oral Daily  . sodium chloride flush  10-40 mL Intracatheter Q12H  . sodium chloride flush  3 mL Intravenous Q12H     Infusions: . amiodarone 30 mg/hr (12/26/16 2000)  . DOBUTamine 5 mcg/kg/min (12/26/16 1700)  . heparin 850 Units/hr (12/27/16 0545)     PRN Medications:  Place/Maintain arterial line **AND** sodium chloride, acetaminophen **OR** acetaminophen, ALPRAZolam, levalbuterol, ondansetron **OR** ondansetron (ZOFRAN) IV, sodium chloride flush   Assessment/Plan   [name] is a 71 y.o. female CAD s/p CABG, chronic combined CHF, moderate aortic stenosis, DM, HLD, PVD followed by Dr. Donnetta Hutching s/p L foot transmetatarsal amputation, hypothyroidism, syncope, CKD stage III-IV, and Iron/B12 deficiency.  Admitted for edema and poor urine output.  Found to be hypotensive and in cardiogenic shock. Also  found to be in [place] Afib. ? Infection origin/early + UA, UCx with E. Coli and CXR concerning for PNA.  1. Acute on chronic biventricular CHF: Ischemic cardiomyopathy.  Echo with admission with EF 15%, severely decreased RV systolic function, mid to moderate aortic stenosis.   - Remains markedly volume overloaded - Coox 56.9% this am on dobutamine 5 mcg/kg/min.  - Will switch to milrinone 0.375 mcg/kg/min and give lasix 120 mg BID with metolazone 2.5 mg once. Follow pressures. If BP drops needs levophed. - Suspect cardiorenal syndrome vs ATN with hypotension overnight into 12/26/16.  - She does not have ICD. Will eventually need  consideration with long standing low EF. Unstable currently and with chronic leg wounds.  - Not good candidate for advanced therapies with RV failure and limited mobility. 2. Atrial fibrillation versus atypical flutter:  - Remains in Afib but rate improved on amio gtt.  - Continue amio gtt at least while on IV inotrope support.  - Plan eventual TEE/DCCV once stable.  - Will need anticoag once stable with CHA2DS2/VASc of 6. Consider DOAC once renal function stabilizes.   3. AKI on CKD stage 3: Baseline creatinine 1.4-1.6. - - Up to 2.7 this am.  - Suspect component of cardiorenal syndrome but also ATN with hypotension.  - Nephrology following.  4. CAD:  - No chest pain. Last cath in 12/12 with patent LIMA to LAD with occlusion of distal LAD; High grade stenosis of nondominant RCA; Signif disease of prox LCx; SVG to L PDA occluded, SVG to OM patent) - Attempted PTCA of proximal LCx but failed 10/2011. No good  revascularization options.   - Continue ASA 81 and statin for now.  5. PAD: Extensive, especially in the left leg.  She is s/p left transmetatarsal amputation.  This has led to her limited mobility.  Followed by [facility].  - No change to current plan.  6. UTI: Treating with aztreonam. Per primary. No change.  7. Right pleural effusion:  - Noted on CXR. Stable this am. -  Continue to diurese. May need to consider thoracentesis.   8. Aortic stenosis: - Mild to possibly moderate on echo. No change.   Length of Stay: 3   Annamaria Helling  12/27/2016, 7:38 AM  Advanced Heart Failure Team Pager [phone] (M-F; 7a - 4p)  Please contact [facility] for night-coverage after hours (4p -7a ) and weekends on [email or web address]  Patient seen with PA, agree with the above note.  1. Acute on chronic systolic CHF: Ischemic cardiomyopathy.  Echo with admission with EF 15%, severely decreased RV systolic function, mid to moderate aortic stenosis.  Low output HF with co-ox 49.5% 3/14 on dobutamine 2.5 (dobutamine started overnight 3/13 with low BP). It is possible that the onset of atrial fibrillation/flutter led to decompensation of CHF.  Her volume overload actually appears fairly chronic in the legs. No chest pain.  Not very mobile, mostly in wheelchair.  She does not have an ICD. Dobutamine increased to 5 yesterday.  BP now stable.  Creatinine up to 2.77, suspect ATN from hypotensive episode 3/13 evening.  She remains volume overloaded with poor UOP yesterday, co-ox 57% with CVP 16-17.  - With stable BP, will stop dobutamine as co-ox still marginal and start milrinone 0.375.  Can use low dose norepinephrine if BP drops.  - Increase Lasix to 120 mg IV bid and give 1 dose metolazone 2.5.  - Does not have an ICD.  Has chronic wounds in legs so probably not good candidate right now.  Narrow QRS, so not candidate for CRT.  - She would be poor candidate for advanced therapies with very limited ambulation (mostly confined to wheelchair but does some walking for short distances).  2. Atrial fibrillation versus atypical flutter: This morning, appears to be in atypical atrial flutter. Afib/flutter with RVR may have led to decompensation of CHF.  She is on amiodarone gtt. - Continue amiodarone gtt at 30 mg/hr.   - Continue heparin gtt.  With labile renal function, will not start  DOAC yet.  Eventually, DOAC would be ideal agent unless renal function does not improve.  Once  she is diuresed and we have hopefully weaned down on inotrope, would attempt TEE-guided DCCV.  3. AKI on CKD stage 3: Baseline creatinine 1.4-1.6.  Up to 2.77 this morning.  Suspect ATN in setting of hypotensive episode on 3/13.  She is volume overloaded and diuresing poorly. Nephrology following.   - Diuretics and inotropes as above.  4. CAD: No chest pain.  Last cath in 12/12 as above.  Attempted PTCA of proximal LCx but failed.  Thought to have no good revascularization options.  Continue ASA 81 and statin for now.  5. PAD: Extensive, especially in the left leg.  She is s/p left transmetatarsal amputation.  This has led to her limited mobility. Followed by [facility].  6. UTI: Treating with aztreonam given allergies.  7. Right pleural effusion: Noted on CXR.  Will diurese as above, may need thoracentesis.  8. Aortic stenosis: Mild to possibly moderate on echo.   Loralie Champagne 12/27/2016 8:29 AM

## 2016-12-27 NOTE — Progress Notes (Signed)
Pharmacist Heart Failure Core Measure Documentation  Assessment: [name] has an EF documented as 15% on 12/25/16 by Echo.  Rationale: Heart failure patients with left ventricular systolic dysfunction (LVSD) and an EF < 40% should be prescribed an angiotensin converting enzyme inhibitor (ACEI) or angiotensin receptor blocker (ARB) at discharge unless a contraindication is documented in the medical record.  This patient is not currently on an ACEI or ARB for HF.  This note is being placed in the record in order to provide documentation that a contraindication to the use of these agents is present for this encounter.  ACE Inhibitor or Angiotensin Receptor Blocker is contraindicated (specify all that apply)  []   ACEI allergy AND ARB allergy []   Angioedema []   Moderate or severe aortic stenosis []   Hyperkalemia []   Hypotension []   Renal artery stenosis [x]   Worsening renal function, preexisting renal disease or dysfunction  Hildred Laser, Pharm D 12/27/2016 8:56 AM

## 2016-12-27 NOTE — Progress Notes (Signed)
ANTICOAGULATION CONSULT NOTE - Follow Up Consult  Pharmacy Consult for Heparin Indication: atrial fibrillation  Patient Measurements: Height: 5\' 3"  (160 cm) Weight: 175 lb 11.3 oz (79.7 kg) IBW/kg (Calculated) : 52.4  Vital Signs: Temp: 95.2 F (35.1 C) (03/15 0000) Temp Source: Oral (03/15 0000) Pulse Rate: 92 (03/15 0200)  Labs:  Recent Labs  12/23/2016 2016 12/25/16 0156 12/25/16 0705 12/25/16 2217 12/26/16 0201 12/26/16 1538 12/27/16 0500  HGB  --   --  14.2  --  15.4*  --  13.5  HCT  --   --  43.8  --  47.3*  --  41.1  PLT  --   --  147*  --  150  --  126*  HEPARINUNFRC  --   --   --  0.51 0.64  --  0.84*  CREATININE  --   --  1.99*  --  2.41* 2.56*  --   TROPONINI 0.06* 0.06* 0.06*  --   --   --   --     Estimated Creatinine Clearance: 20.1 mL/min (A) (by C-G formula based on SCr of 2.56 mg/dL (H)).  Assessment: Heparin for afib, elevated heparin level this AM, some nose bleeding overnight that resolves on its own.  Goal of Therapy:  Heparin level 0.3-0.7 units/ml Monitor platelets by anticoagulation protocol: Yes   Plan:  -Dec heparin to 850 units/hr -1300 HL -Monitor any further nose bleeding  Narda Bonds 12/27/2016,5:39 AM

## 2016-12-27 NOTE — Progress Notes (Signed)
Patient ID: [name], female   DOB: 1946/05/06, 71 y.o.   MRN: [id number]  [facility] Progress Note   Assessment/ Plan:   1. Acute kidney injury on chronic kidney disease stage III: Constellation of data suggestive of renal hypoperfusion likely from cardiorenal syndrome (initially suspected volume contraction because of urine sodium <10) and possibly now with ATN. Renal function worse overnight and unimpressive urine output overnight on furosemide/dobutamine and this morning started on milrinone in its place. I had a lengthy discussion with the patient regarding both short-term and long-term hemodialysis and their utilities as well as expressed my reservation of her ability to withstand long-term hemodialysis given her poor cardiac reserve/low EF and its limitations on the ability for successful ultrafiltration. She understands this and is willing to do what it takes to stay alive. No indications for RRT at this time yet, if remains refractory to ongoing medical measures, will discuss utility of short-term CRRT for volume unloading (unfortunately, high risk of long-term dialysis dependency). 2. Acute exacerbation of congestive heart failure: History of ischemic cardiomyopathy with EF of 15%. Ongoing support with dobutamine and intravenous furosemide. 3. Hyponatremia: Secondary to CHF exacerbation and acute on chronic renal insufficiency-monitor with inotropic support/diuretic. 4. New onset atrial fibrillation/flutter: Remains rate controlled and currently on amiodarone drip.  Subjective:   Reports to have had some epistaxis overnight with a sensation of dyspnea from postnasal drip/choking sensation reading to anxiety attack.    Objective:   BP (!) 88/47 (BP Location: Left Leg)   Pulse 90   Temp 97.6 F (36.4 C) (Oral)   Resp 17   Ht 5\' 3"  (1.6 m)   Wt 79.7 kg (175 lb 11.3 oz)   SpO2 97%   BMI 31.13 kg/m   Intake/Output Summary (Last 24 hours) at 12/27/16 9509 Last  data filed at 12/27/16 0700  Gross per 24 hour  Intake          1064.83 ml  Output              200 ml  Net           864.83 ml   Weight change:   Physical Exam: TOI:[id number] resting in bed, awaiting assistance to use BSC CVS: Pulse irregularly irregular, 3/6 holosystolic murmur, elevated JVD Resp: Fine rales midlung fields with diminished breath sounds over bases Abd: Soft, obese, nontender Ext: 2+ lower extremity edema with Ace wrap over her extremities   Imaging: Dg Chest Port 1 View  Result Date: 12/27/2016 CLINICAL DATA:  Cough. EXAM: PORTABLE CHEST 1 VIEW COMPARISON:  12/26/2016. FINDINGS: Right IJ line noted in stable position. Prior CABG. Stable cardiomegaly. Interim improvement of diffuse right lung infiltrate and/or edema. Stable pleural effusions. No pneumothorax. IMPRESSION: 1. Right IJ line stable position. 2. Prior CABG. Stable cardiomegaly. Interim partial clearing of diffuse right lung infiltrate/edema. Stable bilateral pleural effusions . Electronically Signed   By: Marcello Moores  Register   On: 12/27/2016 07:28   Dg Chest Port 1 View  Result Date: 12/26/2016 CLINICAL DATA:  Central line placement . EXAM: PORTABLE CHEST 1 VIEW COMPARISON:  12/19/2016.  07/29/2015. FINDINGS: Interim placement right IJ line, its tips is at the cavoatrial junction. Prior CABG. Cardiomegaly. Diffuse right lung infiltrate and moderate right pleural effusion. Small left pleural effusion. No pneumothorax. IMPRESSION: 1. Right IJ line noted with tip at the cavoatrial junction . No pneumothorax. 2.  Prior CABG.  Stable cardiomegaly. 3. Diffuse right lung infiltrate and/or edema with moderate right pleural effusion  noted on today's exam. Small left pleural effusion. Electronically Signed   By: Marcello Moores  Register   On: 12/26/2016 07:25   Dg Abd Portable 1v  Result Date: 12/26/2016 CLINICAL DATA:  Abdominal distention tonight. EXAM: PORTABLE ABDOMEN - 1 VIEW COMPARISON:  CT abdomen and pelvis 04/01/2006  FINDINGS: The entire abdomen is not included within the field of view. Visualized abdominal contents demonstrate scattered gas and stool in the colon with scattered gas-filled small bowel. No small or large bowel dilatation. Somewhat hazy appearance of the abdomen may indicate ascites. No radiopaque stones. Vascular calcifications. Probable right pleural effusion with basilar atelectasis. IMPRESSION: No evidence of bowel obstruction. Possible abdominal ascites. Small right pleural effusion with basilar atelectasis. Electronically Signed   By: Lucienne Capers M.D.   On: 12/26/2016 04:03    Labs: BMET  Recent Labs Lab 12/16/2016 1505 12/25/16 0705 12/26/16 0201 12/26/16 1538 12/27/16 0500  NA 132* 133* 132* 131* 128*  K 4.4 3.9 4.7 5.4* 4.9  CL 97* 99* 98* 101 98*  CO2 23 22 23  18* 20*  GLUCOSE 369* 134*  136* 61* 193* 211*  BUN 80* 76* 75* 78* 83*  CREATININE 2.20* 1.99* 2.41* 2.56* 2.77*  CALCIUM 9.1 8.9 8.9 8.7* 8.8*  PHOS  --  4.3 5.0* 5.3* 5.7*   CBC  Recent Labs Lab 12/30/2016 1505 12/25/16 0705 12/26/16 0201 12/27/16 0500  WBC 5.7 6.5 7.2 6.1  NEUTROABS  --   --  5.5  --   HGB 14.6 14.2 15.4* 13.5  HCT 44.5 43.8 47.3* 41.1  MCV 92.5 92.0 92.9 91.1  PLT 152 147* 150 126*   Medications:    . aspirin EC  81 mg Oral Daily  . atorvastatin  40 mg Oral QHS  . aztreonam  500 mg Intravenous Q8H  . Chlorhexidine Gluconate Cloth  6 each Topical Daily  . furosemide  120 mg Intravenous Q12H  . Gerhardt's butt cream   Topical BID  . guaiFENesin  600 mg Oral BID  . insulin aspart  0-5 Units Subcutaneous Q4H  . insulin detemir  5 Units Subcutaneous QHS  . levothyroxine  112.5 mcg Oral QAC breakfast  . mouth rinse  15 mL Mouth Rinse BID  . senna-docusate  1 tablet Oral Daily  . sodium chloride flush  10-40 mL Intracatheter Q12H  . sodium chloride flush  3 mL Intravenous Q12H   Elmarie Shiley, MD 12/27/2016, 9:06 AM

## 2016-12-27 NOTE — Progress Notes (Signed)
  Paged with red blood noted with bowel movement. Continues to have epistaxis as well.   Hgb stable.  Continues to have poor urine output.  < 200 so far today.   Repeat Coox. Will move to 1200 am.  Heparin level pending as well. May need to adjust.   Can consider GI consult if bleeding in stool continues or if Hgb drops. Right now would not be good candidate for Endoscopy/sedation with tenuous status. Continue to follow closely.   Will discuss with MD.   Lollie Marrow, PA-C 12/27/2016 11:34 AM

## 2016-12-27 NOTE — Consult Note (Signed)
           [facility] CM Primary Care Navigator  12/27/2016  LOUANNA VANLIEW 11-07-1945 [id number]   Went to see patientat the bedside to identify possible discharge needsbut staff reports that patient was transferred to 4N 30 (ICU) yesterday due to hypotension.  Will attempt to meet with patient at another time when transferred out of ICU.  For questions, please contact:  Dannielle Huh, BSN, RN- [facility] Primary Care Navigator  Telephone: [phone] [place]

## 2016-12-27 NOTE — Progress Notes (Signed)
ANTICOAGULATION CONSULT NOTE - Follow Up Consult  Pharmacy Consult for Heparin Indication: atrial fibrillation  Allergies  Allergen Reactions  . Cephalexin Swelling and Rash  . Ciprofloxacin Swelling and Rash  . Zyvox [Linezolid] Other (See Comments)    Thrombocytopenia developed to 50k after several weeks of therapy  . Adhesive [Tape] Other (See Comments)    "old [employer] adhesive tape; takes my skin off; can use paper tape"  . Bactrim [Sulfamethoxazole-Trimethoprim] Other (See Comments)    Hyperkalemia (elevated potassium) and Increased creatinine  . Daptomycin Other (See Comments)    Had elevated CPK on therapy, not clear that this was due to cubicin  . Vancomycin Other (See Comments)    ? If this played role in her Acute kidney injury    Patient Measurements: Height: 5\' 3"  (160 cm) Weight: 175 lb 11.3 oz (79.7 kg) IBW/kg (Calculated) : 52.4 Heparin Dosing Weight: ~70kg  Vital Signs: Temp: 97.6 F (36.4 C) (03/15 1100) Temp Source: Oral (03/15 1100) Pulse Rate: 94 (03/15 1300)  Labs:  Recent Labs  12/18/2016 2016 12/25/16 0156 12/25/16 0705 12/25/16 2217 12/26/16 0201 12/26/16 1538 12/27/16 0500  HGB  --   --  14.2  --  15.4*  --  13.5  HCT  --   --  43.8  --  47.3*  --  41.1  PLT  --   --  147*  --  150  --  126*  HEPARINUNFRC  --   --   --  0.51 0.64  --  0.84*  CREATININE  --   --  1.99*  --  2.41* 2.56* 2.77*  TROPONINI 0.06* 0.06* 0.06*  --   --   --   --     Estimated Creatinine Clearance: 18.6 mL/min (A) (by C-G formula based on SCr of 2.77 mg/dL (H)).  Medications: Heparin @ 850 units/hr  Assessment: 70yof on heparin for afib. Heparin level was high this morning (0.84) and rate decreased. This afternoon patient developed epistaxis and also was noted to have blood in her stool so heparin was stopped. Follow up level never obtained.  Goal of Therapy:  Heparin level 0.3-0.7 units/ml Monitor platelets by anticoagulation protocol: Yes   Plan:   1) Hold heparin for now 2) Follow up bleeding / when to resume  Deboraha Sprang 12/27/2016,2:21 PM

## 2016-12-28 LAB — RENAL FUNCTION PANEL
Albumin: 2.7 g/dL — ABNORMAL LOW (ref 3.5–5.0)
Albumin: 2.7 g/dL — ABNORMAL LOW (ref 3.5–5.0)
Anion gap: 9 (ref 5–15)
Anion gap: 12 (ref 5–15)
BUN: 86 mg/dL — ABNORMAL HIGH (ref 6–20)
BUN: 85 mg/dL — ABNORMAL HIGH (ref 6–20)
CALCIUM: 8.8 mg/dL — AB (ref 8.9–10.3)
CHLORIDE: 96 mmol/L — AB (ref 101–111)
CO2: 21 mmol/L — AB (ref 22–32)
CO2: 20 mmol/L — AB (ref 22–32)
CREATININE: 3.06 mg/dL — AB (ref 0.44–1.00)
Calcium: 8.7 mg/dL — ABNORMAL LOW (ref 8.9–10.3)
Chloride: 94 mmol/L — ABNORMAL LOW (ref 101–111)
Creatinine, Ser: 2.97 mg/dL — ABNORMAL HIGH (ref 0.44–1.00)
GFR calc Af Amer: 17 mL/min — ABNORMAL LOW (ref >60)
GFR calc non Af Amer: 14 mL/min — ABNORMAL LOW (ref >60)
GFR, EST AFRICAN AMERICAN: 17 mL/min — AB (ref >60)
GFR, EST NON AFRICAN AMERICAN: 15 mL/min — AB (ref >60)
GLUCOSE: 163 mg/dL — AB (ref 65–99)
Glucose, Bld: 176 mg/dL — ABNORMAL HIGH (ref 65–99)
POTASSIUM: 4.3 mmol/L (ref 3.5–5.1)
Phosphorus: 5.4 mg/dL — ABNORMAL HIGH (ref 2.5–4.6)
Phosphorus: 5.5 mg/dL — ABNORMAL HIGH (ref 2.5–4.6)
Potassium: 4.3 mmol/L (ref 3.5–5.1)
Sodium: 126 mmol/L — ABNORMAL LOW (ref 135–145)
Sodium: 126 mmol/L — ABNORMAL LOW (ref 135–145)

## 2016-12-28 LAB — COOXEMETRY PANEL
Carboxyhemoglobin: 1.2 % (ref 0.5–1.5)
Methemoglobin: 1 % (ref 0.0–1.5)
O2 Saturation: 63.8 %
Total hemoglobin: 13.7 g/dL (ref 12.0–16.0)

## 2016-12-28 LAB — CBC
HCT: 41.4 % (ref 36.0–46.0)
Hemoglobin: 13.6 g/dL (ref 12.0–15.0)
MCH: 29.9 pg (ref 26.0–34.0)
MCHC: 32.9 g/dL (ref 30.0–36.0)
MCV: 91 fL (ref 78.0–100.0)
Platelets: 127 K/uL — ABNORMAL LOW (ref 150–400)
RBC: 4.55 MIL/uL (ref 3.87–5.11)
RDW: 16.4 % — ABNORMAL HIGH (ref 11.5–15.5)
WBC: 7.6 K/uL (ref 4.0–10.5)

## 2016-12-28 LAB — GLUCOSE, CAPILLARY
GLUCOSE-CAPILLARY: 164 mg/dL — AB (ref 65–99)
Glucose-Capillary: 179 mg/dL — ABNORMAL HIGH (ref 65–99)
Glucose-Capillary: 183 mg/dL — ABNORMAL HIGH (ref 65–99)
Glucose-Capillary: 93 mg/dL (ref 65–99)

## 2016-12-28 LAB — URINE CULTURE: Culture: NO GROWTH

## 2016-12-28 LAB — T4, FREE: Free T4: 1.22 ng/dL — ABNORMAL HIGH (ref 0.61–1.12)

## 2016-12-28 MED ORDER — ACETAMINOPHEN 160 MG/5ML PO SOLN
325.0000 mg | Freq: Four times a day (QID) | ORAL | Status: DC | PRN
  Administered 2016-12-28 (×2): 650 mg via ORAL
  Filled 2016-12-28 (×2): qty 20.3

## 2016-12-28 MED ORDER — INSULIN DETEMIR 100 UNIT/ML ~~LOC~~ SOLN
5.0000 [IU] | Freq: Two times a day (BID) | SUBCUTANEOUS | Status: DC
  Administered 2016-12-28: 3 [IU] via SUBCUTANEOUS
  Administered 2016-12-28: 5 [IU] via SUBCUTANEOUS
  Filled 2016-12-28 (×3): qty 0.05

## 2016-12-28 MED ORDER — FUROSEMIDE 10 MG/ML IJ SOLN
160.0000 mg | Freq: Three times a day (TID) | INTRAMUSCULAR | Status: DC
  Administered 2016-12-28 – 2016-12-29 (×4): 160 mg via INTRAVENOUS
  Filled 2016-12-28 (×5): qty 16

## 2016-12-28 MED ORDER — HEPARIN (PORCINE) IN NACL 100-0.45 UNIT/ML-% IJ SOLN
800.0000 [IU]/h | INTRAMUSCULAR | Status: DC
  Administered 2016-12-28: 800 [IU]/h via INTRAVENOUS

## 2016-12-28 MED ORDER — HYDROXYZINE HCL 10 MG PO TABS
10.0000 mg | ORAL_TABLET | Freq: Three times a day (TID) | ORAL | Status: DC | PRN
  Administered 2016-12-28: 10 mg via ORAL
  Filled 2016-12-28 (×2): qty 1

## 2016-12-28 MED ORDER — INSULIN ASPART 100 UNIT/ML ~~LOC~~ SOLN
0.0000 [IU] | Freq: Three times a day (TID) | SUBCUTANEOUS | Status: DC
  Administered 2016-12-28 (×3): 3 [IU] via SUBCUTANEOUS
  Administered 2016-12-31: 2 [IU] via SUBCUTANEOUS
  Administered 2016-12-31 (×2): 3 [IU] via SUBCUTANEOUS
  Administered 2017-01-01: 5 [IU] via SUBCUTANEOUS
  Administered 2017-01-01: 3 [IU] via SUBCUTANEOUS
  Administered 2017-01-01: 5 [IU] via SUBCUTANEOUS
  Administered 2017-01-02: 3 [IU] via SUBCUTANEOUS
  Administered 2017-01-02 – 2017-01-03 (×4): 5 [IU] via SUBCUTANEOUS
  Administered 2017-01-03: 3 [IU] via SUBCUTANEOUS
  Administered 2017-01-04: 5 [IU] via SUBCUTANEOUS
  Administered 2017-01-04: 2 [IU] via SUBCUTANEOUS
  Administered 2017-01-04: 5 [IU] via SUBCUTANEOUS

## 2016-12-28 MED ORDER — METOLAZONE 5 MG PO TABS
5.0000 mg | ORAL_TABLET | Freq: Two times a day (BID) | ORAL | Status: DC
  Administered 2016-12-28 – 2016-12-29 (×3): 5 mg via ORAL
  Filled 2016-12-28 (×3): qty 1

## 2016-12-28 NOTE — Progress Notes (Signed)
Pt nose bleed has slowed, RN able to ease off pressure.  Oozing still noted. Will continue to apply pressure until bleeding has stopped.

## 2016-12-28 NOTE — Progress Notes (Signed)
ANTICOAGULATION CONSULT NOTE - Follow Up Consult ANTIBIOTIC CONSULT NOTE - Follow Up Consult  Pharmacy Consult for Heparin and Aztreonam Indication: atrial fibrillation and UTI  Allergies  Allergen Reactions  . Cephalexin Swelling and Rash  . Ciprofloxacin Swelling and Rash  . Zyvox [Linezolid] Other (See Comments)    Thrombocytopenia developed to 50k after several weeks of therapy  . Adhesive [Tape] Other (See Comments)    "old [employer] adhesive tape; takes my skin off; can use paper tape"  . Bactrim [Sulfamethoxazole-Trimethoprim] Other (See Comments)    Hyperkalemia (elevated potassium) and Increased creatinine  . Daptomycin Other (See Comments)    Had elevated CPK on therapy, not clear that this was due to cubicin  . Vancomycin Other (See Comments)    ? If this played role in her Acute kidney injury    Patient Measurements: Height: 5\' 3"  (160 cm) Weight: 175 lb 11.3 oz (79.7 kg) IBW/kg (Calculated) : 52.4 Heparin Dosing Weight: ~70kg  Vital Signs: Pulse Rate: 77 (03/16 0700)  Labs:  Recent Labs  12/25/16 2217  12/26/16 0201  12/27/16 0500 12/27/16 1400 12/28/16 0556 12/28/16 0602  HGB  --   < > 15.4*  --  13.5  --   --  13.6  HCT  --   --  47.3*  --  41.1  --   --  41.4  PLT  --   --  150  --  126*  --   --  127*  HEPARINUNFRC 0.51  --  0.64  --  0.84*  --   --   --   CREATININE  --   --  2.41*  < > 2.77* 2.85* 2.97*  --   < > = values in this interval not displayed.  Estimated Creatinine Clearance: 17.4 mL/min (A) (by C-G formula based on SCr of 2.97 mg/dL (H)).  Assessment: 70yof started on heparin for afib, however, she developed epistaxis and melena yesterday and heparin was held. Bleeding has pretty much resolved today so ok to resume heparin but at a lower rate and will aim for a lower goal. CBC has remained stable.  She is also on day # 4 aztreonam for Ecoli UTI (multiple antibiotic allergies). Dose appropriate for renal function.  Aztreonam  3/12 >> 3/17 Doxy 3/12 >> 3/13  3/12 MRSA PCR negative 3/12 urine: > 100k Ecoli (R-amp, unasyn) 3/14 blood: ngtd 3/15 urine:    Goal of Therapy:  Heparin level 0.3-0.5 Monitor platelets by anticoagulation protocol: Yes   Plan:  1) Resume heparin at 800 units/hr 2) Check 8 hour heparin level 3) Continue aztreonam 500mg  IV q8 - add stop date 3/17 (5 days total)  Deboraha Sprang 12/28/2016,10:28 AM

## 2016-12-28 NOTE — Progress Notes (Signed)
Patient ID: [name], female   DOB: December 18, 1945, 71 y.o.   MRN: [id number]  [facility] Progress Note   Assessment/ Plan:   1. Acute kidney injury on chronic kidney disease stage III: Suspect cardiorenal syndrome with superimposed ATN with relative hypotension/cardiogenic shock. Overnight, poor response to furosemide/milrinone and metolazone added today to augment diuresis. If remains refractory, we will need CRRT for ultrafiltration while on pressors. I had a lengthy discussion with the patient regarding my reservation about chronic hemodialysis which I do not think she would tolerate well with her barriers of mobility and poor EF/ischemic cardiomyopathy. She understands this but is not prepared to "just give up". 2. Acute exacerbation of congestive heart failure: History of ischemic cardiomyopathy with EF of 15%. Ongoing support with a renal and intravenous furosemide. 3. Hyponatremia: Secondary to CHF exacerbation and acute on chronic renal insufficiency-monitor with inotropic support/diuretic. 4. New onset atrial fibrillation/flutter: Remains rate controlled and currently on amiodarone drip.  Subjective:   Continue to have anxiety overnight arising from epistaxis and a sensation of choking.    Objective:   BP (!) 88/47 (BP Location: Left Leg)   Pulse 77   Temp 97.6 F (36.4 C) (Oral)   Resp 12   Ht 5\' 3"  (1.6 m)   Wt 79.7 kg (175 lb 11.3 oz)   SpO2 94%   BMI 31.13 kg/m   Intake/Output Summary (Last 24 hours) at 12/28/16 1108 Last data filed at 12/28/16 0700  Gross per 24 hour  Intake          1138.77 ml  Output              335 ml  Net           803.77 ml   Weight change:   Physical Exam: [id number] sitting on the side of her bed CVS: Pulse irregularly irregular, 3/6 holosystolic murmur, elevated JVD Resp: Fine rales midlung fields with diminished breath sounds over bases Abd: Soft, obese, nontender Ext: 2+ lower extremity edema with Ace wrap  over her extremities   Imaging: Dg Chest Port 1 View  Result Date: 12/27/2016 CLINICAL DATA:  Cough. EXAM: PORTABLE CHEST 1 VIEW COMPARISON:  12/26/2016. FINDINGS: Right IJ line noted in stable position. Prior CABG. Stable cardiomegaly. Interim improvement of diffuse right lung infiltrate and/or edema. Stable pleural effusions. No pneumothorax. IMPRESSION: 1. Right IJ line stable position. 2. Prior CABG. Stable cardiomegaly. Interim partial clearing of diffuse right lung infiltrate/edema. Stable bilateral pleural effusions . Electronically Signed   By: Marcello Moores  Register   On: 12/27/2016 07:28    Labs: BMET  Recent Labs Lab 12/21/2016 1505 12/25/16 0705 12/26/16 0201 12/26/16 1538 12/27/16 0500 12/27/16 1400 12/28/16 0556  NA 132* 133* 132* 131* 128* 128* 126*  K 4.4 3.9 4.7 5.4* 4.9 4.6 4.3  CL 97* 99* 98* 101 98* 96* 96*  CO2 23 22 23  18* 20* 21* 21*  GLUCOSE 369* 134*  136* 61* 193* 211* 257* 176*  BUN 80* 76* 75* 78* 83* 84* 86*  CREATININE 2.20* 1.99* 2.41* 2.56* 2.77* 2.85* 2.97*  CALCIUM 9.1 8.9 8.9 8.7* 8.8* 8.7* 8.7*  PHOS  --  4.3 5.0* 5.3* 5.7* 5.6* 5.4*   CBC  Recent Labs Lab 12/25/16 0705 12/26/16 0201 12/27/16 0500 12/28/16 0602  WBC 6.5 7.2 6.1 7.6  NEUTROABS  --  5.5  --   --   HGB 14.2 15.4* 13.5 13.6  HCT 43.8 47.3* 41.1 41.4  MCV 92.0 92.9 91.1  91.0  PLT 147* 150 126* 127*   Medications:    . aspirin EC  81 mg Oral Daily  . atorvastatin  40 mg Oral QHS  . aztreonam  500 mg Intravenous Q8H  . Chlorhexidine Gluconate Cloth  6 each Topical Daily  . furosemide  160 mg Intravenous Q8H  . Gerhardt's butt cream   Topical BID  . guaiFENesin  600 mg Oral BID  . insulin aspart  0-15 Units Subcutaneous TID WC  . insulin detemir  5 Units Subcutaneous BID  . levothyroxine  112.5 mcg Oral QAC breakfast  . mouth rinse  15 mL Mouth Rinse BID  . metolazone  5 mg Oral BID  . senna-docusate  1 tablet Oral Daily  . sodium chloride flush  10-40 mL Intracatheter  Q12H  . sodium chloride flush  3 mL Intravenous Q12H   Elmarie Shiley, MD 12/28/2016, 11:08 AM

## 2016-12-28 NOTE — Progress Notes (Signed)
Heparin has been put on hold at present during nose bleed.  RN to contact MD.

## 2016-12-28 NOTE — Progress Notes (Signed)
PULMONARY / CRITICAL CARE MEDICINE   Name: [name] MRN: [id number] DOB: 11-05-1945    ADMISSION DATE:  12/23/2016 CONSULTATION DATE:  12/26/16  REFERRING MD:  Maylene Roes  CHIEF COMPLAINT:  Hypotension  HISTORY OF PRESENT ILLNESS:  Pt is encephelopathic; therefore, this HPI is obtained from chart review. [name] is a 71 y.o. female with PMH as outlined below. She was admitted 12/15/2016 with decreased UOP.  She had called cardiology prior to coming to ED who had instructed her to increase her torsemide.  She was found to have AoCKD and hypovolemia.  She was seen by cardiology and nephrology 3/13.  Diuretics were held and she had improvement in renal function but cardiology felt she was developing volume overload.  On 3/13, she also developed new onset A.fib with RVR for which she was started on amiodarone and IV heparin.  Heart failure team was asked to see and were planning to see 12/26/16.  During early AM hours 3/14, she developed worsening hypotension.  Of note, she had received 12.5mg  carvedilol at 1747 and 30mg  Imdur at 2211 the evening prior.  TRH evaluated pt at bedside and ordered 728ml NS bolus with improvement in SBP to mid 80's.  She was also started on dobutamine.  PCCM was subsequently asked to see and transfer to ICU for further evaluation and management.  Of note, she had echo 3/13 which demonstrated EF 15%, mild AS and AR, mild MR, mod TR.  Previous echo Oct 2016 had EF 25-30% with G2DD.   SUBJECTIVE:  Sitting up at bedside Denies pain, dyspnea Remains on milrinone and norepi  VITAL SIGNS: BP (!) 88/47 (BP Location: Left Leg)   Pulse 77   Temp 97.6 F (36.4 C) (Oral)   Resp 12   Ht 5\' 3"  (1.6 m)   Wt 79.7 kg (175 lb 11.3 oz)   SpO2 94%   BMI 31.13 kg/m   HEMODYNAMICS:    VENTILATOR SETTINGS:    INTAKE / OUTPUT: I/O last 3 completed shifts: In: 1726 [I.V.:1286; IV Piggyback:440] Out: 470 [Urine:470]   PHYSICAL EXAMINATION: General:  chronically ill, up at bedside Neuro: awake alert appropriate, moves all ext HEENT: no lesions Cardiovascular: irregular, distant  Lungs: clear B  Abdomen: soft, benign Musculoskeletal: hx L foot amputation Skin: chronic wounds b feet, dressed   LABS:  BMET  Recent Labs Lab 12/27/16 0500 12/27/16 1400 12/28/16 0556  NA 128* 128* 126*  K 4.9 4.6 4.3  CL 98* 96* 96*  CO2 20* 21* 21*  BUN 83* 84* 86*  CREATININE 2.77* 2.85* 2.97*  GLUCOSE 211* 257* 176*    Electrolytes  Recent Labs Lab 01/05/2017 2016  12/25/16 0705 12/26/16 0201  12/27/16 0500 12/27/16 1400 12/28/16 0556  CALCIUM  --   --  8.9 8.9  < > 8.8* 8.7* 8.7*  MG 2.2  --  2.2 2.5*  --   --   --   --   PHOS  --   < > 4.3 5.0*  < > 5.7* 5.6* 5.4*  < > = values in this interval not displayed.  CBC  Recent Labs Lab 12/26/16 0201 12/27/16 0500 12/28/16 0602  WBC 7.2 6.1 7.6  HGB 15.4* 13.5 13.6  HCT 47.3* 41.1 41.4  PLT 150 126* 127*    Coag's No results for input(s): APTT, INR in the last 168 hours.  Sepsis Markers  Recent Labs Lab 12/26/16 0201 12/26/16 0259  LATICACIDVEN 2.4* 1.82    ABG No results  for input(s): PHART, PCO2ART, PO2ART in the last 168 hours.  Liver Enzymes  Recent Labs Lab 12/19/2016 2016 12/25/16 0705 12/26/16 0201  12/27/16 0500 12/27/16 1400 12/28/16 0556  AST 24 23 30   --   --   --   --   ALT 15 13* 15  --   --   --   --   ALKPHOS 115 104 112  --   --   --   --   BILITOT 1.1 1.1 0.7  --   --   --   --   ALBUMIN 3.1* 2.9* 2.9*  < > 2.6* 2.7* 2.7*  < > = values in this interval not displayed.  Cardiac Enzymes  Recent Labs Lab 01/09/2017 2016 12/25/16 0156 12/25/16 0705  TROPONINI 0.06* 0.06* 0.06*    Glucose  Recent Labs Lab 12/27/16 0501 12/27/16 0802 12/27/16 1151 12/27/16 1614 12/27/16 2009 12/28/16 0024  GLUCAP 214* 157* 189* 217* 215* 179*    Imaging No results found.   STUDIES:  Echo 3/13 > EF 15%, mild AS and AR, mild MR, mod  TR. V/Q 3/14 > low prob  CULTURES: Blood 3/14 > Urine 3/12 > E coli > R to unasyn, S otherwise; ESBL negative  ANTIBIOTICS: Aztreonam 3/12 > 3/13; 3/14 >>  Doxy 3/12 > 3/13   SIGNIFICANT EVENTS: 3/12 > admit. 3/14 > new hypotension > transfer to ICU > started on dobutamine.  LINES/TUBES: CVL 3/14 >  DISCUSSION: 71 y.o. female with hx CKD, combined heart failure, admitted 3/12 with oliguria and decompensated heart failure.  Developed hypotension early AM 3/14. E coli UTI.   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 Hypotension - likely due to above + BP meds + possible sepsis due to UTI New onset A.Fib RVR - started on amio and heparin 3/13. Hx PVD, HTN, HLD, CAD s/p CABG 1993, AS. P:  - milrinone + norepi - follow SvO2 - anticoag per cardiology plans - continue amiodarone - ASA, lipitor - Diuretics as per Renal  PULMONARY A: Acute hypoxic respiratory failure, likely due to pulm edema P:   - pulmonary hygiene,  - continue rx for her CHF  RENAL A:   AoCKD, progressive, consistent with ATN and cardiorenal syndrome; S Cr appears to be plateauing 3/16 Hyponatremia - due to above. P:   - diuretics as adjusted by Cardiology and Renal - follow UOP and BMP  GASTROINTESTINAL A:   GERD. Nutrition. P:   - diet as ordered  HEMATOLOGIC A: Hemoconcentration.   VTE Prophylaxis. P:  - follow CBC  INFECTIOUS A:   UTI - E coli R to PCN's (no ESBL) Cellulitis of LE's - s/p treatment with doxy. P:   - Abx as ordered > aztreonam should be adequate coverage in absence ESBL, continue  - wound care following   ENDOCRINE A:   DM. Hypothyroidism. P:   - SSI, levamir - synthroid PO, note T4  NEUROLOGIC A:   Hx anxiety P:   - follow, on home xanax  Family updated: Pt updated; None at bedside 3/16  Interdisciplinary Family Meeting v Palliative Care Meeting:  Due  by: 01/01/17.  Appreciate Dr Aundra Dubin taking her on his service for further care. Please call if we can assist  Independent CC time 31 minutes  Baltazar Apo, MD, PhD 12/28/2016, 11:37 AM [facility] [phone] or if no answer [phone]

## 2016-12-28 NOTE — Progress Notes (Signed)
Pt nose is bleeding. Pressure applied.

## 2016-12-28 NOTE — Progress Notes (Signed)
Notifed Daune Perch NP of nose bleed lasting 40 minutes, and heparin has been put on hold.  To stop heparin for now.  Will continue to monitor pt closely.

## 2016-12-28 NOTE — Progress Notes (Signed)
PT Cancellation Note  Patient Details Name: ATIYAH BAUER MRN: [id number] DOB: 1946-01-28   Cancelled Treatment:    Reason Eval/Treat Not Completed: Patient declined, no reason specified. Pt just finished bathing at EOB with RN and too fatigued to participate in therapy at this time. PT will continue to f/u with pt as available and appropriate.    Clearnce Sorrel Sherlon Nied 12/28/2016, 12:32 PM

## 2016-12-28 NOTE — Progress Notes (Signed)
Notified by nursing that pt has blood in urine again and nose bleeds. Heparin discontinued.   Daune Perch, NP

## 2016-12-28 NOTE — Progress Notes (Signed)
Patient has had intermittent nosebleeds, Heparin stopped during daytime. One episode of a mild bloody nose before bed with no furtherproblems while patient was sleeping. Though out my shift patient has been distressed over her stuffed up nose which I ordered saline spray from prn order set. When patient desat while sleeping 2L [place] applied with humidification. Patient heard moaning and crying out around 0245 and on entering patient crying about her stuffed up nose and how she cant breath because of it. Patient used nose spray and after developed nosebleed. Advised her it needed to have pressure held by pinching but patient refused stating "Then I really wont be able to breath through it". Assisted to sitting position and patient continued to hold tissue to it and not wanting to apply pressure. "I'm pressing with the tissue" when offered to apply pressure and stats she thinks it is slowing down.

## 2016-12-28 NOTE — Progress Notes (Signed)
Notified pharmacy that pt having blood in urine and now also having nose bleeds. Will continue to monitor pt closely.

## 2016-12-28 NOTE — Progress Notes (Signed)
Advanced Heart Failure Rounding Note  Referring Physician: Dr Elsworth Soho Primary Physician:Dr. Carlota Raspberry Primary Cardiologist:  Dr. Harrington Challenger  Reason for Consultation: Cardiogenic shock   Subjective:    Developed shock overnight 12/26/16. Central line and Art line placed. Started on dobutamine. AHF team consulted. Echo significant for EF 30% down to 15%.  Coox pending on Milrinone 0.375 and levophed 3.    CVP 20-21  Remains restless and anxious.  Continued to have mild nosebleeds overnight with holding of heparin.  Pt did try and use saline spray which seemed to aggravate it.   Foley placed yesterday and lasix increased. Only 470 cc of urine output.   Creatinine 2.85 -> 2.97. Rate of rise slowing. BUN 86  Art line pressures primarily in 100-110s.  ECG from yesterday personally reviewed, atrial fibrillation.  Looks like atypical flutter on telemetry this morning.   Objective:   Weight Range: 175 lb 11.3 oz (79.7 kg) Body mass index is 31.13 kg/m.   Vital Signs:   Temp:  [97.6 F (36.4 C)] 97.6 F (36.4 C) (03/15 1100) Pulse Rate:  [74-102] 81 (03/16 0600) Resp:  [11-23] 14 (03/16 0600) SpO2:  [86 %-98 %] 98 % (03/16 0600) Arterial Line BP: (82-131)/(41-69) 131/68 (03/16 0600) Last BM Date: 12/27/16  Weight change: Filed Weights   12/26/16 0247 12/26/16 0430  Weight: 158 lb 15.2 oz (72.1 kg) 175 lb 11.3 oz (79.7 kg)    Intake/Output:   Intake/Output Summary (Last 24 hours) at 12/28/16 0716 Last data filed at 12/28/16 0600  Gross per 24 hour  Intake          1295.65 ml  Output              470 ml  Net           825.65 ml     Physical Exam: General:  Elderly and fatigued appearing. Anxious.    HEENT: Normal. Dry blood around bilateral nares.   Neck: Thick. JVP difficult but appears elevated to jaw. Carotids 2+ bilat; no bruits. No thyromegaly or nodule noted.  Cor: PMI nondisplaced. Irregular. Rate normal. No S3/S4. 1/6 SEM RUSB.  Lungs: Diminished throughout, R  base dull.   Abdomen: Obese, distended, edematous. 1+ pitting. No hepatosplenomegaly. No bruits or masses. +BS Extremities: no cyanosis, clubbing, rash. 1-2+ edema into flanks. S/p L foot transmetatarsal amputation.  Neuro: alert & orientedx3, cranial nerves grossly intact. moves all 4 extremities w/o difficulty. Affect flat but appropriate.  Telemetry: Personally reviewed, A-flutter 80-90s  Labs: CBC  Recent Labs  12/26/16 0201 12/27/16 0500 12/28/16 0602  WBC 7.2 6.1 7.6  NEUTROABS 5.5  --   --   HGB 15.4* 13.5 13.6  HCT 47.3* 41.1 41.4  MCV 92.9 91.1 91.0  PLT 150 126* 578*   Basic Metabolic Panel  Recent Labs  12/26/16 0201  12/27/16 1400 12/28/16 0556  NA 132*  < > 128* 126*  K 4.7  < > 4.6 4.3  CL 98*  < > 96* 96*  CO2 23  < > 21* 21*  GLUCOSE 61*  < > 257* 176*  BUN 75*  < > 84* 86*  CREATININE 2.41*  < > 2.85* 2.97*  CALCIUM 8.9  < > 8.7* 8.7*  MG 2.5*  --   --   --   PHOS 5.0*  < > 5.6* 5.4*  < > = values in this interval not displayed. Liver Function Tests  Recent Labs  12/26/16 0201  12/27/16 1400  12/28/16 0556  AST 30  --   --   --   ALT 15  --   --   --   ALKPHOS 112  --   --   --   BILITOT 0.7  --   --   --   PROT 6.8  --   --   --   ALBUMIN 2.9*  < > 2.7* 2.7*  < > = values in this interval not displayed. No results for input(s): LIPASE, AMYLASE in the last 72 hours. Cardiac Enzymes No results for input(s): CKTOTAL, CKMB, CKMBINDEX, TROPONINI in the last 72 hours.  BNP: BNP (last 3 results)  Recent Labs  10/12/16 1026 12/10/16 1431 01/04/2017 2016  BNP 1,200.9* 1,134.8* 706.0*    ProBNP (last 3 results)  Recent Labs  12/10/16 1431  PROBNP 21,500*     D-Dimer No results for input(s): DDIMER in the last 72 hours. Hemoglobin A1C No results for input(s): HGBA1C in the last 72 hours. Fasting Lipid Panel No results for input(s): CHOL, HDL, LDLCALC, TRIG, CHOLHDL, LDLDIRECT in the last 72 hours. Thyroid Function Tests No  results for input(s): TSH, T4TOTAL, T3FREE, THYROIDAB in the last 72 hours.  Invalid input(s): FREET3  Other results:     Imaging/Studies:  Dg Chest Port 1 View  Result Date: 12/27/2016 CLINICAL DATA:  Cough. EXAM: PORTABLE CHEST 1 VIEW COMPARISON:  12/26/2016. FINDINGS: Right IJ line noted in stable position. Prior CABG. Stable cardiomegaly. Interim improvement of diffuse right lung infiltrate and/or edema. Stable pleural effusions. No pneumothorax. IMPRESSION: 1. Right IJ line stable position. 2. Prior CABG. Stable cardiomegaly. Interim partial clearing of diffuse right lung infiltrate/edema. Stable bilateral pleural effusions . Electronically Signed   By: Marcello Moores  Register   On: 12/27/2016 07:28      Medications:     Scheduled Medications: . aspirin EC  81 mg Oral Daily  . atorvastatin  40 mg Oral QHS  . aztreonam  500 mg Intravenous Q8H  . Chlorhexidine Gluconate Cloth  6 each Topical Daily  . furosemide  120 mg Intravenous Q12H  . Gerhardt's butt cream   Topical BID  . guaiFENesin  600 mg Oral BID  . insulin aspart  0-5 Units Subcutaneous Q4H  . insulin detemir  5 Units Subcutaneous QHS  . levothyroxine  112.5 mcg Oral QAC breakfast  . mouth rinse  15 mL Mouth Rinse BID  . senna-docusate  1 tablet Oral Daily  . sodium chloride flush  10-40 mL Intracatheter Q12H  . sodium chloride flush  3 mL Intravenous Q12H    Infusions: . amiodarone 30 mg/hr (12/27/16 1805)  . milrinone 0.375 mcg/kg/min (12/27/16 1804)  . norepinephrine (LEVOPHED) Adult infusion 3 mcg/min (12/28/16 0500)    PRN Medications: Place/Maintain arterial line **AND** sodium chloride, acetaminophen (TYLENOL) oral liquid 160 mg/5 mL, ALPRAZolam, guaiFENesin-dextromethorphan, levalbuterol, ondansetron **OR** ondansetron (ZOFRAN) IV, sodium chloride, sodium chloride flush   Assessment/Plan   ANDA SOBOTTA is a 71 y.o. female CAD s/p CABG, chronic combined CHF, moderate aortic stenosis, DM, HLD, PVD  followed by Dr. Donnetta Hutching s/p L foot transmetatarsal amputation, hypothyroidism, syncope, CKD stage III-IV, and Iron/B12 deficiency.  Admitted for edema and poor urine output.  Found to be hypotensive and in cardiogenic shock. Also found to be in [place] Afib. ? Infection origin/early + UA, UCx with E. Coli and CXR concerning for PNA.  1. Acute on chronic biventricular CHF: Ischemic cardiomyopathy.  Echo with admission with EF 15%, severely decreased RV systolic function, mid  to moderate aortic stenosis.  CVP 20-21 today.  Pt remains markedly volume overloaded on exam. Co-ox 64%.  - Cardiac output appears adequate on milrinone 0.375 mcg/kg/min and levophed 3 - Increased lasix to 160 mg IV TID.  Will give metolazone 5 mg bid today.  Suspect cardiorenal syndrome vs ATN with hypotension overnight into 12/26/16, this is making diuresis difficult.  If she continues to have poor diuresis, may need CVVH.  - She does not have ICD. Will eventually need consideration with long standing low EF. Unstable currently and with chronic leg wounds so this would be down the road.  - Not good candidate for advanced therapies with RV failure, renal failiure, and limited mobility. 2. Atrial fibrillation versus atypical flutter: Remains in Afib but rate improved on amio gtt.  - Continue amio gtt for now.   - Plan eventual TEE/DCCV once stable.  - Will need anticoag once stable with CHA2DS2/VASc of 6.  Running heparin at lower rate with nose bleeds.  3. AKI on CKD stage 3: Baseline creatinine 1.4-1.6.  Creatinine continues to trend up but rate of rise seems to be slowing. Suspect component of cardiorenal syndrome but also ATN with hypotensive episode initially.  - Nephrology following. Will increase lasix to high dose as above and continue to follow.  If she continues to diurese poorly, may need CVVH.  4. CAD: No chest pain. Last cath in 12/12 with patent LIMA to LAD with occlusion of distal LAD; High grade stenosis of nondominant RCA;  Signif disease of prox LCx; SVG to L PDA occluded, SVG to OM patent).  Attempted PTCA of proximal LCx but failed 10/2011. No good  revascularization options.   - Continue ASA 81 and statin for now.  5. PAD: Extensive, especially in the left leg.  She is s/p left transmetatarsal amputation.  This has led to her limited mobility. Followed by [facility].  - No change to current plan.  6. UTI: E coli. Treating with aztreonam. Per primary. No change.  7. Right pleural effusion: -May need to consider thoracentesis.   8. Aortic stenosis: Mild to possibly moderate on echo. No change to current plan.  9. Epistaxis: Somewhat improved, though with recurrence overnight after nasal spray. Hgb stable. - Will try running heparin gtt for Afib at lower rate.  10. Hyponatremia: Fluid restrict 1500 cc.   Length of Stay: 4  Annamaria Helling  12/28/2016, 7:16 AM  Advanced Heart Failure Team Pager [phone] (M-F; 7a - 4p)  Please contact [facility] for night-coverage after hours (4p -7a ) and weekends on [email or web address]  Patient seen with PA, agree with the above note.  Co-ox good today, will continue current support with milrinone + low dose norepinephrine.  CVP remains > 20, very volume overloaded. Difficult to diurese, suspect ATN from hypotensive episode earlier in hospital stay.  Cardiorenal syndrome also likely playing a role.  Creatinine slightly higher today.  - Continue milrinone + norepinephrine.  - High dose Lasix and will add bid metolazone.  - Fluid restrict with hyponatremia.  - If she does not improve and diurese, may end up needing CVVH to try to unload volume.  Prognosis quite guarded at this point.  As above, poor candidate for advanced cardiac therapies and not a very good long term HD candidate either.    Loralie Champagne 12/28/2016 8:10 AM

## 2016-12-29 ENCOUNTER — Inpatient Hospital Stay (HOSPITAL_COMMUNITY): Payer: Medicare Other

## 2016-12-29 ENCOUNTER — Encounter (HOSPITAL_COMMUNITY): Payer: Self-pay | Admitting: Interventional Radiology

## 2016-12-29 DIAGNOSIS — I482 Chronic atrial fibrillation: Secondary | ICD-10-CM

## 2016-12-29 DIAGNOSIS — J9601 Acute respiratory failure with hypoxia: Secondary | ICD-10-CM

## 2016-12-29 HISTORY — PX: IR GENERIC HISTORICAL: IMG1180011

## 2016-12-29 LAB — RENAL FUNCTION PANEL
ALBUMIN: 2.6 g/dL — AB (ref 3.5–5.0)
ANION GAP: 12 (ref 5–15)
Albumin: 2.6 g/dL — ABNORMAL LOW (ref 3.5–5.0)
Anion gap: 13 (ref 5–15)
BUN: 88 mg/dL — ABNORMAL HIGH (ref 6–20)
BUN: 84 mg/dL — AB (ref 6–20)
CHLORIDE: 92 mmol/L — AB (ref 101–111)
CO2: 20 mmol/L — ABNORMAL LOW (ref 22–32)
CO2: 18 mmol/L — AB (ref 22–32)
CREATININE: 3.2 mg/dL — AB (ref 0.44–1.00)
Calcium: 8.7 mg/dL — ABNORMAL LOW (ref 8.9–10.3)
Calcium: 8.1 mg/dL — ABNORMAL LOW (ref 8.9–10.3)
Chloride: 93 mmol/L — ABNORMAL LOW (ref 101–111)
Creatinine, Ser: 3.07 mg/dL — ABNORMAL HIGH (ref 0.44–1.00)
GFR calc Af Amer: 16 mL/min — ABNORMAL LOW (ref >60)
GFR calc non Af Amer: 14 mL/min — ABNORMAL LOW (ref >60)
GFR, EST AFRICAN AMERICAN: 17 mL/min — AB (ref >60)
GFR, EST NON AFRICAN AMERICAN: 14 mL/min — AB (ref >60)
GLUCOSE: 141 mg/dL — AB (ref 65–99)
Glucose, Bld: 74 mg/dL (ref 65–99)
PHOSPHORUS: 5.4 mg/dL — AB (ref 2.5–4.6)
POTASSIUM: 4.3 mmol/L (ref 3.5–5.1)
Phosphorus: 6.2 mg/dL — ABNORMAL HIGH (ref 2.5–4.6)
Potassium: 4.4 mmol/L (ref 3.5–5.1)
SODIUM: 125 mmol/L — AB (ref 135–145)
Sodium: 123 mmol/L — ABNORMAL LOW (ref 135–145)

## 2016-12-29 LAB — COOXEMETRY PANEL
Carboxyhemoglobin: 1.2 % (ref 0.5–1.5)
Methemoglobin: 0.9 % (ref 0.0–1.5)
O2 Saturation: 66.5 %
Total hemoglobin: 13.4 g/dL (ref 12.0–16.0)

## 2016-12-29 LAB — CBC
HEMATOCRIT: 38.9 % (ref 36.0–46.0)
Hemoglobin: 13.1 g/dL (ref 12.0–15.0)
MCH: 30 pg (ref 26.0–34.0)
MCHC: 33.7 g/dL (ref 30.0–36.0)
MCV: 89 fL (ref 78.0–100.0)
Platelets: 118 10*3/uL — ABNORMAL LOW (ref 150–400)
RBC: 4.37 MIL/uL (ref 3.87–5.11)
RDW: 16 % — AB (ref 11.5–15.5)
WBC: 8.1 10*3/uL (ref 4.0–10.5)

## 2016-12-29 LAB — LACTIC ACID, PLASMA: Lactic Acid, Venous: 1.3 mmol/L (ref 0.5–1.9)

## 2016-12-29 LAB — POCT I-STAT 3, ART BLOOD GAS (G3+)
ACID-BASE DEFICIT: 7 mmol/L — AB (ref 0.0–2.0)
BICARBONATE: 17.4 mmol/L — AB (ref 20.0–28.0)
O2 SAT: 91 %
PO2 ART: 60 mmHg — AB (ref 83.0–108.0)
TCO2: 18 mmol/L (ref 0–100)
pCO2 arterial: 31.5 mmHg — ABNORMAL LOW (ref 32.0–48.0)
pH, Arterial: 7.345 — ABNORMAL LOW (ref 7.350–7.450)

## 2016-12-29 LAB — GLUCOSE, CAPILLARY
GLUCOSE-CAPILLARY: 178 mg/dL — AB (ref 65–99)
Glucose-Capillary: 88 mg/dL (ref 65–99)
Glucose-Capillary: 68 mg/dL (ref 65–99)
Glucose-Capillary: 200 mg/dL — ABNORMAL HIGH (ref 65–99)
Glucose-Capillary: 117 mg/dL — ABNORMAL HIGH (ref 65–99)
Glucose-Capillary: 148 mg/dL — ABNORMAL HIGH (ref 65–99)

## 2016-12-29 LAB — PROTIME-INR
INR: 1.2
Prothrombin Time: 15.2 seconds (ref 11.4–15.2)

## 2016-12-29 LAB — T3, FREE: T3, Free: 0.8 pg/mL — ABNORMAL LOW (ref 2.0–4.4)

## 2016-12-29 MED ORDER — LIDOCAINE HCL (PF) 1 % IJ SOLN
INTRAMUSCULAR | Status: AC
  Filled 2016-12-29: qty 30

## 2016-12-29 MED ORDER — HEPARIN SODIUM (PORCINE) 1000 UNIT/ML DIALYSIS
1000.0000 [IU] | INTRAMUSCULAR | Status: DC | PRN
  Filled 2016-12-29: qty 6

## 2016-12-29 MED ORDER — LIDOCAINE HCL 1 % IJ SOLN
INTRAMUSCULAR | Status: AC | PRN
  Administered 2016-12-29: 10 mL

## 2016-12-29 MED ORDER — PRISMASOL BGK 4/2.5 32-4-2.5 MEQ/L IV SOLN
INTRAVENOUS | Status: DC
  Administered 2016-12-29 – 2017-01-04 (×6): via INTRAVENOUS_CENTRAL
  Filled 2016-12-29 (×9): qty 5000

## 2016-12-29 MED ORDER — PRISMASOL BGK 4/2.5 32-4-2.5 MEQ/L IV SOLN
INTRAVENOUS | Status: DC
  Administered 2016-12-29 – 2017-01-05 (×41): via INTRAVENOUS_CENTRAL
  Filled 2016-12-29 (×57): qty 5000

## 2016-12-29 MED ORDER — HEPARIN SODIUM (PORCINE) 1000 UNIT/ML IJ SOLN
INTRAMUSCULAR | Status: AC
  Administered 2016-12-29: 3100 [IU]
  Filled 2016-12-29: qty 1

## 2016-12-29 MED ORDER — HEPARIN (PORCINE) 2000 UNITS/L FOR CRRT
INTRAVENOUS_CENTRAL | Status: DC | PRN
  Filled 2016-12-29: qty 1000

## 2016-12-29 MED ORDER — PRISMASOL BGK 4/2.5 32-4-2.5 MEQ/L IV SOLN
INTRAVENOUS | Status: DC
  Administered 2016-12-29 – 2017-01-04 (×12): via INTRAVENOUS_CENTRAL
  Filled 2016-12-29 (×17): qty 5000

## 2016-12-29 MED ORDER — ORAL CARE MOUTH RINSE
15.0000 mL | Freq: Four times a day (QID) | OROMUCOSAL | Status: DC
  Administered 2016-12-30 – 2017-01-05 (×16): 15 mL via OROMUCOSAL

## 2016-12-29 MED ORDER — NOREPINEPHRINE BITARTRATE 1 MG/ML IV SOLN
0.0000 ug/min | INTRAVENOUS | Status: DC
  Administered 2016-12-29: 8 ug/min via INTRAVENOUS
  Administered 2016-12-30: 10 ug/min via INTRAVENOUS
  Administered 2017-01-01: 18 ug/min via INTRAVENOUS
  Administered 2017-01-03: 20 ug/min via INTRAVENOUS
  Administered 2017-01-03 – 2017-01-04 (×2): 34 ug/min via INTRAVENOUS
  Administered 2017-01-04: 28 ug/min via INTRAVENOUS
  Administered 2017-01-05: 30 ug/min via INTRAVENOUS
  Filled 2016-12-29 (×15): qty 16

## 2016-12-29 MED ORDER — DEXTROSE 50 % IV SOLN
INTRAVENOUS | Status: AC
  Administered 2016-12-29: 50 mL
  Filled 2016-12-29: qty 50

## 2016-12-29 NOTE — Progress Notes (Signed)
Dr. Glade Lloyd MD notified of pt respiratory distress, grunting, shallow breathing.  Orders received.  Will continue to monitor pt closely.

## 2016-12-29 NOTE — Progress Notes (Signed)
Blood gas results reviewed by Rexene Edison, NP who is here to assess pt.

## 2016-12-29 NOTE — Progress Notes (Signed)
Dr. Aundra Dubin notified of hypotension and difficulty obtaining O2 saturation readings. Pt alert and oriented x 4. Orders received.  Will continue to monitor pt closely.

## 2016-12-29 NOTE — Progress Notes (Signed)
ANTICOAGULATION CONSULT NOTE - Follow Up Consult  Pharmacy Consult for Heparin Indication: atrial fibrillation  Allergies  Allergen Reactions  . Cephalexin Swelling and Rash  . Ciprofloxacin Swelling and Rash  . Zyvox [Linezolid] Other (See Comments)    Thrombocytopenia developed to 50k after several weeks of therapy  . Adhesive [Tape] Other (See Comments)    "old [employer] adhesive tape; takes my skin off; can use paper tape"  . Bactrim [Sulfamethoxazole-Trimethoprim] Other (See Comments)    Hyperkalemia (elevated potassium) and Increased creatinine  . Daptomycin Other (See Comments)    Had elevated CPK on therapy, not clear that this was due to cubicin  . Vancomycin Other (See Comments)    ? If this played role in her Acute kidney injury    Patient Measurements: Height: 5\' 3"  (160 cm) Weight: 175 lb 11.3 oz (79.7 kg) IBW/kg (Calculated) : 52.4 Heparin Dosing Weight: ~70kg  Vital Signs: Temp: 95.8 F (35.4 C) (03/17 0800) Temp Source: Axillary (03/17 0800)  Labs:  Recent Labs  12/27/16 0500  12/28/16 0556 12/28/16 0602 12/28/16 1753 12/29/16 0358 12/29/16 0400  HGB 13.5  --   --  13.6  --  13.1  --   HCT 41.1  --   --  41.4  --  38.9  --   PLT 126*  --   --  127*  --  118*  --   HEPARINUNFRC 0.84*  --   --   --   --   --   --   CREATININE 2.77*  < > 2.97*  --  3.06*  --  3.07*  < > = values in this interval not displayed.  Estimated Creatinine Clearance: 16.8 mL/min (A) (by C-G formula based on SCr of 3.07 mg/dL (H)).  Medications: Heparin on hold  Assessment: 70yof on heparin for afib with CHA2DS2/VASc of 6.  Heparin currently on hold with recurrent epistaxis and some hematuria. Per Dr. Aundra Dubin note, hold heparin today and try to resume tomorrow.  Goal of Therapy:  Heparin level 0.3-0.7 units/ml Monitor platelets by anticoagulation protocol: Yes   Plan:  1) Hold heparin for now 2) Follow up bleeding / when to resume  Melburn Popper 12/29/2016,9:59 AM

## 2016-12-29 NOTE — Progress Notes (Signed)
eLink Physician-Brief Progress Note Patient Name: [name] DOB: November 17, 1945 MRN: [id number]   Date of Service  12/29/2016  HPI/Events of Note  Patient returns for Trialysis Catheter placed with sat's dow to 88%. Now placed on 100% NRBM with sat = 100% and RR = 16. Trialysis cath placed under Fleuro in IR. IR doesn't mention pneumothorax as a complication. BS present bilaterally per bedside nurse.   eICU Interventions  Will order: 1. Portable CXR STAT  2. Ground team to evaluate at bedside.  3. Will ask bedside nurse to expedite the CRRT ordered by nephrology.       Intervention Category Major Interventions: Hypoxemia - evaluation and management  Sommer,Steven Eugene 12/29/2016, 5:52 PM

## 2016-12-29 NOTE — Progress Notes (Signed)
Patient ID: [name], female   DOB: 1946-03-30, 71 y.o.   MRN: [id number]     Advanced Heart Failure Rounding Note  Referring Physician: Dr [name] Primary Physician:Dr. Carlota [name] Primary Cardiologist:  Dr. Harrington [name]  Reason for Consultation: Cardiogenic shock   Subjective:    Developed shock overnight 12/26/16. Central line and Art line placed. Started on dobutamine. AHF team consulted. Echo significant for EF 30% down to 15%.  Coox 66.5% on Milrinone 0.375 and levophed 3.    CVP 25 this morning.   Heparin gtt has been off since yesterday pm with ongoing epistaxis and some hematuria as well.   [name] is short of breath at rest, has to sit up.     Creatinine 2.85 -> 2.97 -> 3.07. Rate of rise slowing.  [name] is oliguric with 270 cc UOP yesterday despite high dose diuretics.   Art line pressures primarily in 100s.  Objective:   Weight Range: 175 lb 11.3 oz (79.7 kg) Body mass index is 31.13 kg/m.   Vital Signs:   Temp:  [95.7 F (35.4 C)-96.6 F (35.9 C)] 95.8 F (35.4 C) (03/17 0800) Pulse Rate:  [78-103] 103 (03/16 2100) Resp:  [13-22] 16 (03/16 2100) BP: (132)/(62) 132/62 (03/16 1427) SpO2:  [89 %-96 %] 91 % (03/16 2100) Arterial Line BP: (112-136)/(51-68) 128/68 (03/16 2100) Last BM Date: 12/27/16  Weight change: Filed Weights   12/26/16 0247 12/26/16 0430  Weight: 158 lb 15.2 oz (72.1 kg) 175 lb 11.3 oz (79.7 kg)    Intake/Output:   Intake/Output Summary (Last 24 hours) at 12/29/16 0944 Last data filed at 12/29/16 0600  Gross per 24 hour  Intake             1701 ml  Output              270 ml  Net             1431 ml     Physical Exam: General:  Elderly and fatigued appearing. Sitting on side of bed.    Neck: Thick. JVP 16+. No thyromegaly or nodule noted.  Cor: PMI nondisplaced. Irregular. Rate normal. No S3/S4. 1/6 SEM RUSB.  Lungs: Crackles at bases bilaterally.   Abdomen: Obese, distended, edematous. 1+ pitting. No hepatosplenomegaly. No bruits or  masses. +BS Extremities: no cyanosis, clubbing, rash. 1-2+ edema into flanks. S/p L foot transmetatarsal amputation.  Neuro: alert & orientedx3, cranial nerves grossly intact. moves all 4 extremities w/o difficulty. Normal affect.   Telemetry: Personally reviewed, atrial fibrillation 80s  Labs: CBC  Recent Labs  12/28/16 0602 12/29/16 0358  WBC 7.6 8.1  HGB 13.6 13.1  HCT 41.4 38.9  MCV 91.0 89.0  PLT 127* 315*   Basic Metabolic Panel  Recent Labs  12/28/16 1753 12/29/16 0400  NA 126* 125*  K 4.3 4.3  CL 94* 93*  CO2 20* 20*  GLUCOSE 163* 74  BUN 85* 88*  CREATININE 3.06* 3.07*  CALCIUM 8.8* 8.7*  PHOS 5.5* 5.4*   Liver Function Tests  Recent Labs  12/28/16 1753 12/29/16 0400  ALBUMIN 2.7* 2.6*   No results for input(s): LIPASE, AMYLASE in the last 72 hours. Cardiac Enzymes No results for input(s): CKTOTAL, CKMB, CKMBINDEX, TROPONINI in the last 72 hours.  BNP: BNP (last 3 results)  Recent Labs  10/12/16 1026 12/10/16 1431 12/16/2016 2016  BNP 1,200.9* 1,134.8* 706.0*    ProBNP (last 3 results)  Recent Labs  12/10/16 1431  PROBNP 21,500*  D-Dimer No results for input(s): DDIMER in the last 72 hours. Hemoglobin A1C No results for input(s): HGBA1C in the last 72 hours. Fasting Lipid Panel No results for input(s): CHOL, HDL, LDLCALC, TRIG, CHOLHDL, LDLDIRECT in the last 72 hours. Thyroid Function Tests  Recent Labs  12/28/16 0556  T3FREE 0.8*    Other results:     Imaging/Studies:  No results found.    Medications:     Scheduled Medications: . aspirin EC  81 mg Oral Daily  . atorvastatin  40 mg Oral QHS  . aztreonam  500 mg Intravenous Q8H  . Chlorhexidine Gluconate Cloth  6 each Topical Daily  . furosemide  160 mg Intravenous Q8H  . Gerhardt's butt cream   Topical BID  . guaiFENesin  600 mg Oral BID  . insulin aspart  0-15 Units Subcutaneous TID WC  . insulin detemir  5 Units Subcutaneous BID  . levothyroxine   112.5 mcg Oral QAC breakfast  . mouth rinse  15 mL Mouth Rinse BID  . metolazone  5 mg Oral BID  . senna-docusate  1 tablet Oral Daily  . sodium chloride flush  10-40 mL Intracatheter Q12H  . sodium chloride flush  3 mL Intravenous Q12H    Infusions: . amiodarone 30 mg/hr (12/28/16 1945)  . heparin Stopped (12/28/16 1645)  . milrinone 0.375 mcg/kg/min (12/29/16 9528)  . norepinephrine (LEVOPHED) Adult infusion 3 mcg/min (12/29/16 4132)    PRN Medications: Place/Maintain arterial line **AND** sodium chloride, acetaminophen (TYLENOL) oral liquid 160 mg/5 mL, ALPRAZolam, guaiFENesin-dextromethorphan, hydrOXYzine, levalbuterol, ondansetron **OR** ondansetron (ZOFRAN) IV, sodium chloride, sodium chloride flush   Assessment/Plan   [name] is a 71 y.o. female CAD s/p CABG, chronic combined CHF, moderate aortic stenosis, DM, HLD, PVD followed by Dr. Donnetta [name] s/p L foot transmetatarsal amputation, hypothyroidism, syncope, CKD stage III-IV, and Iron/B12 deficiency.  Admitted for edema and poor urine output.  Found to be hypotensive and in cardiogenic shock. Also found to be in [place] Afib. ? Infection origin/early + UA, UCx with E. Coli and CXR concerning for PNA.  1. Acute on chronic biventricular CHF: Ischemic cardiomyopathy.  Echo with admission with EF 15%, severely decreased RV systolic function, mid to moderate aortic stenosis.  CVP 25 today.  [name] remains markedly volume overloaded on exam. Co-ox 66.5%. Cardiac output appears adequate on milrinone 0.375 mcg/kg/min and levophed 3.  [name] has diuresed poorly despite high dose diuretics, creatinine elevated.  - Continue 160 mg IV TID with metolazone 5 mg bid today.  Suspect cardiorenal syndrome vs ATN with hypotension overnight into 12/26/16, this is making diuresis difficult.  It does not appear that we are going to make progress with diuretics given [name] likely ATN.  At this point, [name] think [name] will need CVVH.  This should likely be today with  very high CVP and increase dyspnea/orthopnea.    - [name] does not have ICD. Will eventually need consideration with long standing low EF if [name] recovers from this hospitalization. Unstable currently and with chronic leg wounds so this would be down the road.  - Not good candidate for advanced therapies with RV failure, renal failiure, and limited mobility. 2. Atrial fibrillation versus atypical flutter: Remains in Afib but rate improved on amio gtt.  - Continue amio gtt for now.   - Plan eventual TEE/DCCV if [name] stabilizes.  - Will need anticoag once stable with CHA2DS2/VASc of 6.  Heparin currently on hold with recurrent epistaxis and some hematuria.  [name] will give  [name] a break on heparin today, will try to restart tomorrow.  3. AKI on CKD stage 3: Baseline creatinine 1.4-1.6.  Creatinine continues to trend up but rate of rise seems to be slowing. Suspect component of cardiorenal syndrome but also ATN with hypotensive episode initially.  - [name] have discussed with [name] the poor response to diuretics.  I do not think that [name] is a long-term HD candidate.  However, [name] says that [name] "does not want to give up."  At this point, [name] think that we need a trial of CVVH to see if volume unloading will help [name] renal function.  Nephrology to see today.  4. CAD: No chest pain. Last cath in 12/12 with patent LIMA to LAD with occlusion of distal LAD; High grade stenosis of nondominant RCA; Signif disease of prox LCx; SVG to L PDA occluded, SVG to OM patent).  Attempted PTCA of proximal LCx but failed 10/2011. No good  revascularization options.   - Continue ASA 81 and statin for now.  5. PAD: Extensive, especially in the left leg.  [name] is s/p left transmetatarsal amputation.  This has led to [name] limited mobility. Followed by [facility].  - No change to current plan.  6. UTI: E coli. Treating with aztreonam. Per CCM.   7. Right pleural effusion:  May need to consider thoracentesis eventually.   8. Aortic stenosis: Mild to possibly  moderate on echo. No change to current plan.  9. Epistaxis: Recurrent, holding heparin for now.   10. Hyponatremia: Fluid restrict 1500 cc.  11. Thrombocytopenia: Mild, may be due to acute illness. However, has been getting heparin. Follow for now.   [name] think that prognosis is increasingly poor.  As above, would be reasonable to give [name] a trial of CVVH but do not think [name] could tolerate long-term HD.   Length of Stay: [place], MD  12/29/2016, 9:44 AM  Advanced Heart Failure Team Pager [phone] (M-F; 7a - 4p)  Please contact [facility] for night-coverage after hours (4p -7a ) and weekends on [email or web address]

## 2016-12-29 NOTE — Progress Notes (Signed)
PULMONARY / CRITICAL CARE MEDICINE   Name: [name] MRN: [id number] DOB: [birth year]    ADMISSION DATE:  01/07/2017 CONSULTATION DATE:  12/26/16  REFERRING MD:  Maylene Roes  CHIEF COMPLAINT:  Hypotension  HISTORY OF PRESENT ILLNESS:  Pt is encephelopathic; therefore, this HPI is obtained from chart review. [name] is a 71 y.o. female with PMH as outlined below. She was admitted 12/28/2016 with decreased UOP.  She had called cardiology prior to coming to ED who had instructed her to increase her torsemide.  She was found to have AoCKD and hypovolemia.  She was seen by cardiology and nephrology 3/13.  Diuretics were held and she had improvement in renal function but cardiology felt she was developing volume overload.  On 3/13, she also developed new onset A.fib with RVR for which she was started on amiodarone and IV heparin.  Heart failure team was asked to see and were planning to see 12/26/16.  During early AM hours 3/14, she developed worsening hypotension.  Of note, she had received 12.5mg  carvedilol at 1747 and 30mg  Imdur at 2211 the evening prior.  TRH evaluated pt at bedside and ordered 767ml NS bolus with improvement in SBP to mid 80's.  She was also started on dobutamine.  PCCM was subsequently asked to see and transfer to ICU for further evaluation and management.  Of note, she had echo 3/13 which demonstrated EF 15%, mild AS and AR, mild MR, mod TR.  Previous echo Oct 2016 had EF 25-30% with G2DD.   SUBJECTIVE:  Called to see pt emergently after episode of hypotension and hypoxia,, she was getting up to use bedside commode . b/p dropped and sats in 70s. She was placed back in bed, Levophed was increased and O2 was increased to 4l/m .  Pt recovered within 27min . O2 sats 95-98% on 4l/m . b/p >100.  Pt for CRRT , needs HD cath placed.    VITAL SIGNS: BP (!) 102/55   Pulse 84   Temp (!) 95.7 F (35.4 C) (Axillary)   Resp 17   Ht 5\' 3"  (1.6 m)   Wt 175 lb 11.3 oz (79.7  kg)   SpO2 94%   BMI 31.13 kg/m   HEMODYNAMICS: CVP:  [25 mmHg] 25 mmHg  VENTILATOR SETTINGS:    INTAKE / OUTPUT: I/O last 3 completed shifts: In: 2793.8 [P.O.:840; I.V.:1559.8; IV Piggyback:394] Out: 12 [Urine:470]   PHYSICAL EXAMINATION: General chronically ill appearing , on O2 in bed  Neuro: awake /alert, follows commands, no focal deficits noted,  HEENT: dry mucosa  Cardiovascular irreg/ Distant HS  Lungs: Diminshed BS in bases  Abdomen: soft , NT  MusculoskeletaL : hx of left foot amputation  Skin: chronic ext wounds/dsg.   LABS:  BMET  Recent Labs Lab 12/28/16 0556 12/28/16 1753 12/29/16 0400  NA 126* 126* 125*  K 4.3 4.3 4.3  CL 96* 94* 93*  CO2 21* 20* 20*  BUN 86* 85* 88*  CREATININE 2.97* 3.06* 3.07*  GLUCOSE 176* 163* 74    Electrolytes  Recent Labs Lab 01/12/2017 2016  12/25/16 0705 12/26/16 0201  12/28/16 0556 12/28/16 1753 12/29/16 0400  CALCIUM  --   --  8.9 8.9  < > 8.7* 8.8* 8.7*  MG 2.2  --  2.2 2.5*  --   --   --   --   PHOS  --   < > 4.3 5.0*  < > 5.4* 5.5* 5.4*  < > = values in this  interval not displayed.  CBC  Recent Labs Lab 12/27/16 0500 12/28/16 0602 12/29/16 0358  WBC 6.1 7.6 8.1  HGB 13.5 13.6 13.1  HCT 41.1 41.4 38.9  PLT 126* 127* 118*    Coag's  Recent Labs Lab 12/29/16 1144  INR 1.20    Sepsis Markers  Recent Labs Lab 12/26/16 0201 12/26/16 0259 12/29/16 1247  LATICACIDVEN 2.4* 1.82 1.3    ABG  Recent Labs Lab 12/29/16 1301  PHART 7.345*  PCO2ART 31.5*  PO2ART 60.0*    Liver Enzymes  Recent Labs Lab 12/31/2016 2016 12/25/16 0705 12/26/16 0201  12/28/16 0556 12/28/16 1753 12/29/16 0400  AST 24 23 30   --   --   --   --   ALT 15 13* 15  --   --   --   --   ALKPHOS 115 104 112  --   --   --   --   BILITOT 1.1 1.1 0.7  --   --   --   --   ALBUMIN 3.1* 2.9* 2.9*  < > 2.7* 2.7* 2.6*  < > = values in this interval not displayed.  Cardiac Enzymes  Recent Labs Lab  12/15/2016 2016 12/25/16 0156 12/25/16 0705  TROPONINI 0.06* 0.06* 0.06*    Glucose  Recent Labs Lab 12/28/16 1242 12/28/16 1636 12/28/16 2111 12/29/16 0811 12/29/16 1216 12/29/16 1240  GLUCAP 178* 183* 93 88 68 200*    Imaging No results found.   STUDIES:  Echo 3/13 > EF 15%, mild AS and AR, mild MR, mod TR. V/Q 3/14 > low prob  CULTURES: Blood 3/14 > Urine 3/12 > E coli > R to unasyn, S otherwise; ESBL negative  ANTIBIOTICS: Aztreonam 3/12 > 3/13; 3/14 >>  Doxy 3/12 > 3/13   SIGNIFICANT EVENTS: 3/12 > admit. 3/14 > new hypotension > transfer to ICU > started on dobutamine.  LINES/TUBES: CVL 3/14 >  DISCUSSION: 71 y.o. female with hx CKD, combined heart failure, admitted 3/12 with oliguria and decompensated heart failure.  Developed hypotension early AM 3/14. E coli UTI.   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 Hypotension - likely due to above + BP meds + possible sepsis due to UTI New onset A.Fib RVR - started on amio and heparin 3/13. Hx PVD, HTN, HLD, CAD s/p CABG 1993, AS. -LA 1.3 (3/17)  P:  - milrinone + norepi - follow SvO2 - anticoag per cardiology plans - continue amiodarone - ASA, lipitor - Diuretics as per Renal - titrate pressors for MAP goal >65. -check CXR (after HD cath)   PULMONARY A: Acute hypoxic respiratory failure, likely due to pulm edema P:   - pulmonary hygiene,  - continue rx for her CHF  RENAL A:   AoCKD, progressive, consistent with ATN and cardiorenal syndrome; S Cr appears to be plateauing 3/16 Hyponatremia - due to above. P:   - diuretics as adjusted by Cardiology and Renal - follow UOP and BMP -for CRRT per renal  -place HD cath   GASTROINTESTINAL A:   GERD. Nutrition. P:   - diet as ordered  HEMATOLOGIC A: Hemoconcentration.   VTE Prophylaxis. P:  - follow CBC  INFECTIOUS A:   UTI - E coli R  to PCN's (no ESBL) Cellulitis of LE's - s/p treatment with doxy. P:   - Abx as ordered > aztreonam should be adequate coverage in absence ESBL,  continue  - wound care following   ENDOCRINE A:   DM. Hypothyroidism. BS trending down  P:   - SSI -d/c Levemir  - synthroid PO, note T4  NEUROLOGIC A:   Hx anxiety P:   - follow, on home xanax  Family updated: Pt updated; None at bedside 3/16  Interdisciplinary Family Meeting v Palliative Care Meeting:  Due by: 01/01/17.  Toria Monte NP-C  [facility]  [phone]   12/29/2016

## 2016-12-29 NOTE — Progress Notes (Signed)
All RN documentation completed by Carleene Cooper RN up until 1033.

## 2016-12-29 NOTE — Progress Notes (Signed)
Hypoglycemic Event  CBG:68 Treatment: D50 IV 50 mL  Symptoms: None  Follow-up CBG: Time:1240 CBG Result:200 Possible Reasons for Event: Inadequate meal intake  Comments/MD notified:Dr. Aundra Dubin and Dr.  Dewayne Hatch, Winifred Olive

## 2016-12-29 NOTE — Progress Notes (Signed)
Patient ID: [name], female   DOB: 09/28/1946, 71 y.o.   MRN: [id number]  [facility] Progress Note   Assessment/ Plan:   1. Acute kidney injury on chronic kidney disease stage III: Suspect cardiorenal syndrome with superimposed ATN with relative hypotension/cardiogenic shock. Refractory to high-dose diuretics with inotropic support overnight. Will consult with critical care to assist with dialysis catheter placement and thereafter start her on CRRT with the primary objective of ultrafiltration. It is unclear whether this will translate to long-term dialysis needs and will continue to have discussions regarding this given her poor candidacy for chronic therapies. 2. Acute exacerbation of congestive heart failure: History of ischemic cardiomyopathy with EF of 15%. So far, she has failed to respond to diuretic/inotropes-plan to start CRRT for ultrafiltration today. 3. Hyponatremia: Secondary to CHF exacerbation and acute on chronic renal insufficiency- no evident renal recovery and anticipate to improve with CRRT/ultrafiltration. 4. New onset atrial fibrillation/flutter: Remains rate controlled and currently on amiodarone drip.  Subjective:   Heparin drip discontinued last night after she developed hematuria in addition to recurrent episodes of epistaxis.    Objective:   BP 132/62   Pulse (!) 102   Temp (!) 95.8 F (35.4 C) (Axillary)   Resp (!) 0   Ht 5\' 3"  (1.6 m)   Wt 79.7 kg (175 lb 11.3 oz)   SpO2 93%   BMI 31.13 kg/m   Intake/Output Summary (Last 24 hours) at 12/29/16 1017 Last data filed at 12/29/16 1000  Gross per 24 hour  Intake             1828 ml  Output              285 ml  Net             1543 ml   Weight change:   Physical Exam: [id number] sitting on the side of her bed CVS: Pulse irregularly irregularTachycardia, 3/6 holosystolic murmur, elevated JVD Resp: Fine rales midlung fields with diminished breath sounds over bases Abd: Soft,  obese, nontender Ext: 2+ lower extremity edema with Ace wrap over her extremities   Imaging: No results found.  Labs: BMET  Recent Labs Lab 12/26/16 0201 12/26/16 1538 12/27/16 0500 12/27/16 1400 12/28/16 0556 12/28/16 1753 12/29/16 0400  NA 132* 131* 128* 128* 126* 126* 125*  K 4.7 5.4* 4.9 4.6 4.3 4.3 4.3  CL 98* 101 98* 96* 96* 94* 93*  CO2 23 18* 20* 21* 21* 20* 20*  GLUCOSE 61* 193* 211* 257* 176* 163* 74  BUN 75* 78* 83* 84* 86* 85* 88*  CREATININE 2.41* 2.56* 2.77* 2.85* 2.97* 3.06* 3.07*  CALCIUM 8.9 8.7* 8.8* 8.7* 8.7* 8.8* 8.7*  PHOS 5.0* 5.3* 5.7* 5.6* 5.4* 5.5* 5.4*   CBC  Recent Labs Lab 12/26/16 0201 12/27/16 0500 12/28/16 0602 12/29/16 0358  WBC 7.2 6.1 7.6 8.1  NEUTROABS 5.5  --   --   --   HGB 15.4* 13.5 13.6 13.1  HCT 47.3* 41.1 41.4 38.9  MCV 92.9 91.1 91.0 89.0  PLT 150 126* 127* 118*   Medications:    . aspirin EC  81 mg Oral Daily  . atorvastatin  40 mg Oral QHS  . aztreonam  500 mg Intravenous Q8H  . Chlorhexidine Gluconate Cloth  6 each Topical Daily  . furosemide  160 mg Intravenous Q8H  . Gerhardt's butt cream   Topical BID  . guaiFENesin  600 mg Oral BID  . insulin aspart  0-15  Units Subcutaneous TID WC  . insulin detemir  5 Units Subcutaneous BID  . levothyroxine  112.5 mcg Oral QAC breakfast  . mouth rinse  15 mL Mouth Rinse BID  . metolazone  5 mg Oral BID  . senna-docusate  1 tablet Oral Daily  . sodium chloride flush  10-40 mL Intracatheter Q12H  . sodium chloride flush  3 mL Intravenous Q12H   Elmarie Shiley, MD 12/29/2016, 10:17 AM

## 2016-12-29 NOTE — Progress Notes (Signed)
  Variable  0 Points 1 Point 2 Points Total  Heart rate per minute  <90 beats 90-109 beats >110 beats 1  Respiratory  Rate per minute < 18 breaths 19-30 breaths  >30 breaths 1  Restlessness; nonpurposeful movements None  occas slight movement Frequent movement 0  Paradoxical breathing pattern: None  Present 0  Accessory muscle use: rise in clavicle during inspiration None Slight rise Pronounced rise 1  Grunting at end-expiration: guttural sound None  Present 1  Nasal flaring: involuntary movement of nares None  Present 0  Look of fear None  Eyes wide 1  Overall total out of 16    5   Mild to  Mod resp distress on face mask o2 and on reverse trendelenburg  Discussed intubation v bipap (She has claustrophobia hx but never tired bipap) v Face mask  Plan Try bipap Do CRRT Hopuefly ward off intubatin - intubation can be risky both for process and failure to wean   She and brother agreeable with plan  Dr. Brand Males, M.D., [facility].C.P Pulmonary and Critical Care Medicine Staff Physician [facility] Pager: [phone], If no answer or between  15:00h - 7:00h: call [phone]  12/29/2016 6:32 PM

## 2016-12-29 NOTE — Progress Notes (Signed)
Dr. Chase Caller notified of changes in pt condition. Orders received. Will continue to monitor pt closely.

## 2016-12-29 NOTE — Progress Notes (Signed)
Dr. Aundra Dubin at bedside to assess pt. Orders received.  Will continue to monitor pt closely.

## 2016-12-29 NOTE — Progress Notes (Signed)
Pt fell asleep once positioned on left side.  Blood pressure dropped in to 46I systolic, nail beds dusky, circumoral cyanosis noted, O2 sat not reading.  Pt easily aroused, alert and oriented x 4.  Levophed drip titrated up, oxygen increased. Placing new oxygen sat probe on pt. Will continue to monitor pt closely.

## 2016-12-29 NOTE — Progress Notes (Signed)
Patient ID: [name], female   DOB: Mar 21, 1946, 71 y.o.   MRN: [id number]  Oxygen saturation dropping, down to 70s currently with good waveform.  Suspect this is primarily due to pulmonary edema.  Will get ABG now, start Bipap (she is alert).  I talked with her about intubation => she tells me that she wants anything right now that will keep her alive.  Will start Bipap for now with possible intubation if no improvement.  As we will need CVVH to correct pulmonary edema, she will likely need to be intubated.  Discussed with CCM.   Loralie Champagne 12/29/2016 12:53 PM

## 2016-12-29 NOTE — Procedures (Signed)
Interventional Radiology Procedure Note  Procedure: Placement of a 24 cm Trialysis catheter vis a LEFT IJ approach.  Catheter tips in upper RA and ready for use.   Complications: None  Estimated Blood Loss: None  Recommendations: - Routine line care  Signed,  Criselda Peaches, MD

## 2016-12-30 LAB — RENAL FUNCTION PANEL
ALBUMIN: 2.3 g/dL — AB (ref 3.5–5.0)
ANION GAP: 11 (ref 5–15)
Albumin: 2.7 g/dL — ABNORMAL LOW (ref 3.5–5.0)
Anion gap: 12 (ref 5–15)
BUN: 62 mg/dL — ABNORMAL HIGH (ref 6–20)
BUN: 40 mg/dL — ABNORMAL HIGH (ref 6–20)
CALCIUM: 7.5 mg/dL — AB (ref 8.9–10.3)
CO2: 21 mmol/L — ABNORMAL LOW (ref 22–32)
CO2: 19 mmol/L — ABNORMAL LOW (ref 22–32)
CREATININE: 2.5 mg/dL — AB (ref 0.44–1.00)
Calcium: 8.6 mg/dL — ABNORMAL LOW (ref 8.9–10.3)
Chloride: 93 mmol/L — ABNORMAL LOW (ref 101–111)
Chloride: 100 mmol/L — ABNORMAL LOW (ref 101–111)
Creatinine, Ser: 1.82 mg/dL — ABNORMAL HIGH (ref 0.44–1.00)
GFR calc Af Amer: 21 mL/min — ABNORMAL LOW (ref >60)
GFR calc non Af Amer: 18 mL/min — ABNORMAL LOW (ref >60)
GFR, EST AFRICAN AMERICAN: 31 mL/min — AB (ref >60)
GFR, EST NON AFRICAN AMERICAN: 27 mL/min — AB (ref >60)
GLUCOSE: 137 mg/dL — AB (ref 65–99)
Glucose, Bld: 155 mg/dL — ABNORMAL HIGH (ref 65–99)
PHOSPHORUS: 3 mg/dL (ref 2.5–4.6)
Phosphorus: 4.5 mg/dL (ref 2.5–4.6)
Potassium: 4.4 mmol/L (ref 3.5–5.1)
Potassium: 4.1 mmol/L (ref 3.5–5.1)
SODIUM: 126 mmol/L — AB (ref 135–145)
SODIUM: 130 mmol/L — AB (ref 135–145)

## 2016-12-30 LAB — CBC
HCT: 40 % (ref 36.0–46.0)
Hemoglobin: 13.6 g/dL (ref 12.0–15.0)
MCH: 30.1 pg (ref 26.0–34.0)
MCHC: 34 g/dL (ref 30.0–36.0)
MCV: 88.5 fL (ref 78.0–100.0)
Platelets: 102 10*3/uL — ABNORMAL LOW (ref 150–400)
RBC: 4.52 MIL/uL (ref 3.87–5.11)
RDW: 16.3 % — ABNORMAL HIGH (ref 11.5–15.5)
WBC: 8.9 10*3/uL (ref 4.0–10.5)

## 2016-12-30 LAB — APTT
APTT: 78 s — AB (ref 24–36)
APTT: 76 s — AB (ref 24–36)
aPTT: 79 seconds — ABNORMAL HIGH (ref 24–36)

## 2016-12-30 LAB — GLUCOSE, CAPILLARY
GLUCOSE-CAPILLARY: 118 mg/dL — AB (ref 65–99)
GLUCOSE-CAPILLARY: 80 mg/dL (ref 65–99)
Glucose-Capillary: 114 mg/dL — ABNORMAL HIGH (ref 65–99)

## 2016-12-30 LAB — MAGNESIUM: Magnesium: 2.3 mg/dL (ref 1.7–2.4)

## 2016-12-30 LAB — COOXEMETRY PANEL
Carboxyhemoglobin: 1 % (ref 0.5–1.5)
METHEMOGLOBIN: 1 % (ref 0.0–1.5)
O2 SAT: 67.7 %
TOTAL HEMOGLOBIN: 13.9 g/dL (ref 12.0–16.0)

## 2016-12-30 MED ORDER — BIVALIRUDIN 250 MG IV SOLR
0.0200 mg/kg/h | INTRAVENOUS | Status: DC
  Administered 2016-12-30: 0.05 mg/kg/h via INTRAVENOUS
  Administered 2017-01-01: 0.02 mg/kg/h via INTRAVENOUS
  Filled 2016-12-30 (×2): qty 250

## 2016-12-30 NOTE — Progress Notes (Signed)
ANTICOAGULATION CONSULT NOTE - Initial Consult  Pharmacy Consult for Bivalirudin Indication: atrial fibrillation/decrease in PLT/also on CRRT  Allergies  Allergen Reactions  . Cephalexin Swelling and Rash  . Ciprofloxacin Swelling and Rash  . Zyvox [Linezolid] Other (See Comments)    Thrombocytopenia developed to 50k after several weeks of therapy  . Adhesive [Tape] Other (See Comments)    "old [employer] adhesive tape; takes my skin off; can use paper tape"  . Bactrim [Sulfamethoxazole-Trimethoprim] Other (See Comments)    Hyperkalemia (elevated potassium) and Increased creatinine  . Daptomycin Other (See Comments)    Had elevated CPK on therapy, not clear that this was due to cubicin  . Vancomycin Other (See Comments)    ? If this played role in her Acute kidney injury    Patient Measurements: Height: 5\' 3"  (160 cm) Weight: 188 lb 15 oz (85.7 kg) IBW/kg (Calculated) : 52.4 Heparin Dosing Weight: ~70kg  Vital Signs: Temp: 97.6 F (36.4 C) (03/18 0400) Temp Source: Axillary (03/18 0400) BP: 103/56 (03/18 0844) Pulse Rate: 102 (03/18 0844)  Labs:  Recent Labs  12/28/16 0602  12/29/16 0358 12/29/16 0400 12/29/16 1144 12/29/16 1901 12/30/16 0411  HGB 13.6  --  13.1  --   --   --  13.6  HCT 41.4  --  38.9  --   --   --  40.0  PLT 127*  --  118*  --   --   --  102*  LABPROT  --   --   --   --  15.2  --   --   INR  --   --   --   --  1.20  --   --   CREATININE  --   < >  --  3.07*  --  3.20* 2.50*  < > = values in this interval not displayed.  Estimated Creatinine Clearance: 21.4 mL/min (A) (by C-G formula based on SCr of 2.5 mg/dL (H)).  Medications: Heparin on hold  Assessment: 70yof previously on heparin for afib with CHA2DS2/VASc of 6.  Heparin was on hold on 3/15 and 3/17 with recurrent epistaxis and hematuria. Per Dr. Aundra Dubin, will resume anticoagulation today. Patient with recent decrease in platelets ~50% from last month. Calculated 4T score ~4. HIT  antibody sent 3/18.   Of note, patient was also started on CRRT 3/17.  Goal of Therapy:  Heparin level 0.3-0.7 units/ml aPTT 50-85 seconds (Pre Dr. Aundra Dubin keep it on lower end of goal due to issues with bleeding: 50-70) Monitor platelets by anticoagulation protocol: Yes   Plan:  1) Start bivalirudin 0.05mg /kg/hr 2) aPTT every 2 hours until therapeutic*2 3) Daily aPTT and CBC  Melburn Popper, PharmD Clinical Pharmacy Resident Pager: [phone] 12/30/16 10:12 AM

## 2016-12-30 NOTE — Progress Notes (Signed)
PULMONARY / CRITICAL CARE MEDICINE   Name: [name] MRN: [id number] DOB: [birth year]    ADMISSION DATE:  12/21/2016 CONSULTATION DATE:  12/26/16  REFERRING MD:  Maylene Roes  CHIEF COMPLAINT:  Hypotension  HISTORY OF PRESENT ILLNESS:  Pt is encephelopathic; therefore, this HPI is obtained from chart review. [name] is a 71 y.o. female with PMH as outlined below. She was admitted 12/22/2016 with decreased UOP.  She had called cardiology prior to coming to ED who had instructed her to increase her torsemide.  She was found to have AoCKD and hypovolemia.  She was seen by cardiology and nephrology 3/13.  Diuretics were held and she had improvement in renal function but cardiology felt she was developing volume overload.  On 3/13, she also developed new onset A.fib with RVR for which she was started on amiodarone and IV heparin.  Heart failure team was asked to see and were planning to see 12/26/16.  During early AM hours 3/14, she developed worsening hypotension.  Of note, she had received 12.5mg  carvedilol at 1747 and 30mg  Imdur at 2211 the evening prior.  TRH evaluated pt at bedside and ordered 761ml NS bolus with improvement in SBP to mid 80's.  She was also started on dobutamine.  PCCM was subsequently asked to see and transfer to ICU for further evaluation and management.  Of note, she had echo 3/13 which demonstrated EF 15%, mild AS and AR, mild MR, mod TR.  Previous echo Oct 2016 had EF 25-30% with G2DD.   SUBJECTIVE:  Pt w/ worsening hypotension overnight, started on Levophed.  BIPAP started  Along CVVH .  She is resting better this am. On [place] with no increased wob .  She is calmer .    VITAL SIGNS: BP (!) 103/56   Pulse (!) 102   Temp 97.6 F (36.4 C) (Axillary)   Resp 16   Ht 5\' 3"  (1.6 m)   Wt 188 lb 15 oz (85.7 kg)   SpO2 97%   BMI 33.47 kg/m   HEMODYNAMICS: CVP:  [20 mmHg-26 mmHg] 20 mmHg  VENTILATOR SETTINGS: FiO2 (%):  [50 %-100 %] 50 %  INTAKE /  OUTPUT: I/O last 3 completed shifts: In: 2276.3 [P.O.:480; I.V.:1646.3; IV Piggyback:150] Out: 2987 [Urine:200; Other:2787]   PHYSICAL EXAMINATION: GEN : chronically ill appearing , on O2 in bed  Neuro : awake/alert, f/c commands,  HEENT : dry mucosa CARD: irreg, no mr/g LUngs: Diminshed BS in bases  ABD : soft and obese, BS + Musculoskeletal: hx of left ft amputation Skin : Chronic leg wounds/dsg   LABS:  BMET  Recent Labs Lab 12/29/16 0400 12/29/16 1901 12/30/16 0411  NA 125* 123* 126*  K 4.3 4.4 4.4  CL 93* 92* 93*  CO2 20* 18* 21*  BUN 88* 84* 62*  CREATININE 3.07* 3.20* 2.50*  GLUCOSE 74 141* 137*    Electrolytes  Recent Labs Lab 12/25/16 0705 12/26/16 0201  12/29/16 0400 12/29/16 1901 12/30/16 0411  CALCIUM 8.9 8.9  < > 8.7* 8.1* 8.6*  MG 2.2 2.5*  --   --   --  2.3  PHOS 4.3 5.0*  < > 5.4* 6.2* 4.5  < > = values in this interval not displayed.  CBC  Recent Labs Lab 12/28/16 0602 12/29/16 0358 12/30/16 0411  WBC 7.6 8.1 8.9  HGB 13.6 13.1 13.6  HCT 41.4 38.9 40.0  PLT 127* 118* 102*    Coag's  Recent Labs Lab 12/29/16 1144  INR  1.20    Sepsis Markers  Recent Labs Lab 12/26/16 0201 12/26/16 0259 12/29/16 1247  LATICACIDVEN 2.4* 1.82 1.3    ABG  Recent Labs Lab 12/29/16 1301  PHART 7.345*  PCO2ART 31.5*  PO2ART 60.0*    Liver Enzymes  Recent Labs Lab 01/08/2017 2016 12/25/16 0705 12/26/16 0201  12/29/16 0400 12/29/16 1901 12/30/16 0411  AST 24 23 30   --   --   --   --   ALT 15 13* 15  --   --   --   --   ALKPHOS 115 104 112  --   --   --   --   BILITOT 1.1 1.1 0.7  --   --   --   --   ALBUMIN 3.1* 2.9* 2.9*  < > 2.6* 2.6* 2.7*  < > = values in this interval not displayed.  Cardiac Enzymes  Recent Labs Lab 01/09/2017 2016 12/25/16 0156 12/25/16 0705  TROPONINI 0.06* 0.06* 0.06*    Glucose  Recent Labs Lab 12/29/16 0811 12/29/16 1216 12/29/16 1240 12/29/16 1625 12/29/16 2213 12/30/16 0759   GLUCAP 88 68 200* 117* 148* 114*    Imaging Ir Fluoro Guide Cv Line Left  Result Date: 12/29/2016 INDICATION: 71 year old female with cardiogenic shock and cardio renal syndrome. She is severely volume overload in requires temporary dialysis. EXAM: IR LEFT FLOURO GUIDE CV LINE; IR ULTRASOUND GUIDANCE VASC ACCESS LEFT MEDICATIONS: None ANESTHESIA/SEDATION: None FLUOROSCOPY TIME:  Fluoroscopy Time: 0 minutes 30 seconds (4 mGy). COMPLICATIONS: None immediate. PROCEDURE: Informed written consent was obtained from the patient after a thorough discussion of the procedural risks, benefits and alternatives. All questions were addressed. Maximal Sterile Barrier Technique was utilized including caps, mask, sterile gowns, sterile gloves, sterile drape, hand hygiene and skin antiseptic. A timeout was performed prior to the initiation of the procedure. The left internal jugular vein was interrogated with ultrasound and found to be widely patent. An image was obtained and stored for the medical record. Local anesthesia was attained by infiltration with 1% lidocaine. A small dermatotomy was made. Under real-time sonographic guidance, the vessel was punctured with a 21 gauge micropuncture needle. Using standard technique, the initial micro needle was exchanged over a 0.018 micro wire for a transitional 4 [place] micro sheath. The micro sheath was then exchanged over a 0.035 wire and the skin tract was dilated. A Covidien 24 cm Trialysis catheter was then advanced over the wire and position with the tip in the upper right atrium. The catheter was flushed with saline and locked with heparinized saline. A fluoroscopic spot image was obtained documenting catheter tip position. The catheter was then secured to the skin with 2 0 Prolene suture and a sterile bandage. The patient tolerated the procedure well. IMPRESSION: Successful placement of a left IJ approach Trialysis catheter. The catheter tips are in the upper right atrium  and ready for immediate use. Signed, Criselda Peaches, MD [facility] Radiology Electronically Signed   By: Jacqulynn Cadet M.D.   On: 12/29/2016 17:12   Ir US Guide Vasc Access Left  Result Date: 12/29/2016 INDICATION: 71 year old female with cardiogenic shock and cardio renal syndrome. She is severely volume overload in requires temporary dialysis. EXAM: IR LEFT FLOURO GUIDE CV LINE; IR ULTRASOUND GUIDANCE VASC ACCESS LEFT MEDICATIONS: None ANESTHESIA/SEDATION: None FLUOROSCOPY TIME:  Fluoroscopy Time: 0 minutes 30 seconds (4 mGy). COMPLICATIONS: None immediate. PROCEDURE: Informed written consent was obtained from the patient after a thorough discussion of  the procedural risks, benefits and alternatives. All questions were addressed. Maximal Sterile Barrier Technique was utilized including caps, mask, sterile gowns, sterile gloves, sterile drape, hand hygiene and skin antiseptic. A timeout was performed prior to the initiation of the procedure. The left internal jugular vein was interrogated with ultrasound and found to be widely patent. An image was obtained and stored for the medical record. Local anesthesia was attained by infiltration with 1% lidocaine. A small dermatotomy was made. Under real-time sonographic guidance, the vessel was punctured with a 21 gauge micropuncture needle. Using standard technique, the initial micro needle was exchanged over a 0.018 micro wire for a transitional 4 [place] micro sheath. The micro sheath was then exchanged over a 0.035 wire and the skin tract was dilated. A Covidien 24 cm Trialysis catheter was then advanced over the wire and position with the tip in the upper right atrium. The catheter was flushed with saline and locked with heparinized saline. A fluoroscopic spot image was obtained documenting catheter tip position. The catheter was then secured to the skin with 2 0 Prolene suture and a sterile bandage. The  patient tolerated the procedure well. IMPRESSION: Successful placement of a left IJ approach Trialysis catheter. The catheter tips are in the upper right atrium and ready for immediate use. Signed, Criselda Peaches, MD [facility] Radiology Electronically Signed   By: Jacqulynn Cadet M.D.   On: 12/29/2016 17:12   Dg Chest Port 1 View  Result Date: 12/29/2016 CLINICAL DATA:  71 year old female with history of hypoxia. EXAM: PORTABLE CHEST 1 VIEW COMPARISON:  Chest x-ray 12/27/2016. FINDINGS: There is a right-sided internal jugular central venous catheter with tip terminating in the right atrium. Left internal jugular Vas-Cath with tip terminating in the superior aspect of the right atrium. Lung volumes are low. No pneumothorax. Small left and moderate right pleural effusions, similar to the prior study. Bibasilar opacities may reflect areas of atelectasis and/or airspace consolidation. There is cephalization of the pulmonary vasculature and slight indistinctness of the interstitial markings suggestive of mild pulmonary edema. Mild cardiomegaly. Upper mediastinal contours are slightly distorted by patient's rotation on the right. Status post median sternotomy for CABG, including LIMA. IMPRESSION: 1. Support apparatus, as above. 2. The appearance of the chest suggests mild congestive heart failure, as above. 3. Moderate right and small left pleural effusions with dependent opacities which may reflect areas of atelectasis and/or airspace consolidation. Electronically Signed   By: Vinnie Langton M.D.   On: 12/29/2016 18:18     STUDIES:  Echo 3/13 > EF 15%, mild AS and AR, mild MR, mod TR. V/Q 3/14 > low prob  CULTURES: Blood 3/14 > Urine 3/12 > E coli > R to unasyn, S otherwise; ESBL negative  ANTIBIOTICS: Aztreonam 3/12 > 3/13; 3/14 >>  Doxy 3/12 > 3/13   SIGNIFICANT EVENTS: 3/12 > admit. 3/14 > new hypotension > transfer to ICU > started on  dobutamine. 3/17 >HD cath placed, CVVH started. Hypotension >Levophed started , BIPAP   LINES/TUBES: CVL 3/14 >  DISCUSSION: 71 y.o. female with hx CKD, combined heart failure, admitted 3/12 with oliguria and decompensated heart failure.  Developed hypotension early AM 3/14. E coli UTI.  Worsening renal failure with decompensated CHF , CVVHD started 3/17 .   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 Hypotension - likely due to above + BP meds + possible  sepsis due to UTI New onset A.Fib RVR - started on amio and heparin 3/13. Hx PVD, HTN, HLD, CAD s/p CABG 1993, AS. CVVH started 3/17  P:  - milrinone + norepi - follow SvO2 - anticoag per cardiology plans-angiomax started 3/18  - continue amiodarone - ASA, lipitor - CVVHD as per Renal - titrate pressors for MAP goal >65.    PULMONARY A: Acute hypoxic respiratory failure, likely due to pulm edema P:   - pulmonary hygiene,  - continue rx for her CHF -BIPAP as needed.   RENAL A:   AoCKD, progressive, consistent with ATN and cardiorenal syndrome; S Cr appears to be plateauing 3/16 Hyponatremia - due to above. P:   - CVVHD started 3/17 per Renal - follow UOP and BMP -for CRRT per renal     GASTROINTESTINAL A:   GERD. Nutrition. P:   - diet as ordered  HEMATOLOGIC A: Hemoconcentration.   VTE Prophylaxis. P:  - follow CBC  INFECTIOUS A:   UTI - E coli R to PCN's (no ESBL) Cellulitis of LE's - s/p treatment with doxy. P:   - Abx as ordered > aztreonam should be adequate coverage in absence ESBL, continue  - wound care following   ENDOCRINE A:   DM. Hypothyroidism. BS trending down , Levemir d/c 3/17  P:   - SSI - synthroid PO, note T4  NEUROLOGIC A:   Hx anxiety P:   - follow, on home xanax  Family updated: Pt updated; None at bedside 3/16  Interdisciplinary Family Meeting v Palliative Care  Meeting:  Due by: 01/01/17.  Tammy Parrett NP-C  [facility]  [phone]   12/30/2016

## 2016-12-30 NOTE — Progress Notes (Signed)
Patient ID: [name], female   DOB: Feb 14, 1946, 71 y.o.   MRN: [id number]     Advanced Heart Failure Rounding Note  Referring Physician: Dr Elsworth [name] Primary Physician:[name] Primary Cardiologist:  [name]  Reason for Consultation: Cardiogenic shock   Subjective:    Developed shock overnight 12/26/16. Central line and Art line placed. Started on dobutamine. AHF team consulted. Echo significant for EF 30% down to 15%.  Coox 68% on Milrinone 0.375 and levophed 10.  CVP remains > 20.  SBP 90s-100s.   Heparin gtt has been off with epistaxis, now resolved.   With very poor diuresis and rising creatinine, she was started on CVVH on 3/17.  Currently UF at 200 cc/hr.  Breathing is improving, she is comfortable currently.      Objective:   Weight Range: 188 lb 15 oz (85.7 kg) Body mass index is 33.47 kg/m.   Vital Signs:   Temp:  [95.7 F (35.4 C)-98.3 F (36.8 C)] 97.6 F (36.4 C) (03/18 0800) Pulse Rate:  [84-108] 102 (03/18 0844) Resp:  [0-25] 16 (03/18 0844) BP: (102-116)/(54-62) 103/56 (03/18 0844) SpO2:  [80 %-100 %] 97 % (03/18 0844) Arterial Line BP: (58-121)/(35-68) 98/57 (03/18 0700) FiO2 (%):  [50 %-100 %] 50 % (03/18 0400) Weight:  [188 lb 15 oz (85.7 kg)] 188 lb 15 oz (85.7 kg) (03/18 0500) Last BM Date: 12/29/16  Weight change: Filed Weights   12/26/16 0247 12/26/16 0430 12/30/16 0500  Weight: 158 lb 15.2 oz (72.1 kg) 175 lb 11.3 oz (79.7 kg) 188 lb 15 oz (85.7 kg)    Intake/Output:   Intake/Output Summary (Last 24 hours) at 12/30/16 0920 Last data filed at 12/30/16 0900  Gross per 24 hour  Intake          1408.48 ml  Output             3361 ml  Net         -1952.52 ml     Physical Exam: General:  Elderly and fatigued appearing. In bed, CVVH ongoing.    Neck: Thick. JVP 16+. No thyromegaly or nodule noted.  Cor: PMI nondisplaced. Mildly tachy, irregular. Rate normal. No S3/S4. 1/6 SEM RUSB.  Lungs: Crackles at bases bilaterally.   Abdomen:  Obese, distended, edematous. 1+ pitting. No hepatosplenomegaly. No bruits or masses. +BS Extremities: no cyanosis, clubbing, rash. 1+ edema to knees bilaterally. S/p L foot transmetatarsal amputation.  Neuro: alert & orientedx3, cranial nerves grossly intact. moves all 4 extremities w/o difficulty. Normal affect.   Telemetry: Personally reviewed, atrial fibrillation 90s-100s  Labs: CBC  Recent Labs  12/29/16 0358 12/30/16 0411  WBC 8.1 8.9  HGB 13.1 13.6  HCT 38.9 40.0  MCV 89.0 88.5  PLT 118* 970*   Basic Metabolic Panel  Recent Labs  12/29/16 1901 12/30/16 0411  NA 123* 126*  K 4.4 4.4  CL 92* 93*  CO2 18* 21*  GLUCOSE 141* 137*  BUN 84* 62*  CREATININE 3.20* 2.50*  CALCIUM 8.1* 8.6*  MG  --  2.3  PHOS 6.2* 4.5   Liver Function Tests  Recent Labs  12/29/16 1901 12/30/16 0411  ALBUMIN 2.6* 2.7*   No results for input(s): LIPASE, AMYLASE in the last 72 hours. Cardiac Enzymes No results for input(s): CKTOTAL, CKMB, CKMBINDEX, TROPONINI in the last 72 hours.  BNP: BNP (last 3 results)  Recent Labs  10/12/16 1026 12/10/16 1431 12/25/2016 2016  BNP 1,200.9* 1,134.8* 706.0*    ProBNP (last 3  results)  Recent Labs  12/10/16 1431  PROBNP 21,500*     D-Dimer No results for input(s): DDIMER in the last 72 hours. Hemoglobin A1C No results for input(s): HGBA1C in the last 72 hours. Fasting Lipid Panel No results for input(s): CHOL, HDL, LDLCALC, TRIG, CHOLHDL, LDLDIRECT in the last 72 hours. Thyroid Function Tests  Recent Labs  12/28/16 0556  T3FREE 0.8*    Other results:     Imaging/Studies:  Ir Fluoro Guide Cv Line Left  Result Date: 12/29/2016 INDICATION: 71 year old female with cardiogenic shock and cardio renal syndrome. She is severely volume overload in requires temporary dialysis. EXAM: IR LEFT FLOURO GUIDE CV LINE; IR ULTRASOUND GUIDANCE VASC ACCESS LEFT MEDICATIONS: None ANESTHESIA/SEDATION: None FLUOROSCOPY TIME:  Fluoroscopy  Time: 0 minutes 30 seconds (4 mGy). COMPLICATIONS: None immediate. PROCEDURE: Informed written consent was obtained from the patient after a thorough discussion of the procedural risks, benefits and alternatives. All questions were addressed. Maximal Sterile Barrier Technique was utilized including caps, mask, sterile gowns, sterile gloves, sterile drape, hand hygiene and skin antiseptic. A timeout was performed prior to the initiation of the procedure. The left internal jugular vein was interrogated with ultrasound and found to be widely patent. An image was obtained and stored for the medical record. Local anesthesia was attained by infiltration with 1% lidocaine. A small dermatotomy was made. Under real-time sonographic guidance, the vessel was punctured with a 21 gauge micropuncture needle. Using standard technique, the initial micro needle was exchanged over a 0.018 micro wire for a transitional 4 [place] micro sheath. The micro sheath was then exchanged over a 0.035 wire and the skin tract was dilated. A Covidien 24 cm Trialysis catheter was then advanced over the wire and position with the tip in the upper right atrium. The catheter was flushed with saline and locked with heparinized saline. A fluoroscopic spot image was obtained documenting catheter tip position. The catheter was then secured to the skin with 2 0 Prolene suture and a sterile bandage. The patient tolerated the procedure well. IMPRESSION: Successful placement of a left IJ approach Trialysis catheter. The catheter tips are in the upper right atrium and ready for immediate use. Signed, [name] Peaches, MD [facility] Radiology Electronically Signed   By: [name] M.D.   On: 12/29/2016 17:12   Ir US Guide Vasc Access Left  Result Date: 12/29/2016 INDICATION: 72 year old female with cardiogenic shock and cardio renal syndrome. She is severely volume overload in requires temporary  dialysis. EXAM: IR LEFT FLOURO GUIDE CV LINE; IR ULTRASOUND GUIDANCE VASC ACCESS LEFT MEDICATIONS: None ANESTHESIA/SEDATION: None FLUOROSCOPY TIME:  Fluoroscopy Time: 0 minutes 30 seconds (4 mGy). COMPLICATIONS: None immediate. PROCEDURE: Informed written consent was obtained from the patient after a thorough discussion of the procedural risks, benefits and alternatives. All questions were addressed. Maximal Sterile Barrier Technique was utilized including caps, mask, sterile gowns, sterile gloves, sterile drape, hand hygiene and skin antiseptic. A timeout was performed prior to the initiation of the procedure. The left internal jugular vein was interrogated with ultrasound and found to be widely patent. An image was obtained and stored for the medical record. Local anesthesia was attained by infiltration with 1% lidocaine. A small dermatotomy was made. Under real-time sonographic guidance, the vessel was punctured with a 21 gauge micropuncture needle. Using standard technique, the initial micro needle was exchanged over a 0.018 micro wire for a transitional 4 [place] micro sheath. The micro sheath was then exchanged over a  0.035 wire and the skin tract was dilated. A Covidien 24 cm Trialysis catheter was then advanced over the wire and position with the tip in the upper right atrium. The catheter was flushed with saline and locked with heparinized saline. A fluoroscopic spot image was obtained documenting catheter tip position. The catheter was then secured to the skin with 2 0 Prolene suture and a sterile bandage. The patient tolerated the procedure well. IMPRESSION: Successful placement of a left IJ approach Trialysis catheter. The catheter tips are in the upper right atrium and ready for immediate use. Signed, [name] Peaches, MD [facility] Radiology Electronically Signed   By: [name] M.D.   On: 12/29/2016 17:12   Dg Chest Port 1 View  Result  Date: 12/29/2016 CLINICAL DATA:  71 year old female with history of hypoxia. EXAM: PORTABLE CHEST 1 VIEW COMPARISON:  Chest x-ray 12/27/2016. FINDINGS: There is a right-sided internal jugular central venous catheter with tip terminating in the right atrium. Left internal jugular Vas-Cath with tip terminating in the superior aspect of the right atrium. Lung volumes are low. No pneumothorax. Small left and moderate right pleural effusions, similar to the prior study. Bibasilar opacities may reflect areas of atelectasis and/or airspace consolidation. There is cephalization of the pulmonary vasculature and slight indistinctness of the interstitial markings suggestive of mild pulmonary edema. Mild cardiomegaly. Upper mediastinal contours are slightly distorted by patient's rotation on the right. Status post median sternotomy for CABG, including LIMA. IMPRESSION: 1. Support apparatus, as above. 2. The appearance of the chest suggests mild congestive heart failure, as above. 3. Moderate right and small left pleural effusions with dependent opacities which may reflect areas of atelectasis and/or airspace consolidation. Electronically Signed   By: [name] M.D.   On: 12/29/2016 18:18      Medications:     Scheduled Medications: . aspirin EC  81 mg Oral Daily  . atorvastatin  40 mg Oral QHS  . Chlorhexidine Gluconate Cloth  6 each Topical Daily  . Gerhardt's butt cream   Topical BID  . guaiFENesin  600 mg Oral BID  . insulin aspart  0-15 Units Subcutaneous TID WC  . levothyroxine  112.5 mcg Oral QAC breakfast  . mouth rinse  15 mL Mouth Rinse QID  . senna-docusate  1 tablet Oral Daily  . sodium chloride flush  10-40 mL Intracatheter Q12H  . sodium chloride flush  3 mL Intravenous Q12H    Infusions: . amiodarone 30 mg/hr (12/30/16 0800)  . heparin Stopped (12/28/16 1645)  . milrinone 0.375 mcg/kg/min (12/30/16 0800)  . norepinephrine (LEVOPHED) Adult infusion 10 mcg/min (12/30/16 0800)  .  dialysis replacement fluid (prismasate) 400 mL/hr at 12/30/16 0727  . dialysis replacement fluid (prismasate) 200 mL/hr at 12/29/16 1852  . dialysate (PRISMASATE) 1,500 mL/hr at 12/30/16 0825    PRN Medications: Place/Maintain arterial line **AND** sodium chloride, acetaminophen (TYLENOL) oral liquid 160 mg/5 mL, ALPRAZolam, guaiFENesin-dextromethorphan, heparin, heparin, hydrOXYzine, levalbuterol, ondansetron **OR** ondansetron (ZOFRAN) IV, sodium chloride, sodium chloride flush   Assessment/Plan   [name] is a 71 y.o. female CAD s/p CABG, chronic combined CHF, moderate aortic stenosis, DM, HLD, PVD followed by [name]. Donnetta [name] s/p L foot transmetatarsal amputation, hypothyroidism, syncope, CKD stage III-IV, and Iron/B12 deficiency.  Admitted for edema and poor urine output.  Found to be hypotensive and in cardiogenic shock. Also found to be in [place] Afib. ? Infection origin/early + UA, UCx with E. Coli and CXR concerning for PNA.  1. Acute on chronic biventricular CHF: Ischemic cardiomyopathy.  Echo with admission with EF 15%, severely decreased RV systolic function, mid to moderate aortic stenosis.  CVP > 20 today.  Pt remains markedly volume overloaded on exam. Co-ox 68%. Cardiac output appears adequate on milrinone 0.375 mcg/kg/min and levophed 10.  She has diuresed poorly despite high dose diuretics, creatinine elevated.  Therefore, CVVH started on 3/17.  UF now at 200 cc/hr.  Breathing is improved.  - Continue CVVH per renal.  Has considerably more fluid to remove.  BP is tolerating so far.  - She does not have ICD. Will eventually need consideration with long standing low EF if she recovers from this hospitalization. Unstable currently and with chronic leg wounds so this would be down the road.  - Not good candidate for advanced therapies with RV failure, renal failure, and limited mobility. 2. Atrial fibrillation versus atypical flutter: Remains in Afib but rate stable on amio gtt.  -  Continue amio gtt for now.   - Plan eventual TEE/DCCV if she stabilizes.  - Will need anticoag with CHA2DS2/VASc of 6.  Heparin currently on hold with recurrent epistaxis and some hematuria.  This has resolved, I will start bivalirudin gtt (falling plts) this morning. 3. AKI on CKD stage 3: Baseline creatinine 1.4-1.6.   Suspect component of cardiorenal syndrome but also ATN with hypotensive episode initially.  She is now on CVVH per nephrology to see if volume unloading will help her renal function.  She is not a good long-term HD candidate.  4. CAD: No chest pain. Last cath in 12/12 with patent LIMA to LAD with occlusion of distal LAD; High grade stenosis of nondominant RCA; Signif disease of prox LCx; SVG to L PDA occluded, SVG to OM patent).  Attempted PTCA of proximal LCx but failed 10/2011. No good  revascularization options.   - Continue ASA 81 and statin for now.  5. PAD: Extensive, especially in the left leg.  She is s/p left transmetatarsal amputation.  This has led to her limited mobility. Followed by [facility].  - No change to current plan.  6. UTI: E coli. Has completed aztreonam course.    7. Right pleural effusion:  May need to consider thoracentesis eventually.   8. Aortic stenosis: Mild to possibly moderate on echo. No change to current plan.  9. Epistaxis: Resolved, to start on bivalirudin gtt (low plts).  Keep O2 humidified.    10. Hyponatremia: Fluid restrict 1500 cc.  11. Thrombocytopenia: Platelets falling, she was on heparin here.  Will send HIT antibody and start on bivalirudin for afib rather than heparin gtt.    Guarded prognosis.  She asked yesterday to remain full code.   Length of Stay: 6  [name] Champagne, MD  12/30/2016, 9:20 AM  Advanced Heart Failure Team Pager [phone] (M-F; 7a - 4p)  Please contact [facility] for night-coverage after hours (4p -7a ) and weekends on [email or web address]

## 2016-12-30 NOTE — Progress Notes (Signed)
ANTICOAGULATION CONSULT NOTE - Initial Consult  Pharmacy Consult for Bivalirudin Indication: atrial fibrillation/decrease in PLT/also on CRRT  Patient Measurements: Height: 5\' 3"  (160 cm) Weight: 188 lb 15 oz (85.7 kg) IBW/kg (Calculated) : 52.4 Heparin Dosing Weight: ~70kg  Vital Signs: Temp: 98 F (36.7 C) (03/18 1600) Temp Source: Axillary (03/18 1600) BP: 103/56 (03/18 0844) Pulse Rate: 109 (03/18 1900)  Labs:  Recent Labs  12/28/16 0602  12/29/16 0358  12/29/16 1144 12/29/16 1901 12/30/16 0411 12/30/16 1324 12/30/16 1526 12/30/16 1753  HGB 13.6  --  13.1  --   --   --  13.6  --   --   --   HCT 41.4  --  38.9  --   --   --  40.0  --   --   --   PLT 127*  --  118*  --   --   --  102*  --   --   --   APTT  --   --   --   --   --   --   --  78*  --  79*  LABPROT  --   --   --   --  15.2  --   --   --   --   --   INR  --   --   --   --  1.20  --   --   --   --   --   CREATININE  --   < >  --   < >  --  3.20* 2.50*  --  1.82*  --   < > = values in this interval not displayed.  Estimated Creatinine Clearance: 29.4 mL/min (A) (by C-G formula based on SCr of 1.82 mg/dL (H)).  Medications: Heparin on hold  Assessment: 70yof previously on heparin for afib with CHA2DS2/VASc of 6.  Heparin was on hold on 3/15 and 3/17 with recurrent epistaxis and hematuria. Per Dr. Aundra Dubin, will resume anticoagulation today. Patient with recent decrease in platelets ~50% from last month. Calculated 4T score ~4. HIT antibody sent 3/18.  Of note, patient was also started on CRRT 3/17.  aPTT this evening is slightly SUPRAtherapeutic (aPTT 79, goal of 50-70 secs). Bival currently running at 0.04 mg/kg/hr. Will reduce by 20% and recheck 2hr aPTT.   Goal of Therapy:  aPTT 50-85 seconds (Pre Dr. Aundra Dubin keep it on lower end of goal due to issues with bleeding: 50-70) Monitor platelets by anticoagulation protocol: Yes   Plan:  1. Decrease Bivalirudin to 0.032 mg/kg/min 2. aPTT every 2 hours until  therapeutic x 2 3. Daily aPTT and CBC  Thank you for allowing pharmacy to be a part of this patient's care.  Alycia Rossetti, PharmD, BCPS Clinical Pharmacist Pager: [phone] 12/30/2016 7:13 PM

## 2016-12-30 NOTE — Progress Notes (Addendum)
[place] for Bivalirudin Indication: atrial fibrillation/decrease in PLT/also on CRRT  Allergies  Allergen Reactions  . Cephalexin Swelling and Rash  . Ciprofloxacin Swelling and Rash  . Zyvox [Linezolid] Other (See Comments)    Thrombocytopenia developed to 50k after several weeks of therapy  . Adhesive [Tape] Other (See Comments)    "old [employer] adhesive tape; takes my skin off; can use paper tape"  . Bactrim [Sulfamethoxazole-Trimethoprim] Other (See Comments)    Hyperkalemia (elevated potassium) and Increased creatinine  . Daptomycin Other (See Comments)    Had elevated CPK on therapy, not clear that this was due to cubicin  . Vancomycin Other (See Comments)    ? If this played role in her Acute kidney injury    Patient Measurements: Height: 5\' 3"  (160 cm) Weight: 188 lb 15 oz (85.7 kg) IBW/kg (Calculated) : 52.4 Heparin Dosing Weight: ~70kg  Vital Signs: Temp: 98.2 F (36.8 C) (03/18 2000) Temp Source: Axillary (03/18 2000) Pulse Rate: 113 (03/18 2300)  Labs:  Recent Labs  12/28/16 0602  12/29/16 0358  12/29/16 1144 12/29/16 1901 12/30/16 0411 12/30/16 1324 12/30/16 1526 12/30/16 1753 12/30/16 2231  HGB 13.6  --  13.1  --   --   --  13.6  --   --   --   --   HCT 41.4  --  38.9  --   --   --  40.0  --   --   --   --   PLT 127*  --  118*  --   --   --  102*  --   --   --   --   APTT  --   --   --   --   --   --   --  78*  --  79* 76*  LABPROT  --   --   --   --  15.2  --   --   --   --   --   --   INR  --   --   --   --  1.20  --   --   --   --   --   --   CREATININE  --   < >  --   < >  --  3.20* 2.50*  --  1.82*  --   --   < > = values in this interval not displayed.  Estimated Creatinine Clearance: 29.4 mL/min (A) (by C-G formula based on SCr of 1.82 mg/dL (H)).  Assessment: 71 yo female with afib for bivalirudin Goal of Therapy:  Heparin level 0.3-0.7 units/ml aPTT 50-85 seconds (Per Dr. Aundra Dubin keep it on  lower end of goal due to issues with bleeding: 50-70) Monitor platelets by anticoagulation protocol: Yes   Plan:  Decrease bivalirudin 0.02 mg/kg/min APTT with am labs  Phillis Knack, PharmD, BCPS  12/30/16 11:24 PM   Addendum: Repeat aPTT 66 this morning.  Will continue Angiomax at current rate.  Recheck aPTT late this morning to verify  Phillis Knack, PharmD, BCPS 12/31/2016 5:09 AM

## 2016-12-30 NOTE — Progress Notes (Signed)
ANTICOAGULATION CONSULT NOTE - Initial Consult  Pharmacy Consult for Bivalirudin Indication: atrial fibrillation/decrease in PLT/also on CRRT  Allergies  Allergen Reactions  . Cephalexin Swelling and Rash  . Ciprofloxacin Swelling and Rash  . Zyvox [Linezolid] Other (See Comments)    Thrombocytopenia developed to 50k after several weeks of therapy  . Adhesive [Tape] Other (See Comments)    "old [employer]Johnson adhesive tape; takes my skin off; can use paper tape"  . Bactrim [Sulfamethoxazole-Trimethoprim] Other (See Comments)    Hyperkalemia (elevated potassium) and Increased creatinine  . Daptomycin Other (See Comments)    Had elevated CPK on therapy, not clear that this was due to cubicin  . Vancomycin Other (See Comments)    ? If this played role in her Acute kidney injury    Patient Measurements: Height: 5\' 3"  (160 cm) Weight: 188 lb 15 oz (85.7 kg) IBW/kg (Calculated) : 52.4 Heparin Dosing Weight: ~70kg  Vital Signs: Temp: 97.6 F (36.4 C) (03/18 0800) Temp Source: Axillary (03/18 0800) BP: 103/56 (03/18 0844) Pulse Rate: 105 (03/18 1400)  Labs:  Recent Labs  12/28/16 0602  12/29/16 0358 12/29/16 0400 12/29/16 1144 12/29/16 1901 12/30/16 0411 12/30/16 1324  HGB 13.6  --  13.1  --   --   --  13.6  --   HCT 41.4  --  38.9  --   --   --  40.0  --   PLT 127*  --  118*  --   --   --  102*  --   APTT  --   --   --   --   --   --   --  78*  LABPROT  --   --   --   --  15.2  --   --   --   INR  --   --   --   --  1.20  --   --   --   CREATININE  --   < >  --  3.07*  --  3.20* 2.50*  --   < > = values in this interval not displayed.  Estimated Creatinine Clearance: 21.4 mL/min (A) (by C-G formula based on SCr of 2.5 mg/dL (H)).  Medications: Heparin on hold  Assessment: 70yof previously on heparin for afib with CHA2DS2/VASc of 6.  Heparin was on hold on 3/15 and 3/17 with recurrent epistaxis and hematuria. Per Dr. Aundra Dubin, will resume anticoagulation today.  Patient with recent decrease in platelets ~50% from last month. Calculated 4T score ~4. HIT antibody sent 3/18.   Of note, patient was also started on CRRT 3/17.  Goal of Therapy:  Heparin level 0.3-0.7 units/ml aPTT 50-85 seconds (Pre Dr. Aundra Dubin keep it on lower end of goal due to issues with bleeding: 50-70) Monitor platelets by anticoagulation protocol: Yes   Plan:  1) Start bivalirudin 0.05mg /kg/hr 2) aPTT every 2 hours until therapeutic*2 3) Daily aPTT and CBC  Cruz Condon, PharmD   Addendum: 2 hour aPTT back at 78. Will decrease dose by 20% and follow up in 2 hours to assess need for further adjustment.  1) Decrease bivalirudin to 0.04mg /kg/hr 2) aPTT every 2 hours until therapeutic*2, then daily aPTT  Melburn Popper, PharmD Clinical Pharmacy Resident Pager: [phone] 12/30/16 2:46 PM

## 2016-12-30 NOTE — Progress Notes (Signed)
Patient ID: [name], female   DOB: December 22, 1945, 71 y.o.   MRN: [id number]  [facility] Progress Note   Assessment/ Plan:   1. Acute kidney injury on chronic kidney disease stage III: Suspect cardiorenal syndrome with superimposed ATN with relative hypotension/cardiogenic shock. I started her on CRRT yesterday after refractoriness to diuretics/inotropic support. Some net negative ultrafiltration noted overnight but CVP remains elevated-will continue this and review response. 2. Acute exacerbation of congestive heart failure: History of ischemic cardiomyopathy with EF of 15%. Started on CRRT after failed response to diuretic/inotropes. 3. Hyponatremia: Secondary to CHF exacerbation and acute on chronic renal insufficiency- somewhat better with ultrafiltration. 4. New onset atrial fibrillation/flutter: Remains rate controlled and currently on amiodarone drip.  Subjective:   Underwent left IJ dialysis catheter placement yesterday and now on CRRT.    Objective:   BP (!) 103/56   Pulse (!) 102   Temp 97.6 F (36.4 C) (Axillary)   Resp 16   Ht 5\' 3"  (1.6 m)   Wt 85.7 kg (188 lb 15 oz)   SpO2 97%   BMI 33.47 kg/m   Intake/Output Summary (Last 24 hours) at 12/30/16 1018 Last data filed at 12/30/16 1000  Gross per 24 hour  Intake          1421.48 ml  Output             3659 ml  Net         -2237.52 ml   Weight change:   Physical Exam: Gen: Appears to be uncomfortable resting in bed with intermittent left leg pain CVS: Pulse irregularly irregular tachycardia, 3/6 holosystolic murmur, elevated JVD Resp: Coarse breath sounds anteriorly Abd: Soft, obese, nontender Ext: 2+ lower extremity edema with Ace wrap over her extremities   Imaging: Ir Fluoro Guide Cv Line Left  Result Date: 12/29/2016 INDICATION: 71 year old female with cardiogenic shock and cardio renal syndrome. She is severely volume overload in requires temporary dialysis. EXAM: IR LEFT FLOURO GUIDE  CV LINE; IR ULTRASOUND GUIDANCE VASC ACCESS LEFT MEDICATIONS: None ANESTHESIA/SEDATION: None FLUOROSCOPY TIME:  Fluoroscopy Time: 0 minutes 30 seconds (4 mGy). COMPLICATIONS: None immediate. PROCEDURE: Informed written consent was obtained from the patient after a thorough discussion of the procedural risks, benefits and alternatives. All questions were addressed. Maximal Sterile Barrier Technique was utilized including caps, mask, sterile gowns, sterile gloves, sterile drape, hand hygiene and skin antiseptic. A timeout was performed prior to the initiation of the procedure. The left internal jugular vein was interrogated with ultrasound and found to be widely patent. An image was obtained and stored for the medical record. Local anesthesia was attained by infiltration with 1% lidocaine. A small dermatotomy was made. Under real-time sonographic guidance, the vessel was punctured with a 21 gauge micropuncture needle. Using standard technique, the initial micro needle was exchanged over a 0.018 micro wire for a transitional 4 [place] micro sheath. The micro sheath was then exchanged over a 0.035 wire and the skin tract was dilated. A Covidien 24 cm Trialysis catheter was then advanced over the wire and position with the tip in the upper right atrium. The catheter was flushed with saline and locked with heparinized saline. A fluoroscopic spot image was obtained documenting catheter tip position. The catheter was then secured to the skin with 2 0 Prolene suture and a sterile bandage. The patient tolerated the procedure well. IMPRESSION: Successful placement of a left IJ approach Trialysis catheter. The catheter tips are in the upper right atrium and  ready for immediate use. Signed, Criselda Peaches, MD [facility] Radiology Electronically Signed   By: Jacqulynn Cadet M.D.   On: 12/29/2016 17:12   Ir US Guide Vasc Access Left  Result Date: 12/29/2016 INDICATION:  71 year old female with cardiogenic shock and cardio renal syndrome. She is severely volume overload in requires temporary dialysis. EXAM: IR LEFT FLOURO GUIDE CV LINE; IR ULTRASOUND GUIDANCE VASC ACCESS LEFT MEDICATIONS: None ANESTHESIA/SEDATION: None FLUOROSCOPY TIME:  Fluoroscopy Time: 0 minutes 30 seconds (4 mGy). COMPLICATIONS: None immediate. PROCEDURE: Informed written consent was obtained from the patient after a thorough discussion of the procedural risks, benefits and alternatives. All questions were addressed. Maximal Sterile Barrier Technique was utilized including caps, mask, sterile gowns, sterile gloves, sterile drape, hand hygiene and skin antiseptic. A timeout was performed prior to the initiation of the procedure. The left internal jugular vein was interrogated with ultrasound and found to be widely patent. An image was obtained and stored for the medical record. Local anesthesia was attained by infiltration with 1% lidocaine. A small dermatotomy was made. Under real-time sonographic guidance, the vessel was punctured with a 21 gauge micropuncture needle. Using standard technique, the initial micro needle was exchanged over a 0.018 micro wire for a transitional 4 [place] micro sheath. The micro sheath was then exchanged over a 0.035 wire and the skin tract was dilated. A Covidien 24 cm Trialysis catheter was then advanced over the wire and position with the tip in the upper right atrium. The catheter was flushed with saline and locked with heparinized saline. A fluoroscopic spot image was obtained documenting catheter tip position. The catheter was then secured to the skin with 2 0 Prolene suture and a sterile bandage. The patient tolerated the procedure well. IMPRESSION: Successful placement of a left IJ approach Trialysis catheter. The catheter tips are in the upper right atrium and ready for immediate use. Signed, Criselda Peaches, MD [facility]  [facility] Radiology Electronically Signed   By: Jacqulynn Cadet M.D.   On: 12/29/2016 17:12   Dg Chest Port 1 View  Result Date: 12/29/2016 CLINICAL DATA:  71 year old female with history of hypoxia. EXAM: PORTABLE CHEST 1 VIEW COMPARISON:  Chest x-ray 12/27/2016. FINDINGS: There is a right-sided internal jugular central venous catheter with tip terminating in the right atrium. Left internal jugular Vas-Cath with tip terminating in the superior aspect of the right atrium. Lung volumes are low. No pneumothorax. Small left and moderate right pleural effusions, similar to the prior study. Bibasilar opacities may reflect areas of atelectasis and/or airspace consolidation. There is cephalization of the pulmonary vasculature and slight indistinctness of the interstitial markings suggestive of mild pulmonary edema. Mild cardiomegaly. Upper mediastinal contours are slightly distorted by patient's rotation on the right. Status post median sternotomy for CABG, including LIMA. IMPRESSION: 1. Support apparatus, as above. 2. The appearance of the chest suggests mild congestive heart failure, as above. 3. Moderate right and small left pleural effusions with dependent opacities which may reflect areas of atelectasis and/or airspace consolidation. Electronically Signed   By: Vinnie Langton M.D.   On: 12/29/2016 18:18    Labs: BMET  Recent Labs Lab 12/27/16 0500 12/27/16 1400 12/28/16 0556 12/28/16 1753 12/29/16 0400 12/29/16 1901 12/30/16 0411  NA 128* 128* 126* 126* 125* 123* 126*  K 4.9 4.6 4.3 4.3 4.3 4.4 4.4  CL 98* 96* 96* 94* 93* 92* 93*  CO2 20* 21* 21* 20* 20* 18* 21*  GLUCOSE  211* 257* 176* 163* 74 141* 137*  BUN 83* 84* 86* 85* 88* 84* 62*  CREATININE 2.77* 2.85* 2.97* 3.06* 3.07* 3.20* 2.50*  CALCIUM 8.8* 8.7* 8.7* 8.8* 8.7* 8.1* 8.6*  PHOS 5.7* 5.6* 5.4* 5.5* 5.4* 6.2* 4.5   CBC  Recent Labs Lab 12/26/16 0201 12/27/16 0500 12/28/16 0602 12/29/16 0358 12/30/16 0411  WBC 7.2 6.1  7.6 8.1 8.9  NEUTROABS 5.5  --   --   --   --   HGB 15.4* 13.5 13.6 13.1 13.6  HCT 47.3* 41.1 41.4 38.9 40.0  MCV 92.9 91.1 91.0 89.0 88.5  PLT 150 126* 127* 118* 102*   Medications:    . aspirin EC  81 mg Oral Daily  . atorvastatin  40 mg Oral QHS  . Chlorhexidine Gluconate Cloth  6 each Topical Daily  . Gerhardt's butt cream   Topical BID  . guaiFENesin  600 mg Oral BID  . insulin aspart  0-15 Units Subcutaneous TID WC  . levothyroxine  112.5 mcg Oral QAC breakfast  . mouth rinse  15 mL Mouth Rinse QID  . senna-docusate  1 tablet Oral Daily  . sodium chloride flush  10-40 mL Intracatheter Q12H  . sodium chloride flush  3 mL Intravenous Q12H   Elmarie Shiley, MD 12/30/2016, 10:18 AM

## 2016-12-31 ENCOUNTER — Inpatient Hospital Stay (HOSPITAL_COMMUNITY): Payer: Medicare Other

## 2016-12-31 DIAGNOSIS — N179 Acute kidney failure, unspecified: Secondary | ICD-10-CM

## 2016-12-31 DIAGNOSIS — N182 Chronic kidney disease, stage 2 (mild): Secondary | ICD-10-CM

## 2016-12-31 DIAGNOSIS — Z515 Encounter for palliative care: Secondary | ICD-10-CM

## 2016-12-31 LAB — RENAL FUNCTION PANEL
Albumin: 2.6 g/dL — ABNORMAL LOW (ref 3.5–5.0)
Albumin: 2.5 g/dL — ABNORMAL LOW (ref 3.5–5.0)
Anion gap: 13 (ref 5–15)
Anion gap: 10 (ref 5–15)
BUN: 31 mg/dL — ABNORMAL HIGH (ref 6–20)
BUN: 22 mg/dL — AB (ref 6–20)
CALCIUM: 8.6 mg/dL — AB (ref 8.9–10.3)
CHLORIDE: 101 mmol/L (ref 101–111)
CO2: 19 mmol/L — AB (ref 22–32)
CO2: 22 mmol/L (ref 22–32)
CREATININE: 1.91 mg/dL — AB (ref 0.44–1.00)
CREATININE: 1.69 mg/dL — AB (ref 0.44–1.00)
Calcium: 8.7 mg/dL — ABNORMAL LOW (ref 8.9–10.3)
Chloride: 99 mmol/L — ABNORMAL LOW (ref 101–111)
GFR calc Af Amer: 34 mL/min — ABNORMAL LOW (ref >60)
GFR calc non Af Amer: 25 mL/min — ABNORMAL LOW (ref >60)
GFR, EST AFRICAN AMERICAN: 29 mL/min — AB (ref >60)
GFR, EST NON AFRICAN AMERICAN: 29 mL/min — AB (ref >60)
GLUCOSE: 176 mg/dL — AB (ref 65–99)
Glucose, Bld: 173 mg/dL — ABNORMAL HIGH (ref 65–99)
POTASSIUM: 4.3 mmol/L (ref 3.5–5.1)
Phosphorus: 3.1 mg/dL (ref 2.5–4.6)
Phosphorus: 2.4 mg/dL — ABNORMAL LOW (ref 2.5–4.6)
Potassium: 4.7 mmol/L (ref 3.5–5.1)
SODIUM: 131 mmol/L — AB (ref 135–145)
Sodium: 133 mmol/L — ABNORMAL LOW (ref 135–145)

## 2016-12-31 LAB — CBC
HCT: 40.4 % (ref 36.0–46.0)
Hemoglobin: 13.4 g/dL (ref 12.0–15.0)
MCH: 29.9 pg (ref 26.0–34.0)
MCHC: 33.2 g/dL (ref 30.0–36.0)
MCV: 90.2 fL (ref 78.0–100.0)
PLATELETS: 85 10*3/uL — AB (ref 150–400)
RBC: 4.48 MIL/uL (ref 3.87–5.11)
RDW: 16.8 % — AB (ref 11.5–15.5)
WBC: 9.8 10*3/uL (ref 4.0–10.5)

## 2016-12-31 LAB — GLUCOSE, CAPILLARY
GLUCOSE-CAPILLARY: 185 mg/dL — AB (ref 65–99)
GLUCOSE-CAPILLARY: 180 mg/dL — AB (ref 65–99)
GLUCOSE-CAPILLARY: 159 mg/dL — AB (ref 65–99)
Glucose-Capillary: 197 mg/dL — ABNORMAL HIGH (ref 65–99)

## 2016-12-31 LAB — COOXEMETRY PANEL
Carboxyhemoglobin: 1.4 % (ref 0.5–1.5)
METHEMOGLOBIN: 1 % (ref 0.0–1.5)
O2 SAT: 85.9 %
TOTAL HEMOGLOBIN: 13.6 g/dL (ref 12.0–16.0)

## 2016-12-31 LAB — CULTURE, BLOOD (ROUTINE X 2)
Culture: NO GROWTH
Culture: NO GROWTH

## 2016-12-31 LAB — APTT
APTT: 68 s — AB (ref 24–36)
aPTT: 66 seconds — ABNORMAL HIGH (ref 24–36)

## 2016-12-31 LAB — MAGNESIUM: MAGNESIUM: 2.4 mg/dL (ref 1.7–2.4)

## 2016-12-31 MED ORDER — FENTANYL CITRATE (PF) 100 MCG/2ML IJ SOLN
50.0000 ug | INTRAMUSCULAR | Status: DC | PRN
  Administered 2016-12-31 (×2): 25 ug via INTRAVENOUS
  Administered 2017-01-01 (×2): 50 ug via INTRAVENOUS
  Administered 2017-01-02: 25 ug via INTRAVENOUS
  Administered 2017-01-02: 50 ug via INTRAVENOUS
  Administered 2017-01-03: 25 ug via INTRAVENOUS
  Administered 2017-01-03: 50 ug via INTRAVENOUS
  Filled 2016-12-31 (×10): qty 2

## 2016-12-31 MED ORDER — FENTANYL CITRATE (PF) 100 MCG/2ML IJ SOLN
INTRAMUSCULAR | Status: AC
Start: 2016-12-31 — End: 2016-12-31
  Filled 2016-12-31: qty 2

## 2016-12-31 NOTE — Progress Notes (Signed)
Patient ID: [name], female   DOB: 01/15/1946, 71 y.o.   MRN: [id number]     Advanced Heart Failure Rounding Note  Referring Physician: Dr [name] Primary Physician:Dr. Carlota [name] Primary Cardiologist:  Dr. Harrington [name]  Reason for Consultation: Cardiogenic shock   Subjective:    Developed shock overnight 12/26/16. Central line and Art line placed. Started on dobutamine. AHF team consulted. Echo significant for EF 30% down to 15%.  Coox 86% on Milrinone 0.375 and levophed 15.  CVP remains 18. SBP 90s-100s.   Yesterday heparin stopped due to thrombocytopenia, HIT antibody sent and pending. Bival started.    With very poor diuresis and rising creatinine, she was started on CVVH on 3/17.  Currently UF at 150  cc/hr.  ~30 cc urine output 24 hours.   Denies SOB. Complaining of fatigue.  Complaining of R and LLE pain.   Objective:   Weight Range: 188 lb 15 oz (85.7 kg) Body mass index is 33.47 kg/m.   Vital Signs:   Temp:  [97.6 F (36.4 C)-98.2 F (36.8 C)] 98.2 F (36.8 C) (03/19 0400) Pulse Rate:  [102-114] 108 (03/19 0700) Resp:  [0-22] 0 (03/19 0700) BP: (103)/(56) 103/56 (03/18 0844) SpO2:  [92 %-99 %] 97 % (03/19 0700) Arterial Line BP: (89-121)/(46-60) 111/55 (03/19 0700) Last BM Date: 12/29/16  Weight change: Filed Weights   12/26/16 0247 12/26/16 0430 12/30/16 0500  Weight: 158 lb 15.2 oz (72.1 kg) 175 lb 11.3 oz (79.7 kg) 188 lb 15 oz (85.7 kg)    Intake/Output:   Intake/Output Summary (Last 24 hours) at 12/31/16 0714 Last data filed at 12/31/16 0700  Gross per 24 hour  Intake          1329.52 ml  Output             5438 ml  Net         -4108.48 ml     Physical Exam: CVP 18  General:  Elderly and fatigued appearing. In bed, CVVH ongoing.    Neck: Thick. JVP to ear  No thyromegaly or nodule noted.  Cor: PMI nondisplaced. Mildly tachy, irregular. Rate normal. No S3/S4. 1/6 SEM RUSB.  Lungs: Crackles at bases bilaterally.   Abdomen: Obese, distended,  edematous. 1+ pitting. No hepatosplenomegaly. No bruits or masses. +BS Extremities: no cyanosis, clubbing, rash. R and LLE 2-3 edema to knees bilaterally. S/p L foot transmetatarsal amputation. R and LLE ace wraps. R and L Knee with foam dressing  Neuro: alert & orientedx3, cranial nerves grossly intact. moves all 4 extremities w/o difficulty. Normal affect.   Telemetry: Personally reviewed, atrial fibrillation 90s-120s  Labs: CBC  Recent Labs  12/30/16 0411 12/31/16 0411  WBC 8.9 9.8  HGB 13.6 13.4  HCT 40.0 40.4  MCV 88.5 90.2  PLT 102* 85*   Basic Metabolic Panel  Recent Labs  12/30/16 0411 12/30/16 1526 12/31/16 0411  NA 126* 130* 131*  K 4.4 4.1 4.7  CL 93* 100* 99*  CO2 21* 19* 19*  GLUCOSE 137* 155* 176*  BUN 62* 40* 31*  CREATININE 2.50* 1.82* 1.91*  CALCIUM 8.6* 7.5* 8.6*  MG 2.3  --  2.4  PHOS 4.5 3.0 3.1   Liver Function Tests  Recent Labs  12/30/16 1526 12/31/16 0411  ALBUMIN 2.3* 2.6*   No results for input(s): LIPASE, AMYLASE in the last 72 hours. Cardiac Enzymes No results for input(s): CKTOTAL, CKMB, CKMBINDEX, TROPONINI in the last 72 hours.  BNP: BNP (last 3 results)  Recent Labs  10/12/16 1026 12/10/16 1431 01/04/2017 2016  BNP 1,200.9* 1,134.8* 706.0*    ProBNP (last 3 results)  Recent Labs  12/10/16 1431  PROBNP 21,500*     D-Dimer No results for input(s): DDIMER in the last 72 hours. Hemoglobin A1C No results for input(s): HGBA1C in the last 72 hours. Fasting Lipid Panel No results for input(s): CHOL, HDL, LDLCALC, TRIG, CHOLHDL, LDLDIRECT in the last 72 hours. Thyroid Function Tests No results for input(s): TSH, T4TOTAL, T3FREE, THYROIDAB in the last 72 hours.  Invalid input(s): FREET3  Other results:     Imaging/Studies:  Dg Chest Port 1 View  Result Date: 12/31/2016 CLINICAL DATA:  Respiratory failure . EXAM: PORTABLE CHEST 1 VIEW COMPARISON:  12/29/2016 . FINDINGS: Bilateral IJ lines in stable position.  Prior CABG. Diffuse bilateral pulmonary infiltrates/edema and bilateral pleural effusions. Slight worsening from prior exam. No pneumothorax. IMPRESSION: 1.  Bilateral IJ lines in stable position. 2. Prior CABG. Diffuse bilateral pulmonary infiltrates and bilateral pleural effusions. Slight worsening from prior exam. Electronically Signed   By: [name]  Register   On: 12/31/2016 06:59      Medications:     Scheduled Medications: . aspirin EC  81 mg Oral Daily  . atorvastatin  40 mg Oral QHS  . Chlorhexidine Gluconate Cloth  6 each Topical Daily  . Gerhardt's butt cream   Topical BID  . guaiFENesin  600 mg Oral BID  . insulin aspart  0-15 Units Subcutaneous TID WC  . levothyroxine  112.5 mcg Oral QAC breakfast  . mouth rinse  15 mL Mouth Rinse QID  . senna-docusate  1 tablet Oral Daily  . sodium chloride flush  10-40 mL Intracatheter Q12H  . sodium chloride flush  3 mL Intravenous Q12H    Infusions: . amiodarone 30 mg/hr (12/31/16 0320)  . bivalirudin (ANGIOMAX) infusion 0.5 mg/mL (Non-ACS indications) 0.02 mg/kg/hr (12/30/16 2326)  . milrinone 0.375 mcg/kg/min (12/31/16 0320)  . norepinephrine (LEVOPHED) Adult infusion 15 mcg/min (12/31/16 0000)  . dialysis replacement fluid (prismasate) 400 mL/hr at 12/30/16 2005  . dialysis replacement fluid (prismasate) 200 mL/hr at 12/30/16 2022  . dialysate (PRISMASATE) 1,500 mL/hr at 12/31/16 0506    PRN Medications: Place/Maintain arterial line **AND** sodium chloride, acetaminophen (TYLENOL) oral liquid 160 mg/5 mL, ALPRAZolam, guaiFENesin-dextromethorphan, hydrOXYzine, levalbuterol, ondansetron **OR** ondansetron (ZOFRAN) IV, sodium chloride, sodium chloride flush   Assessment/Plan   [name] is a 71 y.o. female CAD s/p CABG, chronic combined CHF, moderate aortic stenosis, DM, HLD, PVD followed by Dr. Donnetta [name] s/p L foot transmetatarsal amputation, hypothyroidism, syncope, CKD stage III-IV, and Iron/B12 deficiency.  Admitted for  edema and poor urine output.  Found to be hypotensive and in cardiogenic shock. Also found to be in [place] Afib. ? Infection origin/early + UA, UCx with E. Coli and CXR concerning for PNA.  1. Acute on chronic biventricular CHF: Ischemic cardiomyopathy.  Echo with admission with EF 15%, severely decreased RV systolic function, mid to moderate aortic stenosis.  CVP 18 today.  Pt remains markedly volume overloaded on exam. Co-ox 86%. Cardiac output appears adequate on milrinone 0.375 mcg/kg/min and levophed 15.  She diuresed poorly despite high dose diuretics, creatinine elevated.  Therefore, CVVH started on 3/17.  UF now at 150 cc/hr, -4.3 L yesterday.   - Continue CVVH per renal.  Has considerably more fluid to remove.  BP is tolerating so far, continue milrinone and norepinephrine support.  - She does not have ICD. Will eventually need consideration with  long standing low EF if she recovers from this hospitalization. Unstable currently and with chronic leg wounds so this would be down the road.  - Not good candidate for advanced therapies with RV failure, renal failure, and limited mobility. 2. Atrial fibrillation versus atypical flutter: Remains in Afib but rate stable on amio gtt.  - Continue amio gtt for now.   - Plan eventual TEE/DCCV if she stabilizes.  - Will need anticoag with CHA2DS2/VASc of 6.  On Bivalirudin. Hit antibody sent and pending.  3. AKI on CKD stage 3: Baseline creatinine 1.4-1.6.   Suspect component of cardiorenal syndrome but also ATN with hypotensive episode initially.  She is now on CVVH per nephrology to see if volume unloading will help her renal function.  She is not a good long-term HD candidate.  Very minimal UOP, I am concerned that her renal function will not recover.  4. CAD: No chest pain. Last cath in 12/12 with patent LIMA to LAD with occlusion of distal LAD; High grade stenosis of nondominant RCA; Signif disease of prox LCx; SVG to L PDA occluded, SVG to OM patent).   Attempted PTCA of proximal LCx but failed 10/2011. No good  revascularization options.   - Continue ASA 81 and statin for now.  5. PAD: Extensive, especially in the left leg.  She is s/p left transmetatarsal amputation.  This has led to her limited mobility. Followed by [facility].  - No change to current plan.  6. UTI: E coli. Has completed aztreonam course.    7. Right pleural effusion:  May need to consider thoracentesis eventually.   8. Aortic stenosis: Mild to possibly moderate on echo. No change to current plan.  9. Epistaxis: Resolved, now on bivalirudin gtt (low plts).  Keep O2 humidified.    10. Hyponatremia: Sodium 131 Fluid restrict 1500 cc.  11. Thrombocytopenia: Platelets falling, 102>85. Now on bivalirudin. HIT antibody pending. .     Will need Palliative Care consult for goals of care.    Length of Stay: 7  [name] Clegg, NP  12/31/2016, 7:14 AM  Advanced Heart Failure Team Pager [phone] (M-F; [place])  Please contact [facility] for night-coverage after hours (4p -7a ) and weekends on [email or web address]  Patient seen with NP, agree with the above note.  CVVH ongoing, good fluid removal.  CVP now 18.  Needs further fluid removal.  BP stable on current support.  Making minimal urine.  We will continue CVVH to lower CVP.  Will see if decrease in renal venous pressure helps overall renal function, but I am concerned that renal function is not going to return adequately.  In that case, she is a poor HD candidate and we will need to move towards comfort care.   I will have palliative care see her today.   Loralie Champagne 12/31/2016 8:10 AM

## 2016-12-31 NOTE — Progress Notes (Signed)
Patient ID: [name], female   DOB: 1946/01/25, 71 y.o.   MRN: [id number]  [facility] Progress Note   Assessment/ Plan:   1. Acute kidney injury on chronic kidney disease stage III: Suspect cardiorenal syndrome with superimposed ATN with relative hypotension/cardiogenic shock.  started on CRRT 3/17 after refractoriness to diuretics/inotropic support. Some net negative ultrafiltration noted overnight but CVP remains elevated- remains pressor dependent- is likely not a candidate for chronic HD given cardiomyopathy.  Continue CRRT no changes to orders for now 2. Acute exacerbation of congestive heart failure: History of ischemic cardiomyopathy with EF of 15%. Started on CRRT after failed response to diuretic/inotropes. Have been able to remove fluid but now pressors causing decreased perfusion of extremities 3. Hyponatremia: Secondary to CHF exacerbation and acute on chronic renal insufficiency- somewhat better with ultrafiltration. 4. New onset atrial fibrillation/flutter: Remains rate controlled and currently on amiodarone drip. 5. Elytes- controlled- all 4 K   Subjective:   on CRRT- is running well- able to achieve UF of nearly 4 liters. I have gone over the situation with the patient - that if kidneys do not turn around we dont feel candidate for IHD.  She seems to understand and is asking appropriate questions- wanting to get her affairs in order   Objective:   BP (!) 103/56   Pulse (!) 114   Temp 98.8 F (37.1 C) (Axillary)   Resp 15   Ht 5\' 3"  (1.6 m)   Wt 85.7 kg (188 lb 15 oz)   SpO2 94%   BMI 33.47 kg/m   Intake/Output Summary (Last 24 hours) at 12/31/16 0859 Last data filed at 12/31/16 0800  Gross per 24 hour  Intake          1337.62 ml  Output             5617 ml  Net         -4279.38 ml   Weight change:   Physical Exam: Gen: Appears to be uncomfortable resting in bed with intermittent left leg pain- says feels decent today CVS: Pulse  irregularly irregular tachycardia, 3/6 holosystolic murmur, elevated JVD Resp: Coarse breath sounds anteriorly Abd: Soft, obese, nontender Ext: 2+ lower extremity edema with Ace wrap over her extremities - painful and amputations  Imaging: Ir Fluoro Guide Cv Line Left  Result Date: 12/29/2016 INDICATION: 71 year old female with cardiogenic shock and cardio renal syndrome. She is severely volume overload in requires temporary dialysis. EXAM: IR LEFT FLOURO GUIDE CV LINE; IR ULTRASOUND GUIDANCE VASC ACCESS LEFT MEDICATIONS: None ANESTHESIA/SEDATION: None FLUOROSCOPY TIME:  Fluoroscopy Time: 0 minutes 30 seconds (4 mGy). COMPLICATIONS: None immediate. PROCEDURE: Informed written consent was obtained from the patient after a thorough discussion of the procedural risks, benefits and alternatives. All questions were addressed. Maximal Sterile Barrier Technique was utilized including caps, mask, sterile gowns, sterile gloves, sterile drape, hand hygiene and skin antiseptic. A timeout was performed prior to the initiation of the procedure. The left internal jugular vein was interrogated with ultrasound and found to be widely patent. An image was obtained and stored for the medical record. Local anesthesia was attained by infiltration with 1% lidocaine. A small dermatotomy was made. Under real-time sonographic guidance, the vessel was punctured with a 21 gauge micropuncture needle. Using standard technique, the initial micro needle was exchanged over a 0.018 micro wire for a transitional 4 [place] micro sheath. The micro sheath was then exchanged over a 0.035 wire and the skin tract was dilated. A  Covidien 24 cm Trialysis catheter was then advanced over the wire and position with the tip in the upper right atrium. The catheter was flushed with saline and locked with heparinized saline. A fluoroscopic spot image was obtained documenting catheter tip position. The catheter was then secured to the skin with 2 0 Prolene  suture and a sterile bandage. The patient tolerated the procedure well. IMPRESSION: Successful placement of a left IJ approach Trialysis catheter. The catheter tips are in the upper right atrium and ready for immediate use. Signed, Criselda Peaches, MD [facility] Electronically Signed   By: Jacqulynn Cadet M.D.   On: 12/29/2016 17:12   Ir US Guide Vasc Access Left  Result Date: 12/29/2016 INDICATION: 71 year old female with cardiogenic shock and cardio renal syndrome. She is severely volume overload in requires temporary dialysis. EXAM: IR LEFT FLOURO GUIDE CV LINE; IR ULTRASOUND GUIDANCE VASC ACCESS LEFT MEDICATIONS: None ANESTHESIA/SEDATION: None FLUOROSCOPY TIME:  Fluoroscopy Time: 0 minutes 30 seconds (4 mGy). COMPLICATIONS: None immediate. PROCEDURE: Informed written consent was obtained from the patient after a thorough discussion of the procedural risks, benefits and alternatives. All questions were addressed. Maximal Sterile Barrier Technique was utilized including caps, mask, sterile gowns, sterile gloves, sterile drape, hand hygiene and skin antiseptic. A timeout was performed prior to the initiation of the procedure. The left internal jugular vein was interrogated with ultrasound and found to be widely patent. An image was obtained and stored for the medical record. Local anesthesia was attained by infiltration with 1% lidocaine. A small dermatotomy was made. Under real-time sonographic guidance, the vessel was punctured with a 21 gauge micropuncture needle. Using standard technique, the initial micro needle was exchanged over a 0.018 micro wire for a transitional 4 [place] micro sheath. The micro sheath was then exchanged over a 0.035 wire and the skin tract was dilated. A Covidien 24 cm Trialysis catheter was then advanced over the wire and position with the tip in the upper right atrium. The catheter was flushed with saline and locked  with heparinized saline. A fluoroscopic spot image was obtained documenting catheter tip position. The catheter was then secured to the skin with 2 0 Prolene suture and a sterile bandage. The patient tolerated the procedure well. IMPRESSION: Successful placement of a left IJ approach Trialysis catheter. The catheter tips are in the upper right atrium and ready for immediate use. Signed, Criselda Peaches, MD [facility] Electronically Signed   By: Jacqulynn Cadet M.D.   On: 12/29/2016 17:12   Dg Chest Port 1 View  Result Date: 12/31/2016 CLINICAL DATA:  Respiratory failure . EXAM: PORTABLE CHEST 1 VIEW COMPARISON:  12/29/2016 . FINDINGS: Bilateral IJ lines in stable position. Prior CABG. Diffuse bilateral pulmonary infiltrates/edema and bilateral pleural effusions. Slight worsening from prior exam. No pneumothorax. IMPRESSION: 1.  Bilateral IJ lines in stable position. 2. Prior CABG. Diffuse bilateral pulmonary infiltrates and bilateral pleural effusions. Slight worsening from prior exam. Electronically Signed   By: Marcello Moores  Register   On: 12/31/2016 06:59   Dg Chest Port 1 View  Result Date: 12/29/2016 CLINICAL DATA:  71 year old female with history of hypoxia. EXAM: PORTABLE CHEST 1 VIEW COMPARISON:  Chest x-ray 12/27/2016. FINDINGS: There is a right-sided internal jugular central venous catheter with tip terminating in the right atrium. Left internal jugular Vas-Cath with tip terminating in the superior aspect of the right atrium. Lung volumes are low. No pneumothorax. Small left and moderate  right pleural effusions, similar to the prior study. Bibasilar opacities may reflect areas of atelectasis and/or airspace consolidation. There is cephalization of the pulmonary vasculature and slight indistinctness of the interstitial markings suggestive of mild pulmonary edema. Mild cardiomegaly. Upper mediastinal contours are slightly distorted by  patient's rotation on the right. Status post median sternotomy for CABG, including LIMA. IMPRESSION: 1. Support apparatus, as above. 2. The appearance of the chest suggests mild congestive heart failure, as above. 3. Moderate right and small left pleural effusions with dependent opacities which may reflect areas of atelectasis and/or airspace consolidation. Electronically Signed   By: Vinnie Langton M.D.   On: 12/29/2016 18:18    Labs: BMET  Recent Labs Lab 12/28/16 0556 12/28/16 1753 12/29/16 0400 12/29/16 1901 12/30/16 0411 12/30/16 1526 12/31/16 0411  NA 126* 126* 125* 123* 126* 130* 131*  K 4.3 4.3 4.3 4.4 4.4 4.1 4.7  CL 96* 94* 93* 92* 93* 100* 99*  CO2 21* 20* 20* 18* 21* 19* 19*  GLUCOSE 176* 163* 74 141* 137* 155* 176*  BUN 86* 85* 88* 84* 62* 40* 31*  CREATININE 2.97* 3.06* 3.07* 3.20* 2.50* 1.82* 1.91*  CALCIUM 8.7* 8.8* 8.7* 8.1* 8.6* 7.5* 8.6*  PHOS 5.4* 5.5* 5.4* 6.2* 4.5 3.0 3.1   CBC  Recent Labs Lab 12/26/16 0201  12/28/16 0602 12/29/16 0358 12/30/16 0411 12/31/16 0411  WBC 7.2  < > 7.6 8.1 8.9 9.8  NEUTROABS 5.5  --   --   --   --   --   HGB 15.4*  < > 13.6 13.1 13.6 13.4  HCT 47.3*  < > 41.4 38.9 40.0 40.4  MCV 92.9  < > 91.0 89.0 88.5 90.2  PLT 150  < > 127* 118* 102* 85*  < > = values in this interval not displayed. Medications:    . aspirin EC  81 mg Oral Daily  . atorvastatin  40 mg Oral QHS  . Chlorhexidine Gluconate Cloth  6 each Topical Daily  . Gerhardt's butt cream   Topical BID  . guaiFENesin  600 mg Oral BID  . insulin aspart  0-15 Units Subcutaneous TID WC  . levothyroxine  112.5 mcg Oral QAC breakfast  . mouth rinse  15 mL Mouth Rinse QID  . senna-docusate  1 tablet Oral Daily  . sodium chloride flush  10-40 mL Intracatheter Q12H  . sodium chloride flush  3 mL Intravenous Q12H   Mady Oubre A  12/31/2016, 8:59 AM

## 2016-12-31 NOTE — Progress Notes (Signed)
ANTICOAGULATION CONSULT NOTE - Initial Consult  Pharmacy Consult for Bivalirudin Indication: atrial fibrillation/Rule-out HIT/CRRT  Allergies  Allergen Reactions  . Cephalexin Swelling and Rash  . Ciprofloxacin Swelling and Rash  . Zyvox [Linezolid] Other (See Comments)    Thrombocytopenia developed to 50k after several weeks of therapy  . Adhesive [Tape] Other (See Comments)    "old [employer] adhesive tape; takes my skin off; can use paper tape"  . Bactrim [Sulfamethoxazole-Trimethoprim] Other (See Comments)    Hyperkalemia (elevated potassium) and Increased creatinine  . Daptomycin Other (See Comments)    Had elevated CPK on therapy, not clear that this was due to cubicin  . Vancomycin Other (See Comments)    ? If this played role in her Acute kidney injury    Patient Measurements: Height: 5\' 3"  (160 cm) Weight: 188 lb 15 oz (85.7 kg) IBW/kg (Calculated) : 52.4 Heparin Dosing Weight: ~70kg  Vital Signs: Temp: 99.1 F (37.3 C) (03/19 1214) Temp Source: Axillary (03/19 1214) BP: 117/64 (03/19 1214) Pulse Rate: 113 (03/19 1300)  Labs:  Recent Labs  12/29/16 0358  12/29/16 1144  12/30/16 0411  12/30/16 1526  12/30/16 2231 12/31/16 0407 12/31/16 0411 12/31/16 0943  HGB 13.1  --   --   --  13.6  --   --   --   --   --  13.4  --   HCT 38.9  --   --   --  40.0  --   --   --   --   --  40.4  --   PLT 118*  --   --   --  102*  --   --   --   --   --  85*  --   APTT  --   --   --   --   --   < >  --   < > 76* 66*  --  68*  LABPROT  --   --  15.2  --   --   --   --   --   --   --   --   --   INR  --   --  1.20  --   --   --   --   --   --   --   --   --   CREATININE  --   < >  --   < > 2.50*  --  1.82*  --   --   --  1.91*  --   < > = values in this interval not displayed.  Estimated Creatinine Clearance: 28 mL/min (A) (by C-G formula based on SCr of 1.91 mg/dL (H)).  Medications: Heparin on hold  Assessment: 70yof previously on heparin for afib with  CHA2DS2/VASc of 6.  Heparin was on hold on 3/15 and 3/17 with recurrent epistaxis and hematuria. Resumed anticoagulation with bivalirudin on 3/18. Patient with recent decrease in platelets ~50% from last month. Calculated 4T score ~2. HIT antibody still in process.   Platelets continue to drop, despite being on Bivalirudin. Hgb stable and no signs of bleeding per nurse. APTT has been therapeutic x2, so will transition to daily aPTT.    Of note, patient was also started on CRRT 3/17.  Goal of Therapy:  Goal aPTT: 50-70 per Dr. Aundra Dubin due to bleeding risk Monitor platelets by anticoagulation protocol: Yes   Plan:  1) Continue bivalirudin 0.02mg /kg/hr 2) Daily aPTT and CBC  Dierdre Harness,  BS, PharmD Clinical Pharmacy Resident [phone] (Pager) 12/31/2016 1:58 PM

## 2016-12-31 NOTE — Progress Notes (Signed)
PULMONARY / CRITICAL CARE MEDICINE   Name: [name] MRN: [id number] DOB: 1946-02-25    ADMISSION DATE:  12/22/2016 CONSULTATION DATE:  12/26/16  REFERRING MD:  Maylene Roes  CHIEF COMPLAINT:  Hypotension  HISTORY OF PRESENT ILLNESS:  Pt is encephelopathic; therefore, this HPI is obtained from chart review. [name] is a 71 y.o. female with PMH as outlined below. She was admitted 12/16/2016 with decreased UOP.  She had called cardiology prior to coming to ED who had instructed her to increase her torsemide.  She was found to have AoCKD and hypovolemia.  She was seen by cardiology and nephrology 3/13.  Diuretics were held and she had improvement in renal function but cardiology felt she was developing volume overload.  On 3/13, she also developed new onset A.fib with RVR for which she was started on amiodarone and IV heparin.  Heart failure team was asked to see and were planning to see 12/26/16.  During early AM hours 3/14, she developed worsening hypotension.  Of note, she had received 12.5mg  carvedilol at 1747 and 30mg  Imdur at 2211 the evening prior.  TRH evaluated pt at bedside and ordered 752ml NS bolus with improvement in SBP to mid 80's.  She was also started on dobutamine.  PCCM was subsequently asked to see and transfer to ICU for further evaluation and management.  Of note, she had echo 3/13 which demonstrated EF 15%, mild AS and AR, mild MR, mod TR.  Previous echo Oct 2016 had EF 25-30% with G2DD.   SUBJECTIVE:  Required BiPAP overnight, tolerating volume negative with pressor support  VITAL SIGNS: BP (!) 103/56   Pulse 99   Temp 98.8 F (37.1 C) (Axillary)   Resp 19   Ht 5\' 3"  (1.6 m)   Wt 85.7 kg (188 lb 15 oz)   SpO2 92%   BMI 33.47 kg/m   HEMODYNAMICS: CVP:  [18 mmHg-23 mmHg] 20 mmHg  VENTILATOR SETTINGS:    INTAKE / OUTPUT: I/O last 3 completed shifts: In: 1955.9 [P.O.:60; I.V.:1845.9; IV Piggyback:50] Out: 8484 [Urine:66; Other:8418]   PHYSICAL  EXAMINATION: GEN : chronically ill appearing , on O2 in bed, resting, in NAD Neuro : awake/alert, f/c commands, moving all ext to command HEENT : [place]/AT, PERRL, EOM-I and MMM CARD: IRIR, Nl S1/S2, -M/R/G. Lungs: Bibasilar crackles ABD : Soft, NT, ND and +BS Musculoskeletal: hx of left ft amputation Skin : Chronic leg wounds/dsg   LABS:  BMET  Recent Labs Lab 12/30/16 0411 12/30/16 1526 12/31/16 0411  NA 126* 130* 131*  K 4.4 4.1 4.7  CL 93* 100* 99*  CO2 21* 19* 19*  BUN 62* 40* 31*  CREATININE 2.50* 1.82* 1.91*  GLUCOSE 137* 155* 176*    Electrolytes  Recent Labs Lab 12/26/16 0201  12/30/16 0411 12/30/16 1526 12/31/16 0411  CALCIUM 8.9  < > 8.6* 7.5* 8.6*  MG 2.5*  --  2.3  --  2.4  PHOS 5.0*  < > 4.5 3.0 3.1  < > = values in this interval not displayed.  CBC  Recent Labs Lab 12/29/16 0358 12/30/16 0411 12/31/16 0411  WBC 8.1 8.9 9.8  HGB 13.1 13.6 13.4  HCT 38.9 40.0 40.4  PLT 118* 102* 85*    Coag's  Recent Labs Lab 12/29/16 1144  12/30/16 2231 12/31/16 0407 12/31/16 0943  APTT  --   < > 76* 66* 68*  INR 1.20  --   --   --   --   < > =  values in this interval not displayed.  Sepsis Markers  Recent Labs Lab 12/26/16 0201 12/26/16 0259 12/29/16 1247  LATICACIDVEN 2.4* 1.82 1.3    ABG  Recent Labs Lab 12/29/16 1301  PHART 7.345*  PCO2ART 31.5*  PO2ART 60.0*    Liver Enzymes  Recent Labs Lab 12/17/2016 2016 12/25/16 0705 12/26/16 0201  12/30/16 0411 12/30/16 1526 12/31/16 0411  AST 24 23 30   --   --   --   --   ALT 15 13* 15  --   --   --   --   ALKPHOS 115 104 112  --   --   --   --   BILITOT 1.1 1.1 0.7  --   --   --   --   ALBUMIN 3.1* 2.9* 2.9*  < > 2.7* 2.3* 2.6*  < > = values in this interval not displayed.  Cardiac Enzymes  Recent Labs Lab 01/05/2017 2016 12/25/16 0156 12/25/16 0705  TROPONINI 0.06* 0.06* 0.06*    Glucose  Recent Labs Lab 12/29/16 1625 12/29/16 2213 12/30/16 0759 12/30/16 1151  12/30/16 1543 12/31/16 0814  GLUCAP 117* 148* 114* 118* 80 185*   Imaging Dg Chest Port 1 View  Result Date: 12/31/2016 CLINICAL DATA:  Respiratory failure . EXAM: PORTABLE CHEST 1 VIEW COMPARISON:  12/29/2016 . FINDINGS: Bilateral IJ lines in stable position. Prior CABG. Diffuse bilateral pulmonary infiltrates/edema and bilateral pleural effusions. Slight worsening from prior exam. No pneumothorax. IMPRESSION: 1.  Bilateral IJ lines in stable position. 2. Prior CABG. Diffuse bilateral pulmonary infiltrates and bilateral pleural effusions. Slight worsening from prior exam. Electronically Signed   By: Marcello Moores  Register   On: 12/31/2016 06:59     STUDIES:  Echo 3/13 > EF 15%, mild AS and AR, mild MR, mod TR. V/Q 3/14 > low prob  CULTURES: Blood 3/14 > Urine 3/12 > E coli > R to unasyn, S otherwise; ESBL negative  ANTIBIOTICS: Aztreonam 3/12 > 3/13; 3/14 >>  Doxy 3/12 > 3/13   SIGNIFICANT EVENTS: 3/12 > admit. 3/14 > new hypotension > transfer to ICU > started on dobutamine. 3/17 >HD cath placed, CVVH started. Hypotension >Levophed started , BIPAP   LINES/TUBES: CVL 3/14 >  DISCUSSION: 71 y.o. female with hx CKD, combined heart failure, admitted 3/12 with oliguria and decompensated heart failure.  Developed hypotension early AM 3/14. E coli UTI.  Worsening renal failure with decompensated CHF , CVVHD started 3/17 .   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 Hypotension - likely due to above + BP meds + possible sepsis due to UTI New onset A.Fib RVR - started on amio and heparin 3/13. Hx PVD, HTN, HLD, CAD s/p CABG 1993, AS. CVVH started 3/17  P:  - Milrinone + norepi - Follow SvO2 - Anticoag per cardiology plans-angiomax started 3/18  - Continue amiodarone - ASA, lipitor - CVVHD as per Renal -150 - Titrate pressors for MAP goal >65.   PULMONARY A: Acute hypoxic  respiratory failure, likely due to pulm edema P:   - Pulmonary hygiene,  - Continue rx for her CHF - BIPAP as needed.  - DNI status after discussion  RENAL A:   AoCKD, progressive, consistent with ATN and cardiorenal syndrome; S Cr appears to be plateauing 3/16 Hyponatremia - due to above. P:   - CVVHD started 3/17 per Renal - Follow UOP and BMP - For CRRT  per renal -150 ml/hr   GASTROINTESTINAL A:   GERD. Nutrition. P:   - Diet as ordered  HEMATOLOGIC A: Hemoconcentration.   VTE Prophylaxis. P:  - Follow CBC  INFECTIOUS A:   UTI - E coli R to PCN's (no ESBL) Cellulitis of LE's - s/p treatment with doxy. P:   - Observe off abx - Wound care following   ENDOCRINE A:   DM. Hypothyroidism. BS trending down , Levemir d/c 3/17  P:   - SSI - Synthroid PO, note T4  NEUROLOGIC A:   Hx anxiety P:   - Follow, on home xanax  Family updated: Patient updated bedside, DNI status after conversation with discussion  Interdisciplinary Family Meeting v Palliative Care Meeting:  Due by: 01/01/17.  The patient is critically ill with multiple organ systems failure and requires high complexity decision making for assessment and support, frequent evaluation and titration of therapies, application of advanced monitoring technologies and extensive interpretation of multiple databases.   Critical Care Time devoted to patient care services described in this note is  35  Minutes. This time reflects time of care of this signee Dr Jennet Maduro. This critical care time does not reflect procedure time, or teaching time or supervisory time of PA/NP/Med student/Med Resident etc but could involve care discussion time.  Rush Farmer, M.D. [facility] Pulmonary/Critical Care Medicine. Pager: [phone]. After hours pager: [phone].  12/31/2016

## 2017-01-01 ENCOUNTER — Encounter (HOSPITAL_COMMUNITY): Payer: Medicare Other

## 2017-01-01 ENCOUNTER — Ambulatory Visit: Payer: Medicare Other | Admitting: Vascular Surgery

## 2017-01-01 DIAGNOSIS — Z7189 Other specified counseling: Secondary | ICD-10-CM

## 2017-01-01 LAB — RENAL FUNCTION PANEL
ALBUMIN: 2.4 g/dL — AB (ref 3.5–5.0)
ANION GAP: 10 (ref 5–15)
BUN: 14 mg/dL (ref 6–20)
CALCIUM: 8.6 mg/dL — AB (ref 8.9–10.3)
CO2: 21 mmol/L — AB (ref 22–32)
CREATININE: 1.44 mg/dL — AB (ref 0.44–1.00)
Chloride: 101 mmol/L (ref 101–111)
GFR calc non Af Amer: 36 mL/min — ABNORMAL LOW (ref >60)
GFR, EST AFRICAN AMERICAN: 41 mL/min — AB (ref >60)
GLUCOSE: 192 mg/dL — AB (ref 65–99)
PHOSPHORUS: 1.7 mg/dL — AB (ref 2.5–4.6)
Potassium: 4.2 mmol/L (ref 3.5–5.1)
SODIUM: 132 mmol/L — AB (ref 135–145)

## 2017-01-01 LAB — COOXEMETRY PANEL
Carboxyhemoglobin: 1.2 % (ref 0.5–1.5)
Methemoglobin: 1 % (ref 0.0–1.5)
O2 SAT: 65.9 %
TOTAL HEMOGLOBIN: 13.2 g/dL (ref 12.0–16.0)

## 2017-01-01 LAB — GLUCOSE, CAPILLARY
GLUCOSE-CAPILLARY: 201 mg/dL — AB (ref 65–99)
GLUCOSE-CAPILLARY: 189 mg/dL — AB (ref 65–99)
Glucose-Capillary: 168 mg/dL — ABNORMAL HIGH (ref 65–99)
Glucose-Capillary: 231 mg/dL — ABNORMAL HIGH (ref 65–99)

## 2017-01-01 LAB — BASIC METABOLIC PANEL
ANION GAP: 11 (ref 5–15)
BUN: 17 mg/dL (ref 6–20)
CALCIUM: 8.7 mg/dL — AB (ref 8.9–10.3)
CO2: 24 mmol/L (ref 22–32)
Chloride: 98 mmol/L — ABNORMAL LOW (ref 101–111)
Creatinine, Ser: 1.47 mg/dL — ABNORMAL HIGH (ref 0.44–1.00)
GFR calc non Af Amer: 35 mL/min — ABNORMAL LOW (ref >60)
GFR, EST AFRICAN AMERICAN: 40 mL/min — AB (ref >60)
GLUCOSE: 161 mg/dL — AB (ref 65–99)
POTASSIUM: 4 mmol/L (ref 3.5–5.1)
Sodium: 133 mmol/L — ABNORMAL LOW (ref 135–145)

## 2017-01-01 LAB — CBC
HCT: 40.4 % (ref 36.0–46.0)
Hemoglobin: 13.3 g/dL (ref 12.0–15.0)
MCH: 29.7 pg (ref 26.0–34.0)
MCHC: 32.9 g/dL (ref 30.0–36.0)
MCV: 90.2 fL (ref 78.0–100.0)
PLATELETS: 58 10*3/uL — AB (ref 150–400)
RBC: 4.48 MIL/uL (ref 3.87–5.11)
RDW: 16.8 % — AB (ref 11.5–15.5)
WBC: 9.7 10*3/uL (ref 4.0–10.5)

## 2017-01-01 LAB — MAGNESIUM: MAGNESIUM: 2.4 mg/dL (ref 1.7–2.4)

## 2017-01-01 LAB — APTT: aPTT: 71 seconds — ABNORMAL HIGH (ref 24–36)

## 2017-01-01 LAB — ALBUMIN: ALBUMIN: 2.5 g/dL — AB (ref 3.5–5.0)

## 2017-01-01 LAB — PHOSPHORUS: Phosphorus: 2.1 mg/dL — ABNORMAL LOW (ref 2.5–4.6)

## 2017-01-01 MED ORDER — SODIUM PHOSPHATES 45 MMOLE/15ML IV SOLN
20.0000 mmol | Freq: Once | INTRAVENOUS | Status: AC
  Administered 2017-01-01: 20 mmol via INTRAVENOUS
  Filled 2017-01-01: qty 6.67

## 2017-01-01 NOTE — Progress Notes (Signed)
PULMONARY / CRITICAL CARE MEDICINE   Name: [name] MRN: [id number] DOB: [birth year]    ADMISSION DATE:  01/05/2017 CONSULTATION DATE:  12/26/16  REFERRING MD:  Maylene Roes  CHIEF COMPLAINT:  Hypotension  HISTORY OF PRESENT ILLNESS:  Pt is encephelopathic; therefore, this HPI is obtained from chart review. [name] is a 71 y.o. female with PMH as outlined below. She was admitted 12/27/2016 with decreased UOP.  She had called cardiology prior to coming to ED who had instructed her to increase her torsemide.  She was found to have AoCKD and hypovolemia.  She was seen by cardiology and nephrology 3/13.  Diuretics were held and she had improvement in renal function but cardiology felt she was developing volume overload.  On 3/13, she also developed new onset A.fib with RVR for which she was started on amiodarone and IV heparin.  Heart failure team was asked to see and were planning to see 12/26/16.  During early AM hours 3/14, she developed worsening hypotension.  Of note, she had received 12.5mg  carvedilol at 1747 and 30mg  Imdur at 2211 the evening prior.  TRH evaluated pt at bedside and ordered 747ml NS bolus with improvement in SBP to mid 80's.  She was also started on dobutamine.  PCCM was subsequently asked to see and transfer to ICU for further evaluation and management.  Of note, she had echo 3/13 which demonstrated EF 15%, mild AS and AR, mild MR, mod TR.  Previous echo Oct 2016 had EF 25-30% with G2DD.  SUBJECTIVE:  No events overnight, NAD  VITAL SIGNS: BP 114/62   Pulse (!) 107   Temp (!) 96.6 F (35.9 C) (Axillary)   Resp (!) 23   Ht 5\' 3"  (1.6 m)   Wt 74.2 kg (163 lb 9.3 oz)   SpO2 94%   BMI 28.98 kg/m   HEMODYNAMICS: CVP:  [16 mmHg-22 mmHg] 16 mmHg  VENTILATOR SETTINGS:    INTAKE / OUTPUT: I/O last 3 completed shifts: In: 2184.5 [P.O.:300; I.V.:1884.5] Out: 7332 [Urine:25; QPRFF:6384]  PHYSICAL EXAMINATION: GEN : Chronically ill appearing , on O2 in  bed, resting, in NAD Neuro : Awake/alert, f/c commands, moving all ext to command HEENT : [place]/AT, PERRL, EOM-I and MMM CARD: IRIR, Nl S1/S2, -M/R/G. Lungs: Bibasilar crackles ABD : Soft, NT, ND and +BS Musculoskeletal: hx of left ft amputation Skin : Chronic leg wounds/dsg   LABS:  BMET  Recent Labs Lab 12/31/16 0411 12/31/16 1834 01/01/17 0505  NA 131* 133* 133*  K 4.7 4.3 4.0  CL 99* 101 98*  CO2 19* 22 24  BUN 31* 22* 17  CREATININE 1.91* 1.69* 1.47*  GLUCOSE 176* 173* 161*   Electrolytes  Recent Labs Lab 12/30/16 0411  12/31/16 0411 12/31/16 1834 01/01/17 0505  CALCIUM 8.6*  < > 8.6* 8.7* 8.7*  MG 2.3  --  2.4  --  2.4  PHOS 4.5  < > 3.1 2.4* 2.1*  < > = values in this interval not displayed.  CBC  Recent Labs Lab 12/30/16 0411 12/31/16 0411 01/01/17 0505  WBC 8.9 9.8 9.7  HGB 13.6 13.4 13.3  HCT 40.0 40.4 40.4  PLT 102* 85* 58*   Coag's  Recent Labs Lab 12/29/16 1144  12/31/16 0407 12/31/16 0943 01/01/17 0505  APTT  --   < > 66* 68* 71*  INR 1.20  --   --   --   --   < > = values in this interval not displayed.  Sepsis  Markers  Recent Labs Lab 12/26/16 0201 12/26/16 0259 12/29/16 1247  LATICACIDVEN 2.4* 1.82 1.3   ABG  Recent Labs Lab 12/29/16 1301  PHART 7.345*  PCO2ART 31.5*  PO2ART 60.0*   Liver Enzymes  Recent Labs Lab 12/26/16 0201  12/31/16 0411 12/31/16 1834 01/01/17 0505  AST 30  --   --   --   --   ALT 15  --   --   --   --   ALKPHOS 112  --   --   --   --   BILITOT 0.7  --   --   --   --   ALBUMIN 2.9*  < > 2.6* 2.5* 2.5*  < > = values in this interval not displayed.  Cardiac Enzymes No results for input(s): TROPONINI, PROBNP in the last 168 hours.  Glucose  Recent Labs Lab 12/30/16 1543 12/31/16 0814 12/31/16 1222 12/31/16 1711 12/31/16 2218 01/01/17 0813  GLUCAP 80 185* 197* 180* 159* 168*   Imaging No results found.   STUDIES:  Echo 3/13 > EF 15%, mild AS and AR, mild MR, mod TR. V/Q  3/14 > low prob  CULTURES: Blood 3/14 > Urine 3/12 > E coli > R to unasyn, S otherwise; ESBL negative  ANTIBIOTICS: Aztreonam 3/12 > 3/13; 3/14 >>  Doxy 3/12 > 3/13   SIGNIFICANT EVENTS: 3/12 > admit. 3/14 > new hypotension > transfer to ICU > started on dobutamine. 3/17 >HD cath placed, CVVH started. Hypotension >Levophed started , BIPAP   LINES/TUBES: CVL 3/14 >  DISCUSSION: 71 y.o. female with hx CKD, combined heart failure, admitted 3/12 with oliguria and decompensated heart failure.  Developed hypotension early AM 3/14. E coli UTI.  Worsening renal failure with decompensated CHF , CVVHD started 3/17 .   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 Hypotension - likely due to above + BP meds + possible sepsis due to UTI New onset A.Fib RVR - started on amio and heparin 3/13. Hx PVD, HTN, HLD, CAD s/p CABG 1993, AS. CVVH started 3/17  P:  - Milrinone + norepi, titrate for BP - Follow SvO2 - Anticoag per cardiology plans-angiomax started 3/18  - Continue amiodarone - ASA, lipitor - CVVHD as per Renal -150 - Titrate pressors for MAP goal >65.   PULMONARY A: Acute hypoxic respiratory failure, likely due to pulm edema Worsening right sided effusion on CXR that I reviewed myself. P:   - Pulmonary hygiene,  - Continue rx for her CHF - BIPAP as needed.  - DNI status after discussion - Thora only if respiratory status deteriorates  RENAL A:   AoCKD, progressive, consistent with ATN and cardiorenal syndrome; S Cr appears to be plateauing 3/16 Hyponatremia - due to above. P:   - CVVHD started 3/17 per Renal - Follow UOP and BMP - For CRRT per renal -150 ml/hr   GASTROINTESTINAL A:   GERD. Nutrition. P:   - Diet as ordered  HEMATOLOGIC A: Hemoconcentration.   VTE Prophylaxis. P:  - Follow CBC  INFECTIOUS A:   UTI - E coli R to PCN's (no ESBL) Cellulitis of  LE's - s/p treatment with doxy. P:   - Observe off abx - Wound care following   ENDOCRINE A:   DM. Hypothyroidism. BS trending down , Levemir d/c 3/17  P:   - SSI - Synthroid PO, note T4  NEUROLOGIC A:  Hx anxiety P:   - Follow, on home xanax  Family updated: Patient and brother updated bedside, DNI status, informed of pleural effusion on the right that is getting worse then will consider a thora.  Interdisciplinary Family Meeting v Palliative Care Meeting:  Due by: 01/01/17.  The patient is critically ill with multiple organ systems failure and requires high complexity decision making for assessment and support, frequent evaluation and titration of therapies, application of advanced monitoring technologies and extensive interpretation of multiple databases.   Critical Care Time devoted to patient care services described in this note is  35  Minutes. This time reflects time of care of this signee Dr Jennet Maduro. This critical care time does not reflect procedure time, or teaching time or supervisory time of PA/NP/Med student/Med Resident etc but could involve care discussion time.  Rush Farmer, M.D. [facility] Pulmonary/Critical Care Medicine. Pager: [phone]. After hours pager: [phone].  01/01/2017

## 2017-01-01 NOTE — Progress Notes (Signed)
ANTICOAGULATION CONSULT NOTE - Initial Consult  Pharmacy Consult for Bivalirudin Indication: atrial fibrillation/Rule-out HIT/CRRT  Allergies  Allergen Reactions  . Cephalexin Swelling and Rash  . Ciprofloxacin Swelling and Rash  . Zyvox [Linezolid] Other (See Comments)    Thrombocytopenia developed to 50k after several weeks of therapy  . Adhesive [Tape] Other (See Comments)    "old Johnson/Johnson adhesive tape; takes my skin off; can use paper tape"  . Bactrim [Sulfamethoxazole-Trimethoprim] Other (See Comments)    Hyperkalemia (elevated potassium) and Increased creatinine  . Daptomycin Other (See Comments)    Had elevated CPK on therapy, not clear that this was due to cubicin  . Vancomycin Other (See Comments)    ? If this played role in her Acute kidney injury    Patient Measurements: Height: 5\' 3"  (160 cm) Weight: 163 lb 9.3 oz (74.2 kg) IBW/kg (Calculated) : 52.4 Heparin Dosing Weight: ~70kg  Vital Signs: Temp: 96.6 F (35.9 C) (03/20 0700) Temp Source: Axillary (03/20 0700) BP: 114/62 (03/19 2310) Pulse Rate: 107 (03/20 1000)  Labs:  Recent Labs  12/29/16 1144  12/30/16 0411  12/31/16 0407 12/31/16 0411 12/31/16 0943 12/31/16 1834 01/01/17 0505  HGB  --   < > 13.6  --   --  13.4  --   --  13.3  HCT  --   --  40.0  --   --  40.4  --   --  40.4  PLT  --   --  102*  --   --  85*  --   --  58*  APTT  --   --   --   < > 66*  --  68*  --  71*  LABPROT 15.2  --   --   --   --   --   --   --   --   INR 1.20  --   --   --   --   --   --   --   --   CREATININE  --   < > 2.50*  < >  --  1.91*  --  1.69* 1.47*  < > = values in this interval not displayed.  Estimated Creatinine Clearance: 33.9 mL/min (A) (by C-G formula based on SCr of 1.47 mg/dL (H)).  Medications: Heparin on hold  Assessment: 70yof previously on heparin for afib with CHA2DS2/VASc of 6.  Heparin was on hold on 3/15 and 3/17 with recurrent epistaxis and hematuria. Resumed anticoagulation with  bivalirudin on 3/18. Patient with recent decrease in platelets ~50% from last month. Calculated 4T score ~2. HIT antibody still in process.   Platelets continue to drop, despite being on Bivalirudin. Hgb stable and no signs of bleeding per nurse. APTT slightly over goal today. Will continue at current rate and assess tomorrow. If elevated, then reduce rate.   Of note, patient was also started on CRRT 3/17.  Goal of Therapy:  Goal aPTT: 50-70 per Dr. Aundra Dubin due to bleeding risk Monitor platelets by anticoagulation protocol: Yes   Plan:  1) Continue bivalirudin 0.02mg /kg/hr 2) Daily aPTT and CBC  Dierdre Harness, BS, PharmD Clinical Pharmacy Resident [phone] (Pager) 01/01/2017 10:41 AM

## 2017-01-01 NOTE — Progress Notes (Addendum)
Inpatient Diabetes Program Recommendations  AACE/ADA: New Consensus Statement on Inpatient Glycemic Control (2015)  Target Ranges:  Prepandial:   less than 140 mg/dL      Peak postprandial:   less than 180 mg/dL (1-2 hours)      Critically ill patients:  140 - 180 mg/dL   Lab Results  Component Value Date   GLUCAP 168 (H) 01/01/2017   HGBA1C 11.5 (H) 12/25/2016    Review of Glycemic Control:  Results for HURLEY, BLEVINS (MRN [id number]) as of 01/01/2017 10:49  Ref. Range 12/31/2016 08:14 12/31/2016 12:22 12/31/2016 17:11 12/31/2016 22:18 01/01/2017 08:13  Glucose-Capillary Latest Ref Range: 65 - 99 mg/dL 185 (H) 197 (H) 180 (H) 159 (H) 168 (H)   Diabetes history: Type 1 diabetes for 67 years Outpatient Diabetes medications: Levemir 10 units bid, Humalog 1-12 units four times a day Current orders for Inpatient glycemic control:  Novolog moderate tid with meals   Inpatient Diabetes Program Recommendations:    Consider restarting Levemir 4 units daily and decreasing Novolog correction to sensitive tid with meals.   Thanks, Adah Perl, RN, BC-ADM Inpatient Diabetes Coordinator Pager [phone] (8a-5p)   4:00 p- spoke briefly with patient. Discussed insulin needs.  May consider adding Levemir 4 units daily.  Thanks, Adah Perl, RN, BC-ADM

## 2017-01-01 NOTE — Consult Note (Signed)
Consultation Note Date: 01/01/2017   Patient Name: [name]  DOB: 03/15/1946  MRN: [id number]  Age / Sex: 71 y.o., female  PCP: Wendie Agreste, MD Referring Physician: Larey Dresser, MD  Reason for Consultation: Establishing goals of care and Psychosocial/spiritual support r/t end stage heart failure and now with renal failure.   HPI/Patient Profile: 71 y.o. female  with past medical history of systolic and diastolic combined heart failure (EF 25-30% 07/2015), CAD s/p CABG, diabetes, HLD, PVD s/p left foot transmetatarsal amputation, CKD stage III-IV iron/B12 deficiency admitted on 12/13/2016 with decreased urine output r/t worsening CHF EF 15% and likely now cardiorenal syndrome. Likely approaching EOL so palliative care consulted for [place] and support.   Clinical Assessment and Goals of Care: I met today with [name]. Her brother and nephew are at bedside and are very supportive. They are trying to assist her with getting her affairs in order.   [name] speaks to me very openly and has a good understanding of her situation. She understands that there is a very slim chance that her renal function will improve and that she is not a candidate for long term dialysis. Understands that she is likely reaching the end of her life but she is trying to maintain some level of hope that her kidneys may regain some function. However, she is working with her family to prepare for the end of her life. She currently seems in good spirits and admits distracting herself by working on getting her estate planned - I believe this is a healthy coping skill at this time.   She also tells me that she has been told that CVVH will be continued until Friday unless they begin to see some improvements. She and her family believe that this is fair. We did discuss more of what to expect at EOL if her kidneys continue to fail.  I reassure her that part of my role is for support and to help eliminate suffering to the best of my ability. She feels that she is mentally and spiritually in a good place at this time and is supported by her pastors.   She confirms to me her desire currently for no intubation but for CPR and compressions. We will discuss more and I do believe that she anticipates full comfort care if her kidneys show no improvement by Friday. Family very support and want to know any changes to plans or anything else they can do to support her. I will continue to follow.   Primary Decision Maker PATIENT    SUMMARY OF RECOMMENDATIONS   - No intubation confirmed (previously discussed by PCCM) - Discussing expectations and goals at EOL as she attempts to come to terms - D/C CVVH until Friday 01/04/17 - if she fails comfort care  Code Status/Advance Care Planning:  Limited code   Symptom Management:   Denies pain or current discomfort.   Palliative Prophylaxis:   Bowel Regimen, Delirium Protocol, Frequent Pain Assessment, Oral Care and Turn Reposition  Additional Recommendations (Limitations, Scope, Preferences):  Optimize health as much as possible  Psycho-social/Spiritual:   Desire for further Chaplaincy support:no  Additional Recommendations: Caregiving  Support/Resources, Education on Hospice, Funeral Planning/Counseling and Grief/Bereavement Support  Prognosis:   Likely days to week when no more dialysis, continued renal failure, and comfort care.   Discharge Planning: To Be Determined      Primary Diagnoses: Present on Admission: . Hyponatremia . Acute renal failure (ARF) (HCC) . Coronary atherosclerosis . CAP (community acquired pneumonia) . CKD (chronic kidney disease), stage III . Essential hypertension . DM (diabetes mellitus) type I uncontrolled with renal manifestation (HCC) . PVD (peripheral vascular disease) (HCC) . DM type 1 causing renal disease (HCC) .  Cellulitis   I have reviewed the medical record, interviewed the patient and family, and examined the patient. The following aspects are pertinent.  Past Medical History:  Diagnosis Date  . Anemia   . Anginal pain (HCC)    "history; not currently" (08/28/2012)  . Aortic stenosis    0.8cm2 on echo in 10/2011  . Arthritis    "hands, hips, all over" (08/28/2012  . B12 deficiency anemia    B 12 injection "monthly since 05/2012" (08/28/2012)  . Bilateral carpal tunnel syndrome 1970s  . Blood transfusion    no reaction from transfusion; "when I had heart surgery" (08/28/2012)  . Breast cancer (HCC)    left  . CAD (coronary artery disease)    s/p CABG x3 in 1993; last cath in 2010  . Cat bite 2009   with MRSA; required I & D  . Cellulitis   . Cellulitis June 2013, Nov. 2013 and Feb. 14, 2014   Left leg  . CHF (congestive heart failure) (HCC) Aug. 2012   Echo 10/2011: Mild LVH, EF 30%, apex akinetic, septal HK, grade 2 diastolic dysfunction, or functionally bicuspid aortic valve, mild aortic stenosis by gradient, severe by calculated AVA-suspect A. Aortic stenosis probably moderate, trivial MR, mild LAE, mild RAE.   . Claudication (HCC)   . Exertional dyspnea    "because of the heart failure" (08/28/2012)  . GERD (gastroesophageal reflux disease)   . Heart murmur   . History of bronchitis    "I've had it twice in my life" (08/28/2012)  . History of recurrent UTIs   . HTN (hypertension)   . Hyperlipidemia    on Lipitor  . Hypothyroidism   . Iron deficiency anemia 06/13/2012   "iron infusion" (08/28/2012  . Myocardial infarction 1986   "silent"  . Osteoarthritis of both feet   . Osteomyelitis (HCC)    left foot  . PVD (peripheral vascular disease) (HCC)    Toes amputated from left foot and has had prior bypass on left leg. Followed by Dr. Early  . Scleroderma (HCC)   . Seizure (HCC)    ? seizure like event in 2010; workup included stress testing which led to repeat cath.   .  Stomach ulcer 1981?   "on Tagament for ~ 1 yr" in 1980s  . Type 1 diabetes (HCC) 10/1949   Social History   Social History  . Marital status: Divorced    Spouse name: N/A  . Number of children: N/A  . Years of education: N/A   Occupational History  . Administrative Assistant    Social History Main Topics  . Smoking status: Never Smoker  . Smokeless tobacco: Never Used  . Alcohol use No  . Drug use: No  . Sexual activity: Not Currently      Birth control/ protection: Post-menopausal   Other Topics Concern  . None   Social History Narrative  . None   Family History  Problem Relation Age of Onset  . Diabetes Mother   . Stroke Mother   . Hypertension Mother   . Stroke Father   . Heart attack Neg Hx    Scheduled Meds: . atorvastatin  40 mg Oral QHS  . Chlorhexidine Gluconate Cloth  6 each Topical Daily  . Gerhardt's butt cream   Topical BID  . guaiFENesin  600 mg Oral BID  . insulin aspart  0-15 Units Subcutaneous TID WC  . levothyroxine  112.5 mcg Oral QAC breakfast  . mouth rinse  15 mL Mouth Rinse QID  . senna-docusate  1 tablet Oral Daily  . sodium chloride flush  10-40 mL Intracatheter Q12H  . sodium chloride flush  3 mL Intravenous Q12H  . sodium phosphate  Dextrose 5% IVPB  20 mmol Intravenous Once   Continuous Infusions: . amiodarone 30 mg/hr (01/01/17 0815)  . bivalirudin (ANGIOMAX) infusion 0.5 mg/mL (Non-ACS indications) 0.02 mg/kg/hr (01/01/17 0800)  . milrinone 0.375 mcg/kg/min (01/01/17 1406)  . norepinephrine (LEVOPHED) Adult infusion 10 mcg/min (01/01/17 1300)  . dialysis replacement fluid (prismasate) 400 mL/hr at 01/01/17 1128  . dialysis replacement fluid (prismasate) 200 mL/hr at 12/31/16 2203  . dialysate (PRISMASATE) 1,500 mL/hr at 01/01/17 1128   PRN Meds:.Place/Maintain arterial line **AND** sodium chloride, acetaminophen (TYLENOL) oral liquid 160 mg/5 mL, ALPRAZolam, fentaNYL (SUBLIMAZE) injection, guaiFENesin-dextromethorphan, hydrOXYzine,  levalbuterol, ondansetron **OR** ondansetron (ZOFRAN) IV, sodium chloride, sodium chloride flush Allergies  Allergen Reactions  . Cephalexin Swelling and Rash  . Ciprofloxacin Swelling and Rash  . Zyvox [Linezolid] Other (See Comments)    Thrombocytopenia developed to 50k after several weeks of therapy  . Adhesive [Tape] Other (See Comments)    "old Johnson/Johnson adhesive tape; takes my skin off; can use paper tape"  . Bactrim [Sulfamethoxazole-Trimethoprim] Other (See Comments)    Hyperkalemia (elevated potassium) and Increased creatinine  . Daptomycin Other (See Comments)    Had elevated CPK on therapy, not clear that this was due to cubicin  . Vancomycin Other (See Comments)    ? If this played role in her Acute kidney injury   Review of Systems  Constitutional: Positive for activity change and appetite change.  Respiratory: Negative for cough and shortness of breath.   Cardiovascular: Negative for chest pain.  Neurological: Positive for weakness.    Physical Exam  Constitutional: She is oriented to person, place, and time. She appears well-developed.  HENT:  Head: Normocephalic and atraumatic.  Cardiovascular: Tachycardia present.   BLE edema 1+  Pulmonary/Chest: Effort normal. No accessory muscle usage. No tachypnea. No respiratory distress.  Abdominal: Normal appearance.  Neurological: She is alert and oriented to person, place, and time.  Nursing note and vitals reviewed.   Vital Signs: BP 114/62   Pulse (!) 113   Temp 97.3 F (36.3 C) (Axillary)   Resp 17   Ht 5' 3" (1.6 m)   Wt 74.2 kg (163 lb 9.3 oz)   SpO2 95%   BMI 28.98 kg/m  Pain Assessment: 0-10   Pain Score: Asleep   SpO2: SpO2: 95 % O2 Device:SpO2: 95 % O2 Flow Rate: .O2 Flow Rate (L/min): 4 L/min  IO: Intake/output summary:  Intake/Output Summary (Last 24 hours) at 01/01/17 1508 Last data filed at 01/01/17 1400  Gross per 24 hour  Intake  1595.7 ml  Output             4860 ml   Net          -3264.3 ml    LBM: Last BM Date: 12/29/16 Baseline Weight: Weight: 72.1 kg (158 lb 15.2 oz) Most recent weight: Weight: 74.2 kg (163 lb 9.3 oz)     Palliative Assessment/Data:   Flowsheet Rows     Most Recent Value  Intake Tab  Referral Department  Cardiology  Unit at Time of Referral  ICU  Palliative Care Primary Diagnosis  Cardiac  Date Notified  12/31/16  Palliative Care Type  New Palliative care  Reason for referral  Clarify Goals of Care  Date of Admission  01/09/2017  # of days IP prior to Palliative referral  7  Clinical Assessment  Psychosocial & Spiritual Assessment  Palliative Care Outcomes       Time Total: 1mn  Greater than 50%  of this time was spent counseling and coordinating care related to the above assessment and plan.  Signed by: AVinie Sill NP Palliative Medicine Team Pager # [phone](M-F 8a-5p) Team Phone # [phone](Nights/Weekends)

## 2017-01-01 NOTE — Progress Notes (Signed)
Patient ID: [name], female   DOB: 1945/10/25, 71 y.o.   MRN: [id number]     Advanced Heart Failure Rounding Note  Referring Physician: Dr Elsworth Soho Primary Physician:Dr. Carlota Raspberry Primary Cardiologist:  Dr. Harrington Challenger  Reason for Consultation: Cardiogenic shock   Subjective:    Developed shock overnight 12/26/16. Central line and Art line placed. Started on dobutamine. AHF team consulted. Echo significant for EF 30% down to 15%.  Coox 66% on Milrinone 0.375 and levophed 125.  CVP remains  20. SBP 90s-100s.   Off heparin due to thrombocytopenia, HIT antibody sent and pending. On Bival.  Platelets continue to fall, now 58K.   With very poor diuresis and rising creatinine, she was started on CVVH on 3/17.  Currently UF at 150  cc/hr. No UOP.   Denies SOB. Having intermittent leg pain. Controlled with fentanyl.   Objective:   Weight Range: 188 lb 15 oz (85.7 kg) Body mass index is 33.47 kg/m.   Vital Signs:   Temp:  [97.7 F (36.5 C)-99.1 F (37.3 C)] 97.7 F (36.5 C) (03/19 2000) Pulse Rate:  [98-115] 113 (03/20 0600) Resp:  [9-25] 16 (03/20 0600) BP: (114-117)/(62-64) 114/62 (03/19 2310) SpO2:  [92 %-100 %] 100 % (03/20 0600) Arterial Line BP: (81-125)/(48-65) 107/57 (03/20 0600) Last BM Date: 12/29/16  Weight change: Filed Weights   12/26/16 0247 12/26/16 0430 12/30/16 0500  Weight: 158 lb 15.2 oz (72.1 kg) 175 lb 11.3 oz (79.7 kg) 188 lb 15 oz (85.7 kg)    Intake/Output:   Intake/Output Summary (Last 24 hours) at 01/01/17 0109 Last data filed at 01/01/17 0600  Gross per 24 hour  Intake             1490 ml  Output             4647 ml  Net            -3157 ml     Physical Exam: CVP 13.  General:  Elderly. NAD. In bed on her R side, CVVH ongoing.    Neck: Thick. JVP 12 cm.  No thyromegaly or nodule noted.  Cor: PMI nondisplaced. Mildly tachy, irregular. Rate normal. No S3/S4. 1/6 SEM RUSB.  Lungs: Crackles at the bases on CPAP.    Abdomen: Obese, distended,  edematous. No hepatosplenomegaly. No bruits or masses. +BS Extremities: no cyanosis, clubbing, rash. R and LLE 1+ edema to knees bilaterally. S/p L foot transmetatarsal amputation. R and LLE ace wraps. R and L Knee with foam dressing  Neuro: alert & orientedx3, cranial nerves grossly intact. moves all 4 extremities w/o difficulty. Normal affect.   Telemetry: Personally reviewed, atrial fibrillation 90-100s  Labs: CBC  Recent Labs  12/31/16 0411 01/01/17 0505  WBC 9.8 9.7  HGB 13.4 13.3  HCT 40.4 40.4  MCV 90.2 90.2  PLT 85* 58*   Basic Metabolic Panel  Recent Labs  12/31/16 0411 12/31/16 1834 01/01/17 0505  NA 131* 133* 133*  K 4.7 4.3 4.0  CL 99* 101 98*  CO2 19* 22 24  GLUCOSE 176* 173* 161*  BUN 31* 22* 17  CREATININE 1.91* 1.69* 1.47*  CALCIUM 8.6* 8.7* 8.7*  MG 2.4  --  2.4  PHOS 3.1 2.4* 2.1*   Liver Function Tests  Recent Labs  12/31/16 1834 01/01/17 0505  ALBUMIN 2.5* 2.5*   No results for input(s): LIPASE, AMYLASE in the last 72 hours. Cardiac Enzymes No results for input(s): CKTOTAL, CKMB, CKMBINDEX, TROPONINI in the last 72 hours.  BNP: BNP (last 3 results)  Recent Labs  10/12/16 1026 12/10/16 1431 01/09/2017 2016  BNP 1,200.9* 1,134.8* 706.0*    ProBNP (last 3 results)  Recent Labs  12/10/16 1431  PROBNP 21,500*     D-Dimer No results for input(s): DDIMER in the last 72 hours. Hemoglobin A1C No results for input(s): HGBA1C in the last 72 hours. Fasting Lipid Panel No results for input(s): CHOL, HDL, LDLCALC, TRIG, CHOLHDL, LDLDIRECT in the last 72 hours. Thyroid Function Tests No results for input(s): TSH, T4TOTAL, T3FREE, THYROIDAB in the last 72 hours.  Invalid input(s): FREET3  Other results:     Imaging/Studies:  No results found.    Medications:     Scheduled Medications: . aspirin EC  81 mg Oral Daily  . atorvastatin  40 mg Oral QHS  . Chlorhexidine Gluconate Cloth  6 each Topical Daily  . Gerhardt's  butt cream   Topical BID  . guaiFENesin  600 mg Oral BID  . insulin aspart  0-15 Units Subcutaneous TID WC  . levothyroxine  112.5 mcg Oral QAC breakfast  . mouth rinse  15 mL Mouth Rinse QID  . senna-docusate  1 tablet Oral Daily  . sodium chloride flush  10-40 mL Intracatheter Q12H  . sodium chloride flush  3 mL Intravenous Q12H    Infusions: . amiodarone 30 mg/hr (12/31/16 2018)  . bivalirudin (ANGIOMAX) infusion 0.5 mg/mL (Non-ACS indications) 0.02 mg/kg/hr (12/31/16 0800)  . milrinone 0.375 mcg/kg/min (01/01/17 0114)  . norepinephrine (LEVOPHED) Adult infusion 12 mcg/min (12/31/16 1800)  . dialysis replacement fluid (prismasate) 400 mL/hr at 12/31/16 2209  . dialysis replacement fluid (prismasate) 200 mL/hr at 12/31/16 2203  . dialysate (PRISMASATE) 1,500 mL/hr at 01/01/17 0441    PRN Medications: Place/Maintain arterial line **AND** sodium chloride, acetaminophen (TYLENOL) oral liquid 160 mg/5 mL, ALPRAZolam, fentaNYL (SUBLIMAZE) injection, guaiFENesin-dextromethorphan, hydrOXYzine, levalbuterol, ondansetron **OR** ondansetron (ZOFRAN) IV, sodium chloride, sodium chloride flush   Assessment/Plan   [name] is a 71 y.o. female CAD s/p CABG, chronic combined CHF, moderate aortic stenosis, DM, HLD, PVD followed by Dr. Donnetta Hutching s/p L foot transmetatarsal amputation, hypothyroidism, syncope, CKD stage III-IV, and Iron/B12 deficiency.  Admitted for edema and poor urine output.  Found to be hypotensive and in cardiogenic shock. Also found to be in [place] Afib. ? Infection origin/early + UA, UCx with E. Coli and CXR concerning for PNA.  1. Acute on chronic biventricular CHF: Ischemic cardiomyopathy.  Echo with admission with EF 15%, severely decreased RV systolic function, mid to moderate aortic stenosis.  CVP 18 today.  Pt remains markedly volume overloaded on exam. Co-ox 66%. Cardiac output appears adequate on milrinone 0.375 mcg/kg/min and levophed 12.  She diuresed poorly despite  high dose diuretics, creatinine elevated.  Therefore, CVVH started on 3/17.  UF now at 150 cc/hr.  CVP 13 today. - Continue CVVH per renal.  Has more fluid to remove.  BP is tolerating so far, continue milrinone and norepinephrine support. CVP trending down, 20>13.  - She does not have ICD. Will eventually need consideration with long standing low EF if she recovers from this hospitalization. Unstable currently and with chronic leg wounds so this would be down the road.  - Not good candidate for advanced therapies with RV failure, renal failure, and limited mobility. 2. Atrial fibrillation versus atypical flutter: Remains in Afib but rate stable on amio gtt.  - Continue amio gtt for now.   - Plan eventual TEE/DCCV if she stabilizes.  - Will  need anticoag with CHA2DS2/VASc of 6.  On Bivalirudin with low plts. Hit antibody sent and pending.  3. AKI on CKD stage 3: Baseline creatinine 1.4-1.6.   Suspect component of cardiorenal syndrome but also ATN with hypotensive episode initially.  She is now on CVVH per nephrology to see if volume unloading will help her renal function.  No urine output. She is not a good long-term HD candidate.   4. CAD: No chest pain. Last cath in 12/12 with patent LIMA to LAD with occlusion of distal LAD; High grade stenosis of nondominant RCA; Signif disease of prox LCx; SVG to L PDA occluded, SVG to OM patent).  Attempted PTCA of proximal LCx but failed 10/2011. No good  revascularization options.   - Continue statin, stop ASA with low plts.   5. PAD: Extensive, especially in the left leg.  She is s/p left transmetatarsal amputation.  This has led to her limited mobility. Followed by [facility].  - No change to current plan.  6. UTI: E coli. Has completed aztreonam course.    7. Right pleural effusion:  Could consider thoracentesis eventually.   8. Aortic stenosis: Mild to possibly moderate on echo. No change to current plan.  9. Epistaxis: Resolved, now on bivalirudin gtt (low plts).   Keep O2 humidified.    10. Hyponatremia: Sodium 133 Fluid restrict 1500 cc.  11. Thrombocytopenia: Platelets falling, 102>85>58. Now on bivalirudin. HIT antibody pending. Hold aspirin.      Will need Palliative Care consult pending.    Length of Stay: [place], NP  01/01/2017, 7:12 AM  Advanced Heart Failure Team Pager [phone] (M-F; [place])  Please contact [facility] for night-coverage after hours (4p -7a ) and weekends  Patient seen with NP, agree with the above note.  CVP coming down with CVVH, now 13.  BP and co-ox remain stable.  Continue current support, continue CVVH today.   She is making very little urine.  I am concerned that she may not recover enough native kidney function to avoid ongoing need for HD. She would be a poor intermittent HD candidate.  Awaiting palliative care consult.  If she does not recover her renal function after volume excess is removed, hospice care may be the best option.  This has been discussed with her.   Platelets continue to fall.  On bivalirudin, awaiting HIT antibody.   Loralie Champagne 01/01/2017 7:46 AM

## 2017-01-01 NOTE — Progress Notes (Signed)
Patient ID: [name], female   DOB: [birth year], 71 y.o.   MRN: [id number]  [facility] Progress Note   Assessment/ Plan:   1. Acute kidney injury on chronic kidney disease stage III: Suspect cardiorenal syndrome with superimposed ATN with relative hypotension/cardiogenic shock.  started on CRRT 3/17 after refractoriness to diuretics/inotropic support. Some net negative ultrafiltration noted overnight (-3.3 liters) but CVP remains elevated- remains pressor dependent- is likely not a candidate for chronic HD given cardiomyopathy- pt and family aware.  Continue CRRT no changes to orders for now 2. Acute exacerbation of congestive heart failure: History of ischemic cardiomyopathy with EF of 15%. Started on CRRT after failed response to diuretic/inotropes. Have been able to remove fluid but now pressors causing decreased perfusion of legs 3. Hyponatremia: Secondary to CHF exacerbation and acute on chronic renal insufficiency- somewhat better with ultrafiltration. 4. New onset atrial fibrillation/flutter: Remains rate controlled and currently on amiodarone drip. 5. Elytes- controlled- all 4 K  6. Anemia- not an issue 7. Dispo- palliative care to get involved- I had told them that if nothing positive or negative happens that Friday might be a good day to liberate from CRRT to see what happens 8. Platelets down- heparin stopped on bivalirudin  Subjective:   on CRRT- is running well- able to achieve UF of 3.3 liters. I have gone over the situation with the patient - that if kidneys do not turn around we dont feel candidate for IHD.  She seems to understand and is asking appropriate questions- wanting to get her affairs in order.  No UOP as of yet   Objective:   BP 114/62   Pulse (!) 114   Temp (!) 96.6 F (35.9 C) (Axillary)   Resp 10   Ht 5\' 3"  (1.6 m)   Wt 74.2 kg (163 lb 9.3 oz)   SpO2 94%   BMI 28.98 kg/m   Intake/Output Summary (Last 24 hours) at 01/01/17 0948 Last  data filed at 01/01/17 0900  Gross per 24 hour  Intake           1714.8 ml  Output             4830 ml  Net          -3115.2 ml   Weight change:   Physical Exam: Gen: Appears to be uncomfortable resting in bed with intermittent left leg pain- says feels decent today- left IJ vascath placed 3/17 CVS: Pulse irregularly irregular tachycardia, 3/6 holosystolic murmur, elevated JVD Resp: Coarse breath sounds anteriorly Abd: Soft, obese, nontender Ext: 2+ lower extremity edema with Ace wrap over her extremities - painful and amputations  Imaging: Dg Chest Port 1 View  Result Date: 12/31/2016 CLINICAL DATA:  Respiratory failure . EXAM: PORTABLE CHEST 1 VIEW COMPARISON:  12/29/2016 . FINDINGS: Bilateral IJ lines in stable position. Prior CABG. Diffuse bilateral pulmonary infiltrates/edema and bilateral pleural effusions. Slight worsening from prior exam. No pneumothorax. IMPRESSION: 1.  Bilateral IJ lines in stable position. 2. Prior CABG. Diffuse bilateral pulmonary infiltrates and bilateral pleural effusions. Slight worsening from prior exam. Electronically Signed   By: Marcello Moores  Register   On: 12/31/2016 06:59    Labs: BMET  Recent Labs Lab 12/29/16 0400 12/29/16 1901 12/30/16 0411 12/30/16 1526 12/31/16 0411 12/31/16 1834 01/01/17 0505  NA 125* 123* 126* 130* 131* 133* 133*  K 4.3 4.4 4.4 4.1 4.7 4.3 4.0  CL 93* 92* 93* 100* 99* 101 98*  CO2 20* 18* 21* 19* 19*  22 24  GLUCOSE 74 141* 137* 155* 176* 173* 161*  BUN 88* 84* 62* 40* 31* 22* 17  CREATININE 3.07* 3.20* 2.50* 1.82* 1.91* 1.69* 1.47*  CALCIUM 8.7* 8.1* 8.6* 7.5* 8.6* 8.7* 8.7*  PHOS 5.4* 6.2* 4.5 3.0 3.1 2.4* 2.1*   CBC  Recent Labs Lab 12/26/16 0201  12/29/16 0358 12/30/16 0411 12/31/16 0411 01/01/17 0505  WBC 7.2  < > 8.1 8.9 9.8 9.7  NEUTROABS 5.5  --   --   --   --   --   HGB 15.4*  < > 13.1 13.6 13.4 13.3  HCT 47.3*  < > 38.9 40.0 40.4 40.4  MCV 92.9  < > 89.0 88.5 90.2 90.2  PLT 150  < > 118* 102* 85*  58*  < > = values in this interval not displayed. Medications:    . atorvastatin  40 mg Oral QHS  . Chlorhexidine Gluconate Cloth  6 each Topical Daily  . Gerhardt's butt cream   Topical BID  . guaiFENesin  600 mg Oral BID  . insulin aspart  0-15 Units Subcutaneous TID WC  . levothyroxine  112.5 mcg Oral QAC breakfast  . mouth rinse  15 mL Mouth Rinse QID  . senna-docusate  1 tablet Oral Daily  . sodium chloride flush  10-40 mL Intracatheter Q12H  . sodium chloride flush  3 mL Intravenous Q12H   [name] A  01/01/2017, 9:48 AM

## 2017-01-02 DIAGNOSIS — J9621 Acute and chronic respiratory failure with hypoxia: Secondary | ICD-10-CM

## 2017-01-02 LAB — RENAL FUNCTION PANEL
ALBUMIN: 2.4 g/dL — AB (ref 3.5–5.0)
ANION GAP: 8 (ref 5–15)
ANION GAP: 9 (ref 5–15)
Albumin: 2.4 g/dL — ABNORMAL LOW (ref 3.5–5.0)
BUN: 10 mg/dL (ref 6–20)
BUN: 9 mg/dL (ref 6–20)
CALCIUM: 8.4 mg/dL — AB (ref 8.9–10.3)
CHLORIDE: 99 mmol/L — AB (ref 101–111)
CHLORIDE: 99 mmol/L — AB (ref 101–111)
CO2: 25 mmol/L (ref 22–32)
CO2: 23 mmol/L (ref 22–32)
Calcium: 8.5 mg/dL — ABNORMAL LOW (ref 8.9–10.3)
Creatinine, Ser: 1.3 mg/dL — ABNORMAL HIGH (ref 0.44–1.00)
Creatinine, Ser: 1.27 mg/dL — ABNORMAL HIGH (ref 0.44–1.00)
GFR calc Af Amer: 47 mL/min — ABNORMAL LOW (ref >60)
GFR calc non Af Amer: 40 mL/min — ABNORMAL LOW (ref >60)
GFR, EST AFRICAN AMERICAN: 48 mL/min — AB (ref >60)
GFR, EST NON AFRICAN AMERICAN: 41 mL/min — AB (ref >60)
GLUCOSE: 192 mg/dL — AB (ref 65–99)
Glucose, Bld: 187 mg/dL — ABNORMAL HIGH (ref 65–99)
PHOSPHORUS: 2.9 mg/dL (ref 2.5–4.6)
POTASSIUM: 4.3 mmol/L (ref 3.5–5.1)
Phosphorus: 2.2 mg/dL — ABNORMAL LOW (ref 2.5–4.6)
Potassium: 4 mmol/L (ref 3.5–5.1)
Sodium: 132 mmol/L — ABNORMAL LOW (ref 135–145)
Sodium: 131 mmol/L — ABNORMAL LOW (ref 135–145)

## 2017-01-02 LAB — CBC
HCT: 38.9 % (ref 36.0–46.0)
Hemoglobin: 12.8 g/dL (ref 12.0–15.0)
MCH: 29.9 pg (ref 26.0–34.0)
MCHC: 32.9 g/dL (ref 30.0–36.0)
MCV: 90.9 fL (ref 78.0–100.0)
PLATELETS: 57 10*3/uL — AB (ref 150–400)
RBC: 4.28 MIL/uL (ref 3.87–5.11)
RDW: 17 % — AB (ref 11.5–15.5)
WBC: 9.1 10*3/uL (ref 4.0–10.5)

## 2017-01-02 LAB — COOXEMETRY PANEL
CARBOXYHEMOGLOBIN: 1.3 % (ref 0.5–1.5)
Methemoglobin: 0.9 % (ref 0.0–1.5)
O2 SAT: 71.4 %
Total hemoglobin: 13.2 g/dL (ref 12.0–16.0)

## 2017-01-02 LAB — GLUCOSE, CAPILLARY
GLUCOSE-CAPILLARY: 216 mg/dL — AB (ref 65–99)
GLUCOSE-CAPILLARY: 186 mg/dL — AB (ref 65–99)
Glucose-Capillary: 205 mg/dL — ABNORMAL HIGH (ref 65–99)

## 2017-01-02 LAB — MAGNESIUM: Magnesium: 2.4 mg/dL (ref 1.7–2.4)

## 2017-01-02 LAB — APTT: APTT: 62 s — AB (ref 24–36)

## 2017-01-02 MED ORDER — SODIUM PHOSPHATES 45 MMOLE/15ML IV SOLN
20.0000 mmol | Freq: Once | INTRAVENOUS | Status: AC
  Administered 2017-01-02: 20 mmol via INTRAVENOUS
  Filled 2017-01-02: qty 6.67

## 2017-01-02 NOTE — Progress Notes (Signed)
[place] for Bivalirudin Indication: atrial fibrillation/Rule-out HIT/CRRT  Allergies  Allergen Reactions  . Cephalexin Swelling and Rash  . Ciprofloxacin Swelling and Rash  . Zyvox [Linezolid] Other (See Comments)    Thrombocytopenia developed to 50k after several weeks of therapy  . Adhesive [Tape] Other (See Comments)    "old [employer] adhesive tape; takes my skin off; can use paper tape"  . Bactrim [Sulfamethoxazole-Trimethoprim] Other (See Comments)    Hyperkalemia (elevated potassium) and Increased creatinine  . Daptomycin Other (See Comments)    Had elevated CPK on therapy, not clear that this was due to cubicin  . Vancomycin Other (See Comments)    ? If this played role in her Acute kidney injury    Patient Measurements: Height: 5\' 3"  (160 cm) Weight: 158 lb 8.2 oz (71.9 kg) IBW/kg (Calculated) : 52.4 Heparin Dosing Weight: ~70kg  Vital Signs: Temp: 97.8 F (36.6 C) (03/21 0800) Temp Source: Axillary (03/21 0800) BP: 118/60 (03/21 0315) Pulse Rate: 105 (03/21 1015)  Labs:  Recent Labs  12/31/16 0411 12/31/16 0943  01/01/17 0505 01/01/17 1618 01/02/17 0330  HGB 13.4  --   --  13.3  --  12.8  HCT 40.4  --   --  40.4  --  38.9  PLT 85*  --   --  58*  --  57*  APTT  --  68*  --  71*  --  62*  CREATININE 1.91*  --   < > 1.47* 1.44* 1.30*  < > = values in this interval not displayed.  Estimated Creatinine Clearance: 37.7 mL/min (A) (by C-G formula based on SCr of 1.3 mg/dL (H)).   Assessment: 70yof previously on heparin for afib.  Heparin was on hold on 3/15 and 3/17 with recurrent epistaxis and hematuria. Resumed anticoagulation with bivalirudin on 3/18 due to concern of HIT (heparin antibody pending). Patient is on CRRT and noted DNI.  -aPTT= 62 and at goal  Goal of Therapy:  Goal aPTT: 50-70 per Dr. Aundra Dubin due to bleeding risk Monitor platelets by anticoagulation protocol: Yes   Plan:  1) Continue  bivalirudin 0.02mg /kg/hr 2) Daily aPTT and CBC  Hildred Laser, Pharm D 01/02/2017 10:26 AM

## 2017-01-02 NOTE — Progress Notes (Signed)
Patient ID: [name], female   DOB: 18-Sep-1946, 71 y.o.   MRN: [id number]     Advanced Heart Failure Rounding Note  Referring Physician: Dr Elsworth Soho Primary Physician:Dr. Carlota Raspberry Primary Cardiologist:  Dr. Harrington Challenger  Reason for Consultation: Cardiogenic shock   Subjective:    Developed shock overnight 12/26/16. Central line and Art line placed. Started on dobutamine. AHF team consulted. Echo significant for EF 30% down to 15%.  Coox 71% on Milrinone 0.375 and levophed 12 mcg.  CVP 13. SBP 90s-100s.   Off heparin due to thrombocytopenia, HIT antibody sent and pending. On Bival.  Platelets continue to fall, now 57K.   With very poor diuresis and rising creatinine, she was started on CVVH on 3/17.  Currently UF at 150  cc/hr. ~15 cc urine in the last 24 hours.    Denies SOB. Intermittent leg pain. She has been planning her funeral.   Objective:   Weight Range: 158 lb 8.2 oz (71.9 kg) Body mass index is 28.08 kg/m.   Vital Signs:   Temp:  [97.2 F (36.2 C)-98.3 F (36.8 C)] 97.6 F (36.4 C) (03/21 0400) Pulse Rate:  [102-119] 118 (03/21 0700) Resp:  [9-23] 12 (03/21 0700) BP: (118-119)/(60-62) 118/60 (03/21 0315) SpO2:  [94 %-100 %] 99 % (03/21 0700) Arterial Line BP: (81-130)/(42-65) 107/57 (03/21 0700) Weight:  [158 lb 8.2 oz (71.9 kg)] 158 lb 8.2 oz (71.9 kg) (03/21 0600) Last BM Date: 12/29/16  Weight change: Filed Weights   12/30/16 0500 01/01/17 0700 01/02/17 0600  Weight: 188 lb 15 oz (85.7 kg) 163 lb 9.3 oz (74.2 kg) 158 lb 8.2 oz (71.9 kg)    Intake/Output:   Intake/Output Summary (Last 24 hours) at 01/02/17 0711 Last data filed at 01/02/17 0700  Gross per 24 hour  Intake          1826.32 ml  Output             5274 ml  Net         -3447.68 ml     Physical Exam: CVP 12-13.  General:  Elderly. NAD. In bed. Wearing CPAP. CVVH ongoing.    Neck: Thick. JVP to jaw.   No thyromegaly or nodule noted.  Cor: PMI nondisplaced. Tachy irregular. Rate normal. No  S3/S4. 1/6 SEM RUSB.  Lungs: Crackles at the bases on CPAP.    Abdomen: Obese, distended, edematous. No hepatosplenomegaly. No bruits or masses. +BS Extremities: no cyanosis, clubbing, rash. R and LLE 1+ edema to knees bilaterally. L foot transmetatarsal amputation. R and LLE ace wraps. R and L Knee with foam dressing  Neuro: alert & orientedx3, cranial nerves grossly intact. moves all 4 extremities w/o difficulty. Normal affect.   Telemetry: Personally reviewed, atrial flutter 110s  Labs: CBC  Recent Labs  01/01/17 0505 01/02/17 0330  WBC 9.7 9.1  HGB 13.3 12.8  HCT 40.4 38.9  MCV 90.2 90.9  PLT 58* 57*   Basic Metabolic Panel  Recent Labs  01/01/17 0505 01/01/17 1618 01/02/17 0330  NA 133* 132* 132*  K 4.0 4.2 4.0  CL 98* 101 99*  CO2 24 21* 25  GLUCOSE 161* 192* 192*  BUN _0 CREATININE 1.47* 1.44* 1.30*  CALCIUM 8.7* 8.6* 8.4*  MG 2.4  --  2.4  PHOS 2.1* 1.7* 2.2*   Liver Function Tests  Recent Labs  01/01/17 1618 01/02/17 0330  ALBUMIN 2.4* 2.4*   No results for input(s): LIPASE, AMYLASE in the last 72 hours.  Cardiac Enzymes No results for input(s): CKTOTAL, CKMB, CKMBINDEX, TROPONINI in the last 72 hours.  BNP: BNP (last 3 results)  Recent Labs  10/12/16 1026 12/10/16 1431 01/05/2017 2016  BNP 1,200.9* 1,134.8* 706.0*    ProBNP (last 3 results)  Recent Labs  12/10/16 1431  PROBNP 21,500*     D-Dimer No results for input(s): DDIMER in the last 72 hours. Hemoglobin A1C No results for input(s): HGBA1C in the last 72 hours. Fasting Lipid Panel No results for input(s): CHOL, HDL, LDLCALC, TRIG, CHOLHDL, LDLDIRECT in the last 72 hours. Thyroid Function Tests No results for input(s): TSH, T4TOTAL, T3FREE, THYROIDAB in the last 72 hours.  Invalid input(s): FREET3  Other results:     Imaging/Studies:  No results found.    Medications:     Scheduled Medications: . atorvastatin  40 mg Oral QHS  . Chlorhexidine Gluconate  Cloth  6 each Topical Daily  . Gerhardt's butt cream   Topical BID  . guaiFENesin  600 mg Oral BID  . insulin aspart  0-15 Units Subcutaneous TID WC  . levothyroxine  112.5 mcg Oral QAC breakfast  . mouth rinse  15 mL Mouth Rinse QID  . senna-docusate  1 tablet Oral Daily  . sodium chloride flush  10-40 mL Intracatheter Q12H  . sodium chloride flush  3 mL Intravenous Q12H    Infusions: . amiodarone 30 mg/hr (01/02/17 0514)  . bivalirudin (ANGIOMAX) infusion 0.5 mg/mL (Non-ACS indications) 0.02 mg/kg/hr (01/01/17 2052)  . milrinone 0.375 mcg/kg/min (01/01/17 2314)  . norepinephrine (LEVOPHED) Adult infusion 12 mcg/min (01/02/17 0700)  . dialysis replacement fluid (prismasate) 400 mL/hr at 01/02/17 0008  . dialysis replacement fluid (prismasate) 200 mL/hr at 01/02/17 0005  . dialysate (PRISMASATE) 1,500 mL/hr at 01/02/17 0507    PRN Medications: Place/Maintain arterial line **AND** sodium chloride, acetaminophen (TYLENOL) oral liquid 160 mg/5 mL, ALPRAZolam, fentaNYL (SUBLIMAZE) injection, guaiFENesin-dextromethorphan, hydrOXYzine, levalbuterol, ondansetron **OR** ondansetron (ZOFRAN) IV, sodium chloride, sodium chloride flush   Assessment/Plan   COLBI SCHILTZ is a 71 y.o. female CAD s/p CABG, chronic combined CHF, moderate aortic stenosis, DM, HLD, PVD followed by Dr. Donnetta Hutching s/p L foot transmetatarsal amputation, hypothyroidism, syncope, CKD stage III-IV, and Iron/B12 deficiency.  Admitted for edema and poor urine output.  Found to be hypotensive and in cardiogenic shock. Also found to be in [place] Afib. ? Infection origin/early + UA, UCx with E. Coli and CXR concerning for PNA.  1. Acute on chronic biventricular CHF: Ischemic cardiomyopathy.  Echo with admission with EF 15%, severely decreased RV systolic function, mid to moderate aortic stenosis.  CVP 18 today.  Pt remains markedly volume overloaded on exam. Co-ox 71%. Cardiac output appears adequate on milrinone 0.375 mcg/kg/min and  levophed 12.  She diuresed poorly despite high dose diuretics, creatinine elevated.  Therefore, CVVH started on 3/17.  UF now at 150 cc/hr.  CVP 13 today. - Continue CVVH per renal.  Has more fluid to remove.  BP is tolerating so far, continue milrinone and norepinephrine support. CVP trending down, 13.  - She does not have ICD. Will eventually need consideration with long standing low EF if she recovers from this hospitalization. Unstable currently and with chronic leg wounds so this would be down the road.  - Not good candidate for advanced therapies with RV failure, renal failure, and limited mobility. 2. Atrial fibrillation versus atypical flutter: She appears to be in atrial flutter this morning.   - Continue amio gtt for now.   - Plan  eventual TEE/DCCV if she stabilizes.  - Will need anticoag with CHA2DS2/VASc of 6.  On Bivalirudin with low plts. Hit antibody sent and pending.  3. AKI on CKD stage 3: Baseline creatinine 1.4-1.6.   Suspect component of cardiorenal syndrome but also ATN with hypotensive episode initially.  She is now on CVVH per nephrology to see if volume unloading will help her renal function.  No urine output. She is not a good long-term HD candidate.   4. CAD: No chest pain. Last cath in 12/12 with patent LIMA to LAD with occlusion of distal LAD; High grade stenosis of nondominant RCA; Signif disease of prox LCx; SVG to L PDA occluded, SVG to OM patent).  Attempted PTCA of proximal LCx but failed 10/2011. No good  revascularization options.   - Continue statin, stop ASA with low plts.   5. PAD: Extensive, especially in the left leg.  She is s/p left transmetatarsal amputation.  This has led to her limited mobility. Followed by [facility].  - No change to current plan.  6. UTI: E coli. Has completed aztreonam course.    7. Right pleural effusion:  Could consider thoracentesis eventually.   8. Aortic stenosis: Mild to possibly moderate on echo. No change to current plan.  9. Epistaxis:  Resolved, now on bivalirudin gtt (low plts).  Keep O2 humidified.    10. Hyponatremia: Sodium 132.  11. Thrombocytopenia: Platelets falling, 102>85>58>57. Now on bivalirudin. HIT antibody pending. Hold aspirin.       Palliative Care completed 3/20. Now limited code.     Length of Stay: [place], NP  01/02/2017, 7:11 AM  Advanced Heart Failure Team Pager [phone] (M-F; [place])  Please contact [facility] for night-coverage after hours (4p -7a ) and weekends  Patient seen with NP, agree with the above note.  CVP remains elevated, CVVH is ongoing with good fluid removal.  Minimal urine output.  Plan for now will be to continue CVVH until probably Friday then see how she does off.  She would not be a long-term HD candidate. Palliative care has met with her, now a limited code.   Loralie Champagne 01/02/2017 7:43 AM

## 2017-01-02 NOTE — Progress Notes (Signed)
Daily Progress Note   Patient Name: [name]       Date: 01/02/2017 DOB: [birth year]  Age: 71 y.o. MRN#: [id number] Attending Physician: Larey Dresser, MD Primary Care Physician: [name] Agreste, MD Admit Date: 12/25/2016  Reason for Consultation/Follow-up: Establishing goals of care and Psychosocial/spiritual support r/t end stage heart failure and now with renal failure.   Subjective: [name] is eating lunch and brother at bedside.   Length of Stay: 9  Current Medications: Scheduled Meds:  . atorvastatin  40 mg Oral QHS  . Chlorhexidine Gluconate Cloth  6 each Topical Daily  . Gerhardt's butt cream   Topical BID  . guaiFENesin  600 mg Oral BID  . insulin aspart  0-15 Units Subcutaneous TID WC  . levothyroxine  112.5 mcg Oral QAC breakfast  . mouth rinse  15 mL Mouth Rinse QID  . senna-docusate  1 tablet Oral Daily  . sodium chloride flush  10-40 mL Intracatheter Q12H  . sodium chloride flush  3 mL Intravenous Q12H  . sodium phosphate  Dextrose 5% IVPB  20 mmol Intravenous Once    Continuous Infusions: . amiodarone 30 mg/hr (01/02/17 0800)  . bivalirudin (ANGIOMAX) infusion 0.5 mg/mL (Non-ACS indications) 0.02 mg/kg/hr (01/02/17 0800)  . milrinone 0.375 mcg/kg/min (01/02/17 0832)  . norepinephrine (LEVOPHED) Adult infusion 16 mcg/min (01/02/17 1200)  . dialysis replacement fluid (prismasate) 400 mL/hr at 01/02/17 1253  . dialysis replacement fluid (prismasate) 200 mL/hr at 01/02/17 0005  . dialysate (PRISMASATE) 1,500 mL/hr at 01/02/17 1213    PRN Meds: Place/Maintain arterial line **AND** sodium chloride, acetaminophen (TYLENOL) oral liquid 160 mg/5 mL, ALPRAZolam, fentaNYL (SUBLIMAZE) injection, guaiFENesin-dextromethorphan, hydrOXYzine, levalbuterol,  ondansetron **OR** ondansetron (ZOFRAN) IV, sodium chloride, sodium chloride flush  Physical Exam  Constitutional: She is oriented to person, place, and time. She appears well-developed.  HENT:  Head: Normocephalic and atraumatic.  Cardiovascular: Tachycardia present.   Pulmonary/Chest: Effort normal. No accessory muscle usage. No tachypnea. No respiratory distress.  Abdominal: Normal appearance.  Neurological: She is alert and oriented to person, place, and time.  Nursing note and vitals reviewed.           Vital Signs: BP 118/60   Pulse (!) 107   Temp 97.4 F (36.3 C) (Oral)   Resp 18   Ht 5\' 3"  (1.6 m)  Wt 71.9 kg (158 lb 8.2 oz)   SpO2 96%   BMI 28.08 kg/m  SpO2: SpO2: 96 % O2 Device: O2 Device: Bi-PAP O2 Flow Rate: O2 Flow Rate (L/min): 4 L/min  Intake/output summary:  Intake/Output Summary (Last 24 hours) at 01/02/17 1515 Last data filed at 01/02/17 1500  Gross per 24 hour  Intake          1809.31 ml  Output             4781 ml  Net         -2971.69 ml   LBM: Last BM Date: 12/29/16 Baseline Weight: Weight: 72.1 kg (158 lb 15.2 oz) Most recent weight: Weight: 71.9 kg (158 lb 8.2 oz)       Palliative Assessment/Data:    Flowsheet Rows     Most Recent Value  Intake Tab  Referral Department  Cardiology  Unit at Time of Referral  ICU  Palliative Care Primary Diagnosis  Cardiac  Date Notified  12/31/16  Palliative Care Type  New Palliative care  Reason for referral  Clarify Goals of Care  Date of Admission  01/02/2017  # of days IP prior to Palliative referral  7  Clinical Assessment  Psychosocial & Spiritual Assessment  Palliative Care Outcomes      Patient Active Problem List   Diagnosis Date Noted  . Acute on chronic respiratory failure with hypoxemia ([place])   . Acute renal failure superimposed on stage 2 chronic kidney disease ([place])   . Palliative care encounter   . Acute respiratory failure with hypoxia ([place]) 12/29/2016  . Shock ([place])   . Acute  on chronic systolic congestive heart failure ([place])   . Atypical atrial flutter ([place])   . Hyponatremia 01/10/2017  . Acute renal failure (ARF) ([place]) 12/21/2016  . CAP (community acquired pneumonia) 01/02/2017  . DM type 1 causing renal disease ([place]) 12/28/2016  . Cellulitis 12/13/2016  . Microcytosis 07/05/2016  . PAD (peripheral artery disease) ([place]) 03/23/2016  . Status post transmetatarsal amputation of left foot ([place]) 03/21/2016  . Pain in limb 03/02/2014  . Skin tear 11/17/2013  . PVD (peripheral vascular disease) ([place]) 09/22/2013  . Goals of care, counseling/discussion 06/09/2013  . Atherosclerosis of native arteries of the extremities with ulceration(440.23) 06/09/2013  . 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 Diabetic midfoot ulcer in type 1 diabetes mellitus ([place]) 05/22/2013  . Abnormality of gait 01/21/2013  . CKD (chronic kidney disease), stage III 11/29/2012  . AKI (acute kidney injury) ([place]) 09/04/2012  . Chronic combined systolic and diastolic heart failure ([place]) 04/24/2012  . Health care maintenance 04/24/2012  . Iron deficiency anemia 04/15/2012  . Vitamin B12 deficiency (dietary) anemia 04/15/2012  . Aortic stenosis 08/22/2011  . ACQUIRED HEMOLYTIC ANEMIA UNSPECIFIED 06/06/2009  . Coronary atherosclerosis 02/03/2009  . CAROTID ARTERY STENOSIS, WITHOUT INFARCTION 02/03/2009  . Peripheral artery disease 02/03/2009  . Unspecified hypothyroidism 02/01/2009  . Dyslipidemia 02/01/2009  . Essential hypertension 02/01/2009  . DM (diabetes mellitus) type I uncontrolled with renal manifestation ([place]) 10/15/1949    Palliative Care Assessment & Plan   HPI: 71 y.o. female  with past medical history of systolic and diastolic combined heart failure (EF 25-30% 07/2015), CAD s/p CABG, diabetes, HLD, PVD s/p left foot transmetatarsal amputation, CKD stage III-IV iron/B12 deficiency admitted on 12/28/2016 with decreased urine output r/t worsening CHF EF 15% and likely now  cardiorenal syndrome. Likely approaching EOL so palliative care consulted for [place] and support.   Assessment: I spoke  more with [name] today. She tells me that she has made a little bit of urine which is new. She admits that she is hopeful from this but also that she knows it is not enough. Her brother comments that it would truly be a miracle. I believe they do understand well that there is an extremely slim chance she will survive this. She then says that she is just hoping this may improve enough to at least give her a few more days.   She continues to work on settling her estate and is looking forward to visiting with an old friend that is coming to visit this afternoon. She continues to try and distract herself and denies feeling any depression or fear currently. I gave her permission to allow herself to have this feelings if she does and that this would only be natural to have those fears. Emotional support provided. I will continue to follow. Will plan to readdress code status Thursday or Friday as well as introduce idea of hospice facility as well.   Recommendations/Plan:  Leg spasms: Fentanyl 50 mcg every 4 hours prn - she says this relieves her spasms well.    Code Status:  Limited code no intubation  Prognosis:   Likely days to week when no more dialysis, continued renal failure, and comfort care.   Discharge Planning:  To Be Determined   Thank you for allowing the Palliative Medicine Team to assist in the care of this patient.   Total Time 107min Prolonged Time Billed  no       Greater than 50%  of this time was spent counseling and coordinating care related to the above assessment and plan.  Vinie Sill, NP Palliative Medicine Team Pager # [phone] (M-F 8a-5p) Team Phone # [phone] (Nights/Weekends)

## 2017-01-02 NOTE — Progress Notes (Signed)
Patient ID: National PAONE, female   DOB: 07-May-1946, 71 y.o.   MRN: [id number]  [facility] Progress Note   Assessment/ Plan:   1. Acute kidney injury on chronic kidney disease stage III: Suspect cardiorenal syndrome with superimposed ATN with relative hypotension/cardiogenic shock.  started on CRRT 3/17 after refractoriness to diuretics/inotropic support. Some net negative ultrafiltration noted overnight (-3.4 liters) CVP lower at 13- remains pressor dependent- is likely not a candidate for chronic HD given cardiomyopathy- pt and family aware.  Continue CRRT but will decrease amount taken off 2. Acute exacerbation of congestive heart failure: History of ischemic cardiomyopathy with EF of 15%. Started on CRRT after failed response to diuretic/inotropes. Have been able to remove fluid but also pressors  3. Hyponatremia: Secondary to CHF exacerbation and acute on chronic renal insufficiency- somewhat better with ultrafiltration. 4. New onset atrial fibrillation/flutter: Remains rate controlled and currently on amiodarone drip. 5. Elytes- controlled- all 4 K - will give phos 6. Anemia- not an issue 7. Dispo- palliative care to get involved- I had told them that if nothing positive or negative happens that Friday might be a good day to liberate from CRRT to see what happens 8. Platelets down- heparin stopped on bivalirudin  Subjective:   on CRRT- is running well- able to achieve UF of 3.4 liters. I have gone over the situation with the patient - that if kidneys do not turn around we dont feel candidate for IHD.  She seems to understand and is asking appropriate questions- wanting to get her affairs in order.  Only 15 of UOP   Objective:   BP 118/60   Pulse 100   Temp 97.8 F (36.6 C) (Axillary)   Resp 14   Ht 5\' 3"  (1.6 m)   Wt 71.9 kg (158 lb 8.2 oz)   SpO2 95%   BMI 28.08 kg/m   Intake/Output Summary (Last 24 hours) at 01/02/17 0932 Last data filed at 01/02/17 0900  Gross per 24 hour  Intake          1722.07 ml  Output             5319 ml  Net         -3596.93 ml   Weight change: -2.3 kg (-5 lb 1.1 oz)  Physical Exam: Gen: Appears to be uncomfortable resting in bed with intermittent left leg pain- says feels decent today- left IJ vascath placed 3/17 CVS: Pulse irregularly irregular tachycardia, 3/6 holosystolic murmur, elevated JVD Resp: Coarse breath sounds anteriorly Abd: Soft, obese, nontender Ext: 2+ lower extremity edema with Ace wrap over her extremities - painful and amputations  Imaging: No results found.  Labs: BMET  Recent Labs Lab 12/30/16 0411 12/30/16 1526 12/31/16 0411 12/31/16 1834 01/01/17 0505 01/01/17 1618 01/02/17 0330  NA 126* 130* 131* 133* 133* 132* 132*  K 4.4 4.1 4.7 4.3 4.0 4.2 4.0  CL 93* 100* 99* 101 98* 101 99*  CO2 21* 19* 19* 22 24 21* 25  GLUCOSE 137* 155* 176* 173* 161* 192* 192*  BUN 62* 40* 31* 22* 17 14 10   CREATININE 2.50* 1.82* 1.91* 1.69* 1.47* 1.44* 1.30*  CALCIUM 8.6* 7.5* 8.6* 8.7* 8.7* 8.6* 8.4*  PHOS 4.5 3.0 3.1 2.4* 2.1* 1.7* 2.2*   CBC  Recent Labs Lab 12/30/16 0411 12/31/16 0411 01/01/17 0505 01/02/17 0330  WBC 8.9 9.8 9.7 9.1  HGB 13.6 13.4 13.3 12.8  HCT 40.0 40.4 40.4 38.9  MCV 88.5 90.2 90.2 90.9  PLT 102* 85* 58* 57*   Medications:    . atorvastatin  40 mg Oral QHS  . Chlorhexidine Gluconate Cloth  6 each Topical Daily  . Gerhardt's butt cream   Topical BID  . guaiFENesin  600 mg Oral BID  . insulin aspart  0-15 Units Subcutaneous TID WC  . levothyroxine  112.5 mcg Oral QAC breakfast  . mouth rinse  15 mL Mouth Rinse QID  . senna-docusate  1 tablet Oral Daily  . sodium chloride flush  10-40 mL Intracatheter Q12H  . sodium chloride flush  3 mL Intravenous Q12H   Cloe Sockwell A  01/02/2017, 9:32 AM

## 2017-01-02 NOTE — Progress Notes (Addendum)
PULMONARY / CRITICAL CARE MEDICINE   Name: [name] MRN: [id number] DOB: 01-19-1946    ADMISSION DATE:  12/16/2016 CONSULTATION DATE:  12/26/16  REFERRING MD:  Maylene Roes  CHIEF COMPLAINT:  Hypotension  HISTORY OF PRESENT ILLNESS:  Pt is encephelopathic; therefore, this HPI is obtained from chart review. [name] is a 71 y.o. female with PMH as outlined below. She was admitted 01/07/2017 with decreased UOP.  She had called cardiology prior to coming to ED who had instructed her to increase her torsemide.  She was found to have AoCKD and hypovolemia.  She was seen by cardiology and nephrology 3/13.  Diuretics were held and she had improvement in renal function but cardiology felt she was developing volume overload.  On 3/13, she also developed new onset A.fib with RVR for which she was started on amiodarone and IV heparin.  Heart failure team was asked to see and were planning to see 12/26/16.  During early AM hours 3/14, she developed worsening hypotension.  Of note, she had received 12.5mg  carvedilol at 1747 and 30mg  Imdur at 2211 the evening prior.  TRH evaluated pt at bedside and ordered 748ml NS bolus with improvement in SBP to mid 80's.  She was also started on dobutamine.  PCCM was subsequently asked to see and transfer to ICU for further evaluation and management.  Of note, she had echo 3/13 which demonstrated EF 15%, mild AS and AR, mild MR, mod TR.  Previous echo Oct 2016 had EF 25-30% with G2DD.  SUBJECTIVE:  No events overnight, does not feel like she needs BiPAP  VITAL SIGNS: BP 118/60   Pulse 100   Temp 97.8 F (36.6 C) (Axillary)   Resp 14   Ht 5\' 3"  (1.6 m)   Wt 71.9 kg (158 lb 8.2 oz)   SpO2 95%   BMI 28.08 kg/m   HEMODYNAMICS: CVP:  [13 mmHg-17 mmHg] 13 mmHg  VENTILATOR SETTINGS:    INTAKE / OUTPUT: I/O last 3 completed shifts: In: 2461.1 [P.O.:390; I.V.:1791.6; IV Piggyback:279.5] Out: 2355 [Urine:15; Other:7775]  PHYSICAL EXAMINATION: GEN :  Chronically ill appearing , on O2 in bed, resting, in NAD Neuro : Awake/alert, f/c commands, moving all ext to command HEENT : [place]/AT, PERRL, EOM-I and MMM CARD: IRIR, Nl S1/S2, -M/R/G. Lungs: Bibasilar crackles but improving compared to yesterday ABD : Soft, NT, ND and +BS Musculoskeletal: hx of left ft amputation Skin : Chronic leg wounds/dsg   LABS:  BMET  Recent Labs Lab 01/01/17 0505 01/01/17 1618 01/02/17 0330  NA 133* 132* 132*  K 4.0 4.2 4.0  CL 98* 101 99*  CO2 24 21* 25  BUN 17 14 10   CREATININE 1.47* 1.44* 1.30*  GLUCOSE 161* 192* 192*   Electrolytes  Recent Labs Lab 12/31/16 0411  01/01/17 0505 01/01/17 1618 01/02/17 0330  CALCIUM 8.6*  < > 8.7* 8.6* 8.4*  MG 2.4  --  2.4  --  2.4  PHOS 3.1  < > 2.1* 1.7* 2.2*  < > = values in this interval not displayed.  CBC  Recent Labs Lab 12/31/16 0411 01/01/17 0505 01/02/17 0330  WBC 9.8 9.7 9.1  HGB 13.4 13.3 12.8  HCT 40.4 40.4 38.9  PLT 85* 58* 57*   Coag's  Recent Labs Lab 12/29/16 1144  12/31/16 0943 01/01/17 0505 01/02/17 0330  APTT  --   < > 68* 71* 62*  INR 1.20  --   --   --   --   < > =  values in this interval not displayed.  Sepsis Markers  Recent Labs Lab 12/29/16 1247  LATICACIDVEN 1.3   ABG  Recent Labs Lab 12/29/16 1301  PHART 7.345*  PCO2ART 31.5*  PO2ART 60.0*   Liver Enzymes  Recent Labs Lab 01/01/17 0505 01/01/17 1618 01/02/17 0330  ALBUMIN 2.5* 2.4* 2.4*   Cardiac Enzymes No results for input(s): TROPONINI, PROBNP in the last 168 hours.  Glucose  Recent Labs Lab 12/31/16 2218 01/01/17 0813 01/01/17 1133 01/01/17 1613 01/01/17 2318 01/02/17 0813  GLUCAP 159* 168* 231* 201* 189* 205*   Imaging I reviewed CXR myself, pulmonary edema noted  STUDIES:  Echo 3/13 > EF 15%, mild AS and AR, mild MR, mod TR. V/Q 3/14 > low prob  CULTURES: Blood 3/14 > Urine 3/12 > E coli > R to unasyn, S otherwise; ESBL negative  ANTIBIOTICS: Aztreonam 3/12 >  3/13; 3/14 >>  Doxy 3/12 > 3/13   SIGNIFICANT EVENTS: 3/12 > admit. 3/14 > new hypotension > transfer to ICU > started on dobutamine. 3/17 >HD cath placed, CVVH started. Hypotension >Levophed started , BIPAP   LINES/TUBES: CVL R IJ 3/14>>> L IJ Trialysis catheter 3/17>>>  DISCUSSION: 71 y.o. female with hx CKD, combined heart failure, admitted 3/12 with oliguria and decompensated heart failure.  Developed hypotension early AM 3/14. E coli UTI.  Worsening renal failure with decompensated CHF , CVVHD started 3/17 .   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 Hypotension - likely due to above + BP meds + possible sepsis due to UTI New onset A.Fib RVR - started on amio and heparin 3/13. Hx PVD, HTN, HLD, CAD s/p CABG 1993, AS. CVVH started 3/17  P:  - Milrinone + norepi, titrate for BP, pressor demand decreasing, levo at 12 and milrinone 0.375 - Follow SvO2 - Anticoag per cardiology plans-angiomax started 3/18  - Continue amiodarone - ASA, lipitor - CVVHD as per Renal -50 - Titrate pressors for MAP goal >65.  PULMONARY A: Acute hypoxic respiratory failure, likely due to pulm edema Worsening right sided effusion on CXR that I reviewed myself. P:   - Pulmonary hygiene,  - Continue rx for her CHF - D/C BiPAP - DNI status after discussion - [place] only if respiratory status deteriorates  RENAL A:   AoCKD, progressive, consistent with ATN and cardiorenal syndrome; S Cr appears to be plateauing 3/16 Hyponatremia - due to above. P:   - CVVHD started 3/17 per Renal - Follow UOP and BMP - Continue CRRT per renal -50 ml/hr - Replace electrolytes as indicated   GASTROINTESTINAL A:   GERD. Nutrition. P:   - Diet as ordered  HEMATOLOGIC A: Hemoconcentration.   VTE Prophylaxis. P:  - Follow CBC - Transfuse per ICU protocol  INFECTIOUS A:   UTI - E coli R to PCN's (no  ESBL) Cellulitis of LE's - s/p treatment with doxy. P:   - Observe off abx - Wound care following   ENDOCRINE A:   DM. Hypothyroidism. BS trending down , Levemir d/c 3/17  P:   - SSI - Synthroid PO, noted T4  NEUROLOGIC A:   Hx anxiety P:   - Follow, on home xanax  Family updated: Patient updated bedside, will d/c BiPAP, titrate O2, continue to work with palliative care.  Discussed with cards-NP, patient is now DNI, will adjust pressors and will sign off, please call back if needed.  Interdisciplinary Family Meeting v Palliative Care Meeting:  Due by: 01/01/17.  The patient is critically ill with multiple organ systems failure and requires high complexity decision making for assessment and support, frequent evaluation and titration of therapies, application of advanced monitoring technologies and extensive interpretation of multiple databases.   Critical Care Time devoted to patient care services described in this note is  35  Minutes. This time reflects time of care of this signee Dr Jennet Maduro. This critical care time does not reflect procedure time, or teaching time or supervisory time of PA/NP/Med student/Med Resident etc but could involve care discussion time.  Rush Farmer, M.D. [facility] Pulmonary/Critical Care Medicine. Pager: [phone]. After hours pager: [phone].  01/02/2017

## 2017-01-03 ENCOUNTER — Ambulatory Visit: Payer: Medicare Other

## 2017-01-03 LAB — COOXEMETRY PANEL
CARBOXYHEMOGLOBIN: 1.3 % (ref 0.5–1.5)
Methemoglobin: 1 % (ref 0.0–1.5)
O2 Saturation: 41.7 %
Total hemoglobin: 13.1 g/dL (ref 12.0–16.0)

## 2017-01-03 LAB — MAGNESIUM: Magnesium: 2.4 mg/dL (ref 1.7–2.4)

## 2017-01-03 LAB — CBC
HCT: 40.5 % (ref 36.0–46.0)
Hemoglobin: 13 g/dL (ref 12.0–15.0)
MCH: 29.8 pg (ref 26.0–34.0)
MCHC: 32.1 g/dL (ref 30.0–36.0)
MCV: 92.9 fL (ref 78.0–100.0)
PLATELETS: 77 10*3/uL — AB (ref 150–400)
RBC: 4.36 MIL/uL (ref 3.87–5.11)
RDW: 17.1 % — ABNORMAL HIGH (ref 11.5–15.5)
WBC: 7.8 10*3/uL (ref 4.0–10.5)

## 2017-01-03 LAB — RENAL FUNCTION PANEL
ALBUMIN: 2.3 g/dL — AB (ref 3.5–5.0)
ALBUMIN: 2.4 g/dL — AB (ref 3.5–5.0)
ANION GAP: 8 (ref 5–15)
Anion gap: 14 (ref 5–15)
BUN: 9 mg/dL (ref 6–20)
BUN: 10 mg/dL (ref 6–20)
CALCIUM: 8.4 mg/dL — AB (ref 8.9–10.3)
CO2: 21 mmol/L — ABNORMAL LOW (ref 22–32)
CO2: 22 mmol/L (ref 22–32)
CREATININE: 1.42 mg/dL — AB (ref 0.44–1.00)
Calcium: 8.5 mg/dL — ABNORMAL LOW (ref 8.9–10.3)
Chloride: 97 mmol/L — ABNORMAL LOW (ref 101–111)
Chloride: 100 mmol/L — ABNORMAL LOW (ref 101–111)
Creatinine, Ser: 1.39 mg/dL — ABNORMAL HIGH (ref 0.44–1.00)
GFR calc Af Amer: 42 mL/min — ABNORMAL LOW (ref >60)
GFR calc Af Amer: 43 mL/min — ABNORMAL LOW (ref >60)
GFR calc non Af Amer: 37 mL/min — ABNORMAL LOW (ref >60)
GFR, EST NON AFRICAN AMERICAN: 36 mL/min — AB (ref >60)
GLUCOSE: 197 mg/dL — AB (ref 65–99)
Glucose, Bld: 207 mg/dL — ABNORMAL HIGH (ref 65–99)
PHOSPHORUS: 3.1 mg/dL (ref 2.5–4.6)
PHOSPHORUS: 2.2 mg/dL — AB (ref 2.5–4.6)
Potassium: 4.3 mmol/L (ref 3.5–5.1)
Potassium: 4.2 mmol/L (ref 3.5–5.1)
SODIUM: 132 mmol/L — AB (ref 135–145)
SODIUM: 130 mmol/L — AB (ref 135–145)

## 2017-01-03 LAB — GLUCOSE, CAPILLARY
GLUCOSE-CAPILLARY: 205 mg/dL — AB (ref 65–99)
GLUCOSE-CAPILLARY: 125 mg/dL — AB (ref 65–99)
Glucose-Capillary: 239 mg/dL — ABNORMAL HIGH (ref 65–99)
Glucose-Capillary: 193 mg/dL — ABNORMAL HIGH (ref 65–99)

## 2017-01-03 LAB — APTT: aPTT: 73 seconds — ABNORMAL HIGH (ref 24–36)

## 2017-01-03 MED ORDER — MILRINONE LACTATE IN DEXTROSE 20-5 MG/100ML-% IV SOLN
0.3750 ug/kg/min | INTRAVENOUS | Status: DC
  Administered 2017-01-03 – 2017-01-04 (×4): 0.375 ug/kg/min via INTRAVENOUS
  Filled 2017-01-03 (×4): qty 100

## 2017-01-03 MED ORDER — SODIUM CHLORIDE 0.9 % IV SOLN
0.0200 mg/kg/h | INTRAVENOUS | Status: DC
  Administered 2017-01-03 – 2017-01-05 (×3): 0.02 mg/kg/h via INTRAVENOUS
  Filled 2017-01-03 (×3): qty 250

## 2017-01-03 MED ORDER — INSULIN DETEMIR 100 UNIT/ML ~~LOC~~ SOLN
4.0000 [IU] | Freq: Every day | SUBCUTANEOUS | Status: DC
  Filled 2017-01-03: qty 0.04

## 2017-01-03 MED ORDER — INSULIN DETEMIR 100 UNIT/ML ~~LOC~~ SOLN
4.0000 [IU] | Freq: Every day | SUBCUTANEOUS | Status: DC
  Administered 2017-01-04: 4 [IU] via SUBCUTANEOUS
  Filled 2017-01-03 (×3): qty 0.04

## 2017-01-03 NOTE — Progress Notes (Signed)
Daily Progress Note   Patient Name: [name]       Date: 01/03/2017 DOB: 19-Mar-1946  Age: 71 y.o. MRN#: [id number] Attending Physician: [name] Dresser, MD Primary Care Physician: [name] Agreste, MD Admit Date: 01/12/2017  Reason for Consultation/Follow-up: Establishing goals of care and Psychosocial/spiritual support r/t end stage heart failure and now with renal failure.   Subjective: [name] is still awake and alert and working on planning her estate with her nephew, [name], at bedside.   Length of Stay: 10  Current Medications: Scheduled Meds:  . atorvastatin  40 mg Oral QHS  . Chlorhexidine Gluconate Cloth  6 each Topical Daily  . Gerhardt's butt cream   Topical BID  . guaiFENesin  600 mg Oral BID  . insulin aspart  0-15 Units Subcutaneous TID WC  . levothyroxine  112.5 mcg Oral QAC breakfast  . mouth rinse  15 mL Mouth Rinse QID  . senna-docusate  1 tablet Oral Daily  . sodium chloride flush  10-40 mL Intracatheter Q12H  . sodium chloride flush  3 mL Intravenous Q12H    Continuous Infusions: . amiodarone 30 mg/hr (01/03/17 0838)  . bivalirudin (ANGIOMAX) infusion 0.5 mg/mL (Non-ACS indications) 0.02 mg/kg/hr (01/03/17 1030)  . milrinone 0.375 mcg/kg/min (01/03/17 0835)  . norepinephrine (LEVOPHED) Adult infusion 38 mcg/min (01/03/17 0924)  . dialysis replacement fluid (prismasate) 400 mL/hr at 01/03/17 0141  . dialysis replacement fluid (prismasate) 200 mL/hr at 01/03/17 0141  . dialysate (PRISMASATE) 1,500 mL/hr at 01/03/17 1134    PRN Meds: Place/Maintain arterial line **AND** sodium chloride, acetaminophen (TYLENOL) oral liquid 160 mg/5 mL, ALPRAZolam, fentaNYL (SUBLIMAZE) injection, guaiFENesin-dextromethorphan, hydrOXYzine, levalbuterol, ondansetron  **OR** ondansetron (ZOFRAN) IV, sodium chloride, sodium chloride flush  Physical Exam  Constitutional: She is oriented to person, place, and time. She appears well-developed.  HENT:  Head: Normocephalic and atraumatic.  Cardiovascular: Tachycardia present.   Pulmonary/Chest: Effort normal. No accessory muscle usage. No tachypnea. No respiratory distress.  Abdominal: Normal appearance.  Neurological: She is alert and oriented to person, place, and time.  Nursing note and vitals reviewed.           Vital Signs: BP 110/73 (BP Location: Right Arm)   Pulse (!) 111   Temp 97.6 F (36.4 C) (Axillary)   Resp 15   Ht '5\' 3"'  (1.6  m)   Wt 70.1 kg (154 lb 8.7 oz)   SpO2 92%   BMI 27.38 kg/m  SpO2: SpO2: 92 % O2 Device: O2 Device: Nasal Cannula O2 Flow Rate: O2 Flow Rate (L/min): 2 L/min  Intake/output summary:   Intake/Output Summary (Last 24 hours) at 01/03/17 1135 Last data filed at 01/03/17 1100  Gross per 24 hour  Intake          1671.04 ml  Output             2490 ml  Net          -818.96 ml   LBM: Last BM Date: 12/30/16 Baseline Weight: Weight: 72.1 kg (158 lb 15.2 oz) Most recent weight: Weight: 70.1 kg (154 lb 8.7 oz)       Palliative Assessment/Data:    Flowsheet Rows     Most Recent Value  Intake Tab  Referral Department  Cardiology  Unit at Time of Referral  ICU  Palliative Care Primary Diagnosis  Cardiac  Date Notified  12/31/16  Palliative Care Type  New Palliative care  Reason for referral  Clarify Goals of Care  Date of Admission  12/20/2016  # of days IP prior to Palliative referral  7  Clinical Assessment  Psychosocial & Spiritual Assessment  Palliative Care Outcomes      Patient Active Problem List   Diagnosis Date Noted  . Acute on chronic respiratory failure with hypoxemia ([place])   . Acute renal failure superimposed on stage 2 chronic kidney disease ([place])   . Palliative care encounter   . Acute respiratory failure with hypoxia ([place]) 12/29/2016    . Shock ([place])   . Acute on chronic systolic congestive heart failure ([place])   . Atypical atrial flutter ([place])   . Hyponatremia 12/22/2016  . Acute renal failure (ARF) ([place]) 12/21/2016  . CAP (community acquired pneumonia) 12/19/2016  . DM type 1 causing renal disease ([place]) 12/25/2016  . Cellulitis 01/09/2017  . Microcytosis 07/05/2016  . PAD (peripheral artery disease) ([place]) 03/23/2016  . Status post transmetatarsal amputation of left foot ([place]) 03/21/2016  . Pain in limb 03/02/2014  . Skin tear 11/17/2013  . PVD (peripheral vascular disease) ([place]) 09/22/2013  . Goals of care, counseling/discussion 06/09/2013  . Atherosclerosis of native arteries of the extremities with ulceration(440.23) 06/09/2013  . Diabetic osteomyelitis ([place]) 06/02/2013  . 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 Abnormality of gait 01/21/2013  . CKD (chronic kidney disease), stage III 11/29/2012  . AKI (acute kidney injury) ([place]) 09/04/2012  . Chronic combined systolic and diastolic heart failure ([place]) 04/24/2012  . Health care maintenance 04/24/2012  . Iron deficiency anemia 04/15/2012  . Vitamin B12 deficiency (dietary) anemia 04/15/2012  . Aortic stenosis 08/22/2011  . ACQUIRED HEMOLYTIC ANEMIA UNSPECIFIED 06/06/2009  . Coronary atherosclerosis 02/03/2009  . CAROTID ARTERY STENOSIS, WITHOUT INFARCTION 02/03/2009  . Peripheral artery disease 02/03/2009  . Unspecified hypothyroidism 02/01/2009  . Dyslipidemia 02/01/2009  . Essential hypertension 02/01/2009  . DM (diabetes mellitus) type I uncontrolled with renal manifestation ([place]) 10/15/1949    Palliative Care Assessment & Plan   HPI: 71 y.o. female  with past medical history of systolic and diastolic combined heart failure (EF 25-30% 07/2015), CAD s/p CABG, diabetes, HLD, PVD s/p left foot transmetatarsal amputation, CKD stage III-IV iron/B12 deficiency admitted on 01/11/2017 with decreased urine output r/t worsening CHF  EF 15% and likely now cardiorenal syndrome. Likely approaching EOL so palliative care consulted for [name]  and support.   Assessment: I met with [name] and [name] at bedside. She is struggling to understand a time limit for CVVH. We discussed her increasing need of vasopressor support and that is an indication that her body is not tolerating CVVH as well. It really is a matter of how long her body is going to tolerate all these interventions. She seems a little shocked by this but understands. With this said we also discussed the limited benefit of any resuscitation (not only intubation) and she has decided on a DNR.   -D/C CVVH Saturday (her goal is to get her affairs in order) and will not escalate care at this point - Titrate infusions off when she is suffering or no longer able to have meaningful conversations/time with her family  Recommendations/Plan:  Leg spasms: Fentanyl 50 mcg every 4 hours prn - she says this relieves her spasms well.    Code Status:  DNR  Prognosis:   Likely days to week when no more dialysis, continued renal failure, and comfort care.   Discharge Planning:  To Be Determined   Thank you for allowing the Palliative Medicine Team to assist in the care of this patient.   Total Time 86mn Prolonged Time Billed  no       Greater than 50%  of this time was spent counseling and coordinating care related to the above assessment and plan.  AVinie Sill NP Palliative Medicine Team Pager # [phone](M-F 8a-5p) Team Phone # [phone](Nights/Weekends)

## 2017-01-03 NOTE — Progress Notes (Signed)
Inpatient Diabetes Program Recommendations  AACE/ADA: New Consensus Statement on Inpatient Glycemic Control (2015)  Target Ranges:  Prepandial:   less than 140 mg/dL      Peak postprandial:   less than 180 mg/dL (1-2 hours)      Critically ill patients:  140 - 180 mg/dL   Lab Results  Component Value Date   GLUCAP 239 (H) 01/03/2017   HGBA1C 11.5 (H) 12/25/2016    Review of Glycemic ControlResults for [name], [name] (MRN [id number]) as of 01/03/2017 16:05  Ref. Range 01/02/2017 08:13 01/02/2017 11:32 01/02/2017 16:44 01/03/2017 08:11 01/03/2017 11:50  Glucose-Capillary Latest Ref Range: 65 - 99 mg/dL 205 (H) 216 (H) 186 (H) 205 (H) 239 (H)   Diabetes history: Type 1 diabetes Current orders for Inpatient glycemic control:  Novolog moderate tid with meals  Inpatient Diabetes Program Recommendations:    Consider adding Levemir 4 units daily.  Called and discussed with Darrick Grinder, NP.  Orders received.   Thanks, Adah Perl, RN, BC-ADM Inpatient Diabetes Coordinator Pager [phone] (8a-5p)

## 2017-01-03 NOTE — Progress Notes (Signed)
Patient ID: [name], female   DOB: 07-Sep-1946, 71 y.o.   MRN: [id number]     Advanced Heart Failure Rounding Note  Referring Physician: Dr Elsworth Soho Primary Physician:Dr. Carlota Raspberry Primary Cardiologist:  Dr. Harrington Challenger  Reason for Consultation: Cardiogenic shock   Subjective:    Developed shock overnight 12/26/16. Central line and Art line placed. Started on dobutamine. AHF team consulted. Echo significant for EF 30% down to 15%.  Off heparin due to thrombocytopenia, HIT antibody sent and pending. On Bival.  Platelets trending up to 77.   UF at 50  cc/hr. Over night norepi increased to 38 mcg. Continued on milrinone 0.375 mcg. CO-OX down to 42%.  CVP 17-18 still though weight down.    Denies SOB. Appetite improving.    Objective:   Weight Range: 154 lb 8.7 oz (70.1 kg) Body mass index is 27.38 kg/m.   Vital Signs:   Temp:  [97.2 F (36.2 C)-99.5 F (37.5 C)] 99.1 F (37.3 C) (03/22 0600) Pulse Rate:  [99-123] 112 (03/22 0700) Resp:  [8-24] 17 (03/22 0700) SpO2:  [85 %-99 %] 95 % (03/22 0700) Arterial Line BP: (68-118)/(36-57) 89/45 (03/22 0700) Weight:  [154 lb 8.7 oz (70.1 kg)] 154 lb 8.7 oz (70.1 kg) (03/22 0700) Last BM Date: 12/30/16  Weight change: Filed Weights   01/01/17 0700 01/02/17 0600 01/03/17 0700  Weight: 163 lb 9.3 oz (74.2 kg) 158 lb 8.2 oz (71.9 kg) 154 lb 8.7 oz (70.1 kg)    Intake/Output:   Intake/Output Summary (Last 24 hours) at 01/03/17 0742 Last data filed at 01/03/17 0700  Gross per 24 hour  Intake          1791.43 ml  Output             2933 ml  Net         -1141.57 ml     Physical Exam: CVP 17-18 General:  Elderly. NAD. In bed on R side. CVVH ongoing.    Neck: Thick. JVP to jaw.   No thyromegaly or nodule noted.  Cor: PMI nondisplaced. Tachy irregular. Rate normal. No S3/S4. 1/6 SEM RUSB.  Lungs: Crackles     Abdomen: Obese, distended, edematous. No hepatosplenomegaly. No bruits or masses. +BS Extremities: no cyanosis, clubbing,  rash. R and LLE no  edema to knees bilaterally. L foot transmetatarsal amputation. R and LLE ace wraps. R and L Knee with foam dressing  Neuro: alert & orientedx3, cranial nerves grossly intact. moves all 4 extremities w/o difficulty. Normal affect.   Telemetry: Personally reviewed, atrial flutter 100s   Labs: CBC  Recent Labs  01/02/17 0330 01/03/17 0539  WBC 9.1 7.8  HGB 12.8 13.0  HCT 38.9 40.5  MCV 90.9 92.9  PLT 57* 77*   Basic Metabolic Panel  Recent Labs  01/02/17 0330 01/02/17 1641 01/03/17 0539  NA 132* 131* 132*  K 4.0 4.3 4.3  CL 99* 99* 97*  CO2 25 23 21*  GLUCOSE 192* 187* 197*  BUN 10 9 9   CREATININE 1.30* 1.27* 1.42*  CALCIUM 8.4* 8.5* 8.5*  MG 2.4  --  2.4  PHOS 2.2* 2.9 3.1   Liver Function Tests  Recent Labs  01/02/17 1641 01/03/17 0539  ALBUMIN 2.4* 2.3*   No results for input(s): LIPASE, AMYLASE in the last 72 hours. Cardiac Enzymes No results for input(s): CKTOTAL, CKMB, CKMBINDEX, TROPONINI in the last 72 hours.  BNP: BNP (last 3 results)  Recent Labs  10/12/16 1026 12/10/16 1431 12/30/2016 2016  BNP 1,200.9* 1,134.8* 706.0*    ProBNP (last 3 results)  Recent Labs  12/10/16 1431  PROBNP 21,500*     D-Dimer No results for input(s): DDIMER in the last 72 hours. Hemoglobin A1C No results for input(s): HGBA1C in the last 72 hours. Fasting Lipid Panel No results for input(s): CHOL, HDL, LDLCALC, TRIG, CHOLHDL, LDLDIRECT in the last 72 hours. Thyroid Function Tests No results for input(s): TSH, T4TOTAL, T3FREE, THYROIDAB in the last 72 hours.  Invalid input(s): FREET3  Other results:     Imaging/Studies:  No results found.    Medications:     Scheduled Medications: . atorvastatin  40 mg Oral QHS  . Chlorhexidine Gluconate Cloth  6 each Topical Daily  . Gerhardt's butt cream   Topical BID  . guaiFENesin  600 mg Oral BID  . insulin aspart  0-15 Units Subcutaneous TID WC  . levothyroxine  112.5 mcg Oral QAC  breakfast  . mouth rinse  15 mL Mouth Rinse QID  . senna-docusate  1 tablet Oral Daily  . sodium chloride flush  10-40 mL Intracatheter Q12H  . sodium chloride flush  3 mL Intravenous Q12H    Infusions: . amiodarone 30 mg/hr (01/02/17 2000)  . bivalirudin (ANGIOMAX) infusion 0.5 mg/mL (Non-ACS indications) 0.02 mg/kg/hr (01/02/17 0800)  . milrinone 0.375 mcg/kg/min (01/02/17 2000)  . norepinephrine (LEVOPHED) Adult infusion 38 mcg/min (01/03/17 0640)  . dialysis replacement fluid (prismasate) 400 mL/hr at 01/03/17 0141  . dialysis replacement fluid (prismasate) 200 mL/hr at 01/03/17 0141  . dialysate (PRISMASATE) 1,500 mL/hr at 01/03/17 0424    PRN Medications: Place/Maintain arterial line **AND** sodium chloride, acetaminophen (TYLENOL) oral liquid 160 mg/5 mL, ALPRAZolam, fentaNYL (SUBLIMAZE) injection, guaiFENesin-dextromethorphan, hydrOXYzine, levalbuterol, ondansetron **OR** ondansetron (ZOFRAN) IV, sodium chloride, sodium chloride flush   Assessment/Plan   [name] is a 71 y.o. female CAD s/p CABG, chronic combined CHF, moderate aortic stenosis, DM, HLD, PVD followed by Dr. Donnetta Hutching s/p L foot transmetatarsal amputation, hypothyroidism, syncope, CKD stage III-IV, and Iron/B12 deficiency.  Admitted for edema and poor urine output.  Found to be hypotensive and in cardiogenic shock. Also found to be in [place] Afib. ? Infection origin/early + UA, UCx with E. Coli and CXR concerning for PNA.  1. Acute on chronic biventricular CHF: Ischemic cardiomyopathy.  Echo with admission with EF 15%, severely decreased RV systolic function, mid to moderate aortic stenosis.  CVP 18 today.  Pt remains markedly volume overloaded on exam. CVVH started 3/17  Co-ox down to 42% despite norepi 38 mcg +  milrinone 0.375 mcg/kg/min.    UF now at 50 cc/hr.  CVP 17-18. - Continue CVVH per renal. We are not making any progress.   - She does not have ICD. Will eventually need consideration with long  standing low EF if she recovers from this hospitalization.  - Not a candidate for advanced therapies with RV failure, renal failure, and limited mobility. 2. Atrial fibrillation versus atypical flutter: She appears to be in atrial flutter this morning.   - Continue amio gtt for now.   - Plan eventual TEE/DCCV if she stabilizes.  - Will need anticoag with CHA2DS2/VASc of 6.  On Bivalirudin with low plts. Hit antibody sent and pending.  3. AKI on CKD stage 3: Baseline creatinine 1.4-1.6.   Suspect component of cardiorenal syndrome but also ATN with hypotensive episode initially.  She is now on CVVH per nephrology to see if volume unloading will help her renal function.  No urine output.  She is not a good long-term HD candidate.   4. CAD: No chest pain. Last cath in 12/12 with patent LIMA to LAD with occlusion of distal LAD; High grade stenosis of nondominant RCA; Signif disease of prox LCx; SVG to L PDA occluded, SVG to OM patent).  Attempted PTCA of proximal LCx but failed 10/2011. No good  revascularization options.   - Continue statin, stop ASA with low plts.   5. PAD: Extensive, especially in the left leg.  She is s/p left transmetatarsal amputation.  This has led to her limited mobility. Followed by [facility].  - No change to current plan.  6. UTI: E coli. Has completed aztreonam course.    7. Right pleural effusion:  Could consider thoracentesis eventually.   8. Aortic stenosis: Mild to possibly moderate on echo. No change to current plan.  9. Epistaxis: Resolved, now on bivalirudin gtt (low plts).  Keep O2 humidified.    10. Hyponatremia: Sodium 132.  11. Thrombocytopenia: Platelets trending up 57>77. Now on bivalirudin. HIT antibody pending. Hold aspirin.   We called today to have HIT result sent.     Palliative Care completed 3/20. Now limited code.   Length of Stay: [place], NP  01/03/2017, 7:42 AM  Advanced Heart Failure Team Pager [phone] (M-F; 7a - 4p)  Please contact [facility] for night-coverage after hours (4p -7a ) and weekends  Patient seen with NP, agree with the above note.  She is now requiring a higher dose of norepinephrine to keep BP up. Weight coming down, CVP remains about 17-18.  Making very little urine. Co-ox lower today at 42%.    At this point, we are not making much progress.  Would continue CVVH today and tomorrow (asks for more time to talk with family).  After that, would turn off CVVH and see how she does on her own.  She is not long-term HD candidate.  I am not optimistic about her chances for recovery. Would continue current support (milrinone 0.375 and norepinephrine).  Will re-send co-ox today, but do not think that increasing inotropes is going to help in the long run.   Discussed situation with patient and family.  They understand where we stand.  Palliative care is involved.   Loralie Champagne 01/03/2017 1:12 PM

## 2017-01-03 NOTE — Progress Notes (Addendum)
[facility] for Bivalirudin Indication: atrial fibrillation/Rule-out HIT/CRRT  Allergies  Allergen Reactions  . Cephalexin Swelling and Rash  . Ciprofloxacin Swelling and Rash  . Zyvox [Linezolid] Other (See Comments)    Thrombocytopenia developed to 50k after several weeks of therapy  . Adhesive [Tape] Other (See Comments)    "old [employer] adhesive tape; takes my skin off; can use paper tape"  . Bactrim [Sulfamethoxazole-Trimethoprim] Other (See Comments)    Hyperkalemia (elevated potassium) and Increased creatinine  . Daptomycin Other (See Comments)    Had elevated CPK on therapy, not clear that this was due to cubicin  . Vancomycin Other (See Comments)    ? If this played role in her Acute kidney injury    Patient Measurements: Height: 5\' 3"  (160 cm) Weight: 154 lb 8.7 oz (70.1 kg) IBW/kg (Calculated) : 52.4 Heparin Dosing Weight: ~70kg  Vital Signs: Temp: 97.6 F (36.4 C) (03/22 1116) Temp Source: Axillary (03/22 1116) BP: 110/73 (03/22 0925) Pulse Rate: 105 (03/22 1200)  Labs:  Recent Labs  01/01/17 0505  01/02/17 0330 01/02/17 1641 01/03/17 0539  HGB 13.3  --  12.8  --  13.0  HCT 40.4  --  38.9  --  40.5  PLT 58*  --  57*  --  77*  APTT 71*  --  62*  --  73*  CREATININE 1.47*  < > 1.30* 1.27* 1.42*  < > = values in this interval not displayed.  Estimated Creatinine Clearance: 34.1 mL/min (A) (by C-G formula based on SCr of 1.42 mg/dL (H)).   Assessment: 70yof previously on heparin for afib.  Heparin was on hold on 3/15 and 3/17 with recurrent epistaxis and hematuria. Resumed anticoagulation with bivalirudin on 3/18 due to concern of HIT (heparin antibody pending; Called [facility] today and should be getting results by fax later today). Patient is on CRRT and noted DNI.  -aPTT= 73 and slightly above goal  Goal of Therapy:  Goal aPTT: 50-70 per Dr. Aundra Dubin due to bleeding risk Monitor platelets by anticoagulation  protocol: Yes   Plan:  - Continue bivalirudin 0.02mg /kg/hr -Daily aPTT and CBC -Will follow heparin antibody results  Hildred Laser, Pharm D 01/03/2017 1:00 PM

## 2017-01-03 NOTE — Progress Notes (Signed)
Pharmacy note: HIT antibody  HIT antibody pending: I called [facility] and was faxed the result (the result was not released for unclear reasons).  The result was reported to Darrick Grinder NP.   -heparin antibody: negative (0.338)  Plan -No therapy changes at this time -Will place the fax in the patient's shadow chart  Hildred Laser, Pharm D 01/03/2017 3:24 PM

## 2017-01-03 NOTE — Progress Notes (Signed)
Patient ID: [name], female   DOB: 10-18-1945, 71 y.o.   MRN: [id number]  [facility] Progress Note   Assessment/ Plan:   1. Acute kidney injury on chronic kidney disease stage III: Suspect cardiorenal syndrome with superimposed ATN with relative hypotension/cardiogenic shock.  started on CRRT 3/17 after refractoriness to diuretics/inotropic support. Have achieved ultrafiltration  CVP lower - remains pressor dependent- is not a candidate for chronic HD given cardiomyopathy- pt and family aware.  Continue CRRT for another day but unfortunately not optimistic about kidney turn around.  Pt balking at decision to stop CRRT tomorrow- may need palliative care assist to establish end point 2. Acute exacerbation of congestive heart failure: History of ischemic cardiomyopathy with EF of 15%. Started on CRRT after failed response to diuretic/inotropes. Have been able to remove fluid but also pressors  3. Hyponatremia: Secondary to CHF exacerbation and acute on chronic renal insufficiency- somewhat better with ultrafiltration. 4. New onset atrial fibrillation/flutter: Remains rate controlled and currently on amiodarone drip. 5. Elytes- controlled- all 4 K - will give phos 6. Anemia- not an issue 7. Dispo- palliative care to get involved- I had told them that Friday (tomorrow) might be a good day to liberate from CRRT to see what happens 8. Platelets down- heparin stopped on bivalirudin- rebounded some   Subjective:   on CRRT- is running well- took  UF down yesterday ended up with 1.1 liters off. I have gone over the situation with the patient - that if kidneys do not turn around we dont feel candidate for IHD.  She seems to understand and is asking appropriate questions- wanting to get her affairs in order.  Really no UOP.  Now saying she may want at least one more day on CRRT ? "I feel the need to complete more things "   Objective:   BP 118/60   Pulse (!) 112   Temp 98.3 F  (36.8 C) (Axillary)   Resp 17   Ht 5\' 3"  (1.6 m)   Wt 70.1 kg (154 lb 8.7 oz)   SpO2 95%   BMI 27.38 kg/m   Intake/Output Summary (Last 24 hours) at 01/03/17 8185 Last data filed at 01/03/17 0700  Gross per 24 hour  Intake          1741.03 ml  Output             2723 ml  Net          -981.97 ml   Weight change: -1.8 kg (-3 lb 15.5 oz)  Physical Exam: Gen: Appears to be uncomfortable resting in bed with intermittent left leg pain- says feels decent today- left IJ vascath placed 3/17 CVS: Pulse irregularly irregular tachycardia, 3/6 holosystolic murmur, elevated JVD Resp: Coarse breath sounds anteriorly Abd: Soft, obese, nontender Ext: 2+ lower extremity edema with Ace wrap over her extremities - painful and amputations  Imaging: No results found.  Labs: BMET  Recent Labs Lab 12/31/16 0411 12/31/16 1834 01/01/17 0505 01/01/17 1618 01/02/17 0330 01/02/17 1641 01/03/17 0539  NA 131* 133* 133* 132* 132* 131* 132*  K 4.7 4.3 4.0 4.2 4.0 4.3 4.3  CL 99* 101 98* 101 99* 99* 97*  CO2 19* 22 24 21* 25 23 21*  GLUCOSE 176* 173* 161* 192* 192* 187* 197*  BUN 31* 22* 17 14 10 9 9   CREATININE 1.91* 1.69* 1.47* 1.44* 1.30* 1.27* 1.42*  CALCIUM 8.6* 8.7* 8.7* 8.6* 8.4* 8.5* 8.5*  PHOS 3.1 2.4* 2.1* 1.7*  2.2* 2.9 3.1   CBC  Recent Labs Lab 12/31/16 0411 01/01/17 0505 01/02/17 0330 01/03/17 0539  WBC 9.8 9.7 9.1 7.8  HGB 13.4 13.3 12.8 13.0  HCT 40.4 40.4 38.9 40.5  MCV 90.2 90.2 90.9 92.9  PLT 85* 58* 57* 77*   Medications:    . atorvastatin  40 mg Oral QHS  . Chlorhexidine Gluconate Cloth  6 each Topical Daily  . Gerhardt's butt cream   Topical BID  . guaiFENesin  600 mg Oral BID  . insulin aspart  0-15 Units Subcutaneous TID WC  . levothyroxine  112.5 mcg Oral QAC breakfast  . mouth rinse  15 mL Mouth Rinse QID  . senna-docusate  1 tablet Oral Daily  . sodium chloride flush  10-40 mL Intracatheter Q12H  . sodium chloride flush  3 mL Intravenous Q12H    Yadriel Kerrigan A  01/03/2017, 8:19 AM

## 2017-01-04 DIAGNOSIS — N170 Acute kidney failure with tubular necrosis: Secondary | ICD-10-CM

## 2017-01-04 LAB — CBC
HCT: 39.7 % (ref 36.0–46.0)
HEMOGLOBIN: 13 g/dL (ref 12.0–15.0)
MCH: 30 pg (ref 26.0–34.0)
MCHC: 32.7 g/dL (ref 30.0–36.0)
MCV: 91.7 fL (ref 78.0–100.0)
Platelets: 96 10*3/uL — ABNORMAL LOW (ref 150–400)
RBC: 4.33 MIL/uL (ref 3.87–5.11)
RDW: 17 % — AB (ref 11.5–15.5)
WBC: 7.7 10*3/uL (ref 4.0–10.5)

## 2017-01-04 LAB — RENAL FUNCTION PANEL
Albumin: 2.3 g/dL — ABNORMAL LOW (ref 3.5–5.0)
Albumin: 2.4 g/dL — ABNORMAL LOW (ref 3.5–5.0)
Anion gap: 10 (ref 5–15)
Anion gap: 11 (ref 5–15)
BUN: 10 mg/dL (ref 6–20)
BUN: 11 mg/dL (ref 6–20)
CHLORIDE: 100 mmol/L — AB (ref 101–111)
CO2: 22 mmol/L (ref 22–32)
CO2: 21 mmol/L — AB (ref 22–32)
CREATININE: 1.25 mg/dL — AB (ref 0.44–1.00)
Calcium: 8.5 mg/dL — ABNORMAL LOW (ref 8.9–10.3)
Calcium: 8.4 mg/dL — ABNORMAL LOW (ref 8.9–10.3)
Chloride: 98 mmol/L — ABNORMAL LOW (ref 101–111)
Creatinine, Ser: 1.37 mg/dL — ABNORMAL HIGH (ref 0.44–1.00)
GFR calc Af Amer: 44 mL/min — ABNORMAL LOW (ref >60)
GFR calc Af Amer: 49 mL/min — ABNORMAL LOW (ref >60)
GFR calc non Af Amer: 38 mL/min — ABNORMAL LOW (ref >60)
GFR calc non Af Amer: 42 mL/min — ABNORMAL LOW (ref >60)
GLUCOSE: 185 mg/dL — AB (ref 65–99)
Glucose, Bld: 244 mg/dL — ABNORMAL HIGH (ref 65–99)
POTASSIUM: 4.3 mmol/L (ref 3.5–5.1)
POTASSIUM: 4.5 mmol/L (ref 3.5–5.1)
Phosphorus: 2.2 mg/dL — ABNORMAL LOW (ref 2.5–4.6)
Phosphorus: 2.9 mg/dL (ref 2.5–4.6)
Sodium: 130 mmol/L — ABNORMAL LOW (ref 135–145)
Sodium: 132 mmol/L — ABNORMAL LOW (ref 135–145)

## 2017-01-04 LAB — GLUCOSE, CAPILLARY
GLUCOSE-CAPILLARY: 207 mg/dL — AB (ref 65–99)
GLUCOSE-CAPILLARY: 289 mg/dL — AB (ref 65–99)
Glucose-Capillary: 149 mg/dL — ABNORMAL HIGH (ref 65–99)
Glucose-Capillary: 228 mg/dL — ABNORMAL HIGH (ref 65–99)

## 2017-01-04 LAB — COOXEMETRY PANEL
Carboxyhemoglobin: 1.4 % (ref 0.5–1.5)
Methemoglobin: 1 % (ref 0.0–1.5)
O2 SAT: 63.7 %
TOTAL HEMOGLOBIN: 13.5 g/dL (ref 12.0–16.0)

## 2017-01-04 LAB — MAGNESIUM: MAGNESIUM: 2.4 mg/dL (ref 1.7–2.4)

## 2017-01-04 LAB — APTT: APTT: 66 s — AB (ref 24–36)

## 2017-01-04 MED ORDER — SODIUM PHOSPHATES 45 MMOLE/15ML IV SOLN
20.0000 mmol | Freq: Once | INTRAVENOUS | Status: AC
  Administered 2017-01-04: 20 mmol via INTRAVENOUS
  Filled 2017-01-04: qty 6.67

## 2017-01-04 NOTE — Progress Notes (Signed)
Spoke briefly with patient regarding insulin.  She states that she refused Levemir last PM due to blood sugars being in the 120's.  Support visit made.  Thanks, Adah Perl, RN, BC-ADM Inpatient Diabetes Coordinator Pager [phone] (8a-5p)

## 2017-01-04 NOTE — Progress Notes (Signed)
[place] for Bivalirudin Indication: atrial fibrillation/Rule-out HIT/CRRT  Allergies  Allergen Reactions  . Cephalexin Swelling and Rash  . Ciprofloxacin Swelling and Rash  . Zyvox [Linezolid] Other (See Comments)    Thrombocytopenia developed to 50k after several weeks of therapy  . Adhesive [Tape] Other (See Comments)    "old [employer] adhesive tape; takes my skin off; can use paper tape"  . Bactrim [Sulfamethoxazole-Trimethoprim] Other (See Comments)    Hyperkalemia (elevated potassium) and Increased creatinine  . Daptomycin Other (See Comments)    Had elevated CPK on therapy, not clear that this was due to cubicin  . Vancomycin Other (See Comments)    ? If this played role in her Acute kidney injury    Patient Measurements: Height: 5\' 3"  (160 cm) Weight: 156 lb 8.4 oz (71 kg) IBW/kg (Calculated) : 52.4 Heparin Dosing Weight: ~70kg  Vital Signs: Temp: 96 F (35.6 C) (03/23 1113) Temp Source: Axillary (03/23 1113) BP: 85/47 (03/23 1300) Pulse Rate: 113 (03/23 1400)  Labs:  Recent Labs  01/02/17 0330  01/03/17 0539 01/03/17 1600 01/04/17 0430  HGB 12.8  --  13.0  --  13.0  HCT 38.9  --  40.5  --  39.7  PLT 57*  --  77*  --  96*  APTT 62*  --  73*  --  66*  CREATININE 1.30*  < > 1.42* 1.39* 1.37*  < > = values in this interval not displayed.  Estimated Creatinine Clearance: 35.6 mL/min (A) (by C-G formula based on SCr of 1.37 mg/dL (H)).   Assessment: 70yof previously on heparin for afib.  Heparin was on hold on 3/15 and 3/17 with recurrent epistaxis and hematuria. Resumed anticoagulation with bivalirudin on 3/18 due to concern of HIT (heparin antibody pending; Called [facility] today and should be getting results by fax later today). Patient is on CRRT and noted DNI. Likely comfort care soon.  -aPTT= 66, no bleeding issues noted.   Goal of Therapy:  Goal aPTT: 50-70 per Dr. Aundra Dubin due to bleeding risk Monitor  platelets by anticoagulation protocol: Yes   Plan:  -Continue bivalirudin 0.02mg /kg/hr -Daily aPTT and CBC -Will follow heparin antibody results  Erin Hearing PharmD., BCPS Clinical Pharmacist Pager [phone] 01/04/2017 2:14 PM

## 2017-01-04 NOTE — Progress Notes (Signed)
Patient ID: [name], female   DOB: August 19, 1946, 71 y.o.   MRN: [id number]     Advanced Heart Failure Rounding Note  Referring Physician: Dr Elsworth Soho Primary Physician:Dr. Carlota Raspberry Primary Cardiologist:  Dr. Harrington Challenger  Reason for Consultation: Cardiogenic shock   Subjective:    Developed shock overnight 12/26/16. Central line and Art line placed. Started on dobutamine. AHF team consulted. Echo significant for EF 30% down to 15%.  Off heparin due to thrombocytopenia, HIT antibody negative. On Bival.  Platelets trending up to 77.   UF at 50  cc/hr. Over night norepi was cut back to 28 mcg. Continued on milrinone 0.375 mcg. CO-OX up 64%.  CVP 17.   Slept well. Asking for Dr Harrington Challenger to come by.     Objective:   Weight Range: 156 lb 8.4 oz (71 kg) Body mass index is 27.73 kg/m.   Vital Signs:   Temp:  [97.6 F (36.4 C)-98.4 F (36.9 C)] 97.7 F (36.5 C) (03/23 0400) Pulse Rate:  [102-119] 118 (03/23 0700) Resp:  [13-29] 15 (03/23 0700) BP: (110)/(73) 110/73 (03/22 0925) SpO2:  [90 %-97 %] 97 % (03/23 0700) Arterial Line BP: (92-140)/(43-71) 95/43 (03/23 0700) Weight:  [156 lb 8.4 oz (71 kg)] 156 lb 8.4 oz (71 kg) (03/23 0500) Last BM Date: 12/30/16  Weight change: Filed Weights   01/02/17 0600 01/03/17 0700 01/04/17 0500  Weight: 158 lb 8.2 oz (71.9 kg) 154 lb 8.7 oz (70.1 kg) 156 lb 8.4 oz (71 kg)    Intake/Output:   Intake/Output Summary (Last 24 hours) at 01/04/17 0725 Last data filed at 01/04/17 0700  Gross per 24 hour  Intake          1763.85 ml  Output             2500 ml  Net          -736.15 ml     Physical Exam: CVP 17 General:  Elderly. NAD. CVVH ongoing.    Neck: Thick. JVP to jaw.   No thyromegaly or nodule noted.  Cor: PMI nondisplaced. Tachy irregular. Rate normal. No S3/S4. 1/6 SEM RUSB.  Lungs: Decreased in the bases.    Abdomen: Obese, distended, edematous. No hepatosplenomegaly. No bruits or masses. +BS Extremities: no cyanosis, clubbing, rash. R  and LLE no  edema to knees bilaterally. L foot transmetatarsal amputation. R and LLE ace wraps. R Knee foam dressing . L Knee eschar.  Neuro: alert & orientedx3, cranial nerves grossly intact. moves all 4 extremities w/o difficulty. Normal affect.   Telemetry: Personally reviewed, atrial flutter 100-110s   Labs: CBC  Recent Labs  01/03/17 0539 01/04/17 0430  WBC 7.8 7.7  HGB 13.0 13.0  HCT 40.5 39.7  MCV 92.9 91.7  PLT 77* 96*   Basic Metabolic Panel  Recent Labs  01/03/17 0539 01/03/17 1600 01/04/17 0430  NA 132* 130* 130*  K 4.3 4.2 4.3  CL 97* 100* 98*  CO2 21* 22 22  GLUCOSE 197* 207* 185*  BUN 9 10 10   CREATININE 1.42* 1.39* 1.37*  CALCIUM 8.5* 8.4* 8.5*  MG 2.4  --  2.4  PHOS 3.1 2.2* 2.2*   Liver Function Tests  Recent Labs  01/03/17 1600 01/04/17 0430  ALBUMIN 2.4* 2.3*   No results for input(s): LIPASE, AMYLASE in the last 72 hours. Cardiac Enzymes No results for input(s): CKTOTAL, CKMB, CKMBINDEX, TROPONINI in the last 72 hours.  BNP: BNP (last 3 results)  Recent Labs  10/12/16 1026  12/10/16 1431 12/27/2016 2016  BNP 1,200.9* 1,134.8* 706.0*    ProBNP (last 3 results)  Recent Labs  12/10/16 1431  PROBNP 21,500*     D-Dimer No results for input(s): DDIMER in the last 72 hours. Hemoglobin A1C No results for input(s): HGBA1C in the last 72 hours. Fasting Lipid Panel No results for input(s): CHOL, HDL, LDLCALC, TRIG, CHOLHDL, LDLDIRECT in the last 72 hours. Thyroid Function Tests No results for input(s): TSH, T4TOTAL, T3FREE, THYROIDAB in the last 72 hours.  Invalid input(s): FREET3  Other results:     Imaging/Studies:  No results found.    Medications:     Scheduled Medications: . atorvastatin  40 mg Oral QHS  . Chlorhexidine Gluconate Cloth  6 each Topical Daily  . Gerhardt's butt cream   Topical BID  . guaiFENesin  600 mg Oral BID  . insulin aspart  0-15 Units Subcutaneous TID WC  . insulin detemir  4 Units  Subcutaneous QHS  . levothyroxine  112.5 mcg Oral QAC breakfast  . mouth rinse  15 mL Mouth Rinse QID  . senna-docusate  1 tablet Oral Daily  . sodium chloride flush  10-40 mL Intracatheter Q12H  . sodium chloride flush  3 mL Intravenous Q12H    Infusions: . amiodarone 30 mg/hr (01/04/17 0700)  . bivalirudin (ANGIOMAX) infusion 0.5 mg/mL (Non-ACS indications) 0.02 mg/kg/hr (01/04/17 0700)  . milrinone 0.375 mcg/kg/min (01/04/17 0700)  . norepinephrine (LEVOPHED) Adult infusion 28 mcg/min (01/04/17 0705)  . dialysis replacement fluid (prismasate) 400 mL/hr at 01/04/17 0408  . dialysis replacement fluid (prismasate) 200 mL/hr at 01/04/17 0408  . dialysate (PRISMASATE) 1,500 mL/hr at 01/04/17 0434    PRN Medications: Place/Maintain arterial line **AND** sodium chloride, acetaminophen (TYLENOL) oral liquid 160 mg/5 mL, ALPRAZolam, fentaNYL (SUBLIMAZE) injection, guaiFENesin-dextromethorphan, hydrOXYzine, levalbuterol, ondansetron **OR** ondansetron (ZOFRAN) IV, sodium chloride, sodium chloride flush   Assessment/Plan   [name] is a 71 y.o. female CAD s/p CABG, chronic combined CHF, moderate aortic stenosis, DM, HLD, PVD followed by Dr. Donnetta Hutching s/p L foot transmetatarsal amputation, hypothyroidism, syncope, CKD stage III-IV, and Iron/B12 deficiency.  Admitted for edema and poor urine output.  Found to be hypotensive and in cardiogenic shock. Also found to be in [place] Afib. ? Infection origin/early + UA, UCx with E. Coli and CXR concerning for PNA.  1. Acute on chronic biventricular CHF: Ischemic cardiomyopathy.  Echo with admission with EF 15%, severely decreased RV systolic function, mid to moderate aortic stenosis.  CVP 18 today.  Pt remains markedly volume overloaded on exam. CVVH started 3/17  Co-ox 64% today. Continue norepi 28 mcg +  milrinone 0.375 mcg/kg/min.    UF now at 50 cc/hr.  CVP 17 - Continue CVVH per renal. We are not making any progress. Possibly stopping Sat.   -  Not a candidate for advanced therapies with RV failure, renal failure, and limited mobility. 2. Atrial fibrillation versus atypical flutter: She appears to be in atrial flutter this morning.   - Continue amio gtt for now.   - Plan eventual TEE/DCCV if she stabilizes.  - Will need anticoag with CHA2DS2/VASc of 6.  On Bivalirudin with low plts. Hit antibody sent and pending.  3. AKI on CKD stage 3: Baseline creatinine 1.4-1.6.   Suspect component of cardiorenal syndrome but also ATN with hypotensive episode initially.  She is now on CVVH per nephrology to see if volume unloading will help her renal function.  No urine output. She is not a good long-term  HD candidate.   4. CAD: No chest pain. Last cath in 12/12 with patent LIMA to LAD with occlusion of distal LAD; High grade stenosis of nondominant RCA; Signif disease of prox LCx; SVG to L PDA occluded, SVG to OM patent).  Attempted PTCA of proximal LCx but failed 10/2011. No good  revascularization options.   - Continue statin, stop ASA with low plts.   5. PAD: Extensive, especially in the left leg.  She is s/p left transmetatarsal amputation.  This has led to her limited mobility. Followed by [facility].  - No change to current plan.  6. UTI: E coli. Has completed aztreonam course.    7. Right pleural effusion:  Could consider thoracentesis eventually.   8. Aortic stenosis: Mild to possibly moderate on echo. No change to current plan.  9. Epistaxis: Resolved, now on bivalirudin gtt (low plts).  Keep O2 humidified.    10. Hyponatremia: Sodium 130.  11. Thrombocytopenia: Platelets trending up 57>77>99. Now on bivalirudin. HIT antibody negative. [facility] was contacted. Hold aspirin.    12. DNIR   Palliative Care input appreciated. Possibly stopping CVVHD Sat?   I called Dr Harrington Challenger at her request. Dr Harrington Challenger plans to come by today.   Length of Stay: [place], NP  01/04/2017, 7:25 AM  Advanced Heart Failure Team Pager [phone] (M-F; 7a - 4p)  Please  contact [facility] for night-coverage after hours (4p -7a ) and weekends  Patient seen with NP, agree with the above note.  Co-ox better today at 64%, CVP still 17.  Requiring somewhat less norepinephrine at 28.  CVVH to continue today, plan at this point will be to stop tomorrow and place on high dose diuretics. If she does not respond to diuretics, will need to proceed down comfort care route.  Patient and family understand the situation fully.  Palliative care is involved.   Loralie Champagne 01/04/2017 10:34 AM

## 2017-01-04 NOTE — Progress Notes (Signed)
Patient ID: [name], female   DOB: [birth year], 71 y.o.   MRN: [id number]  [facility] Progress Note   Assessment/ Plan:   1. Acute kidney injury on chronic kidney disease stage III: cardiorenal syndrome with superimposed ATN with relative hypotension/cardiogenic shock.  started on CRRT 3/17 after refractoriness to diuretics/inotropic support. Have achieved ultrafiltration  - is not a candidate for chronic HD given cardiomyopathy- pt and family aware.  Continue CRRT for another one day but unfortunately not optimistic about kidney turn around.   2. Acute exacerbation of congestive heart failure: History of ischemic cardiomyopathy with EF of 15%. Started on CRRT after failed response to diuretic/inotropes. Have been able to remove fluid but also pressors - will increase fluid removal since CVP seems to be trending up again  3. Hyponatremia: Secondary to CHF exacerbation and acute on chronic renal insufficiency- somewhat better with ultrafiltration. 4. New onset atrial fibrillation/flutter: Remains rate controlled and currently on amiodarone drip. 5. [place]tes- controlled- all 4 K - will give phos 6. Anemia- not an issue 7. Dispo- palliative care to get involved- I had told them that today might be a good day to liberate from CRRT - pt has now pushed to tomorrow.  Is OK as long as this is not a daily thing 8. Platelets down- heparin stopped on bivalirudin- rebounded some   Subjective:   on CRRT- is running well- took  UF down - now with CVP up- pt looks much brighter today than yesterday- slept well    Objective:   BP 110/73 (BP Location: Right Arm)   Pulse (!) 119   Temp 98 F (36.7 C) (Axillary)   Resp 11   Ht 5\' 3"  (1.6 m)   Wt 71 kg (156 lb 8.4 oz)   SpO2 98%   BMI 27.73 kg/m   Intake/Output Summary (Last 24 hours) at 01/04/17 1036 Last data filed at 01/04/17 1000  Gross per 24 hour  Intake          1657.17 ml  Output             2469 ml  Net           -811.83 ml   Weight change: 0.9 kg (1 lb 15.8 oz)  Physical Exam: Gen: Appears to be uncomfortable resting in bed with intermittent left leg pain- says feels decent today- left IJ vascath placed 3/17 CVS: Pulse irregularly irregular tachycardia, 3/6 holosystolic murmur, elevated JVD Resp: Coarse breath sounds anteriorly Abd: Soft, obese, nontender Ext: 2+ lower extremity edema with Ace wrap over her extremities - painful and amputations  Imaging: No results found.  Labs: BMET  Recent Labs Lab 01/01/17 0505 01/01/17 1618 01/02/17 0330 01/02/17 1641 01/03/17 0539 01/03/17 1600 01/04/17 0430  NA 133* 132* 132* 131* 132* 130* 130*  K 4.0 4.2 4.0 4.3 4.3 4.2 4.3  CL 98* 101 99* 99* 97* 100* 98*  CO2 24 21* 25 23 21* 22 22  GLUCOSE 161* 192* 192* 187* 197* 207* 185*  BUN 17 14 10 9 9 10 10   CREATININE 1.47* 1.44* 1.30* 1.27* 1.42* 1.39* 1.37*  CALCIUM 8.7* 8.6* 8.4* 8.5* 8.5* 8.4* 8.5*  PHOS 2.1* 1.7* 2.2* 2.9 3.1 2.2* 2.2*   CBC  Recent Labs Lab 01/01/17 0505 01/02/17 0330 01/03/17 0539 01/04/17 0430  WBC 9.7 9.1 7.8 7.7  HGB 13.3 12.8 13.0 13.0  HCT 40.4 38.9 40.5 39.7  MCV 90.2 90.9 92.9 91.7  PLT 58* 57* 77* 96*  Medications:    . atorvastatin  40 mg Oral QHS  . Chlorhexidine Gluconate Cloth  6 each Topical Daily  . Gerhardt's butt cream   Topical BID  . guaiFENesin  600 mg Oral BID  . insulin aspart  0-15 Units Subcutaneous TID WC  . insulin detemir  4 Units Subcutaneous QHS  . levothyroxine  112.5 mcg Oral QAC breakfast  . mouth rinse  15 mL Mouth Rinse QID  . senna-docusate  1 tablet Oral Daily  . sodium chloride flush  10-40 mL Intracatheter Q12H  . sodium chloride flush  3 mL Intravenous Q12H   Mckinnley Cottier A  01/04/2017, 10:36 AM

## 2017-01-04 NOTE — Progress Notes (Addendum)
Daily Progress Note   Patient Name: [name]       Date: 01/04/2017 DOB: [birth year]  Age: 70 y.o. MRN#: [id number] Attending Physician: Larey Dresser, MD Primary Care Physician: Wendie Agreste, MD Admit Date: 12/27/2016  Reason for Consultation/Follow-up: Establishing goals of care and Psychosocial/spiritual support r/t end stage heart failure and now with renal failure.   Subjective: [name] continues in good spirits. Slept well last night so she feels better.   Length of Stay: 11  Current Medications: Scheduled Meds:  . atorvastatin  40 mg Oral QHS  . Chlorhexidine Gluconate Cloth  6 each Topical Daily  . Gerhardt's butt cream   Topical BID  . guaiFENesin  600 mg Oral BID  . insulin aspart  0-15 Units Subcutaneous TID WC  . insulin detemir  4 Units Subcutaneous QHS  . levothyroxine  112.5 mcg Oral QAC breakfast  . mouth rinse  15 mL Mouth Rinse QID  . senna-docusate  1 tablet Oral Daily  . sodium chloride flush  10-40 mL Intracatheter Q12H  . sodium chloride flush  3 mL Intravenous Q12H  . sodium phosphate  Dextrose 5% IVPB  20 mmol Intravenous Once    Continuous Infusions: . amiodarone 30 mg/hr (01/04/17 0800)  . bivalirudin (ANGIOMAX) infusion 0.5 mg/mL (Non-ACS indications) 0.02 mg/kg/hr (01/04/17 0800)  . milrinone 0.375 mcg/kg/min (01/04/17 0924)  . norepinephrine (LEVOPHED) Adult infusion 28 mcg/min (01/04/17 0924)  . dialysis replacement fluid (prismasate) 400 mL/hr at 01/04/17 0408  . dialysis replacement fluid (prismasate) 200 mL/hr at 01/04/17 0408  . dialysate (PRISMASATE) 1,500 mL/hr at 01/04/17 0434    PRN Meds: Place/Maintain arterial line **AND** sodium chloride, acetaminophen (TYLENOL) oral liquid 160 mg/5 mL, ALPRAZolam, fentaNYL  (SUBLIMAZE) injection, guaiFENesin-dextromethorphan, hydrOXYzine, levalbuterol, ondansetron **OR** ondansetron (ZOFRAN) IV, sodium chloride, sodium chloride flush  Physical Exam  Constitutional: She is oriented to person, place, and time. She appears well-developed.  HENT:  Head: Normocephalic and atraumatic.  Cardiovascular: Tachycardia present.   Pulmonary/Chest: Effort normal. No accessory muscle usage. No tachypnea. No respiratory distress.  Abdominal: Normal appearance.  Neurological: She is alert and oriented to person, place, and time.  Nursing note and vitals reviewed.           Vital Signs: BP 110/73 (BP Location: Right Arm)   Pulse (!) 119  Temp 98 F (36.7 C) (Axillary)   Resp 11   Ht 5\' 3"  (1.6 m)   Wt 71 kg (156 lb 8.4 oz)   SpO2 98%   BMI 27.73 kg/m  SpO2: SpO2: 98 % O2 Device: O2 Device: Nasal Cannula O2 Flow Rate: O2 Flow Rate (L/min): 2 L/min  Intake/output summary:   Intake/Output Summary (Last 24 hours) at 01/04/17 1045 Last data filed at 01/04/17 1000  Gross per 24 hour  Intake          1657.17 ml  Output             2469 ml  Net          -811.83 ml   LBM: Last BM Date: 12/30/16 Baseline Weight: Weight: 72.1 kg (158 lb 15.2 oz) Most recent weight: Weight: 71 kg (156 lb 8.4 oz)       Palliative Assessment/Data:    Flowsheet Rows     Most Recent Value  Intake Tab  Referral Department  Cardiology  Unit at Time of Referral  ICU  Palliative Care Primary Diagnosis  Cardiac  Date Notified  12/31/16  Palliative Care Type  New Palliative care  Reason for referral  Clarify Goals of Care  Date of Admission  12/31/2016  # of days IP prior to Palliative referral  7  Clinical Assessment  Psychosocial & Spiritual Assessment  Palliative Care Outcomes      Patient Active Problem List   Diagnosis Date Noted  . Acute on chronic respiratory failure with hypoxemia ([place])   . Acute renal failure superimposed on stage 2 chronic kidney disease ([place])   .  Palliative care encounter   . Acute respiratory failure with hypoxia ([place]) 12/29/2016  . Shock ([place])   . Acute on chronic systolic congestive heart failure ([place])   . Atypical atrial flutter ([place])   . Hyponatremia 01/12/2017  . Acute renal failure (ARF) ([place]) 12/15/2016  . CAP (community acquired pneumonia) 12/16/2016  . 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 Cellulitis 01/11/2017  . Microcytosis 07/05/2016  . PAD (peripheral artery disease) ([place]) 03/23/2016  . Status post transmetatarsal amputation of left foot ([place]) 03/21/2016  . Pain in limb 03/02/2014  . Skin tear 11/17/2013  . PVD (peripheral vascular disease) ([place]) 09/22/2013  . Goals of care, counseling/discussion 06/09/2013  . Atherosclerosis of native arteries of the extremities with ulceration(440.23) 06/09/2013  . 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 Abnormality of gait 01/21/2013  . CKD (chronic kidney disease), stage III 11/29/2012  . AKI (acute kidney injury) ([place]) 09/04/2012  . Chronic combined systolic and diastolic heart failure ([place]) 04/24/2012  . Health care maintenance 04/24/2012  . Iron deficiency anemia 04/15/2012  . Vitamin B12 deficiency (dietary) anemia 04/15/2012  . Aortic stenosis 08/22/2011  . ACQUIRED HEMOLYTIC ANEMIA UNSPECIFIED 06/06/2009  . Coronary atherosclerosis 02/03/2009  . CAROTID ARTERY STENOSIS, WITHOUT INFARCTION 02/03/2009  . Peripheral artery disease 02/03/2009  . Unspecified hypothyroidism 02/01/2009  . Dyslipidemia 02/01/2009  . Essential hypertension 02/01/2009  . DM (diabetes mellitus) type I uncontrolled with renal manifestation ([place]) 10/15/1949    Palliative Care Assessment & Plan   HPI: 71 y.o. female  with past medical history of systolic and diastolic combined heart failure (EF 25-30% 07/2015), CAD s/p CABG, diabetes, HLD, PVD s/p left foot transmetatarsal amputation, CKD stage III-IV  iron/B12 deficiency admitted on 01/10/2017 with decreased urine output r/t worsening CHF EF  15% and likely now cardiorenal syndrome. Likely approaching EOL so palliative care consulted for [place] and support.   Assessment: [name] continues to be coping well with her declining health. She feels like she is coming to the end of having her affairs in order and feels at peace with accomplishing this.   She shared with me her spiritual history and how much she has enjoyed varies churches she has attended. She feels strong in her faith. However she does have concerns/fears about suffering and what the process of dying will be like. Palliative to follow to ensure comfort and support during this time.   - D/C CVVH Saturday with family at bedside - D/C infusions when unable to have meaningful interactions with family or with suffering (brother Chauncey Reading is worried about having to make this decisions - I reassure him that the medical team will guide him when this time comes) - May transfer to [facility] for comfort when infusions stopped - I would anticipate a likely hospital death  Questions answered and support provided to family as well.   Recommendations/Plan:  Leg spasms: Fentanyl 50 mcg every 4 hours prn - she says this relieves her spasms well.    Code Status:  DNR  Prognosis:   Likely days to week when no more dialysis, continued renal failure, and comfort care.   Discharge Planning:  To Be Determined   Thank you for allowing the Palliative Medicine Team to assist in the care of this patient.   Total Time 74min Prolonged Time Billed  no       Greater than 50%  of this time was spent counseling and coordinating care related to the above assessment and plan.  Vinie Sill, NP Palliative Medicine Team Pager # [phone] (M-F 8a-5p) Team Phone # [phone] (Nights/Weekends)

## 2017-01-05 LAB — COOXEMETRY PANEL
Carboxyhemoglobin: 1.7 % — ABNORMAL HIGH (ref 0.5–1.5)
METHEMOGLOBIN: 0.7 % (ref 0.0–1.5)
O2 Saturation: 53 %
Total hemoglobin: 12.5 g/dL (ref 12.0–16.0)

## 2017-01-05 LAB — RENAL FUNCTION PANEL
Albumin: 2 g/dL — ABNORMAL LOW (ref 3.5–5.0)
Anion gap: 9 (ref 5–15)
BUN: 9 mg/dL (ref 6–20)
CHLORIDE: 99 mmol/L — AB (ref 101–111)
CO2: 25 mmol/L (ref 22–32)
CREATININE: 1.22 mg/dL — AB (ref 0.44–1.00)
Calcium: 8.3 mg/dL — ABNORMAL LOW (ref 8.9–10.3)
GFR calc non Af Amer: 43 mL/min — ABNORMAL LOW (ref >60)
GFR, EST AFRICAN AMERICAN: 50 mL/min — AB (ref >60)
Glucose, Bld: 253 mg/dL — ABNORMAL HIGH (ref 65–99)
Phosphorus: 2.2 mg/dL — ABNORMAL LOW (ref 2.5–4.6)
Potassium: 4.1 mmol/L (ref 3.5–5.1)
Sodium: 133 mmol/L — ABNORMAL LOW (ref 135–145)

## 2017-01-05 LAB — MAGNESIUM: MAGNESIUM: 2.5 mg/dL — AB (ref 1.7–2.4)

## 2017-01-05 LAB — APTT: aPTT: 62 seconds — ABNORMAL HIGH (ref 24–36)

## 2017-01-05 NOTE — Progress Notes (Signed)
Patient ID: [name], female   DOB: June 01, 1946, 71 y.o.   MRN: [id number]     Advanced Heart Failure Rounding Note  Referring Physician: Dr Elsworth Soho Primary Physician:Dr. Carlota Raspberry Primary Cardiologist:  Dr. Harrington Challenger  Reason for Consultation: Cardiogenic shock   Subjective:    Remains on CVVHD. Plan to stop soon but patient has been reluctant to turn off as she feels she has a few more things to organize.  Remains on NE at 26 an milrinone.[name] CVVHD is keeping even.   Feels sore. Back aches. Denies dyspnea. No uremic symptoms.  No CP.   Objective:   Weight Range: 71.2 kg (156 lb 15.5 oz) Body mass index is 27.81 kg/m.   Vital Signs:   Temp:  [97.2 F (36.2 C)-98 F (36.7 C)] 97.6 F (36.4 C) (03/24 0400) Pulse Rate:  [106-121] 120 (03/24 1200) Resp:  [0-25] 21 (03/24 1200) SpO2:  [92 %-100 %] 98 % (03/24 1200) Arterial Line BP: (84-120)/(43-60) 116/48 (03/24 1200) Weight:  [71.2 kg (156 lb 15.5 oz)] 71.2 kg (156 lb 15.5 oz) (03/24 0500) Last BM Date: 12/30/16  Weight change: Filed Weights   01/03/17 0700 01/04/17 0500 01/05/17 0500  Weight: 70.1 kg (154 lb 8.7 oz) 71 kg (156 lb 8.4 oz) 71.2 kg (156 lb 15.5 oz)    Intake/Output:   Intake/Output Summary (Last 24 hours) at 01/05/17 1307 Last data filed at 01/05/17 1100  Gross per 24 hour  Intake           1568.8 ml  Output             3410 ml  Net          -1841.2 ml     Physical Exam: General:  Elderly. NAD. CVVH ongoing.    Neck: Thick. JVP to jaw on right  No thyromegaly or nodule noted. Left trialysis cath Cor: PMI laterally displaced. Tachy irregular. Rate normal. No S3/S4. 1/6 SEM RUSB.  Lungs: clear anteriorls   Abdomen: Obese, + distended, nontender. No bruits or masses. +BS Extremities: no cyanosis, clubbing, rash. 2+ edema L foot transmetatarsal amputation. R and LLE ace wraps. R Knee foam dressing . L Knee eschar. No erythema Neuro: alert & orientedx3, cranial nerves grossly intact. moves all 4  extremities w/o difficulty. Normal affect.   Telemetry: Atrial flutter 100-115 Personally reviewed    Labs: CBC  Recent Labs  01/03/17 0539 01/04/17 0430  WBC 7.8 7.7  HGB 13.0 13.0  HCT 40.5 39.7  MCV 92.9 91.7  PLT 77* 96*   Basic Metabolic Panel  Recent Labs  01/04/17 0430 01/04/17 1600 01/05/17 0500  NA 130* 132* 133*  K 4.3 4.5 4.1  CL 98* 100* 99*  CO2 22 21* 25  GLUCOSE 185* 244* 253*  BUN 10 11 9   CREATININE 1.37* 1.25* 1.22*  CALCIUM 8.5* 8.4* 8.3*  MG 2.4  --  2.5*  PHOS 2.2* 2.9 2.2*   Liver Function Tests  Recent Labs  01/04/17 1600 01/05/17 0500  ALBUMIN 2.4* 2.0*   No results for input(s): LIPASE, AMYLASE in the last 72 hours. Cardiac Enzymes No results for input(s): CKTOTAL, CKMB, CKMBINDEX, TROPONINI in the last 72 hours.  BNP: BNP (last 3 results)  Recent Labs  10/12/16 1026 12/10/16 1431 01/04/2017 2016  BNP 1,200.9* 1,134.8* 706.0*    ProBNP (last 3 results)  Recent Labs  12/10/16 1431  PROBNP 21,500*     D-Dimer No results for input(s): DDIMER in the last 72 hours.  Hemoglobin A1C No results for input(s): HGBA1C in the last 72 hours. Fasting Lipid Panel No results for input(s): CHOL, HDL, LDLCALC, TRIG, CHOLHDL, LDLDIRECT in the last 72 hours. Thyroid Function Tests No results for input(s): TSH, T4TOTAL, T3FREE, THYROIDAB in the last 72 hours.  Invalid input(s): FREET3  Other results:     Imaging/Studies:  No results found.    Medications:     Scheduled Medications: . atorvastatin  40 mg Oral QHS  . Chlorhexidine Gluconate Cloth  6 each Topical Daily  . Gerhardt's butt cream   Topical BID  . insulin aspart  0-15 Units Subcutaneous TID WC  . insulin detemir  4 Units Subcutaneous QHS  . levothyroxine  112.5 mcg Oral QAC breakfast  . mouth rinse  15 mL Mouth Rinse QID  . senna-docusate  1 tablet Oral Daily  . sodium chloride flush  10-40 mL Intracatheter Q12H  . sodium chloride flush  3 mL  Intravenous Q12H    Infusions: . amiodarone 30 mg/hr (01/05/17 1100)  . bivalirudin (ANGIOMAX) infusion 0.5 mg/mL (Non-ACS indications) 0.02 mg/kg/hr (01/05/17 1100)  . milrinone 0.375 mcg/kg/min (01/05/17 1100)  . norepinephrine (LEVOPHED) Adult infusion 26.027 mcg/min (01/05/17 1100)  . dialysis replacement fluid (prismasate) 400 mL/hr at 01/04/17 1639  . dialysis replacement fluid (prismasate) 200 mL/hr at 01/04/17 0408  . dialysate (PRISMASATE) 1,500 mL/hr at 01/05/17 0806    PRN Medications: Place/Maintain arterial line **AND** sodium chloride, acetaminophen (TYLENOL) oral liquid 160 mg/5 mL, ALPRAZolam, fentaNYL (SUBLIMAZE) injection, guaiFENesin-dextromethorphan, hydrOXYzine, levalbuterol, ondansetron **OR** ondansetron (ZOFRAN) IV, sodium chloride, sodium chloride flush   Assessment/Plan   [name] is a 71 y.o. female CAD s/p CABG, chronic combined CHF, moderate aortic stenosis, DM, HLD, PVD followed by Dr. Donnetta Hutching s/p L foot transmetatarsal amputation, hypothyroidism, syncope, CKD stage III-IV, and Iron/B12 deficiency.  Admitted for edema and poor urine output.  Found to be hypotensive and in cardiogenic shock. Also found to be in [place] Afib. ? Infection origin/early + UA, UCx with E. Coli and CXR concerning for PNA.  1. Acute on chronic biventricular CHF: Ischemic cardiomyopathy.  Echo with admission with EF 15%, severely decreased RV systolic function, mid to moderate aortic stenosis.  CVP 18 today.  Pt remains markedly volume overloaded on exam. CVVH started 3/17  Co-ox 53% today. Continue norepi 26 mcg +  milrinone 0.375 mcg/kg/min.    - I have reviewed Renal, Dr. Aundra Dubin and Palliative Care notes. I had a long discussion with her about what she will feel like after stopping CVVHD. She is worried about uremia and I told her with a BUN of 9 it will be days before she feels that. Breathing likely to get worse first - We have decided to stop CVVHD at 5pm today. I discussed  this wither her and her family as well as bedside RN  - Would stop drawing most labs as well 2. Atrial fibrillation versus atypical flutter: She appears to be in atrial flutter this morning.   - Will continue amio gtt for now but will consider stopping soon  - On bival due to concern over HIT 3. AKI on CKD stage 3: Baseline creatinine 1.4-1.6.   -Suspect component of cardiorenal syndrome but also ATN with hypotensive episode initially.   -Now end-stage. She is now on CVVH per nephrology to see if volume unloading will help her renal function.  No urine output. She is not a good long-term HD candidate.  We are withdrawing CVVHD today. Will see if she  has any kidney recovery  4. CAD: No chest pain. Last cath in 12/12 with patent LIMA to LAD with occlusion of distal LAD; High grade stenosis of nondominant RCA; Signif disease of prox LCx; SVG to L PDA occluded, SVG to OM patent).  Attempted PTCA of proximal LCx but failed 10/2011. No good  revascularization options.   - No ischemia. Stop all nonessential meds. 5. PAD: Extensive, especially in the left leg.  She is s/p left transmetatarsal amputation.  This has led to her limited mobility.  - No change to current plan.  6. UTI: E coli. Has completed aztreonam course.    7. DNR/DNI - now switching to comfort care  I had long discussion with her and her family. She is comfortable with her situation but felt she needs to get a few more things done. Will stop CVVHD at 5pm. Stop all non-essential meds and labs.   The patient is critically ill with multiple organ systems failure and requires high complexity decision making for assessment and support, frequent evaluation and titration of therapies, application of advanced monitoring technologies and extensive interpretation of multiple databases.   Critical Care Time devoted to patient care services described in this note is 45 Minutes. This time reflects time of care of this signee, Dr. Pierre Bali who  personally examined the patient and determined the plan of care. This critical care time does not reflect procedure time, teaching time or supervisory time of our PA/NP/resident but could involve care discussion and coordination time.   Length of Stay: 12  Glori Bickers, MD  01/05/2017, 1:07 PM  Advanced Heart Failure Team Pager [phone] (M-F; 7a - 4p)  Please contact [facility] for night-coverage after hours (4p -7a ) and weekends

## 2017-01-05 NOTE — Progress Notes (Signed)
Chart reviewed. See Dr. Damaris Schooner note and goals of care discussion. Plan is to stop CVVHD this afternoon. Dr. Jeffie Pollock to gradually titrate off drips today. I stopped by to introduce myself to pt and family. She is coping well. PMT to stay involved with symptom mgt going forward. Romona Curls, ANP

## 2017-01-05 NOTE — Progress Notes (Signed)
[place] for Bivalirudin Indication: atrial fibrillation/Rule-out HIT/CRRT  Allergies  Allergen Reactions  . Cephalexin Swelling and Rash  . Ciprofloxacin Swelling and Rash  . Zyvox [Linezolid] Other (See Comments)    Thrombocytopenia developed to 50k after several weeks of therapy  . Adhesive [Tape] Other (See Comments)    "old [employer] adhesive tape; takes my skin off; can use paper tape"  . Bactrim [Sulfamethoxazole-Trimethoprim] Other (See Comments)    Hyperkalemia (elevated potassium) and Increased creatinine  . Daptomycin Other (See Comments)    Had elevated CPK on therapy, not clear that this was due to cubicin  . Vancomycin Other (See Comments)    ? If this played role in her Acute kidney injury    Patient Measurements: Height: 5\' 3"  (160 cm) Weight: 156 lb 15.5 oz (71.2 kg) IBW/kg (Calculated) : 52.4 Heparin Dosing Weight: ~70kg  Vital Signs: Temp: 97.6 F (36.4 C) (03/24 0400) Temp Source: Axillary (03/24 0400) Pulse Rate: 120 (03/24 0700)  Labs:  Recent Labs  01/03/17 0539  01/04/17 0430 01/04/17 1600 01/05/17 0500  HGB 13.0  --  13.0  --   --   HCT 40.5  --  39.7  --   --   PLT 77*  --  96*  --   --   APTT 73*  --  66*  --  62*  CREATININE 1.42*  < > 1.37* 1.25* 1.22*  < > = values in this interval not displayed.  Estimated Creatinine Clearance: 40 mL/min (A) (by C-G formula based on SCr of 1.22 mg/dL (H)).   Assessment: 70yof previously on heparin for afib.  Heparin was on hold on 3/15 and 3/17 with recurrent epistaxis and hematuria. Resumed anticoagulation with bivalirudin on 3/18 due to concern of HIT (heparin antibody pending; Called [facility] today and should be getting results by fax later today). Patient is on CRRT and noted DNI. Likely comfort care soon.  -aPTT= 62, no bleeding issues noted.   Goal of Therapy:  Goal aPTT: 50-70 per Dr. Aundra Dubin due to bleeding risk Monitor platelets by  anticoagulation protocol: Yes   Plan:  -Continue bivalirudin 0.02mg /kg/hr -Daily aPTT and CBC -CRRT stopped   Erin Hearing PharmD., BCPS Clinical Pharmacist Pager [phone] 01/05/2017 9:22 AM

## 2017-01-05 NOTE — Progress Notes (Signed)
Patient ID: [name], female   DOB: [birth year], 71 y.o.   MRN: [id number]  [facility] Progress Note   Assessment/ Plan:   1. Acute kidney injury on chronic kidney disease stage III: cardiorenal syndrome with superimposed ATN with relative hypotension/cardiogenic shock.  started on CRRT 3/17 after refractoriness to diuretics/inotropic support. Have achieved ultrafiltration  - is not a candidate for chronic HD given cardiomyopathy- pt and family aware.  Plan was to stop CRRT today- with no UOP not optomistic about long term survival off CRRT - all are aware 2. Acute exacerbation of congestive heart failure: History of ischemic cardiomyopathy with EF of 15%. Started on CRRT after failed response to diuretic/inotropes. Have been able to remove fluid but also pressors -  3. Hyponatremia: Secondary to CHF exacerbation and acute on chronic renal insufficiency- somewhat better with ultrafiltration. 4. New onset atrial fibrillation/flutter: Remains rate controlled and currently on amiodarone drip. 5. [place]- controlled- all 4 K - will give phos 6. Anemia- not an issue 7. Dispo- palliative care to get involved- to stop CRRT today-  8. Platelets down- heparin stopped on bivalirudin- rebounded some   Nursing can stop CRRT at pts request some time today - maybe watch UOP and labs the following 12 hours- then if no UOP would stop labs and go full comfort- I am going to follow at a distance- call with any questions   Subjective:   on CRRT- is running well- plan is to withdraw CRRT today   Objective:   BP (!) 85/47   Pulse (!) 120   Temp 97.6 F (36.4 C) (Axillary)   Resp (!) 25   Ht 5\' 3"  (1.6 m)   Wt 71.2 kg (156 lb 15.5 oz)   SpO2 98%   BMI 27.81 kg/m   Intake/Output Summary (Last 24 hours) at 01/05/17 0742 Last data filed at 01/05/17 0700  Gross per 24 hour  Intake          1940.39 ml  Output             3860 ml  Net         -1919.61 ml   Weight change: 0.2 kg  (7.1 oz)  Physical Exam: Gen: resting so did not wake...left IJ vascath placed 3/17 CVS: Pulse irregularly irregular tachycardia, 3/6 holosystolic murmur, elevated JVD Resp: Coarse breath sounds anteriorly Abd: Soft, obese, nontender Ext: 2+ lower extremity edema with Ace wrap over her extremities - painful and amputations  Imaging: No results found.  Labs: BMET  Recent Labs Lab 01/02/17 0330 01/02/17 1641 01/03/17 0539 01/03/17 1600 01/04/17 0430 01/04/17 1600 01/05/17 0500  NA 132* 131* 132* 130* 130* 132* 133*  K 4.0 4.3 4.3 4.2 4.3 4.5 4.1  CL 99* 99* 97* 100* 98* 100* 99*  CO2 25 23 21* 22 22 21* 25  GLUCOSE 192* 187* 197* 207* 185* 244* 253*  BUN 10 9 9 10 10 11 9   CREATININE 1.30* 1.27* 1.42* 1.39* 1.37* 1.25* 1.22*  CALCIUM 8.4* 8.5* 8.5* 8.4* 8.5* 8.4* 8.3*  PHOS 2.2* 2.9 3.1 2.2* 2.2* 2.9 2.2*   CBC  Recent Labs Lab 01/01/17 0505 01/02/17 0330 01/03/17 0539 01/04/17 0430  WBC 9.7 9.1 7.8 7.7  HGB 13.3 12.8 13.0 13.0  HCT 40.4 38.9 40.5 39.7  MCV 90.2 90.9 92.9 91.7  PLT 58* 57* 77* 96*   Medications:    . atorvastatin  40 mg Oral QHS  . Chlorhexidine Gluconate Cloth  6 each Topical  Daily  . Gerhardt's butt cream   Topical BID  . insulin aspart  0-15 Units Subcutaneous TID WC  . insulin detemir  4 Units Subcutaneous QHS  . levothyroxine  112.5 mcg Oral QAC breakfast  . mouth rinse  15 mL Mouth Rinse QID  . senna-docusate  1 tablet Oral Daily  . sodium chloride flush  10-40 mL Intracatheter Q12H  . sodium chloride flush  3 mL Intravenous Q12H   Adlai Sinning A  01/05/2017, 7:42 AM

## 2017-01-06 LAB — APTT: aPTT: 60 seconds — ABNORMAL HIGH (ref 24–36)

## 2017-01-06 MED ORDER — SODIUM CHLORIDE 0.9 % IV SOLN
2.0000 mg/h | INTRAVENOUS | Status: DC
  Administered 2017-01-06: 2 mg/h via INTRAVENOUS
  Filled 2017-01-06: qty 5
  Filled 2017-01-06: qty 10

## 2017-01-06 MED ORDER — MIDAZOLAM HCL 2 MG/2ML IJ SOLN: 2.0000 mg | INTRAMUSCULAR | Status: DC | PRN

## 2017-01-07 ENCOUNTER — Ambulatory Visit: Payer: Medicare Other | Admitting: Internal Medicine

## 2017-01-07 NOTE — Progress Notes (Signed)
01/07/2017 12:02 AM   Patient expired at 2330 February 03, 2017. Patient was a DNR on morphine gtt for comfort and levophed. Patient is asystole on the monitor, without heart tones, breath sounds or palpable pulses. [facility] notified [phone]).[facility] notified. Patient family at bedside.Family declined [facility] services. Comfort provided to family and patient. On call fellow notified of patient's death. See postmortem flowsheet.   Mick Sell RN

## 2017-01-11 ENCOUNTER — Telehealth (HOSPITAL_COMMUNITY): Payer: Self-pay | Admitting: *Deleted

## 2017-01-11 NOTE — Telephone Encounter (Signed)
Death certificate mailed.  

## 2017-01-13 NOTE — Discharge Summary (Signed)
  Advanced Heart Failure Death Summary  Death Summary   Patient ID: [name] MRN: [id number], DOB/AGE: Mar 21, 1946 71 y.o. Admit date: 01/01/2017 D/C date:     Feb 05, 2017    Primary Discharge Diagnoses:  1. A/C Biventricular CHF- ECHO 12/26/15 Biventricular failure  2. Cardiogenic Shock - evidence of multiorgan failure  3. Afib versus A flutter 4. AKI on CKD- Creatinine peaked 3.2. Started CVVHD on 12/29/16 5.  CAD 6. PAD 7. UTI 8. DNR/DNI  9. A/C Respiratory Failure- requiring Bipap for hypoxia  10. Thrombocytopenia-plts down to 58.   Hospital Course:  ELYCE ZOLLINGER a 71 y.o.femaleCAD s/p CABG, chronic combined CHF, moderate aortic stenosis, DM, HLD, PVD followed by Dr. Donnetta Hutching s/p L foot transmetatarsal amputation, hypothyroidism, syncope, CKD stage III-IV, and Iron/B12 deficiency. Admitted for edema and poor urine output. Found to be hypotensive and in cardiogenic shock. Also found to be in [place] Afib. ? Infection origin/early + UA, UCx with E. Coliand CXR concerning for PNA. Hospital course was complicate by A/C Biventricular Heart Failure, cardiogenic shock, and AKI requiring CVVHD. Unable to maintain BP without dual pressors therefore she was not candidate for outpatient hemodialysis. Despite every effort she continued to decline.  Palliative Care consulted and she transitioned to comfort care. On  March 25th she passed quietly with family at that bed side.    1. Acute on chronic biventricular CHF: Ischemic cardiomyopathy. Echo on admit showed severely reduced EF15%, severely decreased RV systolic function, mid to moderate aortic stenosis. Initial mixed venous saturation was 49% so dobutamine was started. Central line was placed with elevated CVP. Attempted to diurese with IV lasix but creatinine continued to rise. Due to low mixed venous saturation dobutamine was stopped and milrinone was started. Norepi was also added for persistent hypotension. She remained on  milrinone + norepi until she transitioned to comfort care.  Unable to effectively diurese with IV lasix so CVVHD started.  -2. Atrial fibrillation versus atypical flutter:  -Was on amio drip + bival. Taken off heparin due to thrombocytopenia. Platelets dropped to 58.  3. AKI on CKD stage 3: Creatinine peaked  3.2   -Suspect component of cardiorenal syndrome but also ATN with hypotensive episode initially.   Nephrology consulted. Trialysis catheter placed on march 17th. CVVHD started March 17th.  Unfortunately she required dual pressors  -CVVHD stopped 3/24 with transition to comfort.  4. CAD: No chest pain. Last cath in 12/12 with patent LIMA to LAD with occlusion of distal LAD; High grade stenosis of nondominant RCA; Signif disease of prox LCx; SVG to L PDA occluded, SVG to OM patent).  Attempted PTCA of proximal LCx but failed 10/2011. No good  revascularization options.  - No chest pain. . 5. PAD: Extensive, especially in the left leg. She is s/p left transmetatarsal amputation. This has led to her limited mobility. Required intermittent pain medication.  6. UTI: E coli. [name] Received  aztreonam course.    7. Thrombocytopenia- taken off heparin. Platelets dropped to 58. HIT was negative.  7. DNR/DNI 8. Acute on Chronic Respiratory Failure- Required intermittent BiPap for sats < 88%.     Duration of Discharge Encounter: Greater than 35 minutes   Signed, Darrick Grinder, NP 01/08/2017, 4:56 PM

## 2017-01-13 NOTE — Progress Notes (Signed)
Patient ID: [name], female   DOB: 12-12-1945, 71 y.o.   MRN: [id number]     Advanced Heart Failure Rounding Note  Referring Physician: Dr Elsworth Soho Primary Physician:Dr. Carlota Raspberry Primary Cardiologist:  Dr. Harrington Challenger  Reason for Consultation: Cardiogenic shock   Subjective:    CVVHD stopped last night.  Mildly dyspneic but otherwise ok. Milrinone off. Norepi down to 23 but SBP in 70s. Mild leg pain at times. No confusion. Mildly anxious   Objective:   Weight Range: 71.2 kg (156 lb 15.5 oz) Body mass index is 27.81 kg/m.   Vital Signs:   Temp:  [97.6 F (36.4 C)-98.9 F (37.2 C)] 98.9 F (37.2 C) (03/25 0800) Pulse Rate:  [94-117] 99 (03/25 1300) Resp:  [0-25] 18 (03/25 1300) BP: (74-83)/(44) 83/44 (03/25 0800) SpO2:  [85 %-97 %] 94 % (03/25 1300) Arterial Line BP: (73-98)/(37-53) 74/37 (03/25 1300) Last BM Date: 12/30/16  Weight change: Filed Weights   01/03/17 0700 01/04/17 0500 01/05/17 0500  Weight: 70.1 kg (154 lb 8.7 oz) 71 kg (156 lb 8.4 oz) 71.2 kg (156 lb 15.5 oz)    Intake/Output:   Intake/Output Summary (Last 24 hours) at January 28, 2017 1326 Last data filed at 01/28/17 1300  Gross per 24 hour  Intake           1280.3 ml  Output                0 ml  Net           1280.3 ml     Physical Exam: General:  Elderly. NAD. Sitting up in bed Neck: Supple . JVP to jaw Left trialysis cath Cor: PMI laterally displaced. Tachy irregular. 2/6 SEM RUSB  Lungs: clear anteriorly  Abdomen: + distended, nontender. No bruits or masses. Good BS Extremities: no cyanosis, clubbing, rash. L foot transmetatarsal amputation. R and LLE ace wraps. R Knee foam dressing . L Knee eschar. 2+ edema Neuro: alert & orientedx3, cranial nerves grossly intact. moves all 4 extremities w/o difficulty. Normal affect. Occasioanlly anxious  Telemetry: Atrial flutter 100-110 Personally reviewed     Labs: CBC  Recent Labs  01/04/17 0430  WBC 7.7  HGB 13.0  HCT 39.7  MCV 91.7  PLT 96*    Basic Metabolic Panel  Recent Labs  01/04/17 0430 01/04/17 1600 01/05/17 0500  NA 130* 132* 133*  K 4.3 4.5 4.1  CL 98* 100* 99*  CO2 22 21* 25  GLUCOSE 185* 244* 253*  BUN 10 11 9   CREATININE 1.37* 1.25* 1.22*  CALCIUM 8.5* 8.4* 8.3*  MG 2.4  --  2.5*  PHOS 2.2* 2.9 2.2*   Liver Function Tests  Recent Labs  01/04/17 1600 01/05/17 0500  ALBUMIN 2.4* 2.0*   No results for input(s): LIPASE, AMYLASE in the last 72 hours. Cardiac Enzymes No results for input(s): CKTOTAL, CKMB, CKMBINDEX, TROPONINI in the last 72 hours.  BNP: BNP (last 3 results)  Recent Labs  10/12/16 1026 12/10/16 1431 01/11/2017 2016  BNP 1,200.9* 1,134.8* 706.0*    ProBNP (last 3 results)  Recent Labs  12/10/16 1431  PROBNP 21,500*     D-Dimer No results for input(s): DDIMER in the last 72 hours. Hemoglobin A1C No results for input(s): HGBA1C in the last 72 hours. Fasting Lipid Panel No results for input(s): CHOL, HDL, LDLCALC, TRIG, CHOLHDL, LDLDIRECT in the last 72 hours. Thyroid Function Tests No results for input(s): TSH, T4TOTAL, T3FREE, THYROIDAB in the last 72 hours.  Invalid input(s): FREET3  Other results:     Imaging/Studies:  No results found.    Medications:     Scheduled Medications: . Chlorhexidine Gluconate Cloth  6 each Topical Daily  . Gerhardt's butt cream   Topical BID  . insulin detemir  4 Units Subcutaneous QHS  . mouth rinse  15 mL Mouth Rinse QID  . senna-docusate  1 tablet Oral Daily  . sodium chloride flush  10-40 mL Intracatheter Q12H  . sodium chloride flush  3 mL Intravenous Q12H    Infusions: . amiodarone 30 mg/hr (22-Jan-2017 1300)  . bivalirudin (ANGIOMAX) infusion 0.5 mg/mL (Non-ACS indications) 0.02 mg/kg/hr (01/22/2017 1300)  . norepinephrine (LEVOPHED) Adult infusion 21 mcg/min (01/22/17 1321)    PRN Medications: Place/Maintain arterial line **AND** sodium chloride, acetaminophen (TYLENOL) oral liquid 160 mg/5 mL, ALPRAZolam,  fentaNYL (SUBLIMAZE) injection, guaiFENesin-dextromethorphan, hydrOXYzine, levalbuterol, ondansetron **OR** ondansetron (ZOFRAN) IV, sodium chloride, sodium chloride flush   Assessment/Plan   [name] is a 71 y.o. female CAD s/p CABG, chronic combined CHF, moderate aortic stenosis, DM, HLD, PVD followed by Dr. Donnetta Hutching s/p L foot transmetatarsal amputation, hypothyroidism, syncope, CKD stage III-IV, and Iron/B12 deficiency.  Admitted for edema and poor urine output.  Found to be hypotensive and in cardiogenic shock. Also found to be in [place] Afib. ? Infection origin/early + UA, UCx with E. Coli and CXR concerning for PNA.  1. Acute on chronic biventricular CHF: Ischemic cardiomyopathy.  Echo with admission with EF 15%, severely decreased RV systolic function, mid to moderate aortic stenosis.  CVP 18 today.  Pt remains markedly volume overloaded on exam. CVVH started 3/17 stopped 3/24 Co-ox 53% today. Continue norepi 26 mcg +  milrinone 0.375 mcg/kg/min.    - Have now switched to comfort care. Milrinone and CVVHD off. Continuing norepi for comfort. As she gets more lethargic we will wean. She and her family are ok with this,  - Start morphine gtt at 2 for comfort. Can titrate as needed. D/w Hospice team 2. Atrial fibrillation versus atypical flutter: She appears to be in atrial flutter this morning.   - Will stop amio gtt and bival with switch to comfort care. 3. AKI on CKD stage 3: Baseline creatinine 1.4-1.6.   -Suspect component of cardiorenal syndrome but also ATN with hypotensive episode initially.   -Now end-stage. -CVVHD stopped 3/24. No plans to restart  4. CAD: No chest pain. Last cath in 12/12 with patent LIMA to LAD with occlusion of distal LAD; High grade stenosis of nondominant RCA; Signif disease of prox LCx; SVG to L PDA occluded, SVG to OM patent).  Attempted PTCA of proximal LCx but failed 10/2011. No good  revascularization options.   - Denies CP.  Stop all nonessential  meds. 5. PAD: Extensive, especially in the left leg.  She is s/p left transmetatarsal amputation.  This has led to her limited mobility.  - No change to current plan.  6. UTI: E coli. Has completed aztreonam course.    7. DNR/DNI - She is now comfort care. Milrinone stopped. Start morphine for dyspnea relief. Wean norepi as tolerated. Remove trialysis cath and a-line.   I had another discussion with her and her family. She seems more comfortable with notion of impending death as long as she is comfortable. Family agrees with changes as above as well.   The patient is critically ill with multiple organ systems failure and requires high complexity decision making for assessment and support, frequent evaluation and titration of therapies, application of advanced monitoring  technologies and extensive interpretation of multiple databases.   Critical Care Time devoted to patient care services described in this note is 35 Minutes. This time reflects time of care of this signee, Dr. Pierre Bali who personally examined the patient and determined the plan of care. This critical care time does not reflect procedure time, teaching time or supervisory time of our PA/NP/resident but could involve care discussion and coordination time.   Length of Stay: 13  Glori Bickers, MD  02-04-17, 1:26 PM  Advanced Heart Failure Team Pager [phone] (M-F; [place])  Please contact [facility] for night-coverage after hours (4p -7a ) and weekends

## 2017-01-13 NOTE — Progress Notes (Addendum)
Chart reviewed, spoke to bedside RN. Patient is more lethargic today. She is currently sleeping. No family at the bedside although she's had family with her usually around the clock. CRRT stopped yesterday. She remains on levophed 25 mics. RN shares that even titrating down by 1 g she has dropped her blood pressure from systolic in the 32W to systolic in the 03L. We'll staff with Dr. Jeffie Pollock palliative to see how palliative medicine  can help patient during this difficult time. Romona Curls,. ANP

## 2017-01-13 NOTE — Progress Notes (Signed)
[place] for Bivalirudin Indication: atrial fibrillation/Rule-out HIT/CRRT  Allergies  Allergen Reactions  . Cephalexin Swelling and Rash  . Ciprofloxacin Swelling and Rash  . Zyvox [Linezolid] Other (See Comments)    Thrombocytopenia developed to 50k after several weeks of therapy  . Adhesive [Tape] Other (See Comments)    "old [employer] adhesive tape; takes my skin off; can use paper tape"  . Bactrim [Sulfamethoxazole-Trimethoprim] Other (See Comments)    Hyperkalemia (elevated potassium) and Increased creatinine  . Daptomycin Other (See Comments)    Had elevated CPK on therapy, not clear that this was due to cubicin  . Vancomycin Other (See Comments)    ? If this played role in her Acute kidney injury    Patient Measurements: Height: 5\' 3"  (160 cm) Weight: 156 lb 15.5 oz (71.2 kg) IBW/kg (Calculated) : 52.4 Heparin Dosing Weight: ~70kg  Vital Signs: Temp: 98.9 F (37.2 C) (03/25 0800) Temp Source: Axillary (03/25 0800) BP: 83/44 (03/25 0800) Pulse Rate: 107 (03/25 0800)  Labs:  Recent Labs  01/04/17 0430 01/04/17 1600 01/05/17 0500 01/07/2017 0500  HGB 13.0  --   --   --   HCT 39.7  --   --   --   PLT 96*  --   --   --   APTT 66*  --  62* 60*  CREATININE 1.37* 1.25* 1.22*  --     Estimated Creatinine Clearance: 40 mL/min (A) (by C-G formula based on SCr of 1.22 mg/dL (H)).   Assessment: 70yof previously on heparin for afib.  Heparin was on hold on 3/15 and 3/17 with recurrent epistaxis and hematuria. Resumed anticoagulation with bivalirudin on 3/18 due to concern of HIT.   CRRT stopped yesterday. Plan to wean off drips slowly and provide comfort measures.  -aPTT= 60, no bleeding issues noted.   Goal of Therapy:  Goal aPTT: 50-70 per Dr. Aundra Dubin due to bleeding risk Monitor platelets by anticoagulation protocol: Yes   Plan:  -Continue bivalirudin 0.02mg /kg/hr -Daily aPTT -CRRT stopped   Erin Hearing  PharmD., BCPS Clinical Pharmacist Pager [phone] 01-07-17 9:21 AM

## 2017-01-13 DEATH — deceased

## 2017-01-17 LAB — HEPARIN INDUCED PLATELET AB (HIT ANTIBODY)

## 2017-01-25 ENCOUNTER — Other Ambulatory Visit: Payer: Self-pay | Admitting: Nurse Practitioner

## 2017-03-26 ENCOUNTER — Encounter (HOSPITAL_COMMUNITY): Payer: Self-pay

## 2017-03-26 ENCOUNTER — Encounter (HOSPITAL_COMMUNITY): Payer: Medicare Other

## 2017-03-26 ENCOUNTER — Ambulatory Visit: Payer: Medicare Other | Admitting: Vascular Surgery

## 2017-04-04 ENCOUNTER — Ambulatory Visit: Payer: Medicare Other

## 2017-07-04 ENCOUNTER — Ambulatory Visit: Payer: Medicare Other

## 2017-07-05 ENCOUNTER — Other Ambulatory Visit: Payer: Medicare Other

## 2017-07-05 ENCOUNTER — Ambulatory Visit: Payer: Medicare Other

## 2017-07-05 ENCOUNTER — Ambulatory Visit: Payer: Medicare Other | Admitting: Hematology and Oncology

## 2018-02-23 IMAGING — DX DG CHEST 1V PORT
1 series · 1 of 1 positions shown · non-contrast
Comparison: 12/26/2016.

CLINICAL DATA: Cough.

EXAM:
PORTABLE CHEST 1 VIEW

[chest ap]
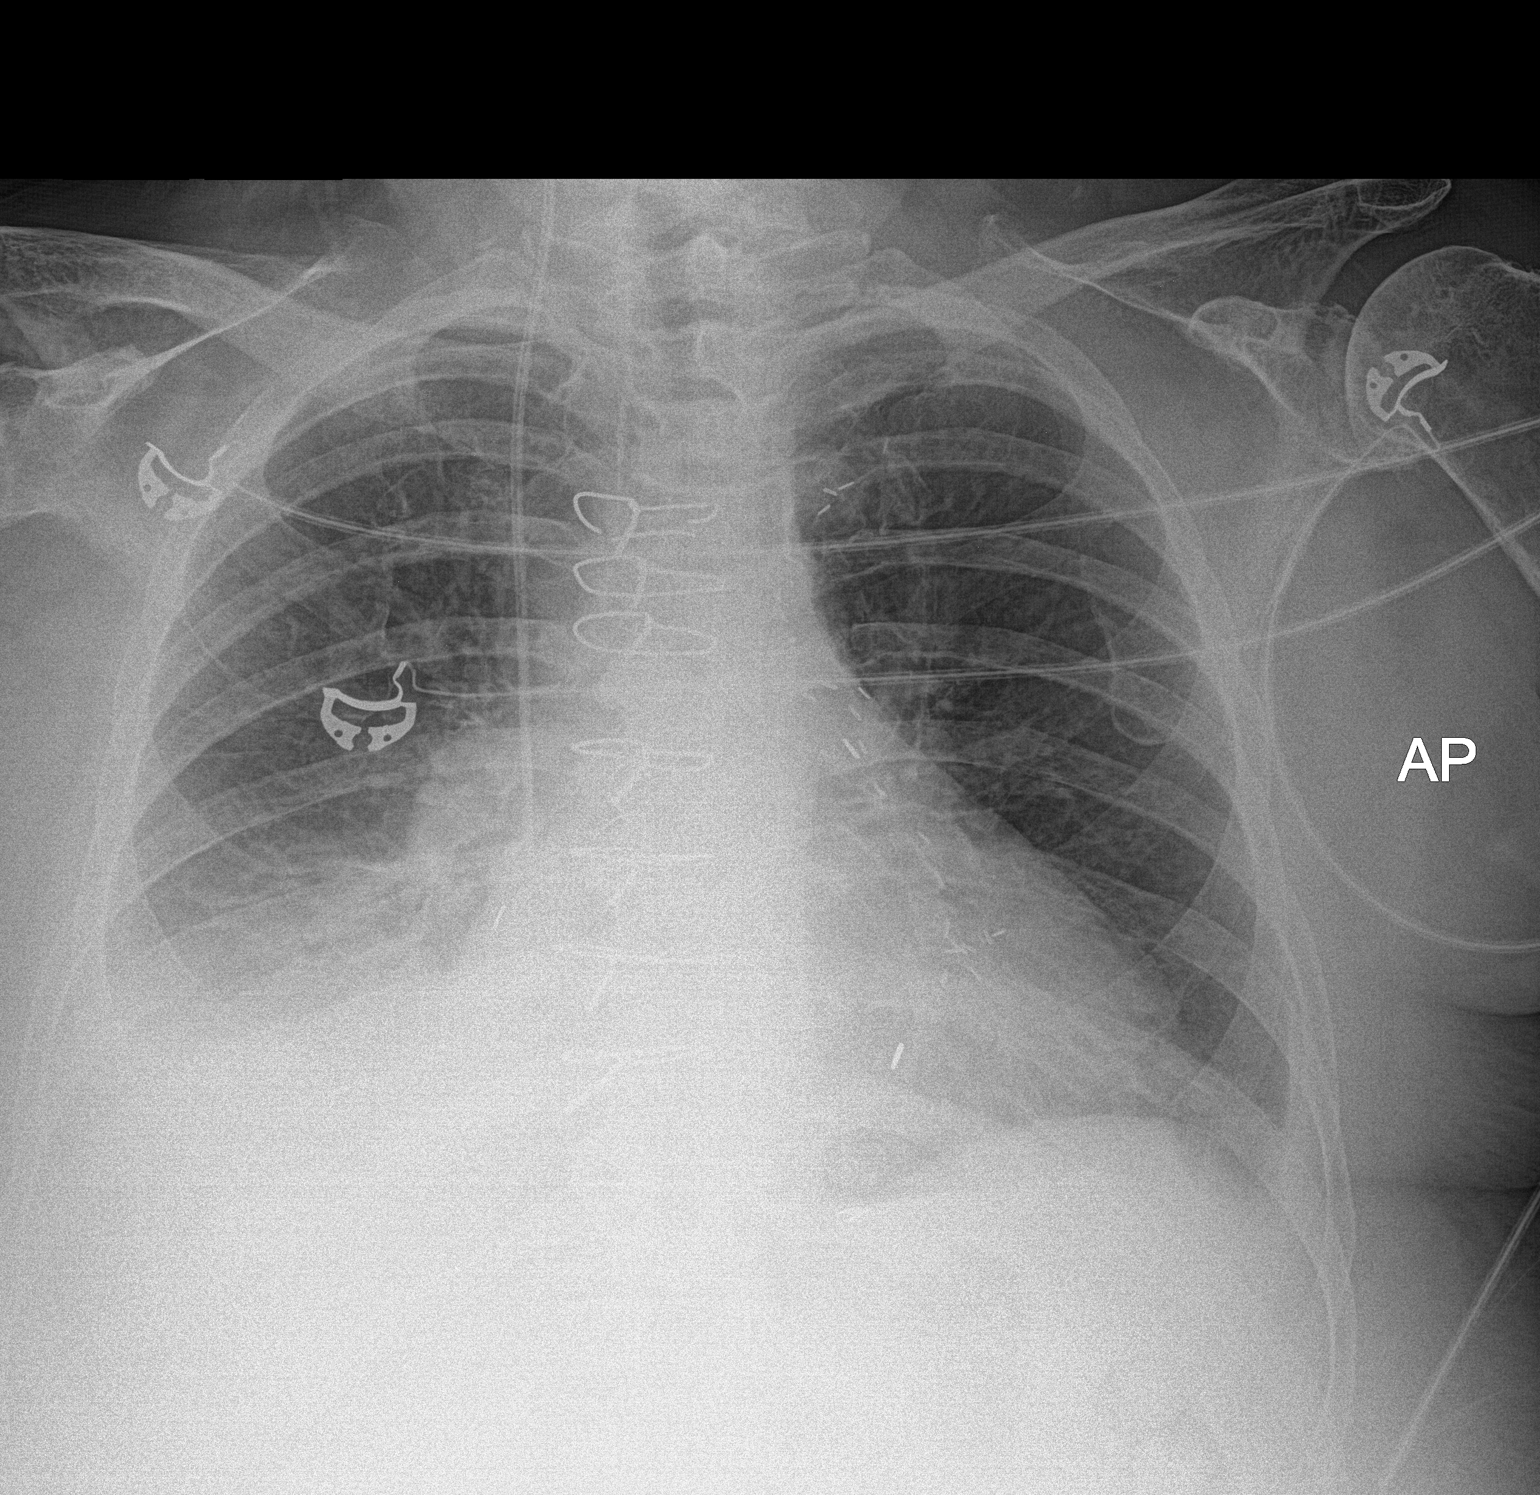

[1 of 1 positions shown; findings below may reference images not displayed]

FINDINGS: Right IJ line noted in stable position. Prior CABG. Stable
cardiomegaly. Interim improvement of diffuse right lung infiltrate
and/or edema. Stable pleural effusions. No pneumothorax.
IMPRESSION: 1. Right IJ line stable position.

2. Prior CABG. Stable cardiomegaly. Interim partial clearing of
diffuse right lung infiltrate/edema. Stable bilateral pleural
effusions .

## 2018-02-25 IMAGING — CR DG CHEST 1V PORT
1 series · 1 of 1 positions shown · non-contrast
Comparison: Chest x-ray 12/27/2016.

CLINICAL DATA: 70-year-old female with history of hypoxia.

EXAM:
PORTABLE CHEST 1 VIEW

[AP]
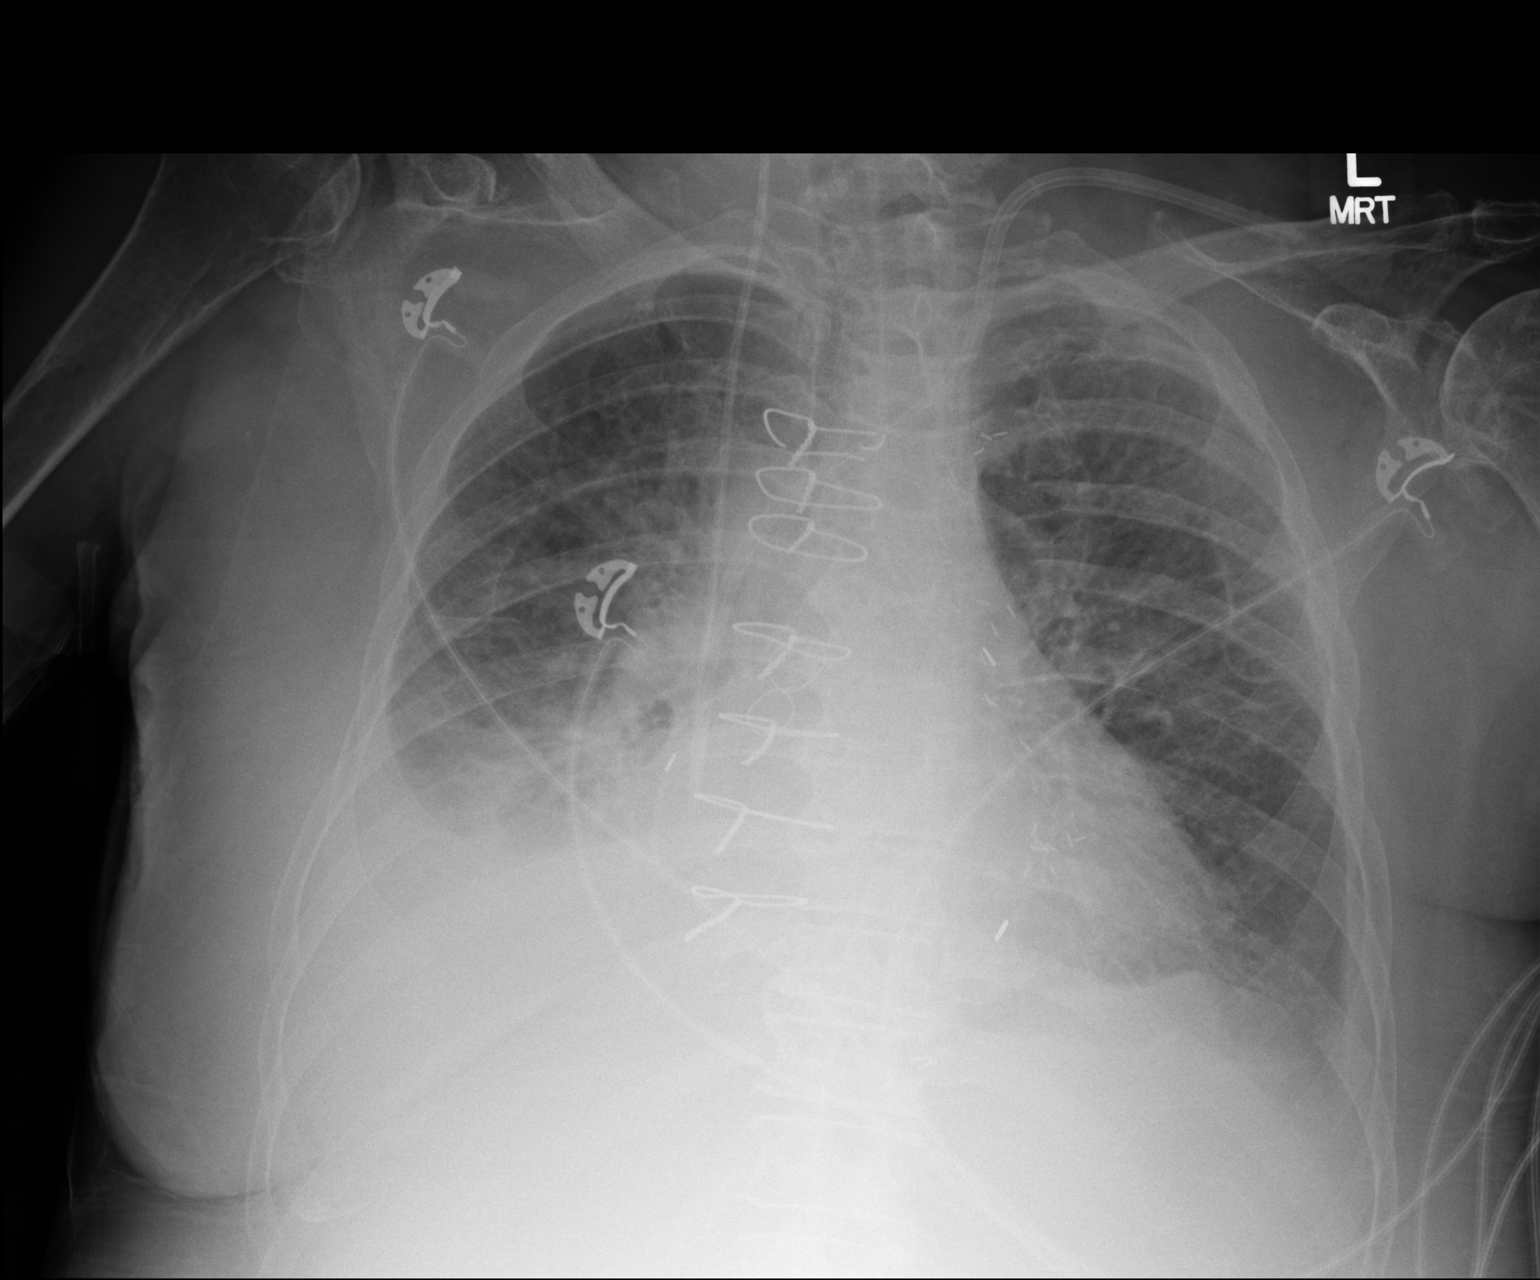

[1 of 1 positions shown; findings below may reference images not displayed]

FINDINGS: There is a right-sided internal jugular central venous catheter with
tip terminating in the right atrium. Left internal jugular Vas-Cath
with tip terminating in the superior aspect of the right atrium.
Lung volumes are low. No pneumothorax. Small left and moderate right
pleural effusions, similar to the prior study. Bibasilar opacities
may reflect areas of atelectasis and/or airspace consolidation.
There is cephalization of the pulmonary vasculature and slight
indistinctness of the interstitial markings suggestive of mild
pulmonary edema. Mild cardiomegaly. Upper mediastinal contours are
slightly distorted by patient's rotation on the right. Status post
median sternotomy for CABG, including [REDACTED].
IMPRESSION: 1. Support apparatus, as above.
2. The appearance of the chest suggests mild congestive heart
failure, as above.
3. Moderate right and small left pleural effusions with dependent
opacities which may reflect areas of atelectasis and/or airspace
consolidation.
